# Patient Record
Sex: Female | Born: 1935 | Race: White | Hispanic: No | Smoking: Former smoker
Health system: Southern US, Community
[De-identification: ages and names within clinical notes are randomized; demographics above are authoritative.]

## PROBLEM LIST (undated history)

## (undated) DIAGNOSIS — F32A Depression, unspecified: Secondary | ICD-10-CM

## (undated) DIAGNOSIS — M1990 Unspecified osteoarthritis, unspecified site: Secondary | ICD-10-CM

## (undated) DIAGNOSIS — I10 Essential (primary) hypertension: Secondary | ICD-10-CM

## (undated) DIAGNOSIS — B029 Zoster without complications: Secondary | ICD-10-CM

## (undated) DIAGNOSIS — K56600 Partial intestinal obstruction, unspecified as to cause: Secondary | ICD-10-CM

## (undated) DIAGNOSIS — I639 Cerebral infarction, unspecified: Secondary | ICD-10-CM

## (undated) DIAGNOSIS — E785 Hyperlipidemia, unspecified: Secondary | ICD-10-CM

## (undated) DIAGNOSIS — F329 Major depressive disorder, single episode, unspecified: Secondary | ICD-10-CM

## (undated) DIAGNOSIS — E890 Postprocedural hypothyroidism: Secondary | ICD-10-CM

## (undated) DIAGNOSIS — R0789 Other chest pain: Secondary | ICD-10-CM

## (undated) DIAGNOSIS — R519 Headache, unspecified: Secondary | ICD-10-CM

## (undated) DIAGNOSIS — I499 Cardiac arrhythmia, unspecified: Secondary | ICD-10-CM

## (undated) DIAGNOSIS — Z9009 Acquired absence of other part of head and neck: Secondary | ICD-10-CM

## (undated) DIAGNOSIS — R0600 Dyspnea, unspecified: Secondary | ICD-10-CM

## (undated) DIAGNOSIS — K219 Gastro-esophageal reflux disease without esophagitis: Secondary | ICD-10-CM

## (undated) DIAGNOSIS — K56609 Unspecified intestinal obstruction, unspecified as to partial versus complete obstruction: Secondary | ICD-10-CM

## (undated) DIAGNOSIS — C50919 Malignant neoplasm of unspecified site of unspecified female breast: Secondary | ICD-10-CM

## (undated) DIAGNOSIS — Z972 Presence of dental prosthetic device (complete) (partial): Secondary | ICD-10-CM

## (undated) DIAGNOSIS — R51 Headache: Secondary | ICD-10-CM

## (undated) DIAGNOSIS — C4491 Basal cell carcinoma of skin, unspecified: Secondary | ICD-10-CM

## (undated) HISTORY — DX: Cerebral infarction, unspecified: I63.9

## (undated) HISTORY — DX: Dyspnea, unspecified: R06.00

## (undated) HISTORY — DX: Partial intestinal obstruction, unspecified as to cause: K56.600

## (undated) HISTORY — DX: Malignant neoplasm of unspecified site of unspecified female breast: C50.919

## (undated) HISTORY — DX: Acquired absence of other part of head and neck: Z90.09

## (undated) HISTORY — DX: Depression, unspecified: F32.A

## (undated) HISTORY — DX: Major depressive disorder, single episode, unspecified: F32.9

## (undated) HISTORY — DX: Other chest pain: R07.89

## (undated) HISTORY — DX: Gastro-esophageal reflux disease without esophagitis: K21.9

## (undated) HISTORY — DX: Unspecified intestinal obstruction, unspecified as to partial versus complete obstruction: K56.609

## (undated) HISTORY — PX: NECK SURGERY: SHX720

## (undated) HISTORY — DX: Unspecified osteoarthritis, unspecified site: M19.90

## (undated) HISTORY — PX: ABDOMINAL HYSTERECTOMY: SHX81

## (undated) HISTORY — DX: Essential (primary) hypertension: I10

## (undated) HISTORY — DX: Basal cell carcinoma of skin, unspecified: C44.91

## (undated) HISTORY — DX: Cardiac arrhythmia, unspecified: I49.9

## (undated) HISTORY — DX: Postprocedural hypothyroidism: E89.0

## (undated) HISTORY — DX: Zoster without complications: B02.9

## (undated) HISTORY — PX: BREAST SURGERY: SHX581

## (undated) HISTORY — DX: Hyperlipidemia, unspecified: E78.5

---

## 1978-07-09 HISTORY — PX: MASTECTOMY: SHX3

## 2002-07-09 LAB — HM COLONOSCOPY

## 2004-04-11 ENCOUNTER — Ambulatory Visit: Payer: Self-pay | Admitting: Pain Medicine

## 2004-04-19 ENCOUNTER — Ambulatory Visit: Payer: Self-pay | Admitting: Pain Medicine

## 2004-04-24 ENCOUNTER — Ambulatory Visit: Payer: Self-pay | Admitting: General Surgery

## 2004-06-08 ENCOUNTER — Ambulatory Visit: Payer: Self-pay | Admitting: Pain Medicine

## 2004-06-19 ENCOUNTER — Ambulatory Visit: Payer: Self-pay | Admitting: Pain Medicine

## 2004-07-21 ENCOUNTER — Inpatient Hospital Stay: Payer: Self-pay | Admitting: Internal Medicine

## 2005-04-03 ENCOUNTER — Ambulatory Visit: Payer: Self-pay | Admitting: Internal Medicine

## 2005-04-06 ENCOUNTER — Ambulatory Visit: Payer: Self-pay | Admitting: Internal Medicine

## 2005-05-09 ENCOUNTER — Ambulatory Visit: Payer: Self-pay | Admitting: Internal Medicine

## 2005-06-11 ENCOUNTER — Ambulatory Visit: Payer: Self-pay | Admitting: Internal Medicine

## 2005-07-11 ENCOUNTER — Ambulatory Visit: Payer: Self-pay | Admitting: Internal Medicine

## 2005-08-02 ENCOUNTER — Ambulatory Visit: Payer: Self-pay | Admitting: Pain Medicine

## 2005-08-06 ENCOUNTER — Ambulatory Visit: Payer: Self-pay | Admitting: Pain Medicine

## 2005-09-10 ENCOUNTER — Ambulatory Visit: Payer: Self-pay | Admitting: Internal Medicine

## 2005-09-13 ENCOUNTER — Ambulatory Visit: Payer: Self-pay | Admitting: Pain Medicine

## 2005-09-19 ENCOUNTER — Ambulatory Visit: Payer: Self-pay | Admitting: Pain Medicine

## 2005-10-03 ENCOUNTER — Ambulatory Visit: Payer: Self-pay | Admitting: Internal Medicine

## 2005-11-26 ENCOUNTER — Other Ambulatory Visit: Admission: RE | Admit: 2005-11-26 | Discharge: 2005-11-26 | Payer: Self-pay | Admitting: Internal Medicine

## 2005-11-26 ENCOUNTER — Ambulatory Visit: Payer: Self-pay | Admitting: Internal Medicine

## 2006-01-24 ENCOUNTER — Ambulatory Visit: Payer: Self-pay | Admitting: Internal Medicine

## 2006-02-12 ENCOUNTER — Ambulatory Visit: Payer: Self-pay | Admitting: Internal Medicine

## 2006-03-14 ENCOUNTER — Ambulatory Visit: Payer: Self-pay | Admitting: Internal Medicine

## 2006-04-15 ENCOUNTER — Ambulatory Visit: Payer: Self-pay | Admitting: Internal Medicine

## 2006-04-23 ENCOUNTER — Ambulatory Visit: Payer: Self-pay | Admitting: Internal Medicine

## 2006-05-13 ENCOUNTER — Ambulatory Visit: Payer: Self-pay | Admitting: Internal Medicine

## 2006-07-15 ENCOUNTER — Ambulatory Visit: Payer: Self-pay | Admitting: Internal Medicine

## 2006-08-15 ENCOUNTER — Ambulatory Visit: Payer: Self-pay | Admitting: Internal Medicine

## 2006-08-28 DIAGNOSIS — E785 Hyperlipidemia, unspecified: Secondary | ICD-10-CM | POA: Insufficient documentation

## 2006-08-28 DIAGNOSIS — M1990 Unspecified osteoarthritis, unspecified site: Secondary | ICD-10-CM | POA: Insufficient documentation

## 2006-08-28 DIAGNOSIS — Z853 Personal history of malignant neoplasm of breast: Secondary | ICD-10-CM

## 2006-08-28 DIAGNOSIS — K219 Gastro-esophageal reflux disease without esophagitis: Secondary | ICD-10-CM

## 2006-09-06 ENCOUNTER — Encounter: Payer: Self-pay | Admitting: Internal Medicine

## 2006-10-01 ENCOUNTER — Ambulatory Visit: Payer: Self-pay | Admitting: General Surgery

## 2006-10-15 ENCOUNTER — Encounter: Payer: Self-pay | Admitting: Internal Medicine

## 2006-10-31 ENCOUNTER — Ambulatory Visit: Payer: Self-pay | Admitting: Pain Medicine

## 2006-11-04 ENCOUNTER — Ambulatory Visit: Payer: Self-pay | Admitting: Pain Medicine

## 2006-11-04 ENCOUNTER — Encounter: Payer: Self-pay | Admitting: Internal Medicine

## 2006-11-14 ENCOUNTER — Ambulatory Visit: Payer: Self-pay | Admitting: Internal Medicine

## 2006-11-14 DIAGNOSIS — G479 Sleep disorder, unspecified: Secondary | ICD-10-CM | POA: Insufficient documentation

## 2006-11-14 DIAGNOSIS — H409 Unspecified glaucoma: Secondary | ICD-10-CM | POA: Insufficient documentation

## 2006-12-01 ENCOUNTER — Emergency Department (HOSPITAL_COMMUNITY): Admission: EM | Admit: 2006-12-01 | Discharge: 2006-12-01 | Payer: Self-pay | Admitting: Emergency Medicine

## 2006-12-06 ENCOUNTER — Ambulatory Visit: Payer: Self-pay | Admitting: Internal Medicine

## 2006-12-09 ENCOUNTER — Encounter: Payer: Self-pay | Admitting: Internal Medicine

## 2006-12-09 ENCOUNTER — Emergency Department (HOSPITAL_COMMUNITY): Admission: EM | Admit: 2006-12-09 | Discharge: 2006-12-09 | Payer: Self-pay | Admitting: Emergency Medicine

## 2006-12-10 ENCOUNTER — Ambulatory Visit: Payer: Self-pay | Admitting: Internal Medicine

## 2006-12-25 ENCOUNTER — Telehealth: Payer: Self-pay | Admitting: Family Medicine

## 2007-01-09 ENCOUNTER — Encounter (INDEPENDENT_AMBULATORY_CARE_PROVIDER_SITE_OTHER): Payer: Self-pay | Admitting: *Deleted

## 2007-01-13 ENCOUNTER — Encounter (INDEPENDENT_AMBULATORY_CARE_PROVIDER_SITE_OTHER): Payer: Self-pay | Admitting: *Deleted

## 2007-01-13 ENCOUNTER — Ambulatory Visit: Payer: Self-pay | Admitting: Pain Medicine

## 2007-01-20 ENCOUNTER — Ambulatory Visit: Payer: Self-pay | Admitting: Internal Medicine

## 2007-01-28 ENCOUNTER — Encounter: Payer: Self-pay | Admitting: Internal Medicine

## 2007-01-28 ENCOUNTER — Ambulatory Visit: Payer: Self-pay | Admitting: Pain Medicine

## 2007-02-03 ENCOUNTER — Encounter: Payer: Self-pay | Admitting: Internal Medicine

## 2007-02-03 ENCOUNTER — Ambulatory Visit: Payer: Self-pay | Admitting: Pain Medicine

## 2007-02-13 ENCOUNTER — Ambulatory Visit: Payer: Self-pay | Admitting: Pain Medicine

## 2007-02-13 ENCOUNTER — Encounter: Payer: Self-pay | Admitting: Internal Medicine

## 2007-02-14 ENCOUNTER — Telehealth: Payer: Self-pay | Admitting: Family Medicine

## 2007-02-14 ENCOUNTER — Inpatient Hospital Stay (HOSPITAL_COMMUNITY): Admission: EM | Admit: 2007-02-14 | Discharge: 2007-02-19 | Payer: Self-pay | Admitting: Emergency Medicine

## 2007-02-14 ENCOUNTER — Ambulatory Visit: Payer: Self-pay | Admitting: Internal Medicine

## 2007-02-16 ENCOUNTER — Encounter: Payer: Self-pay | Admitting: Internal Medicine

## 2007-02-18 ENCOUNTER — Encounter: Payer: Self-pay | Admitting: Internal Medicine

## 2007-02-19 ENCOUNTER — Encounter: Payer: Self-pay | Admitting: Internal Medicine

## 2007-02-28 ENCOUNTER — Ambulatory Visit: Payer: Self-pay | Admitting: Internal Medicine

## 2007-03-21 ENCOUNTER — Ambulatory Visit: Payer: Self-pay | Admitting: Internal Medicine

## 2007-03-31 ENCOUNTER — Ambulatory Visit: Payer: Self-pay | Admitting: Internal Medicine

## 2007-04-08 ENCOUNTER — Encounter: Payer: Self-pay | Admitting: Internal Medicine

## 2007-04-11 ENCOUNTER — Encounter: Payer: Self-pay | Admitting: Internal Medicine

## 2007-04-22 ENCOUNTER — Ambulatory Visit: Payer: Self-pay | Admitting: Internal Medicine

## 2007-04-23 ENCOUNTER — Encounter: Payer: Self-pay | Admitting: Internal Medicine

## 2007-05-20 ENCOUNTER — Telehealth (INDEPENDENT_AMBULATORY_CARE_PROVIDER_SITE_OTHER): Payer: Self-pay | Admitting: *Deleted

## 2007-06-03 ENCOUNTER — Encounter (INDEPENDENT_AMBULATORY_CARE_PROVIDER_SITE_OTHER): Payer: Self-pay | Admitting: *Deleted

## 2007-06-03 ENCOUNTER — Ambulatory Visit: Payer: Self-pay | Admitting: Internal Medicine

## 2007-06-11 ENCOUNTER — Telehealth (INDEPENDENT_AMBULATORY_CARE_PROVIDER_SITE_OTHER): Payer: Self-pay | Admitting: *Deleted

## 2007-06-26 ENCOUNTER — Ambulatory Visit: Payer: Self-pay | Admitting: Internal Medicine

## 2007-06-26 LAB — CONVERTED CEMR LAB
Blood in Urine, dipstick: NEGATIVE
Glucose, Urine, Semiquant: NEGATIVE
Ketones, urine, test strip: NEGATIVE
Nitrite: NEGATIVE
Protein, U semiquant: NEGATIVE

## 2007-07-14 ENCOUNTER — Telehealth (INDEPENDENT_AMBULATORY_CARE_PROVIDER_SITE_OTHER): Payer: Self-pay | Admitting: *Deleted

## 2007-07-17 ENCOUNTER — Ambulatory Visit: Payer: Self-pay | Admitting: Internal Medicine

## 2007-07-30 ENCOUNTER — Telehealth (INDEPENDENT_AMBULATORY_CARE_PROVIDER_SITE_OTHER): Payer: Self-pay | Admitting: *Deleted

## 2007-08-07 ENCOUNTER — Telehealth: Payer: Self-pay | Admitting: Internal Medicine

## 2007-08-11 ENCOUNTER — Telehealth (INDEPENDENT_AMBULATORY_CARE_PROVIDER_SITE_OTHER): Payer: Self-pay | Admitting: *Deleted

## 2007-08-14 ENCOUNTER — Encounter: Payer: Self-pay | Admitting: Internal Medicine

## 2007-08-18 ENCOUNTER — Telehealth (INDEPENDENT_AMBULATORY_CARE_PROVIDER_SITE_OTHER): Payer: Self-pay | Admitting: *Deleted

## 2007-09-05 ENCOUNTER — Telehealth (INDEPENDENT_AMBULATORY_CARE_PROVIDER_SITE_OTHER): Payer: Self-pay | Admitting: *Deleted

## 2007-09-15 ENCOUNTER — Encounter: Payer: Self-pay | Admitting: Internal Medicine

## 2007-09-16 ENCOUNTER — Telehealth: Payer: Self-pay | Admitting: Internal Medicine

## 2007-09-22 ENCOUNTER — Telehealth (INDEPENDENT_AMBULATORY_CARE_PROVIDER_SITE_OTHER): Payer: Self-pay | Admitting: *Deleted

## 2007-09-26 ENCOUNTER — Ambulatory Visit: Payer: Self-pay | Admitting: Internal Medicine

## 2007-11-19 ENCOUNTER — Telehealth: Payer: Self-pay | Admitting: Internal Medicine

## 2007-11-21 ENCOUNTER — Telehealth (INDEPENDENT_AMBULATORY_CARE_PROVIDER_SITE_OTHER): Payer: Self-pay | Admitting: *Deleted

## 2007-11-25 ENCOUNTER — Ambulatory Visit: Payer: Self-pay | Admitting: Internal Medicine

## 2007-11-25 DIAGNOSIS — B019 Varicella without complication: Secondary | ICD-10-CM | POA: Insufficient documentation

## 2007-11-25 DIAGNOSIS — B029 Zoster without complications: Secondary | ICD-10-CM

## 2007-12-09 ENCOUNTER — Telehealth (INDEPENDENT_AMBULATORY_CARE_PROVIDER_SITE_OTHER): Payer: Self-pay | Admitting: *Deleted

## 2007-12-24 ENCOUNTER — Telehealth: Payer: Self-pay | Admitting: Internal Medicine

## 2008-01-12 ENCOUNTER — Ambulatory Visit: Payer: Self-pay | Admitting: Internal Medicine

## 2008-01-13 ENCOUNTER — Telehealth (INDEPENDENT_AMBULATORY_CARE_PROVIDER_SITE_OTHER): Payer: Self-pay | Admitting: *Deleted

## 2008-01-14 LAB — CONVERTED CEMR LAB
ALT: 12 units/L (ref 0–35)
Albumin: 4 g/dL (ref 3.5–5.2)
Alkaline Phosphatase: 65 units/L (ref 39–117)
Basophils Relative: 3.1 % — ABNORMAL HIGH (ref 0.0–1.0)
Chloride: 101 meq/L (ref 96–112)
Creatinine, Ser: 1 mg/dL (ref 0.4–1.2)
Eosinophils Absolute: 0.1 10*3/uL (ref 0.0–0.7)
Eosinophils Relative: 1.6 % (ref 0.0–5.0)
GFR calc Af Amer: 69 mL/min
GFR calc non Af Amer: 57 mL/min
HCT: 37.5 % (ref 36.0–46.0)
MCV: 90.1 fL (ref 78.0–100.0)
Monocytes Absolute: 1 10*3/uL (ref 0.1–1.0)
Phosphorus: 5.3 mg/dL — ABNORMAL HIGH (ref 2.3–4.6)
Potassium: 4.4 meq/L (ref 3.5–5.1)
RBC: 4.17 M/uL (ref 3.87–5.11)
Total Protein: 7 g/dL (ref 6.0–8.3)
WBC: 8.1 10*3/uL (ref 4.5–10.5)

## 2008-01-21 ENCOUNTER — Encounter: Payer: Self-pay | Admitting: Internal Medicine

## 2008-03-08 ENCOUNTER — Encounter: Payer: Self-pay | Admitting: Internal Medicine

## 2008-03-12 ENCOUNTER — Ambulatory Visit: Payer: Self-pay | Admitting: Internal Medicine

## 2008-04-07 ENCOUNTER — Ambulatory Visit: Payer: Self-pay | Admitting: Internal Medicine

## 2008-04-20 ENCOUNTER — Telehealth: Payer: Self-pay | Admitting: Internal Medicine

## 2008-05-04 ENCOUNTER — Ambulatory Visit: Payer: Self-pay | Admitting: Family Medicine

## 2008-05-04 LAB — CONVERTED CEMR LAB
Bilirubin Urine: NEGATIVE
Urobilinogen, UA: 0.2
WBC Urine, dipstick: NEGATIVE
pH: 5.5

## 2008-05-06 ENCOUNTER — Encounter: Payer: Self-pay | Admitting: Family Medicine

## 2008-05-14 ENCOUNTER — Ambulatory Visit: Payer: Self-pay | Admitting: Internal Medicine

## 2008-05-14 ENCOUNTER — Telehealth: Payer: Self-pay | Admitting: Internal Medicine

## 2008-06-16 ENCOUNTER — Ambulatory Visit: Payer: Self-pay | Admitting: Internal Medicine

## 2008-06-16 DIAGNOSIS — F39 Unspecified mood [affective] disorder: Secondary | ICD-10-CM

## 2008-07-12 ENCOUNTER — Telehealth: Payer: Self-pay | Admitting: Internal Medicine

## 2008-07-26 ENCOUNTER — Telehealth: Payer: Self-pay | Admitting: Internal Medicine

## 2008-08-25 ENCOUNTER — Telehealth: Payer: Self-pay | Admitting: Internal Medicine

## 2008-09-24 ENCOUNTER — Ambulatory Visit: Payer: Self-pay | Admitting: Internal Medicine

## 2008-10-01 ENCOUNTER — Encounter: Payer: Self-pay | Admitting: Internal Medicine

## 2008-10-25 ENCOUNTER — Telehealth: Payer: Self-pay | Admitting: Internal Medicine

## 2008-11-09 ENCOUNTER — Telehealth: Payer: Self-pay | Admitting: Internal Medicine

## 2008-11-09 ENCOUNTER — Ambulatory Visit: Payer: Self-pay | Admitting: Internal Medicine

## 2008-11-09 LAB — CONVERTED CEMR LAB
Blood in Urine, dipstick: NEGATIVE
Glucose, Urine, Semiquant: NEGATIVE
Nitrite: NEGATIVE
Urobilinogen, UA: 0.2
WBC Urine, dipstick: NEGATIVE
pH: 6

## 2008-12-27 ENCOUNTER — Ambulatory Visit: Payer: Self-pay | Admitting: Internal Medicine

## 2008-12-30 ENCOUNTER — Telehealth (INDEPENDENT_AMBULATORY_CARE_PROVIDER_SITE_OTHER): Payer: Self-pay | Admitting: *Deleted

## 2008-12-31 ENCOUNTER — Telehealth (INDEPENDENT_AMBULATORY_CARE_PROVIDER_SITE_OTHER): Payer: Self-pay | Admitting: *Deleted

## 2009-02-17 ENCOUNTER — Ambulatory Visit: Payer: Self-pay | Admitting: Internal Medicine

## 2009-02-28 ENCOUNTER — Telehealth: Payer: Self-pay | Admitting: Internal Medicine

## 2009-04-04 ENCOUNTER — Ambulatory Visit: Payer: Self-pay | Admitting: Internal Medicine

## 2009-04-04 DIAGNOSIS — G629 Polyneuropathy, unspecified: Secondary | ICD-10-CM | POA: Insufficient documentation

## 2009-04-06 LAB — CONVERTED CEMR LAB
ALT: 16 units/L (ref 0–35)
AST: 20 units/L (ref 0–37)
Albumin: 3.8 g/dL (ref 3.5–5.2)
BUN: 17 mg/dL (ref 6–23)
Basophils Absolute: 0 10*3/uL (ref 0.0–0.1)
CO2: 31 meq/L (ref 19–32)
Calcium: 8.9 mg/dL (ref 8.4–10.5)
Chloride: 108 meq/L (ref 96–112)
Creatinine, Ser: 0.9 mg/dL (ref 0.4–1.2)
HCT: 37.8 % (ref 36.0–46.0)
Hemoglobin: 12.8 g/dL (ref 12.0–15.0)
Lymphs Abs: 2 10*3/uL (ref 0.7–4.0)
MCHC: 33.7 g/dL (ref 30.0–36.0)
MCV: 89.2 fL (ref 78.0–100.0)
Monocytes Absolute: 0.6 10*3/uL (ref 0.1–1.0)
Monocytes Relative: 9.7 % (ref 3.0–12.0)
Neutro Abs: 3.4 10*3/uL (ref 1.4–7.7)
Platelets: 175 10*3/uL (ref 150.0–400.0)
RDW: 13.3 % (ref 11.5–14.6)
TSH: 1.24 microintl units/mL (ref 0.35–5.50)
Total Bilirubin: 0.6 mg/dL (ref 0.3–1.2)
Vitamin B-12: 185 pg/mL — ABNORMAL LOW (ref 211–911)

## 2009-05-03 ENCOUNTER — Ambulatory Visit: Payer: Self-pay | Admitting: Internal Medicine

## 2009-05-03 DIAGNOSIS — E538 Deficiency of other specified B group vitamins: Secondary | ICD-10-CM

## 2009-05-04 LAB — CONVERTED CEMR LAB: Vitamin B-12: 568 pg/mL (ref 211–911)

## 2009-05-19 ENCOUNTER — Encounter: Payer: Self-pay | Admitting: Internal Medicine

## 2009-05-31 ENCOUNTER — Ambulatory Visit: Payer: Self-pay | Admitting: Internal Medicine

## 2009-06-03 ENCOUNTER — Ambulatory Visit: Payer: Self-pay | Admitting: Internal Medicine

## 2009-07-06 ENCOUNTER — Ambulatory Visit: Payer: Self-pay | Admitting: Internal Medicine

## 2009-07-07 ENCOUNTER — Telehealth: Payer: Self-pay | Admitting: Internal Medicine

## 2009-07-07 LAB — CONVERTED CEMR LAB
AST: 18 units/L (ref 0–37)
Albumin: 3.9 g/dL (ref 3.5–5.2)
Alkaline Phosphatase: 56 units/L (ref 39–117)
BUN: 13 mg/dL (ref 6–23)
Basophils Absolute: 0.1 10*3/uL (ref 0.0–0.1)
Bilirubin, Direct: 0 mg/dL (ref 0.0–0.3)
Chloride: 102 meq/L (ref 96–112)
Eosinophils Absolute: 0.1 10*3/uL (ref 0.0–0.7)
GFR calc non Af Amer: 85.59 mL/min (ref >60)
Glucose, Bld: 89 mg/dL (ref 70–99)
Hemoglobin: 12.1 g/dL (ref 12.0–15.0)
Lymphocytes Relative: 35.2 % (ref 12.0–46.0)
MCHC: 33.3 g/dL (ref 30.0–36.0)
Monocytes Relative: 6.9 % (ref 3.0–12.0)
Neutrophils Relative %: 56 % (ref 43.0–77.0)
Phosphorus: 4.2 mg/dL (ref 2.3–4.6)
Potassium: 4.1 meq/L (ref 3.5–5.1)
RDW: 13.4 % (ref 11.5–14.6)
Total Bilirubin: 0.7 mg/dL (ref 0.3–1.2)

## 2009-09-19 ENCOUNTER — Ambulatory Visit: Payer: Self-pay | Admitting: Internal Medicine

## 2009-09-19 ENCOUNTER — Telehealth: Payer: Self-pay | Admitting: Internal Medicine

## 2009-10-05 ENCOUNTER — Ambulatory Visit: Payer: Self-pay | Admitting: Internal Medicine

## 2009-10-14 ENCOUNTER — Telehealth: Payer: Self-pay | Admitting: Internal Medicine

## 2009-10-27 ENCOUNTER — Telehealth: Payer: Self-pay | Admitting: Internal Medicine

## 2009-11-03 ENCOUNTER — Ambulatory Visit: Payer: Self-pay | Admitting: Internal Medicine

## 2009-11-04 ENCOUNTER — Ambulatory Visit: Payer: Self-pay | Admitting: Cardiology

## 2009-11-09 ENCOUNTER — Encounter: Payer: Self-pay | Admitting: Cardiovascular Disease

## 2009-11-11 ENCOUNTER — Encounter: Payer: Self-pay | Admitting: Cardiology

## 2009-11-14 ENCOUNTER — Telehealth: Payer: Self-pay | Admitting: Internal Medicine

## 2009-11-21 ENCOUNTER — Telehealth (INDEPENDENT_AMBULATORY_CARE_PROVIDER_SITE_OTHER): Payer: Self-pay | Admitting: *Deleted

## 2009-11-22 ENCOUNTER — Encounter: Payer: Self-pay | Admitting: Cardiology

## 2009-11-22 ENCOUNTER — Encounter (HOSPITAL_COMMUNITY): Admission: RE | Admit: 2009-11-22 | Discharge: 2009-11-22 | Payer: Self-pay | Admitting: Cardiology

## 2009-11-22 ENCOUNTER — Telehealth (INDEPENDENT_AMBULATORY_CARE_PROVIDER_SITE_OTHER): Payer: Self-pay | Admitting: *Deleted

## 2009-11-22 ENCOUNTER — Encounter: Payer: Self-pay | Admitting: Internal Medicine

## 2009-11-22 ENCOUNTER — Telehealth: Payer: Self-pay | Admitting: Internal Medicine

## 2009-11-22 ENCOUNTER — Ambulatory Visit: Payer: Self-pay

## 2009-11-22 ENCOUNTER — Ambulatory Visit (HOSPITAL_COMMUNITY): Admission: RE | Admit: 2009-11-22 | Discharge: 2009-11-22 | Payer: Self-pay | Admitting: Cardiology

## 2009-11-22 ENCOUNTER — Emergency Department (HOSPITAL_COMMUNITY): Admission: EM | Admit: 2009-11-22 | Discharge: 2009-11-22 | Payer: Self-pay | Admitting: Family Medicine

## 2009-11-22 ENCOUNTER — Ambulatory Visit: Payer: Self-pay | Admitting: Cardiology

## 2009-11-24 ENCOUNTER — Ambulatory Visit: Payer: Self-pay | Admitting: Cardiology

## 2009-12-07 ENCOUNTER — Telehealth: Payer: Self-pay | Admitting: Internal Medicine

## 2009-12-20 ENCOUNTER — Telehealth: Payer: Self-pay | Admitting: Internal Medicine

## 2009-12-28 ENCOUNTER — Ambulatory Visit: Payer: Self-pay | Admitting: Internal Medicine

## 2010-01-02 ENCOUNTER — Telehealth: Payer: Self-pay | Admitting: Internal Medicine

## 2010-01-25 ENCOUNTER — Telehealth: Payer: Self-pay | Admitting: Internal Medicine

## 2010-02-07 ENCOUNTER — Telehealth: Payer: Self-pay | Admitting: Family Medicine

## 2010-03-11 ENCOUNTER — Telehealth: Payer: Self-pay | Admitting: Internal Medicine

## 2010-03-14 ENCOUNTER — Encounter: Payer: Self-pay | Admitting: Internal Medicine

## 2010-03-16 ENCOUNTER — Telehealth: Payer: Self-pay | Admitting: Internal Medicine

## 2010-04-06 ENCOUNTER — Ambulatory Visit: Payer: Self-pay | Admitting: Internal Medicine

## 2010-04-10 ENCOUNTER — Ambulatory Visit: Payer: Self-pay | Admitting: Internal Medicine

## 2010-04-10 DIAGNOSIS — R609 Edema, unspecified: Secondary | ICD-10-CM

## 2010-05-22 ENCOUNTER — Telehealth: Payer: Self-pay | Admitting: Internal Medicine

## 2010-05-31 ENCOUNTER — Ambulatory Visit: Payer: Self-pay | Admitting: Family Medicine

## 2010-06-06 ENCOUNTER — Telehealth: Payer: Self-pay | Admitting: Internal Medicine

## 2010-07-04 ENCOUNTER — Telehealth (INDEPENDENT_AMBULATORY_CARE_PROVIDER_SITE_OTHER): Payer: Self-pay | Admitting: *Deleted

## 2010-07-17 ENCOUNTER — Telehealth: Payer: Self-pay | Admitting: Internal Medicine

## 2010-07-17 ENCOUNTER — Other Ambulatory Visit: Payer: Self-pay | Admitting: Internal Medicine

## 2010-07-17 ENCOUNTER — Ambulatory Visit
Admission: RE | Admit: 2010-07-17 | Discharge: 2010-07-17 | Payer: Self-pay | Source: Home / Self Care | Attending: Internal Medicine | Admitting: Internal Medicine

## 2010-07-17 LAB — CBC WITH DIFFERENTIAL/PLATELET
Basophils Absolute: 0 10*3/uL (ref 0.0–0.1)
Basophils Relative: 0.4 % (ref 0.0–3.0)
Eosinophils Absolute: 0.1 10*3/uL (ref 0.0–0.7)
Eosinophils Relative: 1.4 % (ref 0.0–5.0)
HCT: 36.4 % (ref 36.0–46.0)
Hemoglobin: 12 g/dL (ref 12.0–15.0)
Lymphocytes Relative: 29.4 % (ref 12.0–46.0)
Lymphs Abs: 1.5 10*3/uL (ref 0.7–4.0)
MCHC: 33.1 g/dL (ref 30.0–36.0)
MCV: 88 fl (ref 78.0–100.0)
Monocytes Absolute: 0.4 10*3/uL (ref 0.1–1.0)
Monocytes Relative: 8.1 % (ref 3.0–12.0)
Neutro Abs: 3.1 10*3/uL (ref 1.4–7.7)
Neutrophils Relative %: 60.7 % (ref 43.0–77.0)
Platelets: 202 10*3/uL (ref 150.0–400.0)
RBC: 4.14 Mil/uL (ref 3.87–5.11)
RDW: 14.2 % (ref 11.5–14.6)
WBC: 5.1 10*3/uL (ref 4.5–10.5)

## 2010-07-17 LAB — RENAL FUNCTION PANEL
Albumin: 3.4 g/dL — ABNORMAL LOW (ref 3.5–5.2)
BUN: 19 mg/dL (ref 6–23)
CO2: 27 mEq/L (ref 19–32)
Calcium: 8.9 mg/dL (ref 8.4–10.5)
Chloride: 106 mEq/L (ref 96–112)
Creatinine, Ser: 0.8 mg/dL (ref 0.4–1.2)
GFR: 70.13 mL/min (ref >60.00)
Glucose, Bld: 91 mg/dL (ref 70–99)
Phosphorus: 4 mg/dL (ref 2.3–4.6)
Potassium: 4.9 mEq/L (ref 3.5–5.1)
Sodium: 142 mEq/L (ref 135–145)

## 2010-07-17 LAB — HEPATIC FUNCTION PANEL
ALT: 8 U/L (ref 0–35)
AST: 18 U/L (ref 0–37)
Albumin: 3.4 g/dL — ABNORMAL LOW (ref 3.5–5.2)
Alkaline Phosphatase: 71 U/L (ref 39–117)
Bilirubin, Direct: 0.1 mg/dL (ref 0.0–0.3)
Total Bilirubin: 0.5 mg/dL (ref 0.3–1.2)
Total Protein: 6.8 g/dL (ref 6.0–8.3)

## 2010-07-17 LAB — TSH: TSH: 1.46 u[IU]/mL (ref 0.35–5.50)

## 2010-08-08 NOTE — Progress Notes (Signed)
Summary: fentanyl patch   Phone Note Call from Patient Call back at Home Phone [phone]   Caller: Patient Call For: Cindee Salt MD Summary of Call: Patient calling to let you know that she is not using the fentanyl patches. She says that they irritate her skin and make her itch really bad. She wants to know if she could get somehting else called in to [employer] [facility].  Initial call taken by: Melody Comas,  January 25, 2010 10:50 AM  Follow-up for Phone Call        will need to go back on long acting narcotic pills I would recommend morphine now instead of the oxycontin since it is a generic and probably safer  Rx written for her--she will have to pick it up Follow-up by: Cindee Salt MD,  January 25, 2010 1:46 PM  Additional Follow-up for Phone Call Additional follow up Details #1::        Patient notified as instructed by telephone. Rx up front and ready for pickup. Additional Follow-up by: Sydell Axon LPN,  January 25, 2010 3:22 PM    New/Updated Medications: MORPHINE SULFATE CR 60 MG XR12H-TAB (MORPHINE SULFATE) 1 tab by mouth two times a day for chronic pain Prescriptions: MORPHINE SULFATE CR 60 MG XR12H-TAB (MORPHINE SULFATE) 1 tab by mouth two times a day for chronic pain  #60 x 0   Entered and Authorized by:   Cindee Salt MD   Signed by:   Cindee Salt MD on 01/25/2010   Method used:   Print then Give to Patient   RxID:   [id number]

## 2010-08-08 NOTE — Progress Notes (Signed)
Summary: refill request for vistaril  Phone Note Refill Request   Refills Requested: Medication #1:  HYDROXYZINE HCL 25 MG TABS 1-2 tablet by mouth three times a day   Last Refilled: 02/07/2010 [facility]  Initial call taken by: Lowella Petties CMA,  March 11, 2010 12:30 PM  Follow-up for Phone Call        refill done but okay to make it #11 refills Follow-up by: Cindee Salt MD,  March 14, 2010 8:09 AM  Additional Follow-up for Phone Call Additional follow up Details #1::        Rx faxed to pharmacy Additional Follow-up by: DeShannon Smith CMA Duncan Dull),  March 14, 2010 8:55 AM    Prescriptions: HYDROXYZINE HCL 25 MG TABS (HYDROXYZINE HCL) 1-2 tablet by mouth three times a day  #90 Tablet x 3   Entered by:   Mervin Hack CMA (AAMA)   Authorized by:   Cindee Salt MD   Signed by:   Mervin Hack CMA (AAMA) on 03/14/2010   Method used:   Electronically to        [facility]. [address] [phone]* (retail)       [address] [place], [place]  [address]       Ph: [phone]       Fax: [phone]   RxID:   [id number]

## 2010-08-08 NOTE — Progress Notes (Signed)
Summary: Reaction to Pain Patch  Phone Note Call from Patient Call back at Home Phone [phone]   Caller: Patient Call For: Cindee Salt MD Summary of Call: Patient says she had a reaction to the pain patch that was given.  (Itching, rash, etc.)  It had to be ordered at the pharmacy and she filled on Saturday and used it Saturday night and then had this rash, itching reaction.  Is there anything else she can use?  [employer]. Initial call taken by: Delilah Shan CMA Duncan Dull),  January 02, 2010 3:26 PM  Follow-up for Phone Call        nothing comparable would have to change back to pills have her try on a different spot. Make sure there is no soap or lotions underneath call if problem again (make sure she got the prior manufacturer's patch again---she had had a reaciton with a different fentanyl pathc) Cindee Salt MD  January 02, 2010 4:09 PM   spoke with [facility] pharmacist to get manf and  in march pt got SANDOZ, april was [facility], and June APOTEX. Pt got last refill from [facility] pharmacy, pharmacist there stated that pt is allergic to APOTEX and the one they filled was MYLIN. Pharmacist states that the Sandoz is the one she can use but it's no longer made.  DeShannon Katrinka Blazing CMA Duncan Dull)  January 02, 2010 5:39 PM   have her try the different spot and if not better, will have to change back to a long acting narcotic tablet---I would use morphine since it is generic (the oxycodone isn't generic now) Follow-up by: Cindee Salt MD,  January 02, 2010 5:46 PM  Additional Follow-up for Phone Call Additional follow up Details #1::        Patient notified, will call back if still causing rash and itching.  Additional Follow-up by: Melody Comas,  January 03, 2010 10:08 AM

## 2010-08-08 NOTE — Progress Notes (Signed)
Summary: regarding fentanyl patches  Phone Note Call from Patient Call back at Home Phone [phone]   Caller: Patient Call For: Elizabeth Salt MD Summary of Call: Pt says she cant use both the 75 and 25 mg fentanyl patches.  They caused her to feel nauseated.  She said that taking the oxycodone and using the 75 mg patch made her itch.  She is taking the oxycodone but doesnt want to use the patches.  Should she try just using the 25 mg patch?  Please advise. Initial call taken by: Lowella Petties CMA,  October 27, 2009 9:59 AM  Follow-up for Phone Call        okay to try just the 25 micrograms patch Can try to use 2 patches at a time and then we can increase the strength to the 50 micrograms  May give her some additional help in addition to the oxycodone  We may need to go back to the long acting oxycodone also unless she can tolerate at least 50 micrograms   Follow-up by: Elizabeth Salt MD,  October 27, 2009 1:09 PM  Additional Follow-up for Phone Call Additional follow up Details #1::        Spoke with patient and advised results. She will use 1 then try 2 to see how it works. Additional Follow-up by: Mervin Hack CMA Duncan Dull),  October 27, 2009 2:18 PM

## 2010-08-08 NOTE — Progress Notes (Signed)
Summary: Nuclear Pre-Procedure  Phone Note Outgoing Call   Call placed by: Milana Na, EMT-P,  Nov 21, 2009 3:22 PM Summary of Call: Reviewed information on Myoview Information Sheet (see scanned document for further details).  Spoke with patient.     Nuclear Med Background Indications for Stress Test: Evaluation for Ischemia     Symptoms: Chest Pain, DOE, Palpitations, Rapid HR, SOB    Nuclear Pre-Procedure Cardiac Risk Factors: Family History - CAD, History of Smoking, Lipids Height (in): 65.25  Nuclear Med Study Referring MD:  D.Mclean

## 2010-08-08 NOTE — Progress Notes (Signed)
Summary: refill requests for lorazepam, morphine  Phone Note Refill Request Call back at Home Phone [phone] Message from:  Patient  Refills Requested: Medication #1:  MORPHINE SULFATE CR 60 MG XR12H-TAB 1 tab by mouth two times a day for chronic pain.  Medication #2:  LORAZEPAM 1 MG TABS 1-2 at bedtime to help sleep Please call pt when ready.  Initial call taken by: Lowella Petties CMA,  March 16, 2010 11:16 AM  Follow-up for Phone Call        Rx written Follow-up by: Cindee Salt MD,  March 16, 2010 1:20 PM  Additional Follow-up for Phone Call Additional follow up Details #1::        Spoke with patient and advised rx ready for pick-up  Additional Follow-up by: Mervin Hack CMA Duncan Dull),  March 16, 2010 2:01 PM    New/Updated Medications: LORAZEPAM 1 MG TABS (LORAZEPAM) 1-2 at bedtime to help sleep Prescriptions: LORAZEPAM 1 MG TABS (LORAZEPAM) 1-2 at bedtime to help sleep  #60 x 3   Entered and Authorized by:   Cindee Salt MD   Signed by:   Cindee Salt MD on 03/16/2010   Method used:   Print then Give to Patient   RxID:   [id number] MORPHINE SULFATE CR 60 MG XR12H-TAB (MORPHINE SULFATE) 1 tab by mouth two times a day for chronic pain  #60 x 0   Entered and Authorized by:   Cindee Salt MD   Signed by:   Cindee Salt MD on 03/16/2010   Method used:   Print then Give to Patient   RxID:   [id number]

## 2010-08-08 NOTE — Progress Notes (Signed)
Summary: refill request for oxycodone  Phone Note Refill Request Call back at Home Phone [phone] Message from:  Patient  Refills Requested: Medication #1:  OXYCODONE-ACETAMINOPHEN 5-325 MG TABS 1-2 tabs by mouth three times a day as needed for severe pain Please call when ready.  Initial call taken by: Lowella Petties CMA,  December 20, 2009 12:07 PM  Follow-up for Phone Call        Rx written Follow-up by: Cindee Salt MD,  December 20, 2009 1:52 PM  Additional Follow-up for Phone Call Additional follow up Details #1::        Spoke with patient and advised rx ready for pick-up  Additional Follow-up by: Mervin Hack CMA Duncan Dull),  December 20, 2009 4:04 PM    Prescriptions: OXYCODONE-ACETAMINOPHEN 5-325 MG TABS (OXYCODONE-ACETAMINOPHEN) 1-2 tabs by mouth three times a day as needed for severe pain  #120 x 0   Entered and Authorized by:   Cindee Salt MD   Signed by:   Cindee Salt MD on 12/20/2009   Method used:   Print then Give to Patient   RxID:   [id number]

## 2010-08-08 NOTE — Assessment & Plan Note (Signed)
Summary: FLU SHOT/CLE  Nurse Visit   Allergies: 1)  ! Prilosec 2)  ! Topamax 3)  ! Lyrica 4)  ! Cymbalta  Immunizations Administered:  Influenza Vaccine # 1:    Vaccine Type: Fluvax MCR    Site: left deltoid    Mfr: GlaxoSmithKline    Dose: 0.5 ml    Route: IM    Given by: Mervin Hack CMA (AAMA)    Exp. Date: 01/06/2011    Lot #: [id number]    VIS given: 01/31/10 version given April 07, 2010.  Flu Vaccine Consent Questions:    Do you have a history of severe allergic reactions to this vaccine? no    Any prior history of allergic reactions to egg and/or gelatin? no    Do you have a sensitivity to the preservative Thimersol? no    Do you have a past history of Guillan-Barre Syndrome? no    Do you currently have an acute febrile illness? no    Have you ever had a severe reaction to latex? no    Vaccine information given and explained to patient? yes    Are you currently pregnant? no  Orders Added: 1)  Influenza Vaccine MCR [00025]

## 2010-08-08 NOTE — Progress Notes (Signed)
Summary: Rx Lorazepam  Phone Note Refill Request Call back at [phone] Message from:  [facility] on February 07, 2010 1:40 PM  Refills Requested: Medication #1:  LORAZEPAM 1 MG TABS 1-2 at bedtime to help sleep   Last Refilled: 01/03/2010 Received faxed refill request please advise.  Dr. Karle Starch patient.   Method Requested: Telephone to Pharmacy Initial call taken by: Linde Gillis CMA Duncan Dull),  February 07, 2010 1:41 PM  Follow-up for Phone Call        Rx called to pharmacy Follow-up by: Benny Lennert CMA Duncan Dull),  February 07, 2010 2:23 PM    Prescriptions: LORAZEPAM 1 MG TABS (LORAZEPAM) 1-2 at bedtime to help sleep  #60 x 0   Entered and Authorized by:   Kerby Nora MD   Signed by:   Kerby Nora MD on 02/07/2010   Method used:   Telephoned to ...       [facility] [address] [phone]* (retail)       [address] [place], [place]  [address]       Ph: [phone]       Fax: [phone]   RxID:   [id number]

## 2010-08-08 NOTE — Progress Notes (Signed)
Summary: refill request for fentanyl  Phone Note Refill Request Call back at Home Phone [phone] Message from:  Patient  Refills Requested: Medication #1:  FENTANYL 75 MCG/HR PT72 apply 1 patch every 3 days Please call pt when ready.  Initial call taken by: Lowella Petties CMA,  December 07, 2009 1:08 PM  Follow-up for Phone Call        Rx written Follow-up by: Cindee Salt MD,  December 07, 2009 1:35 PM  Additional Follow-up for Phone Call Additional follow up Details #1::        Spoke with patient and advised rx ready for pick-up  Additional Follow-up by: Mervin Hack CMA Duncan Dull),  December 07, 2009 2:49 PM    Prescriptions: FENTANYL 75 MCG/HR PT72 (FENTANYL) apply 1 patch every 3 days  #10 x 0   Entered and Authorized by:   Cindee Salt MD   Signed by:   Cindee Salt MD on 12/07/2009   Method used:   Print then Give to Patient   RxID:   [id number]

## 2010-08-08 NOTE — Progress Notes (Signed)
Summary: LORAZEPAM  Phone Note Refill Request Message from:  [phone] on September 19, 2009 11:29 AM  Refills Requested: Medication #1:  LORAZEPAM 1 MG TABS 1-2 at bedtime to help sleep   Last Refilled: 08/05/2009 [facility] Request    Method Requested: Telephone to Pharmacy Initial call taken by: Mervin Hack CMA Duncan Dull),  September 19, 2009 11:29 AM  Follow-up for Phone Call        Rx given at appt today Follow-up by: Cindee Salt MD,  September 19, 2009 1:35 PM

## 2010-08-08 NOTE — Progress Notes (Signed)
Summary: Allergies  Phone Note From Other Clinic   Caller: Dr.Portfillo @ [place] [phone] Call For: Dr.Letvak Summary of Call: MESSAGE LEFT ON MY VOICEMAIL:  [facility] calling to ask if pt's allergies to Lyrica and Cymbalta are true allergies, he would like to use one of these to treat pt for the shingles in her eye. I looked at both and Lyrica gives her nausea & vomiting and Cymbalta gives her SOB and made her lips swell. Per Dr. Earle Gell pt can't remember what the meds caused. Please advise. Initial call taken by: Mervin Hack CMA Duncan Dull),  May 22, 2010 9:23 AM  Follow-up for Phone Call        Neither of these meds should be used---she has true intolerance, if not allergy, to both of them Cindee Salt MD  May 22, 2010 10:14 AM   left message on voicemail with results, advised to call if any questions. Follow-up by: Mervin Hack CMA Duncan Dull),  May 22, 2010 3:33 PM

## 2010-08-08 NOTE — Progress Notes (Signed)
Summary: gabapentin on back order  Phone Note From Pharmacy   Caller: [facility] [address] [place] #[phone] Summary of Call: Gabapentin 600 mg is on back order and the pharmacy is asking if they can give two 300 mg twice a day and four at bedtime. Initial call taken by: Lowella Petties CMA,  Nov 14, 2009 8:26 AM  Follow-up for Phone Call        yes, that is fine Follow-up by: Cindee Salt MD,  Nov 14, 2009 1:52 PM  Additional Follow-up for Phone Call Additional follow up Details #1::        Pharmacist advised as instructed. Additional Follow-up by: Linde Gillis CMA Duncan Dull),  Nov 14, 2009 4:37 PM

## 2010-08-08 NOTE — Assessment & Plan Note (Signed)
Summary: ? SHINGLES   Vital Signs:  Patient profile:   77 year old female Height:      66 inches Weight:      123.25 pounds BMI:     19.96 Temp:     98.2 degrees F oral Pulse rate:   76 / minute Pulse rhythm:   regular BP sitting:   120 / 82 Cuff size:   regular  Vitals Entered By: Selena Batten Dance CMA Duncan Dull) (May 31, 2010 12:03 PM) CC: shingles, Back Pain   History of Present Illness: CC: ? shingles  6 years ago shingles in R eye and scalp (same as now).  1wk h/o break out in head, R scalp.  blisters, itchy.  taking oxycodone which helps a while.  No fevers/chills.  + blurry vision R eye (chronic since last shingles outbreak).  + HA which seem to be worsening over last several months.  + R ear discomfort as well but no hearing changes.  Seen ophthalmologist yesterday, started on lotemax.  they recommended she come here today.    taking acyclovir 800mg  qid for last 6 years (since last shingles outbreak).    Current Medications (verified): 1)  Nexium 40 Mg Cpdr (Esomeprazole Magnesium) .[name].. 1 Tab By Mouth Before Breakfast and At Bedtime 2)  Hydroxyzine Hcl 25 Mg Tabs (Hydroxyzine Hcl) .[name].. 1-2 Tablet By Mouth Three Times A Day 3)  Gabapentin 600 Mg Tabs (Gabapentin) .[name].. 1 Two Times A Day and 2 At Bedtime 4)  Lorazepam 1 Mg Tabs (Lorazepam) .[name].. 1-2 At Bedtime To Help Sleep 5)  Acyclovir 800 Mg  Tabs (Acyclovir) .[name].. 1 Tab Four Times Daily For Persistent Shingles 6)  Morphine Sulfate Cr 60 Mg Xr12h-Tab (Morphine Sulfate) .[name].. 1 Tab By Mouth Two Times A Day For Chronic Pain 7)  Oxycodone-Acetaminophen 5-325 Mg Tabs (Oxycodone-Acetaminophen) .[name].. 1-2 Tabs By Mouth Three Times A Day As Needed For Severe Pain 8)  Xibrom 0.09 % Soln (Bromfenac Sodium) .... Instill 1 Drop in Right Eye Two Times A Day 9)  Aspir-Low 81 Mg Tbec (Aspirin) .... One Tablet Daily  Allergies: 1)  ! Prilosec 2)  ! Topamax 3)  ! Lyrica 4)  ! Cymbalta  Past History:  Past Medical History: Last  updated: 11/24/2009 1. Breast cancer: Bilateral mastectomies 1986. 2. GERD 3. Hyperlipidemia 4. Osteoarthritis 5. Zoster with Post-herpetic neuralgia 6. Depression 7. History of partial thyroidectomy 8. Carotid dopplers (3/10) without significant disease.  9. Atypical chest pain: Lexiscan myoview (5/11) with EF 84%, normal wall motion, small fixed apical perfusion defect likely due to breast attenuation, no evidence for ischemia or infarction.  **Patient had an adverse reaction to Lexiscan and should not receive this or adenosine in the future** 10. Dyspnea: Echo (5/11) was a difficult study due to breast implants but showed normal LV and RV size and systolic function.    [facility] Dr Fransico Michael  [phone]  Social History: Last updated: 11/04/2009 Widowed-1996, lives in [place].  2 sons Retired-hosiery mill Never Smoked Alcohol use-no  Review of Systems       per HPI  Physical Exam  General:  alert and normal appearance.   Head:  right V1 area lesions Eyes:  cloudy cornea R eye, enlarged pupil.  L eye PERRLA, EOMI. Ears:  R ear normal and L ear normal.  no blisters in canals, no blisters on ear drum. No post auricular findings Nose:  External nasal examination shows no deformity or inflammation. Nasal mucosa are pink and moist without lesions or  exudates. Mouth:  MMM, no pharyngeal erythema Neck:  supple, no masses, and no cervical lymphadenopathy.   Lungs:  normal respiratory effort, no intercostal retractions, no accessory muscle use, and normal breath sounds.   Heart:  normal rate, regular rhythm, no murmur, and no gallop.   Skin:  3 denuded blisters R forehead, 1 on frontal scalp.  no erythema surrounding, no discharge or induration.   Impression & Recommendations:  Problem # 1:  SHINGLES, RECURRENT (ICD-053.9) reviewing chart, has had chronic recurrent shingles outbreaks despite daily use of acyclovir, also with post herpetic neuralgia.  Currently on high dose  gabapentin, intolerant/allergic to lyrica and cymbalta in past.  on longacting and breaktrhough oral narcotics.  has been on fentanyl patch in past.  difficult situation, wouldn't want to use capsacin on face.  unsure where else to go from here.  ? neuro c/s for any other treatments.  refilled percocet scripts per patient request.  Last filled early Oct.  saw ophtho yesterday who recommended pt be seen here.   ongoing problem, defer to PCP.  Problem # 2:  POSTHERPETIC NEURALGIA (ICD-053.19) see above.  Complete Medication List: 1)  Nexium 40 Mg Cpdr (Esomeprazole magnesium) .[name].. 1 tab by mouth before breakfast and at bedtime 2)  Hydroxyzine Hcl 25 Mg Tabs (Hydroxyzine hcl) .[name].. 1-2 tablet by mouth three times a day 3)  Gabapentin 600 Mg Tabs (Gabapentin) .[name].. 1 two times a day and 2 at bedtime 4)  Lorazepam 1 Mg Tabs (Lorazepam) .[name].. 1-2 at bedtime to help sleep 5)  Acyclovir 800 Mg Tabs (Acyclovir) .[name].. 1 tab four times daily for persistent shingles 6)  Morphine Sulfate Cr 60 Mg Xr12h-tab (Morphine sulfate) .[name].. 1 tab by mouth two times a day for chronic pain 7)  Oxycodone-acetaminophen 5-325 Mg Tabs (Oxycodone-acetaminophen) .[name].. 1-2 tabs by mouth three times a day as needed for severe pain 8)  Xibrom 0.09 % Soln (Bromfenac sodium) .... Instill 1 drop in right eye two times a day 9)  Aspir-low 81 Mg Tbec (Aspirin) .... One tablet daily  Patient Instructions: 1)  Start the lotemax prescribed by ophthalmology. 2)  refilled oxycodone/acetaminophen. 3)  If you start having hearing changes or worsening pain, please let us know, you may need to return to be seen.  Otherwise, continue current meds (percocets, gabapentin, and morphine XR twice daily.) 4)  Call clinic with questions.  Good to see you today. Prescriptions: OXYCODONE-ACETAMINOPHEN 5-325 MG TABS (OXYCODONE-ACETAMINOPHEN) 1-2 tabs by mouth three times a day as needed for severe pain  #120 x 0   Entered and Authorized by:   Eustaquio Boyden  MD   Signed by:   Eustaquio Boyden  MD on 05/31/2010   Method used:   Print then Give to Patient   RxID:   [id number]    Orders Added: 1)  Est. Patient Level III [14782]    Current Allergies (reviewed today): ! PRILOSEC ! TOPAMAX ! LYRICA ! CYMBALTA

## 2010-08-08 NOTE — Letter (Signed)
Summary: [facility]  [facility]   Imported By: Sherian Rein 03/28/2010 14:57:04  _____________________________________________________________________  External Attachment:    Type:   Image     Comment:   External Document  Appended Document: [facility] stable carotid ultrasound

## 2010-08-08 NOTE — Assessment & Plan Note (Signed)
Summary: FEET ARE SWELLING/JRR   Vital Signs:  Patient profile:   77 year old female Weight:      117 pounds Temp:     98.5 degrees F oral Pulse rate:   76 / minute Pulse rhythm:   regular BP sitting:   120 / 80  (left arm) Cuff size:   regular  Vitals Entered By: Mervin Hack CMA [facility]) (April 10, 2010 10:19 AM) CC: feet swelling/ rash on face   History of Present Illness: "Not doing too well"  Has been having some feet and leg swelling since lexiscan test This was in May Had fallen the day before the test and hurt her ankle--right Generally swell as the day goes on Doesn't add salt to her food No sig SOB except with activity---no recent change Uses 2 pillows-- no change No PND No chest pain--just notes "tightness" (which prompted heart testing)  still with shingles breaking out can't even comb her hair Ran out of oxycodone---got confused about how to use them  Mood is okay still with anxiety  Allergies: 1)  ! Prilosec 2)  ! Topamax 3)  ! Lyrica 4)  ! Cymbalta  Past History:  Past medical, surgical, family and social histories (including risk factors) reviewed for relevance to current acute and chronic problems.  Past Medical History: Reviewed history from 11/24/2009 and no changes required. 1. Breast cancer: Bilateral mastectomies 1986. 2. GERD 3. Hyperlipidemia 4. Osteoarthritis 5. Zoster with Post-herpetic neuralgia 6. Depression 7. History of partial thyroidectomy 8. Carotid dopplers (3/10) without significant disease.  9. Atypical chest pain: Lexiscan myoview (5/11) with EF 84%, normal wall motion, small fixed apical perfusion defect likely due to breast attenuation, no evidence for ischemia or infarction.  **Patient had an adverse reaction to Lexiscan and should not receive this or adenosine in the future** 10. Dyspnea: Echo (5/11) was a difficult study due to breast implants but showed normal LV and RV size and systolic function.     [facility] Dr Fransico Michael  [phone]  Past Surgical History: Reviewed history from 09/06/2006 and no changes required. Hysterectomy 1972 Mastectomy bilateral--1980's Thyroidectomy-partial 6/03 Evette Cristal) Vaginal deliveries 1952/1954  Family History: Reviewed history from 08/28/2006 and no changes required. Family History of Aneurysm Aortic-Dad Family History of CAD Female 1st degree relative <60--Mom,sons Family History Diabetes 1st degree relative--son Family History Hypertension--son  Social History: Reviewed history from 11/04/2009 and no changes required. Widowed-1996, lives in [place].  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   Head:  still with right V1 area lesions Neck:  supple, no masses, and no cervical lymphadenopathy.   Lungs:  normal respiratory effort, no intercostal retractions, no accessory muscle use, and normal breath sounds.   Heart:  normal rate, regular rhythm, no murmur, and no gallop.   Extremities:  no sig edema---perhaps just a trace at heels Psych:  normally interactive, good eye contact, not anxious appearing, and not depressed appearing.     Impression & Recommendations:  Problem # 1:  EDEMA- LOCALIZED (ICD-782.3) Assessment New probably just mild venous insuff discussed elevation and support hose as needed   Problem # 2:  SHINGLES, RECURRENT (ICD-053.9) Assessment: Unchanged ongoing issues will continue the narcotic meds and gabapentin, etc for this  Problem # 3:  SLEEP DISORDER (ICD-780.50) Assessment:  Unchanged due to ongoing pain okay with the lorazepam  Complete Medication List: 1)  Nexium 40 Mg Cpdr (Esomeprazole magnesium) .[name].. 1 tab by mouth before breakfast and at bedtime 2)  Hydroxyzine Hcl 25 Mg Tabs (Hydroxyzine hcl)  .[name].. 1-2 tablet by mouth three times a day 3)  Gabapentin 600 Mg Tabs (Gabapentin) .[name].. 1 two times a day and 2 at bedtime 4)  Lorazepam 1 Mg Tabs (Lorazepam) .[name].. 1-2 at bedtime to help sleep 5)  Acyclovir 800 Mg Tabs (Acyclovir) .[name].. 1 tab four times daily for persistent shingles 6)  Morphine Sulfate Cr 60 Mg Xr12h-tab (Morphine sulfate) .[name].. 1 tab by mouth two times a day for chronic pain 7)  Oxycodone-acetaminophen 5-325 Mg Tabs (Oxycodone-acetaminophen) .[name].. 1-2 tabs by mouth three times a day as needed for severe pain 8)  Xibrom 0.09 % Soln (Bromfenac sodium) .... Instill 1 drop in right eye two times a day 9)  Aspir-low 81 Mg Tbec (Aspirin) .... One tablet daily  Patient Instructions: 1)  Please schedule a follow-up appointment in 3 months ---please cancel Oct 17th appt Prescriptions: MORPHINE SULFATE CR 60 MG XR12H-TAB (MORPHINE SULFATE) 1 tab by mouth two times a day for chronic pain  #60 x 0   Entered and Authorized by:   Cindee Salt MD   Signed by:   Cindee Salt MD on 04/10/2010   Method used:   Print then Give to Patient   RxID:   [id number] OXYCODONE-ACETAMINOPHEN 5-325 MG TABS (OXYCODONE-ACETAMINOPHEN) 1-2 tabs by mouth three times a day as needed for severe pain  #120 x 0   Entered and Authorized by:   Cindee Salt MD   Signed by:   Cindee Salt MD on 04/10/2010   Method used:   Print then Give to Patient   RxID:   [id number]   Current Allergies (reviewed today): ! PRILOSEC ! TOPAMAX ! LYRICA ! CYMBALTA

## 2010-08-08 NOTE — Progress Notes (Signed)
Summary: needs refill on oxycodone  Phone Note Refill Request Call back at Home Phone [phone] Message from:  Patient  Refills Requested: Medication #1:  OXYCODONE-ACETAMINOPHEN 5-325 MG TABS 1-2 tabs by mouth three times a day as needed for severe pain Please call pt when ready.  She says the patches arent working as well for her as she thought they would, so she needs her pills.  Initial call taken by: Lowella Petties CMA,  October 14, 2009 2:06 PM  Follow-up for Phone Call        okay to fill the pills Let her know if she needs more than 2 of these a day regularly, I wil increase the strength of the patch Follow-up by: Cindee Salt MD,  October 14, 2009 2:15 PM  Additional Follow-up for Phone Call Additional follow up Details #1::        spoke with patient and she states that sometimes she takes 3 tablets a day, she also states the patches are not working as good as they did when she first started, can she put 2 on at a time? pt states she has 2 boxes of the patches. DeShannon Smith CMA [facility])  October 14, 2009 2:20 PM   We will add 25 micrograms patch to the current ones. If that controls the pain okay--will just change to 100 micrograms patches. If no better after 1-2 weeks using both the 75 and 25, I will increase further Additional Follow-up by: Cindee Salt MD,  October 14, 2009 3:40 PM    Additional Follow-up for Phone Call Additional follow up Details #2::    spoke with patient and advised results, pt will come by on Monday and pick both rx's up. Pt would like to talk to [facility] to get further instructions. Follow-up by: Mervin Hack CMA [facility]ull),  October 14, 2009 4:14 PM  New/Updated Medications: OXYCODONE-ACETAMINOPHEN 5-325 MG TABS (OXYCODONE-ACETAMINOPHEN) 1-2 tabs by mouth three times a day as needed for severe pain FENTANYL 25 MCG/HR PT72 (FENTANYL) apply 1 patch along with 75 micrograms patch every 3 days Prescriptions: FENTANYL 25 MCG/HR PT72 (FENTANYL) apply  1 patch along with 75 micrograms patch every 3 days  #10 x 0   Entered and Authorized by:   Cindee Salt MD   Signed by:   Cindee Salt MD on 10/14/2009   Method used:   Print then Give to Patient   RxID:   [id number] OXYCODONE-ACETAMINOPHEN 5-325 MG TABS (OXYCODONE-ACETAMINOPHEN) 1-2 tabs by mouth three times a day as needed for severe pain  #120 x 0   Entered and Authorized by:   Cindee Salt MD   Signed by:   Cindee Salt MD on 10/14/2009   Method used:   Print then Give to Patient   RxID:   [id number]

## 2010-08-08 NOTE — Assessment & Plan Note (Signed)
Summary: 3 M F/U DLO   Vital Signs:  Patient profile:   77 year old female Weight:      118 pounds Temp:     98.1 degrees F oral Pulse rate:   72 / minute Pulse rhythm:   regular BP sitting:   110 / 76  (left arm) Cuff size:   regular  Vitals Entered By: Mervin Hack CMA [facility]) (October 05, 2009 10:53 AM) CC: 3 month follow-up   History of Present Illness: Generally doing better Likes the patch for the 1st 2 days Pain seems worse on the 3rd day and she needs oxycodone frequently  did have a breakout by her eye this AM  Burning in mouth is better avoiding eating in evening etc   Allergies: 1)  ! Prilosec 2)  ! Topamax 3)  ! Lyrica 4)  ! Cymbalta  Past History:  Past medical, surgical, family and social histories (including risk factors) reviewed for relevance to current acute and chronic problems.  Past Medical History: Reviewed history from 06/16/2008 and no changes required. Breast cancer, hx of GERD Hyperlipidemia Osteoarthritis Post-herpetic neuralgia Depression  [facility] Dr Fransico Michael  [phone]  Past Surgical History: Reviewed history from 09/06/2006 and no changes required. Hysterectomy 1972 Mastectomy bilateral--1980's Thyroidectomy-partial 6/03 Evette Cristal) Vaginal deliveries 1952/1954  Family History: Reviewed history from 08/28/2006 and no changes required. Family History of Aneurysm Aortic-Dad Family History of CAD Female 1st degree relative <60--Mom,sons Family History Diabetes 1st degree relative--son Family History Hypertension--son  Social History: Reviewed history from 08/28/2006 and no changes required. 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  NAD Skin:  only 1 lesion on head forehead clear   Impression & Recommendations:  Problem # 1:  POSTHERPETIC NEURALGIA (ICD-053.19) Assessment  Improved better on patch but it wears out will change to every 2 days oxycodone as needed still same other meds  Complete Medication List: 1)  Nexium 40 Mg Cpdr (Esomeprazole magnesium) .[name].. 1 tab by mouth before breakfast and at bedtime 2)  Hydroxyzine Hcl 25 Mg Tabs (Hydroxyzine hcl) .[name].. 1-2 tablet by mouth three times a day 3)  Gabapentin 600 Mg Tabs (Gabapentin) .[name].. 1 two times a day and 2 at bedtime 4)  Lorazepam 1 Mg Tabs (Lorazepam) .[name].. 1-2 at bedtime to help sleep 5)  Acyclovir 800 Mg Tabs (Acyclovir) .[name].. 1 tab four times daily for persistent shingles 6)  Zovirax 5 % Oint (Acyclovir) .... Apply to shingles sores every 2 hours as needed 7)  Xibrom 0.09 % Soln (Bromfenac sodium) .... Instill 1 drop in right eye two times a day 8)  Oxycodone-acetaminophen 5-325 Mg Tabs (Oxycodone-acetaminophen) .[name].. 1-2 tabs by mouth three times a day as needed for severe pain 9)  Fentanyl 75 Mcg/hr Pt72 (Fentanyl) .... Apply 1 patch every 2 days  Patient Instructions: 1)  Please schedule a follow-up appointment in 4 months .  Prescriptions: FENTANYL 75 MCG/HR PT72 (FENTANYL) apply 1 patch every 2 days  #15 x 0   Entered and Authorized by:   Cindee Salt MD   Signed by:   Cindee Salt MD on 10/05/2009   Method used:   Print then Give to Patient   RxID:   [id number]   Current Allergies (reviewed today): ! PRILOSEC ! TOPAMAX ! LYRICA ! CYMBALTA

## 2010-08-08 NOTE — Assessment & Plan Note (Signed)
Summary: follow up/alc   Vital Signs:  Patient profile:   77 year old female Weight:      112 pounds Temp:     98.3 degrees F oral BP sitting:   110 / 60  (left arm) Cuff size:   regular  Vitals Entered By: Mervin Hack CMA [facility]ull) (December 28, 2009 12:13 PM) CC: follow-up visit   History of Present Illness: Had cardiology evaluation bad reaction to lexiscan but stress test and echo were reassuring no more chest pain but occ gets some upper chest tightness tongue coated in AM Feels like she has a knot in throat---despite the nexium two times a day  gets hoarse at times some dysphagia---can't eat hamburger for example  using the patches still has ongoing pain Noted  itching with a different patch manufacturer--couldn't use these  breaking out again in V1 on the right ongoing throbbing  Allergies: 1)  ! Prilosec 2)  ! Topamax 3)  ! Lyrica 4)  ! Cymbalta  Past History:  Past medical, surgical, family and social histories (including risk factors) reviewed for relevance to current acute and chronic problems.  Past Medical History: Reviewed history from 11/24/2009 and no changes required. 1. Breast cancer: Bilateral mastectomies 1986. 2. GERD 3. Hyperlipidemia 4. Osteoarthritis 5. Zoster with Post-herpetic neuralgia 6. Depression 7. History of partial thyroidectomy 8. Carotid dopplers (3/10) without significant disease.  9. Atypical chest pain: Lexiscan myoview (5/11) with EF 84%, normal wall motion, small fixed apical perfusion defect likely due to breast attenuation, no evidence for ischemia or infarction.  **Patient had an adverse reaction to Lexiscan and should not receive this or adenosine in the future** 10. Dyspnea: Echo (5/11) was a difficult study due to breast implants but showed normal LV and RV size and systolic function.    [facility] Dr Fransico Michael  [phone]  Past Surgical History: Reviewed history from 09/06/2006 and no changes required. Hysterectomy  1972 Mastectomy bilateral--1980's Thyroidectomy-partial 6/03 Evette Cristal) Vaginal deliveries 1952/1954  Family History: Reviewed history from 08/28/2006 and no changes required. Family History of Aneurysm Aortic-Dad Family History of CAD Female 1st degree relative <60--Mom,sons Family History Diabetes 1st degree relative--son Family History Hypertension--son  Social History: Reviewed history from 11/04/2009 and no changes required. Widowed-1996, lives in [place].  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  NAD Head:  active lesions on right forehead and scalp Neck:  supple, no masses, no thyromegaly, no carotid bruits, and no cervical lymphadenopathy.   Lungs:  normal respiratory effort and normal breath sounds.   Heart:  normal rate, regular rhythm, no murmur, and no gallop.   Abdomen:  soft, non-tender, and no masses.   Psych:  normally interactive and good eye contact.     Impression & Recommendations:  Problem # 1:  SHINGLES, RECURRENT (ICD-053.9) Assessment Unchanged ongoing pain issues on antiviral needs to coordinate proper manufacturer for fentanyl --new Rx given  Problem # 2:  GERD (ICD-530.81) Assessment: Deteriorated having some dysphagia discussed referral for EGD and dilation--she wants to wail  Her updated medication list for this problem includes:    Nexium 40 Mg Cpdr (Esomeprazole magnesium) .[name]... 1 tab by mouth before breakfast and at bedtime  Problem # 3:  DEPRESSION (ICD-311) Assessment: Unchanged still just related to pain no other changes  Her updated medication list for this problem includes:  Hydroxyzine Hcl 25 Mg Tabs (Hydroxyzine hcl) .[name]... 1-2 tablet by mouth three times a day    Lorazepam 1 Mg Tabs (Lorazepam) .[name]... 1-2 at bedtime to help sleep  Complete Medication List: 1)  Nexium 40 Mg  Cpdr (Esomeprazole magnesium) .[name].. 1 tab by mouth before breakfast and at bedtime 2)  Hydroxyzine Hcl 25 Mg Tabs (Hydroxyzine hcl) .[name].. 1-2 tablet by mouth three times a day 3)  Gabapentin 600 Mg Tabs (Gabapentin) .[name].. 1 two times a day and 2 at bedtime 4)  Lorazepam 1 Mg Tabs (Lorazepam) .[name].. 1-2 at bedtime to help sleep 5)  Acyclovir 800 Mg Tabs (Acyclovir) .[name].. 1 tab four times daily for persistent shingles 6)  Xibrom 0.09 % Soln (Bromfenac sodium) .... Instill 1 drop in right eye two times a day 7)  Oxycodone-acetaminophen 5-325 Mg Tabs (Oxycodone-acetaminophen) .[name].. 1-2 tabs by mouth three times a day as needed for severe pain 8)  Fentanyl 75 Mcg/hr Pt72 (Fentanyl) .... Apply 1 patch every 3 days 9)  Aspir-low 81 Mg Tbec (Aspirin) .... One tablet daily  Patient Instructions: 1)  Please schedule a follow-up appointment in 4 months---cancel other appts Prescriptions: FENTANYL 75 MCG/HR PT72 (FENTANYL) apply 1 patch every 3 days  #10 x 0   Entered and Authorized by:   Cindee Salt MD   Signed by:   Cindee Salt MD on 12/28/2009   Method used:   Print then Give to Patient   RxID:   [id number]   Current Allergies (reviewed today): ! PRILOSEC ! TOPAMAX ! LYRICA ! CYMBALTA

## 2010-08-08 NOTE — Assessment & Plan Note (Signed)
Summary: REFLUX, SHINGLES PAIN   Vital Signs:  Patient profile:   77 year old female Weight:      117 pounds O2 Sat:      100 % on Room air Temp:     98.8 degrees F oral Pulse rate:   76 / minute Pulse rhythm:   regular Resp:     20 per minute BP sitting:   140 / 80  (left arm) Cuff size:   regular  Vitals Entered By: Mervin Hack CMA [facility]) (September 19, 2009 12:03 PM)  O2 Flow:  Room air CC: reflux/ pain from shingles   History of Present Illness: Got sick last night Mouth full "of hot water" Actually burned her lips has had this intermittently for some time---mostly at night Tried milk with some success last night seems to make her heart race and gets her short winded   went to see Dr Dwaine Deter He diagnosed rash as psoriasis gave rash to dry it up but pain persists Gets itching with the oxycodone  Has been busy just moved to [facility] apt[facility] ([place])  Mood still affected  Anxious about ongoing shingles and the pain  Allergies: 1)  ! Prilosec 2)  ! Topamax 3)  ! Lyrica 4)  ! Cymbalta  Past History:  Past medical, surgical, family and social histories (including risk factors) reviewed for relevance to current acute and chronic problems.  Past Medical History: Reviewed history from 06/16/2008 and no changes required. Breast cancer, hx of GERD Hyperlipidemia Osteoarthritis Post-herpetic neuralgia Depression  [facility] Dr Fransico Michael  [phone]  Past Surgical History: Reviewed history from 09/06/2006 and no changes required. Hysterectomy 1972 Mastectomy bilateral--1980's Thyroidectomy-partial 6/03 Evette Cristal) Vaginal deliveries 1952/1954  Family History: Reviewed history from 08/28/2006 and no changes required. Family History of Aneurysm Aortic-Dad Family History of CAD Female 1st degree relative <60--Mom,sons Family History Diabetes 1st degree relative--son Family History Hypertension--son  Social History: Reviewed history from 08/28/2006 and  no changes required. 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  NAD mildly uncomfortable appearing Head:  normocephalic and atraumatic.   Lungs:  normal respiratory effort and normal breath sounds.   Heart:  normal rate, regular rhythm, no murmur, and no gallop.   Abdomen:  soft, non-tender, and no masses.     Impression & Recommendations:  Problem # 1:  GERD (ICD-530.81) Assessment Deteriorated will increase nexium to two times a day no eating before bedtime elevate HOB GI eval if persists  Her updated medication list for this problem includes:    Nexium 40 Mg Cpdr (Esomeprazole magnesium) .[name]... 1 tab by mouth before breakfast and at bedtime  Problem # 2:  DEPRESSION (ICD-311) Assessment: Comment Only ongoing due to pain problems will renew lorazepam  Her updated medication list for this problem includes:    Hydroxyzine Hcl 25 Mg Tabs (Hydroxyzine hcl) .[name]... 1-2 tablet by mouth three times a day    Lorazepam 1 Mg Tabs (Lorazepam) .[name]... 1-2 at bedtime to help sleep  Problem # 3:  POSTHERPETIC NEURALGIA (ICD-053.19) Assessment: Deteriorated having itching now with high doses of oxycodone will change to fentanyl patch but continue oxycodone for short acting  Complete Medication List: 1)  Nexium 40 Mg Cpdr (Esomeprazole magnesium) .[name].. 1 tab by mouth before breakfast and at bedtime 2)  Hydroxyzine Hcl 25 Mg Tabs (Hydroxyzine hcl) .[name].. 1-2 tablet by mouth three times a day 3)  Gabapentin 600 Mg Tabs (Gabapentin) .[name].. 1 two times a  day and 2 at bedtime 4)  Lorazepam 1 Mg Tabs (Lorazepam) .[name].. 1-2 at bedtime to help sleep 5)  Acyclovir 800 Mg Tabs (Acyclovir) .[name].. 1 tab four times daily for persistent shingles 6)  Zovirax 5 % Oint (Acyclovir) .... Apply to shingles sores every 2 hours as needed 7)  Xibrom 0.09 % Soln (Bromfenac sodium) .... Instill 1 drop in right eye two times a day 8)  Oxycodone-acetaminophen  5-325 Mg Tabs (Oxycodone-acetaminophen) .[name].. 1-2 tabs by mouth three times a day as needed for severe pain 9)  Fentanyl 75 Mcg/hr Pt72 (Fentanyl) .... Apply 1 patch every 3 days  Patient Instructions: 1)  Please do not eat within 2 hours of bedtime 2)  Please elevate the head of your bed with bricks or blocks under the bedposts 3)  Please start the pain patch fentanyl. Take 2 more oxycodone long acting after starting the patch and then stop 4)  Continue to use the short acting oxycodone as needed 5)  Call if the pain is not reasonably controlled on the patch or if you feel sedated 6)  Please keep your follow up appt Prescriptions: LORAZEPAM 1 MG TABS (LORAZEPAM) 1-2 at bedtime to help sleep  #60 x 3   Entered and Authorized by:   Cindee Salt MD   Signed by:   Cindee Salt MD on 09/19/2009   Method used:   Print then Give to Patient   RxID:   [id number] FENTANYL 75 MCG/HR PT72 (FENTANYL) apply 1 patch every 3 days  #10 x 0   Entered and Authorized by:   Cindee Salt MD   Signed by:   Cindee Salt MD on 09/19/2009   Method used:   Print then Give to Patient   RxID:   [id number] NEXIUM 40 MG CPDR (ESOMEPRAZOLE MAGNESIUM) 1 tab by mouth before breakfast and at bedtime  #60 x 12   Entered and Authorized by:   Cindee Salt MD   Signed by:   Cindee Salt MD on 09/19/2009   Method used:   Print then Give to Patient   RxID:   [id number]   Current Allergies (reviewed today): ! PRILOSEC ! TOPAMAX ! LYRICA ! CYMBALTA

## 2010-08-08 NOTE — Assessment & Plan Note (Signed)
Summary: RASH ON FACE, DISCUSS PAIN MED   Vital Signs:  Patient profile:   77 year old female Weight:      111 pounds Temp:     98.5 degrees F oral Pulse rate:   72 / minute Pulse rhythm:   regular BP sitting:   140 / 80  (left arm) Cuff size:   regular  Vitals Entered By: Mervin Hack CMA Duncan Dull) (November 03, 2009 3:24 PM) CC: rash on face/ pain meds   History of Present Illness: Seemed to do better on the fentanyl--at the 75 micrograms dose she got some nausea with the additional 25 patch Got full feeling in chest  Has been taking 3 oxycodone daily at this point  Pain in face is back again had tooth filled this AM---amoxil for abcess  having some tightness and "electric pains" in chest some SOB gets a chill also but no real nausea No sig activity--needs help even with driving No edema  Allergies: 1)  ! Prilosec 2)  ! Topamax 3)  ! Lyrica 4)  ! Cymbalta  Past History:  Past medical, surgical, family and social histories (including risk factors) reviewed for relevance to current acute and chronic problems.  Past Medical History: Reviewed history from 06/16/2008 and no changes required. Breast cancer, hx of GERD Hyperlipidemia Osteoarthritis Post-herpetic neuralgia Depression  CONSULTANTS Dr Fransico Michael  [phone]  Past Surgical History: Reviewed history from 09/06/2006 and no changes required. Hysterectomy 1972 Mastectomy bilateral--1980's Thyroidectomy-partial 6/03 Evette Cristal) Vaginal deliveries 1952/1954  Family History: Reviewed history from 08/28/2006 and no changes required. Family History of Aneurysm Aortic-Dad Family History of CAD Female 1st degree relative <60--Mom,sons Family History Diabetes 1st degree relative--son Family History Hypertension--son  Social History: Reviewed history from 08/28/2006 and no changes required. Widowed-1996 2 sons Retired-hosiery mill Never Smoked Alcohol use-no  Review of Systems       not eating  well weight down 7# Mouth has been dry at times    Impression & Recommendations:  Problem # 1:  CHEST PAIN (ICD-786.50) Assessment New  worrisome for coronary ischemia has had recent sig change in exercise tolerance--stopped driving, carrying groceries, etc can't walk on treadmill  will set up cardiology eval to determine appropriate work up call 911 for persistent pain  Orders: EKG w/ Interpretation (93000) Cardiology Referral (Cardiology)  Problem # 2:  POSTHERPETIC NEURALGIA (ICD-053.19) Assessment: Unchanged did well wth fentanyl but not above 75 micrograms  will have her go up to 50 for 3 days then back to her 75 micrograms   Complete Medication List: 1)  Nexium 40 Mg Cpdr (Esomeprazole magnesium) .[name].. 1 tab by mouth before breakfast and at bedtime 2)  Hydroxyzine Hcl 25 Mg Tabs (Hydroxyzine hcl) .[name].. 1-2 tablet by mouth three times a day 3)  Gabapentin 600 Mg Tabs (Gabapentin) .[name].. 1 two times a day and 2 at bedtime 4)  Lorazepam 1 Mg Tabs (Lorazepam) .[name].. 1-2 at bedtime to help sleep 5)  Acyclovir 800 Mg Tabs (Acyclovir) .[name].. 1 tab four times daily for persistent shingles 6)  Xibrom 0.09 % Soln (Bromfenac sodium) .... Instill 1 drop in right eye two times a day 7)  Oxycodone-acetaminophen 5-325 Mg Tabs (Oxycodone-acetaminophen) .[name].. 1-2 tabs by mouth three times a day as needed for severe pain 8)  Fentanyl 75 Mcg/hr Pt72 (Fentanyl) .... Apply 1 patch every 2 days 9)  Fentanyl 25 Mcg/hr Pt72 (Fentanyl) .... Apply 1 patch along with 75 micrograms patch every 3 days  Patient Instructions: 1)  Please use  2 of the 25 micrograms fentanyl patches tomorrow. 2)  Then after 3 days, restart the 75 micrograms patches every 3 days 3)  Please schedule a follow-up appointment in 2 months.  4)  Referral Appointment Information 5)  Day/Date: 6)  Time: 7)  Place/MD: 8)  Address: 9)  Phone/Fax: 10)  Patient given appointment information. Information/Orders faxed/mailed.  Current  Allergies (reviewed today): ! PRILOSEC ! TOPAMAX ! LYRICA ! CYMBALTA   EKG  Procedure date:  11/03/2009  Findings:      sinus @72  Possible lateral ischemia

## 2010-08-08 NOTE — Assessment & Plan Note (Signed)
Summary: NP6/AMD   Visit Type:  Initial Consult Referring Provider:  Dr Tillman Abide Primary Provider:  Dr Tillman Abide  CC:  Patient has SOB, chest describes her chest area as having a electricifying sensation, and and no edema in her extremities. Patient having weakness in both her legs and have to sit down. Patient is have no sense of taste and then it is hard for her swallow. .  History of Present Illness: 77 yo with history of hyperlipidemia and family history of CAD presents for evaluation of chest pain and shortness of breath.  Chest pain occurs mostly at night while in bed.  It consists of "electric shock-like" sensations that last only for seconds.  This has been going on for several weeks.  No exertional chest pain.  She is not sure if this might be related to her post-herpetic neuralgia which causes similar shock-like pains in her right-sided face and neck.  She also has developed exertional dyspnea over the last few months.  She is short of breath after walking up a flight of steps or walking about 50 feet.  No orthopnea or PND.  She is less active now because of shortness of breath.  She is not doing any yardwork.    ECG: NSR, normal  Current Medications (verified): 1)  Nexium 40 Mg Cpdr (Esomeprazole Magnesium) .[name].. 1 Tab By Mouth Before Breakfast and At Bedtime 2)  Hydroxyzine Hcl 25 Mg Tabs (Hydroxyzine Hcl) .[name].. 1-2 Tablet By Mouth Three Times A Day 3)  Gabapentin 600 Mg Tabs (Gabapentin) .[name].. 1 Two Times A Day and 2 At Bedtime 4)  Lorazepam 1 Mg Tabs (Lorazepam) .[name].. 1-2 At Bedtime To Help Sleep 5)  Acyclovir 800 Mg  Tabs (Acyclovir) .[name].. 1 Tab Four Times Daily For Persistent Shingles 6)  Xibrom 0.09 % Soln (Bromfenac Sodium) .... Instill 1 Drop in Right Eye Two Times A Day 7)  Oxycodone-Acetaminophen 5-325 Mg Tabs (Oxycodone-Acetaminophen) .[name].. 1-2 Tabs By Mouth Three Times A Day As Needed For Severe Pain 8)  Fentanyl 75 Mcg/hr Pt72 (Fentanyl) .... Apply 1 Patch Every 2  Days 9)  Fentanyl 25 Mcg/hr Pt72 (Fentanyl) .... Apply 1 Patch Along With 75 Micrograms Patch Every 3 Days  Allergies (verified): 1)  ! Prilosec 2)  ! Topamax 3)  ! Lyrica 4)  ! Cymbalta  Past History:  Past Medical History: 1. Breast cancer: Bilateral mastectomies 1986. 2. GERD 3. Hyperlipidemia 4. Osteoarthritis 5. Zoster with Post-herpetic neuralgia 6. Depression 7. History of partial thyroidectomy 8. Carotid dopplers (3/10) without significant disease.   CON[facility]ULTANT[facility] Dr Fransico Michael  [phone]  Family History: Reviewed history from 08/28/2006 and no changes required. Family History of Aneurysm Aortic-Dad Family History of CAD Female 1st degree relative <60--Mom,sons Family History Diabetes 1st degree relative--son Family History Hypertension--son  Social History: Widowed-1996, lives in [place].  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   Vital Signs:  Patient profile:   77 year old female Height:      65.25 inches Weight:      111.75 pounds BMI:     18.52 Pulse rate:   77 / minute BP sitting:   124 / 74  (left arm) Cuff size:   large  Physical Exam  General:  Well developed, well nourished, in no acute distress. Head:  normocephalic and atraumatic Nose:  no deformity, discharge, inflammation, or lesions Mouth:  Teeth,  gums and palate normal. Oral mucosa normal. Neck:  Neck supple, no JVD. No masses, thyromegaly or abnormal cervical nodes. Lungs:  Clear bilaterally to auscultation and percussion. Heart:  Non-displaced PMI, chest non-tender; regular rate and rhythm, S1, S2 without murmurs, rubs. +S4. Carotid upstroke normal, no bruit.  Pedals normal pulses. No edema, no varicosities. Abdomen:  Bowel sounds positive; abdomen soft and non-tender without masses, organomegaly, or hernias noted. No hepatosplenomegaly. Msk:  Back normal, normal gait. Muscle strength and  tone normal. Extremities:  No clubbing or cyanosis. Neurologic:  Alert and oriented x 3. Skin:  Intact without lesions or rashes. Psych:  Normal affect.   Impression & Recommendations:  Problem # 1:  CHEST PAIN (ICD-786.50) Very atypical but has exertional shortness of breath and many people in her family have had CAD.  Will have her start ASA 81 mg daily and get a [employer]   Problem # 2:  SHORTNESS OF BREATH (ICD-786.05) Exertional dyspnea that has been gradual in onset over the last few months.  ? deconditioning over the winter since she was less active.  However, will get an echocardiogram to assess LV function.   Other Orders: Echocardiogram (Echo) Nuclear Stress Test (Nuc Stress Test)  Patient Instructions: 1)  Your physician recommends that you schedule a follow-up appointment in: 2-3 Weeks following echo/ stress test 2)  Your physician has requested that you have an echocardiogram.  Echocardiography is a painless test that uses sound waves to create images of your heart. It provides your doctor with information about the size and shape of your heart and how well your heart's chambers and valves are working.  This procedure takes approximately one hour. There are no restrictions for this procedure. 3)  Your physician has requested that you have an exercise stress myoview.  For further information please visit [email or web address]  Please follow instruction sheet, as given.

## 2010-08-08 NOTE — Progress Notes (Signed)
Summary: PHI  PHI   Imported By: Harlon Flor 11/10/2009 10:35:44  _____________________________________________________________________  External Attachment:    Type:   Image     Comment:   External Document

## 2010-08-08 NOTE — Progress Notes (Signed)
Summary: refill request for hydroxyzine  Phone Note Refill Request Message from:  Fax from Pharmacy  Refills Requested: Medication #1:  HYDROXYZINE HCL 25 MG TABS 1-2 tablet by mouth three times a day   Last Refilled: 04/20/2010 Faxed request from [facility] pharmacy is on your desk.  Initial call taken by: Lowella Petties CMA, AAMA,  June 06, 2010 12:23 PM    Prescriptions: HYDROXYZINE HCL 25 MG TABS (HYDROXYZINE HCL) 1-2 tablet by mouth three times a day  #90 Tablet x 3   Entered by:   Mervin Hack CMA (AAMA)   Authorized by:   Cindee Salt MD   Signed by:   Mervin Hack CMA (AAMA) on 06/06/2010   Method used:   Electronically to        [facility]* (retail)       [address]       [place], [place]  [address]       Ph: [phone]       Fax: [phone]   RxID:   [id number]

## 2010-08-08 NOTE — Progress Notes (Signed)
Summary: Xray for (R) ankle injury per [place]'s office  Phone Note Outgoing Call   Call placed by: Milana Na, EMT-P,  Nov 22, 2009 4:19 PM Summary of Call: [name] fell during the night, twisting her (R) ankle and hitting her head. Today, she comes to the nuclear department for a [facility] Study. Upon examination to the patient's right ankle, it has some swelling and discoloration. Nero, motor, and sensory present and intact with palpation. A call to the patient's doctor, Dr. Karle Starch nurse at [facility] was contacted for further instruction. They advised that she go across the street to Urgent Care and get an X-ray of the (R) ankle. They will follow up with the patient on Wednesday May 18th. When speaking to the patient's family, they advised that she falls quite frequently. [name] also lives by herself.

## 2010-08-08 NOTE — Assessment & Plan Note (Signed)
Summary: EPH/JML   Referring Provider:  Dr Tillman Abide Primary Provider:  Dr Tillman Abide  CC:  [name] reports HA for last two days.  Pt is also dealing with shingles.  Pt had allergice reaction to the dye used for her nuclear study.  History of Present Illness: 77 yo with history of hyperlipidemia and family history of CAD returns for evaluation of chest pain and shortness of breath.  Chest pain occurs mostly at night while in bed.  It consists of "electric shock-like" sensations that last only for seconds.  This has been going on for several weeks.  No exertional chest pain.  She is not sure if this might be related to her post-herpetic neuralgia which causes similar shock-like pains in her right-sided face and neck.  She also has developed exertional dyspnea over the last few months.  She is short of breath after walking up a flight of steps or walking about 50 feet.  No orthopnea or PND.  She is less active now because of shortness of breath.  She is not doing any yardwork.    I had her do a [employer] which showed no evidence for ischemia or infarction.  Echo showed preserved LV systolic function (difficult study due to breast implants).  Patient had prolonged severe symptoms with Lexiscan and should not receive this or adenosine in the future. She still has exertional shortness of breath but has started to walk a bit for exercise and is feeling better.  She continues to get tingling in her chest.   Current Medications (verified): 1)  Nexium 40 Mg Cpdr (Esomeprazole Magnesium) .[name].. 1 Tab By Mouth Before Breakfast and At Bedtime 2)  Hydroxyzine Hcl 25 Mg Tabs (Hydroxyzine Hcl) .[name].. 1-2 Tablet By Mouth Three Times A Day 3)  Gabapentin 600 Mg Tabs (Gabapentin) .[name].. 1 Two Times A Day and 2 At Bedtime 4)  Lorazepam 1 Mg Tabs (Lorazepam) .[name].. 1-2 At Bedtime To Help Sleep 5)  Acyclovir 800 Mg  Tabs (Acyclovir) .[name].. 1 Tab Four Times Daily For Persistent Shingles 6)  Xibrom 0.09 % Soln  (Bromfenac Sodium) .... Instill 1 Drop in Right Eye Two Times A Day 7)  Oxycodone-Acetaminophen 5-325 Mg Tabs (Oxycodone-Acetaminophen) .[name].. 1-2 Tabs By Mouth Three Times A Day As Needed For Severe Pain 8)  Fentanyl 75 Mcg/hr Pt72 (Fentanyl) .... Apply 1 Patch Every 3 Days 9)  Aspir-Low 81 Mg Tbec (Aspirin) .... One Tablet Daily  Allergies (verified): 1)  ! Prilosec 2)  ! Topamax 3)  ! Lyrica 4)  ! Cymbalta  Past History:  Past Medical History: 1. Breast cancer: Bilateral mastectomies 1986. 2. GERD 3. Hyperlipidemia 4. Osteoarthritis 5. Zoster with Post-herpetic neuralgia 6. Depression 7. History of partial thyroidectomy 8. Carotid dopplers (3/10) without significant disease.  9. Atypical chest pain: Lexiscan myoview (5/11) with EF 84%, normal wall motion, small fixed apical perfusion defect likely due to breast attenuation, no evidence for ischemia or infarction.  **Patient had an adverse reaction to Lexiscan and should not receive this or adenosine in the future** 10. Dyspnea: Echo (5/11) was a difficult study due to breast implants but showed normal LV and RV size and systolic function.    CON[facility]ULTANT[facility] Dr Fransico Michael  [phone]  Family History: Reviewed history from 08/28/2006 and no changes required. Family History of Aneurysm Aortic-Dad Family History of CAD Female 1st degree relative <60--Mom,sons Family History Diabetes 1st degree relative--son Family History Hypertension--son  Social History: Reviewed history from 11/04/2009 and no changes required. Widowed-1996,  lives in [place].  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 Neck:  Neck supple, no JVD. No masses, thyromegaly or abnormal cervical nodes. Lungs:  Clear bilaterally to auscultation and percussion. Heart:  Non-displaced PMI, chest non-tender; regular rate and rhythm, S1, S2 without murmurs, rubs. +S4. Carotid upstroke normal, no bruit.  Pedals normal pulses. No edema, no varicosities. Abdomen:  Bowel sounds positive; abdomen soft and non-tender without masses, organomegaly, or hernias noted. No hepatosplenomegaly. Extremities:  No clubbing or cyanosis. Neurologic:  Alert and oriented x 3. Psych:  Normal affect.   Impression & Recommendations:  Problem # 1:  CHEST PAIN (ICD-786.50) Atypical chest pain, likely noncardiac.  Low-risk myoview (adverse response to [employer]).  Would be reasonable to continue low-dose aspirin.    Problem # 2:  SHORTNESS OF BREATH (ICD-786.05) Exertional dyspnea that has been gradual in onset over the last few months.  ? deconditioning over the winter since she was less active.  Echocardiogram showed preserved LV systolic function (though difficult study due to breast implants).   Patient Instructions: 1)  Your physician recommends that you schedule a follow-up appointment as needed with Dr Shirlee Latch.

## 2010-08-08 NOTE — Assessment & Plan Note (Signed)
Summary: Cardiology Nuclear Study  Nuclear Med Background Indications for Stress Test: Evaluation for Ischemia    History Comments: NO DOCUMENTED CAD  Symptoms: Chest Tightness, DOE, Fatigue, Palpitations, Rapid HR, SOB  Symptoms Comments: Last episode of WJ:XBJY weekend. Patient c/o getting up last night and fell, twisted her (R) ankle and hit her head on the TV.  The ankle is swollen and bruised.   Nuclear Pre-Procedure Cardiac Risk Factors: Carotid Disease, Family History - CAD, History of Smoking, Lipids Caffeine/Decaff Intake: None NPO After: 8:00 AM Lungs: Clear.  O2 Sat 98% on RA. IV 0.9% NS with Angio Cath: 20g     IV Site: (R) AC IV Started by: Stanton Kidney EMT-P Chest Size (in) 34     Cup Size B     Height (in): 66 Weight (lb): 112 BMI: 18.14 Tech Comments: Sabrina called her primary care doctor regarding her fall last night and they want her to go to Urgent Care after leaving here for an x-ray and they will follow up with her tomorrow.  [employer], CMA-n  Nuclear Med Study 1 or 2 day study:  1 day     Stress Test Type:  Eugenie Birks Reading MD:  Marca Ancona, MD     Referring MD:  Marca Ancona, MD Resting Radionuclide:  Technetium 36m Tetrofosmin     Resting Radionuclide Dose:  11 mCi  Stress Radionuclide:  Technetium 37m Tetrofosmin     Stress Radionuclide Dose:  33 mCi   Stress Protocol   Lexiscan: 0.4 mg   Stress Test Technologist:  [employer] CMA-N     Nuclear Technologist:  Domenic Polite CNMT  Rest Procedure  Myocardial perfusion imaging was performed at rest 45 minutes following the intravenous administration of Myoview Technetium 1m Tetrofosmin.  Stress Procedure  The patient received IV Lexiscan 0.4 mg over 15-seconds.  Myoview injected at 30-seconds.  There were no significant EKG changes with lexiscan, but the patient did have a significant reaction to the lexiscan with moderate to severe abdominal and bilateral thigh cramping.  Dr. Graciela Husbands  was  present and ordered Aminophylline 100 mg IV.  The patient's symptoms were relieved within 15-minutes.  She had a hypertensive response with her leg cramps, 205/93.  Quantitative spect images were obtained after a 45 minute delay.  QPS Raw Data Images:  Prominent breast shadow.  Stress Images:  Small apical perfusion defect.  Rest Images:  Small apical perfusion defect.  Subtraction (SDS):  Small fixed apical perfusion defect.  Transient Ischemic Dilatation:  107  (Normal <1.22)  Lung/Heart Ratio:  .11  (Normal <0.45)  Quantitative Gated Spect Images QGS EDV:  49 ml QGS ESV:  8 ml QGS EF:  84 % QGS cine images:  Normal wall motion.    Overall Impression  Exercise Capacity: Lexiscan study.  BP Response: BP increased to 205/93 with adverse reaction to Lexiscan.  Clinical Symptoms: Severe reaction to Lexiscan with severe abdominal and thigh pain, required aminophylline.  ECG Impression: No significant ST segment change suggestive of ischemia. Overall Impression: Small fixed apical perfusion defect is likely attenuation from prominent breast shadow.  No evidence for ischemia or infarction.  Overall Impression Comments: Would avoid Lexiscan and adenosine in the future due to significant adverse Lexiscan reaction.   Appended Document: Cardiology Nuclear Study No significant defect, normal study.   Appended Document: Cardiology Nuclear Study Attempted to call with results.  LMOM TCB.  EWJ  Appended Document: Cardiology Nuclear Study Pt was seen in office on 11/24/09  by Dr Shirlee Latch, pt aware of results.  EWJ

## 2010-08-08 NOTE — Miscellaneous (Signed)
Summary: Controlled Substance Agreement  Controlled Substance Agreement   Imported By: Lanelle Bal 04/17/2010 09:05:52  _____________________________________________________________________  External Attachment:    Type:   Image     Comment:   External Document

## 2010-08-08 NOTE — Progress Notes (Signed)
Summary: Patient fell last night  Phone Note From Other Clinic Call back at [phone]   Caller: [facility]rina Call For: Dr. Alphonsus Sias Summary of Call: Martie Lee called because patient was in there office now and she fell last night injuring her right ankle.  Says it is very swollen and bruised, thinks it needs to be xrayed.  Dr. Alphonsus Sias has already left for the day, I asked Dr. Ermalene Searing what to do and she advised that patient needs to go to Urgent Care to have it xrayed.  Martie Lee said that she would have the patient go to the Urgent Care which is right across the street from there office.  Advised that we would check on patient tomorrow to see how she is doing.  Initial call taken by: Linde Gillis CMA Duncan Dull),  Nov 22, 2009 2:42 PM  Follow-up for Phone Call        Please check on her this AM Cindee Salt MD  Nov 23, 2009 7:51 AM   pt states that her foot is better, no swelling today, her head has been hurting all day but she feels better. DeShannon Smith CMA Duncan Dull)  Nov 23, 2009 5:20 PM   Please track down the x-ray report if she had it done Follow-up by: Cindee Salt MD,  Nov 23, 2009 8:27 PM  Additional Follow-up for Phone Call Additional follow up Details #1::        called Sabrina at nuclear med, left message to have her return my call DeShannon Katrinka Blazing CMA Duncan Dull)  Nov 24, 2009 10:12 AM   x-ray report on your desk from [facility]. DeShannon Smith CMA Duncan Dull)  Nov 24, 2009 3:22 PM   No fracture  x-ray reviewed Additional Follow-up by: Cindee Salt MD,  Nov 24, 2009 3:50 PM

## 2010-08-10 NOTE — Progress Notes (Signed)
Summary: Lorazepam  Phone Note Refill Request Message from:  Fax from Pharmacy on July 17, 2010 10:29 AM  Refills Requested: Medication #1:  LORAZEPAM 1 MG TABS 1-2 at bedtime to help sleep   Supply Requested: 1 month   Last Refilled: 06/19/2010 [facility] [phone]   Method Requested: Telephone to Pharmacy Initial call taken by: Benny Lennert CMA Duncan Dull),  July 17, 2010 10:30 AM  Follow-up for Phone Call        okay #60 x 3 Follow-up by: Cindee Salt MD,  July 17, 2010 1:55 PM  Additional Follow-up for Phone Call Additional follow up Details #1::        Rx faxed to pharmacy Additional Follow-up by: DeShannon Katrinka Blazing CMA Duncan Dull),  July 17, 2010 2:19 PM    Prescriptions: LORAZEPAM 1 MG TABS (LORAZEPAM) 1-2 at bedtime to help sleep  #60 x 3   Entered by:   Mervin Hack CMA (AAMA)   Authorized by:   Cindee Salt MD   Signed by:   Mervin Hack CMA (AAMA) on 07/17/2010   Method used:   Handwritten   RxID:   [id number]

## 2010-08-10 NOTE — Assessment & Plan Note (Signed)
Summary: ROA FOR 3 MONTH FOLLOW-UP/JRR   Vital Signs:  Patient profile:   77 year old female Weight:      118 pounds Temp:     98.4 degrees F oral Pulse rate:   64 / minute Pulse rhythm:   regular BP sitting:   120 / 80  (left arm) Cuff size:   regular  Vitals Entered By: Mervin Hack CMA Duncan Dull) (July 17, 2010 11:28 AM) CC: 3 month follow-up, dizzy and sore scalp   History of Present Illness: Ongoing pain and senstivity over right eye Morphine helps but sedates her Also using the hydroxyzine but it doesn't seem to be helping Discussed that this might add to the sedation uses the oxycodone short acting at times also, but not as regularly  Allergies: 1)  ! Prilosec 2)  ! Topamax 3)  ! Lyrica 4)  ! Cymbalta  Past History:  Past medical, surgical, family and social histories (including risk factors) reviewed for relevance to current acute and chronic problems.  Past Medical History: Reviewed history from 11/24/2009 and no changes required. 1. Breast cancer: Bilateral mastectomies 1986. 2. GERD 3. Hyperlipidemia 4. Osteoarthritis 5. Zoster with Post-herpetic neuralgia 6. Depression 7. History of partial thyroidectomy 8. Carotid dopplers (3/10) without significant disease.  9. Atypical chest pain: Lexiscan myoview (5/11) with EF 84%, normal wall motion, small fixed apical perfusion defect likely due to breast attenuation, no evidence for ischemia or infarction.  **Patient had an adverse reaction to Lexiscan and should not receive this or adenosine in the future** 10. Dyspnea: Echo (5/11) was a difficult study due to breast implants but showed normal LV and RV size and systolic function.    [facility] Dr Fransico Michael  [phone]  Past Surgical History: Reviewed history from 09/06/2006 and no changes required. Hysterectomy 1972 Mastectomy bilateral--1980's Thyroidectomy-partial 6/03 Evette Cristal) Vaginal deliveries 1952/1954  Family History: Reviewed history from  08/28/2006 and no changes required. Family History of Aneurysm Aortic-Dad Family History of CAD Female 1st degree relative <60--Mom,sons Family History Diabetes 1st degree relative--son Family History Hypertension--son  Social History: Reviewed history from 11/04/2009 and no changes required. Widowed-1996, lives in [place].  2 sons Retired-hosiery mill Never Smoked Alcohol use-no  Physical Exam  Psych:  normally interactive and good eye contact.     Impression & Recommendations:  Problem # 1:  POSTHERPETIC NEURALGIA (ICD-053.19) Assessment Unchanged no better may be limited by sedation will have her try to cut back on the hydroxyzine--doesn't seem to do much should be more regular with the morphine and gabapentin  counselled all of 15 minute visit  Problem # 2:  NEUROPATHY (ICD-355.9) Assessment: Comment Only will recheck labs it has been a year  Orders: Venipuncture (11914) TLB-Renal Function Panel (80069-RENAL) TLB-CBC Platelet - w/Differential (85025-CBCD) TLB-Hepatic/Liver Function Pnl (80076-HEPATIC) TLB-TSH (Thyroid Stimulating Hormone) (84443-TSH)  Complete Medication List: 1)  Nexium 40 Mg Cpdr (Esomeprazole magnesium) .[name].. 1 tab by mouth before breakfast and at bedtime 2)  Hydroxyzine Hcl 25 Mg Tabs (Hydroxyzine hcl) .[name].. 1-2 tablet by mouth three times a day as needed for itching 3)  Gabapentin 600 Mg Tabs (Gabapentin) .[name].. 1 two times a day and 2 at bedtime 4)  Lorazepam 1 Mg Tabs (Lorazepam) .[name].. 1-2 at bedtime to help sleep 5)  Acyclovir 800 Mg Tabs (Acyclovir) .[name].. 1 tab four times daily for persistent shingles 6)  Morphine Sulfate Cr 60 Mg Xr12h-tab (Morphine sulfate) .[name].. 1 tab by mouth two times a day for chronic pain 7)  Oxycodone-acetaminophen 5-325  Mg Tabs (Oxycodone-acetaminophen) .[name].. 1-2 tabs by mouth three times a day as needed for severe pain 8)  Xibrom 0.09 % Soln (Bromfenac sodium) .... Instill 1 drop in right eye two times a day 9)   Aspir-low 81 Mg Tbec (Aspirin) .... One tablet daily  Patient Instructions: 1)  Please take the gabapentin and morphine regularly every day 2)  Please don't take the hydroxyzine unless you are just having itching but not much pain 3)  Bring in your pill bottles next time so we can review them all 4)  Please schedule a follow-up appointment in 1 month.    Orders Added: 1)  Est. Patient Level III [16109] 2)  Venipuncture [36415] 3)  TLB-Renal Function Panel [80069-RENAL] 4)  TLB-CBC Platelet - w/Differential [85025-CBCD] 5)  TLB-Hepatic/Liver Function Pnl [80076-HEPATIC] 6)  TLB-TSH (Thyroid Stimulating Hormone) [60454-UJW]    Current Allergies (reviewed today): ! PRILOSEC ! TOPAMAX ! LYRICA ! CYMBALTA

## 2010-08-10 NOTE — Progress Notes (Signed)
Summary: refill request for morphine  Phone Note Refill Request Call back at Home Phone [phone] Message from:  Patient  Refills Requested: Medication #1:  MORPHINE SULFATE CR 60 MG XR12H-TAB 1 tab by mouth two times a day for chronic pain Please call pt when ready.  Initial call taken by: Lowella Petties CMA, AAMA,  July 04, 2010 12:04 PM  Follow-up for Phone Call        Rx written Follow-up by: Cindee Salt MD,  July 04, 2010 2:04 PM  Additional Follow-up for Phone Call Additional follow up Details #1::        Patient Advised.   Prescription left at front desk.  Additional Follow-up by: Delilah Shan CMA (AAMA),  July 04, 2010 4:06 PM    New/Updated Medications: MORPHINE SULFATE CR 60 MG XR12H-TAB (MORPHINE SULFATE) 1 tab by mouth two times a day for chronic pain Prescriptions: MORPHINE SULFATE CR 60 MG XR12H-TAB (MORPHINE SULFATE) 1 tab by mouth two times a day for chronic pain  #60 x 0   Entered and Authorized by:   Cindee Salt MD   Signed by:   Cindee Salt MD on 07/04/2010   Method used:   Print then Give to Patient   RxID:   [id number]

## 2010-08-11 ENCOUNTER — Telehealth: Payer: Self-pay | Admitting: Internal Medicine

## 2010-08-21 ENCOUNTER — Encounter: Payer: Self-pay | Admitting: Internal Medicine

## 2010-08-21 ENCOUNTER — Ambulatory Visit (INDEPENDENT_AMBULATORY_CARE_PROVIDER_SITE_OTHER): Payer: MEDICARE | Admitting: Internal Medicine

## 2010-08-21 DIAGNOSIS — B0229 Other postherpetic nervous system involvement: Secondary | ICD-10-CM

## 2010-08-24 NOTE — Progress Notes (Signed)
Summary: refill request for morphine  Phone Note Refill Request Call back at Home Phone [phone] Message from:  Patient  Refills Requested: Medication #1:  MORPHINE SULFATE CR 60 MG XR12H-TAB 1 tab by mouth two times a day for chronic pain Phoned request from pt, she will be ok to wait till monday.  Initial call taken by: Lowella Petties CMA, AAMA,  August 11, 2010 2:53 PM  Follow-up for Phone Call        Rx written Follow-up by: Cindee Salt MD,  August 14, 2010 1:44 PM  Additional Follow-up for Phone Call Additional follow up Details #1::        left message on machine that rx ready for pick-up  Additional Follow-up by: DeShannon Smith CMA Duncan Dull),  August 14, 2010 3:32 PM    New/Updated Medications: MORPHINE SULFATE CR 60 MG XR12H-TAB (MORPHINE SULFATE) 1 tab by mouth two times a day for chronic pain Prescriptions: MORPHINE SULFATE CR 60 MG XR12H-TAB (MORPHINE SULFATE) 1 tab by mouth two times a day for chronic pain  #60 x 0   Entered and Authorized by:   Cindee Salt MD   Signed by:   Cindee Salt MD on 08/14/2010   Method used:   Print then Give to Patient   RxID:   [id number]

## 2010-08-30 NOTE — Assessment & Plan Note (Signed)
Summary: 1 mo f/u alc   Vital Signs:  Patient profile:   77 year old female Weight:      119 pounds Temp:     98.2 degrees F oral Pulse rate:   77 / minute Pulse rhythm:   regular BP sitting:   156 / 69  (left arm) Cuff size:   regular  Vitals Entered By: Mervin Hack CMA Duncan Dull) (August 21, 2010 11:13 AM) CC: follow-up   History of Present Illness: Did try off the hydroxyzine When the pain gets bad, she has been taking the morphine hasn't been clear about the need to take two times a day every day She thinks she is taking it two times a day most days though Does use as needed oxycodone  ~2 per day---does use this as needed   Not as sleepy off the hydroxyzine does notice more itching  Has ongoing pain but feels "like I can handle it like this"  still with active sores on head Pain in right ear now also  Allergies: 1)  ! Prilosec 2)  ! Topamax 3)  ! Lyrica 4)  ! Cymbalta  Past History:  Past medical, surgical, family and social histories (including risk factors) reviewed for relevance to current acute and chronic problems.  Past Medical History: Reviewed history from 11/24/2009 and no changes required. 1. Breast cancer: Bilateral mastectomies 1986. 2. GERD 3. Hyperlipidemia 4. Osteoarthritis 5. Zoster with Post-herpetic neuralgia 6. Depression 7. History of partial thyroidectomy 8. Carotid dopplers (3/10) without significant disease.  9. Atypical chest pain: Lexiscan myoview (5/11) with EF 84%, normal wall motion, small fixed apical perfusion defect likely due to breast attenuation, no evidence for ischemia or infarction.  **Patient had an adverse reaction to Lexiscan and should not receive this or adenosine in the future** 10. Dyspnea: Echo (5/11) was a difficult study due to breast implants but showed normal LV and RV size and systolic function.    [facility] Dr Fransico Michael  [phone]  Past Surgical History: Reviewed history from 09/06/2006 and no  changes required. Hysterectomy 1972 Mastectomy bilateral--1980's Thyroidectomy-partial 6/03 Evette Cristal) Vaginal deliveries 1952/1954  Family History: Reviewed history from 08/28/2006 and no changes required. Family History of Aneurysm Aortic-Dad Family History of CAD Female 1st degree relative <60--Mom,sons Family History Diabetes 1st degree relative--son Family History Hypertension--son  Social History: Reviewed history from 11/04/2009 and no changes required. Widowed-1996, lives in [place].  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     Impression & Recommendations:  Problem # 1:  POSTHERPETIC NEURALGIA (ICD-053.19) Assessment Unchanged ongoing pain Less sedation Still doesn't seem to have the morphine dosing right---explained it is two times a day every day. Told her to plan 9/9  COunselled 10 of the 15 minute visit  Complete Medication List: 1)  Nexium 40 Mg Cpdr (Esomeprazole magnesium) .[name].. 1 tab by mouth before breakfast and at bedtime 2)  Hydroxyzine Hcl 25 Mg Tabs (Hydroxyzine hcl) .[name].. 1-2 tablet by mouth at bedtime to help itching 3)  Gabapentin 600 Mg Tabs (Gabapentin) .[name].. 1 two times a day and 2 at bedtime 4)  Lorazepam 1 Mg Tabs (Lorazepam) .[name].. 1-2  tabsat bedtime as needed to help sleep 5)  Acyclovir 800 Mg Tabs (Acyclovir) .[name].. 1 tab four times daily for persistent shingles 6)  Morphine Sulfate Cr 60 Mg Xr12h-tab (Morphine sulfate) .[name].. 1 tab by mouth two times a day about 9am and 9pm for chronic pain 7)  Oxycodone-acetaminophen 5-325 Mg Tabs (Oxycodone-acetaminophen) .[name].. 1-2 tabs by mouth three times a day as needed for severe pain 8)  Xibrom 0.09 % Soln (Bromfenac sodium) .... Instill 1 drop in right eye two times a day 9)  Aspir-low 81 Mg Tbec (Aspirin) .... One tablet daily  Patient  Instructions: 1)  Please schedule a follow-up appointment in 3 months .    Orders Added: 1)  Est. Patient Level III [11914]    Current Allergies (reviewed today): ! PRILOSEC ! TOPAMAX ! LYRICA ! CYMBALTA

## 2010-09-13 ENCOUNTER — Telehealth: Payer: Self-pay | Admitting: Family Medicine

## 2010-09-19 NOTE — Progress Notes (Signed)
Summary: oxycodone / morphine   Phone Note Refill Request Call back at Home Phone [phone] Message from:  Patient on September 13, 2010 3:38 PM  Refills Requested: Medication #1:  MORPHINE SULFATE CR 60 MG XR12H-TAB 1 tab by mouth two times a day about 9AM and 9PM for chronic pain  Medication #2:  OXYCODONE-ACETAMINOPHEN 5-325 MG TABS 1-2 tabs by mouth three times a day as needed for severe pain Please call patient when rx is ready  Initial call taken by: Melody Comas,  September 13, 2010 3:39 PM Caller: Patient Call For: Cindee Salt MD  Follow-up for Phone Call        I do not see a prev refil date on the morphine -- am I missing it ?  Follow-up by: Judith Part MD,  September 13, 2010 4:44 PM  Additional Follow-up for Phone Call Additional follow up Details #1::        On 08/11/10 phone note Rx written for Morphine Sulfate CR 60mg  XR 12H tab with instructions 1 tab by mouth two times a day for chronic pain #60 x0 refills by Dr Alphonsus Sias. I am not sure why does not show up on medicine list refills?Lewanda Rife LPN  September 12, 1476 5:27 PM     Additional Follow-up for Phone Call Additional follow up Details #2::    thanks  printed in put in nurse in box for pickup  Follow-up by: Judith Part MD,  September 13, 2010 5:35 PM  Additional Follow-up for Phone Call Additional follow up Details #3:: Details for Additional Follow-up Action Taken: Patient notified as instructed by telephone. Prescription left at front desk. Lewanda Rife LPN  September 14, 2954 8:11 AM   Prescriptions: MORPHINE SULFATE CR 60 MG XR12H-TAB (MORPHINE SULFATE) 1 tab by mouth two times a day about 9AM and 9PM for chronic pain  #60 x 0   Entered and Authorized by:   Judith Part MD   Signed by:   Judith Part MD on 09/13/2010   Method used:   Print then Give to Patient   RxID:   [id number] OXYCODONE-ACETAMINOPHEN 5-325 MG TABS (OXYCODONE-ACETAMINOPHEN) 1-2 tabs by mouth three times a day as needed for  severe pain  #120 x 0   Entered and Authorized by:   Judith Part MD   Signed by:   Judith Part MD on 09/13/2010   Method used:   Print then Give to Patient   RxID:   [id number]

## 2010-10-19 ENCOUNTER — Other Ambulatory Visit: Payer: Self-pay | Admitting: *Deleted

## 2010-10-19 MED ORDER — MORPHINE SULFATE ER 60 MG PO CP24: ORAL_CAPSULE | ORAL | Status: DC

## 2010-10-19 NOTE — Telephone Encounter (Signed)
Spoke with patient and advised rx ready for pick-up 

## 2010-10-20 ENCOUNTER — Telehealth: Payer: Self-pay | Admitting: *Deleted

## 2010-10-20 MED ORDER — MORPHINE SULFATE 60 MG PO TB12: ORAL_TABLET | ORAL | Status: DC

## 2010-10-20 NOTE — Telephone Encounter (Signed)
signed

## 2010-10-20 NOTE — Telephone Encounter (Signed)
Medication was entered wrong. It was supposed to be for 12 hr instead of 24 hr. I'm not sure how to change medication in chart.

## 2010-10-20 NOTE — Telephone Encounter (Signed)
Received call from pharmacy, stating that patient is confused and doesn't remember stating that she would pick up on Monday. Pharmacist says that patient will be out of meds tomorrow and can't go the weekend with out. Pharmacist asked for verbal order and to just destroy rx. Corrected rx destroyed verbal order given.

## 2010-10-20 NOTE — Telephone Encounter (Signed)
Patient will come by on Monday to pick up, rx on your desk to sign,

## 2010-10-21 NOTE — Telephone Encounter (Signed)
okay

## 2010-11-09 ENCOUNTER — Other Ambulatory Visit: Payer: Self-pay | Admitting: *Deleted

## 2010-11-09 MED ORDER — LORAZEPAM 1 MG PO TABS: ORAL_TABLET | ORAL | Status: DC

## 2010-11-09 NOTE — Telephone Encounter (Signed)
rx faxed to pharmacy manually  

## 2010-11-09 NOTE — Telephone Encounter (Signed)
Fax is on your desk . 

## 2010-11-09 NOTE — Telephone Encounter (Signed)
Okay #60 x 3 

## 2010-11-10 ENCOUNTER — Other Ambulatory Visit: Payer: Self-pay | Admitting: *Deleted

## 2010-11-10 MED ORDER — ESOMEPRAZOLE MAGNESIUM 40 MG PO PACK: PACK | ORAL | Status: DC

## 2010-11-20 ENCOUNTER — Encounter: Payer: Self-pay | Admitting: Internal Medicine

## 2010-11-21 ENCOUNTER — Ambulatory Visit (INDEPENDENT_AMBULATORY_CARE_PROVIDER_SITE_OTHER): Payer: Medicare Other | Admitting: Internal Medicine

## 2010-11-21 ENCOUNTER — Encounter: Payer: Self-pay | Admitting: Internal Medicine

## 2010-11-21 VITALS — BP 134/65 | HR 60 | Temp 98.6°F | Ht 66.0 in | Wt 117.0 lb

## 2010-11-21 DIAGNOSIS — R350 Frequency of micturition: Secondary | ICD-10-CM

## 2010-11-21 DIAGNOSIS — G479 Sleep disorder, unspecified: Secondary | ICD-10-CM

## 2010-11-21 DIAGNOSIS — F329 Major depressive disorder, single episode, unspecified: Secondary | ICD-10-CM

## 2010-11-21 DIAGNOSIS — G589 Mononeuropathy, unspecified: Secondary | ICD-10-CM

## 2010-11-21 DIAGNOSIS — F3289 Other specified depressive episodes: Secondary | ICD-10-CM

## 2010-11-21 DIAGNOSIS — K219 Gastro-esophageal reflux disease without esophagitis: Secondary | ICD-10-CM

## 2010-11-21 DIAGNOSIS — N390 Urinary tract infection, site not specified: Secondary | ICD-10-CM

## 2010-11-21 DIAGNOSIS — B0229 Other postherpetic nervous system involvement: Secondary | ICD-10-CM

## 2010-11-21 LAB — POCT URINALYSIS DIPSTICK
Bilirubin, UA: NEGATIVE
Glucose, UA: NEGATIVE
Ketones, UA: POSITIVE
Nitrite, UA: POSITIVE
pH, UA: 8

## 2010-11-21 MED ORDER — CIPROFLOXACIN HCL 250 MG PO TABS: 250.0000 mg | ORAL_TABLET | Freq: Two times a day (BID) | ORAL | Status: AC

## 2010-11-21 MED ORDER — MORPHINE SULFATE 60 MG PO TB12: ORAL_TABLET | ORAL | Status: DC

## 2010-11-21 MED ORDER — OXYCODONE-ACETAMINOPHEN 5-325 MG PO TABS: 1.0000 | ORAL_TABLET | Freq: Three times a day (TID) | ORAL | Status: DC | PRN

## 2010-11-21 NOTE — Progress Notes (Signed)
Subjective:    Patient ID: [name], female    DOB: [birth year], 77 y.o.   MRN: [id number]  HPI Having urinary symptoms Goes back a couple of weeks Urgency and dysuria Pain in suprapubic area as well Bad odor No visible hematuria No fever  Chronic pain and itching persists Broke out on nose and some nodules on head again Taking the morphine bid and oxycodone about tid Some relief but persists  Depression has been quiet [facility]---e[facility] there daily Sleeps okay with all the meds  Current outpatient prescriptions:acyclovir (ZOVIRAX) 800 MG tablet, Take 800 mg by mouth 4 (four) times daily.  , Disp: , Rfl: ;  aspirin 81 MG tablet, Take 81 mg by mouth daily.  , Disp: , Rfl: ;  bromfenac (XIBROM) 0.09 % ophthalmic solution, Place 1 drop into the right eye 2 (two) times daily.  , Disp: , Rfl:  esomeprazole (NEXIUM) 40 MG packet, Take 1 capsule every morning before breakfast and 1 capsule every night at bedtime., Disp: 60 each, Rfl: 11;  gabapentin (NEURONTIN) 600 MG tablet, Take 600 mg by mouth 2 (two) times daily. And 2 tablets by mouth at bedtime , Disp: , Rfl: ;  hydrOXYzine (ATARAX) 25 MG tablet, Take 25-50 mg by mouth at bedtime as needed.  , Disp: , Rfl:  LORazepam (ATIVAN) 1 MG tablet, Take 1-2 tablets by mouth at bedtime to help sleep., Disp: 60 tablet, Rfl: 3;  morphine (MS CONTIN) 60 MG 12 hr tablet, 1 tab by mouth two times a day about 9AM and 9PM for chronic pain, Disp: 60 tablet, Rfl: 0;  oxyCODONE-acetaminophen (PERCOCET) 5-325 MG per tablet, Take 1-2 tablets by mouth 3 (three) times daily as needed.  , Disp: , Rfl:   Past Medical History  Diagnosis Date  . Breast cancer     Bilateral mastectomies 1986.  [name] GERD (gastroesophageal reflux disease)   . Hyperlipidemia   . Arthritis   . Depression   . Zoster      with Post-herpetic neuralgia  . History of partial thyroidectomy   . Dyspnea     Echo (5/11) was a difficult study due to breast  implants but showed normal LV and RV size and systolic function.      [name] Atypical chest pain     Lexiscan myoview (5/11) with EF 84%, normal wall motion, small fixed apical  perfusion defect likely due to breast attenuation, no evidence for ischemia or infarction.  **Patient had an    Past Surgical History  Procedure Date  . Abdominal hysterectomy   . Mastectomy 1980    bilateral  . Vaginal delivery     No family history on file.  History   Social History  . Marital Status: Widowed    Spouse Name: N/A    Number of Children: 2  . Years of Education: N/A   Occupational History  . retired Public librarian    Social History Main Topics  . Smoking status: Never Smoker   . Smokeless tobacco: Not on file  . Alcohol Use: No  . Drug Use: No  . Sexually Active: Not on file   Other Topics Concern  . Not on file   Social History Narrative  . No narrative on file   Review of Systems Some low back pain still Appetite is never that good---does okay Weight is stable    Objective:   Physical Exam  Constitutional: She appears well-developed and well-nourished. No distress.  Neck: Normal range of motion. Neck supple.  Cardiovascular: Normal rate, regular rhythm, normal heart sounds and intact distal pulses.  Exam reveals no gallop.   No murmur heard. Pulmonary/Chest: Effort normal and breath sounds normal. No respiratory distress. She has no wheezes. She has no rales.  Abdominal: Soft. There is tenderness.       Mild suprapubic tenderness  Musculoskeletal: Normal range of motion. She exhibits no edema and no tenderness.  Lymphadenopathy:    She has no cervical adenopathy.  Skin:       Scattered lesions in right scalp as usual  Psychiatric: Her behavior is normal. Judgment and thought content normal.       Dysthymic (due to chronic pain)          Assessment & Plan:

## 2010-11-21 NOTE — H&P (Signed)
NAMEHIROMI, [name]                ACCOUNT NO.:  [id number]   MEDICAL RECORD NO.:  [id number]          PATIENT TYPE:  EMS   LOCATION:  [facility]                         FACILITY:  [facility]   PHYSICIAN:  Thora Lance, M.D.  DATE OF BIRTH:  1927/09/24   DATE OF ADMISSION:  02/14/2007  DATE OF DISCHARGE:                              HISTORY & PHYSICAL   CHIEF COMPLAINT:  Found on the floor.   HISTORY OF PRESENT ILLNESS:  This is a 77 year old white female with a  history of chronic pain related to post herpetic neuralgia and  gastroesophageal reflux.  Today, her son called the house and there was  no answer.  Her son went out to the house at 3 p.m. and found that his  mother was on the floor.  She was able to crawl to the door.  The  patient reported that she has had a cough for several days.  She has had  some chest discomfort and shortness of breath.  She was taken to the ER,  where she was found to have a fever, elevated white count, and some  altered mental status.  A chest x-ray shows a right middle lobe  pneumonia.   PAST MEDICAL HISTORY:  1. Herpes zoster about 2-3 years ago, post herpetic neuralgia, [facility]      [facility].  2. GERD.  3. Breast cancer in the 1980s.   SURGICAL:  1. Fracture, left arm.  2. Right breast mastectomy and left breast lumpectomy in the 1980s.   ALLERGIES:  No known drug allergies.   CURRENT MEDICATIONS:  1. OxyContin 20 mg p.o. t.i.d.  2. Lyrica 25 mg one or two a day.  3. Nexium 40 mg daily.  4. Lorazepam 1 mg one or two q.h.s.  5. Gabapentin 600 mg b.i.d. and 1200 mg q.h.s.  6. Acyclovir 800 mg, one or two a day.  7. Lidoderm 5% patch to the head 12 hours a day p.r.n. just prescribed      by Dr. Metta Clines.   FAMILY HISTORY:  Noncontributory.   SOCIAL HISTORY:  She lives alone, widowed as of 1996, two sons - one  accompanies her tonight.  Tobacco:  No.  Alcohol:  No.   REVIEW OF SYSTEMS:  She has had a  sore throat of late.  She reports that  she had a nerve block injection in her right neck about 2 weeks ago.   PHYSICAL EXAMINATION:  GENERAL:  Easily arousable, mildly drowsy, white  female.  VITAL SIGNS:  Initially in the emergency room, blood pressure 116/54,  heart rate 86, respirations 18, temperature 101.1, oxygen saturation  reported at 100%, not clear if this was on oxygen or not.  HEENT:  Pupils equally responding to light.  Anicteric.  Ears:  TMs  clear.  Oropharynx shows a white exudate in her posterior oral cavity on  the palate.  Mild erythema.  NECK:  No adenopathy.  No thyromegaly or bruits.  LUNGS:  Show a few crackles in the right mid lung field.  HEART:  Regular rate and rhythm without murmur, gallop or rub.  ABDOMEN:  Soft, nontender, no mass or hepatosplenomegaly.  EXTREMITIES:  Show no edema.  NEUROLOGIC:  Nonfocal.   LABORATORY:  CBC:  WBC 19.2, hemoglobin 13.5, platelet count 192, with a  left shift.  Chemistry:  Sodium 139, potassium 4.1, chloride 104,  bicarbonate 25, glucose 113, BUN 16, creatinine 1.1, calcium 8.6.  Total  protein 6.5, albumin 3.2, SGOT 50, SGPT 36, calcium 8.6.  Urinalysis  with 3-6 RBCs, 0-2 WBCs.  Chest x-ray shows a patchy right middle lobe  infiltrate.  CT scan of the brain shows no acute disease.  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   ASSESSMENT:  1. Right middle lobe pneumonia.  2. Oral Candidiasis.  3. Chronic pain/post herpetic neuralgia.  4. Gastroesophageal reflux disease.   PLAN:  1. Admit to medical bed.  2. IV Rocephin and azithromycin.  3. Oxygen.  4. IV fluids.  5. Continue outpatient medications.  6. Nystatin swish and swallow for oral Candidiasis.           ______________________________  Thora Lance, M.D.     Delorse Limber  D:  02/14/2007  T:  02/14/2007  Job:  [id number]   cc:   Karie Schwalbe, MD

## 2010-11-21 NOTE — Discharge Summary (Signed)
NAMESHEZA, [name]                ACCOUNT NO.:  [id number]   MEDICAL RECORD NO.:  [id number]          PATIENT TYPE:  INP   LOCATION:  6715                         FACILITY:  [facility]   PHYSICIAN:  Corwin Levins, MD      DATE OF BIRTH:  09-21-1927   DATE OF ADMISSION:  02/14/2007  DATE OF DISCHARGE:  02/19/2007                               DISCHARGE SUMMARY   DISCHARGE DIAGNOSES:  1. Right-sided pneumonia.  2. History of herpes zoster with post-herpetic neuralgia.  3. Gastroesophageal reflux disease.  4. History of breast cancer.  5. Oral candidiasis.   HISTORY OF PRESENT ILLNESS:  [name] is a 77 year old white female  who was admitted on February 14, 2007, after being found on the floor.  She  has a history of chronic pain related to her post-herpetic neuralgia and  also a history of reflux.  On the day of admission, her son called the  home and there was no answer.  He went to the house at 3:00 p.m. and  found his mother on the floor.  She was able to crawl to the door.  The  patient reported that she had had a cough for several days as well as  some chest discomfort and shortness of breath.  She was brought to the  emergency room where she was noted to have a leukocytosis and fever as  well as some altered mental status.  Chest x-ray performed at that time  revealed a right middle lobe pneumonia.  She was admitted for further  evaluation and treatment.   PAST MEDICAL HISTORY:  1. Herpes zoster 2-3 years ago with post-herpetic neuralgia managed [facility]      [facility] with multiple agents.  2. Gastroesophageal reflux disease.  3. History of breast cancer in the 1980's.   COURSE OF HOSPITALIZATION:  1. Right-sided pneumonia.  The patient was admitted and was placed on      IV Rocephin and IV Zithromax.  She continued to improve clinically      and was transitioned over to oral antibiotics.  She is currently      afebrile.  We will check an O2 sat with ambulation prior  to      discharge.  If this is stable,  plan to discharge the patient to      home this afternoon.  We will also ask Home Health Physical Therapy      to perform a home health safety eval.  2. History of herpes zoster with post-herpetic neuralgia.  The patient      was placed on an increased dose of Lyrica during this admission;      however, complained that this made her feel loopy.  She requests      to be discharged to home on her medications as prior to admission.      She will need followup with [facility].  3. Oral candidiasis.  The patient was treated with Mycelex Troches as      well as nystatin during this admission.  She will  be discharged to      home with additional nystatin swish and swallow.   DISCHARGE MEDICATIONS:  1. Nystatin 5 mL p.o. swish and swallow four times daily x10 days.  2. OxyContin 20 mg p.o. t.i.d.  3. Nexium 40 mg p.o. daily.  4. Lyrica 25 mg one to two times daily.  5. K-Dur 20 mEq p.o. daily.  6. Tessalon 100 mg p.o. t.i.d. as needed for cough.  7. Ceftin 250 mg p.o. b.i.d. through February 24, 2007 and then stop.  8. Gabapentin 600 mg in the morning and mid-day and 1200 mg at bed      time.  9. Acyclovir 800 mg tabs one tablet p.o. five times daily.  10.Lorazepam 1 mg one to two tablets p.o. at bedtime if needed.  11.Hydroxyzine 25 mg one to two tablets p.o. t.i.d.  12.Lidoderm patch 5% to be applied as prior to admission per Dr.      Letta Moynahan instructions.   PERTINENT DISCHARGE LABORATORY DATA:  Hemoglobin 11, hematocrit 32.3,  BUN 4, creatinine 0.61.   DISPOSITION:  Plan to discharge the patient to home.   DISCHARGE FOLLOWUP:  The patient is instructed to follow up with Dr.  Tillman Abide on Friday August 22 at 1:15 a.m.  She is instructed to  call Dr. Alphonsus Sias should she develop fever over 101 or increased weakness.      Sandford Craze, NP      Corwin Levins, MD  Electronically Signed    MO/MEDQ  D:  02/19/2007  T:   02/20/2007  Job:  [id number]   cc:   Karie Schwalbe, MD

## 2010-11-21 NOTE — Patient Instructions (Signed)
Please take the cipro antibiotic for the 10 days. Please call if your urine symptoms don't go away

## 2010-11-24 ENCOUNTER — Ambulatory Visit: Payer: MEDICARE | Admitting: Internal Medicine

## 2010-12-08 ENCOUNTER — Ambulatory Visit: Payer: MEDICARE | Admitting: Internal Medicine

## 2010-12-20 ENCOUNTER — Other Ambulatory Visit: Payer: Self-pay | Admitting: *Deleted

## 2010-12-20 MED ORDER — ACYCLOVIR 800 MG PO TABS: 800.0000 mg | ORAL_TABLET | Freq: Four times a day (QID) | ORAL | Status: DC

## 2010-12-20 NOTE — Telephone Encounter (Signed)
Faxed request from [facility] is on your desk. 

## 2010-12-20 NOTE — Telephone Encounter (Signed)
rx sent to pharmacy by e-script  

## 2010-12-21 ENCOUNTER — Other Ambulatory Visit: Payer: Self-pay | Admitting: *Deleted

## 2010-12-21 MED ORDER — MORPHINE SULFATE 60 MG PO TB12: ORAL_TABLET | ORAL | Status: DC

## 2010-12-21 NOTE — Telephone Encounter (Signed)
plz notify pt

## 2010-12-21 NOTE — Telephone Encounter (Signed)
Patient notified and Rx placed up front for pick up. 

## 2011-01-05 ENCOUNTER — Other Ambulatory Visit: Payer: Self-pay | Admitting: *Deleted

## 2011-01-05 MED ORDER — OXYCODONE-ACETAMINOPHEN 5-325 MG PO TABS: 1.0000 | ORAL_TABLET | Freq: Three times a day (TID) | ORAL | Status: DC | PRN

## 2011-01-05 NOTE — Telephone Encounter (Signed)
Spoke with patient and advised results   

## 2011-01-15 ENCOUNTER — Encounter: Payer: Self-pay | Admitting: Internal Medicine

## 2011-01-15 ENCOUNTER — Ambulatory Visit (INDEPENDENT_AMBULATORY_CARE_PROVIDER_SITE_OTHER): Payer: Medicare Other | Admitting: Internal Medicine

## 2011-01-15 VITALS — BP 126/80 | HR 74 | Temp 98.4°F | Ht 66.0 in | Wt 118.0 lb

## 2011-01-15 DIAGNOSIS — S20219A Contusion of unspecified front wall of thorax, initial encounter: Secondary | ICD-10-CM | POA: Insufficient documentation

## 2011-01-15 DIAGNOSIS — R300 Dysuria: Secondary | ICD-10-CM

## 2011-01-15 DIAGNOSIS — K649 Unspecified hemorrhoids: Secondary | ICD-10-CM | POA: Insufficient documentation

## 2011-01-15 DIAGNOSIS — N390 Urinary tract infection, site not specified: Secondary | ICD-10-CM | POA: Insufficient documentation

## 2011-01-15 LAB — POCT URINALYSIS DIPSTICK
Bilirubin, UA: NEGATIVE
Blood, UA: NEGATIVE
Ketones, UA: NEGATIVE
Protein, UA: NEGATIVE
pH, UA: 6

## 2011-01-15 MED ORDER — CIPROFLOXACIN HCL 250 MG PO TABS: 250.0000 mg | ORAL_TABLET | Freq: Two times a day (BID) | ORAL | Status: AC

## 2011-01-15 MED ORDER — HYDROCORTISONE 2.5 % RE CREA: TOPICAL_CREAM | Freq: Three times a day (TID) | RECTAL | Status: AC | PRN

## 2011-01-15 NOTE — Assessment & Plan Note (Signed)
Sounds like she had vagal spell with syncope No apparent rib fracture No pulmonary complications

## 2011-01-15 NOTE — Patient Instructions (Addendum)
Please use the cipro for 3 days at a time when you get recurrences of the urinary burning Please use the prescription cream for your hemorrhoids as needed

## 2011-01-15 NOTE — Assessment & Plan Note (Signed)
Repeated spells of apparent cystitis Will give Rx for intermittent use

## 2011-01-15 NOTE — Progress Notes (Signed)
Subjective:    Patient ID: [name], female    DOB: Mar 03, 1928, 77 y.o.   MRN: [id number]  HPI Continues to have right V1 breakouts Feels "hot" then new lesions  Larey Seat last week---doesn't remember what happened May have passed out while on the cammode and hit head in bathtub Just got up and went back to bed (was in middle of night) Has pain along right flank Thinks it was 6/28  No cough Does have some pain with big breath in No fever  Having urinary burning still The antibiotic did help the last time but recurred  Having trouble with hemorrhoids Come out when she moves bowels  Occ mild blood but they do hurt Vaseline/preparation H do help some  Current Outpatient Prescriptions on File Prior to Visit  Medication Sig Dispense Refill  . acyclovir (ZOVIRAX) 800 MG tablet Take 1 tablet (800 mg total) by mouth 4 (four) times daily.  120 tablet  0  . aspirin 81 MG tablet Take 81 mg by mouth daily.        [name] esomeprazole (NEXIUM) 40 MG packet Take 1 capsule every morning before breakfast and 1 capsule every night at bedtime.  60 each  11  . gabapentin (NEURONTIN) 600 MG tablet Take 600 mg by mouth 2 (two) times daily. And 2 tablets by mouth at bedtime       . hydrOXYzine (ATARAX) 25 MG tablet Take 25-50 mg by mouth at bedtime as needed.        [name] LORazepam (ATIVAN) 1 MG tablet Take 1-2 tablets by mouth at bedtime to help sleep.  60 tablet  3  . morphine (MS CONTIN) 60 MG 12 hr tablet 1 tab by mouth two times a day about 9AM and 9PM for chronic pain  60 tablet  0  . oxyCODONE-acetaminophen (PERCOCET) 5-325 MG per tablet Take 1-2 tablets by mouth 3 (three) times daily as needed.  120 tablet  0  . DISCONTD: bromfenac (XIBROM) 0.09 % ophthalmic solution Place 1 drop into the right eye 2 (two) times daily.          Allergies  Allergen Reactions  . Duloxetine     REACTION: N/V  . Omeprazole     REACTION: Didn't work  . Pregabalin     REACTION: SOB, swollen lips  . Topiramate    REACTION: No help    Past Medical History  Diagnosis Date  . Breast cancer     Bilateral mastectomies 1986.  [name] GERD (gastroesophageal reflux disease)   . Hyperlipidemia   . Arthritis   . Depression   . Zoster      with Post-herpetic neuralgia  . History of partial thyroidectomy   . Dyspnea     Echo (5/11) was a difficult study due to breast implants but showed normal LV and RV size and systolic function.      [name] Atypical chest pain     Lexiscan myoview (5/11) with EF 84%, normal wall motion, small fixed apical  perfusion defect likely due to breast attenuation, no evidence for ischemia or infarction.  **Patient had an    Past Surgical History  Procedure Date  . Abdominal hysterectomy   . Mastectomy 1980    bilateral  . Vaginal delivery     No family history on file.  History   Social History  . Marital Status: Widowed    Spouse Name: N/A    Number of Children: 2  . Years of Education: N/A  Occupational History  . retired Public librarian    Social History Main Topics  . Smoking status: Never Smoker   . Smokeless tobacco: Not on file  . Alcohol Use: No  . Drug Use: No  . Sexually Active: Not on file   Other Topics Concern  . Not on file   Social History Narrative  . No narrative on file   Review of Systems Appetite is not good Weight seems stable    Objective:   Physical Exam  Constitutional: She appears well-developed and well-nourished. No distress.  Neck: Normal range of motion. Neck supple. No thyromegaly present.  Pulmonary/Chest: Effort normal and breath sounds normal. No respiratory distress. She has no wheezes. She has no rales.       Tenderness along right lower ribs No obvious fracture  Abdominal: Soft. There is no tenderness.  Genitourinary:       Small external hemorrhoids with extension internally No thrombosis          Assessment & Plan:

## 2011-01-15 NOTE — Assessment & Plan Note (Signed)
Some pain and bleeding Will try anusol HC cream prn

## 2011-01-24 ENCOUNTER — Other Ambulatory Visit: Payer: Self-pay | Admitting: *Deleted

## 2011-01-24 MED ORDER — GABAPENTIN 600 MG PO TABS: 600.0000 mg | ORAL_TABLET | Freq: Two times a day (BID) | ORAL | Status: DC

## 2011-01-24 MED ORDER — MORPHINE SULFATE 60 MG PO TB12: ORAL_TABLET | ORAL | Status: DC

## 2011-01-24 NOTE — Telephone Encounter (Signed)
Noted. This is fine.

## 2011-01-24 NOTE — Telephone Encounter (Signed)
rx sent to pharmacy by e-script for gabapentin

## 2011-02-08 ENCOUNTER — Other Ambulatory Visit: Payer: Self-pay | Admitting: *Deleted

## 2011-02-08 MED ORDER — ACYCLOVIR 800 MG PO TABS: 800.0000 mg | ORAL_TABLET | Freq: Four times a day (QID) | ORAL | Status: DC

## 2011-02-19 ENCOUNTER — Other Ambulatory Visit: Payer: Self-pay | Admitting: *Deleted

## 2011-02-19 MED ORDER — OXYCODONE-ACETAMINOPHEN 5-325 MG PO TABS: 1.0000 | ORAL_TABLET | Freq: Three times a day (TID) | ORAL | Status: DC | PRN

## 2011-02-19 NOTE — Telephone Encounter (Signed)
Left message on machine that rx is ready for pick-up, and it will be at our front desk.  

## 2011-02-19 NOTE — Telephone Encounter (Signed)
Form on your desk  

## 2011-02-19 NOTE — Telephone Encounter (Signed)
Please call patient for pick up when ready.

## 2011-02-20 MED ORDER — LORAZEPAM 1 MG PO TABS: ORAL_TABLET | ORAL | Status: DC

## 2011-02-20 NOTE — Telephone Encounter (Signed)
rx faxed to pharmacy manually  

## 2011-02-20 NOTE — Telephone Encounter (Signed)
Okay #60 x 3 

## 2011-02-21 ENCOUNTER — Encounter: Payer: Self-pay | Admitting: Internal Medicine

## 2011-02-21 ENCOUNTER — Ambulatory Visit (INDEPENDENT_AMBULATORY_CARE_PROVIDER_SITE_OTHER): Payer: Medicare Other | Admitting: Internal Medicine

## 2011-02-21 DIAGNOSIS — R002 Palpitations: Secondary | ICD-10-CM | POA: Insufficient documentation

## 2011-02-21 DIAGNOSIS — R42 Dizziness and giddiness: Secondary | ICD-10-CM | POA: Insufficient documentation

## 2011-02-21 DIAGNOSIS — B0229 Other postherpetic nervous system involvement: Secondary | ICD-10-CM

## 2011-02-21 MED ORDER — MORPHINE SULFATE 60 MG PO TB12: ORAL_TABLET | ORAL | Status: DC

## 2011-02-21 MED ORDER — MECLIZINE HCL 25 MG PO TABS: 25.0000 mg | ORAL_TABLET | Freq: Three times a day (TID) | ORAL | Status: AC

## 2011-02-21 NOTE — Assessment & Plan Note (Signed)
Ongoing pain Will continue current regimen

## 2011-02-21 NOTE — Assessment & Plan Note (Signed)
Seems to be related to the stress with the vertigo EKG is normal---very reassuring Will check labs

## 2011-02-21 NOTE — Patient Instructions (Signed)
Please use the new medication, meclizine, for the dizziness, 3 times a day When the dizziness is better, you can slowly decrease (like take 2 a day for 3-4 days, then 1 a day for several days then stop)

## 2011-02-21 NOTE — Progress Notes (Signed)
Subjective:    Patient ID: [name], female    DOB: 09/09/1927, 77 y.o.   MRN: [id number]  HPI Having bad dizziness Notes spinning when she stands up or moves head Started 3 days ago Feels sense of shortness of breath Gets sense of heart fluttering which started 3 days ago at church Eyes got blurry All this started about the same time  Still with bad shingles breakouts Severe tenderness if she touches her head Trouble combing hair even  No double vision No clear cut headache (other than the same shingles pain) Has noted some balance problems No falls since last visit--but had to hold on yesterday and sit on couch to avoid a fall  No ringing in ears but they feel stopped up Her hearing is not as good  Current Outpatient Prescriptions on File Prior to Visit  Medication Sig Dispense Refill  . acyclovir (ZOVIRAX) 800 MG tablet Take 1 tablet (800 mg total) by mouth 4 (four) times daily.  120 tablet  0  . aspirin 81 MG tablet Take 81 mg by mouth daily.        [name] esomeprazole (NEXIUM) 40 MG packet Take 1 capsule every morning before breakfast and 1 capsule every night at bedtime.  60 each  11  . gabapentin (NEURONTIN) 600 MG tablet Take 1 tablet (600 mg total) by mouth 2 (two) times daily. And 2 tablets by mouth at bedtime  120 tablet  11  . LORazepam (ATIVAN) 1 MG tablet Take 1-2 tablets by mouth at bedtime to help sleep.  60 tablet  3  . morphine (MS CONTIN) 60 MG 12 hr tablet 1 tab by mouth two times a day about 9AM and 9PM for chronic pain  60 tablet  0  . oxyCODONE-acetaminophen (PERCOCET) 5-325 MG per tablet Take 1-2 tablets by mouth 3 (three) times daily as needed.  120 tablet  0    Allergies  Allergen Reactions  . Duloxetine     REACTION: N/V  . Omeprazole     REACTION: Didn't work  . Pregabalin     REACTION: SOB, swollen lips  . Topiramate     REACTION: No help    Past Medical History  Diagnosis Date  . Breast cancer     Bilateral mastectomies 1986.  [name] GERD  (gastroesophageal reflux disease)   . Hyperlipidemia   . Arthritis   . Depression   . Zoster      with Post-herpetic neuralgia  . History of partial thyroidectomy   . Dyspnea     Echo (5/11) was a difficult study due to breast implants but showed normal LV and RV size and systolic function.      [name] Atypical chest pain     Lexiscan myoview (5/11) with EF 84%, normal wall motion, small fixed apical  perfusion defect likely due to breast attenuation, no evidence for ischemia or infarction.  **Patient had an    Past Surgical History  Procedure Date  . Abdominal hysterectomy   . Mastectomy 1980    bilateral  . Vaginal delivery     No family history on file.  History   Social History  . Marital Status: Widowed    Spouse Name: N/A    Number of Children: 2  . Years of Education: N/A   Occupational History  . retired Public librarian    Social History Main Topics  . Smoking status: Never Smoker   . Smokeless tobacco: Not on file  . Alcohol Use:  No  . Drug Use: No  . Sexually Active: Not on file   Other Topics Concern  . Not on file   Social History Narrative  . No narrative on file   Review of Systems Oxycodone gives some relief of pain but not that great Ongoing sleep problems due to the pain Up several times to void    Objective:   Physical Exam  Constitutional: She appears well-developed and well-nourished.  HENT:  Mouth/Throat: Oropharynx is clear and moist. No oropharyngeal exudate.  Eyes: EOM are normal.       No nystagmus  Neck: No thyromegaly present.  Cardiovascular: Normal rate, regular rhythm and normal heart sounds.  Exam reveals no gallop.   No murmur heard. Pulmonary/Chest: Effort normal and breath sounds normal. No respiratory distress. She has no wheezes. She has no rales.  Musculoskeletal: She exhibits no edema and no tenderness.  Lymphadenopathy:    She has no cervical adenopathy.  Neurological: She displays no tremor. No cranial nerve deficit.  She exhibits normal muscle tone. She displays a negative Romberg sign. Coordination normal.       Not ataxic but gait is tentative Normal tone  Psychiatric:       Anxious with symptoms and ongoing shingles pain          Assessment & Plan:

## 2011-02-21 NOTE — Assessment & Plan Note (Signed)
Clearly seems to be vestibular No signs of stroke Will start meclizine 25 tid and wean as symptoms go away

## 2011-02-28 ENCOUNTER — Telehealth: Payer: Self-pay | Admitting: *Deleted

## 2011-02-28 NOTE — Telephone Encounter (Signed)
Spoke with patient and advised results, she didn't have any questions. 

## 2011-02-28 NOTE — Telephone Encounter (Signed)
That is correct She needs to drink plenty of fluids to keep up with fluid loss Should be seen if sig fever or abdominal pain If she really wants to try something, pepto bismol is safe in this setting

## 2011-02-28 NOTE — Telephone Encounter (Signed)
Pt states she has had diarrhea since yesterday morning.  No fever or nausea, some abd pain but not severe.  She is asking that something be called to [employer].  Advised her that it's best not to stop the diarrhea, best to let it run it's course.  Please advise.

## 2011-03-20 ENCOUNTER — Other Ambulatory Visit: Payer: Self-pay | Admitting: *Deleted

## 2011-03-20 MED ORDER — ACYCLOVIR 800 MG PO TABS: 800.0000 mg | ORAL_TABLET | Freq: Four times a day (QID) | ORAL | Status: DC

## 2011-03-28 ENCOUNTER — Ambulatory Visit (INDEPENDENT_AMBULATORY_CARE_PROVIDER_SITE_OTHER): Payer: Medicare Other | Admitting: Internal Medicine

## 2011-03-28 ENCOUNTER — Encounter: Payer: Self-pay | Admitting: Internal Medicine

## 2011-03-28 VITALS — BP 153/60 | HR 65 | Temp 98.1°F | Ht 66.0 in | Wt 116.0 lb

## 2011-03-28 DIAGNOSIS — R42 Dizziness and giddiness: Secondary | ICD-10-CM

## 2011-03-28 DIAGNOSIS — B0229 Other postherpetic nervous system involvement: Secondary | ICD-10-CM

## 2011-03-28 DIAGNOSIS — Z23 Encounter for immunization: Secondary | ICD-10-CM

## 2011-03-28 DIAGNOSIS — F329 Major depressive disorder, single episode, unspecified: Secondary | ICD-10-CM

## 2011-03-28 DIAGNOSIS — G479 Sleep disorder, unspecified: Secondary | ICD-10-CM

## 2011-03-28 MED ORDER — OXYCODONE-ACETAMINOPHEN 5-325 MG PO TABS: 1.0000 | ORAL_TABLET | Freq: Three times a day (TID) | ORAL | Status: DC | PRN

## 2011-03-28 MED ORDER — NORTRIPTYLINE HCL 25 MG PO CAPS: 25.0000 mg | ORAL_CAPSULE | Freq: Every day | ORAL | Status: DC

## 2011-03-28 MED ORDER — MORPHINE SULFATE 60 MG PO TB12: ORAL_TABLET | ORAL | Status: DC

## 2011-03-28 NOTE — Assessment & Plan Note (Signed)
Better but still has trouble with balance Discussed cane

## 2011-03-28 NOTE — Assessment & Plan Note (Signed)
Continues due to pain Will see if the nortriptylline will help some

## 2011-03-28 NOTE — Assessment & Plan Note (Signed)
Hopefully the nortriptylline can help this some also

## 2011-03-28 NOTE — Patient Instructions (Addendum)
Please start the nortriptylline at bedtime with one capsule. If no side effects after 1 week, increase to 2 capsules at bedtime Call if you have any significant problems with this If you are sleeping well, you can try to do without the lorazepam

## 2011-03-28 NOTE — Assessment & Plan Note (Signed)
Ongoing pain Will continue current regimen Try nortriptylline at bedtime (had amitriptylline in 2008 but I am not sure if she tolerated it or whether it helped)

## 2011-03-28 NOTE — Progress Notes (Signed)
Subjective:    Patient ID: [name], female    DOB: 10-21-1927, 77 y.o.   MRN: [id number]  HPI Still has some vertigo and dizziness but is some better Some trouble with balance and walking No falls Discussed using a cane for balance  Still with ongoing shingles outbreaks Still on the morphine but not all that helpful Uses the percocet regularly and gets some relief  Ongoing sleep problems Some help with the lorazepam  Still depressed due to constant pain  Current Outpatient Prescriptions on File Prior to Visit  Medication Sig Dispense Refill  . acyclovir (ZOVIRAX) 800 MG tablet Take 1 tablet (800 mg total) by mouth 4 (four) times daily.  120 tablet  11  . aspirin 81 MG tablet Take 81 mg by mouth daily.        [name] esomeprazole (NEXIUM) 40 MG packet Take 1 capsule every morning before breakfast and 1 capsule every night at bedtime.  60 each  11  . gabapentin (NEURONTIN) 600 MG tablet Take 1 tablet (600 mg total) by mouth 2 (two) times daily. And 2 tablets by mouth at bedtime  120 tablet  11  . LORazepam (ATIVAN) 1 MG tablet Take 1-2 tablets by mouth at bedtime to help sleep.  60 tablet  3  . morphine (MS CONTIN) 60 MG 12 hr tablet 1 tab by mouth two times a day about 9AM and 9PM for chronic pain  60 tablet  0  . oxyCODONE-acetaminophen (PERCOCET) 5-325 MG per tablet Take 1-2 tablets by mouth 3 (three) times daily as needed.  120 tablet  0    Allergies  Allergen Reactions  . Duloxetine     REACTION: N/V  . Omeprazole     REACTION: Didn't work  . Pregabalin     REACTION: SOB, swollen lips  . Topiramate     REACTION: No help    Past Medical History  Diagnosis Date  . Breast cancer     Bilateral mastectomies 1986.  [name] GERD (gastroesophageal reflux disease)   . Hyperlipidemia   . Arthritis   . Depression   . Zoster      with Post-herpetic neuralgia  . History of partial thyroidectomy   . Dyspnea     Echo (5/11) was a difficult study due to breast implants but showed  normal LV and RV size and systolic function.      [name] Atypical chest pain     Lexiscan myoview (5/11) with EF 84%, normal wall motion, small fixed apical  perfusion defect likely due to breast attenuation, no evidence for ischemia or infarction.  **Patient had an    Past Surgical History  Procedure Date  . Abdominal hysterectomy   . Mastectomy 1980    bilateral  . Vaginal delivery     No family history on file.  History   Social History  . Marital Status: Widowed    Spouse Name: N/A    Number of Children: 2  . Years of Education: N/A   Occupational History  . retired Public librarian    Social History Main Topics  . Smoking status: Never Smoker   . Smokeless tobacco: Never Used  . Alcohol Use: No  . Drug Use: No  . Sexually Active: Not on file   Other Topics Concern  . Not on file   Social History Narrative  . No narrative on file   Review of Systems Had sugar checked at fire department ("a little low") BP was okay there  Feels "hunk of phlegm" at night No hay fever    Objective:   Physical Exam  Constitutional: She appears well-developed and well-nourished. No distress.  Neck: Normal range of motion. Neck supple. No thyromegaly present.  Cardiovascular: Normal rate, regular rhythm and normal heart sounds.  Exam reveals no gallop.   No murmur heard. Pulmonary/Chest: Effort normal and breath sounds normal. No respiratory distress. She has no wheezes. She has no rales.  Musculoskeletal: Normal range of motion. She exhibits no edema and no tenderness.  Lymphadenopathy:    She has no cervical adenopathy.  Skin:       Still with some active zoster lesions in right V1 distribution  Psychiatric: Her behavior is normal. Judgment and thought content normal.       Dysthymic mood but appropriate affect          Assessment & Plan:

## 2011-04-09 DIAGNOSIS — I639 Cerebral infarction, unspecified: Secondary | ICD-10-CM

## 2011-04-09 HISTORY — DX: Cerebral infarction, unspecified: I63.9

## 2011-04-23 LAB — CBC
HCT: 32.3 — ABNORMAL LOW
Hemoglobin: 11.9 — ABNORMAL LOW
Hemoglobin: 12.4
MCHC: 33.5
MCHC: 33.9
MCV: 87.6
MCV: 88.2
Platelets: 153
RBC: 3.69 — ABNORMAL LOW
RBC: 4.55
RDW: 13.4
RDW: 13.8
RDW: 14
WBC: 6.4

## 2011-04-23 LAB — BASIC METABOLIC PANEL
BUN: 5 — ABNORMAL LOW
CO2: 24
Calcium: 8.4
Calcium: 8.5
Chloride: 105
Creatinine, Ser: 0.7
Creatinine, Ser: 0.96
GFR calc Af Amer: >60
GFR calc non Af Amer: >60
GFR calc non Af Amer: >60
Glucose, Bld: 123 — ABNORMAL HIGH
Glucose, Bld: 125 — ABNORMAL HIGH
Potassium: 3.5
Sodium: 136
Sodium: 138
Sodium: 142

## 2011-04-23 LAB — DIFFERENTIAL
Basophils Absolute: 0
Lymphocytes Relative: 13
Lymphocytes Relative: 7 — ABNORMAL LOW
Lymphs Abs: 1.3
Neutro Abs: 9.1 — ABNORMAL HIGH
Neutrophils Relative %: 77
Neutrophils Relative %: 88 — ABNORMAL HIGH

## 2011-04-23 LAB — COMPREHENSIVE METABOLIC PANEL
AST: 50 — ABNORMAL HIGH
CO2: 25
Calcium: 8.6
Creatinine, Ser: 1.17
GFR calc Af Amer: 54 — ABNORMAL LOW
GFR calc non Af Amer: 45 — ABNORMAL LOW
Glucose, Bld: 113 — ABNORMAL HIGH
Total Protein: 6.5

## 2011-04-23 LAB — URINE MICROSCOPIC-ADD ON

## 2011-04-23 LAB — URINALYSIS, ROUTINE W REFLEX MICROSCOPIC
Nitrite: NEGATIVE
Specific Gravity, Urine: 1.013
pH: 6

## 2011-04-23 LAB — CULTURE, BLOOD (ROUTINE X 2)

## 2011-04-23 LAB — POCT CARDIAC MARKERS: Troponin i, poc: <0.05

## 2011-04-26 LAB — DIFFERENTIAL
Basophils Absolute: 0
Basophils Relative: 1
Lymphocytes Relative: 28
Monocytes Absolute: 0.6
Neutro Abs: 4.9
Neutrophils Relative %: 63

## 2011-04-26 LAB — COMPREHENSIVE METABOLIC PANEL
Albumin: 3.5
BUN: 11
Chloride: 103
Creatinine, Ser: 0.65
Glucose, Bld: 107 — ABNORMAL HIGH
Total Bilirubin: 0.8
Total Protein: 6.4

## 2011-04-26 LAB — B-NATRIURETIC PEPTIDE (CONVERTED LAB): Pro B Natriuretic peptide (BNP): 55

## 2011-04-26 LAB — POCT CARDIAC MARKERS: CKMB, poc: <1 — ABNORMAL LOW

## 2011-04-26 LAB — CBC
HCT: 39.4
Hemoglobin: 13.2
MCV: 87.8
Platelets: 200
RDW: 14.2 — ABNORMAL HIGH

## 2011-04-26 LAB — D-DIMER, QUANTITATIVE: D-Dimer, Quant: 0.88 — ABNORMAL HIGH

## 2011-04-26 LAB — LIPASE, BLOOD: Lipase: 22

## 2011-05-02 ENCOUNTER — Emergency Department (HOSPITAL_COMMUNITY): Payer: Medicare Other

## 2011-05-02 ENCOUNTER — Inpatient Hospital Stay (HOSPITAL_COMMUNITY)
Admission: EM | Admit: 2011-05-02 | Discharge: 2011-05-10 | DRG: 065 | Disposition: A | Discharge status: Skilled Nursing Facility | Payer: Medicare Other | Attending: Internal Medicine | Admitting: Internal Medicine

## 2011-05-02 DIAGNOSIS — R55 Syncope and collapse: Secondary | ICD-10-CM | POA: Diagnosis present

## 2011-05-02 DIAGNOSIS — G894 Chronic pain syndrome: Secondary | ICD-10-CM | POA: Diagnosis present

## 2011-05-02 DIAGNOSIS — R296 Repeated falls: Secondary | ICD-10-CM | POA: Diagnosis present

## 2011-05-02 DIAGNOSIS — I472 Ventricular tachycardia, unspecified: Secondary | ICD-10-CM | POA: Diagnosis present

## 2011-05-02 DIAGNOSIS — K219 Gastro-esophageal reflux disease without esophagitis: Secondary | ICD-10-CM | POA: Diagnosis present

## 2011-05-02 DIAGNOSIS — E876 Hypokalemia: Secondary | ICD-10-CM | POA: Diagnosis present

## 2011-05-02 DIAGNOSIS — Z7982 Long term (current) use of aspirin: Secondary | ICD-10-CM

## 2011-05-02 DIAGNOSIS — I4729 Other ventricular tachycardia: Secondary | ICD-10-CM | POA: Diagnosis present

## 2011-05-02 DIAGNOSIS — K5900 Constipation, unspecified: Secondary | ICD-10-CM | POA: Diagnosis present

## 2011-05-02 DIAGNOSIS — I6350 Cerebral infarction due to unspecified occlusion or stenosis of unspecified cerebral artery: Principal | ICD-10-CM | POA: Diagnosis present

## 2011-05-02 DIAGNOSIS — R51 Headache: Secondary | ICD-10-CM | POA: Diagnosis present

## 2011-05-02 DIAGNOSIS — E440 Moderate protein-calorie malnutrition: Secondary | ICD-10-CM | POA: Diagnosis present

## 2011-05-02 DIAGNOSIS — R059 Cough, unspecified: Secondary | ICD-10-CM | POA: Diagnosis present

## 2011-05-02 DIAGNOSIS — R269 Unspecified abnormalities of gait and mobility: Secondary | ICD-10-CM | POA: Diagnosis present

## 2011-05-02 DIAGNOSIS — R05 Cough: Secondary | ICD-10-CM | POA: Diagnosis present

## 2011-05-02 DIAGNOSIS — E860 Dehydration: Secondary | ICD-10-CM | POA: Diagnosis present

## 2011-05-02 DIAGNOSIS — D649 Anemia, unspecified: Secondary | ICD-10-CM | POA: Diagnosis present

## 2011-05-02 DIAGNOSIS — E785 Hyperlipidemia, unspecified: Secondary | ICD-10-CM | POA: Diagnosis present

## 2011-05-02 DIAGNOSIS — R42 Dizziness and giddiness: Secondary | ICD-10-CM | POA: Diagnosis present

## 2011-05-02 LAB — CBC
HCT: 35.9 % — ABNORMAL LOW (ref 36.0–46.0)
Hemoglobin: 12 g/dL (ref 12.0–15.0)
MCH: 28.8 pg (ref 26.0–34.0)
MCV: 86.3 fL (ref 78.0–100.0)
Platelets: 171 10*3/uL (ref 150–400)
RBC: 4.16 MIL/uL (ref 3.87–5.11)
WBC: 8.7 10*3/uL (ref 4.0–10.5)

## 2011-05-02 LAB — COMPREHENSIVE METABOLIC PANEL
AST: 18 U/L (ref 0–37)
BUN: 12 mg/dL (ref 6–23)
CO2: 26 mEq/L (ref 19–32)
Calcium: 9.6 mg/dL (ref 8.4–10.5)
Chloride: 101 mEq/L (ref 96–112)
Creatinine, Ser: 0.67 mg/dL (ref 0.50–1.10)
GFR calc Af Amer: >90 mL/min (ref >90)
GFR calc non Af Amer: 80 mL/min — ABNORMAL LOW (ref >90)
Glucose, Bld: 122 mg/dL — ABNORMAL HIGH (ref 70–99)
Total Bilirubin: 0.4 mg/dL (ref 0.3–1.2)

## 2011-05-02 LAB — POCT I-STAT TROPONIN I: Troponin i, poc: 0 ng/mL (ref 0.00–0.08)

## 2011-05-02 LAB — DIFFERENTIAL
Eosinophils Relative: 1 % (ref 0–5)
Lymphocytes Relative: 13 % (ref 12–46)
Lymphs Abs: 1.1 10*3/uL (ref 0.7–4.0)
Monocytes Absolute: 0.8 10*3/uL (ref 0.1–1.0)
Monocytes Relative: 9 % (ref 3–12)
Neutro Abs: 6.7 10*3/uL (ref 1.7–7.7)

## 2011-05-02 LAB — URINALYSIS, ROUTINE W REFLEX MICROSCOPIC
Nitrite: NEGATIVE
Protein, ur: NEGATIVE mg/dL
Urobilinogen, UA: 0.2 mg/dL (ref 0.0–1.0)

## 2011-05-02 LAB — URINE MICROSCOPIC-ADD ON

## 2011-05-03 ENCOUNTER — Inpatient Hospital Stay (HOSPITAL_COMMUNITY): Payer: Medicare Other

## 2011-05-03 DIAGNOSIS — R55 Syncope and collapse: Secondary | ICD-10-CM

## 2011-05-03 DIAGNOSIS — I319 Disease of pericardium, unspecified: Secondary | ICD-10-CM

## 2011-05-03 LAB — DIFFERENTIAL
Basophils Absolute: 0 10*3/uL (ref 0.0–0.1)
Basophils Relative: 0 % (ref 0–1)
Eosinophils Relative: 2 % (ref 0–5)
Monocytes Absolute: 0.8 10*3/uL (ref 0.1–1.0)
Neutro Abs: 4.5 10*3/uL (ref 1.7–7.7)

## 2011-05-03 LAB — COMPREHENSIVE METABOLIC PANEL
Alkaline Phosphatase: 71 U/L (ref 39–117)
BUN: 11 mg/dL (ref 6–23)
Calcium: 8.6 mg/dL (ref 8.4–10.5)
Creatinine, Ser: 0.6 mg/dL (ref 0.50–1.10)
GFR calc Af Amer: >90 mL/min (ref >90)
Glucose, Bld: 103 mg/dL — ABNORMAL HIGH (ref 70–99)
Total Protein: 6.4 g/dL (ref 6.0–8.3)

## 2011-05-03 LAB — CARDIAC PANEL(CRET KIN+CKTOT+MB+TROPI)
CK, MB: 3.2 ng/mL (ref 0.3–4.0)
CK, MB: 3 ng/mL (ref 0.3–4.0)
CK, MB: 2.8 ng/mL (ref 0.3–4.0)
Relative Index: INVALID (ref 0.0–2.5)
Total CK: 85 U/L (ref 7–177)
Troponin I: <0.3 ng/mL (ref <0.30)
Troponin I: <0.3 ng/mL (ref <0.30)

## 2011-05-03 LAB — URINE CULTURE
Colony Count: 25000
Culture  Setup Time: 201210241746

## 2011-05-03 LAB — CBC
MCHC: 33 g/dL (ref 30.0–36.0)
RDW: 15.1 % (ref 11.5–15.5)

## 2011-05-03 LAB — T3, FREE: T3, Free: 2 pg/mL — ABNORMAL LOW (ref 2.3–4.2)

## 2011-05-03 LAB — MAGNESIUM: Magnesium: 1.8 mg/dL (ref 1.5–2.5)

## 2011-05-03 NOTE — H&P (Signed)
NAMESONNI, BARSE NO.:  [id number]  MEDICAL RECORD NO.:  [id number]  LOCATION:  [facility]                         FACILITY:  [facility]  PHYSICIAN:  Talmage Nap, MD  DATE OF BIRTH:  Sep 24, 1927  DATE OF ADMISSION:  05/02/2011 DATE OF DISCHARGE:                             HISTORY & PHYSICAL   PRIMARY CARE PHYSICIAN:  Karie Schwalbe, MD  History was obtainable from the patient.  CHIEF COMPLAINT:  "I do not know what happened, I fell and saw blood on my pillow," this happened this afternoon.  The patient is an 77 year old Caucasian female with history of herpes zoster and postherpetic neuralgia on the right side of the face, a resident of the [facility] which is an assisted living facility, was seen by me in the emergency room because of patient sustaining laceration at the back of her head and also questionable passing out.  According to the patient, she said for the past 2 days she has been having aches and pain and cough that was nonproductive of sputum.  However, she denied any chest pain.  She denied any shortness of breath.  This afternoon, she says she was lying on the bedtime and saw pillow on her floor.  She claims she could have fallen over, probably while using the bathroom but could not recall what happened, all she recall is that she saw blood in her pillow and at the same time, she was complaining about headaches and blurry vision and nausea, but she denied any vomiting.  She denied any chest pain.  She denied any shortness of breath.  She denied any cough, she denied any fever, chills, or rigors and subsequently was brought by family to the hospital for evaluation.  PAST MEDICAL HISTORY:  Positive for: 1. Herpes zoster. 2. Postherpetic neuralgia. 3. GERD. 4. Goiter.  PAST SURGICAL HISTORY: 1. Goiter status post thyroidectomy. 2. Hysterectomy.  PREADMISSION MEDICATIONS WITHOUT DOSAGES:  Acyclovir, aspirin, __________,  esomeprazole, magnesium, lorazepam, morphine, nortriptyline, omeprazole, oxycodone/acetaminophen, pregabalin, topiramate.  She has no known allergies.  SOCIAL HISTORY:  Negative for alcohol, tobacco use.  She is a resident of the [facility], which is an assisted living facility by [employer].  FAMILY HISTORY:  Mother died of acute myocardial infarction.  REVIEW OF SYSTEMS:  The patient complained of headaches, nausea.  She denies any fever.  She denied any chills.  She denied any rigor.  She complained of mild precordial discomfort, but no associated shortness of breath, or PND or orthopnea.  She denies any cough.  No abdominal discomfort.  No diarrhea or hematochezia.  No dysuria, hematuria. Complained about pain in the lower extremities.  No intolerance to heat or cold and no neuropsychiatric disorder.  PHYSICAL EXAMINATION:  GENERAL:  An elderly lady, looking very miserable, dehydrated, not in any respiratory distress. PRESENT VITAL SIGNS:  Blood pressure is 152/70, pulse is 77, respiratory rate is 17, temperature 97.8. HEENT:  Pupils are reactive to light and extraocular muscles are intact. NECK:  No jugular venous distention.  No carotid bruit.  No lymphadenopathy. CHEST:  Clear to auscultation. HEART:  Sounds are 1 and 2. ABDOMEN:  Soft, nontender.  Liver, spleen, and kidney are not palpable. Bowel sounds are positive. EXTREMITIES:  No pedal edema. NEUROLOGIC:  Nonfocal. MUSCULOSKELETAL:  Arthritic changes in the knees and in the feet. NEUROPSYCHIATRIC:  Unremarkable. SKIN:   Sutured laceration in the occipital region.  LABORATORY DATA:  Baseline hematologic indices showed WBC of 8.7, hemoglobin of 12.0, hematocrit of 35.9, MCV of 86.3 with a platelet count of 171 with normal differential.  Baseline chemistry showed sodium of 138, potassium of 3.7, chloride of 101 with a bicarb of 26, glucose is 122, BUN is 12, creatinine is 0.67.  LFTs are normal.   Urinalysis unremarkable.  Urine microscopy unremarkable.  First set of cardiac marker troponin I less than 0.00.  IMAGING STUDIES:  Done on the patient include:  Chest x-ray which showed no acute cardiopulmonary process.  CT of the head without contrast showed chronic right temporal lobe infarct, no acute abnormalities seen, and EKG showed normal sinus rhythm with no acute ST-wave change noted.  IMPRESSION: 1. Syncope. 2. Fall. 3. Laceration, occipital region. 4. Dehydration. 5. Headaches. 6. History of herpes zoster/postherpetic neuralgia. 7. Gastroesophageal reflux disease. 8. Polypharmacy.  Plan is to admit the patient to telemetry.  The patient will be slowly rehydrated with normal saline IV to go at a rate of 65 mL an hour.  Her headache will be controlled with Percocet 5/325 one to two tablets p.o. q.4 h. p.r.n. for pain and headache.  She will be on aspirin 81 mg p.o. daily, Lyrica 75 mg p.o. b.i.d. for her postherpetic neuralgia.  GI prophylaxis will be done with Protonix 40 mg IV q.24 h. and TED stockings for DVT prophylaxis.  Further workup to be ordered on this patient will include cardiac enzymes q.6 h. x3, a 2-D echo, carotid duplex, TSH, T3, and T4.  CBC, CMP, and magnesium will be repeated in a.m., and finally, physical therapy will be consulted for gradual ambulation of this patient.  The patient will be followed and evaluated on day-to-day basis.     Talmage Nap, MD     CN/MEDQ  D:  05/02/2011  T:  05/02/2011  Job:  [id number]  Electronically Signed by Talmage Nap  on 05/03/2011 06:44:50 PM

## 2011-05-04 ENCOUNTER — Inpatient Hospital Stay (HOSPITAL_COMMUNITY): Payer: Medicare Other

## 2011-05-04 LAB — MAGNESIUM: Magnesium: 1.9 mg/dL (ref 1.5–2.5)

## 2011-05-04 LAB — BASIC METABOLIC PANEL
Calcium: 8.3 mg/dL — ABNORMAL LOW (ref 8.4–10.5)
Chloride: 109 mEq/L (ref 96–112)
Creatinine, Ser: 0.61 mg/dL (ref 0.50–1.10)
GFR calc Af Amer: >90 mL/min (ref >90)
GFR calc non Af Amer: 83 mL/min — ABNORMAL LOW (ref >90)

## 2011-05-04 LAB — CBC
MCV: 87.3 fL (ref 78.0–100.0)
Platelets: 148 10*3/uL — ABNORMAL LOW (ref 150–400)
RDW: 15.1 % (ref 11.5–15.5)
WBC: 6.6 10*3/uL (ref 4.0–10.5)

## 2011-05-04 MED ORDER — IOHEXOL 300 MG/ML  SOLN
100.0000 mL | Freq: Once | INTRAMUSCULAR | Status: AC | PRN
  Administered 2011-05-04: 100 mL via INTRAVENOUS

## 2011-05-05 ENCOUNTER — Inpatient Hospital Stay (HOSPITAL_COMMUNITY): Payer: Medicare Other

## 2011-05-05 LAB — URINALYSIS, ROUTINE W REFLEX MICROSCOPIC
Glucose, UA: NEGATIVE mg/dL
Leukocytes, UA: NEGATIVE
Specific Gravity, Urine: 1.007 (ref 1.005–1.030)
pH: 6 (ref 5.0–8.0)

## 2011-05-05 LAB — BASIC METABOLIC PANEL
CO2: 24 mEq/L (ref 19–32)
Calcium: 8.4 mg/dL (ref 8.4–10.5)
Glucose, Bld: 87 mg/dL (ref 70–99)
Potassium: 3.9 mEq/L (ref 3.5–5.1)
Sodium: 141 mEq/L (ref 135–145)

## 2011-05-05 LAB — CBC
MCH: 27.8 pg (ref 26.0–34.0)
MCHC: 31.8 g/dL (ref 30.0–36.0)
Platelets: 157 10*3/uL (ref 150–400)
RBC: 3.42 MIL/uL — ABNORMAL LOW (ref 3.87–5.11)
RDW: 15.2 % (ref 11.5–15.5)

## 2011-05-05 LAB — URINE MICROSCOPIC-ADD ON

## 2011-05-05 LAB — IRON AND TIBC
Iron: 13 ug/dL — ABNORMAL LOW (ref 42–135)
TIBC: 247 ug/dL — ABNORMAL LOW (ref 250–470)

## 2011-05-05 MED ORDER — GADOBENATE DIMEGLUMINE 529 MG/ML IV SOLN: 11.0000 mL | Freq: Once | INTRAVENOUS | Status: AC | PRN

## 2011-05-05 NOTE — Consult Note (Signed)
[name], [name]                ACCOUNT NO.:  [id number]  MEDICAL RECORD NO.:  [id number]  LOCATION:  3733                         FACILITY:  [facility]  PHYSICIAN:  Carmell Austria, MD        DATE OF BIRTH:  04/14/1928  DATE OF CONSULTATION: DATE OF DISCHARGE:                                CONSULTATION   CC: "syncope" and "CVA"  HPI: An 77 years old woman with a history of herpes zoster, postherpetic neuralgia, GERD, thyroid goiter who presented due to feeling lightheaded while brushing her harin in the bathroom this past Tuesday. She subsequently found herself in her bed with blood in the back of her head and a bruise.  She does not remember how she got to the bed. She suspected that she may have fallen somehow and then gotten into the bed, but cannot recall it.  Patient reports multiple episodes of feeling like she was going to  pass out previously. She had an MRI which showed right inferior frontal gyrus lacunar  infarct and Neurology consult was called to evaluate both of these problems.  SURGICAL HISTORY:  Thyroidectomy for her goiter and hysterectomy.  MEDICATIONS:  Acyclovir, aspirin, esomeprazole, magnesium, Ativan, morphine, nortriptyline, omeprazole, Percocet.  ALLERGIES:  She has no drug allergies.  SOCIAL HISTORY:  Negative for alcohol, tobacco use.  She resides in [facility], which is an assisted living facility.  No drug use.  FAMILY HISTORY:  Non-contributory.  REVIEW OF SYSTEMS:  The patient is complaining of headaches and nausea. Otherwise, review of systems is unremarkable.  PHYSICAL EXAMINATION:  VITAL SIGNS:  Temperature 98.6, pulse 76, respirations 19, blood pressure 136/80. NEUROLOGIC:  She is awake, alert, and oriented x3.  No aphasia.  Follows complex commands, good current fund of knowledge, able to tell me months of year forwards and backwards. CRANIAL NERVE:  Extraocular movements intact.  Pupils equal, round, reactive to light and  accommodation in the left eye.  Her right eye is opacified.  There was no facial asymmetry.  Tongue was midline. Sensation was intact to V1-V3 bilaterally.  On motor exam, there was no drift of her arms.  Her strength was 5/5 in all extremities.  There was no deficit to light touch, pinprick throughout and her sensory exam, coordination, and her finger-to-nose was intact bilaterally.  Her reflexes were 2+ in upper extremities, 1+ in lower extremities, plantars were mute.  Her gait was deferred due to lightheadedness.  The patient complains of spinning sensation while sitting up as well as when turning her head.  LABS:  CBC 6.6, H and H 9.7, 29.6, platelets 148.  Sodium 142, potassium 2.8, chloride 109, bicarb of 25, BUN 7, creatinine 0.61, glucose 89, calcium 8.3.  MRI showed right acute inferior frontal gyrus stroke as well as chronic right MCA stroke.  IMPRESSION:  An 77 year old woman with an episode which is suspicious for syncope as a reason for  why she came to the hospital. She fell and hit her head  and since that time, she is complaining of vertiginous symptoms, which maybe due to vertebrobasilar insufficiency or maybe due to postconcussive symptoms as she hit and she fell on back  of her head.  She should get an MRA of head and neck to make sure she does not have vertebrobasilar insufficiency.   She has been on telemetry and I agree with keeping her here for now.   Meclizine 25 mg by mouth q.6 h. p.r.n., can be tried for her vertiginous symptoms.   If she is lightheaded and orthostatic on standing up, she may require treatment such as compression stockings or midodrine or fludrocortisone, but given her presentation, the likely reason, she is in hospital due to syncope and subsequent symptoms are secondary to concussion as a result of fall and trauma to the head.  I also recommend Ophthalmology consult for as she has a chronic dry right eye with herpes zoster and she needs an  eye patch as of now until Ophthalmology recommends further treatment.          ______________________________ Carmell Austria, MD     DB/MEDQ  D:  05/04/2011  T:  05/05/2011  Job:  [id number]  Electronically Signed by Carmell Austria MD on 05/05/2011 04:09:23 PM

## 2011-05-06 ENCOUNTER — Inpatient Hospital Stay (HOSPITAL_COMMUNITY): Payer: Medicare Other

## 2011-05-06 LAB — LIPID PANEL
HDL: 46 mg/dL (ref >39)
LDL Cholesterol: 77 mg/dL (ref 0–99)
Total CHOL/HDL Ratio: 3.1 RATIO

## 2011-05-06 LAB — HEMOGLOBIN A1C: Hgb A1c MFr Bld: 6.1 % — ABNORMAL HIGH (ref <5.7)

## 2011-05-09 NOTE — Discharge Summary (Signed)
[name], [name]                ACCOUNT NO.:  [id number]  MEDICAL RECORD NO.:  [id number]  LOCATION:  3733                         FACILITY:  [facility]  PHYSICIAN:  Elizabeth Massed, MD    DATE OF BIRTH:  1928/06/13  DATE OF ADMISSION:  05/02/2011 DATE OF DISCHARGE:                        DISCHARGE SUMMARY - REFERRING   PRIMARY CARE PRACTITIONER:  Karie Schwalbe, MD  PRIMARY DISCHARGE DIAGNOSES: 1. Acute cerebrovascular accident. 2. Acute-on-chronic vertigo. 3. Anemia, multifactorial. 4. Runs of supraventricular tachycardia. 5. Protein-calorie malnutrition -- moderate.  PAST MEDICAL HISTORY/SECONDARY DISCHARGE DIAGNOSES: 1. History of chronic headaches. 2. History of chronic pain syndrome, on chronic narcotic therapy as an     outpatient. 3. History of postherpetic neuralgia involving the right orbital area. 4. History of chronic vertigo. 5. History of gastroesophageal reflux disease. 6. History of dyslipidemia.  DISCHARGE MEDICATIONS:  Will be dictated by the discharging physician.  CONSULTANTS ON THE CASE: 1. Neurology. 2. Ophthalmology.  BRIEF HISTORY OF PRESENT ILLNESS:  The patient is an 77 year old female with the above-noted medical issues who presented to the ED on October 24 with a questionable syncopal episode.  She was then admitted to the hospitalist service for further evaluation and treatment.  Please note that she does have a history of chronic headaches and chronic vertigo as well.  For further details regarding the history and physical, please see the one that was dictated by Dr. Beverly Gust on admission.  PERTINENT RADIOLOGICAL STUDIES: 1. MRI of the brain without contrast showed a subcentimeter right     inferior frontal gyrus lacunar-type infarct.  Underlying chronic     right MCA infarct. 2. CT of the abdomen and pelvis with contrast showed trace amount of     fluid within the pelvis.  The source of this is indeterminate as a     discrete bowel  or visceral injury is not identified or noted above.     Nonobstructing right renal calculi and 2 cm left renal pelvis     nonobstructing stone. 3. MRA of the neck showed no carotid or vertebral artery     hemodynamically significant stenosis.  Suggestion of right ICA FMD.     Moderate-to-severe mid left subclavian artery stenosis. 4. MRA of the brain without contrast shows severe intracranial     atherosclerosis.  Chronic right MCA occlusion.  Moderate and     occasionally severe stenosis in the medium-size circle of Willis     branches diffusely.  No other major branch occlusion.  2D echocardiogram without contrast shows a systolic function of around 55-60%.  PERTINENT LABORATORY DATA: 1. HB A1c 6.1. 2. LDL cholesterol 77. 3. Vitamin B12 of 274. 4. Ferritin 12. 5. Iron panel shows an iron of 13, total iron binding capacity of 247,     percent saturation of 5. 6. TSH of 0.365.  BRIEF HOSPITAL COURSE: 1. Acute CVA.  The patient was admitted with questionable syncopal     episode.  She continued to have dizziness during the hospital stay     intermittently.  Subsequent studies which included a MRI of the     brain showed a subcentimeter right inferior frontal gyrus.  Neurology was consulted.  Neurology contained to recommend     antiplatelet agents.  She will continue with aspirin and     simvastatin. 2. Syncope.  It is highly unlikely that her cause of the syncope is     from a CVA, again the syncope is even questionable.  The patient     does have history of having chronic vertigos in the past.  In the     hospital, she was noted to have runs of SVT.  Her syncope workup so     far is unrevealing except for occasional SVTs in the hospital.  She     has been placed on Lopressor and for the past 48 hours, she has     been SVT free on her telemetry strips. 3. Vertigo.  This seems to be a chronic issue for the patient.  This     has persistent intermittently in the hospital,  however again today     this seems to have reoccurred.  From the history obtained, this     mostly appears to be positional and as long as the patient is lying     still, it is not an issue but the moment she tries to get up then     it becomes an issue.  I will continue to have physical therapy do     vestibular maneuvers on her and will also place her on scheduled     meclizine.  The patient lives alone and at this time, it is our     recommendation that she be transferred to a skilled nursing     facility on discharge. 4. Cough.  The patient did complain of some cough during the hospital     stay.  A repeat chest x-ray done on May 06, 2011 did not show     anything acute.  She is already maintained on Protonix.  Because of     questionable reactive airway disease, she has been placed on p.r.n.     albuterol inhaler.  She has also been given antitussives as needed.     Her cough is much better today.  Please note that the cough is     mostly dry, and the patient has been afebrile. 5. Anemia.  Her last hemoglobin done on May 05, 2011 is 9.5.     Again this anemia is likely multifactorial.  She has been started     on iron and vitamin B12 supplementation.  Further workup for this     anemia can be done as an outpatient by her primary care     practitioner. 6. Postherpetic neuralgia.  This is a chronic issue.  She has been     seen by Ophthalmology during this admission as well.  She is to     continue Lyrica, nortriptyline, and acyclovir. 7. Chronic headaches and chronic pain syndrome.  The patient is to     continue her usual chronic narcotics.  We will go ahead and place     her on MiraLax and Senokot as scheduled for constipation.  DISPOSITION:  It is felt that this patient will benefit from skilled nursing facility placement at discharge.  We are currently awaiting social worker evaluation for this.  FOLLOW-UP INSTRUCTIONS: 1. The patient will need to follow up with her  primary care     practitioner, Dr. Alphonsus Sias, upon discharge from the skilled nursing     facility. 2. The patient will need to follow  up with [facility] upon     discharge from the skilled nursing facility. 3. The patient will need to follow up with her primary ophthalmologist     upon discharge from the skilled nursing facility.  Please note that if there are any changes to the patient's hospital course, her discharge medications that will be dictated as an addendum by the discharging physician.  TOTAL TIME SPENT:  45 minutes.     Elizabeth Massed, MD     SG/MEDQ  D:  05/08/2011  T:  05/08/2011  Job:  [id number]  cc:   Karie Schwalbe, MD  Electronically Signed by [name]  on 05/09/2011 12:54:06 PM

## 2011-05-10 ENCOUNTER — Encounter: Payer: Self-pay | Admitting: Internal Medicine

## 2011-05-11 NOTE — Discharge Summary (Signed)
  NAMEKORINNE, Downs                ACCOUNT NO.:  [id number]  MEDICAL RECORD NO.:  [id number]  LOCATION:  3733                         FACILITY:  [facility]  PHYSICIAN:  Lonia Blood, M.D.       DATE OF BIRTH:  1927-07-23  DATE OF ADMISSION:  05/02/2011 DATE OF DISCHARGE:  05/10/2011                              DISCHARGE SUMMARY   PRIMARY CARE PHYSICIAN:  Karie Schwalbe, MD  DISCHARGE DIAGNOSES: 1. Right inferior frontal gyrus lacunar-type infarct. 2. Chronic right middle cerebral artery infarct. 3. Postherpetic neuralgia and corneal opacification of the right eye. 4. Anemia-of-chronic disease. 5. Chronic headaches. 6. Chronic pain syndrome. 7. Gastroesophageal reflux disease. 8. Vertigo multifactorial. 9. Gait abnormality multifactorial. 10.Cough.  DISCHARGE MEDICATIONS: 1. Albuterol 2 puffs inhaled 4 times a day as needed. 2. Artificial Tears 1 drop in both eyes 4 times a day. 3. Vitamin B12 1000 mcg intramuscularly daily. 4. Ensure 1 can by mouth 3 times a day. 5. Iron 325 mg by mouth 3 times a day with meals. 6. Meclizine 25 mg by mouth 3 times a day as needed for dizziness. 7. Metoprolol 25 mg twice a day. 8. Zofran 4 mg every 6 hours as needed for nausea. 9. MiraLax 17 g daily. 10.Zocor 20 mg daily. 11.Acyclovir 800 mg 3 times a day. 12.Lorazepam 1 mg daily at bedtime. 13.Aspirin 81 mg daily. 14.Nexium 40 mg twice a day. 15.Gabapentin 600 mg to take 1200 mg every morning and at bedtime. 16.MS Contin 60 mg 1 tablet at 9 a.m. and 1 tablet at 9 p.m. 17.Nortriptyline 25 mg 1-2 capsules by mouth daily at bedtime. 18.Percocet 5/325 one-two tablets by mouth 3 times a day as needed.  CONDITION ON DISCHARGE:  [name] will be transferred to a skilled nursing home where she will do physical therapy and occupation therapy rehabilitation.  The patient will then follow up with Dr. Tillman Abide at the time of the discharge.  For complete hospital course including  procedures, consultations, recommendations and followup, refer to the previously dictated discharge summary done by Dr. Jerral Ralph.  This addendum addresses the fact that [name] had to wait 1 more day for skilled nursing home placement, after that, she remained stable. She was evaluated 1 more time by Physical Therapy and felt that she still has ongoing needs for skilled nursing home placement.  Today, May 10, 2011, the patient has been found to be stable for discharge.     Lonia Blood, M.D.     SL/MEDQ  D:  05/10/2011  T:  05/10/2011  Job:  [id number]  cc:   Karie Schwalbe, MD  Electronically Signed by Lonia Blood M.D. on 05/11/2011 04:53:02 PM

## 2011-06-09 ENCOUNTER — Encounter: Payer: Self-pay | Admitting: Internal Medicine

## 2011-06-28 DIAGNOSIS — M6281 Muscle weakness (generalized): Secondary | ICD-10-CM

## 2011-06-28 DIAGNOSIS — G8929 Other chronic pain: Secondary | ICD-10-CM

## 2011-06-28 DIAGNOSIS — I10 Essential (primary) hypertension: Secondary | ICD-10-CM

## 2011-06-28 DIAGNOSIS — I69998 Other sequelae following unspecified cerebrovascular disease: Secondary | ICD-10-CM

## 2011-06-28 DIAGNOSIS — H8110 Benign paroxysmal vertigo, unspecified ear: Secondary | ICD-10-CM

## 2011-07-16 DIAGNOSIS — H8110 Benign paroxysmal vertigo, unspecified ear: Secondary | ICD-10-CM

## 2011-07-16 DIAGNOSIS — I10 Essential (primary) hypertension: Secondary | ICD-10-CM

## 2011-07-16 DIAGNOSIS — M6281 Muscle weakness (generalized): Secondary | ICD-10-CM

## 2011-07-16 DIAGNOSIS — G8929 Other chronic pain: Secondary | ICD-10-CM

## 2011-07-16 DIAGNOSIS — I69998 Other sequelae following unspecified cerebrovascular disease: Secondary | ICD-10-CM

## 2011-07-30 ENCOUNTER — Ambulatory Visit: Payer: Medicare Other | Admitting: Internal Medicine

## 2011-08-13 ENCOUNTER — Encounter: Payer: Self-pay | Admitting: Internal Medicine

## 2011-08-13 ENCOUNTER — Ambulatory Visit (INDEPENDENT_AMBULATORY_CARE_PROVIDER_SITE_OTHER): Payer: Medicare Other | Admitting: Internal Medicine

## 2011-08-13 VITALS — BP 110/70 | HR 56 | Temp 98.1°F | Ht 66.0 in | Wt 101.0 lb

## 2011-08-13 DIAGNOSIS — I10 Essential (primary) hypertension: Secondary | ICD-10-CM | POA: Insufficient documentation

## 2011-08-13 DIAGNOSIS — I6350 Cerebral infarction due to unspecified occlusion or stenosis of unspecified cerebral artery: Secondary | ICD-10-CM

## 2011-08-13 DIAGNOSIS — F329 Major depressive disorder, single episode, unspecified: Secondary | ICD-10-CM

## 2011-08-13 DIAGNOSIS — B0229 Other postherpetic nervous system involvement: Secondary | ICD-10-CM

## 2011-08-13 DIAGNOSIS — K219 Gastro-esophageal reflux disease without esophagitis: Secondary | ICD-10-CM

## 2011-08-13 DIAGNOSIS — I639 Cerebral infarction, unspecified: Secondary | ICD-10-CM | POA: Insufficient documentation

## 2011-08-13 NOTE — Assessment & Plan Note (Signed)
BP Readings from Last 3 Encounters:  08/13/11 110/70  03/28/11 153/60  02/21/11 134/70   On the metoprolol Without the pain, may be able to consider going off the med

## 2011-08-13 NOTE — Assessment & Plan Note (Signed)
Seems to be close to her previous baseline On asa daily

## 2011-08-13 NOTE — Assessment & Plan Note (Signed)
Better now on topimax and cymbalta---may be causing her dry mouth On much less narcotic now also

## 2011-08-13 NOTE — Assessment & Plan Note (Signed)
Doing okay on protonix now

## 2011-08-13 NOTE — Assessment & Plan Note (Signed)
Mood is better with the pain control improved

## 2011-08-13 NOTE — Progress Notes (Signed)
Subjective:    Patient ID: [name], female    DOB: February 25, 1928, 78 y.o.   MRN: [id number]  HPI Here with daughter  Larey Seat and diagnosed with CVA at end of October Hospitalized at [facility], then rehab at [facility] since December 2nd Had home care nurses and PT for a few weeks also  Lives alone Able to shower, dress, take meds Daughter brings prepared foods  Speech i sbetter Occ problems swallowing--mostly due to dry mouth. Some trouble with her teeth also--had 1 pulled. Is going back soon  Is on much less narcotic May have been related to fall---though did have stroke Post herpetic neuralgia is much better on cymbalta and topimax (I took both these off her allergy list since she is now on both of them) Using MS contin 15mg  tid  now  PPI changed to protonix This seems to be controlling her heartburn  Still on iron tid May not need this   Current Outpatient Prescriptions on File Prior to Visit  Medication Sig Dispense Refill  . acyclovir (ZOVIRAX) 800 MG tablet Take 1 tablet (800 mg total) by mouth 4 (four) times daily.  120 tablet  11  . aspirin 81 MG tablet Take 81 mg by mouth daily.        [name] LORazepam (ATIVAN) 1 MG tablet Take 1-2 tablets by mouth at bedtime to help sleep.  60 tablet  3  . morphine (MS CONTIN) 60 MG 12 hr tablet 1 tab by mouth two times a day about 9AM and 9PM for chronic pain  60 tablet  0    Allergies  Allergen Reactions  . Omeprazole     REACTION: Didn't work  . Pregabalin     REACTION: SOB, swollen lips    Past Medical History  Diagnosis Date  . Breast cancer     Bilateral mastectomies 1986.  [name] GERD (gastroesophageal reflux disease)   . Hyperlipidemia   . Arthritis   . Depression   . Zoster      with Post-herpetic neuralgia  . History of partial thyroidectomy   . Dyspnea     Echo (5/11) was a difficult study due to breast implants but showed normal LV and RV size and systolic function.      [name] Atypical chest pain     Lexiscan myoview  (5/11) with EF 84%, normal wall motion, small fixed apical  perfusion defect likely due to breast attenuation, no evidence for ischemia or infarction.  **Patient had an  . CVA (cerebral infarction) 10/12    right lacunar    Past Surgical History  Procedure Date  . Abdominal hysterectomy   . Mastectomy 1980    bilateral  . Vaginal delivery     No family history on file.  History   Social History  . Marital Status: Widowed    Spouse Name: N/A    Number of Children: 2  . Years of Education: N/A   Occupational History  . retired Public librarian    Social History Main Topics  . Smoking status: Never Smoker   . Smokeless tobacco: Never Used  . Alcohol Use: No  . Drug Use: No  . Sexually Active: Not on file   Other Topics Concern  . Not on file   Social History Narrative  . No narrative on file   Review of Systems Appetite still not that good--but has improved Has lost some weight through the hospital and rehab process    Objective:   Physical  Exam  Constitutional: She is oriented to person, place, and time. She appears well-developed. No distress.  HENT:       No oral lesions  Neck: Normal range of motion. Neck supple.  Cardiovascular: Normal rate, regular rhythm and normal heart sounds.  Exam reveals no gallop.   No murmur heard. Pulmonary/Chest: Effort normal and breath sounds normal. No respiratory distress. She has no wheezes. She has no rales.  Abdominal: Soft. There is no tenderness.  Lymphadenopathy:    She has no cervical adenopathy.  Neurological: She is alert and oriented to person, place, and time.       Speech is fine  Psychiatric: She has a normal mood and affect. Her behavior is normal.          Assessment & Plan:

## 2011-08-14 LAB — CBC WITH DIFFERENTIAL/PLATELET
Basophils Absolute: 0 10*3/uL (ref 0.0–0.1)
Eosinophils Absolute: 0.2 10*3/uL (ref 0.0–0.7)
HCT: 39 % (ref 36.0–46.0)
Lymphs Abs: 2.3 10*3/uL (ref 0.7–4.0)
MCHC: 33.7 g/dL (ref 30.0–36.0)
Monocytes Relative: 9.2 % (ref 3.0–12.0)
Platelets: 122 10*3/uL — ABNORMAL LOW (ref 150.0–400.0)
RDW: 17.8 % — ABNORMAL HIGH (ref 11.5–14.6)

## 2011-08-14 LAB — BASIC METABOLIC PANEL
BUN: 29 mg/dL — ABNORMAL HIGH (ref 6–23)
CO2: 25 mEq/L (ref 19–32)
Calcium: 8.9 mg/dL (ref 8.4–10.5)
GFR: 61.34 mL/min (ref >60.00)
Glucose, Bld: 106 mg/dL — ABNORMAL HIGH (ref 70–99)

## 2011-09-10 ENCOUNTER — Ambulatory Visit (INDEPENDENT_AMBULATORY_CARE_PROVIDER_SITE_OTHER): Payer: Medicare Other | Admitting: Internal Medicine

## 2011-09-10 ENCOUNTER — Encounter: Payer: Self-pay | Admitting: Internal Medicine

## 2011-09-10 DIAGNOSIS — B0229 Other postherpetic nervous system involvement: Secondary | ICD-10-CM

## 2011-09-10 DIAGNOSIS — F329 Major depressive disorder, single episode, unspecified: Secondary | ICD-10-CM

## 2011-09-10 DIAGNOSIS — I639 Cerebral infarction, unspecified: Secondary | ICD-10-CM

## 2011-09-10 DIAGNOSIS — I6350 Cerebral infarction due to unspecified occlusion or stenosis of unspecified cerebral artery: Secondary | ICD-10-CM

## 2011-09-10 DIAGNOSIS — F3289 Other specified depressive episodes: Secondary | ICD-10-CM

## 2011-09-10 DIAGNOSIS — B029 Zoster without complications: Secondary | ICD-10-CM

## 2011-09-10 DIAGNOSIS — I10 Essential (primary) hypertension: Secondary | ICD-10-CM

## 2011-09-10 NOTE — Assessment & Plan Note (Signed)
Mood is better back at home cymbalta may be helping this as well as PHN pain

## 2011-09-10 NOTE — Assessment & Plan Note (Signed)
Better control on cymbalta and topirimate Some dizziness---?from meds Some headache---tylenol does help

## 2011-09-10 NOTE — Progress Notes (Signed)
Subjective:    Patient ID: [name], female    DOB: [birth year], 78 y.o.   MRN: [id number]  HPI Doing better   Still living alone Daughter brings her food Independent with ADLs   Has pain in right ear  Notes running of eye medicine Mouth is dry---drinks water constantly   Still having right frontal pain No active sores now  No chest pain No SOB Speech is much better  Current Outpatient Prescriptions on File Prior to Visit  Medication Sig Dispense Refill  . acyclovir (ZOVIRAX) 800 MG tablet Take 1 tablet (800 mg total) by mouth 4 (four) times daily.  120 tablet  11  . aspirin 81 MG tablet Take 81 mg by mouth daily.        . DULoxetine (CYMBALTA) 30 MG capsule Take 30 mg by mouth daily.      [name] LORazepam (ATIVAN) 1 MG tablet Take 1-2 tablets by mouth at bedtime to help sleep.  60 tablet  3  . metoprolol succinate (TOPROL-XL) 25 MG 24 hr tablet Take 25 mg by mouth daily.      [name] morphine (MS CONTIN) 15 MG 12 hr tablet Take 15 mg by mouth 3 (three) times daily.      . pantoprazole (PROTONIX) 40 MG tablet Take 40 mg by mouth daily.      . simvastatin (ZOCOR) 20 MG tablet Take 20 mg by mouth at bedtime.      . topiramate (TOPAMAX) 100 MG tablet Take 100 mg by mouth daily.        Allergies  Allergen Reactions  . Omeprazole     REACTION: Didn't work  . Pregabalin     REACTION: SOB, swollen lips    Past Medical History  Diagnosis Date  . Breast cancer     Bilateral mastectomies 1986.  [name] GERD (gastroesophageal reflux disease)   . Hyperlipidemia   . Arthritis   . Depression   . Zoster      with Post-herpetic neuralgia  . History of partial thyroidectomy   . Dyspnea     Echo (5/11) was a difficult study due to breast implants but showed normal LV and RV size and systolic function.      [name] Atypical chest pain     Lexiscan myoview (5/11) with EF 84%, normal wall motion, small fixed apical  perfusion defect likely due to breast attenuation, no evidence for ischemia or  infarction.  **Patient had an  . CVA (cerebral infarction) 10/12    right lacunar  . Hypertension     Past Surgical History  Procedure Date  . Abdominal hysterectomy   . Mastectomy 1980    bilateral  . Vaginal delivery     No family history on file.  History   Social History  . Marital Status: Widowed    Spouse Name: N/A    Number of Children: 2  . Years of Education: N/A   Occupational History  . retired Public librarian    Social History Main Topics  . Smoking status: Never Smoker   . Smokeless tobacco: Never Used  . Alcohol Use: No  . Drug Use: No  . Sexually Active: Not on file   Other Topics Concern  . Not on file   Social History Narrative  . No narrative on file   Review of Systems Restless at night---up once to void Does feel reasonable rested but dizzy and with neuralgia pain    Objective:   Physical Exam  Constitutional:  She appears well-developed and well-nourished. No distress.  Neck: Normal range of motion. Neck supple.  Cardiovascular: Normal rate, regular rhythm and normal heart sounds.  Exam reveals no gallop.   No murmur heard. Pulmonary/Chest: Effort normal and breath sounds normal. No respiratory distress. She has no wheezes. She has no rales.  Musculoskeletal: She exhibits no edema and no tenderness.  Lymphadenopathy:    She has no cervical adenopathy.  Neurological:       Sensitive in right V1 distribution  Skin:       No active lesions  Psychiatric: She has a normal mood and affect. Her behavior is normal.          Assessment & Plan:

## 2011-09-10 NOTE — Assessment & Plan Note (Addendum)
Mild deficits mostly language ASA and statin

## 2011-09-10 NOTE — Assessment & Plan Note (Signed)
Finally seems quiet despite the continuation of PHN Could consider zostavax if she stays quiet

## 2011-09-10 NOTE — Assessment & Plan Note (Signed)
BP Readings from Last 3 Encounters:  09/10/11 116/68  08/13/11 110/70  03/28/11 153/60   Good control No changes

## 2011-09-19 ENCOUNTER — Other Ambulatory Visit: Payer: Self-pay | Admitting: *Deleted

## 2011-09-19 MED ORDER — METOPROLOL SUCCINATE ER 25 MG PO TB24: 25.0000 mg | ORAL_TABLET | Freq: Every day | ORAL | Status: DC

## 2011-09-19 MED ORDER — PANTOPRAZOLE SODIUM 40 MG PO TBEC: 40.0000 mg | DELAYED_RELEASE_TABLET | Freq: Every day | ORAL | Status: DC

## 2011-09-24 ENCOUNTER — Other Ambulatory Visit: Payer: Self-pay | Admitting: *Deleted

## 2011-09-24 MED ORDER — SIMVASTATIN 20 MG PO TABS: 20.0000 mg | ORAL_TABLET | Freq: Every day | ORAL | Status: DC

## 2011-09-24 MED ORDER — LORAZEPAM 1 MG PO TABS: ORAL_TABLET | ORAL | Status: DC

## 2011-09-24 NOTE — Telephone Encounter (Signed)
Okay lorazepam #60 x 1 Okay to refill simvastatin for 1 year---started in hospital

## 2011-09-24 NOTE — Telephone Encounter (Signed)
Ok to fill simvastatin? Pt's last lipid panel was good, and we've never refilled med before.

## 2011-09-24 NOTE — Telephone Encounter (Signed)
rx called into pharmacy rx sent to pharmacy by e-script  

## 2011-10-01 ENCOUNTER — Other Ambulatory Visit: Payer: Self-pay

## 2011-10-01 MED ORDER — DULOXETINE HCL 30 MG PO CPEP: 30.0000 mg | ORAL_CAPSULE | Freq: Every day | ORAL | Status: DC

## 2011-10-01 NOTE — Telephone Encounter (Signed)
[place] pharmacy request refill Cymbalta 30 mg #30 x 11. Pt last seen 09/10/11.

## 2011-10-04 ENCOUNTER — Telehealth: Payer: Self-pay | Admitting: *Deleted

## 2011-10-04 NOTE — Telephone Encounter (Signed)
Might want to ask the eye doctor---maybe she can take it while still up  They can try some half strength peroxide in the ear to see if that will loosen the sticky stuff

## 2011-10-04 NOTE — Telephone Encounter (Signed)
Daughter calling asking for advice on what to do with her mother's ear/ eye problem, pt states she uses a eye drop and eye ointment for shingles in her eye and when she wakes in the morning the ointment and eye drop has drained into her ear and it's very sticky and hard to clean off, not pt is complaining of ear pain on the opposite side? Daughter wants to know how to stop it from draining? Please advise

## 2011-10-04 NOTE — Telephone Encounter (Signed)
Spoke with daughter and advised results. She will call the optometrist

## 2011-10-08 ENCOUNTER — Other Ambulatory Visit: Payer: Self-pay

## 2011-10-08 MED ORDER — MORPHINE SULFATE CR 15 MG PO TB12: 15.0000 mg | ORAL_TABLET | Freq: Three times a day (TID) | ORAL | Status: DC

## 2011-10-08 NOTE — Telephone Encounter (Signed)
Spoke with patient and advised rx ready for pick-up and it will be at the front desk.  

## 2011-10-08 NOTE — Telephone Encounter (Signed)
Written rx for Morphine 15 mg requested. Pt is presently out of medication and would like to pick up rx today. When rx is ready for pick up please call [phone] or [phone].

## 2011-11-05 ENCOUNTER — Other Ambulatory Visit: Payer: Self-pay

## 2011-11-05 MED ORDER — MORPHINE SULFATE CR 15 MG PO TB12: 15.0000 mg | ORAL_TABLET | Freq: Three times a day (TID) | ORAL | Status: DC

## 2011-11-05 NOTE — Telephone Encounter (Signed)
Left message on machine for daughter that rx is ready for pick-up, and it will be at our front desk.

## 2011-11-05 NOTE — Telephone Encounter (Signed)
[name],pts daughter request written rx Morphine sulfate ER 15 mg. Pt last seen 09/10/11. [name] can be reached at [phone] when rx ready for pick up. Pt is out of med.

## 2011-12-04 ENCOUNTER — Other Ambulatory Visit: Payer: Self-pay

## 2011-12-04 MED ORDER — MORPHINE SULFATE ER 15 MG PO TBCR: 15.0000 mg | EXTENDED_RELEASE_TABLET | Freq: Two times a day (BID) | ORAL | Status: DC

## 2011-12-04 NOTE — Telephone Encounter (Signed)
Spoke with patient and advised rx ready for pick-up and it will be at the front desk.  

## 2011-12-04 NOTE — Telephone Encounter (Signed)
[name] request rx Morphine. Call for pick up.

## 2011-12-10 ENCOUNTER — Ambulatory Visit (INDEPENDENT_AMBULATORY_CARE_PROVIDER_SITE_OTHER): Payer: Medicare Other | Admitting: Internal Medicine

## 2011-12-10 ENCOUNTER — Encounter: Payer: Self-pay | Admitting: Internal Medicine

## 2011-12-10 VITALS — BP 128/80 | HR 60 | Temp 98.5°F | Ht 66.0 in | Wt 100.0 lb

## 2011-12-10 DIAGNOSIS — B0229 Other postherpetic nervous system involvement: Secondary | ICD-10-CM

## 2011-12-10 DIAGNOSIS — I6990 Unspecified sequelae of unspecified cerebrovascular disease: Secondary | ICD-10-CM

## 2011-12-10 DIAGNOSIS — F329 Major depressive disorder, single episode, unspecified: Secondary | ICD-10-CM

## 2011-12-10 DIAGNOSIS — I10 Essential (primary) hypertension: Secondary | ICD-10-CM

## 2011-12-10 NOTE — Assessment & Plan Note (Signed)
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

## 2011-12-10 NOTE — Patient Instructions (Signed)
Please take the cymbalta every other day for the next 2 weeks. If your dry mouth is better, and you don't get a lot more depressed, then stop it. If your head pain from the shingles then is worse, we will double the topiramate to 100mg  twice a day

## 2011-12-10 NOTE — Assessment & Plan Note (Signed)
Seems to mostly be secondary to the pain cymbalta probably causing bad dry mouth that is biggest complaint  Will try weaning off

## 2011-12-10 NOTE — Assessment & Plan Note (Signed)
Mostly right hand problems which are mild Some speech issues that are also mild On asa and statin

## 2011-12-10 NOTE — Progress Notes (Signed)
Subjective:    Patient ID: [name], female    DOB: 1927-08-10, 78 y.o.   MRN: [id number]  HPI Here with daughter Doing okay  Still having the right head pain Shingles are active again---gets the itching and break out of shingles again  Ongoing pain Stable status on morphine and the cymbalta and topiramate Tylenol gives some additional relief  Stroke symptoms mostly better Still gets some dizziness Talks low that family has trouble understanding her but not clearly aphasic Some trouble still with right hand---can't tie shoes properly Wobbles a little when walking  No overt depression Feels helpless in that she wants to do stuff and can't  Walks with friend daily Daughter gets her food, sets up meds and transports her to appts Independent at home Walks to senior center and is involved in activities Not anhedonic  Current Outpatient Prescriptions on File Prior to Visit  Medication Sig Dispense Refill  . acyclovir (ZOVIRAX) 800 MG tablet Take 1 tablet (800 mg total) by mouth 4 (four) times daily.  120 tablet  11  . aspirin 81 MG tablet Take 81 mg by mouth daily.        . DULoxetine (CYMBALTA) 30 MG capsule Take 1 capsule (30 mg total) by mouth daily.  30 capsule  11  . LORazepam (ATIVAN) 1 MG tablet Take 1-2 tablets by mouth at bedtime to help sleep.  60 tablet  1  . metoprolol succinate (TOPROL-XL) 25 MG 24 hr tablet Take 1 tablet (25 mg total) by mouth daily.  30 tablet  11  . morphine (MS CONTIN) 15 MG 12 hr tablet Take 1 tablet (15 mg total) by mouth 2 (two) times daily.  60 tablet  0  . pantoprazole (PROTONIX) 40 MG tablet Take 1 tablet (40 mg total) by mouth daily.  30 tablet  11  . simvastatin (ZOCOR) 20 MG tablet Take 1 tablet (20 mg total) by mouth at bedtime.  30 tablet  11  . topiramate (TOPAMAX) 100 MG tablet Take 100 mg by mouth daily.        Allergies  Allergen Reactions  . Omeprazole     REACTION: Didn't work  . Pregabalin     REACTION: SOB, swollen  lips    Past Medical History  Diagnosis Date  . Breast cancer     Bilateral mastectomies 1986.  [name] GERD (gastroesophageal reflux disease)   . Hyperlipidemia   . Arthritis   . Depression   . Zoster      with Post-herpetic neuralgia  . History of partial thyroidectomy   . Dyspnea     Echo (5/11) was a difficult study due to breast implants but showed normal LV and RV size and systolic function.      [name] Atypical chest pain     Lexiscan myoview (5/11) with EF 84%, normal wall motion, small fixed apical  perfusion defect likely due to breast attenuation, no evidence for ischemia or infarction.  **Patient had an  . CVA (cerebral infarction) 10/12    right lacunar  . Hypertension     Past Surgical History  Procedure Date  . Abdominal hysterectomy   . Mastectomy 1980    bilateral  . Vaginal delivery     No family history on file.  History   Social History  . Marital Status: Widowed    Spouse Name: N/A    Number of Children: 2  . Years of Education: N/A   Occupational History  . retired Public librarian  Social History Main Topics  . Smoking status: Never Smoker   . Smokeless tobacco: Never Used  . Alcohol Use: No  . Drug Use: No  . Sexually Active: Not on file   Other Topics Concern  . Not on file   Social History Narrative  . No narrative on file   Review of Systems Mouth stays dry --has to drink a lot then up to void at night Food doesn't taste right    Objective:   Physical Exam  Constitutional: She appears well-developed and well-nourished. No distress.  Neck: Normal range of motion.  Cardiovascular: Normal rate, regular rhythm and normal heart sounds.  Exam reveals no gallop.   No murmur heard. Pulmonary/Chest: Effort normal and breath sounds normal. No respiratory distress. She has no wheezes. She has no rales.  Musculoskeletal: She exhibits no edema and no tenderness.  Lymphadenopathy:    She has no cervical adenopathy.  Psychiatric: Her behavior is  normal. Thought content normal.       Frustrated by pain and itching but no overt depression          Assessment & Plan:

## 2011-12-10 NOTE — Assessment & Plan Note (Signed)
BP Readings from Last 3 Encounters:  12/10/11 128/80  09/10/11 116/68  08/13/11 110/70   Adequate control No changes needed

## 2011-12-24 ENCOUNTER — Telehealth: Payer: Self-pay | Admitting: *Deleted

## 2011-12-24 ENCOUNTER — Other Ambulatory Visit: Payer: Self-pay | Admitting: *Deleted

## 2011-12-24 MED ORDER — TOPIRAMATE 100 MG PO TABS: 100.0000 mg | ORAL_TABLET | Freq: Every day | ORAL | Status: DC

## 2011-12-24 MED ORDER — TOPIRAMATE 100 MG PO TABS: 200.0000 mg | ORAL_TABLET | Freq: Every day | ORAL | Status: DC

## 2011-12-24 NOTE — Telephone Encounter (Signed)
rx sent to pharmacy by e-script  

## 2011-12-24 NOTE — Telephone Encounter (Signed)
OK to refill? Last OV 12/10/11.

## 2011-12-24 NOTE — Telephone Encounter (Signed)
Okay to refill for 1 year. 

## 2011-12-24 NOTE — Telephone Encounter (Signed)
If she is taking 200mg  daily---okay to continue Update the prescription and med list though

## 2011-12-24 NOTE — Telephone Encounter (Signed)
Confirming Topamax instructions. Sent in 1 QD. Patient says she takes 2 QD. Please advise.

## 2012-01-04 ENCOUNTER — Other Ambulatory Visit: Payer: Self-pay

## 2012-01-04 MED ORDER — MORPHINE SULFATE ER 15 MG PO TBCR
15.0000 mg | EXTENDED_RELEASE_TABLET | Freq: Two times a day (BID) | ORAL | Status: DC
Start: 2012-01-04 — End: 2012-02-01

## 2012-01-04 NOTE — Telephone Encounter (Signed)
Spoke with daughter and advised results.  

## 2012-01-04 NOTE — Telephone Encounter (Signed)
[facility] Bent request rx Morphine. Call when ready for pick up.

## 2012-01-22 ENCOUNTER — Other Ambulatory Visit: Payer: Self-pay | Admitting: *Deleted

## 2012-01-22 MED ORDER — LORAZEPAM 1 MG PO TABS: ORAL_TABLET | ORAL | Status: DC

## 2012-01-22 NOTE — Telephone Encounter (Signed)
rx called into pharmacy

## 2012-01-22 NOTE — Telephone Encounter (Signed)
Okay #60 x 1 

## 2012-02-01 ENCOUNTER — Other Ambulatory Visit: Payer: Self-pay | Admitting: Internal Medicine

## 2012-02-01 NOTE — Telephone Encounter (Signed)
Spoke with patient and she can wait until Monday for refill she will have a ride then, or have her daughter pick it up.

## 2012-02-01 NOTE — Telephone Encounter (Signed)
Please call patient's daughter, [name], back for refill request.

## 2012-02-04 MED ORDER — MORPHINE SULFATE ER 15 MG PO TBCR: 15.0000 mg | EXTENDED_RELEASE_TABLET | Freq: Two times a day (BID) | ORAL | Status: DC

## 2012-02-04 NOTE — Telephone Encounter (Signed)
Spoke with patient and advised results, pt came in and I gave it to her in person.

## 2012-02-11 ENCOUNTER — Telehealth: Payer: Self-pay

## 2012-02-11 ENCOUNTER — Telehealth: Payer: Self-pay | Admitting: Internal Medicine

## 2012-02-11 ENCOUNTER — Encounter: Payer: Self-pay | Admitting: Family Medicine

## 2012-02-11 ENCOUNTER — Ambulatory Visit (INDEPENDENT_AMBULATORY_CARE_PROVIDER_SITE_OTHER): Payer: Medicare Other | Admitting: Family Medicine

## 2012-02-11 VITALS — BP 118/72 | HR 56 | Temp 98.0°F | Wt 99.0 lb

## 2012-02-11 DIAGNOSIS — R42 Dizziness and giddiness: Secondary | ICD-10-CM

## 2012-02-11 MED ORDER — METOPROLOL SUCCINATE ER 25 MG PO TB24: 12.5000 mg | ORAL_TABLET | Freq: Every day | ORAL | Status: DC

## 2012-02-11 MED ORDER — TOPIRAMATE 100 MG PO TABS: 100.0000 mg | ORAL_TABLET | Freq: Every day | ORAL | Status: DC

## 2012-02-11 NOTE — Telephone Encounter (Signed)
pts daughter Elita Quick had left v/m earlier that pt complained with dizziness and h/a and also not eating well. Left v/m at 9:29 am for pam to call back. Just now tried again to reach pt or pam and saw where CAN made appt with Dr Reece Agar today at 4:15pm.

## 2012-02-11 NOTE — Telephone Encounter (Signed)
Tried to contact [name] or pt and no answer and I saw on separate note where CAN already scheduled appt with Dr g today at 4:15.

## 2012-02-11 NOTE — Progress Notes (Signed)
  Subjective:    Patient ID: [name], female    DOB: May 13, 1928, 78 y.o.   MRN: [id number]  HPI CC: appetite loss, check ears  Presents with daughter in law.  "I stay dizzy all the time".  Fatigued, tired.  Every morning when takes medicines, feels this happens more.  Dizziness described as sense of off balance and orthostatic lightheadedness.  Ears stay sore.  This has been going on for 1-2 months.  Having some frontal headaches as well.  Some word finding difficulty.  On metoprolol XL 25mg  - for blood pressure. On topamax bid and cymbalta as well as morphine for post herpetic neuralgia.  H/o shingles in eye/optic nerve.  Takes acyclovir for this.  Taking cymbalta every 2 days.  More trouble if tries to space out more than this. On lorazepam to help her rest at night.  Appetite down.  Mild noted weight loss.  Actually 2 lb weight gain in last 2 weeks.  Last year had fall, told had some BPPV issues.  Had fall with head injury 04/2011, s/p hospitalization at [facility] found to have R lacunar CVA and has since completed rehab at [facility].  BP Readings from Last 3 Encounters:  02/11/12 118/72  12/10/11 128/80  09/10/11 116/68    Wt Readings from Last 3 Encounters:  02/11/12 99 lb (44.906 kg)  12/10/11 100 lb (45.36 kg)  09/10/11 102 lb (46.267 kg)   Lab Results  Component Value Date   TSH 0.365 05/03/2011   Review of Systems No fevers recently, new rashes, chest pain or tightness, SOB, coughing, dysuria, urgency, frequency    Objective:   Physical Exam  Nursing note and vitals reviewed. Constitutional: She is oriented to person, place, and time. She appears well-developed and well-nourished. No distress.  HENT:  Head: Normocephalic and atraumatic.  Mouth/Throat: Oropharynx is clear and moist. No oropharyngeal exudate.  Eyes: Conjunctivae and EOM are normal. Pupils are equal, round, and reactive to light. No scleral icterus.  Neck: Normal range of motion. Neck supple.    Cardiovascular: Normal rate, regular rhythm, normal heart sounds and intact distal pulses.   No murmur heard. Pulmonary/Chest: Effort normal and breath sounds normal. No respiratory distress. She has no wheezes. She has no rales.  Musculoskeletal: She exhibits no edema.  Lymphadenopathy:    She has no cervical adenopathy.  Neurological: She is alert and oriented to person, place, and time. No cranial nerve deficit.       CN 2-12 intact  Skin: Skin is warm and dry. No rash noted.  Psychiatric: She has a normal mood and affect.       Calm, full affect       Assessment & Plan:

## 2012-02-11 NOTE — Telephone Encounter (Signed)
Caller: [name]/Child; PCP: Tillman Abide; CB#: [phone]; ; ; Call regarding Decrease in Appetite With Weight Loss; Caller reports that patient says she does not have an appetite. Onset approximately 3-4 months ago. Caller reports patient has been drinking Ensure once a day and taking Senior Multivitamin. Daughter reports that atleast half of all the patient's meals are left uneaten. Emergent symptom of "Observed to be leaving at least one fourth of their food uneaten at 2 of their 3 daily meals for 7 days or more" positive per Weight Loss, Unintentional guideline. Appointment scheduled 02/11/12 at 4:15pm with Dr. Sharen Hones.

## 2012-02-11 NOTE — Telephone Encounter (Signed)
[name] pts daughter left v/m;h/a,dizziness, not eating and issues with meds. Left v/m for [name] to call back.

## 2012-02-11 NOTE — Assessment & Plan Note (Signed)
Sounds multifactorial lightheadedness/imbalance but will try and minimize iatrogenic causes. As endorsing word finding difficulty, fatigue and appetite change, decrease topamax to 100mg  qhs. As endorsing orthostatic dizziness, cut toprol xl to 12.5mg  daily as bradycardic today and doubt BP needs this. Continue cymbalta for now at QOD dosing. Discussed minimizing lorazepam qhs for sleep as already taking topamax at bedtime which should cause some sedation. F/u with PCP.  Pt/daughter in law agree with plan.

## 2012-02-11 NOTE — Patient Instructions (Addendum)
Decrease topamax to one 100mg  pill at bedtime (instead of twice daily) Decrease metoprolol xl to 12.5mg  in the morning (1/2 table tdaily). Continue cymbalta at 30mg  every other day. Try to back off lorazepam as you may not need this to sleep as you're already taking topamax at night. Return to see Dr. Alphonsus Sias in 6 weeks

## 2012-02-11 NOTE — Telephone Encounter (Signed)
Will see this afternoon 

## 2012-02-29 ENCOUNTER — Other Ambulatory Visit: Payer: Self-pay

## 2012-02-29 MED ORDER — MORPHINE SULFATE ER 15 MG PO TBCR: 15.0000 mg | EXTENDED_RELEASE_TABLET | Freq: Two times a day (BID) | ORAL | Status: DC

## 2012-02-29 NOTE — Telephone Encounter (Signed)
Spoke with patient and advised rx ready for pick-up and it will be at the front desk.  

## 2012-02-29 NOTE — Telephone Encounter (Signed)
[name] left v/m requesting rx Morphine. Wants to pick up rx today..Please advise.

## 2012-03-24 ENCOUNTER — Ambulatory Visit (INDEPENDENT_AMBULATORY_CARE_PROVIDER_SITE_OTHER): Payer: Medicare Other | Admitting: Internal Medicine

## 2012-03-24 ENCOUNTER — Encounter: Payer: Self-pay | Admitting: Internal Medicine

## 2012-03-24 VITALS — BP 128/70 | HR 62 | Temp 98.4°F | Ht 66.0 in | Wt 97.0 lb

## 2012-03-24 DIAGNOSIS — R42 Dizziness and giddiness: Secondary | ICD-10-CM

## 2012-03-24 DIAGNOSIS — F3289 Other specified depressive episodes: Secondary | ICD-10-CM

## 2012-03-24 DIAGNOSIS — E785 Hyperlipidemia, unspecified: Secondary | ICD-10-CM

## 2012-03-24 DIAGNOSIS — F329 Major depressive disorder, single episode, unspecified: Secondary | ICD-10-CM

## 2012-03-24 DIAGNOSIS — B0229 Other postherpetic nervous system involvement: Secondary | ICD-10-CM

## 2012-03-24 DIAGNOSIS — Z23 Encounter for immunization: Secondary | ICD-10-CM

## 2012-03-24 DIAGNOSIS — E46 Unspecified protein-calorie malnutrition: Secondary | ICD-10-CM | POA: Insufficient documentation

## 2012-03-24 DIAGNOSIS — R636 Underweight: Secondary | ICD-10-CM

## 2012-03-24 DIAGNOSIS — I10 Essential (primary) hypertension: Secondary | ICD-10-CM

## 2012-03-24 LAB — HEPATIC FUNCTION PANEL
ALT: 15 U/L (ref 0–35)
AST: 19 U/L (ref 0–37)
Albumin: 4.1 g/dL (ref 3.5–5.2)
Alkaline Phosphatase: 48 U/L (ref 39–117)
Bilirubin, Direct: 0 mg/dL (ref 0.0–0.3)
Total Protein: 7.1 g/dL (ref 6.0–8.3)

## 2012-03-24 LAB — LIPID PANEL
LDL Cholesterol: 55 mg/dL (ref 0–99)
VLDL: 23.8 mg/dL (ref 0.0–40.0)

## 2012-03-24 LAB — BASIC METABOLIC PANEL
CO2: 27 mEq/L (ref 19–32)
Calcium: 9.2 mg/dL (ref 8.4–10.5)
GFR: 56.32 mL/min — ABNORMAL LOW (ref >60.00)
Glucose, Bld: 110 mg/dL — ABNORMAL HIGH (ref 70–99)
Potassium: 4.5 mEq/L (ref 3.5–5.1)
Sodium: 141 mEq/L (ref 135–145)

## 2012-03-24 LAB — CBC WITH DIFFERENTIAL/PLATELET
Basophils Relative: 0.3 % (ref 0.0–3.0)
HCT: 39.3 % (ref 36.0–46.0)
Hemoglobin: 12.7 g/dL (ref 12.0–15.0)
Lymphocytes Relative: 24.1 % (ref 12.0–46.0)
Lymphs Abs: 1.6 10*3/uL (ref 0.7–4.0)
Monocytes Relative: 7 % (ref 3.0–12.0)
Neutro Abs: 4.5 10*3/uL (ref 1.4–7.7)
RBC: 3.8 Mil/uL — ABNORMAL LOW (ref 3.87–5.11)
RDW: 12.5 % (ref 11.5–14.6)

## 2012-03-24 NOTE — Assessment & Plan Note (Signed)
BP Readings from Last 3 Encounters:  03/24/12 128/70  02/11/12 118/72  12/10/11 128/80   Good control despite lowered med No changes  Due for labs

## 2012-03-24 NOTE — Progress Notes (Signed)
Subjective:    Patient ID: [name], female    DOB: Sep 15, 1927, 78 y.o.   MRN: [id number]  HPI Dizziness had been worse last month Did cut the topamax to just once at bedtime (half the former dose) Cut back on the metoprolol also She feels her dizziness is some better  Decreased lorazepam to 1/2 tab at bedtime Still able to sleep okay  Recent visit with Dr Fransico Michael He feels the acyclovir won't help the eye any further and suggested decreasing to once a day Still has active lesions in scalp though  Has sense of drainage down the back of her throat Affects her swallowing Does have dry mouth though  Wants to go back to daily on the cymbalta Started it daily a while ago and it works better on headache at daily dose Seems to help her post herpetic neuralgia  Current Outpatient Prescriptions on File Prior to Visit  Medication Sig Dispense Refill  . acyclovir (ZOVIRAX) 800 MG tablet Take 1 tablet (800 mg total) by mouth 4 (four) times daily.  120 tablet  11  . aspirin 81 MG tablet Take 81 mg by mouth daily.        . DULoxetine (CYMBALTA) 30 MG capsule Take 30 mg by mouth daily.       . metoprolol succinate (TOPROL-XL) 25 MG 24 hr tablet Take 0.5 tablets (12.5 mg total) by mouth daily.      [name] morphine (MS CONTIN) 15 MG 12 hr tablet Take 1 tablet (15 mg total) by mouth 2 (two) times daily.  60 tablet  0  . Multiple Vitamin (MULTIVITAMIN) tablet Take 1 tablet by mouth daily.      . pantoprazole (PROTONIX) 40 MG tablet Take 1 tablet (40 mg total) by mouth daily.  30 tablet  11  . simvastatin (ZOCOR) 20 MG tablet Take 1 tablet (20 mg total) by mouth at bedtime.  30 tablet  11  . topiramate (TOPAMAX) 100 MG tablet Take 1 tablet (100 mg total) by mouth at bedtime.        Allergies  Allergen Reactions  . Omeprazole     REACTION: Didn't work  . Pregabalin     REACTION: SOB, swollen lips    Past Medical History  Diagnosis Date  . Breast cancer     Bilateral mastectomies 1986.  [name]  GERD (gastroesophageal reflux disease)   . Hyperlipidemia   . Arthritis   . Depression   . Zoster      with Post-herpetic neuralgia  . History of partial thyroidectomy   . Dyspnea     Echo (5/11) was a difficult study due to breast implants but showed normal LV and RV size and systolic function.      [name] Atypical chest pain     Lexiscan myoview (5/11) with EF 84%, normal wall motion, small fixed apical  perfusion defect likely due to breast attenuation, no evidence for ischemia or infarction.  **Patient had an  . CVA (cerebral infarction) 10/12    right lacunar  . Hypertension     Past Surgical History  Procedure Date  . Abdominal hysterectomy   . Mastectomy 1980    bilateral  . Vaginal delivery     No family history on file.  History   Social History  . Marital Status: Widowed    Spouse Name: N/A    Number of Children: 2  . Years of Education: N/A   Occupational History  . retired Public librarian  Social History Main Topics  . Smoking status: Never Smoker   . Smokeless tobacco: Never Used  . Alcohol Use: No  . Drug Use: No  . Sexually Active: Not on file   Other Topics Concern  . Not on file   Social History Narrative  . No narrative on file   Review of Systems Has lost 2# since last visit Sleeps okay despite decrease meds    Objective:   Physical Exam  Constitutional: She appears well-developed. No distress.  HENT:       No scalp lesions now  Neck: Normal range of motion. Neck supple. No thyromegaly present.  Cardiovascular: Normal rate, regular rhythm and normal heart sounds.  Exam reveals no gallop.   No murmur heard. Pulmonary/Chest: Effort normal and breath sounds normal. No respiratory distress. She has no wheezes. She has no rales.  Abdominal: Soft. There is no tenderness.  Musculoskeletal: She exhibits no edema and no tenderness.  Lymphadenopathy:    She has no cervical adenopathy.  Psychiatric: She has a normal mood and affect. Her behavior is  normal.          Assessment & Plan:

## 2012-03-24 NOTE — Assessment & Plan Note (Signed)
She will increase her ensure frequency

## 2012-03-24 NOTE — Assessment & Plan Note (Signed)
Ongoing  Better with daily cymbalta Can try decreasing acyclovir to tid---would increase back if new lesions in scalp

## 2012-03-24 NOTE — Patient Instructions (Signed)
Please decrease the acyclovir to three times daily. If you have more sores again, you can go back up to 4 times a day.

## 2012-03-24 NOTE — Assessment & Plan Note (Signed)
Due for labs Will continue due to recent stroke

## 2012-03-24 NOTE — Assessment & Plan Note (Signed)
Improved with lower doses of topamax and metoprolol No further changes

## 2012-03-24 NOTE — Assessment & Plan Note (Signed)
Ongoing mood issues with pain, etc On the cymbalta now

## 2012-03-25 ENCOUNTER — Encounter: Payer: Self-pay | Admitting: *Deleted

## 2012-04-01 ENCOUNTER — Other Ambulatory Visit: Payer: Self-pay | Admitting: *Deleted

## 2012-04-01 MED ORDER — ACYCLOVIR 800 MG PO TABS: 800.0000 mg | ORAL_TABLET | Freq: Four times a day (QID) | ORAL | Status: DC

## 2012-04-07 ENCOUNTER — Other Ambulatory Visit: Payer: Self-pay | Admitting: Internal Medicine

## 2012-04-07 NOTE — Telephone Encounter (Signed)
The patient called the triage line hoping to get a refill of Morphine 15mg 

## 2012-04-08 MED ORDER — MORPHINE SULFATE ER 15 MG PO TBCR: 15.0000 mg | EXTENDED_RELEASE_TABLET | Freq: Two times a day (BID) | ORAL | Status: DC

## 2012-04-08 NOTE — Telephone Encounter (Signed)
Spoke with patient and advised rx ready for pick-up and it will be at the front desk.  

## 2012-05-05 ENCOUNTER — Other Ambulatory Visit: Payer: Self-pay

## 2012-05-05 MED ORDER — MORPHINE SULFATE ER 15 MG PO TBCR: 15.0000 mg | EXTENDED_RELEASE_TABLET | Freq: Two times a day (BID) | ORAL | Status: DC

## 2012-05-05 NOTE — Telephone Encounter (Signed)
Spoke with patient and advised rx ready for pick-up and it will be at the front desk.  

## 2012-05-05 NOTE — Telephone Encounter (Signed)
Pt request rx morphine; call when ready for pickup.

## 2012-06-02 ENCOUNTER — Other Ambulatory Visit: Payer: Self-pay

## 2012-06-02 MED ORDER — MORPHINE SULFATE ER 15 MG PO TBCR: 15.0000 mg | EXTENDED_RELEASE_TABLET | Freq: Two times a day (BID) | ORAL | Status: DC

## 2012-06-02 NOTE — Telephone Encounter (Signed)
pts daughter left v/m requesting rx Morphine. Call when ready for pick up. pts daughter calling early due to Thanksgiving holiday.

## 2012-06-03 NOTE — Telephone Encounter (Signed)
Spoke with patient and advised rx ready for pick-up and it will be at the front desk.  

## 2012-06-23 ENCOUNTER — Ambulatory Visit: Payer: Medicare Other | Admitting: Internal Medicine

## 2012-06-23 ENCOUNTER — Other Ambulatory Visit: Payer: Self-pay | Admitting: *Deleted

## 2012-06-23 MED ORDER — LORAZEPAM 1 MG PO TABS: 0.5000 mg | ORAL_TABLET | Freq: Every day | ORAL | Status: DC

## 2012-06-23 NOTE — Telephone Encounter (Signed)
Okay #30 x 1 

## 2012-06-23 NOTE — Telephone Encounter (Signed)
rx called into pharmacy

## 2012-07-07 ENCOUNTER — Encounter: Payer: Self-pay | Admitting: Internal Medicine

## 2012-07-07 ENCOUNTER — Ambulatory Visit (INDEPENDENT_AMBULATORY_CARE_PROVIDER_SITE_OTHER): Payer: Medicare Other | Admitting: Internal Medicine

## 2012-07-07 VITALS — BP 140/80 | HR 73 | Temp 98.0°F | Wt 95.0 lb

## 2012-07-07 DIAGNOSIS — I10 Essential (primary) hypertension: Secondary | ICD-10-CM

## 2012-07-07 DIAGNOSIS — K219 Gastro-esophageal reflux disease without esophagitis: Secondary | ICD-10-CM

## 2012-07-07 DIAGNOSIS — I6930 Unspecified sequelae of cerebral infarction: Secondary | ICD-10-CM

## 2012-07-07 DIAGNOSIS — I6990 Unspecified sequelae of unspecified cerebrovascular disease: Secondary | ICD-10-CM

## 2012-07-07 DIAGNOSIS — B0229 Other postherpetic nervous system involvement: Secondary | ICD-10-CM

## 2012-07-07 MED ORDER — MORPHINE SULFATE ER 15 MG PO TBCR: 15.0000 mg | EXTENDED_RELEASE_TABLET | Freq: Two times a day (BID) | ORAL | Status: DC

## 2012-07-07 MED ORDER — PANTOPRAZOLE SODIUM 40 MG PO TBEC: 40.0000 mg | DELAYED_RELEASE_TABLET | Freq: Two times a day (BID) | ORAL | Status: DC

## 2012-07-07 NOTE — Progress Notes (Signed)
Subjective:    Patient ID: [name], female    DOB: [birth year], 78 y.o.   MRN: [id number]  HPI Here with daughter Jeneen Montgomery back to every other day with the cymbalta Daughter notes change in responsiveness, trouble walking, etc Does provide some help with pain but not clear it was worth it Discussed stopping this again  Some trouble with throat Seems to be closed up Starts choking Lots of phlegm in the morning Has to drink water at night to help swallowing Is on the pantoprazole  Occ gets sense of short windedness Mouth stays dry  Current Outpatient Prescriptions on File Prior to Visit  Medication Sig Dispense Refill  . acyclovir (ZOVIRAX) 800 MG tablet Take 1 tablet (800 mg total) by mouth 4 (four) times daily.  120 tablet  11  . aspirin 81 MG tablet Take 81 mg by mouth daily.        [name] LORazepam (ATIVAN) 1 MG tablet Take 0.5-1 tablets (0.5-1 mg total) by mouth at bedtime.  30 tablet  1  . metoprolol succinate (TOPROL-XL) 25 MG 24 hr tablet Take 0.5 tablets (12.5 mg total) by mouth daily.      [name] morphine (MS CONTIN) 15 MG 12 hr tablet Take 1 tablet (15 mg total) by mouth 2 (two) times daily.  60 tablet  0  . Multiple Vitamin (MULTIVITAMIN) tablet Take 1 tablet by mouth daily.      . pantoprazole (PROTONIX) 40 MG tablet Take 1 tablet (40 mg total) by mouth 2 (two) times daily.  60 tablet  11  . simvastatin (ZOCOR) 20 MG tablet Take 1 tablet (20 mg total) by mouth at bedtime.  30 tablet  11  . topiramate (TOPAMAX) 100 MG tablet Take 1 tablet (100 mg total) by mouth at bedtime.        Allergies  Allergen Reactions  . Duloxetine     REACTION: N/V Mental status change and trouble with balance  . Omeprazole     REACTION: Didn't work  . Pregabalin     REACTION: SOB, swollen lips    Past Medical History  Diagnosis Date  . Breast cancer     Bilateral mastectomies 1986.  [name] GERD (gastroesophageal reflux disease)   . Hyperlipidemia   . Arthritis   . Depression   .  Zoster      with Post-herpetic neuralgia  . History of partial thyroidectomy   . Dyspnea     Echo (5/11) was a difficult study due to breast implants but showed normal LV and RV size and systolic function.      [name] Atypical chest pain     Lexiscan myoview (5/11) with EF 84%, normal wall motion, small fixed apical  perfusion defect likely due to breast attenuation, no evidence for ischemia or infarction.  **Patient had an  . CVA (cerebral infarction) 10/12    right lacunar  . Hypertension     Past Surgical History  Procedure Date  . Abdominal hysterectomy   . Mastectomy 1980    bilateral  . Vaginal delivery     No family history on file.  History   Social History  . Marital Status: Widowed    Spouse Name: N/A    Number of Children: 2  . Years of Education: N/A   Occupational History  . retired Public librarian    Social History Main Topics  . Smoking status: Never Smoker   . Smokeless tobacco: Never Used  . Alcohol Use: No  .  Drug Use: No  . Sexually Active: Not on file   Other Topics Concern  . Not on file   Social History Narrative   No living willNo health care POA but requests daughter [name] to do thisWould like attempts at resuscitationNo feeding tube if cognitively unaware   Review of Systems Sleeps okay Appetite is not too good Weight is down a couple of pounds    Objective:   Physical Exam  Constitutional: She appears well-developed. No distress.  Neck: Normal range of motion. Neck supple.  Cardiovascular: Normal rate, regular rhythm and normal heart sounds.  Exam reveals no gallop.   No murmur heard. Pulmonary/Chest: Effort normal and breath sounds normal. No respiratory distress. She has no wheezes. She has no rales.  Abdominal: Soft. There is no tenderness.  Musculoskeletal: She exhibits no edema and no tenderness.  Lymphadenopathy:    She has no cervical adenopathy.  Psychiatric: She has a normal mood and affect. Her behavior is normal.           Assessment & Plan:

## 2012-07-07 NOTE — Assessment & Plan Note (Signed)
Still with severe ongoing pain On acyclovir, narcotic, topiramate Sig side effects again on the cymbalta---will stop this

## 2012-07-07 NOTE — Patient Instructions (Signed)
Please increase the pantoprazole to twice a day. If your throat and swallowing problems are not better in 2-3 weeks, please call for referral to GI specialist

## 2012-07-07 NOTE — Assessment & Plan Note (Signed)
Stable status On ASA, statin and BP control

## 2012-07-07 NOTE — Assessment & Plan Note (Signed)
Now with some dysphagia Will increase the pantoprazole to bid If symptoms no better in 2 weeks or so, will go ahead with referral to [facility]

## 2012-07-07 NOTE — Assessment & Plan Note (Signed)
BP Readings from Last 3 Encounters:  07/07/12 140/80  03/24/12 128/70  02/11/12 118/72   Okay on low dose metoprolol No change now

## 2012-07-14 ENCOUNTER — Telehealth: Payer: Self-pay | Admitting: Internal Medicine

## 2012-07-14 NOTE — Telephone Encounter (Signed)
PC from Dr Evette Cristal Was seeing her  Noted the ongoing esophageal symptoms He will plan to go ahead and do EGD and will notify me of the results

## 2012-07-29 ENCOUNTER — Ambulatory Visit: Payer: Self-pay | Admitting: General Surgery

## 2012-08-01 ENCOUNTER — Telehealth: Payer: Self-pay | Admitting: Internal Medicine

## 2012-08-01 NOTE — Telephone Encounter (Signed)
Phone call from Dr Evette Cristal EGD looked completely normal He thinks there may be a pharyngeal constriction issue Recommended ENT---we discussed it and he is going to set this up with [facility]

## 2012-08-04 ENCOUNTER — Other Ambulatory Visit: Payer: Self-pay

## 2012-08-04 MED ORDER — MORPHINE SULFATE ER 15 MG PO TBCR: 15.0000 mg | EXTENDED_RELEASE_TABLET | Freq: Two times a day (BID) | ORAL | Status: DC

## 2012-08-04 NOTE — Telephone Encounter (Signed)
Spoke with patient and advised rx ready for pick-up and it will be at the front desk.  

## 2012-08-04 NOTE — Telephone Encounter (Signed)
Pt left v/m requesting rx morphine to be picked up today by daughter in law. Pt is out of med. Call when rx ready for pick up.

## 2012-08-12 ENCOUNTER — Encounter: Payer: Self-pay | Admitting: Internal Medicine

## 2012-08-20 ENCOUNTER — Other Ambulatory Visit: Payer: Self-pay | Admitting: *Deleted

## 2012-08-20 MED ORDER — LORAZEPAM 1 MG PO TABS: 0.5000 mg | ORAL_TABLET | Freq: Every day | ORAL | Status: DC

## 2012-08-20 NOTE — Telephone Encounter (Signed)
rx called into pharmacy

## 2012-08-20 NOTE — Telephone Encounter (Signed)
Okay #30 x 1 

## 2012-09-01 ENCOUNTER — Other Ambulatory Visit: Payer: Self-pay

## 2012-09-01 MED ORDER — MORPHINE SULFATE ER 15 MG PO TBCR: 15.0000 mg | EXTENDED_RELEASE_TABLET | Freq: Two times a day (BID) | ORAL | Status: DC

## 2012-09-01 NOTE — Telephone Encounter (Signed)
Spoke with patient and advised rx ready for pick-up and it will be at the front desk.  

## 2012-09-01 NOTE — Telephone Encounter (Signed)
pt left v/m requesting rx morphine. Call when ready for pick up.

## 2012-09-22 ENCOUNTER — Other Ambulatory Visit: Payer: Self-pay | Admitting: *Deleted

## 2012-09-22 MED ORDER — SIMVASTATIN 20 MG PO TABS: 20.0000 mg | ORAL_TABLET | Freq: Every day | ORAL | Status: DC

## 2012-10-01 ENCOUNTER — Other Ambulatory Visit: Payer: Self-pay | Admitting: Internal Medicine

## 2012-10-01 MED ORDER — MORPHINE SULFATE ER 15 MG PO TBCR: 15.0000 mg | EXTENDED_RELEASE_TABLET | Freq: Two times a day (BID) | ORAL | Status: DC

## 2012-10-01 NOTE — Telephone Encounter (Signed)
Spoke with patient and advised rx ready for pick-up and it will be at the front desk.  

## 2012-10-01 NOTE — Telephone Encounter (Signed)
Pt is calling to request a refill of her Morphine (MS Contin 15mg ).  Office please follow up with patient when RX is available for pick up.

## 2012-10-06 ENCOUNTER — Encounter: Payer: Self-pay | Admitting: Internal Medicine

## 2012-10-06 ENCOUNTER — Ambulatory Visit (INDEPENDENT_AMBULATORY_CARE_PROVIDER_SITE_OTHER): Payer: Medicare Other | Admitting: Internal Medicine

## 2012-10-06 VITALS — BP 116/58 | HR 79 | Temp 97.9°F | Wt 94.0 lb

## 2012-10-06 DIAGNOSIS — B0229 Other postherpetic nervous system involvement: Secondary | ICD-10-CM

## 2012-10-06 MED ORDER — MORPHINE SULFATE 15 MG PO TABS: 15.0000 mg | ORAL_TABLET | Freq: Two times a day (BID) | ORAL | Status: DC | PRN

## 2012-10-06 MED ORDER — MORPHINE SULFATE ER 30 MG PO TBCR: 30.0000 mg | EXTENDED_RELEASE_TABLET | Freq: Two times a day (BID) | ORAL | Status: DC

## 2012-10-06 NOTE — Assessment & Plan Note (Signed)
Pain has gotten very bad Discussed the options---she was on fentanyl in past and had problems Will increase the MS contin and give prn  Discussed stopping topiramate---- not sure if it is affecting her appetite (but I am not going to stop it in case it is helping)

## 2012-10-06 NOTE — Progress Notes (Signed)
Subjective:    Patient ID: [name], female    DOB: January 20, 1928, 79 y.o.   MRN: [id number]  HPI Here with daughter Still hurting bad in left V1 Has had a knot breakout just above left medial eyebrow Still on the morphine but only tylenol prn Got into trouble ("loopy") with percocet On the topiramate but doesn't seem to help Continues to lose weight---not much appetite  She thinks she eats fairly well Does okay when eats with daughters They will bring her food also  Trouble with swallowing still EGD was okay  Needs to get dentures readjusted  Current Outpatient Prescriptions on File Prior to Visit  Medication Sig Dispense Refill  . acyclovir (ZOVIRAX) 800 MG tablet Take 1 tablet (800 mg total) by mouth 4 (four) times daily.  120 tablet  11  . aspirin 81 MG tablet Take 81 mg by mouth daily.        [name] LORazepam (ATIVAN) 1 MG tablet Take 0.5-1 tablets (0.5-1 mg total) by mouth at bedtime.  30 tablet  1  . metoprolol succinate (TOPROL-XL) 25 MG 24 hr tablet Take 0.5 tablets (12.5 mg total) by mouth daily.      [name] morphine (MS CONTIN) 15 MG 12 hr tablet Take 1 tablet (15 mg total) by mouth 2 (two) times daily.  60 tablet  0  . Multiple Vitamin (MULTIVITAMIN) tablet Take 1 tablet by mouth daily.      . pantoprazole (PROTONIX) 40 MG tablet Take 1 tablet (40 mg total) by mouth 2 (two) times daily.  60 tablet  11  . simvastatin (ZOCOR) 20 MG tablet Take 1 tablet (20 mg total) by mouth at bedtime.  30 tablet  11  . topiramate (TOPAMAX) 100 MG tablet Take 1 tablet (100 mg total) by mouth at bedtime.       No current facility-administered medications on file prior to visit.    Allergies  Allergen Reactions  . Duloxetine     REACTION: N/V Mental status change and trouble with balance  . Omeprazole     REACTION: Didn't work  . Pregabalin     REACTION: SOB, swollen lips    Past Medical History  Diagnosis Date  . Breast cancer     Bilateral mastectomies 1986.  [name] GERD  (gastroesophageal reflux disease)   . Hyperlipidemia   . Arthritis   . Depression   . Zoster      with Post-herpetic neuralgia  . History of partial thyroidectomy   . Dyspnea     Echo (5/11) was a difficult study due to breast implants but showed normal LV and RV size and systolic function.      [name] Atypical chest pain     Lexiscan myoview (5/11) with EF 84%, normal wall motion, small fixed apical  perfusion defect likely due to breast attenuation, no evidence for ischemia or infarction.  **Patient had an  . CVA (cerebral infarction) 10/12    right lacunar  . Hypertension     Past Surgical History  Procedure Laterality Date  . Abdominal hysterectomy    . Mastectomy  1980    bilateral  . Vaginal delivery      No family history on file.  History   Social History  . Marital Status: Widowed    Spouse Name: N/A    Number of Children: 2  . Years of Education: N/A   Occupational History  . retired Public librarian    Social History Main Topics  . Smoking  status: Never Smoker   . Smokeless tobacco: Never Used  . Alcohol Use: No  . Drug Use: No  . Sexually Active: Not on file   Other Topics Concern  . Not on file   Social History Narrative   No living will   No health care POA but requests daughter [name] to do this   Would like attempts at resuscitation   No feeding tube if cognitively unaware   Review of Systems Weight down another 1# Sleep is interupted by the pain also     Objective:   Physical Exam  HENT:  Left canal and TM normal  Skin:  Has tender nodule above medial right eyebrow  Psychiatric:  Depressed about the painl          Assessment & Plan:

## 2012-10-06 NOTE — Patient Instructions (Signed)
Please increase the long acting morphine (contin) to 30mg  twice a day. You can use 2 of the 15mg  size until they run out.  I am giving you a rapid acting morphine also to take if your pain is really severe even after taking the other med. Try to limit this to no more than 1 dose a day for now

## 2012-10-14 ENCOUNTER — Telehealth: Payer: Self-pay

## 2012-10-14 NOTE — Telephone Encounter (Signed)
Pt has been taking Morphine 15 mg taking one tab twice a day. Pt and [name] are concerned about starting the Morphine 30 mg; fearful too strong for pt.Please advise.

## 2012-10-15 NOTE — Telephone Encounter (Signed)
.  left message to have patient's daughter return my call.  

## 2012-10-15 NOTE — Telephone Encounter (Signed)
She had been on a much higher dose in the past. I think she should increase. It may be reasonable to try 30 in the morning and 15 at night (or vice versa) for 2-3 days before going up to 30mg  twice a day----just to help her acclimate to the dose She really is suffering with the pain and needs a higher dose

## 2012-10-15 NOTE — Telephone Encounter (Signed)
[name] request call back at [phone].

## 2012-10-16 NOTE — Telephone Encounter (Signed)
Spoke with patient and she states she's taking the " blue pill" three times a day and it's working, I still asked her to have her daughter return my call.

## 2012-10-20 ENCOUNTER — Other Ambulatory Visit: Payer: Self-pay | Admitting: Family Medicine

## 2012-10-20 MED ORDER — LORAZEPAM 1 MG PO TABS: 0.5000 mg | ORAL_TABLET | Freq: Every day | ORAL | Status: DC

## 2012-10-20 NOTE — Telephone Encounter (Signed)
rx called into pharmacy

## 2012-10-20 NOTE — Telephone Encounter (Signed)
Okay #30 x 1 

## 2012-10-20 NOTE — Telephone Encounter (Signed)
Last filled 09/22/12 

## 2012-10-28 ENCOUNTER — Other Ambulatory Visit: Payer: Self-pay

## 2012-10-28 NOTE — Telephone Encounter (Signed)
Pt left v/m requesting rx morphine 15 mg. Call when ready for pick up.pt will be out of med in next couple of days.

## 2012-10-29 MED ORDER — MORPHINE SULFATE 15 MG PO TABS: 15.0000 mg | ORAL_TABLET | Freq: Two times a day (BID) | ORAL | Status: DC | PRN

## 2012-10-29 NOTE — Telephone Encounter (Signed)
Spoke with patient and advised rx ready for pick-up and it will be at the front desk.  

## 2012-10-31 ENCOUNTER — Other Ambulatory Visit: Payer: Self-pay | Admitting: *Deleted

## 2012-10-31 MED ORDER — METOPROLOL SUCCINATE ER 25 MG PO TB24: 12.5000 mg | ORAL_TABLET | Freq: Every day | ORAL | Status: DC

## 2012-11-03 ENCOUNTER — Ambulatory Visit: Payer: Medicare Other | Admitting: Internal Medicine

## 2012-11-17 ENCOUNTER — Ambulatory Visit: Payer: Medicare Other | Admitting: Internal Medicine

## 2012-12-12 ENCOUNTER — Encounter: Payer: Self-pay | Admitting: Radiology

## 2012-12-15 ENCOUNTER — Encounter: Payer: Self-pay | Admitting: Internal Medicine

## 2012-12-15 ENCOUNTER — Ambulatory Visit (INDEPENDENT_AMBULATORY_CARE_PROVIDER_SITE_OTHER): Payer: Medicare Other | Admitting: Internal Medicine

## 2012-12-15 VITALS — BP 120/80 | HR 67 | Temp 97.8°F | Wt 93.0 lb

## 2012-12-15 DIAGNOSIS — R636 Underweight: Secondary | ICD-10-CM

## 2012-12-15 DIAGNOSIS — L309 Dermatitis, unspecified: Secondary | ICD-10-CM | POA: Insufficient documentation

## 2012-12-15 DIAGNOSIS — B0229 Other postherpetic nervous system involvement: Secondary | ICD-10-CM

## 2012-12-15 DIAGNOSIS — L259 Unspecified contact dermatitis, unspecified cause: Secondary | ICD-10-CM

## 2012-12-15 MED ORDER — TRIAMCINOLONE ACETONIDE 0.1 % EX CREA: TOPICAL_CREAM | Freq: Two times a day (BID) | CUTANEOUS | Status: DC | PRN

## 2012-12-15 MED ORDER — MORPHINE SULFATE 15 MG PO TABS
15.0000 mg | ORAL_TABLET | Freq: Three times a day (TID) | ORAL | Status: DC
Start: 2012-12-15 — End: 2013-01-26

## 2012-12-15 NOTE — Assessment & Plan Note (Addendum)
Partially from mistakenly using [employer] thinking it was moisturizer and from scratching  Will try TAC

## 2012-12-15 NOTE — Assessment & Plan Note (Signed)
Finally seems better Uses the 15mg  morphine tid some days

## 2012-12-15 NOTE — Progress Notes (Signed)
Subjective:    Patient ID: [name], female    DOB: 1927/08/11, 79 y.o.   MRN: [id number]  HPI Shingles pain is better Didn't go up to 30mg  --was afraid of the higher dose Now is on the 15mg  bid and takes a third dose when she needs it Uses tylenol at times also  Still not eating that much Daughter does make sure she eats Drinks ensure 3 per day Weight still down a pounds  Has rash on her arms Itches a lot Was using palmolive dishwashing liquid thinking it was lotion Left posterior shoulder/neck antecubital fossa  Current Outpatient Prescriptions on File Prior to Visit  Medication Sig Dispense Refill  . acyclovir (ZOVIRAX) 800 MG tablet Take 1 tablet (800 mg total) by mouth 4 (four) times daily.  120 tablet  11  . aspirin 81 MG tablet Take 81 mg by mouth daily.        [name] LORazepam (ATIVAN) 1 MG tablet Take 0.5-1 tablets (0.5-1 mg total) by mouth at bedtime.  30 tablet  1  . metoprolol succinate (TOPROL-XL) 25 MG 24 hr tablet Take 0.5 tablets (12.5 mg total) by mouth daily.  15 tablet  5  . morphine (MSIR) 15 MG tablet Take 1 tablet (15 mg total) by mouth 2 (two) times daily as needed for pain.  60 tablet  0  . Multiple Vitamin (MULTIVITAMIN) tablet Take 1 tablet by mouth daily.      . pantoprazole (PROTONIX) 40 MG tablet Take 1 tablet (40 mg total) by mouth 2 (two) times daily.  60 tablet  11  . simvastatin (ZOCOR) 20 MG tablet Take 1 tablet (20 mg total) by mouth at bedtime.  30 tablet  11  . topiramate (TOPAMAX) 100 MG tablet Take 1 tablet (100 mg total) by mouth at bedtime.       No current facility-administered medications on file prior to visit.    Allergies  Allergen Reactions  . Duloxetine     REACTION: N/V Mental status change and trouble with balance  . Omeprazole     REACTION: Didn't work  . Pregabalin     REACTION: SOB, swollen lips    Past Medical History  Diagnosis Date  . Breast cancer     Bilateral mastectomies 1986.  [name] GERD (gastroesophageal  reflux disease)   . Hyperlipidemia   . Arthritis   . Depression   . Zoster      with Post-herpetic neuralgia  . History of partial thyroidectomy   . Dyspnea     Echo (5/11) was a difficult study due to breast implants but showed normal LV and RV size and systolic function.      [name] Atypical chest pain     Lexiscan myoview (5/11) with EF 84%, normal wall motion, small fixed apical  perfusion defect likely due to breast attenuation, no evidence for ischemia or infarction.  **Patient had an  . CVA (cerebral infarction) 10/12    right lacunar  . Hypertension     Past Surgical History  Procedure Laterality Date  . Abdominal hysterectomy    . Mastectomy  1980    bilateral  . Vaginal delivery      No family history on file.  History   Social History  . Marital Status: Widowed    Spouse Name: N/A    Number of Children: 2  . Years of Education: N/A   Occupational History  . retired Public librarian    Social History Main Topics  .  Smoking status: Never Smoker   . Smokeless tobacco: Never Used  . Alcohol Use: No  . Drug Use: No  . Sexually Active: Not on file   Other Topics Concern  . Not on file   Social History Narrative   No living will   No health care POA but requests daughter [name] to do this   Would like attempts at resuscitation   No feeding tube if cognitively unaware   Review of Systems Still having trouble with her eyes---can't see out of right eye Sleeps fairly well with meds at night    Objective:   Physical Exam  Constitutional: No distress.  Thin but looks the same  Skin:  Dry areas with excoriations on left posterior shoulder Striae noticeable on antecubital fossae and neck          Assessment & Plan:

## 2012-12-15 NOTE — Assessment & Plan Note (Signed)
Eating okay but still can't gain weight Not sure if the topiramate Discussed stopping this but she is finally doing better with the pain If her weight goes down more, will try to wean

## 2012-12-15 NOTE — Patient Instructions (Signed)
Please check your weight about twice a week. If it drops more, we will try decreasing and stopping the topiramate

## 2012-12-24 ENCOUNTER — Encounter: Payer: Self-pay | Admitting: Internal Medicine

## 2012-12-30 ENCOUNTER — Other Ambulatory Visit: Payer: Self-pay

## 2012-12-30 NOTE — Telephone Encounter (Signed)
[facility] left v/m requesting refill lorazepam.Please advise.

## 2012-12-31 ENCOUNTER — Telehealth: Payer: Self-pay

## 2012-12-31 ENCOUNTER — Encounter: Payer: Self-pay | Admitting: *Deleted

## 2012-12-31 MED ORDER — LORAZEPAM 1 MG PO TABS: 0.5000 mg | ORAL_TABLET | Freq: Every day | ORAL | Status: DC

## 2012-12-31 NOTE — Telephone Encounter (Signed)
rx called into pharmacy

## 2012-12-31 NOTE — Telephone Encounter (Signed)
Okay #30 x 0 

## 2012-12-31 NOTE — Telephone Encounter (Signed)
[name] with [facility] pharmacy left v/m to clarify Metoprolol instructions. When I called Brianna back she said already spoken with someone and does not need further clarification.

## 2013-01-06 DIAGNOSIS — K56600 Partial intestinal obstruction, unspecified as to cause: Secondary | ICD-10-CM

## 2013-01-06 HISTORY — DX: Partial intestinal obstruction, unspecified as to cause: K56.600

## 2013-01-16 ENCOUNTER — Telehealth: Payer: Self-pay

## 2013-01-16 NOTE — Telephone Encounter (Signed)
Message rec at 5:30- due to level of pain I recommended urgent eval at [facility]

## 2013-01-16 NOTE — Telephone Encounter (Signed)
[name] pts daughter said has left message with [facility] and waiting for cb but pt having sharp pain in upper stomach after eating tomato sandwich at lunch today; thinks caused by gas. Pt has taken 2 Tums, and used a suppository to try to have BM. Pt had small constipation BM. Pt has hx of constipation. Pt pain level now is 8. No fever, Nausea or vomiting. [employer].

## 2013-01-16 NOTE — Telephone Encounter (Signed)
Dr Milinda Antis advised with pain level of 8 to go to [facility] for eval. [name] voiced understanding and will take pt to [facility].

## 2013-01-17 ENCOUNTER — Inpatient Hospital Stay: Payer: Self-pay | Admitting: General Surgery

## 2013-01-17 LAB — URINALYSIS, COMPLETE
Blood: NEGATIVE
Glucose,UR: NEGATIVE mg/dL (ref 0–75)
Nitrite: NEGATIVE
Protein: NEGATIVE
RBC,UR: 7 /HPF (ref 0–5)
WBC UR: 3 /HPF (ref 0–5)

## 2013-01-17 LAB — CBC WITH DIFFERENTIAL/PLATELET
Basophil #: 0.2 10*3/uL — ABNORMAL HIGH (ref 0.0–0.1)
Basophil %: 1.9 %
Eosinophil %: 0 %
HCT: 43.8 % (ref 35.0–47.0)
HGB: 14.5 g/dL (ref 12.0–16.0)
Lymphocyte #: 0.8 10*3/uL — ABNORMAL LOW (ref 1.0–3.6)
Lymphocyte %: 7.9 %
MCH: 33.7 pg (ref 26.0–34.0)
MCHC: 33.1 g/dL (ref 32.0–36.0)
MCV: 102 fL — ABNORMAL HIGH (ref 80–100)
Monocyte #: 0.4 x10 3/mm (ref 0.2–0.9)
RBC: 4.3 10*6/uL (ref 3.80–5.20)
RDW: 12.8 % (ref 11.5–14.5)
WBC: 10.3 10*3/uL (ref 3.6–11.0)

## 2013-01-17 LAB — COMPREHENSIVE METABOLIC PANEL
Alkaline Phosphatase: 76 U/L (ref 50–136)
BUN: 16 mg/dL (ref 7–18)
Chloride: 108 mmol/L — ABNORMAL HIGH (ref 98–107)
Creatinine: 1.01 mg/dL (ref 0.60–1.30)
EGFR (African American): 60 — ABNORMAL LOW
Osmolality: 283 (ref 275–301)
Potassium: 3.8 mmol/L (ref 3.5–5.1)
SGOT(AST): 19 U/L (ref 15–37)
Sodium: 140 mmol/L (ref 136–145)
Total Protein: 7.4 g/dL (ref 6.4–8.2)

## 2013-01-17 LAB — LIPASE, BLOOD: Lipase: 111 U/L (ref 73–393)

## 2013-01-17 NOTE — Telephone Encounter (Signed)
Please check on her on Monday 

## 2013-01-19 ENCOUNTER — Telehealth: Payer: Self-pay | Admitting: Family Medicine

## 2013-01-19 ENCOUNTER — Encounter: Payer: Self-pay | Admitting: General Surgery

## 2013-01-19 LAB — CBC WITH DIFFERENTIAL/PLATELET
Basophil #: 0 10*3/uL (ref 0.0–0.1)
Basophil %: 0.2 %
Eosinophil #: 0.1 10*3/uL (ref 0.0–0.7)
HCT: 39 % (ref 35.0–47.0)
Lymphocyte #: 2.3 10*3/uL (ref 1.0–3.6)
Lymphocyte %: 18.3 %
MCH: 34.1 pg — ABNORMAL HIGH (ref 26.0–34.0)
MCHC: 33.9 g/dL (ref 32.0–36.0)
MCV: 101 fL — ABNORMAL HIGH (ref 80–100)
Monocyte #: 1.4 x10 3/mm — ABNORMAL HIGH (ref 0.2–0.9)
Monocyte %: 10.9 %
Neutrophil #: 8.8 10*3/uL — ABNORMAL HIGH (ref 1.4–6.5)
Neutrophil %: 70.1 %
RBC: 3.87 10*6/uL (ref 3.80–5.20)
RDW: 12.9 % (ref 11.5–14.5)
WBC: 12.6 10*3/uL — ABNORMAL HIGH (ref 3.6–11.0)

## 2013-01-19 LAB — BASIC METABOLIC PANEL
BUN: 12 mg/dL (ref 7–18)
Calcium, Total: 8.3 mg/dL — ABNORMAL LOW (ref 8.5–10.1)
Co2: 27 mmol/L (ref 21–32)
Creatinine: 0.88 mg/dL (ref 0.60–1.30)
Glucose: 124 mg/dL — ABNORMAL HIGH (ref 65–99)
Sodium: 142 mmol/L (ref 136–145)

## 2013-01-19 NOTE — Telephone Encounter (Signed)
.  left message to have patient return my call.  

## 2013-01-19 NOTE — Telephone Encounter (Signed)
Pt is in the hospital, Dr. Alphonsus Sias has tried to call

## 2013-01-19 NOTE — Telephone Encounter (Signed)
Please check on her to see what was going on

## 2013-01-19 NOTE — Telephone Encounter (Signed)
Confidential Office Message [address] [place], [place] [address] p. [phone] f. [phone] To: Gar Gibbon (After Hours Triage) Fax: [phone] From: [facility] Date/ Time: 01/17/2013 11:41 AM Taken By: April Loura Back, RN Caller: [name] Facility: Not Collected Patient: [name], [name] DOB: 03-Jul-1928 Phone: [phone] Reason for Call: Caller was unable to be reached on callback - Left Message Regarding Appointment: No Appt Date: Appt Time: Unknown Provider: Reason: Details: Outcome: Confidential

## 2013-01-20 LAB — CBC WITH DIFFERENTIAL/PLATELET
Basophil #: 0 10*3/uL (ref 0.0–0.1)
Basophil %: 0.4 %
Eosinophil %: 1.1 %
HCT: 36 % (ref 35.0–47.0)
Lymphocyte %: 21.7 %
MCH: 34.4 pg — ABNORMAL HIGH (ref 26.0–34.0)
Monocyte #: 1 x10 3/mm — ABNORMAL HIGH (ref 0.2–0.9)
Monocyte %: 10.5 %
Neutrophil #: 6.3 10*3/uL (ref 1.4–6.5)
Neutrophil %: 66.3 %
RBC: 3.61 10*6/uL — ABNORMAL LOW (ref 3.80–5.20)
RDW: 12.4 % (ref 11.5–14.5)
WBC: 9.5 10*3/uL (ref 3.6–11.0)

## 2013-01-21 ENCOUNTER — Encounter: Payer: Self-pay | Admitting: General Surgery

## 2013-01-22 LAB — CBC WITH DIFFERENTIAL/PLATELET
Basophil #: 0 10*3/uL (ref 0.0–0.1)
Basophil %: 0.5 %
Eosinophil #: 0.1 10*3/uL (ref 0.0–0.7)
Eosinophil %: 1.8 %
HCT: 39.4 % (ref 35.0–47.0)
HGB: 13.7 g/dL (ref 12.0–16.0)
Lymphocyte %: 26 %
MCH: 34.5 pg — ABNORMAL HIGH (ref 26.0–34.0)
Monocyte #: 0.8 x10 3/mm (ref 0.2–0.9)
Neutrophil %: 61.3 %
RBC: 3.96 10*6/uL (ref 3.80–5.20)
RDW: 12.5 % (ref 11.5–14.5)
WBC: 8.1 10*3/uL (ref 3.6–11.0)

## 2013-01-22 LAB — URINALYSIS, COMPLETE
Bacteria: NONE SEEN
Bilirubin,UR: NEGATIVE
Glucose,UR: NEGATIVE mg/dL (ref 0–75)
Ketone: NEGATIVE
Leukocyte Esterase: NEGATIVE
Nitrite: NEGATIVE
RBC,UR: 1 /HPF (ref 0–5)
Specific Gravity: 1.002 (ref 1.003–1.030)
Squamous Epithelial: <1

## 2013-01-23 ENCOUNTER — Encounter: Payer: Self-pay | Admitting: General Surgery

## 2013-01-23 DIAGNOSIS — K565 Intestinal adhesions [bands], unspecified as to partial versus complete obstruction: Secondary | ICD-10-CM

## 2013-01-23 LAB — URINE CULTURE

## 2013-01-26 ENCOUNTER — Other Ambulatory Visit: Payer: Self-pay

## 2013-01-26 NOTE — Telephone Encounter (Signed)
Lady left v/m requesting rx morphine. Call when ready for pick up.

## 2013-01-27 MED ORDER — MORPHINE SULFATE 15 MG PO TABS: 15.0000 mg | ORAL_TABLET | Freq: Three times a day (TID) | ORAL | Status: DC

## 2013-01-27 NOTE — Telephone Encounter (Signed)
Spoke with daughter and advised results. Pt is going to see Dr. Rosezetta Schlatter Thursday @ 3

## 2013-01-27 NOTE — Telephone Encounter (Signed)
Please call and see how she is doing. Just in hospital for partial intestinal blockage If she doesn't have a follow up with surgeon, set her up to come in to see me later this week to make sure she is doing okay

## 2013-01-29 ENCOUNTER — Encounter: Payer: Self-pay | Admitting: General Surgery

## 2013-01-29 ENCOUNTER — Ambulatory Visit (INDEPENDENT_AMBULATORY_CARE_PROVIDER_SITE_OTHER): Payer: Medicare Other | Admitting: General Surgery

## 2013-01-29 ENCOUNTER — Other Ambulatory Visit: Payer: Self-pay | Admitting: *Deleted

## 2013-01-29 VITALS — BP 112/58 | Ht 66.0 in | Wt 93.0 lb

## 2013-01-29 DIAGNOSIS — K56609 Unspecified intestinal obstruction, unspecified as to partial versus complete obstruction: Secondary | ICD-10-CM

## 2013-01-29 DIAGNOSIS — K56600 Partial intestinal obstruction, unspecified as to cause: Secondary | ICD-10-CM

## 2013-01-29 NOTE — Patient Instructions (Addendum)
The patient is aware to call back for any questions or concerns.  X ray of abdomen, will call with results  May use over the counter suppositories for a bowel movement

## 2013-01-29 NOTE — Progress Notes (Signed)
Patient to have an abdomen x-ray flat and upright tomorrow, 01-30-13. This patient and her daughter are aware this is a walk-in x-ray and they can have completed at [facility] or [facility].

## 2013-01-29 NOTE — Progress Notes (Signed)
Patient ID: [name], female   DOB: 1927/11/20, 79 y.o.   MRN: [id number]  Chief Complaint  Patient presents with  . Other    bowel obstruction     HPI [name] is a 79 y.o. female here today following up from the hospital -partial small bowel bowel obstruction (01/17/13-01/23/13). She was treated conservatively and recovered well. Patient states she is doing well since her hospital visit. Has not had a bowel movement since 01/24/13. Good appetite. HPI  Past Medical History  Diagnosis Date  . Breast cancer     Bilateral mastectomies 1986.  [name] GERD (gastroesophageal reflux disease)   . Hyperlipidemia   . Arthritis   . Depression   . Zoster      with Post-herpetic neuralgia  . History of partial thyroidectomy   . Dyspnea     Echo (5/11) was a difficult study due to breast implants but showed normal LV and RV size and systolic function.      [name] Atypical chest pain     Lexiscan myoview (5/11) with EF 84%, normal wall motion, small fixed apical  perfusion defect likely due to breast attenuation, no evidence for ischemia or infarction.  **Patient had an  . CVA (cerebral infarction) 10/12    right lacunar  . Hypertension   . Obstruction of intestine     Past Surgical History  Procedure Laterality Date  . Abdominal hysterectomy    . Mastectomy  1980    bilateral  . Vaginal delivery      History reviewed. No pertinent family history.  Social History History  Substance Use Topics  . Smoking status: Never Smoker   . Smokeless tobacco: Never Used  . Alcohol Use: No    Allergies  Allergen Reactions  . Duloxetine     REACTION: N/V Mental status change and trouble with balance  . Omeprazole     REACTION: Didn't work  . Pregabalin     REACTION: SOB, swollen lips    Current Outpatient Prescriptions  Medication Sig Dispense Refill  . acyclovir (ZOVIRAX) 800 MG tablet Take 1 tablet (800 mg total) by mouth 4 (four) times daily.  120 tablet  11  . aspirin 81 MG tablet  Take 81 mg by mouth daily.        [name] LORazepam (ATIVAN) 1 MG tablet Take 0.5-1 tablets (0.5-1 mg total) by mouth at bedtime.  30 tablet  0  . metoprolol succinate (TOPROL-XL) 25 MG 24 hr tablet Take 0.5 tablets (12.5 mg total) by mouth daily.  15 tablet  5  . morphine (MSIR) 15 MG tablet Take 1 tablet (15 mg total) by mouth 3 (three) times daily.  90 tablet  0  . Multiple Vitamin (MULTIVITAMIN) tablet Take 1 tablet by mouth daily.      . pantoprazole (PROTONIX) 40 MG tablet Take 1 tablet (40 mg total) by mouth 2 (two) times daily.  60 tablet  11  . prednisoLONE acetate (PRED FORTE) 1 % ophthalmic suspension       . simvastatin (ZOCOR) 20 MG tablet Take 1 tablet (20 mg total) by mouth at bedtime.  30 tablet  11  . topiramate (TOPAMAX) 100 MG tablet Take 1 tablet (100 mg total) by mouth at bedtime.       No current facility-administered medications for this visit.    Review of Systems Review of Systems  Constitutional: Negative.   Respiratory: Negative.   Cardiovascular: Negative.     Blood pressure 112/58, height  5\' 6"  (1.676 m), weight 93 lb (42.185 kg).  Physical Exam Physical Exam  Constitutional: She is oriented to person, place, and time. She appears well-developed.  Eyes: Conjunctivae are normal.  Cardiovascular: Normal rate and regular rhythm.   Pulmonary/Chest: Effort normal and breath sounds normal.  Abdominal: Soft. Bowel sounds are normal. There is no tenderness.  Lymphadenopathy:    She has no cervical adenopathy.  Neurological: She is alert and oriented to person, place, and time.  Skin: Skin is warm and dry.    Data Reviewed    Assessment    Clinicall resolved PSBO     Plan    F/U flat and upright abdomen.  If normal, no further plans. If she still has some dilated proximal small bowel will discuss laparoscopic evaluation.       Manvir Prabhu G 01/29/2013, 4:50 PM

## 2013-01-30 ENCOUNTER — Ambulatory Visit: Payer: Self-pay | Admitting: General Surgery

## 2013-02-02 ENCOUNTER — Encounter: Payer: Self-pay | Admitting: General Surgery

## 2013-02-03 ENCOUNTER — Other Ambulatory Visit: Payer: Self-pay | Admitting: Internal Medicine

## 2013-02-03 NOTE — Telephone Encounter (Signed)
Okay #30 x 0 

## 2013-02-03 NOTE — Telephone Encounter (Signed)
rx called into pharmacy

## 2013-02-05 ENCOUNTER — Telehealth: Payer: Self-pay | Admitting: *Deleted

## 2013-02-05 NOTE — Telephone Encounter (Signed)
Message copied by Currie Paris on Thu Feb 05, 2013  8:42 AM ------      Message from: Kieth Brightly      Created: Wed Feb 04, 2013  6:35 PM       Abd xray is norma. Please inform pt and f/u in 2 mos or prn for any recurrence of abd symptoms. ------

## 2013-02-05 NOTE — Telephone Encounter (Signed)
Notified patient as instructed, patient pleased. Discussed follow-up appointments, patient agrees  

## 2013-02-23 ENCOUNTER — Other Ambulatory Visit: Payer: Self-pay | Admitting: Internal Medicine

## 2013-03-06 ENCOUNTER — Other Ambulatory Visit: Payer: Self-pay | Admitting: Internal Medicine

## 2013-03-10 ENCOUNTER — Other Ambulatory Visit: Payer: Self-pay | Admitting: *Deleted

## 2013-03-10 MED ORDER — MORPHINE SULFATE 15 MG PO TABS: 15.0000 mg | ORAL_TABLET | Freq: Three times a day (TID) | ORAL | Status: DC

## 2013-03-10 NOTE — Telephone Encounter (Signed)
rx called into pharmacy

## 2013-03-10 NOTE — Telephone Encounter (Signed)
Pt calls requesting her morphine rx.

## 2013-03-10 NOTE — Telephone Encounter (Signed)
Spoke with patient and advised rx ready for pick-up and it will be at the front desk.  

## 2013-03-10 NOTE — Telephone Encounter (Signed)
Okay #30 x 0 

## 2013-04-02 ENCOUNTER — Other Ambulatory Visit: Payer: Self-pay | Admitting: Internal Medicine

## 2013-04-03 NOTE — Telephone Encounter (Signed)
rx called into pharmacy

## 2013-04-03 NOTE — Telephone Encounter (Signed)
Okay #30 x 0 

## 2013-04-06 ENCOUNTER — Ambulatory Visit: Payer: Medicare Other | Admitting: General Surgery

## 2013-04-10 ENCOUNTER — Other Ambulatory Visit: Payer: Self-pay

## 2013-04-10 MED ORDER — MORPHINE SULFATE 15 MG PO TABS: 15.0000 mg | ORAL_TABLET | Freq: Three times a day (TID) | ORAL | Status: DC

## 2013-04-10 NOTE — Telephone Encounter (Signed)
Spoke with patient and advised rx ready for pick-up and it will be at the front desk.  

## 2013-04-10 NOTE — Telephone Encounter (Signed)
Pt left v/m requesting rx morphine. Pt is almost out of med. Pt request call back when rx ready for pick up.

## 2013-04-16 ENCOUNTER — Ambulatory Visit (INDEPENDENT_AMBULATORY_CARE_PROVIDER_SITE_OTHER): Payer: Medicare Other | Admitting: General Surgery

## 2013-04-16 ENCOUNTER — Encounter: Payer: Self-pay | Admitting: General Surgery

## 2013-04-16 ENCOUNTER — Ambulatory Visit: Payer: Medicare Other | Admitting: Internal Medicine

## 2013-04-16 VITALS — BP 120/70 | HR 74 | Resp 16 | Ht 66.0 in | Wt 93.0 lb

## 2013-04-16 DIAGNOSIS — K56609 Unspecified intestinal obstruction, unspecified as to partial versus complete obstruction: Secondary | ICD-10-CM

## 2013-04-16 DIAGNOSIS — K56600 Partial intestinal obstruction, unspecified as to cause: Secondary | ICD-10-CM

## 2013-04-16 NOTE — Progress Notes (Signed)
Patient ID: [name], female   DOB: Oct 13, 1927, 79 y.o.   MRN: [id number]  Chief Complaint  Patient presents with  . Abdominal Pain    HPI [name] is a 79 y.o. female here today following up from the hospital -partial small bowel bowel obstruction (01/17/13-01/23/13). She was treated conservatively and obstruction resolved. Patient states she is doing well   HPI  Past Medical History  Diagnosis Date  . Breast cancer     Bilateral mastectomies 1986.  [name] GERD (gastroesophageal reflux disease)   . Hyperlipidemia   . Arthritis   . Depression   . Zoster      with Post-herpetic neuralgia  . History of partial thyroidectomy   . Dyspnea     Echo (5/11) was a difficult study due to breast implants but showed normal LV and RV size and systolic function.      [name] Atypical chest pain     Lexiscan myoview (5/11) with EF 84%, normal wall motion, small fixed apical  perfusion defect likely due to breast attenuation, no evidence for ischemia or infarction.  **Patient had an  . CVA (cerebral infarction) 10/12    right lacunar  . Hypertension   . Obstruction of intestine     Past Surgical History  Procedure Laterality Date  . Abdominal hysterectomy    . Mastectomy  1980    bilateral  . Vaginal delivery      History reviewed. No pertinent family history.  Social History History  Substance Use Topics  . Smoking status: Never Smoker   . Smokeless tobacco: Never Used  . Alcohol Use: No    Allergies  Allergen Reactions  . Duloxetine     REACTION: N/V Mental status change and trouble with balance  . Omeprazole     REACTION: Didn't work  . Pregabalin     REACTION: SOB, swollen lips    Current Outpatient Prescriptions  Medication Sig Dispense Refill  . acyclovir (ZOVIRAX) 800 MG tablet Take 1 tablet (800 mg total) by mouth 4 (four) times daily.  120 tablet  11  . aspirin 81 MG tablet Take 81 mg by mouth daily.        [name] LORazepam (ATIVAN) 1 MG tablet TAKE 1/2 TO 1 TABLET  AT BEDTIME  30 tablet  0  . metoprolol succinate (TOPROL-XL) 25 MG 24 hr tablet Take 0.5 tablets (12.5 mg total) by mouth daily.  15 tablet  5  . morphine (MSIR) 15 MG tablet Take 1 tablet (15 mg total) by mouth 3 (three) times daily.  90 tablet  0  . Multiple Vitamin (MULTIVITAMIN) tablet Take 1 tablet by mouth daily.      . pantoprazole (PROTONIX) 40 MG tablet Take 1 tablet (40 mg total) by mouth 2 (two) times daily.  60 tablet  11  . prednisoLONE acetate (PRED FORTE) 1 % ophthalmic suspension       . simvastatin (ZOCOR) 20 MG tablet Take 1 tablet (20 mg total) by mouth at bedtime.  30 tablet  11  . topiramate (TOPAMAX) 100 MG tablet TAKE 1 TABLET BY MOUTH TWICE A DAY  60 tablet  11   No current facility-administered medications for this visit.    Review of Systems Review of Systems  Constitutional: Negative.   Respiratory: Negative.   Cardiovascular: Negative.     Blood pressure 120/70, pulse 74, resp. rate 16, height 5\' 6"  (1.676 m), weight 93 lb (42.185 kg).  Physical Exam Physical Exam  Constitutional:  She is oriented to person, place, and time. She appears well-developed and well-nourished.  Eyes: No scleral icterus.  Neck: No mass and no thyromegaly present.  Cardiovascular: Normal rate, regular rhythm, normal heart sounds, intact distal pulses and normal pulses.   Pulses:      Dorsalis pedis pulses are 2+ on the right side, and 2+ on the left side.       Posterior tibial pulses are 2+ on the right side, and 2+ on the left side.  Pulmonary/Chest: Breath sounds normal.  Abdominal: Soft. Normal appearance and bowel sounds are normal. There is no hepatomegaly. There is no tenderness. No hernia.  Lymphadenopathy:    She has no cervical adenopathy.  Neurological: She is alert and oriented to person, place, and time.  Skin: Skin is warm and dry.    Data Reviewed    Assessment    Stable exam. No recurrence of abd symptoms    Plan    Patient to return as needed, call  us with any problems.        [place] Throckmorton G 04/16/2013, 7:20 PM

## 2013-04-16 NOTE — Patient Instructions (Addendum)
Patient to return in Jan  2015 carotid doppler . Advised increasing dietary intake. Patient to call us with any problems.

## 2013-04-23 ENCOUNTER — Encounter: Payer: Self-pay | Admitting: Internal Medicine

## 2013-04-23 ENCOUNTER — Ambulatory Visit (INDEPENDENT_AMBULATORY_CARE_PROVIDER_SITE_OTHER): Payer: Medicare Other | Admitting: Internal Medicine

## 2013-04-23 VITALS — BP 128/80 | HR 63 | Temp 98.5°F | Wt 94.0 lb

## 2013-04-23 DIAGNOSIS — I10 Essential (primary) hypertension: Secondary | ICD-10-CM

## 2013-04-23 DIAGNOSIS — B0229 Other postherpetic nervous system involvement: Secondary | ICD-10-CM

## 2013-04-23 DIAGNOSIS — E785 Hyperlipidemia, unspecified: Secondary | ICD-10-CM

## 2013-04-23 DIAGNOSIS — F3289 Other specified depressive episodes: Secondary | ICD-10-CM

## 2013-04-23 DIAGNOSIS — F329 Major depressive disorder, single episode, unspecified: Secondary | ICD-10-CM

## 2013-04-23 NOTE — Assessment & Plan Note (Signed)
BP Readings from Last 3 Encounters:  04/23/13 128/80  04/16/13 120/70  01/29/13 112/58   This is fine

## 2013-04-23 NOTE — Progress Notes (Signed)
Subjective:    Patient ID: [name], female    DOB: [birth year], 79 y.o.   MRN: [id number]  HPI Here with daughter Reviewed partial SBO admission Bowels are some better Eats prunes every night Goes every 2-3 days and feels like she empties  Still not eating great Food doesn't taste right to her (since nasal procedure)  Still has the right temporal head pain Especially bad in the evening Taking the morphine 2-3 times a day  No chest pain No SOB No edema  Still has depressed mood---every day Tries to get out and walk--this helps Doesn't want to try any meds again (has failed many) Lorazepam helps sleep-hasn't used it in the day  Current Outpatient Prescriptions on File Prior to Visit  Medication Sig Dispense Refill  . acyclovir (ZOVIRAX) 800 MG tablet Take 1 tablet (800 mg total) by mouth 4 (four) times daily.  120 tablet  11  . aspirin 81 MG tablet Take 81 mg by mouth daily.        [name] LORazepam (ATIVAN) 1 MG tablet TAKE 1/2 TO 1 TABLET AT BEDTIME  30 tablet  0  . metoprolol succinate (TOPROL-XL) 25 MG 24 hr tablet Take 0.5 tablets (12.5 mg total) by mouth daily.  15 tablet  5  . morphine (MSIR) 15 MG tablet Take 1 tablet (15 mg total) by mouth 3 (three) times daily.  90 tablet  0  . Multiple Vitamin (MULTIVITAMIN) tablet Take 1 tablet by mouth daily.      . pantoprazole (PROTONIX) 40 MG tablet Take 1 tablet (40 mg total) by mouth 2 (two) times daily.  60 tablet  11  . prednisoLONE acetate (PRED FORTE) 1 % ophthalmic suspension       . simvastatin (ZOCOR) 20 MG tablet Take 1 tablet (20 mg total) by mouth at bedtime.  30 tablet  11  . topiramate (TOPAMAX) 100 MG tablet TAKE 1 TABLET BY MOUTH TWICE A DAY  60 tablet  11   No current facility-administered medications on file prior to visit.    Allergies  Allergen Reactions  . Duloxetine     REACTION: N/V Mental status change and trouble with balance  . Omeprazole     REACTION: Didn't work  . Pregabalin     REACTION:  SOB, swollen lips    Past Medical History  Diagnosis Date  . Breast cancer     Bilateral mastectomies 1986.  [name] GERD (gastroesophageal reflux disease)   . Hyperlipidemia   . Arthritis   . Depression   . Zoster      with Post-herpetic neuralgia  . History of partial thyroidectomy   . Dyspnea     Echo (5/11) was a difficult study due to breast implants but showed normal LV and RV size and systolic function.      [name] Atypical chest pain     Lexiscan myoview (5/11) with EF 84%, normal wall motion, small fixed apical  perfusion defect likely due to breast attenuation, no evidence for ischemia or infarction.  **Patient had an  . CVA (cerebral infarction) 10/12    right lacunar  . Hypertension   . Obstruction of intestine   . Partial small bowel obstruction 7/14    no surgery    Past Surgical History  Procedure Laterality Date  . Abdominal hysterectomy    . Mastectomy  1980    bilateral  . Vaginal delivery      No family history on file.  History   Social  History  . Marital Status: Widowed    Spouse Name: N/A    Number of Children: 2  . Years of Education: N/A   Occupational History  . retired Public librarian    Social History Main Topics  . Smoking status: Never Smoker   . Smokeless tobacco: Never Used  . Alcohol Use: No  . Drug Use: No  . Sexual Activity: Not on file   Other Topics Concern  . Not on file   Social History Narrative   No living will   No health care POA but requests daughter [name] to do this   Would like attempts at resuscitation   No feeding tube if cognitively unaware   Review of Systems Sleeps okay Weight is stable Can't see out of right eye still ---"works on your nerves" Has had some afternoon ankle swelling    Objective:   Physical Exam  Constitutional: She appears well-developed and well-nourished. No distress.  Neck: Normal range of motion. Neck supple. No thyromegaly present.  Cardiovascular: Normal rate, regular rhythm and normal  heart sounds.  Exam reveals no gallop.   No murmur heard. Pulmonary/Chest: Effort normal and breath sounds normal. No respiratory distress. She has no wheezes. She has no rales.  Abdominal: Soft. There is no tenderness.  Musculoskeletal: She exhibits no edema.  Lymphadenopathy:    She has no cervical adenopathy.  Psychiatric: She has a normal mood and affect. Her behavior is normal.  Frustrated but no overt depression          Assessment & Plan:

## 2013-04-23 NOTE — Assessment & Plan Note (Signed)
Chronic pain Hasn't done better with higher narcotic doses

## 2013-04-23 NOTE — Assessment & Plan Note (Signed)
On statin due to past CVA Will check labs

## 2013-04-23 NOTE — Assessment & Plan Note (Signed)
Related to chronic pain, limited eyesight, etc Has failed multiple meds Discussed faith, etc Consider meditation

## 2013-04-24 LAB — CBC WITH DIFFERENTIAL/PLATELET
Basophils Absolute: 0.1 10*3/uL (ref 0.0–0.1)
Basophils Relative: 0.7 % (ref 0.0–3.0)
Eosinophils Absolute: 0.1 10*3/uL (ref 0.0–0.7)
Hemoglobin: 11.9 g/dL — ABNORMAL LOW (ref 12.0–15.0)
Lymphocytes Relative: 36.9 % (ref 12.0–46.0)
MCHC: 33.6 g/dL (ref 30.0–36.0)
MCV: 101.8 fl — ABNORMAL HIGH (ref 78.0–100.0)
Monocytes Absolute: 0.7 10*3/uL (ref 0.1–1.0)
Neutro Abs: 3.7 10*3/uL (ref 1.4–7.7)
Neutrophils Relative %: 51.4 % (ref 43.0–77.0)
RBC: 3.49 Mil/uL — ABNORMAL LOW (ref 3.87–5.11)
RDW: 13 % (ref 11.5–14.6)

## 2013-04-24 LAB — BASIC METABOLIC PANEL
CO2: 26 mEq/L (ref 19–32)
Calcium: 8.9 mg/dL (ref 8.4–10.5)
Chloride: 107 mEq/L (ref 96–112)
Creatinine, Ser: 1 mg/dL (ref 0.4–1.2)
Glucose, Bld: 95 mg/dL (ref 70–99)

## 2013-04-24 LAB — HEPATIC FUNCTION PANEL
ALT: 16 U/L (ref 0–35)
Total Protein: 7.2 g/dL (ref 6.0–8.3)

## 2013-04-24 LAB — LIPID PANEL
Cholesterol: 160 mg/dL (ref 0–200)
HDL: 75.8 mg/dL (ref >39.00)
Total CHOL/HDL Ratio: 2
Triglycerides: 70 mg/dL (ref 0.0–149.0)

## 2013-04-27 ENCOUNTER — Encounter: Payer: Self-pay | Admitting: *Deleted

## 2013-05-04 ENCOUNTER — Other Ambulatory Visit: Payer: Self-pay | Admitting: Internal Medicine

## 2013-05-04 NOTE — Telephone Encounter (Signed)
Lorazepam last filled 04/02/2013

## 2013-05-04 NOTE — Telephone Encounter (Signed)
rx called into pharmacy

## 2013-05-04 NOTE — Telephone Encounter (Signed)
Okay zovirax for a year  Lorazepam okay #30 x 0

## 2013-05-11 ENCOUNTER — Other Ambulatory Visit: Payer: Self-pay

## 2013-05-11 MED ORDER — MORPHINE SULFATE 15 MG PO TABS: 15.0000 mg | ORAL_TABLET | Freq: Three times a day (TID) | ORAL | Status: DC

## 2013-05-11 NOTE — Telephone Encounter (Signed)
Spoke with patient and advised rx ready for pick-up and it will be at the front desk.  

## 2013-05-11 NOTE — Telephone Encounter (Signed)
Pt left v/m requesting rx morphine; pt almost out of med. Call when ready for pick up.

## 2013-06-02 ENCOUNTER — Other Ambulatory Visit: Payer: Self-pay | Admitting: Internal Medicine

## 2013-06-02 NOTE — Telephone Encounter (Signed)
Okay #30 x 0 

## 2013-06-02 NOTE — Telephone Encounter (Signed)
rx called into pharmacy

## 2013-06-02 NOTE — Telephone Encounter (Signed)
Last filled 05/04/13

## 2013-06-15 ENCOUNTER — Other Ambulatory Visit: Payer: Self-pay | Admitting: *Deleted

## 2013-06-15 MED ORDER — MORPHINE SULFATE 15 MG PO TABS: 15.0000 mg | ORAL_TABLET | Freq: Three times a day (TID) | ORAL | Status: DC

## 2013-06-15 NOTE — Telephone Encounter (Signed)
Pt request a phone call when rx is ready.

## 2013-06-15 NOTE — Telephone Encounter (Signed)
Patient notified that script is up front and ready for pickup. 

## 2013-06-25 ENCOUNTER — Ambulatory Visit: Payer: Medicare Other | Admitting: General Surgery

## 2013-06-29 ENCOUNTER — Other Ambulatory Visit: Payer: Self-pay | Admitting: Internal Medicine

## 2013-06-29 NOTE — Telephone Encounter (Signed)
Okay #30 x 0 

## 2013-06-29 NOTE — Telephone Encounter (Signed)
Last filled 06/02/13 

## 2013-06-30 NOTE — Telephone Encounter (Signed)
rx called into pharmacy

## 2013-07-07 ENCOUNTER — Ambulatory Visit (INDEPENDENT_AMBULATORY_CARE_PROVIDER_SITE_OTHER): Payer: Medicare Other | Admitting: General Surgery

## 2013-07-07 ENCOUNTER — Other Ambulatory Visit: Payer: Medicare Other

## 2013-07-07 DIAGNOSIS — I6529 Occlusion and stenosis of unspecified carotid artery: Secondary | ICD-10-CM

## 2013-07-07 NOTE — Progress Notes (Signed)
This is a 79 year old female here today for a carotid ultrasound. Ultrasound shows no changes from a year ago   Bilateral carotid Doppler duplex evaluation was performed. Mild intimal thickening is noted along the common carotid arteries bilaterally. There is minimal plaquing identified and the right carotid bifurcation without any apparent stenosis. ICA/CCA ratio is 1.29 on the right and 0.83 on the left. Both vertebral arteries have antegrade flow. Impression: Stable finding from a year ago. No hemodynamically significant stenosis identified.

## 2013-07-14 ENCOUNTER — Encounter: Payer: Self-pay | Admitting: General Surgery

## 2013-07-16 ENCOUNTER — Other Ambulatory Visit: Payer: Self-pay

## 2013-07-16 ENCOUNTER — Other Ambulatory Visit: Payer: Self-pay | Admitting: Internal Medicine

## 2013-07-16 MED ORDER — MORPHINE SULFATE 15 MG PO TABS: 15.0000 mg | ORAL_TABLET | Freq: Three times a day (TID) | ORAL | Status: DC

## 2013-07-16 NOTE — Telephone Encounter (Signed)
Pt left v/m requesting rx morphine; pt is almost out of med and request cb when rx ready for pick up.

## 2013-07-16 NOTE — Telephone Encounter (Signed)
Spoke with patient and advised rx ready for pick-up and it will be at the front desk.  

## 2013-07-24 ENCOUNTER — Other Ambulatory Visit: Payer: Self-pay | Admitting: Internal Medicine

## 2013-07-30 ENCOUNTER — Other Ambulatory Visit: Payer: Self-pay | Admitting: Internal Medicine

## 2013-07-30 NOTE — Telephone Encounter (Signed)
Last filled 06/29/13

## 2013-07-31 NOTE — Telephone Encounter (Signed)
rx called into pharmacy

## 2013-07-31 NOTE — Telephone Encounter (Signed)
Okay #30 x 0 

## 2013-08-18 ENCOUNTER — Other Ambulatory Visit: Payer: Self-pay | Admitting: Internal Medicine

## 2013-08-18 NOTE — Telephone Encounter (Signed)
Refilled

## 2013-08-18 NOTE — Telephone Encounter (Signed)
Pt daughter walked in asking for refill, per daughter she called the pharmacy yesterday and they said it was sent, I showed the daughter it came over today at 1:39pm after Dr. Silvio Pate was gone. I spoke with Webb Silversmith and she will sign the rx for Dr. Silvio Pate.

## 2013-08-18 NOTE — Telephone Encounter (Signed)
Pt left v/m requesting rx morphine. Pt is out of med. Call when ready for pick up.

## 2013-08-24 ENCOUNTER — Ambulatory Visit: Payer: Medicare Other | Admitting: Internal Medicine

## 2013-08-31 ENCOUNTER — Other Ambulatory Visit: Payer: Self-pay | Admitting: Internal Medicine

## 2013-08-31 NOTE — Telephone Encounter (Signed)
07/30/13 

## 2013-08-31 NOTE — Telephone Encounter (Signed)
Okay #30 x 0 

## 2013-08-31 NOTE — Telephone Encounter (Signed)
rx called into pharmacy

## 2013-09-10 ENCOUNTER — Ambulatory Visit: Payer: Medicare Other | Admitting: Internal Medicine

## 2013-09-14 ENCOUNTER — Encounter: Payer: Self-pay | Admitting: Internal Medicine

## 2013-09-14 ENCOUNTER — Ambulatory Visit (INDEPENDENT_AMBULATORY_CARE_PROVIDER_SITE_OTHER): Payer: Commercial Managed Care - HMO | Admitting: Internal Medicine

## 2013-09-14 VITALS — BP 110/80 | HR 80 | Temp 98.1°F | Wt 87.0 lb

## 2013-09-14 DIAGNOSIS — F3289 Other specified depressive episodes: Secondary | ICD-10-CM

## 2013-09-14 DIAGNOSIS — F329 Major depressive disorder, single episode, unspecified: Secondary | ICD-10-CM

## 2013-09-14 DIAGNOSIS — I6990 Unspecified sequelae of unspecified cerebrovascular disease: Secondary | ICD-10-CM

## 2013-09-14 DIAGNOSIS — I10 Essential (primary) hypertension: Secondary | ICD-10-CM

## 2013-09-14 DIAGNOSIS — B029 Zoster without complications: Secondary | ICD-10-CM

## 2013-09-14 MED ORDER — SIMVASTATIN 20 MG PO TABS: 20.0000 mg | ORAL_TABLET | Freq: Every day | ORAL | Status: DC

## 2013-09-14 MED ORDER — PANTOPRAZOLE SODIUM 40 MG PO TBEC: 40.0000 mg | DELAYED_RELEASE_TABLET | Freq: Two times a day (BID) | ORAL | Status: DC

## 2013-09-14 MED ORDER — MORPHINE SULFATE 15 MG PO TABS: 15.0000 mg | ORAL_TABLET | Freq: Three times a day (TID) | ORAL | Status: DC

## 2013-09-14 NOTE — Assessment & Plan Note (Signed)
Stable status.

## 2013-09-14 NOTE — Progress Notes (Signed)
Pre visit review using our clinic review tool, if applicable. No additional management support is needed unless otherwise documented below in the visit note. 

## 2013-09-14 NOTE — Assessment & Plan Note (Signed)
BP Readings from Last 3 Encounters:  09/14/13 110/80  07/07/13 114/62  04/23/13 128/80   Good control

## 2013-09-14 NOTE — Assessment & Plan Note (Addendum)
Still with outbreaks and pain Will continue the topimax but try 1/2 tab instead and see if it helps appetite without increase in pain

## 2013-09-14 NOTE — Patient Instructions (Signed)
Please cut the topiramate in half and give just 50mg  at bedtime. If the pain gets worse, increase back to a full tablet. If there is no change in the pain, try off it completely (to see if this helps your appetite).

## 2013-09-14 NOTE — Assessment & Plan Note (Signed)
Related to the chronic pain

## 2013-09-14 NOTE — Progress Notes (Signed)
Subjective:    Patient ID: [name], female    DOB: 06-28-1928, 80 y.o.   MRN: [id number]  HPI Here with daughter Daughter has been "pumping food in her left and right" Getting dental work---waiting for new top dentures Still has ensure  Right head pain is still there Morphine helps a little--but not that much Had more lesions break out---hot, itchy then the rash comes  Still depressed Still wonders about "why am I living with this mess" Some degree of anhedonia Has failed multiple meds  Some hard bowels but no obstruction Gets some pain by bottom  Current Outpatient Prescriptions on File Prior to Visit  Medication Sig Dispense Refill  . acyclovir (ZOVIRAX) 800 MG tablet TAKE 1 TABLET BY MOUTH 4 TIMES A DAY  120 tablet  11  . aspirin 81 MG tablet Take 81 mg by mouth daily.        [name] LORazepam (ATIVAN) 1 MG tablet TAKE 1/2 TO 1 TABLET BY MOUTH EVERY NIGHT AT BEDTIME  30 tablet  0  . metoprolol succinate (TOPROL-XL) 25 MG 24 hr tablet TAKE 1/2 TABLET BY MOUTH ONCE A DAY  45 tablet  3  . morphine (MSIR) 15 MG tablet TAKE 1 TABLET BY MOUTH 3 TIMES DAILY  90 tablet  0  . Multiple Vitamin (MULTIVITAMIN) tablet Take 1 tablet by mouth daily.      . prednisoLONE acetate (PRED FORTE) 1 % ophthalmic suspension       . topiramate (TOPAMAX) 100 MG tablet TAKE 1 TABLET BY MOUTH TWICE A DAY  60 tablet  11   No current facility-administered medications on file prior to visit.    Allergies  Allergen Reactions  . Duloxetine     REACTION: N/V Mental status change and trouble with balance  . Omeprazole     REACTION: Didn't work  . Pregabalin     REACTION: SOB, swollen lips    Past Medical History  Diagnosis Date  . Breast cancer     Bilateral mastectomies 1986.  [name] GERD (gastroesophageal reflux disease)   . Hyperlipidemia   . Arthritis   . Depression   . Zoster      with Post-herpetic neuralgia  . History of partial thyroidectomy   . Dyspnea     Echo (5/11) was a  difficult study due to breast implants but showed normal LV and RV size and systolic function.      [name] Atypical chest pain     Lexiscan myoview (5/11) with EF 84%, normal wall motion, small fixed apical  perfusion defect likely due to breast attenuation, no evidence for ischemia or infarction.  **Patient had an  . CVA (cerebral infarction) 10/12    right lacunar  . Hypertension   . Obstruction of intestine   . Partial small bowel obstruction 7/14    no surgery    Past Surgical History  Procedure Laterality Date  . Abdominal hysterectomy    . Mastectomy  1980    bilateral  . Vaginal delivery      No family history on file.  History   Social History  . Marital Status: Widowed    Spouse Name: N/A    Number of Children: 2  . 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 Smoking status: Never Smoker   . Smokeless tobacco: Never Used  . Alcohol Use: No  . Drug Use: No  .  Sexual Activity: Not on file   Other Topics Concern  . Not on file   Social History Narrative   No living will   No health care POA but requests daughter [name] to do this   Would like attempts at resuscitation   No feeding tube if cognitively unaware   Review of Systems Weight is down 5#---she didn't realize Sleeps okay Still can't see out of the right eye--some burning and watering    Objective:   Physical Exam  Constitutional: She appears well-developed. No distress.  HENT:  No active zoster lesions seen now  Neck: Normal range of motion. Neck supple.  Cardiovascular: Normal rate, regular rhythm and normal heart sounds.  Exam reveals no gallop.   No murmur heard. Pulmonary/Chest: Effort normal and breath sounds normal. No respiratory distress. She has no wheezes. She has no rales.  Abdominal: Soft. There is no tenderness.  Musculoskeletal: She exhibits no edema.  Lymphadenopathy:    She has no cervical adenopathy.  Skin:  Slight  scaly area on lateral right ankle--good circulation there though  Psychiatric: Her behavior is normal.          Assessment & Plan:

## 2013-09-14 NOTE — Addendum Note (Signed)
Addended by: Despina Hidden on: 09/14/2013 05:00 PM   Modules accepted: Orders

## 2013-09-15 ENCOUNTER — Telehealth: Payer: Self-pay | Admitting: Internal Medicine

## 2013-09-15 NOTE — Telephone Encounter (Signed)
Relevant patient education assigned to patient using Emmi. ° °

## 2013-09-27 ENCOUNTER — Encounter: Payer: Self-pay | Admitting: Internal Medicine

## 2013-09-28 ENCOUNTER — Other Ambulatory Visit: Payer: Self-pay | Admitting: Internal Medicine

## 2013-09-29 NOTE — Telephone Encounter (Signed)
rx called into pharmacy

## 2013-09-29 NOTE — Telephone Encounter (Signed)
08/31/2013 

## 2013-09-29 NOTE — Telephone Encounter (Signed)
Okay #30 x 0 

## 2013-10-19 ENCOUNTER — Other Ambulatory Visit: Payer: Self-pay | Admitting: Internal Medicine

## 2013-10-20 NOTE — Telephone Encounter (Signed)
Spoke with patient and advised rx ready for pick-up and it will be at the front desk.  

## 2013-10-28 ENCOUNTER — Other Ambulatory Visit: Payer: Self-pay | Admitting: Internal Medicine

## 2013-10-28 NOTE — Telephone Encounter (Signed)
09/29/2013 

## 2013-10-29 NOTE — Telephone Encounter (Signed)
Okay #30 x 0 

## 2013-10-29 NOTE — Telephone Encounter (Signed)
rx called into pharmacy

## 2013-11-21 ENCOUNTER — Other Ambulatory Visit: Payer: Self-pay | Admitting: Internal Medicine

## 2013-11-23 NOTE — Telephone Encounter (Signed)
Left message on daughters machine that rx is ready for pick-up, and it will be at our front desk.

## 2013-11-23 NOTE — Telephone Encounter (Signed)
10/20/13 

## 2013-12-01 ENCOUNTER — Other Ambulatory Visit: Payer: Self-pay | Admitting: Internal Medicine

## 2013-12-01 NOTE — Telephone Encounter (Signed)
Okay #30 x 0 

## 2013-12-01 NOTE — Telephone Encounter (Signed)
rx called into pharmacy

## 2013-12-01 NOTE — Telephone Encounter (Signed)
10/29/13 

## 2013-12-28 ENCOUNTER — Other Ambulatory Visit: Payer: Self-pay | Admitting: Internal Medicine

## 2013-12-28 NOTE — Telephone Encounter (Signed)
Spoke with patient and advised rx ready for pick-up and it will be at the front desk.  

## 2013-12-29 ENCOUNTER — Other Ambulatory Visit: Payer: Self-pay | Admitting: Internal Medicine

## 2013-12-29 NOTE — Telephone Encounter (Signed)
12/01/2012 

## 2013-12-30 NOTE — Telephone Encounter (Signed)
rx called into pharmacy

## 2013-12-30 NOTE — Telephone Encounter (Signed)
Okay #30 x 0 

## 2014-01-25 ENCOUNTER — Other Ambulatory Visit: Payer: Self-pay | Admitting: Internal Medicine

## 2014-01-25 NOTE — Telephone Encounter (Signed)
12/30/13 

## 2014-01-26 NOTE — Telephone Encounter (Signed)
Okay #30 x 0 

## 2014-01-26 NOTE — Telephone Encounter (Signed)
rx called into pharmacy

## 2014-02-04 ENCOUNTER — Other Ambulatory Visit: Payer: Self-pay | Admitting: Internal Medicine

## 2014-02-05 NOTE — Telephone Encounter (Signed)
Patient notified that Rx was ready and placed at front desk for pick up.

## 2014-02-05 NOTE — Telephone Encounter (Signed)
Ok to refill 

## 2014-02-22 ENCOUNTER — Encounter: Payer: Self-pay | Admitting: Internal Medicine

## 2014-02-22 ENCOUNTER — Ambulatory Visit (INDEPENDENT_AMBULATORY_CARE_PROVIDER_SITE_OTHER): Payer: Commercial Managed Care - HMO | Admitting: Internal Medicine

## 2014-02-22 VITALS — BP 130/80 | HR 60 | Temp 98.0°F | Ht 66.0 in | Wt 88.0 lb

## 2014-02-22 DIAGNOSIS — I6990 Unspecified sequelae of unspecified cerebrovascular disease: Secondary | ICD-10-CM

## 2014-02-22 DIAGNOSIS — B0229 Other postherpetic nervous system involvement: Secondary | ICD-10-CM

## 2014-02-22 DIAGNOSIS — I10 Essential (primary) hypertension: Secondary | ICD-10-CM

## 2014-02-22 DIAGNOSIS — E785 Hyperlipidemia, unspecified: Secondary | ICD-10-CM

## 2014-02-22 DIAGNOSIS — F39 Unspecified mood [affective] disorder: Secondary | ICD-10-CM

## 2014-02-22 DIAGNOSIS — D649 Anemia, unspecified: Secondary | ICD-10-CM | POA: Insufficient documentation

## 2014-02-22 DIAGNOSIS — D509 Iron deficiency anemia, unspecified: Secondary | ICD-10-CM | POA: Insufficient documentation

## 2014-02-22 DIAGNOSIS — D538 Other specified nutritional anemias: Secondary | ICD-10-CM

## 2014-02-22 DIAGNOSIS — E440 Moderate protein-calorie malnutrition: Secondary | ICD-10-CM

## 2014-02-22 DIAGNOSIS — Z23 Encounter for immunization: Secondary | ICD-10-CM

## 2014-02-22 DIAGNOSIS — Z7189 Other specified counseling: Secondary | ICD-10-CM | POA: Insufficient documentation

## 2014-02-22 DIAGNOSIS — Z0000 Encounter for general adult medical examination without abnormal findings: Secondary | ICD-10-CM

## 2014-02-22 NOTE — Assessment & Plan Note (Signed)
?  cognitive issues Mild weakness On asa--- will try off the statin due to overall condition

## 2014-02-22 NOTE — Assessment & Plan Note (Signed)
I have personally reviewed the Medicare Annual Wellness questionnaire and have noted 1. The patient's medical and social history 2. Their use of alcohol, tobacco or illicit drugs 3. Their current medications and supplements 4. The patient's functional ability including ADL's, fall risks, home safety risks and hearing or visual             impairment. 5. Diet and physical activities 6. Evidence for depression or mood disorders  The patients weight, height, BMI and visual acuity have been recorded in the chart I have made referrals, counseling and provided education to the patient based review of the above and I have provided the pt with a written personalized care plan for preventive services.  I have provided you with a copy of your personalized plan for preventive services. Please take the time to review along with your updated medication list.  Will give prevnar  No cancer screening due to age Flu shot yearly Has some clear cognitive issues---may be related to overall condition and narcotic need

## 2014-02-22 NOTE — Assessment & Plan Note (Signed)
Severe ongoing pain with some intermittent lesions On the acyclovir Will try to wean the topiramate in case it is affecting her appetite On morphine still also

## 2014-02-22 NOTE — Assessment & Plan Note (Signed)
Mild May be nutritional Will recheck labs

## 2014-02-22 NOTE — Addendum Note (Signed)
Addended by: Despina Hidden on: 02/22/2014 04:10 PM   Modules accepted: Orders

## 2014-02-22 NOTE — Assessment & Plan Note (Addendum)
BP Readings from Last 3 Encounters:  02/22/14 130/80  09/14/13 110/80  07/07/13 114/62   Good control Will try off the metoprolol

## 2014-02-22 NOTE — Assessment & Plan Note (Signed)
Despite boost Drinks whole milk also---add Carnation instant breakfast Discussed stopping some meds just in case

## 2014-02-22 NOTE — Patient Instructions (Addendum)
Please stop the simvastatin and the metoprolol. Please cut the topiramate in half (50mg ) for 1 week--then stop it. If the nerve pain from the shingles is much worse, you can go back to the prior dose.

## 2014-02-22 NOTE — Assessment & Plan Note (Signed)
Depression related to pain, sleep disturbance, anxiety at times Just the lorazepam now Consider mirtazapine if not better in a month

## 2014-02-22 NOTE — Assessment & Plan Note (Signed)
Lab Results  Component Value Date   LDLCALC 70 04/23/2013   Will stop the simvastatin given weight loss and overall condition

## 2014-02-22 NOTE — Progress Notes (Signed)
Subjective:    Patient ID: [name], female    DOB: Jun 18, 1928, 80 y.o.   MRN: [id number]  HPI Here with daughter in law For Medicare wellness and follow up Reviewed form and advanced directives Only other doctor is Dr [name] just retired. Getting new doctor for October visit Some hearing problems---ears feel full No tobacco or alcohol Mild memory problems No exercise Doesn't drive, needs help now managing money. Doesn't cook. DIL brings food Needs help with other instrumental ADLs Mild memory issues---forgets and repeats things Did have 1 fall with scraping her right arm  Ongoing shingles pain Still has some breaking out in hairline on right and still has extremely sensitive head Used OTC antibiotic cream with anaesthetic--some help No vision in right eye Takes the morphine bid usually-- hasn't been taking the oxycodone for a while Uses the topiramate at bedtime--hard to tell if this is helping  No chest pain Some dizziness when she bends over and gets back up--some spinning Does have some DOE---seems stable  Reflux is controlled with PPI --rare heartburn Some trouble swallowing though--gets choked easily (meat). Tries to cut things small and stick with soft foods Some ongoing constipation-- has just started trying [place] (didn't really help) miralax helped though-- did discuss using this regularly  Depressed at times-- limited due to not driving Uses the lorazepam mostly at bedtime for sleep  Current Outpatient Prescriptions on File Prior to Visit  Medication Sig Dispense Refill  . acyclovir (ZOVIRAX) 800 MG tablet TAKE 1 TABLET BY MOUTH 4 TIMES A DAY  120 tablet  11  . aspirin 81 MG tablet Take 81 mg by mouth daily.        [name] LORazepam (ATIVAN) 1 MG tablet TAKE 1/2 TO 1 TABLET BY MOUTH AT BEDTIME  30 tablet  0  . metoprolol succinate (TOPROL-XL) 25 MG 24 hr tablet TAKE 1/2 TABLET BY MOUTH ONCE A DAY  45 tablet  3  . morphine (MSIR) 15 MG tablet TAKE 1  TABLET 3 TIMES A DAY  90 tablet  0  . Multiple Vitamin (MULTIVITAMIN) tablet Take 1 tablet by mouth daily.      . pantoprazole (PROTONIX) 40 MG tablet Take 1 tablet (40 mg total) by mouth 2 (two) times daily.  60 tablet  11  . prednisoLONE acetate (PRED FORTE) 1 % ophthalmic suspension       . simvastatin (ZOCOR) 20 MG tablet Take 1 tablet (20 mg total) by mouth at bedtime.  30 tablet  11  . topiramate (TOPAMAX) 100 MG tablet Take 1/2 tab at bedtime       No current facility-administered medications on file prior to visit.    Allergies  Allergen Reactions  . Duloxetine     REACTION: N/V Mental status change and trouble with balance  . Omeprazole     REACTION: Didn't work  . Pregabalin     REACTION: SOB, swollen lips    Past Medical History  Diagnosis Date  . Breast cancer     Bilateral mastectomies 1986.  [name] GERD (gastroesophageal reflux disease)   . Hyperlipidemia   . Arthritis   . Depression   . Zoster      with Post-herpetic neuralgia  . History of partial thyroidectomy   . Dyspnea     Echo (5/11) was a difficult study due to breast implants but showed normal LV and RV size and systolic function.      [name] Atypical chest pain     Lexiscan  myoview (5/11) with EF 84%, normal wall motion, small fixed apical  perfusion defect likely due to breast attenuation, no evidence for ischemia or infarction.  **Patient had an  . CVA (cerebral infarction) 10/12    right lacunar  . Hypertension   . Obstruction of intestine   . Partial small bowel obstruction 7/14    no surgery    Past Surgical History  Procedure Laterality Date  . Abdominal hysterectomy    . Mastectomy  1980    bilateral  . Vaginal delivery      No family history on file.  History   Social History  . Marital Status: Widowed    Spouse Name: N/A    Number of Children: 2  . 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 Smoking status: Never Smoker    . Smokeless tobacco: Never Used  . Alcohol Use: No  . Drug Use: No  . Sexual Activity: Not on file   Other Topics Concern  . Not on file   Social History Narrative   No living will   No health care POA but requests daughter-in-law [name] to do this   Would like attempts at resuscitation   No feeding tube if cognitively unaware   Review of Systems Gets running in left eye Throat is dry in the morning Not clear how well she is eating---weight has been decreasing over time still Sleeps okay-- voids 2-3 times New dentures --- not really happy with them at this point Having some itching--especially at night    Objective:   Physical Exam  Constitutional: She is oriented to person, place, and time. She appears well-developed and well-nourished. No distress.  HENT:  Mouth/Throat: Oropharynx is clear and moist. No oropharyngeal exudate.  No zoster lesions now but hypersensitive on right head  Neck: Normal range of motion. Neck supple. No thyromegaly present.  Cardiovascular: Normal rate, regular rhythm and normal heart sounds.  Exam reveals no gallop.   No murmur heard. Faint pedal pulses  Pulmonary/Chest: Effort normal and breath sounds normal. No respiratory distress. She has no wheezes. She has no rales.  Abdominal: Soft. There is no tenderness.  Musculoskeletal: She exhibits no edema and no tenderness.  Lymphadenopathy:    She has no cervical adenopathy.  Neurological: She is alert and oriented to person, place, and time.  President-- "[name], [name]" 100-? [id number] (some trouble) Recall 1/3  Skin: No rash noted. No erythema.  Slight sore area on right lateral malleolus  Psychiatric:  Some depressed mood and psychomotor retardation Normal speech and appearance          Assessment & Plan:

## 2014-02-23 LAB — CBC WITH DIFFERENTIAL/PLATELET
BASOS ABS: 0 10*3/uL (ref 0.0–0.1)
Basophils Relative: 0.4 % (ref 0.0–3.0)
EOS ABS: 0.1 10*3/uL (ref 0.0–0.7)
Eosinophils Relative: 0.8 % (ref 0.0–5.0)
HEMATOCRIT: 35.3 % — AB (ref 36.0–46.0)
HEMOGLOBIN: 11.5 g/dL — AB (ref 12.0–15.0)
Lymphocytes Relative: 26 % (ref 12.0–46.0)
Lymphs Abs: 1.9 10*3/uL (ref 0.7–4.0)
MCHC: 32.5 g/dL (ref 30.0–36.0)
MCV: 102.5 fl — ABNORMAL HIGH (ref 78.0–100.0)
Monocytes Absolute: 0.6 10*3/uL (ref 0.1–1.0)
Monocytes Relative: 8.5 % (ref 3.0–12.0)
Neutro Abs: 4.6 10*3/uL (ref 1.4–7.7)
Neutrophils Relative %: 64.3 % (ref 43.0–77.0)
Platelets: 118 10*3/uL — ABNORMAL LOW (ref 150.0–400.0)
RBC: 3.45 Mil/uL — ABNORMAL LOW (ref 3.87–5.11)
RDW: 13.6 % (ref 11.5–15.5)
WBC: 7.2 10*3/uL (ref 4.0–10.5)

## 2014-02-23 LAB — COMPREHENSIVE METABOLIC PANEL
ALT: 15 U/L (ref 0–35)
AST: 20 U/L (ref 0–37)
Albumin: 3.9 g/dL (ref 3.5–5.2)
Alkaline Phosphatase: 46 U/L (ref 39–117)
BILIRUBIN TOTAL: 0.3 mg/dL (ref 0.2–1.2)
BUN: 26 mg/dL — ABNORMAL HIGH (ref 6–23)
CHLORIDE: 106 meq/L (ref 96–112)
CO2: 29 mEq/L (ref 19–32)
CREATININE: 1 mg/dL (ref 0.4–1.2)
Calcium: 9 mg/dL (ref 8.4–10.5)
GFR: 59.48 mL/min — ABNORMAL LOW (ref >60.00)
Glucose, Bld: 88 mg/dL (ref 70–99)
Potassium: 4.7 mEq/L (ref 3.5–5.1)
Sodium: 140 mEq/L (ref 135–145)
Total Protein: 6.8 g/dL (ref 6.0–8.3)

## 2014-02-23 LAB — LIPID PANEL
Cholesterol: 142 mg/dL (ref 0–200)
HDL: 64.3 mg/dL (ref >39.00)
LDL Cholesterol: 50 mg/dL (ref 0–99)
NONHDL: 77.7
Total CHOL/HDL Ratio: 2
Triglycerides: 140 mg/dL (ref 0.0–149.0)
VLDL: 28 mg/dL (ref 0.0–40.0)

## 2014-02-23 LAB — T4, FREE: Free T4: 0.85 ng/dL (ref 0.60–1.60)

## 2014-02-24 ENCOUNTER — Other Ambulatory Visit: Payer: Self-pay | Admitting: Internal Medicine

## 2014-02-25 NOTE — Telephone Encounter (Signed)
Rx called in as prescribed 

## 2014-02-25 NOTE — Telephone Encounter (Signed)
01/26/14 

## 2014-02-25 NOTE — Telephone Encounter (Signed)
Okay #30 x 0 

## 2014-03-09 ENCOUNTER — Other Ambulatory Visit: Payer: Self-pay | Admitting: *Deleted

## 2014-03-09 MED ORDER — MORPHINE SULFATE 15 MG PO TABS: ORAL_TABLET | ORAL | Status: DC

## 2014-03-09 NOTE — Telephone Encounter (Signed)
Pharmacy sent request for pt. Pt request a call when hard copy is ready to be picked up.

## 2014-03-09 NOTE — Telephone Encounter (Signed)
Spoke with patient and advised rx ready for pick-up and it will be at the front desk.  

## 2014-03-22 ENCOUNTER — Ambulatory Visit (INDEPENDENT_AMBULATORY_CARE_PROVIDER_SITE_OTHER): Payer: Commercial Managed Care - HMO | Admitting: Internal Medicine

## 2014-03-22 ENCOUNTER — Encounter: Payer: Self-pay | Admitting: Internal Medicine

## 2014-03-22 VITALS — BP 128/70 | HR 74 | Temp 97.8°F | Wt 88.0 lb

## 2014-03-22 DIAGNOSIS — H01006 Unspecified blepharitis left eye, unspecified eyelid: Secondary | ICD-10-CM

## 2014-03-22 DIAGNOSIS — I10 Essential (primary) hypertension: Secondary | ICD-10-CM

## 2014-03-22 DIAGNOSIS — E440 Moderate protein-calorie malnutrition: Secondary | ICD-10-CM

## 2014-03-22 DIAGNOSIS — H01003 Unspecified blepharitis right eye, unspecified eyelid: Secondary | ICD-10-CM | POA: Insufficient documentation

## 2014-03-22 DIAGNOSIS — H01009 Unspecified blepharitis unspecified eye, unspecified eyelid: Secondary | ICD-10-CM

## 2014-03-22 NOTE — Progress Notes (Signed)
Subjective:    Patient ID: [name], female    DOB: 06-11-1928, 80 y.o.   MRN: [id number]  HPI Here with daughter  She feels that a lot of her eating problems are related to her teeth Is eating slightly more--daughter notices the difference  Continues to have the pain at right hair line still May be some worse with the decreased topiramate Still on the morphine--doesn't help as much  Current Outpatient Prescriptions on File Prior to Visit  Medication Sig Dispense Refill  . acyclovir (ZOVIRAX) 800 MG tablet TAKE 1 TABLET BY MOUTH 4 TIMES A DAY  120 tablet  11  . aspirin 81 MG tablet Take 81 mg by mouth daily.        [name] LORazepam (ATIVAN) 1 MG tablet TAKE 1/2 TO 1 TABLET BY MOUTH AT BEDTIME  30 tablet  0  . morphine (MSIR) 15 MG tablet TAKE 1 TABLET 3 TIMES A DAY  90 tablet  0  . Multiple Vitamin (MULTIVITAMIN) tablet Take 1 tablet by mouth daily.      . pantoprazole (PROTONIX) 40 MG tablet Take 1 tablet (40 mg total) by mouth 2 (two) times daily.  60 tablet  11  . prednisoLONE acetate (PRED FORTE) 1 % ophthalmic suspension       . topiramate (TOPAMAX) 100 MG tablet Take 1/2 tab at bedtime       No current facility-administered medications on file prior to visit.    Allergies  Allergen Reactions  . Duloxetine     REACTION: N/V Mental status change and trouble with balance  . Omeprazole     REACTION: Didn't work  . Pregabalin     REACTION: SOB, swollen lips    Past Medical History  Diagnosis Date  . Breast cancer     Bilateral mastectomies 1986.  [name] GERD (gastroesophageal reflux disease)   . Hyperlipidemia   . Arthritis   . Depression   . Zoster      with Post-herpetic neuralgia  . History of partial thyroidectomy   . Dyspnea     Echo (5/11) was a difficult study due to breast implants but showed normal LV and RV size and systolic function.      [name] Atypical chest pain     Lexiscan myoview (5/11) with EF 84%, normal wall motion, small fixed apical  perfusion  defect likely due to breast attenuation, no evidence for ischemia or infarction.  **Patient had an  . CVA (cerebral infarction) 10/12    right lacunar  . Hypertension   . Obstruction of intestine   . Partial small bowel obstruction 7/14    no surgery    Past Surgical History  Procedure Laterality Date  . Abdominal hysterectomy    . Mastectomy  1980    bilateral  . Vaginal delivery      No family history on file.  History   Social History  . Marital Status: Widowed    Spouse Name: N/A    Number of Children: 2  . 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 Smoking status: Never Smoker   . Smokeless tobacco: Never Used  . Alcohol Use: No  . Drug Use: No  . Sexual Activity: Not on file   Other Topics Concern  . Not on file   Social History Narrative   No living will   No health care POA but requests daughter-in-law [name]  to do this   Would like attempts at resuscitation   No feeding tube if cognitively unaware   Review of Systems Weight is stable Sleeping well No palpitations Notes DOE--no real recent change Eyes are running bad--- the eye drops haven't helped    Objective:   Physical Exam  Constitutional: No distress.  Eyes:  Conjunctiva not inflamed but lids are---going back to eye doctor  Cardiovascular: Normal rate, regular rhythm and normal heart sounds.  Exam reveals no gallop.   No murmur heard. Pulmonary/Chest: Effort normal and breath sounds normal. No respiratory distress. She has no wheezes. She has no rales.          Assessment & Plan:

## 2014-03-22 NOTE — Assessment & Plan Note (Signed)
Needs follow up with new eye doctor Asked her to try off the predforte for now

## 2014-03-22 NOTE — Assessment & Plan Note (Signed)
BP Readings from Last 3 Encounters:  03/22/14 128/70  02/22/14 130/80  09/14/13 110/80   Fine off the metoprolol

## 2014-03-22 NOTE — Assessment & Plan Note (Signed)
Appetite is better but weight unchanged Most likely from the decreased topiramate Will keep the 50mg  dose since she notices the difference with the PHN pain

## 2014-03-22 NOTE — Progress Notes (Signed)
Pre visit review using our clinic review tool, if applicable. No additional management support is needed unless otherwise documented below in the visit note. 

## 2014-03-22 NOTE — Patient Instructions (Signed)
Try off the pred forte drops. Keep your appointment with the new eye doctor.

## 2014-03-23 ENCOUNTER — Other Ambulatory Visit: Payer: Self-pay | Admitting: Internal Medicine

## 2014-03-23 NOTE — Telephone Encounter (Signed)
02/25/14 

## 2014-03-23 NOTE — Telephone Encounter (Signed)
rx called into pharmacy

## 2014-03-23 NOTE — Telephone Encounter (Signed)
Okay #30 x 0 

## 2014-04-13 ENCOUNTER — Other Ambulatory Visit: Payer: Self-pay | Admitting: Internal Medicine

## 2014-04-13 NOTE — Telephone Encounter (Signed)
Electronic Rx request for Morphine received. Last refill 03/09/14 and last office visit 03/22/14. Please advise

## 2014-04-14 NOTE — Telephone Encounter (Signed)
Patient notified Rx ready for pick-up. Patient verbalized understanding. Rx placed up front.

## 2014-04-23 ENCOUNTER — Other Ambulatory Visit: Payer: Self-pay | Admitting: Internal Medicine

## 2014-04-23 NOTE — Telephone Encounter (Signed)
Electronic Rx request for Ativan received. Patient's last office visit was 03/22/14 and medication last filled 03/23/14. Please advise.

## 2014-04-25 NOTE — Telephone Encounter (Signed)
Okay #30 x 0 

## 2014-04-26 NOTE — Telephone Encounter (Signed)
rx called into pharmacy

## 2014-04-29 ENCOUNTER — Telehealth: Payer: Self-pay

## 2014-04-29 NOTE — Telephone Encounter (Signed)
Pt got call about a flu shot and pt said she has already gotten flu shot and pneumonia shot this year.

## 2014-05-17 ENCOUNTER — Encounter: Payer: Self-pay | Admitting: Internal Medicine

## 2014-05-17 ENCOUNTER — Ambulatory Visit (INDEPENDENT_AMBULATORY_CARE_PROVIDER_SITE_OTHER): Payer: Commercial Managed Care - HMO | Admitting: Internal Medicine

## 2014-05-17 VITALS — BP 122/70 | HR 79 | Temp 97.7°F | Wt 89.0 lb

## 2014-05-17 DIAGNOSIS — H9203 Otalgia, bilateral: Secondary | ICD-10-CM

## 2014-05-17 DIAGNOSIS — H01003 Unspecified blepharitis right eye, unspecified eyelid: Secondary | ICD-10-CM

## 2014-05-17 DIAGNOSIS — H01006 Unspecified blepharitis left eye, unspecified eyelid: Secondary | ICD-10-CM

## 2014-05-17 DIAGNOSIS — R42 Dizziness and giddiness: Secondary | ICD-10-CM | POA: Insufficient documentation

## 2014-05-17 MED ORDER — MORPHINE SULFATE 15 MG PO TABS: 15.0000 mg | ORAL_TABLET | Freq: Three times a day (TID) | ORAL | Status: DC | PRN

## 2014-05-17 NOTE — Progress Notes (Signed)
Subjective:    Patient ID: [name], female    DOB: 06-11-1928, 80 y.o.   MRN: [id number]  HPI Here with daughter in law  Feels dizzy and having ear pain Feels ready to pass out---just sitting around Mostly upon standing up---things "turn black" and she has to hold on or sit down. Lasts a few seconds  Pain in throat Started last week Did find out she was allergic to her former eye drops Saw Dr Loren Racer replacement and she set her up with a specialist  No fever No cough Stays cold Gets skipped beat but not true vertigo  Current Outpatient Prescriptions on File Prior to Visit  Medication Sig Dispense Refill  . acyclovir (ZOVIRAX) 800 MG tablet TAKE 1 TABLET BY MOUTH 4 TIMES A DAY 120 tablet 11  . aspirin 81 MG tablet Take 81 mg by mouth daily.      [name] LORazepam (ATIVAN) 1 MG tablet TAKE 1/2 TO 1 TABLET BY MOUTH EVERY NIGHT AT BEDTIME 30 tablet 0  . morphine (MSIR) 15 MG tablet TAKE ONE TABLET BY MOUTH 3 TIMES DAILY 90 tablet 0  . Multiple Vitamin (MULTIVITAMIN) tablet Take 1 tablet by mouth daily.    . pantoprazole (PROTONIX) 40 MG tablet Take 1 tablet (40 mg total) by mouth 2 (two) times daily. 60 tablet 11  . topiramate (TOPAMAX) 100 MG tablet Take 1/2 tab at bedtime     No current facility-administered medications on file prior to visit.    Allergies  Allergen Reactions  . Duloxetine     REACTION: N/V Mental status change and trouble with balance  . Omeprazole     REACTION: Didn't work  . Pregabalin     REACTION: SOB, swollen lips    Past Medical History  Diagnosis Date  . Breast cancer     Bilateral mastectomies 1986.  [name] GERD (gastroesophageal reflux disease)   . Hyperlipidemia   . Arthritis   . Depression   . Zoster      with Post-herpetic neuralgia  . History of partial thyroidectomy   . Dyspnea     Echo (5/11) was a difficult study due to breast implants but showed normal LV and RV size and systolic function.      [name] Atypical chest pain    Lexiscan myoview (5/11) with EF 84%, normal wall motion, small fixed apical  perfusion defect likely due to breast attenuation, no evidence for ischemia or infarction.  **Patient had an  . CVA (cerebral infarction) 10/12    right lacunar  . Hypertension   . Obstruction of intestine   . Partial small bowel obstruction 7/14    no surgery    Past Surgical History  Procedure Laterality Date  . Abdominal hysterectomy    . Mastectomy  1980    bilateral  . Vaginal delivery      No family history on file.  History   Social History  . Marital Status: Widowed    Spouse Name: N/A    Number of Children: 2  . 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 Smoking status: Never Smoker   . Smokeless tobacco: Never Used  . Alcohol Use: No  . Drug Use: No  . Sexual Activity: Not on file   Other Topics Concern  . Not on file   Social History Narrative   No living will   No health care POA but  requests daughter-in-law [name] to do this   Would like attempts at resuscitation   No feeding tube if cognitively unaware    Review of Systems Eyes are running sticky water Having head pain still---but bilaterally now. Points to maxillary area    Objective:   Physical Exam  Constitutional: No distress.  HENT:  Mouth/Throat: Oropharynx is clear and moist. No oropharyngeal exudate.  No sinus tenderness TMs and canals are normal--but sensitive Mild pale nasal congestion  Eyes:  Periorbital injection and mild in conjunctiva Right exotropia  Neck: Normal range of motion. Neck supple. No thyromegaly present.  Cardiovascular: Normal rate, regular rhythm and normal heart sounds.  Exam reveals no gallop.   No murmur heard. Pulmonary/Chest: Effort normal and breath sounds normal. No respiratory distress. She has no wheezes. She has no rales.  Musculoskeletal: She exhibits no edema.  Lymphadenopathy:    She has no cervical  adenopathy.          Assessment & Plan:

## 2014-05-17 NOTE — Assessment & Plan Note (Signed)
Right side could be from shingles but not the left No problem with allergies Will just have to observe and see if improves or something changes

## 2014-05-17 NOTE — Assessment & Plan Note (Signed)
Not orthostatic 134/54 and pulse of 66 lying down,  128/60 and 72 standing No evidence of sinus infection and not allergies Doesn't seem cardiac Will just observe Discussed keeping up with fluids Eating about the same and taking boost 2-3 per day

## 2014-05-17 NOTE — Assessment & Plan Note (Signed)
Ongoing running and irritation Being seen by a specialist but not till December

## 2014-05-17 NOTE — Progress Notes (Signed)
Pre visit review using our clinic review tool, if applicable. No additional management support is needed unless otherwise documented below in the visit note. 

## 2014-05-20 ENCOUNTER — Other Ambulatory Visit: Payer: Self-pay | Admitting: Internal Medicine

## 2014-05-20 NOTE — Telephone Encounter (Signed)
04/26/14 

## 2014-05-21 NOTE — Telephone Encounter (Signed)
rx called into pharmacy

## 2014-05-21 NOTE — Telephone Encounter (Signed)
Okay #30 x 0 

## 2014-06-01 ENCOUNTER — Other Ambulatory Visit: Payer: Self-pay | Admitting: Internal Medicine

## 2014-06-14 ENCOUNTER — Other Ambulatory Visit: Payer: Self-pay | Admitting: Internal Medicine

## 2014-06-17 ENCOUNTER — Ambulatory Visit: Payer: Medicare Other

## 2014-06-17 ENCOUNTER — Ambulatory Visit (INDEPENDENT_AMBULATORY_CARE_PROVIDER_SITE_OTHER): Payer: Commercial Managed Care - HMO | Admitting: General Surgery

## 2014-06-17 ENCOUNTER — Encounter: Payer: Self-pay | Admitting: General Surgery

## 2014-06-17 VITALS — BP 124/70 | HR 80 | Resp 14 | Ht 67.0 in | Wt 90.0 lb

## 2014-06-17 DIAGNOSIS — I6529 Occlusion and stenosis of unspecified carotid artery: Secondary | ICD-10-CM

## 2014-06-17 NOTE — Patient Instructions (Addendum)
Patient to return in 1 year for follow up. The patient is aware to call back for any questions or concerns.  Patient advised to check with an ENT about her throat.

## 2014-06-17 NOTE — Progress Notes (Signed)
Patient ID: YALITZA TEED, female   DOB: Nov 11, 1927, 80 y.o.   MRN: [id number]  Chief Complaint  Patient presents with  . Follow-up    1 year followup carotid ultrasound    HPI [name] is a 80 y.o. female who present for a 1 year follow up carotid ultrasound.  She is still having pain from her episode of shingles. No symptoms suggestive of TIAs. She has recently noted some difficulty swallowing and feels there is some feeling of a lump in her upper neck area.  HPI  Past Medical History  Diagnosis Date  . Breast cancer     Bilateral mastectomies 1986.  [name] GERD (gastroesophageal reflux disease)   . Hyperlipidemia   . Arthritis   . Depression   . Zoster      with Post-herpetic neuralgia  . History of partial thyroidectomy   . Dyspnea     Echo (5/11) was a difficult study due to breast implants but showed normal LV and RV size and systolic function.      [name] Atypical chest pain     Lexiscan myoview (5/11) with EF 84%, normal wall motion, small fixed apical  perfusion defect likely due to breast attenuation, no evidence for ischemia or infarction.  **Patient had an  . CVA (cerebral infarction) 10/12    right lacunar  . Hypertension   . Obstruction of intestine   . Partial small bowel obstruction 7/14    no surgery    Past Surgical History  Procedure Laterality Date  . Abdominal hysterectomy    . Mastectomy  1980    bilateral  . Vaginal delivery      History reviewed. No pertinent family history.  Social History History  Substance Use Topics  . Smoking status: Never Smoker   . Smokeless tobacco: Never Used  . Alcohol Use: No    Allergies  Allergen Reactions  . Duloxetine     REACTION: N/V Mental status change and trouble with balance  . Omeprazole     REACTION: Didn't work  . Pregabalin     REACTION: SOB, swollen lips    Current Outpatient Prescriptions  Medication Sig Dispense Refill  . acyclovir (ZOVIRAX) 800 MG tablet TAKE 1 TABLET BY MOUTH 4 TIMES  A DAY 120 tablet 0  . aspirin 81 MG tablet Take 81 mg by mouth daily.      [name] LORazepam (ATIVAN) 1 MG tablet TAKE 1/2 TO 1 TABLET BY MOUTH EVERY NIGHT AT BEDTIME 30 tablet 0  . morphine (MSIR) 15 MG tablet Take 1 tablet (15 mg total) by mouth 3 (three) times daily as needed for severe pain. 90 tablet 0  . Multiple Vitamin (MULTIVITAMIN) tablet Take 1 tablet by mouth daily.    . pantoprazole (PROTONIX) 40 MG tablet Take 1 tablet (40 mg total) by mouth 2 (two) times daily. 60 tablet 11  . topiramate (TOPAMAX) 100 MG tablet TAKE 1 TABLET BY MOUTH TWICE A DAY 60 tablet 11   No current facility-administered medications for this visit.    Review of Systems Review of Systems  Constitutional: Negative.   Respiratory: Negative.   Cardiovascular: Negative.     Blood pressure 124/70, pulse 80, resp. rate 14, height 5\' 7"  (1.702 m), weight 90 lb (40.824 kg).  Physical Exam Physical Exam Neck exam is unremarkable. Healed incision right side of neck from prior CEA. No lymphadenopathy Data Reviewed Prior carotid doppler  Carotid doppler today-stable.   Assessment  Stable mild carotid artery disease. Swallowing difficulty- suggested she discuss this with her PCP. May need ENT eval.    Plan    1 yr f/u carotid doppler     Carotid Duplex study. Both carotids show scattered mild plaquing and intimal prominence, There is focal mild stenosis of right ICA origin but appears stable from prior studies. ICA/CCA ratio is 0.69 on right and 0.47 on left. Both vertebrals have antegrade flow. Stable mild carotid artery plaquing    SANKAR,SEEPLAPUTHUR G 06/18/2014, 8:52 AM

## 2014-06-18 ENCOUNTER — Encounter: Payer: Self-pay | Admitting: General Surgery

## 2014-06-18 ENCOUNTER — Telehealth: Payer: Self-pay | Admitting: *Deleted

## 2014-06-18 NOTE — Telephone Encounter (Signed)
That sounds fine

## 2014-06-18 NOTE — Telephone Encounter (Signed)
Spoke with patient and she would like to wait until the beginning of the year to discuss with you at her appt in January. She did say she would call back if things got worse.

## 2014-06-18 NOTE — Telephone Encounter (Signed)
-----   Message from Venia Carbon, MD sent at 06/18/2014  1:27 PM EST ----- Please see if she would like a referral to an ENT  ----- Message -----    From: Christene Lye, MD    Sent: 06/18/2014   9:04 AM      To: Venia Carbon, MD  Hi Richard, please see my note form 06/17/14 on Ms. [name]. She is having difficulty swallowing with sensation of something in her neck. I do not feel anything in her neck. May benefit with ENT eval. Thanks., Sank.

## 2014-06-21 ENCOUNTER — Other Ambulatory Visit: Payer: Self-pay | Admitting: Internal Medicine

## 2014-06-21 NOTE — Telephone Encounter (Signed)
Lorazepam 11/13 Morphine 11/9

## 2014-06-21 NOTE — Telephone Encounter (Signed)
Spoke with patient and advised rx ready for pick-up and it will be at the front desk.  

## 2014-07-07 ENCOUNTER — Ambulatory Visit: Payer: Self-pay | Admitting: General Surgery

## 2014-07-14 ENCOUNTER — Other Ambulatory Visit: Payer: Self-pay | Admitting: Internal Medicine

## 2014-07-20 ENCOUNTER — Other Ambulatory Visit: Payer: Self-pay | Admitting: Internal Medicine

## 2014-07-20 NOTE — Telephone Encounter (Signed)
06/21/14 

## 2014-07-21 NOTE — Telephone Encounter (Signed)
Please phone in the lorazepam

## 2014-07-21 NOTE — Telephone Encounter (Signed)
rx called into pharmacy Spoke with patient and advised rx ready for pick-up and it will be at the front desk.

## 2014-07-29 ENCOUNTER — Encounter: Payer: Self-pay | Admitting: Internal Medicine

## 2014-07-29 ENCOUNTER — Ambulatory Visit (INDEPENDENT_AMBULATORY_CARE_PROVIDER_SITE_OTHER): Payer: Commercial Managed Care - HMO | Admitting: Internal Medicine

## 2014-07-29 VITALS — BP 124/78 | HR 80 | Temp 98.1°F | Wt 89.4 lb

## 2014-07-29 DIAGNOSIS — E440 Moderate protein-calorie malnutrition: Secondary | ICD-10-CM

## 2014-07-29 DIAGNOSIS — Z23 Encounter for immunization: Secondary | ICD-10-CM

## 2014-07-29 DIAGNOSIS — F39 Unspecified mood [affective] disorder: Secondary | ICD-10-CM

## 2014-07-29 DIAGNOSIS — R42 Dizziness and giddiness: Secondary | ICD-10-CM

## 2014-07-29 DIAGNOSIS — B0229 Other postherpetic nervous system involvement: Secondary | ICD-10-CM

## 2014-07-29 NOTE — Assessment & Plan Note (Signed)
Not really depressed Dysthymia with pain Anxiety at times Has the lorazepam

## 2014-07-29 NOTE — Progress Notes (Signed)
Pre visit review using our clinic review tool, if applicable. No additional management support is needed unless otherwise documented below in the visit note. 

## 2014-07-29 NOTE — Assessment & Plan Note (Signed)
Chronic problems On the morphine Not sure the topiramate is helping---will try off it

## 2014-07-29 NOTE — Addendum Note (Signed)
Addended by: Jacqualin Combes on: 07/29/2014 04:59 PM   Modules accepted: Orders

## 2014-07-29 NOTE — Progress Notes (Signed)
Subjective:    Patient ID: [name], female    DOB: [birth year], 81 y.o.   MRN: [id number]  HPI Here with daughter  Concerned she may be "taking a cold" Having eye surgery 2/25--to open tear duct (hopefully will improve all the tearing)  Still has rash across right V1 dermatome Still having terrible pain Still on the tid morphine  Did cut the topiramate at the last visit Pain actually seems to be less on the lower dose  Still relates her eating problems to her teeth issues "Not right" and has trouble chewing Seeing dentist soon Upper plate just doesn't stay in well No sense of taste  Current Outpatient Prescriptions on File Prior to Visit  Medication Sig Dispense Refill  . acyclovir (ZOVIRAX) 800 MG tablet TAKE ONE TABLET BY MOUTH FOUR TIMES DAILY 120 tablet 11  . aspirin 81 MG tablet Take 81 mg by mouth daily.      [name] LORazepam (ATIVAN) 1 MG tablet TAKE 1/2 TO 1 TABLET BY MOUTH AT BEDTIME 30 tablet 0  . morphine (MSIR) 15 MG tablet TAKE 1 TABLET BY MOUTH 3 TIMES A DAY AS NEEDED FOR SEVERE PAIN 90 tablet 0  . pantoprazole (PROTONIX) 40 MG tablet Take 1 tablet (40 mg total) by mouth 2 (two) times daily. 60 tablet 11  . topiramate (TOPAMAX) 100 MG tablet TAKE 1 TABLET BY MOUTH TWICE A DAY 60 tablet 11  . Multiple Vitamin (MULTIVITAMIN) tablet Take 1 tablet by mouth daily.     No current facility-administered medications on file prior to visit.    Allergies  Allergen Reactions  . Duloxetine     REACTION: N/V Mental status change and trouble with balance  . Omeprazole     REACTION: Didn't work  . Pregabalin     REACTION: SOB, swollen lips    Past Medical History  Diagnosis Date  . Breast cancer     Bilateral mastectomies 1986.  [name] GERD (gastroesophageal reflux disease)   . Hyperlipidemia   . Arthritis   . Depression   . Zoster      with Post-herpetic neuralgia  . History of partial thyroidectomy   . Dyspnea     Echo (5/11) was a difficult study due to  breast implants but showed normal LV and RV size and systolic function.      [name] Atypical chest pain     Lexiscan myoview (5/11) with EF 84%, normal wall motion, small fixed apical  perfusion defect likely due to breast attenuation, no evidence for ischemia or infarction.  **Patient had an  . CVA (cerebral infarction) 10/12    right lacunar  . Hypertension   . Obstruction of intestine   . Partial small bowel obstruction 7/14    no surgery    Past Surgical History  Procedure Laterality Date  . Abdominal hysterectomy    . Mastectomy  1980    bilateral  . Vaginal delivery      No family history on file.  History   Social History  . Marital Status: Widowed    Spouse Name: N/A    Number of Children: 2  . 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 Smoking status: Never Smoker   . Smokeless tobacco: Never Used  . Alcohol Use: No  . Drug Use: No  . Sexual Activity: Not on file   Other Topics Concern  . Not on file  Social History Narrative   No living will   No health care POA but requests daughter-in-law [name] to do this   Would like attempts at resuscitation   No feeding tube if cognitively unaware   Review of Systems Weight is stable Sleeps well    Objective:   Physical Exam  Constitutional: No distress.  Mild wasting Prominence of left forehead veins due to this  HENT:  Very slight redness in pharynx but doesn't look inflamed Mild pale nasal congestion  Neck: Normal range of motion. Neck supple.  Cardiovascular: Normal rate, regular rhythm and normal heart sounds.  Exam reveals no gallop.   No murmur heard. Pulmonary/Chest: Effort normal and breath sounds normal. No respiratory distress. She has no wheezes. She has no rales.  Abdominal: Soft. There is no tenderness.  Musculoskeletal: She exhibits no edema or tenderness.  Lymphadenopathy:    She has no cervical adenopathy.  Psychiatric: She has  a normal mood and affect. Her behavior is normal.          Assessment & Plan:

## 2014-07-29 NOTE — Assessment & Plan Note (Signed)
Working on improving her dentures Will try off the topiramate in case that is part of her poor appetite --she thinks it is mostly her teeth

## 2014-07-29 NOTE — Assessment & Plan Note (Signed)
Better but still mild symptoms if she moves quickly No syncope

## 2014-07-29 NOTE — Patient Instructions (Signed)
If you are having trouble with the MyChart---call [id number] for a new password. Please stop the topiramate to see how you do. If the pain gets worse, you you can restart it.

## 2014-08-19 ENCOUNTER — Other Ambulatory Visit: Payer: Self-pay | Admitting: Internal Medicine

## 2014-08-19 ENCOUNTER — Other Ambulatory Visit: Payer: Self-pay

## 2014-08-19 NOTE — Telephone Encounter (Signed)
[name] request rx for lorazepam and morphine to be picked up today ASAP. 08/19/14 at [address] will be closing for 2 days while moving the pharmacy and [name] wants to pick up rx to get filled today.Please advise. Pt last seen 07/29/14 and rxs last printed 07/21/14.

## 2014-08-20 MED ORDER — MORPHINE SULFATE 15 MG PO TABS: ORAL_TABLET | ORAL | Status: DC

## 2014-08-20 MED ORDER — LORAZEPAM 1 MG PO TABS: 0.5000 mg | ORAL_TABLET | Freq: Every day | ORAL | Status: DC

## 2014-08-20 NOTE — Telephone Encounter (Signed)
Medication has already been filled

## 2014-08-20 NOTE — Telephone Encounter (Signed)
Left message on voicemail Rx left in front office for pick up  

## 2014-09-16 ENCOUNTER — Other Ambulatory Visit: Payer: Self-pay | Admitting: Internal Medicine

## 2014-09-17 NOTE — Telephone Encounter (Signed)
Px written for call in   

## 2014-09-17 NOTE — Telephone Encounter (Signed)
Ativan refill request.  Patient last seen 07/29/2014.  Last filled 08/20/2014.  Please advise.

## 2014-09-17 NOTE — Telephone Encounter (Signed)
Ativan called into [facility]  Per Dr Glori Bickers ok.

## 2014-09-17 NOTE — Telephone Encounter (Signed)
Lorazepam last filled 08/20/14, last ov was a f/u on 07/29/14. She has a f/u scheduled on 11/29/14.

## 2014-09-22 ENCOUNTER — Other Ambulatory Visit: Payer: Self-pay | Admitting: Internal Medicine

## 2014-09-22 NOTE — Telephone Encounter (Signed)
Ok to refill? Last filled 08/20/14.

## 2014-09-23 NOTE — Telephone Encounter (Signed)
Spoke with patient and advised rx ready for pick-up and it will be at the front desk.  

## 2014-10-05 ENCOUNTER — Telehealth: Payer: Self-pay | Admitting: Internal Medicine

## 2014-10-05 NOTE — Telephone Encounter (Signed)
Spoke with pt and SOB is upon exertion; pt said is not bad enough to go to ED; pt scheduled appt on 10/06/14 at 1215 pm with Dr Silvio Pate; pt understands if condition changes or worsens with SOB she will go to [facility] or ED.

## 2014-10-05 NOTE — Telephone Encounter (Signed)
Okay. Sounds good.

## 2014-10-05 NOTE — Telephone Encounter (Signed)
Wayne from on team helath called and let us know that pt called in with chest congestion, ear congestion and worsening sob.  Ask a nurse advised pt to go to ED, and pt declined.  Pt said that she is able to come in for an office visit this afternoon because she has someone to bring her.  She does not have someone to take her to urgent care or to the ED.  Please advise.  Pts number is [phone]. Thanks.

## 2014-10-05 NOTE — Telephone Encounter (Signed)
PLEASE NOTE: All timestamps contained within this report are represented as [place] Standard Time. CONFIDENTIALTY NOTICE: This fax transmission is intended only for the addressee. It contains information that is legally privileged, confidential or otherwise protected from use or disclosure. If you are not the intended recipient, you are strictly prohibited from reviewing, disclosing, copying using or disseminating any of this information or taking any action in reliance on or regarding this information. If you have received this fax in error, please notify us immediately by telephone so that we can arrange for its return to [place]. Phone: [phone], Toll-Free: [phone], Fax: [phone] Page: 1 of 2 Call Id: [id number] [place] Patient Name: [name] Gender: Female DOB: Aug 18, 1927 Age: 81 Y 2 M 13 D Return Phone Number: [phone] (Primary) Address: City/State/Zip: [place] Client [place] Day - Client Client Site [place] - Day Physician Viviana Simpler Contact Type Call Call Type Triage / Clinical Relationship To Patient Self Appointment Disposition EMR Appointment Not Necessary Info pasted into Epic Yes Return Phone Number [phone] (Primary) Chief Complaint Ear Fullness or Congestion Initial Comment Caller states head stopped up, can't hear out of one ear PreDisposition Call Doctor Nurse Assessment Nurse: Mallie Mussel, RN, Alveta Heimlich Date/Time [name] Time): 10/05/2014 11:38:13 AM Confirm and document reason for call. If symptomatic, describe symptoms. ---Caller states that she has head congestion and ear congestion. She has trouble hearing out of her right ear. She has post nasal drip at the back of her throat. She states that she had shingles about 8-9 years ago and is still taking medication for it. She rates that pain in the right side of  her head as 8-9 on 0-10 scale. This is where she has had problems with the shingles. She has morphine to take for it. Denies fever. She has SOB. She has SOB all of the time. This is different for her. She has had periods of her right arm and right leg tingling. She does not have it at this time, but usually at night, and sometimes during the day also. Her heart rate is faster when she is having SOB. Has the patient traveled out of the country within the last 30 days? ---No Does the patient require triage? ---Yes Related visit to physician within the last 2 weeks? ---No Does the PT have any chronic conditions? (i.e. diabetes, asthma, etc.) ---Yes List chronic conditions. ---Reflux, Chronic Shingles Pain Guidelines Guideline Title Affirmed Question Affirmed Notes Nurse Date/Time (Eastern Time) Breathing Difficulty [1] MODERATE difficulty breathing (e.g., speaks in phrases, SOB even at rest, pulse 100-120) AND [2] NEWHenry, RN, Alveta Heimlich 10/05/2014 11:44:43 AM PLEASE NOTE: All timestamps contained within this report are represented as [place] Standard Time. CONFIDENTIALTY NOTICE: This fax transmission is intended only for the addressee. It contains information that is legally privileged, confidential or otherwise protected from use or disclosure. If you are not the intended recipient, you are strictly prohibited from reviewing, disclosing, copying using or disseminating any of this information or taking any action in reliance on or regarding this information. If you have received this fax in error, please notify us immediately by telephone so that we can arrange for its return to [place]. Phone: [phone], Toll-Free: [phone], Fax: [phone] Page: 2 of 2 Call Id: [id number] Guidelines Guideline Title Affirmed Question Affirmed Notes Nurse Date/Time [name] Time) onset or WORSE than normal Disp. Time [name] Time) Disposition Final User 10/05/2014 11:47:31 AM  Go to ED Now Yes Mallie Mussel,  RN, Ola [name] Understands: Yes Disagree/Comply: Disagree Disagree/Comply Reason: Disagree with instructions Care Advice Given Per Guideline GO TO ED NOW: You need to be seen in the Emergency Department. Go to the ER at ___________ [place] now. Drive carefully. * Another adult should drive. * Please bring a list of your current medicines when you go to the Emergency Department (ER). After Care Instructions Given Call Event Type User Date / Time Description Comments User: Reeves Forth, RN Date/Time [name] Time): 10/05/2014 11:51:09 AM I called the backline and provided information to [facility]. Verbalized understanding. User: Reeves Forth, RN Date/Time [name] Time): 10/05/2014 11:57:31 AM Having trouble getting into [place] system. Nurse Tye Maryland states that we received a message that they would be down for about 5 minutes. She received this 2-3 minutes ago while I was at lunch. Referrals GO TO FACILITY REFUSED

## 2014-10-05 NOTE — Telephone Encounter (Signed)
Patient Name: [name]  DOB: [birth year]    Initial Comment Caller states head stopped up, can't hear out of one ear   Nurse Assessment  Nurse: Mallie Mussel, RN, Alveta Heimlich Date/Time [name] Time): 10/05/2014 11:38:13 AM  Confirm and document reason for call. If symptomatic, describe symptoms. ---Caller states that she has head congestion and ear congestion. She has trouble hearing out of her right ear. She has post nasal drip at the back of her throat. She states that she had shingles about 8-9 years ago and is still taking medication for it. She rates that pain in the right side of her head as 8-9 on 0-10 scale. This is where she has had problems with the shingles. She has morphine to take for it. Denies fever. She has SOB. She has SOB all of the time. This is different for her. She has had periods of her right arm and right leg tingling. She does not have it at this time, but usually at night, and sometimes during the day also. Her heart rate is faster when she is having SOB.  Has the patient traveled out of the country within the last 30 days? ---No  Does the patient require triage? ---Yes  Related visit to physician within the last 2 weeks? ---No  Does the PT have any chronic conditions? (i.e. diabetes, asthma, etc.) ---Yes  List chronic conditions. ---Reflux, Chronic Shingles Pain     Guidelines    Guideline Title Affirmed Question Affirmed Notes  Breathing Difficulty [1] MODERATE difficulty breathing (e.g., speaks in phrases, SOB even at rest, pulse 100-120) AND [2] NEW-onset or WORSE than normal    Final Disposition User   Go to ED Now Mallie Mussel, RN, Alveta Heimlich    Comments  I called the backline and provided information to [facility]. Verbalized understanding.  Having trouble getting into [facility] system. Nurse Tye Maryland states that we received a message that they would be down for about 5 minutes. She received this 2-3 minutes ago while I was at lunch.

## 2014-10-05 NOTE — Telephone Encounter (Signed)
The pain is really a chronic thing If the breathing is not really that bad, can probably see in office (though I probably can't fit her in till tomorrow since I am just about done here for today

## 2014-10-05 NOTE — Telephone Encounter (Signed)
See team health note 10/05/14; pt scheduled appt with Dr Silvio Pate 09/09/14 and if condition changes or worsens prior to appt pt will go to [facility] or ED.

## 2014-10-05 NOTE — Telephone Encounter (Signed)
See if someone else can see her as I am at [facility] in afternoon today

## 2014-10-06 ENCOUNTER — Ambulatory Visit: Payer: Commercial Managed Care - HMO | Admitting: Internal Medicine

## 2014-10-07 ENCOUNTER — Encounter: Payer: Self-pay | Admitting: Family Medicine

## 2014-10-07 ENCOUNTER — Ambulatory Visit (INDEPENDENT_AMBULATORY_CARE_PROVIDER_SITE_OTHER): Payer: Commercial Managed Care - HMO | Admitting: Family Medicine

## 2014-10-07 ENCOUNTER — Telehealth: Payer: Self-pay

## 2014-10-07 VITALS — BP 120/60 | HR 79 | Temp 97.4°F | Ht 67.0 in | Wt 89.2 lb

## 2014-10-07 DIAGNOSIS — H6091 Unspecified otitis externa, right ear: Secondary | ICD-10-CM | POA: Diagnosis not present

## 2014-10-07 DIAGNOSIS — H6591 Unspecified nonsuppurative otitis media, right ear: Secondary | ICD-10-CM | POA: Diagnosis not present

## 2014-10-07 MED ORDER — NEOMYCIN-POLYMYXIN-HC 3.5-10000-1 OT SOLN: 3.0000 [drp] | Freq: Four times a day (QID) | OTIC | Status: DC

## 2014-10-07 MED ORDER — AMOXICILLIN 875 MG PO TABS: 875.0000 mg | ORAL_TABLET | Freq: Two times a day (BID) | ORAL | Status: DC

## 2014-10-07 NOTE — Progress Notes (Signed)
Pre visit review using our clinic review tool, if applicable. No additional management support is needed unless otherwise documented below in the visit note. 

## 2014-10-07 NOTE — Telephone Encounter (Signed)
PLEASE NOTE: All timestamps contained within this report are represented as [place] Standard Time. CONFIDENTIALTY NOTICE: This fax transmission is intended only for the addressee. It contains information that is legally privileged, confidential or otherwise protected from use or disclosure. If you are not the intended recipient, you are strictly prohibited from reviewing, disclosing, copying using or disseminating any of this information or taking any action in reliance on or regarding this information. If you have received this fax in error, please notify us immediately by telephone so that we can arrange for its return to [place]. Phone: [phone], Toll-Free: [phone], Fax: [phone] Page: 1 of 2 Call Id: [id number] [facility] Patient Name: [name] Gender: Female DOB: January 28, 1928 Age: 81 Y 2 M 14 D Return Phone Number: [phone] (Primary) Address: [address] City/State/Zip: [place] [address] Client [facility] Night - Client Client Site [place] Physician Viviana Simpler Contact Type Call Call Type Triage / [place] Name [name] Relationship To Patient Daughter Return Phone Number [phone] (Primary) Chief Complaint Earache Initial Comment Caller States her mother is complaining about right ear pain GOTO Facility Not Listed See MD within 24hrs PreDisposition Did not know what to do Nurse Assessment Nurse: Venetia Maxon, RN, Manuela Schwartz Date/Time (Eastern Time): 10/06/2014 6:53:54 PM Confirm and document reason for call. If symptomatic, describe symptoms. ---Caller States her mother is complaining about right ear pain no recent cold symptoms no thermometer. It feels like a roar in her ear and congestion. which began a few weeks. Has the patient traveled out of the country within the last 30 days? ---No Does the  patient require triage? ---Yes Related visit to physician within the last 2 weeks? ---No Does the PT have any chronic conditions? (i.e. diabetes, asthma, etc.) ---Yes List chronic conditions. ---eye Dr recently she has shingles in her right eye and on her optic nerve takes acyclovir Guidelines Guideline Title Affirmed Question Affirmed Notes Nurse Date/Time [name] Time) Ear - Congestion Earache persists > 1 hour Foster Simpson 10/06/2014 6:56:23 PM Disp. Time [name] Time) Disposition Final User 10/06/2014 6:58:25 PM See Physician within 24 Hours Yes Venetia Maxon, RN, Edwena Bunde Understands: Yes Disagree/Comply: Comply PLEASE NOTE: All timestamps contained within this report are represented as [place] Standard Time. CONFIDENTIALTY NOTICE: This fax transmission is intended only for the addressee. It contains information that is legally privileged, confidential or otherwise protected from use or disclosure. If you are not the intended recipient, you are strictly prohibited from reviewing, disclosing, copying using or disseminating any of this information or taking any action in reliance on or regarding this information. If you have received this fax in error, please notify us immediately by telephone so that we can arrange for its return to [place]. Phone: [phone], Toll-Free: [phone], Fax: [phone] Page: 2 of 2 Call Id: [id number] Care Advice Given Per Guideline SEE PHYSICIAN WITHIN 24 HOURS: CALL BACK IF: * Severe ear pain occurs * You become worse. CARE ADVICE given per Ear - Congestion (Adult) guideline. After Care Instructions Given Call Event Type User Date / Time Description Referrals REFERRED TO PCP OFFICE

## 2014-10-07 NOTE — Telephone Encounter (Signed)
Pt has appt with Dr Lorelei Pont 10/07/14 at 3:15 PM.

## 2014-10-07 NOTE — Telephone Encounter (Signed)
PLEASE NOTE: All timestamps contained within this report are represented as [place] Standard Time. CONFIDENTIALTY NOTICE: This fax transmission is intended only for the addressee. It contains information that is legally privileged, confidential or otherwise protected from use or disclosure. If you are not the intended recipient, you are strictly prohibited from reviewing, disclosing, copying using or disseminating any of this information or taking any action in reliance on or regarding this information. If you have received this fax in error, please notify us immediately by telephone so that we can arrange for its return to [place]. Phone: [phone], Toll-Free: [phone], Fax: [phone] Page: 1 of 1 Call Id: [id number] [place] Patient Name: [name] Gender: Female DOB: 1928/04/29 Age: 81 Y 2 M 15 D Return Phone Number: [phone] (Primary) Address: City/State/Zip: [place] Client [place] Day - Client Client Site [place] - Day Physician Viviana Simpler Contact Type Call Call Type Triage / Clinical Relationship To Patient Self Appointment Disposition EMR Caller Not Reached Info pasted into Epic No Return Phone Number [phone] (Primary) Chief Complaint Dizziness Initial Comment caller states her head and ears are very congested - states she cannot hear out of one of her ears - has been dizzy and is c/o sharp pain in her head Nurse Assessment Guidelines Guideline Title Affirmed Question Affirmed Notes Nurse Date/Time ([place] Time) Disp. Time [name] Time) Disposition Final User 10/05/2014 9:41:39 AM Send To RN Personal Mallie Mussel, RN, Wade 10/05/2014 9:48:06 AM Call Completed Donalynn Furlong, RN, Myna Hidalgo 10/06/2014 8:38:19 PM FINAL ATTEMPT MADE - no message left Yes Donne Anon, RN, Hilda Blades After Care Instructions Given Call Event Type User Date  / Time Description Comments User: Gennie [place], RN Date/Time [name] Time): 10/05/2014 9:37:40 AM Unable to reach pt at this "non-working" number. No alternate number given. [place] aware User: Reeves Forth, RN Date/Time [name] Time): 10/05/2014 9:41:19 AM 7579- Attempt made. Message that this number is not in service. Lead PC Valseca asked to double check the number. User: Davis Gourd Date/Time (Eastern Time): 10/05/2014 9:45:51 AM Call reviewed number is correct per caller. Nurse notified

## 2014-10-07 NOTE — Progress Notes (Signed)
Dr. Frederico Hamman T. Aras Albarran, MD, [facility] [place], [address] Phone: [phone] Fax: [phone]  10/07/2014  Patient: [name], MRN: [id number], DOB: Dec 08, 1927, 81 y.o.  Primary Physician:  Viviana Simpler, MD  Chief Complaint: Ear Pain; Facial Pain; Dizziness; and Shortness of Breath  Subjective:   [name] is a 81 y.o. very pleasant female patient who presents with the following:  Right side of her head is bothering her. Feels like has something in her ear and stopped up. She has a number of chronic pain complaints including herpetic neuralgia, and she takes morphine 15 mg. She continues to have an ongoing case of significant postherpetic neuralgia on her scalp on the right side.  She complains of pain in her ear that is new and different and feeling stuffed up and she cannot hear. This is all on the right ear.  Ears have draining some. Cold some.  Sweaty.   Wt Readings from Last 3 Encounters:  10/07/14 89 lb 4 oz (40.484 kg)  07/29/14 89 lb 6.4 oz (40.552 kg)  06/17/14 90 lb (40.824 kg)      Past Medical History, Surgical History, Social History, Family History, Problem List, Medications, and Allergies have been reviewed and updated if relevant.  ROS: GEN: Acute illness details above GI: Tolerating PO intake GU: maintaining adequate hydration and urination Pulm: No SOB Interactive and getting along well at home.  Otherwise, ROS is as per the HPI.   Objective:   BP 120/60 mmHg  Pulse 79  Temp(Src) 97.4 F (36.3 C) (Oral)  Ht 5\' 7"  (1.702 m)  Wt 89 lb 4 oz (40.484 kg)  BMI 13.98 kg/m2  SpO2 99%   GEN: WDWN, NAD, Non-toxic, A & O x 3 HEENT: Atraumatic, Normocephalic. Neck supple. No masses, No LAD. Ears and Nose: No external deformity. Right ear is tender to palpation. Tragus is tender. Tympanic membrane is bulging and somewhat reddish in appearance with indistinct landmarks. Left TM is  normal. CV: RRR, No M/G/R. No JVD. No thrill. No extra heart sounds. PULM: CTA B, no wheezes, crackles, rhonchi. No retractions. No resp. distress. No accessory muscle use. EXTR: No c/c/e NEURO Normal gait.  PSYCH: Normally interactive. Conversant. Not depressed or anxious appearing.  Calm demeanor.     Laboratory and Imaging Data:  Assessment and Plan:   Otitis media with effusion, right  Otitis externa, right  OM, a confounder is her herpetic neuralgia, but her external ear is certainly tender. I am going to treat this also for OE.  Follow-up: No Follow-up on file.  New Prescriptions   AMOXICILLIN (AMOXIL) 875 MG TABLET    Take 1 tablet (875 mg total) by mouth 2 (two) times daily.   NEOMYCIN-POLYMYXIN-HYDROCORTISONE (CORTISPORIN) OTIC SOLUTION    Place 3 drops into the right ear 4 (four) times daily.   No orders of the defined types were placed in this encounter.    Signed,  Maud Deed. Afomia Blackley, MD   Patient's Medications  New Prescriptions   AMOXICILLIN (AMOXIL) 875 MG TABLET    Take 1 tablet (875 mg total) by mouth 2 (two) times daily.   NEOMYCIN-POLYMYXIN-HYDROCORTISONE (CORTISPORIN) OTIC SOLUTION    Place 3 drops into the right ear 4 (four) times daily.  Previous Medications   ACYCLOVIR (ZOVIRAX) 800 MG TABLET    TAKE ONE TABLET BY MOUTH FOUR TIMES DAILY   ASPIRIN 81 MG TABLET    Take 81 mg  by mouth daily.     LORAZEPAM (ATIVAN) 1 MG TABLET    TAKE 1/2 TO 1 TABLET BY MOUTH AT BEDTIME   MORPHINE (MSIR) 15 MG TABLET    TAKE 1 TABLET BY MOUTH 3 TIMES A DAY AS NEEDED FOR SEVERE PAIN   MULTIPLE VITAMIN (MULTIVITAMIN) TABLET    Take 1 tablet by mouth daily.   PANTOPRAZOLE (PROTONIX) 40 MG TABLET    TAKE 1 TABLET BY MOUTH TWICE A DAY   TOPIRAMATE (TOPAMAX) 100 MG TABLET    TAKE 1 TABLET BY MOUTH TWICE A DAY  Modified Medications   No medications on file  Discontinued Medications   No medications on file

## 2014-10-20 ENCOUNTER — Other Ambulatory Visit: Payer: Self-pay | Admitting: Family Medicine

## 2014-10-20 NOTE — Telephone Encounter (Signed)
Approved: 30 x 0 

## 2014-10-20 NOTE — Telephone Encounter (Signed)
Lorazepam refill request.  Last seen 10/07/2014.  Last filled 09/17/2014.  Please advise.

## 2014-10-20 NOTE — Telephone Encounter (Signed)
Called to [employer] [facility].

## 2014-10-22 ENCOUNTER — Other Ambulatory Visit: Payer: Self-pay | Admitting: Internal Medicine

## 2014-10-22 NOTE — Telephone Encounter (Signed)
Pt left v/m requesting refill morphine.Please advise.

## 2014-10-22 NOTE — Telephone Encounter (Signed)
09/23/14 LETVAK PATIENT, Please send back to me for call in

## 2014-10-25 NOTE — Telephone Encounter (Signed)
Spoke with patient and advised rx ready for pick-up and it will be at the front desk.  

## 2014-10-29 NOTE — Discharge Summary (Signed)
PATIENT NAME:  [name], [name] MR#:  [id number] DATE OF BIRTH:  [birth year]  DATE OF ADMISSION:  01/17/2013 DATE OF DISCHARGE:  01/23/2013  HISTORY OF PRESENT ILLNESS: This 81 year old female was admitted via the Emergency Room with a 36-hour history of feeling poorly. She developed a sudden onset of hypogastric pain after eating a tomato sandwich and initially was evaluated at an urgent care and thought to have diverticulitis, but her pain persisted overnight and had not improved and she presented to the Emergency Room. She also started to have some vomiting. A CT scan of the abdomen was suggestive of small bowel obstruction in the distal small bowel area. Her past history included a transabdominal hysterectomy in the 1970s, history of breast cancer in the 1980s and subsequent reconstruction. 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 At the time of initial evaluation, it was noted her abdomen was not distended. Her bowel sounds were relatively quiet. There was some nonfocal tenderness in the left lower quadrant and suprapubic area. No evidence of hernias.   HOSPITAL COURSE: The patient was admitted at that time with a diagnosis of a partial small bowel obstruction. The patient was treated conservatively with n.p.o. and IV fluids and an NG tube. Over the next several days, the patient showed gradual improvement and her pain subsided. A followup KUB on 07/16 showed nearly all the barium from the small bowel follow-through was in the colon. The small bowel follow-through did suggest a possible obstruction in the distal portion of the small intestine. There were still some mildly dilated loops of small bowel at that time, but clinically the patient was markedly improved. Her NG tube was then discontinued and over the next couple of days the patient was able to tolerate oral intake.  She was advanced from clear liquids to a soft diet without any problems. At the time of discharge, the patient was having no pain. She was tolerating a soft diet. She has had some loose stools. Abdominal exam was essentially normal. The patient was discharged with instructions to call if she has any recurrence of symptoms and she had a followup appointment scheduled in a weeks' time, at which point a followup abdominal x-ray will be obtained to see if she still had distended bowel, in which case a laparoscopy would be recommended. This was discussed fully with the patient at the time of discharge.   FINAL DIAGNOSES:  1.  Partial small bowel obstruction.  2.  History of breast cancer.  3.  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  ____________________________ S.Robinette Haines, MD sgs:aw D: 02/19/2013 08:38:39 ET T: 02/19/2013 08:57:14 ET JOB#: [id number]  cc: Synthia Innocent. Jamal Collin, MD, <Dictator> [facility] Robinette Haines MD ELECTRONICALLY SIGNED 02/19/2013 18:19

## 2014-10-29 NOTE — H&P (Signed)
PATIENT NAME:  [name], [name] MR#:  [id number] DATE OF BIRTH:  [birth year]  DATE OF ADMISSION:  01/17/2013  ADMISSION DIAGNOSIS:  Partial small bowel obstruction.   CLINICAL NOTE:  This 81 year old woman was well until approximately 36 hours prior to admission when she began to feel less than her normal self.  Approximately at 2:00 p.m. on the afternoon of July 11 she had developed a sudden onset of hypogastric pain shortly after eating a tomato sandwich.  The pain waxed and waned throughout the course of the afternoon.  She went to the local urgent care center and was diagnosed with diverticulitis, discharged home on Cipro and Flagyl.  The patient's discomfort progressed over the course of the night and had not improved this morning.  She vomited after her morning dose of antibiotics prompting her to present to the Emergency Room for assessment.  During her evaluation in the ED a CT scan of the abdomen and pelvis was completed suggesting a small bowel obstruction of the distal small bowel.  She is admitted now for further management.   The patient was attended today by her son Jori Moll and daughter-in-law [name] who were quite helpful in filling in some of the gaps of her medical history.   PAST SURGICAL HISTORY:  Notable for a transabdominal hysterectomy in the 1970s.  The patient had breast cancer in the 1980s and subsequent reconstruction.  The most significant history was an episode of severe cranial zoster infection approximately six years ago resulting in blindness in her right eye and the need for chronic narcotics since that time.  In 2012, she was thought to have suffered a CVA, hospitalized in [place] and required approximately six weeks of rehabilitation after a weeklong hospitalization.  A significant effort at that time was made to deescalate her medications to her present status with general improvement in her alertness and function without a significant impairment in her pain control.  She  continues to live independently although her daughter-in-law checks in on her daily.  The patient's weight prior to her illness two years ago was approximately 115 pounds and she lost down to 94 in the months after her CVA, but by her daughter-in-law's report has been stable since that time.   The patient reports that except for fairly constant burning on urination, which has not changed over the last several years, she has not had any abdominal or pelvic complaints until today.   The patient was observed to have episodic waves of pain during the interview and on exam these appeared to be associated with borborygmi.   PHYSICAL EXAMINATION:  GENERAL:  The patient was awake, alert and orientated and very cooperative.  NECK:  Was notable for a well-healed incision from a previous thyroidectomy.  LUNGS:  Were clear to auscultation.  CARDIAC:  Showed a regular rhythm.  There was a question of an S3 gallop.  EXTREMITIES:  Femoral DP and PT pulses were 2+ and palpable bilaterally.  There was no peripheral edema or murmur appreciated.   ABDOMEN:  Was nondistended.  Bowel sounds were generally quiet, but occasionally did show some rushes.  There appeared to be some tenderness with palpation of the left lower quadrant more than any other area.  No evidence of inguinal, incisional or femoral hernias were noted.   LABORATORY STUDIES:  Obtained through the Emergency Department were reviewed.  These were notable for a white blood cell count of 10,300 with 86% polys and 8% lymphocytes, a hemoglobin of 14.5 and a  platelet count of 140,000.  MCV was noted to be elevated at 102.  Liver functions were normal.  Electrolytes were notable for a scant elevation of the serum chloride at 108.  Estimated GFR 51.  Creatinine 1.0.  Random blood sugar 134.  Urinalysis showed a specific gravity of 1.014 and 7 RBC; 3 WBC per high power field.  No bacteria were identified.   The CT scan was reviewed with the radiologist.  There  appeared to be modest dilatation of the small bowel with incomplete transit of orally administered contrast.  No colonic dilatation noted.  No free air.   Plain films of the abdomen show some modest air fluid levels.  The NG tube at the tip of the GE junction will be advanced by the nursing staff on the floor.  Again, no free air.  Chest x-ray showed emphysematous changes.  Contrast was noted in the ureters on the left side, but had passed the right side.  The bladder shadow was unremarkable.   IMPRESSION:  Small bowel obstruction.  This is likely secondary to adhesions.  Unlikely breast metastatic disease 30 years post treatment.   The patient will be treated with nasogastric decompression and IV fluids.  She had been making use of 15 mg of morphine sulfate tablets twice daily.  In consultation with pharmacy the equivalent dose will be a 12 mg fentanyl patch q. 3 days.  She will be given her metoprolol and lorazepam by mouth and NG will be clamped during that installation time.   Based on her clinical progress, she may require exploratory laparotomy.   Plans for clinical course were reviewed with the patient and her family.     ____________________________ Robert Bellow, MD jwb:ea D: 01/17/2013 20:39:19 ET T: 01/17/2013 23:02:10 ET JOB#: [id number]  cc: Robert Bellow, MD, <Dictator> Venia Carbon, MD Robert Bellow, MD  Elwyn Lowden Amedeo Kinsman MD ELECTRONICALLY SIGNED 01/18/2013 10:10

## 2014-11-02 ENCOUNTER — Encounter: Payer: Self-pay | Admitting: Internal Medicine

## 2014-11-02 ENCOUNTER — Ambulatory Visit (INDEPENDENT_AMBULATORY_CARE_PROVIDER_SITE_OTHER): Payer: Commercial Managed Care - HMO | Admitting: Internal Medicine

## 2014-11-02 VITALS — BP 120/70 | HR 80 | Temp 98.1°F | Wt 91.0 lb

## 2014-11-02 DIAGNOSIS — H7391 Unspecified disorder of tympanic membrane, right ear: Secondary | ICD-10-CM

## 2014-11-02 NOTE — Progress Notes (Signed)
Pre visit review using our clinic review tool, if applicable. No additional management support is needed unless otherwise documented below in the visit note. 

## 2014-11-02 NOTE — Progress Notes (Signed)
Subjective:    Patient ID: [name], female    DOB: 09/13/1927, 81 y.o.   MRN: [id number]  HPI Here due to ongoing right ear pain Entire side of head pain is worse then usual The ear still doesn't feel better Hears rattling and crackling Pain along side of neck and throat is sore  No help with drops or the antibiotic Drops just seemed to stop it up more  Current Outpatient Prescriptions on File Prior to Visit  Medication Sig Dispense Refill  . acyclovir (ZOVIRAX) 800 MG tablet TAKE ONE TABLET BY MOUTH FOUR TIMES DAILY 120 tablet 11  . aspirin 81 MG tablet Take 81 mg by mouth daily.      [name] LORazepam (ATIVAN) 1 MG tablet TAKE 1/2 TO 1 TABLET BY MOUTH AT BEDTIME 30 tablet 0  . morphine (MSIR) 15 MG tablet TAKE 1 TABLET BY MOUTH 3 TIMES A DAY AS NEEDED FOR SEVERE PAIN 90 tablet 0  . Multiple Vitamin (MULTIVITAMIN) tablet Take 1 tablet by mouth daily.    . pantoprazole (PROTONIX) 40 MG tablet TAKE 1 TABLET BY MOUTH TWICE A DAY 60 tablet 5  . topiramate (TOPAMAX) 100 MG tablet TAKE 1 TABLET BY MOUTH TWICE A DAY 60 tablet 11   No current facility-administered medications on file prior to visit.    Allergies  Allergen Reactions  . Duloxetine     REACTION: N/V Mental status change and trouble with balance  . Omeprazole     REACTION: Didn't work  . Pregabalin     REACTION: SOB, swollen lips    Past Medical History  Diagnosis Date  . Breast cancer     Bilateral mastectomies 1986.  [name] GERD (gastroesophageal reflux disease)   . Hyperlipidemia   . Arthritis   . Depression   . Zoster      with Post-herpetic neuralgia  . History of partial thyroidectomy   . Dyspnea     Echo (5/11) was a difficult study due to breast implants but showed normal LV and RV size and systolic function.      [name] Atypical chest pain     Lexiscan myoview (5/11) with EF 84%, normal wall motion, small fixed apical  perfusion defect likely due to breast attenuation, no evidence for ischemia or  infarction.  **Patient had an  . CVA (cerebral infarction) 10/12    right lacunar  . Hypertension   . Obstruction of intestine   . Partial small bowel obstruction 7/14    no surgery    Past Surgical History  Procedure Laterality Date  . Abdominal hysterectomy    . Mastectomy  1980    bilateral  . Vaginal delivery      No family history on file.  History   Social History  . Marital Status: Widowed    Spouse Name: N/A  . Number of Children: 2  . 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 Smoking status: Never Smoker   . Smokeless tobacco: Never Used  . Alcohol Use: No  . Drug Use: No  . Sexual Activity: Not on file   Other Topics Concern  . Not on file   Social History Narrative   No living will   No health care POA but requests daughter-in-law [name] to do this   Would like attempts at resuscitation   No feeding tube if cognitively unaware   Review of Systems Hearing  is off  No fever No rhinorrhea or cough--does get some drainage sensation in throat Itching in throat and ear--no prior allergy symptoms    Objective:   Physical Exam  Constitutional: No distress.  HENT:  Mild pale nasal congestion Left TM and canal normal Right canal not inflamed Right TM intact with tan discoloration behind TM          Assessment & Plan:

## 2014-11-02 NOTE — Patient Instructions (Signed)
Please try loratadine 10mg  1-2 daily or cetirizine 10mg  daily to see if that helps the itching and ear sensation.

## 2014-11-02 NOTE — Assessment & Plan Note (Signed)
Itchy symptoms could be allergic component Will have her try antihistamine Needs ENT appt to exclude cholesteatoma or other middle ear pathology

## 2014-11-15 ENCOUNTER — Other Ambulatory Visit: Payer: Self-pay | Admitting: Internal Medicine

## 2014-11-15 NOTE — Telephone Encounter (Signed)
Approved: 30 x 0 

## 2014-11-15 NOTE — Telephone Encounter (Signed)
10/20/14 

## 2014-11-15 NOTE — Telephone Encounter (Signed)
rx called into pharmacy

## 2014-11-26 ENCOUNTER — Other Ambulatory Visit: Payer: Self-pay | Admitting: Family Medicine

## 2014-11-26 NOTE — Telephone Encounter (Signed)
Left message on machine that rx is ready for pick-up, and it will be at our front desk.  

## 2014-11-26 NOTE — Telephone Encounter (Signed)
Last office visit 11/02/2014.  Last refilled 10/22/2014 for #90 with no refills.  Ok to refill?

## 2014-11-29 ENCOUNTER — Ambulatory Visit: Payer: Commercial Managed Care - HMO | Admitting: Internal Medicine

## 2014-12-15 ENCOUNTER — Other Ambulatory Visit: Payer: Self-pay | Admitting: Internal Medicine

## 2014-12-15 NOTE — Telephone Encounter (Signed)
Ok to phone in ativan 

## 2014-12-15 NOTE — Telephone Encounter (Signed)
11/15/14  LETVAK PATIENT, Please send back to me for call in

## 2014-12-15 NOTE — Telephone Encounter (Signed)
rx called into pharmacy

## 2014-12-24 ENCOUNTER — Other Ambulatory Visit: Payer: Self-pay | Admitting: Internal Medicine

## 2014-12-27 ENCOUNTER — Other Ambulatory Visit: Payer: Self-pay | Admitting: Internal Medicine

## 2014-12-27 NOTE — Telephone Encounter (Signed)
11/26/2014 

## 2014-12-27 NOTE — Telephone Encounter (Signed)
Spoke with patient and advised rx ready for pick-up and it will be at the front desk.  

## 2015-01-04 ENCOUNTER — Telehealth: Payer: Self-pay | Admitting: Internal Medicine

## 2015-01-04 NOTE — Telephone Encounter (Signed)
Gary Day - Client [facility] Patient Name: [name] DOB: 08-12-1927 Initial Comment Caller states thinks she has infection in ear and eyes, throat is dry, feels like it is sore, makes her dizzy, feels short winded Nurse Assessment Nurse: Donalynn Furlong, RN, Myna Hidalgo Date/Time [name] Time): 01/04/2015 12:42:12 PM Confirm and document reason for call. If symptomatic, describe symptoms. ---Caller states thinks she has infection in ear and eyes, throat is dry, feels like it is sore, makes her dizzy, feels short winded . Pt states she has an infected tooth on her right side, was placed on antibiotics for 10 days,is finished RX and pt feels like her condition "is no better". Pt has NO chest pain or SOB. Pt c/ o headache. Pt took MSO4 for her HA. Pt is scheduled for extraction in August of this summer. Pt also has scheduled FU appt with Dr Damita Dunnings tomorrow at 5 pm that she is aware of and intends on keeping. Has the patient traveled out of the country within the last 30 days? ---No Does the patient require triage? ---Yes Related visit to physician within the last 2 weeks? ---Yes Does the PT have any chronic conditions? (i.e. diabetes, asthma, etc.) ---Yes List chronic conditions. ---CPS takes po MSO4 Guidelines Guideline Title Affirmed Question Affirmed Notes Toothache Toothache present > 24 hours Final Disposition User Call Dentist when Office is Open Donalynn Furlong, Therapist, sports, Myna Hidalgo

## 2015-01-04 NOTE — Telephone Encounter (Signed)
Pt has appt on 01/05/15 at 5 pm with Dr Damita Dunnings.

## 2015-01-05 ENCOUNTER — Encounter: Payer: Self-pay | Admitting: Family Medicine

## 2015-01-05 ENCOUNTER — Ambulatory Visit (INDEPENDENT_AMBULATORY_CARE_PROVIDER_SITE_OTHER): Payer: Commercial Managed Care - HMO | Admitting: Family Medicine

## 2015-01-05 VITALS — BP 122/74 | HR 72 | Temp 98.7°F | Wt 91.5 lb

## 2015-01-05 DIAGNOSIS — B0229 Other postherpetic nervous system involvement: Secondary | ICD-10-CM | POA: Diagnosis not present

## 2015-01-05 MED ORDER — TOPIRAMATE 100 MG PO TABS: 100.0000 mg | ORAL_TABLET | Freq: Every day | ORAL | Status: DC

## 2015-01-05 NOTE — Progress Notes (Signed)
Pre visit review using our clinic review tool, if applicable. No additional management support is needed unless otherwise documented below in the visit note.  R facial burning pain.  Longstanding, present for years.  Still on morphine and started back on topamax prev, 50mg  qhs. Blind in R eye from prev shingles.  Frequent tearing.   Low dose of gabapentin didn't help and couldn't tolerate high dose of gabapentin.   Can't tolerate lyrica.    Had abnormal appearing R TM treated with abx per ENT.  Note from Dr. Richardson Landry reviewed.   Also with recent gum abrasion treated with abx per dental clinic.   Off abx now.    She prev had a NG tube placed years ago, and since then her nose "hasn't felt right."  She doesn't recall flex fiberoptic nasal exam per ENT.    PMH and SH reviewed  ROS: See HPI, otherwise noncontributory.  Meds, vitals, and allergies reviewed.   nad Elderly female Tm wnl B, no erythema Nasal exam wnl on inspection.   OP with poor dentition but no acute changes.  Minimal irritation on the lower gum line.  Abnormal sensation R V1-3 w/o acute rash.  Chronic R eye changes noted.   Neck supple, no LA rrr ctab

## 2015-01-05 NOTE — Patient Instructions (Signed)
I would try taking 100mg  of topamax at night for about 1 week if tolerated.  See if that helps the pain.   Update [place] either way.   Don't change your other meds.   Take care.  Glad to see you.

## 2015-01-06 ENCOUNTER — Encounter: Payer: Self-pay | Admitting: Family Medicine

## 2015-01-06 NOTE — Assessment & Plan Note (Addendum)
Worsened.  Would inc topamax to 100mg  qhs and then update [place].  She doesn't appear to have an active infection (VZV on the face, TMs, gumline).  I don't know of other good options.  I don't see anything of note on nasal exam, but she could have internal changes from NG tube placement though the likelihood would be low.  That would likely be incidental.   Will route to PCP as FYI.

## 2015-01-17 ENCOUNTER — Other Ambulatory Visit: Payer: Self-pay | Admitting: Internal Medicine

## 2015-01-18 NOTE — Telephone Encounter (Signed)
rx called into pharmacy

## 2015-01-18 NOTE — Telephone Encounter (Signed)
Last filled 12/15/14 #30--please advise if okay to refill

## 2015-01-18 NOTE — Telephone Encounter (Signed)
Approved: okay #[phone] x 0 

## 2015-01-24 ENCOUNTER — Other Ambulatory Visit: Payer: Self-pay | Admitting: Internal Medicine

## 2015-01-25 NOTE — Telephone Encounter (Signed)
12/2014

## 2015-01-25 NOTE — Telephone Encounter (Signed)
Spoke with patient and advised rx ready for pick-up and it will be at the front desk.  

## 2015-01-27 ENCOUNTER — Encounter: Payer: Self-pay | Admitting: Family Medicine

## 2015-01-27 ENCOUNTER — Ambulatory Visit (INDEPENDENT_AMBULATORY_CARE_PROVIDER_SITE_OTHER): Payer: Commercial Managed Care - HMO | Admitting: Family Medicine

## 2015-01-27 ENCOUNTER — Ambulatory Visit (INDEPENDENT_AMBULATORY_CARE_PROVIDER_SITE_OTHER)
Admission: RE | Admit: 2015-01-27 | Discharge: 2015-01-27 | Disposition: A | Discharge status: Home / Self Care | Payer: Commercial Managed Care - HMO | Source: Ambulatory Visit | Attending: Family Medicine | Admitting: Family Medicine

## 2015-01-27 VITALS — BP 140/52 | HR 89 | Temp 98.3°F | Ht 66.0 in | Wt 92.5 lb

## 2015-01-27 DIAGNOSIS — M25571 Pain in right ankle and joints of right foot: Secondary | ICD-10-CM | POA: Insufficient documentation

## 2015-01-27 DIAGNOSIS — M25572 Pain in left ankle and joints of left foot: Secondary | ICD-10-CM | POA: Insufficient documentation

## 2015-01-27 MED ORDER — CEPHALEXIN 500 MG PO CAPS: 500.0000 mg | ORAL_CAPSULE | Freq: Three times a day (TID) | ORAL | Status: DC

## 2015-01-27 NOTE — Patient Instructions (Addendum)
We will call you with X-ray result.  Elevate ankle, ice as able.  If no fracture start antibiotics for possible cellulitis.  Call if fever or redness spreading.  Follow up if not improved by time antibiotics are completed,

## 2015-01-27 NOTE — Progress Notes (Signed)
   Subjective:    Patient ID: [name], female    DOB: 04-18-1928, 81 y.o.   MRN: [id number]  HPI 81 year old female patient of Dr. Silvio Pate with history of likely osteoporosis, osteoarthritis, CVA and PHN on morphine presents with new onset pain, swelling and redness in last 3-4 days.  She feels she hit foot on corner of bed  in last week, not sure how. Noted scab on right lateral ankle ( at lateral malleolus).  Pain extends from lateral toes and up to mid calf.   She has iced it, soaked in epsom salt.    Pt is poor historian. Social History /Family History/Past Medical History reviewed and updated if needed.    Review of Systems  Constitutional: Negative for fatigue.  HENT: Negative for ear pain.   Eyes: Negative for pain.  Respiratory: Negative for shortness of breath.   Cardiovascular: Negative for chest pain.       Objective:   Physical Exam  Constitutional: Vital signs are normal. She appears well-developed and well-nourished. She is cooperative.  Non-toxic appearance. She does not appear ill. No distress.  HENT:  Head: Normocephalic.  Right Ear: Hearing, tympanic membrane, external ear and ear canal normal. Tympanic membrane is not erythematous, not retracted and not bulging.  Left Ear: Hearing, tympanic membrane, external ear and ear canal normal. Tympanic membrane is not erythematous, not retracted and not bulging.  Nose: No mucosal edema or rhinorrhea. Right sinus exhibits no maxillary sinus tenderness and no frontal sinus tenderness. Left sinus exhibits no maxillary sinus tenderness and no frontal sinus tenderness.  Mouth/Throat: Uvula is midline, oropharynx is clear and moist and mucous membranes are normal.  Eyes: Conjunctivae, EOM and lids are normal. Pupils are equal, round, and reactive to light. Lids are everted and swept, no foreign bodies found.  Neck: Trachea normal and normal range of motion. Neck supple. Carotid bruit is not present. No thyroid mass and no  thyromegaly present.  Cardiovascular: Normal rate, regular rhythm, S1 normal, S2 normal, normal heart sounds, intact distal pulses and normal pulses.  Exam reveals no gallop and no friction rub.   No murmur heard. Pulmonary/Chest: Effort normal and breath sounds normal. No tachypnea. No respiratory distress. She has no decreased breath sounds. She has no wheezes. She has no rhonchi. She has no rales.  Abdominal: Soft. Normal appearance and bowel sounds are normal. There is no tenderness.  Musculoskeletal:  ttp over right lateral malleolus and posterior achilles. Swelling in lateral ankle, no tender ant and inf to malleolus.  Decreased ROM due to pain.   Neurological: She is alert.  Skin: Skin is warm, dry and intact. No rash noted.     Erythema and slight increase warmth surrounding scab, see picture.  Psychiatric: Her speech is normal and behavior is normal. Judgment and thought content normal. Her mood appears not anxious. Cognition and memory are normal. She does not exhibit a depressed mood.          Assessment & Plan:

## 2015-01-27 NOTE — Assessment & Plan Note (Signed)
Focal ttp over malleolus area or scab.  Will eval with X-ray given age and likely osteoporosis. Given redness and heat near scab.[name] Possible cellulitis. If no fracture. Treat with keflex x 7 days.

## 2015-01-27 NOTE — Addendum Note (Signed)
Addended byEliezer Lofts E on: 01/27/2015 04:10 PM   Modules accepted: Orders

## 2015-01-27 NOTE — Progress Notes (Signed)
Pre visit review using our clinic review tool, if applicable. No additional management support is needed unless otherwise documented below in the visit note. 

## 2015-02-09 ENCOUNTER — Other Ambulatory Visit: Payer: Self-pay | Admitting: Internal Medicine

## 2015-02-09 NOTE — Telephone Encounter (Signed)
Last filled 01/18/2015 #30--please advise if okay to refill

## 2015-02-10 NOTE — Telephone Encounter (Signed)
Approved: okay #[phone] x 0 

## 2015-02-10 NOTE — Telephone Encounter (Signed)
rx called into pharmacy

## 2015-02-24 ENCOUNTER — Other Ambulatory Visit: Payer: Self-pay | Admitting: Internal Medicine

## 2015-02-25 NOTE — Telephone Encounter (Signed)
Spoke with patient and advised rx ready for pick-up and it will be at the front desk.  

## 2015-02-25 NOTE — Telephone Encounter (Signed)
01/25/2015 

## 2015-03-10 ENCOUNTER — Ambulatory Visit (INDEPENDENT_AMBULATORY_CARE_PROVIDER_SITE_OTHER): Payer: Commercial Managed Care - HMO | Admitting: Internal Medicine

## 2015-03-10 ENCOUNTER — Encounter: Payer: Self-pay | Admitting: Internal Medicine

## 2015-03-10 VITALS — BP 120/60 | HR 60 | Temp 98.3°F | Wt 91.0 lb

## 2015-03-10 DIAGNOSIS — B0229 Other postherpetic nervous system involvement: Secondary | ICD-10-CM | POA: Diagnosis not present

## 2015-03-10 DIAGNOSIS — M25571 Pain in right ankle and joints of right foot: Secondary | ICD-10-CM | POA: Diagnosis not present

## 2015-03-10 DIAGNOSIS — Z23 Encounter for immunization: Secondary | ICD-10-CM | POA: Diagnosis not present

## 2015-03-10 MED ORDER — MORPHINE SULFATE 15 MG PO TABS: 15.0000 mg | ORAL_TABLET | ORAL | Status: DC | PRN

## 2015-03-10 NOTE — Assessment & Plan Note (Signed)
Persists Seems to just be sprain Discussed better support X-ray was negative (though incomplete for ankle---but benign exam)

## 2015-03-10 NOTE — Assessment & Plan Note (Signed)
Worsened pain Morphine helps but doesn't hold her Will increase to 4 times a day

## 2015-03-10 NOTE — Addendum Note (Signed)
Addended by: Despina Hidden on: 03/10/2015 05:39 PM   Modules accepted: Orders

## 2015-03-10 NOTE — Progress Notes (Signed)
Pre visit review using our clinic review tool, if applicable. No additional management support is needed unless otherwise documented below in the visit note. 

## 2015-03-10 NOTE — Progress Notes (Signed)
Subjective:    Patient ID: [name], female    DOB: 07-Aug-1927, 81 y.o.   MRN: [id number]  HPI Here with daughter  Still having bad pain on right temple Gets sense of big knot up there Does get some relief from the morphine--but it runs out  Also, still in pain in right ankle If she moves it the wrong way, etc Able to have full weight bearing though In flip flops now---states she wears better shoes  Current Outpatient Prescriptions on File Prior to Visit  Medication Sig Dispense Refill  . acyclovir (ZOVIRAX) 800 MG tablet TAKE ONE TABLET BY MOUTH FOUR TIMES DAILY 120 tablet 11  . aspirin 81 MG tablet Take 81 mg by mouth daily.      . fluticasone (FLONASE) 50 MCG/ACT nasal spray Place 2 sprays into both nostrils daily.    [name] LORazepam (ATIVAN) 1 MG tablet TAKE 1/2 TO 1 TABLET BY MOUTH EVERY NIGHT AT BEDTIME 30 tablet 0  . morphine (MSIR) 15 MG tablet TAKE 1 TABLET BY MOUTH 3 TIMES A DAY AS NEEDED FOR SEVERE PAIN 90 tablet 0  . Multiple Vitamin (MULTIVITAMIN) tablet Take 1 tablet by mouth daily.    . pantoprazole (PROTONIX) 40 MG tablet TAKE 1 TABLET BY MOUTH TWICE A DAY 60 tablet 5  . topiramate (TOPAMAX) 100 MG tablet Take 1 tablet (100 mg total) by mouth at bedtime.     No current facility-administered medications on file prior to visit.    Allergies  Allergen Reactions  . Duloxetine     REACTION: N/V Mental status change and trouble with balance  . Omeprazole     REACTION: Didn't work  . Pregabalin     REACTION: SOB, swollen lips    Past Medical History  Diagnosis Date  . Breast cancer     Bilateral mastectomies 1986.  [name] GERD (gastroesophageal reflux disease)   . Hyperlipidemia   . Arthritis   . Depression   . Zoster      with Post-herpetic neuralgia  . History of partial thyroidectomy   . Dyspnea     Echo (5/11) was a difficult study due to breast implants but showed normal LV and RV size and systolic function.      [name] Atypical chest pain     Lexiscan  myoview (5/11) with EF 84%, normal wall motion, small fixed apical  perfusion defect likely due to breast attenuation, no evidence for ischemia or infarction.  **Patient had an  . CVA (cerebral infarction) 10/12    right lacunar  . Hypertension   . Obstruction of intestine   . Partial small bowel obstruction 7/14    no surgery    Past Surgical History  Procedure Laterality Date  . Abdominal hysterectomy    . Mastectomy  1980    bilateral  . Vaginal delivery      No family history on file.  Social History   Social History  . Marital Status: Widowed    Spouse Name: N/A  . Number of Children: 2  . 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 Smoking status: Never Smoker   . Smokeless tobacco: Never Used  . Alcohol Use: No  . Drug Use: No  . Sexual Activity: Not on file   Other Topics Concern  . Not on file   Social History Narrative   No living will   No health care  POA but requests daughter-in-law [name] to do this   Would like attempts at resuscitation   No feeding tube if cognitively unaware   Review of Systems  Sleeps okay Eating okay--weight up slightly     Objective:   Physical Exam  Constitutional: No distress.  HENT:  No right V1 lesions now but very sensitive  Musculoskeletal:  Right ankle is somewhat stiff Full weight bearing without problems and normal gait Very mild tenderness along lateral malleolus on right          Assessment & Plan:

## 2015-03-15 ENCOUNTER — Other Ambulatory Visit: Payer: Self-pay | Admitting: Internal Medicine

## 2015-03-15 NOTE — Telephone Encounter (Signed)
02/10/2015 

## 2015-03-16 NOTE — Telephone Encounter (Signed)
Approved: okay #[phone] x 0 

## 2015-03-16 NOTE — Telephone Encounter (Signed)
rx called into pharmacy

## 2015-04-13 ENCOUNTER — Other Ambulatory Visit: Payer: Self-pay | Admitting: Internal Medicine

## 2015-04-13 NOTE — Telephone Encounter (Signed)
03/16/2015 

## 2015-04-13 NOTE — Telephone Encounter (Signed)
rx called into pharmacy

## 2015-04-13 NOTE — Telephone Encounter (Signed)
Approved: 30 x 0 

## 2015-05-09 ENCOUNTER — Other Ambulatory Visit: Payer: Self-pay | Admitting: Internal Medicine

## 2015-05-09 NOTE — Telephone Encounter (Signed)
03/10/2015 

## 2015-05-09 NOTE — Telephone Encounter (Signed)
Spoke with patient and advised rx ready for pick-up and it will be at the front desk. Her son will pick-up

## 2015-05-11 ENCOUNTER — Other Ambulatory Visit: Payer: Self-pay | Admitting: Internal Medicine

## 2015-05-11 NOTE — Telephone Encounter (Signed)
Approved: 30 x 0 

## 2015-05-11 NOTE — Telephone Encounter (Signed)
rx called into pharmacy

## 2015-05-11 NOTE — Telephone Encounter (Signed)
04/13/2015 

## 2015-05-19 ENCOUNTER — Encounter: Payer: Self-pay | Admitting: *Deleted

## 2015-05-20 ENCOUNTER — Ambulatory Visit (INDEPENDENT_AMBULATORY_CARE_PROVIDER_SITE_OTHER): Payer: Commercial Managed Care - HMO | Admitting: Internal Medicine

## 2015-05-20 ENCOUNTER — Encounter: Payer: Self-pay | Admitting: Internal Medicine

## 2015-05-20 VITALS — BP 110/70 | HR 90 | Temp 97.4°F | Wt 93.0 lb

## 2015-05-20 DIAGNOSIS — R300 Dysuria: Secondary | ICD-10-CM | POA: Diagnosis not present

## 2015-05-20 DIAGNOSIS — J301 Allergic rhinitis due to pollen: Secondary | ICD-10-CM

## 2015-05-20 NOTE — Progress Notes (Signed)
Subjective:    Patient ID: [name], female    DOB: 02-Nov-1927, 81 y.o.   MRN: [id number]  HPI Here due to several health concerns  Has noticed her temporal veins being more prominent Feels it is "dripping" down into her ears and throat Has post nasal drip No problems with allergies in past Sore under her chin also---relates to the drainage Eyes closed with gunk every morning Ears are sore--post auricular and down neck Uses the fluticasone daily  Still having the sensitivity in right temple  Slight urinary burning for 2 weeks Orange, dark color Knows she doesn't drink enough water  Current Outpatient Prescriptions on File Prior to Visit  Medication Sig Dispense Refill  . acyclovir (ZOVIRAX) 800 MG tablet TAKE ONE TABLET BY MOUTH FOUR TIMES DAILY 120 tablet 11  . aspirin 81 MG tablet Take 81 mg by mouth daily.      . fluticasone (FLONASE) 50 MCG/ACT nasal spray Place 2 sprays into both nostrils daily.    [name] LORazepam (ATIVAN) 1 MG tablet TAKE 1/2 TO 1 TABLET BY MOUTH EVERY NIGHT AT BEDTIME 30 tablet 0  . morphine (MSIR) 15 MG tablet TAKE 1 TABLET BY MOUTH EVERY 4 AS NEEDEDFOR SEVERE PAIN 120 tablet 0  . Multiple Vitamin (MULTIVITAMIN) tablet Take 1 tablet by mouth daily.    . pantoprazole (PROTONIX) 40 MG tablet TAKE 1 TABLET BY MOUTH TWICE A DAY 60 tablet 11  . topiramate (TOPAMAX) 100 MG tablet Take 1 tablet (100 mg total) by mouth at bedtime. (Patient taking differently: Take 50 mg by mouth at bedtime. )     No current facility-administered medications on file prior to visit.    Allergies  Allergen Reactions  . Duloxetine     REACTION: N/V Mental status change and trouble with balance  . Omeprazole     REACTION: Didn't work  . Pregabalin     REACTION: SOB, swollen lips    Past Medical History  Diagnosis Date  . Breast cancer ([place])     Bilateral mastectomies 1986.  [name] GERD (gastroesophageal reflux disease)   . Hyperlipidemia   . Arthritis   . Depression     . Zoster      with Post-herpetic neuralgia  . History of partial thyroidectomy   . Dyspnea     Echo (5/11) was a difficult study due to breast implants but showed normal LV and RV size and systolic function.      [name] Atypical chest pain     Lexiscan myoview (5/11) with EF 84%, normal wall motion, small fixed apical  perfusion defect likely due to breast attenuation, no evidence for ischemia or infarction.  **Patient had an  . CVA (cerebral infarction) 10/12    right lacunar  . Hypertension   . Obstruction of intestine ([place])   . Partial small bowel obstruction ([place]) 7/14    no surgery    Past Surgical History  Procedure Laterality Date  . Abdominal hysterectomy    . Mastectomy  1980    bilateral  . Vaginal delivery      No family history on file.  Social History   Social History  . Marital Status: Widowed    Spouse Name: N/A  . Number of Children: 2  . 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 Smoking status: Never Smoker   . Smokeless tobacco: Never Used  . Alcohol Use: No  .  Drug Use: No  . Sexual Activity: Not on file   Other Topics Concern  . Not on file   Social History Narrative   No living will   No health care POA but requests daughter-in-law [name] to do this   Would like attempts at resuscitation   No feeding tube if cognitively unaware   Review of Systems Notes mild calf swelling--just indentations from socks Ankle is better with improved shoes    Objective:   Physical Exam  HENT:  Mouth/Throat: Oropharynx is clear and moist. No oropharyngeal exudate.  Right maxilla and temple still exquisitely sensitive Moderate pale nasal congestion TMs normal  Neck: Normal range of motion. Neck supple.  Pulmonary/Chest: Effort normal and breath sounds normal. No respiratory distress. She has no wheezes. She has no rales.  Musculoskeletal:  No sig edema--just indentations from socks   Lymphadenopathy:    She has no cervical adenopathy.          Assessment & Plan:

## 2015-05-20 NOTE — Progress Notes (Signed)
Pre visit review using our clinic review tool, if applicable. No additional management support is needed unless otherwise documented below in the visit note. 

## 2015-05-20 NOTE — Assessment & Plan Note (Signed)
May just have concentrated urine Will have her increase fluids (couldn't give specimen here) 3 days antibiotics if recurs

## 2015-05-20 NOTE — Patient Instructions (Signed)
Please increase the fluticasone spray to 2 sprays in each nostril twice a day --till the drainage is better. Increase your fluid intake as much as possible---and if the burning comes back, I will send an antibiotic prescription next week.

## 2015-05-20 NOTE — Assessment & Plan Note (Signed)
Probably mold sensitive and seems to be the reason for the ear and drainage symptoms Will have her increase fluticasone for now Add evening cetirizine

## 2015-05-31 ENCOUNTER — Ambulatory Visit: Payer: Commercial Managed Care - HMO

## 2015-05-31 ENCOUNTER — Ambulatory Visit (INDEPENDENT_AMBULATORY_CARE_PROVIDER_SITE_OTHER): Payer: Commercial Managed Care - HMO | Admitting: General Surgery

## 2015-05-31 ENCOUNTER — Encounter: Payer: Self-pay | Admitting: General Surgery

## 2015-05-31 VITALS — BP 144/70 | HR 80 | Resp 14 | Ht 66.0 in | Wt 94.0 lb

## 2015-05-31 DIAGNOSIS — I6529 Occlusion and stenosis of unspecified carotid artery: Secondary | ICD-10-CM | POA: Diagnosis not present

## 2015-05-31 NOTE — Patient Instructions (Signed)
Follow up in one year with carotid ultrasound if she desires monitor for stroke symptoms

## 2015-05-31 NOTE — Progress Notes (Signed)
Patient ID: [name], female   DOB: 1928/01/14, 81 y.o.   MRN: [id number]  Chief Complaint  Patient presents with  . Follow-up    carotid ultrasound    HPI [name] is a 81 y.o. female here today for a carotid ultrasound. Patient states she is doing well. Recovering from a sinus infection but otherwise doing well. Her headaches are related to her history of shingles.Denies any sudden,transient loss of function in arms,legs, face or speech. I have reviewed the history of present illness with the patient. HPI  Past Medical History  Diagnosis Date  . Breast cancer ([place])     Bilateral mastectomies 1986.  [name] GERD (gastroesophageal reflux disease)   . Hyperlipidemia   . Arthritis   . Depression   . Zoster      with Post-herpetic neuralgia  . History of partial thyroidectomy   . Dyspnea     Echo (5/11) was a difficult study due to breast implants but showed normal LV and RV size and systolic function.      [name] Atypical chest pain     Lexiscan myoview (5/11) with EF 84%, normal wall motion, small fixed apical  perfusion defect likely due to breast attenuation, no evidence for ischemia or infarction.  **Patient had an  . CVA (cerebral infarction) 10/12    right lacunar  . Hypertension   . Obstruction of intestine ([place])   . Partial small bowel obstruction ([place]) 7/14    no surgery    Past Surgical History  Procedure Laterality Date  . Abdominal hysterectomy    . Mastectomy  1980    bilateral  . Vaginal delivery      History reviewed. No pertinent family history.  Social History Social History  Substance Use Topics  . Smoking status: Never Smoker   . Smokeless tobacco: Never Used  . Alcohol Use: No    Allergies  Allergen Reactions  . Duloxetine     REACTION: N/V Mental status change and trouble with balance  . Omeprazole     REACTION: Didn't work  . Pregabalin     REACTION: SOB, swollen lips    Current Outpatient Prescriptions  Medication Sig Dispense  Refill  . acyclovir (ZOVIRAX) 800 MG tablet TAKE ONE TABLET BY MOUTH FOUR TIMES DAILY 120 tablet 11  . aspirin 81 MG tablet Take 81 mg by mouth daily.      . fluticasone (FLONASE) 50 MCG/ACT nasal spray Place 2 sprays into both nostrils daily.    [name] LORazepam (ATIVAN) 1 MG tablet TAKE 1/2 TO 1 TABLET BY MOUTH EVERY NIGHT AT BEDTIME 30 tablet 0  . morphine (MSIR) 15 MG tablet TAKE 1 TABLET BY MOUTH EVERY 4 AS NEEDEDFOR SEVERE PAIN 120 tablet 0  . Multiple Vitamin (MULTIVITAMIN) tablet Take 1 tablet by mouth daily.    . pantoprazole (PROTONIX) 40 MG tablet TAKE 1 TABLET BY MOUTH TWICE A DAY 60 tablet 11  . topiramate (TOPAMAX) 100 MG tablet Take 1 tablet (100 mg total) by mouth at bedtime. (Patient taking differently: Take 50 mg by mouth at bedtime. )     No current facility-administered medications for this visit.    Review of Systems Review of Systems  Respiratory: Negative.   Cardiovascular: Negative.   Neurological: Positive for headaches. Negative for dizziness.    Blood pressure 144/70, pulse 80, resp. rate 14, height 5\' 6"  (1.676 m), weight 94 lb (42.638 kg).  Physical Exam Physical Exam  Constitutional: She is oriented to  person, place, and time. She appears well-developed and well-nourished.  Eyes: Conjunctivae are normal. No scleral icterus.  Neck: Neck supple.  Cardiovascular: Normal rate, regular rhythm and normal heart sounds.   Lymphadenopathy:    She has no cervical adenopathy.  Neurological: She is alert and oriented to person, place, and time.  Skin: Skin is warm and dry.  Psychiatric: Her behavior is normal.    Data Reviewed Notes reviewed Duplex scan today showed stabble plaquing in right carotid with about a 50% stenosis, intimal thickening on both sides. Assessment    Stable moderate right carotid plaque asymptomatic    Plan    Follow up in one year with carotid ultrasound if she desires.      PCP:  Reche Dixon  G 06/08/2015, 5:28 PM

## 2015-06-08 ENCOUNTER — Encounter: Payer: Self-pay | Admitting: General Surgery

## 2015-06-13 ENCOUNTER — Ambulatory Visit: Payer: Commercial Managed Care - HMO | Admitting: Internal Medicine

## 2015-06-13 ENCOUNTER — Other Ambulatory Visit: Payer: Self-pay | Admitting: Internal Medicine

## 2015-06-13 NOTE — Telephone Encounter (Signed)
rx called into pharmacy

## 2015-06-13 NOTE — Telephone Encounter (Signed)
Approved: 30 x 0 

## 2015-06-13 NOTE — Telephone Encounter (Signed)
05/11/2015 

## 2015-06-14 ENCOUNTER — Other Ambulatory Visit: Payer: Self-pay | Admitting: Internal Medicine

## 2015-06-14 NOTE — Telephone Encounter (Signed)
Spoke with patient and advised rx ready for pick-up and it will be at the front desk.  

## 2015-06-14 NOTE — Telephone Encounter (Signed)
05/09/2015 

## 2015-06-23 ENCOUNTER — Ambulatory Visit: Payer: Self-pay | Admitting: General Surgery

## 2015-07-12 ENCOUNTER — Other Ambulatory Visit: Payer: Self-pay | Admitting: Internal Medicine

## 2015-07-12 NOTE — Telephone Encounter (Signed)
Received refill request electronically Last refill 06/13/15 #30 Last office visit 05/20/15 Is it okay to refill?

## 2015-07-13 NOTE — Telephone Encounter (Signed)
Rx called in as directed.   

## 2015-07-13 NOTE — Telephone Encounter (Signed)
plz phone in. 

## 2015-07-25 ENCOUNTER — Other Ambulatory Visit: Payer: Self-pay | Admitting: Internal Medicine

## 2015-07-25 NOTE — Telephone Encounter (Signed)
Spoke with patient and advised rx ready for pick-up and it will be at the front desk.  

## 2015-07-25 NOTE — Telephone Encounter (Signed)
06/14/15 

## 2015-07-28 DIAGNOSIS — H16231 Neurotrophic keratoconjunctivitis, right eye: Secondary | ICD-10-CM | POA: Diagnosis not present

## 2015-08-10 ENCOUNTER — Encounter: Payer: Self-pay | Admitting: *Deleted

## 2015-08-12 ENCOUNTER — Encounter: Payer: Self-pay | Admitting: Internal Medicine

## 2015-08-12 ENCOUNTER — Ambulatory Visit (INDEPENDENT_AMBULATORY_CARE_PROVIDER_SITE_OTHER): Payer: PPO | Admitting: Internal Medicine

## 2015-08-12 VITALS — BP 148/70 | HR 82 | Temp 98.4°F | Wt 94.0 lb

## 2015-08-12 DIAGNOSIS — R42 Dizziness and giddiness: Secondary | ICD-10-CM | POA: Insufficient documentation

## 2015-08-12 DIAGNOSIS — E538 Deficiency of other specified B group vitamins: Secondary | ICD-10-CM | POA: Diagnosis not present

## 2015-08-12 LAB — CBC WITH DIFFERENTIAL/PLATELET
BASOS ABS: 0 10*3/uL (ref 0.0–0.1)
Basophils Relative: 0.4 % (ref 0.0–3.0)
EOS ABS: 0 10*3/uL (ref 0.0–0.7)
EOS PCT: 0.4 % (ref 0.0–5.0)
HCT: 38.5 % (ref 36.0–46.0)
HEMOGLOBIN: 12.2 g/dL (ref 12.0–15.0)
Lymphocytes Relative: 19.1 % (ref 12.0–46.0)
Lymphs Abs: 1.3 10*3/uL (ref 0.7–4.0)
MCHC: 31.8 g/dL (ref 30.0–36.0)
MCV: 93.8 fl (ref 78.0–100.0)
MONO ABS: 0.5 10*3/uL (ref 0.1–1.0)
Monocytes Relative: 7.8 % (ref 3.0–12.0)
Neutro Abs: 5.1 10*3/uL (ref 1.4–7.7)
Neutrophils Relative %: 72.3 % (ref 43.0–77.0)
Platelets: 147 10*3/uL — ABNORMAL LOW (ref 150.0–400.0)
RBC: 4.11 Mil/uL (ref 3.87–5.11)
RDW: 14.8 % (ref 11.5–15.5)
WBC: 7 10*3/uL (ref 4.0–10.5)

## 2015-08-12 LAB — SEDIMENTATION RATE: SED RATE: 13 mm/h (ref 0–22)

## 2015-08-12 LAB — COMPREHENSIVE METABOLIC PANEL
ALK PHOS: 66 U/L (ref 39–117)
ALT: 11 U/L (ref 0–35)
AST: 16 U/L (ref 0–37)
Albumin: 4.2 g/dL (ref 3.5–5.2)
BILIRUBIN TOTAL: 0.5 mg/dL (ref 0.2–1.2)
BUN: 16 mg/dL (ref 6–23)
CO2: 29 meq/L (ref 19–32)
Calcium: 9.4 mg/dL (ref 8.4–10.5)
Chloride: 105 mEq/L (ref 96–112)
Creatinine, Ser: 0.83 mg/dL (ref 0.40–1.20)
GFR: 69.27 mL/min (ref >60.00)
GLUCOSE: 105 mg/dL — AB (ref 70–99)
Potassium: 3.8 mEq/L (ref 3.5–5.1)
SODIUM: 141 meq/L (ref 135–145)
TOTAL PROTEIN: 7.4 g/dL (ref 6.0–8.3)

## 2015-08-12 LAB — T4, FREE: FREE T4: 1 ng/dL (ref 0.60–1.60)

## 2015-08-12 LAB — VITAMIN B12: VITAMIN B 12: 353 pg/mL (ref 211–911)

## 2015-08-12 MED ORDER — LORAZEPAM 1 MG PO TABS: 0.5000 mg | ORAL_TABLET | Freq: Every day | ORAL | Status: DC

## 2015-08-12 NOTE — Progress Notes (Signed)
Pre visit review using our clinic review tool, if applicable. No additional management support is needed unless otherwise documented below in the visit note. 

## 2015-08-12 NOTE — Assessment & Plan Note (Signed)
Not clearly vestibular and no signs of stroke Could be multifactorial Has pain with chewing but in neck---doesn't go along with temporal arteritis Chronic right head and ear symptoms make it hard to tell just what is going on Will check labs Reassured no stroke Urged her to increase fluids

## 2015-08-12 NOTE — Progress Notes (Signed)
Subjective:    Patient ID: [name], female    DOB: 05/28/1928, 82 y.o.   MRN: [id number]  HPI Here due to left arm pain  Slid down due to dizziness Both ears bothering her and chest feels bad. Chewing hurts on both sides. Neck bothering her Started 2-3 days ago Knots and pain in PHN area also  Hit left arm/elbow on coffee table 3 days ago Did try ice and topical cream on it (antibiotic). Washed with peroxide  Mostly worried about the dizziness Bad headache Eye stay blurry  Current Outpatient Prescriptions on File Prior to Visit  Medication Sig Dispense Refill  . acyclovir (ZOVIRAX) 800 MG tablet TAKE ONE TABLET BY MOUTH FOUR TIMES DAILY 120 tablet 11  . aspirin 81 MG tablet Take 81 mg by mouth daily.      . Bepotastine Besilate (BEPREVE OP) Apply to eye 4 (four) times daily.    . fluticasone (FLONASE) 50 MCG/ACT nasal spray Place 2 sprays into both nostrils daily.    [name] LORazepam (ATIVAN) 1 MG tablet TAKE 1/2 TO 1 TABLET BY MOUTH EVERY NIGHT AT BEDTIME 30 tablet 0  . morphine (MSIR) 15 MG tablet TAKE 1 TABLET BY MOUTH EVERY 4 HOURS AS NEEDED FOR SEVERE PAIN 120 tablet 0  . pantoprazole (PROTONIX) 40 MG tablet TAKE 1 TABLET BY MOUTH TWICE A DAY 60 tablet 11  . topiramate (TOPAMAX) 100 MG tablet Take 1 tablet (100 mg total) by mouth at bedtime. (Patient taking differently: Take 50 mg by mouth at bedtime. )     No current facility-administered medications on file prior to visit.    Allergies  Allergen Reactions  . Duloxetine     REACTION: N/V Mental status change and trouble with balance  . Cymbalta [Duloxetine Hcl]     Unsure of reaction  . Maxitrol [Neomycin-Polymyxin-Dexameth]     Unsure of reaction  . Omeprazole     REACTION: Didn't work  . Pregabalin     REACTION: SOB, swollen lips  . Tobradex [Tobramycin-Dexamethasone]     Unsure of reaction    Past Medical History  Diagnosis Date  . Breast cancer ([place])     Bilateral mastectomies 1986.  [name] GERD  (gastroesophageal reflux disease)   . Hyperlipidemia   . Arthritis   . Depression   . Zoster      with Post-herpetic neuralgia  . History of partial thyroidectomy   . Dyspnea     Echo (5/11) was a difficult study due to breast implants but showed normal LV and RV size and systolic function.      [name] Atypical chest pain     Lexiscan myoview (5/11) with EF 84%, normal wall motion, small fixed apical  perfusion defect likely due to breast attenuation, no evidence for ischemia or infarction.  **Patient had an  . CVA (cerebral infarction) 10/12    right lacunar  . Obstruction of intestine ([place])   . Partial small bowel obstruction ([place]) 7/14    no surgery  . Hypertension     in past - no current meds/issues  . Wears dentures     full upper  . Headache     s/p shingles - right side of head    Past Surgical History  Procedure Laterality Date  . Abdominal hysterectomy    . Mastectomy  1980    bilateral  . Vaginal delivery    . Neck surgery      No family history on file.  Social History   Social History  . Marital Status: Widowed    Spouse Name: N/A  . Number of Children: 2  . 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 Smoking status: Never Smoker   . Smokeless tobacco: Never Used  . Alcohol Use: No  . Drug Use: No  . Sexual Activity: Not on file   Other Topics Concern  . Not on file   Social History Narrative   No living will   No health care POA but requests daughter-in-law [name] to do this   Would like attempts at resuscitation   No feeding tube if cognitively unaware   Review of Systems Due to have right eye sewed shut again Appetite is fair Steady with the morphine-- 3-4 daily    Objective:   Physical Exam  HENT:  Mouth/Throat: Oropharynx is clear and moist. No oropharyngeal exudate.  TMs fine but right ear very sensitive No temporal bruits Chewing pain is actually by hyoid  Eyes: EOM are  normal. Pupils are equal, round, and reactive to light.  No nystagmus  Cardiovascular: Normal rate, regular rhythm and normal heart sounds.  Exam reveals no gallop.   No murmur heard. Pulmonary/Chest: Effort normal and breath sounds normal. No respiratory distress. She has no wheezes. She has no rales.  Neurological: No cranial nerve deficit. She displays a negative Romberg sign. Coordination and gait normal.          Assessment & Plan:

## 2015-08-12 NOTE — Assessment & Plan Note (Signed)
She is unaware of this Could cause the dizziness Will recheck this level also

## 2015-08-15 ENCOUNTER — Other Ambulatory Visit: Payer: Self-pay | Admitting: Internal Medicine

## 2015-08-15 NOTE — Discharge Instructions (Signed)
INSTRUCTIONS FOLLOWING OCULOPLASTIC SURGERY °AMY M. FOWLER, MD ° °AFTER YOUR EYE SURGERY, THER ARE MANY THINGS THWIHC YOU, THE PATIENT, CAN DO TO ASSURE THE BEST POSSIBLE RESULT FROM YOUR OPERATION.  THIS SHEET SHOULD BE REFERRED TO WHENEVER QUESTIONS ARISE.  IF THERE ARE ANY QUESTIONS NOT ANSWERED HERE, DO NOT HESITATE TO CALL OUR OFFICE AT [phone] OR [phone].  THERE IS ALWAYS [facility] AVAILABLE TO CALL IF QUESTIONS OR PROBLEMS ARISE. ° °VISION: Your vision may be blurred and out of focus after surgery until you are able to stop using your ointment, swelling resolves and your eye(s) heal. This may take 1 to 2 weeks at the least.  If your vision becomes gradually more dim or dark, this is not normal and you need to call our office immediately. ° °EYE CARE: For the first 48 hours after surgery, use ice packs frequently - “20 minutes on, 20 minutes off” - to help reduce swelling and bruising.  Small bags of frozen peas or corn make good ice packs along with cloths soaked in ice water.  If you are wearing a patch or other type of dressing following surgery, keep this on for the amount of time specified by your doctor.  For the first week following surgery, you will need to treat your stitches with great care.  If is OK to shower, but take care to not allow soapy water to run into your eye(s) to help reduce changes of infection.  You may gently clean the eyelashes and around the eye(s) with cotton balls and sterile water, BUT DO NOT RUB THE STITCHES VIGOROUSLY.  Keeping your stitches moist with ointment will help promote healing with minimal scar formation. ° °ACTIVITY: When you leave the surgery center, you should go home, rest and be inactive.  The eye(s) may feel scratchy and keeping the eyes closed will allow for faster healing.  The first week following surgery, avoid straining (anything making the face turn red) or lifting over 20 pounds.  Additionally, avoid bending which causes your head to go below  your waist.  Using your eyes will NOT harm them, so feel free to read, watch television, use the computer, etc as desired.  Driving depends on each individual, so check with your doctor if you have questions about driving. ° °MEDICATIONS:  You will be given a prescription for an ointment to use 4 times a day on your stitches.  You can use the ointment in your eyes if they feel scratchy or irritated.  If you eyelid(s) don’t close completely when you sleep, put some ointment in your eyes before bedtime. ° °EMERGENCY: If you experience SEVERE EYE PAIN OR HEADACHE UNRELIEVED BY TYLENOL OR PERCOCET, NAUSEA OR VOMITING, WORSENING REDNESS, OR WORSENING VISION (ESPECIALLY VISION THAT WA INITIALLY BETTER) CALL [phone] OR [phone] DURING BUSINESS HOURS OR AFTER HOURS. ° °General Anesthesia, Adult, Care After °Refer to this sheet in the next few weeks. These instructions provide you with information on caring for yourself after your procedure. Your health care provider may also give you more specific instructions. Your treatment has been planned according to current medical practices, but problems sometimes occur. Call your health care provider if you have any problems or questions after your procedure. °WHAT TO EXPECT AFTER THE PROCEDURE °After the procedure, it is typical to experience: °· Sleepiness. °· Nausea and vomiting. °HOME CARE INSTRUCTIONS °· For the first 24 hours after general anesthesia: °¨ Have a responsible person with you. °¨ Do not drive a car. If you   are alone, do not take public transportation. °¨ Do not drink alcohol. °¨ Do not take medicine that has not been prescribed by your health care provider. °¨ Do not sign important papers or make important decisions. °¨ You may resume a normal diet and activities as directed by your health care provider. °· Change bandages (dressings) as directed. °· If you have questions or problems that seem related to general anesthesia, call the hospital and ask for  the anesthetist or anesthesiologist on call. °SEEK MEDICAL CARE IF: °· You have nausea and vomiting that continue the day after anesthesia. °· You develop a rash. °SEEK IMMEDIATE MEDICAL CARE IF:  °· You have difficulty breathing. °· You have chest pain. °· You have any allergic problems. °  °This information is not intended to replace advice given to you by your health care provider. Make sure you discuss any questions you have with your health care provider. °  °Document Released: 10/01/2000 Document Revised: 07/16/2014 Document Reviewed: 10/24/2011 °Elsevier Interactive Patient Education ©2016 [employer] ° °

## 2015-08-16 ENCOUNTER — Ambulatory Visit
Admission: RE | Admit: 2015-08-16 | Discharge: 2015-08-16 | Disposition: A | Discharge status: Home / Self Care | Payer: PPO | Source: Ambulatory Visit | Attending: Ophthalmology | Admitting: Ophthalmology

## 2015-08-16 ENCOUNTER — Ambulatory Visit: Payer: PPO | Admitting: Anesthesiology

## 2015-08-16 ENCOUNTER — Encounter
Admission: RE | Disposition: A | Discharge status: Home / Self Care | Payer: Self-pay | Source: Ambulatory Visit | Attending: Ophthalmology

## 2015-08-16 DIAGNOSIS — H16231 Neurotrophic keratoconjunctivitis, right eye: Secondary | ICD-10-CM | POA: Insufficient documentation

## 2015-08-16 DIAGNOSIS — H16232 Neurotrophic keratoconjunctivitis, left eye: Secondary | ICD-10-CM | POA: Diagnosis not present

## 2015-08-16 HISTORY — PX: TARSORRHAPHY: SHX2484

## 2015-08-16 HISTORY — DX: Headache: R51

## 2015-08-16 HISTORY — DX: Headache, unspecified: R51.9

## 2015-08-16 HISTORY — DX: Presence of dental prosthetic device (complete) (partial): Z97.2

## 2015-08-16 SURGERY — MINOR TARSORRAPHY
Anesthesia: Monitor Anesthesia Care | Laterality: Bilateral | Wound class: Clean

## 2015-08-16 MED ORDER — LACTATED RINGERS IV SOLN
INTRAVENOUS | Status: DC
  Administered 2015-08-16: 11:00:00 via INTRAVENOUS

## 2015-08-16 MED ORDER — MIDAZOLAM HCL 2 MG/2ML IJ SOLN
INTRAMUSCULAR | Status: DC | PRN
  Administered 2015-08-16: 1 mg via INTRAVENOUS

## 2015-08-16 MED ORDER — ERYTHROMYCIN 5 MG/GM OP OINT
TOPICAL_OINTMENT | OPHTHALMIC | Status: DC | PRN
  Administered 2015-08-16: 1 via OPHTHALMIC

## 2015-08-16 MED ORDER — BUPIVACAINE HCL (PF) 0.5 % IJ SOLN
INTRAMUSCULAR | Status: DC | PRN
  Administered 2015-08-16: 4.5 mL via OPHTHALMIC

## 2015-08-16 MED ORDER — ERYTHROMYCIN 5 MG/GM OP OINT: TOPICAL_OINTMENT | OPHTHALMIC | Status: DC

## 2015-08-16 MED ORDER — LIDOCAINE HCL (CARDIAC) 20 MG/ML IV SOLN
INTRAVENOUS | Status: DC | PRN
  Administered 2015-08-16: 40 mg via INTRAVENOUS

## 2015-08-16 MED ORDER — LACTATED RINGERS IV SOLN: 500.0000 mL | INTRAVENOUS | Status: DC

## 2015-08-16 MED ORDER — ALFENTANIL 500 MCG/ML IJ INJ
INJECTION | INTRAMUSCULAR | Status: DC | PRN
  Administered 2015-08-16: 400 ug via INTRAVENOUS
  Administered 2015-08-16: 100 ug via INTRAVENOUS
  Administered 2015-08-16: 200 ug via INTRAVENOUS

## 2015-08-16 MED ORDER — OXYCODONE-ACETAMINOPHEN 5-325 MG PO TABS: 1.0000 | ORAL_TABLET | ORAL | Status: DC | PRN

## 2015-08-16 MED ORDER — TETRACAINE HCL 0.5 % OP SOLN
OPHTHALMIC | Status: DC | PRN
  Administered 2015-08-16: 2 [drp] via OPHTHALMIC

## 2015-08-16 MED ORDER — PROPOFOL 500 MG/50ML IV EMUL
INTRAVENOUS | Status: DC | PRN
  Administered 2015-08-16: 25 ug/kg/min via INTRAVENOUS

## 2015-08-16 SURGICAL SUPPLY — 23 items
APPLICATOR COTTON TIP WD 3 STR (MISCELLANEOUS) ×3 IMPLANT
BLADE SURG 15 STRL LF DISP TIS (BLADE) IMPLANT
BLADE SURG 15 STRL SS (BLADE)
DRAPE HEAD BAR (DRAPES) ×3 IMPLANT
GAUZE SPONGE 4X4 12PLY STRL (GAUZE/BANDAGES/DRESSINGS) ×3 IMPLANT
GAUZE SPONGE NON-WVN 2X2 STRL (MISCELLANEOUS) ×5 IMPLANT
GLOVE SURG LX 7.0 MICRO (GLOVE) ×4
GLOVE SURG LX STRL 7.0 MICRO (GLOVE) ×2 IMPLANT
MARKER SKIN XFINE TIP W/RULER (MISCELLANEOUS) ×3 IMPLANT
NEEDLE HYPO 30X.5 LL (NEEDLE) ×6 IMPLANT
PACK DRAPE NASAL/ENT (PACKS) ×3 IMPLANT
PACK EYE AFTER SURG (MISCELLANEOUS) ×3 IMPLANT
SOL BAL SALT 15ML (MISCELLANEOUS) ×3
SOL PREP PVP 2OZ (MISCELLANEOUS) ×3
SOLUTION BAL SALT 15ML (MISCELLANEOUS) ×1 IMPLANT
SOLUTION PREP PVP 2OZ (MISCELLANEOUS) ×1 IMPLANT
SPONGE VERSALON 2X2 STRL (MISCELLANEOUS) ×10
SUT PLAIN GUT (SUTURE) ×3 IMPLANT
SUT PROLENE 5 0 P 3 (SUTURE) IMPLANT
SUT PROLENE 6 0 P 1 18 (SUTURE) IMPLANT
SUT VICRYL 7 0 TG140 8 (SUTURE) ×3 IMPLANT
SYR 3ML LL SCALE MARK (SYRINGE) ×3 IMPLANT
WATER STERILE IRR 250ML POUR (IV SOLUTION) ×3 IMPLANT

## 2015-08-16 NOTE — H&P (Signed)
  See the history and physical performed at [facility] on 07/29/2015 that is scanned into the chart

## 2015-08-16 NOTE — Anesthesia Procedure Notes (Signed)
Procedure Name: MAC Performed by: Brecken Walth Pre-anesthesia Checklist: Patient identified, Emergency Drugs available, Suction available, Timeout performed and Patient being monitored Patient Re-evaluated:Patient Re-evaluated prior to inductionOxygen Delivery Method: Nasal cannula Placement Confirmation: positive ETCO2       

## 2015-08-16 NOTE — Interval H&P Note (Signed)
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 After consideration of risks, benefits and other options for treatment, the patient has consented to  Procedure(s): MINOR TARSORRAPHY LATERAL PLACEMENT (Bilateral) as a surgical intervention .  The patient's history has been reviewed, patient examined, no change in status, stable for surgery.  I have reviewed the patient's chart and labs.  Questions were answered to the patient's satisfaction.     Vickki Muff, Amy M

## 2015-08-16 NOTE — Op Note (Signed)
Preoperative Diagnosis:   1.  Exposure keratitis both eyes: Right greater than left   Postoperative Diagnosis:   Same.  Procedure(s) Performed:  1. 90% Lateral tarsorrhaphy with intramarginal adhesions, right eye 2.  Medial tarsorrhaphy with intramarginal adhesions left eye   Surgeon: Philis Pique. Vickki Muff, M.D.  Assistants: none  Anesthesia: MAC  Specimens: None.  Estimated Blood Loss: Minimal.  Complications: None.  Operative Findings: None   Procedure:   Allergies were reviewed and the patient Duloxetine; Cymbalta; Maxitrol; Omeprazole; Pregabalin; and Tobradex.    After discussing the risks, benefits, complications, and alternatives with the patient, appropriate informed consent was obtained. The patient was brought to the operating suite and reclined supine. Time out was conducted and the patient was sedated.  Local anesthetic consisting of a 50-50 mixture of 2% lidocaine with epinephrine and 0.75% bupivacaine with added Hylenex was injected subcutaneously to the right upper and lower eyelid(s). Additional anesthetic was injected subconjunctivally to the right upper and lower eyelid(s). Finally, anesthetic  was injected sub-conjunctivaly to the left medial canthal region  After adequate local was instilled, the patient was prepped and draped in the usual sterile fashion for eyelid surgery. Attention was turned to tright eye. A #11 blade was used to incise along the gray line of the upper and lower eyelid. The incision extended from the punctum of each eyelid to the lateral canthus. A strip of the epithelium was excised off the  margins of the tarsus.  Interrupted 6-0 Vicryl sutures were then passed partial thickness through the tarsus to approximate the margins of the tarsus. All suture ends were checked to make sure there were not abrading the cornea. These were then trimmed to the length of the lashes.   Attention was then turned to the medial portion of the left lid.  a #11 blade was  used to incise along the nuchal cutaneous border of the medial canthus distal to the punctum type. Skin was elevated anteriorly with Wescott scissors. A strip of conjunctiva was excised off the posterior incision margin to reveal the medial canthal tendons.  the canthal tendons were ligated with interrupted 7-0 Vicryl sutures providing medial closure with intramarginal adhesions. The skin was closed with interrupted 6-0 fast absorbing plain gut suture   The patient tolerated the procedure weerythromycin ophthalmicintment was applied to the incision site(s) followed by ice packs. The patient was taken to the recovery area where she recovered without difficulty.  Post-Op Plan/Instructions:  The patient was instructed to use ice packs frequently for the next 48 hours. She was instructed to uerythromycin ophthalmic ointment on her incisions 4 times a day for the next 12 to 14 days. She was given a prescription for Percocet for pain control should Tylenol not be effective. She was asked to to follow up at the [facility] in [place], [place] in 3-4 weeks' time or sooner as needed for problems.  Amy M. Vickki Muff, M.D. Attending,Ophthalmology

## 2015-08-16 NOTE — Transfer of Care (Signed)
Immediate Anesthesia Transfer of Care Note  Patient: [name]  Procedure(s) Performed: Procedure(s): MINOR TARSORRAPHY LATERAL PLACEMENT (Bilateral)  Patient Location: PACU  Anesthesia Type: MAC  Level of Consciousness: awake, alert  and patient cooperative  Airway and Oxygen Therapy: Patient Spontanous Breathing and Patient connected to supplemental oxygen  Post-op Assessment: Post-op Vital signs reviewed, Patient's Cardiovascular Status Stable, Respiratory Function Stable, Patent Airway and No signs of Nausea or vomiting  Post-op Vital Signs: Reviewed and stable  Complications: No apparent anesthesia complications

## 2015-08-16 NOTE — Anesthesia Preprocedure Evaluation (Addendum)
Anesthesia Evaluation  Patient identified by MRN, date of birth, ID band Patient awake    Reviewed: Allergy & Precautions, H&P , NPO status , Patient's Chart, lab work & pertinent test results, reviewed documented beta blocker date and time   Airway Mallampati: I  TM Distance: >3 FB Neck ROM: full    Dental no notable dental hx.    Pulmonary shortness of breath,    Pulmonary exam normal breath sounds clear to auscultation       Cardiovascular Exercise Tolerance: Good hypertension, negative cardio ROS   Rhythm:regular Rate:Normal     Neuro/Psych  Headaches, PSYCHIATRIC DISORDERS  Neuromuscular disease CVA, No Residual Symptoms    GI/Hepatic Neg liver ROS, Medicated,  Endo/Other  negative endocrine ROS  Renal/GU negative Renal ROS  negative genitourinary   Musculoskeletal   Abdominal   Peds  Hematology  (+) anemia ,   Anesthesia Other Findings   Reproductive/Obstetrics negative OB ROS                            Anesthesia Physical Anesthesia Plan  ASA: III  Anesthesia Plan: MAC   Post-op Pain Management:    Induction:   Airway Management Planned:   Additional Equipment:   Intra-op Plan:   Post-operative Plan:   Informed Consent: I have reviewed the patients History and Physical, chart, labs and discussed the procedure including the risks, benefits and alternatives for the proposed anesthesia with the patient or authorized representative who has indicated his/her understanding and acceptance.     Plan Discussed with: CRNA  Anesthesia Plan Comments:        Anesthesia Quick Evaluation

## 2015-08-16 NOTE — Anesthesia Postprocedure Evaluation (Signed)
Anesthesia Post Note  Patient: [name]  Procedure(s) Performed: Procedure(s) (LRB): MINOR TARSORRAPHY LATERAL PLACEMENT (Bilateral)  Patient location during evaluation: PACU Anesthesia Type: General Level of consciousness: awake and alert Pain management: pain level controlled Vital Signs Assessment: post-procedure vital signs reviewed and stable Respiratory status: spontaneous breathing, nonlabored ventilation and respiratory function stable Cardiovascular status: blood pressure returned to baseline and stable Postop Assessment: no signs of nausea or vomiting Anesthetic complications: no    Kareemah Grounds D Mishael Krysiak

## 2015-08-17 ENCOUNTER — Encounter: Payer: Self-pay | Admitting: Ophthalmology

## 2015-09-01 ENCOUNTER — Other Ambulatory Visit: Payer: Self-pay | Admitting: Internal Medicine

## 2015-09-02 NOTE — Telephone Encounter (Signed)
Spoke with patient and advised rx ready for pick-up and it will be at the front desk.  

## 2015-09-02 NOTE — Telephone Encounter (Signed)
07/25/15 

## 2015-09-12 ENCOUNTER — Other Ambulatory Visit: Payer: Self-pay | Admitting: Internal Medicine

## 2015-09-12 NOTE — Telephone Encounter (Signed)
Called in rx on voice mail at pharmacy 

## 2015-09-12 NOTE — Telephone Encounter (Signed)
Approved: 30 x 0 

## 2015-09-12 NOTE — Telephone Encounter (Signed)
Last refill 08-12-15 #30/0 Last OV 08-12-15 Next OV 10-27-15

## 2015-10-10 ENCOUNTER — Other Ambulatory Visit: Payer: Self-pay | Admitting: Internal Medicine

## 2015-10-10 NOTE — Telephone Encounter (Signed)
Spoke to patient. Morphine RX up front for pick up. Called in Lorazepam on voice mail at pharmacy

## 2015-10-10 NOTE — Telephone Encounter (Signed)
Lorazepam last refill 09-12-15 #30 Morphine last refill 09-02-15 #120 Last OV 08-12-15 Next OV 10-27-15

## 2015-10-10 NOTE — Telephone Encounter (Signed)
Please phone in the lorazepam 

## 2015-10-27 ENCOUNTER — Encounter: Payer: Self-pay | Admitting: Internal Medicine

## 2015-10-27 ENCOUNTER — Ambulatory Visit (INDEPENDENT_AMBULATORY_CARE_PROVIDER_SITE_OTHER): Payer: PPO | Admitting: Internal Medicine

## 2015-10-27 VITALS — BP 120/60 | HR 64 | Temp 97.6°F | Ht 64.5 in | Wt 92.0 lb

## 2015-10-27 DIAGNOSIS — Z7189 Other specified counseling: Secondary | ICD-10-CM

## 2015-10-27 DIAGNOSIS — G629 Polyneuropathy, unspecified: Secondary | ICD-10-CM

## 2015-10-27 DIAGNOSIS — B0229 Other postherpetic nervous system involvement: Secondary | ICD-10-CM | POA: Diagnosis not present

## 2015-10-27 DIAGNOSIS — Z0000 Encounter for general adult medical examination without abnormal findings: Secondary | ICD-10-CM | POA: Diagnosis not present

## 2015-10-27 DIAGNOSIS — E440 Moderate protein-calorie malnutrition: Secondary | ICD-10-CM

## 2015-10-27 DIAGNOSIS — K2100 Gastro-esophageal reflux disease with esophagitis, without bleeding: Secondary | ICD-10-CM

## 2015-10-27 DIAGNOSIS — F39 Unspecified mood [affective] disorder: Secondary | ICD-10-CM

## 2015-10-27 NOTE — Assessment & Plan Note (Signed)
Ongoing mood issues related to pain

## 2015-10-27 NOTE — Assessment & Plan Note (Signed)
I have personally reviewed the Medicare Annual Wellness questionnaire and have noted 1. The patient's medical and social history 2. Their use of alcohol, tobacco or illicit drugs 3. Their current medications and supplements 4. The patient's functional ability including ADL's, fall risks, home safety risks and hearing or visual             impairment. 5. Diet and physical activities 6. Evidence for depression or mood disorders  The patients weight, height, BMI and visual acuity have been recorded in the chart I have made referrals, counseling and provided education to the patient based review of the above and I have provided the pt with a written personalized care plan for preventive services.  I have provided you with a copy of your personalized plan for preventive services. Please take the time to review along with your updated medication list.  No cancer screening UTD on imms---no zostavax given ongoing shingles

## 2015-10-27 NOTE — Assessment & Plan Note (Signed)
See social history Blank forms given 

## 2015-10-27 NOTE — Assessment & Plan Note (Signed)
Worst case ever. Intolerant of multiple meds---can't take slow release narcotics Dr Vickki Muff looking into pain referral--I am okay with this

## 2015-10-27 NOTE — Assessment & Plan Note (Signed)
Taking boost Weight has stabilized Will try to get dysphagia controlled--may help

## 2015-10-27 NOTE — Progress Notes (Signed)
Pre visit review using our clinic review tool, if applicable. No additional management support is needed unless otherwise documented below in the visit note. 

## 2015-10-27 NOTE — Progress Notes (Signed)
Subjective:    Patient ID: [name], female    DOB: August 21, 1927, 82 y.o.   MRN: [id number]  HPI Here for Medicare wellness and follow up of chronic health conditions Reviewed form and advanced directives Reviewed other doctors No alcohol or tobacco Tries to walk regularly. Vision is fine in left eye. Hearing okay --?mildly decreased No falls Episodic sadness with her eye, etc. Not anhedonic Feels her cognition is okay---has DIL does bills though Lives alone and remains independent  Recent procedure on right eye Lid now kept shut again Feels it makes her "dizzy and drunk" Still has active shingles pain and rash at times Pain in forehead---all the way back to posterior neck Dr Vickki Muff is working on referral to pain clinic Morphine does help--but not for long  Having some trouble with throat Pain in neck Trouble swallowing Taking pantoprazole bid and still has problems  Drinks 2 boost a day Then eats as much as she can--but can't eat meat, etc Weight is stable Goes to senior building for meal most days  No chest pain Gets sense of SOB at times just sitting---better with a big breath No syncope  Current Outpatient Prescriptions on File Prior to Visit  Medication Sig Dispense Refill  . acyclovir (ZOVIRAX) 800 MG tablet TAKE 1 TABLET BY MOUTH 4 TIMES A DAY 120 tablet 11  . aspirin 81 MG tablet Take 81 mg by mouth daily.      . Bepotastine Besilate (BEPREVE OP) Apply to eye 4 (four) times daily.    [name] erythromycin [facility]) ophthalmic ointment Use a small amount on your sutures 4 times a day for the next 2 weeks. 3.5 g 3  . fluticasone (FLONASE) 50 MCG/ACT nasal spray Place 2 sprays into both nostrils daily.    [name] LORazepam (ATIVAN) 1 MG tablet TAKE 1/2 TO 1 TABLET BY MOUTH AT BEDTIMEAS NEEDED 30 tablet 0  . morphine (MSIR) 15 MG tablet TAKE 1 TABLET BY MOUTH EVERY 4 HOURS AS NEEDED FOR SEVERE PAIN 120 tablet 0  . oxyCODONE-acetaminophen (PERCOCET) 5-325 MG tablet Take 1  tablet by mouth every 4 (four) hours as needed for severe pain. 6 tablet 0  . pantoprazole (PROTONIX) 40 MG tablet TAKE 1 TABLET BY MOUTH TWICE A DAY 60 tablet 11  . topiramate (TOPAMAX) 100 MG tablet Take 1 tablet (100 mg total) by mouth at bedtime. (Patient taking differently: Take 50 mg by mouth at bedtime. )     No current facility-administered medications on file prior to visit.    Allergies  Allergen Reactions  . Duloxetine     REACTION: N/V Mental status change and trouble with balance  . Cymbalta [Duloxetine Hcl]     Unsure of reaction  . Maxitrol [Neomycin-Polymyxin-Dexameth]     Unsure of reaction  . Omeprazole     REACTION: Didn't work  . Pregabalin     REACTION: SOB, swollen lips  . Tobradex [Tobramycin-Dexamethasone]     Unsure of reaction    Past Medical History  Diagnosis Date  . Breast cancer ([place])     Bilateral mastectomies 1986.  [name] GERD (gastroesophageal reflux disease)   . Hyperlipidemia   . Arthritis   . Depression   . Zoster      with Post-herpetic neuralgia  . History of partial thyroidectomy   . Dyspnea     Echo (5/11) was a difficult study due to breast implants but showed normal LV and RV size and systolic function.      [name]  Atypical chest pain     Lexiscan myoview (5/11) with EF 84%, normal wall motion, small fixed apical  perfusion defect likely due to breast attenuation, no evidence for ischemia or infarction.  **Patient had an  . CVA (cerebral infarction) 10/12    right lacunar  . Obstruction of intestine ([place])   . Partial small bowel obstruction ([place]) 7/14    no surgery  . Hypertension     in past - no current meds/issues  . Wears dentures     full upper  . Headache     s/p shingles - right side of head    Past Surgical History  Procedure Laterality Date  . Abdominal hysterectomy    . Mastectomy  1980    bilateral  . Vaginal delivery    . Neck surgery    . Tarsorrhaphy Bilateral 08/16/2015    Procedure: MINOR TARSORRAPHY LATERAL  PLACEMENT;  Surgeon: Karle Starch, MD;  Location: [facility];  Service: Ophthalmology;  Laterality: Bilateral;    No family history on file.  Social History   Social History  . Marital Status: Widowed    Spouse Name: N/A  . Number of Children: 2  . 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 Smoking status: Never Smoker   . Smokeless tobacco: Never Used  . Alcohol Use: No  . Drug Use: No  . Sexual Activity: Not on file   Other Topics Concern  . Not on file   Social History Narrative   No living will   No health care POA but requests daughter-in-law [name] to do this   Would like attempts at resuscitation   No feeding tube if cognitively unaware   Review of Systems Sleeps okay Bowels are still slow---does use miralax Voids okay but dark yellow--discussed increasing fluids Some joint pains No skin rash or suspicious spots Bruises very easy Ongoing trouble with teeth--tops pulled and upper plate    Objective:   Physical Exam  Constitutional: She is oriented to person, place, and time.  HENT:  Mouth/Throat: Oropharynx is clear and moist. No oropharyngeal exudate.  Neck: Normal range of motion. Neck supple.  Cardiovascular: Normal rate, regular rhythm, normal heart sounds and intact distal pulses.  Exam reveals no gallop.   No murmur heard. Pulmonary/Chest: Effort normal and breath sounds normal. No respiratory distress. She has no wheezes. She has no rales.  Abdominal: Soft. She exhibits no distension. There is no tenderness. There is no rebound and no guarding.  Musculoskeletal: She exhibits no edema.  Lymphadenopathy:    She has no cervical adenopathy.  Neurological: She is alert and oriented to person, place, and time.  2016 instead of 2017. President-- "Dwaine Deter, Bush" [phone] D-[id number] Recall 0/3  Skin: No rash noted. No erythema.  No active zoster lesions  Psychiatric:    Uncomfortable but no distress          Assessment & Plan:

## 2015-10-27 NOTE — Assessment & Plan Note (Signed)
Much worse and now trouble swallowing Will refer to GI

## 2015-10-27 NOTE — Assessment & Plan Note (Signed)
Combination  Takes her B12 and topiramate

## 2015-11-07 ENCOUNTER — Encounter: Payer: Self-pay | Admitting: General Surgery

## 2015-11-07 ENCOUNTER — Ambulatory Visit (INDEPENDENT_AMBULATORY_CARE_PROVIDER_SITE_OTHER): Payer: PPO | Admitting: General Surgery

## 2015-11-07 VITALS — BP 92/65 | Resp 12 | Ht 66.0 in | Wt 92.0 lb

## 2015-11-07 DIAGNOSIS — R1310 Dysphagia, unspecified: Secondary | ICD-10-CM

## 2015-11-07 NOTE — Progress Notes (Signed)
Patient ID: [name], female   DOB: 05/09/1928, 82 y.o.   MRN: [id number]  Chief Complaint  Patient presents with  . Other    Esophagitis    HPI [name] is a 82 y.o. female here today for an evaluation for a knot in her throat. She has a hard time swallowing solid foods. She first noticed this about 2-3 weeks ago.  This is not a new problem-she had similar complaint in 2014. Endoscopy showed no abnormalities at that time and she was to see ENT-she did not keep that appointment. I have reviewed the history of present illness with the patient. HPI  Past Medical History  Diagnosis Date  . Breast cancer ([place])     Bilateral mastectomies 1986.  [name] GERD (gastroesophageal reflux disease)   . Hyperlipidemia   . Arthritis   . Depression   . Zoster      with Post-herpetic neuralgia  . History of partial thyroidectomy   . Dyspnea     Echo (5/11) was a difficult study due to breast implants but showed normal LV and RV size and systolic function.      [name] Atypical chest pain     Lexiscan myoview (5/11) with EF 84%, normal wall motion, small fixed apical  perfusion defect likely due to breast attenuation, no evidence for ischemia or infarction.  **Patient had an  . CVA (cerebral infarction) 10/12    right lacunar  . Obstruction of intestine ([place])   . Partial small bowel obstruction ([place]) 7/14    no surgery  . Hypertension     in past - no current meds/issues  . Wears dentures     full upper  . Headache     s/p shingles - right side of head    Past Surgical History  Procedure Laterality Date  . Abdominal hysterectomy    . Mastectomy  1980    bilateral  . Vaginal delivery    . Neck surgery    . Tarsorrhaphy Bilateral 08/16/2015    Procedure: MINOR TARSORRAPHY LATERAL PLACEMENT;  Surgeon: Karle Starch, MD;  Location: [facility];  Service: Ophthalmology;  Laterality: Bilateral;    History reviewed. No pertinent family history.  Social History Social History   Substance Use Topics  . Smoking status: Never Smoker   . Smokeless tobacco: Never Used  . Alcohol Use: No    Allergies  Allergen Reactions  . Duloxetine     REACTION: N/V Mental status change and trouble with balance  . Cymbalta [Duloxetine Hcl]     Unsure of reaction  . Maxitrol [Neomycin-Polymyxin-Dexameth]     Unsure of reaction  . Pregabalin     REACTION: SOB, swollen lips  . Tobradex [Tobramycin-Dexamethasone]     Unsure of reaction    Current Outpatient Prescriptions  Medication Sig Dispense Refill  . acyclovir (ZOVIRAX) 800 MG tablet TAKE 1 TABLET BY MOUTH 4 TIMES A DAY 120 tablet 11  . aspirin 81 MG tablet Take 81 mg by mouth daily.      . Bepotastine Besilate (BEPREVE OP) Apply to eye 4 (four) times daily.    . fluticasone (FLONASE) 50 MCG/ACT nasal spray Place 2 sprays into both nostrils daily.    [name] LORazepam (ATIVAN) 1 MG tablet TAKE 1/2 TO 1 TABLET BY MOUTH AT BEDTIMEAS NEEDED 30 tablet 0  . morphine (MSIR) 15 MG tablet TAKE 1 TABLET BY MOUTH EVERY 4 HOURS AS NEEDED FOR SEVERE PAIN 120 tablet 0  .  pantoprazole (PROTONIX) 40 MG tablet TAKE 1 TABLET BY MOUTH TWICE A DAY 60 tablet 11  . topiramate (TOPAMAX) 100 MG tablet Take 1 tablet (100 mg total) by mouth at bedtime. (Patient taking differently: Take 50 mg by mouth at bedtime. )     No current facility-administered medications for this visit.    Review of Systems Review of Systems  Constitutional: Negative.   Respiratory: Positive for choking and shortness of breath.   Cardiovascular: Negative.     Blood pressure 92/65, resp. rate 12, height 5\' 6"  (1.676 m), weight 92 lb (41.731 kg).  Physical Exam Physical Exam  Constitutional: She is oriented to person, place, and time.  Eyes: No scleral icterus.  Neck: Neck supple. Thyromegaly (very mild smooth prominance of left thyroid lobe.) present. No thyroid mass present.  Lymphadenopathy:    She has no cervical adenopathy.    She has no axillary adenopathy.   Neurological: She is alert and oriented to person, place, and time.  Skin: Skin is warm and dry.    Data Reviewed Prior notes, endoscopy report  Assessment   Dysphagia, not a new problem. This unlikely from reflux disease as had a totally normal endoscopy in 2014. Still feel she need ENT eval which she has not had.    Plan    Reccomend assessment by ENT. Discussed with pt and she is agreeable.     This information has been scribed by Verlene Mayer, CMA    PCP: Dr. Nonie Hoyer 11/07/2015, 11:34 AM

## 2015-11-07 NOTE — Patient Instructions (Addendum)
Recommend assessment by ENT

## 2015-11-09 ENCOUNTER — Telehealth: Payer: Self-pay | Admitting: *Deleted

## 2015-11-09 NOTE — Telephone Encounter (Signed)
Patient has been scheduled for an appointment with Dr. Richardson Landry at [place], and [facility] location for 11-17-15 at 10:45 am (arrive 10:15 am; [place] MR# [id number]). This patient is aware to take a list of medications, photo ID, insurance card, and co-pay. She verbalizes understanding.

## 2015-11-10 ENCOUNTER — Other Ambulatory Visit: Payer: Self-pay | Admitting: Internal Medicine

## 2015-11-10 NOTE — Telephone Encounter (Signed)
Approved: #30 x 0 for lorazepam topiramate for a year

## 2015-11-10 NOTE — Telephone Encounter (Signed)
Topiramate last filled 05-16-15 #60 Lorazepam last filled 10-10-15 #30 Last OV 10-27-15 No Future OV

## 2015-11-11 DIAGNOSIS — H01119 Allergic dermatitis of unspecified eye, unspecified eyelid: Secondary | ICD-10-CM | POA: Diagnosis not present

## 2015-11-11 NOTE — Telephone Encounter (Signed)
Called in Lorazepam on vm. Sent topiramate electronically.

## 2015-11-17 DIAGNOSIS — R1314 Dysphagia, pharyngoesophageal phase: Secondary | ICD-10-CM | POA: Diagnosis not present

## 2015-11-17 DIAGNOSIS — I69291 Dysphagia following other nontraumatic intracranial hemorrhage: Secondary | ICD-10-CM | POA: Diagnosis not present

## 2015-11-17 DIAGNOSIS — K219 Gastro-esophageal reflux disease without esophagitis: Secondary | ICD-10-CM | POA: Diagnosis not present

## 2015-11-18 ENCOUNTER — Other Ambulatory Visit: Payer: Self-pay | Admitting: Otolaryngology

## 2015-11-18 ENCOUNTER — Other Ambulatory Visit: Payer: Self-pay | Admitting: *Deleted

## 2015-11-18 DIAGNOSIS — R1310 Dysphagia, unspecified: Secondary | ICD-10-CM

## 2015-11-21 ENCOUNTER — Other Ambulatory Visit: Payer: Self-pay | Admitting: Internal Medicine

## 2015-11-21 NOTE — Telephone Encounter (Signed)
Last filled 10-10-15 #120. Last OV 10-27-15 No Future OV

## 2015-11-21 NOTE — Telephone Encounter (Signed)
Printed rx waiting for signature

## 2015-11-21 NOTE — Telephone Encounter (Signed)
Left message that rx was ready per [facility]

## 2015-12-12 ENCOUNTER — Ambulatory Visit
Admission: RE | Admit: 2015-12-12 | Discharge: 2015-12-12 | Disposition: A | Discharge status: Home / Self Care | Payer: PPO | Source: Ambulatory Visit | Attending: Otolaryngology | Admitting: Otolaryngology

## 2015-12-12 DIAGNOSIS — R1310 Dysphagia, unspecified: Secondary | ICD-10-CM | POA: Diagnosis not present

## 2015-12-12 DIAGNOSIS — R1313 Dysphagia, pharyngeal phase: Secondary | ICD-10-CM

## 2015-12-12 NOTE — Therapy (Signed)
[place], [place], [address] Phone: [phone]   Fax:     Modified Barium Swallow  Patient Details  Name: [name] MRN: [id number] Date of Birth: 29-Oct-1927 No Data Recorded  Encounter Date: 12/12/2015      End of Session - 12/12/15 1345    Visit Number 1   Number of Visits 1   Date for SLP Re-Evaluation 12/12/15   SLP Start Time 43   SLP Stop Time  1330   SLP Time Calculation (min) 60 min   Activity Tolerance Patient tolerated treatment well      Past Medical History  Diagnosis Date  . Breast cancer ([place])     Bilateral mastectomies 1986.  [name] GERD (gastroesophageal reflux disease)   . Hyperlipidemia   . Arthritis   . Depression   . Zoster      with Post-herpetic neuralgia  . History of partial thyroidectomy   . Dyspnea     Echo (5/11) was a difficult study due to breast implants but showed normal LV and RV size and systolic function.      [name] Atypical chest pain     Lexiscan myoview (5/11) with EF 84%, normal wall motion, small fixed apical  perfusion defect likely due to breast attenuation, no evidence for ischemia or infarction.  **Patient had an  . CVA (cerebral infarction) 10/12    right lacunar  . Obstruction of intestine ([place])   . Partial small bowel obstruction ([place]) 7/14    no surgery  . Hypertension     in past - no current meds/issues  . Wears dentures     full upper  . Headache     s/p shingles - right side of head    Past Surgical History  Procedure Laterality Date  . Abdominal hysterectomy    . Mastectomy  1980    bilateral  . Vaginal delivery    . Neck surgery    . Tarsorrhaphy Bilateral 08/16/2015    Procedure: MINOR TARSORRAPHY LATERAL PLACEMENT;  Surgeon: Karle Starch, MD;  Location: [place];  Service: Ophthalmology;  Laterality: Bilateral;    There were no vitals filed for this visit.    Subjective: Patient behavior: (alertness,  ability to follow instructions, etc.): The patient is able to follow directions.  She was stimulable for swallow maneuver.  Chief complaint: foods get stuck or she cannot swallow; pain in her neck associated with swallowing.  Patient has history of stroke and has cervical plate.   Objective:  Radiological Procedure: A videoflouroscopic evaluation of oral-preparatory, reflex initiation, and pharyngeal phases of the swallow was performed; as well as a screening of the upper esophageal phase.  I. POSTURE: Upright in MBS chair  II. VIEW: lateral  III. COMPENSATORY STRATEGIES: liquid wash- clears most of pharyngeal residue  IV. BOLUSES ADMINISTERED:   Thin Liquid: 3 cup rim sips   Nectar-thick Liquid: 1 cup rim sip    Puree: 2 teaspoon presentations   Mechanical Soft: 1/4 graham cracker in applesauce  V. RESULTS OF EVALUATION: A. ORAL PREPARATORY PHASE: (The lips, tongue, and velum are observed for strength and coordination)       **Overall Severity Rating: Within normal limits  B. SWALLOW INITIATION/REFLEX: (The reflex is normal if "triggered" by the time the bolus reached the base of the tongue)  **Overall Severity Rating: Within functional limits- triggers between the base of the tongue and the valleculae  C. PHARYNGEAL PHASE: (  Pharyngeal function is normal if the bolus shows rapid, smooth, and continuous transit through the pharynx and there is no pharyngeal residue after the swallow)  **Overall Severity Rating: Mild- decreased tongue base retraction, decreased pharyngeal constriction, decreased anterior hyoid movement, decreased amplitude/duration UES opening  D. LARYNGEAL PENETRATION: (Material entering into the laryngeal inlet/vestibule but not aspirated): transient with thin liquid    E. ASPIRATION: None  F. ESOPHAGEAL PHASE: (Screening of the upper esophagus) cervical esophagus appears "tight"  ASSESSMENT: 82 year old woman, with difficulty swallowing (foods get stuck ["I  can't swallow the meat"] and painful swallowing [patient indicates her neck muscles are sore]), is presenting with mild pharyngeal dysphagia characterized by reduced pharyngeal pressure generation, decreased anterior hyoid movement, and decreased amplitude/duration of UES opening.  There is moderate pharyngeal residue with solids, decreasing to min with thin liquid.  Oral control of the bolus including oral hold, rotary mastication, and anterior to posterior transfer are within normal limits. Timing of the pharyngeal swallow is within functional limits.  There was transient laryngeal penetration of thin liquid and no observed aspiration.  The patient is at not at significant risk for prandial aspiration.  The patient may benefit from exercises to improve pharyngeal pressure generation and hyolaryngeal movement and neck/tongue/throat stretches to reduce extrinsic and intrinsic laryngeal muscle tension.  PLAN/RECOMMENDATIONS:   A. Diet: Regular as tolerated- avoid foods that are problematic   B. Swallowing Precautions: Alternate liquid and solid, small bites/sips, reflux precautions   C. Recommended consultation to: follow up with ENT as recommended   D. Therapy recommendations: SLP for laryngeal / pharyngeal strengthening exercises and extrinsic/intrisic laryngeal muscle stretches   E. Results and recommendations were discussed with the patient and her daughter immediately following the study and final report will be routed to referring MD.     Patient will benefit from skilled therapeutic intervention in order to improve the following deficits and impairments:   Dysphagia, pharyngeal phase  Dysphagia - Plan: DG OP Swallowing Func-Medicare/Speech Path, DG OP Swallowing Func-Medicare/Speech Path      G-Codes - 13-Dec-2015 1346    Functional Assessment Tool Used MBS, clinical judgment   Functional Limitations Swallowing   Swallow Current Status [id number]) At least 20 percent but less than 40  percent impaired, limited or restricted   Swallow Goal Status [id number]) At least 20 percent but less than 40 percent impaired, limited or restricted   Swallow Discharge Status [phone]) At least 20 percent but less than 40 percent impaired, limited or restricted          Problem List Patient Active Problem List   Diagnosis Date Noted  . Advance directive discussed with patient 10/27/2015  . Dizziness 08/12/2015  . Allergic rhinitis due to pollen 05/20/2015  . Routine general medical examination at a health care facility 02/22/2014  . Moderate malnutrition ([place]) 03/24/2012  . Hypertension   . B12 deficiency 05/03/2009  . Neuropathy ([place]) 04/04/2009  . Episodic mood disorder ([place]) 06/16/2008  . SHINGLES, RECURRENT 11/25/2007  . GLAUCOMA NOS 11/14/2006  . SLEEP DISORDER 11/14/2006  . HYPERLIPIDEMIA 08/28/2006  . GERD 08/28/2006  . OSTEOARTHRITIS 08/28/2006  . BREAST CANCER, HX OF 08/28/2006  . Post herpetic neuralgia 04/03/2005    Lou Miner 12-13-2015, 1:47 PM  [facility] [place], [place], [address] Phone: [phone]   Fax:     Name: [name] MRN: [id number] Date of Birth: September 14, 1927

## 2015-12-15 ENCOUNTER — Encounter: Payer: Self-pay | Admitting: Pain Medicine

## 2015-12-15 ENCOUNTER — Ambulatory Visit: Payer: PPO | Attending: Pain Medicine | Admitting: Pain Medicine

## 2015-12-15 VITALS — BP 157/60 | HR 69 | Temp 98.5°F | Resp 16 | Ht 66.0 in | Wt 94.0 lb

## 2015-12-15 DIAGNOSIS — H18893 Other specified disorders of cornea, bilateral: Secondary | ICD-10-CM | POA: Diagnosis not present

## 2015-12-15 DIAGNOSIS — H5441 Blindness, right eye, normal vision left eye: Secondary | ICD-10-CM | POA: Insufficient documentation

## 2015-12-15 DIAGNOSIS — K219 Gastro-esophageal reflux disease without esophagitis: Secondary | ICD-10-CM | POA: Diagnosis not present

## 2015-12-15 DIAGNOSIS — H16233 Neurotrophic keratoconjunctivitis, bilateral: Secondary | ICD-10-CM | POA: Insufficient documentation

## 2015-12-15 DIAGNOSIS — H189 Unspecified disorder of cornea: Secondary | ICD-10-CM | POA: Diagnosis not present

## 2015-12-15 DIAGNOSIS — M5481 Occipital neuralgia: Secondary | ICD-10-CM | POA: Insufficient documentation

## 2015-12-15 DIAGNOSIS — Z853 Personal history of malignant neoplasm of breast: Secondary | ICD-10-CM | POA: Insufficient documentation

## 2015-12-15 DIAGNOSIS — B0229 Other postherpetic nervous system involvement: Secondary | ICD-10-CM | POA: Insufficient documentation

## 2015-12-15 DIAGNOSIS — M1990 Unspecified osteoarthritis, unspecified site: Secondary | ICD-10-CM | POA: Insufficient documentation

## 2015-12-15 DIAGNOSIS — B0223 Postherpetic polyneuropathy: Secondary | ICD-10-CM | POA: Diagnosis not present

## 2015-12-15 DIAGNOSIS — M542 Cervicalgia: Secondary | ICD-10-CM | POA: Diagnosis not present

## 2015-12-15 NOTE — Progress Notes (Signed)
Subjective:    Patient ID: [name], female    DOB: 1927-10-15, 82 y.o.   MRN: [id number]  HPI The patient is an 82 year old female who comes to pain management Center at the request of PCP Kia Eftekheri for further evaluation and treatment of pain involving the face heated and neck on the right side. The patient is with history of shingles several years ago and there has been concern regarding patient's pain being due to postherpetic neuralgia.. The patient is undergone prior treatment of the pain including interventional treatment and pain has persisted. The patient stated that the pain is agonizing itching tingling toothache-like sharp tingling sensation that occurs with any motion. The patient states that the pain decreases at times with being still. We discussed patient's condition and treatment options and after considering various treatments for the patient's condition and decision was made to refer patient to [place] for evaluation and treatment at this time. We'll maintain available to consider patient for treatment pending evaluation and recommendations at [facility]. The patient was with understanding and agreed with suggested treatment plan   Review of Systems    Cardiovascular: Daily aspirin intake  Pulmonary: Unremarkable  Neurological: Unremarkable  Psychological: Unremarkable  Gastrointestinal Gastroesophageal reflux disease  Genitourinary: Unremarkable  Hematologic: Unremarkable  Endocrine: Unremarkable  Rheumatological: Osteoarthritis  Musculoskeletal: Unremarkable  Other significant: Breast cancer 1986     Objective:   Physical Exam  There was tenderness to palpation of the right side. With tenderness of the vertex and occipital regions predominantly there was minimal tenderness to palpation of the superior orbital region on the right. No new lesions of the head and neck were noted. Palpation of the radicular area  was with mild tenderness to palpation without significant tenderness to palpation of the temporomandibular joint region. There were no bounding pulsations of the temporal region noted. There was tenderness to palpation of the paraspinal musculature region of the cervical region cervical facet regions with palpation of the splenius capitis and occipitalis musculature regions reproducing moderately severe discomfort. The left eyelid has been sutured shut. 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 There was tends to palpation of the trapezius levator scapula rhomboid musculature region a moderate degree. The patient appeared to be with slightly decreased grip strength and Tinel and Phalen's maneuver were without increased pain of significant degree. There was tenderness of the thoracic region thoracic facet region as well with no crepitus of the thoracic region noted. There was moderate tenderness to palpation of the paraspinal musculature region of the lumbar region and lumbar facet region with palpation over the PSIS and PII S region reproducing moderate discomfort. There was mild tenderness of the greater trochanteric region iliotibial band region. Straight leg raising was tolerated to 30 without increased pain with dorsiflexion noted. There was tends to palpation of the knees noted. No sensory deficit or dermatomal distribution of the lower extremities noted. EHL strength appeared to be decreased. Negative clonus negative Homans. No abdominal tends to palpation and no costovertebral tenderness noted    Assessment & Plan:    Postherpetic neuralgia of right head and face  Bilateral occipital neuralgia  Hypertrophic keratopathy    PLAN  Continue present medication  F/U PCP  [place]  for evaliation of  BP and general medical  condition  F/U surgical evaluation. May consider pending follow-up evaluations  Ask the nurse and secretary the date of your appointment at [facility] for evaluation of  pain of the head and face  F/U neurological evaluation. May consider pending follow-up evaluations  May consider radiofrequency rhizolysis or intraspinal procedures pending response to present treatment and F/U evaluation   Patient to call Pain Management Center should patient have concerns prior to scheduled return appointment.

## 2015-12-15 NOTE — Patient Instructions (Addendum)
PLAN  Continue present medication  F/U PCP  [facility]  for evaliation of  BP and general medical  condition  F/U surgical evaluation. May consider pending follow-up evaluations  Ask the nurse and secretary the date of your appointment at [facility] for evaluation of pain of the head and face  F/U neurological evaluation. May consider pending follow-up evaluations  May consider radiofrequency rhizolysis or intraspinal procedures pending response to present treatment and F/U evaluation   Patient to call Pain Management Center should patient have concerns prior to scheduled return appointment.

## 2015-12-19 ENCOUNTER — Other Ambulatory Visit: Payer: Self-pay | Admitting: Internal Medicine

## 2015-12-19 ENCOUNTER — Encounter: Payer: Self-pay | Admitting: Emergency Medicine

## 2015-12-19 ENCOUNTER — Emergency Department
Admission: EM | Admit: 2015-12-19 | Discharge: 2015-12-20 | Disposition: A | Discharge status: Home / Self Care | Payer: PPO | Attending: Emergency Medicine | Admitting: Emergency Medicine

## 2015-12-19 ENCOUNTER — Emergency Department: Payer: PPO

## 2015-12-19 DIAGNOSIS — Z7982 Long term (current) use of aspirin: Secondary | ICD-10-CM | POA: Diagnosis not present

## 2015-12-19 DIAGNOSIS — S42292A Other displaced fracture of upper end of left humerus, initial encounter for closed fracture: Secondary | ICD-10-CM | POA: Diagnosis not present

## 2015-12-19 DIAGNOSIS — Z853 Personal history of malignant neoplasm of breast: Secondary | ICD-10-CM | POA: Insufficient documentation

## 2015-12-19 DIAGNOSIS — T148 Other injury of unspecified body region: Secondary | ICD-10-CM | POA: Diagnosis not present

## 2015-12-19 DIAGNOSIS — W010XXA Fall on same level from slipping, tripping and stumbling without subsequent striking against object, initial encounter: Secondary | ICD-10-CM | POA: Diagnosis not present

## 2015-12-19 DIAGNOSIS — F329 Major depressive disorder, single episode, unspecified: Secondary | ICD-10-CM | POA: Diagnosis not present

## 2015-12-19 DIAGNOSIS — Z79899 Other long term (current) drug therapy: Secondary | ICD-10-CM | POA: Insufficient documentation

## 2015-12-19 DIAGNOSIS — S42212A Unspecified displaced fracture of surgical neck of left humerus, initial encounter for closed fracture: Secondary | ICD-10-CM | POA: Diagnosis not present

## 2015-12-19 DIAGNOSIS — E785 Hyperlipidemia, unspecified: Secondary | ICD-10-CM | POA: Insufficient documentation

## 2015-12-19 DIAGNOSIS — S8991XA Unspecified injury of right lower leg, initial encounter: Secondary | ICD-10-CM | POA: Diagnosis not present

## 2015-12-19 DIAGNOSIS — S42252A Displaced fracture of greater tuberosity of left humerus, initial encounter for closed fracture: Secondary | ICD-10-CM | POA: Diagnosis not present

## 2015-12-19 DIAGNOSIS — I10 Essential (primary) hypertension: Secondary | ICD-10-CM | POA: Insufficient documentation

## 2015-12-19 DIAGNOSIS — R0781 Pleurodynia: Secondary | ICD-10-CM | POA: Diagnosis not present

## 2015-12-19 DIAGNOSIS — Y929 Unspecified place or not applicable: Secondary | ICD-10-CM | POA: Diagnosis not present

## 2015-12-19 DIAGNOSIS — Y939 Activity, unspecified: Secondary | ICD-10-CM | POA: Insufficient documentation

## 2015-12-19 DIAGNOSIS — S42352A Displaced comminuted fracture of shaft of humerus, left arm, initial encounter for closed fracture: Secondary | ICD-10-CM | POA: Insufficient documentation

## 2015-12-19 DIAGNOSIS — Y999 Unspecified external cause status: Secondary | ICD-10-CM | POA: Insufficient documentation

## 2015-12-19 DIAGNOSIS — W19XXXA Unspecified fall, initial encounter: Secondary | ICD-10-CM

## 2015-12-19 DIAGNOSIS — M1990 Unspecified osteoarthritis, unspecified site: Secondary | ICD-10-CM | POA: Insufficient documentation

## 2015-12-19 DIAGNOSIS — M25561 Pain in right knee: Secondary | ICD-10-CM | POA: Diagnosis not present

## 2015-12-19 DIAGNOSIS — M79671 Pain in right foot: Secondary | ICD-10-CM | POA: Diagnosis not present

## 2015-12-19 DIAGNOSIS — S90811A Abrasion, right foot, initial encounter: Secondary | ICD-10-CM | POA: Diagnosis not present

## 2015-12-19 DIAGNOSIS — S42302A Unspecified fracture of shaft of humerus, left arm, initial encounter for closed fracture: Secondary | ICD-10-CM

## 2015-12-19 DIAGNOSIS — S299XXA Unspecified injury of thorax, initial encounter: Secondary | ICD-10-CM | POA: Diagnosis not present

## 2015-12-19 DIAGNOSIS — Z7951 Long term (current) use of inhaled steroids: Secondary | ICD-10-CM | POA: Diagnosis not present

## 2015-12-19 DIAGNOSIS — M25512 Pain in left shoulder: Secondary | ICD-10-CM | POA: Diagnosis not present

## 2015-12-19 MED ORDER — MORPHINE SULFATE (PF) 4 MG/ML IV SOLN
INTRAVENOUS | Status: AC
  Filled 2015-12-19: qty 1

## 2015-12-19 MED ORDER — MORPHINE SULFATE (PF) 4 MG/ML IV SOLN
4.0000 mg | Freq: Once | INTRAVENOUS | Status: AC
  Administered 2015-12-19: 4 mg via INTRAVENOUS

## 2015-12-19 NOTE — ED Provider Notes (Signed)
[facility] Emergency Department Provider Note  ____________________________________________  Time seen: Approximately 11:51 PM  I have reviewed the triage vital signs and the nursing notes.   HISTORY  Chief Complaint Fall    HPI [name] is a 82 y.o. female presents after tripping over a piece of concrete landing on her left side. She's had immediate and fairly severe pain in the left shoulder. She denies any other injury other than possibly scraping her right ankle slightly, but she is able to stand up and walk without pain or discomfort.  No nausea vomiting. Did not strike her head or injure her neck. There was no loss of consciousness. No preceding symptoms and she reports that she simply tripped. This was witnessed by her family, who reports she was caring a pie when she tripped over a curb.   Past Medical History  Diagnosis Date  . Breast cancer ([place])     Bilateral mastectomies 1986.  [name] GERD (gastroesophageal reflux disease)   . Hyperlipidemia   . Arthritis   . Depression   . Zoster      with Post-herpetic neuralgia  . History of partial thyroidectomy   . Dyspnea     Echo (5/11) was a difficult study due to breast implants but showed normal LV and RV size and systolic function.      [name] Atypical chest pain     Lexiscan myoview (5/11) with EF 84%, normal wall motion, small fixed apical  perfusion defect likely due to breast attenuation, no evidence for ischemia or infarction.  **Patient had an  . CVA (cerebral infarction) 10/12    right lacunar  . Obstruction of intestine ([place])   . Partial small bowel obstruction ([place]) 7/14    no surgery  . Hypertension     in past - no current meds/issues  . Wears dentures     full upper  . Headache     s/p shingles - right side of head    Patient Active Problem List   Diagnosis Date Noted  . 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 Postherpetic neuralgia 12/15/2015  . Advance directive  discussed with patient 10/27/2015  . Dizziness 08/12/2015  . Allergic rhinitis due to pollen 05/20/2015  . Routine general medical examination at a health care facility 02/22/2014  . Moderate malnutrition ([place]) 03/24/2012  . Hypertension   . B12 deficiency 05/03/2009  . Neuropathy ([place]) 04/04/2009  . Episodic mood disorder ([place]) 06/16/2008  . SHINGLES, RECURRENT 11/25/2007  . GLAUCOMA NOS 11/14/2006  . SLEEP DISORDER 11/14/2006  . HYPERLIPIDEMIA 08/28/2006  . GERD 08/28/2006  . OSTEOARTHRITIS 08/28/2006  . BREAST CANCER, HX OF 08/28/2006  . Post herpetic neuralgia 04/03/2005    Past Surgical History  Procedure Laterality Date  . Abdominal hysterectomy    . Mastectomy  1980    bilateral  . Vaginal delivery    . Neck surgery    . Tarsorrhaphy Bilateral 08/16/2015    Procedure: MINOR TARSORRAPHY LATERAL PLACEMENT;  Surgeon: Karle Starch, MD;  Location: [place];  Service: Ophthalmology;  Laterality: Bilateral;  . Breast surgery Bilateral     cancer - mastectomy and implant insertion    Current Outpatient Rx  Name  Route  Sig  Dispense  Refill  . acyclovir (ZOVIRAX) 800 MG tablet      TAKE 1 TABLET BY MOUTH 4 TIMES A DAY   120 tablet   11   . aspirin 81 MG tablet  Oral   Take 81 mg by mouth daily.           . fluticasone (FLONASE) 50 MCG/ACT nasal spray   Each Nare   Place 2 sprays into both nostrils daily.         [name] LORazepam (ATIVAN) 1 MG tablet      TAKE 1/2 TO 1 TABLET BY MOUTH AT BEDTIMEAS NEEDED   30 tablet   0   . morphine (MSIR) 15 MG tablet      TAKE 1 TABLET BY MOUTH EVERY 4 HOURS AS NEEDED FOR PAIN   120 tablet   0   . neomycin-bacitracin-polymyxin (NEOSPORIN) ointment   Topical   Apply 1 application topically every 12 (twelve) hours. apply to eye         . pantoprazole (PROTONIX) 40 MG tablet      TAKE 1 TABLET BY MOUTH TWICE A DAY   60 tablet   11   . Polyethyl Glycol-Propyl Glycol (SYSTANE) 0.4-0.3 % SOLN   Ophthalmic    Apply 1 application to eye 2 (two) times daily.         [name] topiramate (TOPAMAX) 100 MG tablet      TAKE 1 TABLET BY MOUTH TWICE A DAY   60 tablet   11     Allergies Duloxetine; Cymbalta; Maxitrol; Pregabalin; and Tobradex  No family history on file.  Social History Social History  Substance Use Topics  . Smoking status: Never Smoker   . Smokeless tobacco: Never Used  . Alcohol Use: No    Review of Systems Constitutional: No fever/chills Eyes: No visual changes.Chronic loss of the right eye ENT: No sore throat. Cardiovascular: Denies chest pain. Respiratory: Denies shortness of breath. Gastrointestinal: No abdominal pain.  No nausea, no vomiting.  No diarrhea.  No constipation. Genitourinary: Negative for dysuria. Musculoskeletal: Negative for back pain. Skin: Negative for rash. Neurological: Negative for headaches, focal weakness or numbness.  10-point ROS otherwise negative.  ____________________________________________   PHYSICAL EXAM:  VITAL SIGNS: ED Triage Vitals  Enc Vitals Group     BP 12/19/15 2119 175/62 mmHg     Pulse Rate 12/19/15 2119 72     Resp 12/19/15 2119 18     Temp 12/19/15 2119 97.6 F (36.4 C)     Temp Source 12/19/15 2119 Oral     SpO2 12/19/15 2119 100 %     Weight 12/19/15 2119 91 lb (41.277 kg)     Height 12/19/15 2119 5\' 6"  (1.676 m)     Head Cir --      Peak Flow --      Pain Score 12/19/15 2120 3     Pain Loc --      Pain Edu? --      Excl. in [place]? --    Constitutional: Alert and oriented. Well appearing and in no acute distress. Eyes: Conjunctivae are normal. Normal left eye. Head: Atraumatic. Right eye surgically removed. Nose: No congestion/rhinnorhea. Mouth/Throat: Mucous membranes are moist.  Oropharynx non-erythematous. Neck: No stridor.  No cervical spine tenderness Cardiovascular: Normal rate, regular rhythm. Grossly normal heart sounds.  Good peripheral circulation. Respiratory: Normal respiratory effort.  No  retractions. Lungs CTAB. Gastrointestinal: Soft and nontender. No distention. No abdominal bruits. No CVA tenderness. Musculoskeletal: No lower extremity tenderness nor edema.  No joint effusions.   RIGHT Right upper extremity demonstrates normal strength, good use of all muscles. No edema bruising or contusions of the right shoulder/upper arm, right elbow, right forearm /  hand. Full range of motion of the right right upper extremity without pain. No evidence of trauma. Strong radial pulse. Intact median/ulnar/radial neuro-muscular exam.  LEFT Left upper extremity demonstrates limitation strength due to pain at the left shoulder, good use of all muscles which can be tested without inducing significant pain, there is no evidence of motor, vascular, or neurologic compromise. No edema bruising or contusions of the left  left elbow, left forearm / hand. Full range of motion of the elbow, wrist, hand without pain.  Strong radial pulse. Intact median/ulnar/radial neuro-muscular exam.  The patient demonstrates mild edema over the left anterior shoulder, with evidence of mild deformity as well as moderate to severe tenderness over the anterior shoulder/proximal humerus.   Lower Extremities  No edema. Normal DP/PT pulses bilateral with good cap refill.  Normal neuro-motor function lower extremities bilateral.  RIGHT Right lower extremity demonstrates normal strength, good use of all muscles. No edema bruising or contusions of the right hip, right knee, right ankle. Full range of motion of the right lower extremity without pain. No pain on axial loading. No evidence of trauma aside from a very minimal abrasion to the first great toe and also medial ankle without evidence of effusion or deformity.  LEFT Left lower extremity demonstrates normal strength, good use of all muscles. No edema bruising or contusions of the hip,  knee, ankle. Full range of motion of the left lower extremity without pain. No  pain on axial loading. No evidence of trauma.   Neurologic:  Normal speech and language. No gross focal neurologic deficits are appreciated. No gait instability. Skin:  Skin is warm, dry and intact. No rash noted. Psychiatric: Mood and affect are normal. Speech and behavior are normal.  ____________________________________________   LABS (all labs ordered are listed, but only abnormal results are displayed)  Labs Reviewed - No data to display ____________________________________________  EKG   ____________________________________________  RADIOLOGY  CT Shoulder Left Wo Contrast (Final result) Result time: 12/19/15 23:36:17   Final result by Rad Results In Interface (12/19/15 23:36:17)   Narrative:   CLINICAL DATA: Status post fall onto left shoulder, with inability to move left shoulder. Clear Initial encounter.  EXAM: CT OF THE LEFT SHOULDER WITHOUT CONTRAST  TECHNIQUE: Multidetector CT imaging was performed according to the standard protocol. Multiplanar CT image reconstructions were also generated.  COMPARISON: Left shoulder radiographs performed earlier today at 10:30 p.m.  FINDINGS: There is a mildly comminuted fracture involving the left humeral head and neck, with approximately 1/2 shaft width anterior displacement of the distal humerus. There is diffuse trabecular bone injury within the left humeral head. The fracture appears to extend to the edge of the insertion of the rotator cuff.  No additional fractures are seen. Mild degenerative change is noted at the left acromioclavicular joint. The left humeral head remains seated at the glenoid fossa. Mild patchy opacities within the left lung may reflect scarring or mild infection.  IMPRESSION: 1. Mildly comminuted fracture involving the left humeral head and neck, with approximately 1/2 shaft width anterior displacement of the distal humerus. Diffuse trabecular bone injury within the left humeral head.  Fracture appears to extend to the edge of the insertion of the rotator cuff. 2. Mild patchy airspace opacities within the left lung may reflect scarring or mild infection.   Electronically Signed By: Garald Balding M.D. On: 12/19/2015 23:36          DG Foot Complete Right (Final result) Result time: 12/19/15 22:59:24  Final result by Rad Results In Interface (12/19/15 22:59:24)   Narrative:   CLINICAL DATA: Right great toe pain and abrasion after a fall.  EXAM: RIGHT FOOT COMPLETE - 3+ VIEW  COMPARISON: None.  FINDINGS: Degenerative changes in the first metatarsal-phalangeal joint, interphalangeal joints, and tarsometatarsal joints. Diffuse bone demineralization. No evidence of acute fracture or dislocation. Soft tissues are unremarkable. Vascular calcifications.  IMPRESSION: No acute bony abnormalities.   Electronically Signed By: Lucienne Capers M.D. On: 12/19/2015 22:59          DG Ribs Unilateral W/Chest Left (Final result) Result time: 12/19/15 23:01:42   Final result by Rad Results In Interface (12/19/15 23:01:42)   Narrative:   CLINICAL DATA: Anterior and posterior rib pain after a fall.  EXAM: LEFT RIBS AND CHEST - 3+ VIEW  COMPARISON: Chest 05/06/2011 and 05/02/2011.  FINDINGS: Emphysematous changes in the lungs. Scattered fibrosis. Normal heart size and pulmonary vascularity. No focal airspace disease or consolidation in the lungs. No blunting of costophrenic angles. No pneumothorax. Mediastinal contours appear intact. Postoperative changes in the cervical spine. Degenerative changes in the spine and shoulders. Acute fracture of the left humeral neck extending into the greater trochanter.  Left ribs appear intact. No acute fractures or focal bone lesions are identified.  IMPRESSION: Emphysematous changes and fibrosis in the lungs. Negative left ribs. Acute fractures of the left humeral head and neck.   Electronically  Signed By: Lucienne Capers M.D. On: 12/19/2015 23:01          DG Shoulder Left (Final result) Result time: 12/19/15 [id number]   Final result by Rad Results In Interface (12/19/15 [id number])   Narrative:   CLINICAL DATA: Fell and lateral shoulder pain.  EXAM: LEFT SHOULDER - 2+ VIEW  COMPARISON: None.  FINDINGS: Comminuted and displaced fracture involving the proximal left humerus. Fracture is not well characterized on these two views. However, the fracture appears to involve the greater tuberosity and the surgical neck. Difficult to exclude a dislocation. The left AC joint appears to be intact.  IMPRESSION: Fracture of the proximal left humerus.   Electronically Signed By: Markus Daft M.D. On: 12/19/2015 22:06          DG Knee Complete 4 Views Right (Final result) Result time: 12/19/15 22:03:52   Final result by Rad Results In Interface (12/19/15 22:03:52)   Narrative:   CLINICAL DATA: Posterior knee pain after fall.  EXAM: RIGHT KNEE - COMPLETE 4+ VIEW  COMPARISON: 01/27/2015  FINDINGS: Evidence for osteopenia in the bones. No significant joint space narrowing. Negative for fracture, dislocation or joint effusion. Alignment is within normal limits.  IMPRESSION: No acute abnormality.   Electronically Signed By: Markus Daft M.D. On: 12/19/2015 22:03    ____________________________________________   PROCEDURES  Procedure(s) performed: None  Critical Care performed: No  ____________________________________________   INITIAL IMPRESSION / ASSESSMENT AND PLAN / ED COURSE  Pertinent labs & imaging results that were available during my care of the patient were reviewed by me and considered in my medical decision making (see chart for details).  Patient presents after tripping and falling. Obvious injuries the left shoulder, no other evidence of trauma except for a small abrasion around the right ankle and toe. She is able to  walk ambulate without discomfort in the hips or lower extremities. She does however have evidence of obvious injury of the left shoulder. CT demonstrates no dislocation, though there is obvious fracture to the proximal humerus. She is motor, neuro intact without any skin tenting.  Pain well controlled after morphine. No evidence of injury to the torso head or neck. The patient will be discharged home, family taking her with left shoulder in sling, and a plan to follow up closely with orthopedics and her primary care doctor.  Return precautions and treatment recommendations and follow-up discussed with the patient who is agreeable with the plan.  ____________________________________________   FINAL CLINICAL IMPRESSION(S) / ED DIAGNOSES  Final diagnoses:  Closed left humeral fracture, initial encounter  Fall, initial encounter      Delman Kitten, MD 12/19/15 2356

## 2015-12-19 NOTE — ED Notes (Signed)
Patient transported to X-ray 

## 2015-12-19 NOTE — Discharge Instructions (Signed)
Please call orthopedics to set up an appointment this week for follow-up.  No driving tonight. Return to the emergency room if you experience severe pain, a cold or numb left arm or hand, headache, nausea and vomiting, weakness in the hand or arms, or other new concerns arise.  Humerus Fracture Treated With Immobilization The humerus is the large bone in your upper arm. You have a broken (fractured) humerus. These fractures are easily diagnosed with X-rays. TREATMENT  Simple fractures which will heal without disability are treated with simple immobilization. Immobilization means you will wear a cast, splint, or sling. You have a fracture which will do well with immobilization. The fracture will heal well simply by being held in a good position until it is stable enough to begin range of motion exercises. Do not take part in activities which would further injure your arm.  HOME CARE INSTRUCTIONS   Put ice on the injured area.  Put ice in a plastic bag.  Place a towel between your skin and the bag.  Leave the ice on for 15-20 minutes, 03-04 times a day.  If you have a cast:  Do not scratch the skin under the cast using sharp or pointed objects.  Check the skin around the cast every day. You may put lotion on any red or sore areas.  Keep your cast dry and clean.  If you have a splint:  Wear the splint as directed.  Keep your splint dry and clean.  You may loosen the elastic around the splint if your fingers become numb, tingle, or turn cold or blue.  If you have a sling:  Wear the sling as directed.  Do not put pressure on any part of your cast or splint until it is fully hardened.  Your cast or splint can be protected during bathing with a plastic bag. Do not lower the cast or splint into water.  Only take over-the-counter or prescription medicines for pain, discomfort, or fever as directed by your caregiver.  Do range of motion exercises as instructed by your  caregiver.  Follow up as directed by your caregiver. This is very important in order to avoid permanent injury or disability and chronic pain. SEEK IMMEDIATE MEDICAL CARE IF:   Your skin or nails in the injured arm turn blue or gray.  Your arm feels cold or numb.  You develop severe pain in the injured arm.  You are having problems with the medicines you were given. MAKE SURE YOU:   Understand these instructions.  Will watch your condition.  Will get help right away if you are not doing well or get worse.   This information is not intended to replace advice given to you by your health care provider. Make sure you discuss any questions you have with your health care provider.   Document Released: 10/01/2000 Document Revised: 07/16/2014 Document Reviewed: 11/17/2014 Elsevier Interactive Patient Education [employer].

## 2015-12-19 NOTE — ED Notes (Signed)
Pt's daughter would like to be called for any changes in pt status. [name] [phone]

## 2015-12-19 NOTE — Telephone Encounter (Signed)
Last filled 11-21-15 #120 Last OV 10-27-15 No Future OV set

## 2015-12-19 NOTE — Telephone Encounter (Signed)
Spoke to pt. RX is up front for pickup.  She said she has a rash on her forehead that is itching. I advised her she needed an OV. She will talk to her driver and see when she can make an OV

## 2015-12-19 NOTE — ED Notes (Signed)
Per EMS: Pt coming from home. Pt caught right foot on curb. Pt c/o of pain to left shoulder, right foot, right knee, and abrasion to right elbow. Pt denies hitting head/LOC. Pt's vitals BP 142/62, HR 80. Pt has hx of irregular heart rate

## 2015-12-20 ENCOUNTER — Telehealth: Payer: Self-pay

## 2015-12-20 DIAGNOSIS — S42309A Unspecified fracture of shaft of humerus, unspecified arm, initial encounter for closed fracture: Secondary | ICD-10-CM | POA: Insufficient documentation

## 2015-12-20 DIAGNOSIS — S42209A Unspecified fracture of upper end of unspecified humerus, initial encounter for closed fracture: Secondary | ICD-10-CM | POA: Insufficient documentation

## 2015-12-20 DIAGNOSIS — S42215A Unspecified nondisplaced fracture of surgical neck of left humerus, initial encounter for closed fracture: Secondary | ICD-10-CM | POA: Diagnosis not present

## 2015-12-20 NOTE — Telephone Encounter (Signed)
Called patient to see how she is doing and to see if she has been set up with an orthopedic specialist. She is scheduled to be here 12-23-15

## 2015-12-20 NOTE — ED Notes (Signed)

## 2015-12-22 NOTE — Telephone Encounter (Signed)
Pt is scheduled for ER FU tomorrow

## 2015-12-23 ENCOUNTER — Ambulatory Visit (INDEPENDENT_AMBULATORY_CARE_PROVIDER_SITE_OTHER): Payer: PPO | Admitting: Internal Medicine

## 2015-12-23 ENCOUNTER — Encounter: Payer: Self-pay | Admitting: Internal Medicine

## 2015-12-23 VITALS — BP 110/80 | HR 78 | Temp 98.0°F | Wt 91.0 lb

## 2015-12-23 DIAGNOSIS — B0229 Other postherpetic nervous system involvement: Secondary | ICD-10-CM

## 2015-12-23 DIAGNOSIS — J301 Allergic rhinitis due to pollen: Secondary | ICD-10-CM | POA: Diagnosis not present

## 2015-12-23 DIAGNOSIS — S42302A Unspecified fracture of shaft of humerus, left arm, initial encounter for closed fracture: Secondary | ICD-10-CM | POA: Diagnosis not present

## 2015-12-23 LAB — TOXASSURE SELECT 13 (MW), URINE: PDF: 0

## 2015-12-23 NOTE — Progress Notes (Signed)
Pre visit review using our clinic review tool, if applicable. No additional management support is needed unless otherwise documented below in the visit note. 

## 2015-12-23 NOTE — Assessment & Plan Note (Signed)
In sling Extensive bruising in arm and hand Has ortho follow up

## 2015-12-23 NOTE — Assessment & Plan Note (Addendum)
Working with Dr Primus Bravo one time--but he referred to [facility] but she doesn't want to go Doing some better with increased regimen

## 2015-12-23 NOTE — Progress Notes (Signed)
Subjective:    Patient ID: [name], female    DOB: [birth year], 82 y.o.   MRN: [id number]  HPI Here with daughter for ER visit [facility]--doesn't see well (right eye sewed shut) Tripped on crack in sidewalk Fracture of left humeral head Had ortho follow up 3 days ago In sling --- or just elevated Pain not bad while in sling---has the morphine which helps if needed  Right facial pain and eye symptoms not much better since the procedure Eye not as irritated though (since shut)  Has head cold Running eyes and nose Goes back 2 weeks of so Some irritation in throat No fever Does have feeling of SOB and dizziness (not new) Still on flonase (per ENT) Some ear pain and itching  Current Outpatient Prescriptions on File Prior to Visit  Medication Sig Dispense Refill  . acyclovir (ZOVIRAX) 800 MG tablet TAKE 1 TABLET BY MOUTH 4 TIMES A DAY 120 tablet 11  . aspirin 81 MG tablet Take 81 mg by mouth daily.      . fluticasone (FLONASE) 50 MCG/ACT nasal spray Place 2 sprays into both nostrils daily.    [name] LORazepam (ATIVAN) 1 MG tablet TAKE 1/2 TO 1 TABLET BY MOUTH AT BEDTIMEAS NEEDED 30 tablet 0  . morphine (MSIR) 15 MG tablet TAKE 1 TABLET BY MOUTH EVERY 4 HOURS AS NEEDED FOR PAIN 120 tablet 0  . neomycin-bacitracin-polymyxin (NEOSPORIN) ointment Apply 1 application topically every 12 (twelve) hours. apply to eye    . pantoprazole (PROTONIX) 40 MG tablet TAKE 1 TABLET BY MOUTH TWICE A DAY 60 tablet 11  . Polyethyl Glycol-Propyl Glycol (SYSTANE) 0.4-0.3 % SOLN Apply 1 application to eye 2 (two) times daily.    [name] topiramate (TOPAMAX) 100 MG tablet TAKE 1 TABLET BY MOUTH TWICE A DAY 60 tablet 11   No current facility-administered medications on file prior to visit.    Allergies  Allergen Reactions  . Duloxetine     REACTION: N/V Mental status change and trouble with balance  . Cymbalta [Duloxetine Hcl]     Unsure of reaction  . Maxitrol [Neomycin-Polymyxin-Dexameth]      Unsure of reaction  . Pregabalin     REACTION: SOB, swollen lips  . Tobradex [Tobramycin-Dexamethasone]     Unsure of reaction    Past Medical History  Diagnosis Date  . Breast cancer ([place])     Bilateral mastectomies 1986.  [name] GERD (gastroesophageal reflux disease)   . Hyperlipidemia   . Arthritis   . Depression   . Zoster      with Post-herpetic neuralgia  . History of partial thyroidectomy   . Dyspnea     Echo (5/11) was a difficult study due to breast implants but showed normal LV and RV size and systolic function.      [name] Atypical chest pain     Lexiscan myoview (5/11) with EF 84%, normal wall motion, small fixed apical  perfusion defect likely due to breast attenuation, no evidence for ischemia or infarction.  **Patient had an  . CVA (cerebral infarction) 10/12    right lacunar  . Obstruction of intestine ([place])   . Partial small bowel obstruction ([place]) 7/14    no surgery  . Hypertension     in past - no current meds/issues  . Wears dentures     full upper  . Headache     s/p shingles - right side of head    Past Surgical History  Procedure  Laterality Date  . Abdominal hysterectomy    . Mastectomy  1980    bilateral  . Vaginal delivery    . Neck surgery    . Tarsorrhaphy Bilateral 08/16/2015    Procedure: MINOR TARSORRAPHY LATERAL PLACEMENT;  Surgeon: Karle Starch, MD;  Location: [place];  Service: Ophthalmology;  Laterality: Bilateral;  . Breast surgery Bilateral     cancer - mastectomy and implant insertion    No family history on file.  Social History   Social History  . Marital Status: Widowed    Spouse Name: N/A  . Number of Children: 2  . 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 Smoking status: Never Smoker   . Smokeless tobacco: Never Used  . Alcohol Use: No  . Drug Use: No  . Sexual Activity: Not on file   Other Topics Concern  . Not on file   Social History  Narrative   No living will   No health care POA but requests daughter-in-law [name] to do this   Would like attempts at resuscitation   No feeding tube if cognitively unaware   Review of Systems Had swallowing test at [facility] per Dr Richardson Landry recently. No evidence of obstruction Appetite still not great--does eat though Saw Dr Primus Bravo at [facility] pain clinic    Objective:   Physical Exam  HENT:  Mouth/Throat: Oropharynx is clear and moist. No oropharyngeal exudate.  Sinus sensitivity   Neck: No thyromegaly present.  Pulmonary/Chest: Effort normal and breath sounds normal. No respiratory distress. She has no wheezes. She has no rales.  Lymphadenopathy:    She has no cervical adenopathy.          Assessment & Plan:

## 2015-12-23 NOTE — Assessment & Plan Note (Signed)
Upper respiratory symptoms seem more allergic  Will try adding loratadine

## 2015-12-25 NOTE — Progress Notes (Signed)
Quick Note:  Reviewed. ______ 

## 2015-12-26 ENCOUNTER — Telehealth: Payer: Self-pay | Admitting: Internal Medicine

## 2015-12-26 DIAGNOSIS — S42215A Unspecified nondisplaced fracture of surgical neck of left humerus, initial encounter for closed fracture: Secondary | ICD-10-CM | POA: Diagnosis not present

## 2016-01-09 DIAGNOSIS — H01119 Allergic dermatitis of unspecified eye, unspecified eyelid: Secondary | ICD-10-CM | POA: Diagnosis not present

## 2016-01-19 DIAGNOSIS — S42215D Unspecified nondisplaced fracture of surgical neck of left humerus, subsequent encounter for fracture with routine healing: Secondary | ICD-10-CM | POA: Diagnosis not present

## 2016-01-23 ENCOUNTER — Other Ambulatory Visit: Payer: Self-pay

## 2016-01-23 MED ORDER — MORPHINE SULFATE 15 MG PO TABS: 15.0000 mg | ORAL_TABLET | ORAL | Status: DC | PRN

## 2016-01-23 NOTE — Telephone Encounter (Signed)
Last filled 12-19-15 #120 Last OV 12-23-15 Next OV 02-24-16

## 2016-01-23 NOTE — Telephone Encounter (Signed)
Spoke to pt. RX up front for pickup 

## 2016-02-06 DIAGNOSIS — L300 Nummular dermatitis: Secondary | ICD-10-CM | POA: Diagnosis not present

## 2016-02-06 DIAGNOSIS — L981 Factitial dermatitis: Secondary | ICD-10-CM | POA: Diagnosis not present

## 2016-02-06 DIAGNOSIS — B0229 Other postherpetic nervous system involvement: Secondary | ICD-10-CM | POA: Diagnosis not present

## 2016-02-16 ENCOUNTER — Other Ambulatory Visit: Payer: Self-pay

## 2016-02-16 DIAGNOSIS — S42215D Unspecified nondisplaced fracture of surgical neck of left humerus, subsequent encounter for fracture with routine healing: Secondary | ICD-10-CM | POA: Diagnosis not present

## 2016-02-16 MED ORDER — MORPHINE SULFATE 15 MG PO TABS: 15.0000 mg | ORAL_TABLET | ORAL | 0 refills | Status: DC | PRN

## 2016-02-16 NOTE — Telephone Encounter (Signed)
Left message on vm that rx was up front ready for pickup

## 2016-02-16 NOTE — Telephone Encounter (Signed)
Last filled 01-23-16 Last OV 12-23-15 Next OV 02-24-16

## 2016-02-17 DIAGNOSIS — H01119 Allergic dermatitis of unspecified eye, unspecified eyelid: Secondary | ICD-10-CM | POA: Diagnosis not present

## 2016-02-20 DIAGNOSIS — B0229 Other postherpetic nervous system involvement: Secondary | ICD-10-CM | POA: Diagnosis not present

## 2016-02-20 DIAGNOSIS — L821 Other seborrheic keratosis: Secondary | ICD-10-CM | POA: Diagnosis not present

## 2016-02-24 ENCOUNTER — Ambulatory Visit: Payer: PPO | Admitting: Internal Medicine

## 2016-03-05 ENCOUNTER — Ambulatory Visit: Payer: PPO | Admitting: Internal Medicine

## 2016-03-06 ENCOUNTER — Ambulatory Visit (INDEPENDENT_AMBULATORY_CARE_PROVIDER_SITE_OTHER): Payer: PPO | Admitting: Internal Medicine

## 2016-03-06 ENCOUNTER — Encounter: Payer: Self-pay | Admitting: Internal Medicine

## 2016-03-06 VITALS — BP 154/62 | HR 64 | Temp 98.1°F | Wt 89.2 lb

## 2016-03-06 DIAGNOSIS — B0229 Other postherpetic nervous system involvement: Secondary | ICD-10-CM | POA: Diagnosis not present

## 2016-03-06 DIAGNOSIS — Z23 Encounter for immunization: Secondary | ICD-10-CM | POA: Diagnosis not present

## 2016-03-06 DIAGNOSIS — F39 Unspecified mood [affective] disorder: Secondary | ICD-10-CM

## 2016-03-06 DIAGNOSIS — E440 Moderate protein-calorie malnutrition: Secondary | ICD-10-CM | POA: Diagnosis not present

## 2016-03-06 DIAGNOSIS — G629 Polyneuropathy, unspecified: Secondary | ICD-10-CM

## 2016-03-06 MED ORDER — MORPHINE SULFATE 15 MG PO TABS: 15.0000 mg | ORAL_TABLET | ORAL | 0 refills | Status: DC | PRN

## 2016-03-06 NOTE — Assessment & Plan Note (Signed)
Will adjust the timing on the topamax--1st dose at 1PM, instead of 5

## 2016-03-06 NOTE — Progress Notes (Signed)
Subjective:    Patient ID: [name], female    DOB: 12-13-1927, 82 y.o.   MRN: [id number]  HPI Here for follow up of chronic medical condtions With daughter as usual  Humeral fracture has healed well Using arm again No therapy needed Being very careful when walking Right eye still sewed shut--has to leave it shut (or enucleated)  Remains on morphine and acyclovir 4 times per day Morphine helps pain for 1 hour or so only Does make her drowsy when she takes it  Ongoing painful right temple rash Saw dermatologist Tried topical doxepin--affected her balance, etc. She had to stop it  Current Outpatient Prescriptions on File Prior to Visit  Medication Sig Dispense Refill  . acyclovir (ZOVIRAX) 800 MG tablet TAKE 1 TABLET BY MOUTH 4 TIMES A DAY 120 tablet 11  . aspirin 81 MG tablet Take 81 mg by mouth daily.      . fluticasone (FLONASE) 50 MCG/ACT nasal spray Place 2 sprays into both nostrils daily.    [name] LORazepam (ATIVAN) 1 MG tablet TAKE 1/2 TO 1 TABLET BY MOUTH AT BEDTIMEAS NEEDED 30 tablet 0  . morphine (MSIR) 15 MG tablet Take 1 tablet (15 mg total) by mouth every 4 (four) hours as needed. for pain 120 tablet 0  . pantoprazole (PROTONIX) 40 MG tablet TAKE 1 TABLET BY MOUTH TWICE A DAY 60 tablet 11  . Polyethyl Glycol-Propyl Glycol (SYSTANE) 0.4-0.3 % SOLN Apply 1 application to eye 2 (two) times daily.    [name] topiramate (TOPAMAX) 100 MG tablet TAKE 1 TABLET BY MOUTH TWICE A DAY 60 tablet 11  . neomycin-bacitracin-polymyxin (NEOSPORIN) ointment Apply 1 application topically every 12 (twelve) hours. apply to eye     No current facility-administered medications on file prior to visit.     Allergies  Allergen Reactions  . Duloxetine     REACTION: N/V Mental status change and trouble with balance  . Cymbalta [Duloxetine Hcl]     Unsure of reaction  . Maxitrol [Neomycin-Polymyxin-Dexameth]     Unsure of reaction  . Pregabalin     REACTION: SOB, swollen lips  . Tobradex  [Tobramycin-Dexamethasone]     Unsure of reaction    Past Medical History:  Diagnosis Date  . Arthritis   . Atypical chest pain    Lexiscan myoview (5/11) with EF 84%, normal wall motion, small fixed apical  perfusion defect likely due to breast attenuation, no evidence for ischemia or infarction.  **Patient had an  . Breast cancer ([place])    Bilateral mastectomies 1986.  [name] CVA (cerebral infarction) 10/12   right lacunar  . Depression   . Dyspnea    Echo (5/11) was a difficult study due to breast implants but showed normal LV and RV size and systolic function.      [name] GERD (gastroesophageal reflux disease)   . Headache    s/p shingles - right side of head  . History of partial thyroidectomy   . Hyperlipidemia   . Hypertension    in past - no current meds/issues  . Obstruction of intestine ([place])   . Partial small bowel obstruction ([place]) 7/14   no surgery  . Wears dentures    full upper  . Zoster     with Post-herpetic neuralgia    Past Surgical History:  Procedure Laterality Date  . ABDOMINAL HYSTERECTOMY    . BREAST SURGERY Bilateral    cancer - mastectomy and implant insertion  . MASTECTOMY  1980  bilateral  . NECK SURGERY    . TARSORRHAPHY Bilateral 08/16/2015   Procedure: MINOR TARSORRAPHY LATERAL PLACEMENT;  Surgeon: Karle Starch, MD;  Location: [facility];  Service: Ophthalmology;  Laterality: Bilateral;  . VAGINAL DELIVERY      No family history on file.  Social History   Social History  . Marital status: Widowed    Spouse name: N/A  . Number of children: 2  . Years of education: N/A   Occupational History  . retired Special educational needs teacher Retired   Social History Main Topics  . Smoking status: Never Smoker  . Smokeless tobacco: Never Used  . Alcohol use No  . Drug use: No  . Sexual activity: Not on file   Other Topics Concern  . Not on file   Social History Narrative   No living will   No health care POA but requests daughter-in-law [name] to do  this   Would like attempts at resuscitation   No feeding tube if cognitively unaware   Review of Systems Sleeping okay Appetite not too bad--doesn't eat much at a time. Slow eater Drinks 2 boost a day    Objective:   Physical Exam  HENT:  Right canal and TM look fine 1 lesion on right temple---exquisite tenderness over scalp  Cardiovascular: Normal rate, regular rhythm and normal heart sounds.  Exam reveals no gallop.   No murmur heard. Pulmonary/Chest: Effort normal and breath sounds normal. No respiratory distress. She has no wheezes. She has no rales.  Musculoskeletal: She exhibits no edema.  Psychiatric: She has a normal mood and affect. Her behavior is normal.          Assessment & Plan:

## 2016-03-06 NOTE — Assessment & Plan Note (Signed)
Continues on snacks and supplements

## 2016-03-06 NOTE — Assessment & Plan Note (Signed)
Severe Will increase the morphine to every 3 hours as needed Doesn't tolerate any extended release products Checked CSRS--no other doctors

## 2016-03-06 NOTE — Progress Notes (Signed)
Pre visit review using our clinic review tool, if applicable. No additional management support is needed unless otherwise documented below in the visit note. 

## 2016-03-06 NOTE — Assessment & Plan Note (Signed)
Pain related Stable at this point Daughter is very supportive

## 2016-03-06 NOTE — Addendum Note (Signed)
Addended by: Pilar Grammes on: 03/06/2016 12:41 PM   Modules accepted: Orders

## 2016-03-14 ENCOUNTER — Other Ambulatory Visit: Payer: Self-pay | Admitting: Internal Medicine

## 2016-03-14 MED ORDER — PANTOPRAZOLE SODIUM 40 MG PO TBEC: 40.0000 mg | DELAYED_RELEASE_TABLET | Freq: Two times a day (BID) | ORAL | 11 refills | Status: DC

## 2016-03-14 NOTE — Addendum Note (Signed)
Addended by: Pilar Grammes on: 03/14/2016 10:00 AM   Modules accepted: Orders

## 2016-03-14 NOTE — Telephone Encounter (Signed)
For some reason, it keeps printing the pantoprazole rx instead of sending it electronically.

## 2016-03-14 NOTE — Addendum Note (Signed)
Addended by: Pilar Grammes on: 03/14/2016 10:05 AM   Modules accepted: Orders

## 2016-03-19 DIAGNOSIS — H1013 Acute atopic conjunctivitis, bilateral: Secondary | ICD-10-CM | POA: Diagnosis not present

## 2016-03-21 ENCOUNTER — Other Ambulatory Visit: Payer: Self-pay | Admitting: Internal Medicine

## 2016-03-21 MED ORDER — PANTOPRAZOLE SODIUM 40 MG PO TBEC: 40.0000 mg | DELAYED_RELEASE_TABLET | Freq: Two times a day (BID) | ORAL | 11 refills | Status: DC

## 2016-03-21 NOTE — Telephone Encounter (Signed)
I have tried to send this medication multple times. I called it on vm at the pharmacy

## 2016-04-05 ENCOUNTER — Encounter: Payer: Self-pay | Admitting: *Deleted

## 2016-04-16 DIAGNOSIS — H16231 Neurotrophic keratoconjunctivitis, right eye: Secondary | ICD-10-CM | POA: Diagnosis not present

## 2016-04-23 ENCOUNTER — Other Ambulatory Visit: Payer: Self-pay

## 2016-04-23 MED ORDER — MORPHINE SULFATE 15 MG PO TABS: 15.0000 mg | ORAL_TABLET | ORAL | 0 refills | Status: DC | PRN

## 2016-04-23 NOTE — Telephone Encounter (Signed)
Left message on vm per dpr that rx is up front ready for pickup 

## 2016-04-23 NOTE — Telephone Encounter (Signed)
Last filled 03-19-16 #150 Last OV 03-06-16 Next OV 06-06-16

## 2016-05-14 ENCOUNTER — Other Ambulatory Visit: Payer: Self-pay

## 2016-05-14 MED ORDER — LORAZEPAM 1 MG PO TABS: ORAL_TABLET | ORAL | 0 refills | Status: DC

## 2016-05-14 NOTE — Telephone Encounter (Signed)
Approved: 30 x 0 

## 2016-05-14 NOTE — Telephone Encounter (Signed)
Left refill on voice mail at pharmacy  

## 2016-05-14 NOTE — Telephone Encounter (Signed)
Last filled 04-10-16 #30 Last OV 03-06-16 Next OV 06-06-16

## 2016-05-24 ENCOUNTER — Other Ambulatory Visit: Payer: Self-pay | Admitting: Internal Medicine

## 2016-05-24 NOTE — Telephone Encounter (Signed)
Pt DIL [name] LVM at triage requesting status on morphine (MSIR) 15 MG tablet. Elizabeth states pharmacy has requested medication.   Please advise.

## 2016-05-24 NOTE — Telephone Encounter (Signed)
Last OV 03-06-16 Next OV 06-06-16

## 2016-05-24 NOTE — Telephone Encounter (Signed)
Opened in error

## 2016-05-25 MED ORDER — MORPHINE SULFATE 15 MG PO TABS: 15.0000 mg | ORAL_TABLET | ORAL | 0 refills | Status: DC | PRN

## 2016-05-25 NOTE — Telephone Encounter (Signed)
Spoke to [employer]. Rx up front ready for pickup

## 2016-05-28 ENCOUNTER — Telehealth: Payer: Self-pay

## 2016-05-28 NOTE — Telephone Encounter (Signed)
Received a fax from [facility] stating the medication is covered on the pt's 2017 plan at a quantity of #180 in 30 days. Gave a number for the pharmacy to call. I have faxed that to the pharmacy

## 2016-05-28 NOTE — Telephone Encounter (Signed)
Placed urgent PA on Cover My Meds. It can take up to 72 hours for a response

## 2016-05-29 ENCOUNTER — Encounter: Payer: Self-pay | Admitting: *Deleted

## 2016-05-30 ENCOUNTER — Encounter: Payer: Self-pay | Admitting: General Surgery

## 2016-05-30 ENCOUNTER — Ambulatory Visit: Payer: Self-pay

## 2016-05-30 ENCOUNTER — Ambulatory Visit: Payer: Self-pay | Admitting: General Surgery

## 2016-05-30 ENCOUNTER — Ambulatory Visit (INDEPENDENT_AMBULATORY_CARE_PROVIDER_SITE_OTHER): Payer: PPO | Admitting: General Surgery

## 2016-05-30 VITALS — BP 110/72 | HR 66 | Resp 12 | Ht 66.0 in | Wt 92.0 lb

## 2016-05-30 DIAGNOSIS — R1310 Dysphagia, unspecified: Secondary | ICD-10-CM

## 2016-05-30 NOTE — Progress Notes (Signed)
Patient ID: [name], female   DOB: June 06, 1928, 82 y.o.   MRN: [id number]  Chief Complaint  Patient presents with  . Follow-up    carotid ultrasound    HPI [name] is a 82 y.o. female.  Here today for follow up carotid ultrasound. No new complaints.  Denies any stroke like events.  She is here today with her daughter in law, [name]. I have reviewed the history of present illness with the patient.  HPI  Past Medical History:  Diagnosis Date  . Arthritis   . Atypical chest pain    Lexiscan myoview (5/11) with EF 84%, normal wall motion, small fixed apical  perfusion defect likely due to breast attenuation, no evidence for ischemia or infarction.  **Patient had an  . Breast cancer ([place])    Bilateral mastectomies 1986.  [name] CVA (cerebral infarction) 10/12   right lacunar  . Depression   . Dyspnea    Echo (5/11) was a difficult study due to breast implants but showed normal LV and RV size and systolic function.      [name] GERD (gastroesophageal reflux disease)   . Headache    s/p shingles - right side of head  . History of partial thyroidectomy   . Hyperlipidemia   . Hypertension    in past - no current meds/issues  . Obstruction of intestine   . Partial small bowel obstruction 7/14   no surgery  . Wears dentures    full upper  . Zoster     with Post-herpetic neuralgia    Past Surgical History:  Procedure Laterality Date  . ABDOMINAL HYSTERECTOMY    . BREAST SURGERY Bilateral    cancer - mastectomy and implant insertion  . MASTECTOMY  1980   bilateral  . NECK SURGERY    . TARSORRHAPHY Bilateral 08/16/2015   Procedure: MINOR TARSORRAPHY LATERAL PLACEMENT;  Surgeon: Karle Starch, MD;  Location: [facility];  Service: Ophthalmology;  Laterality: Bilateral;  . VAGINAL DELIVERY      No family history on file.  Social History Social History  Substance Use Topics  . Smoking status: Never Smoker  . Smokeless tobacco: Never Used  . Alcohol use No     Allergies  Allergen Reactions  . Duloxetine     REACTION: N/V Mental status change and trouble with balance  . Cymbalta [Duloxetine Hcl]     Unsure of reaction  . Maxitrol [Neomycin-Polymyxin-Dexameth]     Unsure of reaction  . Pregabalin     REACTION: SOB, swollen lips  . Tobradex [Tobramycin-Dexamethasone]     Unsure of reaction    Current Outpatient Prescriptions  Medication Sig Dispense Refill  . acyclovir (ZOVIRAX) 800 MG tablet TAKE 1 TABLET BY MOUTH 4 TIMES A DAY 120 tablet 11  . aspirin 81 MG tablet Take 81 mg by mouth daily.      . flurandrenolide (CORDRAN) 0.05 % lotion Apply topically daily.    . fluticasone (FLONASE) 50 MCG/ACT nasal spray Place 2 sprays into both nostrils daily.    [name] loratadine (CLARITIN) 10 MG tablet Take 10 mg by mouth 2 (two) times daily.    [name] LORazepam (ATIVAN) 1 MG tablet TAKE 1/2 TO 1 TABLET BY MOUTH AT BEDTIMEAS NEEDED 30 tablet 0  . morphine (MSIR) 15 MG tablet Take 1 tablet (15 mg total) by mouth every 3 (three) hours as needed. for pain 150 tablet 0  . neomycin-bacitracin-polymyxin (NEOSPORIN) ointment Apply 1 application topically every 12 (  twelve) hours. apply to eye    . pantoprazole (PROTONIX) 40 MG tablet TAKE 1 TABLET BY MOUTH TWICE A DAY 60 tablet 11  . pantoprazole (PROTONIX) 40 MG tablet Take 1 tablet (40 mg total) by mouth 2 (two) times daily. 60 tablet 11  . Polyethyl Glycol-Propyl Glycol (SYSTANE) 0.4-0.3 % SOLN Apply 1 application to eye 2 (two) times daily.    . polyethylene glycol (MIRALAX / GLYCOLAX) packet Take 17 g by mouth at bedtime.    . topiramate (TOPAMAX) 100 MG tablet TAKE 1 TABLET BY MOUTH TWICE A DAY 60 tablet 11   No current facility-administered medications for this visit.     Review of Systems Review of Systems  Constitutional: Negative.   Respiratory: Negative.   Cardiovascular: Negative.     Blood pressure 110/72, pulse 66, resp. rate 12, height 5\' 6"  (1.676 m), weight 92 lb (41.7 kg).  Physical  Exam Physical Exam  Constitutional: She is oriented to person, place, and time. She appears well-developed and well-nourished.  Eyes: Conjunctivae are normal. No scleral icterus.  Neck: Neck supple. No thyromegaly present.  Lymphadenopathy:    She has no cervical adenopathy.  Neurological: She is alert and oriented to person, place, and time.  Skin: Skin is warm and dry.  Psychiatric: Her behavior is normal.    Data Reviewed Notes reviewed Duplex carotid artery study again shows diffuse plaquing of both CC. Focal increased plaque noted in both bifurcations, right more than left. Estimated stenosis of ICA 50%, less so in ECA. 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 Compared to last study there is mild increase noted.    Plan    Follow up in six months with carotid ultrasound.     This information has been scribed by Gaspar Cola CMA.   Virgilio Broadhead G 06/05/2016, 5:48 AM

## 2016-05-30 NOTE — Patient Instructions (Addendum)
Follow up in six monthswith carotid ultrasound.

## 2016-06-04 ENCOUNTER — Other Ambulatory Visit: Payer: Self-pay

## 2016-06-04 NOTE — Telephone Encounter (Signed)
Too Early. Last filled 05-14-16 #30

## 2016-06-05 ENCOUNTER — Encounter: Payer: Self-pay | Admitting: General Surgery

## 2016-06-06 ENCOUNTER — Ambulatory Visit: Payer: PPO | Admitting: Internal Medicine

## 2016-06-07 ENCOUNTER — Ambulatory Visit (INDEPENDENT_AMBULATORY_CARE_PROVIDER_SITE_OTHER): Payer: PPO | Admitting: Internal Medicine

## 2016-06-07 ENCOUNTER — Encounter: Payer: Self-pay | Admitting: Internal Medicine

## 2016-06-07 VITALS — BP 140/84 | HR 58 | Temp 98.1°F | Wt 91.0 lb

## 2016-06-07 DIAGNOSIS — B0229 Other postherpetic nervous system involvement: Secondary | ICD-10-CM | POA: Diagnosis not present

## 2016-06-07 MED ORDER — LORAZEPAM 1 MG PO TABS: ORAL_TABLET | ORAL | 0 refills | Status: DC

## 2016-06-07 NOTE — Progress Notes (Signed)
Subjective:    Patient ID: [name], female    DOB: 1927/10/09, 82 y.o.   MRN: [id number]  HPI Here with daughter for follow up of chronic health conditons  Having right forehead and ear pain Knots on forehead--so tender Trouble hearing in the right ear Sticky stuff in ear and earlobe in the morning  Using the morphine only 4 times a day Will keep the Rx the same It does ease it off  Current Outpatient Prescriptions on File Prior to Visit  Medication Sig Dispense Refill  . acyclovir (ZOVIRAX) 800 MG tablet TAKE 1 TABLET BY MOUTH 4 TIMES A DAY 120 tablet 11  . aspirin 81 MG tablet Take 81 mg by mouth daily.      . fluticasone (FLONASE) 50 MCG/ACT nasal spray Place 2 sprays into both nostrils daily.    [name] loratadine (CLARITIN) 10 MG tablet Take 10 mg by mouth 2 (two) times daily.    [name] LORazepam (ATIVAN) 1 MG tablet TAKE 1/2 TO 1 TABLET BY MOUTH AT BEDTIMEAS NEEDED 30 tablet 0  . morphine (MSIR) 15 MG tablet Take 1 tablet (15 mg total) by mouth every 3 (three) hours as needed. for pain (Patient taking differently: Take 15 mg by mouth every 6 (six) hours as needed. for pain) 150 tablet 0  . pantoprazole (PROTONIX) 40 MG tablet Take 1 tablet (40 mg total) by mouth 2 (two) times daily. 60 tablet 11  . Polyethyl Glycol-Propyl Glycol (SYSTANE) 0.4-0.3 % SOLN Apply 1 application to eye 2 (two) times daily.    . polyethylene glycol (MIRALAX / GLYCOLAX) packet Take 17 g by mouth at bedtime.    . topiramate (TOPAMAX) 100 MG tablet TAKE 1 TABLET BY MOUTH TWICE A DAY 60 tablet 11   No current facility-administered medications on file prior to visit.     Allergies  Allergen Reactions  . Duloxetine     REACTION: N/V Mental status change and trouble with balance  . Cymbalta [Duloxetine Hcl]     Unsure of reaction  . Maxitrol [Neomycin-Polymyxin-Dexameth]     Unsure of reaction  . Pregabalin     REACTION: SOB, swollen lips  . Tobradex [Tobramycin-Dexamethasone]     Unsure of reaction     Past Medical History:  Diagnosis Date  . Arthritis   . Atypical chest pain    Lexiscan myoview (5/11) with EF 84%, normal wall motion, small fixed apical  perfusion defect likely due to breast attenuation, no evidence for ischemia or infarction.  **Patient had an  . Breast cancer ([place])    Bilateral mastectomies 1986.  [name] CVA (cerebral infarction) 10/12   right lacunar  . Depression   . Dyspnea    Echo (5/11) was a difficult study due to breast implants but showed normal LV and RV size and systolic function.      [name] GERD (gastroesophageal reflux disease)   . Headache    s/p shingles - right side of head  . History of partial thyroidectomy   . Hyperlipidemia   . Hypertension    in past - no current meds/issues  . Obstruction of intestine   . Partial small bowel obstruction 7/14   no surgery  . Wears dentures    full upper  . Zoster     with Post-herpetic neuralgia    Past Surgical History:  Procedure Laterality Date  . ABDOMINAL HYSTERECTOMY    . BREAST SURGERY Bilateral    cancer - mastectomy and implant insertion  .  MASTECTOMY  1980   bilateral  . NECK SURGERY    . TARSORRHAPHY Bilateral 08/16/2015   Procedure: MINOR TARSORRAPHY LATERAL PLACEMENT;  Surgeon: Karle Starch, MD;  Location: [facility];  Service: Ophthalmology;  Laterality: Bilateral;  . VAGINAL DELIVERY      No family history on file.  Social History   Social History  . Marital status: Widowed    Spouse name: N/A  . Number of children: 2  . Years of education: N/A   Occupational History  . retired Special educational needs teacher Retired   Social History Main Topics  . Smoking status: Never Smoker  . Smokeless tobacco: Never Used  . Alcohol use No  . Drug use: No  . Sexual activity: Not on file   Other Topics Concern  . Not on file   Social History Narrative   No living will   No health care POA but requests daughter-in-law [name] to do this   Would like attempts at resuscitation   No feeding  tube if cognitively unaware   Review of Systems Appetite fair Weight about the same Sleeps well    Objective:   Physical Exam  Constitutional: No distress.  HENT:  No scalp lesions  Neck: No thyromegaly present.  Cardiovascular: Normal rate, regular rhythm and normal heart sounds.  Exam reveals no gallop.   No murmur heard. Pulmonary/Chest: Effort normal and breath sounds normal. No respiratory distress. She has no wheezes. She has no rales.  Musculoskeletal: She exhibits no edema.  Lymphadenopathy:    She has no cervical adenopathy.          Assessment & Plan:

## 2016-06-07 NOTE — Progress Notes (Signed)
Pre visit review using our clinic review tool, if applicable. No additional management support is needed unless otherwise documented below in the visit note. 

## 2016-06-07 NOTE — Assessment & Plan Note (Signed)
Severe Gets some brief relief from the morphine Intolerant of all extended release narcotics Checked CSRS--no other Rx's

## 2016-06-29 DIAGNOSIS — M3501 Sicca syndrome with keratoconjunctivitis: Secondary | ICD-10-CM | POA: Diagnosis not present

## 2016-07-04 ENCOUNTER — Other Ambulatory Visit: Payer: Self-pay | Admitting: Internal Medicine

## 2016-07-04 NOTE — Telephone Encounter (Signed)
Last filled 05-25-16 #150 Last OV 06-07-16 Next OV 09-17-16

## 2016-07-04 NOTE — Telephone Encounter (Signed)
Spoke to pt. Rx up front ready for pickup 

## 2016-07-10 ENCOUNTER — Other Ambulatory Visit: Payer: Self-pay | Admitting: Internal Medicine

## 2016-07-10 NOTE — Telephone Encounter (Signed)
Last filled 06-11-16 #30 Last OV 06-07-16 Next OV 09-17-16

## 2016-07-10 NOTE — Telephone Encounter (Signed)
Left refill on voice mail at pharmacy  

## 2016-07-10 NOTE — Telephone Encounter (Signed)
Pharmacy line busy.

## 2016-07-10 NOTE — Telephone Encounter (Signed)
Approved: 30 x 0 

## 2016-08-08 ENCOUNTER — Other Ambulatory Visit: Payer: Self-pay | Admitting: Internal Medicine

## 2016-08-08 NOTE — Telephone Encounter (Signed)
Spoke to pt. Rxs up front ready for pickup. 

## 2016-08-08 NOTE — Telephone Encounter (Signed)
Lorazepam last filled 07-10-16 #30 Morphine last filled 07-04-16  Last OV  06-07-16 Next OV 09-17-16

## 2016-08-13 DIAGNOSIS — H16231 Neurotrophic keratoconjunctivitis, right eye: Secondary | ICD-10-CM | POA: Diagnosis not present

## 2016-08-25 ENCOUNTER — Other Ambulatory Visit: Payer: Self-pay | Admitting: Internal Medicine

## 2016-08-27 ENCOUNTER — Other Ambulatory Visit: Payer: Self-pay | Admitting: Internal Medicine

## 2016-09-07 ENCOUNTER — Other Ambulatory Visit: Payer: Self-pay | Admitting: Internal Medicine

## 2016-09-08 ENCOUNTER — Other Ambulatory Visit: Payer: Self-pay | Admitting: Internal Medicine

## 2016-09-10 ENCOUNTER — Other Ambulatory Visit: Payer: Self-pay | Admitting: Internal Medicine

## 2016-09-10 MED ORDER — ACYCLOVIR 800 MG PO TABS: 800.0000 mg | ORAL_TABLET | Freq: Four times a day (QID) | ORAL | 10 refills | Status: DC

## 2016-09-10 NOTE — Addendum Note (Signed)
Addended by: Modena Nunnery on: 09/10/2016 01:17 PM   Modules accepted: Orders

## 2016-09-10 NOTE — Telephone Encounter (Signed)
Rx called in as prescribed 

## 2016-09-10 NOTE — Telephone Encounter (Signed)
Px written for call in   Will refill 1 mo on pcp absence

## 2016-09-10 NOTE — Telephone Encounter (Signed)
Attempted to send Rx electronically, but Rx printed repeatedly. Rx called in to pharmacy

## 2016-09-10 NOTE — Telephone Encounter (Signed)
Last filled 07/2016. Last f/u OV 02/2016

## 2016-09-10 NOTE — Telephone Encounter (Signed)
Spoke to pharmacy who states pt never dropped off Rx. Sent electronically

## 2016-09-15 ENCOUNTER — Other Ambulatory Visit: Payer: Self-pay | Admitting: Internal Medicine

## 2016-09-17 ENCOUNTER — Ambulatory Visit: Payer: PPO | Admitting: Internal Medicine

## 2016-09-17 NOTE — Telephone Encounter (Signed)
Patient notified by telephone that script is up front ready for pickup. Advised patient that the office is closing today at 1:00.

## 2016-09-17 NOTE — Telephone Encounter (Signed)
Last Rx 08/08/2016. Last f/u 05/2016

## 2016-09-19 ENCOUNTER — Encounter: Payer: Self-pay | Admitting: Internal Medicine

## 2016-09-19 ENCOUNTER — Encounter (INDEPENDENT_AMBULATORY_CARE_PROVIDER_SITE_OTHER): Payer: Self-pay

## 2016-09-19 ENCOUNTER — Ambulatory Visit (INDEPENDENT_AMBULATORY_CARE_PROVIDER_SITE_OTHER): Payer: PPO | Admitting: Internal Medicine

## 2016-09-19 VITALS — BP 142/68 | HR 77 | Temp 97.7°F | Wt 89.5 lb

## 2016-09-19 DIAGNOSIS — F39 Unspecified mood [affective] disorder: Secondary | ICD-10-CM

## 2016-09-19 DIAGNOSIS — D696 Thrombocytopenia, unspecified: Secondary | ICD-10-CM

## 2016-09-19 DIAGNOSIS — G629 Polyneuropathy, unspecified: Secondary | ICD-10-CM

## 2016-09-19 DIAGNOSIS — I10 Essential (primary) hypertension: Secondary | ICD-10-CM

## 2016-09-19 DIAGNOSIS — E440 Moderate protein-calorie malnutrition: Secondary | ICD-10-CM

## 2016-09-19 LAB — COMPREHENSIVE METABOLIC PANEL
ALK PHOS: 56 U/L (ref 39–117)
ALT: 9 U/L (ref 0–35)
AST: 14 U/L (ref 0–37)
Albumin: 4.3 g/dL (ref 3.5–5.2)
BUN: 23 mg/dL (ref 6–23)
CALCIUM: 9.6 mg/dL (ref 8.4–10.5)
CO2: 26 meq/L (ref 19–32)
Chloride: 110 mEq/L (ref 96–112)
Creatinine, Ser: 0.89 mg/dL (ref 0.40–1.20)
GFR: 63.74 mL/min (ref >60.00)
GLUCOSE: 112 mg/dL — AB (ref 70–99)
POTASSIUM: 4.5 meq/L (ref 3.5–5.1)
Sodium: 144 mEq/L (ref 135–145)
Total Bilirubin: 0.4 mg/dL (ref 0.2–1.2)
Total Protein: 7.3 g/dL (ref 6.0–8.3)

## 2016-09-19 LAB — CBC WITH DIFFERENTIAL/PLATELET
Basophils Absolute: 0 10*3/uL (ref 0.0–0.1)
Basophils Relative: 0.7 % (ref 0.0–3.0)
EOS PCT: 0.3 % (ref 0.0–5.0)
Eosinophils Absolute: 0 10*3/uL (ref 0.0–0.7)
HEMATOCRIT: 38.7 % (ref 36.0–46.0)
Hemoglobin: 12.5 g/dL (ref 12.0–15.0)
LYMPHS ABS: 1.2 10*3/uL (ref 0.7–4.0)
LYMPHS PCT: 19.8 % (ref 12.0–46.0)
MCHC: 32.2 g/dL (ref 30.0–36.0)
MCV: 99.5 fl (ref 78.0–100.0)
MONOS PCT: 8.4 % (ref 3.0–12.0)
Monocytes Absolute: 0.5 10*3/uL (ref 0.1–1.0)
NEUTROS ABS: 4.4 10*3/uL (ref 1.4–7.7)
NEUTROS PCT: 70.8 % (ref 43.0–77.0)
PLATELETS: 137 10*3/uL — AB (ref 150.0–400.0)
RBC: 3.89 Mil/uL (ref 3.87–5.11)
RDW: 14.8 % (ref 11.5–15.5)
WBC: 6.3 10*3/uL (ref 4.0–10.5)

## 2016-09-19 LAB — T4, FREE: Free T4: 0.87 ng/dL (ref 0.60–1.60)

## 2016-09-19 LAB — VITAMIN B12: VITAMIN B 12: 500 pg/mL (ref 211–911)

## 2016-09-19 NOTE — Progress Notes (Signed)
Pre visit review using our clinic review tool, if applicable. No additional management support is needed unless otherwise documented below in the visit note. 

## 2016-09-19 NOTE — Assessment & Plan Note (Signed)
From ongoing shingles infection and PHN Continue the same regimen

## 2016-09-19 NOTE — Assessment & Plan Note (Signed)
Does take the ensure Daughter concerned she doesn't eat enough discussed

## 2016-09-19 NOTE — Progress Notes (Signed)
Subjective:    Patient ID: [name], female    DOB: 01/14/1928, 83 y.o.   MRN: [id number]  HPI Here for follow up of severe neuralgia and other chronic health conditions Daughter here as usual  Still has pain in forehead Ongoing dizziness--feels off balance Discussed using cane--she usually hold onto things at home Uses walker for stability in house also Having pain in both ears-- for 2-3 weeks Has some nasal congestion No fever  Ongoing pain issues Managing okay with pain meds Still itches Awakens with matting in right eye Has appt with eye doctor next week  Eating is still not great Weight down a bit Daughter notes she isn't eating enough Using 2-3 boost per day  No excessive bruising Does note some red blood on toilet paper--- strains some No hematuria  Current Outpatient Prescriptions on File Prior to Visit  Medication Sig Dispense Refill  . acyclovir (ZOVIRAX) 800 MG tablet TAKE 1 TABLET BY MOUTH 4 TIMES A DAY 120 tablet 0  . aspirin 81 MG tablet Take 81 mg by mouth daily.      . fluticasone (FLONASE) 50 MCG/ACT nasal spray Place 2 sprays into both nostrils daily.    [name] loratadine (CLARITIN) 10 MG tablet Take 10 mg by mouth 2 (two) times daily.    [name] LORazepam (ATIVAN) 1 MG tablet TAKE 1/2 TO 1 TABLET BY MOUTH EVERY NIGHT AT BEDTIME AS NEEDED 30 tablet 0  . morphine (MSIR) 15 MG tablet TAKE 1 TABLET BY MOUTH EVERY 3 HOURS AS NEEDED FOR PAIN 150 tablet 0  . pantoprazole (PROTONIX) 40 MG tablet Take 1 tablet (40 mg total) by mouth 2 (two) times daily. 60 tablet 11  . Polyethyl Glycol-Propyl Glycol (SYSTANE) 0.4-0.3 % SOLN Apply 1 application to eye 2 (two) times daily.    . polyethylene glycol (MIRALAX / GLYCOLAX) packet Take 17 g by mouth at bedtime.    . topiramate (TOPAMAX) 100 MG tablet TAKE 1 TABLET BY MOUTH TWICE A DAY 60 tablet 11   No current facility-administered medications on file prior to visit.     Allergies  Allergen Reactions  . Duloxetine    REACTION: N/V Mental status change and trouble with balance  . Cymbalta [Duloxetine Hcl]     Unsure of reaction  . Maxitrol [Neomycin-Polymyxin-Dexameth]     Unsure of reaction  . Pregabalin     REACTION: SOB, swollen lips  . Tobradex [Tobramycin-Dexamethasone]     Unsure of reaction    Past Medical History:  Diagnosis Date  . Arthritis   . Atypical chest pain    Lexiscan myoview (5/11) with EF 84%, normal wall motion, small fixed apical  perfusion defect likely due to breast attenuation, no evidence for ischemia or infarction.  **Patient had an  . Breast cancer ([place])    Bilateral mastectomies 1986.  [name] CVA (cerebral infarction) 10/12   right lacunar  . Depression   . Dyspnea    Echo (5/11) was a difficult study due to breast implants but showed normal LV and RV size and systolic function.      [name] GERD (gastroesophageal reflux disease)   . Headache    s/p shingles - right side of head  . History of partial thyroidectomy   . Hyperlipidemia   . Hypertension    in past - no current meds/issues  . Obstruction of intestine   . Partial small bowel obstruction 7/14   no surgery  . Wears dentures    full  upper  . Zoster     with Post-herpetic neuralgia    Past Surgical History:  Procedure Laterality Date  . ABDOMINAL HYSTERECTOMY    . BREAST SURGERY Bilateral    cancer - mastectomy and implant insertion  . MASTECTOMY  1980   bilateral  . NECK SURGERY    . TARSORRHAPHY Bilateral 08/16/2015   Procedure: MINOR TARSORRAPHY LATERAL PLACEMENT;  Surgeon: Karle Starch, MD;  Location: [facility];  Service: Ophthalmology;  Laterality: Bilateral;  . VAGINAL DELIVERY      No family history on file.  Social History   Social History  . Marital status: Widowed    Spouse name: N/A  . Number of children: 2  . Years of education: N/A   Occupational History  . retired Special educational needs teacher Retired   Social History Main Topics  . Smoking status: Never Smoker  . Smokeless tobacco:  Never Used  . Alcohol use No  . Drug use: No  . Sexual activity: Not on file   Other Topics Concern  . Not on file   Social History Narrative   No living will   No health care POA but requests daughter-in-law [name] to do this   Would like attempts at resuscitation   No feeding tube if cognitively unaware   Review of Systems Sleeps okay No chest pain Some DOE at times--feels it is from her head though    Objective:   Physical Exam  HENT:  TMs normal Very sensitive in forehead as usual No active lesions  Neck: Neck supple. No thyromegaly present.  Cardiovascular: Normal rate, regular rhythm and normal heart sounds.  Exam reveals no gallop.   No murmur heard. Pulmonary/Chest: Effort normal and breath sounds normal. No respiratory distress. She has no wheezes. She has no rales.  Abdominal: Soft. There is no tenderness.  Musculoskeletal: She exhibits no edema.  Lymphadenopathy:    She has no cervical adenopathy.  Psychiatric: She has a normal mood and affect. Her behavior is normal.          Assessment & Plan:

## 2016-09-19 NOTE — Assessment & Plan Note (Signed)
Mostly related to chronic pain Continue meds Reviewed CSRS--only Rx here

## 2016-09-19 NOTE — Assessment & Plan Note (Signed)
BP Readings from Last 3 Encounters:  09/19/16 (!) 142/68  06/07/16 140/84  05/30/16 110/72   Up a little No change appropriate given overall status and age

## 2016-09-20 ENCOUNTER — Encounter: Payer: Self-pay | Admitting: *Deleted

## 2016-09-20 DIAGNOSIS — D696 Thrombocytopenia, unspecified: Secondary | ICD-10-CM | POA: Insufficient documentation

## 2016-09-20 NOTE — Assessment & Plan Note (Signed)
Mild and chronic No pathologic bleeding No action for now

## 2016-09-24 ENCOUNTER — Other Ambulatory Visit: Payer: Self-pay | Admitting: Internal Medicine

## 2016-09-24 DIAGNOSIS — M3501 Sicca syndrome with keratoconjunctivitis: Secondary | ICD-10-CM | POA: Diagnosis not present

## 2016-10-08 ENCOUNTER — Other Ambulatory Visit: Payer: Self-pay | Admitting: Family Medicine

## 2016-10-08 NOTE — Telephone Encounter (Signed)
Left refill on voice mail at pharmacy  

## 2016-10-08 NOTE — Telephone Encounter (Signed)
Last filled 09-10-16 #30 Last OV 09-19-16 Next OV 01-03-17

## 2016-10-08 NOTE — Telephone Encounter (Signed)
Approved: 30 x 0 

## 2016-10-22 ENCOUNTER — Other Ambulatory Visit: Payer: Self-pay | Admitting: Internal Medicine

## 2016-10-23 ENCOUNTER — Encounter: Payer: Self-pay | Admitting: Internal Medicine

## 2016-10-23 ENCOUNTER — Other Ambulatory Visit: Payer: PPO

## 2016-10-23 DIAGNOSIS — Z0283 Encounter for blood-alcohol and blood-drug test: Secondary | ICD-10-CM

## 2016-10-23 NOTE — Telephone Encounter (Signed)
Spoke to pt and informed her Rx is available for pickup from the front desk. Pt advised third party unable to pickup 

## 2016-10-23 NOTE — Telephone Encounter (Signed)
Last Rx 09/17/2016. Last OV 09/19/2016

## 2016-10-28 LAB — TOXASSURE SELECT 13 (MW), URINE

## 2016-10-29 ENCOUNTER — Encounter: Payer: Self-pay | Admitting: *Deleted

## 2016-10-31 DIAGNOSIS — H01119 Allergic dermatitis of unspecified eye, unspecified eyelid: Secondary | ICD-10-CM | POA: Diagnosis not present

## 2016-11-06 ENCOUNTER — Other Ambulatory Visit: Payer: Self-pay | Admitting: Family Medicine

## 2016-11-07 NOTE — Telephone Encounter (Signed)
Left refill on voice mail at pharmacy  

## 2016-11-07 NOTE — Telephone Encounter (Signed)
Approved: 30 x 0 

## 2016-11-07 NOTE — Telephone Encounter (Signed)
Last refill 10/08/16 #20 0 refills. Last OV 09/19/16, ok to refill?

## 2016-11-08 NOTE — Telephone Encounter (Signed)
Pt calls to ck on lorazepam refill; spoke with Lovena Le at [facility] and she said med was delivered to pt on 11/07/16. Pt said she did not get delivery for lorazepam; pt will call [facility] and talk with Lovena Le.

## 2016-11-21 ENCOUNTER — Ambulatory Visit: Payer: Self-pay | Admitting: General Surgery

## 2016-11-26 ENCOUNTER — Other Ambulatory Visit: Payer: Self-pay | Admitting: Internal Medicine

## 2016-11-26 DIAGNOSIS — G8929 Other chronic pain: Secondary | ICD-10-CM

## 2016-11-26 NOTE — Telephone Encounter (Signed)
Last filled 10-24-16 #150 Last OV 09-19-16 Next OV 01-03-17. UDS up-to-date  Forward to Dr Darnell Level in Dr Alla German absence

## 2016-11-27 DIAGNOSIS — G8929 Other chronic pain: Secondary | ICD-10-CM | POA: Insufficient documentation

## 2016-11-27 NOTE — Telephone Encounter (Signed)
Spoke to pt. Rx up front ready for pickup 

## 2016-11-27 NOTE — Telephone Encounter (Signed)
Pt called checking on rx best number [phone]

## 2016-11-27 NOTE — Telephone Encounter (Signed)
I don't see where [name] CSRS recently checked - reviewed. Appropriate. Refilled med and in CMA box.

## 2016-12-04 ENCOUNTER — Other Ambulatory Visit: Payer: Self-pay | Admitting: Family Medicine

## 2016-12-04 NOTE — Telephone Encounter (Signed)
Too early- please have pharmacy call closer to day needed Thanks

## 2016-12-04 NOTE — Telephone Encounter (Signed)
Pharmacy informed to call back as request too early. They agreed to do so.

## 2016-12-04 NOTE — Telephone Encounter (Signed)
Last refill on 11/07/2016  #30 (request too early)? Last office visit on 09/19/2016

## 2016-12-06 ENCOUNTER — Other Ambulatory Visit: Payer: Self-pay | Admitting: Family Medicine

## 2016-12-07 NOTE — Telephone Encounter (Signed)
Approved: 30 x 0 

## 2016-12-07 NOTE — Telephone Encounter (Signed)
Left refill on voice mail at pharmacy  

## 2016-12-07 NOTE — Telephone Encounter (Signed)
Last filled 11-07-16 #30 Last OV 09-19-16 Next OV 01-03-17

## 2016-12-11 ENCOUNTER — Other Ambulatory Visit: Payer: Self-pay | Admitting: Internal Medicine

## 2016-12-11 MED ORDER — TOPIRAMATE 100 MG PO TABS: 100.0000 mg | ORAL_TABLET | Freq: Two times a day (BID) | ORAL | 11 refills | Status: DC

## 2016-12-11 NOTE — Addendum Note (Signed)
Addended by: Pilar Grammes on: 12/11/2016 10:02 AM   Modules accepted: Orders

## 2016-12-11 NOTE — Addendum Note (Signed)
Addended by: Pilar Grammes on: 12/11/2016 10:05 AM   Modules accepted: Orders

## 2016-12-12 ENCOUNTER — Ambulatory Visit (INDEPENDENT_AMBULATORY_CARE_PROVIDER_SITE_OTHER): Payer: PPO | Admitting: General Surgery

## 2016-12-12 ENCOUNTER — Other Ambulatory Visit: Payer: Self-pay

## 2016-12-12 ENCOUNTER — Encounter: Payer: Self-pay | Admitting: General Surgery

## 2016-12-12 VITALS — BP 126/54 | HR 74 | Resp 12 | Ht 66.0 in | Wt 89.0 lb

## 2016-12-12 DIAGNOSIS — I6529 Occlusion and stenosis of unspecified carotid artery: Secondary | ICD-10-CM

## 2016-12-12 NOTE — Progress Notes (Signed)
Patient ID: [name], female   DOB: 06-24-1928, 83 y.o.   MRN: [id number]  Chief Complaint  Patient presents with  . Follow-up    carotid u/s    HPI [name] is a 83 y.o. female.  Here today for follow up carotid ultrasound. She does admit to being dizzy when she bends over and the back of her neck hurts. She also states her swallowing is stable. She hurts below her ears as well. She did experience a fall last year.  HPI  Past Medical History:  Diagnosis Date  . Arthritis   . Atypical chest pain    Lexiscan myoview (5/11) with EF 84%, normal wall motion, small fixed apical  perfusion defect likely due to breast attenuation, no evidence for ischemia or infarction.  **Patient had an  . Breast cancer ([place])    Bilateral mastectomies 1986.  [name] CVA (cerebral infarction) 10/12   right lacunar  . Depression   . Dyspnea    Echo (5/11) was a difficult study due to breast implants but showed normal LV and RV size and systolic function.      [name] GERD (gastroesophageal reflux disease)   . Headache    s/p shingles - right side of head  . History of partial thyroidectomy   . Hyperlipidemia   . Hypertension    in past - no current meds/issues  . Obstruction of intestine ([place])   . Partial small bowel obstruction ([place]) 7/14   no surgery  . Wears dentures    full upper  . Zoster     with Post-herpetic neuralgia    Past Surgical History:  Procedure Laterality Date  . ABDOMINAL HYSTERECTOMY    . BREAST SURGERY Bilateral    cancer - mastectomy and implant insertion  . MASTECTOMY  1980   bilateral  . NECK SURGERY    . TARSORRHAPHY Bilateral 08/16/2015   Procedure: MINOR TARSORRAPHY LATERAL PLACEMENT;  Surgeon: Karle Starch, MD;  Location: [facility];  Service: Ophthalmology;  Laterality: Bilateral;  . VAGINAL DELIVERY      No family history on file.  Social History Social History  Substance Use Topics  . Smoking status: Never Smoker  . Smokeless tobacco: Never Used   . Alcohol use No    Allergies  Allergen Reactions  . Duloxetine     REACTION: N/V Mental status change and trouble with balance  . Cymbalta [Duloxetine Hcl]     Unsure of reaction  . Maxitrol [Neomycin-Polymyxin-Dexameth]     Unsure of reaction  . Pregabalin     REACTION: SOB, swollen lips  . Tobradex [Tobramycin-Dexamethasone]     Unsure of reaction    Current Outpatient Prescriptions  Medication Sig Dispense Refill  . acyclovir (ZOVIRAX) 800 MG tablet TAKE 1 TABLET BY MOUTH 4 TIMES A DAY 120 tablet 11  . aspirin 81 MG tablet Take 81 mg by mouth daily.      . fluticasone (FLONASE) 50 MCG/ACT nasal spray Place 2 sprays into both nostrils daily.    [name] loratadine (CLARITIN) 10 MG tablet Take 10 mg by mouth 2 (two) times daily.    [name] LORazepam (ATIVAN) 1 MG tablet TAKE 1/2 TO 1 TABLET BY MOUTH EVERY NIGHT AT BEDTIME AS NEEDED 30 tablet 0  . morphine (MSIR) 15 MG tablet TAKE 1 TABLET BY MOUTH EVERY 3 HOURS AS NEEDED FOR PAIN 150 tablet 0  . pantoprazole (PROTONIX) 40 MG tablet Take 1 tablet (40 mg total) by mouth 2 (  two) times daily. 60 tablet 11  . Polyethyl Glycol-Propyl Glycol (SYSTANE) 0.4-0.3 % SOLN Apply 1 application to eye 2 (two) times daily.    . polyethylene glycol (MIRALAX / GLYCOLAX) packet Take 17 g by mouth at bedtime.    . topiramate (TOPAMAX) 100 MG tablet Take 1 tablet (100 mg total) by mouth 2 (two) times daily. 60 tablet 11   No current facility-administered medications for this visit.     Review of Systems Review of Systems  Constitutional: Negative.   Respiratory: Negative.   Cardiovascular: Negative.   Neurological: Positive for dizziness.    Blood pressure (!) 126/54, pulse 74, resp. rate 12, height 5\' 6"  (1.676 m), weight 89 lb (40.4 kg).  Physical Exam Physical Exam  Constitutional: She is oriented to person, place, and time. She appears well-developed and well-nourished.  Neck: Neck supple.  Pulmonary/Chest: Effort normal.  Neurological: She is  alert and oriented to person, place, and time.  Skin: Skin is warm and dry.  Psychiatric: Her behavior is normal.    Data Reviewed Prior notes and [place] reviewed. Duplex study done today shows again moderate plaquing on both carotids, right more than left, Plaqueing appears stable compared to study of 6 mos ago. Stenosis 50% or less Assessment    Diffuse plaquing of bilateral carotid arteries- moderate stenosis noted on both carotid bifurcations, no significant increase noted since last visit 6 mos ago. Left carotid plaque appears slightly less   compared to right carotid. Stable exam otherwise    Plan    Follow up with Dr. Silvio Pate as needed. Does not need another carotid US for now as she has been asymptomatic and  Duplex study shows stable plaques. Counseled on being aware of stroke-like/TIA symptoms. Advised to call with any questions or concerns.     HPI, Physical Exam, Assessment and Plan have been scribed under the direction and in the presence of Mckinley Jewel, MD  Karie Fetch, RN  I have completed the exam and reviewed the above documentation for accuracy and completeness.  I agree with the above.  Haematologist has been used and any errors in dictation or transcription are unintentional.  Seeplaputhur G. Jamal Collin, M.D., F.A.C.S.  Junie Panning G 12/13/2016, 9:26 AM

## 2016-12-12 NOTE — Patient Instructions (Addendum)
  Follow up with Dr. Silvio Pate as needed. Does not need another carotid US for now. Counseled on being aware of stroke-like/TIA symptoms. Advised to call with any questions or concerns.

## 2017-01-02 DIAGNOSIS — M3501 Sicca syndrome with keratoconjunctivitis: Secondary | ICD-10-CM | POA: Diagnosis not present

## 2017-01-03 ENCOUNTER — Ambulatory Visit (INDEPENDENT_AMBULATORY_CARE_PROVIDER_SITE_OTHER): Payer: PPO | Admitting: Internal Medicine

## 2017-01-03 ENCOUNTER — Encounter: Payer: Self-pay | Admitting: Internal Medicine

## 2017-01-03 VITALS — BP 118/60 | HR 71 | Temp 97.9°F | Ht 64.5 in | Wt 88.0 lb

## 2017-01-03 DIAGNOSIS — B0229 Other postherpetic nervous system involvement: Secondary | ICD-10-CM

## 2017-01-03 DIAGNOSIS — D696 Thrombocytopenia, unspecified: Secondary | ICD-10-CM | POA: Diagnosis not present

## 2017-01-03 DIAGNOSIS — K2100 Gastro-esophageal reflux disease with esophagitis, without bleeding: Secondary | ICD-10-CM

## 2017-01-03 DIAGNOSIS — F1120 Opioid dependence, uncomplicated: Secondary | ICD-10-CM

## 2017-01-03 DIAGNOSIS — E440 Moderate protein-calorie malnutrition: Secondary | ICD-10-CM

## 2017-01-03 DIAGNOSIS — I10 Essential (primary) hypertension: Secondary | ICD-10-CM

## 2017-01-03 DIAGNOSIS — F39 Unspecified mood [affective] disorder: Secondary | ICD-10-CM | POA: Diagnosis not present

## 2017-01-03 DIAGNOSIS — Z0000 Encounter for general adult medical examination without abnormal findings: Secondary | ICD-10-CM

## 2017-01-03 MED ORDER — MORPHINE SULFATE 15 MG PO TABS: ORAL_TABLET | ORAL | 0 refills | Status: DC

## 2017-01-03 MED ORDER — LORAZEPAM 1 MG PO TABS: ORAL_TABLET | ORAL | 0 refills | Status: DC

## 2017-01-03 NOTE — Assessment & Plan Note (Signed)
Discussed her dependence but not addicted CSRS reviewed for a year---only from our office

## 2017-01-03 NOTE — Progress Notes (Signed)
Subjective:    Patient ID: [name], female    DOB: Jan 15, 1928, 83 y.o.   MRN: [id number]  HPI Here for Medicare wellness and follow up of chronic health conditions With daughter in law Reviewed form and advanced directives  Reviewed other doctors ---see list No tobacco or alcohol Vision decreased--only left eye----using cool compresses on this Hearing is poor Trying to exercise at senior center Doesn't drive but does her own instrumental ADLs (but not vacuuming or dusting) Some memory issues --daughter does finances, etc. No clear major progression Chronic mood issues No falls No ER visits in past year  Concerned about her feet and legs Has pain nightly especially Red areas and small sore on lateral right ankle Itchy also  Right ear still itchy and painful Feels like she wants to dig in there Still has reasonable control with the pain --from the morphine  Also some sore throat Trouble swallowing at times--but trouble with meat Is on the protonix bid already  Mood is okay No really depressed---daughter tries to engage her Goes to senior center regularly--- exercises and throwing balls, etc Nerves about the same--- lorazepam used regularly at night  Appetite is still spotty Weight down over this past year Does drink the boost  Current Outpatient Prescriptions on File Prior to Visit  Medication Sig Dispense Refill  . acyclovir (ZOVIRAX) 800 MG tablet TAKE 1 TABLET BY MOUTH 4 TIMES A DAY 120 tablet 11  . aspirin 81 MG tablet Take 81 mg by mouth daily.      . fluticasone (FLONASE) 50 MCG/ACT nasal spray Place 2 sprays into both nostrils daily.    [name] LORazepam (ATIVAN) 1 MG tablet TAKE 1/2 TO 1 TABLET BY MOUTH EVERY NIGHT AT BEDTIME AS NEEDED 30 tablet 0  . morphine (MSIR) 15 MG tablet TAKE 1 TABLET BY MOUTH EVERY 3 HOURS AS NEEDED FOR PAIN 150 tablet 0  . pantoprazole (PROTONIX) 40 MG tablet Take 1 tablet (40 mg total) by mouth 2 (two) times daily. 60 tablet 11  .  polyethylene glycol (MIRALAX / GLYCOLAX) packet Take 17 g by mouth at bedtime.    . topiramate (TOPAMAX) 100 MG tablet Take 1 tablet (100 mg total) by mouth 2 (two) times daily. 60 tablet 11   No current facility-administered medications on file prior to visit.     Allergies  Allergen Reactions  . Duloxetine     REACTION: N/V Mental status change and trouble with balance  . Cymbalta [Duloxetine Hcl]     Unsure of reaction  . Maxitrol [Neomycin-Polymyxin-Dexameth]     Unsure of reaction  . Pregabalin     REACTION: SOB, swollen lips  . Tobradex [Tobramycin-Dexamethasone]     Unsure of reaction    Past Medical History:  Diagnosis Date  . Arthritis   . Atypical chest pain    Lexiscan myoview (5/11) with EF 84%, normal wall motion, small fixed apical  perfusion defect likely due to breast attenuation, no evidence for ischemia or infarction.  **Patient had an  . Breast cancer ([place])    Bilateral mastectomies 1986.  [name] CVA (cerebral infarction) 10/12   right lacunar  . Depression   . Dyspnea    Echo (5/11) was a difficult study due to breast implants but showed normal LV and RV size and systolic function.      [name] GERD (gastroesophageal reflux disease)   . Headache    s/p shingles - right side of head  . History of partial  thyroidectomy   . Hyperlipidemia   . Hypertension    in past - no current meds/issues  . Obstruction of intestine ([place])   . Partial small bowel obstruction ([place]) 7/14   no surgery  . Wears dentures    full upper  . Zoster     with Post-herpetic neuralgia    Past Surgical History:  Procedure Laterality Date  . ABDOMINAL HYSTERECTOMY    . BREAST SURGERY Bilateral    cancer - mastectomy and implant insertion  . MASTECTOMY  1980   bilateral  . NECK SURGERY    . TARSORRHAPHY Bilateral 08/16/2015   Procedure: MINOR TARSORRAPHY LATERAL PLACEMENT;  Surgeon: Karle Starch, MD;  Location: [facility];  Service: Ophthalmology;  Laterality: Bilateral;  .  VAGINAL DELIVERY      No family history on file.  Social History   Social History  . Marital status: Widowed    Spouse name: N/A  . Number of children: 2  . Years of education: N/A   Occupational History  . retired Special educational needs teacher Retired   Social History Main Topics  . Smoking status: Never Smoker  . Smokeless tobacco: Never Used  . Alcohol use No  . Drug use: No  . Sexual activity: Not on file   Other Topics Concern  . Not on file   Social History Narrative   No living will   No health care POA but requests daughter-in-law [name] to do this   Would like attempts at resuscitation   No feeding tube if cognitively unaware   Review of Systems Still has orthostatic dizziness Has a national alert necklace now--for if she falls, etc Sleeps well Wears seat belt Bowels are slow--does use the miralax Gets some chest pain--from indigestion. Also clears throat No SOB Voids okay---still continent No other skin rash other than the discoloration on calves Easy peeling skin--bleeds easy     Objective:   Physical Exam  Constitutional: She is oriented to person, place, and time. No distress.  HENT:  Mouth/Throat: Oropharynx is clear and moist. No oropharyngeal exudate.  Eyes:  Right eyelid surgically closed  Neck: No thyromegaly present.  Cardiovascular: Normal rate, regular rhythm, normal heart sounds and intact distal pulses.  Exam reveals no gallop.   No murmur heard. Pulmonary/Chest: Effort normal and breath sounds normal. No respiratory distress. She has no wheezes. She has no rales.  Abdominal: Soft. There is no tenderness.  Musculoskeletal: She exhibits no edema.  Lymphadenopathy:    She has no cervical adenopathy.  Neurological: She is alert and oriented to person, place, and time.  Stated 2017 President-- "Zena Amos, (first stated Melania)" Then "Trump, Clinton" 100-? D-r-o-w Recall 3/3  Skin:  Slight lacy redness in calves. No true ulcer            Assessment & Plan:

## 2017-01-03 NOTE — Assessment & Plan Note (Signed)
Chronic dysthymia due to pain and anxiety Just uses lorazepam at night

## 2017-01-03 NOTE — Assessment & Plan Note (Signed)
BP Readings from Last 3 Encounters:  01/03/17 118/60  12/12/16 (!) 126/54  09/19/16 (!) 142/68   Okay without Rx

## 2017-01-03 NOTE — Assessment & Plan Note (Signed)
Ongoing symptoms despite bid PPI May need EGD/dilation if worsens

## 2017-01-03 NOTE — Assessment & Plan Note (Signed)
Severe chronic pain and recurrent shingles On acyclovir and pain management

## 2017-01-03 NOTE — Assessment & Plan Note (Signed)
Weight stabilized now Using supplements

## 2017-01-03 NOTE — Assessment & Plan Note (Signed)
Bleeds easy No further action about this

## 2017-01-03 NOTE — Assessment & Plan Note (Signed)
I have personally reviewed the Medicare Annual Wellness questionnaire and have noted  1. The patient's medical and social history  2. Their use of alcohol, tobacco or illicit drugs  3. Their current medications and supplements  4. The patient's functional ability including ADL's, fall risks, home safety risks and hearing or visual              impairment.  5. Diet and physical activities  6. Evidence for depression or mood disorders  The patients weight, height, BMI and visual acuity have been recorded in the chart  I have made referrals, counseling and provided education to the patient based review of the above and I have provided the pt with a written personalized care plan for preventive services.   I have provided you with a copy of your personalized plan for preventive services. Please take the time to review along with your updated medication list.  Yearly flu vaccine No cancer screening due to age Exercise/social exposure at senior center Some cognitive decline--DIL gives her support. Discussed hiring help also

## 2017-01-29 ENCOUNTER — Other Ambulatory Visit: Payer: Self-pay

## 2017-01-29 MED ORDER — FLUTICASONE PROPIONATE 50 MCG/ACT NA SUSP: 2.0000 | Freq: Every day | NASAL | 11 refills | Status: DC

## 2017-01-29 NOTE — Telephone Encounter (Signed)
Rx sent electronically.  

## 2017-02-05 ENCOUNTER — Other Ambulatory Visit: Payer: Self-pay | Admitting: Internal Medicine

## 2017-02-05 NOTE — Telephone Encounter (Signed)
Last filled 01-07-17 #30 Last OV 01-03-17 Next OV 04-08-17

## 2017-02-06 NOTE — Telephone Encounter (Signed)
Approved: 30 x 0 

## 2017-02-06 NOTE — Telephone Encounter (Signed)
Left refill on voice mail at pharmacy  

## 2017-02-11 ENCOUNTER — Other Ambulatory Visit: Payer: Self-pay | Admitting: Internal Medicine

## 2017-02-11 NOTE — Telephone Encounter (Signed)
Left message on vm up front per DPR. Rx up front ready for pickup

## 2017-02-11 NOTE — Telephone Encounter (Signed)
Last filled 01-07-17 #150 Last OV 01-03-17 Next OV 04-08-17 Last UDS 10-23-16

## 2017-02-22 DIAGNOSIS — J301 Allergic rhinitis due to pollen: Secondary | ICD-10-CM | POA: Diagnosis not present

## 2017-02-22 DIAGNOSIS — L299 Pruritus, unspecified: Secondary | ICD-10-CM | POA: Diagnosis not present

## 2017-03-06 DIAGNOSIS — H16231 Neurotrophic keratoconjunctivitis, right eye: Secondary | ICD-10-CM | POA: Diagnosis not present

## 2017-03-07 ENCOUNTER — Other Ambulatory Visit: Payer: Self-pay | Admitting: Internal Medicine

## 2017-03-07 NOTE — Telephone Encounter (Signed)
Approved: 30 x 0 

## 2017-03-07 NOTE — Telephone Encounter (Signed)
Last filled 02-06-17 #30 Last OV 01-03-17 Next OV 04-08-17

## 2017-03-07 NOTE — Telephone Encounter (Signed)
Left refill on voice mail at pharmacy  

## 2017-03-19 ENCOUNTER — Other Ambulatory Visit: Payer: Self-pay | Admitting: Internal Medicine

## 2017-03-19 NOTE — Telephone Encounter (Signed)
Spoke to pt and informed her Rx is available for pickup from the front desk 

## 2017-03-19 NOTE — Telephone Encounter (Signed)
Last Rx 02/11/2017. Last OV 12/2016

## 2017-04-08 ENCOUNTER — Other Ambulatory Visit: Payer: Self-pay | Admitting: Internal Medicine

## 2017-04-08 ENCOUNTER — Ambulatory Visit (INDEPENDENT_AMBULATORY_CARE_PROVIDER_SITE_OTHER): Payer: PPO | Admitting: Internal Medicine

## 2017-04-08 ENCOUNTER — Encounter: Payer: Self-pay | Admitting: Internal Medicine

## 2017-04-08 VITALS — BP 126/64 | HR 64 | Temp 97.3°F | Ht 65.0 in | Wt 88.0 lb

## 2017-04-08 DIAGNOSIS — B019 Varicella without complication: Secondary | ICD-10-CM

## 2017-04-08 DIAGNOSIS — B029 Zoster without complications: Secondary | ICD-10-CM

## 2017-04-08 DIAGNOSIS — N3000 Acute cystitis without hematuria: Secondary | ICD-10-CM

## 2017-04-08 DIAGNOSIS — Z23 Encounter for immunization: Secondary | ICD-10-CM

## 2017-04-08 DIAGNOSIS — N3090 Cystitis, unspecified without hematuria: Secondary | ICD-10-CM | POA: Insufficient documentation

## 2017-04-08 DIAGNOSIS — F1120 Opioid dependence, uncomplicated: Secondary | ICD-10-CM

## 2017-04-08 LAB — POC URINALSYSI DIPSTICK (AUTOMATED)
BILIRUBIN UA: NEGATIVE
GLUCOSE UA: NEGATIVE
KETONES UA: NEGATIVE
Nitrite, UA: NEGATIVE
PROTEIN UA: NEGATIVE
Spec Grav, UA: 1.01 (ref 1.010–1.025)
Urobilinogen, UA: 0.2 E.U./dL
pH, UA: 6 (ref 5.0–8.0)

## 2017-04-08 MED ORDER — PANTOPRAZOLE SODIUM 40 MG PO TBEC: 40.0000 mg | DELAYED_RELEASE_TABLET | Freq: Two times a day (BID) | ORAL | 11 refills | Status: DC

## 2017-04-08 MED ORDER — ACYCLOVIR 400 MG PO TABS: 400.0000 mg | ORAL_TABLET | Freq: Four times a day (QID) | ORAL | 11 refills | Status: DC

## 2017-04-08 MED ORDER — LORAZEPAM 1 MG PO TABS: ORAL_TABLET | ORAL | 0 refills | Status: DC

## 2017-04-08 MED ORDER — SULFAMETHOXAZOLE-TRIMETHOPRIM 400-80 MG PO TABS: 1.0000 | ORAL_TABLET | Freq: Two times a day (BID) | ORAL | 1 refills | Status: DC

## 2017-04-08 NOTE — Assessment & Plan Note (Signed)
Recurrent Having trouble swallowing the acyclovir---will try a lower dose

## 2017-04-08 NOTE — Assessment & Plan Note (Signed)
Not using all the morphine she is allowed Hasn't tolerated long acting formulations Ongoing pain Checked CSRS--nothing of concern

## 2017-04-08 NOTE — Assessment & Plan Note (Signed)
Mild symptoms for a few days 1+ leuk on urinalysis and mild tenderness Will try antibiotic for 3 days

## 2017-04-08 NOTE — Progress Notes (Signed)
Subjective:    Patient ID: [name], female    DOB: [birth year], 83 y.o.   MRN: [id number]  HPI Here with daughter for follow up of narcotic dependence Ongoing severe right sided facial itching and pain  Has been taking the MSIR 4 times most days Has not tolerated any long acting narcotic On the topiramate and acyclovir as well  Current Outpatient Prescriptions on File Prior to Visit  Medication Sig Dispense Refill  . acyclovir (ZOVIRAX) 800 MG tablet TAKE 1 TABLET BY MOUTH 4 TIMES A DAY 120 tablet 11  . aspirin 81 MG tablet Take 81 mg by mouth daily.      . fluticasone (FLONASE) 50 MCG/ACT nasal spray Place 2 sprays into both nostrils daily. 16 g 11  . LORazepam (ATIVAN) 1 MG tablet TAKE 1/2 TO 1 TABLET BY MOUTH EVERY NIGHT AT BEDTIME 30 tablet 0  . morphine (MSIR) 15 MG tablet TAKE 1 TABLET BY MOUTH EVERY 3 HOURS AS NEEDED FOR PAIN 150 tablet 0  . pantoprazole (PROTONIX) 40 MG tablet Take 1 tablet (40 mg total) by mouth 2 (two) times daily. 60 tablet 11  . polyethylene glycol (MIRALAX / GLYCOLAX) packet Take 17 g by mouth at bedtime.    . topiramate (TOPAMAX) 100 MG tablet Take 1 tablet (100 mg total) by mouth 2 (two) times daily. 60 tablet 11   No current facility-administered medications on file prior to visit.     Allergies  Allergen Reactions  . Duloxetine     REACTION: N/V Mental status change and trouble with balance  . Cymbalta [Duloxetine Hcl]     Unsure of reaction  . Maxitrol [Neomycin-Polymyxin-Dexameth]     Unsure of reaction  . Pregabalin     REACTION: SOB, swollen lips  . Tobradex [Tobramycin-Dexamethasone]     Unsure of reaction    Past Medical History:  Diagnosis Date  . Arthritis   . Atypical chest pain    Lexiscan myoview (5/11) with EF 84%, normal wall motion, small fixed apical  perfusion defect likely due to breast attenuation, no evidence for ischemia or infarction.  **Patient had an  . Breast cancer ([place])    Bilateral mastectomies 1986.    [name] CVA (cerebral infarction) 10/12   right lacunar  . Depression   . Dyspnea    Echo (5/11) was a difficult study due to breast implants but showed normal LV and RV size and systolic function.      [name] GERD (gastroesophageal reflux disease)   . Headache    s/p shingles - right side of head  . History of partial thyroidectomy   . Hyperlipidemia   . Hypertension    in past - no current meds/issues  . Obstruction of intestine ([place])   . Partial small bowel obstruction ([place]) 7/14   no surgery  . Wears dentures    full upper  . Zoster     with Post-herpetic neuralgia    Past Surgical History:  Procedure Laterality Date  . ABDOMINAL HYSTERECTOMY    . BREAST SURGERY Bilateral    cancer - mastectomy and implant insertion  . MASTECTOMY  1980   bilateral  . NECK SURGERY    . TARSORRHAPHY Bilateral 08/16/2015   Procedure: MINOR TARSORRAPHY LATERAL PLACEMENT;  Surgeon: Karle Starch, MD;  Location: [facility];  Service: Ophthalmology;  Laterality: Bilateral;  . VAGINAL DELIVERY      No family history on file.  Social History   Social History  .  Marital status: Widowed    Spouse name: N/A  . Number of children: 2  . Years of education: N/A   Occupational History  . retired Special educational needs teacher Retired   Social History Main Topics  . Smoking status: Never Smoker  . Smokeless tobacco: Never Used  . Alcohol use No  . Drug use: No  . Sexual activity: Not on file   Other Topics Concern  . Not on file   Social History Narrative   No living will   No health care POA but requests daughter-in-law [name] to do this   Would like attempts at resuscitation   No feeding tube if cognitively unaware   Review of Systems Appetite still not good--"not a whole lot of nothing" Does try to drink boost Hasn't lost any more weight Getting upper dentures realigned--hopefully will make it easier for her to chew Having some urinary pain    Objective:   Physical Exam  Constitutional:   Still uncomfortable with the facial pain  Abdominal:  Mild suprapubic tenderness          Assessment & Plan:

## 2017-04-08 NOTE — Addendum Note (Signed)
Addended by: Pilar Grammes on: 04/08/2017 05:54 PM   Modules accepted: Orders

## 2017-04-08 NOTE — Addendum Note (Signed)
Addended by: Verlene Mayer A on: 04/08/2017 05:26 PM   Modules accepted: Orders

## 2017-04-15 DIAGNOSIS — M3501 Sicca syndrome with keratoconjunctivitis: Secondary | ICD-10-CM | POA: Diagnosis not present

## 2017-04-26 ENCOUNTER — Other Ambulatory Visit: Payer: Self-pay | Admitting: Internal Medicine

## 2017-04-26 NOTE — Telephone Encounter (Signed)
RX printed and signed and given to SB 

## 2017-04-26 NOTE — Telephone Encounter (Signed)
Left message on vm that rx is up front ready for pickup

## 2017-04-26 NOTE — Telephone Encounter (Signed)
Last printed 03-19-17 #150 LAst OV 04-08-17 Next OV 07-15-17  Last UDS/CSA 10-23-16  Forward to [facility] in Dr Alla German absence

## 2017-05-02 ENCOUNTER — Other Ambulatory Visit: Payer: Self-pay | Admitting: Internal Medicine

## 2017-05-02 NOTE — Telephone Encounter (Signed)
Last filled 04-08-17 #30 Last OV 04-08-17 Next OV 07-15-17

## 2017-05-03 NOTE — Telephone Encounter (Signed)
Left refill on voice mail at pharmacy  

## 2017-05-03 NOTE — Telephone Encounter (Signed)
Approved: 30 x 0 

## 2017-05-13 DIAGNOSIS — M3501 Sicca syndrome with keratoconjunctivitis: Secondary | ICD-10-CM | POA: Diagnosis not present

## 2017-06-03 ENCOUNTER — Other Ambulatory Visit: Payer: Self-pay | Admitting: Internal Medicine

## 2017-06-03 NOTE — Telephone Encounter (Signed)
Last filled 05-07-17 #30 Last OV 04-08-17 Next OV 07-15-17

## 2017-06-03 NOTE — Telephone Encounter (Signed)
Left refill on voice mail at pharmacy  

## 2017-06-03 NOTE — Telephone Encounter (Signed)
Approved: 30 x 0 

## 2017-06-03 NOTE — Telephone Encounter (Signed)
Last filled 04-30-17 #150 Last OV 04-08-17 Next OV 07-15-17

## 2017-06-03 NOTE — Telephone Encounter (Signed)
Spoke to pt. Rx up front ready for pickup 

## 2017-07-06 ENCOUNTER — Other Ambulatory Visit: Payer: Self-pay | Admitting: Internal Medicine

## 2017-07-08 NOTE — Telephone Encounter (Signed)
Left refill on voice mail at pharmacy  

## 2017-07-08 NOTE — Telephone Encounter (Signed)
Ok to phone in Ativan 

## 2017-07-08 NOTE — Telephone Encounter (Signed)
Last filled 06-04-17 #30 Last OV 04-08-17 Next OV 07-22-17  Forwarding to [facility] in Dr Alla German absence. Please send back to [facility]. Thanks

## 2017-07-10 ENCOUNTER — Other Ambulatory Visit: Payer: Self-pay | Admitting: Internal Medicine

## 2017-07-11 ENCOUNTER — Other Ambulatory Visit: Payer: Self-pay | Admitting: Internal Medicine

## 2017-07-11 MED ORDER — MORPHINE SULFATE 15 MG PO TABS: ORAL_TABLET | ORAL | 0 refills | Status: DC

## 2017-07-11 NOTE — Telephone Encounter (Signed)
Copied from [place] [phone]. Topic: General - Other >> Jul 11, 2017  2:13 PM Darl Householder, RMA wrote: Reason for CRM: Medication refill request for Morphine 15 mg, please call pt when prescription is ready for pick up

## 2017-07-11 NOTE — Telephone Encounter (Signed)
Patient advised.  Rx left at front desk for pick up. 

## 2017-07-11 NOTE — Telephone Encounter (Addendum)
[name] pts daughter(DPR signed) came on phone and pt is completely out of morphine 15 mg tabs. [name] lives 5 miles from office and request to pick up today. I advised of the policy for requesting medications and refills and she said before they call in for med and always pick up rx same day. I explained we strive to fill meds asap but Dr Silvio Pate is also out of office this week and [name] said she was not aware of that but would appreciate getting rx this afternoon. Please advise. Morphine last printed # 150 on 06/03/17; last seen 04/08/17 and UDS 10/23/16.

## 2017-07-11 NOTE — Telephone Encounter (Signed)
Printed.  Thanks.  

## 2017-07-12 ENCOUNTER — Other Ambulatory Visit: Payer: Self-pay | Admitting: Internal Medicine

## 2017-07-15 ENCOUNTER — Other Ambulatory Visit: Payer: Self-pay | Admitting: Internal Medicine

## 2017-07-15 ENCOUNTER — Ambulatory Visit: Payer: PPO | Admitting: Internal Medicine

## 2017-07-15 NOTE — Telephone Encounter (Signed)
Left refill on voice mail at pharmacy  

## 2017-07-15 NOTE — Telephone Encounter (Signed)
Last filled 06-04-17 #30 Last OV 04-08-17 Next OV 07-22-17

## 2017-07-15 NOTE — Telephone Encounter (Signed)
Approved: 30 x 0 

## 2017-07-22 ENCOUNTER — Encounter: Payer: Self-pay | Admitting: Internal Medicine

## 2017-07-22 ENCOUNTER — Ambulatory Visit (INDEPENDENT_AMBULATORY_CARE_PROVIDER_SITE_OTHER): Payer: PPO | Admitting: Internal Medicine

## 2017-07-22 VITALS — BP 118/70 | HR 84 | Temp 97.8°F | Wt 87.5 lb

## 2017-07-22 DIAGNOSIS — F1120 Opioid dependence, uncomplicated: Secondary | ICD-10-CM | POA: Diagnosis not present

## 2017-07-22 DIAGNOSIS — K2100 Gastro-esophageal reflux disease with esophagitis, without bleeding: Secondary | ICD-10-CM

## 2017-07-22 DIAGNOSIS — B3731 Acute candidiasis of vulva and vagina: Secondary | ICD-10-CM

## 2017-07-22 DIAGNOSIS — B373 Candidiasis of vulva and vagina: Secondary | ICD-10-CM | POA: Diagnosis not present

## 2017-07-22 DIAGNOSIS — I739 Peripheral vascular disease, unspecified: Secondary | ICD-10-CM | POA: Diagnosis not present

## 2017-07-22 MED ORDER — FLUCONAZOLE 150 MG PO TABS: 150.0000 mg | ORAL_TABLET | Freq: Once | ORAL | 1 refills | Status: AC

## 2017-07-22 NOTE — Progress Notes (Signed)
Subjective:    Patient ID: [name], female    DOB: 1927-07-15, 84 y.o.   MRN: [id number]  HPI Here with daughter for follow up of chronic narcotic dependence She has some other concerns as well  Ongoing post herpetic pain This is some better lately but still hurts and itches daily  Now with some severe right foot pain--- goes back a while now Has places opening up on the skin and not healing  Current Outpatient Medications on File Prior to Visit  Medication Sig Dispense Refill  . acyclovir (ZOVIRAX) 400 MG tablet Take 1 tablet (400 mg total) by mouth 4 (four) times daily. 120 tablet 11  . aspirin 81 MG tablet Take 81 mg by mouth daily.      [name] azelastine (ASTELIN) 0.1 % nasal spray Place into both nostrils 2 (two) times daily. Use in each nostril as directed    . fluticasone (FLONASE) 50 MCG/ACT nasal spray Place 2 sprays into both nostrils daily. 16 g 11  . LORazepam (ATIVAN) 1 MG tablet TAKE 1/2 TO 1 TABLET BY MOUTH EVERY NIGHT AT BEDTIME 30 tablet 0  . morphine (MSIR) 15 MG tablet TAKE 1 TABLET EVERY 3 HOURS AS NEEDED FOR PAIN 150 tablet 0  . pantoprazole (PROTONIX) 40 MG tablet Take 1 tablet (40 mg total) by mouth 2 (two) times daily. 60 tablet 11  . polyethylene glycol (MIRALAX / GLYCOLAX) packet Take 17 g by mouth at bedtime.    . topiramate (TOPAMAX) 100 MG tablet Take 1 tablet (100 mg total) by mouth 2 (two) times daily. 60 tablet 11   No current facility-administered medications on file prior to visit.     Allergies  Allergen Reactions  . Duloxetine     REACTION: N/V Mental status change and trouble with balance  . Cymbalta [Duloxetine Hcl]     Unsure of reaction  . Maxitrol [Neomycin-Polymyxin-Dexameth]     Unsure of reaction  . Pregabalin     REACTION: SOB, swollen lips  . Tobradex [Tobramycin-Dexamethasone]     Unsure of reaction    Past Medical History:  Diagnosis Date  . Arthritis   . Atypical chest pain    Lexiscan myoview (5/11) with EF 84%,  normal wall motion, small fixed apical  perfusion defect likely due to breast attenuation, no evidence for ischemia or infarction.  **Patient had an  . Breast cancer ([place])    Bilateral mastectomies 1986.  [name] CVA (cerebral infarction) 10/12   right lacunar  . Depression   . Dyspnea    Echo (5/11) was a difficult study due to breast implants but showed normal LV and RV size and systolic function.      [name] GERD (gastroesophageal reflux disease)   . Headache    s/p shingles - right side of head  . History of partial thyroidectomy   . Hyperlipidemia   . Hypertension    in past - no current meds/issues  . Obstruction of intestine ([place])   . Partial small bowel obstruction ([place]) 7/14   no surgery  . Wears dentures    full upper  . Zoster     with Post-herpetic neuralgia    Past Surgical History:  Procedure Laterality Date  . ABDOMINAL HYSTERECTOMY    . BREAST SURGERY Bilateral    cancer - mastectomy and implant insertion  . MASTECTOMY  1980   bilateral  . NECK SURGERY    . TARSORRHAPHY Bilateral 08/16/2015   Procedure: MINOR TARSORRAPHY LATERAL PLACEMENT;  Surgeon: Karle Starch, MD;  Location: [facility];  Service: Ophthalmology;  Laterality: Bilateral;  . VAGINAL DELIVERY      History reviewed. No pertinent family history.  Social History   Socioeconomic History  . Marital status: Widowed    Spouse name: Not on file  . Number of children: 2  . Years of education: Not on file  . Highest education level: Not on file  Social Needs  . Financial resource strain: Not on file  . Food insecurity - worry: Not on file  . Food insecurity - inability: Not on file  . Transportation needs - medical: Not on file  . Transportation needs - non-medical: Not on file  Occupational History  . Occupation: retired Museum/gallery exhibitions officer: RETIRED  Tobacco Use  . Smoking status: Never Smoker  . Smokeless tobacco: Never Used  Substance and Sexual Activity  . Alcohol use: No     Alcohol/week: 0.0 oz  . Drug use: No  . Sexual activity: Not on file  Other Topics Concern  . Not on file  Social History Narrative   No living will   No health care POA but requests daughter-in-law [name] to do this   Would like attempts at resuscitation   No feeding tube if cognitively unaware   Review of Systems Some vaginal itching Some discharge as well---downplays it now No dysuria in past few days Some dysphagia for meats--despite the bid pantoprazole    Objective:   Physical Exam  Constitutional: No distress.  Cardiovascular:  Very faint DP pulse on right but has lateral malleolus ulcer  Musculoskeletal: She exhibits no edema.          Assessment & Plan:

## 2017-07-22 NOTE — Assessment & Plan Note (Signed)
Now with dysphagia Discussed evaluation by GI-- had seen Sankar in the past She isn't excited---but will make referral if worsens (or if she decides she is willing to proceed with this)

## 2017-07-22 NOTE — Assessment & Plan Note (Signed)
Urinary symptoms are better Try fluconazole and consider antibiotic if recurrent urinary symptoms

## 2017-07-22 NOTE — Assessment & Plan Note (Signed)
Exam features fairly classic for vascular etiology of her pain and ulcer--but she is reluctant for interventions Will first check ABI to confirm vascular eval is appropriate

## 2017-07-22 NOTE — Assessment & Plan Note (Signed)
Doing okay with the morphine Intolerant of multiple extended release narcotics CSRS reviewed--no concerns

## 2017-07-23 ENCOUNTER — Telehealth: Payer: Self-pay

## 2017-07-23 NOTE — Telephone Encounter (Signed)
Appt made for the patient and daughter in law notified.

## 2017-07-23 NOTE — Telephone Encounter (Signed)
Copied from [place] [id number]. Topic: Quick Communication - Office Called Patient >> Jul 23, 2017  9:42 AM Synthia Innocent wrote: Reason for CRM: called patient, requesting to call daughter in law [name] back at [phone] regarding scheduling for leg [place]. She gave nurse wrong number before.

## 2017-08-02 DIAGNOSIS — H16231 Neurotrophic keratoconjunctivitis, right eye: Secondary | ICD-10-CM | POA: Diagnosis not present

## 2017-08-03 ENCOUNTER — Other Ambulatory Visit: Payer: Self-pay | Admitting: Internal Medicine

## 2017-08-09 ENCOUNTER — Other Ambulatory Visit: Payer: Self-pay | Admitting: Internal Medicine

## 2017-08-09 NOTE — Telephone Encounter (Signed)
Okay to fill a little early If she needed more, I would probably increase allowance

## 2017-08-09 NOTE — Telephone Encounter (Signed)
Chris at [facility] left v/m that pt is requesting refill early on lorazepam; Gerald Stabs has spoken with care giver for pt and caregiver is going to put Lorazepam in a pill box and pt will not have access to the lorazepam bottle. FYI to Dr Silvio Pate.

## 2017-08-09 NOTE — Telephone Encounter (Signed)
Last filled 07-15-17 #30 Last OV 07-22-17 Next OV 10-21-17

## 2017-08-12 ENCOUNTER — Encounter: Payer: Self-pay | Admitting: Podiatry

## 2017-08-12 ENCOUNTER — Ambulatory Visit: Payer: PPO | Admitting: Podiatry

## 2017-08-12 ENCOUNTER — Ambulatory Visit (INDEPENDENT_AMBULATORY_CARE_PROVIDER_SITE_OTHER): Payer: PPO

## 2017-08-12 VITALS — BP 126/62 | HR 75 | Resp 16

## 2017-08-12 DIAGNOSIS — M7750 Other enthesopathy of unspecified foot: Secondary | ICD-10-CM

## 2017-08-12 DIAGNOSIS — L98491 Non-pressure chronic ulcer of skin of other sites limited to breakdown of skin: Secondary | ICD-10-CM | POA: Diagnosis not present

## 2017-08-12 NOTE — Progress Notes (Signed)
Subjective:  Patient ID: [name], female    DOB: December 07, 1927,  MRN: [id number] HPI Chief Complaint  Patient presents with  . Ankle Pain    Lateral ankle right - tender area x several years, worsened recently-gets open sore occasionally, shooting pains into leg, no treatment    84 y.o. female presents with the above complaint.     Past Medical History:  Diagnosis Date  . Arthritis   . Atypical chest pain    Lexiscan myoview (5/11) with EF 84%, normal wall motion, small fixed apical  perfusion defect likely due to breast attenuation, no evidence for ischemia or infarction.  **Patient had an  . Breast cancer ([place])    Bilateral mastectomies 1986.  [name] CVA (cerebral infarction) 10/12   right lacunar  . Depression   . Dyspnea    Echo (5/11) was a difficult study due to breast implants but showed normal LV and RV size and systolic function.      [name] GERD (gastroesophageal reflux disease)   . Headache    s/p shingles - right side of head  . History of partial thyroidectomy   . Hyperlipidemia   . Hypertension    in past - no current meds/issues  . Obstruction of intestine ([place])   . Partial small bowel obstruction ([place]) 7/14   no surgery  . Wears dentures    full upper  . Zoster     with Post-herpetic neuralgia   Past Surgical History:  Procedure Laterality Date  . ABDOMINAL HYSTERECTOMY    . BREAST SURGERY Bilateral    cancer - mastectomy and implant insertion  . MASTECTOMY  1980   bilateral  . NECK SURGERY    . TARSORRHAPHY Bilateral 08/16/2015   Procedure: MINOR TARSORRAPHY LATERAL PLACEMENT;  Surgeon: Karle Starch, MD;  Location: [facility];  Service: Ophthalmology;  Laterality: Bilateral;  . VAGINAL DELIVERY      Current Outpatient Medications:  .  acyclovir (ZOVIRAX) 400 MG tablet, Take 1 tablet (400 mg total) by mouth 4 (four) times daily., Disp: 120 tablet, Rfl: 11 .  aspirin 81 MG tablet, Take 81 mg by mouth daily.  , Disp: , Rfl:  .  azelastine  (ASTELIN) 0.1 % nasal spray, Place into both nostrils 2 (two) times daily. Use in each nostril as directed, Disp: , Rfl:  .  desonide (DESOWEN) 0.05 % ointment, , Disp: , Rfl:  .  fluticasone (FLONASE) 50 MCG/ACT nasal spray, Place 2 sprays into both nostrils daily., Disp: 16 g, Rfl: 11 .  LORazepam (ATIVAN) 1 MG tablet, TAKE 1/2 TO 1 TABLET BY MOUTH EVERY NIGHT AT BEDTIME, Disp: 30 tablet, Rfl: 0 .  morphine (MSIR) 15 MG tablet, TAKE 1 TABLET EVERY 3 HOURS AS NEEDED FOR PAIN, Disp: 150 tablet, Rfl: 0 .  pantoprazole (PROTONIX) 40 MG tablet, Take 1 tablet (40 mg total) by mouth 2 (two) times daily., Disp: 60 tablet, Rfl: 11 .  polyethylene glycol (MIRALAX / GLYCOLAX) packet, Take 17 g by mouth at bedtime., Disp: , Rfl:  .  topiramate (TOPAMAX) 100 MG tablet, Take 1 tablet (100 mg total) by mouth 2 (two) times daily., Disp: 60 tablet, Rfl: 11  Allergies  Allergen Reactions  . Duloxetine     REACTION: N/V Mental status change and trouble with balance  . Cymbalta [Duloxetine Hcl]     Unsure of reaction  . Maxitrol [Neomycin-Polymyxin-Dexameth]     Unsure of reaction  . Pregabalin     REACTION: SOB,  swollen lips  . Tobradex [Tobramycin-Dexamethasone]     Unsure of reaction   Review of Systems  Eyes: Positive for pain, redness, itching and visual disturbance.  Neurological: Positive for dizziness, light-headedness and headaches.  Hematological: Bruises/bleeds easily.  All other systems reviewed and are negative.  Objective:   Vitals:   08/12/17 0954  BP: 126/62  Pulse: 75  Resp: 16    General: Well developed, under nourished in no acute distress, alert and oriented x3   Dermatological: Skin is warm, dry and supple bilateral. Nails x 10 are well maintained; remaining integument appears unremarkable at this time. There are no open sores, no preulcerative lesions, no rash or signs of infection present.  Currently the lesion on the lateral aspect of the ankle is not open.  It appears  to be healed over but currently it is not painful.   Vascular: Dorsalis Pedis artery and Posterior Tibial artery pedal pulses are 1 out of 4 bilateral with delayed capillary fill time. Pedal hair growth absent. No varicosities and no lower extremity edema present bilateral.   Neruologic: Grossly intact via light touch bilateral. Vibratory intact via tuning fork bilateral. Protective threshold with Semmes Wienstein monofilament intact to all pedal sites bilateral. Patellar and Achilles deep tendon reflexes 2+ bilateral. No Babinski or clonus noted bilateral.   Musculoskeletal: No gross boney pedal deformities bilateral. No pain, crepitus, or limitation noted with foot and ankle range of motion bilateral. Muscular strength 5/5 in all groups tested bilateral.  Gait: Unassisted, Nonantalgic.    Radiographs:  No acute findings  Assessment & Plan:   Assessment: Arterial ulcer lateral aspect right lateral malleolus.  Plan: Request arterial studies of the right lower extremity.  Also discussed nutritional status with her.     Jeffrie Lofstrom T. [place], [place]

## 2017-08-19 ENCOUNTER — Other Ambulatory Visit: Payer: Self-pay | Admitting: Family Medicine

## 2017-08-19 NOTE — Telephone Encounter (Signed)
Sent. Thanks.  Routed to PCP as FYI.

## 2017-08-19 NOTE — Telephone Encounter (Signed)
Electronic refill request.   Morphine Last office visit:   07/22/17 Last Filled:    150 tablet 0 07/11/2017  Please advise.    Dr. Silvio Pate will be returning to the office tomorrow.

## 2017-08-26 ENCOUNTER — Other Ambulatory Visit: Payer: Self-pay | Admitting: Internal Medicine

## 2017-08-26 ENCOUNTER — Other Ambulatory Visit: Payer: Self-pay | Admitting: Podiatry

## 2017-08-26 DIAGNOSIS — I739 Peripheral vascular disease, unspecified: Secondary | ICD-10-CM

## 2017-08-30 ENCOUNTER — Ambulatory Visit: Payer: Self-pay | Admitting: *Deleted

## 2017-08-30 ENCOUNTER — Other Ambulatory Visit: Payer: Self-pay | Admitting: Internal Medicine

## 2017-08-30 ENCOUNTER — Other Ambulatory Visit: Payer: Self-pay

## 2017-08-30 ENCOUNTER — Encounter: Payer: Self-pay | Admitting: Family Medicine

## 2017-08-30 ENCOUNTER — Ambulatory Visit (INDEPENDENT_AMBULATORY_CARE_PROVIDER_SITE_OTHER): Payer: PPO | Admitting: Family Medicine

## 2017-08-30 DIAGNOSIS — H5789 Other specified disorders of eye and adnexa: Secondary | ICD-10-CM | POA: Diagnosis not present

## 2017-08-30 DIAGNOSIS — B370 Candidal stomatitis: Secondary | ICD-10-CM | POA: Insufficient documentation

## 2017-08-30 MED ORDER — NYSTATIN 100000 UNIT/ML MT SUSP: 5.0000 mL | Freq: Four times a day (QID) | OROMUCOSAL | 1 refills | Status: DC

## 2017-08-30 NOTE — Patient Instructions (Addendum)
Use oral swish for yeast in mouth in mouth. Use for 48 hours after symptoms improve.  Can use cetaphil cream on skin.  Keep appt with eye MD Monday.

## 2017-08-30 NOTE — Assessment & Plan Note (Signed)
Nystatin swish and spit. 

## 2017-08-30 NOTE — Progress Notes (Addendum)
   Subjective:    Patient ID: [name], female    DOB: 10/20/1927, 84 y.o.   MRN: [id number]  Sore Throat   This is a new problem. The current episode started in the past 7 days. The problem has been gradually worsening. There has been no fever. Associated symptoms include ear pain, a hoarse voice, neck pain and trouble swallowing. Pertinent negatives include no coughing. Associated symptoms comments: Mouth and thrat are sore, teeth and gums are sore  discharge from eyes  lips, dry  tounge with discharge  sore in neck. Treatments tried: mucinex DM,allergy pill. The treatment provided mild relief.  Dental Pain      no recent antibiotics.  Social History /Family History/Past Medical History reviewed in detail and updated in EMR if needed. Blood pressure 100/60, pulse 72, temperature 98.6 F (37 C), temperature source Oral, height 5\' 5"  (1.651 m), weight 86 lb 4 oz (39.1 kg).   Review of Systems  HENT: Positive for ear pain, hoarse voice and trouble swallowing.   Respiratory: Negative for cough.   Musculoskeletal: Positive for neck pain.       Objective:   Physical Exam  Constitutional: Vital signs are normal. She appears well-developed and well-nourished. She is cooperative.  Non-toxic appearance. She does not appear ill. No distress.  Elderly female in NAD  HENT:  Head: Normocephalic.  Right Ear: Hearing, tympanic membrane, external ear and ear canal normal. Tympanic membrane is not erythematous, not retracted and not bulging.  Left Ear: Hearing, tympanic membrane, external ear and ear canal normal. Tympanic membrane is not erythematous, not retracted and not bulging.  Nose: No mucosal edema or rhinorrhea. Right sinus exhibits no maxillary sinus tenderness and no frontal sinus tenderness. Left sinus exhibits no maxillary sinus tenderness and no frontal sinus tenderness.  Mouth/Throat: Uvula is midline, oropharynx is clear and moist and mucous membranes are normal. No posterior  oropharyngeal edema, posterior oropharyngeal erythema or tonsillar abscesses.  Whit coating on tounge dry lips.   Eyes: Conjunctivae, EOM and lids are normal. Pupils are equal, round, and reactive to light. Lids are everted and swept, no foreign bodies found.   Right eye closed permanently, erythema around eyes and slight discharge from left eye.  Neck: Trachea normal and normal range of motion. Neck supple. Carotid bruit is not present. No thyroid mass and no thyromegaly present.  Cardiovascular: Normal rate, regular rhythm, S1 normal, S2 normal, normal heart sounds, intact distal pulses and normal pulses. Exam reveals no gallop and no friction rub.  No murmur heard. Pulmonary/Chest: Effort normal and breath sounds normal. No tachypnea. No respiratory distress. She has no decreased breath sounds. She has no wheezes. She has no rhonchi. She has no rales.  Abdominal: Soft. Normal appearance and bowel sounds are normal. There is no tenderness.  Neurological: She is alert.  Skin: Skin is warm, dry and intact. No rash noted.  Diffuse dry skin  Psychiatric: Her speech is normal and behavior is normal. Judgment and thought content normal. Her mood appears not anxious. Cognition and memory are normal. She does not exhibit a depressed mood.          Assessment & Plan:

## 2017-08-30 NOTE — Assessment & Plan Note (Signed)
May be due to viral  Vs sounds like more chronic issue per daughter.[name] Has follow up with eye MD MOnday.

## 2017-08-30 NOTE — Telephone Encounter (Signed)
  Called in c/o "having a mouth full of stuff".  "When I wake up every morning for the past week I feel like I have stuff in my throat".   "My tongue is white until I brush my teeth then it's gone".   After triaging her she denies having a cough.   Her eyes are matted closed in the mornings for the past week.     The agent made her an appt for today with Dr. Diona Browner at 4:00.   Reason for Disposition . Cough  Answer Assessment - Initial Assessment Questions 1. ONSET: "When did the cough begin?"      It's not a cough per pt.   My throat just feels full.   I'm having difficulty swallowing this week.   I can drink fluids ok but I'm having difficulty swallowing food.    I've been hoarse this week.    My eyes matted closed in the morning.    Every morning my tongue is coated with a white stuff.  I brush my teeth and it goes away.   I have a top plate.   Top of my mouth is sore. 2. SEVERITY: "How bad is the cough today?"      Not been on antibiotics recently. 3. RESPIRATORY DISTRESS: "Describe your breathing."      This week every morning when I get up I have trouble with getting this stuff off of my tongue.   Once it gets broke loose I'm ok.   I've not had a cold this whole winter. 4. FEVER: "Do you have a fever?" If so, ask: "What is your temperature, how was it measured, and when did it start?"     No fever 5. SPUTUM: "Describe the color of your sputum" (clear, white, yellow, green)     White stuff on my tongue. 6. HEMOPTYSIS: "Are you coughing up any blood?" If so ask: "How much?" (flecks, streaks, tablespoons, etc.)     No blood 7. CARDIAC HISTORY: "Do you have any history of heart disease?" (e.g., heart attack, congestive heart failure)      No heart history 8. LUNG HISTORY: "Do you have any history of lung disease?"  (e.g., pulmonary embolus, asthma, emphysema)     No lung problems. 9. PE RISK FACTORS: "Do you have a history of blood clots?" (or: recent major surgery, recent prolonged  travel, bedridden )     No recent surgery 10. OTHER SYMPTOMS: "Do you have any other symptoms?" (e.g., runny nose, wheezing, chest pain)       No URI symptoms 11. PREGNANCY: "Is there any chance you are pregnant?" "When was your last menstrual period?"       Not asked due to age 84. TRAVEL: "Have you traveled out of the country in the last month?" (e.g., travel history, exposures)       No exposure.  Protocols used: COUGH - ACUTE NON-PRODUCTIVE-A-AH, COUGH - ACUTE PRODUCTIVE-A-AH

## 2017-09-02 DIAGNOSIS — H16231 Neurotrophic keratoconjunctivitis, right eye: Secondary | ICD-10-CM | POA: Diagnosis not present

## 2017-09-04 ENCOUNTER — Ambulatory Visit (INDEPENDENT_AMBULATORY_CARE_PROVIDER_SITE_OTHER): Payer: PPO

## 2017-09-04 DIAGNOSIS — I739 Peripheral vascular disease, unspecified: Secondary | ICD-10-CM

## 2017-09-06 ENCOUNTER — Telehealth: Payer: Self-pay

## 2017-09-06 NOTE — Telephone Encounter (Signed)
[name] with [facility] left v/m that pt was taking nystatin suspension for thrush; pt requesting refill but nystatin suspension on back order and [name] wants to know if could substitute Mycelex troches for thrush.Please advise.

## 2017-09-06 NOTE — Telephone Encounter (Signed)
Yes.. Okay to make the change. 

## 2017-09-06 NOTE — Telephone Encounter (Addendum)
Chris at [facility] notified by telephone that it is okay to change to Mycelex Troches per Dr. Diona Browner.

## 2017-09-09 ENCOUNTER — Other Ambulatory Visit: Payer: Self-pay | Admitting: Internal Medicine

## 2017-09-09 NOTE — Telephone Encounter (Signed)
Last filled 08-09-17 #30 Last OV 08-30-17 Next OV 10-21-17  Forward to Dr Lorelei Pont in Dr Alla German absence

## 2017-09-17 ENCOUNTER — Telehealth: Payer: Self-pay | Admitting: *Deleted

## 2017-09-17 DIAGNOSIS — R0989 Other specified symptoms and signs involving the circulatory and respiratory systems: Secondary | ICD-10-CM

## 2017-09-17 NOTE — Telephone Encounter (Signed)
Left message for pt to call for circulation results.

## 2017-09-17 NOTE — Telephone Encounter (Signed)
-----   Message from Garrel Ridgel, [place] sent at 09/17/2017  7:44 AM EDT ----- Make sure she goes baack to vascular for consult.

## 2017-09-18 ENCOUNTER — Ambulatory Visit: Payer: PPO

## 2017-09-18 ENCOUNTER — Ambulatory Visit: Payer: PPO | Admitting: Podiatry

## 2017-09-18 DIAGNOSIS — M722 Plantar fascial fibromatosis: Secondary | ICD-10-CM

## 2017-09-18 MED ORDER — LIDOCAINE 5 % EX PTCH: 1.0000 | MEDICATED_PATCH | CUTANEOUS | 0 refills | Status: DC

## 2017-09-18 NOTE — Progress Notes (Signed)
She presents today for follow-up of her ulceration plantar aspect or lateral aspect of her right foot.  At this point she states that she has some pain in the right ankle.  Objective: Pulses remain palpable her ABIs demonstrate only microvascular disease there is no open wound or lesion noted there is a small area of erythema to the lateral aspect of the fibular malleolus.  Assessment: Malnourished very skinny arterial ulcer pain right.  Plan: Recommended a night boot as well as a prescription for lidocaine patch.

## 2017-09-19 ENCOUNTER — Encounter: Payer: Self-pay | Admitting: *Deleted

## 2017-09-19 NOTE — Telephone Encounter (Signed)
Left message requesting pt call for results of the circulation testing. Mailed note with Dr. Stephenie Acres review of results and recommendation to schedule consultation. I put referral in to [facility].

## 2017-09-27 ENCOUNTER — Other Ambulatory Visit: Payer: Self-pay | Admitting: Family Medicine

## 2017-09-27 ENCOUNTER — Other Ambulatory Visit: Payer: Self-pay

## 2017-09-27 NOTE — Telephone Encounter (Signed)
Landon with [facility] left v/m; daughter that takes care of filling pill boxes is going out of town next weekend and [facility] wants to know if can fill ativan early; daughter needs to fill pill box on 10/04/17 and not due to fill ativan until 10/07/17. Landon request cb.

## 2017-09-28 NOTE — Telephone Encounter (Signed)
Okay to fill it early----send 1 month on Monday and we will just monitor when next refill is requested (they are straight shooters)

## 2017-09-30 MED ORDER — LORAZEPAM 1 MG PO TABS: ORAL_TABLET | ORAL | 0 refills | Status: DC

## 2017-09-30 NOTE — Telephone Encounter (Signed)
I built the rx to be sent with note ok to fill early.

## 2017-10-14 DIAGNOSIS — H16231 Neurotrophic keratoconjunctivitis, right eye: Secondary | ICD-10-CM | POA: Diagnosis not present

## 2017-10-21 ENCOUNTER — Ambulatory Visit: Payer: PPO | Admitting: Internal Medicine

## 2017-10-28 ENCOUNTER — Ambulatory Visit (INDEPENDENT_AMBULATORY_CARE_PROVIDER_SITE_OTHER): Payer: PPO | Admitting: Internal Medicine

## 2017-10-28 ENCOUNTER — Encounter: Payer: Self-pay | Admitting: Internal Medicine

## 2017-10-28 VITALS — BP 132/76 | HR 74 | Temp 97.2°F | Ht 65.0 in | Wt 86.0 lb

## 2017-10-28 DIAGNOSIS — B0229 Other postherpetic nervous system involvement: Secondary | ICD-10-CM | POA: Diagnosis not present

## 2017-10-28 DIAGNOSIS — G629 Polyneuropathy, unspecified: Secondary | ICD-10-CM | POA: Diagnosis not present

## 2017-10-28 DIAGNOSIS — D696 Thrombocytopenia, unspecified: Secondary | ICD-10-CM | POA: Diagnosis not present

## 2017-10-28 DIAGNOSIS — F1120 Opioid dependence, uncomplicated: Secondary | ICD-10-CM | POA: Diagnosis not present

## 2017-10-28 DIAGNOSIS — F39 Unspecified mood [affective] disorder: Secondary | ICD-10-CM | POA: Diagnosis not present

## 2017-10-28 DIAGNOSIS — E440 Moderate protein-calorie malnutrition: Secondary | ICD-10-CM | POA: Diagnosis not present

## 2017-10-28 NOTE — Assessment & Plan Note (Signed)
Some anxiety and dysthymia--mostly from severe PHN Lorazepam at bedtime

## 2017-10-28 NOTE — Assessment & Plan Note (Signed)
Mild in the past Easy bruising Will recheck labs

## 2017-10-28 NOTE — Progress Notes (Signed)
Subjective:    Patient ID: [name], female    DOB: [birth year], 84 y.o.   MRN: [id number]  HPI Here with daughter for follow up of chronic pain and narcotic dependence  Still concerned with general pruritis Especially bad in head but all over  No rash other than right forehead Not clearly related to the morphine  Takes the lorazepam at night Seems to help (and may help the itching) Uses dove liquid soap--no change Itching into ears as well  Feels dizzy as well Feels unstable when walking Doesn't use cane Feet burn No orthostatic symptoms (though she "jerks out of chair" per daughter)  Still not eating that well Some boost and ice cream Needed 2 teeth pulled last week--- "that put me back" Weight is stable  Had ABI---this shows PAD Dr Milinda Pointer is continuing to address this Getting another test next week--no appt with vascular though  Ongoing mood issues Ongoing anxiety with the pain Some depression when lonely---nothing persistent  Using the morphine 4 times a day for the most part Never tolerated long acting medications  Bruises easy No abnormal bleeding  Current Outpatient Medications on File Prior to Visit  Medication Sig Dispense Refill  . acyclovir (ZOVIRAX) 400 MG tablet Take 1 tablet (400 mg total) by mouth 4 (four) times daily. 120 tablet 11  . aspirin 81 MG tablet Take 81 mg by mouth daily.      [name] azelastine (ASTELIN) 0.1 % nasal spray Place into both nostrils 2 (two) times daily. Use in each nostril as directed    . desonide (DESOWEN) 0.05 % ointment     . fluticasone (FLONASE) 50 MCG/ACT nasal spray Place 2 sprays into both nostrils daily. 16 g 11  . LORazepam (ATIVAN) 1 MG tablet TAKE 1/2 TO 1 TABLET BY MOUTH EVERY NIGHT AT BEDTIME 30 tablet 0  . morphine (MSIR) 15 MG tablet TAKE 1 TABLET BY MOUTH EVERY 3 HOURS AS NEEDED FOR PAIN 150 tablet 0  . nystatin (MYCOSTATIN) 100000 UNIT/ML suspension Take 5 mLs (500,000 Units total) by mouth 4 (four) times  daily. 100 mL 1  . pantoprazole (PROTONIX) 40 MG tablet Take 1 tablet (40 mg total) by mouth 2 (two) times daily. 60 tablet 11  . polyethylene glycol (MIRALAX / GLYCOLAX) packet Take 17 g by mouth at bedtime.    . topiramate (TOPAMAX) 100 MG tablet Take 1 tablet (100 mg total) by mouth 2 (two) times daily. 60 tablet 11   No current facility-administered medications on file prior to visit.     Allergies  Allergen Reactions  . Duloxetine     REACTION: N/V Mental status change and trouble with balance  . Cymbalta [Duloxetine Hcl]     Unsure of reaction  . Maxitrol [Neomycin-Polymyxin-Dexameth]     Unsure of reaction  . Pregabalin     REACTION: SOB, swollen lips  . Tobradex [Tobramycin-Dexamethasone]     Unsure of reaction    Past Medical History:  Diagnosis Date  . Arthritis   . Atypical chest pain    Lexiscan myoview (5/11) with EF 84%, normal wall motion, small fixed apical  perfusion defect likely due to breast attenuation, no evidence for ischemia or infarction.  **Patient had an  . Breast cancer ([place])    Bilateral mastectomies 1986.  [name] CVA (cerebral infarction) 10/12   right lacunar  . Depression   . Dyspnea    Echo (5/11) was a difficult study due to breast implants but showed normal LV  and RV size and systolic function.      [name] GERD (gastroesophageal reflux disease)   . Headache    s/p shingles - right side of head  . History of partial thyroidectomy   . Hyperlipidemia   . Hypertension    in past - no current meds/issues  . Obstruction of intestine ([place])   . Partial small bowel obstruction ([place]) 7/14   no surgery  . Wears dentures    full upper  . Zoster     with Post-herpetic neuralgia    Past Surgical History:  Procedure Laterality Date  . ABDOMINAL HYSTERECTOMY    . BREAST SURGERY Bilateral    cancer - mastectomy and implant insertion  . MASTECTOMY  1980   bilateral  . NECK SURGERY    . TARSORRHAPHY Bilateral 08/16/2015   Procedure: MINOR TARSORRAPHY  LATERAL PLACEMENT;  Surgeon: Karle Starch, MD;  Location: [facility];  Service: Ophthalmology;  Laterality: Bilateral;  . VAGINAL DELIVERY      No family history on file.  Social History   Socioeconomic History  . Marital status: Widowed    Spouse name: Not on file  . Number of children: 2  . Years of education: Not on file  . Highest education level: Not on file  Occupational History  . Occupation: retired Museum/gallery exhibitions officer: RETIRED  Social Needs  . Financial resource strain: Not on file  . Food insecurity:    Worry: Not on file    Inability: Not on file  . Transportation needs:    Medical: Not on file    Non-medical: Not on file  Tobacco Use  . Smoking status: Never Smoker  . Smokeless tobacco: Never Used  Substance and Sexual Activity  . Alcohol use: No    Alcohol/week: 0.0 oz  . Drug use: No  . Sexual activity: Not on file  Lifestyle  . Physical activity:    Days per week: Not on file    Minutes per session: Not on file  . Stress: Not on file  Relationships  . Social connections:    Talks on phone: Not on file    Gets together: Not on file    Attends religious service: Not on file    Active member of club or organization: Not on file    Attends meetings of clubs or organizations: Not on file    Relationship status: Not on file  . Intimate partner violence:    Fear of current or ex partner: Not on file    Emotionally abused: Not on file    Physically abused: Not on file    Forced sexual activity: Not on file  Other Topics Concern  . Not on file  Social History Narrative   No living will   No health care POA but requests daughter-in-law [name] to do this   Would like attempts at resuscitation   No feeding tube if cognitively unaware   Review of Systems  Sleeps okay Gets occasional feeling of SOB     Objective:   Physical Exam  Constitutional: No distress.  Clear muscle wasting  Neck: No thyromegaly present.  Cardiovascular: Normal  rate, regular rhythm and normal heart sounds. Exam reveals no gallop.  No murmur heard. Pulmonary/Chest: Effort normal and breath sounds normal. No respiratory distress. She has no wheezes. She has no rales.  Lymphadenopathy:    She has no cervical adenopathy.  Neurological:  Unstable walking--discussed cane (decreased sensation in feet)  Skin:  No rash--even on right forehead Right lateral malleolus is just scaly now--no ulcer          Assessment & Plan:

## 2017-10-28 NOTE — Assessment & Plan Note (Signed)
Her "dizziness" is actually related to balance from neuropathy Discussed using a cane regularly

## 2017-10-28 NOTE — Assessment & Plan Note (Signed)
Chronic severe pain requiring narcotics Has failed long acting preparations Ongoing itching, etc that seems to come from this

## 2017-10-28 NOTE — Assessment & Plan Note (Signed)
Reviewed CSRS---nothing of concern 

## 2017-10-28 NOTE — Assessment & Plan Note (Signed)
Weight is not up--but at least not down any more

## 2017-10-29 LAB — COMPREHENSIVE METABOLIC PANEL
ALBUMIN: 4.1 g/dL (ref 3.5–5.2)
ALK PHOS: 55 U/L (ref 39–117)
ALT: 18 U/L (ref 0–35)
AST: 23 U/L (ref 0–37)
BUN: 22 mg/dL (ref 6–23)
CALCIUM: 9.1 mg/dL (ref 8.4–10.5)
CO2: 29 mEq/L (ref 19–32)
CREATININE: 0.85 mg/dL (ref 0.40–1.20)
Chloride: 105 mEq/L (ref 96–112)
GFR: 67.05 mL/min (ref >60.00)
Glucose, Bld: 177 mg/dL — ABNORMAL HIGH (ref 70–99)
POTASSIUM: 4.7 meq/L (ref 3.5–5.1)
Sodium: 138 mEq/L (ref 135–145)
TOTAL PROTEIN: 7.1 g/dL (ref 6.0–8.3)
Total Bilirubin: 0.3 mg/dL (ref 0.2–1.2)

## 2017-10-29 LAB — CBC
HEMATOCRIT: 39.4 % (ref 36.0–46.0)
Hemoglobin: 13.1 g/dL (ref 12.0–15.0)
MCHC: 33.2 g/dL (ref 30.0–36.0)
MCV: 99.1 fl (ref 78.0–100.0)
PLATELETS: 139 10*3/uL — AB (ref 150.0–400.0)
RBC: 3.98 Mil/uL (ref 3.87–5.11)
RDW: 15.9 % — ABNORMAL HIGH (ref 11.5–15.5)
WBC: 6.3 10*3/uL (ref 4.0–10.5)

## 2017-11-01 LAB — PAIN MGMT, PROFILE 8 W/CONF, U
6 Acetylmorphine: NEGATIVE ng/mL (ref <10)
ALCOHOL METABOLITES: NEGATIVE ng/mL (ref <500)
ALPHAHYDROXYALPRAZOLAM: NEGATIVE ng/mL (ref <25)
AMINOCLONAZEPAM: NEGATIVE ng/mL (ref <25)
Alphahydroxymidazolam: NEGATIVE ng/mL (ref <50)
Alphahydroxytriazolam: NEGATIVE ng/mL (ref <50)
Amphetamines: NEGATIVE ng/mL (ref <500)
BENZODIAZEPINES: POSITIVE ng/mL — AB (ref <100)
BUPRENORPHINE, URINE: NEGATIVE ng/mL (ref <5)
COCAINE METABOLITE: NEGATIVE ng/mL (ref <150)
CODEINE: NEGATIVE ng/mL (ref <50)
Creatinine: 33.2 mg/dL
HYDROCODONE: NEGATIVE ng/mL (ref <50)
HYDROMORPHONE: 238 ng/mL — AB (ref <50)
Hydroxyethylflurazepam: NEGATIVE ng/mL (ref <50)
Lorazepam: 520 ng/mL — ABNORMAL HIGH (ref <50)
MDMA: NEGATIVE ng/mL (ref <500)
Marijuana Metabolite: NEGATIVE ng/mL (ref <20)
NORDIAZEPAM: NEGATIVE ng/mL (ref <50)
Norhydrocodone: NEGATIVE ng/mL (ref <50)
OPIATES: POSITIVE ng/mL — AB (ref <100)
Oxazepam: NEGATIVE ng/mL (ref <50)
Oxidant: NEGATIVE ug/mL (ref <200)
Oxycodone: NEGATIVE ng/mL (ref <100)
TEMAZEPAM: NEGATIVE ng/mL (ref <50)
pH: 6.37 (ref 4.5–9.0)

## 2017-11-04 ENCOUNTER — Other Ambulatory Visit: Payer: Self-pay | Admitting: Internal Medicine

## 2017-11-04 NOTE — Telephone Encounter (Signed)
Lorazepam 10-08-17 #30 Morphine last filled 09-28-17 #150  Last OV 10-28-17 No Future OV Last UDS/CSA 10-28-17

## 2017-11-06 ENCOUNTER — Encounter

## 2017-11-06 ENCOUNTER — Ambulatory Visit: Payer: PPO | Admitting: Internal Medicine

## 2017-11-06 ENCOUNTER — Encounter: Payer: Self-pay | Admitting: Internal Medicine

## 2017-11-06 VITALS — BP 156/78 | HR 67 | Ht 66.0 in | Wt 88.0 lb

## 2017-11-06 DIAGNOSIS — E46 Unspecified protein-calorie malnutrition: Secondary | ICD-10-CM | POA: Diagnosis not present

## 2017-11-06 DIAGNOSIS — I739 Peripheral vascular disease, unspecified: Secondary | ICD-10-CM

## 2017-11-06 DIAGNOSIS — S81801A Unspecified open wound, right lower leg, initial encounter: Secondary | ICD-10-CM | POA: Diagnosis not present

## 2017-11-06 DIAGNOSIS — Z131 Encounter for screening for diabetes mellitus: Secondary | ICD-10-CM | POA: Diagnosis not present

## 2017-11-06 DIAGNOSIS — I10 Essential (primary) hypertension: Secondary | ICD-10-CM | POA: Diagnosis not present

## 2017-11-06 NOTE — Progress Notes (Signed)
New Outpatient Visit Date: 11/06/2017  Referring Provider: Max T. Milinda Pointer, [facility] [address]. [place], [place] [address]  Chief Complaint: Right ankle wound  HPI:  Ms. Halberg is a 84 y.o. female who is being seen today for the evaluation of right ankle wound and suspected peripheral vascular disease at the request of [place]. She has a history of hypertension, hyperlipidemia, shingles, stroke, and GERD.  Ms. Yager reports having a sore overlying the right lateral ankle for more than a year.  There has been intermittent healing during that time, though it never completely resolved.  Today, it is the best that it has looked since the wound first began.  She has been following closely with Dr. Milinda Pointer, who referred Ms. [name] for ABI's that returned abnormal (see details below).  Ms. Storck notes frequent itching and burning in her feet and legs.  In fact, she reports chronic itching throughout her body and has seen a dermatologist in the past.  She does not report pain in either leg with walking or other activities.  She has never undergone vascular imaging, other than the aforementioned ABI's.  She also complains of numbness in her toes, as well as intermittent bluish discoloration of her feet.  Ms. Schimpf notes intermittent shortness of breath, most often at night or when sitting still.  Shortness of breath also occurs when she bends over.  She does not have exertional dyspnea.  She also denies chest pain.  She notes rare skipped beats or pauses without accompanying symptoms.  She reports occasional swelling of her feet but otherwise no lower extremity edema.  --------------------------------------------------------------------------------------------------  Cardiovascular History & Procedures: Cardiovascular Problems:  Right ankle wound with abnormal ABI's  Risk Factors:  Hypertension, hyperlipidemia, and age > 96  Cath/PCI:  None  CV Surgery:  None  EP  Procedures and Devices:  None  Non-Invasive Evaluation(s):  ABI's (09/04/17): Ankle vessels non-compressible.  TBI's: 0.5 on the right and 0.69 on the left.  Recent CV Pertinent Labs: Lab Results  Component Value Date   CHOL 142 02/22/2014   HDL 64.30 02/22/2014   LDLCALC 50 02/22/2014   TRIG 140.0 02/22/2014   CHOLHDL 2 02/22/2014   K 4.7 10/28/2017   K 3.8 01/22/2013   MG 1.8 05/05/2011   BUN 22 10/28/2017   BUN 12 01/19/2013   CREATININE 0.85 10/28/2017   CREATININE 0.88 01/19/2013    --------------------------------------------------------------------------------------------------  Past Medical History:  Diagnosis Date  . Arrhythmia   . Arthritis   . Atypical chest pain    Lexiscan myoview (5/11) with EF 84%, normal wall motion, small fixed apical  perfusion defect likely due to breast attenuation, no evidence for ischemia or infarction.  **Patient had an  . Breast cancer ([place])    Bilateral mastectomies 1986.  [name] CVA (cerebral infarction) 10/12   right lacunar  . Depression   . Dyspnea    Echo (5/11) was a difficult study due to breast implants but showed normal LV and RV size and systolic function.      [name] GERD (gastroesophageal reflux disease)   . Headache    s/p shingles - right side of head  . History of partial thyroidectomy   . Hyperlipidemia   . Hypertension    in past - no current meds/issues  . Obstruction of intestine ([place])   . Partial small bowel obstruction ([place]) 7/14   no surgery  . Wears dentures    full upper  . Zoster  with Post-herpetic neuralgia    Past Surgical History:  Procedure Laterality Date  . ABDOMINAL HYSTERECTOMY    . BREAST SURGERY Bilateral    cancer - mastectomy and implant insertion  . MASTECTOMY  1980   bilateral  . NECK SURGERY    . TARSORRHAPHY Bilateral 08/16/2015   Procedure: MINOR TARSORRAPHY LATERAL PLACEMENT;  Surgeon: Karle Starch, MD;  Location: [facility];  Service: Ophthalmology;  Laterality:  Bilateral;  . VAGINAL DELIVERY      Current Meds  Medication Sig  . acyclovir (ZOVIRAX) 400 MG tablet Take 1 tablet (400 mg total) by mouth 4 (four) times daily.  [name] aspirin 81 MG tablet Take 81 mg by mouth daily.    [name] azelastine (ASTELIN) 0.1 % nasal spray Place into both nostrils 2 (two) times daily. Use in each nostril as directed  . desonide (DESOWEN) 0.05 % ointment   . fluticasone (FLONASE) 50 MCG/ACT nasal spray Place 2 sprays into both nostrils daily.  [name] LORazepam (ATIVAN) 1 MG tablet TAKE 1/2 TO 1 TABLET BY MOUTH EVERY NIGHT AT BEDTIME  . morphine (MSIR) 15 MG tablet TAKE 1 TABLET BY MOUTH EVERY 3 HOURS AS NEEDED FOR PAIN  . nystatin (MYCOSTATIN) 100000 UNIT/ML suspension Take 5 mLs (500,000 Units total) by mouth 4 (four) times daily.  . pantoprazole (PROTONIX) 40 MG tablet Take 1 tablet (40 mg total) by mouth 2 (two) times daily.  . polyethylene glycol (MIRALAX / GLYCOLAX) packet Take 17 g by mouth at bedtime.  . topiramate (TOPAMAX) 100 MG tablet Take 1 tablet (100 mg total) by mouth 2 (two) times daily.    Allergies: Duloxetine; Cymbalta [duloxetine hcl]; Maxitrol [neomycin-polymyxin-dexameth]; Pregabalin; and Tobradex [tobramycin-dexamethasone]  Social History   Tobacco Use  . Smoking status: Never Smoker  . Smokeless tobacco: Never Used  Substance Use Topics  . Alcohol use: No    Alcohol/week: 0.0 oz  . Drug use: No    Family History  Problem Relation Age of Onset  . Heart disease Son   . Heart Problems Son     Review of Systems: Chronic weight loss with dysphagia.  Patient reports being told that she may not survive surgery aimed at treating her dysphagia.  Otherwise, a 12-system review of systems was performed and was negative except as noted in the HPI.  --------------------------------------------------------------------------------------------------  Physical Exam: BP (!) 156/78 (BP Location: Right Arm, Patient Position: Sitting, Cuff Size: Normal)    Pulse 67   Ht 5\' 6"  (1.676 m)   Wt 88 lb (39.9 kg)   BMI 14.20 kg/m   General:  Cachectic elderly woman seated on the exam table.  She is accompanied by her daughter-in-law HEENT: No conjunctival pallor. Right eye remains closed.  Moist mucous membranes. OP clear. Neck: Supple without lymphadenopathy, thyromegaly, JVD, or HJR. Lungs: Normal work of breathing. Clear to auscultation bilaterally without wheezes or crackles. Heart: Regular rate and rhythm without murmurs, rubs, or gallops. Non-displaced PMI. Abd: Bowel sounds present. Scaphoid abdomen.  NT/ND.  No HSM. Ext: Trace ankle edema.  Right DP pulse is 2+; right PT and left DP/PT pulses are trace.  Radial, femoral, and popliteal pulses are 2+ bilaterally. Skin: Warm and dry.  Area of scaling and mild erythema overlying the right lateral malleolus.  No open ulceration or drainage. Psych: Normal mood and affect.  EKG:  NSR with left axis deviation, incomplete LBBB, and non-specific T-wave abnormalities.  Lab Results  Component Value Date   WBC 6.3 10/28/2017  HGB 13.1 10/28/2017   HCT 39.4 10/28/2017   MCV 99.1 10/28/2017   PLT 139.0 (L) 10/28/2017    Lab Results  Component Value Date   NA 138 10/28/2017   K 4.7 10/28/2017   CL 105 10/28/2017   CO2 29 10/28/2017   BUN 22 10/28/2017   CREATININE 0.85 10/28/2017   GLUCOSE 177 (H) 10/28/2017   ALT 18 10/28/2017    Lab Results  Component Value Date   CHOL 142 02/22/2014   HDL 64.30 02/22/2014   LDLCALC 50 02/22/2014   TRIG 140.0 02/22/2014   CHOLHDL 2 02/22/2014     --------------------------------------------------------------------------------------------------  ASSESSMENT AND PLAN: Peripheral vascular disease and right ankle wound Wound began over a year ago and appears relatively well-healed today.  ABI's revealed non-compressible runoff vessels suggestive of significant calcification.  TBI's were mildly abnormal, worse on the right.  Ms. Ohagan has chronic  leg and foot pain that sounds neuropathic.  She does not report claudication.  I suspect that small-vessel disease and malnutrition are the main causes for her poor wound healing.  We have discussed conservative therapy versus further evaluation options, including lower extremity arterial duplex examination, CTA/MRA, and conventional angiography.  I am concerned about the risk for complications with angiography, given Ms. Mccadden's age and weight.  Ms. Long and her daughter-in-law would like to begin with further ultrasound evaluation to evaluate for potential high-grade disease involving large vessels that could be intervened upon, if necessary.  Given that Ms. Silber's wound has almost completely resolved, I favor avoiding invasive procedures.  I will check a hemoglobin A1c today, given neuropathic pain as well as elevated A1c of 6.1 in 2012.  Shortness of breath Long-standing and typically present when resting or bending over.  Mr. Bonilla has minimal leg edema and otherwise appears euvolemic.  EKG shows non-specific changes.  We will defer additional testing at this time but would need to consider echo if symptoms worsen.  Malnutrition Ms. Furber is severely underweight, which may be contributing to poor wound healing.  She should continue to follow up with Dr. Silvio Pate regarding nutritional support.  Hypertension Moderately elevated blood pressure today, though readings have been normal in the past.  No intervention at this time.  We will reassess at follow-up.  Follow-up: Return to clinic in 1 month.  Nelva Bush, MD 11/06/2017 3:17 PM

## 2017-11-06 NOTE — Patient Instructions (Addendum)
Medication Instructions:  Your physician recommends that you continue on your current medications as directed. Please refer to the Current Medication list given to you today.   Labwork: Your physician recommends that you return for lab work in: TODAY to check Hemoglobin A1c.   Testing/Procedures: Your physician has requested that you have a lower extremity arterial duplex- During this test, ultrasound is used to evaluate arterial blood flow in the legs. Allow approximately one hour for this exam.    Follow-Up: Your physician recommends that you schedule a follow-up appointment in: [place].   If you need a refill on your cardiac medications before your next appointment, please call your pharmacy.

## 2017-11-07 DIAGNOSIS — S81801A Unspecified open wound, right lower leg, initial encounter: Secondary | ICD-10-CM | POA: Insufficient documentation

## 2017-11-07 LAB — HEMOGLOBIN A1C
ESTIMATED AVERAGE GLUCOSE: 117 mg/dL
HEMOGLOBIN A1C: 5.7 % — AB (ref 4.8–5.6)

## 2017-11-18 ENCOUNTER — Other Ambulatory Visit: Payer: Self-pay | Admitting: Internal Medicine

## 2017-11-18 DIAGNOSIS — S81801A Unspecified open wound, right lower leg, initial encounter: Secondary | ICD-10-CM

## 2017-11-18 DIAGNOSIS — I739 Peripheral vascular disease, unspecified: Secondary | ICD-10-CM

## 2017-11-25 ENCOUNTER — Ambulatory Visit (INDEPENDENT_AMBULATORY_CARE_PROVIDER_SITE_OTHER): Payer: PPO

## 2017-11-25 DIAGNOSIS — S81801A Unspecified open wound, right lower leg, initial encounter: Secondary | ICD-10-CM | POA: Diagnosis not present

## 2017-11-25 DIAGNOSIS — I739 Peripheral vascular disease, unspecified: Secondary | ICD-10-CM | POA: Diagnosis not present

## 2017-11-26 ENCOUNTER — Telehealth: Payer: Self-pay | Admitting: *Deleted

## 2017-11-26 NOTE — Telephone Encounter (Signed)
Left message on [name] home/work phone to call to schedule pt's appt.

## 2017-11-26 NOTE — Telephone Encounter (Signed)
-----   Message from Garrel Ridgel, [place] sent at 11/26/2017  7:24 AM EDT ----- Vascular studies are OK.  Continue to fu with [place] for ulcer.

## 2017-11-26 NOTE — Telephone Encounter (Signed)
I informed pt of Dr. Stephenie Acres review of results and orders. Pt states she does not drive, so I will have to schedule with [name].

## 2017-11-26 NOTE — Telephone Encounter (Signed)
I informed [name] of Dr. Stephenie Acres recommendation. [name] states it will have to be a Monday in June, that is the only day she has off. Transferred to schedulers.

## 2017-11-29 ENCOUNTER — Other Ambulatory Visit: Payer: Self-pay | Admitting: Internal Medicine

## 2017-12-03 NOTE — Telephone Encounter (Signed)
Both last filled 11-04-17 Last OV 10-28-17 No Future OV  Last UDS/CSA 10-28-17

## 2017-12-04 DIAGNOSIS — M3501 Sicca syndrome with keratoconjunctivitis: Secondary | ICD-10-CM | POA: Diagnosis not present

## 2017-12-05 ENCOUNTER — Ambulatory Visit (INDEPENDENT_AMBULATORY_CARE_PROVIDER_SITE_OTHER): Payer: PPO | Admitting: Internal Medicine

## 2017-12-05 ENCOUNTER — Encounter: Payer: Self-pay | Admitting: Internal Medicine

## 2017-12-05 VITALS — BP 150/62 | HR 69 | Ht 66.0 in | Wt 84.2 lb

## 2017-12-05 DIAGNOSIS — S91001D Unspecified open wound, right ankle, subsequent encounter: Secondary | ICD-10-CM | POA: Diagnosis not present

## 2017-12-05 DIAGNOSIS — R0602 Shortness of breath: Secondary | ICD-10-CM | POA: Diagnosis not present

## 2017-12-05 DIAGNOSIS — I10 Essential (primary) hypertension: Secondary | ICD-10-CM

## 2017-12-05 NOTE — Patient Instructions (Signed)
Medication Instructions:  Your physician recommends that you continue on your current medications as directed. Please refer to the Current Medication list given to you today.   Labwork: none  Testing/Procedures: Your physician has requested that you have an echocardiogram. Echocardiography is a painless test that uses sound waves to create images of your heart. It provides your doctor with information about the size and shape of your heart and how well your heart's chambers and valves are working. This procedure takes approximately one hour. There are no restrictions for this procedure. You may get an IV, if needed, to receive an ultrasound enhancing agent through to better visualize your heart.    Follow-Up: Your physician recommends that you schedule a follow-up appointment in: FOR BLOOD PRESSURE CHECK ON THE SAME DAY AS THE ECHO.   Your physician recommends that you schedule a follow-up appointment in: 2 MONTHS WITH DR END OR APP.  If you need a refill on your cardiac medications before your next appointment, please call your pharmacy.

## 2017-12-05 NOTE — Progress Notes (Signed)
Follow-up Outpatient Visit Date: 12/05/2017  Primary Care Provider: Venia Carbon, MD [place] [address]  Chief Complaint: Follow-up ankle wound  HPI:  [name] is a 84 y.o. year-old female with history of hypertension, hyperlipidemia, shingles, stroke, and GERD, who presents for follow-up of PAD.  Today, [name] reports that her right lateral ankle wound is about the same.  I will sometimes heal over or develop a scab.  The scab will sometimes become dislodged feeling underlying wound.  She is scheduled to follow-up with Dr. Milinda Pointer again next month.  She has not had any pain in her legs.  Today, [name] is most concerned about increased shortness of breath since her last visit.  This now occurs with even mild activity.  She has not had any chest pain but notes pain along the back of her neck, particularly with extension.  This is been present for the last few weeks.  She denies orthopnea, PND, and edema.  She has been trying to eat and drink to improve her nutrition, though this remains difficult.  She does not drink much water, instead drinking coffee, tea, and soda.  --------------------------------------------------------------------------------------------------  Cardiovascular History & Procedures: Cardiovascular Problems:  Right ankle wound with abnormal ABI's  Risk Factors:  Hypertension, hyperlipidemia, and age > 46  Cath/PCI:  None  CV Surgery:  None  EP Procedures and Devices:  None  Non-Invasive Evaluation(s):  Bilateral lower extremity arterial vascular studies (11/25/2017): Noncompressible ankle arteries bilaterally.  Right TBI is abnormal at 0.5.  Doppler studies shows no significant stenosis/occlusion in either leg.  ABI's (09/04/17): Ankle vessels non-compressible.  TBI's: 0.5 on the right and 0.69 on the left.   Recent CV Pertinent Labs: Lab Results  Component Value Date   CHOL 142 02/22/2014   HDL 64.30  02/22/2014   LDLCALC 50 02/22/2014   TRIG 140.0 02/22/2014   CHOLHDL 2 02/22/2014   K 4.7 10/28/2017   K 3.8 01/22/2013   MG 1.8 05/05/2011   BUN 22 10/28/2017   BUN 12 01/19/2013   CREATININE 0.85 10/28/2017   CREATININE 0.88 01/19/2013    Past medical and surgical history were reviewed and updated in EPIC.  Current Meds  Medication Sig  . acyclovir (ZOVIRAX) 400 MG tablet Take 1 tablet (400 mg total) by mouth 4 (four) times daily.  [name] aspirin 81 MG tablet Take 81 mg by mouth daily.    [name] azelastine (ASTELIN) 0.1 % nasal spray Place into both nostrils 2 (two) times daily. Use in each nostril as directed  . desonide (DESOWEN) 0.05 % ointment   . fluticasone (FLONASE) 50 MCG/ACT nasal spray Place 2 sprays into both nostrils daily.  [name] LORazepam (ATIVAN) 1 MG tablet TAKE ONE-HALF (0.5) TO ONE (1) TABLET BYMOUTH AT BEDTIME.  [name] morphine (MSIR) 15 MG tablet TAKE ONE TABLET BY MOUTH EVERY THREE HOURS AS NEEDED FOR PAIN  . nystatin (MYCOSTATIN) 100000 UNIT/ML suspension Take 5 mLs (500,000 Units total) by mouth 4 (four) times daily.  . pantoprazole (PROTONIX) 40 MG tablet Take 1 tablet (40 mg total) by mouth 2 (two) times daily.  . polyethylene glycol (MIRALAX / GLYCOLAX) packet Take 17 g by mouth at bedtime.  . topiramate (TOPAMAX) 100 MG tablet Take 1 tablet (100 mg total) by mouth 2 (two) times daily.    Allergies: Duloxetine; Cymbalta [duloxetine hcl]; Maxitrol [neomycin-polymyxin-dexameth]; Pregabalin; and Tobradex [tobramycin-dexamethasone]  Social History   Tobacco Use  . Smoking status: Former Research scientist (life sciences)  .  Smokeless tobacco: Never Used  Substance Use Topics  . Alcohol use: No    Alcohol/week: 0.0 oz  . Drug use: No    Family History  Problem Relation Age of Onset  . Heart disease Son        open heart surgery for a blockage  . Heart Problems Son   . Diabetes Son   . Parkinson's disease Son     Review of Systems: A 12-system review of systems was performed and was  negative except as noted in the HPI.  --------------------------------------------------------------------------------------------------  Physical Exam: BP (!) 150/62 (BP Location: Right Arm, Patient Position: Sitting, Cuff Size: Normal)   Pulse 69   Ht 5\' 6"  (1.676 m)   Wt 84 lb 4 oz (38.2 kg)   BMI 13.60 kg/m   General: Frail, elderly woman, seated comfortably on the exam table. HEENT: No conjunctival pallor or scleral icterus. Moist mucous membranes.  OP clear. Neck: Supple without lymphadenopathy, thyromegaly, JVD, or HJR. Lungs: Normal work of breathing. Clear to auscultation bilaterally without wheezes or crackles. Heart: Regular rate and rhythm without murmurs, rubs, or gallops. Non-displaced PMI. Abd: Bowel sounds present. Soft, NT/ND without hepatosplenomegaly Ext: No lower extremity edema. Skin: Warm and dry.  Right ankle wound not examined today..   Lab Results  Component Value Date   WBC 6.3 10/28/2017   HGB 13.1 10/28/2017   HCT 39.4 10/28/2017   MCV 99.1 10/28/2017   PLT 139.0 (L) 10/28/2017    Lab Results  Component Value Date   NA 138 10/28/2017   K 4.7 10/28/2017   CL 105 10/28/2017   CO2 29 10/28/2017   BUN 22 10/28/2017   CREATININE 0.85 10/28/2017   GLUCOSE 177 (H) 10/28/2017   ALT 18 10/28/2017    Lab Results  Component Value Date   CHOL 142 02/22/2014   HDL 64.30 02/22/2014   LDLCALC 50 02/22/2014   TRIG 140.0 02/22/2014   CHOLHDL 2 02/22/2014    --------------------------------------------------------------------------------------------------  ASSESSMENT AND PLAN: Right ankle wound Per the patient, this is stable.  Though TBI's are abnormal in the right leg, no significant arterial obstruction was identified in either lower extremity.  This suggest possible microvascular disease that would not be amenable to percutaneous intervention.  Given her age and frailty, I would like to defer arteriography unless her wound continues to be a  problem and no other explanation is identified.  I encouraged [name] to follow-up with Dr. Milinda Pointer.  Shortness of breath This has worsened over the last month.  [name] appears euvolemic on exam.  We have agreed to obtain an echocardiogram for further assessment.  Pretension Blood pressure mildly elevated today.  I will defer ongoing management to Dr. Silvio Pate.  Follow-up: Return to clinic in 2 months.  Nelva Bush, MD 12/05/2017 2:20 PM

## 2017-12-06 ENCOUNTER — Encounter: Payer: Self-pay | Admitting: Internal Medicine

## 2017-12-16 ENCOUNTER — Ambulatory Visit: Payer: PPO | Admitting: Podiatry

## 2017-12-16 ENCOUNTER — Encounter: Payer: Self-pay | Admitting: Podiatry

## 2017-12-16 DIAGNOSIS — L98491 Non-pressure chronic ulcer of skin of other sites limited to breakdown of skin: Secondary | ICD-10-CM | POA: Diagnosis not present

## 2017-12-16 DIAGNOSIS — B351 Tinea unguium: Secondary | ICD-10-CM

## 2017-12-16 DIAGNOSIS — M79676 Pain in unspecified toe(s): Secondary | ICD-10-CM | POA: Diagnosis not present

## 2017-12-16 NOTE — Progress Notes (Signed)
She presents today for follow-up of ulceration to the lateral ankle.  Her daughter states that her toenails are too long and need to be trimmed as well.  She is been to the cardiologist and the vascular doctors and like to know the results.  Objective: Vital signs are stable alert and oriented x3.  Pulses remain nonpalpable she is very thin and skinny poorly nourished.  Superficial ulceration lateral malleolus open today no purulence no malodor only mild erythema surrounding it no cellulitis or toenails are long thick yellow dystrophic-like mycotic painful palpation as well as debridement.  Assessment: Pain in limb secondary to onychomycosis.  Peripheral vascular disease.  Plan: Debrided toenails 1 through 5 bilateral cover service secondary to pain today I also evaluated debrided and covered the ulceration demonstrating to them how to dress the ulceration on a daily basis.  I will follow-up with her in 2 to 3 months.

## 2017-12-27 ENCOUNTER — Ambulatory Visit (INDEPENDENT_AMBULATORY_CARE_PROVIDER_SITE_OTHER): Payer: PPO

## 2017-12-27 ENCOUNTER — Ambulatory Visit (INDEPENDENT_AMBULATORY_CARE_PROVIDER_SITE_OTHER): Payer: PPO | Admitting: *Deleted

## 2017-12-27 ENCOUNTER — Other Ambulatory Visit: Payer: Self-pay

## 2017-12-27 VITALS — BP 150/62 | HR 64 | Ht 66.0 in | Wt 87.5 lb

## 2017-12-27 DIAGNOSIS — I10 Essential (primary) hypertension: Secondary | ICD-10-CM | POA: Diagnosis not present

## 2017-12-27 DIAGNOSIS — R0602 Shortness of breath: Secondary | ICD-10-CM | POA: Diagnosis not present

## 2017-12-27 LAB — ECHOCARDIOGRAM COMPLETE
Height: 66 in
WEIGHTICAEL: 1400 [oz_av]

## 2017-12-27 NOTE — Progress Notes (Signed)
1.) Reason for visit: BP check  2.) Name of MD requesting visit: End  3.) H&P: The patient recently saw Dr. Saunders Revel in clinic on 12/05/17. She has a history of hypertension. Her BP was 150/62 at her last office visit. Dr. Saunders Revel had recommended that patient come back for a repeat BP check today. She is having an echocardiogram done as well for shortness of breath  4.) ROS related to problem: The patient is without complaints today. Current medications confirmed with the patient and her daughter. She did take her medications this morning as prescribed. BP today is 150/62 & HR- 64.   5.) Assessment and plan per MD: The patient is aware I will forward today's reading to Dr. Saunders Revel to review. Per Dr. Darnelle Bos last office note, he would "defer ongoing management" of the patient's blood pressure to Dr. Silvio Pate. The patient was taken to the echo room for her test to be preformed.

## 2017-12-27 NOTE — Patient Instructions (Signed)
Medication Instructions: - Your physician recommends that you continue on your current medications as directed. Please refer to the Current Medication list given to you today.  Labwork: - none ordered  Procedures/Testing: - none ordered  Follow-Up: - as planned  Any Additional Special Instructions Will Be Listed Below (If Applicable).     If you need a refill on your cardiac medications before your next appointment, please call your pharmacy.

## 2017-12-30 ENCOUNTER — Telehealth: Payer: Self-pay | Admitting: Internal Medicine

## 2017-12-30 ENCOUNTER — Other Ambulatory Visit: Payer: Self-pay | Admitting: Internal Medicine

## 2017-12-30 ENCOUNTER — Other Ambulatory Visit: Payer: Self-pay | Admitting: *Deleted

## 2017-12-30 DIAGNOSIS — I10 Essential (primary) hypertension: Secondary | ICD-10-CM

## 2017-12-30 DIAGNOSIS — Z79899 Other long term (current) drug therapy: Secondary | ICD-10-CM

## 2017-12-30 MED ORDER — HYDROCHLOROTHIAZIDE 12.5 MG PO CAPS: 12.5000 mg | ORAL_CAPSULE | Freq: Every day | ORAL | 3 refills | Status: DC

## 2017-12-30 NOTE — Progress Notes (Signed)
BP suboptimally controlled.  I recommend starting HCTZ 12.5 mg daily with BMP and BP check in ~1 month.  Nelva Bush, MD [facility] Pager: [phone]

## 2017-12-30 NOTE — Telephone Encounter (Signed)
Patient daughter calling in regards to ECHO  Has questions regarding results Patient has been scheduled for lab and BP check Please call to discuss

## 2017-12-30 NOTE — Telephone Encounter (Signed)
Patient needs BP check and BMET for in 1 month, please reschedule. Routing to scheduling. Thanks!

## 2017-12-30 NOTE — Telephone Encounter (Signed)
No answer. Left message to call back.   

## 2018-01-01 NOTE — Telephone Encounter (Signed)
Lmov for patient to call and reschedule BMET and BP

## 2018-01-02 NOTE — Telephone Encounter (Signed)
Pt has been rescheduled for 01/27/17  Nothing else needed

## 2018-01-08 ENCOUNTER — Other Ambulatory Visit: Payer: Self-pay | Admitting: Internal Medicine

## 2018-01-08 NOTE — Telephone Encounter (Signed)
Last filled 12-03-17 #150 Last OV 10-28-17 No Future OV Last UDS/CSA 10-28-17  [place]  Rx was changed recently to 4 tablets 4 times a day?

## 2018-01-08 NOTE — Telephone Encounter (Signed)
Last filled 12-03-17 #30 Last filled 12-03-17 Last OV 10-28-17 No Future [place]

## 2018-01-13 ENCOUNTER — Ambulatory Visit: Payer: PPO

## 2018-01-13 ENCOUNTER — Other Ambulatory Visit: Payer: PPO

## 2018-01-15 DIAGNOSIS — M3501 Sicca syndrome with keratoconjunctivitis: Secondary | ICD-10-CM | POA: Diagnosis not present

## 2018-01-27 ENCOUNTER — Ambulatory Visit (INDEPENDENT_AMBULATORY_CARE_PROVIDER_SITE_OTHER): Payer: PPO | Admitting: *Deleted

## 2018-01-27 ENCOUNTER — Ambulatory Visit: Payer: PPO

## 2018-01-27 ENCOUNTER — Other Ambulatory Visit (INDEPENDENT_AMBULATORY_CARE_PROVIDER_SITE_OTHER): Payer: PPO

## 2018-01-27 VITALS — BP 136/58 | HR 76 | Ht 66.0 in | Wt 83.5 lb

## 2018-01-27 DIAGNOSIS — Z79899 Other long term (current) drug therapy: Secondary | ICD-10-CM

## 2018-01-27 DIAGNOSIS — I10 Essential (primary) hypertension: Secondary | ICD-10-CM

## 2018-01-27 NOTE — Patient Instructions (Addendum)
Medication Instructions: - Your physician recommends that you continue on your current medications as directed. Please refer to the Current Medication list given to you today.  Labwork: - BMP today  Procedures/Testing: - none ordered  Follow-Up: - as scheduled 02/10/18 with Christell Faith, PA   Any Additional Special Instructions Will Be Listed Below (If Applicable).     If you need a refill on your cardiac medications before your next appointment, please call your pharmacy.

## 2018-01-27 NOTE — Progress Notes (Signed)
1.) Reason for visit: BP check & lab  2.) Name of MD requesting visit: End  3.) H&P: The patient was seen by Dr. Saunders Revel on 12/05/17. She has a history of hypertension. Her BP was mildly elevated at that office visit (150/62). She was instructed to come back to the office on for a repeat BP check on 12/27/17. At that time her BP was 150/62. Dr. Saunders Revel started her on HCTZ 12.5 mg once daily and advised her to come back today for a repeat BP check & BMP.   4.) ROS related to problem: Medications were reviewed with the patient today. She did take all of her prescribed medications today prior to her BP check. She complains of intermittent dizziness/ lightheadedness/ & SOB that is not new for her. Her BP today is 136/58. HR- 76.   5.) Assessment and plan per MD: The patient is aware that I will forward her readings to Dr. Saunders Revel to review. She is scheduled to follow up with Christell Faith, PA on 02/10/18. The patient is concerned about coming back for that appointment as she states " I can't keep making all these trips up here. It's hard for me to find a ride." She is aware that I will voice her concern to Dr. Saunders Revel and we will call her back with any further recommendations. She is agreeable.

## 2018-01-28 LAB — BASIC METABOLIC PANEL
BUN/Creatinine Ratio: 26 (ref 12–28)
BUN: 27 mg/dL (ref 8–27)
CO2: 22 mmol/L (ref 20–29)
CREATININE: 1.05 mg/dL — AB (ref 0.57–1.00)
Calcium: 9 mg/dL (ref 8.7–10.3)
Chloride: 101 mmol/L (ref 96–106)
GFR calc Af Amer: 55 mL/min/{1.73_m2} — ABNORMAL LOW (ref >59)
GFR calc non Af Amer: 48 mL/min/{1.73_m2} — ABNORMAL LOW (ref >59)
GLUCOSE: 104 mg/dL — AB (ref 65–99)
Potassium: 4.4 mmol/L (ref 3.5–5.2)
SODIUM: 141 mmol/L (ref 134–144)

## 2018-01-29 NOTE — Progress Notes (Signed)
Please let [name] know that her blood pressure is under better control.  Her creatinine increased slightly with addition of HCTZ.  I think it is okay to continue the medication, though I encouraged her to increase her water intake.  We should repeat a BMP in about 2 weeks to make sure that her renal function is stable to improved.  Nelva Bush, MD [facility] Pager: [phone]

## 2018-01-30 ENCOUNTER — Other Ambulatory Visit: Payer: Self-pay | Admitting: Internal Medicine

## 2018-01-30 NOTE — Progress Notes (Signed)
I left a message for the patient to call. 

## 2018-01-31 ENCOUNTER — Telehealth: Payer: Self-pay | Admitting: Internal Medicine

## 2018-01-31 DIAGNOSIS — Z79899 Other long term (current) drug therapy: Secondary | ICD-10-CM

## 2018-01-31 DIAGNOSIS — I10 Essential (primary) hypertension: Secondary | ICD-10-CM

## 2018-01-31 NOTE — Telephone Encounter (Signed)
Patient daughter calling for lab results Please return call to her cell at [phone]

## 2018-01-31 NOTE — Telephone Encounter (Signed)
S/w patient's daughter. She verbalized understanding of lab results and recommendations from 01/27/18. SHe will encourage patient to increase intake of water. Daughter says its a struggle to get her to eat and drink but she will let her know the importance of doing this. She [facility] for lab work. BMET entered.

## 2018-02-07 ENCOUNTER — Other Ambulatory Visit: Payer: Self-pay | Admitting: Internal Medicine

## 2018-02-07 NOTE — Telephone Encounter (Signed)
Last filled 01-08-18 #30 Last OV 10-28-17 No Future OV

## 2018-02-09 NOTE — Progress Notes (Deleted)
Cardiology Office Note Date:  02/09/2018  Patient ID:  [name], [name] 1928-06-20, MRN [id number] PCP:  Venia Carbon, MD  Cardiologist:  Dr. Saunders Revel, MD  ***refresh   Chief Complaint: Follow up  History of Present Illness: [name] is a 84 y.o. female with history of PAD, HTN, HLD, stroke, shingles, and GERD who presents for follow up of SOB.  Bilateral lower extremity arterial vascular studies from 11/2017 showed non-compressible ankle arteries bilaterally. Right TBI was abnormal at 0.5. Doppler studies showed no significant stenosis/occlusion in either leg. Prior ABI's from 08/2017 showed ankle vessel were non-compressible with TBI's of 0.5 on the right and 0.69 on the left. 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 She has had a right lateral ankle wound that is followed by Dr. Milinda Pointer. She was last seen in the office on 12/05/2017 for routine follow up and noted some increased SOB that was occurring even with mild activity. There was no chest pain, though she did note some pain along the back of her neck with exertion. BP was noted to be elevated at 150/62. She underwent echo on 12/27/2017 that showed an EF of 60-65%, unable to exclude RWMA, Gr1DD, mild MR, RVSF normal, unable to estimate PASP, dilated IVC consistent with elevated CVP. She was started on HCTZ 12.5 mg daily. Follow up RN visit on 7/22 showed improving BP of 136/58. SCr following initiation of HCTZ noted to be slightly higher than her baseline of 0.8 at 1.05. She was advised to increase her water intake.   ***  Past Medical History:  Diagnosis Date  . Arrhythmia   . Arthritis   . Atypical chest pain    Lexiscan myoview (5/11) with EF 84%, normal wall motion, small fixed apical  perfusion defect likely due to breast attenuation, no evidence for ischemia or infarction.  **Patient had an  . Breast cancer ([place])    Bilateral mastectomies 1986.  [name] CVA (cerebral infarction) 10/12   right lacunar  . Depression   . Dyspnea    Echo (5/11) was a difficult study due to breast implants but showed normal LV and RV size and systolic function.      [name] GERD (gastroesophageal reflux disease)   . Headache    s/p shingles - right side of head  . History of partial thyroidectomy   . Hyperlipidemia   . Hypertension    in past - no current meds/issues  . Obstruction of intestine ([place])   . Partial small bowel obstruction ([place]) 7/14   no surgery  . Wears dentures    full upper  . Zoster     with Post-herpetic neuralgia    Past Surgical History:  Procedure Laterality Date  . ABDOMINAL HYSTERECTOMY    . BREAST SURGERY Bilateral    cancer - mastectomy and implant insertion  . MASTECTOMY  1980   bilateral  . NECK SURGERY    . TARSORRHAPHY Bilateral 08/16/2015   Procedure: MINOR TARSORRAPHY LATERAL PLACEMENT;  Surgeon: Karle Starch, MD;  Location: [facility];  Service: Ophthalmology;  Laterality: Bilateral;  . VAGINAL DELIVERY      No outpatient medications have been marked as taking for the 02/10/18 encounter (Appointment) with Rise Mu, PA-C.    Allergies:   Duloxetine; Cymbalta [duloxetine hcl]; Maxitrol [neomycin-polymyxin-dexameth]; Pregabalin; and Tobradex [tobramycin-dexamethasone]   Social History:  The patient  reports that she has quit smoking. She has never used smokeless tobacco. She  reports that she does not drink alcohol or use drugs.   Family History:  The patient's family history includes Diabetes in her son; Heart Problems in her son; Heart disease in her son; Parkinson's disease in her son.  ROS:   ROS   PHYSICAL EXAM: *** VS:  There were no vitals taken for this visit. BMI: There is no height or weight on file to calculate BMI.  Physical Exam   EKG:  Was ordered and interpreted by me today. Shows ***  Recent Labs: 10/28/2017: ALT 18; Hemoglobin 13.1; Platelets 139.0 01/27/2018: BUN 27; Creatinine, Ser 1.05; Potassium 4.4; Sodium 141  No  results found for requested labs within last 8760 hours.   Estimated Creatinine Clearance: 22.2 mL/min (A) (by C-G formula based on SCr of 1.05 mg/dL (H)).   Wt Readings from Last 3 Encounters:  01/27/18 83 lb 8 oz (37.9 kg)  12/27/17 87 lb 8 oz (39.7 kg)  12/05/17 84 lb 4 oz (38.2 kg)     Other studies reviewed: Additional studies/records reviewed today include: summarized above  ASSESSMENT AND PLAN:  1. ***  Disposition: F/u with Dr. Saunders Revel or an APP in ***   Current medicines are reviewed at length with the patient today.  The patient did not have any concerns regarding medicines.  Signed, Christell Faith, PA-C 02/09/2018 12:16 PM     [address] [place], [place] [address] [phone]

## 2018-02-10 ENCOUNTER — Ambulatory Visit: Payer: PPO | Admitting: Physician Assistant

## 2018-02-10 ENCOUNTER — Other Ambulatory Visit
Admission: RE | Admit: 2018-02-10 | Discharge: 2018-02-10 | Disposition: A | Discharge status: Home / Self Care | Payer: PPO | Source: Ambulatory Visit | Attending: Internal Medicine | Admitting: Internal Medicine

## 2018-02-10 DIAGNOSIS — I10 Essential (primary) hypertension: Secondary | ICD-10-CM | POA: Diagnosis not present

## 2018-02-10 DIAGNOSIS — Z79899 Other long term (current) drug therapy: Secondary | ICD-10-CM | POA: Diagnosis not present

## 2018-02-10 LAB — BASIC METABOLIC PANEL
ANION GAP: 10 (ref 5–15)
BUN: 35 mg/dL — ABNORMAL HIGH (ref 8–23)
CO2: 27 mmol/L (ref 22–32)
Calcium: 8.9 mg/dL (ref 8.9–10.3)
Chloride: 100 mmol/L (ref 98–111)
Creatinine, Ser: 1.1 mg/dL — ABNORMAL HIGH (ref 0.44–1.00)
GFR calc Af Amer: 50 mL/min — ABNORMAL LOW (ref >60)
GFR calc non Af Amer: 44 mL/min — ABNORMAL LOW (ref >60)
GLUCOSE: 162 mg/dL — AB (ref 70–99)
POTASSIUM: 3.8 mmol/L (ref 3.5–5.1)
Sodium: 137 mmol/L (ref 135–145)

## 2018-02-11 ENCOUNTER — Encounter: Payer: Self-pay | Admitting: Physician Assistant

## 2018-02-12 NOTE — Progress Notes (Signed)
Anderson Malta, RN spoke with the patient 01/31/18.

## 2018-02-13 ENCOUNTER — Other Ambulatory Visit: Payer: Self-pay | Admitting: *Deleted

## 2018-02-17 ENCOUNTER — Other Ambulatory Visit: Payer: Self-pay | Admitting: Internal Medicine

## 2018-02-17 NOTE — Telephone Encounter (Signed)
Name of Medication: Morphine Name of Pharmacy: [place] or Written Date and Quantity: 01-10-18 #150 Last Office Visit and Type: 10-28-17 3 Mo F/U Next Office Visit and Type: No OV Scheduled Last Controlled Substance Agreement Date: 10-28-17  Last UDS: 10-28-17

## 2018-02-24 ENCOUNTER — Other Ambulatory Visit: Payer: Self-pay | Admitting: Internal Medicine

## 2018-02-26 DIAGNOSIS — M3501 Sicca syndrome with keratoconjunctivitis: Secondary | ICD-10-CM | POA: Diagnosis not present

## 2018-03-03 NOTE — Telephone Encounter (Signed)
Daughter calling States that she was told to just go to have lab work done at [facility] and was under the impression the follow up appointment was not necessary Patient received a notice on a $50 no show fee but will not be paying Glass blower/designer made aware

## 2018-03-05 ENCOUNTER — Telehealth: Payer: Self-pay | Admitting: Internal Medicine

## 2018-03-05 NOTE — Telephone Encounter (Signed)
I spoke to patient's daughter-in-law and she scheduled appointment on 04/17/18.

## 2018-03-05 NOTE — Telephone Encounter (Signed)
CSRS completed and clear of any other providers than here at [facility] in place of Dr Silvio Pate. Report placed in Dr Alla German Inbox to initial.  Forwarding to [place] to schedule appointment

## 2018-03-05 NOTE — Telephone Encounter (Signed)
Please run her CSRS report for viewing and set up appt at their earliest convenience

## 2018-03-05 NOTE — Telephone Encounter (Signed)
Last filled 02-07-18 #30 Last OV 10-28-17 No Future OV (Past due for 3 mo Narcotic F/U)

## 2018-03-24 ENCOUNTER — Ambulatory Visit: Payer: PPO | Admitting: Podiatry

## 2018-03-24 ENCOUNTER — Encounter: Payer: Self-pay | Admitting: Podiatry

## 2018-03-24 DIAGNOSIS — L98491 Non-pressure chronic ulcer of skin of other sites limited to breakdown of skin: Secondary | ICD-10-CM | POA: Diagnosis not present

## 2018-03-24 NOTE — Progress Notes (Signed)
She presents today for follow-up of ulceration to the right lateral ankle.  States that he seems to be doing pretty good.  States that she may have gained about 3 pounds.  Objective: Vital signs are stable she is alert and oriented x3.  Ulceration to the right ankle is closed with a area of mild erythema overlying it is not warm to the touch but tender to the touch.  Her toenails have been a pedicure did painted.  Her feet and skin looks very healthy this is the best that I have seen it looked since have been treating her.  She does have areas of mild erythematous patches with scaly skin over top of it does not appear to be psoriasis however I am concerned about squamous carcinoma.  Assessment: Well-healing ulceration lateral ankle right.  Plan: Recommend she follow-up with dermatology for lesions on the legs.

## 2018-03-31 ENCOUNTER — Other Ambulatory Visit: Payer: Self-pay | Admitting: Internal Medicine

## 2018-03-31 DIAGNOSIS — H16231 Neurotrophic keratoconjunctivitis, right eye: Secondary | ICD-10-CM | POA: Diagnosis not present

## 2018-03-31 NOTE — Telephone Encounter (Signed)
Copied from [place] [id number]. Topic: Quick Communication - Rx Refill/Question >> Mar 31, 2018  8:29 AM Mylinda Latina, NT wrote: Medication: nystatin (MYCOSTATIN) 100000 UNIT/ML suspension ,not on current med list   Has the patient contacted their pharmacy? No. (Agent: If no, request that the patient contact the pharmacy for the refill.) (Agent: If yes, when and what did the pharmacy advise?)  Preferred Pharmacy (with phone number or street name): [facility] [place] [facility] [place] [phone] (Phone) [phone] (Fax)    Agent: Please be advised that RX refills may take up to 3 business days. We ask that you follow-up with your pharmacy.

## 2018-03-31 NOTE — Telephone Encounter (Signed)
Left message to call office to let us know what the issue is she is having since this is no longer on her med list.

## 2018-03-31 NOTE — Telephone Encounter (Signed)
Nystatin refill Last Refill:start date 08/30/17  # 100 ml 1 RF end date 12/27/17 Last OV: 08/30/17 PCP: Dr Silvio Pate Pharmacy:[facility]  Historical med and not on med list

## 2018-04-01 ENCOUNTER — Ambulatory Visit: Payer: Self-pay

## 2018-04-01 ENCOUNTER — Other Ambulatory Visit: Payer: Self-pay | Admitting: Internal Medicine

## 2018-04-01 NOTE — Telephone Encounter (Signed)
Last filled 03-05-18 #30 Last OV 10-28-17 Next OV with Dr Silvio Pate 04-17-18

## 2018-04-01 NOTE — Telephone Encounter (Signed)
Name of Medication: Morphine Name of Pharmacy: [facility] or Written Date and Quantity: 02-17-18 #150  Last Office Visit and Type: 3 Month F/U 10-28-17 Next Office Visit and Type: Narcotic F/U 04-17-18 Last Controlled Substance Agreement Date: 10-28-17 Last UDS: 10-28-17

## 2018-04-01 NOTE — Telephone Encounter (Signed)
Pt daughter called stating that her mother Ms [name] has a white film covering her tongue and inner mouth. Daughter states that she has dried brushing and mouth rinse but no response. Pt states that it gives her a choking feeling in the morning. Daughter states that this looks like thrush which her mother has had in the past. Appointment scheduled per protocol. Care advice given. Daughter verbalized understanding of all. Reason for Disposition . White patches that stick to tongue or inner cheek  Answer Assessment - Initial Assessment Questions 1. SYMPTOM: "What's the main symptom you're concerned about?" (e.g., dry mouth. chapped lips, lump)     White film on tongue 2. ONSET: "When did the  White film  start?"     Last week 3. PAIN: "Is there any pain?" If so, ask: "How bad is it?" (Scale: 1-10; mild, moderate, severe)     no 4. CAUSE: "What do you think is causing the symptoms?"     thrush 5. OTHER SYMPTOMS: "Do you have any other symptoms?" (e.g., fever, sore throat, toothache, swelling)     No choking feeling in throat 6. PREGNANCY: "Is there any chance you are pregnant?" "When was your last menstrual period?"     N/A  Protocols used: MOUTH [facility]

## 2018-04-02 ENCOUNTER — Ambulatory Visit (INDEPENDENT_AMBULATORY_CARE_PROVIDER_SITE_OTHER): Payer: PPO | Admitting: Primary Care

## 2018-04-02 ENCOUNTER — Encounter: Payer: Self-pay | Admitting: Primary Care

## 2018-04-02 VITALS — BP 136/66 | HR 73 | Temp 98.0°F | Ht 66.0 in | Wt 85.5 lb

## 2018-04-02 DIAGNOSIS — Z23 Encounter for immunization: Secondary | ICD-10-CM

## 2018-04-02 DIAGNOSIS — B370 Candidal stomatitis: Secondary | ICD-10-CM

## 2018-04-02 MED ORDER — FLUCONAZOLE 100 MG PO TABS: ORAL_TABLET | ORAL | 0 refills | Status: DC

## 2018-04-02 NOTE — Assessment & Plan Note (Signed)
No improvement with numerous attempts at liquid Nystatin.  No obvious candida noted on exam, however, her daughter in law confirmed recurrent candida to the oral cavity.  Given no resolve with oral Nystatin, will treat with systemic fluconazole for a 10 day course. She will update with her PCP if no improvement.   Discussed to increase water consumption, limit sugary drinks including Pepsi and milk.

## 2018-04-02 NOTE — Progress Notes (Signed)
Subjective:    Patient ID: [name], female    DOB: 1928/03/31, 84 y.o.   MRN: [id number]  HPI  [name] is an 84 year old female with a history of GERD, allergic rhinitis, oral candida who presents today with a chief complaint of oral candida and dryness.   She was last evaluated for this in February 2019 by Dr. Diona Downs. She reported dry mouth and dry tongue with discharge. She'd tried mucinex DM, allergy pills with little improvement. She was treated oral Nystatin.  She is here today reporting that she'll wake up every morning with her mouth being coated in white stuff, she also feels it to the back of her throat. She'll sometimes feel as though she'll choke, sometimes is able to cough this up. The Nystatin helped temporarily without resolve, but each time she stopped the oral coating returned. Her daughter in law is with her today who confirms the daily white coating.  She saw her dentist one month ago who recommended liquid biotin, this has helped some with dry mouth. She's not using inhaled or oral corticosteroids. She is compliant to loratadine daily, otherwise she's not taken anything OTC for symptoms.   She's drinking one small bottle of water daily, also drinks from a large container of water throughout the day but does not finish. She  also drinks boost, small carton of milk, Pepsi.   She denies fevers, cough, changes in overall health.   Review of Systems  Constitutional: Negative for fever.  HENT: Negative for congestion, postnasal drip and sore throat.        Oral candida   Respiratory: Negative for shortness of breath and wheezing.        Past Medical History:  Diagnosis Date  . Arrhythmia   . Arthritis   . Atypical chest pain    Lexiscan myoview (5/11) with EF 84%, normal wall motion, small fixed apical  perfusion defect likely due to breast attenuation, no evidence for ischemia or infarction.  **Patient had an  . Breast cancer ([place])    Bilateral mastectomies  1986.  [name] CVA (cerebral infarction) 10/12   right lacunar  . Depression   . Dyspnea    Echo (5/11) was a difficult study due to breast implants but showed normal LV and RV size and systolic function.      [name] GERD (gastroesophageal reflux disease)   . Headache    s/p shingles - right side of head  . History of partial thyroidectomy   . Hyperlipidemia   . Hypertension    in past - no current meds/issues  . Obstruction of intestine ([place])   . Partial small bowel obstruction ([place]) 7/14   no surgery  . Wears dentures    full upper  . Zoster     with Post-herpetic neuralgia     Social History   Socioeconomic History  . Marital status: Widowed    Spouse name: Not on file  . Number of children: 2  . Years of education: Not on file  . Highest education level: Not on file  Occupational History  . Occupation: retired Museum/gallery exhibitions officer: RETIRED  Social Needs  . Financial resource strain: Not on file  . Food insecurity:    Worry: Not on file    Inability: Not on file  . Transportation needs:    Medical: Not on file    Non-medical: Not on file  Tobacco Use  . Smoking status: Former Research scientist (life sciences)  .  Smokeless tobacco: Never Used  Substance and Sexual Activity  . Alcohol use: No    Alcohol/week: 0.0 standard drinks  . Drug use: No  . Sexual activity: Not on file  Lifestyle  . Physical activity:    Days per week: Not on file    Minutes per session: Not on file  . Stress: Not on file  Relationships  . Social connections:    Talks on phone: Not on file    Gets together: Not on file    Attends religious service: Not on file    Active member of club or organization: Not on file    Attends meetings of clubs or organizations: Not on file    Relationship status: Not on file  . Intimate partner violence:    Fear of current or ex partner: Not on file    Emotionally abused: Not on file    Physically abused: Not on file    Forced sexual activity: Not on file  Other Topics Concern    . Not on file  Social History Narrative   No living will   No health care POA but requests daughter-in-law [name] to do this   Would like attempts at resuscitation   No feeding tube if cognitively unaware    Past Surgical History:  Procedure Laterality Date  . ABDOMINAL HYSTERECTOMY    . BREAST SURGERY Bilateral    cancer - mastectomy and implant insertion  . MASTECTOMY  1980   bilateral  . NECK SURGERY    . TARSORRHAPHY Bilateral 08/16/2015   Procedure: MINOR TARSORRAPHY LATERAL PLACEMENT;  Surgeon: Elizabeth Starch, MD;  Location: [facility];  Service: Ophthalmology;  Laterality: Bilateral;  . VAGINAL DELIVERY      Family History  Problem Relation Age of Onset  . Heart disease Son        open heart surgery for a blockage  . Heart Problems Son   . Diabetes Son   . Parkinson's disease Son     Allergies  Allergen Reactions  . Duloxetine     REACTION: N/V Mental status change and trouble with balance  . Cymbalta [Duloxetine Hcl]     Unsure of reaction  . Maxitrol [Neomycin-Polymyxin-Dexameth]     Unsure of reaction  . Pregabalin     REACTION: SOB, swollen lips  . Tobradex [Tobramycin-Dexamethasone]     Unsure of reaction    Current Outpatient Medications on File Prior to Visit  Medication Sig Dispense Refill  . acyclovir (ZOVIRAX) 400 MG tablet TAKE 1 TABLET BY MOUTH 4 TIMES DAILY 120 tablet 11  . aspirin 81 MG tablet Take 81 mg by mouth daily.      [name] azelastine (ASTELIN) 0.1 % nasal spray Place into both nostrils 2 (two) times daily. Use in each nostril as directed    . fluticasone (FLONASE) 50 MCG/ACT nasal spray PLACE 2 SPRAYS INTO EACH NOSTRIL ONCE DAILY 16 g 11  . LORATADINE PO Take 10 mg by mouth daily.    [name] LORazepam (ATIVAN) 1 MG tablet TAKE ONE-HALF (0.5 MG) TO ONE (1 MG) TABLET BY MOUTH AT BEDTIME. 30 tablet 0  . morphine (MSIR) 15 MG tablet TAKE 1 TABLET BY MOUTH EVERY 3 HOURS AS NEEDED FOR PAIN 150 tablet 0  . Multiple Vitamin (MULTIVITAMIN) tablet  Take 1 tablet by mouth once daily    . pantoprazole (PROTONIX) 40 MG tablet TAKE 1 TABLET BY MOUTH TWICE (2) DAILY 60 tablet 11  . polyethylene glycol (MIRALAX /  GLYCOLAX) packet Take 17 g by mouth at bedtime.    . RESTASIS 0.05 % ophthalmic emulsion     . topiramate (TOPAMAX) 100 MG tablet TAKE 1 TABLET BY MOUTH TWICE (2) DAILY 60 tablet 11   No current facility-administered medications on file prior to visit.     BP 136/66   Pulse 73   Temp 98 F (36.7 C) (Oral)   Ht 5\' 6"  (1.676 m)   Wt 85 lb 8 oz (38.8 kg)   SpO2 97%   BMI 13.80 kg/m    Objective:   Physical Exam  Constitutional: She appears well-nourished.  HENT:  Mouth/Throat: Oropharynx is clear and moist. No oropharyngeal exudate.  No visible oral candida   Neck: Neck supple.  Cardiovascular: Normal rate and regular rhythm.  Respiratory: Effort normal and breath sounds normal.  Skin: Skin is warm and dry.           Assessment & Plan:

## 2018-04-02 NOTE — Telephone Encounter (Signed)
Pt was seen today by Allie Bossier and placed on medication.

## 2018-04-02 NOTE — Patient Instructions (Signed)
Start fluconazole 100 mg tablets for oral yeast infection. Take 2 tablets by mouth today, then take 1 tablet by mouth once daily for 9 additional days.  Increase consumption of water, try to get 6 glasses daily. Limit Pepsi and milk.  Brush your teeth at least twice daily after meals.   Please schedule an appointment with Dr. Silvio Pate if no improvement or if the infection returns.   It was a pleasure meeting you!

## 2018-04-02 NOTE — Addendum Note (Signed)
Addended by: Jacqualin Combes on: 04/02/2018 01:53 PM   Modules accepted: Orders

## 2018-04-08 DIAGNOSIS — H16231 Neurotrophic keratoconjunctivitis, right eye: Secondary | ICD-10-CM | POA: Diagnosis not present

## 2018-04-17 ENCOUNTER — Ambulatory Visit (INDEPENDENT_AMBULATORY_CARE_PROVIDER_SITE_OTHER): Payer: PPO | Admitting: Internal Medicine

## 2018-04-17 ENCOUNTER — Encounter: Payer: Self-pay | Admitting: Internal Medicine

## 2018-04-17 VITALS — BP 102/60 | HR 80 | Temp 98.4°F | Ht 66.0 in | Wt 84.8 lb

## 2018-04-17 DIAGNOSIS — F1120 Opioid dependence, uncomplicated: Secondary | ICD-10-CM

## 2018-04-17 DIAGNOSIS — R300 Dysuria: Secondary | ICD-10-CM

## 2018-04-17 DIAGNOSIS — N3090 Cystitis, unspecified without hematuria: Secondary | ICD-10-CM | POA: Diagnosis not present

## 2018-04-17 DIAGNOSIS — B0229 Other postherpetic nervous system involvement: Secondary | ICD-10-CM | POA: Diagnosis not present

## 2018-04-17 DIAGNOSIS — F1320 Sedative, hypnotic or anxiolytic dependence, uncomplicated: Secondary | ICD-10-CM | POA: Insufficient documentation

## 2018-04-17 LAB — POC URINALSYSI DIPSTICK (AUTOMATED)
Bilirubin, UA: NEGATIVE
Glucose, UA: NEGATIVE
KETONES UA: NEGATIVE
Leukocytes, UA: NEGATIVE
Nitrite, UA: NEGATIVE
PROTEIN UA: NEGATIVE
RBC UA: NEGATIVE
SPEC GRAV UA: 1.02 (ref 1.010–1.025)
UROBILINOGEN UA: 0.2 U/dL
pH, UA: 6 (ref 5.0–8.0)

## 2018-04-17 MED ORDER — SULFAMETHOXAZOLE-TRIMETHOPRIM 400-80 MG PO TABS: 1.0000 | ORAL_TABLET | Freq: Two times a day (BID) | ORAL | 1 refills | Status: DC

## 2018-04-17 NOTE — Progress Notes (Signed)
Subjective:    Patient ID: [name], female    DOB: Mar 06, 1928, 84 y.o.   MRN: [id number]  HPI Here for follow up of chronic post herpetic pain and narcotic dependence With daughter-in-law as usual  Pain control is about the same Never tolerated long acting agents Uses the morphine prn  Still with "mess on my tongue" and maybe in throat Started on fluconazole by Anda Kraft Clark---didn't really clear it up Mostly in AM--then  Throat stays sore---burning Some trouble swallowing --no real change  Having dysuria again No fever Some increased frequency  Current Outpatient Medications on File Prior to Visit  Medication Sig Dispense Refill  . acyclovir (ZOVIRAX) 400 MG tablet TAKE 1 TABLET BY MOUTH 4 TIMES DAILY 120 tablet 11  . aspirin 81 MG tablet Take 81 mg by mouth daily.      . fluticasone (FLONASE) 50 MCG/ACT nasal spray PLACE 2 SPRAYS INTO EACH NOSTRIL ONCE DAILY 16 g 11  . LORATADINE PO Take 10 mg by mouth daily.    [name] LORazepam (ATIVAN) 1 MG tablet TAKE 1/2 (0.5 MG) TO 1 TALBET BY MOUTH AT BEDTIME 30 tablet 0  . morphine (MSIR) 15 MG tablet TAKE ONE TABLET EVERY 3 HOURS AS NEEDED FOR PAIN 150 tablet 0  . Multiple Vitamin (MULTIVITAMIN) tablet Take 1 tablet by mouth once daily    . pantoprazole (PROTONIX) 40 MG tablet TAKE 1 TABLET BY MOUTH TWICE (2) DAILY 60 tablet 11  . polyethylene glycol (MIRALAX / GLYCOLAX) packet Take 17 g by mouth at bedtime.    . RESTASIS 0.05 % ophthalmic emulsion     . topiramate (TOPAMAX) 100 MG tablet TAKE 1 TABLET BY MOUTH TWICE (2) DAILY 60 tablet 11   No current facility-administered medications on file prior to visit.     Allergies  Allergen Reactions  . Duloxetine     REACTION: N/V Mental status change and trouble with balance  . Cymbalta [Duloxetine Hcl]     Unsure of reaction  . Maxitrol [Neomycin-Polymyxin-Dexameth]     Unsure of reaction  . Pregabalin     REACTION: SOB, swollen lips  . Tobradex [Tobramycin-Dexamethasone]    Unsure of reaction    Past Medical History:  Diagnosis Date  . Arrhythmia   . Arthritis   . Atypical chest pain    Lexiscan myoview (5/11) with EF 84%, normal wall motion, small fixed apical  perfusion defect likely due to breast attenuation, no evidence for ischemia or infarction.  **Patient had an  . Breast cancer ([place])    Bilateral mastectomies 1986.  [name] CVA (cerebral infarction) 10/12   right lacunar  . Depression   . Dyspnea    Echo (5/11) was a difficult study due to breast implants but showed normal LV and RV size and systolic function.      [name] GERD (gastroesophageal reflux disease)   . Headache    s/p shingles - right side of head  . History of partial thyroidectomy   . Hyperlipidemia   . Hypertension    in past - no current meds/issues  . Obstruction of intestine ([place])   . Partial small bowel obstruction ([place]) 7/14   no surgery  . Wears dentures    full upper  . Zoster     with Post-herpetic neuralgia    Past Surgical History:  Procedure Laterality Date  . ABDOMINAL HYSTERECTOMY    . BREAST SURGERY Bilateral    cancer - mastectomy and implant insertion  . MASTECTOMY  1980   bilateral  . NECK SURGERY    . TARSORRHAPHY Bilateral 08/16/2015   Procedure: MINOR TARSORRAPHY LATERAL PLACEMENT;  Surgeon: Karle Starch, MD;  Location: [facility];  Service: Ophthalmology;  Laterality: Bilateral;  . VAGINAL DELIVERY      Family History  Problem Relation Age of Onset  . Heart disease Son        open heart surgery for a blockage  . Heart Problems Son   . Diabetes Son   . Parkinson's disease Son     Social History   Socioeconomic History  . Marital status: Widowed    Spouse name: Not on file  . Number of children: 2  . Years of education: Not on file  . Highest education level: Not on file  Occupational History  . Occupation: retired Museum/gallery exhibitions officer: RETIRED  Social Needs  . Financial resource strain: Not on file  . Food insecurity:     Worry: Not on file    Inability: Not on file  . Transportation needs:    Medical: Not on file    Non-medical: Not on file  Tobacco Use  . Smoking status: Former Research scientist (life sciences)  . Smokeless tobacco: Never Used  Substance and Sexual Activity  . Alcohol use: No    Alcohol/week: 0.0 standard drinks  . Drug use: No  . Sexual activity: Not on file  Lifestyle  . Physical activity:    Days per week: Not on file    Minutes per session: Not on file  . Stress: Not on file  Relationships  . Social connections:    Talks on phone: Not on file    Gets together: Not on file    Attends religious service: Not on file    Active member of club or organization: Not on file    Attends meetings of clubs or organizations: Not on file    Relationship status: Not on file  . Intimate partner violence:    Fear of current or ex partner: Not on file    Emotionally abused: Not on file    Physically abused: Not on file    Forced sexual activity: Not on file  Other Topics Concern  . Not on file  Social History Narrative   No living will   No health care POA but requests daughter-in-law [name] to do this   Would like attempts at resuscitation   No feeding tube if cognitively unaware   Review of Systems Continues on boost and drinks water inbetween No N/V Appetite is about the same    Objective:   Physical Exam  Constitutional: No distress.  HENT:  Mouth/Throat: Oropharynx is clear and moist. No oropharyngeal exudate.  No evidence of thrust  GI: Soft. She exhibits no distension. There is no tenderness. There is no rebound and no guarding.           Assessment & Plan:

## 2018-04-17 NOTE — Assessment & Plan Note (Signed)
Ongoing pain Uses the morphine and lorazepam regularly

## 2018-04-17 NOTE — Assessment & Plan Note (Signed)
Reviewed CSRS--no concerns 

## 2018-04-17 NOTE — Assessment & Plan Note (Signed)
This helps with the low dose morphine to control pain and anxiety

## 2018-04-17 NOTE — Assessment & Plan Note (Signed)
Urinalysis is normal Will try 3 days of low dose bactrim

## 2018-04-30 DIAGNOSIS — M3501 Sicca syndrome with keratoconjunctivitis: Secondary | ICD-10-CM | POA: Diagnosis not present

## 2018-05-05 ENCOUNTER — Other Ambulatory Visit: Payer: Self-pay | Admitting: Internal Medicine

## 2018-05-05 NOTE — Telephone Encounter (Signed)
Last filled 04-02-18 #30 Last OV 04-17-18 Next OV 07-21-18

## 2018-05-09 ENCOUNTER — Other Ambulatory Visit: Payer: Self-pay | Admitting: Internal Medicine

## 2018-05-09 ENCOUNTER — Telehealth: Payer: Self-pay | Admitting: Internal Medicine

## 2018-05-09 MED ORDER — LORAZEPAM 1 MG PO TABS: ORAL_TABLET | ORAL | 0 refills | Status: DC

## 2018-05-09 NOTE — Telephone Encounter (Signed)
The pharmacy has said the rx from 05-06-18 did not go through even though we have confirmation. I have called it in to the pharmacy.

## 2018-05-09 NOTE — Telephone Encounter (Signed)
°*  STAT* If patient is at the pharmacy, call can be transferred to refill team.   1. Which medications need to be refilled? (please list name of each medication and dose if known) Ativan   2. Which pharmacy/location (including street and city if local pharmacy) is medication to be sent to?[facility]  Prescription didn't go through.

## 2018-05-14 DIAGNOSIS — H16223 Keratoconjunctivitis sicca, not specified as Sjogren's, bilateral: Secondary | ICD-10-CM | POA: Diagnosis not present

## 2018-05-26 ENCOUNTER — Other Ambulatory Visit: Payer: Self-pay | Admitting: Internal Medicine

## 2018-05-26 NOTE — Telephone Encounter (Signed)
Name of Medication: Morphine Name of Pharmacy: [facility] or Written Date and Quantity: 04-02-18 #150 Last Office Visit and Type: 3 Month f/u 04-17-18 Next Office Visit and Type: 3 Month f/u 07-21-18 Last Controlled Substance Agreement Date: 10-28-17 Last UDS: 10-28-17

## 2018-06-02 ENCOUNTER — Other Ambulatory Visit: Payer: Self-pay | Admitting: Internal Medicine

## 2018-06-03 NOTE — Telephone Encounter (Signed)
Last filled 05-09-18 #30 Last OV 04-17-18 Next OV 07-21-18 [facility]

## 2018-06-23 DIAGNOSIS — H01115 Allergic dermatitis of left lower eyelid: Secondary | ICD-10-CM | POA: Diagnosis not present

## 2018-06-30 ENCOUNTER — Other Ambulatory Visit: Payer: Self-pay | Admitting: Internal Medicine

## 2018-06-30 NOTE — Telephone Encounter (Signed)
Last filled 06-06-18 #30 Last OV 04-17-18 Next OV 07-21-18 [facility]

## 2018-07-14 ENCOUNTER — Other Ambulatory Visit: Payer: Self-pay | Admitting: Internal Medicine

## 2018-07-14 NOTE — Telephone Encounter (Signed)
Name of Medication: Morphine Name of Pharmacy: [facility] or Written Date and Quantity: 05-26-18 #150 Last Office Visit and Type: 04-17-18 Acute Next Office Visit and Type: 07-21-18 3 Month F/U Last Controlled Substance Agreement Date: 10-28-17 Last UDS: 10-28-17

## 2018-07-21 ENCOUNTER — Encounter: Payer: Self-pay | Admitting: Internal Medicine

## 2018-07-21 ENCOUNTER — Ambulatory Visit: Payer: PPO | Admitting: Podiatry

## 2018-07-21 ENCOUNTER — Encounter: Payer: Self-pay | Admitting: Podiatry

## 2018-07-21 ENCOUNTER — Ambulatory Visit (INDEPENDENT_AMBULATORY_CARE_PROVIDER_SITE_OTHER): Payer: PPO | Admitting: Internal Medicine

## 2018-07-21 VITALS — BP 110/70 | HR 58 | Temp 98.1°F | Ht 66.0 in | Wt 83.0 lb

## 2018-07-21 DIAGNOSIS — F39 Unspecified mood [affective] disorder: Secondary | ICD-10-CM | POA: Diagnosis not present

## 2018-07-21 DIAGNOSIS — L98491 Non-pressure chronic ulcer of skin of other sites limited to breakdown of skin: Secondary | ICD-10-CM

## 2018-07-21 DIAGNOSIS — E440 Moderate protein-calorie malnutrition: Secondary | ICD-10-CM | POA: Diagnosis not present

## 2018-07-21 DIAGNOSIS — F1120 Opioid dependence, uncomplicated: Secondary | ICD-10-CM | POA: Diagnosis not present

## 2018-07-21 DIAGNOSIS — G629 Polyneuropathy, unspecified: Secondary | ICD-10-CM | POA: Diagnosis not present

## 2018-07-21 DIAGNOSIS — I739 Peripheral vascular disease, unspecified: Secondary | ICD-10-CM | POA: Diagnosis not present

## 2018-07-21 DIAGNOSIS — B0229 Other postherpetic nervous system involvement: Secondary | ICD-10-CM

## 2018-07-21 MED ORDER — ACYCLOVIR 400 MG PO TABS: 400.0000 mg | ORAL_TABLET | Freq: Four times a day (QID) | ORAL | 0 refills | Status: DC | PRN

## 2018-07-21 MED ORDER — LORAZEPAM 1 MG PO TABS: 0.5000 mg | ORAL_TABLET | Freq: Two times a day (BID) | ORAL | 0 refills | Status: DC | PRN

## 2018-07-21 NOTE — Assessment & Plan Note (Signed)
Will try weaning the topiramate

## 2018-07-21 NOTE — Patient Instructions (Signed)
Referral back to Dr. Harrell Gave End for re-evaluation for right ankle wound  [facility] [phone] [address], [place] [address]

## 2018-07-21 NOTE — Assessment & Plan Note (Signed)
Reviewed CSRS--no issues

## 2018-07-21 NOTE — Assessment & Plan Note (Signed)
Severe but may be worse from the meds than helped Will try off the acyclovir since no lesions now Try weaning off the topiramate

## 2018-07-21 NOTE — Assessment & Plan Note (Signed)
From the shingles Will adjust the medications

## 2018-07-21 NOTE — Addendum Note (Signed)
Addended by: Graceann Congress D on: 07/21/2018 04:10 PM   Modules accepted: Orders

## 2018-07-21 NOTE — Patient Instructions (Signed)
Please stop the acyclovir for now and only take if you have a recurrence of the sores in your scalp. Cut the topiramate to just at bedtime for the next 2 weeks. If your pain is not worse, you can try stopping it completely.

## 2018-07-21 NOTE — Progress Notes (Signed)
Subjective:    Patient ID: [name], female    DOB: Aug 21, 1927, 85 y.o.   MRN: [id number]  HPI Here with daughter for follow up of chronic pain from shingles Not happy with the morphine--- it helps some but makes her weak and sluggish Cut back on morphine due to this  Not happy about the medications Appetite is very bad No active shingles lesions Pain is not as bad in right V1---but is "sore"  Still having pain in foot--right Saw podiatrist today Asked her to start with salon pas patch  Lorazepam still helps her sleep  Current Outpatient Medications on File Prior to Visit  Medication Sig Dispense Refill  . acyclovir (ZOVIRAX) 400 MG tablet TAKE 1 TABLET BY MOUTH 4 TIMES DAILY 120 tablet 11  . aspirin 81 MG tablet Take 81 mg by mouth daily.      . fluticasone (FLONASE) 50 MCG/ACT nasal spray PLACE 2 SPRAYS INTO EACH NOSTRIL ONCE DAILY 16 g 11  . LORATADINE PO Take 10 mg by mouth daily.    [name] LORazepam (ATIVAN) 1 MG tablet TAKE 1/2 TO 1 TABLET (0.5MG  TO 1 MG TOTAL) BY MOUTH AT BEDTIME 30 tablet 0  . morphine (MSIR) 15 MG tablet TAKE 1 TABLET BY MOUTH EVERY 3 HOURS AS NEEDED FOR PAIN (Patient taking differently: Take 15 mg by mouth. TAKE 1 TABLET BY MOUTH EVERY 3 HOURS AS NEEDED FOR PAIN) 150 tablet 0  . Multiple Vitamin (MULTIVITAMIN) tablet Take 1 tablet by mouth once daily    . pantoprazole (PROTONIX) 40 MG tablet TAKE 1 TABLET BY MOUTH TWICE (2) DAILY 60 tablet 11  . polyethylene glycol (MIRALAX / GLYCOLAX) packet Take 17 g by mouth at bedtime.    . RESTASIS 0.05 % ophthalmic emulsion     . topiramate (TOPAMAX) 100 MG tablet TAKE 1 TABLET BY MOUTH TWICE (2) DAILY 60 tablet 11   No current facility-administered medications on file prior to visit.     Allergies  Allergen Reactions  . Duloxetine     REACTION: N/V Mental status change and trouble with balance  . Cymbalta [Duloxetine Hcl]     Unsure of reaction  . Maxitrol [Neomycin-Polymyxin-Dexameth]     Unsure of  reaction  . Pregabalin     REACTION: SOB, swollen lips  . Tobradex [Tobramycin-Dexamethasone]     Unsure of reaction    Past Medical History:  Diagnosis Date  . Arrhythmia   . Arthritis   . Atypical chest pain    Lexiscan myoview (5/11) with EF 84%, normal wall motion, small fixed apical  perfusion defect likely due to breast attenuation, no evidence for ischemia or infarction.  **Patient had an  . Breast cancer ([place])    Bilateral mastectomies 1986.  [name] CVA (cerebral infarction) 10/12   right lacunar  . Depression   . Dyspnea    Echo (5/11) was a difficult study due to breast implants but showed normal LV and RV size and systolic function.      [name] GERD (gastroesophageal reflux disease)   . Headache    s/p shingles - right side of head  . History of partial thyroidectomy   . Hyperlipidemia   . Hypertension    in past - no current meds/issues  . Obstruction of intestine ([place])   . Partial small bowel obstruction ([place]) 7/14   no surgery  . Wears dentures    full upper  . Zoster     with Post-herpetic neuralgia  Past Surgical History:  Procedure Laterality Date  . ABDOMINAL HYSTERECTOMY    . BREAST SURGERY Bilateral    cancer - mastectomy and implant insertion  . MASTECTOMY  1980   bilateral  . NECK SURGERY    . TARSORRHAPHY Bilateral 08/16/2015   Procedure: MINOR TARSORRAPHY LATERAL PLACEMENT;  Surgeon: Karle Starch, MD;  Location: [facility];  Service: Ophthalmology;  Laterality: Bilateral;  . VAGINAL DELIVERY      Family History  Problem Relation Age of Onset  . Heart disease Son        open heart surgery for a blockage  . Heart Problems Son   . Diabetes Son   . Parkinson's disease Son     Social History   Socioeconomic History  . Marital status: Widowed    Spouse name: Not on file  . Number of children: 2  . Years of education: Not on file  . Highest education level: Not on file  Occupational History  . Occupation: retired Glass blower/designer: RETIRED  Social Needs  . Financial resource strain: Not on file  . Food insecurity:    Worry: Not on file    Inability: Not on file  . Transportation needs:    Medical: Not on file    Non-medical: Not on file  Tobacco Use  . Smoking status: Former Research scientist (life sciences)  . Smokeless tobacco: Never Used  Substance and Sexual Activity  . Alcohol use: No    Alcohol/week: 0.0 standard drinks  . Drug use: No  . Sexual activity: Not on file  Lifestyle  . Physical activity:    Days per week: Not on file    Minutes per session: Not on file  . Stress: Not on file  Relationships  . Social connections:    Talks on phone: Not on file    Gets together: Not on file    Attends religious service: Not on file    Active member of club or organization: Not on file    Attends meetings of clubs or organizations: Not on file    Relationship status: Not on file  . Intimate partner violence:    Fear of current or ex partner: Not on file    Emotionally abused: Not on file    Physically abused: Not on file    Forced sexual activity: Not on file  Other Topics Concern  . Not on file  Social History Narrative   No living will   No health care POA but requests daughter-in-law [name] to do this   Would like attempts at resuscitation   No feeding tube if cognitively unaware   Review of Systems Poor appetite  Has lost another pound Having some soreness in ears Awakens with white foamy stuff in mouth---"like a clump of kleenex on her tongue" per daughter Changed to biotin mouthwash and toothpaste Daughter notes some confusion lately Did try 8 week trial through Dr Thomasene Ripple for her eye---oxervate---has affected her hair, etc    Objective:   Physical Exam  HENT:  TMs clear. Canal normal No active varicella lesions in scalp No oral lesions  Neck: No thyromegaly present.  Cardiovascular: Normal rate, regular rhythm and normal heart sounds. Exam reveals no gallop.  No murmur heard. Respiratory:  Effort normal and breath sounds normal. No respiratory distress. She has no wheezes. She has no rales.  Musculoskeletal:     Comments: Right foot cool without pulse Lateral malleolus granulated ulcer  Lymphadenopathy:  She has no cervical adenopathy.  Psychiatric:  Seems some depressed with her feeling bad           Assessment & Plan:

## 2018-07-21 NOTE — Assessment & Plan Note (Signed)
Depressed by pain and apparent side effects from medication Some degree of anxiety Sleeps only with the lorazepam

## 2018-07-21 NOTE — Progress Notes (Signed)
She presents today very confused that really of what is hurting her.  She states that her right lateral ankle still bothers her as she refers to the fibula malleolus.  Her daughter states that she had gone to a foot spot and had the scab rubbed off of the fibular malleolus.  She states is been hurting ever since that she denies fever chills nausea vomiting muscle aches and pains.  She is also stating that she is having posterior leg pain.  Objective: Vital signs are stable she is alert and oriented x3.  There is no erythema edema cellulitis drainage or odor she does have what appears to be skin breakdown overlying the distal fibula overlying the malleolus is appears to be a pre-ulcerative lesion or one that is healing there does appear to be some contractile tissue present scar tissue possibly.  Moderately tender on palpation.  She has no palpation on the in the Achilles or in the calf area.  She has been diagnosed.  Previously with small vessel disease by Dr. Saunders Revel.  Assessment: Small vessel disease lateral ulceration fibula.  Plan: I recommended that she return to her vascular doctor however the daughter-in-law is not thinking that this will do much good.  She is wondering if there is any alternative I expressed to her that she might would consider a salon poss patch lidocaine but if the area turned to the more red and painful that she needed to follow-up with the vascular doctors.  We are going to go ahead and put in a requisition for vascular evaluation.

## 2018-07-21 NOTE — Assessment & Plan Note (Signed)
Working with podiatrist on foot Sees Dr End

## 2018-07-28 DIAGNOSIS — H16231 Neurotrophic keratoconjunctivitis, right eye: Secondary | ICD-10-CM | POA: Diagnosis not present

## 2018-08-25 DIAGNOSIS — H16231 Neurotrophic keratoconjunctivitis, right eye: Secondary | ICD-10-CM | POA: Diagnosis not present

## 2018-09-03 ENCOUNTER — Other Ambulatory Visit: Payer: Self-pay | Admitting: Internal Medicine

## 2018-09-03 NOTE — Telephone Encounter (Signed)
Name of Medication: Morphine  Name of Pharmacy: [facility] or Written Date and Quantity: 07-14-18 #150 Last Office Visit and Type: 07-21-18 3 Month F/U Next Office Visit and Type: 11-03-18 3 Month F/U Last Controlled Substance Agreement Date: 10/28/17 Last UDS: 10/28/17

## 2018-09-15 ENCOUNTER — Other Ambulatory Visit: Payer: Self-pay | Admitting: Internal Medicine

## 2018-09-15 NOTE — Telephone Encounter (Signed)
Last filled 07-24-18 #60 Last OV 07-21-18 Next OV 11-03-18 [place]

## 2018-10-27 ENCOUNTER — Other Ambulatory Visit: Payer: Self-pay | Admitting: Internal Medicine

## 2018-10-27 NOTE — Telephone Encounter (Signed)
Name of Medication: Morphine Name of Pharmacy: [facility] or Written Date and Quantity: 09-03-18 #150 Last Office Visit and Type: 3 month f/u 07-21-18 Next Office Visit and Type: 3 Month f/u 11-03-18 Last Controlled Substance Agreement Date: 10-28-17 Last UDS: 10-28-17

## 2018-10-29 ENCOUNTER — Telehealth: Payer: Self-pay | Admitting: Internal Medicine

## 2018-10-29 NOTE — Telephone Encounter (Signed)
I spoke to patient's daughter, [name], about changing patient's office visit on 11/03/18 to a virtual office visit.  Her daughter said it's too complicated to do the virtual office visit. She said she's done several. I let her know this was easier, but she preferred to reschedule the appointment. She said patient's doing fine and she rescheduled appointment to 12/08/18

## 2018-10-30 NOTE — Telephone Encounter (Signed)
That is fine 

## 2018-11-03 ENCOUNTER — Ambulatory Visit: Payer: PPO | Admitting: Internal Medicine

## 2018-11-12 ENCOUNTER — Other Ambulatory Visit: Payer: Self-pay | Admitting: Internal Medicine

## 2018-11-12 NOTE — Telephone Encounter (Signed)
Last filled 09-15-18 #60 Last OV 07-21-18 Next OV 12-08-18 [place]

## 2018-11-24 DIAGNOSIS — H16223 Keratoconjunctivitis sicca, not specified as Sjogren's, bilateral: Secondary | ICD-10-CM | POA: Diagnosis not present

## 2018-12-03 ENCOUNTER — Encounter: Payer: Self-pay | Admitting: Podiatry

## 2018-12-03 ENCOUNTER — Other Ambulatory Visit: Payer: Self-pay

## 2018-12-03 ENCOUNTER — Ambulatory Visit: Payer: PPO | Admitting: Podiatry

## 2018-12-03 VITALS — Temp 98.6°F

## 2018-12-03 DIAGNOSIS — M79676 Pain in unspecified toe(s): Secondary | ICD-10-CM | POA: Diagnosis not present

## 2018-12-03 DIAGNOSIS — L98491 Non-pressure chronic ulcer of skin of other sites limited to breakdown of skin: Secondary | ICD-10-CM | POA: Diagnosis not present

## 2018-12-03 DIAGNOSIS — B351 Tinea unguium: Secondary | ICD-10-CM | POA: Diagnosis not present

## 2018-12-03 NOTE — Progress Notes (Signed)
She presents today with her daughter for follow-up of the painful elongated toenails and the ulcer to the plantar lateral aspect of her fibula right.  Objective: Vital signs are stable she alert oriented x3 pulses are nonpalpable right.  No open lesions or wounds but I did debride the reactive hyperkeratosis to the lateral aspect of the fibula.  There is no bleeding noted.  No purulence no malodor no signs of infection.  Also debrided painful elongated toenails 1 through 5 bilaterally.  Assessment: Pain limb secondary to onychomycosis and ischemic ulcer lateral aspect of the right foot.  Plan: Debridement of nails today and continue to watch the ulceration making sure that it does not worsen.

## 2018-12-08 ENCOUNTER — Encounter: Payer: Self-pay | Admitting: Internal Medicine

## 2018-12-08 ENCOUNTER — Ambulatory Visit (INDEPENDENT_AMBULATORY_CARE_PROVIDER_SITE_OTHER): Payer: PPO | Admitting: Internal Medicine

## 2018-12-08 ENCOUNTER — Telehealth: Payer: Self-pay | Admitting: Internal Medicine

## 2018-12-08 VITALS — Wt 82.0 lb

## 2018-12-08 DIAGNOSIS — B0229 Other postherpetic nervous system involvement: Secondary | ICD-10-CM

## 2018-12-08 DIAGNOSIS — E440 Moderate protein-calorie malnutrition: Secondary | ICD-10-CM

## 2018-12-08 DIAGNOSIS — G629 Polyneuropathy, unspecified: Secondary | ICD-10-CM

## 2018-12-08 DIAGNOSIS — F1320 Sedative, hypnotic or anxiolytic dependence, uncomplicated: Secondary | ICD-10-CM

## 2018-12-08 DIAGNOSIS — F1120 Opioid dependence, uncomplicated: Secondary | ICD-10-CM

## 2018-12-08 NOTE — Progress Notes (Signed)
Subjective:    Patient ID: [name], female    DOB: 1928-03-09, 85 y.o.   MRN: [id number]  HPI Visit for review of chronic pain and narcotic dependence  Interactive audio and video telecommunications were attempted between this provider and patient, however failed, due to patient having technical difficulties OR patient did not have access to video capability.  We continued and completed visit with audio only.   Virtual Visit via Telephone Note  I connected with [name] on 12/08/18 at  2:45 PM EDT by telephone and verified that I am speaking with the correct person using two identifiers.  Location: Patient: home Provider: office   I discussed the limitations, risks, security and privacy concerns of performing an evaluation and management service by telephone and the availability of in person appointments. I also discussed with the patient that there may be a patient responsible charge related to this service. The patient expressed understanding and agreed to proceed.   History of Present Illness: She is doing okay Trying to adjust to the social distancing--mostly staying home Does go out on the porch and walks outsider her house Did go to the podiatrist  Pain issues are about the same Uses the morphine three times a day mostly  Uses the lorazepam at bedtime This helps her sleep  Not feeling depressed Does have some anxiety----restless at times   Hasn't weighed herself lately She feels her appetite is fine Drinks 3 boost a day  Current Outpatient Medications on File Prior to Visit  Medication Sig Dispense Refill  . acyclovir (ZOVIRAX) 400 MG tablet Take 1 tablet (400 mg total) by mouth 4 (four) times daily as needed. 1 tablet 0  . aspirin 81 MG tablet Take 81 mg by mouth daily.      . fluticasone (FLONASE) 50 MCG/ACT nasal spray PLACE 2 SPRAYS INTO EACH NOSTRIL ONCE DAILY 16 g 11  . LORATADINE PO Take 10 mg by mouth daily.    [name] LORazepam (ATIVAN) 1 MG tablet  TAKE 1/2 TO 1 TABLET (0.5-1 MG TOTAL) BYMOUTH TWICE DAILY AS NEEDED FOR ANXIETY 60 tablet 0  . morphine (MSIR) 15 MG tablet TAKE 1 TABLET BY MOUTH EVERY 3 HOURS AS NEEDED FOR PAIN 150 tablet 0  . Multiple Vitamin (MULTIVITAMIN) tablet Take 1 tablet by mouth once daily    . pantoprazole (PROTONIX) 40 MG tablet TAKE 1 TABLET BY MOUTH TWICE (2) DAILY 60 tablet 11  . polyethylene glycol (MIRALAX / GLYCOLAX) packet Take 17 g by mouth at bedtime.    . RESTASIS 0.05 % ophthalmic emulsion     . topiramate (TOPAMAX) 100 MG tablet TAKE 1 TABLET BY MOUTH TWICE (2) DAILY 60 tablet 11   No current facility-administered medications on file prior to visit.     Allergies  Allergen Reactions  . Duloxetine     REACTION: N/V Mental status change and trouble with balance  . Cymbalta [Duloxetine Hcl]     Unsure of reaction  . Maxitrol [Neomycin-Polymyxin-Dexameth]     Unsure of reaction  . Pregabalin     REACTION: SOB, swollen lips  . Tobradex [Tobramycin-Dexamethasone]     Unsure of reaction    Past Medical History:  Diagnosis Date  . Arrhythmia   . Arthritis   . Atypical chest pain    Lexiscan myoview (5/11) with EF 84%, normal wall motion, small fixed apical  perfusion defect likely due to breast attenuation, no evidence for ischemia or infarction.  **Patient had an  .  Breast cancer ([place])    Bilateral mastectomies 1986.  [name] CVA (cerebral infarction) 10/12   right lacunar  . Depression   . Dyspnea    Echo (5/11) was a difficult study due to breast implants but showed normal LV and RV size and systolic function.      [name] GERD (gastroesophageal reflux disease)   . Headache    s/p shingles - right side of head  . History of partial thyroidectomy   . Hyperlipidemia   . Hypertension    in past - no current meds/issues  . Obstruction of intestine ([place])   . Partial small bowel obstruction ([place]) 7/14   no surgery  . Wears dentures    full upper  . Zoster     with Post-herpetic neuralgia     Past Surgical History:  Procedure Laterality Date  . ABDOMINAL HYSTERECTOMY    . BREAST SURGERY Bilateral    cancer - mastectomy and implant insertion  . MASTECTOMY  1980   bilateral  . NECK SURGERY    . TARSORRHAPHY Bilateral 08/16/2015   Procedure: MINOR TARSORRAPHY LATERAL PLACEMENT;  Surgeon: Karle Starch, MD;  Location: [facility];  Service: Ophthalmology;  Laterality: Bilateral;  . VAGINAL DELIVERY      Family History  Problem Relation Age of Onset  . Heart disease Son        open heart surgery for a blockage  . Heart Problems Son   . Diabetes Son   . Parkinson's disease Son     Social History   Socioeconomic History  . Marital status: Widowed    Spouse name: Not on file  . Number of children: 2  . Years of education: Not on file  . Highest education level: Not on file  Occupational History  . Occupation: retired Museum/gallery exhibitions officer: RETIRED  Social Needs  . Financial resource strain: Not on file  . Food insecurity:    Worry: Not on file    Inability: Not on file  . Transportation needs:    Medical: Not on file    Non-medical: Not on file  Tobacco Use  . Smoking status: Former Research scientist (life sciences)  . Smokeless tobacco: Never Used  Substance and Sexual Activity  . Alcohol use: No    Alcohol/week: 0.0 standard drinks  . Drug use: No  . Sexual activity: Not on file  Lifestyle  . Physical activity:    Days per week: Not on file    Minutes per session: Not on file  . Stress: Not on file  Relationships  . Social connections:    Talks on phone: Not on file    Gets together: Not on file    Attends religious service: Not on file    Active member of club or organization: Not on file    Attends meetings of clubs or organizations: Not on file    Relationship status: Not on file  . Intimate partner violence:    Fear of current or ex partner: Not on file    Emotionally abused: Not on file    Physically abused: Not on file    Forced sexual activity: Not on file   Other Topics Concern  . Not on file  Social History Narrative   No living will   No health care POA but requests daughter-in-law [name] to do this   Would like attempts at resuscitation   No feeding tube if cognitively unaware     Observations/Objective: Normal conversation No  apparent depression   Assessment and Plan: See problem list  Follow Up Instructions:    I discussed the assessment and treatment plan with the patient. The patient was provided an opportunity to ask questions and all were answered. The patient agreed with the plan and demonstrated an understanding of the instructions.   The patient was advised to call back or seek an in-person evaluation if the symptoms worsen or if the condition fails to improve as anticipated.  I provided 11 minutes of non-face-to-face time during this encounter.   Viviana Simpler, MD    Review of Systems     Objective:   Physical Exam         Assessment & Plan:

## 2018-12-08 NOTE — Telephone Encounter (Signed)
I left a detailed message on patient's voice mail to call back and schedule her annual wellness visit in 3 months (September).

## 2018-12-08 NOTE — Assessment & Plan Note (Signed)
Weight 1# less but on her scale Continues on the ensure --3 daily

## 2018-12-08 NOTE — Assessment & Plan Note (Signed)
PDMP reviewed No concerns 

## 2018-12-08 NOTE — Assessment & Plan Note (Signed)
Uses the lorazepam at night Seems to be symbiotic with the morphine to allow sleep No problems with PDMP

## 2018-12-08 NOTE — Assessment & Plan Note (Signed)
Using the morphine as her usual

## 2018-12-08 NOTE — Assessment & Plan Note (Signed)
Chronic and severe Seems under control

## 2018-12-19 ENCOUNTER — Other Ambulatory Visit: Payer: Self-pay | Admitting: Internal Medicine

## 2018-12-22 NOTE — Telephone Encounter (Signed)
Name of Medication: MSIR  Name of Pharmacy: [facility] or Written Date and Quantity: 10-27-18 #150 Last Office Visit and Type: 12-08-18 Next Office Visit and Type: 04-27-19 Last Controlled Substance Agreement Date: 10-28-17 Last UDS: 10-28-17

## 2019-01-12 DIAGNOSIS — H16223 Keratoconjunctivitis sicca, not specified as Sjogren's, bilateral: Secondary | ICD-10-CM | POA: Diagnosis not present

## 2019-01-19 ENCOUNTER — Other Ambulatory Visit: Payer: Self-pay | Admitting: Internal Medicine

## 2019-01-19 NOTE — Telephone Encounter (Signed)
Last filled 11-12-18 #60 Last OV 12-08-18 Next OV 10=19-20 [place]

## 2019-01-26 DIAGNOSIS — Z961 Presence of intraocular lens: Secondary | ICD-10-CM | POA: Diagnosis not present

## 2019-02-09 ENCOUNTER — Other Ambulatory Visit: Payer: Self-pay | Admitting: Internal Medicine

## 2019-02-09 NOTE — Telephone Encounter (Signed)
Name of Medication: Morphine Name of Pharmacy: [facility] or Written Date and Quantity: 10-27-18 #150 Last Office Visit and Type: 12-08-18 Next Office Visit and Type: 04-27-19 Last Controlled Substance Agreement Date: 10-28-17 Last UDS: 10-28-17

## 2019-02-27 ENCOUNTER — Ambulatory Visit: Payer: PPO | Admitting: Podiatry

## 2019-03-03 ENCOUNTER — Other Ambulatory Visit: Payer: Self-pay

## 2019-03-03 ENCOUNTER — Ambulatory Visit: Payer: PPO | Admitting: Podiatry

## 2019-03-03 DIAGNOSIS — R0989 Other specified symptoms and signs involving the circulatory and respiratory systems: Secondary | ICD-10-CM

## 2019-03-03 DIAGNOSIS — G609 Hereditary and idiopathic neuropathy, unspecified: Secondary | ICD-10-CM

## 2019-03-05 ENCOUNTER — Other Ambulatory Visit: Payer: Self-pay | Admitting: Internal Medicine

## 2019-03-05 MED ORDER — NONFORMULARY OR COMPOUNDED ITEM: 2 refills | Status: DC

## 2019-03-05 NOTE — Progress Notes (Signed)
   HPI: 85 y.o. female presenting today with a chief complaint of pain and swelling noted to the bilateral feet and legs that has been ongoing for the past several weeks. She reports associated redness/purplish, itching, burning skin of the feet and legs. She has not done anything for treatment and denies modifying factors. Patient is here for further evaluation and treatment.   Past Medical History:  Diagnosis Date  . Arrhythmia   . Arthritis   . Atypical chest pain    Lexiscan myoview (5/11) with EF 84%, normal wall motion, small fixed apical  perfusion defect likely due to breast attenuation, no evidence for ischemia or infarction.  **Patient had an  . Breast cancer ([place])    Bilateral mastectomies 1986.  [name] CVA (cerebral infarction) 10/12   right lacunar  . Depression   . Dyspnea    Echo (5/11) was a difficult study due to breast implants but showed normal LV and RV size and systolic function.      [name] GERD (gastroesophageal reflux disease)   . Headache    s/p shingles - right side of head  . History of partial thyroidectomy   . Hyperlipidemia   . Hypertension    in past - no current meds/issues  . Obstruction of intestine ([place])   . Partial small bowel obstruction ([place]) 7/14   no surgery  . Wears dentures    full upper  . Zoster     with Post-herpetic neuralgia     Physical Exam: General: The patient is alert and oriented x3 in no acute distress.  Dermatology: Skin is warm, dry and supple bilateral lower extremities. Negative for open lesions or macerations.  Vascular: Bilateral lower extremity edema noted. Venous insufficiency noted to the bilateral lower extremities. Palpable pedal pulses bilaterally. No erythema noted. Capillary refill within normal limits.  Neurological: Epicritic and protective threshold diminished bilaterally.   Musculoskeletal Exam: Range of motion within normal limits to all pedal and ankle joints bilateral. Muscle strength 5/5 in all groups  bilateral.   Assessment: 1. BLE neuropathy 2. BLE edema 3. Venous insufficiency BLE   Plan of Care:  1. Patient evaluated.   2. Prescription for neuropathic pain cream to be dispensed by [facility].  3. Recommended compression hose daily.  4. Return to clinic as needed.      Edrick Kins, DPM [facility]  Dr. Edrick Kins, DPM    [address], [place] [address]                Office [phone]  Fax [phone]

## 2019-03-23 ENCOUNTER — Other Ambulatory Visit: Payer: Self-pay | Admitting: Internal Medicine

## 2019-03-23 NOTE — Telephone Encounter (Signed)
Lorazepam last filled 01-19-19 #60  Name of Medication: Morphine Name of Pharmacy: [facility] or Written Date and Quantity: 02-09-19 #150  Last Office Visit and Type: 12-08-18 Next Office Visit and Type: 04-27-19 Last Controlled Substance Agreement Date: 10-28-17 Last UDS: 10-28-17

## 2019-04-09 DIAGNOSIS — H5713 Ocular pain, bilateral: Secondary | ICD-10-CM | POA: Diagnosis not present

## 2019-04-27 ENCOUNTER — Ambulatory Visit (INDEPENDENT_AMBULATORY_CARE_PROVIDER_SITE_OTHER): Payer: PPO | Admitting: Internal Medicine

## 2019-04-27 ENCOUNTER — Encounter: Payer: Self-pay | Admitting: Internal Medicine

## 2019-04-27 ENCOUNTER — Other Ambulatory Visit: Payer: Self-pay

## 2019-04-27 VITALS — BP 120/80 | HR 66 | Ht 65.0 in | Wt 81.5 lb

## 2019-04-27 DIAGNOSIS — B0229 Other postherpetic nervous system involvement: Secondary | ICD-10-CM

## 2019-04-27 DIAGNOSIS — F1120 Opioid dependence, uncomplicated: Secondary | ICD-10-CM

## 2019-04-27 DIAGNOSIS — Z7189 Other specified counseling: Secondary | ICD-10-CM | POA: Diagnosis not present

## 2019-04-27 DIAGNOSIS — D696 Thrombocytopenia, unspecified: Secondary | ICD-10-CM

## 2019-04-27 DIAGNOSIS — Z0000 Encounter for general adult medical examination without abnormal findings: Secondary | ICD-10-CM | POA: Diagnosis not present

## 2019-04-27 DIAGNOSIS — I739 Peripheral vascular disease, unspecified: Secondary | ICD-10-CM | POA: Diagnosis not present

## 2019-04-27 DIAGNOSIS — F39 Unspecified mood [affective] disorder: Secondary | ICD-10-CM

## 2019-04-27 MED ORDER — TOPIRAMATE 100 MG PO TABS: 50.0000 mg | ORAL_TABLET | Freq: Every day | ORAL | 11 refills | Status: DC

## 2019-04-27 NOTE — Assessment & Plan Note (Signed)
Chronic depression and anxiety--mostly related to her chronic pain Lorazepam only

## 2019-04-27 NOTE — Assessment & Plan Note (Signed)
Chronic and painful On the morphine (only tolerates rapid acting meds) Will try to decrease the topiramate

## 2019-04-27 NOTE — Progress Notes (Signed)
Subjective:    Patient ID: [name], female    DOB: January 19, 1928, 85 y.o.   MRN: [id number]  HPI Here with daughter-in-law for Medicare wellness visit and follow up of chronic health conditions Reviewed form and advanced directives Reviewed other doctors No alcohol or tobacco Not able to exercise Hearing is poor Vision okay in left eye Doesn't drive. Does do all ADLs on her own. Daughter does most of the housework, bills, meds (lives in her own apartment and DIL comes daily or more).  Has MedAlert necklace No falls Mild memory issues  Will have some depressed days Gets to thinking about what she used to do and now can't  Some degree of anhedonia (she denies it though)--COVID has been an issue Uses the lorazepam for anxiety  Satisfied with the morphine for pain Tried to decrease the topiramate but didn't tolerate--only takes it at bedtimes Still has chronic pain though from the zoster Still feels itching and pain over right temple Only taking the acyclovir once a day now  Can't gain weight Daughter feels bad because she brings her enough food ?topimax affecting her appetite--discussed trying to cut the dose more Does take 3 boost daily still Needs new plate but can't do that till she gains some weight  Bruises really easily Had still been on aspirin-discussed stopping this  Reviewed labs GFR last 44  Current Outpatient Medications on File Prior to Visit  Medication Sig Dispense Refill  . acyclovir (ZOVIRAX) 400 MG tablet Take 1 tablet (400 mg total) by mouth 4 (four) times daily as needed. 1 tablet 0  . aspirin 81 MG tablet Take 81 mg by mouth daily.      . fluticasone (FLONASE) 50 MCG/ACT nasal spray PLACE 2 SPRAYS INTO EACH NOSTRIL ONCE DAILY 16 g 11  . LORATADINE PO Take 10 mg by mouth daily.    [name] LORazepam (ATIVAN) 1 MG tablet TAKE HALF TO 1 TABLET (0.5-1 MG TOTAL) BY MOUTH TWICE DAILY AS NEEDEDFOR ANXIETY 60 tablet 0  . morphine (MSIR) 15 MG tablet TAKE 1  TABLET BY MOUTH EVERY 3 HOURS AS NEEDED FOR PAIN 150 tablet 0  . Multiple Vitamin (MULTIVITAMIN) tablet Take 1 tablet by mouth once daily    . NONFORMULARY OR COMPOUNDED ITEM See pharmacy note 120 each 2  . pantoprazole (PROTONIX) 40 MG tablet TAKE 1 TABLET BY MOUTH TWICE DAILY 60 tablet 11  . polyethylene glycol (MIRALAX / GLYCOLAX) packet Take 17 g by mouth at bedtime.    . RESTASIS 0.05 % ophthalmic emulsion     . topiramate (TOPAMAX) 100 MG tablet TAKE 1 TABLET BY MOUTH TWICE (2) DAILY 60 tablet 11   No current facility-administered medications on file prior to visit.     Allergies  Allergen Reactions  . Duloxetine     REACTION: N/V Mental status change and trouble with balance  . Cymbalta [Duloxetine Hcl]     Unsure of reaction  . Maxitrol [Neomycin-Polymyxin-Dexameth]     Unsure of reaction  . Pregabalin     REACTION: SOB, swollen lips  . Tobradex [Tobramycin-Dexamethasone]     Unsure of reaction    Past Medical History:  Diagnosis Date  . Arrhythmia   . Arthritis   . Atypical chest pain    Lexiscan myoview (5/11) with EF 84%, normal wall motion, small fixed apical  perfusion defect likely due to breast attenuation, no evidence for ischemia or infarction.  **Patient had an  . Breast cancer ([place])  Bilateral mastectomies 1986.  [name] CVA (cerebral infarction) 10/12   right lacunar  . Depression   . Dyspnea    Echo (5/11) was a difficult study due to breast implants but showed normal LV and RV size and systolic function.      [name] GERD (gastroesophageal reflux disease)   . Headache    s/p shingles - right side of head  . History of partial thyroidectomy   . Hyperlipidemia   . Hypertension    in past - no current meds/issues  . Obstruction of intestine ([place])   . Partial small bowel obstruction ([place]) 7/14   no surgery  . Wears dentures    full upper  . Zoster     with Post-herpetic neuralgia    Past Surgical History:  Procedure Laterality Date  . ABDOMINAL  HYSTERECTOMY    . BREAST SURGERY Bilateral    cancer - mastectomy and implant insertion  . MASTECTOMY  1980   bilateral  . NECK SURGERY    . TARSORRHAPHY Bilateral 08/16/2015   Procedure: MINOR TARSORRAPHY LATERAL PLACEMENT;  Surgeon: Karle Starch, MD;  Location: [facility];  Service: Ophthalmology;  Laterality: Bilateral;  . VAGINAL DELIVERY      Family History  Problem Relation Age of Onset  . Heart disease Son        open heart surgery for a blockage  . Heart Problems Son   . Diabetes Son   . Parkinson's disease Son     Social History   Socioeconomic History  . Marital status: Widowed    Spouse name: Not on file  . Number of children: 2  . Years of education: Not on file  . Highest education level: Not on file  Occupational History  . Occupation: retired Museum/gallery exhibitions officer: RETIRED  Social Needs  . Financial resource strain: Not on file  . Food insecurity    Worry: Not on file    Inability: Not on file  . Transportation needs    Medical: Not on file    Non-medical: Not on file  Tobacco Use  . Smoking status: Former Research scientist (life sciences)  . Smokeless tobacco: Never Used  Substance and Sexual Activity  . Alcohol use: No    Alcohol/week: 0.0 standard drinks  . Drug use: No  . Sexual activity: Not on file  Lifestyle  . Physical activity    Days per week: Not on file    Minutes per session: Not on file  . Stress: Not on file  Relationships  . Social Herbalist on phone: Not on file    Gets together: Not on file    Attends religious service: Not on file    Active member of club or organization: Not on file    Attends meetings of clubs or organizations: Not on file    Relationship status: Not on file  . Intimate partner violence    Fear of current or ex partner: Not on file    Emotionally abused: Not on file    Physically abused: Not on file    Forced sexual activity: Not on file  Other Topics Concern  . Not on file  Social History Narrative    No living will   No health care POA but requests daughter-in-law [name] to do this   Would like attempts at resuscitation   No feeding tube if cognitively unaware   Review of Systems Sleeps okay Somewhat unstable walking---uses cane Mild urinary symptoms ---"  not bad though" No heartburn but does have some dysphagia with eating (chokes at times). Continues on the protonix Has phlegm in throat--?drainage. Does use allergy medication Bowels move fairly well with miralax No suspicious skin lesions No sig back or joint pains---just minor. Some leg pain at times     Objective:   Physical Exam  Constitutional: She is oriented to person, place, and time.  Same wasting   HENT:  Mouth/Throat: Oropharynx is clear and moist. No oropharyngeal exudate.  Neck: No thyromegaly present.  Cardiovascular: Normal rate, regular rhythm and normal heart sounds. Exam reveals no gallop.  No murmur heard. Feet cool Very faint pulses Right foot purplish  Respiratory: Effort normal and breath sounds normal. No respiratory distress. She has no wheezes. She has no rales.  GI: Soft. There is no abdominal tenderness.  Musculoskeletal:        General: No tenderness or edema.  Lymphadenopathy:    She has no cervical adenopathy.  Neurological: She is alert and oriented to person, place, and time.  President--- "Cecilie Lowers---- Bush" 100-103-? [id number] Recall 2/3  Skin:  No scalp lesions  Psychiatric:  Mild depressed mood           Assessment & Plan:

## 2019-04-27 NOTE — Assessment & Plan Note (Signed)
Decreased circulation to feet but no claudication

## 2019-04-27 NOTE — Assessment & Plan Note (Signed)
Mild but notable easy bruising Will recheck and stop the aspirin

## 2019-04-27 NOTE — Assessment & Plan Note (Signed)
I have personally reviewed the Medicare Annual Wellness questionnaire and have noted  1. The patient's medical and social history  2. Their use of alcohol, tobacco or illicit drugs  3. Their current medications and supplements  4. The patient's functional ability including ADL's, fall risks, home safety risks and hearing or visual              impairment.  5. Diet and physical activities  6. Evidence for depression or mood disorders  The patients weight, height, BMI and visual acuity have been recorded in the chart  I have made referrals, counseling and provided education to the patient based review of the above and I have provided the pt with a written personalized care plan for preventive services.   I have provided you with a copy of your personalized plan for preventive services. Please take the time to review along with your updated medication list.  No cancer screening due to age Flu vaccine today Is keeping social distance, etc. DIL providing for her needs

## 2019-04-27 NOTE — Progress Notes (Signed)
Hearing Screening (Inadequate exam)   Method: Audiometry   125Hz  250Hz  500Hz  1000Hz  2000Hz  3000Hz  4000Hz  6000Hz  8000Hz   Right ear:   0 0 0  0    Left ear:   0 0 40  0    Vision Screening Comments: October 2020

## 2019-04-27 NOTE — Assessment & Plan Note (Signed)
See social history 

## 2019-04-27 NOTE — Assessment & Plan Note (Signed)
PDMP reviewed.

## 2019-04-28 LAB — COMPREHENSIVE METABOLIC PANEL
ALT: 13 U/L (ref 0–35)
AST: 20 U/L (ref 0–37)
Albumin: 4.2 g/dL (ref 3.5–5.2)
Alkaline Phosphatase: 55 U/L (ref 39–117)
BUN: 23 mg/dL (ref 6–23)
CO2: 27 mEq/L (ref 19–32)
Calcium: 9.3 mg/dL (ref 8.4–10.5)
Chloride: 108 mEq/L (ref 96–112)
Creatinine, Ser: 0.84 mg/dL (ref 0.40–1.20)
GFR: 63.73 mL/min (ref >60.00)
Glucose, Bld: 105 mg/dL — ABNORMAL HIGH (ref 70–99)
Potassium: 4.7 mEq/L (ref 3.5–5.1)
Sodium: 142 mEq/L (ref 135–145)
Total Bilirubin: 0.4 mg/dL (ref 0.2–1.2)
Total Protein: 7 g/dL (ref 6.0–8.3)

## 2019-04-28 LAB — CBC
HCT: 41 % (ref 36.0–46.0)
Hemoglobin: 13.3 g/dL (ref 12.0–15.0)
MCHC: 32.5 g/dL (ref 30.0–36.0)
MCV: 99.4 fl (ref 78.0–100.0)
Platelets: 140 10*3/uL — ABNORMAL LOW (ref 150.0–400.0)
RBC: 4.13 Mil/uL (ref 3.87–5.11)
RDW: 13.9 % (ref 11.5–15.5)
WBC: 6.9 10*3/uL (ref 4.0–10.5)

## 2019-04-28 LAB — T4, FREE: Free T4: 0.82 ng/dL (ref 0.60–1.60)

## 2019-04-29 LAB — PAIN MGMT, PROFILE 8 W/CONF, U
6 Acetylmorphine: NEGATIVE ng/mL
Alcohol Metabolites: NEGATIVE ng/mL (ref <500)
Alphahydroxyalprazolam: NEGATIVE ng/mL
Alphahydroxymidazolam: NEGATIVE ng/mL
Alphahydroxytriazolam: NEGATIVE ng/mL
Aminoclonazepam: NEGATIVE ng/mL
Amphetamines: NEGATIVE ng/mL
Benzodiazepines: POSITIVE ng/mL
Buprenorphine, Urine: NEGATIVE ng/mL
Cocaine Metabolite: NEGATIVE ng/mL
Codeine: NEGATIVE ng/mL
Creatinine: 94.7 mg/dL
Hydrocodone: NEGATIVE ng/mL
Hydromorphone: 456 ng/mL
Hydroxyethylflurazepam: NEGATIVE ng/mL
Lorazepam: 1843 ng/mL
MDMA: NEGATIVE ng/mL
Marijuana Metabolite: NEGATIVE ng/mL
Morphine: >10000 ng/mL
Nordiazepam: NEGATIVE ng/mL
Norhydrocodone: NEGATIVE ng/mL
Opiates: POSITIVE ng/mL
Oxazepam: NEGATIVE ng/mL
Oxidant: NEGATIVE ug/mL
Oxycodone: NEGATIVE ng/mL
Temazepam: NEGATIVE ng/mL
pH: 5.8 (ref 4.5–9.0)

## 2019-05-11 DIAGNOSIS — H16231 Neurotrophic keratoconjunctivitis, right eye: Secondary | ICD-10-CM | POA: Diagnosis not present

## 2019-05-18 ENCOUNTER — Other Ambulatory Visit: Payer: Self-pay | Admitting: Internal Medicine

## 2019-05-18 NOTE — Telephone Encounter (Signed)
Name of Medication: Morphine (MSIR) Name of Pharmacy: [facility] or Written Date and Quantity: 03-23-19 #150 Last Office Visit and Type: 04-27-19 Next Office Visit and Type: 08-17-19 Last Controlled Substance Agreement Date: 04-27-19 Last UDS:04-27-19  Lorazepam last filled 03-23-19 #60

## 2019-06-03 ENCOUNTER — Other Ambulatory Visit: Payer: Self-pay

## 2019-06-09 ENCOUNTER — Other Ambulatory Visit: Payer: Self-pay | Admitting: Internal Medicine

## 2019-06-18 DIAGNOSIS — C4491 Basal cell carcinoma of skin, unspecified: Secondary | ICD-10-CM

## 2019-06-18 DIAGNOSIS — C44712 Basal cell carcinoma of skin of right lower limb, including hip: Secondary | ICD-10-CM | POA: Diagnosis not present

## 2019-06-18 DIAGNOSIS — D485 Neoplasm of uncertain behavior of skin: Secondary | ICD-10-CM | POA: Diagnosis not present

## 2019-06-18 DIAGNOSIS — B0229 Other postherpetic nervous system involvement: Secondary | ICD-10-CM | POA: Diagnosis not present

## 2019-06-18 DIAGNOSIS — L89519 Pressure ulcer of right ankle, unspecified stage: Secondary | ICD-10-CM | POA: Diagnosis not present

## 2019-06-18 HISTORY — DX: Basal cell carcinoma of skin, unspecified: C44.91

## 2019-06-19 DIAGNOSIS — E11621 Type 2 diabetes mellitus with foot ulcer: Secondary | ICD-10-CM | POA: Diagnosis not present

## 2019-06-22 DIAGNOSIS — M3501 Sicca syndrome with keratoconjunctivitis: Secondary | ICD-10-CM | POA: Diagnosis not present

## 2019-07-13 ENCOUNTER — Other Ambulatory Visit: Payer: Self-pay | Admitting: Internal Medicine

## 2019-07-13 NOTE — Telephone Encounter (Signed)
Name of Medication: Morphine Name of Pharmacy: [facility] or Written Date and Quantity: 05-28-19 #150 Last Office Visit and Type: 04-27-19 Next Office Visit and Type: 08-17-19 Last Controlled Substance Agreement Date: 04-27-19 Last UDS: 04-27-19

## 2019-07-20 ENCOUNTER — Other Ambulatory Visit: Payer: Self-pay | Admitting: Internal Medicine

## 2019-07-20 NOTE — Telephone Encounter (Signed)
Last filled 05-18-19 #60 Last OV 04-27-19 Next OV 08-17-19 [place]

## 2019-07-28 DIAGNOSIS — L89519 Pressure ulcer of right ankle, unspecified stage: Secondary | ICD-10-CM | POA: Diagnosis not present

## 2019-07-28 DIAGNOSIS — C44712 Basal cell carcinoma of skin of right lower limb, including hip: Secondary | ICD-10-CM | POA: Diagnosis not present

## 2019-08-14 ENCOUNTER — Telehealth: Payer: Self-pay | Admitting: Internal Medicine

## 2019-08-14 NOTE — Chronic Care Management (AMB) (Signed)
  Chronic Care Management   Note  08/14/2019 Name: [name] MRN: [id number] DOB: Nov 29, 1927  [name] is a 86 y.o. year old female who is a primary care patient of [place], Theophilus Kinds, MD. I reached out to [name] by phone today in response to a referral sent by [name]'s PCP, Venia Carbon, MD. Patient daughter, [name] will assist her with the phone visit.  [name] was given information about Chronic Care Management services today including:  1. CCM service includes personalized support from designated clinical staff supervised by her physician, including individualized plan of care and coordination with other care providers 2. 24/7 contact phone numbers for assistance for urgent and routine care needs. 3. Service will only be billed when office clinical staff spend 20 minutes or more in a month to coordinate care. 4. Only one practitioner may furnish and bill the service in a calendar month. 5. The patient may stop CCM services at any time (effective at the end of the month) by phone call to the office staff. 6. The patient will be responsible for cost sharing (co-pay) of up to 20% of the service fee (after annual deductible is met).  Patient agreed to services and verbal consent obtained.   Follow up plan:   [name] [facility] Scheduler

## 2019-08-17 ENCOUNTER — Encounter: Payer: Self-pay | Admitting: Internal Medicine

## 2019-08-17 ENCOUNTER — Other Ambulatory Visit: Payer: Self-pay

## 2019-08-17 ENCOUNTER — Ambulatory Visit (INDEPENDENT_AMBULATORY_CARE_PROVIDER_SITE_OTHER): Payer: PPO | Admitting: Internal Medicine

## 2019-08-17 DIAGNOSIS — F1120 Opioid dependence, uncomplicated: Secondary | ICD-10-CM | POA: Diagnosis not present

## 2019-08-17 DIAGNOSIS — B0229 Other postherpetic nervous system involvement: Secondary | ICD-10-CM | POA: Diagnosis not present

## 2019-08-17 DIAGNOSIS — E440 Moderate protein-calorie malnutrition: Secondary | ICD-10-CM

## 2019-08-17 MED ORDER — ACYCLOVIR 400 MG PO TABS: 400.0000 mg | ORAL_TABLET | Freq: Four times a day (QID) | ORAL | 0 refills | Status: DC

## 2019-08-17 NOTE — Assessment & Plan Note (Signed)
Gained slightly with the decreased topamax dose--but needs to go back up

## 2019-08-17 NOTE — Progress Notes (Signed)
Subjective:    Patient ID: [name], female    DOB: [birth year], 86 y.o.   MRN: [id number]  HPI Here with daughter for follow up of chronic neuropathic pain from PHN And narcotic dependence This visit occurred during the SARS-CoV-2 public health emergency.  Safety protocols were in place, including screening questions prior to the visit, additional usage of staff PPE, and extensive cleaning of exam room while observing appropriate contact time as indicated for disinfecting solutions.   Pain on right side is worse She did try cutting the topiramate in half after our last visit Ears are itching more Still uses ativan regularly--but only taking at bedtime. Helps her sleep Ongoing dysthymia from the pain  Has not had any recent recurrent vesicles (active zoster) in some time  Current Outpatient Medications on File Prior to Visit  Medication Sig Dispense Refill  . acyclovir (ZOVIRAX) 400 MG tablet TAKE 1 TABLET BY MOUTH 4 TIMES DAILY 120 tablet 5  . fluticasone (FLONASE) 50 MCG/ACT nasal spray PLACE 2 SPRAYS INTO EACH NOSTRIL ONCE DAILY 16 g 11  . LORATADINE PO Take 10 mg by mouth daily.    [name] LORazepam (ATIVAN) 1 MG tablet TAKE 1/2 TO 1 TABLET BY MOUTH TWICE DAILY AS NEEDED FOR ANXIETY 60 tablet 0  . morphine (MSIR) 15 MG tablet TAKE 1 TABLET BY MOUTH EVERY 3 HOURS AS NEEDED FOR PAIN 150 tablet 0  . Multiple Vitamin (MULTIVITAMIN) tablet Take 1 tablet by mouth once daily    . NONFORMULARY OR COMPOUNDED ITEM See pharmacy note 120 each 2  . pantoprazole (PROTONIX) 40 MG tablet TAKE 1 TABLET BY MOUTH TWICE DAILY 60 tablet 11  . polyethylene glycol (MIRALAX / GLYCOLAX) packet Take 17 g by mouth at bedtime.    . RESTASIS 0.05 % ophthalmic emulsion     . topiramate (TOPAMAX) 100 MG tablet Take 0.5-1 tablets (50-100 mg total) by mouth at bedtime. 60 tablet 11   No current facility-administered medications on file prior to visit.    Allergies  Allergen Reactions  . Duloxetine    REACTION: N/V Mental status change and trouble with balance  . Cymbalta [Duloxetine Hcl]     Unsure of reaction  . Maxitrol [Neomycin-Polymyxin-Dexameth]     Unsure of reaction  . Pregabalin     REACTION: SOB, swollen lips  . Tobradex [Tobramycin-Dexamethasone]     Unsure of reaction    Past Medical History:  Diagnosis Date  . Arrhythmia   . Arthritis   . Atypical chest pain    Lexiscan myoview (5/11) with EF 84%, normal wall motion, small fixed apical  perfusion defect likely due to breast attenuation, no evidence for ischemia or infarction.  **Patient had an  . Breast cancer ([place])    Bilateral mastectomies 1986.  [name] CVA (cerebral infarction) 10/12   right lacunar  . Depression   . Dyspnea    Echo (5/11) was a difficult study due to breast implants but showed normal LV and RV size and systolic function.      [name] GERD (gastroesophageal reflux disease)   . Headache    s/p shingles - right side of head  . History of partial thyroidectomy   . Hyperlipidemia   . Hypertension    in past - no current meds/issues  . Obstruction of intestine ([place])   . Partial small bowel obstruction ([place]) 7/14   no surgery  . Wears dentures    full upper  . Zoster  with Post-herpetic neuralgia    Past Surgical History:  Procedure Laterality Date  . ABDOMINAL HYSTERECTOMY    . BREAST SURGERY Bilateral    cancer - mastectomy and implant insertion  . MASTECTOMY  1980   bilateral  . NECK SURGERY    . TARSORRHAPHY Bilateral 08/16/2015   Procedure: MINOR TARSORRAPHY LATERAL PLACEMENT;  Surgeon: Karle Starch, MD;  Location: [facility];  Service: Ophthalmology;  Laterality: Bilateral;  . VAGINAL DELIVERY      Family History  Problem Relation Age of Onset  . Heart disease Son        open heart surgery for a blockage  . Heart Problems Son   . Diabetes Son   . Parkinson's disease Son     Social History   Socioeconomic History  . Marital status: Widowed    Spouse name: Not on  file  . Number of children: 2  . Years of education: Not on file  . Highest education level: Not on file  Occupational History  . Occupation: retired Museum/gallery exhibitions officer: RETIRED  Tobacco Use  . Smoking status: Former Research scientist (life sciences)  . Smokeless tobacco: Never Used  Substance and Sexual Activity  . Alcohol use: No    Alcohol/week: 0.0 standard drinks  . Drug use: No  . Sexual activity: Not on file  Other Topics Concern  . Not on file  Social History Narrative   No living will   No health care POA but requests daughter-in-law [name] to do this   Would like attempts at resuscitation   No feeding tube if cognitively unaware   Social Determinants of Health   Financial Resource Strain:   . Difficulty of Paying Living Expenses: Not on file  Food Insecurity:   . Worried About Charity fundraiser in the Last Year: Not on file  . Ran Out of Food in the Last Year: Not on file  Transportation Needs:   . Lack of Transportation (Medical): Not on file  . Lack of Transportation (Non-Medical): Not on file  Physical Activity:   . Days of Exercise per Week: Not on file  . Minutes of Exercise per Session: Not on file  Stress:   . Feeling of Stress : Not on file  Social Connections:   . Frequency of Communication with Friends and Family: Not on file  . Frequency of Social Gatherings with Friends and Family: Not on file  . Attends Religious Services: Not on file  . Active Member of Clubs or Organizations: Not on file  . Attends Archivist Meetings: Not on file  . Marital Status: Not on file  Intimate Partner Violence:   . Fear of Current or Ex-Partner: Not on file  . Emotionally Abused: Not on file  . Physically Abused: Not on file  . Sexually Abused: Not on file   Review of Systems Daughter giving her high calorie density food Weight up slightly    Objective:   Physical Exam  Constitutional: No distress.  Still with facial wasting  HENT:  No active zoster lesions in V1    Psychiatric: She has a normal mood and affect. Her behavior is normal.           Assessment & Plan:

## 2019-08-17 NOTE — Assessment & Plan Note (Signed)
Reviewed PDMP No concerns 

## 2019-08-17 NOTE — Patient Instructions (Signed)
Please go back to a full topiramate pill every night. Only take the acyclovir if you get another breakout of the shingles back on your head.

## 2019-08-17 NOTE — Assessment & Plan Note (Signed)
Severe and unrelenting Will try off the acyclovir (unless she gets recurrent lesions) Has failed the decreased topiramate---pain clearly worse. Will go back to the full 100mg  at bedtime Discussed tachyphylaxis with the morphine--but she generally only takes tid

## 2019-08-18 ENCOUNTER — Ambulatory Visit: Payer: PPO

## 2019-08-18 DIAGNOSIS — E538 Deficiency of other specified B group vitamins: Secondary | ICD-10-CM

## 2019-08-18 DIAGNOSIS — I10 Essential (primary) hypertension: Secondary | ICD-10-CM

## 2019-08-18 DIAGNOSIS — I739 Peripheral vascular disease, unspecified: Secondary | ICD-10-CM

## 2019-08-18 DIAGNOSIS — F39 Unspecified mood [affective] disorder: Secondary | ICD-10-CM

## 2019-08-18 DIAGNOSIS — B0229 Other postherpetic nervous system involvement: Secondary | ICD-10-CM

## 2019-08-18 DIAGNOSIS — K2100 Gastro-esophageal reflux disease with esophagitis, without bleeding: Secondary | ICD-10-CM

## 2019-08-18 DIAGNOSIS — J301 Allergic rhinitis due to pollen: Secondary | ICD-10-CM

## 2019-08-18 DIAGNOSIS — G629 Polyneuropathy, unspecified: Secondary | ICD-10-CM

## 2019-08-18 NOTE — Chronic Care Management (AMB) (Signed)
Chronic Care Management Pharmacy  Name: [name]  MRN: [id number] DOB: 22-Nov-1927  Chief Complaint/ HPI  [name],  86 y.o., female presents for their Initial CCM visit with the clinical pharmacist via telephone. Conducted visit with patient's daughter, [name], who was with patient during the call and manages her medications.  PCP : Elizabeth Carbon, MD  Their chronic conditions include: HTN, allergic rhinitis, PAD, GERD, OA, B12 deficiency, HLD, postherpetic neuralgia  Patient concerns: would like to know what she can take for breakthrough pain; would like a recommendation for itchy scalp, concerned about weight loss with topiramate   Office Visits:   08/17/19: [name], 2 month narcotic follow up - increase to full tablet of topiramate every night for postherpetic neuralgia, only take acyclovir if shingles breakout  04/27/19: [name], AWV - recheck CBC for thrombocytopenia, discontinue aspirin, try to decrease topiramate for neuralgia  Consult Visit:  03/03/19: [name], podiatry - rx for neuropathic pain cream, recommended compression hose daily for edema  Allergies  Allergen Reactions  . Duloxetine     REACTION: N/V Mental status change and trouble with balance  . Cymbalta [Duloxetine Hcl]     Unsure of reaction  . Maxitrol [Neomycin-Polymyxin-Dexameth]     Unsure of reaction  . Pregabalin     REACTION: SOB, swollen lips  . Tobradex [Tobramycin-Dexamethasone]     Unsure of reaction   Medications: Outpatient Encounter Medications as of 08/18/2019  Medication Sig  . acyclovir (ZOVIRAX) 400 MG tablet Take 1 tablet (400 mg total) by mouth 4 (four) times daily. As needed if shingles flares up  . fluticasone (FLONASE) 50 MCG/ACT nasal spray PLACE 2 SPRAYS INTO EACH NOSTRIL ONCE DAILY  . LORATADINE PO Take 10 mg by mouth daily.  [name] LORazepam (ATIVAN) 1 MG tablet TAKE 1/2 TO 1 TABLET BY MOUTH TWICE DAILY AS NEEDED FOR ANXIETY  . morphine (MSIR) 15 MG tablet TAKE 1  TABLET BY MOUTH EVERY 3 HOURS AS NEEDED FOR PAIN  . Multiple Vitamin (MULTIVITAMIN) tablet Take 1 tablet by mouth once daily  . NONFORMULARY OR COMPOUNDED ITEM See pharmacy note  . pantoprazole (PROTONIX) 40 MG tablet TAKE 1 TABLET BY MOUTH TWICE DAILY  . polyethylene glycol (MIRALAX / GLYCOLAX) packet Take 17 g by mouth at bedtime.  . RESTASIS 0.05 % ophthalmic emulsion   . topiramate (TOPAMAX) 100 MG tablet Take 0.5-1 tablets (50-100 mg total) by mouth at bedtime.   No facility-administered encounter medications on file as of 08/18/2019.   Current Diagnosis/Assessment: Goals    . Pharmacy Care Plan     Current Barriers:  . Chronic Disease Management support, education, and care coordination needs related to hypertension allergic rhinitis, peripheral artery disease, GERD, osteoarthritis, B12 deficiency, hyperlipidemia, postherpetic neuralgia  Pharmacist Clinical Goal(s):  [name] Achieve pain control and limit breakthrough pain. Try Tylenol (May take two 325 mg tablets every 4-6 hours or one 500 mg tablet every 4-6 hours.) in between morphine for breakthrough pain. . Achieve itch relief of scalp. Avoid scented shampoos and hair products. Try topical lidocaine over the counter for itching of scalp.    Interventions: . Comprehensive medication review performed. . Provide patient counseling on medications . Updated medication list . Referred for pharmacy delivery and packaging services  Patient Self Care Activities:  . Takes medications as prescribed  Initial goal documentation        Hypertension/PAD   Office blood pressures are  BP Readings from Last 3 Encounters:  08/17/19 108/60  04/27/19 120/80  07/21/18 110/70   CMP Latest Ref Rng & Units 04/27/2019 02/10/2018 01/27/2018  Glucose 70 - 99 mg/dL 105(H) 162(H) 104(H)  BUN 6 - 23 mg/dL 23 35(H) 27  Creatinine 0.40 - 1.20 mg/dL 0.84 1.10(H) 1.05(H)  Sodium 135 - 145 mEq/L 142 137 141  Potassium 3.5 - 5.1 mEq/L 4.7 3.8 4.4    Chloride 96 - 112 mEq/L 108 100 101  CO2 19 - 32 mEq/L 27 27 22   Calcium 8.4 - 10.5 mg/dL 9.3 8.9 9.0  Total Protein 6.0 - 8.3 g/dL 7.0 - -  Total Bilirubin 0.2 - 1.2 mg/dL 0.4 - -  Alkaline Phos 39 - 117 U/L 55 - -  AST 0 - 37 U/L 20 - -  ALT 0 - 35 U/L 13 - -   Patient has failed these meds in the past: none reported Patient checks BP at home: none Patient is currently controlled on the following medications:   No pharmacotherapy We discussed: patient discontinued aspirin per PCP due to thrombocytopenia  Plan: Continue control with diet and exercise   Pain/Post-Herpetic Neuralgia  Site: history of shingles right side of head, non-active  Appetite: poor Pain level: mild-moderate per patient, scalp itches on right side Patient has failed these meds in past: pregablin, neuropathic pain cream - Apply 1-2 grams 3-4 times daily (never picked up due to cost), amitriptyline (dry mouth per progress note 2008), nortriptyline (unknown) Patient is currently uncontrolled on the following medications:   Morphine 15 mg - 1 tablet every 3 hours as needed for pain   Topiramate 100 mg - 1 tablet daily at bedtime  Acylovir 400 mg - 1 tablet four times daily as needed for breakouts (none currently)  We discussed: Pt concerned about weight loss with increased topiramate dosing. Patient is currently taking morphine 1 tablet at breakfast, 1 PM, and 5 PM for pain in addition to topiramate. Topiramate recently increased back to 1 full tablet on 08/17/19. Reports some breakthrough pain, but does not want to increase morphine dosing.  Limited alternatives available for PHN as patient cannot tolerate pregabalin/gabapentin and has tried amitriptyline and nortriptyline (unsure if tolerated). May take Tylenol as needed (two 325 mg tablets every 4-6 hours or one 500 mg tablet every 4-6 hours). Will try increasing tylenol to achieve pain control prior to considering alternatives to topiramate. Patient also would  like something for itchy scalp. TCA may help with itching and pain. Alternatives include capsaicin cream and topical lidocaine. Recommend trial of topical lidocaine (OTC) to scalp for itching.  Plan: Continue current medications; Trial Tylenol in between morphine for breakthrough pain and topical lidocaine cream, gel or patch (OTC) to scalp for itching.  Mood Disorder  Patient has failed these meds in past: none reported Patient is currently controlled on the following medications:   Lorazepam 1 mg - 1/2-1 tablet by mouth twice daily as needed for anxiety We discussed: only takes at bedtime Plan: Continue current medications  GERD  Patient has failed these meds in past: none reported Patient is currently controlled on the following medications:   Pantoprazole 40 mg - 1 tablet twice daily  Plan: Continue current medications  Allergic Rhinitis  Patient has failed these meds in past: Patient is currently controlled on the following medications:   Loratadine 10 mg - 1 tablet daily   Flonase 50 mcg - 2 sprays in each nostril daily  Plan: Continue current medications  Medication Management  Misc: Restasis 0.05% - continues 3-5 times  a day in both eyes (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 A medication sync plan was created to allow patient to get all medications delivered once every 30 to 90 days per patient  preference. Patient understands they have freedom to choose pharmacy and clinical pharmacist will coordinate care between all prescribers and [facility].  Debbora Dus, PharmD Clinical Pharmacist [facility] at [facility] [phone]

## 2019-08-20 ENCOUNTER — Other Ambulatory Visit: Payer: Self-pay

## 2019-08-23 NOTE — Patient Instructions (Addendum)
Dear [name],  It was a pleasure meeting you during our initial appointment on August 18, 2019. Below is a summary of the goals we discussed and components of chronic care management. Please contact me anytime with questions or concerns.   Visit Information  Goals Addressed            This Visit's Progress   . Pharmacy Care Plan       Current Barriers:  . Chronic Disease Management support, education, and care coordination needs related to hypertension allergic rhinitis, peripheral artery disease, GERD, osteoarthritis, B12 deficiency, hyperlipidemia, postherpetic neuralgia  Pharmacist Clinical Goal(s):  [name] Achieve pain control and limit breakthrough pain. Try Tylenol (May take two 325 mg tablets every 4-6 hours or one 500 mg tablet every 4-6 hours.) in between morphine for breakthrough pain. . Achieve itch relief of scalp. Avoid scented shampoos and hair products. Try topical lidocaine over the counter for itching of scalp.    Interventions: . Comprehensive medication review performed. . Provide patient counseling on medications . Updated medication list . Referred for pharmacy delivery and packaging services  Patient Self Care Activities:  . Takes medications as prescribed  Initial goal documentation        Ms. Ehrhard was given information about Chronic Care Management services today including:  1. CCM service includes personalized support from designated clinical staff supervised by her physician, including individualized plan of care and coordination with other care providers 2. 24/7 contact phone numbers for assistance for urgent and routine care needs. 3. Service will only be billed when office clinical staff spend 20 minutes or more in a month to coordinate care. 4. Only one practitioner may furnish and bill the service in a calendar month. 5. The patient may stop CCM services at any time (effective at the end of the month) by phone call to the office  staff. 6. The patient will be responsible for cost sharing (co-pay) of up to 20% of the service fee (after annual deductible is met).  Patient agreed to services and verbal consent obtained.   Verbal consent obtained for [facility] enhanced pharmacy services (medication synchronization, adherence packaging, delivery coordination). A medication sync plan was created to allow patient to get all medications delivered once every 30 to 90 days per patient preference. Patient understands they have freedom to choose pharmacy and clinical pharmacist will coordinate care between all prescribers and [facility].  The pharmacy team will reach out to the patient again over the next 14 days.    Sincerely,  Debbora Dus, PharmD Clinical Pharmacist [facility] at [facility] [phone]

## 2019-08-24 ENCOUNTER — Other Ambulatory Visit: Payer: Self-pay | Admitting: Internal Medicine

## 2019-08-24 ENCOUNTER — Telehealth: Payer: Self-pay

## 2019-08-24 DIAGNOSIS — I739 Peripheral vascular disease, unspecified: Secondary | ICD-10-CM

## 2019-08-24 DIAGNOSIS — I10 Essential (primary) hypertension: Secondary | ICD-10-CM

## 2019-08-24 DIAGNOSIS — H5713 Ocular pain, bilateral: Secondary | ICD-10-CM | POA: Diagnosis not present

## 2019-08-24 NOTE — Telephone Encounter (Signed)
I would like to request a referral for [name] to chronic care management pharmacy services focusing on the following conditions:   Essential hypertension, benign  [I10]  PAD (peripheral artery disease) ([place]) [I73.9]  Debbora Dus, PharmD Clinical Pharmacist [facility] at [facility] [phone]

## 2019-08-24 NOTE — Telephone Encounter (Signed)
Name of Medication: Morphine Name of Pharmacy: [facility] or Written Date and Quantity: 07-13-19 #150 Last Office Visit and Type: 08-18-19 Next Office Visit and Type: 11-16-19 Last Controlled Substance Agreement Date: 04-27-19 Last UDS: 04-27-19

## 2019-09-01 DIAGNOSIS — L821 Other seborrheic keratosis: Secondary | ICD-10-CM | POA: Diagnosis not present

## 2019-09-01 DIAGNOSIS — L97829 Non-pressure chronic ulcer of other part of left lower leg with unspecified severity: Secondary | ICD-10-CM | POA: Diagnosis not present

## 2019-09-01 DIAGNOSIS — Z872 Personal history of diseases of the skin and subcutaneous tissue: Secondary | ICD-10-CM | POA: Diagnosis not present

## 2019-09-01 DIAGNOSIS — Z85828 Personal history of other malignant neoplasm of skin: Secondary | ICD-10-CM | POA: Diagnosis not present

## 2019-09-02 DIAGNOSIS — E11621 Type 2 diabetes mellitus with foot ulcer: Secondary | ICD-10-CM | POA: Diagnosis not present

## 2019-09-08 DIAGNOSIS — Z85828 Personal history of other malignant neoplasm of skin: Secondary | ICD-10-CM | POA: Diagnosis not present

## 2019-09-08 DIAGNOSIS — L97819 Non-pressure chronic ulcer of other part of right lower leg with unspecified severity: Secondary | ICD-10-CM | POA: Diagnosis not present

## 2019-09-14 ENCOUNTER — Other Ambulatory Visit: Payer: Self-pay | Admitting: Internal Medicine

## 2019-09-14 NOTE — Telephone Encounter (Signed)
Last filled 07-20-19 #60 Last OV 08-17-19 Next OV 11-16-19 [place]

## 2019-09-22 ENCOUNTER — Ambulatory Visit: Payer: PPO | Admitting: Dermatology

## 2019-09-22 ENCOUNTER — Other Ambulatory Visit: Payer: Self-pay

## 2019-09-22 DIAGNOSIS — L97811 Non-pressure chronic ulcer of other part of right lower leg limited to breakdown of skin: Secondary | ICD-10-CM

## 2019-09-22 DIAGNOSIS — L98491 Non-pressure chronic ulcer of skin of other sites limited to breakdown of skin: Secondary | ICD-10-CM

## 2019-09-22 NOTE — Progress Notes (Signed)
   Follow-Up Visit   Subjective  [name] is a 86 y.o. female who presents for the following: Follow-up (2 weeks follow up on a ulcer on the R pretibial ).  The following portions of the chart were reviewed this encounter and updated as appropriate:     Review of Systems: No other skin or systemic complaints.  Objective  Well appearing patient in no apparent distress; mood and affect are within normal limits.  A focused examination was performed including R pretibial . Relevant physical exam findings are noted in the Assessment and Plan.  Objective  Right pretibial: 0.8 x 0.6cm ulcer improving   Assessment & Plan   Continue cleansing skin daily with Puracyn Apply Mupirocin ointment and non stick bandage.  May also substitute duoderm for bandage.  May change dressings every other day.  Non-pressure chronic ulcer of skin of other sites limited to breakdown of skin ([place]) Right pretibial  Return in about 4 weeks (around 10/20/2019) for ulcer .

## 2019-09-25 ENCOUNTER — Ambulatory Visit: Payer: Self-pay

## 2019-09-25 DIAGNOSIS — B0229 Other postherpetic nervous system involvement: Secondary | ICD-10-CM

## 2019-09-25 DIAGNOSIS — I10 Essential (primary) hypertension: Secondary | ICD-10-CM

## 2019-09-25 DIAGNOSIS — I739 Peripheral vascular disease, unspecified: Secondary | ICD-10-CM

## 2019-09-25 NOTE — Progress Notes (Deleted)
Chronic Care Management Pharmacy  Name: [name]  MRN: [id number] DOB: 1927-07-11  Chief Complaint/ HPI  [name],  86 y.o. , female presents for their Follow-Up CCM visit with the clinical pharmacist via telephone. Conducted visit with patient's daughter, [name], who was with patient during the call and manages her medications.  PCP : Venia Carbon, MD  Their chronic conditions include: HTN, allergic rhinitis, PAD, GERD, OA, B12 deficiency, HLD, postherpetic neuralgia  Since previous CCM visit:  Office visit: 09/22/19 dermatology - chronic ulcer of skin, apply mupirocin ointment, clean daily   Medications: Outpatient Encounter Medications as of 09/25/2019  Medication Sig  . acyclovir (ZOVIRAX) 400 MG tablet Take 1 tablet (400 mg total) by mouth 4 (four) times daily. As needed if shingles flares up (Patient not taking: Reported on 08/23/2019)  . fluticasone (FLONASE) 50 MCG/ACT nasal spray PLACE 2 SPRAYS INTO EACH NOSTRIL ONCE DAILY  . LORATADINE PO Take 10 mg by mouth daily.  [name] LORazepam (ATIVAN) 1 MG tablet TAKE 1/2 TO 1 TABLET BY MOUTH TWICE DAILY AS NEEDED FOR ANXIETY  . morphine (MSIR) 15 MG tablet TAKE 1 TABLET BY MOUTH EVERY 3 HOURS AS NEEDED FOR PAIN  . Multiple Vitamin (MULTIVITAMIN) tablet Take 1 tablet by mouth once daily  . NONFORMULARY OR COMPOUNDED ITEM See pharmacy note (Patient not taking: Reported on 08/23/2019)  . pantoprazole (PROTONIX) 40 MG tablet TAKE 1 TABLET BY MOUTH TWICE DAILY  . polyethylene glycol (MIRALAX / GLYCOLAX) packet Take 17 g by mouth at bedtime.  . RESTASIS 0.05 % ophthalmic emulsion   . topiramate (TOPAMAX) 100 MG tablet Take 0.5-1 tablets (50-100 mg total) by mouth at bedtime.   No facility-administered encounter medications on file as of 09/25/2019.   Current Diagnosis/Assessment:   Hypertension/PAD   Office blood pressures are     BP Readings from Last 3 Encounters:  08/17/19 108/60  04/27/19 120/80  07/21/18 110/70    Patient has failed these meds in the past: none reported Patient checks BP at home: none Patient is currently controlled on the following medications:   No pharmacotherapy We discussed: patient discontinued aspirin per PCP due to thrombocytopenia  Plan: Continue control with diet and exercise   Pain/Post-Herpetic Neuralgia  Site: history of shingles right side of head, non-active  Appetite: poor Pain level: mild-moderate per patient, improved since last visit  Patient has failed these meds in past: pregablin, neuropathic pain cream - Apply 1-2 grams 3-4 times daily (never picked up due to cost), amitriptyline (dry mouth per progress note 2008), nortriptyline (unknown) Patient is currently uncontrolled on the following medications:   Morphine 15 mg - 1 tablet every 3 hours as needed for pain   Topiramate 100 mg - 1/2 tablet daily at bedtime  Acylovir 400 mg - 1 tablet four times daily as needed for breakouts (none currently)  We discussed: Patient is currently taking morphine 1 tablet at breakfast, 1 PM, and 5 PM for pain in addition to topiramate. Itchy scalp much improved with shampoo from dermatologist. Pain has improved as well and not needing additional Tylenol. Has reduce topiramate back to 1/2 tablet and doing well.   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 If needing more assistance in the future, will call.  Adherence: no concerns  Social support: daughter helps with medications [name] (Business Cell: [phone]; Home: [phone])  Affordability: no concerns  CCM Follow Up: 11/16/19 at 2:30 PM (telephone)  Debbora Dus,  PharmD Clinical Pharmacist [facility] at [facility] [phone]

## 2019-09-25 NOTE — Chronic Care Management (AMB) (Signed)
Chronic Care Management Pharmacy  Name: [name]  MRN: [id number] DOB: 1928-06-05  Chief Complaint/ HPI  [name],  86 y.o. , female presents for their Follow-Up CCM visit with the clinical pharmacist via telephone. Conducted visit with patient's daughter, [name], who was with patient during the call and manages her medications.  PCP : Venia Carbon, MD  Their chronic conditions include: HTN, allergic rhinitis, PAD, GERD, OA, B12 deficiency, HLD, postherpetic neuralgia  Since previous CCM visit:  Office visit: 09/22/19 dermatology - chronic ulcer of skin, apply mupirocin ointment, clean daily   Medications: Outpatient Encounter Medications as of 09/25/2019  Medication Sig  . acyclovir (ZOVIRAX) 400 MG tablet Take 1 tablet (400 mg total) by mouth 4 (four) times daily. As needed if shingles flares up (Patient not taking: Reported on 08/23/2019)  . fluticasone (FLONASE) 50 MCG/ACT nasal spray PLACE 2 SPRAYS INTO EACH NOSTRIL ONCE DAILY  . LORATADINE PO Take 10 mg by mouth daily.  [name] LORazepam (ATIVAN) 1 MG tablet TAKE 1/2 TO 1 TABLET BY MOUTH TWICE DAILY AS NEEDED FOR ANXIETY  . morphine (MSIR) 15 MG tablet TAKE 1 TABLET BY MOUTH EVERY 3 HOURS AS NEEDED FOR PAIN  . Multiple Vitamin (MULTIVITAMIN) tablet Take 1 tablet by mouth once daily  . NONFORMULARY OR COMPOUNDED ITEM See pharmacy note (Patient not taking: Reported on 08/23/2019)  . pantoprazole (PROTONIX) 40 MG tablet TAKE 1 TABLET BY MOUTH TWICE DAILY  . polyethylene glycol (MIRALAX / GLYCOLAX) packet Take 17 g by mouth at bedtime.  . RESTASIS 0.05 % ophthalmic emulsion   . topiramate (TOPAMAX) 100 MG tablet Take 0.5-1 tablets (50-100 mg total) by mouth at bedtime.   No facility-administered encounter medications on file as of 09/25/2019.   Current Diagnosis/Assessment:   Hypertension/PAD  Office blood pressures are     BP Readings from Last 3 Encounters:  08/17/19 108/60  04/27/19 120/80  07/21/18 110/70    Patient has failed these meds in the past: none reported Patient checks BP at home: none Patient is currently controlled on the following medications:   No pharmacotherapy We discussed: patient remains off aspirin per PCP due to thrombocytopenia  Plan: Continue control with diet and exercise   Pain/Post-Herpetic Neuralgia    Site: history of shingles right side of head, non-active  Appetite: poor Pain level: mild-moderate per patient, improved since last visit  Patient has failed these meds in past: pregablin, neuropathic pain cream - Apply 1-2 grams 3-4 times daily (never picked up due to cost), amitriptyline (dry mouth per progress note 2008), nortriptyline (unknown) Patient is currently uncontrolled on the following medications:   Morphine 15 mg - 1 tablet every 3 hours as needed for pain   Topiramate 100 mg - 1/2 tablet daily at bedtime  Acylovir 400 mg - 1 tablet four times daily as needed for breakouts (none currently)  We discussed: Patient is currently taking morphine 1 tablet at breakfast, 1 PM, and 5 PM for pain in addition to topiramate. Itchy scalp much improved with shampoo from dermatologist. Pain has improved as well and not needing additional Tylenol. Has reduce topiramate back to 1/2 tablet and doing well.   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 If needing more assistance in the future, will contact pharmacist.   Adherence: no concerns  Social support: daughter helps with medications [name] (Business Cell: [phone]; Home: [phone])  Affordability: no concerns  CCM Follow Up: 11/16/19 at 2:30 PM (telephone)  Debbora Dus, PharmD Clinical Pharmacist [facility] at [facility] [phone]

## 2019-10-06 DIAGNOSIS — H16231 Neurotrophic keratoconjunctivitis, right eye: Secondary | ICD-10-CM | POA: Diagnosis not present

## 2019-10-19 ENCOUNTER — Other Ambulatory Visit: Payer: Self-pay | Admitting: Internal Medicine

## 2019-10-19 NOTE — Telephone Encounter (Signed)
Name of Medication: Morphine Name of Pharmacy: [facility] or Written Date and Quantity: 08-24-19 #150 Last Office Visit and Type: 08-18-19 Next Office Visit and Type: 11-16-19 Last Controlled Substance Agreement Date: 04-27-19 Last UDS: 04-27-19

## 2019-10-20 ENCOUNTER — Ambulatory Visit: Payer: PPO | Admitting: Dermatology

## 2019-10-20 ENCOUNTER — Encounter: Payer: Self-pay | Admitting: Dermatology

## 2019-10-20 ENCOUNTER — Other Ambulatory Visit: Payer: Self-pay

## 2019-10-20 DIAGNOSIS — L97811 Non-pressure chronic ulcer of other part of right lower leg limited to breakdown of skin: Secondary | ICD-10-CM | POA: Diagnosis not present

## 2019-10-20 DIAGNOSIS — L821 Other seborrheic keratosis: Secondary | ICD-10-CM | POA: Diagnosis not present

## 2019-10-20 DIAGNOSIS — Z85828 Personal history of other malignant neoplasm of skin: Secondary | ICD-10-CM | POA: Diagnosis not present

## 2019-10-20 DIAGNOSIS — I83018 Varicose veins of right lower extremity with ulcer other part of lower leg: Secondary | ICD-10-CM

## 2019-10-20 NOTE — Progress Notes (Signed)
   Follow-Up Visit   Subjective  [name] is a 86 y.o. female who presents for the following: ulcer (R pretibial 38m f/u, Mupirocin and duoderm qod, improving per care giver).   The following portions of the chart were reviewed this encounter and updated as appropriate: Tobacco  Allergies  Meds  Problems  Med Hx  Surg Hx  Fam Hx      Review of Systems: No other skin or systemic complaints.  Objective  Well appearing patient in no apparent distress; mood and affect are within normal limits.  A focused examination was performed including R leg. Relevant physical exam findings are noted in the Assessment and Plan.  Objective  R pretibial: Well healed scar with no evidence of recurrence.   Objective  R pretibial: R pretibial clear, ulcer resolved  Assessment & Plan   Seborrheic Keratoses - Stuck-on, waxy, tan-brown papules and plaques  - Discussed benign etiology and prognosis. - Observe - Call for any changes     History of basal cell carcinoma (BCC) R pretibial  Clear. Observe for recurrence. Call clinic for new or changing lesions.  Recommend regular skin exams, daily broad-spectrum spf 30+ sunscreen use, and photoprotection.     Venous stasis ulcer of other part of right lower leg limited to breakdown of skin, unspecified whether varicose veins present (HCC) R pretibial  Hx Ulcer 2ndary to EDC of BCC R pretibial resolved.  No further txt at this time.  Return in about 6 months (around 04/20/2020) for TBSE.   Documentation: I have reviewed the above documentation for accuracy and completeness, and I agree with the above.  Sarina Ser, MD

## 2019-11-16 ENCOUNTER — Other Ambulatory Visit: Payer: Self-pay

## 2019-11-16 ENCOUNTER — Ambulatory Visit (INDEPENDENT_AMBULATORY_CARE_PROVIDER_SITE_OTHER): Payer: PPO | Admitting: Internal Medicine

## 2019-11-16 ENCOUNTER — Encounter: Payer: Self-pay | Admitting: Internal Medicine

## 2019-11-16 DIAGNOSIS — F1120 Opioid dependence, uncomplicated: Secondary | ICD-10-CM

## 2019-11-16 DIAGNOSIS — B0229 Other postherpetic nervous system involvement: Secondary | ICD-10-CM | POA: Diagnosis not present

## 2019-11-16 MED ORDER — PANTOPRAZOLE SODIUM 40 MG PO TBEC: 40.0000 mg | DELAYED_RELEASE_TABLET | Freq: Two times a day (BID) | ORAL | 3 refills | Status: DC

## 2019-11-16 MED ORDER — HYDROCORTISONE 2.5 % EX CREA
TOPICAL_CREAM | Freq: Three times a day (TID) | CUTANEOUS | 3 refills | Status: DC | PRN
Start: 2019-11-16 — End: 2021-08-17

## 2019-11-16 NOTE — Assessment & Plan Note (Signed)
PDMP reviewed No concerns 

## 2019-11-16 NOTE — Assessment & Plan Note (Signed)
Severe and unremitting Uses the morphine tid regularly No recurrence of shingles lately---using acyclovir only daily now (as preventative) 1/2 topiramate still --appetite is some better

## 2019-11-16 NOTE — Progress Notes (Signed)
Subjective:    Patient ID: [name], female    DOB: June 25, 1928, 86 y.o.   MRN: [id number]  HPI Here with daughter for follow up of chronic pain This visit occurred during the SARS-CoV-2 public health emergency.  Safety protocols were in place, including screening questions prior to the visit, additional usage of staff PPE, and extensive cleaning of exam room while observing appropriate contact time as indicated for disinfecting solutions.   Appetite is better now Weight still not up here--but is at home  She feels the pain is some better Only taking the 1/2  Still uses the morphine tid mostly Has cut the acyclovir down to daily  No recent lesions on head  Current Outpatient Medications on File Prior to Visit  Medication Sig Dispense Refill  . acyclovir (ZOVIRAX) 400 MG tablet Take 1 tablet (400 mg total) by mouth 4 (four) times daily. As needed if shingles flares up 1 tablet 0  . fluticasone (FLONASE) 50 MCG/ACT nasal spray PLACE 2 SPRAYS INTO EACH NOSTRIL ONCE DAILY 16 g 11  . LORazepam (ATIVAN) 1 MG tablet TAKE 1/2 TO 1 TABLET BY MOUTH TWICE DAILY AS NEEDED FOR ANXIETY (Patient taking differently: 1/2 tablet every night) 60 tablet 0  . morphine (MSIR) 15 MG tablet TAKE 1 TABLET BY MOUTH EVERY 3 HOURS AS NEEDED FOR PAIN 150 tablet 0  . Multiple Vitamin (MULTIVITAMIN) tablet Take 1 tablet by mouth once daily    . NONFORMULARY OR COMPOUNDED ITEM See pharmacy note 120 each 2  . pantoprazole (PROTONIX) 40 MG tablet TAKE 1 TABLET BY MOUTH TWICE DAILY 60 tablet 11  . polyethylene glycol (MIRALAX / GLYCOLAX) packet Take 17 g by mouth at bedtime.    . RESTASIS 0.05 % ophthalmic emulsion     . topiramate (TOPAMAX) 100 MG tablet Take 0.5-1 tablets (50-100 mg total) by mouth at bedtime. (Patient taking differently: Take 50-100 mg by mouth at bedtime. 1/2 tablet at bedtime) 60 tablet 11   No current facility-administered medications on file prior to visit.    Allergies  Allergen  Reactions  . Duloxetine     REACTION: N/V Mental status change and trouble with balance  . Cymbalta [Duloxetine Hcl]     Unsure of reaction  . Maxitrol [Neomycin-Polymyxin-Dexameth]     Unsure of reaction  . Pregabalin     REACTION: SOB, swollen lips  . Tobradex [Tobramycin-Dexamethasone]     Unsure of reaction    Past Medical History:  Diagnosis Date  . Arrhythmia   . Arthritis   . Atypical chest pain    Lexiscan myoview (5/11) with EF 84%, normal wall motion, small fixed apical  perfusion defect likely due to breast attenuation, no evidence for ischemia or infarction.  **Patient had an  . Basal cell carcinoma 07/28/19 EDC   R pretibial  . Breast cancer ([place])    Bilateral mastectomies 1986.  [name] CVA (cerebral infarction) 10/12   right lacunar  . Depression   . Dyspnea    Echo (5/11) was a difficult study due to breast implants but showed normal LV and RV size and systolic function.      [name] GERD (gastroesophageal reflux disease)   . Headache    s/p shingles - right side of head  . History of partial thyroidectomy   . Hyperlipidemia   . Hypertension    in past - no current meds/issues  . Obstruction of intestine ([place])   . Partial small bowel obstruction ([place]) 7/14  no surgery  . Wears dentures    full upper  . Zoster     with Post-herpetic neuralgia    Past Surgical History:  Procedure Laterality Date  . ABDOMINAL HYSTERECTOMY    . BREAST SURGERY Bilateral    cancer - mastectomy and implant insertion  . MASTECTOMY  1980   bilateral  . NECK SURGERY    . TARSORRHAPHY Bilateral 08/16/2015   Procedure: MINOR TARSORRAPHY LATERAL PLACEMENT;  Surgeon: Karle Starch, MD;  Location: [facility];  Service: Ophthalmology;  Laterality: Bilateral;  . VAGINAL DELIVERY      Family History  Problem Relation Age of Onset  . Heart disease Son        open heart surgery for a blockage  . Heart Problems Son   . Diabetes Son   . Parkinson's disease Son     Social History    Socioeconomic History  . Marital status: Widowed    Spouse name: Not on file  . Number of children: 2  . Years of education: Not on file  . Highest education level: Not on file  Occupational History  . Occupation: retired Museum/gallery exhibitions officer: RETIRED  Tobacco Use  . Smoking status: Former Research scientist (life sciences)  . Smokeless tobacco: Never Used  Substance and Sexual Activity  . Alcohol use: No    Alcohol/week: 0.0 standard drinks  . Drug use: No  . Sexual activity: Not on file  Other Topics Concern  . Not on file  Social History Narrative   No living will   No health care POA but requests daughter-in-law [name] to do this   Would like attempts at resuscitation   No feeding tube if cognitively unaware   Social Determinants of Health   Financial Resource Strain:   . Difficulty of Paying Living Expenses:   Food Insecurity:   . Worried About Charity fundraiser in the Last Year:   . Arboriculturist in the Last Year:   Transportation Needs:   . Film/video editor (Medical):   [name] Lack of Transportation (Non-Medical):   Physical Activity:   . Days of Exercise per Week:   . Minutes of Exercise per Session:   Stress:   . Feeling of Stress :   Social Connections:   . Frequency of Communication with Friends and Family:   . Frequency of Social Gatherings with Friends and Family:   . Attends Religious Services:   . Active Member of Clubs or Organizations:   . Attends Archivist Meetings:   [name] Marital Status:   Intimate Partner Violence:   . Fear of Current or Ex-Partner:   . Emotionally Abused:   [name] Physically Abused:   . Sexually Abused:    Review of Systems Lots of itching in ears--both sides Sleeping okay Has lots of drainage every morning---back of throat. Fexofenadine has helped some with this  Is doing the flonase  Daughter wants to try new shampoo for the itching    Objective:   Physical Exam  Constitutional: No distress.  HENT:  No sig scaling in  scalp---but slight drying inside pinnae           Assessment & Plan:

## 2019-11-17 MED ORDER — LORAZEPAM 1 MG PO TABS
ORAL_TABLET | ORAL | 0 refills | Status: DC
Start: 2019-11-17 — End: 2020-01-12

## 2019-11-23 DIAGNOSIS — H16231 Neurotrophic keratoconjunctivitis, right eye: Secondary | ICD-10-CM | POA: Diagnosis not present

## 2019-12-02 ENCOUNTER — Other Ambulatory Visit: Payer: Self-pay | Admitting: Internal Medicine

## 2019-12-03 NOTE — Telephone Encounter (Signed)
Name of [place] Name [facility] [place] or Written Date and Quantity:10-19-19 #150 Last Office Visit and Type:11-16-19 Next Office Visit and Type:02-22-20 Last Controlled Substance Agreement Date:04-27-19 Last UDS:04-27-19

## 2019-12-23 DIAGNOSIS — H16231 Neurotrophic keratoconjunctivitis, right eye: Secondary | ICD-10-CM | POA: Diagnosis not present

## 2020-01-04 DIAGNOSIS — H6121 Impacted cerumen, right ear: Secondary | ICD-10-CM | POA: Diagnosis not present

## 2020-01-04 DIAGNOSIS — J301 Allergic rhinitis due to pollen: Secondary | ICD-10-CM | POA: Diagnosis not present

## 2020-01-04 DIAGNOSIS — J019 Acute sinusitis, unspecified: Secondary | ICD-10-CM | POA: Diagnosis not present

## 2020-01-11 ENCOUNTER — Other Ambulatory Visit: Payer: Self-pay | Admitting: Internal Medicine

## 2020-01-12 NOTE — Telephone Encounter (Signed)
Last filled 11-17-19 #60 Last OV 11-16-19 Next OV 02-22-20 [place]

## 2020-01-22 ENCOUNTER — Other Ambulatory Visit: Payer: Self-pay

## 2020-01-22 ENCOUNTER — Telehealth: Payer: Self-pay

## 2020-01-22 ENCOUNTER — Ambulatory Visit
Admission: EM | Admit: 2020-01-22 | Discharge: 2020-01-22 | Disposition: A | Discharge status: Home / Self Care | Payer: PPO | Attending: Family Medicine | Admitting: Family Medicine

## 2020-01-22 DIAGNOSIS — J0100 Acute maxillary sinusitis, unspecified: Secondary | ICD-10-CM | POA: Diagnosis not present

## 2020-01-22 DIAGNOSIS — R0981 Nasal congestion: Secondary | ICD-10-CM

## 2020-01-22 DIAGNOSIS — Z7689 Persons encountering health services in other specified circumstances: Secondary | ICD-10-CM | POA: Diagnosis not present

## 2020-01-22 DIAGNOSIS — J3489 Other specified disorders of nose and nasal sinuses: Secondary | ICD-10-CM

## 2020-01-22 MED ORDER — AMOXICILLIN-POT CLAVULANATE 875-125 MG PO TABS: 1.0000 | ORAL_TABLET | Freq: Two times a day (BID) | ORAL | 0 refills | Status: AC

## 2020-01-22 NOTE — Telephone Encounter (Signed)
[place] - Client TELEPHONE ADVICE RECORD Access[facility]urse Patient Name: [name] Gender: Female DOB: 12/27/1927 Age: 86 Y 42 M Return Phone Number: [phone] (Primary) Address: City/State/Zip: [place] [address] Client [name] - Client Client Site [place] - Day Physician Viviana Simpler- MD Contact Type Call Who Is Calling Patient / Member / Family / Caregiver Call Type Triage / Clinical Relationship To Patient Self Return Phone Number [phone] (Primary) Chief Complaint BREATHING - shortness of breath or sounds breathless Reason for Call Symptomatic / Request for [place] states she is having extreme dizziness and shortness of breath of here and there. Translation No Nurse Assessment Nurse: Cherre Robins, RN, Ria Comment Date/Time (Eastern Time): 01/22/2020 11:27:44 AM Confirm and document reason for call. If symptomatic, describe symptoms. ---Caller states she is having extreme dizziness and occasional SOB. No current SOB. No fever. Has the patient had close contact with a person known or suspected to have the novel coronavirus illness OR traveled / lives in area with major community spread (including international travel) in the last 14 days from the onset of symptoms? * If Asymptomatic, screen for exposure and travel within the last 14 days. ---No Does the patient have any new or worsening symptoms? ---Yes Will a triage be completed? ---Yes Related visit to physician within the last 2 weeks? ---Yes Does the PT have any chronic conditions? (i.e. diabetes, asthma, this includes High risk factors for pregnancy, etc.) ---No Is this a behavioral health or substance abuse call? ---No Guidelines Guideline Title Affirmed Question Affirmed Notes Nurse Date/Time [name] Time) Neurologic Deficit Headache (and neurologic deficit) Weiss-Hilton,  RN, Ria Comment 01/22/2020 11:33:01 AM Disp. Time [name] Time) Disposition Final User 01/22/2020 11:27:04 AM Send to Urgent Starleen Arms, [place] 01/22/2020 11:34:55 AM Go to ED Now Yes Weiss-Hilton, RN, Lorrene Reid NOTE: All timestamps contained within this report are represented as [place] Standard Time. CONFIDENTIALTY NOTICE: This fax transmission is intended only for the addressee. It contains information that is legally privileged, confidential or otherwise protected from use or disclosure. If you are not the intended recipient, you are strictly prohibited from reviewing, disclosing, copying using or disseminating any of this information or taking any action in reliance on or regarding this information. If you have received this fax in error, please notify us immediately by telephone so that we can arrange for its return to [place]. Phone: [phone], Toll-Free: [phone], Fax: [phone] Page: 2 of 2 Call Id: [id number] [place] Disagree/Comply Disagree Caller Understands Yes PreDisposition InappropriateToAsk Care Advice Given Per Guideline GO TO ED NOW: * You need to be seen in the Emergency Department. * Go to the ED at ___________ [place] now. Drive carefully. NOTE TO TRIAGER - DRIVING: * Another adult should drive. * If immediate transportation is not available via car or taxi, then the patient should be instructed to call [id number]. CARE ADVICE given per Neurologic Deficit (Adult) guideline. Comments User: Carolan Clines, RN Date/Time [name] Time): 01/22/2020 11:28:39 AM had his ears cleaned out by ENT in the last two weeks. User: Carolan Clines, RN Date/Time [name] Time): 01/22/2020 11:31:22 AM Now states her right arm and right hand feels tingling "like it goes to sleep". User: Carolan Clines, RN Date/Time [name] Time): 01/22/2020 11:34:45 AM Caller states she doesn't want to be seen in ED per final dispo- states she wants to speak to the office to  make appt. User: Carolan Clines, RN Date/Time [name] Time): 01/22/2020  11:35:18 AM Sttaes she feels off balance when she walks. User: Carolan Clines, RN Date/Time [name] Time): 01/22/2020 11:42:20 AM RN contacted the backline per instructions from client directives when pt refused the final dispo advice and spoke to someone who placed this RN on hold after telling this RN that she needs to get a nurse on the line. RN disconnected after five minutes due to long hold time. User: Carolan Clines, RN Date/Time [name] Time): 01/22/2020 11:43:56 AM RN called back and was told that the triage office nurse Rina is speaking to the caller now. Referrals GO TO FACILITY REFUSED

## 2020-01-22 NOTE — ED Provider Notes (Signed)
[place]   [id number] 01/22/20 Arrival Time: 2376  EG:BTDV THROAT  SUBJECTIVE: History from: patient.  [name] is a 86 y.o. female who presents with gradual onset of nasal congestion, headache, fatigue, sinus pain and pressure for the last 2 weeks.  Reports that she has also had mild dizziness since she had her ears cleaned out at the ER doctor a few weeks ago.  Reports that her ears have also been itchy. Denies sick exposure to Covid, strep, flu or mono, or precipitating event.  Has not taken OTC medications for this.  There are no aggravating or alleviating symptoms.    Denies fever, chills, fatigue, ear pain, rhinorrhea, SOB, wheezing, chest pain, nausea, rash, changes in bowel or bladder habits.     ROS: As per HPI.  All other pertinent ROS negative.     Past Medical History:  Diagnosis Date  . Arrhythmia   . Arthritis   . Atypical chest pain    Lexiscan myoview (5/11) with EF 84%, normal wall motion, small fixed apical  perfusion defect likely due to breast attenuation, no evidence for ischemia or infarction.  **Patient had an  . Basal cell carcinoma 07/28/19 EDC   R pretibial  . Breast cancer ([place])    Bilateral mastectomies 1986.  [name] CVA (cerebral infarction) 10/12   right lacunar  . Depression   . Dyspnea    Echo (5/11) was a difficult study due to breast implants but showed normal LV and RV size and systolic function.      [name] GERD (gastroesophageal reflux disease)   . Headache    s/p shingles - right side of head  . History of partial thyroidectomy   . Hyperlipidemia   . Hypertension    in past - no current meds/issues  . Obstruction of intestine ([place])   . Partial small bowel obstruction ([place]) 7/14   no surgery  . Wears dentures    full upper  . Zoster     with Post-herpetic neuralgia   Past Surgical History:  Procedure Laterality Date  . ABDOMINAL HYSTERECTOMY    . BREAST SURGERY Bilateral    cancer - mastectomy and implant insertion  .  MASTECTOMY  1980   bilateral  . NECK SURGERY    . TARSORRHAPHY Bilateral 08/16/2015   Procedure: MINOR TARSORRAPHY LATERAL PLACEMENT;  Surgeon: Karle Starch, MD;  Location: [facility];  Service: Ophthalmology;  Laterality: Bilateral;  . VAGINAL DELIVERY     Allergies  Allergen Reactions  . Duloxetine     REACTION: N/V Mental status change and trouble with balance  . Cymbalta [Duloxetine Hcl]     Unsure of reaction  . Maxitrol [Neomycin-Polymyxin-Dexameth]     Unsure of reaction  . Pregabalin     REACTION: SOB, swollen lips  . Tobradex [Tobramycin-Dexamethasone]     Unsure of reaction   No current facility-administered medications on file prior to encounter.   Current Outpatient Medications on File Prior to Encounter  Medication Sig Dispense Refill  . acyclovir (ZOVIRAX) 400 MG tablet Take 1 tablet (400 mg total) by mouth 4 (four) times daily. As needed if shingles flares up 1 tablet 0  . fexofenadine (ALLEGRA) 180 MG tablet Take 180 mg by mouth daily.    . fluticasone (FLONASE) 50 MCG/ACT nasal spray PLACE 2 SPRAYS INTO EACH NOSTRIL ONCE DAILY 16 g 11  . hydrocortisone 2.5 % cream Apply topically 3 (three) times daily as needed. 28 g 3  . LORazepam (  ATIVAN) 1 MG tablet TAKE 1/2 TO ONE TABLET BY MOUTH TWICE DAILY AS NEEDED FOR ANXIETY 60 tablet 0  . morphine (MSIR) 15 MG tablet TAKE 1 TABLET BY MOUTH EVERY 3 HOURS AS NEEDED FOR PAIN 150 tablet 0  . Multiple Vitamin (MULTIVITAMIN) tablet Take 1 tablet by mouth once daily    . NONFORMULARY OR COMPOUNDED ITEM See pharmacy note 120 each 2  . pantoprazole (PROTONIX) 40 MG tablet Take 1 tablet (40 mg total) by mouth 2 (two) times daily. 180 tablet 3  . polyethylene glycol (MIRALAX / GLYCOLAX) packet Take 17 g by mouth at bedtime.    . RESTASIS 0.05 % ophthalmic emulsion     . topiramate (TOPAMAX) 100 MG tablet Take 0.5-1 tablets (50-100 mg total) by mouth at bedtime. (Patient taking differently: Take 50-100 mg by mouth at bedtime.  1/2 tablet at bedtime) 60 tablet 11   Social History   Socioeconomic History  . Marital status: Widowed    Spouse name: Not on file  . Number of children: 2  . Years of education: Not on file  . Highest education level: Not on file  Occupational History  . Occupation: retired Museum/gallery exhibitions officer: RETIRED  Tobacco Use  . Smoking status: Former Research scientist (life sciences)  . Smokeless tobacco: Never Used  Substance and Sexual Activity  . Alcohol use: No    Alcohol/week: 0.0 standard drinks  . Drug use: No  . Sexual activity: Not Currently  Other Topics Concern  . Not on file  Social History Narrative   No living will   No health care POA but requests daughter-in-law [name] to do this   Would like attempts at resuscitation   No feeding tube if cognitively unaware   Social Determinants of Health   Financial Resource Strain:   . Difficulty of Paying Living Expenses:   Food Insecurity:   . Worried About Charity fundraiser in the Last Year:   . Arboriculturist in the Last Year:   Transportation Needs:   . Film/video editor (Medical):   [name] Lack of Transportation (Non-Medical):   Physical Activity:   . Days of Exercise per Week:   . Minutes of Exercise per Session:   Stress:   . Feeling of Stress :   Social Connections:   . Frequency of Communication with Friends and Family:   . Frequency of Social Gatherings with Friends and Family:   . Attends Religious Services:   . Active Member of Clubs or Organizations:   . Attends Archivist Meetings:   [name] Marital Status:   Intimate Partner Violence:   . Fear of Current or Ex-Partner:   . Emotionally Abused:   [name] Physically Abused:   . Sexually Abused:    Family History  Problem Relation Age of Onset  . Heart disease Son        open heart surgery for a blockage  . Heart Problems Son   . Diabetes Son   . Parkinson's disease Son     OBJECTIVE:  Vitals:   01/22/20 1243  BP: (!) 144/78  Pulse: 69  Resp: 18  Temp: 99 F  (37.2 C)  TempSrc: Oral  SpO2: 95%     General appearance: alert; appears fatigued, but nontoxic, speaking in full sentences and managing own secretions HEENT: NCAT; Ears: EACs clear, TMs pearly gray with visible cone of light, without erythema; Eyes: PERRL, EOMI grossly; Nose: no obvious rhinorrhea; Throat: oropharynx clear, tonsils  1+ and mildly erythematous without white tonsillar exudates, uvula midline Sinuses: Maxillary tenderness Neck: supple without LAD Lungs: CTA bilaterally without adventitious breath sounds; cough absent Heart: regular rate and rhythm.  Radial pulses 2+ symmetrical bilaterally Skin: warm and dry Psychological: alert and cooperative; normal mood and affect  LABS: No results found for this or any previous visit (from the past 24 hour(s)).   ASSESSMENT & PLAN:  1. Sinus pain   2. Nasal congestion   3. Acute non-recurrent maxillary sinusitis     Meds ordered this encounter  Medications  . amoxicillin-clavulanate (AUGMENTIN) 875-125 MG tablet    Sig: Take 1 tablet by mouth 2 (two) times daily for 10 days.    Dispense:  20 tablet    Refill:  0    Order Specific Question:   Supervising Provider    Answer:   Chase Picket [id number]    Acute Sinusitis Push fluids and get rest Prescribed Augmentin 875mg  twice daily for 10 days.   Take as directed and to completion.  May use over-the-counter drops for itchy ears Drink warm or cool liquids, use throat lozenges, or popsicles to help alleviate symptoms Take OTC ibuprofen or tylenol as needed for pain Follow up with PCP if symptoms persist Return or go to ER if you have any new or worsening symptoms such as fever, chills, nausea, vomiting, worsening sore throat, cough, abdominal pain, chest pain, changes in bowel or bladder habits.   Reviewed expectations re: course of current medical issues. Questions answered. Outlined signs and symptoms indicating need for more acute intervention. Patient verbalized  understanding. After Visit Summary given.          Faustino Congress, NP 01/22/20 1334

## 2020-01-22 NOTE — Telephone Encounter (Signed)
She was seen at urgent care and treated for sinusitis Please check on her on Monday

## 2020-01-22 NOTE — ED Triage Notes (Signed)
Pt presents with sinus pressure, productive cough ("slimy" mucous, unsure of color) and SOB x a couple weeks.  Will come and go.  Reports dizziness since she had her ears cleaned out recently.  Also reports tingling on R fingers starting today. The tingling is reproducible with positions.  Pt c/o soreness on top of head that has been present since she had a shingles attack years ago.    Daughter in law is at bedside and is caregiver.

## 2020-01-22 NOTE — Discharge Instructions (Addendum)
You have a sinus infection.  I have prescribed Augmentin for you to take twice a day for 10 days  I would recommend that you get some OTC drops for the itchy ears  Follow up with this office or with primary care if you are not feeling better over the next 2 days.  Follow up with the ER for trouble swallowing, trouble breathing, other concerning symptoms.

## 2020-01-22 NOTE — Telephone Encounter (Signed)
[name] with access nurse said that pt was having dizziness, off balanced, tingling in rt arm and hand, H/A and SOB(SOB was not right now). Pt refused recommendation of ED; call was disconnected. I called pt and she gave symptoms above but said they were worse on 01/21/20 and not so much now. Pt does have prod cough with white to brown phlegm and throat is burning. Pt said she is not sure if she can get a way to be seen today and asked if I would call [name]. I spoke with [name] (DPR signed) and she said she will pick pt up now and take to [facility] in [place]. Pt voiced understanding and will wait for [name] to pick her up. FYI to [name].

## 2020-01-23 LAB — SARS-COV-2, NAA 2 DAY TAT

## 2020-01-23 LAB — NOVEL CORONAVIRUS, NAA: SARS-CoV-2, NAA: NOT DETECTED

## 2020-01-25 NOTE — Telephone Encounter (Signed)
Spoke to pt. She started the Augmentin. Took 3 of them and it was causing terrible vomiting. She would like a different antibiotic called in to [facility]. She is aware it will be tomorrow before something gets done.

## 2020-01-26 MED ORDER — AZITHROMYCIN 250 MG PO TABS
ORAL_TABLET | ORAL | 0 refills | Status: DC
Start: 2020-01-26 — End: 2020-02-22

## 2020-01-26 NOTE — Telephone Encounter (Signed)
z pack sent to pharmacy

## 2020-01-26 NOTE — Addendum Note (Signed)
Addended by: Jearld Fenton on: 01/26/2020 09:20 AM   Modules accepted: Orders

## 2020-01-28 NOTE — Telephone Encounter (Signed)
Left message on voicemail.

## 2020-02-01 ENCOUNTER — Other Ambulatory Visit: Payer: Self-pay | Admitting: Internal Medicine

## 2020-02-01 NOTE — Telephone Encounter (Signed)
Name of [place] Name of [place] or Written Date and Quantity:12-03-19 #150 Last Office Visit and Type:11-16-19 Next Office Visit and Type:02-22-20 Last Controlled Substance Agreement Date:04-27-19 Last UDS:04-27-19

## 2020-02-02 ENCOUNTER — Other Ambulatory Visit: Payer: Self-pay

## 2020-02-03 DIAGNOSIS — H5713 Ocular pain, bilateral: Secondary | ICD-10-CM | POA: Diagnosis not present

## 2020-02-22 ENCOUNTER — Other Ambulatory Visit: Payer: Self-pay

## 2020-02-22 ENCOUNTER — Encounter: Payer: Self-pay | Admitting: Internal Medicine

## 2020-02-22 ENCOUNTER — Ambulatory Visit (INDEPENDENT_AMBULATORY_CARE_PROVIDER_SITE_OTHER): Payer: PPO | Admitting: Internal Medicine

## 2020-02-22 DIAGNOSIS — E440 Moderate protein-calorie malnutrition: Secondary | ICD-10-CM | POA: Diagnosis not present

## 2020-02-22 DIAGNOSIS — F1120 Opioid dependence, uncomplicated: Secondary | ICD-10-CM | POA: Diagnosis not present

## 2020-02-22 DIAGNOSIS — B0229 Other postherpetic nervous system involvement: Secondary | ICD-10-CM

## 2020-02-22 NOTE — Assessment & Plan Note (Signed)
She feels she eats enough Discussed that she needs to eat more if she can

## 2020-02-22 NOTE — Assessment & Plan Note (Signed)
PDMP reviewed No concerns 

## 2020-02-22 NOTE — Progress Notes (Signed)
Subjective:    Patient ID: [name], female    DOB: [birth year], 86 y.o.   MRN: [id number]  HPI Here with daughter for follow up of severe post herpetic neuralgia and narcotic dependence This visit occurred during the SARS-CoV-2 public health emergency.  Safety protocols were in place, including screening questions prior to the visit, additional usage of staff PPE, and extensive cleaning of exam room while observing appropriate contact time as indicated for disinfecting solutions.   Still with chronic itching and pain from the shingles Right V1 Still uses the morphine three times most days It does help---but she doesn't want any more doses Still on topiramate at bedtime--able to sleep  Current Outpatient Medications on File Prior to Visit  Medication Sig Dispense Refill   acyclovir (ZOVIRAX) 400 MG tablet Take 1 tablet (400 mg total) by mouth 4 (four) times daily. As needed if shingles flares up 1 tablet 0   fexofenadine (ALLEGRA) 180 MG tablet Take 180 mg by mouth daily.     fluticasone (FLONASE) 50 MCG/ACT nasal spray PLACE 2 SPRAYS INTO EACH NOSTRIL ONCE DAILY 16 g 11   hydrocortisone 2.5 % cream Apply topically 3 (three) times daily as needed. 28 g 3   LORazepam (ATIVAN) 1 MG tablet TAKE 1/2 TO ONE TABLET BY MOUTH TWICE DAILY AS NEEDED FOR ANXIETY 60 tablet 0   morphine (MSIR) 15 MG tablet TAKE 1 TABLET BY MOUTH EVERY 3 HOURS AS NEEDED FOR PAIN 150 tablet 0   Multiple Vitamin (MULTIVITAMIN) tablet Take 1 tablet by mouth once daily     NONFORMULARY OR COMPOUNDED ITEM See pharmacy note 120 each 2   pantoprazole (PROTONIX) 40 MG tablet Take 1 tablet (40 mg total) by mouth 2 (two) times daily. 180 tablet 3   polyethylene glycol (MIRALAX / GLYCOLAX) packet Take 17 g by mouth at bedtime.     RESTASIS 0.05 % ophthalmic emulsion      topiramate (TOPAMAX) 100 MG tablet Take 0.5-1 tablets (50-100 mg total) by mouth at bedtime. (Patient taking differently: Take 50-100 mg by  mouth at bedtime. 1/2 tablet at bedtime) 60 tablet 11   No current facility-administered medications on file prior to visit.    Allergies  Allergen Reactions   Duloxetine     REACTION: N/V Mental status change and trouble with balance   Cymbalta [Duloxetine Hcl]     Unsure of reaction   Maxitrol [Neomycin-Polymyxin-Dexameth]     Unsure of reaction   Pregabalin     REACTION: SOB, swollen lips   Tobradex [Tobramycin-Dexamethasone]     Unsure of reaction    Past Medical History:  Diagnosis Date   Arrhythmia    Arthritis    Atypical chest pain    Lexiscan myoview (5/11) with EF 84%, normal wall motion, small fixed apical  perfusion defect likely due to breast attenuation, no evidence for ischemia or infarction.  **Patient had an   Basal cell carcinoma 07/28/19 EDC   R pretibial   Breast cancer ([place])    Bilateral mastectomies 1986.   CVA (cerebral infarction) 10/12   right lacunar   Depression    Dyspnea    Echo (5/11) was a difficult study due to breast implants but showed normal LV and RV size and systolic function.       GERD (gastroesophageal reflux disease)    Headache    s/p shingles - right side of head   History of partial thyroidectomy    Hyperlipidemia  Hypertension    in past - no current meds/issues   Obstruction of intestine ([facility])    Partial small bowel obstruction ([place]) 7/14   no surgery   Wears dentures    full upper   Zoster     with Post-herpetic neuralgia    Past Surgical History:  Procedure Laterality Date   ABDOMINAL HYSTERECTOMY     BREAST SURGERY Bilateral    cancer - mastectomy and implant insertion   MASTECTOMY  1980   bilateral   NECK SURGERY     TARSORRHAPHY Bilateral 08/16/2015   Procedure: MINOR TARSORRAPHY LATERAL PLACEMENT;  Surgeon: Karle Starch, MD;  Location: [facility];  Service: Ophthalmology;  Laterality: Bilateral;   VAGINAL DELIVERY      Family History  Problem Relation Age of Onset     Heart disease Son        open heart surgery for a blockage   Heart Problems Son    Diabetes Son    Parkinson's disease Son     Social History   Socioeconomic History   Marital status: Widowed    Spouse name: Not on file   Number of children: 2   Years of education: Not on file   Highest education level: Not on file  Occupational History   Occupation: retired Museum/gallery exhibitions officer: RETIRED  Tobacco Use   Smoking status: Former Smoker   Smokeless tobacco: Never Used  Substance and Sexual Activity   Alcohol use: No    Alcohol/week: 0.0 standard drinks   Drug use: No   Sexual activity: Not Currently  Other Topics Concern   Not on file  Social History Narrative   No living will   No health care POA but requests daughter-in-law [name] to do this   Would like attempts at resuscitation   No feeding tube if cognitively unaware   Social Determinants of Health   Financial Resource Strain:    Difficulty of Paying Living Expenses:   Food Insecurity:    Worried About Charity fundraiser in the Last Year:    Arboriculturist in the Last Year:   Transportation Needs:    Film/video editor (Medical):    Lack of Transportation (Non-Medical):   Physical Activity:    Days of Exercise per Week:    Minutes of Exercise per Session:   Stress:    Feeling of Stress :   Social Connections:    Frequency of Communication with Friends and Family:    Frequency of Social Gatherings with Friends and Family:    Attends Religious Services:    Active Member of Clubs or Organizations:    Attends Music therapist:    Marital Status:   Intimate Partner Violence:    Fear of Current or Ex-Partner:    Emotionally Abused:    Physically Abused:    Sexually Abused:    Review of Systems  Uses the ativan at night also Appetite is still not so great---weight is stable Chronic mucus in nose and throat----no SOB. Allegra regularly--some help     Objective:   Physical Exam HENT:     Head:     Comments: No active vesicles in right V1 Cardiovascular:     Rate and Rhythm: Normal rate and regular rhythm.     Heart sounds: No murmur heard.  No gallop.   Pulmonary:     Effort: Pulmonary effort is normal.     Breath sounds: Normal breath  sounds. No wheezing or rales.  Lymphadenopathy:     Cervical: No cervical adenopathy.  Neurological:     Mental Status: She is alert.            Assessment & Plan:

## 2020-02-22 NOTE — Assessment & Plan Note (Signed)
Severe and chronic On the topiramate and morphine Daily antivital still as well

## 2020-03-15 ENCOUNTER — Other Ambulatory Visit: Payer: Self-pay | Admitting: Internal Medicine

## 2020-03-15 NOTE — Telephone Encounter (Signed)
Name of [place] Name of Pharmacy:[facility] Last [place] or Written Date and Quantity: 02-01-20 #150 Last Office Visit and Type:02-22-20 Next Office Visit and Type:06-28-20 Last Controlled Substance Agreement Date:04-27-19 Last UDS:04-27-19  Lorazepam last filled 01-12-20 #60

## 2020-04-04 DIAGNOSIS — H5713 Ocular pain, bilateral: Secondary | ICD-10-CM | POA: Diagnosis not present

## 2020-04-18 DIAGNOSIS — H5713 Ocular pain, bilateral: Secondary | ICD-10-CM | POA: Diagnosis not present

## 2020-04-25 ENCOUNTER — Ambulatory Visit: Payer: PPO | Admitting: Dermatology

## 2020-04-25 ENCOUNTER — Other Ambulatory Visit: Payer: Self-pay

## 2020-04-25 DIAGNOSIS — L820 Inflamed seborrheic keratosis: Secondary | ICD-10-CM

## 2020-04-25 DIAGNOSIS — L821 Other seborrheic keratosis: Secondary | ICD-10-CM

## 2020-04-25 DIAGNOSIS — L97811 Non-pressure chronic ulcer of other part of right lower leg limited to breakdown of skin: Secondary | ICD-10-CM

## 2020-04-25 DIAGNOSIS — D692 Other nonthrombocytopenic purpura: Secondary | ICD-10-CM | POA: Diagnosis not present

## 2020-04-25 DIAGNOSIS — L98491 Non-pressure chronic ulcer of skin of other sites limited to breakdown of skin: Secondary | ICD-10-CM

## 2020-04-25 DIAGNOSIS — L578 Other skin changes due to chronic exposure to nonionizing radiation: Secondary | ICD-10-CM

## 2020-04-25 NOTE — Patient Instructions (Addendum)

## 2020-04-25 NOTE — Progress Notes (Signed)
   Follow-Up Visit   Subjective  [name] is a 86 y.o. female who presents for the following: Follow-up (OV 10/20/19 for R lower leg) and area of concern (R lower leg, has been using neosporin). Patient presents today for follow up on OV 10/20/19 for R pretibia, hx of ulcer. Patient has new concern just above previous ulcer, has been using neosporin oint. and will not go away.  The following portions of the chart were reviewed this encounter and updated as appropriate:  Tobacco  Allergies  Meds  Problems  Med Hx  Surg Hx  Fam Hx     Review of Systems:  No other skin or systemic complaints except as noted in HPI or Assessment and Plan.  Objective  Well appearing patient in no apparent distress; mood and affect are within normal limits.  A focused examination was performed including Right lower leg. Relevant physical exam findings are noted in the Assessment and Plan.  Objective  Right Pretibia: Healed today  Objective  Right Lower Leg - Anterior: Erythematous keratotic or waxy stuck-on papule or plaque.   Objective  Right Lower Leg - Anterior: Violaceous macules and patches.    Assessment & Plan  Non-pressure chronic ulcer of skin of other sites limited to breakdown of skin (HCC) Right Pretibia Clear. Observe for recurrence. Call clinic for new or changing lesions.  Recommend regular skin exams, daily broad-spectrum spf 30+ sunscreen use, and photoprotection.     Inflamed seborrheic keratosis Right Lower Leg - Anterior Cryotherapy today Prior to procedure, discussed risks of blister formation, small wound, skin dyspigmentation, or rare scar following cryotherapy.   Destruction of lesion - Right Lower Leg - Anterior Complexity: simple   Destruction method: cryotherapy   Informed consent: discussed and consent obtained   Timeout:  patient name, date of birth, surgical site, and procedure verified Lesion destroyed using liquid nitrogen: Yes   Region frozen until  ice ball extended beyond lesion: Yes   Outcome: patient tolerated procedure well with no complications   Post-procedure details: wound care instructions given    Senile purpura ([place]) Right Lower Leg - Anterior Benign, observe.   Seborrheic Keratoses - Stuck-on, waxy, tan-brown papules and plaques  - Discussed benign etiology and prognosis. - Observe - Call for any changes  Actinic Damage - diffuse scaly erythematous macules with underlying dyspigmentation - Recommend daily broad spectrum sunscreen SPF 30+ to sun-exposed areas, reapply every 2 hours as needed.  - Call for new or changing lesions.  Return in about 1 year (around 04/25/2021) for TBSE.  I, Donzetta Kohut, CMA, am acting as scribe for Sarina Ser, MD . Documentation: I have reviewed the above documentation for accuracy and completeness, and I agree with the above.  Sarina Ser, MD

## 2020-04-26 ENCOUNTER — Encounter: Payer: Self-pay | Admitting: Dermatology

## 2020-05-09 ENCOUNTER — Other Ambulatory Visit: Payer: Self-pay | Admitting: Internal Medicine

## 2020-05-09 NOTE — Telephone Encounter (Signed)
Name of [place] Name of Pharmacy:[facility] Last Fill or Written Date and Quantity: 03-16-20#150 Last Office Visit and Type:02-22-20 Next Office Visit and Type:06-28-20 Last Controlled Substance Agreement Date:04-27-19 Last UDS:04-27-19  Lorazepam last filled 03-16-20 #60

## 2020-05-14 ENCOUNTER — Telehealth: Payer: Self-pay

## 2020-05-14 NOTE — Telephone Encounter (Signed)
We received PA requests generated by [place] on CoverMyMeds for lorazepam. I went on CoverMyMeds. Processed the PA and received a message that stated medication available without PA.

## 2020-05-20 DIAGNOSIS — J301 Allergic rhinitis due to pollen: Secondary | ICD-10-CM | POA: Diagnosis not present

## 2020-05-20 DIAGNOSIS — H6980 Other specified disorders of Eustachian tube, unspecified ear: Secondary | ICD-10-CM | POA: Diagnosis not present

## 2020-05-20 DIAGNOSIS — R0981 Nasal congestion: Secondary | ICD-10-CM | POA: Diagnosis not present

## 2020-06-07 ENCOUNTER — Ambulatory Visit: Payer: PPO | Admitting: Dermatology

## 2020-06-07 ENCOUNTER — Other Ambulatory Visit: Payer: Self-pay

## 2020-06-07 DIAGNOSIS — L97319 Non-pressure chronic ulcer of right ankle with unspecified severity: Secondary | ICD-10-CM

## 2020-06-07 DIAGNOSIS — L98499 Non-pressure chronic ulcer of skin of other sites with unspecified severity: Secondary | ICD-10-CM

## 2020-06-07 DIAGNOSIS — L821 Other seborrheic keratosis: Secondary | ICD-10-CM

## 2020-06-07 DIAGNOSIS — L219 Seborrheic dermatitis, unspecified: Secondary | ICD-10-CM

## 2020-06-07 MED ORDER — KETOCONAZOLE 2 % EX CREA: TOPICAL_CREAM | CUTANEOUS | 1 refills | Status: DC

## 2020-06-07 MED ORDER — KETOCONAZOLE 2 % EX SHAM: MEDICATED_SHAMPOO | CUTANEOUS | 2 refills | Status: DC

## 2020-06-07 MED ORDER — HYDROCORTISONE 2.5 % EX CREA: TOPICAL_CREAM | CUTANEOUS | 1 refills | Status: DC

## 2020-06-07 NOTE — Progress Notes (Signed)
   Follow-Up Visit   Subjective  [name] is a 86 y.o. female who presents for the following: Sore area (right ankle, present for a few weeks. Has used mupirocin 2% ointment and DuoDerm patches on area.).  Hx of decubitus ulcer on the right ankle treated for years. Area not rubbed by shoe. Denies laying on right side when she sleeps.  No h/o leg swelling or varicose veins.  Area is very painful. She also has itching and scaling on bil ears and hairline.  The following portions of the chart were reviewed this encounter and updated as appropriate:      Review of Systems:  No other skin or systemic complaints except as noted in HPI or Assessment and Plan.  Objective  Well appearing patient in no apparent distress; mood and affect are within normal limits.  A focused examination was performed including face, ears, lower leg. Relevant physical exam findings are noted in the Assessment and Plan.  Objective  Right Ankle: 3.72mm superficial ulceration with surrounding pink white scar, tender to touch  Objective  Ears, scalp: Mild scaling on frontal hairline, ears.   Assessment & Plan    Seborrheic Keratoses - Stuck-on, waxy, tan-brown papules and plaques on left and right cheek - Discussed benign etiology and prognosis. - Observe - Call for any changes   Skin ulcer, unspecified ulcer stage ([place]) Right Ankle  Discussed gentle wound care.  Recommend DuoDerm Thick changing q 2-3 days. Apply small amount of mupirocin 2% ointment to ulcer before applying DuoDerm.  Order for DuoDERM CGF sent to [facility].  Discussed eval for possible neuropathy if pain not improved when ulcer improved.     Seborrheic dermatitis Ears, scalp  Start ketconazole 2% shampoo Apply to scalp and behind ears 1x/wk, let sit 10 minutes before rinsing. If scalp still itchy, recommend more frequent washing.  Start HC 2.5% Cream Apply to Aas frontal hairline, ears qd/bid prn as directed. Start  ketoconazole 2% cream Apply to Aas face, ears qd/bid as directed.  Seborrheic Dermatitis  -  is a chronic persistent rash characterized by pinkness and scaling most commonly of the mid face but also can occur on the scalp (dandruff); mid chest and mid back. It tends to be exacerbated by stress and cooler weather.  People who have neurologic disease may experience new onset or exacerbation of existing seborrheic dermatitis.  The condition is not curable but treatable and can be controlled.    ketoconazole (NIZORAL) 2 % shampoo - Ears, scalp  ketoconazole (NIZORAL) 2 % cream - Ears, scalp  hydrocortisone 2.5 % cream - Ears, scalp  Return in about 2 months (around 08/07/2020) for ulcer, seb derm.  IJamesetta Orleans, CMA, am acting as scribe for Brendolyn Patty, MD .  Documentation: I have reviewed the above documentation for accuracy and completeness, and I agree with the above.  Brendolyn Patty MD

## 2020-06-07 NOTE — Patient Instructions (Signed)
Seborrheic Dermatitis ° °Mix hydrocortisone with ketaconazole 2% twice a day. If improved, decrease to hydrocortisone and ketaconazole mixed once a day. If still clear, decrease to ketaconazole only. ° ° °Seborrheic Dermatitis ° °What is seborrheic dermatitis? °Seborrheic (say: seb-oh-ree-ick) dermatitis is a disease that causes flaking of the skin.  It usually affects the scalp.  In teenagers and adults, it is commonly called “dandruff”.  In infants, it is referred to as “cradle cap”.  Dandruff often appears as scaling on the scalp with or without redness.  On other parts of the body, seborrheic dermatitis tends to produce both redness and scaling.  Other common locations of seborrheic dermatitis include the central face, eyebrows, chest, and the creases of the arms, legs, and groin.  It often causes the skin to look a little greasy, scaly, or flaky. °Seborrheic dermatitis can occur at any age.  It often comes and goes and may to be seasonally related, especially in the Northern climates. ° °What causes seborrheic dermatitis? °The exact cause is not known, though yeast of the Malassezia species may be involved.  This organism is normally present on the skin in small numbers, but sometimes its numbers increase, especially in oily skin.  Treatments that reduce the yeast tend to improve seborrheic dermatitis. ° °How is seborrheic dermatitis treated? °The treatment of seborrheic dermatitis depends on its location on the body and the person’s age. °Seborrheic dermatitis of the scalp (dandruff) in adults and teenagers is usually treated with a medicated shampoo.  Here is a list of the medications that help, and the over-counter shampoos that contain them: °Salicylic acid (Neutrogena T/Sal, Sebulex, Scalpicin, Denorex Extra Strength) °Zinc pyrithione (Head & Shoulders white bottle, Denorex Daily, DHS Zinc, Pantene Pro-V Pyrithione Zinc) °Selenium sulfide (Head & Shoulders blue bottle, Selsun Blue, Exsel Lotion Shampoo,  Glo-Sel) °Coal tar (Neutrogena T/Gal, Pentrax, Zetar, Tegrin, DHS Tar, Therapeutic Denorex) °Ketoconazole (Nizoral) ° °If you have dandruff, you might start by using one of these shampoos every day until your dandruff is controlled and then keep using it at least twice a week.  Often times your doctor will recommend a rotation of several different medicated shampoos as some will experience a plateau in the effectiveness of any one shampoo.  ° °When you use a dandruff shampoo, rub the shampoo into your wet hair and massage into scalp thoroughly.  Let it stay on your hair and scalp for 5 minutes before rinsing.  If you have involvement in the eyebrows or face, you can lather those areas with the medicated shampoo as well, or use a medicated soap (ZNP-bar, Polytar Soap, SAStid, or sulfur soap).   ° °If the wash or shampoo alone does not help, your doctor might want you to use a prescription medication once or twice a day.  Leave-in medications for the scalp are best applied by massaging into the scalp immediately after towel drying your hair, but may be applied even if you have not washed your hair. ° °Seborrheic dermatitis in infants usually clears up by age 8 -12 months.  It may develop in the diaper area where it might be confused with diaper rash.  For milder cases you can try gently brushing out scales with a soft brush.  This is best done immediately after washing with a non-medicated baby shampoo (Johnson [employer], Gerber, etc.).  Your doctor may recommend a medicated shampoo or a prescription topical medication.  ° °

## 2020-06-08 DIAGNOSIS — E11621 Type 2 diabetes mellitus with foot ulcer: Secondary | ICD-10-CM | POA: Diagnosis not present

## 2020-06-18 ENCOUNTER — Other Ambulatory Visit: Payer: Self-pay

## 2020-06-18 ENCOUNTER — Emergency Department: Payer: PPO

## 2020-06-18 ENCOUNTER — Inpatient Hospital Stay
Admission: EM | Admit: 2020-06-18 | Discharge: 2020-06-22 | DRG: 480 | Disposition: A | Discharge status: Skilled Nursing Facility | Payer: PPO | Attending: Internal Medicine | Admitting: Internal Medicine

## 2020-06-18 ENCOUNTER — Inpatient Hospital Stay: Admit: 2020-06-18 | Payer: PPO | Admitting: Specialist

## 2020-06-18 DIAGNOSIS — Y92000 Kitchen of unspecified non-institutional (private) residence as  the place of occurrence of the external cause: Secondary | ICD-10-CM

## 2020-06-18 DIAGNOSIS — I2510 Atherosclerotic heart disease of native coronary artery without angina pectoris: Secondary | ICD-10-CM | POA: Diagnosis not present

## 2020-06-18 DIAGNOSIS — W19XXXD Unspecified fall, subsequent encounter: Secondary | ICD-10-CM | POA: Diagnosis not present

## 2020-06-18 DIAGNOSIS — Z8673 Personal history of transient ischemic attack (TIA), and cerebral infarction without residual deficits: Secondary | ICD-10-CM | POA: Diagnosis not present

## 2020-06-18 DIAGNOSIS — Z9882 Breast implant status: Secondary | ICD-10-CM

## 2020-06-18 DIAGNOSIS — Z85828 Personal history of other malignant neoplasm of skin: Secondary | ICD-10-CM | POA: Diagnosis not present

## 2020-06-18 DIAGNOSIS — F419 Anxiety disorder, unspecified: Secondary | ICD-10-CM | POA: Diagnosis present

## 2020-06-18 DIAGNOSIS — R52 Pain, unspecified: Secondary | ICD-10-CM | POA: Diagnosis not present

## 2020-06-18 DIAGNOSIS — E785 Hyperlipidemia, unspecified: Secondary | ICD-10-CM | POA: Diagnosis present

## 2020-06-18 DIAGNOSIS — Z9013 Acquired absence of bilateral breasts and nipples: Secondary | ICD-10-CM | POA: Diagnosis not present

## 2020-06-18 DIAGNOSIS — S72142A Displaced intertrochanteric fracture of left femur, initial encounter for closed fracture: Secondary | ICD-10-CM | POA: Diagnosis not present

## 2020-06-18 DIAGNOSIS — W19XXXA Unspecified fall, initial encounter: Secondary | ICD-10-CM | POA: Diagnosis not present

## 2020-06-18 DIAGNOSIS — H5461 Unqualified visual loss, right eye, normal vision left eye: Secondary | ICD-10-CM | POA: Diagnosis not present

## 2020-06-18 DIAGNOSIS — I10 Essential (primary) hypertension: Secondary | ICD-10-CM | POA: Diagnosis not present

## 2020-06-18 DIAGNOSIS — Z8619 Personal history of other infectious and parasitic diseases: Secondary | ICD-10-CM | POA: Diagnosis not present

## 2020-06-18 DIAGNOSIS — F32A Depression, unspecified: Secondary | ICD-10-CM | POA: Diagnosis not present

## 2020-06-18 DIAGNOSIS — F329 Major depressive disorder, single episode, unspecified: Secondary | ICD-10-CM | POA: Diagnosis not present

## 2020-06-18 DIAGNOSIS — R279 Unspecified lack of coordination: Secondary | ICD-10-CM | POA: Diagnosis not present

## 2020-06-18 DIAGNOSIS — K219 Gastro-esophageal reflux disease without esophagitis: Secondary | ICD-10-CM | POA: Diagnosis not present

## 2020-06-18 DIAGNOSIS — E43 Unspecified severe protein-calorie malnutrition: Secondary | ICD-10-CM | POA: Diagnosis not present

## 2020-06-18 DIAGNOSIS — Z833 Family history of diabetes mellitus: Secondary | ICD-10-CM | POA: Diagnosis not present

## 2020-06-18 DIAGNOSIS — Z853 Personal history of malignant neoplasm of breast: Secondary | ICD-10-CM

## 2020-06-18 DIAGNOSIS — S72145D Nondisplaced intertrochanteric fracture of left femur, subsequent encounter for closed fracture with routine healing: Secondary | ICD-10-CM | POA: Diagnosis not present

## 2020-06-18 DIAGNOSIS — R1310 Dysphagia, unspecified: Secondary | ICD-10-CM | POA: Diagnosis present

## 2020-06-18 DIAGNOSIS — R54 Age-related physical debility: Secondary | ICD-10-CM | POA: Diagnosis present

## 2020-06-18 DIAGNOSIS — Z820 Family history of epilepsy and other diseases of the nervous system: Secondary | ICD-10-CM | POA: Diagnosis not present

## 2020-06-18 DIAGNOSIS — S72002A Fracture of unspecified part of neck of left femur, initial encounter for closed fracture: Secondary | ICD-10-CM

## 2020-06-18 DIAGNOSIS — Z20822 Contact with and (suspected) exposure to covid-19: Secondary | ICD-10-CM | POA: Diagnosis present

## 2020-06-18 DIAGNOSIS — Z681 Body mass index (BMI) 19 or less, adult: Secondary | ICD-10-CM

## 2020-06-18 DIAGNOSIS — Z888 Allergy status to other drugs, medicaments and biological substances status: Secondary | ICD-10-CM | POA: Diagnosis not present

## 2020-06-18 DIAGNOSIS — R278 Other lack of coordination: Secondary | ICD-10-CM | POA: Diagnosis not present

## 2020-06-18 DIAGNOSIS — W010XXA Fall on same level from slipping, tripping and stumbling without subsequent striking against object, initial encounter: Secondary | ICD-10-CM | POA: Diagnosis not present

## 2020-06-18 DIAGNOSIS — Y92009 Unspecified place in unspecified non-institutional (private) residence as the place of occurrence of the external cause: Secondary | ICD-10-CM

## 2020-06-18 DIAGNOSIS — S72009A Fracture of unspecified part of neck of unspecified femur, initial encounter for closed fracture: Secondary | ICD-10-CM | POA: Diagnosis not present

## 2020-06-18 DIAGNOSIS — R55 Syncope and collapse: Secondary | ICD-10-CM | POA: Diagnosis not present

## 2020-06-18 DIAGNOSIS — Z741 Need for assistance with personal care: Secondary | ICD-10-CM | POA: Diagnosis not present

## 2020-06-18 DIAGNOSIS — R2689 Other abnormalities of gait and mobility: Secondary | ICD-10-CM | POA: Diagnosis not present

## 2020-06-18 DIAGNOSIS — Z419 Encounter for procedure for purposes other than remedying health state, unspecified: Secondary | ICD-10-CM

## 2020-06-18 DIAGNOSIS — M6281 Muscle weakness (generalized): Secondary | ICD-10-CM | POA: Diagnosis not present

## 2020-06-18 DIAGNOSIS — Z01810 Encounter for preprocedural cardiovascular examination: Secondary | ICD-10-CM | POA: Diagnosis not present

## 2020-06-18 DIAGNOSIS — R5381 Other malaise: Secondary | ICD-10-CM | POA: Diagnosis not present

## 2020-06-18 DIAGNOSIS — I447 Left bundle-branch block, unspecified: Secondary | ICD-10-CM | POA: Diagnosis not present

## 2020-06-18 DIAGNOSIS — S72142D Displaced intertrochanteric fracture of left femur, subsequent encounter for closed fracture with routine healing: Secondary | ICD-10-CM | POA: Diagnosis not present

## 2020-06-18 LAB — PROTIME-INR
INR: 1.1 (ref 0.8–1.2)
Prothrombin Time: 13.8 seconds (ref 11.4–15.2)

## 2020-06-18 LAB — CBC WITH DIFFERENTIAL/PLATELET
Abs Immature Granulocytes: 0.04 10*3/uL (ref 0.00–0.07)
Basophils Absolute: 0 10*3/uL (ref 0.0–0.1)
Basophils Relative: 0 %
Eosinophils Absolute: 0.1 10*3/uL (ref 0.0–0.5)
Eosinophils Relative: 1 %
HCT: 37.6 % (ref 36.0–46.0)
Hemoglobin: 12.2 g/dL (ref 12.0–15.0)
Immature Granulocytes: 1 %
Lymphocytes Relative: 20 %
Lymphs Abs: 1.8 10*3/uL (ref 0.7–4.0)
MCH: 32 pg (ref 26.0–34.0)
MCHC: 32.4 g/dL (ref 30.0–36.0)
MCV: 98.7 fL (ref 80.0–100.0)
Monocytes Absolute: 0.5 10*3/uL (ref 0.1–1.0)
Monocytes Relative: 6 %
Neutro Abs: 6.2 10*3/uL (ref 1.7–7.7)
Neutrophils Relative %: 72 %
Platelets: 130 10*3/uL — ABNORMAL LOW (ref 150–400)
RBC: 3.81 MIL/uL — ABNORMAL LOW (ref 3.87–5.11)
RDW: 13.6 % (ref 11.5–15.5)
WBC: 8.6 10*3/uL (ref 4.0–10.5)
nRBC: 0 % (ref 0.0–0.2)

## 2020-06-18 LAB — BASIC METABOLIC PANEL
Anion gap: 9 (ref 5–15)
BUN: 23 mg/dL (ref 8–23)
CO2: 23 mmol/L (ref 22–32)
Calcium: 8.7 mg/dL — ABNORMAL LOW (ref 8.9–10.3)
Chloride: 108 mmol/L (ref 98–111)
Creatinine, Ser: 0.7 mg/dL (ref 0.44–1.00)
GFR, Estimated: >60 mL/min (ref >60)
Glucose, Bld: 139 mg/dL — ABNORMAL HIGH (ref 70–99)
Potassium: 3.8 mmol/L (ref 3.5–5.1)
Sodium: 140 mmol/L (ref 135–145)

## 2020-06-18 LAB — TYPE AND SCREEN
ABO/RH(D): A POS
Antibody Screen: NEGATIVE

## 2020-06-18 LAB — RESP PANEL BY RT-PCR (FLU A&B, COVID) ARPGX2
Influenza A by PCR: NEGATIVE
Influenza B by PCR: NEGATIVE
SARS Coronavirus 2 by RT PCR: NEGATIVE

## 2020-06-18 MED ORDER — ONDANSETRON HCL 4 MG/2ML IJ SOLN
4.0000 mg | Freq: Four times a day (QID) | INTRAMUSCULAR | Status: DC | PRN
  Administered 2020-06-18: 16:00:00 4 mg via INTRAVENOUS
  Filled 2020-06-18: qty 2

## 2020-06-18 MED ORDER — PROPOFOL 500 MG/50ML IV EMUL
INTRAVENOUS | Status: AC
  Filled 2020-06-18: qty 50

## 2020-06-18 MED ORDER — TOPIRAMATE 100 MG PO TABS
100.0000 mg | ORAL_TABLET | Freq: Two times a day (BID) | ORAL | Status: DC
  Administered 2020-06-18 – 2020-06-22 (×7): 100 mg via ORAL
  Filled 2020-06-18 (×2): qty 4
  Filled 2020-06-18: qty 1
  Filled 2020-06-18: qty 4
  Filled 2020-06-18 (×5): qty 1
  Filled 2020-06-18: qty 4

## 2020-06-18 MED ORDER — PANTOPRAZOLE SODIUM 40 MG PO TBEC
40.0000 mg | DELAYED_RELEASE_TABLET | Freq: Two times a day (BID) | ORAL | Status: DC
  Administered 2020-06-18 – 2020-06-22 (×7): 40 mg via ORAL
  Filled 2020-06-18 (×7): qty 1

## 2020-06-18 MED ORDER — KETOROLAC TROMETHAMINE 30 MG/ML IJ SOLN
INTRAMUSCULAR | Status: AC
  Filled 2020-06-18: qty 1

## 2020-06-18 MED ORDER — LIDOCAINE HCL (PF) 2 % IJ SOLN
INTRAMUSCULAR | Status: AC
  Filled 2020-06-18: qty 5

## 2020-06-18 MED ORDER — MORPHINE SULFATE (PF) 2 MG/ML IV SOLN: 1.0000 mg | INTRAVENOUS | Status: DC | PRN

## 2020-06-18 MED ORDER — LORATADINE 10 MG PO TABS
10.0000 mg | ORAL_TABLET | Freq: Every day | ORAL | Status: DC
  Administered 2020-06-20 – 2020-06-22 (×3): 10 mg via ORAL
  Filled 2020-06-18 (×3): qty 1

## 2020-06-18 MED ORDER — AMLODIPINE BESYLATE 5 MG PO TABS
5.0000 mg | ORAL_TABLET | Freq: Every day | ORAL | Status: DC
Start: 2020-06-18 — End: 2020-06-22
  Administered 2020-06-19 – 2020-06-22 (×4): 5 mg via ORAL
  Filled 2020-06-18 (×4): qty 1

## 2020-06-18 MED ORDER — METHOCARBAMOL 500 MG PO TABS
500.0000 mg | ORAL_TABLET | Freq: Four times a day (QID) | ORAL | Status: DC | PRN
  Administered 2020-06-19 – 2020-06-21 (×2): 500 mg via ORAL
  Filled 2020-06-18 (×3): qty 1

## 2020-06-18 MED ORDER — HYDROCODONE-ACETAMINOPHEN 5-325 MG PO TABS
1.0000 | ORAL_TABLET | Freq: Four times a day (QID) | ORAL | Status: DC | PRN
  Administered 2020-06-18 (×2): 1 via ORAL
  Administered 2020-06-20: 2 via ORAL
  Filled 2020-06-18 (×2): qty 1
  Filled 2020-06-18: qty 2

## 2020-06-18 MED ORDER — CYCLOSPORINE 0.05 % OP EMUL
1.0000 [drp] | Freq: Two times a day (BID) | OPHTHALMIC | Status: DC
  Administered 2020-06-18 – 2020-06-22 (×7): 1 [drp] via OPHTHALMIC
  Filled 2020-06-18 (×10): qty 1

## 2020-06-18 MED ORDER — FENTANYL CITRATE (PF) 100 MCG/2ML IJ SOLN: 50.0000 ug | INTRAMUSCULAR | Status: DC | PRN

## 2020-06-18 MED ORDER — SENNA 8.6 MG PO TABS
1.0000 | ORAL_TABLET | Freq: Two times a day (BID) | ORAL | Status: DC
  Administered 2020-06-18 – 2020-06-19 (×2): 8.6 mg via ORAL
  Filled 2020-06-18 (×3): qty 1

## 2020-06-18 MED ORDER — POLYETHYLENE GLYCOL 3350 17 G PO PACK
17.0000 g | PACK | Freq: Every day | ORAL | Status: DC
  Administered 2020-06-18 – 2020-06-21 (×4): 17 g via ORAL
  Filled 2020-06-18 (×4): qty 1

## 2020-06-18 MED ORDER — METHOCARBAMOL 1000 MG/10ML IJ SOLN
500.0000 mg | Freq: Four times a day (QID) | INTRAVENOUS | Status: DC | PRN
  Filled 2020-06-18: qty 5

## 2020-06-18 MED ORDER — ADULT MULTIVITAMIN W/MINERALS CH
1.0000 | ORAL_TABLET | Freq: Every day | ORAL | Status: DC
  Administered 2020-06-20 – 2020-06-22 (×3): 1 via ORAL
  Filled 2020-06-18 (×3): qty 1

## 2020-06-18 MED ORDER — FLUTICASONE PROPIONATE 50 MCG/ACT NA SUSP
2.0000 | Freq: Every day | NASAL | Status: DC
  Administered 2020-06-20 – 2020-06-22 (×2): 2 via NASAL
  Filled 2020-06-18: qty 16

## 2020-06-18 MED ORDER — MORPHINE SULFATE (PF) 2 MG/ML IV SOLN: 0.5000 mg | INTRAVENOUS | Status: DC | PRN

## 2020-06-18 MED ORDER — LORAZEPAM 0.5 MG PO TABS
0.5000 mg | ORAL_TABLET | Freq: Two times a day (BID) | ORAL | Status: DC
  Administered 2020-06-19 – 2020-06-20 (×2): 0.5 mg via ORAL
  Filled 2020-06-18 (×3): qty 1

## 2020-06-18 NOTE — ED Notes (Signed)
Attempted foley cath x2, multiple RN attempt unable. Pt reports has had surgery in vaginal area.

## 2020-06-18 NOTE — ED Triage Notes (Signed)
Pt to ED [facility] for trip and fall, shortening to left leg noted, skin tear to right forearm.  Alert and oriented

## 2020-06-18 NOTE — Progress Notes (Signed)
[place] in [place] called to get info on pt coming to sx in am. Verified that pt awaiting clearance from cardiology per Dr. Sabra Heck before procedure.

## 2020-06-18 NOTE — H&P (Signed)
History and Physical    [name] HWE:[id number] DOB: 10/03/1927 DOA: 06/18/2020  PCP: Venia Carbon, MD   Patient coming from: Home  I have personally briefly reviewed patient's old medical records in [place]  Chief Complaint: Left hip pain  HPI: [name] is a 86 y.o. female with medical history significant for GERD, hypertension, depression who presents to the emergency room for evaluation after a fall.  Patient states that she had finished breakfast and got up from the couch to go get a snack when she thinks she blacked out and fell.  She states that she usually feels dizzy in the mornings and that this is not unusual and is related to some of the medications that she takes.  She uses a cane in the house and a walker when she has to leave her apartment.  She had her cane when she fell.  She denies having any nausea, no vomiting, no diarrhea, no chest pain, no shortness of breath, no fever, no chills. Patient fell and was unable to get up or bear weight on her left lower extremity.  Imaging shows a comminuted, displaced intertrochanteric fracture of the left hip. Labs show sodium 140, potassium 3.8, chloride 108, bicarb 23, serum creatinine 0.70, calcium 8.7, white count 8.6, hemoglobin 12.2, hematocrit 37.6, MCV 98.7, RDW 13.6, platelet count 130, PT 13.8, INR 1.1 Respiratory viral panel is negative Twelve-lead EKG shows normal sinus rhythm with LVH   ED Course: Patient is a 86 year old Caucasian female who presents to the emergency room following a mechanical fall with a left comminuted, displaced intertrochanteric femur fracture.  She has been seen by orthopedic surgery and surgical repair is planned for a.m.  Review of Systems: As per HPI otherwise 10 point review of systems negative.    Past Medical History:  Diagnosis Date  . Arrhythmia   . Arthritis   . Atypical chest pain    Lexiscan myoview (5/11) with EF 84%, normal wall motion, small fixed apical   perfusion defect likely due to breast attenuation, no evidence for ischemia or infarction.  **Patient had an  . Basal cell carcinoma 07/28/19 [facility]   R pretibial  . Breast cancer ([place])    Bilateral mastectomies 1986.  [name] CVA (cerebral infarction) 10/12   right lacunar  . Depression   . Dyspnea    Echo (5/11) was a difficult study due to breast implants but showed normal LV and RV size and systolic function.      [name] GERD (gastroesophageal reflux disease)   . Headache    s/p shingles - right side of head  . History of partial thyroidectomy   . Hyperlipidemia   . Hypertension    in past - no current meds/issues  . Obstruction of intestine ([place])   . Partial small bowel obstruction ([place]) 7/14   no surgery  . Superficial basal cell carcinoma 06/18/2019   R mid to distal pretibial  . Wears dentures    full upper  . Zoster     with Post-herpetic neuralgia    Past Surgical History:  Procedure Laterality Date  . ABDOMINAL HYSTERECTOMY    . BREAST SURGERY Bilateral    cancer - mastectomy and implant insertion  . MASTECTOMY  1980   bilateral  . NECK SURGERY    . TARSORRHAPHY Bilateral 08/16/2015   Procedure: MINOR TARSORRAPHY LATERAL PLACEMENT;  Surgeon: Karle Starch, MD;  Location: [facility];  Service: Ophthalmology;  Laterality: Bilateral;  .  VAGINAL DELIVERY       reports that she has quit smoking. She has never used smokeless tobacco. She reports that she does not drink alcohol and does not use drugs.  Allergies  Allergen Reactions  . Duloxetine     REACTION: N/V Mental status change and trouble with balance  . Cymbalta [Duloxetine Hcl]     Unsure of reaction  . Maxitrol [Neomycin-Polymyxin-Dexameth]     Unsure of reaction  . Pregabalin     REACTION: SOB, swollen lips  . Tobradex [Tobramycin-Dexamethasone]     Unsure of reaction    Family History  Problem Relation Age of Onset  . Heart disease Son        open heart surgery for a blockage  . Heart Problems Son    . Diabetes Son   . Parkinson's disease Son      Prior to Admission medications   Medication Sig Start Date End Date Taking? Authorizing Provider  acyclovir (ZOVIRAX) 400 MG tablet Take 1 tablet (400 mg total) by mouth 4 (four) times daily. As needed if shingles flares up 08/17/19   Venia Carbon, MD  fexofenadine (ALLEGRA) 180 MG tablet Take 180 mg by mouth daily.    [provider]  fluticasone (FLONASE) 50 MCG/ACT nasal spray PLACE 2 SPRAYS INTO EACH NOSTRIL ONCE DAILY 03/05/19   Viviana Simpler I, MD  hydrocortisone 2.5 % cream Apply topically 3 (three) times daily as needed. 11/16/19   Viviana Simpler I, MD  hydrocortisone 2.5 % cream Apply to face, ears 1-2 times a day as directed. 06/07/20   Brendolyn Patty, MD  ketoconazole (NIZORAL) 2 % cream Apply to affected areas face and frontal scalp 1-2 times a day as directed. 06/07/20   Brendolyn Patty, MD  ketoconazole (NIZORAL) 2 % shampoo Massage into scalp and behind ears 1-2 times a week, let sit 10 minutes before rinsing. 06/07/20   Brendolyn Patty, MD  LORazepam (ATIVAN) 1 MG tablet TAKE 1/2 TO ONE TABLET BY MOUTH TWICE DAILY AS NEEDED FOR ANXIETY 05/09/20   Viviana Simpler I, MD  morphine (MSIR) 15 MG tablet TAKE 1 TABLET BY MOUTH EVERY 3 HOURS AS NEEDED FOR PAIN 05/09/20   Venia Carbon, MD  Multiple Vitamin (MULTIVITAMIN) tablet Take 1 tablet by mouth once daily    [provider]  NONFORMULARY OR COMPOUNDED ITEM See pharmacy note 03/05/19   Edrick Kins, DPM  pantoprazole (PROTONIX) 40 MG tablet Take 1 tablet (40 mg total) by mouth 2 (two) times daily. 11/16/19   Venia Carbon, MD  polyethylene glycol (MIRALAX / Floria Raveling) packet Take 17 g by mouth at bedtime.    [provider]  RESTASIS 0.05 % ophthalmic emulsion  02/24/18   [provider]  topiramate (TOPAMAX) 100 MG tablet TAKE 1 TABLET BY MOUTH TWICE DAILY 03/15/20   Venia Carbon, MD    Physical Exam: Vitals:   06/18/20 1105 06/18/20  1114 06/18/20 1415  BP:  (!) 191/69 (!) 156/88  Pulse:  68 84  Resp:  20   Temp:  98.1 F (36.7 C)   TempSrc:  Oral   SpO2:  98% 100%  Weight: 36.7 kg    Height: 5' (1.524 m)       Vitals:   06/18/20 1105 06/18/20 1114 06/18/20 1415  BP:  (!) 191/69 (!) 156/88  Pulse:  68 84  Resp:  20   Temp:  98.1 F (36.7 C)   TempSrc:  Oral  SpO2:  98% 100%  Weight: 36.7 kg    Height: 5' (1.524 m)      Constitutional: NAD, alert and oriented x 3.  Thin and frail Eyes: PERRL, lids and conjunctivae normal ENMT: Mucous membranes are moist.  Neck: normal, supple, no masses, no thyromegaly Respiratory: clear to auscultation bilaterally, no wheezing, no crackles. Normal respiratory effort. No accessory muscle use.  Cardiovascular: Regular rate and rhythm, no murmurs / rubs / gallops. No extremity edema. 2+ pedal pulses. No carotid bruits.  Abdomen: no tenderness, no masses palpated. No hepatosplenomegaly. Bowel sounds positive.  Musculoskeletal: no clubbing / cyanosis.  Decreased range of motion left hip, shortening of left lower extremity Skin: no rashes, lesions, ulcers.  Neurologic: No gross focal neurologic deficit. Psychiatric: Normal mood and affect.   Labs on Admission: I have personally reviewed following labs and imaging studies  CBC: Recent Labs  Lab 06/18/20 1109  WBC 8.6  NEUTROABS 6.2  HGB 12.2  HCT 37.6  MCV 98.7  PLT 676*   Basic Metabolic Panel: Recent Labs  Lab 06/18/20 1109  NA 140  K 3.8  CL 108  CO2 23  GLUCOSE 139*  BUN 23  CREATININE 0.70  CALCIUM 8.7*   GFR: Estimated Creatinine Clearance: 27.1 mL/min (by C-G formula based on SCr of 0.7 mg/dL). Liver Function Tests: No results for input(s): AST, ALT, ALKPHOS, BILITOT, PROT, ALBUMIN in the last 168 hours. No results for input(s): LIPASE, AMYLASE in the last 168 hours. No results for input(s): AMMONIA in the last 168 hours. Coagulation Profile: Recent Labs  Lab 06/18/20 1109  INR 1.1    Cardiac Enzymes: No results for input(s): CKTOTAL, CKMB, CKMBINDEX, TROPONINI in the last 168 hours. BNP (last 3 results) No results for input(s): PROBNP in the last 8760 hours. HbA1C: No results for input(s): HGBA1C in the last 72 hours. CBG: No results for input(s): GLUCAP in the last 168 hours. Lipid Profile: No results for input(s): CHOL, HDL, LDLCALC, TRIG, CHOLHDL, LDLDIRECT in the last 72 hours. Thyroid Function Tests: No results for input(s): TSH, T4TOTAL, FREET4, T3FREE, THYROIDAB in the last 72 hours. Anemia Panel: No results for input(s): VITAMINB12, FOLATE, FERRITIN, TIBC, IRON, RETICCTPCT in the last 72 hours. Urine analysis:    Component Value Date/Time   COLORURINE Straw 01/22/2013 0235   COLORURINE YELLOW 05/05/2011 1400   APPEARANCEUR Clear 01/22/2013 0235   LABSPEC 1.002 01/22/2013 0235   PHURINE 6.0 01/22/2013 0235   PHURINE 6.0 05/05/2011 1400   GLUCOSEU Negative 01/22/2013 0235   HGBUR 1+ 01/22/2013 0235   HGBUR TRACE (A) 05/05/2011 1400   HGBUR negative 11/09/2008 1141   BILIRUBINUR negative 04/17/2018 1453   BILIRUBINUR Negative 01/22/2013 0235   KETONESUR Negative 01/22/2013 0235   KETONESUR NEGATIVE 05/05/2011 1400   PROTEINUR Negative 04/17/2018 1453   PROTEINUR Negative 01/22/2013 0235   PROTEINUR NEGATIVE 05/05/2011 1400   UROBILINOGEN 0.2 04/17/2018 1453   UROBILINOGEN 0.2 05/05/2011 1400   NITRITE negative 04/17/2018 1453   NITRITE Negative 01/22/2013 0235   NITRITE NEGATIVE 05/05/2011 1400   LEUKOCYTESUR Negative 04/17/2018 1453   LEUKOCYTESUR Negative 01/22/2013 0235    Radiological Exams on Admission: DG Hip Unilat With Pelvis 2-3 Views Left  Result Date: 06/18/2020 CLINICAL DATA:  Left leg pain after fall. EXAM: DG HIP (WITH OR WITHOUT PELVIS) 2-3V LEFT COMPARISON:  January 20, 2013. FINDINGS: Moderately displaced and comminuted fracture is seen involving the intertrochanteric region of the proximal left femur. Sclerotic focus is noted  in the proximal  left femur which was present on prior exam and is most consistent with benign enostosis. IMPRESSION: Moderately displaced and comminuted intertrochanteric fracture of proximal left femur. Electronically Signed   By: Marijo Conception M.D.   On: 06/18/2020 11:59    EKG: Independently reviewed.  Sinus rhythm with LVH  Assessment/Plan Principal Problem:   Intertrochanteric fracture of left femur (HCC) Active Problems:   GERD   Hypertension   Depression      Status post fall With intertrochanteric fracture of left femur Immobilize left lower extremity Pain control And muscle relaxants We will consult orthopedic surgery    ??  Syncopal episode Patient states that she feels she blacked out It was unwitnessed We will obtain 2D echocardiogram to assess LVEF and rule out aortic stenosis We will request cardiology consult We will place patient on telemetry   Hypertension Blood pressure is uncontrolled due to pain Start patient on amlodipine 5 mg daily Uptitrate dose to optimize blood pressure control   Anxiety disorder Continue as needed lorazepam   GERD Continue PPI   DVT prophylaxis: SCD Code Status: Full code Family Communication: Greater than 50% of time was spent discussing patient's condition and plan of care with her and her daughter-in-law who is her caregiver at the bedside.  All questions and concerns have been addressed.  They verbalized understanding and agree with the plan. Disposition Plan: Back to previous home environment Consults called: Orthopedic surgery/cardiology    Yohann Curl MD [facility]     06/18/2020, 2:31 PM

## 2020-06-18 NOTE — ED Notes (Signed)
Bracelet, anklet and watch given to daughter

## 2020-06-18 NOTE — ED Provider Notes (Signed)
[facility] Emergency Department Provider Note   ____________________________________________    I have reviewed the triage vital signs and the nursing notes.   HISTORY  Chief Complaint Fall     HPI [name] is a 86 y.o. female who presents after a fall.  Patient reports she lost her balance in her kitchen, fell onto her left side.  Complains of pain in her left hip.  She is not on blood thinners.  Denies head injury or neck injury.  No chest pain abdominal pain or upper extremity injury besides a mild skin tear on her left elbow.  No nausea or vomiting.  Was unable to stand on her own  Past Medical History:  Diagnosis Date  . Arrhythmia   . Arthritis   . Atypical chest pain    Lexiscan myoview (5/11) with EF 84%, normal wall motion, small fixed apical  perfusion defect likely due to breast attenuation, no evidence for ischemia or infarction.  **Patient had an  . Basal cell carcinoma 07/28/19 EDC   R pretibial  . Breast cancer ([place])    Bilateral mastectomies 1986.  [name] CVA (cerebral infarction) 10/12   right lacunar  . Depression   . Dyspnea    Echo (5/11) was a difficult study due to breast implants but showed normal LV and RV size and systolic function.      [name] GERD (gastroesophageal reflux disease)   . Headache    s/p shingles - right side of head  . History of partial thyroidectomy   . Hyperlipidemia   . Hypertension    in past - no current meds/issues  . Obstruction of intestine ([place])   . Partial small bowel obstruction ([place]) 7/14   no surgery  . Superficial basal cell carcinoma 06/18/2019   R mid to distal pretibial  . Wears dentures    full upper  . Zoster     with Post-herpetic neuralgia    Patient Active Problem List   Diagnosis Date Noted  . Intertrochanteric fracture of left femur ([place]) 06/18/2020  . Depression   . Fall at home, initial encounter   . Benzodiazepine dependence ([place]) 04/17/2018  . PAD (peripheral  artery disease) ([place]) 07/22/2017  . Narcotic dependence ([place]) 01/03/2017  . Thrombocytopenia ([place]) 09/20/2016  . 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 Postherpetic neuralgia 12/15/2015  . Advance directive discussed with patient 10/27/2015  . Allergic rhinitis due to pollen 05/20/2015  . Routine general medical examination at a health care facility 02/22/2014  . Protein-calorie malnutrition ([place]) 03/24/2012  . Hypertension   . B12 deficiency 05/03/2009  . Neuropathy ([place]) 04/04/2009  . Episodic mood disorder ([place]) 06/16/2008  . Varicella-zoster infection 11/25/2007  . GLAUCOMA NOS 11/14/2006  . SLEEP DISORDER 11/14/2006  . HYPERLIPIDEMIA 08/28/2006  . GERD 08/28/2006  . OSTEOARTHRITIS 08/28/2006  . BREAST CANCER, HX OF 08/28/2006    Past Surgical History:  Procedure Laterality Date  . ABDOMINAL HYSTERECTOMY    . BREAST SURGERY Bilateral    cancer - mastectomy and implant insertion  . MASTECTOMY  1980   bilateral  . NECK SURGERY    . TARSORRHAPHY Bilateral 08/16/2015   Procedure: MINOR TARSORRAPHY LATERAL PLACEMENT;  Surgeon: Karle Starch, MD;  Location: [place];  Service: Ophthalmology;  Laterality: Bilateral;  . VAGINAL DELIVERY      Prior to Admission medications   Medication Sig Start Date End Date Taking? Authorizing Provider  fluticasone (FLONASE) 50 MCG/ACT nasal spray  PLACE 2 SPRAYS INTO EACH NOSTRIL ONCE DAILY Patient taking differently: Place 2 sprays into both nostrils daily. 03/05/19  Yes Venia Carbon, MD  acyclovir (ZOVIRAX) 400 MG tablet Take 1 tablet (400 mg total) by mouth 4 (four) times daily. As needed if shingles flares up Patient not taking: Reported on 06/18/2020 08/17/19   Viviana Simpler I, MD  aspirin 81 MG EC tablet Take 81 mg by mouth daily.    [provider]  azelastine (ASTELIN) 0.1 % nasal spray Place 2 sprays into both nostrils 2 (two) times daily. 05/20/20   [provider]  fexofenadine (ALLEGRA)  180 MG tablet Take 180 mg by mouth daily.    [provider]  hydrocortisone 2.5 % cream Apply topically 3 (three) times daily as needed. 11/16/19   Viviana Simpler I, MD  hydrocortisone 2.5 % cream Apply to face, ears 1-2 times a day as directed. 06/07/20   Brendolyn Patty, MD  ketoconazole (NIZORAL) 2 % cream Apply to affected areas face and frontal scalp 1-2 times a day as directed. 06/07/20   Brendolyn Patty, MD  ketoconazole (NIZORAL) 2 % shampoo Massage into scalp and behind ears 1-2 times a week, let sit 10 minutes before rinsing. 06/07/20   Brendolyn Patty, MD  LORazepam (ATIVAN) 1 MG tablet TAKE 1/2 TO ONE TABLET BY MOUTH TWICE DAILY AS NEEDED FOR ANXIETY Patient taking differently: Take 0.5-1 mg by mouth 2 (two) times daily. TAKE 1/2 TO ONE TABLET BY MOUTH TWICE DAILY AS NEEDED FOR ANXIETY 05/09/20   Viviana Simpler I, MD  morphine (MSIR) 15 MG tablet TAKE 1 TABLET BY MOUTH EVERY 3 HOURS AS NEEDED FOR PAIN Patient taking differently: Take 15 mg by mouth every 3 (three) hours as needed for severe pain. TAKE 1 TABLET BY MOUTH EVERY 3 HOURS AS NEEDED FOR PAIN 05/09/20   Venia Carbon, MD  Multiple Vitamin (MULTIVITAMIN) tablet Take 1 tablet by mouth once daily    [provider]  NONFORMULARY OR COMPOUNDED ITEM See pharmacy note 03/05/19   Edrick Kins, DPM  pantoprazole (PROTONIX) 40 MG tablet Take 1 tablet (40 mg total) by mouth 2 (two) times daily. 11/16/19   Venia Carbon, MD  polyethylene glycol (MIRALAX / Floria Raveling) packet Take 17 g by mouth at bedtime.    [provider]  RESTASIS 0.05 % ophthalmic emulsion Place 1 drop into both eyes 2 (two) times daily. 02/24/18   [provider]  topiramate (TOPAMAX) 100 MG tablet TAKE 1 TABLET BY MOUTH TWICE DAILY Patient taking differently: Take 100 mg by mouth 2 (two) times daily. 03/15/20   Venia Carbon, MD     Allergies Duloxetine, Cymbalta [duloxetine hcl], Maxitrol [neomycin-polymyxin-dexameth],  Pregabalin, and Tobradex [tobramycin-dexamethasone]  Family History  Problem Relation Age of Onset  . Heart disease Son        open heart surgery for a blockage  . Heart Problems Son   . Diabetes Son   . Parkinson's disease Son     Social History Social History   Tobacco Use  . Smoking status: Former Research scientist (life sciences)  . Smokeless tobacco: Never Used  Substance Use Topics  . Alcohol use: No    Alcohol/week: 0.0 standard drinks  . Drug use: No    Review of Systems  Constitutional: No fever/chills Eyes: No visual changes.  ENT: No sore throat. Cardiovascular: Denies chest pain. Respiratory: Denies shortness of breath. Gastrointestinal: No abdominal pain.  No nausea, no vomiting.   Genitourinary: Negative for  dysuria. Musculoskeletal: As above Skin: Negative for rash. Neurological: Negative for headaches    ____________________________________________   PHYSICAL EXAM:  VITAL SIGNS: ED Triage Vitals  Enc Vitals Group     BP 06/18/20 1114 (!) 191/69     Pulse Rate 06/18/20 1114 68     Resp 06/18/20 1114 20     Temp 06/18/20 1114 98.1 F (36.7 C)     Temp Source 06/18/20 1114 Oral     SpO2 06/18/20 1114 98 %     Weight 06/18/20 1105 36.7 kg (81 lb)     Height 06/18/20 1105 1.524 m (5')     Head Circumference --      Peak Flow --      Pain Score 06/18/20 1105 8     Pain Loc --      Pain Edu? --      Excl. in [place]? --     Constitutional: Alert and oriented.  Nose: No congestion/rhinnorhea. Mouth/Throat: Mucous membranes are moist.   Neck:  Painless ROM Cardiovascular: Normal rate, regular rhythm. Grossly normal heart sounds.  Good peripheral circulation.  No chest wall tenderness palpation Respiratory: Normal respiratory effort.  No retractions. Lungs CTAB. Gastrointestinal: Soft and nontender. No distention.  No CVA tenderness. Genitourinary: deferred Musculoskeletal: Shortening of the left leg, not externally rotated this time tender along the proximal femur  laterally particularly, 2+ pulses distally Neurologic:  Normal speech and language. No gross focal neurologic deficits are appreciated.  Skin:  Skin is warm, dry and intact. No rash noted. Psychiatric: Mood and affect are normal. Speech and behavior are normal.  ____________________________________________   LABS (all labs ordered are listed, but only abnormal results are displayed)  Labs Reviewed  BASIC METABOLIC PANEL - Abnormal; Notable for the following components:      Result Value   Glucose, Bld 139 (*)    Calcium 8.7 (*)    All other components within normal limits  CBC WITH DIFFERENTIAL/PLATELET - Abnormal; Notable for the following components:   RBC 3.81 (*)    Platelets 130 (*)    All other components within normal limits  RESP PANEL BY RT-PCR (FLU A&B, COVID) ARPGX2  PROTIME-INR  TYPE AND SCREEN   ____________________________________________  EKG  ED ECG REPORT I, Lavonia Drafts, the attending physician, personally viewed and interpreted this ECG.  Date: 06/18/2020  Rhythm: normal sinus rhythm QRS Axis: normal Intervals: normal ST/T Wave abnormalities: Nonspecific changes Narrative Interpretation: no evidence of acute ischemia  ____________________________________________  RADIOLOGY  Hip x-ray reviewed, left intertrochanteric fracture ____________________________________________   PROCEDURES  Procedure(s) performed: No  Procedures   Critical Care performed: No ____________________________________________   INITIAL IMPRESSION / ASSESSMENT AND PLAN / ED COURSE  Pertinent labs & imaging results that were available during my care of the patient were reviewed by me and considered in my medical decision making (see chart for details).  Patient presents for mechanical fall with left hip pain, concerning for hip fracture.  Shortening noted on exam, pain is mostly controlled, fentanyl as needed  X-ray confirms left intertrochanteric fracture  Not on  blood thinners, discussed with Dr. Sabra Heck of orthopedics  Admitted to the hospitalist service    ____________________________________________   FINAL CLINICAL IMPRESSION(S) / ED DIAGNOSES  Final diagnoses:  Closed fracture of left hip, initial encounter [facility])        Note:  This document was prepared using Dragon voice recognition software and may include unintentional dictation errors.   Lavonia Drafts, MD 06/18/20 1447

## 2020-06-18 NOTE — Consult Note (Signed)
ORTHOPAEDIC CONSULTATION  REQUESTING PHYSICIAN: Lavonia Drafts, MD  Chief Complaint: Left hip pain  HPI: [name] is a 86 y.o. female who complains of left hip pain after a fall in her apartment this morning.  The patient ate some breakfast and then apparently fell.  She was brought to emergency room where exam and x-rays revealed a comminuted displaced intertrochanteric fracture of the left hip.  The patient is being admitted for operative stabilization of the fracture and medical clearance for surgery.  Patient daughter is present and actively helps take care of her.  I have recommended surgical fixation of the fracture since she is independent and ambulatory.  The risks and benefits of surgery were discussed with the patient and her daughter and they wish to proceed.  We will plan to do surgery later today since she did eat some breakfast.  Past Medical History:  Diagnosis Date  . Arrhythmia   . Arthritis   . Atypical chest pain    Lexiscan myoview (5/11) with EF 84%, normal wall motion, small fixed apical  perfusion defect likely due to breast attenuation, no evidence for ischemia or infarction.  **Patient had an  . Basal cell carcinoma 07/28/19 EDC   R pretibial  . Breast cancer ([place])    Bilateral mastectomies 1986.  [name] CVA (cerebral infarction) 10/12   right lacunar  . Depression   . Dyspnea    Echo (5/11) was a difficult study due to breast implants but showed normal LV and RV size and systolic function.      [name] GERD (gastroesophageal reflux disease)   . Headache    s/p shingles - right side of head  . History of partial thyroidectomy   . Hyperlipidemia   . Hypertension    in past - no current meds/issues  . Obstruction of intestine ([place])   . Partial small bowel obstruction ([place]) 7/14   no surgery  . Superficial basal cell carcinoma 06/18/2019   R mid to distal pretibial  . Wears dentures    full upper  . Zoster     with Post-herpetic neuralgia   Past Surgical  History:  Procedure Laterality Date  . ABDOMINAL HYSTERECTOMY    . BREAST SURGERY Bilateral    cancer - mastectomy and implant insertion  . MASTECTOMY  1980   bilateral  . NECK SURGERY    . TARSORRHAPHY Bilateral 08/16/2015   Procedure: MINOR TARSORRAPHY LATERAL PLACEMENT;  Surgeon: Karle Starch, MD;  Location: [place];  Service: Ophthalmology;  Laterality: Bilateral;  . VAGINAL DELIVERY     Social History   Socioeconomic History  . Marital status: Widowed    Spouse name: Not on file  . Number of children: 2  . Years of education: Not on file  . Highest education level: Not on file  Occupational History  . Occupation: retired Museum/gallery exhibitions officer: RETIRED  Tobacco Use  . Smoking status: Former Research scientist (life sciences)  . Smokeless tobacco: Never Used  Substance and Sexual Activity  . Alcohol use: No    Alcohol/week: 0.0 standard drinks  . Drug use: No  . Sexual activity: Not Currently  Other Topics Concern  . Not on file  Social History Narrative   No living will   No health care POA but requests daughter-in-law [name] to do this   Would like attempts at resuscitation   No feeding tube if cognitively unaware   Social Determinants of Health   Financial Resource Strain: Not on  file  Food Insecurity: Not on file  Transportation Needs: Not on file  Physical Activity: Not on file  Stress: Not on file  Social Connections: Not on file   Family History  Problem Relation Age of Onset  . Heart disease Son        open heart surgery for a blockage  . Heart Problems Son   . Diabetes Son   . Parkinson's disease Son    Allergies  Allergen Reactions  . Duloxetine     REACTION: N/V Mental status change and trouble with balance  . Cymbalta [Duloxetine Hcl]     Unsure of reaction  . Maxitrol [Neomycin-Polymyxin-Dexameth]     Unsure of reaction  . Pregabalin     REACTION: SOB, swollen lips  . Tobradex [Tobramycin-Dexamethasone]     Unsure of reaction   Prior to Admission  medications   Medication Sig Start Date End Date Taking? Authorizing Provider  acyclovir (ZOVIRAX) 400 MG tablet Take 1 tablet (400 mg total) by mouth 4 (four) times daily. As needed if shingles flares up 08/17/19   Venia Carbon, MD  fexofenadine (ALLEGRA) 180 MG tablet Take 180 mg by mouth daily.    [provider]  fluticasone (FLONASE) 50 MCG/ACT nasal spray PLACE 2 SPRAYS INTO EACH NOSTRIL ONCE DAILY 03/05/19   Viviana Simpler I, MD  hydrocortisone 2.5 % cream Apply topically 3 (three) times daily as needed. 11/16/19   Viviana Simpler I, MD  hydrocortisone 2.5 % cream Apply to face, ears 1-2 times a day as directed. 06/07/20   Brendolyn Patty, MD  ketoconazole (NIZORAL) 2 % cream Apply to affected areas face and frontal scalp 1-2 times a day as directed. 06/07/20   Brendolyn Patty, MD  ketoconazole (NIZORAL) 2 % shampoo Massage into scalp and behind ears 1-2 times a week, let sit 10 minutes before rinsing. 06/07/20   Brendolyn Patty, MD  LORazepam (ATIVAN) 1 MG tablet TAKE 1/2 TO ONE TABLET BY MOUTH TWICE DAILY AS NEEDED FOR ANXIETY 05/09/20   Viviana Simpler I, MD  morphine (MSIR) 15 MG tablet TAKE 1 TABLET BY MOUTH EVERY 3 HOURS AS NEEDED FOR PAIN 05/09/20   Venia Carbon, MD  Multiple Vitamin (MULTIVITAMIN) tablet Take 1 tablet by mouth once daily    [provider]  NONFORMULARY OR COMPOUNDED ITEM See pharmacy note 03/05/19   Edrick Kins, DPM  pantoprazole (PROTONIX) 40 MG tablet Take 1 tablet (40 mg total) by mouth 2 (two) times daily. 11/16/19   Venia Carbon, MD  polyethylene glycol (MIRALAX / Floria Raveling) packet Take 17 g by mouth at bedtime.    [provider]  RESTASIS 0.05 % ophthalmic emulsion  02/24/18   [provider]  topiramate (TOPAMAX) 100 MG tablet TAKE 1 TABLET BY MOUTH TWICE DAILY 03/15/20   Venia Carbon, MD   DG Hip Unilat With Pelvis 2-3 Views Left  Result Date: 06/18/2020 CLINICAL DATA:  Left leg pain after fall. EXAM: DG HIP  (WITH OR WITHOUT PELVIS) 2-3V LEFT COMPARISON:  January 20, 2013. FINDINGS: Moderately displaced and comminuted fracture is seen involving the intertrochanteric region of the proximal left femur. Sclerotic focus is noted in the proximal left femur which was present on prior exam and is most consistent with benign enostosis. IMPRESSION: Moderately displaced and comminuted intertrochanteric fracture of proximal left femur. Electronically Signed   By: Marijo Conception M.D.   On: 06/18/2020 11:59    Positive ROS: All other systems have been  reviewed and were otherwise negative with the exception of those mentioned in the HPI and as above.  Physical Exam: General: Alert, no acute distress Cardiovascular: No pedal edema Respiratory: No cyanosis, no use of accessory musculature GI: No organomegaly, abdomen is soft and non-tender Skin: No lesions in the area of chief complaint Neurologic: Sensation intact distally Psychiatric: Patient is competent for consent with normal mood and affect Lymphatic: No axillary or cervical lymphadenopathy  MUSCULOSKELETAL: Patient extremely thin but alert and cooperative.  The left leg is shortened and rotated.  There is pain with movement of the left hip.  The skin is intact.  Neurovascular is good distally.  There is no evidence of any other orthopedic injury.  Assessment: Displaced, comminuted, intertrochanteric fracture left hip  Plan: Open reduction internal fixation left hip with trochanteric fixation nail later today.    Park Breed, MD [phone]   06/18/2020 1:12 PM

## 2020-06-19 ENCOUNTER — Inpatient Hospital Stay: Payer: PPO

## 2020-06-19 ENCOUNTER — Inpatient Hospital Stay: Payer: PPO | Admitting: Anesthesiology

## 2020-06-19 ENCOUNTER — Encounter
Admission: EM | Disposition: A | Discharge status: Skilled Nursing Facility | Payer: Self-pay | Source: Home / Self Care | Attending: Internal Medicine

## 2020-06-19 ENCOUNTER — Inpatient Hospital Stay (HOSPITAL_COMMUNITY)
Admit: 2020-06-19 | Discharge: 2020-06-19 | Disposition: A | Discharge status: Home / Self Care | Payer: PPO | Attending: Internal Medicine | Admitting: Internal Medicine

## 2020-06-19 DIAGNOSIS — I447 Left bundle-branch block, unspecified: Secondary | ICD-10-CM

## 2020-06-19 DIAGNOSIS — S72142D Displaced intertrochanteric fracture of left femur, subsequent encounter for closed fracture with routine healing: Secondary | ICD-10-CM

## 2020-06-19 DIAGNOSIS — Z01810 Encounter for preprocedural cardiovascular examination: Secondary | ICD-10-CM

## 2020-06-19 DIAGNOSIS — R55 Syncope and collapse: Secondary | ICD-10-CM

## 2020-06-19 HISTORY — PX: INTRAMEDULLARY (IM) NAIL INTERTROCHANTERIC: SHX5875

## 2020-06-19 LAB — MAGNESIUM: Magnesium: 1.9 mg/dL (ref 1.7–2.4)

## 2020-06-19 LAB — URINALYSIS, ROUTINE W REFLEX MICROSCOPIC
Bilirubin Urine: NEGATIVE
Glucose, UA: NEGATIVE mg/dL
Hgb urine dipstick: NEGATIVE
Ketones, ur: 5 mg/dL — AB
Leukocytes,Ua: NEGATIVE
Nitrite: NEGATIVE
Protein, ur: NEGATIVE mg/dL
Specific Gravity, Urine: 1.014 (ref 1.005–1.030)
pH: 6 (ref 5.0–8.0)

## 2020-06-19 LAB — ECHOCARDIOGRAM COMPLETE
AV Peak grad: 3.9 mmHg
Ao pk vel: 0.99 m/s
Area-P 1/2: 4.17 cm2
Height: 60 in
S' Lateral: 2.43 cm
Weight: 1296 oz

## 2020-06-19 LAB — CBC
HCT: 30.7 % — ABNORMAL LOW (ref 36.0–46.0)
Hemoglobin: 10.4 g/dL — ABNORMAL LOW (ref 12.0–15.0)
MCH: 32.4 pg (ref 26.0–34.0)
MCHC: 33.9 g/dL (ref 30.0–36.0)
MCV: 95.6 fL (ref 80.0–100.0)
Platelets: 87 10*3/uL — ABNORMAL LOW (ref 150–400)
RBC: 3.21 MIL/uL — ABNORMAL LOW (ref 3.87–5.11)
RDW: 13.9 % (ref 11.5–15.5)
WBC: 7.9 10*3/uL (ref 4.0–10.5)
nRBC: 0 % (ref 0.0–0.2)

## 2020-06-19 LAB — SURGICAL PCR SCREEN
MRSA, PCR: NEGATIVE
Staphylococcus aureus: POSITIVE — AB

## 2020-06-19 LAB — BASIC METABOLIC PANEL
Anion gap: 7 (ref 5–15)
BUN: 20 mg/dL (ref 8–23)
CO2: 24 mmol/L (ref 22–32)
Calcium: 8.3 mg/dL — ABNORMAL LOW (ref 8.9–10.3)
Chloride: 106 mmol/L (ref 98–111)
Creatinine, Ser: 0.78 mg/dL (ref 0.44–1.00)
GFR, Estimated: >60 mL/min (ref >60)
Glucose, Bld: 130 mg/dL — ABNORMAL HIGH (ref 70–99)
Potassium: 3.6 mmol/L (ref 3.5–5.1)
Sodium: 137 mmol/L (ref 135–145)

## 2020-06-19 LAB — PHOSPHORUS: Phosphorus: 3.7 mg/dL (ref 2.5–4.6)

## 2020-06-19 SURGERY — FIXATION, FRACTURE, INTERTROCHANTERIC, WITH INTRAMEDULLARY ROD
Anesthesia: General | Site: Hip | Laterality: Left

## 2020-06-19 MED ORDER — GENTAMICIN SULFATE 40 MG/ML IJ SOLN
INTRAMUSCULAR | Status: AC
  Filled 2020-06-19: qty 2

## 2020-06-19 MED ORDER — CEFAZOLIN SODIUM 1 G IJ SOLR
INTRAMUSCULAR | Status: AC
  Filled 2020-06-19: qty 10

## 2020-06-19 MED ORDER — FENTANYL CITRATE (PF) 100 MCG/2ML IJ SOLN
INTRAMUSCULAR | Status: AC
  Filled 2020-06-19: qty 2

## 2020-06-19 MED ORDER — PHENYLEPHRINE HCL (PRESSORS) 10 MG/ML IV SOLN
INTRAVENOUS | Status: DC | PRN
  Administered 2020-06-19 (×2): 100 ug via INTRAVENOUS

## 2020-06-19 MED ORDER — CEFAZOLIN SODIUM-DEXTROSE 2-4 GM/100ML-% IV SOLN
2.0000 g | INTRAVENOUS | Status: AC
  Administered 2020-06-19: 17:00:00 1 g via INTRAVENOUS
  Filled 2020-06-19: qty 100

## 2020-06-19 MED ORDER — FENTANYL CITRATE (PF) 100 MCG/2ML IJ SOLN
25.0000 ug | INTRAMUSCULAR | Status: DC | PRN
  Administered 2020-06-19 (×2): 25 ug via INTRAVENOUS

## 2020-06-19 MED ORDER — ONDANSETRON HCL 4 MG/2ML IJ SOLN
INTRAMUSCULAR | Status: AC
  Filled 2020-06-19: qty 2

## 2020-06-19 MED ORDER — FENTANYL CITRATE (PF) 100 MCG/2ML IJ SOLN
INTRAMUSCULAR | Status: AC
  Administered 2020-06-19: 18:00:00 25 ug via INTRAVENOUS
  Filled 2020-06-19: qty 2

## 2020-06-19 MED ORDER — LIDOCAINE HCL (CARDIAC) PF 100 MG/5ML IV SOSY
PREFILLED_SYRINGE | INTRAVENOUS | Status: DC | PRN
  Administered 2020-06-19: 60 mg via INTRAVENOUS

## 2020-06-19 MED ORDER — FENTANYL CITRATE (PF) 100 MCG/2ML IJ SOLN
INTRAMUSCULAR | Status: DC | PRN
  Administered 2020-06-19: 25 ug via INTRAVENOUS

## 2020-06-19 MED ORDER — ONDANSETRON HCL 4 MG/2ML IJ SOLN: 4.0000 mg | Freq: Once | INTRAMUSCULAR | Status: DC | PRN

## 2020-06-19 MED ORDER — SODIUM CHLORIDE 0.9 % IV SOLN: INTRAVENOUS | Status: DC

## 2020-06-19 MED ORDER — PROPOFOL 10 MG/ML IV BOLUS
INTRAVENOUS | Status: AC
  Filled 2020-06-19: qty 60

## 2020-06-19 MED ORDER — CHLORHEXIDINE GLUCONATE 4 % EX LIQD
1.0000 "application " | Freq: Once | CUTANEOUS | Status: AC
  Administered 2020-06-19: 1 via TOPICAL

## 2020-06-19 MED ORDER — BUPIVACAINE-EPINEPHRINE (PF) 0.25% -1:200000 IJ SOLN
INTRAMUSCULAR | Status: DC | PRN
  Administered 2020-06-19: 30 mL via PERINEURAL

## 2020-06-19 MED ORDER — ONDANSETRON HCL 4 MG/2ML IJ SOLN
INTRAMUSCULAR | Status: DC | PRN
  Administered 2020-06-19: 4 mg via INTRAVENOUS

## 2020-06-19 MED ORDER — SUGAMMADEX SODIUM 200 MG/2ML IV SOLN
INTRAVENOUS | Status: DC | PRN
  Administered 2020-06-19: 100 mg via INTRAVENOUS

## 2020-06-19 MED ORDER — SODIUM CHLORIDE (PF) 0.9 % IJ SOLN
INTRAMUSCULAR | Status: AC
  Filled 2020-06-19: qty 10

## 2020-06-19 MED ORDER — MORPHINE SULFATE (PF) 2 MG/ML IV SOLN
1.0000 mg | INTRAVENOUS | Status: DC | PRN
  Filled 2020-06-19: qty 1

## 2020-06-19 MED ORDER — BUPIVACAINE-EPINEPHRINE (PF) 0.25% -1:200000 IJ SOLN
INTRAMUSCULAR | Status: AC
  Filled 2020-06-19: qty 30

## 2020-06-19 MED ORDER — CLINDAMYCIN PHOSPHATE 600 MG/50ML IV SOLN
600.0000 mg | INTRAVENOUS | Status: AC
  Administered 2020-06-19: 17:00:00 600 mg via INTRAVENOUS
  Filled 2020-06-19: qty 50

## 2020-06-19 MED ORDER — ROCURONIUM BROMIDE 100 MG/10ML IV SOLN
INTRAVENOUS | Status: DC | PRN
  Administered 2020-06-19: 50 mg via INTRAVENOUS

## 2020-06-19 MED ORDER — PHENYLEPHRINE HCL-NACL 10-0.9 MG/250ML-% IV SOLN
INTRAVENOUS | Status: DC | PRN
  Administered 2020-06-19: 25 ug/min via INTRAVENOUS

## 2020-06-19 MED ORDER — PROPOFOL 10 MG/ML IV BOLUS
INTRAVENOUS | Status: DC | PRN
  Administered 2020-06-19: 20 mg via INTRAVENOUS
  Administered 2020-06-19: 90 mg via INTRAVENOUS

## 2020-06-19 SURGICAL SUPPLY — 43 items
BIT DRILL CALIBRATED 4.2 (BIT) IMPLANT
BLADE TFNA HELICAL 85 (Anchor) ×2 IMPLANT
BNDG COHESIVE 4X5 TAN STRL (GAUZE/BANDAGES/DRESSINGS) ×3 IMPLANT
CANISTER SUCT 1200ML W/VALVE (MISCELLANEOUS) ×3 IMPLANT
CHLORAPREP W/TINT 26 (MISCELLANEOUS) ×6 IMPLANT
COVER WAND RF STERILE (DRAPES) ×3 IMPLANT
DRAPE C-ARMOR (DRAPES) IMPLANT
DRAPE INCISE 23X17 IOBAN STRL (DRAPES) ×2
DRAPE INCISE 23X17 STRL (DRAPES) ×1 IMPLANT
DRAPE INCISE IOBAN 23X17 STRL (DRAPES) ×1 IMPLANT
DRILL BIT CALIBRATED 4.2 (BIT) ×3
DRSG AQUACEL AG ADV 3.5X10 (GAUZE/BANDAGES/DRESSINGS) IMPLANT
DRSG AQUACEL AG ADV 3.5X14 (GAUZE/BANDAGES/DRESSINGS) ×2 IMPLANT
ELECT REM PT RETURN 9FT ADLT (ELECTROSURGICAL) ×3
ELECTRODE REM PT RTRN 9FT ADLT (ELECTROSURGICAL) ×1 IMPLANT
GAUZE SPONGE 4X4 12PLY STRL (GAUZE/BANDAGES/DRESSINGS) ×3 IMPLANT
GAUZE XEROFORM 1X8 LF (GAUZE/BANDAGES/DRESSINGS) IMPLANT
GLOVE INDICATOR 8.0 STRL GRN (GLOVE) ×3 IMPLANT
GLOVE SURG ORTHO 8.5 STRL (GLOVE) ×3 IMPLANT
GOWN STRL REUS W/ TWL LRG LVL3 (GOWN DISPOSABLE) ×1 IMPLANT
GOWN STRL REUS W/TWL LRG LVL3 (GOWN DISPOSABLE) ×3
GOWN STRL REUS W/TWL LRG LVL4 (GOWN DISPOSABLE) ×3 IMPLANT
GUIDEWIRE 3.2X400 (WIRE) ×2 IMPLANT
IMPL DEG TI CANN 11MM/130 (Orthopedic Implant) IMPLANT
IMPLANT DEG TI CANN 11MM/130 (Orthopedic Implant) ×3 IMPLANT
KIT TURNOVER KIT A (KITS) ×3 IMPLANT
MANIFOLD NEPTUNE II (INSTRUMENTS) ×3 IMPLANT
MAT ABSORB  FLUID 56X50 GRAY (MISCELLANEOUS) ×3
MAT ABSORB FLUID 56X50 GRAY (MISCELLANEOUS) ×1 IMPLANT
NDL SPNL 18GX3.5 QUINCKE PK (NEEDLE) ×1 IMPLANT
NEEDLE SPNL 18GX3.5 QUINCKE PK (NEEDLE) ×3 IMPLANT
NS IRRIG 500ML POUR BTL (IV SOLUTION) ×3 IMPLANT
PACK HIP COMPR (MISCELLANEOUS) ×3 IMPLANT
SCREW LOCK STAR 5X32 (Screw) ×2 IMPLANT
SOL PREP PVP 2OZ (MISCELLANEOUS) ×3
SOLUTION PREP PVP 2OZ (MISCELLANEOUS) ×1 IMPLANT
STAPLER SKIN PROX 35W (STAPLE) ×3 IMPLANT
SUCTION FRAZIER HANDLE 10FR (MISCELLANEOUS) ×3
SUCTION TUBE FRAZIER 10FR DISP (MISCELLANEOUS) ×1 IMPLANT
SUT VIC AB 0 CT1 36 (SUTURE) ×3 IMPLANT
SUT VIC AB 2-0 CT1 27 (SUTURE) ×3
SUT VIC AB 2-0 CT1 TAPERPNT 27 (SUTURE) ×1 IMPLANT
SYR 30ML LL (SYRINGE) ×3 IMPLANT

## 2020-06-19 NOTE — Anesthesia Procedure Notes (Signed)
Procedure Name: Intubation Date/Time: 06/19/2020 4:22 PM Performed by: Lowry Bowl, CRNA Pre-anesthesia Checklist: Patient identified, Emergency Drugs available, Suction available and Patient being monitored Patient Re-evaluated:Patient Re-evaluated prior to induction Oxygen Delivery Method: Circle system utilized Preoxygenation: Pre-oxygenation with 100% oxygen Induction Type: IV induction, Cricoid Pressure applied and Rapid sequence Ventilation: Mask ventilation without difficulty Laryngoscope Size: 3 and McGraph Grade View: Grade I Tube type: Oral Tube size: 6.0 mm Number of attempts: 1 Airway Equipment and Method: Stylet and Video-laryngoscopy Placement Confirmation: ETT inserted through vocal cords under direct vision,  positive ETCO2 and breath sounds checked- equal and bilateral Secured at: 20 cm Tube secured with: Tape Dental Injury: Teeth and Oropharynx as per pre-operative assessment

## 2020-06-19 NOTE — Anesthesia Preprocedure Evaluation (Signed)
Anesthesia Evaluation  Patient identified by MRN, date of birth, ID band Patient awake    Reviewed: Allergy & Precautions, NPO status , Patient's Chart, lab work & pertinent test results  History of Anesthesia Complications Negative for: history of anesthetic complications  Airway Mallampati: II       Dental   Pulmonary neg sleep apnea, neg COPD, Not current smoker, former smoker,           Cardiovascular hypertension, Pt. on medications (-) Past MI and (-) CHF      Neuro/Psych neg Seizures Depression R eye sewn shut to prevent optic nerve damage CVA, No Residual Symptoms    GI/Hepatic Neg liver ROS, GERD  ,  Endo/Other  neg diabetes  Renal/GU negative Renal ROS     Musculoskeletal   Abdominal   Peds  Hematology   Anesthesia Other Findings   Reproductive/Obstetrics                             Anesthesia Physical Anesthesia Plan  ASA: III  Anesthesia Plan: General   Post-op Pain Management:    Induction: Intravenous  PONV Risk Score and Plan: 3 and Ondansetron, Dexamethasone and Treatment may vary due to age or medical condition  Airway Management Planned: Oral ETT  Additional Equipment:   Intra-op Plan:   Post-operative Plan:   Informed Consent: I have reviewed the patients History and Physical, chart, labs and discussed the procedure including the risks, benefits and alternatives for the proposed anesthesia with the patient or authorized representative who has indicated his/her understanding and acceptance.       Plan Discussed with:   Anesthesia Plan Comments:         Anesthesia Quick Evaluation

## 2020-06-19 NOTE — Consult Note (Addendum)
Cardiology Consultation:   Patient ID: [name] MRN: [id number]; DOB: 1927-07-21  Admit date: 06/18/2020 Date of Consult: 06/19/2020  Primary Care Provider: Venia Carbon, MD [facility] Cardiologist: No primary care provider on file.  None [facility] Electrophysiologist:  None    Patient Profile:   [name] is a 86 y.o. female with a hx of hypertension and gastroesophageal reflux disease, who is being seen today for the evaluation of preoperative evaluation at the request of Dr. Mal Misty.  History of Present Illness:   [name] is a very pleasant but frail 86 year old woman with a history of shingles with loss of the right eye and ongoing neuralgia, hypertension and reflux disease and depression, who fell yesterday in her assisted living facility and broke her hip.  She thinks that she may have passed out initially leading to the fall but is not sure.  There is no history of prior syncope.  Approximately 1 year ago, she had a negative stress test with preserved left ventricular systolic function, being done for atypical chest pain.  The patient has no functional limitation with regard to physical activity.  She denies chest pain or shortness of breath with exertion.  She denies peripheral edema.  Review of her old EKGs demonstrates sinus rhythm with poor R wave progression, incomplete left bundle branch block.  Her current EKG is unchanged.   Past Medical History:  Diagnosis Date  . Arrhythmia   . Arthritis   . Atypical chest pain    Lexiscan myoview (5/11) with EF 84%, normal wall motion, small fixed apical  perfusion defect likely due to breast attenuation, no evidence for ischemia or infarction.  **Patient had an  . Basal cell carcinoma 07/28/19 [facility]   R pretibial  . Breast cancer ([place])    Bilateral mastectomies 1986.  [name] CVA (cerebral infarction) 10/12   right lacunar  . Depression   . Dyspnea    Echo (5/11) was a difficult study due to breast implants but  showed normal LV and RV size and systolic function.      [name] GERD (gastroesophageal reflux disease)   . Headache    s/p shingles - right side of head  . History of partial thyroidectomy   . Hyperlipidemia   . Hypertension    in past - no current meds/issues  . Obstruction of intestine ([place])   . Partial small bowel obstruction ([place]) 7/14   no surgery  . Superficial basal cell carcinoma 06/18/2019   R mid to distal pretibial  . Wears dentures    full upper  . Zoster     with Post-herpetic neuralgia    Past Surgical History:  Procedure Laterality Date  . ABDOMINAL HYSTERECTOMY    . BREAST SURGERY Bilateral    cancer - mastectomy and implant insertion  . MASTECTOMY  1980   bilateral  . NECK SURGERY    . TARSORRHAPHY Bilateral 08/16/2015   Procedure: MINOR TARSORRAPHY LATERAL PLACEMENT;  Surgeon: Karle Starch, MD;  Location: [facility];  Service: Ophthalmology;  Laterality: Bilateral;  . VAGINAL DELIVERY       Home Medications:  Prior to Admission medications   Medication Sig Start Date End Date Taking? Authorizing Provider  fluticasone (FLONASE) 50 MCG/ACT nasal spray PLACE 2 SPRAYS INTO EACH NOSTRIL ONCE DAILY Patient taking differently: Place 2 sprays into both nostrils daily. 03/05/19  Yes Venia Carbon, MD  acyclovir (ZOVIRAX) 400 MG tablet Take 1 tablet (400 mg total) by mouth  4 (four) times daily. As needed if shingles flares up Patient not taking: Reported on 06/18/2020 08/17/19   Viviana Simpler I, MD  aspirin 81 MG EC tablet Take 81 mg by mouth daily.    [provider]  azelastine (ASTELIN) 0.1 % nasal spray Place 2 sprays into both nostrils 2 (two) times daily. 05/20/20   [provider]  fexofenadine (ALLEGRA) 180 MG tablet Take 180 mg by mouth daily.    [provider]  hydrocortisone 2.5 % cream Apply topically 3 (three) times daily as needed. 11/16/19   Viviana Simpler I, MD  hydrocortisone 2.5 % cream Apply to face, ears 1-2 times  a day as directed. 06/07/20   Brendolyn Patty, MD  ketoconazole (NIZORAL) 2 % cream Apply to affected areas face and frontal scalp 1-2 times a day as directed. 06/07/20   Brendolyn Patty, MD  ketoconazole (NIZORAL) 2 % shampoo Massage into scalp and behind ears 1-2 times a week, let sit 10 minutes before rinsing. 06/07/20   Brendolyn Patty, MD  LORazepam (ATIVAN) 1 MG tablet TAKE 1/2 TO ONE TABLET BY MOUTH TWICE DAILY AS NEEDED FOR ANXIETY Patient taking differently: Take 0.5-1 mg by mouth 2 (two) times daily. TAKE 1/2 TO ONE TABLET BY MOUTH TWICE DAILY AS NEEDED FOR ANXIETY 05/09/20   Viviana Simpler I, MD  morphine (MSIR) 15 MG tablet TAKE 1 TABLET BY MOUTH EVERY 3 HOURS AS NEEDED FOR PAIN Patient taking differently: Take 15 mg by mouth every 3 (three) hours as needed for severe pain. TAKE 1 TABLET BY MOUTH EVERY 3 HOURS AS NEEDED FOR PAIN 05/09/20   Venia Carbon, MD  Multiple Vitamin (MULTIVITAMIN) tablet Take 1 tablet by mouth once daily    [provider]  NONFORMULARY OR COMPOUNDED ITEM See pharmacy note 03/05/19   Edrick Kins, DPM  pantoprazole (PROTONIX) 40 MG tablet Take 1 tablet (40 mg total) by mouth 2 (two) times daily. 11/16/19   Venia Carbon, MD  polyethylene glycol (MIRALAX / Floria Raveling) packet Take 17 g by mouth at bedtime.    [provider]  RESTASIS 0.05 % ophthalmic emulsion Place 1 drop into both eyes 2 (two) times daily. 02/24/18   [provider]  topiramate (TOPAMAX) 100 MG tablet TAKE 1 TABLET BY MOUTH TWICE DAILY Patient taking differently: Take 100 mg by mouth 2 (two) times daily. 03/15/20   Venia Carbon, MD    Inpatient Medications: Scheduled Meds: . amLODipine  5 mg Oral Daily  . cycloSPORINE  1 drop Both Eyes BID  . fluticasone  2 spray Each Nare Daily  . loratadine  10 mg Oral Daily  . LORazepam  0.5 mg Oral BID  . multivitamin with minerals  1 tablet Oral Daily  . pantoprazole  40 mg Oral BID  . polyethylene glycol  17 g Oral QHS   . senna  1 tablet Oral BID  . topiramate  100 mg Oral BID   Continuous Infusions: . methocarbamol (ROBAXIN) IV     PRN Meds: fentaNYL (SUBLIMAZE) injection, HYDROcodone-acetaminophen, methocarbamol **OR** methocarbamol (ROBAXIN) IV, morphine injection, ondansetron (ZOFRAN) IV  Allergies:    Allergies  Allergen Reactions  . Duloxetine     REACTION: N/V Mental status change and trouble with balance  . Cymbalta [Duloxetine Hcl]     Unsure of reaction  . Maxitrol [Neomycin-Polymyxin-Dexameth]     Unsure of reaction  . Pregabalin     REACTION: SOB, swollen lips  . Tobradex [Tobramycin-Dexamethasone]  Unsure of reaction    Social History:   Social History   Socioeconomic History  . Marital status: Widowed    Spouse name: Not on file  . Number of children: 2  . Years of education: Not on file  . Highest education level: Not on file  Occupational History  . Occupation: retired Museum/gallery exhibitions officer: RETIRED  Tobacco Use  . Smoking status: Former Research scientist (life sciences)  . Smokeless tobacco: Never Used  Substance and Sexual Activity  . Alcohol use: No    Alcohol/week: 0.0 standard drinks  . Drug use: No  . Sexual activity: Not Currently  Other Topics Concern  . Not on file  Social History Narrative   No living will   No health care POA but requests daughter-in-law [name] to do this   Would like attempts at resuscitation   No feeding tube if cognitively unaware   Social Determinants of Health   Financial Resource Strain: Not on file  Food Insecurity: Not on file  Transportation Needs: Not on file  Physical Activity: Not on file  Stress: Not on file  Social Connections: Not on file  Intimate Partner Violence: Not on file    Family History:    Family History  Problem Relation Age of Onset  . Heart disease Son        open heart surgery for a blockage  . Heart Problems Son   . Diabetes Son   . Parkinson's disease Son      ROS:  Please see the history of present  illness.   All other ROS reviewed and negative.     Physical Exam/Data:   Vitals:   06/18/20 1958 06/19/20 0001 06/19/20 0428 06/19/20 0753  BP: (!) 154/58 140/66 (!) 123/99 (!) 158/57  Pulse: 89 75 80 84  Resp: 17 20 16 15   Temp: 99.3 F (37.4 C) 98.8 F (37.1 C) 98.4 F (36.9 C) 98.4 F (36.9 C)  TempSrc: Oral Oral Oral Oral  SpO2: 95% 94% 97% 97%  Weight:      Height:        Intake/Output Summary (Last 24 hours) at 06/19/2020 0811 Last data filed at 06/18/2020 2100 Gross per 24 hour  Intake --  Output 200 ml  Net -200 ml   Last 3 Weights 06/18/2020 02/22/2020 11/16/2019  Weight (lbs) 81 lb 82 lb 81 lb  Weight (kg) 36.741 kg 37.195 kg 36.741 kg     Body mass index is 15.82 kg/m.  General:  Well nourished, well developed, in no acute distress HEENT: normal, except right eye absent Lymph: no adenopathy Neck: no JVD Endocrine:  No thryomegaly Vascular: No carotid bruits; FA pulses 2+ bilaterally without bruits  Cardiac:  normal S1, S2; RRR; no murmur  Lungs:  clear to auscultation bilaterally, no wheezing, rhonchi or rales  Abd: soft, nontender, no hepatomegaly  Ext: no edema Musculoskeletal:  No deformities, BUE and BLE strength normal and equal Skin: warm and dry  Neuro:  CNs 2-12 intact, no focal abnormalities noted Psych:  Normal affect   EKG:  The EKG was personally reviewed and demonstrates: Normal sinus rhythm with incomplete left bundle branch block Telemetry:  Telemetry was personally reviewed and demonstrates: Normal sinus rhythm  Relevant CV Studies: None  Laboratory Data:  High Sensitivity Troponin:  No results for input(s): TROPONINIHS in the last 720 hours.   Chemistry Recent Labs  Lab 06/18/20 1109 06/19/20 0505  NA 140 137  K 3.8 3.6  CL 108  106  CO2 23 24  GLUCOSE 139* 130*  BUN 23 20  CREATININE 0.70 0.78  CALCIUM 8.7* 8.3*  GFRNONAA >60 >60  ANIONGAP 9 7    No results for input(s): PROT, ALBUMIN, AST, ALT, ALKPHOS, BILITOT in  the last 168 hours. Hematology Recent Labs  Lab 06/18/20 1109 06/19/20 0505  WBC 8.6 7.9  RBC 3.81* 3.21*  HGB 12.2 10.4*  HCT 37.6 30.7*  MCV 98.7 95.6  MCH 32.0 32.4  MCHC 32.4 33.9  RDW 13.6 13.9  PLT 130* 87*   BNPNo results for input(s): BNP, PROBNP in the last 168 hours.  DDimer No results for input(s): DDIMER in the last 168 hours.   Radiology/Studies:  DG Hip Unilat With Pelvis 2-3 Views Left  Result Date: 06/18/2020 CLINICAL DATA:  Left leg pain after fall. EXAM: DG HIP (WITH OR WITHOUT PELVIS) 2-3V LEFT COMPARISON:  January 20, 2013. FINDINGS: Moderately displaced and comminuted fracture is seen involving the intertrochanteric region of the proximal left femur. Sclerotic focus is noted in the proximal left femur which was present on prior exam and is most consistent with benign enostosis. IMPRESSION: Moderately displaced and comminuted intertrochanteric fracture of proximal left femur. Electronically Signed   By: Marijo Conception M.D.   On: 06/18/2020 11:59     Assessment and Plan:   1. Preoperative evaluation -the patient is low risk for cardiovascular complications from hip surgery.  I would recommend telemetry postoperatively.  We will follow the patient along 2. Possible syncope -it is unclear whether the patient passed out or not.  She has a incomplete left bundle branch block on her EKG and for this reason I would suggest outpatient Zio patch monitoring with cardiology follow-up she has a history of preserved left ventricular systolic function and no ischemia by stress testing less than a year ago.     For questions or updates, please contact Daisy Please consult [email or web address] for contact info under    Signed, Cristopher Peru, MD  06/19/2020 8:11 AM   Cardiology Addendum  The patient has had a 2D echo ordered which I would not have recommended. However, I have reviewed the echo and her EF is normal as is her RV function and there is no large  effusion.  Carleene Overlie Symiah Nowotny,MD

## 2020-06-19 NOTE — H&P (Signed)
THE PATIENT WAS SEEN PRIOR TO SURGERY TODAY.  HISTORY, ALLERGIES, HOME MEDICATIONS AND OPERATIVE PROCEDURE WERE REVIEWED. RISKS AND BENEFITS OF SURGERY DISCUSSED WITH PATIENT AGAIN.  NO CHANGES FROM INITIAL HISTORY AND PHYSICAL NOTED.    

## 2020-06-19 NOTE — Anesthesia Postprocedure Evaluation (Signed)
Anesthesia Post Note  Patient: [name]  Procedure(s) Performed: INTRAMEDULLARY (IM) NAIL INTERTROCHANTRIC (Left Hip)  Patient location during evaluation: PACU Anesthesia Type: General Level of consciousness: awake and alert Pain management: pain level controlled Vital Signs Assessment: post-procedure vital signs reviewed and stable Respiratory status: spontaneous breathing and respiratory function stable Cardiovascular status: stable Anesthetic complications: no   No complications documented.   Last Vitals:  Vitals:   06/19/20 1836 06/19/20 2029  BP: (!) 163/59 (!) 148/63  Pulse: 74 77  Resp: 16 18  Temp: 36.9 C 36.8 C  SpO2: 97% 98%    Last Pain:  Vitals:   06/19/20 2029  TempSrc: Oral  PainSc:                  [name]

## 2020-06-19 NOTE — Transfer of Care (Signed)
Immediate Anesthesia Transfer of Care Note  Patient: [name]  Procedure(s) Performed: INTRAMEDULLARY (IM) NAIL INTERTROCHANTRIC (Left Hip)  Patient Location: PACU  Anesthesia Type:General  Level of Consciousness: awake, drowsy and patient cooperative  Airway & Oxygen Therapy: Patient Spontanous Breathing  Post-op Assessment: Report given to RN and Post -op Vital signs reviewed and stable  Post vital signs: Reviewed and stable  Last Vitals:  Vitals Value Taken Time  BP 170/67 06/19/20 1731  Temp 37.5 C 06/19/20 1731  Pulse 88 06/19/20 1734  Resp 16 06/19/20 1734  SpO2 99 % 06/19/20 1734  Vitals shown include unvalidated device data.  Last Pain:  Vitals:   06/19/20 1142  TempSrc: Oral  PainSc: 0-No pain         Complications: No complications documented.

## 2020-06-19 NOTE — TOC Progression Note (Signed)
Transition of Care [facility]) - Progression Note    Patient Details  Name: [name] MRN: [id number] Date of Birth: Nov 25, 1927  [facility] CM/SW Contact  Izola Price, RN Phone Number: 06/19/2020, 2:59 PM  Clinical Narrative:   12/12 1500  New TOC consult acknowledged on new 12/11 admit for SNF placement.  Fall at home due to "black out" with partially displaced Left Hip Fx.   Surgery 12/12, could not see patient.   No physical limitations per H&P.   Daughter IL helps patient PTA or prior fall per provider notes.   Viviana Simpler PCP  RX: [facility], [place]         Expected Discharge Plan and Services                                                 Social Determinants of Health (SDOH) Interventions    Readmission Risk Interventions No flowsheet data found.

## 2020-06-19 NOTE — Op Note (Signed)
DATE OF SURGERY:  06/19/2020  TIME: 5:33 PM  PATIENT NAME:  [name]  AGE: 86 y.o.  PRE-OPERATIVE DIAGNOSIS:  fractured hip left intertrochanteric   POST-OPERATIVE DIAGNOSIS:  SAME  PROCEDURE:  INTRAMEDULLARY (IM) NAIL INTERTROCHANTRIC left hip  SURGEON:  Park Breed  ASST:  EBL:  15 cc  COMPLICATIONS:  None  OPERATIVE IMPLANTS: Synthes trochanteric femoral nail  13*/27mm  with interlocking helical blade  85 mm and distal locking screw  32 mm.  PREOPERATIVE INDICATIONS:  [name] is a 86 y.o. year old who fell and suffered a hip fracture. She was brought into the ER and then admitted and optimized and then elected for surgical intervention.    The risks benefits and alternatives were discussed with the patient including but not limited to the risks of nonoperative treatment, versus surgical intervention including infection, bleeding, nerve injury, malunion, nonunion, hardware prominence, hardware failure, need for hardware removal, blood clots, cardiopulmonary complications, morbidity, mortality, among others, and they were willing to proceed.    OPERATIVE PROCEDURE:  The patient was brought to the operating room and placed in the supine position.  General endotracheal anesthesia was administered, with a foley. She was placed on the fracture table.  Closed reduction was performed under C-arm guidance. The length of the femur was also measured using fluoroscopy. Time out was then performed after sterile prep and drape. She received preoperative antibiotics.  Incision was made proximal to the greater trochanter. A guidewire was placed in the appropriate position. Confirmation was made on AP and lateral views. The above-named nail was opened. I opened the proximal femur with a reamer. I then placed the nail by hand easily down. I did not need to ream the femur.  Once the nail was completely seated, I placed a guidepin into the femoral head into the center center  position through a second incision.  I measured the length, and then reamed the lateral cortex and up into the head. I then placed the helical blade. Slight compression was applied. Anatomic fixation achieved. Bone quality was mediocre.  I then secured the proximal interlock.  The distal locking screw was then placed and after confirming the position of the fracture fragments and hardware I then removed the instruments, and took final C-arm pictures AP and lateral the entire length of the leg. Anatomic reconstruction was achieved, and the wounds were irrigated copiously and closed with Vicryl  followed by staples and dry sterile dressing. Sponge and needle count were correct.   The patient was awakened and returned to PACU in stable and satisfactory condition. There no complications and the patient tolerated the procedure well.  She will be partial weightbearing as tolerated, and will be on Lovenox  For DVT prophylaxis.     Park Breed, M.D.

## 2020-06-19 NOTE — Progress Notes (Addendum)
Progress Note    [name]  DPO:[id number] DOB: 09/15/1927  DOA: 06/18/2020 PCP: Elizabeth Carbon, MD      Brief Narrative:    Medical records reviewed and are as summarized below:  [name] is a 86 y.o. 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  She was brought to the hospital after a fall at home.  Reportedly, she blacked out and fell.  She uses a cane at home and apparently she had her cane with her when she fell.  She was found to have moderately displaced and comminuted intertrochanteric fracture of the proximal left femur.    Assessment/Plan:   Principal Problem:   Intertrochanteric fracture of left femur [facility]) Active Problems:   GERD   Hypertension   Depression   Fall at home, initial encounter    Body mass index is 15.82 kg/m.  (Underweight)   Left intertrochanteric fracture of left femur, s/p fall: Continue analgesics as needed for pain.  Plan for left hip surgery today.  Follow-up with orthopedic surgeon.  S/p ?syncope: Official report of 2D echo is pending.  Patient has been evaluated by the cardiologist and there is no acute contraindication to surgery.  Outpatient follow-up for Zio patch cardiac monitoring is recommended.  Hypertension: She is on amlodipine from home.  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 Sabra Heck  Procedures:  Plan for left hip surgery today    Medications:   . amLODipine  5 mg Oral Daily  . cycloSPORINE  1 drop Both Eyes BID  . fluticasone  2 spray  Each Nare Daily  . loratadine  10 mg Oral Daily  . LORazepam  0.5 mg Oral BID  . multivitamin with minerals  1 tablet Oral Daily  . pantoprazole  40 mg Oral BID  . polyethylene glycol  17 g Oral QHS  . senna  1 tablet Oral BID  . topiramate  100 mg Oral BID   Continuous Infusions: . sodium chloride 75 mL/hr at 06/19/20 0948  .  ceFAZolin (ANCEF) IV    . clindamycin (CLEOCIN) IV    . methocarbamol (ROBAXIN) IV       Anti-infectives (From admission, onward)   Start     Dose/Rate Route Frequency Ordered Stop   06/19/20 1015  ceFAZolin (ANCEF) IVPB 2g/100 mL premix        2 g 200 mL/hr over 30 Minutes Intravenous On call to O.R. 06/19/20 0917 06/20/20 0559   06/19/20 1015  clindamycin (CLEOCIN) IVPB 600 mg        600 mg 100 mL/hr over 30 Minutes Intravenous On call to O.R. 06/19/20 0917 06/20/20 0559             Family Communication/Anticipated D/C date and plan/Code Status   DVT prophylaxis: SCDs Start: 06/18/20 1316     Code Status: Full Code  Family Communication: Plan discussed with Ms. Ardelle Haliburton, daughter-in-law, at the bedside Disposition Plan:  Status is: Inpatient  Remains inpatient appropriate because:Unsafe d/c plan and Inpatient level of care appropriate due to severity of illness   Dispo: The patient is from: Home              Anticipated d/c is to: SNF              Anticipated d/c date is: 3 days              Patient currently is not medically stable to d/c.           Subjective:   Interval events noted.  She only has mild pain in the left hip.  No dizziness, shortness of breath, palpitations or chest pain.  Objective:    Vitals:   06/19/20 0001 06/19/20 0428 06/19/20 0753 06/19/20 1142  BP: 140/66 (!) 123/99 (!) 158/57 (!) 125/52  Pulse: 75 80 84 80  Resp: 20 16 15 16   Temp: 98.8 F (37.1 C) 98.4 F (36.9 C) 98.4 F (36.9 C) 98.5 F (36.9 C)  TempSrc: Oral Oral Oral Oral  SpO2: 94% 97% 97% 95%  Weight:      Height:        No data found.   Intake/Output Summary (Last 24 hours) at 06/19/2020 1159 Last data filed at 06/18/2020 2100 Gross per 24 hour  Intake --  Output 200 ml  Net -200 ml   Filed Weights   06/18/20 1105  Weight: 36.7 kg    Exam:  GEN: NAD SKIN: Warm and dry EYES: Abnormal right eye with blindness in right eye ENT: MMM CV: RRR PULM: CTA B ABD: soft, ND, NT, +BS CNS: AAO x 3, non focal EXT: Mild left hip tenderness   Data Reviewed:   I have personally reviewed following labs and imaging studies:  Labs: Labs show the following:   Basic Metabolic Panel: Recent Labs  Lab 06/18/20 1109 06/19/20 0505  NA 140 137  K 3.8 3.6  CL 108 106  CO2 23 24  GLUCOSE 139* 130*  BUN 23 20  CREATININE 0.70 0.78  CALCIUM 8.7* 8.3*   GFR Estimated Creatinine Clearance: 27.1 mL/min (by C-G formula based on SCr of 0.78 mg/dL). Liver Function Tests: No results for input(s): AST, ALT, ALKPHOS, BILITOT, PROT, ALBUMIN in the last 168 hours. No results for input(s): LIPASE, AMYLASE in the last 168 hours. No results for input(s): AMMONIA in the last 168 hours. Coagulation profile Recent Labs  Lab 06/18/20 1109  INR 1.1    CBC: Recent Labs  Lab 06/18/20 1109 06/19/20 0505  WBC 8.6 7.9  NEUTROABS 6.2  --   HGB 12.2 10.4*  HCT 37.6 30.7*  MCV 98.7 95.6  PLT 130* 87*   Cardiac Enzymes: No results for input(s): CKTOTAL, CKMB, CKMBINDEX, TROPONINI in the last 168 hours. BNP (last 3 results) No results for input(s): PROBNP in the last 8760 hours. CBG: No results for input(s): GLUCAP in the last 168 hours. D-Dimer: No results for input(s): DDIMER in the last 72 hours. Hgb A1c: No results for input(s): HGBA1C in the last 72 hours. Lipid Profile: No results for input(s): CHOL, HDL, LDLCALC, TRIG, CHOLHDL, LDLDIRECT in the last 72 hours. Thyroid function studies: No results for input(s): TSH, T4TOTAL, T3FREE, THYROIDAB in the last 72 hours.  Invalid input(s):  FREET3 Anemia work up: No results for input(s): VITAMINB12, FOLATE, FERRITIN, TIBC, IRON, RETICCTPCT in the last 72 hours. Sepsis Labs: Recent Labs  Lab 06/18/20 1109 06/19/20 0505  WBC 8.6 7.9  Microbiology Recent Results (from the past 240 hour(s))  Resp Panel by RT-PCR (Flu A&B, Covid) Nasopharyngeal Swab     Status: None   Collection Time: 06/18/20 11:09 AM   Specimen: Nasopharyngeal Swab; Nasopharyngeal(NP) swabs in vial transport medium  Result Value Ref Range Status   SARS Coronavirus 2 by RT PCR NEGATIVE NEGATIVE Final    Comment: (NOTE) SARS-CoV-2 target nucleic acids are NOT DETECTED.  The SARS-CoV-2 RNA is generally detectable in upper respiratory specimens during the acute phase of infection. The lowest concentration of SARS-CoV-2 viral copies this assay can detect is 138 copies/mL. A negative result does not preclude SARS-Cov-2 infection and should not be used as the sole basis for treatment or other patient management decisions. A negative result may occur with  improper specimen collection/handling, submission of specimen other than nasopharyngeal swab, presence of viral mutation(s) within the areas targeted by this assay, and inadequate number of viral copies(<138 copies/mL). A negative result must be combined with clinical observations, patient history, and epidemiological information. The expected result is Negative.  Fact Sheet for Patients:  EntrepreneurPulse.com.au  Fact Sheet for Healthcare Providers:  IncredibleEmployment.be  This test is no t yet approved or cleared by the [place] FDA and  has been authorized for detection and/or diagnosis of SARS-CoV-2 by FDA under an Emergency Use Authorization (EUA). This EUA will remain  in effect (meaning this test can be used) for the duration of the COVID-19 declaration under Section 564(b)(1) of the Act, 21 U.S.C.section 360bbb-3(b)(1), unless the authorization is  terminated  or revoked sooner.       Influenza A by PCR NEGATIVE NEGATIVE Final   Influenza B by PCR NEGATIVE NEGATIVE Final    Comment: (NOTE) The Xpert Xpress SARS-CoV-2/FLU/RSV plus assay is intended as an aid in the diagnosis of influenza from Nasopharyngeal swab specimens and should not be used as a sole basis for treatment. Nasal washings and aspirates are unacceptable for Xpert Xpress SARS-CoV-2/FLU/RSV testing.  Fact Sheet for Patients: EntrepreneurPulse.com.au  Fact Sheet for Healthcare Providers: IncredibleEmployment.be  This test is not yet approved or cleared by the [place] FDA and has been authorized for detection and/or diagnosis of SARS-CoV-2 by FDA under an Emergency Use Authorization (EUA). This EUA will remain in effect (meaning this test can be used) for the duration of the COVID-19 declaration under Section 564(b)(1) of the Act, 21 U.S.C. section 360bbb-3(b)(1), unless the authorization is terminated or revoked.  Performed at [facility], [address] [place], [place] [address]     Procedures and diagnostic studies:  DG Hip Unilat With Pelvis 2-3 Views Left  Result Date: 06/18/2020 CLINICAL DATA:  Left leg pain after fall. EXAM: DG HIP (WITH OR WITHOUT PELVIS) 2-3V LEFT COMPARISON:  January 20, 2013. FINDINGS: Moderately displaced and comminuted fracture is seen involving the intertrochanteric region of the proximal left femur. Sclerotic focus is noted in the proximal left femur which was present on prior exam and is most consistent with benign enostosis. IMPRESSION: Moderately displaced and comminuted intertrochanteric fracture of proximal left femur. Electronically Signed   By: Marijo Conception M.D.   On: 06/18/2020 11:59               LOS: 1 day   Jenesys Casseus  [facility]   Pager on [email or web address]. If 7PM-7AM, please contact night-coverage at [email or web address]     06/19/2020,  11:59 AM

## 2020-06-19 NOTE — Progress Notes (Signed)
*  PRELIMINARY RESULTS* Echocardiogram 2D Echocardiogram has been performed.  [name] [name] 06/19/2020, 10:44 AM

## 2020-06-20 ENCOUNTER — Encounter: Payer: Self-pay | Admitting: Specialist

## 2020-06-20 DIAGNOSIS — E43 Unspecified severe protein-calorie malnutrition: Secondary | ICD-10-CM | POA: Insufficient documentation

## 2020-06-20 LAB — CBC WITH DIFFERENTIAL/PLATELET
Abs Immature Granulocytes: 0.03 10*3/uL (ref 0.00–0.07)
Basophils Absolute: 0 10*3/uL (ref 0.0–0.1)
Basophils Relative: 0 %
Eosinophils Absolute: 0 10*3/uL (ref 0.0–0.5)
Eosinophils Relative: 0 %
HCT: 25.1 % — ABNORMAL LOW (ref 36.0–46.0)
Hemoglobin: 8.1 g/dL — ABNORMAL LOW (ref 12.0–15.0)
Immature Granulocytes: 0 %
Lymphocytes Relative: 10 %
Lymphs Abs: 0.8 10*3/uL (ref 0.7–4.0)
MCH: 31.6 pg (ref 26.0–34.0)
MCHC: 32.3 g/dL (ref 30.0–36.0)
MCV: 98 fL (ref 80.0–100.0)
Monocytes Absolute: 0.9 10*3/uL (ref 0.1–1.0)
Monocytes Relative: 11 %
Neutro Abs: 6.5 10*3/uL (ref 1.7–7.7)
Neutrophils Relative %: 79 %
Platelets: 66 10*3/uL — ABNORMAL LOW (ref 150–400)
RBC: 2.56 MIL/uL — ABNORMAL LOW (ref 3.87–5.11)
RDW: 13.9 % (ref 11.5–15.5)
WBC: 8.2 10*3/uL (ref 4.0–10.5)
nRBC: 0 % (ref 0.0–0.2)

## 2020-06-20 LAB — BASIC METABOLIC PANEL
Anion gap: 7 (ref 5–15)
BUN: 22 mg/dL (ref 8–23)
CO2: 22 mmol/L (ref 22–32)
Calcium: 7.8 mg/dL — ABNORMAL LOW (ref 8.9–10.3)
Chloride: 110 mmol/L (ref 98–111)
Creatinine, Ser: 0.68 mg/dL (ref 0.44–1.00)
GFR, Estimated: >60 mL/min (ref >60)
Glucose, Bld: 149 mg/dL — ABNORMAL HIGH (ref 70–99)
Potassium: 3.5 mmol/L (ref 3.5–5.1)
Sodium: 139 mmol/L (ref 135–145)

## 2020-06-20 MED ORDER — BISACODYL 10 MG RE SUPP
10.0000 mg | Freq: Every day | RECTAL | Status: DC | PRN
  Administered 2020-06-20: 22:00:00 10 mg via RECTAL
  Filled 2020-06-20: qty 1

## 2020-06-20 MED ORDER — SODIUM CHLORIDE 0.45 % IV SOLN: INTRAVENOUS | Status: DC

## 2020-06-20 MED ORDER — FLEET ENEMA 7-19 GM/118ML RE ENEM: 1.0000 | ENEMA | Freq: Once | RECTAL | Status: DC | PRN

## 2020-06-20 MED ORDER — ASPIRIN EC 81 MG PO TBEC
81.0000 mg | DELAYED_RELEASE_TABLET | Freq: Every day | ORAL | Status: DC
  Administered 2020-06-20 – 2020-06-22 (×3): 81 mg via ORAL
  Filled 2020-06-20 (×3): qty 1

## 2020-06-20 MED ORDER — CLINDAMYCIN PHOSPHATE 600 MG/50ML IV SOLN
600.0000 mg | Freq: Three times a day (TID) | INTRAVENOUS | Status: AC
  Administered 2020-06-20 (×3): 600 mg via INTRAVENOUS
  Filled 2020-06-20 (×4): qty 50

## 2020-06-20 MED ORDER — ALUM & MAG HYDROXIDE-SIMETH 200-200-20 MG/5ML PO SUSP: 30.0000 mL | ORAL | Status: DC | PRN

## 2020-06-20 MED ORDER — MORPHINE SULFATE (PF) 2 MG/ML IV SOLN: 0.5000 mg | INTRAVENOUS | Status: DC | PRN

## 2020-06-20 MED ORDER — ZOLPIDEM TARTRATE 5 MG PO TABS
5.0000 mg | ORAL_TABLET | Freq: Every evening | ORAL | Status: DC | PRN
  Administered 2020-06-22: 04:00:00 5 mg via ORAL
  Filled 2020-06-20: qty 1

## 2020-06-20 MED ORDER — ENOXAPARIN SODIUM 30 MG/0.3ML ~~LOC~~ SOLN
30.0000 mg | SUBCUTANEOUS | Status: DC
  Administered 2020-06-20 – 2020-06-22 (×3): 30 mg via SUBCUTANEOUS
  Filled 2020-06-20 (×3): qty 0.3

## 2020-06-20 MED ORDER — SENNA 8.6 MG PO TABS
1.0000 | ORAL_TABLET | Freq: Two times a day (BID) | ORAL | Status: DC
  Administered 2020-06-20 – 2020-06-22 (×5): 8.6 mg via ORAL
  Filled 2020-06-20 (×5): qty 1

## 2020-06-20 MED ORDER — AZELASTINE HCL 0.1 % NA SOLN
2.0000 | Freq: Two times a day (BID) | NASAL | Status: DC
  Administered 2020-06-20 – 2020-06-22 (×3): 2 via NASAL
  Filled 2020-06-20: qty 30

## 2020-06-20 MED ORDER — METOCLOPRAMIDE HCL 10 MG PO TABS
5.0000 mg | ORAL_TABLET | Freq: Three times a day (TID) | ORAL | Status: DC | PRN
Start: 2020-06-19 — End: 2020-06-22

## 2020-06-20 MED ORDER — PHENOL 1.4 % MT LIQD
1.0000 | OROMUCOSAL | Status: DC | PRN
  Filled 2020-06-20: qty 177

## 2020-06-20 MED ORDER — METOCLOPRAMIDE HCL 5 MG/ML IJ SOLN: 5.0000 mg | Freq: Three times a day (TID) | INTRAMUSCULAR | Status: DC | PRN

## 2020-06-20 MED ORDER — HYDROCODONE-ACETAMINOPHEN 5-325 MG PO TABS
1.0000 | ORAL_TABLET | ORAL | Status: DC | PRN
  Administered 2020-06-20 – 2020-06-21 (×4): 1 via ORAL
  Administered 2020-06-22 (×2): 2 via ORAL
  Filled 2020-06-20: qty 1
  Filled 2020-06-20: qty 2
  Filled 2020-06-20: qty 1
  Filled 2020-06-20: qty 2
  Filled 2020-06-20 (×4): qty 1

## 2020-06-20 MED ORDER — MUPIROCIN 2 % EX OINT
1.0000 "application " | TOPICAL_OINTMENT | Freq: Two times a day (BID) | CUTANEOUS | Status: DC
  Administered 2020-06-20 – 2020-06-22 (×4): 1 via NASAL
  Filled 2020-06-20: qty 22

## 2020-06-20 MED ORDER — ENSURE ENLIVE PO LIQD
237.0000 mL | Freq: Two times a day (BID) | ORAL | Status: DC
  Administered 2020-06-21 – 2020-06-22 (×3): 237 mL via ORAL

## 2020-06-20 MED ORDER — CEFAZOLIN SODIUM-DEXTROSE 1-4 GM/50ML-% IV SOLN
1.0000 g | Freq: Three times a day (TID) | INTRAVENOUS | Status: AC
  Administered 2020-06-20 (×3): 1 g via INTRAVENOUS
  Filled 2020-06-20 (×3): qty 50

## 2020-06-20 MED ORDER — CHLORHEXIDINE GLUCONATE CLOTH 2 % EX PADS
6.0000 | MEDICATED_PAD | Freq: Every day | CUTANEOUS | Status: DC
  Administered 2020-06-20: 13:00:00 6 via TOPICAL

## 2020-06-20 MED ORDER — MAGNESIUM HYDROXIDE 400 MG/5ML PO SUSP: 30.0000 mL | Freq: Every day | ORAL | Status: DC | PRN

## 2020-06-20 MED ORDER — FERROUS SULFATE 325 (65 FE) MG PO TABS
325.0000 mg | ORAL_TABLET | Freq: Every day | ORAL | Status: DC
  Administered 2020-06-20 – 2020-06-22 (×3): 325 mg via ORAL
  Filled 2020-06-20 (×3): qty 1

## 2020-06-20 MED ORDER — ACETAMINOPHEN 325 MG PO TABS: 325.0000 mg | ORAL_TABLET | Freq: Four times a day (QID) | ORAL | Status: DC | PRN

## 2020-06-20 MED ORDER — MENTHOL 3 MG MT LOZG
1.0000 | LOZENGE | OROMUCOSAL | Status: DC | PRN
  Filled 2020-06-20: qty 9

## 2020-06-20 NOTE — Evaluation (Signed)
Physical Therapy Evaluation Patient Details Name: [name] MRN: [id number] DOB: Feb 05, 1928 Today's Date: 06/20/2020   History of Present Illness  presented to ER after fall (syncopal event?) in home environment, onset of L LE pain/inability to WB; admitted for mangement of L communited, displaced L femoral fracture, s/p IM nailing (06/19/20), PWB.  Clinical Impression  Upon evaluation, patient alert and oriented; follows commands and demonstrates good effort with mobility tasks.  Rates pain as mild, 2-4/10 per FACES scale (excerbated with movement).  Demonstrates fair post-op strength (3-/5) and ROM to L hip; generally limited by pain.  Currently requiring mod assist for bed mobility; min/mod assist for sit/stand, basic transfers and short-distance gait (5').  Demonstrates 3-point, step to gait pattern; min/mod assist for walker management/positioning; step by step cuing for stepping pattern and PWB L LE.  Declines additional distance due to fatigue. Would benefit from skilled PT to address above deficits and promote optimal return to PLOF.; recommend transition to STR upon discharge from acute hospitalization.     Follow Up Recommendations SNF    Equipment Recommendations  Rolling walker with 5" wheels    Recommendations for Other Services       Precautions / Restrictions Precautions Precautions: Fall Restrictions Weight Bearing Restrictions: Yes LLE Weight Bearing: Partial weight bearing      Mobility  Bed Mobility Overal bed mobility: Needs Assistance Bed Mobility: Supine to Sit     Supine to sit: Mod assist     General bed mobility comments: assist for LE management, truncal elevation and overall task sequencing    Transfers Overall transfer level: Needs assistance Equipment used: Rolling walker (2 wheeled) Transfers: Sit to/from Stand Sit to Stand: Min assist;Mod assist         General transfer comment: assist for lift off; step by step cuing for  hand/foot placement, movement sequencing  Ambulation/Gait Ambulation/Gait assistance: Min assist;Mod assist Gait Distance (Feet): 5 Feet Assistive device: Rolling walker (2 wheeled)       General Gait Details: 3-point, step to gait pattern; min/mod assist for walker management/positioning; step by step cuing for stepping pattern and PWB L LE  Stairs            Wheelchair Mobility    Modified Rankin (Stroke Patients Only)       Balance Overall balance assessment: Needs assistance Sitting-balance support: No upper extremity supported;Feet supported Sitting balance-Leahy Scale: Good     Standing balance support: Bilateral upper extremity supported Standing balance-Leahy Scale: Poor                               Pertinent Vitals/Pain Pain Assessment: Faces Faces Pain Scale: Hurts a little bit Pain Location: L hip Pain Descriptors / Indicators: Aching;Grimacing;Guarding Pain Intervention(s): Limited activity within patient's tolerance;Monitored during session;Repositioned    Home Living Family/patient expects to be discharged to:: Skilled nursing facility Living Arrangements: Alone               Additional Comments: Lives alone in apartment, single-story with level entrance.  Has quad cane and rollator at home.    Prior Function Level of Independence: Independent with assistive device(s)         Comments: Ambulatory with quad cane within the apartment/home, rollator outside of apartment (to/from dining hall). Denies fall history outside of this episode.     Hand Dominance        Extremity/Trunk Assessment   Upper Extremity Assessment Upper Extremity  Assessment: Overall WFL for tasks assessed    Lower Extremity Assessment Lower Extremity Assessment:  (L hip grossly 3-/5, limited by pain; otherwise, LEs grossly wFL)       Communication   Communication: No difficulties  Cognition Arousal/Alertness: Awake/alert Behavior During  Therapy: WFL for tasks assessed/performed Overall Cognitive Status: Within Functional Limits for tasks assessed                                        General Comments      Exercises Other Exercises Other Exercises: Supine LE therex, 1x10, act assist ROM: ankle pumps, quad sets, SAQs, heel slides, hip abduct/adduct   Assessment/Plan    PT Assessment Patient needs continued PT services  PT Problem List Decreased strength;Decreased range of motion;Decreased activity tolerance;Decreased balance;Decreased mobility;Decreased coordination;Decreased knowledge of use of DME;Decreased safety awareness;Decreased knowledge of precautions;Cardiopulmonary status limiting activity;Pain       PT Treatment Interventions DME instruction;Gait training;Functional mobility training;Therapeutic activities;Therapeutic exercise;Balance training;Patient/family education    PT Goals (Current goals can be found in the Care Plan section)  Acute Rehab PT Goals Patient Stated Goal: to be able to walk again PT Goal Formulation: With patient Time For Goal Achievement: 07/04/20 Potential to Achieve Goals: Good    Frequency 7X/week   Barriers to discharge        Co-evaluation               AM-PAC PT "6 Clicks" Mobility  Outcome Measure Help needed turning from your back to your side while in a flat bed without using bedrails?: A Little Help needed moving from lying on your back to sitting on the side of a flat bed without using bedrails?: A Lot Help needed moving to and from a bed to a chair (including a wheelchair)?: A Lot Help needed standing up from a chair using your arms (e.g., wheelchair or bedside chair)?: A Lot Help needed to walk in hospital room?: A Lot Help needed climbing 3-5 steps with a railing? : A Lot 6 Click Score: 13    End of Session Equipment Utilized During Treatment: Gait belt Activity Tolerance: Patient tolerated treatment well Patient left: in chair;with  call bell/phone within reach;with chair alarm set Nurse Communication: Mobility status PT Visit Diagnosis: Muscle weakness (generalized) (M62.81);Other abnormalities of gait and mobility (R26.89);Pain Pain - Right/Left: Left Pain - part of body: Hip    Time: 1001-1031 PT Time Calculation (min) (ACUTE ONLY): 30 min   Charges:   PT Evaluation $PT Eval Moderate Complexity: 1 Mod PT Treatments $Therapeutic Exercise: 8-22 mins        Kaito Schulenburg H. Owens Shark, PT, DPT, NCS 06/20/20, 10:56 AM [phone]

## 2020-06-20 NOTE — Plan of Care (Signed)
  Problem: Education: Goal: Verbalization of understanding the information provided (i.e., activity precautions, restrictions, etc) will improve Outcome: Progressing   

## 2020-06-20 NOTE — Progress Notes (Signed)
Initial Nutrition Assessment  DOCUMENTATION CODES:   Severe malnutrition in context of social or environmental circumstances,Underweight  INTERVENTION:  Downgrade diet to dysphagia 3 (mechanical soft) per patient request as she does not have her upper dentures here. Patient also requests assistance with order meals.  Provide Ensure Enlive po BID, each supplement provides 350 kcal and 20 grams of protein. Patient prefers chocolate.  Provide Magic cup TID with meals, each supplement provides 290 kcal and 9 grams of protein. Patient prefers chocolate.  Provide MVI po daily.  Monitor magnesium, potassium, and phosphorus daily for at least 3 days, MD to replete as needed, as pt is at risk for refeeding syndrome given severe malnutrition.  NUTRITION DIAGNOSIS:   Severe Malnutrition related to social / environmental circumstances (advanced age, suspected inadequte oral intake) as evidenced by severe fat depletion,severe muscle depletion.  GOAL:   Patient will meet greater than or equal to 90% of their needs  MONITOR:   PO intake,Supplement acceptance,Labs,Weight trends,Skin,I & O's  REASON FOR ASSESSMENT:   Malnutrition Screening Tool,Consult Assessment of nutrition requirement/status  ASSESSMENT:   86 year old female with PMHx of GERD, HLD, arthritis, depression, hx partial thyroidectomy, CVA, HTN, breast cancer s/p bilateral mastectomies 1986 admitted after a fall with left intertrochanteric hip fracture s/p IM nail 12/12.   Met with patient at bedside. She reports her appetite is fairly good but she is unable to eat well because she is missing her dentures (patient with full upper dentures). She reports she needs softer foods. After discussions options patient amenable to trying mechanical soft (dysphagia 3) diet to see if she tolerates better. She reports she typically eats grits, eggs, and bacon for breakfast. For other meals she likes softer foods such as mashed potatoes, soup,  ice cream, or pudding. Patient requests assistance with ordering meals. She is amenable to trying chocolate Ensure and Magic Cup. She reports she drinks Boost at home.  Patient reports she is losing weight but is unsure of UBW or weight trend. Per weights in chart patient appears to be fairly weight-stable for the past 1-2 years.  Medications reviewed and include: ferrous suflate 325 mg daily, MVI daily, Protonix, Miralax, senna.  Labs reviewed.  NUTRITION - FOCUSED PHYSICAL EXAM:  Flowsheet Row Most Recent Value  Orbital Region Severe depletion  Upper Arm Region Severe depletion  Thoracic and Lumbar Region Severe depletion  Buccal Region Severe depletion  Temple Region Severe depletion  Clavicle Bone Region Severe depletion  Clavicle and Acromion Bone Region Severe depletion  Scapular Bone Region Severe depletion  Dorsal Hand Severe depletion  Patellar Region Severe depletion  Anterior Thigh Region Severe depletion  Posterior Calf Region Severe depletion  Edema (RD Assessment) None  Hair Reviewed  Eyes Reviewed  Mouth Reviewed  [missing upper dentures]  Skin Reviewed  Nails Reviewed     Diet Order:   Diet Order            Diet regular Room service appropriate? Yes; Fluid consistency: Thin  Diet effective now                EDUCATION NEEDS:   No education needs have been identified at this time  Skin:  Skin Assessment: Skin Integrity Issues: Skin Integrity Issues:: Other (Comment),Incisions Incisions: closed incision left hip Other: pressure injury right ankle (staging not documented)  Last BM:  Unknown  Height:   Ht Readings from Last 1 Encounters:  06/18/20 5' (1.524 m)   Weight:   Wt Readings from  Last 1 Encounters:  06/18/20 36.7 kg   Ideal Body Weight:  45.5 kg  BMI:  Body mass index is 15.82 kg/m.  Estimated Nutritional Needs:   Kcal:  1200-1400  Protein:  60-70 grams  Fluid:  1.2 L/day  Jacklynn Barnacle, MS, RD, LDN Pager number available  on Amion

## 2020-06-20 NOTE — Progress Notes (Signed)
Subjective: 1 Day Post-Op Procedure(s) (LRB): INTRAMEDULLARY (IM) NAIL INTERTROCHANTRIC (Left)   Patient is alert and sitting up in a chair eating lunch.  She has minimal pain.  Her son is present.  Hemoglobin remained stable.  Platelets are low at 66,000 however.  Patient reports pain as mild.  Objective:   VITALS:   Vitals:   06/20/20 1116 06/20/20 1544  BP: (!) 128/52 (!) 159/54  Pulse: 74 78  Resp: 18 17  Temp: 98.3 F (36.8 C) 98.4 F (36.9 C)  SpO2: 97% 96%    Neurologically intact Sensation intact distally Intact pulses distally Dorsiflexion/Plantar flexion intact Incision: scant drainage  LABS Recent Labs    06/18/20 1109 06/19/20 0505 06/20/20 0432  HGB 12.2 10.4* 8.1*  HCT 37.6 30.7* 25.1*  WBC 8.6 7.9 8.2  PLT 130* 87* 66*    Recent Labs    06/18/20 1109 06/19/20 0505 06/20/20 0432  NA 140 137 139  K 3.8 3.6 3.5  BUN 23 20 22   CREATININE 0.70 0.78 0.68  GLUCOSE 139* 130* 149*    Recent Labs    06/18/20 1109  INR 1.1     Assessment/Plan: 1 Day Post-Op Procedure(s) (LRB): INTRAMEDULLARY (IM) NAIL INTERTROCHANTRIC (Left)   Advance diet Up with therapy D/C IV fluids Discharge to SNF when stable  Enteric-coated aspirin twice daily on discharge for 6 weeks  Partial weightbearing left leg  Return to clinic in 2 weeks for exam and x-ray on discharge

## 2020-06-20 NOTE — Progress Notes (Addendum)
Progress Note    [name]  UDJ:[id number] DOB: [birth year]  DOA: 06/18/2020 PCP: Venia Carbon, MD      Brief Narrative:    Medical records reviewed and are as summarized below:  [name] is a 86 y.o. 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  She was brought to the hospital after a fall at home.  Reportedly, she blacked out and fell.  She uses a cane at home and apparently she had her cane with her when she fell.  She was found to have moderately displaced and comminuted intertrochanteric fracture of the proximal left femur.    Assessment/Plan:   Principal Problem:   Intertrochanteric fracture of left femur [facility]) Active Problems:   GERD   Hypertension   Depression   Fall at home, initial encounter    Body mass index is 15.82 kg/m.  (Underweight)   Left intertrochanteric fracture of left femur, s/p fall: Continue analgesics as needed for pain.  S/p intramedullary intertrochanteric left hip fracture on 06/19/2020.  Follow-up with orthopedic surgeon.  S/p ?syncope: 2D echo showed EF estimated at 60 to 65%, mild LVH and grade 1 diastolic dysfunction. Outpatient follow-up for Zio patch cardiac monitoring is recommended.  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 Yes; Fluid consistency: Thin  Diet effective now                    Consultants:  Orthopedic surgeon, Dr. Sabra Heck  Procedures: S/p intramedullary intertrochanteric left hip fracture on 06/19/2020    Medications:   . amLODipine  5 mg Oral Daily  . aspirin  81 mg Oral Daily  . azelastine  2 spray  Each Nare BID  . Chlorhexidine Gluconate Cloth  6 each Topical Daily  . cycloSPORINE  1 drop Both Eyes BID  . enoxaparin (LOVENOX) injection  30 mg Subcutaneous Q24H  . ferrous sulfate  325 mg Oral Q breakfast  . fluticasone  2 spray Each Nare Daily  . loratadine  10 mg Oral Daily  . LORazepam  0.5 mg Oral BID  . multivitamin with minerals  1 tablet Oral Daily  . mupirocin ointment  1 application Nasal BID  . pantoprazole  40 mg Oral BID  . polyethylene glycol  17 g Oral QHS  . senna  1 tablet Oral BID  . topiramate  100 mg Oral BID   Continuous Infusions: . sodium chloride 75 mL/hr at 06/20/20 0912  .  ceFAZolin (ANCEF) IV 1 g (06/20/20 0916)  . clindamycin (CLEOCIN) IV 600 mg (06/20/20 1009)  . methocarbamol (ROBAXIN) IV       Anti-infectives (From admission, onward)   Start     Dose/Rate Route Frequency Ordered Stop   06/20/20 0845  clindamycin (CLEOCIN) IVPB 600 mg        600 mg 100 mL/hr over 30 Minutes Intravenous Every 8 hours 06/20/20 0753 06/21/20 0559   06/20/20 0845  ceFAZolin (ANCEF) IVPB 1 g/50 mL premix        1 g 100 mL/hr over 30 Minutes Intravenous Every 8 hours 06/20/20 0753 06/21/20 0559  06/19/20 1015  ceFAZolin (ANCEF) IVPB 2g/100 mL premix        2 g 200 mL/hr over 30 Minutes Intravenous On call to O.R. 06/19/20 0917 06/19/20 1647   06/19/20 1015  clindamycin (CLEOCIN) IVPB 600 mg        600 mg 100 mL/hr over 30 Minutes Intravenous On call to O.R. 06/19/20 0917 06/19/20 1635             Family Communication/Anticipated D/C date and plan/Code Status   DVT prophylaxis: enoxaparin (LOVENOX) injection 30 mg Start: 06/20/20 0845 SCDs Start: 06/20/20 0753 SCDs Start: 06/18/20 1316     Code Status: Full Code  Family Communication: None Disposition Plan:    Status is: Inpatient  Remains inpatient appropriate because:Unsafe d/c plan and Inpatient level of care appropriate due to severity of illness   Dispo: The patient is from: Home               Anticipated d/c is to: SNF              Anticipated d/c date is: 3 days              Patient currently is not medically stable to d/c.           Subjective:   Interval events noted.  C/o left hip pain.  Objective:    Vitals:   06/19/20 2326 06/20/20 0418 06/20/20 0819 06/20/20 1116  BP: (!) 173/60 130/62 (!) 162/75 (!) 128/52  Pulse: 73 69 81 74  Resp: 14 14 15 18   Temp: 98.2 F (36.8 C) 97.6 F (36.4 C) 98.2 F (36.8 C) 98.3 F (36.8 C)  TempSrc:      SpO2: 98% 94% 97% 97%  Weight:      Height:       No data found.   Intake/Output Summary (Last 24 hours) at 06/20/2020 1236 Last data filed at 06/20/2020 0700 Gross per 24 hour  Intake 1459.38 ml  Output 615 ml  Net 844.38 ml   Filed Weights   06/18/20 1105  Weight: 36.7 kg    Exam:  GEN: NAD SKIN: Warm and dry EYES: Abnormal right eye with right eye blindness ENT: MMM CV: RRR PULM: CTA B ABD: soft, ND, NT, +BS CNS: AAO x 3, non focal EXT: Mild left hip tenderness and swelling.  Dressing on left hip surgical wound is clean, dry and intact.     Data Reviewed:   I have personally reviewed following labs and imaging studies:  Labs: Labs show the following:   Basic Metabolic Panel: Recent Labs  Lab 06/18/20 1109 06/19/20 0505 06/19/20 1255 06/20/20 0432  NA 140 137  --  139  K 3.8 3.6  --  3.5  CL 108 106  --  110  CO2 23 24  --  22  GLUCOSE 139* 130*  --  149*  BUN 23 20  --  22  CREATININE 0.70 0.78  --  0.68  CALCIUM 8.7* 8.3*  --  7.8*  MG  --   --  1.9  --   PHOS  --   --  3.7  --    GFR Estimated Creatinine Clearance: 27.1 mL/min (by C-G formula based on SCr of 0.68 mg/dL). Liver Function Tests: No results for input(s): AST, ALT, ALKPHOS, BILITOT, PROT, ALBUMIN in the last 168 hours. No results for input(s): LIPASE, AMYLASE in the last 168 hours. No results for input(s): AMMONIA in the last 168 hours. Coagulation profile  Recent Labs  Lab 06/18/20 1109  INR 1.1     CBC: Recent Labs  Lab 06/18/20 1109 06/19/20 0505 06/20/20 0432  WBC 8.6 7.9 8.2  NEUTROABS 6.2  --  6.5  HGB 12.2 10.4* 8.1*  HCT 37.6 30.7* 25.1*  MCV 98.7 95.6 98.0  PLT 130* 87* 66*   Cardiac Enzymes: No results for input(s): CKTOTAL, CKMB, CKMBINDEX, TROPONINI in the last 168 hours. BNP (last 3 results) No results for input(s): PROBNP in the last 8760 hours. CBG: No results for input(s): GLUCAP in the last 168 hours. D-Dimer: No results for input(s): DDIMER in the last 72 hours. Hgb A1c: No results for input(s): HGBA1C in the last 72 hours. Lipid Profile: No results for input(s): CHOL, HDL, LDLCALC, TRIG, CHOLHDL, LDLDIRECT in the last 72 hours. Thyroid function studies: No results for input(s): TSH, T4TOTAL, T3FREE, THYROIDAB in the last 72 hours.  Invalid input(s): FREET3 Anemia work up: No results for input(s): VITAMINB12, FOLATE, FERRITIN, TIBC, IRON, RETICCTPCT in the last 72 hours. Sepsis Labs: Recent Labs  Lab 06/18/20 1109 06/19/20 0505 06/20/20 0432  WBC 8.6 7.9 8.2    Microbiology Recent Results (from the past 240 hour(s))  Resp Panel by RT-PCR (Flu A&B, Covid) Nasopharyngeal Swab     Status: None   Collection Time: 06/18/20 11:09 AM   Specimen: Nasopharyngeal Swab; Nasopharyngeal(NP) swabs in vial transport medium  Result Value Ref Range Status   SARS Coronavirus 2 by RT PCR NEGATIVE NEGATIVE Final    Comment: (NOTE) SARS-CoV-2 target nucleic acids are NOT DETECTED.  The SARS-CoV-2 RNA is generally detectable in upper respiratory specimens during the acute phase of infection. The lowest concentration of SARS-CoV-2 viral copies this assay can detect is 138 copies/mL. A negative result does not preclude SARS-Cov-2 infection and should not be used as the sole basis for treatment or other patient management decisions. A negative result may occur with  improper specimen collection/handling, submission of specimen other than nasopharyngeal  swab, presence of viral mutation(s) within the areas targeted by this assay, and inadequate number of viral copies(<138 copies/mL). A negative result must be combined with clinical observations, patient history, and epidemiological information. The expected result is Negative.  Fact Sheet for Patients:  EntrepreneurPulse.com.au  Fact Sheet for Healthcare Providers:  IncredibleEmployment.be  This test is no t yet approved or cleared by the [place] FDA and  has been authorized for detection and/or diagnosis of SARS-CoV-2 by FDA under an Emergency Use Authorization (EUA). This EUA will remain  in effect (meaning this test can be used) for the duration of the COVID-19 declaration under Section 564(b)(1) of the Act, 21 U.S.C.section 360bbb-3(b)(1), unless the authorization is terminated  or revoked sooner.       Influenza A by PCR NEGATIVE NEGATIVE Final   Influenza B by PCR NEGATIVE NEGATIVE Final    Comment: (NOTE) The Xpert Xpress SARS-CoV-2/FLU/RSV plus assay is intended as an aid in the diagnosis of influenza from Nasopharyngeal swab specimens and should not be used as a sole basis for treatment. Nasal washings and aspirates are unacceptable for Xpert Xpress SARS-CoV-2/FLU/RSV testing.  Fact Sheet for Patients: EntrepreneurPulse.com.au  Fact Sheet for Healthcare Providers: IncredibleEmployment.be  This test is not yet approved or cleared by the [place] FDA and has been authorized for detection and/or diagnosis of SARS-CoV-2 by FDA under an Emergency Use Authorization (EUA). This EUA will remain in effect (meaning this test can be used) for the duration of the COVID-19 declaration under Section 564(b)(1)  of the Act, 21 U.S.C. section 360bbb-3(b)(1), unless the authorization is terminated or revoked.  Performed at [facility], [address] [place], [place] [address]   Surgical  pcr screen     Status: Abnormal   Collection Time: 06/19/20  5:53 AM   Specimen: Nasal Mucosa; Nasal Swab  Result Value Ref Range Status   MRSA, PCR NEGATIVE NEGATIVE Final   Staphylococcus aureus POSITIVE (A) NEGATIVE Final    Comment: (NOTE) The Xpert SA Assay (FDA approved for NASAL specimens in patients 38 years of age and older), is one component of a comprehensive surveillance program. It is not intended to diagnose infection nor to guide or monitor treatment. Performed at [facility], [address] [place], [place] [address]     Procedures and diagnostic studies:  ECHOCARDIOGRAM COMPLETE  Result Date: 06/19/2020    ECHOCARDIOGRAM REPORT   Patient Name:   MANETTE DOTO Date of Exam: 06/19/2020 Medical Rec #:  [id number]      Height:       60.0 in Accession #:    [id number]     Weight:       81.0 lb Date of Birth:  06/23/1928      BSA:          1.271 m Patient Age:    43 years       BP:           123/99 mmHg Patient Gender: F              HR:           79 bpm. Exam Location:  [facility] Procedure: 2D Echo Indications:     Syncope R55  History:         Patient has prior history of Echocardiogram examinations, most                  recent 12/27/2017.  Sonographer:     Arville Go RDCS Referring Phys:  [id number] YIRSWNIO AGBATA Diagnosing Phys: Ida Rogue MD  Sonographer Comments: Technically challenging study due to limited acoustic windows and Technically difficult study due to poor echo windows. Image acquisition challenging due to breast implants. IMPRESSIONS  1. Left ventricular ejection fraction, by estimation, is 60 to 65%. The left ventricle has normal function. The left ventricle has no regional wall motion abnormalities. There is mild left ventricular hypertrophy. Left ventricular diastolic parameters are consistent with Grade I diastolic dysfunction (impaired relaxation).  2. Right ventricular systolic function is normal. The right ventricular size is normal. Tricuspid  regurgitation signal is inadequate for assessing PA pressure. FINDINGS  Left Ventricle: Left ventricular ejection fraction, by estimation, is 60 to 65%. The left ventricle has normal function. The left ventricle has no regional wall motion abnormalities. The left ventricular internal cavity size was normal in size. There is  mild left ventricular hypertrophy. Left ventricular diastolic parameters are consistent with Grade I diastolic dysfunction (impaired relaxation). Right Ventricle: The right ventricular size is normal. No increase in right ventricular wall thickness. Right ventricular systolic function is normal. Tricuspid regurgitation signal is inadequate for assessing PA pressure. Left Atrium: Left atrial size was normal in size. Right Atrium: Right atrial size was normal in size. Pericardium: There is no evidence of pericardial effusion. Mitral Valve: The mitral valve is normal in structure. No evidence of mitral valve regurgitation. No evidence of mitral valve stenosis. Tricuspid Valve: The tricuspid valve is normal in structure. Tricuspid valve regurgitation is not demonstrated. No evidence  of tricuspid stenosis. Aortic Valve: The aortic valve was not well visualized. Aortic valve regurgitation is not visualized. No aortic stenosis is present. Aortic valve peak gradient measures 3.9 mmHg. Pulmonic Valve: The pulmonic valve was normal in structure. Pulmonic valve regurgitation is not visualized. No evidence of pulmonic stenosis. Aorta: The aortic root is normal in size and structure. Venous: The inferior vena cava is normal in size with greater than 50% respiratory variability, suggesting right atrial pressure of 3 mmHg. IAS/Shunts: No atrial level shunt detected by color flow Doppler.  LEFT VENTRICLE PLAX 2D LVIDd:         3.62 cm  Diastology LVIDs:         2.43 cm  LV e' medial:    2.72 cm/s LV PW:         1.11 cm  LV E/e' medial:  21.0 LV IVS:        0.86 cm  LV e' lateral:   8.05 cm/s LVOT diam:     1.70  cm  LV E/e' lateral: 7.1 LVOT Area:     2.27 cm  RIGHT VENTRICLE RV Basal diam:  2.92 cm LEFT ATRIUM           Index LA diam:      3.20 cm 2.52 cm/m LA Vol (A4C): 9.4 ml  7.40 ml/m  AORTIC VALVE AV Vmax:      99.10 cm/s AV Peak Grad: 3.9 mmHg  AORTA Ao Root diam: 2.50 cm MITRAL VALVE               TRICUSPID VALVE MV Area (PHT): 4.17 cm    TV Peak grad:   19.4 mmHg MV Decel Time: 182 msec    TV Vmax:        2.20 m/s MV E velocity: 57.00 cm/s MV A velocity: 84.00 cm/s  SHUNTS MV E/A ratio:  0.68        Systemic Diam: 1.70 cm Ida Rogue MD Electronically signed by Ida Rogue MD Signature Date/Time: 06/19/2020/2:17:08 PM    Final    DG HIP OPERATIVE UNILAT W OR W/O PELVIS LEFT  Result Date: 06/19/2020 CLINICAL DATA:  Internal fixation EXAM: OPERATIVE LEFT HIP (WITH PELVIS IF PERFORMED) 5 VIEWS TECHNIQUE: Fluoroscopic spot image(s) were submitted for interpretation post-operatively. COMPARISON:  06/18/2020 FINDINGS: Changes of internal fixation across the left femoral intertrochanteric fracture. Anatomic alignment. No hardware complicating feature. IMPRESSION: Internal fixation.  No complicating feature. Electronically Signed   By: Rolm Baptise M.D.   On: 06/19/2020 19:26               LOS: 2 days   Libbi Towner  [facility]   Pager on [email or web address]. If 7PM-7AM, please contact night-coverage at [email or web address]     06/20/2020, 12:36 PM

## 2020-06-20 NOTE — NC FL2 (Signed)
[place] LEVEL OF CARE SCREENING TOOL     IDENTIFICATION  Patient Name: [name] Birthdate: [birth year] Sex: female Admission Date (Current Location): 06/18/2020  [place] and [place] Number:  Engineering geologist and Address:  [facility], [address], [place], [place] [address]      Provider Number: [id number]  Attending Physician Name and Address:  Jennye Boroughs, MD  Relative Name and Phone Number:  Sarayu Prevost [phone]    Current Level of Care: Hospital Recommended Level of Care: [place] Prior Approval Number:    Date Approved/Denied:   PASRR Number: [id number]  Discharge Plan: SNF    Current Diagnoses: Patient Active Problem List   Diagnosis Date Noted  . Intertrochanteric fracture of left femur ([place]) 06/18/2020  . Depression   . Fall at home, initial encounter   . Benzodiazepine dependence ([place]) 04/17/2018  . PAD (peripheral artery disease) ([place]) 07/22/2017  . Narcotic dependence ([place]) 01/03/2017  . Thrombocytopenia ([place]) 09/20/2016  . 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 Postherpetic neuralgia 12/15/2015  . Advance directive discussed with patient 10/27/2015  . Allergic rhinitis due to pollen 05/20/2015  . Routine general medical examination at a health care facility 02/22/2014  . Protein-calorie malnutrition ([place]) 03/24/2012  . Hypertension   . B12 deficiency 05/03/2009  . Neuropathy ([place]) 04/04/2009  . Episodic mood disorder ([place]) 06/16/2008  . Varicella-zoster infection 11/25/2007  . GLAUCOMA NOS 11/14/2006  . SLEEP DISORDER 11/14/2006  . HYPERLIPIDEMIA 08/28/2006  . GERD 08/28/2006  . OSTEOARTHRITIS 08/28/2006  . BREAST CANCER, HX OF 08/28/2006    Orientation RESPIRATION BLADDER Height & Weight     Self,Time,Situation,Place  Normal External catheter Weight: 36.7 kg Height:  5' (152.4 cm)  BEHAVIORAL SYMPTOMS/MOOD NEUROLOGICAL BOWEL NUTRITION STATUS       Continent Diet (Regular)  AMBULATORY STATUS COMMUNICATION OF NEEDS Skin   Extensive Assist Verbally Surgical wounds                       Personal Care Assistance Level of Assistance  Bathing,Feeding,Dressing Bathing Assistance: Limited assistance Feeding assistance: Independent Dressing Assistance: Limited assistance     Functional Limitations Info             SPECIAL CARE FACTORS FREQUENCY  PT (By licensed PT),OT (By licensed OT)                    Contractures Contractures Info: Not present    Additional Factors Info  Code Status,Allergies Code Status Info: Full Allergies Info: Duloxetine, Cymbalta, Maxitrol, Pregabalin, Tobradex           Current Medications (06/20/2020):  This is the current hospital active medication list Current Facility-Administered Medications  Medication Dose Route Frequency Provider Last Rate Last Admin  . 0.45 % sodium chloride infusion   Intravenous Continuous Earnestine Leys, MD 75 mL/hr at 06/20/20 0912 New Bag at 06/20/20 0912  . 0.9 %  sodium chloride infusion   Intravenous Continuous Earnestine Leys, MD 75 mL/hr at 06/19/20 1604 Restarted at 06/19/20 1645  . acetaminophen (TYLENOL) tablet 325-650 mg  325-650 mg Oral Q6H PRN Earnestine Leys, MD      . alum & mag hydroxide-simeth (MAALOX/MYLANTA) 200-200-20 MG/5ML suspension 30 mL  30 mL Oral Q4H PRN Earnestine Leys, MD      . amLODipine (NORVASC) tablet 5 mg  5 mg Oral Daily Earnestine Leys, MD   5 mg at 06/20/20 0958  . azelastine (  ASTELIN) 0.1 % nasal spray 2 spray  2 spray Each Nare BID Earnestine Leys, MD   2 spray at 06/20/20 1000  . bisacodyl (DULCOLAX) suppository 10 mg  10 mg Rectal Daily PRN Earnestine Leys, MD      . ceFAZolin (ANCEF) IVPB 1 g/50 mL premix  1 g Intravenous Lajuan Lines, MD 100 mL/hr at 06/20/20 0916 1 g at 06/20/20 0916  . Chlorhexidine Gluconate Cloth 2 % PADS 6 each  6 each Topical Daily Jennye Boroughs, MD      . clindamycin (CLEOCIN) IVPB 600 mg   600 mg Intravenous Lajuan Lines, MD 100 mL/hr at 06/20/20 1009 600 mg at 06/20/20 1009  . cycloSPORINE (RESTASIS) 0.05 % ophthalmic emulsion 1 drop  1 drop Both Eyes BID Earnestine Leys, MD   1 drop at 06/20/20 1001  . enoxaparin (LOVENOX) injection 30 mg  30 mg Subcutaneous Q24H Earnestine Leys, MD   30 mg at 06/20/20 0959  . fentaNYL (SUBLIMAZE) injection 50 mcg  50 mcg Intravenous Q30 min PRN Earnestine Leys, MD      . ferrous sulfate tablet 325 mg  325 mg Oral Q breakfast Earnestine Leys, MD   325 mg at 06/20/20 0957  . fluticasone (FLONASE) 50 MCG/ACT nasal spray 2 spray  2 spray Each Nare Daily Earnestine Leys, MD   2 spray at 06/20/20 1000  . HYDROcodone-acetaminophen (NORCO/VICODIN) 5-325 MG per tablet 1-2 tablet  1-2 tablet Oral Q4H PRN Earnestine Leys, MD   1 tablet at 06/20/20 [phone]  . loratadine (CLARITIN) tablet 10 mg  10 mg Oral Daily Earnestine Leys, MD   10 mg at 06/20/20 0958  . LORazepam (ATIVAN) tablet 0.5 mg  0.5 mg Oral BID Earnestine Leys, MD   0.5 mg at 06/19/20 2110  . magnesium hydroxide (MILK OF MAGNESIA) suspension 30 mL  30 mL Oral Daily PRN Earnestine Leys, MD      . menthol-cetylpyridinium (CEPACOL) lozenge 3 mg  1 lozenge Oral PRN Earnestine Leys, MD       Or  . phenol (CHLORASEPTIC) mouth spray 1 spray  1 spray Mouth/Throat PRN Earnestine Leys, MD      . methocarbamol (ROBAXIN) tablet 500 mg  500 mg Oral Q6H PRN Earnestine Leys, MD   500 mg at 06/19/20 1901   Or  . methocarbamol (ROBAXIN) 500 mg in dextrose 5 % 50 mL IVPB  500 mg Intravenous Q6H PRN Earnestine Leys, MD      . metoCLOPramide (REGLAN) tablet 5-10 mg  5-10 mg Oral Q8H PRN Earnestine Leys, MD       Or  . metoCLOPramide (REGLAN) injection 5-10 mg  5-10 mg Intravenous Q8H PRN Earnestine Leys, MD      . morphine 2 MG/ML injection 0.5-1 mg  0.5-1 mg Intravenous Q2H PRN Earnestine Leys, MD      . morphine 2 MG/ML injection 1 mg  1 mg Intravenous Q2H PRN Earnestine Leys, MD      . multivitamin with minerals tablet 1  tablet  1 tablet Oral Daily Earnestine Leys, MD   1 tablet at 06/20/20 [phone]  . mupirocin ointment (BACTROBAN) 2 % 1 application  1 application Nasal BID Jennye Boroughs, MD      . ondansetron [facility]) injection 4 mg  4 mg Intravenous Q6H PRN Earnestine Leys, MD   4 mg at 06/18/20 1622  . pantoprazole (PROTONIX) EC tablet 40 mg  40 mg Oral BID Earnestine Leys, MD   40 mg at 06/20/20 0957  .  polyethylene glycol (MIRALAX / GLYCOLAX) packet 17 g  17 g Oral QHS Earnestine Leys, MD   17 g at 06/19/20 2110  . senna (SENOKOT) tablet 8.6 mg  1 tablet Oral BID Earnestine Leys, MD   8.6 mg at 06/20/20 0957  . sodium phosphate (FLEET) 7-19 GM/118ML enema 1 enema  1 enema Rectal Once PRN Earnestine Leys, MD      . topiramate (TOPAMAX) tablet 100 mg  100 mg Oral BID Earnestine Leys, MD   100 mg at 06/20/20 0959  . zolpidem (AMBIEN) tablet 5 mg  5 mg Oral QHS PRN Earnestine Leys, MD         Discharge Medications: Please see discharge summary for a list of discharge medications.  Relevant Imaging Results:  Relevant Lab Results:   Additional Information SS# [id number]  Shelbie Ammons, RN

## 2020-06-20 NOTE — TOC Progression Note (Signed)
Transition of Care [facility]) - Progression Note    Patient Details  Name: [name] MRN: [id number] Date of Birth: 1928-03-17  Transition of Care [facility]) CM/SW [place], RN Phone Number: 06/20/2020, 2:35 PM  Clinical Narrative:   RNCM reached out to patient's DIL [name] regarding bed offers after reviewing all bed offers patient and DIL have decided on [facility]. RNCM accepted bed in hub and notified Seth Bake of same.     Expected Discharge Plan: [place] Barriers to Discharge: No Barriers Identified  Expected Discharge Plan and Services Expected Discharge Plan: [place] arrangements for the past 2 months: Apartment                                       Social Determinants of Health (SDOH) Interventions    Readmission Risk Interventions No flowsheet data found.

## 2020-06-20 NOTE — TOC Initial Note (Signed)
Transition of Care [facility]) - Initial/Assessment Note    Patient Details  Name: [name] MRN: [id number] Date of Birth: [birth year]  Transition of Care [facility]) CM/SW Contact:    Shelbie Ammons, RN Phone Number: 06/20/2020, 11:19 AM  Clinical Narrative:   RNCM received phone call from patient's DIL and caregiver [name] to discuss patient's discharge planning and next steps. Discussed that ultimately the recommendation made by PT will determine patient's options but that a SNF work up can be initiated. She would prefer [facility] but is agreeable to a search of all facilities.  RNCM met with patient at bedside after SNF recommendation and she was agreeable as well RNCM verified PASSR, completed FL2 and started bed search.           Expected Discharge Plan: Skilled Nursing Facility Barriers to Discharge: No Barriers Identified   Patient Goals and CMS Choice        Expected Discharge Plan and Services Expected Discharge Plan: [place]       Living arrangements for the past 2 months: Apartment                                      Prior Living Arrangements/Services Living arrangements for the past 2 months: Apartment Lives with:: Self Patient language and need for interpreter reviewed:: Yes Do you feel safe going back to the place where you live?: Yes      Need for Family Participation in Patient Care: Yes (Comment) Care giver support system in place?: Yes (comment)   Criminal Activity/Legal Involvement Pertinent to Current Situation/Hospitalization: No - Comment as needed  Activities of Daily Living Home Assistive Devices/Equipment: Walker (specify type),Cane (specify quad or straight) ADL Screening (condition at time of admission) Patient's cognitive ability adequate to safely complete daily activities?: Yes Is the patient deaf or have difficulty hearing?: No Does the patient have difficulty seeing, even when wearing glasses/contacts?: Yes Does  the patient have difficulty concentrating, remembering, or making decisions?: No Patient able to express need for assistance with ADLs?: No Does the patient have difficulty dressing or bathing?: No Independently performs ADLs?: Yes (appropriate for developmental age) Does the patient have difficulty walking or climbing stairs?: No Weakness of Legs: Both Weakness of Arms/Hands: Both  Permission Sought/Granted                  Emotional Assessment Appearance:: Appears stated age Attitude/Demeanor/Rapport: Engaged Affect (typically observed): Appropriate,Calm Orientation: : Oriented to Situation,Oriented to  Time,Oriented to Place,Oriented to Self Alcohol / Substance Use: Not Applicable Psych Involvement: No (comment)  Admission diagnosis:  Intertrochanteric fracture of left femur ([place]) [S72.142A] Closed fracture of left hip, initial encounter ([place]) [S72.002A] Patient Active Problem List   Diagnosis Date Noted  . Intertrochanteric fracture of left femur ([place]) 06/18/2020  . Depression   . Fall at home, initial encounter   . Benzodiazepine dependence ([place]) 04/17/2018  . PAD (peripheral artery disease) ([place]) 07/22/2017  . Narcotic dependence ([place]) 01/03/2017  . Thrombocytopenia ([place]) 09/20/2016  . 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 Postherpetic neuralgia 12/15/2015  . Advance directive discussed with patient 10/27/2015  . Allergic rhinitis due to pollen 05/20/2015  . Routine general medical examination at a health care facility 02/22/2014  . Protein-calorie malnutrition ([place]) 03/24/2012  . Hypertension   . B12 deficiency 05/03/2009  . Neuropathy ([place]) 04/04/2009  . Episodic mood disorder ([place]) 06/16/2008  .  Varicella-zoster infection 11/25/2007  . GLAUCOMA NOS 11/14/2006  . SLEEP DISORDER 11/14/2006  . HYPERLIPIDEMIA 08/28/2006  . GERD 08/28/2006  . OSTEOARTHRITIS 08/28/2006  . BREAST CANCER, HX OF 08/28/2006   PCP:  Venia Carbon, MD Pharmacy:    [facility], [address] [place] [address] Phone: [phone] Fax: [phone]     Social Determinants of Health (SDOH) Interventions    Readmission Risk Interventions No flowsheet data found.

## 2020-06-21 LAB — BASIC METABOLIC PANEL
Anion gap: 6 (ref 5–15)
BUN: 18 mg/dL (ref 8–23)
CO2: 23 mmol/L (ref 22–32)
Calcium: 7.8 mg/dL — ABNORMAL LOW (ref 8.9–10.3)
Chloride: 109 mmol/L (ref 98–111)
Creatinine, Ser: 0.66 mg/dL (ref 0.44–1.00)
GFR, Estimated: >60 mL/min (ref >60)
Glucose, Bld: 140 mg/dL — ABNORMAL HIGH (ref 70–99)
Potassium: 3.5 mmol/L (ref 3.5–5.1)
Sodium: 138 mmol/L (ref 135–145)

## 2020-06-21 LAB — CBC WITH DIFFERENTIAL/PLATELET
Abs Immature Granulocytes: 0.04 10*3/uL (ref 0.00–0.07)
Basophils Absolute: 0 10*3/uL (ref 0.0–0.1)
Basophils Relative: 0 %
Eosinophils Absolute: 0 10*3/uL (ref 0.0–0.5)
Eosinophils Relative: 0 %
HCT: 24 % — ABNORMAL LOW (ref 36.0–46.0)
Hemoglobin: 8.1 g/dL — ABNORMAL LOW (ref 12.0–15.0)
Immature Granulocytes: 1 %
Lymphocytes Relative: 8 %
Lymphs Abs: 0.7 10*3/uL (ref 0.7–4.0)
MCH: 32.5 pg (ref 26.0–34.0)
MCHC: 33.8 g/dL (ref 30.0–36.0)
MCV: 96.4 fL (ref 80.0–100.0)
Monocytes Absolute: 0.6 10*3/uL (ref 0.1–1.0)
Monocytes Relative: 8 %
Neutro Abs: 7.1 10*3/uL (ref 1.7–7.7)
Neutrophils Relative %: 83 %
Platelets: 79 10*3/uL — ABNORMAL LOW (ref 150–400)
RBC: 2.49 MIL/uL — ABNORMAL LOW (ref 3.87–5.11)
RDW: 13.9 % (ref 11.5–15.5)
WBC: 8.5 10*3/uL (ref 4.0–10.5)
nRBC: 0 % (ref 0.0–0.2)

## 2020-06-21 LAB — MAGNESIUM: Magnesium: 1.8 mg/dL (ref 1.7–2.4)

## 2020-06-21 LAB — PHOSPHORUS: Phosphorus: 2.1 mg/dL — ABNORMAL LOW (ref 2.5–4.6)

## 2020-06-21 MED ORDER — K PHOS MONO-SOD PHOS DI & MONO 155-852-130 MG PO TABS
250.0000 mg | ORAL_TABLET | Freq: Three times a day (TID) | ORAL | Status: DC
  Administered 2020-06-21 – 2020-06-22 (×4): 250 mg via ORAL
  Filled 2020-06-21 (×6): qty 1

## 2020-06-21 MED ORDER — LORAZEPAM 0.5 MG PO TABS
0.5000 mg | ORAL_TABLET | Freq: Every day | ORAL | Status: DC
Start: 2020-06-22 — End: 2020-06-22

## 2020-06-21 NOTE — Progress Notes (Signed)
Progress Note    [name]  KVQ:[id number] DOB: 1928/05/07  DOA: 06/18/2020 PCP: Venia Carbon, MD      Brief Narrative:    Medical records reviewed and are as summarized below:  [name] is a 86 y.o. 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  She was brought to the hospital after a fall at home.  Reportedly, she blacked out and fell.  She uses a cane at home and apparently she had her cane with her when she fell.  She was found to have moderately displaced and comminuted intertrochanteric fracture of the proximal left femur.  She was treated with analgesics.  Orthopedic surgeon was consulted and she underwent intramedullary nail intertrochanteric left hip fracture on 06/19/2020.  She was evaluated by PT and OT recommended further rehabilitation at the skilled nursing facility.    Assessment/Plan:   Principal Problem:   Intertrochanteric fracture of left femur (HCC) Active Problems:   GERD   Hypertension   Depression   Fall at home, initial encounter   Protein-calorie malnutrition, severe    Body mass index is 15.82 kg/m.  (Underweight)   Left intertrochanteric fracture of left femur, s/p fall: Continue analgesics as needed for pain.  S/p intramedullary nail intertrochanteric left hip fracture on 06/19/2020.  Orthopedic surgeon recommends enteric-coated aspirin twice a day for 6 weeks for DVT prophylaxis on discharge.  Follow-up with orthopedic surgeon.  S/p ?syncope: 2D echo showed EF estimated at 60 to 65%, mild LVH and grade 1 diastolic dysfunction. Outpatient follow-up for Zio patch cardiac monitoring is recommended.  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    Diet Order            DIET DYS 3 Room service appropriate? Yes with Assist; Fluid consistency: Thin  Diet effective now                    Consultants:  Orthopedic surgeon, Dr. Sabra Heck  Procedures:  S/p intramedullary nail intertrochanteric left hip fracture on 06/19/2020     Medications:   . amLODipine  5 mg Oral Daily  . aspirin EC  81 mg Oral Daily  . azelastine  2 spray Each Nare BID  . cycloSPORINE  1 drop Both Eyes BID  . enoxaparin (LOVENOX) injection  30 mg Subcutaneous Q24H  . feeding supplement  237 mL Oral BID BM  . ferrous sulfate  325 mg Oral Q breakfast  . fluticasone  2 spray Each Nare Daily  . loratadine  10 mg Oral Daily  . [START ON 06/22/2020] LORazepam  0.5 mg Oral QHS  . multivitamin with minerals  1 tablet Oral Daily  . mupirocin ointment  1 application Nasal BID  . pantoprazole  40 mg Oral BID  . phosphorus  250 mg Oral TID  . polyethylene glycol  17 g Oral QHS  . senna  1 tablet Oral BID  . topiramate  100 mg Oral BID   Continuous Infusions: . methocarbamol (ROBAXIN) IV       Anti-infectives (From admission, onward)   Start     Dose/Rate Route Frequency Ordered Stop   06/20/20 0845  clindamycin (  CLEOCIN) IVPB 600 mg        600 mg 100 mL/hr over 30 Minutes Intravenous Every 8 hours 06/20/20 0753 06/21/20 1023   06/20/20 0845  ceFAZolin (ANCEF) IVPB 1 g/50 mL premix        1 g 100 mL/hr over 30 Minutes Intravenous Every 8 hours 06/20/20 0753 06/21/20 1024   06/19/20 1015  ceFAZolin (ANCEF) IVPB 2g/100 mL premix        2 g 200 mL/hr over 30 Minutes Intravenous On call to O.R. 06/19/20 0917 06/19/20 1647   06/19/20 1015  clindamycin (CLEOCIN) IVPB 600 mg        600 mg 100 mL/hr over 30 Minutes Intravenous On call to O.R. 06/19/20 0917 06/19/20 1635             Family Communication/Anticipated D/C date and plan/Code  Status   DVT prophylaxis: enoxaparin (LOVENOX) injection 30 mg Start: 06/20/20 0845 SCDs Start: 06/20/20 0753 SCDs Start: 06/18/20 1316     Code Status: Full Code  Family Communication: Plan of care was discussed with her daughter-in-law, [name], at the bedside. Disposition Plan:    Status is: Inpatient  Remains inpatient appropriate because:Unsafe d/c plan and Inpatient level of care appropriate due to severity of illness   Dispo: The patient is from: Home              Anticipated d/c is to: SNF              Anticipated d/c date is: 2 days              Patient currently is medically stable to d/c.           Subjective:   [place]anes of pain in the left hip.  She also reports dizziness but this is nothing new since she has been having chronic intermittent dizziness for a long time.  Objective:    Vitals:   06/20/20 2330 06/21/20 0255 06/21/20 0816 06/21/20 1140  BP: (!) 133/50 140/62 (!) 152/64 (!) 148/47  Pulse: 81 77 80 83  Resp: 16 16 18 18   Temp: 98.9 F (37.2 C) 100.1 F (37.8 C) 99.4 F (37.4 C) 98.2 F (36.8 C)  TempSrc:   Oral Oral  SpO2: 96% 96% 97% 99%  Weight:      Height:       No data found.   Intake/Output Summary (Last 24 hours) at 06/21/2020 1231 Last data filed at 06/21/2020 1145 Gross per 24 hour  Intake 1540.68 ml  Output 2200 ml  Net -659.32 ml   Filed Weights   06/18/20 1105  Weight: 36.7 kg    Exam:  GEN: NAD SKIN: Warm and dry EYES: No pallor or icterus ENT: MMM CV: RRR PULM: CTA B ABD: soft, ND, NT, +BS CNS: AAO x 3, non focal EXT: Mild left hip tenderness.  Dressing on left hip surgical wound is clean, dry and intact.      Data Reviewed:   I have personally reviewed following labs and imaging studies:  Labs: Labs show the following:   Basic Metabolic Panel: Recent Labs  Lab 06/18/20 1109 06/19/20 0505 06/19/20 1255 06/20/20 0432 06/21/20 0402  NA 140 137  --  139 138  K 3.8 3.6  --  3.5 3.5  CL  108 106  --  110 109  CO2 23 24  --  22 23  GLUCOSE 139* 130*  --  149* 140*  BUN 23 20  --  22 18  CREATININE 0.70 0.78  --  0.68 0.66  CALCIUM 8.7* 8.3*  --  7.8* 7.8*  MG  --   --  1.9  --  1.8  PHOS  --   --  3.7  --  2.1*   GFR Estimated Creatinine Clearance: 27.1 mL/min (by C-G formula based on SCr of 0.66 mg/dL). Liver Function Tests: No results for input(s): AST, ALT, ALKPHOS, BILITOT, PROT, ALBUMIN in the last 168 hours. No results for input(s): LIPASE, AMYLASE in the last 168 hours. No results for input(s): AMMONIA in the last 168 hours. Coagulation profile Recent Labs  Lab 06/18/20 1109  INR 1.1    CBC: Recent Labs  Lab 06/18/20 1109 06/19/20 0505 06/20/20 0432 06/21/20 0402  WBC 8.6 7.9 8.2 8.5  NEUTROABS 6.2  --  6.5 7.1  HGB 12.2 10.4* 8.1* 8.1*  HCT 37.6 30.7* 25.1* 24.0*  MCV 98.7 95.6 98.0 96.4  PLT 130* 87* 66* 79*   Cardiac Enzymes: No results for input(s): CKTOTAL, CKMB, CKMBINDEX, TROPONINI in the last 168 hours. BNP (last 3 results) No results for input(s): PROBNP in the last 8760 hours. CBG: No results for input(s): GLUCAP in the last 168 hours. D-Dimer: No results for input(s): DDIMER in the last 72 hours. Hgb A1c: No results for input(s): HGBA1C in the last 72 hours. Lipid Profile: No results for input(s): CHOL, HDL, LDLCALC, TRIG, CHOLHDL, LDLDIRECT in the last 72 hours. Thyroid function studies: No results for input(s): TSH, T4TOTAL, T3FREE, THYROIDAB in the last 72 hours.  Invalid input(s): FREET3 Anemia work up: No results for input(s): VITAMINB12, FOLATE, FERRITIN, TIBC, IRON, RETICCTPCT in the last 72 hours. Sepsis Labs: Recent Labs  Lab 06/18/20 1109 06/19/20 0505 06/20/20 0432 06/21/20 0402  WBC 8.6 7.9 8.2 8.5    Microbiology Recent Results (from the past 240 hour(s))  Resp Panel by RT-PCR (Flu A&B, Covid) Nasopharyngeal Swab     Status: None   Collection Time: 06/18/20 11:09 AM   Specimen: Nasopharyngeal Swab;  Nasopharyngeal(NP) swabs in vial transport medium  Result Value Ref Range Status   SARS Coronavirus 2 by RT PCR NEGATIVE NEGATIVE Final    Comment: (NOTE) SARS-CoV-2 target nucleic acids are NOT DETECTED.  The SARS-CoV-2 RNA is generally detectable in upper respiratory specimens during the acute phase of infection. The lowest concentration of SARS-CoV-2 viral copies this assay can detect is 138 copies/mL. A negative result does not preclude SARS-Cov-2 infection and should not be used as the sole basis for treatment or other patient management decisions. A negative result may occur with  improper specimen collection/handling, submission of specimen other than nasopharyngeal swab, presence of viral mutation(s) within the areas targeted by this assay, and inadequate number of viral copies(<138 copies/mL). A negative result must be combined with clinical observations, patient history, and epidemiological information. The expected result is Negative.  Fact Sheet for Patients:  EntrepreneurPulse.com.au  Fact Sheet for Healthcare Providers:  IncredibleEmployment.be  This test is no t yet approved or cleared by the [place] FDA and  has been authorized for detection and/or diagnosis of SARS-CoV-2 by FDA under an Emergency Use Authorization (EUA). This EUA will remain  in effect (meaning this test can be used) for the duration of the COVID-19 declaration under Section 564(b)(1) of the Act, 21 U.S.C.section 360bbb-3(b)(1), unless the authorization is terminated  or revoked sooner.       Influenza A by PCR NEGATIVE NEGATIVE Final   Influenza B by PCR NEGATIVE NEGATIVE Final  Comment: (NOTE) The Xpert Xpress SARS-CoV-2/FLU/RSV plus assay is intended as an aid in the diagnosis of influenza from Nasopharyngeal swab specimens and should not be used as a sole basis for treatment. Nasal washings and aspirates are unacceptable for Xpert Xpress  SARS-CoV-2/FLU/RSV testing.  Fact Sheet for Patients: EntrepreneurPulse.com.au  Fact Sheet for Healthcare Providers: IncredibleEmployment.be  This test is not yet approved or cleared by the [place] FDA and has been authorized for detection and/or diagnosis of SARS-CoV-2 by FDA under an Emergency Use Authorization (EUA). This EUA will remain in effect (meaning this test can be used) for the duration of the COVID-19 declaration under Section 564(b)(1) of the Act, 21 U.S.C. section 360bbb-3(b)(1), unless the authorization is terminated or revoked.  Performed at [facility], [address], [place], [place] [address]   Surgical pcr screen     Status: Abnormal   Collection Time: 06/19/20  5:53 AM   Specimen: Nasal Mucosa; Nasal Swab  Result Value Ref Range Status   MRSA, PCR NEGATIVE NEGATIVE Final   Staphylococcus aureus POSITIVE (A) NEGATIVE Final    Comment: (NOTE) The Xpert SA Assay (FDA approved for NASAL specimens in patients 29 years of age and older), is one component of a comprehensive surveillance program. It is not intended to diagnose infection nor to guide or monitor treatment. Performed at [facility], [address] [place],  [address]     Procedures and diagnostic studies:  DG HIP OPERATIVE UNILAT W OR W/O PELVIS LEFT  Result Date: 06/19/2020 CLINICAL DATA:  Internal fixation EXAM: OPERATIVE LEFT HIP (WITH PELVIS IF PERFORMED) 5 VIEWS TECHNIQUE: Fluoroscopic spot image(s) were submitted for interpretation post-operatively. COMPARISON:  06/18/2020 FINDINGS: Changes of internal fixation across the left femoral intertrochanteric fracture. Anatomic alignment. No hardware complicating feature. IMPRESSION: Internal fixation.  No complicating feature. Electronically Signed   By: Rolm Baptise M.D.   On: 06/19/2020 19:26               LOS: 3 days   Lyndee Herbst  [facility]    Pager on [email or web address]. If 7PM-7AM, please contact night-coverage at [email or web address]     06/21/2020, 12:31 PM

## 2020-06-21 NOTE — Progress Notes (Addendum)
Physical Therapy Treatment Patient Details Name: [name] MRN: [id number] DOB: [birth year] Today's Date: 06/21/2020    History of Present Illness presented to ER after fall (syncopal event?) in home environment, onset of L LE pain/inability to WB; admitted for mangement of L communited, displaced L femoral fracture, s/p IM nailing (06/19/20), PWB.    PT Comments    Patient alert, agreeable to PT, endorsed mild pain, exhibited mild pain signs/symptoms with mobility. Pt was able to perform supine exercises with light physical assist of LLE for comfort. Supine to sit with HOB and light minA for trunk elevation. Pt spent extended time in sitting to allow for dizziness to decrease, BP WFLs. Sit <> stand with RW and minA, step by step cueing for hand placement as well as cueing for PWB, with good adherence. Further mobility deferred due to RN in room to administer medication and pt fatigue. The patient would benefit from further skilled PT intervention to continue to progress towards goals. Recommendation remains appropriate.       Follow Up Recommendations  SNF     Equipment Recommendations  Rolling walker with 5" wheels    Recommendations for Other Services       Precautions / Restrictions Precautions Precautions: Fall Restrictions Weight Bearing Restrictions: Yes LLE Weight Bearing: Partial weight bearing    Mobility  Bed Mobility Overal bed mobility: Needs Assistance Bed Mobility: Supine to Sit           General bed mobility comments: very light minA for trunk elevation  Transfers Overall transfer level: Needs assistance Equipment used: Rolling walker (2 wheeled) Transfers: Sit to/from Stand Sit to Stand: Min assist         General transfer comment: assist for lift off and cueing for hand placement  Ambulation/Gait Ambulation/Gait assistance: Min guard Gait Distance (Feet): 5 Feet Assistive device: Rolling walker (2 wheeled)       General Gait Details:  step to gait pattern with step by step cueing to adhere to [facility] precautions   Stairs             Wheelchair Mobility    Modified Rankin (Stroke Patients Only)       Balance Overall balance assessment: Needs assistance Sitting-balance support: No upper extremity supported;Feet supported Sitting balance-Leahy Scale: Good     Standing balance support: Bilateral upper extremity supported Standing balance-Leahy Scale: Poor Standing balance comment: reliant on UE support                            Cognition Arousal/Alertness: Awake/alert Behavior During Therapy: WFL for tasks assessed/performed Overall Cognitive Status: Within Functional Limits for tasks assessed                                        Exercises Other Exercises Other Exercises: Supine LE therex, 1x10, ankle pumps, quad , heel slides, hip abduct/adduct. light AAROm for heel slides and hip abduction/adduciton to assist with pain management    General Comments        Pertinent Vitals/Pain Pain Assessment: Faces Faces Pain Scale: Hurts a little bit Pain Location: L hip Pain Descriptors / Indicators: Aching;Grimacing;Guarding Pain Intervention(s): Limited activity within patient's tolerance;Monitored during session;Repositioned    Home Living                      Prior Function  PT Goals (current goals can now be found in the care plan section) Progress towards PT goals: Progressing toward goals    Frequency    7X/week      PT Plan Current plan remains appropriate    Co-evaluation              AM-PAC PT "6 Clicks" Mobility   Outcome Measure  Help needed turning from your back to your side while in a flat bed without using bedrails?: A Little Help needed moving from lying on your back to sitting on the side of a flat bed without using bedrails?: A Lot Help needed moving to and from a bed to a chair (including a wheelchair)?: A Lot Help  needed standing up from a chair using your arms (e.g., wheelchair or bedside chair)?: A Lot Help needed to walk in hospital room?: A Little Help needed climbing 3-5 steps with a railing? : A Lot 6 Click Score: 14    End of Session Equipment Utilized During Treatment: Gait belt Activity Tolerance: Patient tolerated treatment well Patient left: in chair;with call bell/phone within reach;with chair alarm set Nurse Communication: Mobility status PT Visit Diagnosis: Muscle weakness (generalized) (M62.81);Other abnormalities of gait and mobility (R26.89);Pain Pain - Right/Left: Left Pain - part of body: Hip     Time: 8768-1157 PT Time Calculation (min) (ACUTE ONLY): 25 min  Charges:  $Therapeutic Exercise: 23-37 mins                     Lieutenant Diego PT, DPT 10:20 AM,06/21/20

## 2020-06-21 NOTE — Progress Notes (Signed)
Subjective: 2 Days Post-Op Procedure(s) (LRB): INTRAMEDULLARY (IM) NAIL INTERTROCHANTRIC (Left)   Change in there is sitting up in bed and eating.  She is alert.  She is oriented.  Minimal pain.  Dressing is dry.  She started PT.  Hemoglobin is stable.  Patient reports pain as mild.  Objective:   VITALS:   Vitals:   06/21/20 0816 06/21/20 1140  BP: (!) 152/64 (!) 148/47  Pulse: 80 83  Resp: 18 18  Temp: 99.4 F (37.4 C) 98.2 F (36.8 C)  SpO2: 97% 99%    Neurologically intact Intact pulses distally Incision: scant drainage  LABS Recent Labs    06/19/20 0505 06/20/20 0432 06/21/20 0402  HGB 10.4* 8.1* 8.1*  HCT 30.7* 25.1* 24.0*  WBC 7.9 8.2 8.5  PLT 87* 66* 79*    Recent Labs    06/19/20 0505 06/20/20 0432 06/21/20 0402  NA 137 139 138  K 3.6 3.5 3.5  BUN 20 22 18   CREATININE 0.78 0.68 0.66  GLUCOSE 130* 149* 140*    No results for input(s): LABPT, INR in the last 72 hours.   Assessment/Plan: 2 Days Post-Op Procedure(s) (LRB): INTRAMEDULLARY (IM) NAIL INTERTROCHANTRIC (Left)   Advance diet Up with therapy D/C IV fluids Discharge to SNF when stable.  Partial weightbearing left leg only. Enteric-coated aspirin twice a day for DVT prophylaxis Return to clinic 2 weeks for x-ray and staple removal

## 2020-06-21 NOTE — TOC Progression Note (Signed)
Transition of Care [facility]) - Progression Note    Patient Details  Name: [name] MRN: [id number] Date of Birth: 11/14/1927  [facility] CM/SW Contact  Shelbie Ammons, RN Phone Number: 06/21/2020, 1:02 PM  Clinical Narrative:   RNCM reached out to [facility] with [facility] and insurance authorization was started.     Expected Discharge Plan: [place] Barriers to Discharge: No Barriers Identified  Expected Discharge Plan and Services Expected Discharge Plan: [place] arrangements for the past 2 months: Apartment                                       Social Determinants of Health (SDOH) Interventions    Readmission Risk Interventions No flowsheet data found.

## 2020-06-21 NOTE — Care Management Important Message (Signed)
Important Message  Patient Details  Name: [name] MRN: [id number] Date of Birth: May 28, 1928   Medicare Important Message Given:  N/A - LOS <3 / Initial given by admissions  Initial Medicare IM reviewed with Janese Banks, son by Meredith Mody, Patient Access Associate on 06/20/2020 at 2:24pm.   Dannette Barbara 06/21/2020, 9:02 AM

## 2020-06-22 DIAGNOSIS — R2689 Other abnormalities of gait and mobility: Secondary | ICD-10-CM | POA: Diagnosis not present

## 2020-06-22 DIAGNOSIS — Z741 Need for assistance with personal care: Secondary | ICD-10-CM | POA: Diagnosis not present

## 2020-06-22 DIAGNOSIS — B0229 Other postherpetic nervous system involvement: Secondary | ICD-10-CM | POA: Diagnosis not present

## 2020-06-22 DIAGNOSIS — R279 Unspecified lack of coordination: Secondary | ICD-10-CM | POA: Diagnosis not present

## 2020-06-22 DIAGNOSIS — K219 Gastro-esophageal reflux disease without esophagitis: Secondary | ICD-10-CM | POA: Diagnosis not present

## 2020-06-22 DIAGNOSIS — S72002A Fracture of unspecified part of neck of left femur, initial encounter for closed fracture: Secondary | ICD-10-CM | POA: Diagnosis not present

## 2020-06-22 DIAGNOSIS — S72009A Fracture of unspecified part of neck of unspecified femur, initial encounter for closed fracture: Secondary | ICD-10-CM | POA: Diagnosis not present

## 2020-06-22 DIAGNOSIS — E440 Moderate protein-calorie malnutrition: Secondary | ICD-10-CM | POA: Diagnosis not present

## 2020-06-22 DIAGNOSIS — S72145D Nondisplaced intertrochanteric fracture of left femur, subsequent encounter for closed fracture with routine healing: Secondary | ICD-10-CM | POA: Diagnosis not present

## 2020-06-22 DIAGNOSIS — S72142A Displaced intertrochanteric fracture of left femur, initial encounter for closed fracture: Secondary | ICD-10-CM | POA: Diagnosis not present

## 2020-06-22 DIAGNOSIS — R278 Other lack of coordination: Secondary | ICD-10-CM | POA: Diagnosis not present

## 2020-06-22 DIAGNOSIS — W19XXXD Unspecified fall, subsequent encounter: Secondary | ICD-10-CM | POA: Diagnosis not present

## 2020-06-22 DIAGNOSIS — F39 Unspecified mood [affective] disorder: Secondary | ICD-10-CM | POA: Diagnosis not present

## 2020-06-22 DIAGNOSIS — R5381 Other malaise: Secondary | ICD-10-CM | POA: Diagnosis not present

## 2020-06-22 DIAGNOSIS — M6281 Muscle weakness (generalized): Secondary | ICD-10-CM | POA: Diagnosis not present

## 2020-06-22 DIAGNOSIS — S72142D Displaced intertrochanteric fracture of left femur, subsequent encounter for closed fracture with routine healing: Secondary | ICD-10-CM | POA: Diagnosis not present

## 2020-06-22 DIAGNOSIS — F419 Anxiety disorder, unspecified: Secondary | ICD-10-CM | POA: Diagnosis not present

## 2020-06-22 DIAGNOSIS — S7292XA Unspecified fracture of left femur, initial encounter for closed fracture: Secondary | ICD-10-CM | POA: Diagnosis not present

## 2020-06-22 DIAGNOSIS — I10 Essential (primary) hypertension: Secondary | ICD-10-CM | POA: Diagnosis not present

## 2020-06-22 DIAGNOSIS — F329 Major depressive disorder, single episode, unspecified: Secondary | ICD-10-CM | POA: Diagnosis not present

## 2020-06-22 LAB — RESP PANEL BY RT-PCR (FLU A&B, COVID) ARPGX2
Influenza A by PCR: NEGATIVE
Influenza B by PCR: NEGATIVE
SARS Coronavirus 2 by RT PCR: NEGATIVE

## 2020-06-22 LAB — CBC
HCT: 26 % — ABNORMAL LOW (ref 36.0–46.0)
Hemoglobin: 8.4 g/dL — ABNORMAL LOW (ref 12.0–15.0)
MCH: 32.1 pg (ref 26.0–34.0)
MCHC: 32.3 g/dL (ref 30.0–36.0)
MCV: 99.2 fL (ref 80.0–100.0)
Platelets: 115 10*3/uL — ABNORMAL LOW (ref 150–400)
RBC: 2.62 MIL/uL — ABNORMAL LOW (ref 3.87–5.11)
RDW: 14 % (ref 11.5–15.5)
WBC: 7 10*3/uL (ref 4.0–10.5)
nRBC: 0 % (ref 0.0–0.2)

## 2020-06-22 MED ORDER — AMLODIPINE BESYLATE 5 MG PO TABS: 5.0000 mg | ORAL_TABLET | Freq: Every day | ORAL | Status: DC

## 2020-06-22 MED ORDER — LORAZEPAM 1 MG PO TABS: 0.5000 mg | ORAL_TABLET | Freq: Two times a day (BID) | ORAL | 0 refills | Status: DC

## 2020-06-22 MED ORDER — ASPIRIN 81 MG PO TBEC: 81.0000 mg | DELAYED_RELEASE_TABLET | Freq: Two times a day (BID) | ORAL | 0 refills | Status: DC

## 2020-06-22 MED ORDER — FERROUS SULFATE 325 (65 FE) MG PO TABS: 325.0000 mg | ORAL_TABLET | Freq: Every day | ORAL | 3 refills | Status: DC

## 2020-06-22 MED ORDER — MORPHINE SULFATE 15 MG PO TABS: 15.0000 mg | ORAL_TABLET | ORAL | 0 refills | Status: DC | PRN

## 2020-06-22 NOTE — Progress Notes (Signed)
PT Cancellation Note  Patient Details Name: [name] MRN: [id number] DOB: [birth year]   Cancelled Treatment:    Reason Eval/Treat Not Completed: Other (comment). PT eating breakfast at this time, PT to re-attempt as able.  Lieutenant Diego PT, DPT 9:07 AM,06/22/20

## 2020-06-22 NOTE — Progress Notes (Signed)
Subjective: 3 Days Post-Op Procedure(s) (LRB): INTRAMEDULLARY (IM) NAIL INTERTROCHANTRIC (Left)   Patient is awake and alert.  Minimal pain she says.  Hemoglobin remained stable.  Started PT.  Patient reports pain as mild.  Objective:   VITALS:   Vitals:   06/22/20 0805 06/22/20 1123  BP: (!) 141/56 (!) 145/53  Pulse: 69 79  Resp: 16 15  Temp: 97.9 F (36.6 C) 98.3 F (36.8 C)  SpO2: 98% 97%    Neurologically intact Dorsiflexion/Plantar flexion intact Incision: scant drainage  LABS Recent Labs    06/20/20 0432 06/21/20 0402 06/22/20 0938  HGB 8.1* 8.1* 8.4*  HCT 25.1* 24.0* 26.0*  WBC 8.2 8.5 7.0  PLT 66* 79* 115*    Recent Labs    06/20/20 0432 06/21/20 0402  NA 139 138  K 3.5 3.5  BUN 22 18  CREATININE 0.68 0.66  GLUCOSE 149* 140*    No results for input(s): LABPT, INR in the last 72 hours.   Assessment/Plan: 3 Days Post-Op Procedure(s) (LRB): INTRAMEDULLARY (IM) NAIL INTERTROCHANTRIC (Left)   Advance diet Up with therapy Discharge to SNF when stable.  Enteric-coated aspirin twice a day on discharge Partial weightbearing left leg only

## 2020-06-22 NOTE — Progress Notes (Signed)
Report given to Jess as [facility]

## 2020-06-22 NOTE — Discharge Summary (Signed)
Physician Discharge Summary  [name] XAJ:[id number] DOB: [birth year] DOA: 06/18/2020  PCP: Venia Carbon, MD  Admit date: 06/18/2020 Discharge date: 06/22/2020  Admitted From: Home Disposition:  SNF  Discharge Condition:Stable CODE STATUS:FULL Diet recommendation:  Dysphagia 3  Brief/Interim Summary: [name] is a 86 y.o. 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  She was brought to the hospital after a fall at home.  Reportedly, she blacked out and fell.  She uses a cane at home and apparently she had her cane with her when she fell. She was found to have moderately displaced and comminuted intertrochanteric fracture of the proximal left femur.  She was treated with analgesics.  Orthopedic surgeon was consulted and she underwent intramedullary nail intertrochanteric left hip fracture on 06/19/2020.  She was evaluated by PT and OT recommended further rehabilitation at the skilled nursing facility.  Patient is medically stable for discharge to skilled nursing facility as soon as bed is available.  Following problems were addressed during her hospitalization:   Left intertrochanteric fracture of left femur, s/p fall: Continue analgesics as needed for pain.  S/p intramedullary nail intertrochanteric left hip fracture on 06/19/2020.  Orthopedic surgeon recommends enteric-coated aspirin twice a day for 6 weeks for DVT prophylaxis on discharge.  Follow-up with orthopedic surgeon in 2 weeks  S/p ?syncope: 2D echo showed EF estimated at 60 to 65%, mild LVH and grade 1 diastolic dysfunction. Outpatient follow-up for Zio patch cardiac monitoring is recommended.  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 3)Check CBC in a week   Increase activity slowly   Complete by: As directed    No wound care   Complete by: As directed      Allergies as of 06/22/2020      Reactions   Duloxetine    REACTION: N/V Mental status change and trouble with balance   Cymbalta [duloxetine Hcl]    Unsure of reaction   Maxitrol [neomycin-polymyxin-dexameth]    Unsure of reaction   Pregabalin    REACTION: SOB, swollen lips   Tobradex [tobramycin-dexamethasone]    Unsure of reaction      Medication List    STOP taking these medications   acyclovir 400 MG tablet Commonly known as: ZOVIRAX     TAKE these medications   amLODipine 5 MG tablet Commonly known as: NORVASC Take 1 tablet (5 mg total) by mouth daily. Start taking on: June 23, 2020   aspirin 81 MG EC tablet Take 1 tablet (81 mg total) by mouth in the morning and at bedtime. What changed: when to take this   azelastine 0.1 % nasal spray Commonly known as: ASTELIN Place 2 sprays into both nostrils 2 (two)  times daily.   ferrous sulfate 325 (65 FE) MG tablet Take 1 tablet (325 mg total) by mouth daily with breakfast. Start taking on: June 23, 2020   fexofenadine 180 MG tablet Commonly known as: ALLEGRA Take 180 mg by mouth daily.   fluticasone 50 MCG/ACT nasal spray Commonly known as: FLONASE PLACE 2 SPRAYS INTO EACH NOSTRIL ONCE DAILY What changed:  See the new instructions.   hydrocortisone 2.5 % cream Apply topically 3 (three) times daily as needed.   hydrocortisone 2.5 % cream Apply to face, ears 1-2 times a day as directed.   ketoconazole 2 % shampoo Commonly known as: NIZORAL Massage into scalp and behind ears 1-2 times a week, let sit 10 minutes before rinsing.   ketoconazole 2 % cream Commonly known as: NIZORAL Apply to affected areas face and frontal scalp 1-2 times a day as directed.   LORazepam 1 MG tablet Commonly known as: ATIVAN Take 0.5-1 tablets (0.5-1 mg total) by mouth 2 (two) times daily. TAKE 1/2 TO ONE TABLET BY MOUTH TWICE DAILY AS NEEDED FOR ANXIETY What changed: See the new instructions.   morphine 15 MG tablet Commonly known as: MSIR Take 1 tablet (15 mg total) by mouth every 3 (three) hours as needed for severe pain. TAKE 1 TABLET BY MOUTH EVERY 3 HOURS AS NEEDED FOR PAIN What changed: See the new instructions.   multivitamin tablet Take 1 tablet by mouth once daily   NONFORMULARY OR COMPOUNDED ITEM See pharmacy note   pantoprazole 40 MG tablet Commonly known as: PROTONIX Take 1 tablet (40 mg total) by mouth 2 (two) times daily.   polyethylene glycol 17 g packet Commonly known as: MIRALAX / GLYCOLAX Take 17 g by mouth at bedtime.   Restasis 0.05 % ophthalmic emulsion Generic drug: cycloSPORINE Place 1 drop into both eyes 2 (two) times daily.   topiramate 100 MG tablet Commonly known as: TOPAMAX TAKE 1 TABLET BY MOUTH TWICE DAILY       Contact information for follow-up providers    Earnestine Leys, MD. Schedule an appointment as soon as possible for a visit on 07/11/2020.   Specialty: Orthopedic Surgery Why:  AT 1PM Contact information: [place] [address] [phone]            Contact information for after-discharge care    Destination    [facility] .   Service: Skilled Nursing Contact information: [place] [address] [phone]                 Allergies  Allergen Reactions  . Duloxetine     REACTION: N/V Mental status change and trouble with balance  . Cymbalta [Duloxetine Hcl]     Unsure of reaction  . Maxitrol [Neomycin-Polymyxin-Dexameth]     Unsure of reaction  . Pregabalin     REACTION: SOB, swollen lips  . Tobradex [Tobramycin-Dexamethasone]     Unsure of reaction    Consultations:  Orthopedics, cardiology   Procedures/Studies: ECHOCARDIOGRAM COMPLETE  Result Date: 06/19/2020    ECHOCARDIOGRAM REPORT   Patient Name:   [name] Date of Exam: 06/19/2020 Medical Rec #:  [id number]      Height:       60.0 in Accession #:    [id number]     Weight:       81.0 lb Date of Birth:  09-20-1927      BSA:  1.271 m Patient Age:    86 years       BP:           123/99 mmHg Patient Gender: F              HR:           79 bpm. Exam Location:  [facility] Procedure: 2D Echo Indications:     Syncope R55  History:         Patient has prior history of Echocardiogram examinations, most                  recent 12/27/2017.  Sonographer:     Arville Go RDCS Referring Phys:  [id number] AGBATA Diagnosing Phys: Ida Rogue MD  Sonographer Comments: Technically challenging study due to limited acoustic windows and Technically difficult study due to poor echo windows. Image acquisition challenging due to breast implants. IMPRESSIONS  1. Left ventricular ejection fraction, by estimation, is 60 to 65%. The left ventricle has normal function. The left ventricle has no regional wall motion abnormalities. There is mild left ventricular hypertrophy. Left ventricular diastolic parameters are consistent with Grade I diastolic dysfunction (impaired relaxation).  2. Right ventricular systolic function is normal. The right ventricular size is normal. Tricuspid regurgitation signal is inadequate for assessing PA pressure. FINDINGS  Left Ventricle: Left ventricular ejection  fraction, by estimation, is 60 to 65%. The left ventricle has normal function. The left ventricle has no regional wall motion abnormalities. The left ventricular internal cavity size was normal in size. There is  mild left ventricular hypertrophy. Left ventricular diastolic parameters are consistent with Grade I diastolic dysfunction (impaired relaxation). Right Ventricle: The right ventricular size is normal. No increase in right ventricular wall thickness. Right ventricular systolic function is normal. Tricuspid regurgitation signal is inadequate for assessing PA pressure. Left Atrium: Left atrial size was normal in size. Right Atrium: Right atrial size was normal in size. Pericardium: There is no evidence of pericardial effusion. Mitral Valve: The mitral valve is normal in structure. No evidence of mitral valve regurgitation. No evidence of mitral valve stenosis. Tricuspid Valve: The tricuspid valve is normal in structure. Tricuspid valve regurgitation is not demonstrated. No evidence of tricuspid stenosis. Aortic Valve: The aortic valve was not well visualized. Aortic valve regurgitation is not visualized. No aortic stenosis is present. Aortic valve peak gradient measures 3.9 mmHg. Pulmonic Valve: The pulmonic valve was normal in structure. Pulmonic valve regurgitation is not visualized. No evidence of pulmonic stenosis. Aorta: The aortic root is normal in size and structure. Venous: The inferior vena cava is normal in size with greater than 50% respiratory variability, suggesting right atrial pressure of 3 mmHg. IAS/Shunts: No atrial level shunt detected by color flow Doppler.  LEFT VENTRICLE PLAX 2D LVIDd:         3.62 cm  Diastology LVIDs:         2.43 cm  LV e' medial:    2.72 cm/s LV PW:         1.11 cm  LV E/e' medial:  21.0 LV IVS:        0.86 cm  LV e' lateral:   8.05 cm/s LVOT diam:     1.70 cm  LV E/e' lateral: 7.1 LVOT Area:     2.27 cm  RIGHT VENTRICLE RV Basal diam:  2.92 cm LEFT ATRIUM            Index LA diam:      3.20 cm 2.52  cm/m LA Vol (A4C): 9.4 ml  7.40 ml/m  AORTIC VALVE AV Vmax:      99.10 cm/s AV Peak Grad: 3.9 mmHg  AORTA Ao Root diam: 2.50 cm MITRAL VALVE               TRICUSPID VALVE MV Area (PHT): 4.17 cm    TV Peak grad:   19.4 mmHg MV Decel Time: 182 msec    TV Vmax:        2.20 m/s MV E velocity: 57.00 cm/s MV A velocity: 84.00 cm/s  SHUNTS MV E/A ratio:  0.68        Systemic Diam: 1.70 cm Ida Rogue MD Electronically signed by Ida Rogue MD Signature Date/Time: 06/19/2020/2:17:08 PM    Final    DG HIP OPERATIVE UNILAT W OR W/O PELVIS LEFT  Result Date: 06/19/2020 CLINICAL DATA:  Internal fixation EXAM: OPERATIVE LEFT HIP (WITH PELVIS IF PERFORMED) 5 VIEWS TECHNIQUE: Fluoroscopic spot image(s) were submitted for interpretation post-operatively. COMPARISON:  06/18/2020 FINDINGS: Changes of internal fixation across the left femoral intertrochanteric fracture. Anatomic alignment. No hardware complicating feature. IMPRESSION: Internal fixation.  No complicating feature. Electronically Signed   By: Rolm Baptise M.D.   On: 06/19/2020 19:26   DG Hip Unilat With Pelvis 2-3 Views Left  Result Date: 06/18/2020 CLINICAL DATA:  Left leg pain after fall. EXAM: DG HIP (WITH OR WITHOUT PELVIS) 2-3V LEFT COMPARISON:  January 20, 2013. FINDINGS: Moderately displaced and comminuted fracture is seen involving the intertrochanteric region of the proximal left femur. Sclerotic focus is noted in the proximal left femur which was present on prior exam and is most consistent with benign enostosis. IMPRESSION: Moderately displaced and comminuted intertrochanteric fracture of proximal left femur. Electronically Signed   By: Marijo Conception M.D.   On: 06/18/2020 11:59       Subjective: Patient seen and examined at bedside this morning.  Hemodynamically stable for discharge  Discharge Exam: Vitals:   06/22/20 0432 06/22/20 0805  BP: (!) 125/54 (!) 141/56  Pulse: 73 69  Resp: 16 16   Temp: 98.4 F (36.9 C) 97.9 F (36.6 C)  SpO2: 96% 98%   Vitals:   06/21/20 1921 06/22/20 0010 06/22/20 0432 06/22/20 0805  BP: (!) 141/52 132/68 (!) 125/54 (!) 141/56  Pulse: 83 83 73 69  Resp: 18  16 16   Temp: 98.9 F (37.2 C) 98.9 F (37.2 C) 98.4 F (36.9 C) 97.9 F (36.6 C)  TempSrc: Oral Oral Oral Oral  SpO2: 96% 97% 96% 98%  Weight:      Height:        General: Pt is alert, awake, not in acute distress Cardiovascular: RRR, S1/S2 +, no rubs, no gallops Respiratory: CTA bilaterally, no wheezing, no rhonchi Abdominal: Soft, NT, ND, bowel sounds + Extremities: no edema, no cyanosis, clean surgical wound on the left hip    The results of significant diagnostics from this hospitalization (including imaging, microbiology, ancillary and laboratory) are listed below for reference.     Microbiology: Recent Results (from the past 240 hour(s))  Resp Panel by RT-PCR (Flu A&B, Covid) Nasopharyngeal Swab     Status: None   Collection Time: 06/18/20 11:09 AM   Specimen: Nasopharyngeal Swab; Nasopharyngeal(NP) swabs in vial transport medium  Result Value Ref Range Status   SARS Coronavirus 2 by RT PCR NEGATIVE NEGATIVE Final    Comment: (NOTE) SARS-CoV-2 target nucleic acids are NOT DETECTED.  The SARS-CoV-2 RNA is generally detectable in upper respiratory  specimens during the acute phase of infection. The lowest concentration of SARS-CoV-2 viral copies this assay can detect is 138 copies/mL. A negative result does not preclude SARS-Cov-2 infection and should not be used as the sole basis for treatment or other patient management decisions. A negative result may occur with  improper specimen collection/handling, submission of specimen other than nasopharyngeal swab, presence of viral mutation(s) within the areas targeted by this assay, and inadequate number of viral copies(<138 copies/mL). A negative result must be combined with clinical observations, patient history, and  epidemiological information. The expected result is Negative.  Fact Sheet for Patients:  EntrepreneurPulse.com.au  Fact Sheet for Healthcare Providers:  IncredibleEmployment.be  This test is no t yet approved or cleared by the [place] FDA and  has been authorized for detection and/or diagnosis of SARS-CoV-2 by FDA under an Emergency Use Authorization (EUA). This EUA will remain  in effect (meaning this test can be used) for the duration of the COVID-19 declaration under Section 564(b)(1) of the Act, 21 U.S.C.section 360bbb-3(b)(1), unless the authorization is terminated  or revoked sooner.       Influenza A by PCR NEGATIVE NEGATIVE Final   Influenza B by PCR NEGATIVE NEGATIVE Final    Comment: (NOTE) The Xpert Xpress SARS-CoV-2/FLU/RSV plus assay is intended as an aid in the diagnosis of influenza from Nasopharyngeal swab specimens and should not be used as a sole basis for treatment. Nasal washings and aspirates are unacceptable for Xpert Xpress SARS-CoV-2/FLU/RSV testing.  Fact Sheet for Patients: EntrepreneurPulse.com.au  Fact Sheet for Healthcare Providers: IncredibleEmployment.be  This test is not yet approved or cleared by the [place] FDA and has been authorized for detection and/or diagnosis of SARS-CoV-2 by FDA under an Emergency Use Authorization (EUA). This EUA will remain in effect (meaning this test can be used) for the duration of the COVID-19 declaration under Section 564(b)(1) of the Act, 21 U.S.C. section 360bbb-3(b)(1), unless the authorization is terminated or revoked.  Performed at [facility], [address], [place], [place] [address]   Surgical pcr screen     Status: Abnormal   Collection Time: 06/19/20  5:53 AM   Specimen: Nasal Mucosa; Nasal Swab  Result Value Ref Range Status   MRSA, PCR NEGATIVE NEGATIVE Final   Staphylococcus aureus POSITIVE (A)  NEGATIVE Final    Comment: (NOTE) The Xpert SA Assay (FDA approved for NASAL specimens in patients 56 years of age and older), is one component of a comprehensive surveillance program. It is not intended to diagnose infection nor to guide or monitor treatment. Performed at [facility], [address] [place],  [address]      Labs: BNP (last 3 results) No results for input(s): BNP in the last 8760 hours. Basic Metabolic Panel: Recent Labs  Lab 06/18/20 1109 06/19/20 0505 06/19/20 1255 06/20/20 0432 06/21/20 0402  NA 140 137  --  139 138  K 3.8 3.6  --  3.5 3.5  CL 108 106  --  110 109  CO2 23 24  --  22 23  GLUCOSE 139* 130*  --  149* 140*  BUN 23 20  --  22 18  CREATININE 0.70 0.78  --  0.68 0.66  CALCIUM 8.7* 8.3*  --  7.8* 7.8*  MG  --   --  1.9  --  1.8  PHOS  --   --  3.7  --  2.1*   Liver Function Tests: No results for input(s): AST, ALT, ALKPHOS,  BILITOT, PROT, ALBUMIN in the last 168 hours. No results for input(s): LIPASE, AMYLASE in the last 168 hours. No results for input(s): AMMONIA in the last 168 hours. CBC: Recent Labs  Lab 06/18/20 1109 06/19/20 0505 06/20/20 0432 06/21/20 0402 06/22/20 0938  WBC 8.6 7.9 8.2 8.5 7.0  NEUTROABS 6.2  --  6.5 7.1  --   HGB 12.2 10.4* 8.1* 8.1* 8.4*  HCT 37.6 30.7* 25.1* 24.0* 26.0*  MCV 98.7 95.6 98.0 96.4 99.2  PLT 130* 87* 66* 79* 115*   Cardiac Enzymes: No results for input(s): CKTOTAL, CKMB, CKMBINDEX, TROPONINI in the last 168 hours. BNP: Invalid input(s): POCBNP CBG: No results for input(s): GLUCAP in the last 168 hours. D-Dimer No results for input(s): DDIMER in the last 72 hours. Hgb A1c No results for input(s): HGBA1C in the last 72 hours. Lipid Profile No results for input(s): CHOL, HDL, LDLCALC, TRIG, CHOLHDL, LDLDIRECT in the last 72 hours. Thyroid function studies No results for input(s): TSH, T4TOTAL, T3FREE, THYROIDAB in the last 72 hours.  Invalid input(s): FREET3 Anemia  work up No results for input(s): VITAMINB12, FOLATE, FERRITIN, TIBC, IRON, RETICCTPCT in the last 72 hours. Urinalysis    Component Value Date/Time   COLORURINE YELLOW (A) 06/19/2020 1832   APPEARANCEUR CLEAR (A) 06/19/2020 1832   APPEARANCEUR Clear 01/22/2013 0235   LABSPEC 1.014 06/19/2020 1832   LABSPEC 1.002 01/22/2013 0235   PHURINE 6.0 06/19/2020 1832   GLUCOSEU NEGATIVE 06/19/2020 1832   GLUCOSEU Negative 01/22/2013 0235   HGBUR NEGATIVE 06/19/2020 1832   HGBUR negative 11/09/2008 1141   BILIRUBINUR NEGATIVE 06/19/2020 1832   BILIRUBINUR negative 04/17/2018 1453   BILIRUBINUR Negative 01/22/2013 0235   KETONESUR 5 (A) 06/19/2020 1832   PROTEINUR NEGATIVE 06/19/2020 1832   UROBILINOGEN 0.2 04/17/2018 1453   UROBILINOGEN 0.2 05/05/2011 1400   NITRITE NEGATIVE 06/19/2020 1832   LEUKOCYTESUR NEGATIVE 06/19/2020 1832   LEUKOCYTESUR Negative 01/22/2013 0235   Sepsis Labs Invalid input(s): PROCALCITONIN,  WBC,  LACTICIDVEN Microbiology Recent Results (from the past 240 hour(s))  Resp Panel by RT-PCR (Flu A&B, Covid) Nasopharyngeal Swab     Status: None   Collection Time: 06/18/20 11:09 AM   Specimen: Nasopharyngeal Swab; Nasopharyngeal(NP) swabs in vial transport medium  Result Value Ref Range Status   SARS Coronavirus 2 by RT PCR NEGATIVE NEGATIVE Final    Comment: (NOTE) SARS-CoV-2 target nucleic acids are NOT DETECTED.  The SARS-CoV-2 RNA is generally detectable in upper respiratory specimens during the acute phase of infection. The lowest concentration of SARS-CoV-2 viral copies this assay can detect is 138 copies/mL. A negative result does not preclude SARS-Cov-2 infection and should not be used as the sole basis for treatment or other patient management decisions. A negative result may occur with  improper specimen collection/handling, submission of specimen other than nasopharyngeal swab, presence of viral mutation(s) within the areas targeted by this assay, and  inadequate number of viral copies(<138 copies/mL). A negative result must be combined with clinical observations, patient history, and epidemiological information. The expected result is Negative.  Fact Sheet for Patients:  EntrepreneurPulse.com.au  Fact Sheet for Healthcare Providers:  IncredibleEmployment.be  This test is no t yet approved or cleared by the [place] FDA and  has been authorized for detection and/or diagnosis of SARS-CoV-2 by FDA under an Emergency Use Authorization (EUA). This EUA will remain  in effect (meaning this test can be used) for the duration of the COVID-19 declaration under Section 564(b)(1) of the Act, 21 U.S.C.section  360bbb-3(b)(1), unless the authorization is terminated  or revoked sooner.       Influenza A by PCR NEGATIVE NEGATIVE Final   Influenza B by PCR NEGATIVE NEGATIVE Final    Comment: (NOTE) The Xpert Xpress SARS-CoV-2/FLU/RSV plus assay is intended as an aid in the diagnosis of influenza from Nasopharyngeal swab specimens and should not be used as a sole basis for treatment. Nasal washings and aspirates are unacceptable for Xpert Xpress SARS-CoV-2/FLU/RSV testing.  Fact Sheet for Patients: EntrepreneurPulse.com.au  Fact Sheet for Healthcare Providers: IncredibleEmployment.be  This test is not yet approved or cleared by the [place] FDA and has been authorized for detection and/or diagnosis of SARS-CoV-2 by FDA under an Emergency Use Authorization (EUA). This EUA will remain in effect (meaning this test can be used) for the duration of the COVID-19 declaration under Section 564(b)(1) of the Act, 21 U.S.C. section 360bbb-3(b)(1), unless the authorization is terminated or revoked.  Performed at [facility], [address] [place], [place] [address]   Surgical pcr screen     Status: Abnormal   Collection Time: 06/19/20  5:53 AM    Specimen: Nasal Mucosa; Nasal Swab  Result Value Ref Range Status   MRSA, PCR NEGATIVE NEGATIVE Final   Staphylococcus aureus POSITIVE (A) NEGATIVE Final    Comment: (NOTE) The Xpert SA Assay (FDA approved for NASAL specimens in patients 42 years of age and older), is one component of a comprehensive surveillance program. It is not intended to diagnose infection nor to guide or monitor treatment. Performed at [facility], [address] [place], [place] [address]     Please note: You were cared for by a hospitalist during your hospital stay. Once you are discharged, your primary care physician will handle any further medical issues. Please note that NO REFILLS for any discharge medications will be authorized once you are discharged, as it is imperative that you return to your primary care physician (or establish a relationship with a primary care physician if you do not have one) for your post hospital discharge needs so that they can reassess your need for medications and monitor your lab values.    Time coordinating discharge: 40 minutes  SIGNED:   Shelly Coss, MD  [facility] 06/22/2020, 10:32 AM Pager [phone]  If 7PM-7AM, please contact night-coverage [email or web address] Password [email or web address]

## 2020-06-23 DIAGNOSIS — K219 Gastro-esophageal reflux disease without esophagitis: Secondary | ICD-10-CM

## 2020-06-23 DIAGNOSIS — B0229 Other postherpetic nervous system involvement: Secondary | ICD-10-CM

## 2020-06-23 DIAGNOSIS — E440 Moderate protein-calorie malnutrition: Secondary | ICD-10-CM

## 2020-06-23 DIAGNOSIS — S7292XA Unspecified fracture of left femur, initial encounter for closed fracture: Secondary | ICD-10-CM

## 2020-06-23 DIAGNOSIS — F39 Unspecified mood [affective] disorder: Secondary | ICD-10-CM

## 2020-06-23 DIAGNOSIS — I10 Essential (primary) hypertension: Secondary | ICD-10-CM

## 2020-07-04 ENCOUNTER — Encounter: Payer: PPO | Admitting: Internal Medicine

## 2020-07-11 DIAGNOSIS — S72002A Fracture of unspecified part of neck of left femur, initial encounter for closed fracture: Secondary | ICD-10-CM | POA: Diagnosis not present

## 2020-07-11 DIAGNOSIS — S72142A Displaced intertrochanteric fracture of left femur, initial encounter for closed fracture: Secondary | ICD-10-CM | POA: Diagnosis not present

## 2020-07-19 DIAGNOSIS — Z9013 Acquired absence of bilateral breasts and nipples: Secondary | ICD-10-CM | POA: Diagnosis not present

## 2020-07-19 DIAGNOSIS — E785 Hyperlipidemia, unspecified: Secondary | ICD-10-CM | POA: Diagnosis not present

## 2020-07-19 DIAGNOSIS — H540X53 Blindness right eye category 5, blindness left eye category 3: Secondary | ICD-10-CM | POA: Diagnosis not present

## 2020-07-19 DIAGNOSIS — I2510 Atherosclerotic heart disease of native coronary artery without angina pectoris: Secondary | ICD-10-CM | POA: Diagnosis not present

## 2020-07-19 DIAGNOSIS — K219 Gastro-esophageal reflux disease without esophagitis: Secondary | ICD-10-CM | POA: Diagnosis not present

## 2020-07-19 DIAGNOSIS — B0239 Other herpes zoster eye disease: Secondary | ICD-10-CM | POA: Diagnosis not present

## 2020-07-19 DIAGNOSIS — F1120 Opioid dependence, uncomplicated: Secondary | ICD-10-CM | POA: Diagnosis not present

## 2020-07-19 DIAGNOSIS — F32A Depression, unspecified: Secondary | ICD-10-CM | POA: Diagnosis not present

## 2020-07-19 DIAGNOSIS — I10 Essential (primary) hypertension: Secondary | ICD-10-CM | POA: Diagnosis not present

## 2020-07-19 DIAGNOSIS — Z96653 Presence of artificial knee joint, bilateral: Secondary | ICD-10-CM | POA: Diagnosis not present

## 2020-07-19 DIAGNOSIS — Z7982 Long term (current) use of aspirin: Secondary | ICD-10-CM | POA: Diagnosis not present

## 2020-07-19 DIAGNOSIS — Z9181 History of falling: Secondary | ICD-10-CM | POA: Diagnosis not present

## 2020-07-19 DIAGNOSIS — F419 Anxiety disorder, unspecified: Secondary | ICD-10-CM | POA: Diagnosis not present

## 2020-07-19 DIAGNOSIS — Z87891 Personal history of nicotine dependence: Secondary | ICD-10-CM | POA: Diagnosis not present

## 2020-07-19 DIAGNOSIS — E440 Moderate protein-calorie malnutrition: Secondary | ICD-10-CM | POA: Diagnosis not present

## 2020-07-19 DIAGNOSIS — S72145D Nondisplaced intertrochanteric fracture of left femur, subsequent encounter for closed fracture with routine healing: Secondary | ICD-10-CM | POA: Diagnosis not present

## 2020-07-19 DIAGNOSIS — Z853 Personal history of malignant neoplasm of breast: Secondary | ICD-10-CM | POA: Diagnosis not present

## 2020-07-20 ENCOUNTER — Telehealth: Payer: Self-pay

## 2020-07-20 NOTE — Telephone Encounter (Signed)
Colletta Maryland OT with [facility] left v/m requesting verbal orders for [facility] OT 1 x a wk for 1 wk;  2 x a wk for 2 wks;  And 1 x a wk for 1 wk.

## 2020-07-20 NOTE — Telephone Encounter (Signed)
That is fine 

## 2020-07-21 NOTE — Telephone Encounter (Signed)
Orders left on verified VM 

## 2020-07-22 ENCOUNTER — Ambulatory Visit: Payer: PPO | Admitting: Internal Medicine

## 2020-07-23 ENCOUNTER — Other Ambulatory Visit: Payer: Self-pay | Admitting: Internal Medicine

## 2020-07-23 NOTE — Telephone Encounter (Signed)
Name of [place] Name of Pharmacy:[facility] Last [place] or Written Date and Quantity:06-22-20#150 Last Office Visit and Type:02-22-20 Next Office Visit and Type:08-01-20 Last Controlled Substance Agreement Date:04-27-19 Last UDS:04-27-19  Lorazepam last filled 06-22-20 #60

## 2020-07-25 ENCOUNTER — Ambulatory Visit: Payer: PPO | Admitting: Internal Medicine

## 2020-07-25 DIAGNOSIS — E440 Moderate protein-calorie malnutrition: Secondary | ICD-10-CM | POA: Diagnosis not present

## 2020-07-25 DIAGNOSIS — H540X53 Blindness right eye category 5, blindness left eye category 3: Secondary | ICD-10-CM | POA: Diagnosis not present

## 2020-07-25 DIAGNOSIS — F419 Anxiety disorder, unspecified: Secondary | ICD-10-CM | POA: Diagnosis not present

## 2020-07-25 DIAGNOSIS — Z9181 History of falling: Secondary | ICD-10-CM | POA: Diagnosis not present

## 2020-07-25 DIAGNOSIS — F1120 Opioid dependence, uncomplicated: Secondary | ICD-10-CM | POA: Diagnosis not present

## 2020-07-25 DIAGNOSIS — Z853 Personal history of malignant neoplasm of breast: Secondary | ICD-10-CM | POA: Diagnosis not present

## 2020-07-25 DIAGNOSIS — K219 Gastro-esophageal reflux disease without esophagitis: Secondary | ICD-10-CM | POA: Diagnosis not present

## 2020-07-25 DIAGNOSIS — S72145D Nondisplaced intertrochanteric fracture of left femur, subsequent encounter for closed fracture with routine healing: Secondary | ICD-10-CM | POA: Diagnosis not present

## 2020-07-25 DIAGNOSIS — Z87891 Personal history of nicotine dependence: Secondary | ICD-10-CM | POA: Diagnosis not present

## 2020-07-25 DIAGNOSIS — Z96653 Presence of artificial knee joint, bilateral: Secondary | ICD-10-CM | POA: Diagnosis not present

## 2020-07-25 DIAGNOSIS — Z7982 Long term (current) use of aspirin: Secondary | ICD-10-CM | POA: Diagnosis not present

## 2020-07-25 DIAGNOSIS — I10 Essential (primary) hypertension: Secondary | ICD-10-CM | POA: Diagnosis not present

## 2020-07-25 DIAGNOSIS — Z9013 Acquired absence of bilateral breasts and nipples: Secondary | ICD-10-CM | POA: Diagnosis not present

## 2020-07-25 DIAGNOSIS — F32A Depression, unspecified: Secondary | ICD-10-CM | POA: Diagnosis not present

## 2020-07-25 DIAGNOSIS — I2510 Atherosclerotic heart disease of native coronary artery without angina pectoris: Secondary | ICD-10-CM | POA: Diagnosis not present

## 2020-07-25 DIAGNOSIS — B0239 Other herpes zoster eye disease: Secondary | ICD-10-CM | POA: Diagnosis not present

## 2020-07-25 DIAGNOSIS — E785 Hyperlipidemia, unspecified: Secondary | ICD-10-CM | POA: Diagnosis not present

## 2020-08-01 ENCOUNTER — Ambulatory Visit: Payer: PPO | Admitting: Internal Medicine

## 2020-08-09 DIAGNOSIS — Z7982 Long term (current) use of aspirin: Secondary | ICD-10-CM | POA: Diagnosis not present

## 2020-08-09 DIAGNOSIS — Z96653 Presence of artificial knee joint, bilateral: Secondary | ICD-10-CM | POA: Diagnosis not present

## 2020-08-09 DIAGNOSIS — K219 Gastro-esophageal reflux disease without esophagitis: Secondary | ICD-10-CM | POA: Diagnosis not present

## 2020-08-09 DIAGNOSIS — F32A Depression, unspecified: Secondary | ICD-10-CM | POA: Diagnosis not present

## 2020-08-09 DIAGNOSIS — S72145D Nondisplaced intertrochanteric fracture of left femur, subsequent encounter for closed fracture with routine healing: Secondary | ICD-10-CM | POA: Diagnosis not present

## 2020-08-09 DIAGNOSIS — F419 Anxiety disorder, unspecified: Secondary | ICD-10-CM | POA: Diagnosis not present

## 2020-08-09 DIAGNOSIS — B0239 Other herpes zoster eye disease: Secondary | ICD-10-CM | POA: Diagnosis not present

## 2020-08-09 DIAGNOSIS — Z9013 Acquired absence of bilateral breasts and nipples: Secondary | ICD-10-CM | POA: Diagnosis not present

## 2020-08-09 DIAGNOSIS — I10 Essential (primary) hypertension: Secondary | ICD-10-CM | POA: Diagnosis not present

## 2020-08-09 DIAGNOSIS — Z9181 History of falling: Secondary | ICD-10-CM | POA: Diagnosis not present

## 2020-08-09 DIAGNOSIS — Z87891 Personal history of nicotine dependence: Secondary | ICD-10-CM | POA: Diagnosis not present

## 2020-08-09 DIAGNOSIS — H540X53 Blindness right eye category 5, blindness left eye category 3: Secondary | ICD-10-CM | POA: Diagnosis not present

## 2020-08-09 DIAGNOSIS — E785 Hyperlipidemia, unspecified: Secondary | ICD-10-CM | POA: Diagnosis not present

## 2020-08-09 DIAGNOSIS — I2510 Atherosclerotic heart disease of native coronary artery without angina pectoris: Secondary | ICD-10-CM | POA: Diagnosis not present

## 2020-08-09 DIAGNOSIS — Z853 Personal history of malignant neoplasm of breast: Secondary | ICD-10-CM | POA: Diagnosis not present

## 2020-08-09 DIAGNOSIS — E440 Moderate protein-calorie malnutrition: Secondary | ICD-10-CM | POA: Diagnosis not present

## 2020-08-09 DIAGNOSIS — F1120 Opioid dependence, uncomplicated: Secondary | ICD-10-CM | POA: Diagnosis not present

## 2020-08-16 ENCOUNTER — Ambulatory Visit: Payer: PPO | Admitting: Dermatology

## 2020-08-18 DIAGNOSIS — S72142A Displaced intertrochanteric fracture of left femur, initial encounter for closed fracture: Secondary | ICD-10-CM | POA: Diagnosis not present

## 2020-08-22 ENCOUNTER — Telehealth: Payer: Self-pay

## 2020-08-22 NOTE — Telephone Encounter (Addendum)
I spoke with patient; pt said this happened 15 - 20 mins ago with ear ache and dizziness but pt said she is better and OK now. No fever,chills,H/A, cough SOB,no respiratory symptoms, no diarrhea or vomiting. Pt said she is eating supper. UC & ED precautions given and pt voiced understanding. Pt already has appt on 08/24/20 at 3pm with Dr Glori Bickers. I tried to reach [facility] also but no answer on her cell. I spoke with Pams husband who is Mrs Hettinger son and they just left pt and pt assured them she was OK. Pt has had dizziness on and off for few days. Mr Scholle thinks pt just got up to quick; Mr Ohaver said they will be cking on pt this evening and understands [facility] & ED precautions. Sending note to DR Silvio Pate as PCP, Dr Glori Bickers and Larene Beach CMA.

## 2020-08-22 NOTE — Telephone Encounter (Signed)
[facility] - Client TELEPHONE ADVICE RECORD [facility] Patient Name: [name] Gender: Female DOB: 12-Apr-1928 Age: 87 Y 29 D Return Phone Number: [phone] (Primary), [phone] (Secondary) [place] [address] Client [place] [facility] - Client Client Site [facility] - Day Physician Viviana Simpler- MD Contact Type Call Who Is Calling Patient / Member / Family / Caregiver Call Type Triage / Clinical Relationship To Patient Self Return Phone Number [phone] (Secondary) Chief Complaint FAINTING or [place] Reason for Call Symptomatic / Request for [place] states her mother has been extremely dizzy and having inner ear pain. She is so dizzy she feels like she will fall out. Recently had hip surgery on 12/12. Translation No Nurse Assessment Nurse: Cox, RN, [facility] Date/Time (Eastern Time): 08/22/2020 4:17:13 PM Confirm and document reason for call. If symptomatic, describe symptoms. ---Gerrit Heck the patients daughter ,states her mother is dizzy and feels like she will fall over. States she had hip surgery on 12/12. She has inner ear pain. Does the patient have any new or worsening symptoms? ---Yes Will a triage be completed? ---Yes Related visit to physician within the last 2 weeks? ---No Does the PT have any chronic conditions? (i.e. diabetes, asthma, this includes High risk factors for pregnancy, etc.) ---No Is this a behavioral health or substance abuse call? ---No Guidelines Guideline Title Affirmed Question Affirmed Notes Nurse Date/Time (Eastern Time) Earache Walking is very unsteady or feels very dizzy Cox, RN, [facility] 1/76/1607 3:71:06 PM Disp. Time [name] Time) Disposition Final User 08/22/2020 4:15:57 PM Send to Urgent Lorrin Mais 08/22/2020 4:24:42 PM See HCP within 4 Hours (or PCP triage) Yes Cox, RN,  [facility] Caller Disagree/Comply Disagree PLEASE NOTE: All timestamps contained within this report are represented as [place] Standard Time. CONFIDENTIALTY NOTICE: This fax transmission is intended only for the addressee. It contains information that is legally privileged, confidential or otherwise protected from use or disclosure. If you are not the intended recipient, you are strictly prohibited from reviewing, disclosing, copying using or disseminating any of this information or taking any action in reliance on or regarding this information. If you have received this fax in error, please notify us immediately by telephone so that we can arrange for its return to [place]. Phone: [phone], Toll-Free: [phone], Fax: [phone] Page: 2 of 2 Call Id: [id number] [place] Understands Yes PreDisposition [place] Advice Given Per Guideline SEE HCP (OR PCP TRIAGE) WITHIN 4 HOURS: * IF OFFICE WILL BE OPEN: You need to be seen within the next 3 or 4 hours. Call your doctor (or NP/PA) now or as soon as the office opens. CARE ADVICE given per Earache (Adult) guideline. CALL BACK IF: * You become worse Comments User: Cordie Grice, RN Date/Time (Eastern Time): 08/22/2020 4:24:41 PM States she does not want to go to UC, she will keep her appointment on Wednesday Referrals [place] REFUSED

## 2020-08-22 NOTE — Telephone Encounter (Signed)
Aware, I will see her then  

## 2020-08-23 ENCOUNTER — Ambulatory Visit: Payer: PPO | Admitting: Internal Medicine

## 2020-08-23 ENCOUNTER — Encounter: Payer: Self-pay | Admitting: Internal Medicine

## 2020-08-23 NOTE — Telephone Encounter (Signed)
Okay to add her on with me at noon today if she feels she doesn't want to wait for tomorrow

## 2020-08-23 NOTE — Telephone Encounter (Signed)
I spoke to pt and then to [facility]. Made appt at 12pm today with Dr Silvio Pate. Canceled appt with Dr Glori Bickers.

## 2020-08-24 ENCOUNTER — Ambulatory Visit: Payer: PPO | Admitting: Family Medicine

## 2020-08-25 ENCOUNTER — Telehealth: Payer: Self-pay

## 2020-08-25 NOTE — Chronic Care Management (AMB) (Addendum)
Chronic Care Management Pharmacy Assistant   Name: [name]  MRN: [id number] DOB: 12-26-1927  Reason for Encounter: Disease State- General  Patient Question:  1.  Have you seen any other providers or had any change in medication since your last visit? Yes  08/18/20- Dr. Earnestine Leys- Orthopedics 07/11/20- Dr. Earnestine Leys- Orthopedics 06/18/20- ED visit. Hip fracture- Hip surgery 06/07/20- Dr. Brendolyn Patty- Dermatology 05/20/20- Dr. Clyde Canterbury- Otolaryngology 04/25/20- Dr. Sarina Ser- Dermatology 04/18/20- Dr. Remo Lipps Ding[place]dein- Ophthalmology 04/04/20- Dr. Remo Lipps [place]ing[place]dein- Ophthalmology 02/22/20- Dr. Viviana Simpler- PCP    PCP : Venia Carbon, MD  Allergies:   Allergies  Allergen Reactions   Duloxetine     REACTION: N/V Mental status change and trouble with balance   Cymbalta [Duloxetine Hcl]     Unsure of reaction   Maxitrol [Neomycin-Polymyxin-Dexameth]     Unsure of reaction   Pregabalin     REACTION: SOB, swollen lips   Tobradex [Tobramycin-Dexamethasone]     Unsure of reaction    Medications: Outpatient Encounter Medications as of 08/25/2020  Medication Sig   acyclovir (ZOVIRAX) 400 MG tablet TAKE 1 TABLET BY MOUTH 4 TIMES DAILY   amLODipine (NORVASC) 5 MG tablet Take 1 tablet (5 mg total) by mouth daily.   aspirin 81 MG EC tablet Take 1 tablet (81 mg total) by mouth in the morning and at bedtime.   azelastine (ASTELIN) 0.1 % nasal spray Place 2 sprays into both nostrils 2 (two) times daily.   ferrous sulfate 325 (65 FE) MG tablet Take 1 tablet (325 mg total) by mouth daily with breakfast.   fexofenadine (ALLEGRA) 180 MG tablet Take 180 mg by mouth daily.   fluticasone (FLONASE) 50 MCG/ACT nasal spray PLACE 2 SPRAYS INTO EACH NOSTRIL ONCE DAILY (Patient taking differently: Place 2 sprays into both nostrils daily.)   hydrocortisone 2.5 % cream Apply topically 3 (three) times daily as needed.   hydrocortisone 2.5 % cream Apply to face, ears 1-2  times a day as directed.   ketoconazole (NIZORAL) 2 % cream Apply to affected areas face and frontal scalp 1-2 times a day as directed.   ketoconazole (NIZORAL) 2 % shampoo Massage into scalp and behind ears 1-2 times a week, let sit 10 minutes before rinsing.   LORazepam (ATIVAN) 1 MG tablet TAKE 1/2 TO ONE TABLET BY MOUTH TWICE DAILY AS NEEDED FOR ANXIETY   morphine (MSIR) 15 MG tablet TAKE 1 TABLET BY MOUTH EVERY 3 HOURS AS NEEDED FOR PAIN   Multiple Vitamin (MULTIVITAMIN) tablet Take 1 tablet by mouth once daily   NONFORMULARY OR COMPOUNDED ITEM See pharmacy note   pantoprazole (PROTONIX) 40 MG tablet Take 1 tablet (40 mg total) by mouth 2 (two) times daily.   polyethylene glycol (MIRALAX / GLYCOLAX) packet Take 17 g by mouth at bedtime.   RESTASIS 0.05 % ophthalmic emulsion Place 1 drop into both eyes 2 (two) times daily.   topiramate (TOPAMAX) 100 MG tablet TAKE 1 TABLET BY MOUTH TWICE DAILY (Patient taking differently: Take 100 mg by mouth 2 (two) times daily.)   No facility-administered encounter medications on file as of 08/25/2020.    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   Postherpetic neuralgia 12/15/2015   Advance directive discussed with patient 10/27/2015   Allergic rhinitis due to pollen 05/20/2015   Routine general medical examination at a health care facility 02/22/2014   Protein-calorie malnutrition ([place]) 03/24/2012   Hypertension    B12 deficiency 05/03/2009   Neuropathy ([place]) 04/04/2009   Episodic mood disorder ([place]) 06/16/2008   Varicella-zoster infection  11/25/2007   GLAUCOMA NOS 11/14/2006   SLEEP DISORDER 11/14/2006   HYPERLIPIDEMIA 08/28/2006   GERD 08/28/2006   OSTEOARTHRITIS 08/28/2006   BREAST CANCER, HX OF 08/28/2006     Contacted [name] for general disease state call. Since last visit with CPP, no interventions have been made. The patient  has had an ED visit since last contact on 06/18/20 due to left hip fracture. The patient does not currently have any Chronic or PDC medications.  Debbora Dus to review if patient has greater than 5 day gap between last fill. The patient reports the following problems with their health, states inside of both ears are itching. Denies any rash or bumps in either ear. The patient denies  problems with her pharmacy. The patient denies side effects with her medications. she denies  concerns or questions for Debbora Dus, Pharm. D at this time. Asked about her hip after surgery. States hip is doing very well. Denies any complications or residual pain with the hip.   Follow-Up:  Pharmacist Review  Debbora Dus, CPP notified  Margaretmary Dys, [place] Assistant [phone]  I have reviewed the care management and care coordination activities outlined in this encounter and I am certifying that I agree with the content of this note. No further action required.  Debbora Dus, PharmD Clinical Pharmacist [facility] at [facility] [phone]

## 2020-09-05 ENCOUNTER — Ambulatory Visit: Payer: PPO | Admitting: Internal Medicine

## 2020-09-19 ENCOUNTER — Other Ambulatory Visit: Payer: Self-pay | Admitting: Internal Medicine

## 2020-09-19 DIAGNOSIS — H16231 Neurotrophic keratoconjunctivitis, right eye: Secondary | ICD-10-CM | POA: Diagnosis not present

## 2020-09-19 NOTE — Telephone Encounter (Signed)
Name of [place] Name of Pharmacy:[facility] Last [place] or Written Date and Quantity:07-24-20#150 Last Office Visit and Type:02-22-20 Next Office Visit and Type:09-29-20 Last Controlled Substance Agreement Date:04-27-19 Last UDS:04-27-19

## 2020-09-27 ENCOUNTER — Encounter: Payer: Self-pay | Admitting: Dermatology

## 2020-09-29 ENCOUNTER — Other Ambulatory Visit: Payer: Self-pay

## 2020-09-29 ENCOUNTER — Ambulatory Visit (INDEPENDENT_AMBULATORY_CARE_PROVIDER_SITE_OTHER): Payer: PPO | Admitting: Internal Medicine

## 2020-09-29 ENCOUNTER — Encounter: Payer: Self-pay | Admitting: Internal Medicine

## 2020-09-29 DIAGNOSIS — F39 Unspecified mood [affective] disorder: Secondary | ICD-10-CM

## 2020-09-29 DIAGNOSIS — S72145S Nondisplaced intertrochanteric fracture of left femur, sequela: Secondary | ICD-10-CM | POA: Diagnosis not present

## 2020-09-29 DIAGNOSIS — E440 Moderate protein-calorie malnutrition: Secondary | ICD-10-CM

## 2020-09-29 DIAGNOSIS — F1120 Opioid dependence, uncomplicated: Secondary | ICD-10-CM

## 2020-09-29 DIAGNOSIS — G629 Polyneuropathy, unspecified: Secondary | ICD-10-CM

## 2020-09-29 MED ORDER — TOPIRAMATE 50 MG PO TABS: 50.0000 mg | ORAL_TABLET | Freq: Every day | ORAL | 11 refills | Status: DC

## 2020-09-29 MED ORDER — MORPHINE SULFATE 15 MG PO TABS: ORAL_TABLET | ORAL | 0 refills | Status: DC

## 2020-09-29 NOTE — Assessment & Plan Note (Signed)
Severe neurologic pain persists On the topiramate at bedtime---Rx adjusted to what she generally takes (50 at bedtime)

## 2020-09-29 NOTE — Assessment & Plan Note (Signed)
Eating well but not gaining weight Functionally stable again and able to be by herself

## 2020-09-29 NOTE — Assessment & Plan Note (Signed)
No concerns PDMP reviewed

## 2020-09-29 NOTE — Assessment & Plan Note (Signed)
Mostly anxiety with the pain Uses the lorazepam at bedtime

## 2020-09-29 NOTE — Assessment & Plan Note (Signed)
Doing well Support from son and DIL but back to living in her own place Just had walk in shower installed Done with home therapy for some time

## 2020-09-29 NOTE — Progress Notes (Signed)
Subjective:    Patient ID: [name], female    DOB: [birth year], 87 y.o.   MRN: [id number]  HPI Here with daughter in law for follow up after rehab stay due to hip fracture This visit occurred during the SARS-CoV-2 public health emergency.  Safety protocols were in place, including screening questions prior to the visit, additional usage of staff PPE, and extensive cleaning of exam room while observing appropriate contact time as indicated for disinfecting solutions.   Fractured left hip in December Had surgery then to [facility] for rehab Has back home for about 2 months Walks with walker all the time First shower with daughter ----it was redone so she can now walk in DIL does her shopping and they do her cooking, etc Son/DIL do the housework  Still with shingles Uses the morphine prn--usually 3 times a day Uses the lorazepam only at night  Continues on protonix bid No heartburn or dysphagia  Current Outpatient Medications on File Prior to Visit  Medication Sig Dispense Refill  . acyclovir (ZOVIRAX) 400 MG tablet TAKE 1 TABLET BY MOUTH 4 TIMES DAILY 120 tablet 11  . aspirin 81 MG EC tablet Take 1 tablet (81 mg total) by mouth in the morning and at bedtime. 60 tablet 0  . azelastine (ASTELIN) 0.1 % nasal spray Place 2 sprays into both nostrils 2 (two) times daily.    . fexofenadine (ALLEGRA) 180 MG tablet Take 180 mg by mouth daily.    . fluticasone (FLONASE) 50 MCG/ACT nasal spray PLACE 2 SPRAYS INTO EACH NOSTRIL ONCE DAILY (Patient taking differently: Place 2 sprays into both nostrils daily.) 16 g 11  . hydrocortisone 2.5 % cream Apply topically 3 (three) times daily as needed. 28 g 3  . LORazepam (ATIVAN) 1 MG tablet TAKE 1/2 TO ONE TABLET BY MOUTH TWICE DAILY AS NEEDED FOR ANXIETY 60 tablet 0  . morphine (MSIR) 15 MG tablet TAKE 1 TABLET BY MOUTH EVERY 3 HOURS AS NEEDED FOR PAIN 150 tablet 0  . Multiple Vitamin (MULTIVITAMIN) tablet Take 1 tablet by mouth once daily    .  pantoprazole (PROTONIX) 40 MG tablet Take 1 tablet (40 mg total) by mouth 2 (two) times daily. 180 tablet 3  . polyethylene glycol (MIRALAX / GLYCOLAX) packet Take 17 g by mouth at bedtime.    . RESTASIS 0.05 % ophthalmic emulsion Place 1 drop into both eyes 2 (two) times daily.    [name] topiramate (TOPAMAX) 100 MG tablet TAKE 1 TABLET BY MOUTH TWICE DAILY (Patient taking differently: Take 100 mg by mouth 2 (two) times daily.) 60 tablet 11  . amLODipine (NORVASC) 5 MG tablet Take 1 tablet (5 mg total) by mouth daily. (Patient not taking: Reported on 09/29/2020)    . ferrous sulfate 325 (65 FE) MG tablet Take 1 tablet (325 mg total) by mouth daily with breakfast.  3   No current facility-administered medications on file prior to visit.    Allergies  Allergen Reactions  . Duloxetine     REACTION: N/V Mental status change and trouble with balance  . Cymbalta [Duloxetine Hcl]     Unsure of reaction  . Maxitrol [Neomycin-Polymyxin-Dexameth]     Unsure of reaction  . Pregabalin     REACTION: SOB, swollen lips  . Tobradex [Tobramycin-Dexamethasone]     Unsure of reaction    Past Medical History:  Diagnosis Date  . Arrhythmia   . Arthritis   . Atypical chest pain    Lexiscan  myoview (5/11) with EF 84%, normal wall motion, small fixed apical  perfusion defect likely due to breast attenuation, no evidence for ischemia or infarction.  **Patient had an  . Basal cell carcinoma 07/28/19 [facility]   R pretibial  . Breast cancer ([place])    Bilateral mastectomies 1986.  [name] CVA (cerebral infarction) 10/12   right lacunar  . Depression   . Dyspnea    Echo (5/11) was a difficult study due to breast implants but showed normal LV and RV size and systolic function.      [name] GERD (gastroesophageal reflux disease)   . Headache    s/p shingles - right side of head  . History of partial thyroidectomy   . Hyperlipidemia   . Hypertension    in past - no current meds/issues  . Obstruction of intestine ([place])   .  Partial small bowel obstruction ([place]) 7/14   no surgery  . Superficial basal cell carcinoma 06/18/2019   R mid to distal pretibial  . Wears dentures    full upper  . Zoster     with Post-herpetic neuralgia    Past Surgical History:  Procedure Laterality Date  . ABDOMINAL HYSTERECTOMY    . BREAST SURGERY Bilateral    cancer - mastectomy and implant insertion  . INTRAMEDULLARY (IM) NAIL INTERTROCHANTERIC Left 06/19/2020   Procedure: INTRAMEDULLARY (IM) NAIL INTERTROCHANTRIC;  Surgeon: Earnestine Leys, MD;  Location: [facility] ORS;  Service: Orthopedics;  Laterality: Left;  [name] MASTECTOMY  1980   bilateral  . NECK SURGERY    . TARSORRHAPHY Bilateral 08/16/2015   Procedure: MINOR TARSORRAPHY LATERAL PLACEMENT;  Surgeon: Karle Starch, MD;  Location: [facility];  Service: Ophthalmology;  Laterality: Bilateral;  . VAGINAL DELIVERY      Family History  Problem Relation Age of Onset  . Heart disease Son        open heart surgery for a blockage  . Heart Problems Son   . Diabetes Son   . Parkinson's disease Son     Social History   Socioeconomic History  . Marital status: Widowed    Spouse name: Not on file  . Number of children: 2  . Years of education: Not on file  . Highest education level: Not on file  Occupational History  . Occupation: Museum/gallery exhibitions officer: RETIRED  Tobacco Use  . Smoking status: Former Research scientist (life sciences)  . Smokeless tobacco: Never Used  Substance and Sexual Activity  . Alcohol use: No    Alcohol/week: 0.0 standard drinks  . Drug use: No  . Sexual activity: Not Currently  Other Topics Concern  . Not on file  Social History Narrative   No living will   No health care POA but requests daughter-in-law [name] to do this   Would like attempts at resuscitation   No feeding tube if cognitively unaware   Social Determinants of Health   Financial Resource Strain: Not on file  Food Insecurity: Not on file  Transportation Needs: Not on file  Physical  Activity: Not on file  Stress: Not on file  Social Connections: Not on file  Intimate Partner Violence: Not on file   Review of Systems  Did lose weight with the hip fracture---holding now Eating pretty well per DIL though Only takes 1/2 of the topiramate for the chronic PHN     Objective:   Physical Exam Constitutional:      Comments: Cachetic appearing as per her usual  Cardiovascular:  Rate and Rhythm: Normal rate and regular rhythm.     Heart sounds: No murmur heard. No gallop.   Pulmonary:     Effort: Pulmonary effort is normal.     Breath sounds: Normal breath sounds. No wheezing or rales.  Musculoskeletal:     Cervical back: Neck supple.     Right lower leg: No edema.     Left lower leg: No edema.  Lymphadenopathy:     Cervical: No cervical adenopathy.  Neurological:     Mental Status: She is alert.     Comments: Easily stands and walks with walker and then sits            Assessment & Plan:

## 2020-10-03 DIAGNOSIS — S72142A Displaced intertrochanteric fracture of left femur, initial encounter for closed fracture: Secondary | ICD-10-CM | POA: Diagnosis not present

## 2020-10-17 ENCOUNTER — Other Ambulatory Visit: Payer: Self-pay | Admitting: Internal Medicine

## 2020-10-17 ENCOUNTER — Telehealth: Payer: Self-pay

## 2020-10-17 NOTE — Telephone Encounter (Signed)
Spoke to pharmacy. They have the refill. I let [name] know.

## 2020-10-17 NOTE — Telephone Encounter (Signed)
[name] from [facility] stated that their Internet is down and they are needing a verbal order for patients Ativan.  Pharmacy requests refill on: Lorazepam 1 mg   LAST REFILL: 07/24/2020 (Q-60, R-0) LAST OV: 09/29/2020 NEXT OV: 01/02/2021 PHARMACY: [facility]

## 2020-10-17 NOTE — Telephone Encounter (Signed)
Last filled 07-26-20 #60 Last OV 09-29-20 Next OV 01-02-21 [place]

## 2020-10-17 NOTE — Telephone Encounter (Signed)
Refill sent.

## 2020-10-17 NOTE — Telephone Encounter (Signed)
MRS. Elizabeth CALLED AND STATED THAT SHE IS OUT OF THE MEDICATION AND NEEDS RIGHT AWAY

## 2020-10-18 ENCOUNTER — Emergency Department: Payer: PPO

## 2020-10-18 ENCOUNTER — Other Ambulatory Visit: Payer: Self-pay

## 2020-10-18 ENCOUNTER — Emergency Department
Admission: EM | Admit: 2020-10-18 | Discharge: 2020-10-19 | Disposition: A | Discharge status: Home / Self Care | Payer: PPO | Attending: Emergency Medicine | Admitting: Emergency Medicine

## 2020-10-18 DIAGNOSIS — S41112A Laceration without foreign body of left upper arm, initial encounter: Secondary | ICD-10-CM

## 2020-10-18 DIAGNOSIS — Y92003 Bedroom of unspecified non-institutional (private) residence as the place of occurrence of the external cause: Secondary | ICD-10-CM | POA: Insufficient documentation

## 2020-10-18 DIAGNOSIS — Z7982 Long term (current) use of aspirin: Secondary | ICD-10-CM | POA: Diagnosis not present

## 2020-10-18 DIAGNOSIS — R410 Disorientation, unspecified: Secondary | ICD-10-CM | POA: Diagnosis not present

## 2020-10-18 DIAGNOSIS — S199XXA Unspecified injury of neck, initial encounter: Secondary | ICD-10-CM | POA: Diagnosis not present

## 2020-10-18 DIAGNOSIS — Z853 Personal history of malignant neoplasm of breast: Secondary | ICD-10-CM | POA: Diagnosis not present

## 2020-10-18 DIAGNOSIS — R58 Hemorrhage, not elsewhere classified: Secondary | ICD-10-CM | POA: Diagnosis not present

## 2020-10-18 DIAGNOSIS — Z85828 Personal history of other malignant neoplasm of skin: Secondary | ICD-10-CM | POA: Insufficient documentation

## 2020-10-18 DIAGNOSIS — S51012A Laceration without foreign body of left elbow, initial encounter: Secondary | ICD-10-CM | POA: Insufficient documentation

## 2020-10-18 DIAGNOSIS — Z79899 Other long term (current) drug therapy: Secondary | ICD-10-CM | POA: Insufficient documentation

## 2020-10-18 DIAGNOSIS — I2510 Atherosclerotic heart disease of native coronary artery without angina pectoris: Secondary | ICD-10-CM | POA: Insufficient documentation

## 2020-10-18 DIAGNOSIS — Z87891 Personal history of nicotine dependence: Secondary | ICD-10-CM | POA: Diagnosis not present

## 2020-10-18 DIAGNOSIS — W2203XA Walked into furniture, initial encounter: Secondary | ICD-10-CM | POA: Insufficient documentation

## 2020-10-18 DIAGNOSIS — S59902A Unspecified injury of left elbow, initial encounter: Secondary | ICD-10-CM | POA: Diagnosis not present

## 2020-10-18 DIAGNOSIS — I10 Essential (primary) hypertension: Secondary | ICD-10-CM | POA: Insufficient documentation

## 2020-10-18 DIAGNOSIS — R400 Somnolence: Secondary | ICD-10-CM | POA: Diagnosis not present

## 2020-10-18 DIAGNOSIS — M47812 Spondylosis without myelopathy or radiculopathy, cervical region: Secondary | ICD-10-CM | POA: Diagnosis not present

## 2020-10-18 DIAGNOSIS — J984 Other disorders of lung: Secondary | ICD-10-CM | POA: Diagnosis not present

## 2020-10-18 DIAGNOSIS — Z9889 Other specified postprocedural states: Secondary | ICD-10-CM | POA: Diagnosis not present

## 2020-10-18 DIAGNOSIS — M778 Other enthesopathies, not elsewhere classified: Secondary | ICD-10-CM | POA: Diagnosis not present

## 2020-10-18 LAB — CBC WITH DIFFERENTIAL/PLATELET
Abs Immature Granulocytes: 0.01 10*3/uL (ref 0.00–0.07)
Basophils Absolute: 0 10*3/uL (ref 0.0–0.1)
Basophils Relative: 1 %
Eosinophils Absolute: 0 10*3/uL (ref 0.0–0.5)
Eosinophils Relative: 1 %
HCT: 31.8 % — ABNORMAL LOW (ref 36.0–46.0)
Hemoglobin: 10.1 g/dL — ABNORMAL LOW (ref 12.0–15.0)
Immature Granulocytes: 0 %
Lymphocytes Relative: 28 %
Lymphs Abs: 1.4 10*3/uL (ref 0.7–4.0)
MCH: 30.8 pg (ref 26.0–34.0)
MCHC: 31.8 g/dL (ref 30.0–36.0)
MCV: 97 fL (ref 80.0–100.0)
Monocytes Absolute: 0.5 10*3/uL (ref 0.1–1.0)
Monocytes Relative: 10 %
Neutro Abs: 3 10*3/uL (ref 1.7–7.7)
Neutrophils Relative %: 60 %
Platelets: 135 10*3/uL — ABNORMAL LOW (ref 150–400)
RBC: 3.28 MIL/uL — ABNORMAL LOW (ref 3.87–5.11)
RDW: 16.4 % — ABNORMAL HIGH (ref 11.5–15.5)
WBC: 4.9 10*3/uL (ref 4.0–10.5)
nRBC: 0 % (ref 0.0–0.2)

## 2020-10-18 LAB — AMMONIA: Ammonia: 9 umol/L (ref 9–35)

## 2020-10-18 NOTE — ED Triage Notes (Signed)
Pt to ed via ptar from home. Pt states she was reaching over to get something off nightstand and got a skin tear to left elbow. Per PTAR when family got to pts house she was altered. Pt a&o x4.

## 2020-10-18 NOTE — ED Provider Notes (Signed)
[facility] Emergency Department Provider Note  ____________________________________________   Event Date/Time   First MD Initiated Contact with Patient 10/18/20 2302     (approximate)  I have reviewed the triage vital signs and the nursing notes.   HISTORY  Chief Complaint Altered Mental Status   HPI [name] is a 87 y.o. 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  Patient has no complaints and is not sure exactly what she held her elbow on but states she does not fall and has no pain.  She is oriented to month and year.  She states that she was worried someone was coming into her room and think she may have been having a panic attack.  She denies any other hallucinations or other recent sick symptoms including chest pain, cough, fevers, vomiting, diarrhea, dysuria, rash or other recent falls or injuries.  No other acute complaints at this time.  I was able to speak with patient's son who states that she has been doing fairly well has been taking medicines as directed including early aggressive management with morphine daily as needed as well as as needed Ativan for anxiety.  He states that she seemed to be confused or hallucinating thinking it was somewhat in her room trying to get her to seek any medical check on her.  No other acute concerns at this time           Past Medical History:  Diagnosis Date  . Arrhythmia   . Arthritis   . Atypical chest pain    Lexiscan myoview (5/11) with EF 84%, normal wall motion, small fixed apical  perfusion defect likely due to breast attenuation, no  evidence for ischemia or infarction.  **Patient had an  . Basal cell carcinoma 07/28/19 [facility]   R pretibial  . Breast cancer ([place])    Bilateral mastectomies 1986.  [name] CVA (cerebral infarction) 10/12   right lacunar  . Depression   . Dyspnea    Echo (5/11) was a difficult study due to breast implants but showed normal LV and RV size and systolic function.      [name] GERD (gastroesophageal reflux disease)   . Headache    s/p shingles - right side of head  . History of partial thyroidectomy   . Hyperlipidemia   . Hypertension    in past - no current meds/issues  . Obstruction of intestine ([place])   . Partial small bowel obstruction ([place]) 7/14   no surgery  . Superficial basal cell carcinoma 06/18/2019   R mid to distal pretibial  . Wears dentures    full upper  . Zoster     with Post-herpetic neuralgia    Patient Active Problem List   Diagnosis Date Noted  . Protein-calorie malnutrition, severe 06/20/2020  . Intertrochanteric fracture of left femur ([place]) 06/18/2020  . Depression   . Fall at home, initial encounter   . Benzodiazepine dependence ([place]) 04/17/2018  . PAD (peripheral artery disease) ([place]) 07/22/2017  . Narcotic dependence ([place]) 01/03/2017  . Thrombocytopenia ([place]) 09/20/2016  . 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 Postherpetic neuralgia 12/15/2015  .  Advance directive discussed with patient 10/27/2015  . Allergic rhinitis due to pollen 05/20/2015  . Routine general medical examination at a health care facility 02/22/2014  . Protein-calorie malnutrition ([place]) 03/24/2012  . Hypertension   . B12 deficiency 05/03/2009  . Neuropathy ([place]) 04/04/2009  . Episodic mood disorder ([place]) 06/16/2008  . GLAUCOMA NOS 11/14/2006  . SLEEP DISORDER 11/14/2006  . HYPERLIPIDEMIA 08/28/2006  . GERD 08/28/2006  . OSTEOARTHRITIS 08/28/2006  . BREAST CANCER, HX OF 08/28/2006    Past Surgical History:  Procedure Laterality Date  . ABDOMINAL HYSTERECTOMY    . BREAST  SURGERY Bilateral    cancer - mastectomy and implant insertion  . INTRAMEDULLARY (IM) NAIL INTERTROCHANTERIC Left 06/19/2020   Procedure: INTRAMEDULLARY (IM) NAIL INTERTROCHANTRIC;  Surgeon: Earnestine Leys, MD;  Location: [facility] ORS;  Service: Orthopedics;  Laterality: Left;  [name] MASTECTOMY  1980   bilateral  . NECK SURGERY    . TARSORRHAPHY Bilateral 08/16/2015   Procedure: MINOR TARSORRAPHY LATERAL PLACEMENT;  Surgeon: Karle Starch, MD;  Location: [facility];  Service: Ophthalmology;  Laterality: Bilateral;  . VAGINAL DELIVERY      Prior to Admission medications   Medication Sig Start Date End Date Taking? Authorizing Provider  acyclovir (ZOVIRAX) 400 MG tablet TAKE 1 TABLET BY MOUTH 4 TIMES DAILY 07/23/20   Venia Carbon, MD  aspirin 81 MG EC tablet Take 1 tablet (81 mg total) by mouth in the morning and at bedtime. 06/22/20   Shelly Coss, MD  azelastine (ASTELIN) 0.1 % nasal spray Place 2 sprays into both nostrils 2 (two) times daily. 05/20/20   [provider]  fexofenadine (ALLEGRA) 180 MG tablet Take 180 mg by mouth daily.    [provider]  fluticasone (FLONASE) 50 MCG/ACT nasal spray PLACE 2 SPRAYS INTO EACH NOSTRIL ONCE DAILY Patient taking differently: Place 2 sprays into both nostrils daily. 03/05/19   Venia Carbon, MD  hydrocortisone 2.5 % cream Apply topically 3 (three) times daily as needed. 11/16/19   Venia Carbon, MD  LORazepam (ATIVAN) 1 MG tablet TAKE 1/2 TO ONE TABLET BY MOUTH TWICE DAILY AS NEEDED FOR ANXIETY 10/17/20   Viviana Simpler I, MD  morphine (MSIR) 15 MG tablet TAKE 1 TABLET BY MOUTH EVERY 3 HOURS AS NEEDED FOR PAIN 09/29/20   Venia Carbon, MD  Multiple Vitamin (MULTIVITAMIN) tablet Take 1 tablet by mouth once daily    [provider]  pantoprazole (PROTONIX) 40 MG tablet Take 1 tablet (40 mg total) by mouth 2 (two) times daily. 11/16/19   Venia Carbon, MD  polyethylene glycol (MIRALAX / Floria Raveling) packet Take  17 g by mouth at bedtime.    [provider]  RESTASIS 0.05 % ophthalmic emulsion Place 1 drop into both eyes 2 (two) times daily. 02/24/18   [provider]  topiramate (TOPAMAX) 50 MG tablet Take 1 tablet (50 mg total) by mouth at bedtime. 09/29/20   Venia Carbon, MD    Allergies Duloxetine, Cymbalta [duloxetine hcl], Maxitrol [neomycin-polymyxin-dexameth], Pregabalin, and Tobradex [tobramycin-dexamethasone]  Family History  Problem Relation Age of Onset  . Heart disease Son        open heart surgery for a blockage  . Heart Problems Son   . Diabetes Son   . Parkinson's disease Son     Social History Social History   Tobacco Use  . Smoking status: Former Research scientist (life sciences)  . Smokeless tobacco: Never Used  Substance Use Topics  . Alcohol use: No  Alcohol/week: 0.0 standard drinks  . Drug use: No    Review of Systems  Review of Systems  Constitutional: Negative for chills and fever.  HENT: Negative for sore throat.   Eyes: Negative for pain.  Respiratory: Negative for cough and stridor.   Cardiovascular: Negative for chest pain.  Gastrointestinal: Negative for vomiting.  Skin: Negative for rash.  Neurological: Negative for seizures, loss of consciousness and headaches.  Psychiatric/Behavioral: Negative for suicidal ideas.  All other systems reviewed and are negative.     ____________________________________________   PHYSICAL EXAM:  VITAL SIGNS: ED Triage Vitals  Enc Vitals Group     BP      Pulse      Resp      Temp      Temp src      SpO2      Weight      Height      Head Circumference      Peak Flow      Pain Score      Pain Loc      Pain Edu?      Excl. in [place]?    Vitals:   10/19/20 0030 10/19/20 0100  BP: (!) 151/67 (!) 175/81  Pulse: (!) 59 67  Resp: 10 15  Temp:    SpO2: 99% 98%   Physical Exam Vitals and nursing note reviewed.  Constitutional:      General: She is not in acute distress.    Appearance: She is  well-developed.  HENT:     Head: Normocephalic and atraumatic.     Right Ear: External ear normal.     Left Ear: External ear normal.     Nose: Nose normal.  Eyes:     Conjunctiva/sclera: Conjunctivae normal.  Cardiovascular:     Rate and Rhythm: Normal rate and regular rhythm.     Pulses: Normal pulses.     Heart sounds: No murmur heard.   Pulmonary:     Effort: Pulmonary effort is normal. No respiratory distress.     Breath sounds: Normal breath sounds.  Abdominal:     Palpations: Abdomen is soft.     Tenderness: There is no abdominal tenderness.  Musculoskeletal:     Cervical back: Neck supple.     Right lower leg: No edema.     Left lower leg: No edema.  Skin:    General: Skin is warm and dry.     Capillary Refill: Capillary refill takes less than 2 seconds.  Neurological:     Mental Status: She is alert and oriented to person, place, and time.  Psychiatric:        Mood and Affect: Mood normal.     With exception of the right eye cranial nerves II through XII grossly intact.  Patient states the right eye is sutured closed.  Orbit not visualized.  She has symmetric strength in her upper and lower extremities.  2+ bilateral radial and DP pulses.  2 small hemostatic skin tears over the left elbow without obvious other trauma to the extremities face scalp head or neck.  No tenderness step-offs or deformities over the C/T/L-spine.  Patient does not appear to be actively hallucinating or psychotic on assessment.  Oriented to year and month. ____________________________________________   LABS (all labs ordered are listed, but only abnormal results are displayed)  Labs Reviewed  URINALYSIS, COMPLETE (UACMP) WITH MICROSCOPIC - Abnormal; Notable for the following components:      Result Value  Color, Urine STRAW (*)    APPearance CLEAR (*)    Hgb urine dipstick MODERATE (*)    All other components within normal limits  CBC WITH DIFFERENTIAL/PLATELET - Abnormal; Notable for  the following components:   RBC 3.28 (*)    Hemoglobin 10.1 (*)    HCT 31.8 (*)    RDW 16.4 (*)    Platelets 135 (*)    All other components within normal limits  COMPREHENSIVE METABOLIC PANEL - Abnormal; Notable for the following components:   Glucose, Bld 110 (*)    Calcium 8.3 (*)    Total Protein 5.9 (*)    Albumin 3.3 (*)    All other components within normal limits  TSH  AMMONIA   ____________________________________________  EKG  Sinus rhythm with ventricular rate 60, some artifact in aVR and aVL with otherwise unremarkable intervals and no clearance of acute ischemia.    ____________________________________________  RADIOLOGY  ED MD interpretation: Plain film of the left elbow shows no acute fracture dislocation.  CT head and C-spine showed no acute intracranial abnormality acute intracranial injury or acute C-spine injury.  Official radiology report(s): DG Elbow Complete Left  Result Date: 10/18/2020 CLINICAL DATA:  Left elbow pain after injury.  Skin tear. EXAM: LEFT ELBOW - COMPLETE 3+ VIEW COMPARISON:  None. FINDINGS: There is no evidence of fracture, dislocation, or joint effusion. Bone subjectively under mineralized. Normal alignment and joint spaces. Tiny olecranon spur. Soft tissues are unremarkable. IMPRESSION: No acute fracture, dislocation, or joint effusion. Tiny olecranon spur. Electronically Signed   By: Keith Rake M.D.   On: 10/18/2020 23:35   CT Head Wo Contrast  Result Date: 10/18/2020 CLINICAL DATA:  Altered level of consciousness EXAM: CT HEAD WITHOUT CONTRAST TECHNIQUE: Contiguous axial images were obtained from the base of the skull through the vertex without intravenous contrast. COMPARISON:  05/02/2011 FINDINGS: Brain: Chronic encephalomalacia within the right MCA territory consistent with previous infarct. No signs of acute infarct or hemorrhage. Lateral ventricles and midline structures are unremarkable. No acute extra-axial fluid  collections. No mass effect. Vascular: No hyperdense vessel or unexpected calcification. Skull: Normal. Negative for fracture or focal lesion. Sinuses/Orbits: No acute finding. Other: None. IMPRESSION: 1. No acute intracranial process. 2. Chronic encephalomalacia from previous right MCA territory infarct. Electronically Signed   By: Randa Ngo M.D.   On: 10/18/2020 23:57   CT Cervical Spine Wo Contrast  Result Date: 10/18/2020 CLINICAL DATA:  Trauma, altered level of consciousness EXAM: CT CERVICAL SPINE WITHOUT CONTRAST TECHNIQUE: Multidetector CT imaging of the cervical spine was performed without intravenous contrast. Multiplanar CT image reconstructions were also generated. COMPARISON:  None. FINDINGS: Alignment: Alignment is anatomic. Skull base and vertebrae: No acute fracture. No primary bone lesion or focal pathologic process. Soft tissues and spinal canal: No prevertebral fluid or swelling. No visible canal hematoma. Disc levels: Postsurgical changes from previous C5-6 ACDF. Prominent spondylosis at C6-7 with symmetrical neural foraminal encroachment. Mild spondylosis at C2-3 and C3-4, with right predominant neural foraminal encroachment at C2-3. Upper chest: Airway is patent. Biapical pleural and parenchymal scarring. Other: Reconstructed images demonstrate no additional findings. IMPRESSION: 1. No acute cervical spine fracture. 2. C5-6 ACDF. 3. Multilevel spondylosis greatest at C2-3 and C6-7. Electronically Signed   By: Randa Ngo M.D.   On: 10/18/2020 23:59    ____________________________________________   PROCEDURES  Procedure(s) performed (including Critical Care):  .1-3 Lead EKG Interpretation Performed by: Lucrezia Starch, MD Authorized by: Lucrezia Starch, MD  Interpretation: normal     ECG rate assessment: normal     Rhythm: sinus rhythm     Ectopy: none     Conduction: normal       ____________________________________________   INITIAL IMPRESSION /  ASSESSMENT AND PLAN / ED COURSE      Patient presents with above-stated exam for assessment of a cut she sustained to her left elbow after she struck it against a bridge in her bedroom accidentally and some confusion per son.  Patient states she was having a panic attack and does not think she was hallucinating she was worried that someone was coming in her bedroom.  She does not appear to be actively hallucinating on exam has a nonfocal neuro exam without other evidence of significant trauma other than 2 small superficial skin tears over her left elbow.  Skin tears repaired with Steri-Strips.  Plain films of the left elbow are unremarkable for underlying fracture dislocation.  Low suspicion for occult orthopedic or other significant visceral injury.  With regard to reported episode of confusion earlier with patient telling her son she thought there was someone coming into her room she states she remembers this and is not sure if she was having a pain attack or something else.  However she does not appear to be acutely altered delirious in the ED is unclear if this was a brief episode of delirium or not.  CT head is unremarkable for any acute intracranial abnormality and there are no new focal deficits to suggest acute CVA.  C-spine obtained due to possible confusion around injury unremarkable for any C-spine injury.  Urine does not appear infected.  TSH and ammonia unremarkable.  CMP shows no significant electrolyte or metabolic derangements of explain patient's previous mental status.  CBC is unremarkable.  Hemoglobin is at baseline.  On review of records it seems patient is on a fairly high pain regiment for some chronic pain with MSIR morphine as needed and Ativan as needed for anxiety.  Discussed with son that with these medications can predispose to delirium in elderly patients and that they should be reassessed by patient's PCP.  However given she denies any complaint does not appear acutely delirious  in the ED with otherwise reassuring exam work-up and low suspicion for immediate life-threatening pathology I believe she is safe for discharge with significant outpatient evaluation.  Discussed this with son who is in agreement with this plan.  Discharged stable condition.  Strict return precautions advised and discussed.       ____________________________________________   FINAL CLINICAL IMPRESSION(S) / ED DIAGNOSES  Final diagnoses:  Skin tear of left upper arm without complication, initial encounter  Delirium    Medications  lactated ringers bolus 500 mL (500 mLs Intravenous New Bag/Given 10/19/20 0054)     ED Discharge Orders    None       Note:  This document was prepared using Dragon voice recognition software and may include unintentional dictation errors.   Lucrezia Starch, MD 10/19/20 [id number]

## 2020-10-18 NOTE — ED Notes (Signed)
Pt to ct 

## 2020-10-18 NOTE — ED Notes (Signed)
Pt given warm blanket.

## 2020-10-19 ENCOUNTER — Telehealth: Payer: Self-pay | Admitting: Internal Medicine

## 2020-10-19 LAB — URINALYSIS, COMPLETE (UACMP) WITH MICROSCOPIC
Bacteria, UA: NONE SEEN
Bilirubin Urine: NEGATIVE
Glucose, UA: NEGATIVE mg/dL
Ketones, ur: NEGATIVE mg/dL
Leukocytes,Ua: NEGATIVE
Nitrite: NEGATIVE
Protein, ur: NEGATIVE mg/dL
Specific Gravity, Urine: 1.005 (ref 1.005–1.030)
pH: 7 (ref 5.0–8.0)

## 2020-10-19 LAB — COMPREHENSIVE METABOLIC PANEL
ALT: 11 U/L (ref 0–44)
AST: 19 U/L (ref 15–41)
Albumin: 3.3 g/dL — ABNORMAL LOW (ref 3.5–5.0)
Alkaline Phosphatase: 62 U/L (ref 38–126)
Anion gap: 6 (ref 5–15)
BUN: 21 mg/dL (ref 8–23)
CO2: 24 mmol/L (ref 22–32)
Calcium: 8.3 mg/dL — ABNORMAL LOW (ref 8.9–10.3)
Chloride: 107 mmol/L (ref 98–111)
Creatinine, Ser: 0.72 mg/dL (ref 0.44–1.00)
GFR, Estimated: >60 mL/min (ref >60)
Glucose, Bld: 110 mg/dL — ABNORMAL HIGH (ref 70–99)
Potassium: 3.7 mmol/L (ref 3.5–5.1)
Sodium: 137 mmol/L (ref 135–145)
Total Bilirubin: 0.4 mg/dL (ref 0.3–1.2)
Total Protein: 5.9 g/dL — ABNORMAL LOW (ref 6.5–8.1)

## 2020-10-19 LAB — TSH: TSH: 3.101 u[IU]/mL (ref 0.350–4.500)

## 2020-10-19 MED ORDER — LACTATED RINGERS IV BOLUS
500.0000 mL | Freq: Once | INTRAVENOUS | Status: AC
  Administered 2020-10-19: 500 mL via INTRAVENOUS

## 2020-10-19 NOTE — Telephone Encounter (Signed)
That is really good!

## 2020-10-19 NOTE — Telephone Encounter (Signed)
[facility] Night - Client TELEPHONE ADVICE RECORD [facility] Patient Name: [name] [facility] Gender: Female DOB: Nov 19, 1927 Age: 87 Y 2 M 28 D Return Phone Number: [phone] (Primary), [phone] (Secondary) Address: [address] City/ State/ Zip: [place]  [address] Client [facility] Night - Client Client Site [facility] Physician Viviana Simpler- MD Contact Type Call Who Is Calling Patient / Member / Family / Caregiver Call Type Triage / Clinical Caller Name Alleah Dearman Relationship To Patient Son Return Phone Number [phone] (Primary) Chief Complaint Hallucinations Reason for Call Request to Speak to a Physician Initial Comment Caller states his mother. They are on the way to the hospital with her and need Dr. Silvio Pate to see her as soon as he can. She is having hallucinations. Translation No No Triage Reason Other Nurse Assessment Nurse: Sabra Heck, RN, [place] Date/Time (Eastern Time): 10/18/2020 10:50:06 PM Confirm and document reason for call. If symptomatic, describe symptoms. ---Caller states his mother is having hallucinations last night and tonight. mother is currently in ambulance on the way to the hospital. Son is following behind ambulance. Does the patient have any new or worsening symptoms? ---Yes Will a triage be completed? ---No Select reason for no triage. ---Other Please document clinical information provided and list any resource used. ---no triage d/t pt being treated. Disp. Time [name] Time) Disposition Final User 10/18/2020 10:54:21 PM Clinical Call Yes Sabra Heck, RN, [facility]

## 2020-10-19 NOTE — Telephone Encounter (Signed)
I am fine with another provider seeing her if there is an appt available tomorrow or Monday

## 2020-10-19 NOTE — Telephone Encounter (Signed)
Patients daughter in law [name] called in stating that her mother in law was in the ED last night stating that "there was some man breaking in her house and trying to take her clothes off" they are unsure of what is going on with her and said it could be anxiety or panic attacks. They are requesting to be seen asap. They are requesting an afternoon appt. Can you fit them in tomorrow afternoon or this afternoon at some point ? Please advise. EM

## 2020-10-19 NOTE — Telephone Encounter (Signed)
There was an cancellation for tomorrow 10/20/20. I was able to get her in with  Dr Silvio Pate. EM

## 2020-10-19 NOTE — Telephone Encounter (Signed)
Awesome! Thanks

## 2020-10-19 NOTE — Telephone Encounter (Signed)
I really don't have anything today and just overbooked tomorrow as well. I am back on Tuesday ----she can be added on at the end of my morning.  I did review the ER records, etc If she is not having the hallucination/delusions now---just observation is okay

## 2020-10-19 NOTE — Telephone Encounter (Signed)
Spoke to [employer]. She said they have to have an afternoon appt so the end of our day Tuesday will not work. Asking if she could see another provider in a afternoon appt since Dr Silvio Pate does not have anything tomorrow and he is not here Friday or Monday.

## 2020-10-20 ENCOUNTER — Ambulatory Visit: Payer: PPO | Admitting: Internal Medicine

## 2020-10-21 ENCOUNTER — Other Ambulatory Visit: Payer: Self-pay

## 2020-10-21 ENCOUNTER — Encounter: Payer: PPO | Attending: Physician Assistant | Admitting: Physician Assistant

## 2020-10-21 DIAGNOSIS — L89620 Pressure ulcer of left heel, unstageable: Secondary | ICD-10-CM | POA: Diagnosis not present

## 2020-10-21 DIAGNOSIS — I739 Peripheral vascular disease, unspecified: Secondary | ICD-10-CM | POA: Diagnosis not present

## 2020-10-21 DIAGNOSIS — L89623 Pressure ulcer of left heel, stage 3: Secondary | ICD-10-CM | POA: Insufficient documentation

## 2020-10-21 DIAGNOSIS — L98492 Non-pressure chronic ulcer of skin of other sites with fat layer exposed: Secondary | ICD-10-CM | POA: Diagnosis not present

## 2020-10-21 DIAGNOSIS — L89513 Pressure ulcer of right ankle, stage 3: Secondary | ICD-10-CM | POA: Diagnosis not present

## 2020-10-21 DIAGNOSIS — S51802A Unspecified open wound of left forearm, initial encounter: Secondary | ICD-10-CM | POA: Insufficient documentation

## 2020-10-21 DIAGNOSIS — X58XXXA Exposure to other specified factors, initial encounter: Secondary | ICD-10-CM | POA: Insufficient documentation

## 2020-10-21 DIAGNOSIS — L89519 Pressure ulcer of right ankle, unspecified stage: Secondary | ICD-10-CM | POA: Diagnosis present

## 2020-10-21 DIAGNOSIS — I10 Essential (primary) hypertension: Secondary | ICD-10-CM | POA: Diagnosis not present

## 2020-10-21 DIAGNOSIS — I2510 Atherosclerotic heart disease of native coronary artery without angina pectoris: Secondary | ICD-10-CM | POA: Insufficient documentation

## 2020-10-21 NOTE — Progress Notes (Signed)
[name], [name] ([id number]) Visit Report for 10/21/2020 Abuse/Suicide Risk Screen Details Patient Name: [name], [name] Date of Service: 10/21/2020 2:15 PM Medical Record Number: [id number] Patient Account Number: [id number] Date of Birth/Sex: [birth year] (87 y.o. F) Treating RN: Dolan Amen Primary Care Nevae Pinnix: Viviana Simpler Other Clinician: Jeanine Luz Referring Julienne Vogler: Viviana Simpler Treating Alfa Leibensperger/Extender: Skipper Cliche in Treatment: 0 Abuse/Suicide Risk Screen Items Answer ABUSE RISK SCREEN: Has anyone close to you tried to hurt or harm you recentlyo No Do you feel uncomfortable with anyone in your familyo No Has anyone forced you do things that you didnot want to doo No Electronic Signature(s) Signed: 10/21/2020 4:55:18 PM By: Georges Mouse, Minus Breeding RN Entered By: Georges Mouse, Kenia on 10/21/2020 14:39:05 Gencarelli, [name] ([id number]) -------------------------------------------------------------------------------- Activities of Daily Living Details Patient Name: Elizabeth, Downs Date of Service: 10/21/2020 2:15 PM Medical Record Number: [id number] Patient Account Number: [id number] Date of Birth/Sex: [birth year] (87 y.o. F) Treating RN: Dolan Amen Primary Care Lucero Ide: Viviana Simpler Other Clinician: Jeanine Luz Referring Vikas Wegmann: Viviana Simpler Treating Uliana Brinker/Extender: Skipper Cliche in Treatment: 0 Activities of Daily Living Items Answer Activities of Daily Living (Please select one for each item) Drive Automobile Not Able Take Medications Need Assistance Use Telephone Need Assistance Care for Appearance Need Assistance Use Toilet Need Assistance Bath / Shower Completely Able Dress Self Completely Able Feed Self Completely Able Walk Completely Able Get In / Out Bed Completely Able Housework Need Assistance Prepare Meals Need Assistance Handle Money Need Assistance Shop for Self Need Assistance Electronic  Signature(s) Signed: 10/21/2020 4:55:18 PM By: Georges Mouse, Minus Breeding RN Entered By: Georges Mouse, Kenia on 10/21/2020 14:39:47 [name], [name] ([id number]) -------------------------------------------------------------------------------- Education Screening Details Patient Name: [name], [name] Date of Service: 10/21/2020 2:15 PM Medical Record Number: [id number] Patient Account Number: [id number] Date of Birth/Sex: 02/07/1928 (87 y.o. F) Treating RN: Dolan Amen Primary Care Orbin Mayeux: Viviana Simpler Other Clinician: Jeanine Luz Referring Jolonda Gomm: Viviana Simpler Treating Shanel Prazak/Extender: Skipper Cliche in Treatment: 0 Primary Learner Assessed: Patient Learning Preferences/Education Level/Primary Language Learning Preference: Explanation, Demonstration Highest Education Level: High School Preferred Language: English Cognitive Barrier Language Barrier: No Translator Needed: No Memory Deficit: No Emotional Barrier: No Cultural/Religious Beliefs Affecting Medical Care: No Physical Barrier Impaired Vision: Yes Right eye Impaired Hearing: No Decreased Hand dexterity: No Knowledge/Comprehension Knowledge Level: Medium Comprehension Level: Medium Ability to understand written instructions: Medium Ability to understand verbal instructions: Medium Motivation Anxiety Level: Calm Cooperation: Cooperative Education Importance: Acknowledges Need Interest in Health Problems: Asks Questions Perception: Coherent Willingness to Engage in Self-Management High Activities: Readiness to Engage in Self-Management High Activities: Electronic Signature(s) Signed: 10/21/2020 4:55:18 PM By: Georges Mouse, Minus Breeding RN Entered By: Georges Mouse, Kenia on 10/21/2020 14:40:25 [name] ([id number]) -------------------------------------------------------------------------------- Fall Risk Assessment Details Patient Name: [name] Date of Service: 10/21/2020 2:15  PM Medical Record Number: [id number] Patient Account Number: [id number] Date of Birth/Sex: June 26, 1928 (87 y.o. F) Treating RN: Dolan Amen Primary Care Eulia Hatcher: Viviana Simpler Other Clinician: Jeanine Luz Referring Celes Dedic: Viviana Simpler Treating Curran Lenderman/Extender: Skipper Cliche in Treatment: 0 Fall Risk Assessment Items Have you had 2 or more falls in the last 12 monthso 0 No Have you had any fall that resulted in injury in the last 12 monthso 0 Yes FALLS RISK SCREEN History of falling - immediate or within 3 months 0 No Secondary diagnosis (Do you have 2 or more medical diagnoseso) 15 Yes Ambulatory aid None/bed rest/wheelchair/nurse 0 No Crutches/cane/walker 15 Yes Furniture 0 No Intravenous  therapy Access/Saline/Heparin Lock 0 No Gait/Transferring Normal/ bed rest/ wheelchair 0 No Weak (short steps with or without shuffle, stooped but able to lift head while walking, may 10 Yes seek support from furniture) Impaired (short steps with shuffle, may have difficulty arising from chair, head down, impaired 0 No balance) Mental Status Oriented to own ability 0 No Electronic Signature(s) Signed: 10/21/2020 4:55:18 PM By: Georges Mouse, Minus Breeding RN Entered By: Georges Mouse, Kenia on 10/21/2020 14:41:13 Goldner, [name] ([id number]) -------------------------------------------------------------------------------- Foot Assessment Details Patient Name: [name], [name] Date of Service: 10/21/2020 2:15 PM Medical Record Number: [id number] Patient Account Number: [id number] Date of Birth/Sex: Mar 19, 1928 (87 y.o. F) Treating RN: Dolan Amen Primary Care Amita Atayde: Viviana Simpler Other Clinician: Jeanine Luz Referring Orly Quimby: Viviana Simpler Treating Daryan Cagley/Extender: Skipper Cliche in Treatment: 0 Foot Assessment Items Site Locations + = Sensation present, - = Sensation absent, C = Callus, U = Ulcer R = Redness, W = Warmth, M = Maceration, PU =  Pre-ulcerative lesion F = Fissure, S = Swelling, D = Dryness Assessment Right: Left: Other Deformity: No No Prior Foot Ulcer: No No Prior Amputation: No No Charcot Joint: No No Ambulatory Status: Ambulatory With Help Assistance Device: Walker Gait: Administrator, arts) Signed: 10/21/2020 4:55:18 PM By: Georges Mouse, Minus Breeding RN Entered By: Georges Mouse, Kenia on 10/21/2020 14:44:57 Vigen, [name] ([id number]) -------------------------------------------------------------------------------- Nutrition Risk Screening Details Patient Name: [name], [name] Date of Service: 10/21/2020 2:15 PM Medical Record Number: [id number] Patient Account Number: [id number] Date of Birth/Sex: 10-Aug-1927 (87 y.o. F) Treating RN: Dolan Amen Primary Care Waynetta Metheny: Viviana Simpler Other Clinician: Jeanine Luz Referring Nairi Oswald: Viviana Simpler Treating Stephania Macfarlane/Extender: Skipper Cliche in Treatment: 0 Height (in): 66 Weight (lbs): 76 Body Mass Index (BMI): 12.3 Nutrition Risk Screening Items Score Screening NUTRITION RISK SCREEN: I have an illness or condition that made me change the kind and/or amount of food I eat 0 No I eat fewer than two meals per day 3 Yes I eat few fruits and vegetables, or milk products 2 Yes I have three or more drinks of beer, liquor or wine almost every day 0 No I have tooth or mouth problems that make it hard for me to eat 0 No I don't always have enough money to buy the food I need 0 No I eat alone most of the time 0 No I take three or more different prescribed or over-the-counter drugs a day 1 Yes Without wanting to, I have lost or gained 10 pounds in the last six months 2 Yes I am not always physically able to shop, cook and/or feed myself 2 Yes Nutrition Protocols Good Risk Protocol Moderate Risk Protocol High Risk Proctocol 0 Provide education on nutrition Risk Level: High Risk Score: 10 Electronic Signature(s) Signed: 10/21/2020  4:55:18 PM By: Georges Mouse, Minus Breeding RN Entered By: Georges Mouse, Minus Breeding on 10/21/2020 14:41:49

## 2020-10-24 DIAGNOSIS — E11621 Type 2 diabetes mellitus with foot ulcer: Secondary | ICD-10-CM | POA: Diagnosis not present

## 2020-10-24 NOTE — Progress Notes (Signed)
[name], [name] ([id number]) Visit Report for 10/21/2020 Allergy List Details Patient Name: [name], [name]. Date of Service: 10/21/2020 2:15 PM Medical Record Number: [id number] Patient Account Number: [id number] Date of Birth/Sex: Oct 03, 1927 (87 y.o. F) Treating Downs: [name] Primary Care [name]: [name] Other Clinician: Jeanine Downs Referring [name]: [name] Treating [name]/Extender: [name] Weeks in Downs: 0 Allergies Active Allergies duloxetine Maxitrol pregabalin TobraDex Allergy Notes Electronic Signature(s) Signed: 10/21/2020 4:55:18 PM By: [name], [name] Entered By: [name], Kenia on 10/21/2020 14:31:23 [name] ([id number]) -------------------------------------------------------------------------------- Arrival Information Details Patient Name: [name] Date of Service: 10/21/2020 2:15 PM Medical Record Number: [id number] Patient Account Number: [id number] Date of Birth/Sex: 1928-06-10 (87 y.o. F) Treating Downs: [name] Primary Care Thedore Pickel: [name] Other Clinician: Jeanine Downs Referring [name]: [name] Treating Carlon Chaloux/Extender: [name]: 0 Visit Information Patient Arrived: [name] Time: 14:27 Accompanied By: daughter in law Transfer Assistance: None Patient Identification Verified: Yes Secondary Verification Process Yes Completed: Patient Has Alerts: Yes Patient Alerts: NOT DIABETIC LEFT LEG NONCOMPRESSIBLE Electronic Signature(s) Signed: 10/21/2020 3:39:54 PM By: [name] Previous Signature: 10/21/2020 3:24:20 PM Version By: [name] Previous Signature: 10/21/2020 3:24:12 PM Version By: [name], [name] Entered By: [name], Elizabeth Breeding on 10/21/2020 15:39:54 [name] ([id number]) -------------------------------------------------------------------------------- Clinic Level of Care  Assessment Details Patient Name: [name] Date of Service: 10/21/2020 2:15 PM Medical Record Number: [id number] Patient Account Number: [id number] Date of Birth/Sex: Apr 05, 1928 (87 y.o. F) Treating Downs: [name] Primary Care Rea Reser: [name] Other Clinician: Jeanine Downs Referring Charlita Brian: [name] Treating Ozetta Flatley/Extender: [name]: 0 Clinic Level of Care Assessment Items TOOL 1 Quantity Score X - Use when EandM and Procedure is performed on INITIAL visit 1 0 ASSESSMENTS - Nursing Assessment / Reassessment X - General Physical Exam (combine w/ comprehensive assessment (listed just below) when performed on new 1 20 pt. evals) X- 1 25 Comprehensive Assessment (HX, ROS, Risk Assessments, Wounds Hx, etc.) ASSESSMENTS - Wound and Skin Assessment / Reassessment []  - Dermatologic / Skin Assessment (not related to wound area) 0 ASSESSMENTS - Ostomy and/or Continence Assessment and Care []  - Incontinence Assessment and Management 0 []  - 0 Ostomy Care Assessment and Management (repouching, etc.) PROCESS - Coordination of Care X - Simple Patient / Family Education for ongoing care 1 15 []  - 0 Complex (extensive) Patient / Family Education for ongoing care []  - 0 Staff obtains Programmer, systems, Records, Test Results / Process Orders []  - 0 Staff telephones HHA, Nursing Homes / Clarify orders / etc []  - 0 Routine Transfer to another Facility (non-emergent condition) []  - 0 Routine Hospital Admission (non-emergent condition) X- 1 15 New Admissions / Biomedical engineer / Ordering NPWT, Apligraf, etc. []  - 0 Emergency Hospital Admission (emergent condition) PROCESS - Special Needs []  - Pediatric / Minor Patient Management 0 []  - 0 Isolation Patient Management []  - 0 Hearing / Language / Visual special needs []  - 0 Assessment of Community assistance (transportation, D/C planning, etc.) []  - 0 Additional assistance / Altered  mentation []  - 0 Support Surface(s) Assessment (bed, cushion, seat, etc.) INTERVENTIONS - Miscellaneous []  - External ear exam 0 []  - 0 Patient Transfer (multiple staff / Civil Service fast streamer / Similar devices) []  - 0 Simple Staple / Suture removal (25 or less) []  - 0 Complex Staple / Suture removal (26 or more) []  - 0 Hypo/Hyperglycemic Management (do not check if  billed separately) X- 1 15 Ankle / Brachial Index (ABI) - do not check if billed separately Has the patient been seen at the hospital within the last three years: Yes Total Score: 90 Level Of Care: New/Established - Level 3 [name], [name] ([id number]) Electronic Signature(s) Signed: 10/24/2020 7:55:11 AM By: [name] Entered By: [name] on 10/21/2020 [name] ([id number]) -------------------------------------------------------------------------------- Encounter Discharge Information Details Patient Name: [name] Date of Service: 10/21/2020 2:15 PM Medical Record Number: [id number] Patient Account Number: [id number] Date of Birth/Sex: [birth year] (87 y.o. F) Treating Downs: [name] Primary Care [name]: [name] Other Clinician: Jeanine Downs Referring [name]: [name] Treating [name]: [name]: 0 Encounter Discharge Information Items Post Procedure Vitals Discharge Condition: Stable Temperature (F): 97.8 Ambulatory Status: Walker Pulse (bpm): 67 Discharge Destination: Home Respiratory Rate (breaths/min): 18 Transportation: Private Auto Blood Pressure (mmHg): 150/77 Accompanied By: daughter in law Schedule Follow-up Appointment: Yes Clinical Summary of Care: Electronic Signature(s) Signed: 10/24/2020 7:59:51 AM By: [name] Entered By: [name] on 10/21/2020 16:16:57 [name] ([id number]) -------------------------------------------------------------------------------- Lower Extremity Assessment Details Patient Name:  [name], [name] Date of Service: 10/21/2020 2:15 PM Medical Record Number: [id number] Patient Account Number: [id number] Date of Birth/Sex: Jan 02, 1928 (87 y.o. F) Treating Downs: [name] Primary Care Antoria Lanza: [name] Other Clinician: Jeanine Downs Referring Lavetta Geier: [name] Treating Charle Clear/Extender: [name]: 0 Edema Assessment Assessed: Shirlyn Goltz: Yes] Patrice Paradise: Yes] Edema: [Left: Yes] [Right: Yes] Calf Left: Right: Point of Measurement: 30 cm From Medial Instep 20.5 cm 20.5 cm Ankle Left: Right: Point of Measurement: 9 cm From Medial Instep 18 cm 19 cm Knee To Floor Left: Right: From Medial Instep 39 cm 40 cm Vascular Assessment Pulses: Dorsalis Pedis Palpable: [Left:Yes] [Right:Yes] Blood Pressure: Brachial: [Right:122] Dorsalis Pedis: 200 Ankle: Posterior Tibial: 200 Ankle Brachial Index: [Right:1.64] Electronic Signature(s) Signed: 10/21/2020 4:55:18 PM By: [name], [name] Entered By: [name], Kenia on 10/21/2020 15:11:13 Mickelsen, [name] ([id number]) -------------------------------------------------------------------------------- Multi Wound Chart Details Patient Name: [name] Date of Service: 10/21/2020 2:15 PM Medical Record Number: [id number] Patient Account Number: [id number] Date of Birth/Sex: 04-27-1928 (87 y.o. 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 Comorbid History: Arrhythmia, Coronary Artery Arrhythmia, Coronary Artery Arrhythmia, Coronary Artery Disease, Hypertension, Peripheral Disease, Hypertension, Peripheral Disease, Hypertension, Peripheral Arterial Disease, History of pressure Arterial Disease, History of pressure Arterial Disease, History of pressure wounds, Osteoarthritis wounds, Osteoarthritis wounds, Osteoarthritis Date Acquired: 10/22/2014 07/09/2020 10/18/2020 Weeks of Downs: 0 0 0 Wound Status: Open Open Open Measurements L x W x D (cm) 1x1x0.1 1.5x1.3x0.1 2x0.6x0.1 Area (cm) : 0.785 1.532 0.942 Volume (cm) : 0.079 0.153 0.094 % Reduction in Area: 0.00% 0.00% N/A % Reduction in Volume: 0.00% 0.00% N/A Classification: Category/Stage III Unstageable/Unclassified Full Thickness Without Exposed Support Structures Exudate Amount: Medium None Present Medium Exudate Type: Serous N/A Sanguinous Exudate Color: amber N/A red Wound Margin: Flat and Intact N/A N/A Granulation Amount: Small (1-33%) None Present (0%) Large (67-100%) Granulation Quality: Pink N/A Red, Friable Necrotic Amount: Large (67-100%) Large (67-100%) N/A Necrotic Tissue: Adherent  [place] N/A Exposed Structures: Fascia: No Fascia: No Fat Layer (Subcutaneous Tissue): Fat Layer (Subcutaneous Tissue): Fat Layer (Subcutaneous Tissue): Yes No No Fascia: No Tendon: No Tendon: No Tendon: No Muscle: No Muscle: No Muscle: No Joint: No Joint: No Joint: No Bone: No Bone: No Bone: No Epithelialization: None None Small (1-33%) Downs Notes Electronic Signature(s) Signed: 10/24/2020 7:55:11 AM By: [name] Entered By: [name] on 10/21/2020 15:50:32 Poyer, [name] ([id number]) [name], [name] ([id number]) -------------------------------------------------------------------------------- Multi-Disciplinary Care Plan Details Patient Name: MYRLE, WANEK Date of Service: 10/21/2020 2:15 PM Medical  Record Number: [id number] Patient Account Number: [id number] Date of Birth/Sex: 02/08/1928 (87 y.o. F) Treating Downs: [name] Primary Care Cynthea Zachman: [name] Other Clinician: Jeanine Downs Referring Janayia Burggraf: [name] Treating Elianne Gubser/Extender: [name]: 0 Active Inactive Abuse / Safety / Falls / Self Care Management Nursing Diagnoses: Potential for injury related to falls Goals: Patient will not experience any injury related to falls Date Initiated: 10/21/2020 Target Resolution Date: 11/20/2020 Goal Status: Active Interventions: Assess Activities of Daily Living upon admission and as needed Assess fall risk on admission and as needed Assess: immobility, friction, shearing, incontinence upon admission and as needed Assess impairment of mobility on admission and as needed per policy Assess personal safety and home safety (as indicated) on admission and as needed Assess self care needs on admission and as needed Notes: Wound/Skin Impairment Nursing Diagnoses: Impaired tissue integrity Goals: Patient/caregiver will verbalize understanding of skin care regimen Date Initiated: 10/21/2020 Target Resolution Date: 11/20/2020 Goal Status: Active Ulcer/skin breakdown will have a volume reduction of 30% by week 4 Date Initiated: 10/21/2020 Target Resolution Date: 11/20/2020 Goal Status: Active Ulcer/skin breakdown will have a volume reduction of 50% by week 8 Date Initiated: 10/21/2020 Target Resolution Date: 12/21/2020 Goal Status: Active Ulcer/skin breakdown will have a volume reduction of 80% by week 12 Date Initiated: 10/21/2020 Target Resolution Date: 01/20/2021 Goal Status: Active Ulcer/skin breakdown will heal within 14 weeks Date Initiated: 10/21/2020 Target Resolution Date: 02/20/2021 Goal Status: Active Interventions: Assess patient/caregiver ability to obtain necessary supplies Assess patient/caregiver ability to perform ulcer/skin care  regimen upon admission and as needed Assess ulceration(s) every visit Notes: Electronic Signature(s) Signed: 10/24/2020 7:55:11 AM By: [name] Entered By: [name] on 10/21/2020 15:50:17 Buccheri, [name] ([id number]) [name], [name] ([id number]) -------------------------------------------------------------------------------- Pain Assessment Details Patient Name: JESSE, HIRST Date of Service: 10/21/2020 2:15 PM Medical Record Number: [id number] Patient Account Number: [id number] Date of Birth/Sex: Jul 16, 1927 (87 y.o. F) Treating Downs: [name] Primary Care Finn Altemose: [name] Other Clinician: Jeanine Downs Referring Kyshon Tolliver: [name] Treating Rector Devonshire/Extender: [name]: 0 Active Problems Location of Pain Severity and Description of Pain Patient Has Paino Yes Site Locations Pain Location: Pain in Ulcers Rate the pain. Current Pain Level: 10 Character of Pain Describe the Pain: Shooting Pain Management and Medication Current Pain Management: Electronic Signature(s) Signed: 10/21/2020 4:55:18 PM By: [name], [name] Entered By: [name], Kenia on 10/21/2020 14:29:55 [name] ([id number]) -------------------------------------------------------------------------------- Patient/Caregiver Education Details Patient Name: [name], [name] Date of Service: 10/21/2020 2:15 PM Medical Record Number: [id number] Patient Account Number: [id number] Date of Birth/Gender: [birth year] (87 y.o. F) Treating Downs: [name] Primary Care Physician: [name] Other Clinician: Jeanine Downs Referring Physician: Viviana Downs Treating Physician/Extender: [name]: 0 Education Assessment Education Provided To: Patient Education Topics Provided Wound/Skin Impairment: Methods: Explain/Verbal Responses: State content correctly Electronic Signature(s) Signed: 10/24/2020  7:55:11 AM By: [name] Entered By: [name] on 10/21/2020 16:06:26 [name] ([id number]) -------------------------------------------------------------------------------- Wound Assessment Details Patient Name: [name], [name] Date of Service: 10/21/2020 2:15 PM Medical Record Number: [id number] Patient Account Number: [id number] Date of Birth/Sex: 1928/05/13 (87 y.o. F) Treating Downs: [name] Primary Care Breylan Lefevers: [name] Other Clinician: Jeanine Downs Referring Donnel Venuto: [name] Treating Antia Rahal/Extender: [name]: 0 Wound Status Wound Number: 1 Primary Pressure Ulcer Etiology: Wound Location: Right, Lateral Malleolus Wound Open Wounding Event: Gradually Appeared Status: Date Acquired: 10/22/2014 Comorbid Arrhythmia, Coronary Artery Disease, Hypertension, Weeks Of Downs: 0 History: Peripheral Arterial Disease, History of pressure wounds, Clustered Wound: No Osteoarthritis Photos Wound Measurements Length: (cm) 1 Width: (cm) 1 Depth: (cm) 0.1 Area: (cm) 0.785 Volume: (cm) 0.079 % Reduction in Area: 0% % Reduction in Volume: 0% Epithelialization: None Tunneling: No Undermining: No Wound Description Classification: Category/Stage III Wound Margin: Flat and Intact Exudate Amount: Medium Exudate Type: Serous Exudate Color: amber Foul Odor After Cleansing: No Slough/Fibrino Yes Wound Bed Granulation Amount: Small (1-33%) Exposed Structure Granulation Quality: Pink Fascia Exposed: No Necrotic Amount: Large (67-100%) Fat Layer (Subcutaneous Tissue) Exposed: No Necrotic Quality: Adherent Slough Tendon Exposed: No Muscle Exposed: No Joint Exposed: No Bone Exposed: No Downs Notes Wound #1 (Malleolus) Wound Laterality: Right, Lateral Cleanser Normal Saline Discharge Instruction: Wash your hands with soap and water. Remove old dressing, discard into plastic bag and place into trash. Cleanse the wound with Normal  Saline prior to applying a clean dressing using gauze sponges, not tissues or cotton balls. Do not TRINDA, HARLACHER. ([id number]) scrub or use excessive force. Pat dry using gauze sponges, not tissue or cotton balls. Peri-Wound Care Topical Primary Dressing Santyl Collagenase Ointment, 30 (gm), tube Secondary Dressing Coverlet Latex-Free Fabric Adhesive Dressings Discharge Instruction: 1.5 x 2 Gauze Discharge Instruction: over santyl gauze moistened with saline Secured With Compression Wrap Compression Stockings Add-Ons Electronic Signature(s) Signed: 10/21/2020 3:37:29 PM By: [name], [name] Previous Signature: 10/21/2020 3:25:19 PM Version By: [name], [name] Entered By: [name], Kenia on 10/21/2020 15:37:29 Talcott, [name] ([id number]) -------------------------------------------------------------------------------- Wound Assessment Details Patient Name: [name], [name] Date of Service: 10/21/2020 2:15 PM Medical Record Number: [id number] Patient Account Number: [id number] Date of Birth/Sex: [birth year] (87 y.o. F) Treating Downs: [name] Primary Care Davit Vassar: [name] Other Clinician: Jeanine Downs Referring Aynslee Mulhall: [name] Treating Alejandro Gamel/Extender: [name]: 0 Wound Status Wound Number: 2 Primary Pressure Ulcer Etiology: Wound Location: Left Calcaneus Wound Open Wounding Event: Gradually Appeared Status: Date Acquired: 07/09/2020 Comorbid Arrhythmia, Coronary Artery Disease, Hypertension, Weeks Of Downs: 0 History: Peripheral Arterial Disease, History of pressure wounds, Clustered Wound: No Osteoarthritis Photos Wound Measurements Length: (cm) 1.5 Width: (cm) 1.3 Depth: (cm) 0.1 Area: (cm) 1.532 Volume: (cm) 0.153 % Reduction in Area: 0% % Reduction in Volume: 0% Epithelialization: None Tunneling: No Undermining: No Wound Description Classification: Unstageable/Unclassified Exudate  Amount: None Present Foul Odor After Cleansing: No Slough/Fibrino Yes Wound Bed Granulation Amount: None Present (0%) Exposed Structure Necrotic Amount: Large (67-100%) Fascia Exposed: No Necrotic Quality: Eschar, Adherent Slough Fat Layer (Subcutaneous Tissue) Exposed: No Tendon Exposed: No Muscle Exposed: No Joint Exposed: No Bone Exposed: No Downs Notes Wound #2 (Calcaneus) Wound Laterality: Left Cleanser Normal Saline Discharge Instruction: Wash your hands with soap and water. Remove old dressing, discard into plastic bag and place into trash. Cleanse the wound with Normal Saline prior to applying a clean dressing using gauze  sponges, not tissues or cotton balls. Do not scrub or use excessive force. Pat dry using gauze sponges, not tissue or cotton balls. Peri-Wound Care SHATOYA, ROETS ([id number]) Topical Primary Dressing Gauze Discharge Instruction: over santyl gauze moistened with saline Santyl Collagenase Ointment, 30 (gm), tube Secondary Dressing Bordered Gauze Sterile-HBD 4x4 (in/in) Discharge Instruction: Cover wound with Bordered Guaze Sterile as directed Secured With Compression Wrap Compression Stockings Add-Ons Electronic Signature(s) Signed: 10/21/2020 4:55:18 PM By: [name], [name] Entered By: [name], Kenia on 10/21/2020 14:57:35 Edmunds, [name] ([id number]) -------------------------------------------------------------------------------- Wound Assessment Details Patient Name: [name], [name] Date of Service: 10/21/2020 2:15 PM Medical Record Number: [id number] Patient Account Number: [id number] Date of Birth/Sex: 08/14/1927 (87 y.o. F) Treating Downs: [name] Primary Care Lason Eveland: [name] Other Clinician: Jeanine Downs Referring Court Gracia: [name] Treating Kenyanna Grzesiak/Extender: [name]: 0 Wound Status Wound Number: 3 Primary Soft Tissue Radionecrosis Etiology: Wound Location: Left  Forearm Wound Open Wounding Event: Skin Tear/Laceration Status: Date Acquired: 10/18/2020 Comorbid Arrhythmia, Coronary Artery Disease, Hypertension, Weeks Of Downs: 0 History: Peripheral Arterial Disease, History of pressure wounds, Clustered Wound: No Osteoarthritis Photos Wound Measurements Length: (cm) 2 Width: (cm) 0.6 Depth: (cm) 0.1 Area: (cm) 0.942 Volume: (cm) 0.094 % Reduction in Area: % Reduction in Volume: Epithelialization: Small (1-33%) Tunneling: No Undermining: No Wound Description Classification: Full Thickness Without Exposed Support Structu Exudate Amount: Medium Exudate Type: Sanguinous Exudate Color: red res Foul Odor After Cleansing: No Slough/Fibrino No Wound Bed Granulation Amount: Large (67-100%) Exposed Structure Granulation Quality: Red, Friable Fascia Exposed: No Fat Layer (Subcutaneous Tissue) Exposed: Yes Tendon Exposed: No Muscle Exposed: No Joint Exposed: No Bone Exposed: No Electronic Signature(s) Signed: 10/21/2020 4:55:18 PM By: [name], [name] Entered By: [name], Kenia on 10/21/2020 15:15:36 Kugel, [name] ([id number]) -------------------------------------------------------------------------------- Vitals Details Patient Name: [name] Date of Service: 10/21/2020 2:15 PM Medical Record Number: [id number] Patient Account Number: [id number] Date of Birth/Sex: Aug 28, 1927 (87 y.o. F) Treating Downs: [name] Primary Care Willean Schurman: [name] Other Clinician: Jeanine Downs Referring Zelda Reames: [name] Treating Marabelle Cushman/Extender: [name]: 0 Vital Signs Time Taken: 14:29 Temperature (F): 98.1 Height (in): 66 Pulse (bpm): 66 Source: Stated Respiratory Rate (breaths/min): 16 Weight (lbs): 76 Blood Pressure (mmHg): 150/77 Source: Measured Reference Range: 80 - 120 mg / dl Body Mass Index (BMI): 12.3 Electronic Signature(s) Signed: 10/21/2020 4:55:18 PM By:  [name], [name] Entered By: [name], Elizabeth Breeding on 10/21/2020 14:30:50

## 2020-10-24 NOTE — Progress Notes (Signed)
NAJMA, BOZARTH ([id number]) Visit Report for 10/21/2020 Chief Complaint Document Details Patient Name: [name], [name]. Date of Service: 10/21/2020 2:15 PM Medical Record Number: [id number] Patient Account Number: [id number] Date of Birth/Sex: 05/03/1928 (87 y.o. F) Treating RN: Carlene Coria Primary Care Provider: Viviana Simpler Other Clinician: Jeanine Luz Referring Provider: Viviana Simpler Treating Provider/Extender: Skipper Cliche in Treatment: 0 Information Obtained from: Patient Chief Complaint Pressure ulcer right ankle and left heel and left forearm skin tear Electronic Signature(s) Signed: 10/21/2020 3:36:40 PM By: Worthy Keeler PA-C Entered By: Worthy Keeler on 10/21/2020 15:36:40 Dealmeida, [name] ([id number]) -------------------------------------------------------------------------------- Debridement Details Patient Name: [name] Date of Service: 10/21/2020 2:15 PM Medical Record Number: [id number] Patient Account Number: [id number] Date of Birth/Sex: Mar 28, 1928 (87 y.o. F) Treating RN: Carlene Coria Primary Care Provider: Viviana Simpler Other Clinician: Jeanine Luz Referring Provider: Viviana Simpler Treating Provider/Extender: Skipper Cliche in Treatment: 0 Debridement Performed for Wound #3 Left Forearm Assessment: Performed By: Physician Tommie Sams., PA-C Debridement Type: Debridement Level of Consciousness (Pre- Awake and Alert procedure): Pre-procedure Verification/Time Out Yes - 15:45 Taken: Start Time: 15:45 Pain Control: Lidocaine 4% Topical Solution Total Area Debrided (L x W): 2 (cm) x 1 (cm) = 2 (cm) Tissue and other material Viable, Subcutaneous, Skin: Dermis , Skin: Epidermis debrided: Level: Skin/Subcutaneous Tissue Debridement Description: Excisional Instrument: Forceps, Scissors Bleeding: Minimum Hemostasis Achieved: Pressure End Time: 15:51 Procedural Pain: 0 Post Procedural Pain: 0 Response to Treatment:  Procedure was tolerated well Level of Consciousness (Post- Awake and Alert procedure): Post Debridement Measurements of Total Wound Length: (cm) 2 Width: (cm) 1 Depth: (cm) 0.1 Volume: (cm) 0.157 Character of Wound/Ulcer Post Debridement: Improved Post Procedure Diagnosis Same as Pre-procedure Electronic Signature(s) Signed: 10/21/2020 5:20:58 PM By: Worthy Keeler PA-C Signed: 10/24/2020 7:55:11 AM By: Carlene Coria RN Entered By: Carlene Coria on 10/21/2020 15:52:17 [place], [name] ([id number]) -------------------------------------------------------------------------------- Debridement Details Patient Name: [name] Date of Service: 10/21/2020 2:15 PM Medical Record Number: [id number] Patient Account Number: [id number] Date of Birth/Sex: 06-18-1928 (87 y.o. F) Treating RN: Carlene Coria Primary Care Provider: Viviana Simpler Other Clinician: Jeanine Luz Referring Provider: Viviana Simpler Treating Provider/Extender: Skipper Cliche in Treatment: 0 Debridement Performed for Wound #1 Right,Lateral Malleolus Assessment: Performed By: Physician Tommie Sams., PA-C Debridement Type: Debridement Level of Consciousness (Pre- Awake and Alert procedure): Pre-procedure Verification/Time Out Yes - 15:45 Taken: Start Time: 15:45 Pain Control: Lidocaine 4% Topical Solution Total Area Debrided (L x W): 1 (cm) x 1 (cm) = 1 (cm) Tissue and other material Viable, Subcutaneous, Skin: Dermis , Skin: Epidermis debrided: Level: Skin/Subcutaneous Tissue Debridement Description: Excisional Instrument: Forceps, Scissors Bleeding: Minimum Hemostasis Achieved: Pressure End Time: 15:51 Procedural Pain: 0 Post Procedural Pain: 0 Response to Treatment: Procedure was tolerated well Level of Consciousness (Post- Awake and Alert procedure): Post Debridement Measurements of Total Wound Length: (cm) 1 Stage: Category/Stage III Width: (cm) 1 Depth: (cm) 0.1 Volume: (cm)  0.079 Character of Wound/Ulcer Post Debridement: Improved Post Procedure Diagnosis Same as Pre-procedure Electronic Signature(s) Signed: 10/21/2020 5:20:58 PM By: Worthy Keeler PA-C Signed: 10/24/2020 7:55:11 AM By: Carlene Coria RN Entered By: Carlene Coria on 10/21/2020 16:59:33 [place], [name] ([id number]) -------------------------------------------------------------------------------- Debridement Details Patient Name: [name] Date of Service: 10/21/2020 2:15 PM Medical Record Number: [id number] Patient Account Number: [id number] Date of Birth/Sex: Nov 29, 1927 (87 y.o. F) Treating RN: Carlene Coria Primary Care Provider: Viviana Simpler Other Clinician: Jeanine Luz Referring Provider: Viviana Simpler Treating Provider/Extender: Joaquim Lai,  Noriah Osgood Weeks in Treatment: 0 Debridement Performed for Wound #2 Left Calcaneus Assessment: Performed By: Physician Tommie Sams., PA-C Debridement Type: Debridement Level of Consciousness (Pre- Awake and Alert procedure): Pre-procedure Verification/Time Out Yes - 15:45 Taken: Start Time: 15:45 Pain Control: Lidocaine 4% Topical Solution Total Area Debrided (L x W): 1.5 (cm) x 1.3 (cm) = 1.95 (cm) Tissue and other material Viable, Subcutaneous, Skin: Dermis , Skin: Epidermis debrided: Level: Skin/Subcutaneous Tissue Debridement Description: Excisional Instrument: Forceps, Scissors Bleeding: Minimum Hemostasis Achieved: Pressure End Time: 15:51 Procedural Pain: 0 Post Procedural Pain: 0 Response to Treatment: Procedure was tolerated well Level of Consciousness (Post- Awake and Alert procedure): Post Debridement Measurements of Total Wound Length: (cm) 1.5 Stage: Unstageable/Unclassified Width: (cm) 1.3 Depth: (cm) 0.1 Volume: (cm) 0.153 Character of Wound/Ulcer Post Debridement: Improved Post Procedure Diagnosis Same as Pre-procedure Electronic Signature(s) Signed: 10/21/2020 5:20:58 PM By: Worthy Keeler PA-C Signed:  10/24/2020 7:55:11 AM By: Carlene Coria RN Entered By: Carlene Coria on 10/21/2020 17:00:31 S[place], [name] ([id number]) -------------------------------------------------------------------------------- HPI Details Patient Name: [name] Date of Service: 10/21/2020 2:15 PM Medical Record Number: [id number] Patient Account Number: [id number] Date of Birth/Sex: [birth year] (87 y.o. F) Treating RN: Carlene Coria Primary Care Provider: Viviana Simpler Other Clinician: Jeanine Luz Referring Provider: Viviana Simpler Treating Provider/Extender: Skipper Cliche in Treatment: 0 History of Present Illness HPI Description: 10/21/2020 upon evaluation today patient presents for initial inspection here in our clinic concerning issues that she has been having quite some time in regard to her ankle and since she has been in the hospital with regard to the heel. The ankle in fact has been 5-6 years at least I am told. With that being said the heel ulcer occurred when she was in the hospital in December for hip surgery when she fractured her hip. She also was in the hospital Tuesday for altered mental status. Really there was nothing that was identified as the cause for this. She did have a. Fortunately there does not appear to be any signs of infection she tells me that she has had she believes arterial studies At [facility] clinic I could not find Those studies at this point. Nonetheless I will continue to look and see what I can find before Then. The patient also has a skin tear on her arm this occurred more recently when she bumped this at home. The patient does have a history of hypertension, coronary artery disease, and peripheral vascular disease stated.next week when she comes in. Electronic Signature(s) Signed: 10/21/2020 5:16:56 PM By: Worthy Keeler PA-C Entered By: Worthy Keeler on 10/21/2020 17:16:55 Beeghly, [name]  ([id number]) -------------------------------------------------------------------------------- Physical Exam Details Patient Name: ZITLALI, PRIMM Date of Service: 10/21/2020 2:15 PM Medical Record Number: [id number] Patient Account Number: [id number] Date of Birth/Sex: [birth year] (87 y.o. F) Treating RN: Carlene Coria Primary Care Provider: Viviana Simpler Other Clinician: Jeanine Luz Referring Provider: Viviana Simpler Treating Provider/Extender: Skipper Cliche in Treatment: 0 Constitutional sitting or standing blood pressure is within target range for patient.. pulse regular and within target range for patient.[name] respirations regular, non- labored and within target range for patient.[name] temperature within target range for patient.. Eyes conjunctiva clear no eyelid edema noted. pupils equal round and reactive to light and accommodation. Ears, Nose, Mouth, and Throat no gross abnormality of ear auricles or external auditory canals. normal hearing noted during conversation. mucus membranes moist. Respiratory normal breathing without difficulty. Cardiovascular 1+ dorsalis pedis/posterior tibialis pulses. trace pitting edema of the bilateral  lower extremities. Musculoskeletal Patient unable to walk without assistance. Psychiatric this patient is able to make decisions and demonstrates good insight into disease process. Alert and Oriented x 3. pleasant and cooperative. Notes Upon inspection patient did require sharp debridement to remove some of the necrotic tissue where the skin had pulled back and folded on itself in regard to the skin tear. She tolerated the debridement today without complication post debridement wound bed appears to be doing much better which is great news. With that being said she is going require some debridement in regard to the wounds on the ankle as well as the heel. That not knowing her arterial status I am not going to perform a sharp debridement I would  recommend enzymatic debridement with Santyl. Electronic Signature(s) Signed: 10/21/2020 5:18:22 PM By: Worthy Keeler PA-C Entered By: Worthy Keeler on 10/21/2020 17:18:22 [place], [name] ([id number]) -------------------------------------------------------------------------------- Physician Orders Details Patient Name: ALAENA, STRADER Date of Service: 10/21/2020 2:15 PM Medical Record Number: [id number] Patient Account Number: [id number] Date of Birth/Sex: 1928-04-24 (87 y.o. F) Treating RN: Carlene Coria Primary Care Provider: Viviana Simpler Other Clinician: Jeanine Luz Referring Provider: Viviana Simpler Treating Provider/Extender: Skipper Cliche in Treatment: 0 Verbal / Phone Orders: No Diagnosis Coding ICD-10 Coding Code Description L89.513 Pressure ulcer of right ankle, stage 3 L89.623 Pressure ulcer of left heel, stage 3 S51.802A Unspecified open wound of left forearm, initial encounter I10 Essential (primary) hypertension I25.10 Atherosclerotic heart disease of native coronary artery without angina pectoris I73.89 Other specified peripheral vascular diseases Follow-up Appointments o Return Appointment in 1 week. Edema Control - Lymphedema / Segmental Compressive Device / Other o Elevate, Exercise Daily and Avoid Standing for Long Periods of Time. o Elevate legs to the level of the heart and pump ankles as often as possible o Elevate leg(s) parallel to the floor when sitting. Off-Loading o Turn and reposition every 2 hours Wound Treatment Wound #1 - Malleolus Wound Laterality: Right, Lateral Cleanser: Byram Ancillary Kit - 15 Day Supply (DME) (Generic) 1 x Per Day/30 Days Discharge Instructions: Use supplies as instructed; Kit contains: (15) Saline Bullets; (15) 3x3 Gauze; 15 pr Gloves Cleanser: Normal Saline (DME) (Generic) 1 x Per Day/30 Days Discharge Instructions: Wash your hands with soap and water. Remove old dressing, discard into plastic bag and  place into trash. Cleanse the wound with Normal Saline prior to applying a clean dressing using gauze sponges, not tissues or cotton balls. Do not scrub or use excessive force. Pat dry using gauze sponges, not tissue or cotton balls. Primary Dressing: Santyl Collagenase Ointment, 30 (gm), tube (Generic) 1 x Per Day/30 Days Secondary Dressing: Coverlet Latex-Free Fabric Adhesive Dressings (DME) (Generic) 1 x Per Day/30 Days Discharge Instructions: 1.5 x 2 Secondary Dressing: Gauze 1 x Per Day/30 Days Discharge Instructions: over santyl gauze moistened with saline Wound #2 - Calcaneus Wound Laterality: Left Cleanser: Byram Ancillary Kit - 15 Day Supply (DME) (Generic) 1 x Per Day/30 Days Discharge Instructions: Use supplies as instructed; Kit contains: (15) Saline Bullets; (15) 3x3 Gauze; 15 pr Gloves Cleanser: Normal Saline (DME) (Generic) 1 x Per Day/30 Days Discharge Instructions: Wash your hands with soap and water. Remove old dressing, discard into plastic bag and place into trash. Cleanse the wound with Normal Saline prior to applying a clean dressing using gauze sponges, not tissues or cotton balls. Do not scrub or use excessive force. Pat dry using gauze sponges, not tissue or cotton balls. Primary Dressing: Gauze 1 x Per Day/30  Days Discharge Instructions: over santyl gauze moistened with saline Primary Dressing: Santyl Collagenase Ointment, 30 (gm), tube (Generic) 1 x Per Day/30 Days Secondary Dressing: Bordered Gauze Sterile-HBD 4x4 (in/in) (DME) (Generic) 1 x Per Day/30 Days BRIDNEY, GUADARRAMA ([id number]) Discharge Instructions: Cover wound with Bordered Guaze Sterile as directed Wound #3 - Forearm Wound Laterality: Left Cleanser: Byram Ancillary Kit - 15 Day Supply (DME) (Generic) 1 x Per Day/30 Days Discharge Instructions: Use supplies as instructed; Kit contains: (15) Saline Bullets; (15) 3x3 Gauze; 15 pr Gloves Cleanser: Normal Saline (DME) (Generic) 1 x Per Day/30 Days Discharge  Instructions: Wash your hands with soap and water. Remove old dressing, discard into plastic bag and place into trash. Cleanse the wound with Normal Saline prior to applying a clean dressing using gauze sponges, not tissues or cotton balls. Do not scrub or use excessive force. Pat dry using gauze sponges, not tissue or cotton balls. Primary Dressing: Xeroform 4x4-HBD (in/in) (DME) (Generic) 1 x Per Day/30 Days Discharge Instructions: Apply Xeroform 4x4-HBD (in/in) as directed Secondary Dressing: Kerlix 4.5 x 4.1 (in/yd) (DME) (Generic) 1 x Per Day/30 Days Discharge Instructions: Apply Kerlix 4.5 x 4.1 (in/yd) as instructed Secured With: 77M Medipore H Soft Cloth Surgical Tape, 2x2 (in/yd) (DME) (Generic) 1 x Per Day/30 Days Patient Medications Allergies: duloxetine, Maxitrol, pregabalin, TobraDex Notifications Medication Indication Start End Santyl 10/21/2020 DOSE topical 250 unit/gram ointment - ointment topical Apply nickel thick daily to the wound bed and then cover with a dressing as directed in clinic x 30 days Electronic Signature(s) Signed: 10/21/2020 5:20:58 PM By: Worthy Keeler PA-C Signed: 10/24/2020 7:55:11 AM By: Carlene Coria RN Previous Signature: 10/21/2020 4:09:50 PM Version By: Worthy Keeler PA-C Entered By: Carlene Coria on 10/21/2020 17:02:36 [place], [name] ([id number]) -------------------------------------------------------------------------------- Problem List Details Patient Name: KADESHIA, KASPARIAN Date of Service: 10/21/2020 2:15 PM Medical Record Number: [id number] Patient Account Number: [id number] Date of Birth/Sex: February 19, 1928 (87 y.o. F) Treating RN: Carlene Coria Primary Care Provider: Viviana Simpler Other Clinician: Jeanine Luz Referring Provider: Viviana Simpler Treating Provider/Extender: Skipper Cliche in Treatment: 0 Active Problems ICD-10 Encounter Code Description Active Date MDM Diagnosis L89.513 Pressure ulcer of right ankle, stage 3  10/21/2020 No Yes L89.623 Pressure ulcer of left heel, stage 3 10/21/2020 No Yes S51.802A Unspecified open wound of left forearm, initial encounter 10/21/2020 No Yes I10 Essential (primary) hypertension 10/21/2020 No Yes I25.10 Atherosclerotic heart disease of native coronary artery without angina 10/21/2020 No Yes pectoris I73.89 Other specified peripheral vascular diseases 10/21/2020 No Yes Inactive Problems Resolved Problems Electronic Signature(s) Signed: 10/21/2020 3:35:02 PM By: Worthy Keeler PA-C Entered By: Worthy Keeler on 10/21/2020 15:35:02 [place], [name] ([id number]) -------------------------------------------------------------------------------- Progress Note Details Patient Name: [name] Date of Service: 10/21/2020 2:15 PM Medical Record Number: [id number] Patient Account Number: [id number] Date of Birth/Sex: [birth year] (87 y.o. F) Treating RN: Carlene Coria Primary Care Provider: Viviana Simpler Other Clinician: Jeanine Luz Referring Provider: Viviana Simpler Treating Provider/Extender: Skipper Cliche in Treatment: 0 Subjective Chief Complaint Information obtained from Patient Pressure ulcer right ankle and left heel and left forearm skin tear History of Present Illness (HPI) 10/21/2020 upon evaluation today patient presents for initial inspection here in our clinic concerning issues that she has been having quite some time in regard to her ankle and since she has been in the hospital with regard to the heel. The ankle in fact has been 5-6 years at least I am told. With that being said the heel  ulcer occurred when she was in the hospital in December for hip surgery when she fractured her hip. She also was in the hospital Tuesday for altered mental status. Really there was nothing that was identified as the cause for this. She did have a. Fortunately there does not appear to be any signs of infection she tells me that she has had she believes arterial studies  At [facility] clinic I could not find Those studies at this point. Nonetheless I will continue to look and see what I can find before Then. The patient also has a skin tear on her arm this occurred more recently when she bumped this at home. The patient does have a history of hypertension, coronary artery disease, and peripheral vascular disease stated.next week when she comes in. Patient History Information obtained from Patient. Allergies duloxetine, Maxitrol, pregabalin, TobraDex Social History Never smoker, Alcohol Use - Never, Drug Use - No History, Caffeine Use - Never. Medical History Eyes Denies history of Glaucoma Ear/Nose/Mouth/Throat Denies history of Chronic sinus problems/congestion, Middle ear problems Hematologic/Lymphatic Denies history of Anemia, Hemophilia, Human Immunodeficiency Virus, Lymphedema, Sickle Cell Disease Respiratory Denies history of Aspiration, Asthma, Chronic Obstructive Pulmonary Disease (COPD), Pneumothorax, Sleep Apnea, Tuberculosis Cardiovascular Patient has history of Arrhythmia, Coronary Artery Disease, Hypertension, Peripheral Arterial Disease Gastrointestinal Denies history of Cirrhosis , Colitis, Crohn s, Hepatitis A, Hepatitis B, Hepatitis C Endocrine Denies history of Type I Diabetes, Type II Diabetes Genitourinary Denies history of End Stage Renal Disease Immunological Denies history of Lupus Erythematosus, Raynaud s, Scleroderma Integumentary (Skin) Patient has history of History of pressure wounds Denies history of History of Burn Musculoskeletal Patient has history of Osteoarthritis Denies history of Gout, Rheumatoid Arthritis, Osteomyelitis Neurologic Denies history of Dementia, Quadriplegia, Paraplegia, Seizure Disorder Oncologic Denies history of Received Chemotherapy, Received Radiation Psychiatric Denies history of Anorexia/bulimia, Confinement Anxiety Medical And Surgical History  Notes Gastrointestinal GERD Oncologic breast cancer-double mastectomy-1980s Review of Systems (ROS) Aron, [name] ([id number]) Constitutional Symptoms (General Health) Complains or has symptoms of Marked Weight Change. Denies complaints or symptoms of Fatigue, Fever, Chills. Eyes Complains or has symptoms of Vision Changes - right eye. Ear/Nose/Mouth/Throat Denies complaints or symptoms of Difficult clearing ears, Sinusitis. Hematologic/Lymphatic Denies complaints or symptoms of Bleeding / Clotting Disorders, Human Immunodeficiency Virus. Respiratory Denies complaints or symptoms of Chronic or frequent coughs, Shortness of Breath. Cardiovascular Denies complaints or symptoms of Chest pain, LE edema. Gastrointestinal Denies complaints or symptoms of Frequent diarrhea, Nausea, Vomiting. Endocrine Denies complaints or symptoms of Hepatitis, Thyroid disease, Polydypsia (Excessive Thirst). Genitourinary Denies complaints or symptoms of Kidney failure/ Dialysis, Incontinence/dribbling. Immunological Denies complaints or symptoms of Hives, Itching. Integumentary (Skin) Complains or has symptoms of Wounds, Breakdown. Denies complaints or symptoms of Bleeding or bruising tendency, Swelling. Musculoskeletal Denies complaints or symptoms of Muscle Pain, Muscle Weakness. Neurologic Denies complaints or symptoms of Numbness/parasthesias, Focal/Weakness. Psychiatric Complains or has symptoms of Anxiety. Denies complaints or symptoms of Claustrophobia. Objective Constitutional sitting or standing blood pressure is within target range for patient.. pulse regular and within target range for patient.[name] respirations regular, non- labored and within target range for patient.[name] temperature within target range for patient.. Vitals Time Taken: 2:29 PM, Height: 66 in, Source: Stated, Weight: 76 lbs, Source: Measured, BMI: 12.3, Temperature: 98.1 F, Pulse: 66 bpm, Respiratory Rate: 16  breaths/min, Blood Pressure: 150/77 mmHg. Eyes conjunctiva clear no eyelid edema noted. pupils equal round and reactive to light and accommodation. Ears, Nose, Mouth, and Throat no gross abnormality of ear auricles or  external auditory canals. normal hearing noted during conversation. mucus membranes moist. Respiratory normal breathing without difficulty. Cardiovascular 1+ dorsalis pedis/posterior tibialis pulses. trace pitting edema of the bilateral lower extremities. Musculoskeletal Patient unable to walk without assistance. Psychiatric this patient is able to make decisions and demonstrates good insight into disease process. Alert and Oriented x 3. pleasant and cooperative. General Notes: Upon inspection patient did require sharp debridement to remove some of the necrotic tissue where the skin had pulled back and folded on itself in regard to the skin tear. She tolerated the debridement today without complication post debridement wound bed appears to be doing much better which is great news. With that being said she is going require some debridement in regard to the wounds on the ankle as well as the heel. That not knowing her arterial status I am not going to perform a sharp debridement I would recommend enzymatic debridement with Santyl. Integumentary (Hair, Skin) Wound #1 status is Open. Original cause of wound was Gradually Appeared. The date acquired was: 10/22/2014. The wound is located on the Right,Lateral Malleolus. The wound measures 1cm length x 1cm width x 0.1cm depth; 0.785cm^2 area and 0.079cm^3 volume. There is no NADINE, RYLE ([id number]) tunneling or undermining noted. There is a medium amount of serous drainage noted. The wound margin is flat and intact. There is small (1-33%) pink granulation within the wound bed. There is a large (67-100%) amount of necrotic tissue within the wound bed including Adherent Slough. Wound #2 status is Open. Original cause of wound was  Gradually Appeared. The date acquired was: 07/09/2020. The wound is located on the Left Calcaneus. The wound measures 1.5cm length x 1.3cm width x 0.1cm depth; 1.532cm^2 area and 0.153cm^3 volume. There is no tunneling or undermining noted. There is a none present amount of drainage noted. There is no granulation within the wound bed. There is a large (67-100%) amount of necrotic tissue within the wound bed including Eschar and Adherent Slough. Wound #3 status is Open. Original cause of wound was Skin Tear/Laceration. The date acquired was: 10/18/2020. The wound is located on the Left Forearm. The wound measures 2cm length x 0.6cm width x 0.1cm depth; 0.942cm^2 area and 0.094cm^3 volume. There is Fat Layer (Subcutaneous Tissue) exposed. There is no tunneling or undermining noted. There is a medium amount of sanguinous drainage noted. There is large (67-100%) red, friable granulation within the wound bed. Assessment Active Problems ICD-10 Pressure ulcer of right ankle, stage 3 Pressure ulcer of left heel, stage 3 Unspecified open wound of left forearm, initial encounter Essential (primary) hypertension Atherosclerotic heart disease of native coronary artery without angina pectoris Other specified peripheral vascular diseases Procedures Wound #1 Pre-procedure diagnosis of Wound #1 is a Pressure Ulcer located on the Right,Lateral Malleolus . There was a Excisional Skin/Subcutaneous Tissue Debridement with a total area of 1 sq cm performed by Tommie Sams., PA-C. With the following instrument(s): Forceps, and Scissors to remove Viable tissue/material. Material removed includes Subcutaneous Tissue, Skin: Dermis, and Skin: Epidermis after achieving pain control using Lidocaine 4% Topical Solution. No specimens were taken. A time out was conducted at 15:45, prior to the start of the procedure. A Minimum amount of bleeding was controlled with Pressure. The procedure was tolerated well with a pain level  of 0 throughout and a pain level of 0 following the procedure. Post Debridement Measurements: 1cm length x 1cm width x 0.1cm depth; 0.079cm^3 volume. Post debridement Stage noted as Category/Stage III. Character of Wound/Ulcer Post  Debridement is improved. Post procedure Diagnosis Wound #1: Same as Pre-Procedure Wound #2 Pre-procedure diagnosis of Wound #2 is a Pressure Ulcer located on the Left Calcaneus . There was a Excisional Skin/Subcutaneous Tissue Debridement with a total area of 1.95 sq cm performed by Tommie Sams., PA-C. With the following instrument(s): Forceps, and Scissors to remove Viable tissue/material. Material removed includes Subcutaneous Tissue, Skin: Dermis, and Skin: Epidermis after achieving pain control using Lidocaine 4% Topical Solution. No specimens were taken. A time out was conducted at 15:45, prior to the start of the procedure. A Minimum amount of bleeding was controlled with Pressure. The procedure was tolerated well with a pain level of 0 throughout and a pain level of 0 following the procedure. Post Debridement Measurements: 1.5cm length x 1.3cm width x 0.1cm depth; 0.153cm^3 volume. Post debridement Stage noted as Unstageable/Unclassified. Character of Wound/Ulcer Post Debridement is improved. Post procedure Diagnosis Wound #2: Same as Pre-Procedure Wound #3 Pre-procedure diagnosis of Wound #3 is a Skin Tear located on the Left Forearm . There was a Excisional Skin/Subcutaneous Tissue Debridement with a total area of 2 sq cm performed by Tommie Sams., PA-C. With the following instrument(s): Forceps, and Scissors to remove Viable tissue/material. Material removed includes Subcutaneous Tissue, Skin: Dermis, and Skin: Epidermis after achieving pain control using Lidocaine 4% Topical Solution. No specimens were taken. A time out was conducted at 15:45, prior to the start of the procedure. A Minimum amount of bleeding was controlled with Pressure. The procedure  was tolerated well with a pain level of 0 throughout and a pain level of 0 following the procedure. Post Debridement Measurements: 2cm length x 1cm width x 0.1cm depth; 0.157cm^3 volume. Character of Wound/Ulcer Post Debridement is improved. Post procedure Diagnosis Wound #3: Same as Pre-Procedure Plan VICTORA, IRBY ([id number]) Follow-up Appointments: Return Appointment in 1 week. Edema Control - Lymphedema / Segmental Compressive Device / Other: Elevate, Exercise Daily and Avoid Standing for Long Periods of Time. Elevate legs to the level of the heart and pump ankles as often as possible Elevate leg(s) parallel to the floor when sitting. Off-Loading: Turn and reposition every 2 hours The following medication(s) was prescribed: Santyl topical 250 unit/gram ointment ointment topical Apply nickel thick daily to the wound bed and then cover with a dressing as directed in clinic x 30 days starting 10/21/2020 WOUND #1: - Malleolus Wound Laterality: Right, Lateral Cleanser: Byram Ancillary Kit - 15 Day Supply (DME) (Generic) 1 x Per Day/30 Days Discharge Instructions: Use supplies as instructed; Kit contains: (15) Saline Bullets; (15) 3x3 Gauze; 15 pr Gloves Cleanser: Normal Saline (DME) (Generic) 1 x Per Day/30 Days Discharge Instructions: Wash your hands with soap and water. Remove old dressing, discard into plastic bag and place into trash. Cleanse the wound with Normal Saline prior to applying a clean dressing using gauze sponges, not tissues or cotton balls. Do not scrub or use excessive force. Pat dry using gauze sponges, not tissue or cotton balls. Primary Dressing: Santyl Collagenase Ointment, 30 (gm), tube (Generic) 1 x Per Day/30 Days Secondary Dressing: Coverlet Latex-Free Fabric Adhesive Dressings (DME) (Generic) 1 x Per Day/30 Days Discharge Instructions: 1.5 x 2 Secondary Dressing: Gauze 1 x Per Day/30 Days Discharge Instructions: over santyl gauze moistened with saline WOUND  #2: - Calcaneus Wound Laterality: Left Cleanser: Byram Ancillary Kit - 15 Day Supply (DME) (Generic) 1 x Per Day/30 Days Discharge Instructions: Use supplies as instructed; Kit contains: (15) Saline Bullets; (15) 3x3 Gauze; 15 pr Gloves  Cleanser: Normal Saline (DME) (Generic) 1 x Per Day/30 Days Discharge Instructions: Wash your hands with soap and water. Remove old dressing, discard into plastic bag and place into trash. Cleanse the wound with Normal Saline prior to applying a clean dressing using gauze sponges, not tissues or cotton balls. Do not scrub or use excessive force. Pat dry using gauze sponges, not tissue or cotton balls. Primary Dressing: Gauze 1 x Per Day/30 Days Discharge Instructions: over santyl gauze moistened with saline Primary Dressing: Santyl Collagenase Ointment, 30 (gm), tube (Generic) 1 x Per Day/30 Days Secondary Dressing: Bordered Gauze Sterile-HBD 4x4 (in/in) (DME) (Generic) 1 x Per Day/30 Days Discharge Instructions: Cover wound with Bordered Guaze Sterile as directed WOUND #3: - Forearm Wound Laterality: Left Cleanser: Byram Ancillary Kit - 15 Day Supply (DME) (Generic) 1 x Per Day/30 Days Discharge Instructions: Use supplies as instructed; Kit contains: (15) Saline Bullets; (15) 3x3 Gauze; 15 pr Gloves Cleanser: Normal Saline (DME) (Generic) 1 x Per Day/30 Days Discharge Instructions: Wash your hands with soap and water. Remove old dressing, discard into plastic bag and place into trash. Cleanse the wound with Normal Saline prior to applying a clean dressing using gauze sponges, not tissues or cotton balls. Do not scrub or use excessive force. Pat dry using gauze sponges, not tissue or cotton balls. Primary Dressing: Xeroform 4x4-HBD (in/in) (DME) (Generic) 1 x Per Day/30 Days Discharge Instructions: Apply Xeroform 4x4-HBD (in/in) as directed Secondary Dressing: Kerlix 4.5 x 4.1 (in/yd) (DME) (Generic) 1 x Per Day/30 Days Discharge Instructions: Apply Kerlix 4.5  x 4.1 (in/yd) as instructed Secured With: 50M Medipore H Soft Cloth Surgical Tape, 2x2 (in/yd) (DME) (Generic) 1 x Per Day/30 Days 1. Would recommend currently that we go ahead and initiate treatment with Kerlix to secure after applying ABD pad were gauze underneath this region. 2. With regard to the heel and ankle region I Minna send in a prescription for Santyl and that was sent to the pharmacy today. 3. I am also can recommend that we have the patient cover with a Telfa island dressing over these regions along with saline moistened gauze. We will see patient back for reevaluation in 1 week here in the clinic. If anything worsens or changes patient will contact our office for additional recommendations. We will see patient back for reevaluation in 1 week here in the clinic. If anything worsens or changes patient will contact our office for additional recommendations. Electronic Signature(s) Signed: 10/21/2020 5:20:13 PM By: Worthy Keeler PA-C Entered By: Worthy Keeler on 10/21/2020 17:20:13 Hollomon, [name] ([id number]) -------------------------------------------------------------------------------- ROS/PFSH Details Patient Name: SHANTEE, HAYNE Date of Service: 10/21/2020 2:15 PM Medical Record Number: [id number] Patient Account Number: [id number] Date of Birth/Sex: 1928-04-16 (87 y.o. F) Treating RN: Dolan Amen Primary Care Provider: Viviana Simpler Other Clinician: Jeanine Luz Referring Provider: Viviana Simpler Treating Provider/Extender: Skipper Cliche in Treatment: 0 Information Obtained From Patient Constitutional Symptoms (General Health) Complaints and Symptoms: Positive for: Marked Weight Change Negative for: Fatigue; Fever; Chills Eyes Complaints and Symptoms: Positive for: Vision Changes - right eye Medical History: Negative for: Glaucoma Ear/Nose/Mouth/Throat Complaints and Symptoms: Negative for: Difficult clearing ears; Sinusitis Medical  History: Negative for: Chronic sinus problems/congestion; Middle ear problems Hematologic/Lymphatic Complaints and Symptoms: Negative for: Bleeding / Clotting Disorders; Human Immunodeficiency Virus Medical History: Negative for: Anemia; Hemophilia; Human Immunodeficiency Virus; Lymphedema; Sickle Cell Disease Respiratory Complaints and Symptoms: Negative for: Chronic or frequent coughs; Shortness of Breath Medical History: Negative for: Aspiration; Asthma; Chronic Obstructive  Pulmonary Disease (COPD); Pneumothorax; Sleep Apnea; Tuberculosis Cardiovascular Complaints and Symptoms: Negative for: Chest pain; LE edema Medical History: Positive for: Arrhythmia; Coronary Artery Disease; Hypertension; Peripheral Arterial Disease Gastrointestinal Complaints and Symptoms: Negative for: Frequent diarrhea; Nausea; Vomiting Medical History: Negative for: Cirrhosis ; Colitis; Crohnos; Hepatitis A; Hepatitis B; Hepatitis C Past Medical History Notes: GERD Endocrine LEXANI, CORONA ([id number]) Complaints and Symptoms: Negative for: Hepatitis; Thyroid disease; Polydypsia (Excessive Thirst) Medical History: Negative for: Type I Diabetes; Type II Diabetes Genitourinary Complaints and Symptoms: Negative for: Kidney failure/ Dialysis; Incontinence/dribbling Medical History: Negative for: End Stage Renal Disease Immunological Complaints and Symptoms: Negative for: Hives; Itching Medical History: Negative for: Lupus Erythematosus; Raynaudos; Scleroderma Integumentary (Skin) Complaints and Symptoms: Positive for: Wounds; Breakdown Negative for: Bleeding or bruising tendency; Swelling Medical History: Positive for: History of pressure wounds Negative for: History of Burn Musculoskeletal Complaints and Symptoms: Negative for: Muscle Pain; Muscle Weakness Medical History: Positive for: Osteoarthritis Negative for: Gout; Rheumatoid Arthritis; Osteomyelitis Neurologic Complaints and  Symptoms: Negative for: Numbness/parasthesias; Focal/Weakness Medical History: Negative for: Dementia; Quadriplegia; Paraplegia; Seizure Disorder Psychiatric Complaints and Symptoms: Positive for: Anxiety Negative for: Claustrophobia Medical History: Negative for: Anorexia/bulimia; Confinement Anxiety Oncologic Medical History: Negative for: Received Chemotherapy; Received Radiation Past Medical History Notes: breast cancer-double mastectomy-1980s Immunizations Pneumococcal Vaccine: Received Pneumococcal Vaccination: Yes Implantable Devices JANECE, LAIDLAW ([id number]) None Family and Social History Never smoker; Alcohol Use: Never; Drug Use: No History; Caffeine Use: Never Electronic Signature(s) Signed: 10/21/2020 4:55:18 PM By: Georges Mouse, Minus Breeding RN Signed: 10/21/2020 5:20:58 PM By: Worthy Keeler PA-C Entered By: Georges Mouse, Kenia on 10/21/2020 14:38:31 Schnebly, [name] ([id number]) -------------------------------------------------------------------------------- SuperBill Details Patient Name: BERNARDETTE, WALDRON Date of Service: 10/21/2020 Medical Record Number: [id number] Patient Account Number: [id number] Date of Birth/Sex: 1927-10-09 (87 y.o. F) Treating RN: Carlene Coria Primary Care Provider: Viviana Simpler Other Clinician: Jeanine Luz Referring Provider: Viviana Simpler Treating Provider/Extender: Skipper Cliche in Treatment: 0 Diagnosis Coding ICD-10 Codes Code Description [phone] Pressure ulcer of right ankle, stage 3 L89.623 Pressure ulcer of left heel, stage 3 S51.802A Unspecified open wound of left forearm, initial encounter [place] (primary) hypertension I25.10 Atherosclerotic heart disease of native coronary artery without angina pectoris I73.89 Other specified peripheral vascular diseases Facility Procedures CPT4 Code: 33295188 Description: [place] VISIT-LEV 3 EST PT Modifier: Quantity: 1 CPT4 Code:  41660630 Description: 16010 - DEB SUBQ TISSUE 20 SQ CM/< Modifier: Quantity: 1 CPT4 Code: Description: ICD-10 Diagnosis Description S51.802A Unspecified open wound of left forearm, initial encounter Modifier: Quantity: Physician Procedures CPT4 Code: 9323557 Description: 32202 - WC PHYS LEVEL 4 - NEW PT Modifier: 25 Quantity: 1 CPT4 Code: Description: ICD-10 Diagnosis Description L89.513 Pressure ulcer of right ankle, stage 3 L89.623 Pressure ulcer of left heel, stage 3 S51.802A Unspecified open wound of left forearm, initial encounter I10 Essential (primary) hypertension Modifier: Quantity: CPT4 Code: [id number] Description: 11042 - WC PHYS SUBQ TISS 20 SQ CM Modifier: Quantity: 1 CPT4 Code: Description: ICD-10 Diagnosis Description S51.802A Unspecified open wound of left forearm, initial encounter Modifier: Quantity: Electronic Signature(s) Signed: 10/21/2020 5:20:39 PM By: Worthy Keeler PA-C Entered By: Worthy Keeler on 10/21/2020 17:20:39

## 2020-10-25 DIAGNOSIS — H16231 Neurotrophic keratoconjunctivitis, right eye: Secondary | ICD-10-CM | POA: Diagnosis not present

## 2020-10-26 ENCOUNTER — Telehealth: Payer: Self-pay

## 2020-10-26 NOTE — Chronic Care Management (AMB) (Addendum)
Chronic Care Management Pharmacy Assistant   Name: [name]  MRN: [id number] DOB: 04-Apr-1928  Reason for Encounter: Disease State- General   Conditions to be addressed/monitored: HTN, HLD and Neuropathy  Recent office visits:  09/29/20- Dr. Silvio Pate PCP- Decreased topiramate from 50 mg twice daily to 50 mg at bedtime. Patient reported not taking amlodipine or using Ketoconazole cream or shampoo. Ferrous sulfate no longer needed. Follow up in 3 months.   Recent consult visits:  10/21/20- Jeri Cos, PA-C - Wound care- Right, Lateral Malleolus Left Calcaneus Left Forearm 10/03/20- Dr. Earnestine Leys- Orthopedics- No data available  Hospital visits:  10/18/20 - ED visit - due to Altered mental status and skin tear on left upper arm   Medications: Outpatient Encounter Medications as of 10/26/2020  Medication Sig   acyclovir (ZOVIRAX) 400 MG tablet TAKE 1 TABLET BY MOUTH 4 TIMES DAILY   aspirin 81 MG EC tablet Take 1 tablet (81 mg total) by mouth in the morning and at bedtime.   azelastine (ASTELIN) 0.1 % nasal spray Place 2 sprays into both nostrils 2 (two) times daily.   fexofenadine (ALLEGRA) 180 MG tablet Take 180 mg by mouth daily.   fluticasone (FLONASE) 50 MCG/ACT nasal spray PLACE 2 SPRAYS INTO EACH NOSTRIL ONCE DAILY (Patient taking differently: Place 2 sprays into both nostrils daily.)   hydrocortisone 2.5 % cream Apply topically 3 (three) times daily as needed.   LORazepam (ATIVAN) 1 MG tablet TAKE 1/2 TO ONE TABLET BY MOUTH TWICE DAILY AS NEEDED FOR ANXIETY   morphine (MSIR) 15 MG tablet TAKE 1 TABLET BY MOUTH EVERY 3 HOURS AS NEEDED FOR PAIN   Multiple Vitamin (MULTIVITAMIN) tablet Take 1 tablet by mouth once daily   pantoprazole (PROTONIX) 40 MG tablet Take 1 tablet (40 mg total) by mouth 2 (two) times daily.   polyethylene glycol (MIRALAX / GLYCOLAX) packet Take 17 g by mouth at bedtime.   RESTASIS 0.05 % ophthalmic emulsion Place 1 drop into both eyes 2 (two) times  daily.   topiramate (TOPAMAX) 50 MG tablet Take 1 tablet (50 mg total) by mouth at bedtime.   No facility-administered encounter medications on file as of 10/26/2020.     Contacted patient for general adherence call. Since last visit with CPP, the following interventions have been made: 09/29/20- Dr. Silvio Pate PCP- Decreased topiramate from 50 mg twice daily to 50 mg at bedtime. Patient reported not taking amlodipine or using Ketoconazole cream or shampoo. Ferrous sulfate no longer needed.  The patient has had an ED visit since their last CPP follow up. ED visit 10/18/20 due to altered mental status and skin tear on left upper arm. Discharge date was 10/19/20. Discharged from [facility]. The patient is not currently on Chronic and PDC medications. The patient has had the following problems with their health: Fall at home, causing skin tear.  Patient denies any problems with pharmacy. The patient has not had any side effects with their medicines. The patient has no recommendations for improvements in managing care. Patient states she is getting some help with transportation to appointments. She does not know specifics on this but states daughter does. She does not check blood pressure at home and states that she thinks it was good at her last office visit. She states she has been going to wound care for wounds on her heels after being at a nursing facility. Patient does not have anything she would like to discuss with pharmacist today.  Follow-Up:  Pharmacist Review  Debbora Dus, CPP notified  Margaretmary Dys, [place] Assistant [phone]  I have reviewed the care management and care coordination activities outlined in this encounter and I am certifying that I agree with the content of this note. No further action required.  Debbora Dus, PharmD Clinical Pharmacist [facility] [phone]

## 2020-10-28 ENCOUNTER — Encounter: Payer: PPO | Admitting: Physician Assistant

## 2020-10-28 ENCOUNTER — Other Ambulatory Visit: Payer: Self-pay

## 2020-10-28 DIAGNOSIS — L89623 Pressure ulcer of left heel, stage 3: Secondary | ICD-10-CM | POA: Diagnosis not present

## 2020-10-28 DIAGNOSIS — L98492 Non-pressure chronic ulcer of skin of other sites with fat layer exposed: Secondary | ICD-10-CM | POA: Diagnosis not present

## 2020-10-28 DIAGNOSIS — L89513 Pressure ulcer of right ankle, stage 3: Secondary | ICD-10-CM | POA: Diagnosis not present

## 2020-10-28 NOTE — Progress Notes (Addendum)
Elizabeth, Downs ([id number]) Visit Report for 10/28/2020 Chief Complaint Document Details Patient Name: [name], [name]. Date of Service: 10/28/2020 2:45 PM Medical Record Number: [id number] Patient Account Number: [id number] Date of Birth/Sex: 09/29/1927 (87 y.o. F) Treating RN: Carlene Coria Primary Care Provider: Viviana Simpler Other Clinician: Referring Provider: Viviana Simpler Treating Provider/Extender: Skipper Cliche in Treatment: 1 Information Obtained from: Patient Chief Complaint Pressure ulcer right ankle and left heel and left forearm skin tear Electronic Signature(s) Signed: 10/28/2020 2:44:31 PM By: Worthy Keeler PA-C Entered By: Worthy Keeler on 10/28/2020 14:44:31 Mcneil, [name] ([id number]) -------------------------------------------------------------------------------- HPI Details Patient Name: Elizabeth, Downs Date of Service: 10/28/2020 2:45 PM Medical Record Number: [id number] Patient Account Number: [id number] Date of Birth/Sex: [birth year] (87 y.o. F) Treating RN: Carlene Coria Primary Care Provider: Viviana Simpler Other Clinician: Referring Provider: Viviana Simpler Treating Provider/Extender: Skipper Cliche in Treatment: 1 History of Present Illness HPI Description: 10/21/2020 upon evaluation today patient presents for initial inspection here in our clinic concerning issues that she has been having quite some time in regard to her ankle and since she has been in the hospital with regard to the heel. The ankle in fact has been 5-6 years at least I am told. With that being said the heel ulcer occurred when she was in the hospital in December for hip surgery when she fractured her hip. She also was in the hospital Tuesday for altered mental status. Really there was nothing that was identified as the cause for this. She did have a. Fortunately there does not appear to be any signs of infection she tells me that she has had she believes arterial studies At  [facility] clinic I could not find Those studies at this point. Nonetheless I will continue to look and see what I can find before Then. The patient also has a skin tear on her arm this occurred more recently when she bumped this at home. The patient does have a history of hypertension, coronary artery disease, and peripheral vascular disease stated. 10/28/2020 I was able to find the patient's chart currently which shows that she did have an arterial study performed in May 2019. This showed that she had a abnormal right toe brachial index and a normal left toe brachial index. She was noncompressible as far as ABIs were concerned. She did appear to have triphasic flow at that time. Unfortunately the wound that is commented on in the report that I printed off and read mentions the same wound on the ankle that we are still dealing with at this point. Unfortunately this has not healed. And its been quite sometime about 3 years now. Fortunately there does not appear to be any signs of active infection systemically at this point. I think that the patient has done well with the Santyl over the past week which is good news. Patient's caregiver which is her daughter-in-law is concerned about the fact that she really does not feel qualified to be able to change the dressings and take care of this issue. There does not appear to be any signs of anything untoward going on at this point. I think she is done a great job applying the [employer] and I think that has done a great job for the patient is for soften up some of the necrotic tissue. With that being said I think the Xeroform on the arm also has done excellent. In general I am very pleased with where we stand. And I told the patient's  daughter-in- law as well that I also feel like she has done a great job over the past week taking care of her mother. Electronic Signature(s) Signed: 10/28/2020 3:36:09 PM By: Worthy Keeler PA-C Entered By: Worthy Keeler on  10/28/2020 15:36:09 Krahn, [name] ([id number]) -------------------------------------------------------------------------------- Physical Exam Details Patient Name: Elizabeth, Downs Date of Service: 10/28/2020 2:45 PM Medical Record Number: [id number] Patient Account Number: [id number] Date of Birth/Sex: 1927-08-26 (87 y.o. F) Treating RN: Carlene Coria Primary Care Provider: Viviana Simpler Other Clinician: Referring Provider: Viviana Simpler Treating Provider/Extender: Skipper Cliche in Treatment: 1 Constitutional Well-nourished and well-hydrated in no acute distress. Respiratory normal breathing without difficulty. Psychiatric this patient is able to make decisions and demonstrates good insight into disease process. Alert and Oriented x 3. pleasant and cooperative. Notes Upon inspection patient's wound bed actually showed signs of good granulation and epithelization in regard to the right ankle as well as the left forearm region. I think both of these areas are doing quite well. In regard to the left heel this is showing signs of softening as far as the eschar is concerned but again still not cleaning up quite as well as I would like to see. I am avoiding sharp debridement at this point until we confirm that she has sufficient blood flow especially considering how long these wounds have been present. Electronic Signature(s) Signed: 10/28/2020 3:36:48 PM By: Worthy Keeler PA-C Entered By: Worthy Keeler on 10/28/2020 15:36:48 Burkman, [name] ([id number]) -------------------------------------------------------------------------------- Physician Orders Details Patient Name: Elizabeth, Downs Date of Service: 10/28/2020 2:45 PM Medical Record Number: [id number] Patient Account Number: [id number] Date of Birth/Sex: 06-19-1928 (87 y.o. F) Treating RN: Carlene Coria Primary Care Provider: Viviana Simpler Other Clinician: Referring Provider: Viviana Simpler Treating Provider/Extender:  Skipper Cliche in Treatment: 1 Verbal / Phone Orders: No Diagnosis Coding ICD-10 Coding Code Description L89.513 Pressure ulcer of right ankle, stage 3 L89.623 Pressure ulcer of left heel, stage 3 S51.802A Unspecified open wound of left forearm, initial encounter I10 Essential (primary) hypertension I25.10 Atherosclerotic heart disease of native coronary artery without angina pectoris I73.89 Other specified peripheral vascular diseases Follow-up Appointments o Return Appointment in 1 week. Home Health o ADMIT to Home Health for wound care. May utilize formulary equivalent dressing for wound treatment orders unless otherwise specified. Home Health Nurse may visit PRN to address patientos wound care needs. Edema Control - Lymphedema / Segmental Compressive Device / Other o Elevate, Exercise Daily and Avoid Standing for Long Periods of Time. o Elevate legs to the level of the heart and pump ankles as often as possible o Elevate leg(s) parallel to the floor when sitting. Non-Wound Condition o Additional non-wound orders/instructions: - 1 st met head right foot foam and coverlet Off-Loading o Turn and reposition every 2 hours Wound Treatment Wound #1 - Malleolus Wound Laterality: Right, Lateral Cleanser: Byram Ancillary Kit - 15 Day Supply (Generic) 1 x Per Day/30 Days Discharge Instructions: Use supplies as instructed; Kit contains: (15) Saline Bullets; (15) 3x3 Gauze; 15 pr Gloves Cleanser: Normal Saline (Generic) 1 x Per Day/30 Days Discharge Instructions: Wash your hands with soap and water. Remove old dressing, discard into plastic bag and place into trash. Cleanse the wound with Normal Saline prior to applying a clean dressing using gauze sponges, not tissues or cotton balls. Do not scrub or use excessive force. Pat dry using gauze sponges, not tissue or cotton balls. Primary Dressing: Santyl Collagenase Ointment, 30 (gm), tube (Generic) 1 x Per  Day/30  Days Secondary Dressing: Coverlet Latex-Free Fabric Adhesive Dressings (Generic) 1 x Per Day/30 Days Discharge Instructions: 1.5 x 2 Secondary Dressing: Gauze 1 x Per Day/30 Days Discharge Instructions: over santyl gauze moistened with saline Wound #2 - Calcaneus Wound Laterality: Left Cleanser: Byram Ancillary Kit - 15 Day Supply (Generic) 1 x Per Day/30 Days Discharge Instructions: Use supplies as instructed; Kit contains: (15) Saline Bullets; (15) 3x3 Gauze; 15 pr Gloves Cleanser: Normal Saline (Generic) 1 x Per Day/30 Days CAMESHIA, CRESSMAN ([id number]) Discharge Instructions: Wash your hands with soap and water. Remove old dressing, discard into plastic bag and place into trash. Cleanse the wound with Normal Saline prior to applying a clean dressing using gauze sponges, not tissues or cotton balls. Do not scrub or use excessive force. Pat dry using gauze sponges, not tissue or cotton balls. Primary Dressing: Gauze 1 x Per Day/30 Days Discharge Instructions: over santyl gauze moistened with saline Primary Dressing: Santyl Collagenase Ointment, 30 (gm), tube (Generic) 1 x Per Day/30 Days Secondary Dressing: Bordered Gauze Sterile-HBD 4x4 (in/in) (Generic) 1 x Per Day/30 Days Discharge Instructions: Cover wound with Bordered Guaze Sterile as directed Wound #3 - Forearm Wound Laterality: Left Cleanser: Byram Ancillary Kit - 15 Day Supply (Generic) 1 x Per Day/30 Days Discharge Instructions: Use supplies as instructed; Kit contains: (15) Saline Bullets; (15) 3x3 Gauze; 15 pr Gloves Cleanser: Normal Saline (Generic) 1 x Per Day/30 Days Discharge Instructions: Wash your hands with soap and water. Remove old dressing, discard into plastic bag and place into trash. Cleanse the wound with Normal Saline prior to applying a clean dressing using gauze sponges, not tissues or cotton balls. Do not scrub or use excessive force. Pat dry using gauze sponges, not tissue or cotton balls. Primary Dressing:  Xeroform 4x4-HBD (in/in) (Generic) 1 x Per Day/30 Days Discharge Instructions: Apply Xeroform 4x4-HBD (in/in) as directed Secondary Dressing: Kerlix 4.5 x 4.1 (in/yd) (Generic) 1 x Per Day/30 Days Discharge Instructions: Apply Kerlix 4.5 x 4.1 (in/yd) as instructed Secured With: 39M Medipore H Soft Cloth Surgical Tape, 2x2 (in/yd) (Generic) 1 x Per Day/30 Days Consults o Vascular - [place] [facility]IN VASCULAR FOR ABI'S AND TBI'S - (ICD10 L89.513 - Pressure ulcer of right ankle, stage 3) Electronic Signature(s) Signed: 10/28/2020 5:11:44 PM By: Worthy Keeler PA-C Signed: 11/02/2020 3:51:10 PM By: Carlene Coria RN Entered By: Carlene Coria on 10/28/2020 15:21:35 Wojdyla, [name] ([id number]) -------------------------------------------------------------------------------- Problem List Details Patient Name: ASANTE, RITACCO Date of Service: 10/28/2020 2:45 PM Medical Record Number: [id number] Patient Account Number: [id number] Date of Birth/Sex: [birth year] (87 y.o. F) Treating RN: Carlene Coria Primary Care Provider: Viviana Simpler Other Clinician: Referring Provider: Viviana Simpler Treating Provider/Extender: Skipper Cliche in Treatment: 1 Active Problems ICD-10 Encounter Code Description Active Date MDM Diagnosis L89.513 Pressure ulcer of right ankle, stage 3 10/21/2020 No Yes L89.623 Pressure ulcer of left heel, stage 3 10/21/2020 No Yes S51.802A Unspecified open wound of left forearm, initial encounter 10/21/2020 No Yes I10 Essential (primary) hypertension 10/21/2020 No Yes I25.10 Atherosclerotic heart disease of native coronary artery without angina 10/21/2020 No Yes pectoris I73.89 Other specified peripheral vascular diseases 10/21/2020 No Yes Inactive Problems Resolved Problems Electronic Signature(s) Signed: 10/28/2020 2:44:26 PM By: Worthy Keeler PA-C Entered By: Worthy Keeler on 10/28/2020 14:44:26 Eagles, [name]  ([id number]) -------------------------------------------------------------------------------- Progress Note Details Patient Name: [name] Date of Service: 10/28/2020 2:45 PM Medical Record Number: [id number] Patient Account Number: [id number] Date of Birth/Sex: 1928-01-17 (87 y.o. F) Treating  RN: Carlene Coria Primary Care Provider: Viviana Simpler Other Clinician: Referring Provider: Viviana Simpler Treating Provider/Extender: Skipper Cliche in Treatment: 1 Subjective Chief Complaint Information obtained from Patient Pressure ulcer right ankle and left heel and left forearm skin tear History of Present Illness (HPI) 10/21/2020 upon evaluation today patient presents for initial inspection here in our clinic concerning issues that she has been having quite some time in regard to her ankle and since she has been in the hospital with regard to the heel. The ankle in fact has been 5-6 years at least I am told. With that being said the heel ulcer occurred when she was in the hospital in December for hip surgery when she fractured her hip. She also was in the hospital Tuesday for altered mental status. Really there was nothing that was identified as the cause for this. She did have a. Fortunately there does not appear to be any signs of infection she tells me that she has had she believes arterial studies At [facility] clinic I could not find Those studies at this point. Nonetheless I will continue to look and see what I can find before Then. The patient also has a skin tear on her arm this occurred more recently when she bumped this at home. The patient does have a history of hypertension, coronary artery disease, and peripheral vascular disease stated. 10/28/2020 I was able to find the patient's chart currently which shows that she did have an arterial study performed in May 2019. This showed that she had a abnormal right toe brachial index and a normal left toe brachial index. She was  noncompressible as far as ABIs were concerned. She did appear to have triphasic flow at that time. Unfortunately the wound that is commented on in the report that I printed off and read mentions the same wound on the ankle that we are still dealing with at this point. Unfortunately this has not healed. And its been quite sometime about 3 years now. Fortunately there does not appear to be any signs of active infection systemically at this point. I think that the patient has done well with the Santyl over the past week which is good news. Patient's caregiver which is her daughter-in-law is concerned about the fact that she really does not feel qualified to be able to change the dressings and take care of this issue. There does not appear to be any signs of anything untoward going on at this point. I think she is done a great job applying the [employer] and I think that has done a great job for the patient is for soften up some of the necrotic tissue. With that being said I think the Xeroform on the arm also has done excellent. In general I am very pleased with where we stand. And I told the patient's daughter-in- law as well that I also feel like she has done a great job over the past week taking care of her mother. Objective Constitutional Well-nourished and well-hydrated in no acute distress. Vitals Time Taken: 2:32 PM, Height: 66 in, Weight: 76 lbs, BMI: 12.3, Temperature: 98.2 F, Pulse: 79 bpm, Respiratory Rate: 16 breaths/min, Blood Pressure: 134/75 mmHg. Respiratory normal breathing without difficulty. Psychiatric this patient is able to make decisions and demonstrates good insight into disease process. Alert and Oriented x 3. pleasant and cooperative. General Notes: Upon inspection patient's wound bed actually showed signs of good granulation and epithelization in regard to the right ankle as well as the left  forearm region. I think both of these areas are doing quite well. In regard to the  left heel this is showing signs of softening as far as the eschar is concerned but again still not cleaning up quite as well as I would like to see. I am avoiding sharp debridement at this point until we confirm that she has sufficient blood flow especially considering how long these wounds have been present. Integumentary (Hair, Skin) Wound #1 status is Open. Original cause of wound was Gradually Appeared. The date acquired was: 10/22/2014. The wound has been in treatment 1 weeks. The wound is located on the Right,Lateral Malleolus. The wound measures 1cm length x 1cm width x 0.1cm depth; 0.785cm^2 area and 0.079cm^3 volume. There is Fat Layer (Subcutaneous Tissue) exposed. There is no tunneling or undermining noted. There is a medium amount of serous drainage noted. The wound margin is flat and intact. There is medium (34-66%) pink granulation within the wound bed. There is a medium (34-66%) amount of necrotic tissue within the wound bed including Adherent Slough. BELLANY, ELBAUM ([id number]) Wound #2 status is Open. Original cause of wound was Gradually Appeared. The date acquired was: 07/09/2020. The wound has been in treatment 1 weeks. The wound is located on the Left Calcaneus. The wound measures 1.4cm length x 1.2cm width x 0.1cm depth; 1.319cm^2 area and 0.132cm^3 volume. There is Fat Layer (Subcutaneous Tissue) exposed. There is no tunneling or undermining noted. There is a small amount of serous drainage noted. There is no granulation within the wound bed. There is a large (67-100%) amount of necrotic tissue within the wound bed including Eschar and Adherent Slough. Wound #3 status is Open. Original cause of wound was Skin Tear/Laceration. The date acquired was: 10/18/2020. The wound has been in treatment 1 weeks. The wound is located on the Left Forearm. The wound measures 1.8cm length x 0.5cm width x 0.1cm depth; 0.707cm^2 area and 0.071cm^3 volume. There is Fat Layer (Subcutaneous Tissue)  exposed. There is no tunneling or undermining noted. There is a medium amount of sanguinous drainage noted. There is large (67-100%) red, friable granulation within the wound bed. There is a small (1-33%) amount of necrotic tissue within the wound bed including Eschar. Assessment Active Problems ICD-10 Pressure ulcer of right ankle, stage 3 Pressure ulcer of left heel, stage 3 Unspecified open wound of left forearm, initial encounter Essential (primary) hypertension Atherosclerotic heart disease of native coronary artery without angina pectoris Other specified peripheral vascular diseases Plan Follow-up Appointments: Return Appointment in 1 week. Home Health: ADMIT to [facility] for wound care. May utilize formulary equivalent dressing for wound treatment orders unless otherwise specified. Home Health Nurse may visit PRN to address patient s wound care needs. Edema Control - Lymphedema / Segmental Compressive Device / Other: Elevate, Exercise Daily and Avoid Standing for Long Periods of Time. Elevate legs to the level of the heart and pump ankles as often as possible Elevate leg(s) parallel to the floor when sitting. Non-Wound Condition: Additional non-wound orders/instructions: - 1 st met head right foot foam and coverlet Off-Loading: Turn and reposition every 2 hours Consults ordered were: Vascular - [place] VEIN VASCULAR FOR ABI'S AND TBI'S WOUND #1: - Malleolus Wound Laterality: Right, Lateral Cleanser: Byram Ancillary Kit - 15 Day Supply (Generic) 1 x Per Day/30 Days Discharge Instructions: Use supplies as instructed; Kit contains: (15) Saline Bullets; (15) 3x3 Gauze; 15 pr Gloves Cleanser: Normal Saline (Generic) 1 x Per Day/30 Days Discharge Instructions: Wash your hands with  soap and water. Remove old dressing, discard into plastic bag and place into trash. Cleanse the wound with Normal Saline prior to applying a clean dressing using gauze sponges, not tissues or cotton  balls. Do not scrub or use excessive force. Pat dry using gauze sponges, not tissue or cotton balls. Primary Dressing: Santyl Collagenase Ointment, 30 (gm), tube (Generic) 1 x Per Day/30 Days Secondary Dressing: Coverlet Latex-Free Fabric Adhesive Dressings (Generic) 1 x Per Day/30 Days Discharge Instructions: 1.5 x 2 Secondary Dressing: Gauze 1 x Per Day/30 Days Discharge Instructions: over santyl gauze moistened with saline WOUND #2: - Calcaneus Wound Laterality: Left Cleanser: Byram Ancillary Kit - 15 Day Supply (Generic) 1 x Per Day/30 Days Discharge Instructions: Use supplies as instructed; Kit contains: (15) Saline Bullets; (15) 3x3 Gauze; 15 pr Gloves Cleanser: Normal Saline (Generic) 1 x Per Day/30 Days Discharge Instructions: Wash your hands with soap and water. Remove old dressing, discard into plastic bag and place into trash. Cleanse the wound with Normal Saline prior to applying a clean dressing using gauze sponges, not tissues or cotton balls. Do not scrub or use excessive force. Pat dry using gauze sponges, not tissue or cotton balls. Primary Dressing: Gauze 1 x Per Day/30 Days Discharge Instructions: over santyl gauze moistened with saline Primary Dressing: Santyl Collagenase Ointment, 30 (gm), tube (Generic) 1 x Per Day/30 Days Secondary Dressing: Bordered Gauze Sterile-HBD 4x4 (in/in) (Generic) 1 x Per Day/30 Days Discharge Instructions: Cover wound with Bordered Guaze Sterile as directed WOUND #3: - Forearm Wound Laterality: Left Cleanser: Byram Ancillary Kit - 15 Day Supply (Generic) 1 x Per Day/30 Days Discharge Instructions: Use supplies as instructed; Kit contains: (15) Saline Bullets; (15) 3x3 Gauze; 15 pr Gloves Wisnieski, [name] ([id number]) Cleanser: Normal Saline (Generic) 1 x Per Day/30 Days Discharge Instructions: Wash your hands with soap and water. Remove old dressing, discard into plastic bag and place into trash. Cleanse the wound with Normal Saline prior to  applying a clean dressing using gauze sponges, not tissues or cotton balls. Do not scrub or use excessive force. Pat dry using gauze sponges, not tissue or cotton balls. Primary Dressing: Xeroform 4x4-HBD (in/in) (Generic) 1 x Per Day/30 Days Discharge Instructions: Apply Xeroform 4x4-HBD (in/in) as directed Secondary Dressing: Kerlix 4.5 x 4.1 (in/yd) (Generic) 1 x Per Day/30 Days Discharge Instructions: Apply Kerlix 4.5 x 4.1 (in/yd) as instructed Secured With: 60M Medipore H Soft Cloth Surgical Tape, 2x2 (in/yd) (Generic) 1 x Per Day/30 Days 1. Would recommend currently that going continue with the wound care measures as before with regard to the Santyl I think this is still appropriate. 2. I am also can recommend that we have the patient continue with the border gauze dressing to cover I think that her large Band-Aid is still absolutely appropriate as well. 3. She can clean with the wound cleanser. I think that is a good way to go here. 4. I am also going to suggest we continue with Xeroform to the left forearm I think that is doing a great job and this appears to be very close to complete resolution. We will see patient back for reevaluation in 1 week here in the clinic. If anything worsens or changes patient will contact our office for additional recommendations. With regard to the dressing changes I Georgina Peer have her discharge nurse very specifically go through the discharge instructions with the patient and her daughter today in order to make sure that she is comfortable with what to do going forward  with these dressings. I do not want her to fill unprepared to perform the dressing changes at home. We discussed home health but thing it is they are not good to come out to apply the medication every single day. That something that is going to fall back on the family 1 where another. I am hopeful that with good training showed doing an excellent job with this I think she already has done also  job to be perfectly honest. Engineer, maintenance) Signed: 10/28/2020 3:38:23 PM By: Worthy Keeler PA-C Entered By: Worthy Keeler on 10/28/2020 15:38:23 Ohanesian, [name] ([id number]) -------------------------------------------------------------------------------- SuperBill Details Patient Name: [name] Date of Service: 10/28/2020 Medical Record Number: [id number] Patient Account Number: [id number] Date of Birth/Sex: 1928-07-06 (87 y.o. F) Treating RN: Carlene Coria Primary Care Provider: Viviana Simpler Other Clinician: Referring Provider: Viviana Simpler Treating Provider/Extender: Skipper Cliche in Treatment: 1 Diagnosis Coding ICD-10 Codes Code Description [phone] Pressure ulcer of right ankle, stage 3 L89.623 Pressure ulcer of left heel, stage 3 S51.802A Unspecified open wound of left forearm, initial encounter [place] (primary) hypertension I25.10 Atherosclerotic heart disease of native coronary artery without angina pectoris I73.89 Other specified peripheral vascular diseases Facility Procedures CPT4 Code: 87867672 Description: [phone] - WOUND CARE VISIT-LEV 2 EST PT Modifier: Quantity: 1 Physician Procedures CPT4 Code: 9628366 Description: 99213 - WC PHYS LEVEL 3 - EST PT Modifier: Quantity: 1 CPT4 Code: Description: ICD-10 Diagnosis Description L89.513 Pressure ulcer of right ankle, stage 3 L89.623 Pressure ulcer of left heel, stage 3 S51.802A Unspecified open wound of left forearm, initial encounter I10 Essential (primary) hypertension Modifier: Quantity: Electronic Signature(s) Signed: 10/28/2020 3:38:34 PM By: Worthy Keeler PA-C Entered By: Worthy Keeler on 10/28/2020 15:38:34

## 2020-10-31 ENCOUNTER — Ambulatory Visit: Payer: PPO | Admitting: Internal Medicine

## 2020-10-31 DIAGNOSIS — Z0289 Encounter for other administrative examinations: Secondary | ICD-10-CM

## 2020-11-02 NOTE — Progress Notes (Signed)
RUTHA, MELGOZA ([id number]) Visit Report for 10/28/2020 Arrival Information Details Patient Name: NAYDELIN, ZIEGLER Date of Service: 10/28/2020 2:45 PM Medical Record Number: [id number] Patient Account Number: [id number] Date of Birth/Sex: [birth year] (87 y.o. F) Treating RN: Donnamarie Poag Primary Care Derek Laughter: Viviana Simpler Other Clinician: Referring Maria Coin: Viviana Simpler Treating Dailey Buccheri/Extender: Skipper Cliche in Treatment: 1 Visit Information History Since Last Visit Added or deleted any medications: No Patient Arrived: [name] Had a fall or experienced change in No Arrival Time: 14:27 activities of daily living that may affect Accompanied By: daughter in law risk of falls: Transfer Assistance: None Hospitalized since last visit: No Patient Has Alerts: Yes Has Dressing in Place as Prescribed: Yes Patient Alerts: NOT DIABETIC Pain Present Now: Yes LEFT LEG NONCOMPRESSIBLE Electronic Signature(s) Signed: 10/28/2020 4:01:56 PM By: Donnamarie Poag Entered By: Donnamarie Poag on 10/28/2020 14:32:21 [place], [name] ([id number]) -------------------------------------------------------------------------------- Clinic Level of Care Assessment Details Patient Name: [name] Date of Service: 10/28/2020 2:45 PM Medical Record Number: [id number] Patient Account Number: [id number] Date of Birth/Sex: 1928/04/08 (87 y.o. F) Treating RN: Carlene Coria Primary Care Dietrich Ke: Viviana Simpler Other Clinician: Referring Surina Storts: Viviana Simpler Treating Golda Zavalza/Extender: Skipper Cliche in Treatment: 1 Clinic Level of Care Assessment Items TOOL 4 Quantity Score X - Use when only an EandM is performed on FOLLOW-UP visit 1 0 ASSESSMENTS - Nursing Assessment / Reassessment X - Reassessment of Co-morbidities (includes updates in patient status) 1 10 X- 1 5 Reassessment of Adherence to Treatment Plan ASSESSMENTS - Wound and Skin Assessment / Reassessment X - Simple Wound Assessment /  Reassessment - one wound 1 5 '[]'  - 0 Complex Wound Assessment / Reassessment - multiple wounds '[]'  - 0 Dermatologic / Skin Assessment (not related to wound area) ASSESSMENTS - Focused Assessment '[]'  - Circumferential Edema Measurements - multi extremities 0 '[]'  - 0 Nutritional Assessment / Counseling / Intervention '[]'  - 0 Lower Extremity Assessment (monofilament, tuning fork, pulses) '[]'  - 0 Peripheral Arterial Disease Assessment (using hand held doppler) ASSESSMENTS - Ostomy and/or Continence Assessment and Care '[]'  - Incontinence Assessment and Management 0 '[]'  - 0 Ostomy Care Assessment and Management (repouching, etc.) PROCESS - Coordination of Care X - Simple Patient / Family Education for ongoing care 1 15 '[]'  - 0 Complex (extensive) Patient / Family Education for ongoing care '[]'  - 0 Staff obtains Programmer, systems, Records, Test Results / Process Orders '[]'  - 0 Staff telephones HHA, Nursing Homes / Clarify orders / etc '[]'  - 0 Routine Transfer to another Facility (non-emergent condition) '[]'  - 0 Routine Hospital Admission (non-emergent condition) '[]'  - 0 New Admissions / Biomedical engineer / Ordering NPWT, Apligraf, etc. '[]'  - 0 Emergency Hospital Admission (emergent condition) X- 1 10 Simple Discharge Coordination '[]'  - 0 Complex (extensive) Discharge Coordination PROCESS - Special Needs '[]'  - Pediatric / Minor Patient Management 0 '[]'  - 0 Isolation Patient Management '[]'  - 0 Hearing / Language / Visual special needs '[]'  - 0 Assessment of Community assistance (transportation, D/C planning, etc.) '[]'  - 0 Additional assistance / Altered mentation '[]'  - 0 Support Surface(s) Assessment (bed, cushion, seat, etc.) INTERVENTIONS - Wound Cleansing / Measurement ABRIELLA, FILKINS ([id number]) X- 1 5 Simple Wound Cleansing - one wound '[]'  - 0 Complex Wound Cleansing - multiple wounds '[]'  - 0 Wound Imaging (photographs - any number of wounds) '[]'  - 0 Wound Tracing (instead of  photographs) X- 1 5 Simple Wound Measurement - one wound '[]'  - 0 Complex Wound Measurement - multiple wounds INTERVENTIONS -  Wound Dressings X - Small Wound Dressing one or multiple wounds 1 10 '[]'  - 0 Medium Wound Dressing one or multiple wounds '[]'  - 0 Large Wound Dressing one or multiple wounds X- 1 5 Application of Medications - topical '[]'  - 0 Application of Medications - injection INTERVENTIONS - Miscellaneous '[]'  - External ear exam 0 '[]'  - 0 Specimen Collection (cultures, biopsies, blood, body fluids, etc.) '[]'  - 0 Specimen(s) / Culture(s) sent or taken to Lab for analysis '[]'  - 0 Patient Transfer (multiple staff / Harrel Lemon Lift / Similar devices) '[]'  - 0 Simple Staple / Suture removal (25 or less) '[]'  - 0 Complex Staple / Suture removal (26 or more) '[]'  - 0 Hypo / Hyperglycemic Management (close monitor of Blood Glucose) '[]'  - 0 Ankle / Brachial Index (ABI) - do not check if billed separately X- 1 5 Vital Signs Has the patient been seen at the hospital within the last three years: Yes Total Score: 75 Level Of Care: New/Established - Level 2 Electronic Signature(s) Signed: 11/02/2020 3:51:10 PM By: Carlene Coria RN Entered By: Carlene Coria on 10/28/2020 15:20:53 [place], [name] ([id number]) -------------------------------------------------------------------------------- Encounter Discharge Information Details Patient Name: GUSTAVIA, CARIE Date of Service: 10/28/2020 2:45 PM Medical Record Number: [id number] Patient Account Number: [id number] Date of Birth/Sex: [birth year] (87 y.o. F) Treating RN: Donnamarie Poag Primary Care Shermika Balthaser: Viviana Simpler Other Clinician: Referring Nailyn Dearinger: Viviana Simpler Treating Mathew Postiglione/Extender: Skipper Cliche in Treatment: 1 Encounter Discharge Information Items Discharge Condition: Stable Ambulatory Status: Walker Discharge Destination: Home Transportation: Private Auto Accompanied By: daughter in law Schedule Follow-up Appointment:  Yes Clinical Summary of Care: Electronic Signature(s) Signed: 10/28/2020 4:01:56 PM By: Donnamarie Poag Entered By: Donnamarie Poag on 10/28/2020 15:58:00 Rembold, [name] ([id number]) -------------------------------------------------------------------------------- Lower Extremity Assessment Details Patient Name: ANNISHA, BAAR Date of Service: 10/28/2020 2:45 PM Medical Record Number: [id number] Patient Account Number: [id number] Date of Birth/Sex: 09/09/1927 (87 y.o. F) Treating RN: Donnamarie Poag Primary Care Tomeeka Plaugher: Viviana Simpler Other Clinician: Referring Nyoka Alcoser: Viviana Simpler Treating Canyon Lohr/Extender: Skipper Cliche in Treatment: 1 Edema Assessment Assessed: [Left: Yes] [Right: Yes] Edema: [Left: No] [Right: No] Calf Left: Right: Point of Measurement: 30 cm From Medial Instep 20.5 cm 20.5 cm Ankle Left: Right: Point of Measurement: 9 cm From Medial Instep 18 cm 19 cm Vascular Assessment Pulses: Dorsalis Pedis Palpable: [Left:Yes] [Right:Yes] Electronic Signature(s) Signed: 10/28/2020 4:01:56 PM By: Donnamarie Poag Entered By: Donnamarie Poag on 10/28/2020 14:46:11 [place], [name] ([id number]) -------------------------------------------------------------------------------- Multi Wound Chart Details Patient Name: [name] Date of Service: 10/28/2020 2:45 PM Medical Record Number: [id number] Patient Account Number: [id number] Date of Birth/Sex: 24-Apr-1928 (87 y.o. F) Treating RN: Carlene Coria Primary Care Abbi Mancini: Viviana Simpler Other Clinician: Referring Emilyanne Mcgough: Viviana Simpler Treating Destina Mantei/Extender: Skipper Cliche in Treatment: 1 Vital Signs Height(in): 89 Pulse(bpm): 34 Weight(lbs): 56 Blood Pressure(mmHg): 134/75 Body Mass Index(BMI): 12 Temperature(F): 98.2 Respiratory Rate(breaths/min): 16 Photos: Wound Location: Right, Lateral Malleolus Left Calcaneus Left Forearm Wounding Event: Gradually Appeared Gradually Appeared Skin  Tear/Laceration Primary Etiology: Pressure Ulcer Pressure Ulcer Skin Tear Comorbid History: Arrhythmia, Coronary Artery Arrhythmia, Coronary Artery Arrhythmia, Coronary Artery Disease, Hypertension, Peripheral Disease, Hypertension, Peripheral Disease, Hypertension, Peripheral Arterial Disease, History of pressure Arterial Disease, History of pressure Arterial Disease, History of pressure wounds, Osteoarthritis wounds, Osteoarthritis wounds, Osteoarthritis Date Acquired: 10/22/2014 07/09/2020 10/18/2020 Weeks of Treatment: '1 1 1 ' Wound Status: Open Open Open Measurements L x W x D (cm) 1x1x0.1 1.4x1.2x0.1 1.8x0.5x0.1 Area (cm) : 0.785 1.319 0.707 Volume (cm) : 0.079  0.132 0.071 % Reduction in Area: 0.00% 13.90% 24.90% % Reduction in Volume: 0.00% 13.70% 24.50% Classification: Category/Stage III Unstageable/Unclassified Full Thickness Without Exposed Support Structures Exudate Amount: Medium Small Medium Exudate Type: Serous Serous Sanguinous Exudate Color: amber amber red Wound Margin: Flat and Intact N/A N/A Granulation Amount: Medium (34-66%) None Present (0%) Large (67-100%) Granulation Quality: Pink N/A Red, Friable Necrotic Amount: Medium (34-66%) Large (67-100%) Small (1-33%) Necrotic Tissue: Adherent [place]o Exposed Structures: Fat Layer (Subcutaneous Tissue): Fat Layer (Subcutaneous Tissue): Fat Layer (Subcutaneous Tissue): Yes Yes Yes Fascia: No Fascia: No Fascia: No Tendon: No Tendon: No Tendon: No Muscle: No Muscle: No Muscle: No Joint: No Joint: No Joint: No Bone: No Bone: No Bone: No Epithelialization: None None Medium (34-66%) Treatment Notes Electronic Signature(s) Signed: 11/02/2020 3:51:10 PM By: Carlene Coria RN Entered By: Carlene Coria on 10/28/2020 15:07:27 [place], [name] ([id number]) [name], [name] ([id number]) -------------------------------------------------------------------------------- Multi-Disciplinary Care Plan  Details Patient Name: EVONNA, STOLTZ Date of Service: 10/28/2020 2:45 PM Medical Record Number: [id number] Patient Account Number: [id number] Date of Birth/Sex: March 07, 1928 (87 y.o. F) Treating RN: Carlene Coria Primary Care Arville Postlewaite: Viviana Simpler Other Clinician: Referring Taesean Reth: Viviana Simpler Treating Scarlettrose Costilow/Extender: Skipper Cliche in Treatment: 1 Active Inactive Abuse / Safety / Falls / Self Care Management Nursing Diagnoses: Potential for injury related to falls Goals: Patient will not experience any injury related to falls Date Initiated: 10/21/2020 Target Resolution Date: 11/20/2020 Goal Status: Active Interventions: Assess Activities of Daily Living upon admission and as needed Assess fall risk on admission and as needed Assess: immobility, friction, shearing, incontinence upon admission and as needed Assess impairment of mobility on admission and as needed per policy Assess personal safety and home safety (as indicated) on admission and as needed Assess self care needs on admission and as needed Notes: Wound/Skin Impairment Nursing Diagnoses: Impaired tissue integrity Goals: Patient/caregiver will verbalize understanding of skin care regimen Date Initiated: 10/21/2020 Target Resolution Date: 11/20/2020 Goal Status: Active Ulcer/skin breakdown will have a volume reduction of 30% by week 4 Date Initiated: 10/21/2020 Target Resolution Date: 11/20/2020 Goal Status: Active Ulcer/skin breakdown will have a volume reduction of 50% by week 8 Date Initiated: 10/21/2020 Target Resolution Date: 12/21/2020 Goal Status: Active Ulcer/skin breakdown will have a volume reduction of 80% by week 12 Date Initiated: 10/21/2020 Target Resolution Date: 01/20/2021 Goal Status: Active Ulcer/skin breakdown will heal within 14 weeks Date Initiated: 10/21/2020 Target Resolution Date: 02/20/2021 Goal Status: Active Interventions: Assess patient/caregiver ability to obtain necessary  supplies Assess patient/caregiver ability to perform ulcer/skin care regimen upon admission and as needed Assess ulceration(s) every visit Notes: Electronic Signature(s) Signed: 11/02/2020 3:51:10 PM By: Carlene Coria RN Entered By: Carlene Coria on 10/28/2020 [place], [name] ([name], [name] ([id number]) -------------------------------------------------------------------------------- Pain Assessment Details Patient Name: WILLENE, HOLIAN Date of Service: 10/28/2020 2:45 PM Medical Record Number: [id number] Patient Account Number: [id number] Date of Birth/Sex: [birth year] (87 y.o. F) Treating RN: Donnamarie Poag Primary Care Kj Imbert: Viviana Simpler Other Clinician: Referring [place] Retana: Viviana Simpler Treating Charita Lindenberger/Extender: Skipper Cliche in Treatment: 1 Active Problems Location of Pain Severity and Description of Pain Patient Has Paino Yes Site Locations Rate the pain. Current Pain Level: 2 Pain Management and Medication Current Pain Management: Electronic Signature(s) Signed: 10/28/2020 4:01:56 PM By: Donnamarie Poag Entered By: Donnamarie Poag on 10/28/2020 14:32:48 Kernen, [name] ([id number]) -------------------------------------------------------------------------------- Patient/Caregiver Education Details Patient Name: [name] Date of Service: 10/28/2020 2:45 PM Medical Record Number: [id number] Patient Account  Number: [id number] Date of Birth/Gender: 07/19/1927 (87 y.o. F) Treating RN: Carlene Coria Primary Care Physician: Viviana Simpler Other Clinician: Referring Physician: Viviana Simpler Treating Physician/Extender: Skipper Cliche in Treatment: 1 Education Assessment Education Provided To: Patient Education Topics Provided Wound/Skin Impairment: Methods: Explain/Verbal Responses: State content correctly Electronic Signature(s) Signed: 11/02/2020 3:51:10 PM By: Carlene Coria RN Entered By: Carlene Coria on 10/28/2020 15:17:34 Kling,  [name] ([id number]) -------------------------------------------------------------------------------- Wound Assessment Details Patient Name: CIRA, DEYOE Date of Service: 10/28/2020 2:45 PM Medical Record Number: [id number] Patient Account Number: [id number] Date of Birth/Sex: 1928/05/09 (87 y.o. F) Treating RN: Donnamarie Poag Primary Care Clinton Dragone: Viviana Simpler Other Clinician: Referring Brisha Mccabe: Viviana Simpler Treating Chevonne Bostrom/Extender: Skipper Cliche in Treatment: 1 Wound Status Wound Number: 1 Primary Pressure Ulcer Etiology: Wound Location: Right, Lateral Malleolus Wound Open Wounding Event: Gradually Appeared Status: Date Acquired: 10/22/2014 Comorbid Arrhythmia, Coronary Artery Disease, Hypertension, Weeks Of Treatment: 1 History: Peripheral Arterial Disease, History of pressure wounds, Clustered Wound: No Osteoarthritis Photos Wound Measurements Length: (cm) 1 Width: (cm) 1 Depth: (cm) 0.1 Area: (cm) 0.785 Volume: (cm) 0.079 % Reduction in Area: 0% % Reduction in Volume: 0% Epithelialization: None Tunneling: No Undermining: No Wound Description Classification: Category/Stage III Wound Margin: Flat and Intact Exudate Amount: Medium Exudate Type: Serous Exudate Color: amber Foul Odor After Cleansing: No Slough/Fibrino Yes Wound Bed Granulation Amount: Medium (34-66%) Exposed Structure Granulation Quality: Pink Fascia Exposed: No Necrotic Amount: Medium (34-66%) Fat Layer (Subcutaneous Tissue) Exposed: Yes Necrotic Quality: Adherent Slough Tendon Exposed: No Muscle Exposed: No Joint Exposed: No Bone Exposed: No Treatment Notes Wound #1 (Malleolus) Wound Laterality: Right, Lateral Cleanser Byram Ancillary Kit - 15 Day Supply Discharge Instruction: Use supplies as instructed; Kit contains: (15) Saline Bullets; (15) 3x3 Gauze; 15 pr Gloves Normal Saline FENDI, MEINHARDT ([id number]) Discharge Instruction: Wash your hands with soap and water.  Remove old dressing, discard into plastic bag and place into trash. Cleanse the wound with Normal Saline prior to applying a clean dressing using gauze sponges, not tissues or cotton balls. Do not scrub or use excessive force. Pat dry using gauze sponges, not tissue or cotton balls. Peri-Wound Care Topical Primary Dressing Santyl Collagenase Ointment, 30 (gm), tube Secondary Dressing Coverlet Latex-Free Fabric Adhesive Dressings Discharge Instruction: 1.5 x 2 Gauze Discharge Instruction: over santyl gauze moistened with saline Secured With Compression Wrap Compression Stockings Add-Ons Electronic Signature(s) Signed: 10/28/2020 4:01:56 PM By: Donnamarie Poag Entered By: Donnamarie Poag on 10/28/2020 14:39:28 [place], [name] ([id number]) -------------------------------------------------------------------------------- Wound Assessment Details Patient Name: EVALEEN, SANT Date of Service: 10/28/2020 2:45 PM Medical Record Number: [id number] Patient Account Number: [id number] Date of Birth/Sex: [birth year] (87 y.o. F) Treating RN: Donnamarie Poag Primary Care Demarie Uhlig: Viviana Simpler Other Clinician: Referring Meygan Kyser: Viviana Simpler Treating Zymarion Favorite/Extender: Skipper Cliche in Treatment: 1 Wound Status Wound Number: 2 Primary Pressure Ulcer Etiology: Wound Location: Left Calcaneus Wound Open Wounding Event: Gradually Appeared Status: Date Acquired: 07/09/2020 Comorbid Arrhythmia, Coronary Artery Disease, Hypertension, Weeks Of Treatment: 1 History: Peripheral Arterial Disease, History of pressure wounds, Clustered Wound: No Osteoarthritis Photos Wound Measurements Length: (cm) 1.4 Width: (cm) 1.2 Depth: (cm) 0.1 Area: (cm) 1.319 Volume: (cm) 0.132 % Reduction in Area: 13.9% % Reduction in Volume: 13.7% Epithelialization: None Tunneling: No Undermining: No Wound Description Classification: Unstageable/Unclassified Exudate Amount: Small Exudate Type: Serous Exudate  Color: amber Foul Odor After Cleansing: No Slough/Fibrino Yes Wound Bed Granulation Amount: None Present (0%) Exposed Structure Necrotic Amount: Large (67-100%) Fascia Exposed: No Necrotic Quality: Eschar, Adherent  Slough Fat Layer (Subcutaneous Tissue) Exposed: Yes Tendon Exposed: No Muscle Exposed: No Joint Exposed: No Bone Exposed: No Treatment Notes Wound #2 (Calcaneus) Wound Laterality: Left Cleanser Byram Ancillary Kit - 15 Day Supply Discharge Instruction: Use supplies as instructed; Kit contains: (15) Saline Bullets; (15) 3x3 Gauze; 15 pr Gloves Normal Saline LURENE, ROBLEY ([id number]) Discharge Instruction: Wash your hands with soap and water. Remove old dressing, discard into plastic bag and place into trash. Cleanse the wound with Normal Saline prior to applying a clean dressing using gauze sponges, not tissues or cotton balls. Do not scrub or use excessive force. Pat dry using gauze sponges, not tissue or cotton balls. Peri-Wound Care Topical Primary Dressing Gauze Discharge Instruction: over santyl gauze moistened with saline Santyl Collagenase Ointment, 30 (gm), tube Secondary Dressing Bordered Gauze Sterile-HBD 4x4 (in/in) Discharge Instruction: Cover wound with Bordered Guaze Sterile as directed Secured With Compression Wrap Compression Stockings Add-Ons Electronic Signature(s) Signed: 10/28/2020 4:01:56 PM By: Donnamarie Poag Entered By: Donnamarie Poag on 10/28/2020 14:40:19 [place]elli, [name] ([id number]) -------------------------------------------------------------------------------- Wound Assessment Details Patient Name: KHRYSTAL, JEANMARIE Date of Service: 10/28/2020 2:45 PM Medical Record Number: [id number] Patient Account Number: [id number] Date of Birth/Sex: [birth year] (87 y.o. F) Treating RN: Donnamarie Poag Primary Care Klohe Lovering: Viviana Simpler Other Clinician: Referring Robbin Escher: Viviana Simpler Treating Yamileth Hayse/Extender: Skipper Cliche in Treatment:  1 Wound Status Wound Number: 3 Primary Skin Tear Etiology: Wound Location: Left Forearm Wound Open Wounding Event: Skin Tear/Laceration Status: Date Acquired: 10/18/2020 Comorbid Arrhythmia, Coronary Artery Disease, Hypertension, Weeks Of Treatment: 1 History: Peripheral Arterial Disease, History of pressure wounds, Clustered Wound: No Osteoarthritis Photos Wound Measurements Length: (cm) 1.8 Width: (cm) 0.5 Depth: (cm) 0.1 Area: (cm) 0.707 Volume: (cm) 0.071 % Reduction in Area: 24.9% % Reduction in Volume: 24.5% Epithelialization: Medium (34-66%) Tunneling: No Undermining: No Wound Description Classification: Full Thickness Without Exposed Support Structures Exudate Amount: Medium Exudate Type: Sanguinous Exudate Color: red Foul Odor After Cleansing: No Slough/Fibrino No Wound Bed Granulation Amount: Large (67-100%) Exposed Structure Granulation Quality: Red, Friable Fascia Exposed: No Necrotic Amount: Small (1-33%) Fat Layer (Subcutaneous Tissue) Exposed: Yes Necrotic Quality: Eschar Tendon Exposed: No Muscle Exposed: No Joint Exposed: No Bone Exposed: No Treatment Notes Wound #3 (Forearm) Wound Laterality: Left Cleanser Byram Ancillary Kit - 15 Day Supply Discharge Instruction: Use supplies as instructed; Kit contains: (15) Saline Bullets; (15) 3x3 Gauze; 15 pr Gloves Normal Saline KHELANI, KOPS ([id number]) Discharge Instruction: Wash your hands with soap and water. Remove old dressing, discard into plastic bag and place into trash. Cleanse the wound with Normal Saline prior to applying a clean dressing using gauze sponges, not tissues or cotton balls. Do not scrub or use excessive force. Pat dry using gauze sponges, not tissue or cotton balls. Peri-Wound Care Topical Primary Dressing Xeroform 4x4-HBD (in/in) Discharge Instruction: Apply Xeroform 4x4-HBD (in/in) as directed Secondary Dressing Kerlix 4.5 x 4.1 (in/yd) Discharge Instruction: Apply  Kerlix 4.5 x 4.1 (in/yd) as instructed Secured With 33M [place] Surgical Tape, 2x2 (in/yd) Compression Wrap Compression Stockings Add-Ons Electronic Signature(s) Signed: 10/28/2020 4:01:56 PM By: Donnamarie Poag Entered By: Donnamarie Poag on 10/28/2020 14:43:01 [place], [name] ([id number]) -------------------------------------------------------------------------------- [place] Details Patient Name: [name] Date of Service: 10/28/2020 2:45 PM Medical Record Number: [id number] Patient Account Number: [id number] Date of Birth/Sex: [birth year] (87 y.o. F) Treating RN: Donnamarie Poag Primary Care Charon Smedberg: Viviana Simpler Other Clinician: Referring Trude Cansler: Viviana Simpler Treating Payzlee Ryder/Extender: Skipper Cliche in Treatment: 1 Vital Signs Time Taken:  14:32 Temperature (F): 98.2 Height (in): 66 Pulse (bpm): 79 Weight (lbs): 76 Respiratory Rate (breaths/min): 16 Body Mass Index (BMI): 12.3 Blood Pressure (mmHg): 134/75 Reference Range: 80 - 120 mg / dl Electronic Signature(s) Signed: 10/28/2020 4:01:56 PM By: Donnamarie Poag Entered ByDonnamarie Poag on 10/28/2020 14:32:37

## 2020-11-04 ENCOUNTER — Ambulatory Visit (INDEPENDENT_AMBULATORY_CARE_PROVIDER_SITE_OTHER): Payer: PPO

## 2020-11-04 ENCOUNTER — Ambulatory Visit: Payer: PPO | Admitting: Internal Medicine

## 2020-11-04 ENCOUNTER — Other Ambulatory Visit (INDEPENDENT_AMBULATORY_CARE_PROVIDER_SITE_OTHER): Payer: Self-pay | Admitting: Physician Assistant

## 2020-11-04 DIAGNOSIS — L89513 Pressure ulcer of right ankle, stage 3: Secondary | ICD-10-CM

## 2020-11-04 DIAGNOSIS — L89623 Pressure ulcer of left heel, stage 3: Secondary | ICD-10-CM | POA: Diagnosis not present

## 2020-11-07 ENCOUNTER — Other Ambulatory Visit: Payer: Self-pay | Admitting: Internal Medicine

## 2020-11-07 NOTE — Telephone Encounter (Signed)
Name of Medication: Morphine Sulfate Name of [facility]acy: [facility] or Written Date and Quantity: 09-20-20 #150 Last Office Visit and Type: 09-29-20 Next Office Visit and Type: 01-02-21 Last Controlled Substance Agreement Date: 09-29-20 Last UDS: 04-27-19  Quantity to be decreased to #90 at pt request. New CSA states that as well.

## 2020-11-11 ENCOUNTER — Other Ambulatory Visit: Payer: Self-pay

## 2020-11-11 ENCOUNTER — Encounter: Payer: PPO | Attending: Physician Assistant | Admitting: Physician Assistant

## 2020-11-11 DIAGNOSIS — W228XXA Striking against or struck by other objects, initial encounter: Secondary | ICD-10-CM | POA: Diagnosis not present

## 2020-11-11 DIAGNOSIS — L89513 Pressure ulcer of right ankle, stage 3: Secondary | ICD-10-CM | POA: Insufficient documentation

## 2020-11-11 DIAGNOSIS — L89623 Pressure ulcer of left heel, stage 3: Secondary | ICD-10-CM | POA: Insufficient documentation

## 2020-11-11 DIAGNOSIS — S51802A Unspecified open wound of left forearm, initial encounter: Secondary | ICD-10-CM | POA: Diagnosis not present

## 2020-11-11 DIAGNOSIS — I2510 Atherosclerotic heart disease of native coronary artery without angina pectoris: Secondary | ICD-10-CM | POA: Insufficient documentation

## 2020-11-11 DIAGNOSIS — L97513 Non-pressure chronic ulcer of other part of right foot with necrosis of muscle: Secondary | ICD-10-CM | POA: Diagnosis not present

## 2020-11-11 DIAGNOSIS — I10 Essential (primary) hypertension: Secondary | ICD-10-CM | POA: Diagnosis not present

## 2020-11-11 DIAGNOSIS — I739 Peripheral vascular disease, unspecified: Secondary | ICD-10-CM | POA: Insufficient documentation

## 2020-11-11 DIAGNOSIS — L89620 Pressure ulcer of left heel, unstageable: Secondary | ICD-10-CM | POA: Diagnosis not present

## 2020-11-11 NOTE — Progress Notes (Addendum)
[name], [name] ([id number]) Visit Report for 11/11/2020 Chief Complaint Document Details Patient Name: [name], [name]. Date of Service: 11/11/2020 2:30 PM Medical Record Number: [id number] Patient Account Number: [id number] Date of Birth/Sex: Mar 13, 1928 (87 y.o. F) Treating RN: Carlene Coria Primary Care Provider: Viviana Simpler Other Clinician: Referring Provider: Viviana Simpler Treating Provider/Extender: Skipper Cliche in Treatment: 3 Information Obtained from: Patient Chief Complaint Pressure ulcer right ankle and left heel and left forearm skin tear Electronic Signature(s) Signed: 11/11/2020 2:17:28 PM By: Worthy Keeler PA-C Entered By: Worthy Keeler on 11/11/2020 14:17:28 Camberos, [name] ([id number]) -------------------------------------------------------------------------------- Debridement Details Patient Name: [name] Date of Service: 11/11/2020 2:30 PM Medical Record Number: [id number] Patient Account Number: [id number] Date of Birth/Sex: 13-Aug-1927 (87 y.o. F) Treating RN: Carlene Coria Primary Care Provider: Viviana Simpler Other Clinician: Referring Provider: Viviana Simpler Treating Provider/Extender: Skipper Cliche in Treatment: 3 Debridement Performed for Wound #1 Right,Lateral Malleolus Assessment: Performed By: Physician Tommie Sams., PA-C Debridement Type: Debridement Level of Consciousness (Pre- Awake and Alert procedure): Pre-procedure Verification/Time Out Yes - 15:17 Taken: Start Time: 15:17 Pain Control: Lidocaine 4% Topical Solution Total Area Debrided (L x W): 0.8 (cm) x 0.9 (cm) = 0.72 (cm) Tissue and other material Viable, Non-Viable, Slough, Subcutaneous, Skin: Dermis , Skin: Epidermis, Slough debrided: Level: Skin/Subcutaneous Tissue Debridement Description: Excisional Instrument: Curette Bleeding: Moderate Hemostasis Achieved: Pressure End Time: 15:20 Procedural Pain: 0 Post Procedural Pain: 0 Response to Treatment:  Procedure was tolerated well Level of Consciousness (Post- Awake and Alert procedure): Post Debridement Measurements of Total Wound Length: (cm) 0.8 Stage: Category/Stage III Width: (cm) 0.9 Depth: (cm) 0.2 Volume: (cm) 0.113 Character of Wound/Ulcer Post Debridement: Improved Post Procedure Diagnosis Same as Pre-procedure Electronic Signature(s) Signed: 11/11/2020 6:22:42 PM By: Worthy Keeler PA-C Signed: 11/21/2020 8:33:59 AM By: Carlene Coria RN Entered By: Carlene Coria on 11/11/2020 15:18:23 Conklin, [name] ([id number]) -------------------------------------------------------------------------------- Debridement Details Patient Name: [name] Date of Service: 11/11/2020 2:30 PM Medical Record Number: [id number] Patient Account Number: [id number] Date of Birth/Sex: 1927-07-20 (87 y.o. F) Treating RN: Carlene Coria Primary Care Provider: Viviana Simpler Other Clinician: Referring Provider: Viviana Simpler Treating Provider/Extender: Skipper Cliche in Treatment: 3 Debridement Performed for Wound #2 Left Calcaneus Assessment: Performed By: Physician Tommie Sams., PA-C Debridement Type: Debridement Level of Consciousness (Pre- Awake and Alert procedure): Pre-procedure Verification/Time Out Yes - 15:17 Taken: Start Time: 15:17 Pain Control: Lidocaine 4% Topical Solution Total Area Debrided (L x W): 1.3 (cm) x 1.2 (cm) = 1.56 (cm) Tissue and other material Viable, Non-Viable, Slough, Subcutaneous, Skin: Dermis , Skin: Epidermis, Slough debrided: Level: Skin/Subcutaneous Tissue Debridement Description: Excisional Instrument: Curette Bleeding: Moderate Hemostasis Achieved: Pressure End Time: 15:20 Procedural Pain: 0 Post Procedural Pain: 0 Response to Treatment: Procedure was tolerated well Level of Consciousness (Post- Awake and Alert procedure): Post Debridement Measurements of Total Wound Length: (cm) 1.3 Stage: Unstageable/Unclassified Width: (cm)  1.2 Depth: (cm) 0.3 Volume: (cm) 0.368 Character of Wound/Ulcer Post Debridement: Improved Post Procedure Diagnosis Same as Pre-procedure Electronic Signature(s) Signed: 11/11/2020 6:22:42 PM By: Worthy Keeler PA-C Signed: 11/21/2020 8:33:59 AM By: Carlene Coria RN Entered By: Carlene Coria on 11/11/2020 15:20:29 [name] ([id number]) -------------------------------------------------------------------------------- HPI Details Patient Name: [name], [name] Date of Service: 11/11/2020 2:30 PM Medical Record Number: [id number] Patient Account Number: [id number] Date of Birth/Sex: 08/12/1927 (87 y.o. F) Treating RN: Carlene Coria Primary Care Provider: Viviana Simpler Other Clinician: Referring Provider: Viviana Simpler Treating Provider/Extender: Skipper Cliche  in Treatment: 3 History of Present Illness HPI Description: 10/21/2020 upon evaluation today patient presents for initial inspection here in our clinic concerning issues that she has been having quite some time in regard to her ankle and since she has been in the hospital with regard to the heel. The ankle in fact has been 5-6 years at least I am told. With that being said the heel ulcer occurred when she was in the hospital in December for hip surgery when she fractured her hip. She also was in the hospital Tuesday for altered mental status. Really there was nothing that was identified as the cause for this. She did have a. Fortunately there does not appear to be any signs of infection she tells me that she has had she believes arterial studies At [facility] clinic I could not find Those studies at this point. Nonetheless I will continue to look and see what I can find before Then. The patient also has a skin tear on her arm this occurred more recently when she bumped this at home. The patient does have a history of hypertension, coronary artery disease, and peripheral vascular disease stated. 10/28/2020 I was able to find the  patient's chart currently which shows that she did have an arterial study performed in May 2019. This showed that she had a abnormal right toe brachial index and a normal left toe brachial index. She was noncompressible as far as ABIs were concerned. She did appear to have triphasic flow at that time. Unfortunately the wound that is commented on in the report that I printed off and read mentions the same wound on the ankle that we are still dealing with at this point. Unfortunately this has not healed. And its been quite sometime about 3 years now. Fortunately there does not appear to be any signs of active infection systemically at this point. I think that the patient has done well with the Santyl over the past week which is good news. Patient's caregiver which is her daughter-in-law is concerned about the fact that she really does not feel qualified to be able to change the dressings and take care of this issue. There does not appear to be any signs of anything untoward going on at this point. I think she is done a great job applying the [employer] and I think that has done a great job for the patient is for soften up some of the necrotic tissue. With that being said I think the Xeroform on the arm also has done excellent. In general I am very pleased with where we stand. And I told the patient's daughter-in- law as well that I also feel like she has done a great job over the past week taking care of her mother. 11/11/2020 upon evaluation today patient appears to be doing well with regard to her wounds. She has been tolerating the dressing changes without complication her daughters been applying the Santyl which has done a great job. Ho with that being said I think now that we have good arterial study showing we can go ahead and proceed with sharp debridement at this point. Electronic Signature(s) Signed: 11/11/2020 5:35:47 PM By: Worthy Keeler PA-C Entered By: Worthy Keeler on 11/11/2020  17:35:47 Klugh, [name] ([id number]) -------------------------------------------------------------------------------- Physical Exam Details Patient Name: [name], [name] Date of Service: 11/11/2020 2:30 PM Medical Record Number: [id number] Patient Account Number: [id number] Date of Birth/Sex: 1927-08-28 (87 y.o. F) Treating RN: Carlene Coria Primary Care Provider: Viviana Simpler Other  Clinician: Referring Provider: Viviana Simpler Treating Provider/Extender: Skipper Cliche in Treatment: 3 Constitutional Well-nourished and well-hydrated in no acute distress. Respiratory normal breathing without difficulty. Psychiatric this patient is able to make decisions and demonstrates good insight into disease process. Alert and Oriented x 3. pleasant and cooperative. Notes Upon evaluation today patient did require some sharp debridement to clear away some of the necrotic debris. She tolerated that today without complication postdebridement the wound bed appears to be doing much better. Electronic Signature(s) Signed: 11/11/2020 5:36:17 PM By: Worthy Keeler PA-C Entered By: Worthy Keeler on 11/11/2020 17:36:17 Sobieski, [name] ([id number]) -------------------------------------------------------------------------------- Physician Orders Details Patient Name: [name], [name] Date of Service: 11/11/2020 2:30 PM Medical Record Number: [id number] Patient Account Number: [id number] Date of Birth/Sex: [birth year] (87 y.o. F) Treating RN: Carlene Coria Primary Care Provider: Viviana Simpler Other Clinician: Referring Provider: Viviana Simpler Treating Provider/Extender: Skipper Cliche in Treatment: 3 Verbal / Phone Orders: No Diagnosis Coding ICD-10 Coding Code Description L89.513 Pressure ulcer of right ankle, stage 3 L89.623 Pressure ulcer of left heel, stage 3 S51.802A Unspecified open wound of left forearm, initial encounter I10 Essential (primary) hypertension I25.10 Atherosclerotic  heart disease of native coronary artery without angina pectoris I73.89 Other specified peripheral vascular diseases Follow-up Appointments o Return Appointment in 2 weeks. Home Health o ADMIT to Home Health for wound care. May utilize formulary equivalent dressing for wound treatment orders unless otherwise specified. Home Health Nurse may visit PRN to address patientos wound care needs. Edema Control - Lymphedema / Segmental Compressive Device / Other o Elevate, Exercise Daily and Avoid Standing for Long Periods of Time. o Elevate legs to the level of the heart and pump ankles as often as possible o Elevate leg(s) parallel to the floor when sitting. Non-Wound Condition o Additional non-wound orders/instructions: - 1 st met head right foot foam and coverlet Off-Loading o Turn and reposition every 2 hours Wound Treatment Wound #1 - Malleolus Wound Laterality: Right, Lateral Cleanser: Byram Ancillary Kit - 15 Day Supply (Generic) 1 x Per Day/30 Days Discharge Instructions: Use supplies as instructed; Kit contains: (15) Saline Bullets; (15) 3x3 Gauze; 15 pr Gloves Cleanser: Normal Saline (Generic) 1 x Per Day/30 Days Discharge Instructions: Wash your hands with soap and water. Remove old dressing, discard into plastic bag and place into trash. Cleanse the wound with Normal Saline prior to applying a clean dressing using gauze sponges, not tissues or cotton balls. Do not scrub or use excessive force. Pat dry using gauze sponges, not tissue or cotton balls. Primary Dressing: Santyl Collagenase Ointment, 30 (gm), tube (Generic) 1 x Per Day/30 Days Secondary Dressing: Coverlet Latex-Free Fabric Adhesive Dressings (Generic) 1 x Per Day/30 Days Discharge Instructions: 1.5 x 2 Secondary Dressing: Gauze 1 x Per Day/30 Days Discharge Instructions: over santyl gauze moistened with saline Wound #2 - Calcaneus Wound Laterality: Left Cleanser: Byram Ancillary Kit - 15 Day Supply (Generic)  1 x Per Day/30 Days Discharge Instructions: Use supplies as instructed; Kit contains: (15) Saline Bullets; (15) 3x3 Gauze; 15 pr Gloves Cleanser: Normal Saline (Generic) 1 x Per Day/30 Days [name], [name] ([id number]) Discharge Instructions: Wash your hands with soap and water. Remove old dressing, discard into plastic bag and place into trash. Cleanse the wound with Normal Saline prior to applying a clean dressing using gauze sponges, not tissues or cotton balls. Do not scrub or use excessive force. Pat dry using gauze sponges, not tissue or cotton balls. Primary Dressing: Gauze 1 x Per  Day/30 Days Discharge Instructions: over santyl gauze moistened with saline Primary Dressing: Santyl Collagenase Ointment, 30 (gm), tube (Generic) 1 x Per Day/30 Days Secondary Dressing: Bordered Gauze Sterile-HBD 4x4 (in/in) (Generic) 1 x Per Day/30 Days Discharge Instructions: Cover wound with Bordered Guaze Sterile as directed Electronic Signature(s) Signed: 11/11/2020 6:22:42 PM By: Worthy Keeler PA-C Signed: 11/21/2020 8:33:59 AM By: Carlene Coria RN Entered By: Carlene Coria on 11/11/2020 15:41:21 Feher, [name] ([id number]) -------------------------------------------------------------------------------- Problem List Details Patient Name: [name], [name] Date of Service: 11/11/2020 2:30 PM Medical Record Number: [id number] Patient Account Number: [id number] Date of Birth/Sex: May 29, 1928 (87 y.o. F) Treating RN: Carlene Coria Primary Care Provider: Viviana Simpler Other Clinician: Referring Provider: Viviana Simpler Treating Provider/Extender: Skipper Cliche in Treatment: 3 Active Problems ICD-10 Encounter Code Description Active Date MDM Diagnosis L89.513 Pressure ulcer of right ankle, stage 3 10/21/2020 No Yes L89.623 Pressure ulcer of left heel, stage 3 10/21/2020 No Yes S51.802A Unspecified open wound of left forearm, initial encounter 10/21/2020 No Yes I10 Essential (primary) hypertension  10/21/2020 No Yes I25.10 Atherosclerotic heart disease of native coronary artery without angina 10/21/2020 No Yes pectoris I73.89 Other specified peripheral vascular diseases 10/21/2020 No Yes Inactive Problems Resolved Problems Electronic Signature(s) Signed: 11/11/2020 2:17:22 PM By: Worthy Keeler PA-C Entered By: Worthy Keeler on 11/11/2020 14:17:22 Milne, [name] ([id number]) -------------------------------------------------------------------------------- Progress Note Details Patient Name: [name] Date of Service: 11/11/2020 2:30 PM Medical Record Number: [id number] Patient Account Number: [id number] Date of Birth/Sex: 1928/04/08 (87 y.o. F) Treating RN: Carlene Coria Primary Care Provider: Viviana Simpler Other Clinician: Referring Provider: Viviana Simpler Treating Provider/Extender: Skipper Cliche in Treatment: 3 Subjective Chief Complaint Information obtained from Patient Pressure ulcer right ankle and left heel and left forearm skin tear History of Present Illness (HPI) 10/21/2020 upon evaluation today patient presents for initial inspection here in our clinic concerning issues that she has been having quite some time in regard to her ankle and since she has been in the hospital with regard to the heel. The ankle in fact has been 5-6 years at least I am told. With that being said the heel ulcer occurred when she was in the hospital in December for hip surgery when she fractured her hip. She also was in the hospital Tuesday for altered mental status. Really there was nothing that was identified as the cause for this. She did have a. Fortunately there does not appear to be any signs of infection she tells me that she has had she believes arterial studies At [facility] clinic I could not find Those studies at this point. Nonetheless I will continue to look and see what I can find before Then. The patient also has a skin tear on her arm this occurred more recently when she  bumped this at home. The patient does have a history of hypertension, coronary artery disease, and peripheral vascular disease stated. 10/28/2020 I was able to find the patient's chart currently which shows that she did have an arterial study performed in May 2019. This showed that she had a abnormal right toe brachial index and a normal left toe brachial index. She was noncompressible as far as ABIs were concerned. She did appear to have triphasic flow at that time. Unfortunately the wound that is commented on in the report that I printed off and read mentions the same wound on the ankle that we are still dealing with at this point. Unfortunately this has not healed. And its been quite  sometime about 3 years now. Fortunately there does not appear to be any signs of active infection systemically at this point. I think that the patient has done well with the Santyl over the past week which is good news. Patient's caregiver which is her daughter-in-law is concerned about the fact that she really does not feel qualified to be able to change the dressings and take care of this issue. There does not appear to be any signs of anything untoward going on at this point. I think she is done a great job applying the [employer] and I think that has done a great job for the patient is for soften up some of the necrotic tissue. With that being said I think the Xeroform on the arm also has done excellent. In general I am very pleased with where we stand. And I told the patient's daughter-in- law as well that I also feel like she has done a great job over the past week taking care of her mother. 11/11/2020 upon evaluation today patient appears to be doing well with regard to her wounds. She has been tolerating the dressing changes without complication her daughters been applying the Santyl which has done a great job. Ho with that being said I think now that we have good arterial study showing we can go ahead and proceed  with sharp debridement at this point. Objective Constitutional Well-nourished and well-hydrated in no acute distress. Vitals Time Taken: 2:30 PM, Height: 66 in, Weight: 76 lbs, BMI: 12.3, Temperature: 98.5 F, Pulse: 80 bpm, Respiratory Rate: 18 breaths/min, Blood Pressure: 139/61 mmHg. Respiratory normal breathing without difficulty. Psychiatric this patient is able to make decisions and demonstrates good insight into disease process. Alert and Oriented x 3. pleasant and cooperative. General Notes: Upon evaluation today patient did require some sharp debridement to clear away some of the necrotic debris. She tolerated that today without complication postdebridement the wound bed appears to be doing much better. Integumentary (Hair, Skin) Wound #1 status is Open. Original cause of wound was Gradually Appeared. The date acquired was: 10/22/2014. The wound has been in treatment 3 weeks. The wound is located on the Right,Lateral Malleolus. The wound measures 0.8cm length x 0.9cm width x 0.1cm depth; 0.565cm^2 area and 0.057cm^3 volume. There is Fat Layer (Subcutaneous Tissue) exposed. There is no tunneling or undermining noted. There is a medium [name], [name]. ([id number]) amount of serous drainage noted. The wound margin is flat and intact. There is medium (34-66%) pink granulation within the wound bed. There is a medium (34-66%) amount of necrotic tissue within the wound bed including Adherent Slough. Wound #2 status is Open. Original cause of wound was Gradually Appeared. The date acquired was: 07/09/2020. The wound has been in treatment 3 weeks. The wound is located on the Left Calcaneus. The wound measures 1.3cm length x 1.2cm width x 0.1cm depth; 1.225cm^2 area and 0.123cm^3 volume. There is Fat Layer (Subcutaneous Tissue) exposed. There is no tunneling or undermining noted. There is a small amount of serous drainage noted. There is small (1-33%) pink granulation within the wound bed. There  is a large (67-100%) amount of necrotic tissue within the wound bed including Adherent Slough. Wound #3 status is Healed - Epithelialized. Original cause of wound was Skin Tear/Laceration. The date acquired was: 10/18/2020. The wound has been in treatment 3 weeks. The wound is located on the Left Forearm. The wound measures 0cm length x 0cm width x 0cm depth; 0cm^2 area and 0cm^3 volume. There is  no tunneling or undermining noted. There is a none present amount of drainage noted. There is no granulation within the wound bed. There is no necrotic tissue within the wound bed. Assessment Active Problems ICD-10 Pressure ulcer of right ankle, stage 3 Pressure ulcer of left heel, stage 3 Unspecified open wound of left forearm, initial encounter Essential (primary) hypertension Atherosclerotic heart disease of native coronary artery without angina pectoris Other specified peripheral vascular diseases Procedures Wound #1 Pre-procedure diagnosis of Wound #1 is a Pressure Ulcer located on the Right,Lateral Malleolus . There was a Excisional Skin/Subcutaneous Tissue Debridement with a total area of 0.72 sq cm performed by Tommie Sams., PA-C. With the following instrument(s): Curette to remove Viable and Non-Viable tissue/material. Material removed includes Subcutaneous Tissue, Slough, Skin: Dermis, and Skin: Epidermis after achieving pain control using Lidocaine 4% Topical Solution. No specimens were taken. A time out was conducted at 15:17, prior to the start of the procedure. A Moderate amount of bleeding was controlled with Pressure. The procedure was tolerated well with a pain level of 0 throughout and a pain level of 0 following the procedure. Post Debridement Measurements: 0.8cm length x 0.9cm width x 0.2cm depth; 0.113cm^3 volume. Post debridement Stage noted as Category/Stage III. Character of Wound/Ulcer Post Debridement is improved. Post procedure Diagnosis Wound #1: Same as  Pre-Procedure Wound #2 Pre-procedure diagnosis of Wound #2 is a Pressure Ulcer located on the Left Calcaneus . There was a Excisional Skin/Subcutaneous Tissue Debridement with a total area of 1.56 sq cm performed by Tommie Sams., PA-C. With the following instrument(s): Curette to remove Viable and Non-Viable tissue/material. Material removed includes Subcutaneous Tissue, Slough, Skin: Dermis, and Skin: Epidermis after achieving pain control using Lidocaine 4% Topical Solution. No specimens were taken. A time out was conducted at 15:17, prior to the start of the procedure. A Moderate amount of bleeding was controlled with Pressure. The procedure was tolerated well with a pain level of 0 throughout and a pain level of 0 following the procedure. Post Debridement Measurements: 1.3cm length x 1.2cm width x 0.3cm depth; 0.368cm^3 volume. Post debridement Stage noted as Unstageable/Unclassified. Character of Wound/Ulcer Post Debridement is improved. Post procedure Diagnosis Wound #2: Same as Pre-Procedure Plan Follow-up Appointments: Return Appointment in 2 weeks. Home Health: ADMIT to [place] for wound care. May utilize formulary equivalent dressing for wound treatment orders unless otherwise specified. Home Health Nurse may visit PRN to address patient s wound care needs. Edema Control - Lymphedema / Segmental Compressive Device / Other: Elevate, Exercise Daily and Avoid Standing for Long Periods of Time. Elevate legs to the level of the heart and pump ankles as often as possible Elevate leg(s) parallel to the floor when sitting. [name], [name] ([id number]) Non-Wound Condition: Additional non-wound orders/instructions: - 1 st met head right foot foam and coverlet Off-Loading: Turn and reposition every 2 hours WOUND #1: - Malleolus Wound Laterality: Right, Lateral Cleanser: Byram Ancillary Kit - 15 Day Supply (Generic) 1 x Per Day/30 Days Discharge Instructions: Use supplies as  instructed; Kit contains: (15) Saline Bullets; (15) 3x3 Gauze; 15 pr Gloves Cleanser: Normal Saline (Generic) 1 x Per Day/30 Days Discharge Instructions: Wash your hands with soap and water. Remove old dressing, discard into plastic bag and place into trash. Cleanse the wound with Normal Saline prior to applying a clean dressing using gauze sponges, not tissues or cotton balls. Do not scrub or use excessive force. Pat dry using gauze sponges, not tissue or cotton balls. Primary Dressing:  Santyl Collagenase Ointment, 30 (gm), tube (Generic) 1 x Per Day/30 Days Secondary Dressing: Coverlet Latex-Free Fabric Adhesive Dressings (Generic) 1 x Per Day/30 Days Discharge Instructions: 1.5 x 2 Secondary Dressing: Gauze 1 x Per Day/30 Days Discharge Instructions: over santyl gauze moistened with saline WOUND #2: - Calcaneus Wound Laterality: Left Cleanser: Byram Ancillary Kit - 15 Day Supply (Generic) 1 x Per Day/30 Days Discharge Instructions: Use supplies as instructed; Kit contains: (15) Saline Bullets; (15) 3x3 Gauze; 15 pr Gloves Cleanser: Normal Saline (Generic) 1 x Per Day/30 Days Discharge Instructions: Wash your hands with soap and water. Remove old dressing, discard into plastic bag and place into trash. Cleanse the wound with Normal Saline prior to applying a clean dressing using gauze sponges, not tissues or cotton balls. Do not scrub or use excessive force. Pat dry using gauze sponges, not tissue or cotton balls. Primary Dressing: Gauze 1 x Per Day/30 Days Discharge Instructions: over santyl gauze moistened with saline Primary Dressing: Santyl Collagenase Ointment, 30 (gm), tube (Generic) 1 x Per Day/30 Days Secondary Dressing: Bordered Gauze Sterile-HBD 4x4 (in/in) (Generic) 1 x Per Day/30 Days Discharge Instructions: Cover wound with Bordered Guaze Sterile as directed 1. Would recommend that we continue with the Santyl I think that still the best way to go and the patient's daughter is in  agreement with plan. She completely sees the need for this and understands. She is doing a great job as well changing dressings. 2. I am also can recommend that we going to continue with the saline moistened gauze to cover followed by a large Band-Aid which I think is doing a great job as well. We will see patient back for reevaluation in 2 weeks here in the clinic. If anything worsens or changes patient will contact our office for additional recommendations. Electronic Signature(s) Signed: 11/11/2020 5:36:48 PM By: Worthy Keeler PA-C Entered By: Worthy Keeler on 11/11/2020 17:36:48 [name], [name] ([id number]) -------------------------------------------------------------------------------- SuperBill Details Patient Name: KARIEL, SKILLMAN Date of Service: 11/11/2020 Medical Record Number: [id number] Patient Account Number: [id number] Date of Birth/Sex: [birth year] (87 y.o. F) Treating RN: Carlene Coria Primary Care Provider: Viviana Simpler Other Clinician: Referring Provider: Viviana Simpler Treating Provider/Extender: Skipper Cliche in Treatment: 3 Diagnosis Coding ICD-10 Codes Code Description [phone] Pressure ulcer of right ankle, stage 3 L89.623 Pressure ulcer of left heel, stage 3 S51.802A Unspecified open wound of left forearm, initial encounter [place] (primary) hypertension I25.10 Atherosclerotic heart disease of native coronary artery without angina pectoris I73.89 Other specified peripheral vascular diseases Facility Procedures CPT4 Code: 21308657 Description: 84696 - DEB SUBQ TISSUE 20 SQ CM/< Modifier: Quantity: 1 CPT4 Code: Description: ICD-10 Diagnosis Description L89.513 Pressure ulcer of right ankle, stage 3 L89.623 Pressure ulcer of left heel, stage 3 Modifier: Quantity: Physician Procedures CPT4 Code: 2952841 Description: 11042 - WC PHYS SUBQ TISS 20 SQ CM Modifier: Quantity: 1 CPT4 Code: Description: ICD-10 Diagnosis Description L89.513 Pressure  ulcer of right ankle, stage 3 L89.623 Pressure ulcer of left heel, stage 3 Modifier: Quantity: Electronic Signature(s) Signed: 11/11/2020 5:37:00 PM By: Worthy Keeler PA-C Entered By: Worthy Keeler on 11/11/2020 17:36:59

## 2020-11-11 NOTE — Progress Notes (Addendum)
JEWELENE, MAIRENA ([id number]) Visit Report for 11/11/2020 Arrival Information Details Patient Name: [name], [name] Date of Service: 11/11/2020 2:30 PM Medical Record Number: [id number] Patient Account Number: [id number] Date of Birth/Sex: [birth year] (87 y.o. F) Treating RN: Carlene Coria Primary Care Antonya Leeder: Viviana Simpler Other Clinician: Referring Devonte Migues: Viviana Simpler Treating Carmin Dibartolo/Extender: Skipper Cliche in Treatment: 3 Visit Information History Since Last Visit Added or deleted any medications: No Patient Arrived: [name] Had a fall or experienced change in No Arrival Time: 14:30 activities of daily living that may affect Accompanied By: daughter in law risk of falls: Transfer Assistance: None Hospitalized since last visit: No Patient Identification Verified: Yes Pain Present Now: No Secondary Verification Process Completed: Yes Patient Has Alerts: Yes Patient Alerts: NOT DIABETIC TBI left .77 TBI right .76 Electronic Signature(s) Signed: 11/21/2020 8:33:59 AM By: Carlene Coria RN Entered By: Carlene Coria on 11/11/2020 15:17:05 Ziemann, [name] ([id number]) -------------------------------------------------------------------------------- Clinic Level of Care Assessment Details Patient Name: [name], [name] Date of Service: 11/11/2020 2:30 PM Medical Record Number: [id number] Patient Account Number: [id number] Date of Birth/Sex: 1927-11-12 (87 y.o. F) Treating RN: Carlene Coria Primary Care Sanya Kobrin: Viviana Simpler Other Clinician: Referring Mahitha Hickling: Viviana Simpler Treating Terald Jump/Extender: Skipper Cliche in Treatment: 3 Clinic Level of Care Assessment Items TOOL 1 Quantity Score _0  - Use when EandM and Procedure is performed on INITIAL visit 0 ASSESSMENTS - Nursing Assessment / Reassessment _1  - General Physical Exam (combine w/ comprehensive assessment (listed just below) when performed on new 0 pt. evals) _2  - 0 Comprehensive Assessment (HX, ROS,  Risk Assessments, Wounds Hx, etc.) ASSESSMENTS - Wound and Skin Assessment / Reassessment _3  - Dermatologic / Skin Assessment (not related to wound area) 0 ASSESSMENTS - Ostomy and/or Continence Assessment and Care _4  - Incontinence Assessment and Management 0 _5  - 0 Ostomy Care Assessment and Management (repouching, etc.) PROCESS - Coordination of Care _6  - Simple Patient / Family Education for ongoing care 0 _7  - 0 Complex (extensive) Patient / Family Education for ongoing care _8  - 0 Staff obtains Programmer, systems, Records, Test Results / Process Orders _9  - 0 Staff telephones HHA, Nursing Homes / Clarify orders / etc _10  - 0 Routine Transfer to another Facility (non-emergent condition) _11  - 0 Routine Hospital Admission (non-emergent condition) _12  - 0 New Admissions / Biomedical engineer / Ordering NPWT, Apligraf, etc. _13  - 0 Emergency Hospital Admission (emergent condition) PROCESS - Special Needs _14  - Pediatric / Minor Patient Management 0 _15  - 0 Isolation Patient Management _16  - 0 Hearing / Language / Visual special needs _17  - 0 Assessment of Community assistance (transportation, D/C planning, etc.) _18  - 0 Additional assistance / Altered mentation _19  - 0 Support Surface(s) Assessment (bed, cushion, seat, etc.) INTERVENTIONS - Miscellaneous _20  - External ear exam 0 _21  - 0 Patient Transfer (multiple staff / Civil Service fast streamer / Similar devices) _22  - 0 Simple Staple / Suture removal (25 or less) _23  - 0 Complex Staple / Suture removal (26 or more) _24  - 0 Hypo/Hyperglycemic Management (do not check if billed separately) _25  - 0 Ankle / Brachial Index (ABI) - do not check if billed separately Has the patient been seen at the hospital within the last three years: Yes Total Score: 0 Level Of Care: ____ [name] ([id number]) Electronic Signature(s) Signed: 11/21/2020 8:33:59 AM By: Carlene Coria RN Entered By: Carlene Coria on 11/11/2020 15:23:29 [name]  ([id number]) -------------------------------------------------------------------------------- Encounter Discharge Information Details Patient Name: [name], [name] Date of Service: 11/11/2020 2:30  PM Medical Record Number: [id number] Patient Account Number: [id number] Date of Birth/Sex: 05/30/1928 (87 y.o. F) Treating RN: Donnamarie Poag Primary Care Ahlaya Ende: Viviana Simpler Other Clinician: Referring Suhail Peloquin: Viviana Simpler Treating Kohana Amble/Extender: Skipper Cliche in Treatment: 3 Encounter Discharge Information Items Post Procedure Vitals Discharge Condition: Stable Temperature (F): 98.5 Ambulatory Status: Walker Pulse (bpm): 85 Discharge Destination: Home Respiratory Rate (breaths/min): 16 Transportation: Private Auto Blood Pressure (mmHg): 139/61 Accompanied By: daughter in law Schedule Follow-up Appointment: Yes Clinical Summary of Care: Electronic Signature(s) Signed: 11/11/2020 4:26:14 PM By: Donnamarie Poag Entered By: Donnamarie Poag on 11/11/2020 15:36:11 Wh[place]hill, [name] ([id number]) -------------------------------------------------------------------------------- Lower Extremity Assessment Details Patient Name: [name], [name] Date of Service: 11/11/2020 2:30 PM Medical Record Number: [id number] Patient Account Number: [id number] Date of Birth/Sex: 05/06/1928 (87 y.o. F) Treating RN: Carlene Coria Primary Care Guy Toney: Viviana Simpler Other Clinician: Referring Garnell Begeman: Viviana Simpler Treating Ha Shannahan/Extender: Skipper Cliche in Treatment: 3 Electronic Signature(s) Signed: 11/11/2020 3:59:56 PM By: Jeanine Luz Signed: 11/21/2020 8:33:59 AM By: Carlene Coria RN Entered By: Jeanine Luz on 11/11/2020 14:45:54 Perret, [name] ([id number]) -------------------------------------------------------------------------------- Multi Wound Chart Details Patient Name: [name], [name] Date of Service: 11/11/2020 2:30 PM Medical Record Number: [id number] Patient Account Number:  [id number] Date of Birth/Sex: Oct 19, 1927 (87 y.o. F) Treating RN: Carlene Coria Primary Care Dejay Kronk: Viviana Simpler Other Clinician: Referring Felica Chargois: Viviana Simpler Treating Olamae Ferrara/Extender: Skipper Cliche in Treatment: 3 Vital Signs Height(in): 42 Pulse(bpm): 80 Weight(lbs): 68 Blood Pressure(mmHg): 139/61 Body Mass Index(BMI): 12 Temperature(F): 98.5 Respiratory Rate(breaths/min): 18 Photos: Wound Location: Right, Lateral Malleolus Left Calcaneus Left Forearm Wounding Event: Gradually Appeared Gradually Appeared Skin Tear/Laceration Primary Etiology: Pressure Ulcer Pressure Ulcer Skin Tear Comorbid History: Arrhythmia, Coronary Artery Arrhythmia, Coronary Artery Arrhythmia, Coronary Artery Disease, Hypertension, Peripheral Disease, Hypertension, Peripheral Disease, Hypertension, Peripheral Arterial Disease, History of pressure Arterial Disease, History of pressure Arterial Disease, History of pressure wounds, Osteoarthritis wounds, Osteoarthritis wounds, Osteoarthritis Date Acquired: 10/22/2014 07/09/2020 10/18/2020 Weeks of Treatment: _0 Wound Status: Open Open Open Measurements L x W x D (cm) 0.8x0.9x0.1 1.3x1.2x0.1 0.1x0.1x0.1 Area (cm) : 0.565 1.225 0.008 Volume (cm) : 0.057 0.123 0.001 % Reduction in Area: 28.00% 20.00% 99.20% % Reduction in Volume: 27.80% 19.60% 98.90% Classification: Category/Stage III Unstageable/Unclassified Full Thickness Without Exposed Support Structures Exudate Amount: Medium Small None Present Exudate Type: Serous Serous N/A Exudate Color: amber amber N/A Wound Margin: Flat and Intact N/A N/A Granulation Amount: Medium (34-66%) Small (1-33%) None Present (0%) Granulation Quality: Pink Pink N/A Necrotic Amount: Medium (34-66%) Large (67-100%) None Present (0%) Exposed Structures: Fat Layer (Subcutaneous Tissue): Fat Layer (Subcutaneous Tissue): Fascia: No Yes Yes Fat Layer (Subcutaneous Tissue): Fascia: No Fascia: No No Tendon:  No Tendon: No Tendon: No Muscle: No Muscle: No Muscle: No Joint: No Joint: No Joint: No Bone: No Bone: No Bone: No Epithelialization: None None Medium (34-66%) Treatment Notes Electronic Signature(s) Signed: 11/21/2020 8:33:59 AM By: Carlene Coria RN Entered By: Carlene Coria on 11/11/2020 15:15:50 Neeb, [name] ([id number]) -------------------------------------------------------------------------------- Multi-Disciplinary Care Plan Details Patient Name: [name], [name] Date of Service: 11/11/2020 2:30 PM Medical Record Number: [id number] Patient Account Number: [id number] Date of Birth/Sex: 09/16/1927 (87 y.o. F) Treating RN: Carlene Coria Primary Care Thea Holshouser: Viviana Simpler Other Clinician: Referring Emmanuell Kantz: Viviana Simpler Treating Amarri Satterly/Extender: Skipper Cliche in Treatment: 3 Active Inactive Abuse / Safety / Falls / Self Care Management Nursing Diagnoses: Potential for injury related to falls Goals: Patient will not experience any injury related to falls Date Initiated: 10/21/2020 Target  Resolution Date: 11/20/2020 Goal Status: Active Interventions: Assess Activities of Daily Living upon admission and as needed Assess fall risk on admission and as needed Assess: immobility, friction, shearing, incontinence upon admission and as needed Assess impairment of mobility on admission and as needed per policy Assess personal safety and home safety (as indicated) on admission and as needed Assess self care needs on admission and as needed Notes: Wound/Skin Impairment Nursing Diagnoses: Impaired tissue integrity Goals: Patient/caregiver will verbalize understanding of skin care regimen Date Initiated: 10/21/2020 Target Resolution Date: 11/20/2020 Goal Status: Active Ulcer/skin breakdown will have a volume reduction of 30% by week 4 Date Initiated: 10/21/2020 Target Resolution Date: 11/20/2020 Goal Status: Active Ulcer/skin breakdown will have a volume reduction of  50% by week 8 Date Initiated: 10/21/2020 Target Resolution Date: 12/21/2020 Goal Status: Active Ulcer/skin breakdown will have a volume reduction of 80% by week 12 Date Initiated: 10/21/2020 Target Resolution Date: 01/20/2021 Goal Status: Active Ulcer/skin breakdown will heal within 14 weeks Date Initiated: 10/21/2020 Target Resolution Date: 02/20/2021 Goal Status: Active Interventions: Assess patient/caregiver ability to obtain necessary supplies Assess patient/caregiver ability to perform ulcer/skin care regimen upon admission and as needed Assess ulceration(s) every visit Notes: Electronic Signature(s) Signed: 11/21/2020 8:33:59 AM By: Carlene Coria RN Entered By: Carlene Coria on 11/11/2020 15:15:42 Gatchel, [name] ([id number]) [name], [name] ([id number]) -------------------------------------------------------------------------------- Pain Assessment Details Patient Name: [name], [name] Date of Service: 11/11/2020 2:30 PM Medical Record Number: [id number] Patient Account Number: [id number] Date of Birth/Sex: 07/07/1928 (87 y.o. F) Treating RN: Carlene Coria Primary Care Lachanda Buczek: Viviana Simpler Other Clinician: Referring Aviva Wolfer: Viviana Simpler Treating Dealie Koelzer/Extender: Skipper Cliche in Treatment: 3 Active Problems Location of Pain Severity and Description of Pain Patient Has Paino No Site Locations Rate the pain. Current Pain Level: 0 Pain Management and Medication Current Pain Management: Electronic Signature(s) Signed: 11/11/2020 3:59:56 PM By: Jeanine Luz Signed: 11/21/2020 8:33:59 AM By: Carlene Coria RN Entered By: Jeanine Luz on 11/11/2020 14:34:30 Mcphearson, [name] ([id number]) -------------------------------------------------------------------------------- Patient/Caregiver Education Details Patient Name: [name], [name] Date of Service: 11/11/2020 2:30 PM Medical Record Number: [id number] Patient Account Number: [id number] Date of Birth/Gender: 1927/12/12  (87 y.o. F) Treating RN: Carlene Coria Primary Care Physician: Viviana Simpler Other Clinician: Referring Physician: Viviana Simpler Treating Physician/Extender: Skipper Cliche in Treatment: 3 Education Assessment Education Provided To: Patient Education Topics Provided Wound/Skin Impairment: Methods: Explain/Verbal Responses: State content correctly Electronic Signature(s) Signed: 11/21/2020 8:33:59 AM By: Carlene Coria RN Entered By: Carlene Coria on 11/11/2020 15:23:47 Gomer, [name] ([id number]) -------------------------------------------------------------------------------- Wound Assessment Details Patient Name: [name], [name] Date of Service: 11/11/2020 2:30 PM Medical Record Number: [id number] Patient Account Number: [id number] Date of Birth/Sex: [birth year] (87 y.o. F) Treating RN: Carlene Coria Primary Care Coreyon Nicotra: Viviana Simpler Other Clinician: Referring Melodi Happel: Viviana Simpler Treating Jeannelle Wiens/Extender: Skipper Cliche in Treatment: 3 Wound Status Wound Number: 1 Primary Pressure Ulcer Etiology: Wound Location: Right, Lateral Malleolus Wound Open Wounding Event: Gradually Appeared Status: Date Acquired: 10/22/2014 Comorbid Arrhythmia, Coronary Artery Disease, Hypertension, Weeks Of Treatment: 3 History: Peripheral Arterial Disease, History of pressure wounds, Clustered Wound: No Osteoarthritis Photos Wound Measurements Length: (cm) 0.8 Width: (cm) 0.9 Depth: (cm) 0.1 Area: (cm) 0.565 Volume: (cm) 0.057 % Reduction in Area: 28% % Reduction in Volume: 27.8% Epithelialization: None Tunneling: No Undermining: No Wound Description Classification: Category/Stage III Wound Margin: Flat and Intact Exudate Amount: Medium Exudate Type: Serous Exudate Color: amber Foul Odor After Cleansing: No Slough/Fibrino Yes Wound Bed Granulation Amount: Medium (34-66%) Exposed Structure  Granulation Quality: Pink Fascia Exposed: No Necrotic Amount: Medium  (34-66%) Fat Layer (Subcutaneous Tissue) Exposed: Yes Necrotic Quality: Adherent Slough Tendon Exposed: No Muscle Exposed: No Joint Exposed: No Bone Exposed: No Treatment Notes Wound #1 (Malleolus) Wound Laterality: Right, Lateral Cleanser Byram Ancillary Kit - 15 Day Supply Discharge Instruction: Use supplies as instructed; Kit contains: (15) Saline Bullets; (15) 3x3 Gauze; 15 pr Gloves Normal Saline KHRISTINE, VERNO ([id number]) Discharge Instruction: Wash your hands with soap and water. Remove old dressing, discard into plastic bag and place into trash. Cleanse the wound with Normal Saline prior to applying a clean dressing using gauze sponges, not tissues or cotton balls. Do not scrub or use excessive force. Pat dry using gauze sponges, not tissue or cotton balls. Peri-Wound Care Topical Primary Dressing Santyl Collagenase Ointment, 30 (gm), tube Secondary Dressing Coverlet Latex-Free Fabric Adhesive Dressings Discharge Instruction: 1.5 x 2 Gauze Discharge Instruction: over santyl gauze moistened with saline Secured With Compression Wrap Compression Stockings Add-Ons Electronic Signature(s) Signed: 11/11/2020 3:59:56 PM By: Jeanine Luz Signed: 11/21/2020 8:33:59 AM By: Carlene Coria RN Entered By: Jeanine Luz on 11/11/2020 14:44:26 Litke, [name] ([id number]) -------------------------------------------------------------------------------- Wound Assessment Details Patient Name: [name], [name] Date of Service: 11/11/2020 2:30 PM Medical Record Number: [id number] Patient Account Number: [id number] Date of Birth/Sex: 12-Jul-1927 (87 y.o. F) Treating RN: Carlene Coria Primary Care Zorana Brockwell: Viviana Simpler Other Clinician: Referring Audreana Hancox: Viviana Simpler Treating Avram Danielson/Extender: Skipper Cliche in Treatment: 3 Wound Status Wound Number: 2 Primary Pressure Ulcer Etiology: Wound Location: Left Calcaneus Wound Open Wounding Event: Gradually  Appeared Status: Date Acquired: 07/09/2020 Comorbid Arrhythmia, Coronary Artery Disease, Hypertension, Weeks Of Treatment: 3 History: Peripheral Arterial Disease, History of pressure wounds, Clustered Wound: No Osteoarthritis Photos Wound Measurements Length: (cm) 1.3 Width: (cm) 1.2 Depth: (cm) 0.1 Area: (cm) 1.225 Volume: (cm) 0.123 % Reduction in Area: 20% % Reduction in Volume: 19.6% Epithelialization: None Tunneling: No Undermining: No Wound Description Classification: Unstageable/Unclassified Exudate Amount: Small Exudate Type: Serous Exudate Color: amber Foul Odor After Cleansing: No Slough/Fibrino Yes Wound Bed Granulation Amount: Small (1-33%) Exposed Structure Granulation Quality: Pink Fascia Exposed: No Necrotic Amount: Large (67-100%) Fat Layer (Subcutaneous Tissue) Exposed: Yes Necrotic Quality: Adherent Slough Tendon Exposed: No Muscle Exposed: No Joint Exposed: No Bone Exposed: No Treatment Notes Wound #2 (Calcaneus) Wound Laterality: Left Cleanser Byram Ancillary Kit - 15 Day Supply Discharge Instruction: Use supplies as instructed; Kit contains: (15) Saline Bullets; (15) 3x3 Gauze; 15 pr Gloves Normal Saline ANGELIE, KRAM ([id number]) Discharge Instruction: Wash your hands with soap and water. Remove old dressing, discard into plastic bag and place into trash. Cleanse the wound with Normal Saline prior to applying a clean dressing using gauze sponges, not tissues or cotton balls. Do not scrub or use excessive force. Pat dry using gauze sponges, not tissue or cotton balls. Peri-Wound Care Topical Primary Dressing Gauze Discharge Instruction: over santyl gauze moistened with saline Santyl Collagenase Ointment, 30 (gm), tube Secondary Dressing Bordered Gauze Sterile-HBD 4x4 (in/in) Discharge Instruction: Cover wound with Bordered Guaze Sterile as directed Secured With Compression Wrap Compression Stockings Add-Ons Electronic  Signature(s) Signed: 11/11/2020 3:59:56 PM By: Jeanine Luz Signed: 11/21/2020 8:33:59 AM By: Carlene Coria RN Entered By: Jeanine Luz on 11/11/2020 14:45:15 Kemppainen, [name] ([id number]) -------------------------------------------------------------------------------- Wound Assessment Details Patient Name: [name], [name] Date of Service: 11/11/2020 2:30 PM Medical Record Number: [id number] Patient Account Number: [id number] Date of Birth/Sex: [birth year] (87 y.o. F) Treating RN: Carlene Coria Primary Care Tyrika Newman: Viviana Simpler Other Clinician:  Referring Naylin Burkle: Viviana Simpler Treating Axxel Gude/Extender: Skipper Cliche in Treatment: 3 Wound Status Wound Number: 3 Primary Skin Tear Etiology: Wound Location: Left Forearm Wound Healed - Epithelialized Wounding Event: Skin Tear/Laceration Status: Date Acquired: 10/18/2020 Comorbid Arrhythmia, Coronary Artery Disease, Hypertension, Weeks Of Treatment: 3 History: Peripheral Arterial Disease, History of pressure wounds, Clustered Wound: No Osteoarthritis Photos Wound Measurements Length: (cm) 0 Width: (cm) 0 Depth: (cm) 0 Area: (cm) 0 Volume: (cm) 0 % Reduction in Area: 100% % Reduction in Volume: 100% Epithelialization: Large (67-100%) Tunneling: No Undermining: No Wound Description Classification: Full Thickness Without Exposed Support Structures Exudate Amount: None Present Foul Odor After Cleansing: No Slough/Fibrino No Wound Bed Granulation Amount: None Present (0%) Exposed Structure Necrotic Amount: None Present (0%) Fascia Exposed: No Fat Layer (Subcutaneous Tissue) Exposed: No Tendon Exposed: No Muscle Exposed: No Joint Exposed: No Bone Exposed: No Treatment Notes Wound #3 (Forearm) Wound Laterality: Left Cleanser Peri-Wound Care Topical Primary Dressing DARCIA, LAMPI ([id number]) Secondary Dressing Secured With Compression Wrap Compression Stockings Add-Ons Electronic  Signature(s) Signed: 11/21/2020 8:33:59 AM By: Carlene Coria RN Entered By: Carlene Coria on 11/11/2020 15:22:27 [name] ([id number]) -------------------------------------------------------------------------------- Falls Details Patient Name: [name] Date of Service: 11/11/2020 2:30 PM Medical Record Number: [id number] Patient Account Number: [id number] Date of Birth/Sex: [birth year] (86 y.o. F) Treating RN: Carlene Coria Primary Care Rosaelena Kemnitz: Viviana Simpler Other Clinician: Referring Locklan Canoy: Viviana Simpler Treating Hassan Blackshire/Extender: Skipper Cliche in Treatment: 3 Vital Signs Time Taken: 14:30 Temperature (F): 98.5 Height (in): 66 Pulse (bpm): 80 Weight (lbs): 76 Respiratory Rate (breaths/min): 18 Body Mass Index (BMI): 12.3 Blood Pressure (mmHg): 139/61 Reference Range: 80 - 120 mg / dl Electronic Signature(s) Signed: 11/11/2020 3:59:56 PM By: Jeanine Luz Entered By: Jeanine Luz on 11/11/2020 14:34:24

## 2020-11-15 ENCOUNTER — Telehealth: Payer: Self-pay

## 2020-11-15 NOTE — Telephone Encounter (Signed)
Completed on [employer]. "[facility] has received your information, and the request will be reviewed. You may close this dialog, return to your dashboard, and perform other tasks.  You will receive an electronic determination in [employer]. You can see the latest determination by locating this request on your dashboard or by reopening this request. You will also receive a faxed copy of the determination. If you have any questions please contact [facility] at [phone].  If you need assistance, please chat with [employer] or call us at [phone]"

## 2020-11-15 NOTE — Telephone Encounter (Signed)
Spoke to [employer]. They were asking about the increased quantity. I advised them she gets 90 for 30 days. She said they are running it as 90 for 8 days. I called [employer] and spoke to [place] and advised him to correct it in the system. He said they paid for the medication out of pocket on 11-08-20. I called [name] to let her know what had happened. She said they made her pay $40 for the rx and they even told the pharmacy it was for 30 days and they never called to verify it. When the refill comes up next month, we will correct the rx to say 3 times daily as needed #90 and she should not have any other issues.

## 2020-11-15 NOTE — Telephone Encounter (Signed)
Some one from [facility] (person did not leave a name that left v/m) left v/m that needed cb on PA request (name of med not left) this note under visit info has Morph[facility] PA. Request cb as urgent matter using case # [id number].

## 2020-11-25 ENCOUNTER — Encounter: Payer: PPO | Admitting: Physician Assistant

## 2020-11-25 ENCOUNTER — Other Ambulatory Visit: Payer: Self-pay

## 2020-11-25 DIAGNOSIS — L89623 Pressure ulcer of left heel, stage 3: Secondary | ICD-10-CM | POA: Diagnosis not present

## 2020-11-25 DIAGNOSIS — L89513 Pressure ulcer of right ankle, stage 3: Secondary | ICD-10-CM | POA: Diagnosis not present

## 2020-11-25 DIAGNOSIS — L89153 Pressure ulcer of sacral region, stage 3: Secondary | ICD-10-CM | POA: Diagnosis not present

## 2020-11-25 NOTE — Progress Notes (Addendum)
[name], [name] ([id number]) Visit Report for 11/25/2020 Chief Complaint Document Details Patient Name: [name], [name]. Date of Service: 11/25/2020 3:00 PM Medical Record Number: [id number] Patient Account Number: [id number] Date of Birth/Sex: 05-23-1928 (87 y.o. F) Treating RN: [name] Primary Care Provider: Viviana Downs Other Clinician: Referring Provider: Viviana Downs Treating Provider/Extender: [name] in Treatment: 5 Information Obtained from: Patient Chief Complaint Pressure ulcer right ankle and left heel and left forearm skin tear Electronic Signature(s) Signed: 11/25/2020 2:43:08 PM By: Worthy Keeler PA-C Entered By: Worthy Downs on 11/25/2020 14:43:08 [name], [name] ([id number]) -------------------------------------------------------------------------------- Debridement Details Patient Name: [name] Date of Service: 11/25/2020 3:00 PM Medical Record Number: [id number] Patient Account Number: [id number] Date of Birth/Sex: [birth year] (87 y.o. F) Treating RN: [name] Primary Care Provider: Viviana Downs Other Clinician: Referring Provider: Viviana Downs Treating Provider/Extender: [name] in Treatment: 5 Debridement Performed for Wound #1 Right,Lateral Malleolus Assessment: Performed By: Physician [name]., PA-C Debridement Type: Debridement Level of Consciousness (Pre- Awake and Alert procedure): Pre-procedure Verification/Time Out Yes - 16:16 Taken: Start Time: 16:16 Pain Control: Lidocaine 4% Topical Solution Total Area Debrided (L x W): 0.9 (cm) x 1.2 (cm) = 1.08 (cm) Tissue and other material Viable, Non-Viable, Slough, Subcutaneous, Skin: Dermis , Skin: Epidermis, Slough debrided: Level: Skin/Subcutaneous Tissue Debridement Description: Excisional Instrument: Curette Bleeding: Moderate Hemostasis Achieved: Pressure End Time: 16:20 Procedural Pain: 0 Post Procedural Pain: 0 Response to Treatment:  Procedure was tolerated well Level of Consciousness (Post- Awake and Alert procedure): Post Debridement Measurements of Total Wound Length: (cm) 0.9 Stage: Category/Stage III Width: (cm) 1.2 Depth: (cm) 0.2 Volume: (cm) 0.17 Character of Wound/Ulcer Post Debridement: Improved Post Procedure Diagnosis Same as Pre-procedure Electronic Signature(s) Signed: 11/25/2020 4:32:16 PM By: Worthy Keeler PA-C Signed: 11/28/2020 2:18:56 PM By: Elizabeth Coria RN Entered By: [name] on 11/25/2020 16:20:13 [name], [name] ([id number]) -------------------------------------------------------------------------------- Debridement Details Patient Name: [name] Date of Service: 11/25/2020 3:00 PM Medical Record Number: [id number] Patient Account Number: [id number] Date of Birth/Sex: February 05, 1928 (87 y.o. F) Treating RN: [name] Primary Care Provider: Viviana Downs Other Clinician: Referring Provider: Viviana Downs Treating Provider/Extender: [name] in Treatment: 5 Debridement Performed for Wound #2 Left Calcaneus Assessment: Performed By: Physician [name]., PA-C Debridement Type: Debridement Level of Consciousness (Pre- Awake and Alert procedure): Pre-procedure Verification/Time Out Yes - 16:16 Taken: Start Time: 16:16 Pain Control: Lidocaine 4% Topical Solution Total Area Debrided (L x W): 1.2 (cm) x 1.3 (cm) = 1.56 (cm) Tissue and other material Viable, Non-Viable, Slough, Subcutaneous, Skin: Dermis , Skin: Epidermis, Slough debrided: Level: Skin/Subcutaneous Tissue Debridement Description: Excisional Instrument: Curette Bleeding: Moderate Hemostasis Achieved: Pressure End Time: 16:20 Procedural Pain: 0 Post Procedural Pain: 0 Response to Treatment: Procedure was tolerated well Level of Consciousness (Post- Awake and Alert procedure): Post Debridement Measurements of Total Wound Length: (cm) 1.2 Stage: Unstageable/Unclassified Width: (cm)  1.3 Depth: (cm) 0.3 Volume: (cm) 0.368 Character of Wound/Ulcer Post Debridement: Improved Post Procedure Diagnosis Same as Pre-procedure Electronic Signature(s) Signed: 11/25/2020 4:32:16 PM By: Worthy Keeler PA-C Signed: 11/28/2020 2:18:56 PM By: Elizabeth Coria RN Entered By: [name] on 11/25/2020 16:20:47 [name], [name] ([id number]) -------------------------------------------------------------------------------- HPI Details Patient Name: [name], [name] Date of Service: 11/25/2020 3:00 PM Medical Record Number: [id number] Patient Account Number: [id number] Date of Birth/Sex: 03-Dec-1927 (87 y.o. F) Treating RN: [name] Primary Care Provider: Viviana Downs Other Clinician: Referring Provider: Viviana Downs Treating Provider/Extender: [name]  in Treatment: 5 History of Present Illness HPI Description: 10/21/2020 upon evaluation today patient presents for initial inspection here in our clinic concerning issues that she has been having quite some time in regard to her ankle and since she has been in the hospital with regard to the heel. The ankle in fact has been 5-6 years at least I am told. With that being said the heel ulcer occurred when she was in the hospital in December for hip surgery when she fractured her hip. She also was in the hospital Tuesday for altered mental status. Really there was nothing that was identified as the cause for this. She did have a. Fortunately there does not appear to be any signs of infection she tells me that she has had she believes arterial studies At [facility] clinic I could not find Those studies at this point. Nonetheless I will continue to look and see what I can find before Then. The patient also has a skin tear on her arm this occurred more recently when she bumped this at home. The patient does have a history of hypertension, coronary artery disease, and peripheral vascular disease stated. 10/28/2020 I was able to find the  patient's chart currently which shows that she did have an arterial study performed in May 2019. This showed that she had a abnormal right toe brachial index and a normal left toe brachial index. She was noncompressible as far as ABIs were concerned. She did appear to have triphasic flow at that time. Unfortunately the wound that is commented on in the report that I printed off and read mentions the same wound on the ankle that we are still dealing with at this point. Unfortunately this has not healed. And its been quite sometime about 3 years now. Fortunately there does not appear to be any signs of active infection systemically at this point. I think that the patient has done well with the Santyl over the past week which is good news. Patient's caregiver which is her daughter-in-law is concerned about the fact that she really does not feel qualified to be able to change the dressings and take care of this issue. There does not appear to be any signs of anything untoward going on at this point. I think she is done a great job applying the [employer] and I think that has done a great job for the patient is for soften up some of the necrotic tissue. With that being said I think the Xeroform on the arm also has done excellent. In general I am very pleased with where we stand. And I told the patient's daughter-in- law as well that I also feel like she has done a great job over the past week taking care of her mother. 11/11/2020 upon evaluation today patient appears to be doing well with regard to her wounds. She has been tolerating the dressing changes without complication her daughters been applying the Santyl which has done a great job. Ho with that being said I think now that we have good arterial study showing we can go ahead and proceed with sharp debridement at this point. 11/25/2020 upon evaluation today patient appears to be doing well with regard to her wounds. The Santyl has really helped to clean  things up and overall she is doing quite excellent at this point. Fortunately there does not appear to be any signs of infection which is great news and in general extremely pleased. I do believe some debridement is in order and  hopefully will be able to get her into a collagen dressing after this. Electronic Signature(s) Signed: 11/25/2020 4:30:16 PM By: Worthy Keeler PA-C Entered By: Worthy Downs on 11/25/2020 16:30:16 [name], [name] ([id number]) -------------------------------------------------------------------------------- Physical Exam Details Patient Name: [name], [name] Date of Service: 11/25/2020 3:00 PM Medical Record Number: [id number] Patient Account Number: [id number] Date of Birth/Sex: 1927/09/25 (87 y.o. F) Treating RN: [name] Primary Care Provider: Viviana Downs Other Clinician: Referring Provider: Viviana Downs Treating Provider/Extender: [name] in Treatment: 5 Constitutional Well-nourished and well-hydrated in no acute distress. Respiratory normal breathing without difficulty. Psychiatric this patient is able to make decisions and demonstrates good insight into disease process. Alert and Oriented x 3. pleasant and cooperative. Notes Upon inspection patient's wound bed showed signs of good granulation epithelization at this point. There does not appear to be any evidence of infection which is great news and overall very pleased. We will perform sharp debridement to clear away some of the necrotic debris she tolerated that without complication postdebridement wound bed appears to be doing much better. Electronic Signature(s) Signed: 11/25/2020 4:30:37 PM By: Worthy Keeler PA-C Entered By: Worthy Downs on 11/25/2020 16:30:37 [name], [name] ([id number]) -------------------------------------------------------------------------------- Physician Orders Details Patient Name: [name], [name] Date of Service: 11/25/2020 3:00 PM Medical Record  Number: [id number] Patient Account Number: [id number] Date of Birth/Sex: Sep 09, 1927 (87 y.o. F) Treating RN: [name] Primary Care Provider: Viviana Downs Other Clinician: Referring Provider: Viviana Downs Treating Provider/Extender: [name] in Treatment: 5 Verbal / Phone Orders: No Diagnosis Coding ICD-10 Coding Code Description L89.513 Pressure ulcer of right ankle, stage 3 L89.623 Pressure ulcer of left heel, stage 3 S51.802A Unspecified open wound of left forearm, initial encounter I10 Essential (primary) hypertension I25.10 Atherosclerotic heart disease of native coronary artery without angina pectoris I73.89 Other specified peripheral vascular diseases Follow-up Appointments o Return Appointment in 2 weeks. Edema Control - Lymphedema / Segmental Compressive Device / Other o Elevate, Exercise Daily and Avoid Standing for Long Periods of Time. o Elevate legs to the level of the heart and pump ankles as often as possible o Elevate leg(s) parallel to the floor when sitting. Off-Loading o Turn and reposition every 2 hours Wound Treatment Wound #1 - Malleolus Wound Laterality: Right, Lateral Cleanser: Byram Ancillary Kit - 15 Day Supply (DME) (Generic) 3 x Per Week/30 Days Discharge Instructions: Use supplies as instructed; Kit contains: (15) Saline Bullets; (15) 3x3 Gauze; 15 pr Gloves Cleanser: Normal Saline (DME) (Generic) 3 x Per Week/30 Days Discharge Instructions: Wash your hands with soap and water. Remove old dressing, discard into plastic bag and place into trash. Cleanse the wound with Normal Saline prior to applying a clean dressing using gauze sponges, not tissues or cotton balls. Do not scrub or use excessive force. Pat dry using gauze sponges, not tissue or cotton balls. Primary Dressing: Prisma 4.34 (in) (DME) (Generic) 3 x Per Week/30 Days Discharge Instructions: Moisten w/normal saline or sterile water; Cover wound as directed. Do not  remove from wound bed. Secondary Dressing: Mepilex Border Flex, 4x4 (in/in) (DME) (Generic) 3 x Per Week/30 Days Discharge Instructions: Apply to wound as directed. Do not cut. Wound #2 - Calcaneus Wound Laterality: Left Cleanser: [employer]ram Ancillary Kit - 15 Day Supply (DME) (Generic) 1 x Per Day/30 Days Discharge Instructions: Use supplies as instructed; Kit contains: (15) Saline Bullets; (15) 3x3 Gauze; 15 pr Gloves Cleanser: Normal Saline (DME) (Generic) 1 x Per Day/30 Days Discharge Instructions: Wash your hands  with soap and water. Remove old dressing, discard into plastic bag and place into trash. Cleanse the wound with Normal Saline prior to applying a clean dressing using gauze sponges, not tissues or cotton balls. Do not scrub or use excessive force. Pat dry using gauze sponges, not tissue or cotton balls. Primary Dressing: Prisma 4.34 (in) (DME) (Generic) 1 x Per Day/30 Days Discharge Instructions: Moisten w/normal saline or sterile water; Cover wound as directed. Do not remove from wound bed. Secondary Dressing: Mepilex Border Flex, 4x4 (in/in) (DME) (Generic) 1 x Per Day/30 Days Discharge Instructions: Apply to wound as directed. Do not cut. SHARLIZE, HOAR ([id number]) Electronic Signature(s) Signed: 11/25/2020 5:32:18 PM By: Worthy Keeler PA-C Signed: 11/28/2020 2:18:56 PM By: Elizabeth Coria RN Previous Signature: 11/25/2020 4:32:16 PM Version By: Worthy Keeler PA-C Entered By: [name] on 11/25/2020 16:47:27 [name], [name] ([id number]) -------------------------------------------------------------------------------- Problem List Details Patient Name: [name], [name] Date of Service: 11/25/2020 3:00 PM Medical Record Number: [id number] Patient Account Number: [id number] Date of Birth/Sex: Jan 23, 1928 (87 y.o. F) Treating RN: [name] Primary Care Provider: Viviana Downs Other Clinician: Referring Provider: Viviana Downs Treating Provider/Extender: [name]  in Treatment: 5 Active Problems ICD-10 Encounter Code Description Active Date MDM Diagnosis L89.513 Pressure ulcer of right ankle, stage 3 10/21/2020 No Yes L89.623 Pressure ulcer of left heel, stage 3 10/21/2020 No Yes S51.802A Unspecified open wound of left forearm, initial encounter 10/21/2020 No Yes I10 Essential (primary) hypertension 10/21/2020 No Yes I25.10 Atherosclerotic heart disease of native coronary artery without angina 10/21/2020 No Yes pectoris I73.89 Other specified peripheral vascular diseases 10/21/2020 No Yes Inactive Problems Resolved Problems Electronic Signature(s) Signed: 11/25/2020 2:43:03 PM By: Worthy Keeler PA-C Entered By: Worthy Downs on 11/25/2020 14:43:03 [name], [name] ([id number]) -------------------------------------------------------------------------------- Progress Note Details Patient Name: [name] Date of Service: 11/25/2020 3:00 PM Medical Record Number: [id number] Patient Account Number: [id number] Date of Birth/Sex: 12/26/1927 (87 y.o. F) Treating RN: [name] Primary Care Provider: Viviana Downs Other Clinician: Referring Provider: Viviana Downs Treating Provider/Extender: [name] in Treatment: 5 Subjective Chief Complaint Information obtained from Patient Pressure ulcer right ankle and left heel and left forearm skin tear History of Present Illness (HPI) 10/21/2020 upon evaluation today patient presents for initial inspection here in our clinic concerning issues that she has been having quite some time in regard to her ankle and since she has been in the hospital with regard to the heel. The ankle in fact has been 5-6 years at least I am told. With that being said the heel ulcer occurred when she was in the hospital in December for hip surgery when she fractured her hip. She also was in the hospital Tuesday for altered mental status. Really there was nothing that was identified as the cause for this. She did have  a. Fortunately there does not appear to be any signs of infection she tells me that she has had she believes arterial studies At [facility] clinic I could not find Those studies at this point. Nonetheless I will continue to look and see what I can find before Then. The patient also has a skin tear on her arm this occurred more recently when she bumped this at home. The patient does have a history of hypertension, coronary artery disease, and peripheral vascular disease stated. 10/28/2020 I was able to find the patient's chart currently which shows that she did have an arterial study performed in May 2019. This showed that she  had a abnormal right toe brachial index and a normal left toe brachial index. She was noncompressible as far as ABIs were concerned. She did appear to have triphasic flow at that time. Unfortunately the wound that is commented on in the report that I printed off and read mentions the same wound on the ankle that we are still dealing with at this point. Unfortunately this has not healed. And its been quite sometime about 3 years now. Fortunately there does not appear to be any signs of active infection systemically at this point. I think that the patient has done well with the Santyl over the past week which is good news. Patient's caregiver which is her daughter-in-law is concerned about the fact that she really does not feel qualified to be able to change the dressings and take care of this issue. There does not appear to be any signs of anything untoward going on at this point. I think she is done a great job applying the [employer] and I think that has done a great job for the patient is for soften up some of the necrotic tissue. With that being said I think the Xeroform on the arm also has done excellent. In general I am very pleased with where we stand. And I told the patient's daughter-in- law as well that I also feel like she has done a great job over the past week taking care of  her mother. 11/11/2020 upon evaluation today patient appears to be doing well with regard to her wounds. She has been tolerating the dressing changes without complication her daughters been applying the Santyl which has done a great job. Ho with that being said I think now that we have good arterial study showing we can go ahead and proceed with sharp debridement at this point. 11/25/2020 upon evaluation today patient appears to be doing well with regard to her wounds. The Santyl has really helped to clean things up and overall she is doing quite excellent at this point. Fortunately there does not appear to be any signs of infection which is great news and in general extremely pleased. I do believe some debridement is in order and hopefully will be able to get her into a collagen dressing after this. Objective Constitutional Well-nourished and well-hydrated in no acute distress. Vitals Time Taken: 3:15 PM, Height: 66 in, Weight: 76 lbs, BMI: 12.3, Temperature: 98.1 F, Pulse: 74 bpm, Respiratory Rate: 16 breaths/min, Blood Pressure: 159/79 mmHg. Respiratory normal breathing without difficulty. Psychiatric this patient is able to make decisions and demonstrates good insight into disease process. Alert and Oriented x 3. pleasant and cooperative. General Notes: Upon inspection patient's wound bed showed signs of good granulation epithelization at this point. There does not appear to be any evidence of infection which is great news and overall very pleased. We will perform sharp debridement to clear away some of the necrotic debris she tolerated that without complication postdebridement wound bed appears to be doing much better. [name], [name] ([id number]) Integumentary (Hair, Skin) Wound #1 status is Open. Original cause of wound was Gradually Appeared. The date acquired was: 10/22/2014. The wound has been in treatment 5 weeks. The wound is located on the Right,Lateral Malleolus. The wound measures  0.9cm length x 1.2cm width x 0.2cm depth; 0.848cm^2 area and 0.17cm^3 volume. There is Fat Layer (Subcutaneous Tissue) exposed. There is no tunneling or undermining noted. There is a medium amount of serous drainage noted. The wound margin is flat and intact. There  is medium (34-66%) pink granulation within the wound bed. There is a medium (34-66%) amount of necrotic tissue within the wound bed including Adherent Slough. Wound #2 status is Open. Original cause of wound was Gradually Appeared. The date acquired was: 07/09/2020. The wound has been in treatment 5 weeks. The wound is located on the Left Calcaneus. The wound measures 1.2cm length x 1.3cm width x 0.3cm depth; 1.225cm^2 area and 0.368cm^3 volume. There is Fat Layer (Subcutaneous Tissue) exposed. There is no tunneling or undermining noted. There is a medium amount of serous drainage noted. There is small (1-33%) pink granulation within the wound bed. There is a large (67-100%) amount of necrotic tissue within the wound bed including Adherent Slough. Assessment Active Problems ICD-10 Pressure ulcer of right ankle, stage 3 Pressure ulcer of left heel, stage 3 Unspecified open wound of left forearm, initial encounter Essential (primary) hypertension Atherosclerotic heart disease of native coronary artery without angina pectoris Other specified peripheral vascular diseases Procedures Wound #1 Pre-procedure diagnosis of Wound #1 is a Pressure Ulcer located on the Right,Lateral Malleolus . There was a Excisional Skin/Subcutaneous Tissue Debridement with a total area of 1.08 sq cm performed by [name]., PA-C. With the following instrument(s): Curette to remove Viable and Non-Viable tissue/material. Material removed includes Subcutaneous Tissue, Slough, Skin: Dermis, and Skin: Epidermis after achieving pain control using Lidocaine 4% Topical Solution. No specimens were taken. A time out was conducted at 16:16, prior to the start of the  procedure. A Moderate amount of bleeding was controlled with Pressure. The procedure was tolerated well with a pain level of 0 throughout and a pain level of 0 following the procedure. Post Debridement Measurements: 0.9cm length x 1.2cm width x 0.2cm depth; 0.17cm^3 volume. Post debridement Stage noted as Category/Stage III. Character of Wound/Ulcer Post Debridement is improved. Post procedure Diagnosis Wound #1: Same as Pre-Procedure Wound #2 Pre-procedure diagnosis of Wound #2 is a Pressure Ulcer located on the Left Calcaneus . There was a Excisional Skin/Subcutaneous Tissue Debridement with a total area of 1.56 sq cm performed by [name]., PA-C. With the following instrument(s): Curette to remove Viable and Non-Viable tissue/material. Material removed includes Subcutaneous Tissue, Slough, Skin: Dermis, and Skin: Epidermis after achieving pain control using Lidocaine 4% Topical Solution. No specimens were taken. A time out was conducted at 16:16, prior to the start of the procedure. A Moderate amount of bleeding was controlled with Pressure. The procedure was tolerated well with a pain level of 0 throughout and a pain level of 0 following the procedure. Post Debridement Measurements: 1.2cm length x 1.3cm width x 0.3cm depth; 0.368cm^3 volume. Post debridement Stage noted as Unstageable/Unclassified. Character of Wound/Ulcer Post Debridement is improved. Post procedure Diagnosis Wound #2: Same as Pre-Procedure Plan Follow-up Appointments: Return Appointment in 2 weeks. Edema Control - Lymphedema / Segmental Compressive Device / Other: Elevate, Exercise Daily and Avoid Standing for Long Periods of Time. Elevate legs to the level of the heart and pump ankles as often as possible Elevate leg(s) parallel to the floor when sitting. Off-Loading: Turn and reposition every 2 hours WOUND #1: - Malleolus Wound Laterality: Right, Lateral ELLEEN, COULIBALY ([id number]) Cleanser: [employer] Ancillary  Kit - 15 Day Supply (DME) (Generic) 3 x Per Week/30 Days Discharge Instructions: Use supplies as instructed; Kit contains: (15) Saline Bullets; (15) 3x3 Gauze; 15 pr Gloves Cleanser: Normal Saline (DME) (Generic) 3 x Per Week/30 Days Discharge Instructions: Wash your hands with soap and water. Remove old dressing, discard into  plastic bag and place into trash. Cleanse the wound with Normal Saline prior to applying a clean dressing using gauze sponges, not tissues or cotton balls. Do not scrub or use excessive force. Pat dry using gauze sponges, not tissue or cotton balls. Primary Dressing: Prisma 4.34 (in) (DME) (Generic) 3 x Per Week/30 Days Discharge Instructions: Moisten w/normal saline or sterile water; Cover wound as directed. Do not remove from wound bed. Secondary Dressing: Bordered Gauze Sterile-HBD 4x4 (in/in) (DME) (Generic) 3 x Per Week/30 Days Discharge Instructions: Cover wound with Bordered Guaze Sterile as directed WOUND #2: - Calcaneus Wound Laterality: Left Cleanser: Byram Ancillary Kit - 15 Day Supply (DME) (Generic) 1 x Per Day/30 Days Discharge Instructions: Use supplies as instructed; Kit contains: (15) Saline Bullets; (15) 3x3 Gauze; 15 pr Gloves Cleanser: Normal Saline (DME) (Generic) 1 x Per Day/30 Days Discharge Instructions: Wash your hands with soap and water. Remove old dressing, discard into plastic bag and place into trash. Cleanse the wound with Normal Saline prior to applying a clean dressing using gauze sponges, not tissues or cotton balls. Do not scrub or use excessive force. Pat dry using gauze sponges, not tissue or cotton balls. Primary Dressing: Prisma 4.34 (in) (DME) (Generic) 1 x Per Day/30 Days Discharge Instructions: Moisten w/normal saline or sterile water; Cover wound as directed. Do not remove from wound bed. Secondary Dressing: Bordered Gauze Sterile-HBD 4x4 (in/in) (DME) (Generic) 1 x Per Day/30 Days Discharge Instructions: Cover wound with Bordered  Guaze Sterile as directed 1. Would recommend currently that we going continue with wound care measures as before and the patient is in agreement the plan this and occludes the use of the large Band-Aid to cover. 2. I am going to discontinue the Santyl and switch to a silver collagen dressing I think this would be a good way to go. 3. Patient will change this 3 times a week on Monday, Wednesday, and Friday. We will see patient back for reevaluation in 2 weeks here in the clinic. If anything worsens or changes patient will contact our office for additional recommendations. Electronic Signature(s) Signed: 11/25/2020 4:31:05 PM By: Worthy Keeler PA-C Entered By: Worthy Downs on 11/25/2020 16:31:05 Kenley, [name] ([id number]) -------------------------------------------------------------------------------- SuperBill Details Patient Name: LASHAWNNA, LAMBRECHT Date of Service: 11/25/2020 Medical Record Number: [id number] Patient Account Number: [id number] Date of Birth/Sex: Dec 21, 1927 (87 y.o. F) Treating RN: [name] Primary Care Provider: Viviana Downs Other Clinician: Referring Provider: Viviana Downs Treating Provider/Extender: [name] in Treatment: 5 Diagnosis Coding ICD-10 Codes Code Description [phone] Pressure ulcer of right ankle, stage 3 L89.623 Pressure ulcer of left heel, stage 3 S51.802A Unspecified open wound of left forearm, initial encounter Daniels (primary) hypertension I25.10 Atherosclerotic heart disease of native coronary artery without angina pectoris I73.89 Other specified peripheral vascular diseases Facility Procedures CPT4 Code: 21308657 Description: 84696 - DEB SUBQ TISSUE 20 SQ CM/< Modifier: Quantity: 1 CPT4 Code: Description: ICD-10 Diagnosis Description L89.513 Pressure ulcer of right ankle, stage 3 L89.623 Pressure ulcer of left heel, stage 3 Modifier: Quantity: Physician Procedures CPT4 Code: 2952841 Description: 11042 - WC PHYS  SUBQ TISS 20 SQ CM Modifier: Quantity: 1 CPT4 Code: Description: ICD-10 Diagnosis Description L89.513 Pressure ulcer of right ankle, stage 3 L89.623 Pressure ulcer of left heel, stage 3 Modifier: Quantity: Electronic Signature(s) Signed: 11/25/2020 4:31:17 PM By: Worthy Keeler PA-C Entered By: Worthy Downs on 11/25/2020 16:31:17

## 2020-11-25 NOTE — Progress Notes (Addendum)
JAMAR, CASAGRANDE ([id number]) Visit Report for 11/25/2020 Arrival Information Details Patient Name: [name], [name] Date of Service: 11/25/2020 3:00 PM Medical Record Number: [id number] Patient Account Number: [id number] Date of Birth/Sex: 10-22-1927 (87 y.o. F) Treating RN: Donnamarie Poag Primary Care Jereline Ticer: Viviana Simpler Other Clinician: Referring Keeven Matty: Viviana Simpler Treating Rulon Abdalla/Extender: Skipper Cliche in Treatment: 5 Visit Information History Since Last Visit Added or deleted any medications: No Patient Arrived: [name] Had a fall or experienced change in No Arrival Time: 15:12 activities of daily living that may affect Accompanied By: daughter in law risk of falls: Transfer Assistance: EasyPivot Patient Lift Hospitalized since last visit: No Patient Identification Verified: Yes Has Dressing in Place as Prescribed: Yes Secondary Verification Process Completed: Yes Pain Present Now: Yes Patient Has Alerts: Yes Patient Alerts: NOT DIABETIC TBI left .77 TBI right .76 Electronic Signature(s) Signed: 11/25/2020 3:58:37 PM By: Donnamarie Poag Entered By: Donnamarie Poag on 11/25/2020 15:12:51 [name] ([id number]) -------------------------------------------------------------------------------- Clinic Level of Care Assessment Details Patient Name: [name] Date of Service: 11/25/2020 3:00 PM Medical Record Number: [id number] Patient Account Number: [id number] Date of Birth/Sex: 17-Nov-1927 (87 y.o. F) Treating RN: Carlene Coria Primary Care Demetrus Pavao: Viviana Simpler Other Clinician: Referring Kathrene Sinopoli: Viviana Simpler Treating Ledarius Leeson/Extender: Skipper Cliche in Treatment: 5 Clinic Level of Care Assessment Items TOOL 1 Quantity Score _0  - Use when EandM and Procedure is performed on INITIAL visit 0 ASSESSMENTS - Nursing Assessment / Reassessment _1  - General Physical Exam (combine w/ comprehensive assessment (listed just below) when performed on  new 0 pt. evals) _2  - 0 Comprehensive Assessment (HX, ROS, Risk Assessments, Wounds Hx, etc.) ASSESSMENTS - Wound and Skin Assessment / Reassessment _3  - Dermatologic / Skin Assessment (not related to wound area) 0 ASSESSMENTS - Ostomy and/or Continence Assessment and Care _4  - Incontinence Assessment and Management 0 _5  - 0 Ostomy Care Assessment and Management (repouching, etc.) PROCESS - Coordination of Care _6  - Simple Patient / Family Education for ongoing care 0 _7  - 0 Complex (extensive) Patient / Family Education for ongoing care _8  - 0 Staff obtains Programmer, systems, Records, Test Results / Process Orders _9  - 0 Staff telephones HHA, Nursing Homes / Clarify orders / etc _10  - 0 Routine Transfer to another Facility (non-emergent condition) _11  - 0 Routine Hospital Admission (non-emergent condition) _12  - 0 New Admissions / Biomedical engineer / Ordering NPWT, Apligraf, etc. _13  - 0 Emergency Hospital Admission (emergent condition) PROCESS - Special Needs _14  - Pediatric / Minor Patient Management 0 _15  - 0 Isolation Patient Management _16  - 0 Hearing / Language / Visual special needs _17  - 0 Assessment of Community assistance (transportation, D/C planning, etc.) _18  - 0 Additional assistance / Altered mentation _19  - 0 Support Surface(s) Assessment (bed, cushion, seat, etc.) INTERVENTIONS - Miscellaneous _20  - External ear exam 0 _21  - 0 Patient Transfer (multiple staff / Civil Service fast streamer / Similar devices) _22  - 0 Simple Staple / Suture removal (25 or less) _23  - 0 Complex Staple / Suture removal (26 or more) _24  - 0 Hypo/Hyperglycemic Management (do not check if billed separately) _25  - 0 Ankle / Brachial Index (ABI) - do not check if billed separately Has the patient been seen at the hospital within the last three years: Yes Total Score: 0 Level Of Care: ____ [name] ([id number]) Electronic Signature(s) Signed: 11/28/2020 2:18:56 PM By: Carlene Coria RN Entered By:  Carlene Coria on 11/25/2020 16:23:46 Meech, Audrie Downs ([id number]) -------------------------------------------------------------------------------- Encounter Discharge Information Details Patient Name:  [name] Date of Service: 11/25/2020 3:00 PM Medical Record Number: [id number] Patient Account Number: [id number] Date of Birth/Sex: May 08, 1928 (87 y.o. F) Treating RN: Dolan Amen Primary Care Kadien Lineman: Viviana Simpler Other Clinician: Referring Cassia Fein: Viviana Simpler Treating Jermiyah Ricotta/Extender: Skipper Cliche in Treatment: 5 Encounter Discharge Information Items Post Procedure Vitals Discharge Condition: Stable Temperature (F): 98.1 Ambulatory Status: Walker Pulse (bpm): 74 Discharge Destination: Home Respiratory Rate (breaths/min): 16 Transportation: Private Auto Blood Pressure (mmHg): 159/79 Accompanied By: daughter Schedule Follow-up Appointment: Yes Clinical Summary of Care: Electronic Signature(s) Signed: 11/25/2020 5:00:05 PM By: Georges Mouse, Minus Breeding RN Entered By: Georges Mouse, Kenia on 11/25/2020 16:53:31 Rom, Audrie Downs ([id number]) -------------------------------------------------------------------------------- Lower Extremity Assessment Details Patient Name: LEANI, MYRON Date of Service: 11/25/2020 3:00 PM Medical Record Number: [id number] Patient Account Number: [id number] Date of Birth/Sex: 1927-11-26 (87 y.o. F) Treating RN: Donnamarie Poag Primary Care Andon Villard: Viviana Simpler Other Clinician: Referring Wonder Donaway: Viviana Simpler Treating Xochil Shanker/Extender: Skipper Cliche in Treatment: 5 Edema Assessment Assessed: Shirlyn Goltz: Yes] Patrice Paradise: Yes] [Left: Edema] [Right: :] Vascular Assessment Pulses: Dorsalis Pedis Palpable: [Left:Yes] [Right:Yes] Electronic Signature(s) Signed: 11/25/2020 3:58:37 PM By: Donnamarie Poag Entered By: Donnamarie Poag on 11/25/2020 15:23:39 Herrle, Audrie Downs  ([id number]) -------------------------------------------------------------------------------- Multi Wound Chart Details Patient Name: ROMANDA, TURRUBIATES Date of Service: 11/25/2020 3:00 PM Medical Record Number: [id number] Patient Account Number: [id number] Date of Birth/Sex: 1928-06-17 (87 y.o. F) Treating RN: Carlene Coria Primary Care Tramond Slinker: Viviana Simpler Other Clinician: Referring Cindy Fullman: Viviana Simpler Treating Jeorge Reister/Extender: Skipper Cliche in Treatment: 5 Vital Signs Height(in): 66 Pulse(bpm): 20 Weight(lbs): 2 Blood Pressure(mmHg): 159/79 Body Mass Index(BMI): 12 Temperature(F): 98.1 Respiratory Rate(breaths/min): 16 Photos: [N/A:N/A] Wound Location: Right, Lateral Malleolus Left Calcaneus N/A Wounding Event: Gradually Appeared Gradually Appeared N/A Primary Etiology: Pressure Ulcer Pressure Ulcer N/A Comorbid History: Arrhythmia, Coronary Artery Arrhythmia, Coronary Artery N/A Disease, Hypertension, Peripheral Disease, Hypertension, Peripheral Arterial Disease, History of pressure Arterial Disease, History of pressure wounds, Osteoarthritis wounds, Osteoarthritis Date Acquired: 10/22/2014 07/09/2020 N/A Weeks of Treatment: 5 5 N/A Wound Status: Open Open N/A Measurements L x W x D (cm) 0.9x1.2x0.2 1.2x1.3x0.3 N/A Area (cm) : 0.848 1.225 N/A Volume (cm) : 0.17 0.368 N/A % Reduction in Area: -8.00% 20.00% N/A % Reduction in Volume: -115.20% -140.50% N/A Classification: Category/Stage III Unstageable/Unclassified N/A Exudate Amount: Medium Medium N/A Exudate Type: Serous Serous N/A Exudate Color: amber amber N/A Wound Margin: Flat and Intact N/A N/A Granulation Amount: Medium (34-66%) Small (1-33%) N/A Granulation Quality: Pink Pink N/A Necrotic Amount: Medium (34-66%) Large (67-100%) N/A Exposed Structures: Fat Layer (Subcutaneous Tissue): Fat Layer (Subcutaneous Tissue): N/A Yes Yes Fascia: No Fascia: No Tendon: No Tendon: No Muscle: No Muscle:  No Joint: No Joint: No Bone: No Bone: No Epithelialization: None None N/A Treatment Notes Electronic Signature(s) Signed: 11/28/2020 2:18:56 PM By: Carlene Coria RN Entered By: Carlene Coria on 11/25/2020 16:18:21 Resurreccion, Audrie Downs ([id number]) -------------------------------------------------------------------------------- [place] Details Patient Name: ZEN, FELLING Date of Service: 11/25/2020 3:00 PM Medical Record Number: [id number] Patient Account Number: [id number] Date of Birth/Sex: 27-Oct-1927 (87 y.o. F) Treating RN: Carlene Coria Primary Care Rexine Gowens: Viviana Simpler Other Clinician: Referring Tanequa Kretz: Viviana Simpler Treating Ceirra Belli/Extender: Skipper Cliche in Treatment: 5 Active Inactive Wound/Skin Impairment Nursing Diagnoses: Impaired tissue integrity Goals: Patient/caregiver will verbalize understanding of skin care regimen Date Initiated: 10/21/2020 Target Resolution Date: 12/21/2020 Goal Status: Active Ulcer/skin breakdown will have a volume reduction of 30% by week 4 Date Initiated: 10/21/2020 Date Inactivated: 11/25/2020 Target Resolution Date: 11/20/2020 Goal Status:  Met Ulcer/skin breakdown will have a volume reduction of 50% by week 8 Date Initiated: 10/21/2020 Target Resolution Date: 12/21/2020 Goal Status: Active Ulcer/skin breakdown will have a volume reduction of 80% by week 12 Date Initiated: 10/21/2020 Target Resolution Date: 01/20/2021 Goal Status: Active Ulcer/skin breakdown will heal within 14 weeks Date Initiated: 10/21/2020 Target Resolution Date: 02/20/2021 Goal Status: Active Interventions: Assess patient/caregiver ability to obtain necessary supplies Assess patient/caregiver ability to perform ulcer/skin care regimen upon admission and as needed Assess ulceration(s) every visit Notes: Electronic Signature(s) Signed: 11/28/2020 2:18:56 PM By: Carlene Coria RN Entered By: Carlene Coria on 11/25/2020 16:18:10 Selk, Audrie Downs ([id number]) -------------------------------------------------------------------------------- Pain Assessment Details Patient Name: [name] Date of Service: 11/25/2020 3:00 PM Medical Record Number: [id number] Patient Account Number: [id number] Date of Birth/Sex: [birth year] (87 y.o. F) Treating RN: Donnamarie Poag Primary Care Thorin Starner: Viviana Simpler Other Clinician: Referring Bresha Hosack: Viviana Simpler Treating Lumina Gitto/Extender: Skipper Cliche in Treatment: 5 Active Problems Location of Pain Severity and Description of Pain Patient Has Paino No Site Locations Rate the pain. Current Pain Level: 0 Pain Management and Medication Current Pain Management: Electronic Signature(s) Signed: 11/25/2020 3:58:37 PM By: Donnamarie Poag Entered By: Donnamarie Poag on 11/25/2020 15:16:02 Landsberg, Audrie Downs ([id number]) -------------------------------------------------------------------------------- Patient/Caregiver Education Details Patient Name: [name] Date of Service: 11/25/2020 3:00 PM Medical Record Number: [id number] Patient Account Number: [id number] Date of Birth/Gender: Dec 18, 1927 (87 y.o. F) Treating RN: Carlene Coria Primary Care Physician: Viviana Simpler Other Clinician: Referring Physician: Viviana Simpler Treating Physician/Extender: Skipper Cliche in Treatment: 5 Education Assessment Education Provided To: Patient Education Topics Provided Wound/Skin Impairment: Methods: Explain/Verbal Responses: State content correctly Electronic Signature(s) Signed: 11/28/2020 2:18:56 PM By: Carlene Coria RN Entered By: Carlene Coria on 11/25/2020 16:24:00 Rinks, Audrie Downs ([id number]) -------------------------------------------------------------------------------- Wound Assessment Details Patient Name: SANSKRITI, GREENLAW Date of Service: 11/25/2020 3:00 PM Medical Record Number: [id number] Patient Account Number: [id number] Date of Birth/Sex: 1927/08/07 (87 y.o. F) Treating RN:  Donnamarie Poag Primary Care Doretta Remmert: Viviana Simpler Other Clinician: Referring Tawsha Terrero: Viviana Simpler Treating Lincoln Kleiner/Extender: Skipper Cliche in Treatment: 5 Wound Status Wound Number: 1 Primary Pressure Ulcer Etiology: Wound Location: Right, Lateral Malleolus Wound Open Wounding Event: Gradually Appeared Status: Date Acquired: 10/22/2014 Comorbid Arrhythmia, Coronary Artery Disease, Hypertension, Weeks Of Treatment: 5 History: Peripheral Arterial Disease, History of pressure wounds, Clustered Wound: No Osteoarthritis Photos Wound Measurements Length: (cm) 0.9 Width: (cm) 1.2 Depth: (cm) 0.2 Area: (cm) 0.848 Volume: (cm) 0.17 % Reduction in Area: -8% % Reduction in Volume: -115.2% Epithelialization: None Tunneling: No Undermining: No Wound Description Classification: Category/Stage III Wound Margin: Flat and Intact Exudate Amount: Medium Exudate Type: Serous Exudate Color: amber Foul Odor After Cleansing: No Slough/Fibrino Yes Wound Bed Granulation Amount: Medium (34-66%) Exposed Structure Granulation Quality: Pink Fascia Exposed: No Necrotic Amount: Medium (34-66%) Fat Layer (Subcutaneous Tissue) Exposed: Yes Necrotic Quality: Adherent Slough Tendon Exposed: No Muscle Exposed: No Joint Exposed: No Bone Exposed: No Treatment Notes Wound #1 (Malleolus) Wound Laterality: Right, Lateral Cleanser Byram Ancillary Kit - 15 Day Supply Discharge Instruction: Use supplies as instructed; Kit contains: (15) Saline Bullets; (15) 3x3 Gauze; 15 pr Gloves Normal Saline AMRI, LIEN ([id number]) Discharge Instruction: Wash your hands with soap and water. Remove old dressing, discard into plastic bag and place into trash. Cleanse the wound with Normal Saline prior to applying a clean dressing using gauze sponges, not tissues or cotton balls. Do not scrub or use excessive force. Pat dry using gauze sponges, not tissue or cotton  balls. Peri-Wound  Care Topical Primary Dressing Prisma 4.34 (in) Discharge Instruction: Moisten w/normal saline or sterile water; Cover wound as directed. Do not remove from wound bed. Secondary Dressing Mepilex Border Flex, 4x4 (in/in) Discharge Instruction: Apply to wound as directed. Do not cut. Secured With Compression Wrap Compression Stockings Environmental education officer) Signed: 11/25/2020 3:58:37 PM By: Donnamarie Poag Entered By: Donnamarie Poag on 11/25/2020 15:21:35 Leclaire, Audrie Downs ([id number]) -------------------------------------------------------------------------------- Wound Assessment Details Patient Name: HARVEY, LINGO Date of Service: 11/25/2020 3:00 PM Medical Record Number: [id number] Patient Account Number: [id number] Date of Birth/Sex: October 01, 1927 (87 y.o. F) Treating RN: Donnamarie Poag Primary Care Milli Woolridge: Viviana Simpler Other Clinician: Referring Iysha Mishkin: Viviana Simpler Treating Haliey Romberg/Extender: Skipper Cliche in Treatment: 5 Wound Status Wound Number: 2 Primary Pressure Ulcer Etiology: Wound Location: Left Calcaneus Wound Open Wounding Event: Gradually Appeared Status: Date Acquired: 07/09/2020 Comorbid Arrhythmia, Coronary Artery Disease, Hypertension, Weeks Of Treatment: 5 History: Peripheral Arterial Disease, History of pressure wounds, Clustered Wound: No Osteoarthritis Photos Wound Measurements Length: (cm) 1.2 Width: (cm) 1.3 Depth: (cm) 0.3 Area: (cm) 1.225 Volume: (cm) 0.368 % Reduction in Area: 20% % Reduction in Volume: -140.5% Epithelialization: None Tunneling: No Undermining: No Wound Description Classification: Unstageable/Unclassified Exudate Amount: Medium Exudate Type: Serous Exudate Color: amber Foul Odor After Cleansing: No Slough/Fibrino Yes Wound Bed Granulation Amount: Small (1-33%) Exposed Structure Granulation Quality: Pink Fascia Exposed: No Necrotic Amount: Large (67-100%) Fat Layer (Subcutaneous Tissue) Exposed:  Yes Necrotic Quality: Adherent Slough Tendon Exposed: No Muscle Exposed: No Joint Exposed: No Bone Exposed: No Treatment Notes Wound #2 (Calcaneus) Wound Laterality: Left Cleanser Byram Ancillary Kit - 15 Day Supply Discharge Instruction: Use supplies as instructed; Kit contains: (15) Saline Bullets; (15) 3x3 Gauze; 15 pr Gloves Normal Saline EILIDH, MARCANO ([id number]) Discharge Instruction: Wash your hands with soap and water. Remove old dressing, discard into plastic bag and place into trash. Cleanse the wound with Normal Saline prior to applying a clean dressing using gauze sponges, not tissues or cotton balls. Do not scrub or use excessive force. Pat dry using gauze sponges, not tissue or cotton balls. Peri-Wound Care Topical Primary Dressing Prisma 4.34 (in) Discharge Instruction: Moisten w/normal saline or sterile water; Cover wound as directed. Do not remove from wound bed. Secondary Dressing Mepilex Border Flex, 4x4 (in/in) Discharge Instruction: Apply to wound as directed. Do not cut. Secured With Compression Wrap Compression Stockings Environmental education officer) Signed: 11/25/2020 3:58:37 PM By: Donnamarie Poag Entered By: Donnamarie Poag on 11/25/2020 15:22:16 Landress, Audrie Downs ([id number]) -------------------------------------------------------------------------------- [place] Details Patient Name: [name] Date of Service: 11/25/2020 3:00 PM Medical Record Number: [id number] Patient Account Number: [id number] Date of Birth/Sex: [birth year] (87 y.o. F) Treating RN: Donnamarie Poag Primary Care Shareeka Yim: Viviana Simpler Other Clinician: Referring Hendel Gatliff: Viviana Simpler Treating Jaicee Michelotti/Extender: Skipper Cliche in Treatment: 5 Vital Signs Time Taken: 15:15 Temperature (F): 98.1 Height (in): 66 Pulse (bpm): 74 Weight (lbs): 76 Respiratory Rate (breaths/min): 16 Body Mass Index (BMI): 12.3 Blood Pressure (mmHg): 159/79 Reference Range: 80 - 120 mg /  dl Electronic Signature(s) Signed: 11/25/2020 3:58:37 PM By: Donnamarie Poag Entered ByDonnamarie Poag on 11/25/2020 15:15:54

## 2020-11-28 ENCOUNTER — Other Ambulatory Visit: Payer: Self-pay | Admitting: Internal Medicine

## 2020-11-28 DIAGNOSIS — E11621 Type 2 diabetes mellitus with foot ulcer: Secondary | ICD-10-CM | POA: Diagnosis not present

## 2020-11-29 NOTE — Telephone Encounter (Signed)
Last office visit 09/29/2020 for rehab follow up/3 month narcotic follow up/ears itching.  Last refilled 10/17/2020 for #60 with no refills.  Next Appt: 01/02/2021 for 3 month follow up.

## 2020-12-02 ENCOUNTER — Encounter: Payer: PPO | Admitting: Internal Medicine

## 2020-12-09 ENCOUNTER — Other Ambulatory Visit: Payer: Self-pay | Admitting: Internal Medicine

## 2020-12-09 ENCOUNTER — Ambulatory Visit: Payer: PPO | Admitting: Physician Assistant

## 2020-12-09 NOTE — Telephone Encounter (Signed)
Name of Medication: Morphine Sulfate Name of [facility]acy: [facility] or Written Date and Quantity: 11-08-20 #90 Last Office Visit and Type: 09-29-20 Next Office Visit and Type: 01-02-21 Last Controlled Substance Agreement Date: 09-29-20 Last UDS: 04-27-19

## 2020-12-16 ENCOUNTER — Ambulatory Visit: Payer: PPO | Admitting: Dermatology

## 2020-12-16 ENCOUNTER — Ambulatory Visit: Payer: PPO | Admitting: Physician Assistant

## 2020-12-22 ENCOUNTER — Ambulatory Visit: Payer: PPO | Admitting: Dermatology

## 2020-12-23 ENCOUNTER — Encounter: Payer: PPO | Attending: Physician Assistant | Admitting: Physician Assistant

## 2020-12-23 ENCOUNTER — Other Ambulatory Visit: Payer: Self-pay

## 2020-12-23 DIAGNOSIS — L89623 Pressure ulcer of left heel, stage 3: Secondary | ICD-10-CM | POA: Insufficient documentation

## 2020-12-23 DIAGNOSIS — L97322 Non-pressure chronic ulcer of left ankle with fat layer exposed: Secondary | ICD-10-CM | POA: Diagnosis not present

## 2020-12-23 DIAGNOSIS — I10 Essential (primary) hypertension: Secondary | ICD-10-CM | POA: Diagnosis not present

## 2020-12-23 DIAGNOSIS — L89513 Pressure ulcer of right ankle, stage 3: Secondary | ICD-10-CM | POA: Insufficient documentation

## 2020-12-23 DIAGNOSIS — S51802A Unspecified open wound of left forearm, initial encounter: Secondary | ICD-10-CM | POA: Diagnosis not present

## 2020-12-23 DIAGNOSIS — L97312 Non-pressure chronic ulcer of right ankle with fat layer exposed: Secondary | ICD-10-CM | POA: Diagnosis not present

## 2020-12-23 DIAGNOSIS — I739 Peripheral vascular disease, unspecified: Secondary | ICD-10-CM | POA: Insufficient documentation

## 2020-12-23 NOTE — Progress Notes (Addendum)
HOUDA, BRAU ([id number]) Visit Report for 12/23/2020 Arrival Information Details Patient Name: [name], [name] Date of Service: 12/23/2020 3:45 PM Medical Record Number: [id number] Patient Account Number: [id number] Date of Birth/Sex: [birth year] (87 y.o. F) Treating RN: Carlene Coria Primary Care Wrenly Lauritsen: Viviana Simpler Other Clinician: Referring Benedetta Sundstrom: Viviana Simpler Treating Ezeriah Luty/Extender: Skipper Cliche in Treatment: 9 Visit Information History Since Last Visit Added or deleted any medications: No Patient Arrived: [name] Had a fall or experienced change in No Arrival Time: 15:57 activities of daily living that may affect Accompanied By: daughter in law risk of falls: Transfer Assistance: None Hospitalized since last visit: No Patient Identification Verified: Yes Pain Present Now: No Secondary Verification Process Completed: Yes Patient Has Alerts: Yes Patient Alerts: NOT DIABETIC TBI left .77 TBI right .76 Electronic Signature(s) Signed: 12/27/2020 4:23:36 PM By: Jeanine Luz Entered By: Jeanine Luz on 12/23/2020 15:58:19 Knittle, [name] ([id number]) -------------------------------------------------------------------------------- Clinic Level of Care Assessment Details Patient Name: [name], [name] Date of Service: 12/23/2020 3:45 PM Medical Record Number: [id number] Patient Account Number: [id number] Date of Birth/Sex: [birth year] (87 y.o. F) Treating RN: Carlene Coria Primary Care Anessia [place]: Viviana Simpler Other Clinician: Referring Jameel Quant: Viviana Simpler Treating Krysti Hickling/Extender: Skipper Cliche in Treatment: 9 Clinic Level of Care Assessment Items TOOL 4 Quantity Score X - Use when only an EandM is performed on FOLLOW-UP visit 1 0 ASSESSMENTS - Nursing Assessment / Reassessment '[]'  - Reassessment of Co-morbidities (includes updates in patient status) 0 '[]'  - 0 Reassessment of Adherence to Treatment Plan ASSESSMENTS - Wound and Skin  Assessment / Reassessment X - Simple Wound Assessment / Reassessment - one wound 1 5 '[]'  - 0 Complex Wound Assessment / Reassessment - multiple wounds '[]'  - 0 Dermatologic / Skin Assessment (not related to wound area) ASSESSMENTS - Focused Assessment '[]'  - Circumferential Edema Measurements - multi extremities 0 '[]'  - 0 Nutritional Assessment / Counseling / Intervention '[]'  - 0 Lower Extremity Assessment (monofilament, tuning fork, pulses) '[]'  - 0 Peripheral Arterial Disease Assessment (using hand held doppler) ASSESSMENTS - Ostomy and/or Continence Assessment and Care '[]'  - Incontinence Assessment and Management 0 '[]'  - 0 Ostomy Care Assessment and Management (repouching, etc.) PROCESS - Coordination of Care X - Simple Patient / Family Education for ongoing care 1 15 '[]'  - 0 Complex (extensive) Patient / Family Education for ongoing care '[]'  - 0 Staff obtains Programmer, systems, Records, Test Results / Process Orders '[]'  - 0 Staff telephones HHA, Nursing Homes / Clarify orders / etc '[]'  - 0 Routine Transfer to another Facility (non-emergent condition) '[]'  - 0 Routine Hospital Admission (non-emergent condition) '[]'  - 0 New Admissions / Biomedical engineer / Ordering NPWT, Apligraf, etc. '[]'  - 0 Emergency Hospital Admission (emergent condition) X- 1 10 Simple Discharge Coordination '[]'  - 0 Complex (extensive) Discharge Coordination PROCESS - Special Needs '[]'  - Pediatric / Minor Patient Management 0 '[]'  - 0 Isolation Patient Management '[]'  - 0 Hearing / Language / Visual special needs '[]'  - 0 Assessment of Community assistance (transportation, D/C planning, etc.) '[]'  - 0 Additional assistance / Altered mentation '[]'  - 0 Support Surface(s) Assessment (bed, cushion, seat, etc.) INTERVENTIONS - Wound Cleansing / Measurement ATHENIA, RYS ([id number]) X- 1 5 Simple Wound Cleansing - one wound '[]'  - 0 Complex Wound Cleansing - multiple wounds X- 1 5 Wound Imaging (photographs - any number of  wounds) '[]'  - 0 Wound Tracing (instead of photographs) X- 1 5 Simple Wound Measurement - one wound '[]'  - 0 Complex Wound Measurement -  multiple wounds INTERVENTIONS - Wound Dressings X - Small Wound Dressing one or multiple wounds 1 10 '[]'  - 0 Medium Wound Dressing one or multiple wounds '[]'  - 0 Large Wound Dressing one or multiple wounds '[]'  - 0 Application of Medications - topical '[]'  - 0 Application of Medications - injection INTERVENTIONS - Miscellaneous '[]'  - External ear exam 0 '[]'  - 0 Specimen Collection (cultures, biopsies, blood, body fluids, etc.) '[]'  - 0 Specimen(s) / Culture(s) sent or taken to Lab for analysis '[]'  - 0 Patient Transfer (multiple staff / Civil Service fast streamer / Similar devices) '[]'  - 0 Simple Staple / Suture removal (25 or less) '[]'  - 0 Complex Staple / Suture removal (26 or more) '[]'  - 0 Hypo / Hyperglycemic Management (close monitor of Blood Glucose) '[]'  - 0 Ankle / Brachial Index (ABI) - do not check if billed separately X- 1 5 Vital Signs Has the patient been seen at the hospital within the last three years: Yes Total Score: 60 Level Of Care: New/Established - Level 2 Electronic Signature(s) Signed: 12/28/2020 4:27:57 PM By: Carlene Coria RN Entered By: Carlene Coria on 12/23/2020 16:23:16 [place]uck, [name] ([id number]) -------------------------------------------------------------------------------- Encounter Discharge Information Details Patient Name: MAYBEL, DAMBROSIO Date of Service: 12/23/2020 3:45 PM Medical Record Number: [id number] Patient Account Number: [id number] Date of Birth/Sex: 05-May-1928 (87 y.o. F) Treating RN: Dolan Amen Primary Care Zoeann Mol: Viviana Simpler Other Clinician: Referring Sabriya Yono: Viviana Simpler Treating Tamikia Chowning/Extender: Skipper Cliche in Treatment: 9 Encounter Discharge Information Items Discharge Condition: Stable Ambulatory Status: Walker Discharge Destination: Home Transportation: Private Auto Accompanied By:  daughter Schedule Follow-up Appointment: Yes Clinical Summary of Care: Electronic Signature(s) Signed: 12/23/2020 4:58:24 PM By: Georges Mouse, Minus Breeding RN Entered By: Georges Mouse, Minus Breeding on 12/23/2020 16:58:23 [name] ([id number]) -------------------------------------------------------------------------------- Lower Extremity Assessment Details Patient Name: [name], [name] Date of Service: 12/23/2020 3:45 PM Medical Record Number: [id number] Patient Account Number: [id number] Date of Birth/Sex: 04-11-1928 (87 y.o. F) Treating RN: Carlene Coria Primary Care Esteven Overfelt: Viviana Simpler Other Clinician: Referring Nimah Uphoff: Viviana Simpler Treating Jackline Castilla/Extender: Skipper Cliche in Treatment: 9 Vascular Assessment Pulses: Dorsalis Pedis Palpable: [Left:Yes] [Right:Yes] Electronic Signature(s) Signed: 12/27/2020 4:23:36 PM By: Jeanine Luz Signed: 12/28/2020 4:27:57 PM By: Carlene Coria RN Entered By: Jeanine Luz on 12/23/2020 [place], [name] ([id number]) -------------------------------------------------------------------------------- Multi Wound Chart Details Patient Name: CASSIA, FEIN Date of Service: 12/23/2020 3:45 PM Medical Record Number: [id number] Patient Account Number: [id number] Date of Birth/Sex: July 10, 1927 (87 y.o. F) Treating RN: Carlene Coria Primary Care Jalil Lorusso: Viviana Simpler Other Clinician: Referring Fischer Halley: Viviana Simpler Treating Bassy Fetterly/Extender: Skipper Cliche in Treatment: 9 Vital Signs Height(in): 66 Pulse(bpm): 55 Weight(lbs): 69 Blood Pressure(mmHg): 158/53 Body Mass Index(BMI): 12 Temperature(F): 98.6 Respiratory Rate(breaths/min): 14 Photos: [N/A:N/A] Wound Location: Right, Lateral Malleolus Left Calcaneus N/A Wounding Event: Gradually Appeared Gradually Appeared N/A Primary Etiology: Pressure Ulcer Pressure Ulcer N/A Comorbid History: Arrhythmia, Coronary Artery Arrhythmia, Coronary Artery N/A Disease,  Hypertension, Peripheral Disease, Hypertension, Peripheral Arterial Disease, History of pressure Arterial Disease, History of pressure wounds, Osteoarthritis wounds, Osteoarthritis Date Acquired: 10/22/2014 07/09/2020 N/A Weeks of Treatment: 9 9 N/A Wound Status: Open Open N/A Measurements L x W x D (cm) 0.9x1.2x0.2 1x1x0.3 N/A Area (cm) : 0.848 0.785 N/A Volume (cm) : 0.17 0.236 N/A % Reduction in Area: -8.00% 48.80% N/A % Reduction in Volume: -115.20% -54.20% N/A Classification: Category/Stage III Unstageable/Unclassified N/A Exudate Amount: Medium Medium N/A Exudate Type: Serous Serous N/A Exudate Color: amber amber N/A Wound Margin: Flat and Intact N/A N/A  Granulation Amount: Medium (34-66%) Small (1-33%) N/A Granulation Quality: Pink Red, Pink N/A Necrotic Amount: Medium (34-66%) Large (67-100%) N/A Exposed Structures: Fat Layer (Subcutaneous Tissue): Fat Layer (Subcutaneous Tissue): N/A Yes Yes Fascia: No Fascia: No Tendon: No Tendon: No Muscle: No Muscle: No Joint: No Joint: No Bone: No Bone: No Epithelialization: None None N/A Treatment Notes Electronic Signature(s) Signed: 12/28/2020 4:27:57 PM By: Carlene Coria RN Entered By: Carlene Coria on 12/23/2020 16:22:09 [name] ([id number]) -------------------------------------------------------------------------------- [facility] Details Patient Name: JALON, BLACKWELDER Date of Service: 12/23/2020 3:45 PM Medical Record Number: [id number] Patient Account Number: [id number] Date of Birth/Sex: 1928-01-15 (87 y.o. F) Treating RN: Carlene Coria Primary Care Stelios Kirby: Viviana Simpler Other Clinician: Referring Rakesh Dutko: Viviana Simpler Treating Jhoselin Crume/Extender: Skipper Cliche in Treatment: 9 Active Inactive Wound/Skin Impairment Nursing Diagnoses: Impaired tissue integrity Goals: Patient/caregiver will verbalize understanding of skin care regimen Date Initiated: 10/21/2020 Date Inactivated:  12/23/2020 Target Resolution Date: 12/21/2020 Goal Status: Met Ulcer/skin breakdown will have a volume reduction of 30% by week 4 Date Initiated: 10/21/2020 Date Inactivated: 11/25/2020 Target Resolution Date: 11/20/2020 Goal Status: Met Ulcer/skin breakdown will have a volume reduction of 50% by week 8 Date Initiated: 10/21/2020 Date Inactivated: 12/23/2020 Target Resolution Date: 12/21/2020 Goal Status: Met Ulcer/skin breakdown will have a volume reduction of 80% by week 12 Date Initiated: 10/21/2020 Target Resolution Date: 01/20/2021 Goal Status: Active Ulcer/skin breakdown will heal within 14 weeks Date Initiated: 10/21/2020 Target Resolution Date: 02/20/2021 Goal Status: Active Interventions: Assess patient/caregiver ability to obtain necessary supplies Assess patient/caregiver ability to perform ulcer/skin care regimen upon admission and as needed Assess ulceration(s) every visit Notes: Electronic Signature(s) Signed: 12/28/2020 4:27:57 PM By: Carlene Coria RN Entered By: Carlene Coria on 12/23/2020 16:22:00 [name] ([id number]) -------------------------------------------------------------------------------- Pain Assessment Details Patient Name: [name] Date of Service: 12/23/2020 3:45 PM Medical Record Number: [id number] Patient Account Number: [id number] Date of Birth/Sex: 1928-02-08 (87 y.o. F) Treating RN: Carlene Coria Primary Care Everlynn Sagun: Viviana Simpler Other Clinician: Referring Laela Deviney: Viviana Simpler Treating Cearra Portnoy/Extender: Skipper Cliche in Treatment: 9 Active Problems Location of Pain Severity and Description of Pain Patient Has Paino No Site Locations Rate the pain. Current Pain Level: 0 Pain Management and Medication Current Pain Management: Electronic Signature(s) Signed: 12/27/2020 4:23:36 PM By: Jeanine Luz Signed: 12/28/2020 4:27:57 PM By: Carlene Coria RN Entered By: Jeanine Luz on 12/23/2020 16:01:20 [name]  ([id number]) -------------------------------------------------------------------------------- Patient/Caregiver Education Details Patient Name: SHAKERRA, RED Date of Service: 12/23/2020 3:45 PM Medical Record Number: [id number] Patient Account Number: [id number] Date of Birth/Gender: July 14, 1927 (87 y.o. F) Treating RN: Carlene Coria Primary Care Physician: Viviana Simpler Other Clinician: Referring Physician: Viviana Simpler Treating Physician/Extender: Skipper Cliche in Treatment: 9 Education Assessment Education Provided To: Patient Education Topics Provided Wound/Skin Impairment: Methods: Explain/Verbal Responses: State content correctly Electronic Signature(s) Signed: 12/28/2020 4:27:57 PM By: Carlene Coria RN Entered By: Carlene Coria on 12/23/2020 16:39:51 [place], [name] ([id number]) -------------------------------------------------------------------------------- Wound Assessment Details Patient Name: [name], [name] Date of Service: 12/23/2020 3:45 PM Medical Record Number: [id number] Patient Account Number: [id number] Date of Birth/Sex: [birth year] (87 y.o. F) Treating RN: Carlene Coria Primary Care Jane Birkel: Viviana Simpler Other Clinician: Referring Treyvion Durkee: Viviana Simpler Treating Kalil Woessner/Extender: Skipper Cliche in Treatment: 9 Wound Status Wound Number: 1 Primary Pressure Ulcer Etiology: Wound Location: Right, Lateral Malleolus Wound Open Wounding Event: Gradually Appeared Status: Date Acquired: 10/22/2014 Comorbid Arrhythmia, Coronary Artery Disease, Hypertension, Weeks Of Treatment: 9 History: Peripheral Arterial Disease, History of pressure wounds,  Clustered Wound: No Osteoarthritis Photos Wound Measurements Length: (cm) 0.9 Width: (cm) 1.2 Depth: (cm) 0.2 Area: (cm) 0.848 Volume: (cm) 0.17 % Reduction in Area: -8% % Reduction in Volume: -115.2% Epithelialization: None Tunneling: No Undermining: No Wound Description Classification:  Category/Stage III Wound Margin: Flat and Intact Exudate Amount: Medium Exudate Type: Serous Exudate Color: amber Foul Odor After Cleansing: No Slough/Fibrino Yes Wound Bed Granulation Amount: Medium (34-66%) Exposed Structure Granulation Quality: Pink Fascia Exposed: No Necrotic Amount: Medium (34-66%) Fat Layer (Subcutaneous Tissue) Exposed: Yes Necrotic Quality: Adherent Slough Tendon Exposed: No Muscle Exposed: No Joint Exposed: No Bone Exposed: No Treatment Notes Wound #1 (Malleolus) Wound Laterality: Right, Lateral Cleanser Byram Ancillary Kit - 15 Day Supply Discharge Instruction: Use supplies as instructed; Kit contains: (15) Saline Bullets; (15) 3x3 Gauze; 15 pr Gloves Normal Saline ANNISE, BORAN ([id number]) Discharge Instruction: Wash your hands with soap and water. Remove old dressing, discard into plastic bag and place into trash. Cleanse the wound with Normal Saline prior to applying a clean dressing using gauze sponges, not tissues or cotton balls. Do not scrub or use excessive force. Pat dry using gauze sponges, not tissue or cotton balls. Peri-Wound Care Topical Primary Dressing Prisma 4.34 (in) Discharge Instruction: Moisten w/normal saline or sterile water; Cover wound as directed. Do not remove from wound bed. Secondary Dressing Bordered Gauze Sterile-HBD 4x4 (in/in) Discharge Instruction: Cover wound with Bordered Guaze Sterile as directed Secured With Compression Wrap Compression Stockings Add-Ons Electronic Signature(s) Signed: 12/27/2020 4:23:36 PM By: Jeanine Luz Signed: 12/28/2020 4:27:57 PM By: Carlene Coria RN Entered By: Jeanine Luz on 12/23/2020 16:07:59 W[place]rek, [name] ([id number]) -------------------------------------------------------------------------------- Wound Assessment Details Patient Name: [name], [name] Date of Service: 12/23/2020 3:45 PM Medical Record Number: [id number] Patient Account Number: [id number] Date of  Birth/Sex: Jul 09, 1928 (87 y.o. F) Treating RN: Carlene Coria Primary Care Jourden Delmont: Viviana Simpler Other Clinician: Referring Ila Landowski: Viviana Simpler Treating Selicia Windom/Extender: Skipper Cliche in Treatment: 9 Wound Status Wound Number: 2 Primary Pressure Ulcer Etiology: Wound Location: Left Calcaneus Wound Open Wounding Event: Gradually Appeared Status: Date Acquired: 07/09/2020 Comorbid Arrhythmia, Coronary Artery Disease, Hypertension, Weeks Of Treatment: 9 History: Peripheral Arterial Disease, History of pressure wounds, Clustered Wound: No Osteoarthritis Photos Wound Measurements Length: (cm) 1 Width: (cm) 1 Depth: (cm) 0.3 Area: (cm) 0.785 Volume: (cm) 0.236 % Reduction in Area: 48.8% % Reduction in Volume: -54.2% Epithelialization: None Tunneling: No Undermining: No Wound Description Classification: Unstageable/Unclassified Exudate Amount: Medium Exudate Type: Serous Exudate Color: amber Foul Odor After Cleansing: No Slough/Fibrino Yes Wound Bed Granulation Amount: Small (1-33%) Exposed Structure Granulation Quality: Red, Pink Fascia Exposed: No Necrotic Amount: Large (67-100%) Fat Layer (Subcutaneous Tissue) Exposed: Yes Necrotic Quality: Adherent Slough Tendon Exposed: No Muscle Exposed: No Joint Exposed: No Bone Exposed: No Treatment Notes Wound #2 (Calcaneus) Wound Laterality: Left Cleanser Byram Ancillary Kit - 15 Day Supply Discharge Instruction: Use supplies as instructed; Kit contains: (15) Saline Bullets; (15) 3x3 Gauze; 15 pr Gloves Normal Saline ROBBYE, DEDE ([id number]) Discharge Instruction: Wash your hands with soap and water. Remove old dressing, discard into plastic bag and place into trash. Cleanse the wound with Normal Saline prior to applying a clean dressing using gauze sponges, not tissues or cotton balls. Do not scrub or use excessive force. Pat dry using gauze sponges, not tissue or cotton balls. Peri-Wound  Care Topical Primary Dressing Prisma 4.34 (in) Discharge Instruction: Moisten w/normal saline or sterile water; Cover wound as directed. Do not remove from wound bed. Secondary Dressing Bordered  Gauze Sterile-HBD 4x4 (in/in) Discharge Instruction: Cover wound with Bordered Guaze Sterile as directed Secured With Compression Wrap Compression Stockings Add-Ons Electronic Signature(s) Signed: 12/27/2020 4:23:36 PM By: Jeanine Luz Signed: 12/28/2020 4:27:57 PM By: Carlene Coria RN Entered By: Jeanine Luz on 12/23/2020 16:08:56 Stevick, [name] ([id number]) -------------------------------------------------------------------------------- Vitals Details Patient Name: [name], [name] Date of Service: 12/23/2020 3:45 PM Medical Record Number: [id number] Patient Account Number: [id number] Date of Birth/Sex: [birth year] (87 y.o. F) Treating RN: Carlene Coria Primary Care Alpa Salvo: Viviana Simpler Other Clinician: Referring Miniya Miguez: Viviana Simpler Treating Samyiah Halvorsen/Extender: Skipper Cliche in Treatment: 9 Vital Signs Time Taken: 14:55 Temperature (F): 98.6 Height (in): 66 Pulse (bpm): 55 Weight (lbs): 76 Respiratory Rate (breaths/min): 14 Body Mass Index (BMI): 12.3 Blood Pressure (mmHg): 158/53 Reference Range: 80 - 120 mg / dl Electronic Signature(s) Signed: 12/27/2020 4:23:36 PM By: Jeanine Luz Entered By: Jeanine Luz on 12/23/2020 16:01:12

## 2020-12-23 NOTE — Progress Notes (Addendum)
PAMELA, INTRIERI ([id number]) Visit Report for 12/23/2020 Chief Complaint Document Details Patient Name: [name], [name]. Date of Service: 12/23/2020 3:45 PM Medical Record Number: [id number] Patient Account Number: [id number] Date of Birth/Sex: 02/01/1928 (87 y.o. F) Treating RN: Carlene Coria Primary Care Provider: Viviana Simpler Other Clinician: Referring Provider: Viviana Simpler Treating Provider/Extender: Skipper Cliche in Treatment: 9 Information Obtained from: Patient Chief Complaint Pressure ulcer right ankle and left heel and left forearm skin tear Electronic Signature(s) Signed: 12/23/2020 3:50:52 PM By: Worthy Keeler PA-C Entered By: Worthy Keeler on 12/23/2020 15:50:52 Oliff, [name] ([id number]) -------------------------------------------------------------------------------- HPI Details Patient Name: Elizabeth, Downs Date of Service: 12/23/2020 3:45 PM Medical Record Number: [id number] Patient Account Number: [id number] Date of Birth/Sex: 1928/05/01 (87 y.o. F) Treating RN: Carlene Coria Primary Care Provider: Viviana Simpler Other Clinician: Referring Provider: Viviana Simpler Treating Provider/Extender: Skipper Cliche in Treatment: 9 History of Present Illness HPI Description: 10/21/2020 upon evaluation today patient presents for initial inspection here in our clinic concerning issues that she has been having quite some time in regard to her ankle and since she has been in the hospital with regard to the heel. The ankle in fact has been 5-6 years at least I am told. With that being said the heel ulcer occurred when she was in the hospital in December for hip surgery when she fractured her hip. She also was in the hospital Tuesday for altered mental status. Really there was nothing that was identified as the cause for this. She did have a. Fortunately there does not appear to be any signs of infection she tells me that she has had she believes arterial studies At  [facility] clinic I could not find Those studies at this point. Nonetheless I will continue to look and see what I can find before Then. The patient also has a skin tear on her arm this occurred more recently when she bumped this at home. The patient does have a history of hypertension, coronary artery disease, and peripheral vascular disease stated. 10/28/2020 I was able to find the patient's chart currently which shows that she did have an arterial study performed in May 2019. This showed that she had a abnormal right toe brachial index and a normal left toe brachial index. She was noncompressible as far as ABIs were concerned. She did appear to have triphasic flow at that time. Unfortunately the wound that is commented on in the report that I printed off and read mentions the same wound on the ankle that we are still dealing with at this point. Unfortunately this has not healed. And its been quite sometime about 3 years now. Fortunately there does not appear to be any signs of active infection systemically at this point. I think that the patient has done well with the Santyl over the past week which is good news. Patient's caregiver which is her daughter-in-law is concerned about the fact that she really does not feel qualified to be able to change the dressings and take care of this issue. There does not appear to be any signs of anything untoward going on at this point. I think she is done a great job applying the [employer] and I think that has done a great job for the patient is for soften up some of the necrotic tissue. With that being said I think the Xeroform on the arm also has done excellent. In general I am very pleased with where we stand. And I told the patient's  daughter-in- law as well that I also feel like she has done a great job over the past week taking care of her mother. 11/11/2020 upon evaluation today patient appears to be doing well with regard to her wounds. She has been tolerating  the dressing changes without complication her daughters been applying the Santyl which has done a great job. Ho with that being said I think now that we have good arterial study showing we can go ahead and proceed with sharp debridement at this point. 11/25/2020 upon evaluation today patient appears to be doing well with regard to her wounds. The Santyl has really helped to clean things up and overall she is doing quite excellent at this point. Fortunately there does not appear to be any signs of infection which is great news and in general extremely pleased. I do believe some debridement is in order and hopefully will be able to get her into a collagen dressing after this. 12/23/2020 upon evaluation today patient appears to be doing well with regard to her wound all things considered. Unfortunately it does sound like her daughter decided to let this air out because she felt like it was getting so wet. It sounds like she got border gauze dressings instead of border foam therefore there is really Nothing to catch the excess drainage which I think is the problem they were worried about the smell. Fortunately there does not appear to be any signs of active infection which is great news. Nonetheless I do not think we want to leave this just open to air. Electronic Signature(s) Signed: 12/23/2020 4:34:41 PM By: Worthy Keeler PA-C Entered By: Worthy Keeler on 12/23/2020 16:34:41 Becht, [name] ([id number]) -------------------------------------------------------------------------------- Physical Exam Details Patient Name: Elizabeth, Downs Date of Service: 12/23/2020 3:45 PM Medical Record Number: [id number] Patient Account Number: [id number] Date of Birth/Sex: 1927-09-08 (87 y.o. F) Treating RN: Carlene Coria Primary Care Provider: Viviana Simpler Other Clinician: Referring Provider: Viviana Simpler Treating Provider/Extender: Skipper Cliche in Treatment: 9 Constitutional Well-nourished and  well-hydrated in no acute distress. Respiratory normal breathing without difficulty. Psychiatric this patient is able to make decisions and demonstrates good insight into disease process. Alert and Oriented x 3. pleasant and cooperative. Notes Upon inspection patient's wound bed actually showed signs of good granulation and epithelization at this point. There was minimal slough noted I was able to clean this away with saline and gauze. Fortunately there does not appear to be any evidence of active infection at this time which is great news. Electronic Signature(s) Signed: 12/23/2020 4:34:57 PM By: Worthy Keeler PA-C Entered By: Worthy Keeler on 12/23/2020 16:34:57 Alcott, [name] ([id number]) -------------------------------------------------------------------------------- Physician Orders Details Patient Name: MARCAYLA, BUDGE Date of Service: 12/23/2020 3:45 PM Medical Record Number: [id number] Patient Account Number: [id number] Date of Birth/Sex: [birth year] (87 y.o. F) Treating RN: Carlene Coria Primary Care Provider: Viviana Simpler Other Clinician: Referring Provider: Viviana Simpler Treating Provider/Extender: Skipper Cliche in Treatment: 9 Verbal / Phone Orders: No Diagnosis Coding ICD-10 Coding Code Description L89.513 Pressure ulcer of right ankle, stage 3 L89.623 Pressure ulcer of left heel, stage 3 S51.802A Unspecified open wound of left forearm, initial encounter I10 Essential (primary) hypertension I25.10 Atherosclerotic heart disease of native coronary artery without angina pectoris I73.89 Other specified peripheral vascular diseases Follow-up Appointments o Return Appointment in 1 week. Edema Control - Lymphedema / Segmental Compressive Device / Other o Elevate, Exercise Daily and Avoid Standing for Long Periods of Time. o Elevate legs to  the level of the heart and pump ankles as often as possible o Elevate leg(s) parallel to the floor when  sitting. Off-Loading o Turn and reposition every 2 hours Wound Treatment Wound #1 - Malleolus Wound Laterality: Right, Lateral Cleanser: Byram Ancillary Kit - 15 Day Supply (DME) (Generic) 3 x Per Week/30 Days Discharge Instructions: Use supplies as instructed; Kit contains: (15) Saline Bullets; (15) 3x3 Gauze; 15 pr Gloves Cleanser: Normal Saline (DME) (Generic) 3 x Per Week/30 Days Discharge Instructions: Wash your hands with soap and water. Remove old dressing, discard into plastic bag and place into trash. Cleanse the wound with Normal Saline prior to applying a clean dressing using gauze sponges, not tissues or cotton balls. Do not scrub or use excessive force. Pat dry using gauze sponges, not tissue or cotton balls. Primary Dressing: Prisma 4.34 (in) (Generic) 3 x Per Week/30 Days Discharge Instructions: Moisten w/normal saline or sterile water; Cover wound as directed. Do not remove from wound bed. Secondary Dressing: Bordered Gauze Sterile-HBD 4x4 (in/in) (DME) (Generic) 3 x Per Week/30 Days Discharge Instructions: Cover wound with Bordered Guaze Sterile as directed Wound #2 - Calcaneus Wound Laterality: Left Cleanser: Byram Ancillary Kit - 15 Day Supply (DME) (Generic) 1 x Per Day/30 Days Discharge Instructions: Use supplies as instructed; Kit contains: (15) Saline Bullets; (15) 3x3 Gauze; 15 pr Gloves Cleanser: Normal Saline (DME) (Generic) 1 x Per Day/30 Days Discharge Instructions: Wash your hands with soap and water. Remove old dressing, discard into plastic bag and place into trash. Cleanse the wound with Normal Saline prior to applying a clean dressing using gauze sponges, not tissues or cotton balls. Do not scrub or use excessive force. Pat dry using gauze sponges, not tissue or cotton balls. Primary Dressing: Prisma 4.34 (in) (Generic) 1 x Per Day/30 Days Discharge Instructions: Moisten w/normal saline or sterile water; Cover wound as directed. Do not remove from wound  bed. Secondary Dressing: Bordered Gauze Sterile-HBD 4x4 (in/in) (DME) (Generic) 1 x Per Day/30 Days Discharge Instructions: Cover wound with Bordered Guaze Sterile as directed ZARINA, PE ([id number]) Electronic Signature(s) Signed: 12/23/2020 4:51:48 PM By: Worthy Keeler PA-C Signed: 12/28/2020 4:27:57 PM By: Carlene Coria RN Entered By: Carlene Coria on 12/23/2020 16:40:45 Kirschenbaum, [name] ([id number]) -------------------------------------------------------------------------------- Problem List Details Patient Name: TINIE, MCGLOIN Date of Service: 12/23/2020 3:45 PM Medical Record Number: [id number] Patient Account Number: [id number] Date of Birth/Sex: 1928-03-11 (87 y.o. F) Treating RN: Carlene Coria Primary Care Provider: Viviana Simpler Other Clinician: Referring Provider: Viviana Simpler Treating Provider/Extender: Skipper Cliche in Treatment: 9 Active Problems ICD-10 Encounter Code Description Active Date MDM Diagnosis L89.513 Pressure ulcer of right ankle, stage 3 10/21/2020 No Yes L89.623 Pressure ulcer of left heel, stage 3 10/21/2020 No Yes S51.802A Unspecified open wound of left forearm, initial encounter 10/21/2020 No Yes I10 Essential (primary) hypertension 10/21/2020 No Yes I25.10 Atherosclerotic heart disease of native coronary artery without angina 10/21/2020 No Yes pectoris I73.89 Other specified peripheral vascular diseases 10/21/2020 No Yes Inactive Problems Resolved Problems Electronic Signature(s) Signed: 12/23/2020 3:50:46 PM By: Worthy Keeler PA-C Entered By: Worthy Keeler on 12/23/2020 15:50:46 Kumagai, [name] ([id number]) -------------------------------------------------------------------------------- Progress Note Details Patient Name: [name] Date of Service: 12/23/2020 3:45 PM Medical Record Number: [id number] Patient Account Number: [id number] Date of Birth/Sex: 1927-09-22 (87 y.o. F) Treating RN: Carlene Coria Primary Care Provider:  Viviana Simpler Other Clinician: Referring Provider: Viviana Simpler Treating Provider/Extender: Skipper Cliche in Treatment: 9 Subjective Chief Complaint Information obtained from  Patient Pressure ulcer right ankle and left heel and left forearm skin tear History of Present Illness (HPI) 10/21/2020 upon evaluation today patient presents for initial inspection here in our clinic concerning issues that she has been having quite some time in regard to her ankle and since she has been in the hospital with regard to the heel. The ankle in fact has been 5-6 years at least I am told. With that being said the heel ulcer occurred when she was in the hospital in December for hip surgery when she fractured her hip. She also was in the hospital Tuesday for altered mental status. Really there was nothing that was identified as the cause for this. She did have a. Fortunately there does not appear to be any signs of infection she tells me that she has had she believes arterial studies At [facility] clinic I could not find Those studies at this point. Nonetheless I will continue to look and see what I can find before Then. The patient also has a skin tear on her arm this occurred more recently when she bumped this at home. The patient does have a history of hypertension, coronary artery disease, and peripheral vascular disease stated. 10/28/2020 I was able to find the patient's chart currently which shows that she did have an arterial study performed in May 2019. This showed that she had a abnormal right toe brachial index and a normal left toe brachial index. She was noncompressible as far as ABIs were concerned. She did appear to have triphasic flow at that time. Unfortunately the wound that is commented on in the report that I printed off and read mentions the same wound on the ankle that we are still dealing with at this point. Unfortunately this has not healed. And its been quite sometime about 3 years  now. Fortunately there does not appear to be any signs of active infection systemically at this point. I think that the patient has done well with the Santyl over the past week which is good news. Patient's caregiver which is her daughter-in-law is concerned about the fact that she really does not feel qualified to be able to change the dressings and take care of this issue. There does not appear to be any signs of anything untoward going on at this point. I think she is done a great job applying the [employer] and I think that has done a great job for the patient is for soften up some of the necrotic tissue. With that being said I think the Xeroform on the arm also has done excellent. In general I am very pleased with where we stand. And I told the patient's daughter-in- law as well that I also feel like she has done a great job over the past week taking care of her mother. 11/11/2020 upon evaluation today patient appears to be doing well with regard to her wounds. She has been tolerating the dressing changes without complication her daughters been applying the Santyl which has done a great job. Ho with that being said I think now that we have good arterial study showing we can go ahead and proceed with sharp debridement at this point. 11/25/2020 upon evaluation today patient appears to be doing well with regard to her wounds. The Santyl has really helped to clean things up and overall she is doing quite excellent at this point. Fortunately there does not appear to be any signs of infection which is great news and in general extremely pleased. I  do believe some debridement is in order and hopefully will be able to get her into a collagen dressing after this. 12/23/2020 upon evaluation today patient appears to be doing well with regard to her wound all things considered. Unfortunately it does sound like her daughter decided to let this air out because she felt like it was getting so wet. It sounds like she got  border gauze dressings instead of border foam therefore there is really Nothing to catch the excess drainage which I think is the problem they were worried about the smell. Fortunately there does not appear to be any signs of active infection which is great news. Nonetheless I do not think we want to leave this just open to air. Objective Constitutional Well-nourished and well-hydrated in no acute distress. Vitals Time Taken: 2:55 PM, Height: 66 in, Weight: 76 lbs, BMI: 12.3, Temperature: 98.6 F, Pulse: 55 bpm, Respiratory Rate: 14 breaths/min, Blood Pressure: 158/53 mmHg. Respiratory normal breathing without difficulty. Psychiatric ADESUWA, OSGOOD ([id number]) this patient is able to make decisions and demonstrates good insight into disease process. Alert and Oriented x 3. pleasant and cooperative. General Notes: Upon inspection patient's wound bed actually showed signs of good granulation and epithelization at this point. There was minimal slough noted I was able to clean this away with saline and gauze. Fortunately there does not appear to be any evidence of active infection at this time which is great news. Integumentary (Hair, Skin) Wound #1 status is Open. Original cause of wound was Gradually Appeared. The date acquired was: 10/22/2014. The wound has been in treatment 9 weeks. The wound is located on the Right,Lateral Malleolus. The wound measures 0.9cm length x 1.2cm width x 0.2cm depth; 0.848cm^2 area and 0.17cm^3 volume. There is Fat Layer (Subcutaneous Tissue) exposed. There is no tunneling or undermining noted. There is a medium amount of serous drainage noted. The wound margin is flat and intact. There is medium (34-66%) pink granulation within the wound bed. There is a medium (34-66%) amount of necrotic tissue within the wound bed including Adherent Slough. Wound #2 status is Open. Original cause of wound was Gradually Appeared. The date acquired was: 07/09/2020. The wound has  been in treatment 9 weeks. The wound is located on the Left Calcaneus. The wound measures 1cm length x 1cm width x 0.3cm depth; 0.785cm^2 area and 0.236cm^3 volume. There is Fat Layer (Subcutaneous Tissue) exposed. There is no tunneling or undermining noted. There is a medium amount of serous drainage noted. There is small (1-33%) red, pink granulation within the wound bed. There is a large (67-100%) amount of necrotic tissue within the wound bed including Adherent Slough. Assessment Active Problems ICD-10 Pressure ulcer of right ankle, stage 3 Pressure ulcer of left heel, stage 3 Unspecified open wound of left forearm, initial encounter Essential (primary) hypertension Atherosclerotic heart disease of native coronary artery without angina pectoris Other specified peripheral vascular diseases Plan Follow-up Appointments: Return Appointment in 2 weeks. Edema Control - Lymphedema / Segmental Compressive Device / Other: Elevate, Exercise Daily and Avoid Standing for Long Periods of Time. Elevate legs to the level of the heart and pump ankles as often as possible Elevate leg(s) parallel to the floor when sitting. Off-Loading: Turn and reposition every 2 hours WOUND #1: - Malleolus Wound Laterality: Right, Lateral Cleanser: Byram Ancillary Kit - 15 Day Supply (Generic) 3 x Per Week/30 Days Discharge Instructions: Use supplies as instructed; Kit contains: (15) Saline Bullets; (15) 3x3 Gauze; 15 pr Gloves Cleanser: Normal Saline (  Generic) 3 x Per Week/30 Days Discharge Instructions: Wash your hands with soap and water. Remove old dressing, discard into plastic bag and place into trash. Cleanse the wound with Normal Saline prior to applying a clean dressing using gauze sponges, not tissues or cotton balls. Do not scrub or use excessive force. Pat dry using gauze sponges, not tissue or cotton balls. Primary Dressing: Prisma 4.34 (in) (Generic) 3 x Per Week/30 Days Discharge Instructions:  Moisten w/normal saline or sterile water; Cover wound as directed. Do not remove from wound bed. Secondary Dressing: Mepilex Border Flex, 4x4 (in/in) (Generic) 3 x Per Week/30 Days Discharge Instructions: Apply to wound as directed. Do not cut. WOUND #2: - Calcaneus Wound Laterality: Left Cleanser: Byram Ancillary Kit - 15 Day Supply (Generic) 1 x Per Day/30 Days Discharge Instructions: Use supplies as instructed; Kit contains: (15) Saline Bullets; (15) 3x3 Gauze; 15 pr Gloves Cleanser: Normal Saline (Generic) 1 x Per Day/30 Days Discharge Instructions: Wash your hands with soap and water. Remove old dressing, discard into plastic bag and place into trash. Cleanse the wound with Normal Saline prior to applying a clean dressing using gauze sponges, not tissues or cotton balls. Do not scrub or use excessive force. Pat dry using gauze sponges, not tissue or cotton balls. Primary Dressing: Prisma 4.34 (in) (Generic) 1 x Per Day/30 Days Discharge Instructions: Moisten w/normal saline or sterile water; Cover wound as directed. Do not remove from wound bed. Secondary Dressing: Mepilex Border Flex, 4x4 (in/in) (Generic) 1 x Per Day/30 Days Discharge Instructions: Apply to wound as directed. Do not cut. YOVANNA, COGAN ([id number]) 1. Would suggest that we go ahead and initiate treatment with wound care measures as before. This includes silver collagen which I think is can be the best way to go. 2. I am also can recommend at this time that we have the patient go ahead and use either the border foam dressing or if she has a border gauze use a little bit of gauze 4 x 4 folded into a corner underneath to catch the excess drainage I think this will be better for both padding as well as preventing it from smelling and causing the problems that they were seeing over the past week. 3. I am also can recommend the patient should continue to monitor for any signs of worsening if there is any evidence of infection  they should let me know soon as possible. We will see patient back for reevaluation in 1 week here in the clinic. If anything worsens or changes patient will contact our office for additional recommendations. Electronic Signature(s) Signed: 12/23/2020 4:35:50 PM By: Worthy Keeler PA-C Entered By: Worthy Keeler on 12/23/2020 16:35:49 Pals, [name] ([id number]) -------------------------------------------------------------------------------- SuperBill Details Patient Name: DOT, SPLINTER Date of Service: 12/23/2020 Medical Record Number: [id number] Patient Account Number: [id number] Date of Birth/Sex: 1927/11/29 (87 y.o. F) Treating RN: Carlene Coria Primary Care Provider: Viviana Simpler Other Clinician: Referring Provider: Viviana Simpler Treating Provider/Extender: Skipper Cliche in Treatment: 9 Diagnosis Coding ICD-10 Codes Code Description [phone] Pressure ulcer of right ankle, stage 3 L89.623 Pressure ulcer of left heel, stage 3 S51.802A Unspecified open wound of left forearm, initial encounter I10 Essential (primary) hypertension I25.10 Atherosclerotic heart disease of native coronary artery without angina pectoris I73.89 Other specified peripheral vascular diseases Facility Procedures CPT4 Code: [id number] Description: 81856 - WOUND CARE VISIT-LEV 2 EST PT Modifier: Quantity: 1 Physician Procedures CPT4 Code: [id number] Description: 99213 - WC PHYS LEVEL 3 - EST  PT Modifier: Quantity: 1 CPT4 Code: Description: ICD-10 Diagnosis Description L89.513 Pressure ulcer of right ankle, stage 3 L89.623 Pressure ulcer of left heel, stage 3 S51.802A Unspecified open wound of left forearm, initial encounter I10 Essential (primary) hypertension Modifier: Quantity: Electronic Signature(s) Signed: 12/23/2020 4:36:01 PM By: Worthy Keeler PA-C Entered By: Worthy Keeler on 12/23/2020 16:36:01

## 2020-12-27 DIAGNOSIS — H16231 Neurotrophic keratoconjunctivitis, right eye: Secondary | ICD-10-CM | POA: Diagnosis not present

## 2021-01-02 ENCOUNTER — Ambulatory Visit: Payer: PPO | Admitting: Internal Medicine

## 2021-01-06 ENCOUNTER — Ambulatory Visit: Payer: PPO | Admitting: Internal Medicine

## 2021-01-13 ENCOUNTER — Other Ambulatory Visit: Payer: Self-pay | Admitting: Internal Medicine

## 2021-01-13 ENCOUNTER — Encounter: Payer: PPO | Attending: Physician Assistant | Admitting: Physician Assistant

## 2021-01-13 ENCOUNTER — Other Ambulatory Visit: Payer: Self-pay

## 2021-01-13 DIAGNOSIS — S51802A Unspecified open wound of left forearm, initial encounter: Secondary | ICD-10-CM | POA: Insufficient documentation

## 2021-01-13 DIAGNOSIS — I2510 Atherosclerotic heart disease of native coronary artery without angina pectoris: Secondary | ICD-10-CM | POA: Diagnosis not present

## 2021-01-13 DIAGNOSIS — X58XXXA Exposure to other specified factors, initial encounter: Secondary | ICD-10-CM | POA: Insufficient documentation

## 2021-01-13 DIAGNOSIS — I739 Peripheral vascular disease, unspecified: Secondary | ICD-10-CM | POA: Insufficient documentation

## 2021-01-13 DIAGNOSIS — L89513 Pressure ulcer of right ankle, stage 3: Secondary | ICD-10-CM | POA: Insufficient documentation

## 2021-01-13 DIAGNOSIS — I10 Essential (primary) hypertension: Secondary | ICD-10-CM | POA: Insufficient documentation

## 2021-01-13 DIAGNOSIS — L89312 Pressure ulcer of right buttock, stage 2: Secondary | ICD-10-CM | POA: Insufficient documentation

## 2021-01-13 DIAGNOSIS — L89623 Pressure ulcer of left heel, stage 3: Secondary | ICD-10-CM | POA: Insufficient documentation

## 2021-01-13 DIAGNOSIS — L89519 Pressure ulcer of right ankle, unspecified stage: Secondary | ICD-10-CM | POA: Diagnosis present

## 2021-01-13 DIAGNOSIS — L89613 Pressure ulcer of right heel, stage 3: Secondary | ICD-10-CM | POA: Diagnosis not present

## 2021-01-13 NOTE — Progress Notes (Addendum)
FANTASY, DONALD ([id number]) Visit Report for 01/13/2021 Arrival Information Details Patient Name: [name], [name] Date of Service: 01/13/2021 3:00 PM Medical Record Number: [id number] Patient Account Number: [id number] Date of Birth/Sex: 11-24-1927 (87 y.o. F) Treating RN: Donnamarie Poag Primary Care Emmarie Sannes: Viviana Simpler Other Clinician: Referring Ronique Simerly: Viviana Simpler Treating Teandre Hamre/Extender: Skipper Cliche in Treatment: 12 Visit Information History Since Last Visit Added or deleted any medications: No Patient Arrived: [name] Had a fall or experienced change in No Arrival Time: 14:55 activities of daily living that may affect Accompanied By: daughter in law risk of falls: Transfer Assistance: None Hospitalized since last visit: No Patient Identification Verified: Yes Has Dressing in Place as Prescribed: Yes Secondary Verification Process Completed: Yes Pain Present Now: No Patient Has Alerts: Yes Patient Alerts: NOT DIABETIC TBI left .77 TBI right .76 Electronic Signature(s) Signed: 01/13/2021 3:14:45 PM By: Donnamarie Poag Entered By: Donnamarie Poag on 01/13/2021 14:56:28 [name] ([id number]) -------------------------------------------------------------------------------- Clinic Level of Care Assessment Details Patient Name: [name], [name] Date of Service: 01/13/2021 3:00 PM Medical Record Number: [id number] Patient Account Number: [id number] Date of Birth/Sex: Oct 31, 1927 (87 y.o. F) Treating RN: Donnamarie Poag Primary Care Tiawanna Luchsinger: Viviana Simpler Other Clinician: Referring Janisse Ghan: Viviana Simpler Treating Shewanda Sharpe/Extender: Skipper Cliche in Treatment: 12 Clinic Level of Care Assessment Items TOOL 1 Quantity Score _0  - Use when EandM and Procedure is performed on INITIAL visit 0 ASSESSMENTS - Nursing Assessment / Reassessment _1  - General Physical Exam (combine w/ comprehensive assessment (listed just below) when performed on new 0 pt. evals) _2  -  0 Comprehensive Assessment (HX, ROS, Risk Assessments, Wounds Hx, etc.) ASSESSMENTS - Wound and Skin Assessment / Reassessment _3  - Dermatologic / Skin Assessment (not related to wound area) 0 ASSESSMENTS - Ostomy and/or Continence Assessment and Care _4  - Incontinence Assessment and Management 0 _5  - 0 Ostomy Care Assessment and Management (repouching, etc.) PROCESS - Coordination of Care _6  - Simple Patient / Family Education for ongoing care 0 _7  - 0 Complex (extensive) Patient / Family Education for ongoing care _8  - 0 Staff obtains Programmer, systems, Records, Test Results / Process Orders _9  - 0 Staff telephones HHA, Nursing Homes / Clarify orders / etc _10  - 0 Routine Transfer to another Facility (non-emergent condition) _11  - 0 Routine Hospital Admission (non-emergent condition) _12  - 0 New Admissions / Biomedical engineer / Ordering NPWT, Apligraf, etc. _13  - 0 Emergency Hospital Admission (emergent condition) PROCESS - Special Needs _14  - Pediatric / Minor Patient Management 0 _15  - 0 Isolation Patient Management _16  - 0 Hearing / Language / Visual special needs _17  - 0 Assessment of Community assistance (transportation, D/C planning, etc.) _18  - 0 Additional assistance / Altered mentation _19  - 0 Support Surface(s) Assessment (bed, cushion, seat, etc.) INTERVENTIONS - Miscellaneous _20  - External ear exam 0 _21  - 0 Patient Transfer (multiple staff / Civil Service fast streamer / Similar devices) _22  - 0 Simple Staple / Suture removal (25 or less) _23  - 0 Complex Staple / Suture removal (26 or more) _24  - 0 Hypo/Hyperglycemic Management (do not check if billed separately) _25  - 0 Ankle / Brachial Index (ABI) - do not check if billed separately Has the patient been seen at the hospital within the last three years: Yes Total Score: 0 Level Of Care: ____ [name] ([id number]) Electronic Signature(s) Signed: 01/13/2021 4:38:38 PM By: Donnamarie Poag Entered By: Donnamarie Poag on 01/13/2021  15:34:22 Feld, [name] ([id number]) -------------------------------------------------------------------------------- Compression Therapy Details Patient Name: [name], [name] Date  of Service: 01/13/2021 3:00 PM Medical Record Number: [id number] Patient Account Number: [id number] Date of Birth/Sex: 05-31-1928 (87 y.o. F) Treating RN: Donnamarie Poag Primary Care Mellie Buccellato: Viviana Simpler Other Clinician: Referring Jarret Torre: Viviana Simpler Treating Elysia Grand/Extender: Skipper Cliche in Treatment: 12 Compression Therapy Performed for Wound Assessment: Wound #1 Right,Lateral Malleolus Performed By: Junius Argyle, RN Compression Type: Three Layer Post Procedure Diagnosis Same as Pre-procedure Electronic Signature(s) Signed: 01/13/2021 4:38:38 PM By: Donnamarie Poag Entered By: Donnamarie Poag on 01/13/2021 15:34:47 Plotner, [name] ([id number]) -------------------------------------------------------------------------------- Compression Therapy Details Patient Name: [name], [name] Date of Service: 01/13/2021 3:00 PM Medical Record Number: [id number] Patient Account Number: [id number] Date of Birth/Sex: 1927/08/11 (87 y.o. F) Treating RN: Donnamarie Poag Primary Care Taten Merrow: Viviana Simpler Other Clinician: Referring Naviyah Schaffert: Viviana Simpler Treating Nijah Orlich/Extender: Skipper Cliche in Treatment: 12 Compression Therapy Performed for Wound Assessment: Wound #2 Left Calcaneus Performed By: Clinician Donnamarie Poag, RN Compression Type: Three Layer Post Procedure Diagnosis Same as Pre-procedure Electronic Signature(s) Signed: 01/13/2021 4:38:38 PM By: Donnamarie Poag Entered By: Donnamarie Poag on 01/13/2021 15:34:59 Stecher, [name] ([id number]) -------------------------------------------------------------------------------- Encounter Discharge Information Details Patient Name: [name], [name] Date of Service: 01/13/2021 3:00 PM Medical Record Number: [id number] Patient Account Number:  [id number] Date of Birth/Sex: [birth year] (87 y.o. F) Treating RN: Donnamarie Poag Primary Care Kolton Kienle: Viviana Simpler Other Clinician: Referring Larita Deremer: Viviana Simpler Treating Bonnie Roig/Extender: Skipper Cliche in Treatment: 12 Encounter Discharge Information Items Discharge Condition: Stable Ambulatory Status: Walker Discharge Destination: Home Transportation: Private Auto Accompanied By: daughter in law Schedule Follow-up Appointment: Yes Clinical Summary of Care: Electronic Signature(s) Signed: 01/13/2021 4:38:38 PM By: Donnamarie Poag Entered By: Donnamarie Poag on 01/13/2021 16:01:59 Comacho, [name] ([id number]) -------------------------------------------------------------------------------- Lower Extremity Assessment Details Patient Name: [name], [name] Date of Service: 01/13/2021 3:00 PM Medical Record Number: [id number] Patient Account Number: [id number] Date of Birth/Sex: 02/08/1928 (87 y.o. F) Treating RN: Donnamarie Poag Primary Care Krystie Leiter: Viviana Simpler Other Clinician: Referring Bellatrix Devonshire: Viviana Simpler Treating Riata Ikeda/Extender: Skipper Cliche in Treatment: 12 Edema Assessment Assessed: Shirlyn Goltz: Yes] Patrice Paradise: Yes] Edema: [Left: Yes] [Right: Yes] Calf Left: Right: Point of Measurement: 33 cm From Medial Instep 27 cm 26.5 cm Ankle Left: Right: Point of Measurement: 9 cm From Medial Instep 18.5 cm 18.5 cm Knee To Floor Left: Right: From Medial Instep 42 cm 42 cm Vascular Assessment Pulses: Dorsalis Pedis Palpable: [Left:Yes] [Right:Yes] Electronic Signature(s) Signed: 01/13/2021 3:14:45 PM By: Donnamarie Poag Entered By: Donnamarie Poag on 01/13/2021 15:09:02 Halbleib, [name] ([id number]) -------------------------------------------------------------------------------- Multi Wound Chart Details Patient Name: [name] Date of Service: 01/13/2021 3:00 PM Medical Record Number: [id number] Patient Account Number: [id number] Date of Birth/Sex: 07/12/1927 (87 y.o.  F) Treating RN: Donnamarie Poag Primary Care Ashauna Bertholf: Viviana Simpler Other Clinician: Referring Keiara Sneeringer: Viviana Simpler Treating Stepheny Canal/Extender: Skipper Cliche in Treatment: 12 Vital Signs Height(in): 66 Pulse(bpm): 70 Weight(lbs): 71 Blood Pressure(mmHg): 159/78 Body Mass Index(BMI): 12 Temperature(F): 98.2 Respiratory Rate(breaths/min): 16 Photos: [N/A:N/A] Wound Location: Right, Lateral Malleolus Left Calcaneus N/A Wounding Event: Gradually Appeared Gradually Appeared N/A Primary Etiology: Pressure Ulcer Pressure Ulcer N/A Comorbid History: Arrhythmia, Coronary Artery Arrhythmia, Coronary Artery N/A Disease, Hypertension, Peripheral Disease, Hypertension, Peripheral Arterial Disease, History of pressure Arterial Disease, History of pressure wounds, Osteoarthritis wounds, Osteoarthritis Date Acquired: 10/22/2014 07/09/2020 N/A Weeks of Treatment: 12 12 N/A Wound Status: Open Open N/A Measurements L x W x D (cm) 1.4x1.5x0.2 1.1x1x0.3 N/A Area (cm) : 1.649 0.864 N/A Volume (cm) : 0.33 0.259 N/A % Reduction in Area: -110.10% 43.60%  N/A % Reduction in Volume: -317.70% -69.30% N/A Classification: Category/Stage III Unstageable/Unclassified N/A Exudate Amount: Medium Medium N/A Exudate Type: Serous Serous N/A Exudate Color: amber amber N/A Wound Margin: Flat and Intact N/A N/A Granulation Amount: Medium (34-66%) Medium (34-66%) N/A Granulation Quality: Red, Pink Red, Pink N/A Necrotic Amount: Medium (34-66%) Medium (34-66%) N/A Exposed Structures: Fat Layer (Subcutaneous Tissue): Fat Layer (Subcutaneous Tissue): N/A Yes Yes Fascia: No Fascia: No Tendon: No Tendon: No Muscle: No Muscle: No Joint: No Joint: No Bone: No Bone: No Epithelialization: None None N/A Treatment Notes Electronic Signature(s) Signed: 01/13/2021 3:14:45 PM By: Donnamarie Poag Entered By: Donnamarie Poag on 01/13/2021 15:10:09 [name]  ([id number]) -------------------------------------------------------------------------------- [place] Details Patient Name: [name], [name] Date of Service: 01/13/2021 3:00 PM Medical Record Number: [id number] Patient Account Number: [id number] Date of Birth/Sex: [birth year] (87 y.o. F) Treating RN: Donnamarie Poag Primary Care Lashaye Fisk: Viviana Simpler Other Clinician: Referring Selassie Spatafore: Viviana Simpler Treating Zaidan Keeble/Extender: Skipper Cliche in Treatment: 12 Active Inactive Wound/Skin Impairment Nursing Diagnoses: Impaired tissue integrity Goals: Patient/caregiver will verbalize understanding of skin care regimen Date Initiated: 10/21/2020 Date Inactivated: 12/23/2020 Target Resolution Date: 12/21/2020 Goal Status: Met Ulcer/skin breakdown will have a volume reduction of 30% by week 4 Date Initiated: 10/21/2020 Date Inactivated: 11/25/2020 Target Resolution Date: 11/20/2020 Goal Status: Met Ulcer/skin breakdown will have a volume reduction of 50% by week 8 Date Initiated: 10/21/2020 Date Inactivated: 12/23/2020 Target Resolution Date: 12/21/2020 Goal Status: Met Ulcer/skin breakdown will have a volume reduction of 80% by week 12 Date Initiated: 10/21/2020 Target Resolution Date: 01/20/2021 Goal Status: Active Ulcer/skin breakdown will heal within 14 weeks Date Initiated: 10/21/2020 Target Resolution Date: 02/20/2021 Goal Status: Active Interventions: Assess patient/caregiver ability to obtain necessary supplies Assess patient/caregiver ability to perform ulcer/skin care regimen upon admission and as needed Assess ulceration(s) every visit Notes: Electronic Signature(s) Signed: 01/13/2021 3:14:45 PM By: Donnamarie Poag Entered By: Donnamarie Poag on 01/13/2021 15:09:59 Strutz, [name] ([id number]) -------------------------------------------------------------------------------- Pain Assessment Details Patient Name: [name], [name] Date of Service: 01/13/2021 3:00  PM Medical Record Number: [id number] Patient Account Number: [id number] Date of Birth/Sex: 04-14-1928 (87 y.o. F) Treating RN: Donnamarie Poag Primary Care Tahtiana Rozier: Viviana Simpler Other Clinician: Referring Hanadi Stanly: Viviana Simpler Treating Leira Regino/Extender: Skipper Cliche in Treatment: 12 Active Problems Location of Pain Severity and Description of Pain Patient Has Paino No Site Locations Rate the pain. Current Pain Level: 0 Pain Management and Medication Current Pain Management: Electronic Signature(s) Signed: 01/13/2021 3:14:45 PM By: Donnamarie Poag Entered By: Donnamarie Poag on 01/13/2021 15:03:18 [name] ([id number]) -------------------------------------------------------------------------------- Patient/Caregiver Education Details Patient Name: [name] Date of Service: 01/13/2021 3:00 PM Medical Record Number: [id number] Patient Account Number: [id number] Date of Birth/Gender: [birth year] (87 y.o. F) Treating RN: Donnamarie Poag Primary Care Physician: Viviana Simpler Other Clinician: Referring Physician: Viviana Simpler Treating Physician/Extender: Skipper Cliche in Treatment: 12 Education Assessment Education Provided To: Patient and Caregiver Education Topics Provided Basic Hygiene: Methods: Explain/Verbal Responses: State content correctly Venous: Methods: Explain/Verbal Responses: State content correctly Wound/Skin Impairment: Methods: Demonstration, Explain/Verbal Responses: State content correctly Electronic Signature(s) Signed: 01/13/2021 3:14:45 PM By: Donnamarie Poag Entered By: Donnamarie Poag on 01/13/2021 15:10:37 Caffrey, [name] ([id number]) -------------------------------------------------------------------------------- Wound Assessment Details Patient Name: [name], [name] Date of Service: 01/13/2021 3:00 PM Medical Record Number: [id number] Patient Account Number: [id number] Date of Birth/Sex: 17-Sep-1927 (87 y.o. F) Treating RN: Donnamarie Poag Primary  Care Chellsie Gomer: Viviana Simpler Other Clinician: Referring Cosette Prindle: Viviana Simpler Treating Darnice Comrie/Extender: Skipper Cliche in  Treatment: 12 Wound Status Wound Number: 1 Primary Pressure Ulcer Etiology: Wound Location: Right, Lateral Malleolus Wound Open Wounding Event: Gradually Appeared Status: Date Acquired: 10/22/2014 Comorbid Arrhythmia, Coronary Artery Disease, Hypertension, Weeks Of Treatment: 12 History: Peripheral Arterial Disease, History of pressure wounds, Clustered Wound: No Osteoarthritis Photos Wound Measurements Length: (cm) 1.4 Width: (cm) 1.5 Depth: (cm) 0.2 Area: (cm) 1.649 Volume: (cm) 0.33 % Reduction in Area: -110.1% % Reduction in Volume: -317.7% Epithelialization: None Tunneling: No Undermining: No Wound Description Classification: Category/Stage III Wound Margin: Flat and Intact Exudate Amount: Medium Exudate Type: Serous Exudate Color: amber Foul Odor After Cleansing: No Slough/Fibrino Yes Wound Bed Granulation Amount: Medium (34-66%) Exposed Structure Granulation Quality: Red, Pink Fascia Exposed: No Necrotic Amount: Medium (34-66%) Fat Layer (Subcutaneous Tissue) Exposed: Yes Necrotic Quality: Adherent Slough Tendon Exposed: No Muscle Exposed: No Joint Exposed: No Bone Exposed: No Treatment Notes Wound #1 (Malleolus) Wound Laterality: Right, Lateral Cleanser Soap and Water Discharge Instruction: Gently cleanse wound with antibacterial soap, rinse and pat dry prior to dressing wounds EMILEIGH, KELLETT ([id number]) Peri-Wound Care Topical Primary Dressing Prisma 4.34 (in) Discharge Instruction: Moisten w/normal saline or sterile water; Cover wound as directed. Do not remove from wound bed. Secondary Dressing Drawtex Hydroconductive Wound Dressing,o2x2 (in/in) Discharge Instruction: Apply to wound bed on top of collagen, then cover with larger piece of drawtexwith drainage. Secured With Compression Wrap Profore Lite LF 3  Multilayer Compression Bandaging System Discharge Instruction: Apply 3 multi-layer wrap as prescribed. CoFlex TLC Lite 2Layer Compression System, 25 to 30 mmHg Compression Stockings Add-Ons Electronic Signature(s) Signed: 01/13/2021 3:14:45 PM By: Donnamarie Poag Entered By: Donnamarie Poag on 01/13/2021 15:06:53 Beale, [name] ([id number]) -------------------------------------------------------------------------------- Wound Assessment Details Patient Name: [name], [name] Date of Service: 01/13/2021 3:00 PM Medical Record Number: [id number] Patient Account Number: [id number] Date of Birth/Sex: 1928-04-03 (87 y.o. F) Treating RN: Donnamarie Poag Primary Care Stacia Feazell: Viviana Simpler Other Clinician: Referring Nevayah Faust: Viviana Simpler Treating Lennyx Verdell/Extender: Skipper Cliche in Treatment: 12 Wound Status Wound Number: 2 Primary Pressure Ulcer Etiology: Wound Location: Left Calcaneus Wound Open Wounding Event: Gradually Appeared Status: Date Acquired: 07/09/2020 Comorbid Arrhythmia, Coronary Artery Disease, Hypertension, Weeks Of Treatment: 12 History: Peripheral Arterial Disease, History of pressure wounds, Clustered Wound: No Osteoarthritis Photos Wound Measurements Length: (cm) 1.1 Width: (cm) 1 Depth: (cm) 0.3 Area: (cm) 0.864 Volume: (cm) 0.259 % Reduction in Area: 43.6% % Reduction in Volume: -69.3% Epithelialization: None Tunneling: No Undermining: No Wound Description Classification: Unstageable/Unclassified Exudate Amount: Medium Exudate Type: Serous Exudate Color: amber Foul Odor After Cleansing: No Slough/Fibrino Yes Wound Bed Granulation Amount: Medium (34-66%) Exposed Structure Granulation Quality: Red, Pink Fascia Exposed: No Necrotic Amount: Medium (34-66%) Fat Layer (Subcutaneous Tissue) Exposed: Yes Necrotic Quality: Adherent Slough Tendon Exposed: No Muscle Exposed: No Joint Exposed: No Bone Exposed: No Treatment Notes Wound #2 (Calcaneus) Wound  Laterality: Left Cleanser Soap and Water Discharge Instruction: Gently cleanse wound with antibacterial soap, rinse and pat dry prior to dressing wounds Peri-Wound Care VICKEE, MORMINO ([id number]) Topical Primary Dressing Prisma 4.34 (in) Discharge Instruction: Moisten w/normal saline or sterile water; Cover wound as directed. Do not remove from wound bed. Secondary Dressing Drawtex Hydroconductive Wound Dressing,o2x2 (in/in) Discharge Instruction: Apply to wound bed on top of collagen, then cover with larger piece of drawtexwith drainage. Secured With Compression Wrap Profore Lite LF 3 Multilayer Compression Bandaging System Discharge Instruction: Apply 3 multi-layer wrap as prescribed. CoFlex TLC Lite 2Layer Compression System, 25 to 30 mmHg Compression Stockings Add-Ons Electronic Signature(s) Signed:  01/13/2021 3:14:45 PM By: Donnamarie Poag Entered By: Donnamarie Poag on 01/13/2021 15:06:32 Kuroda, [name] ([id number]) -------------------------------------------------------------------------------- [place] Details Patient Name: [name], [name] Date of Service: 01/13/2021 3:00 PM Medical Record Number: [id number] Patient Account Number: [id number] Date of Birth/Sex: [birth year] (87 y.o. F) Treating RN: Donnamarie Poag Primary Care Othon Guardia: Viviana Simpler Other Clinician: Referring Cecia Egge: Viviana Simpler Treating Miquan Tandon/Extender: Skipper Cliche in Treatment: 12 Vital Signs Time Taken: 14:58 Temperature (F): 98.2 Height (in): 66 Pulse (bpm): 70 Weight (lbs): 76 Respiratory Rate (breaths/min): 16 Body Mass Index (BMI): 12.3 Blood Pressure (mmHg): 159/78 Reference Range: 80 - 120 mg / dl Electronic Signature(s) Signed: 01/13/2021 3:14:45 PM By: Donnamarie Poag Entered ByDonnamarie Poag on 01/13/2021 15:03:05

## 2021-01-13 NOTE — Telephone Encounter (Signed)
Name of Medication: Morphine Sulfate Name of [facility]acy: [facility] or Written Date and Quantity: 12-11-20 #90 Last Office Visit and Type: 09-29-20 Next Office Visit and Type: 01/16/21 Last Controlled Substance Agreement Date: 09-29-20 Last UDS: 04-27-19  Patient is out of the medication. Please review

## 2021-01-13 NOTE — Progress Notes (Addendum)
ELVERA, ALMARIO ([id number]) Visit Report for 01/13/2021 Chief Complaint Document Details Patient Name: [name], [name]. Date of Service: 01/13/2021 3:00 PM Medical Record Number: [id number] Patient Account Number: [id number] Date of Birth/Sex: 1927-11-26 (87 y.o. F) Treating RN: Carlene Coria Primary Care Provider: Viviana Simpler Other Clinician: Referring Provider: Viviana Simpler Treating Provider/Extender: Skipper Cliche in Treatment: 12 Information Obtained from: Patient Chief Complaint Pressure ulcer right ankle and left heel and left forearm skin tear Electronic Signature(s) Signed: 01/13/2021 2:51:19 PM By: Worthy Keeler PA-C Entered By: Worthy Keeler on 01/13/2021 14:51:19 [name], [name] ([id number]) -------------------------------------------------------------------------------- HPI Details Patient Name: [name], [name] Date of Service: 01/13/2021 3:00 PM Medical Record Number: [id number] Patient Account Number: [id number] Date of Birth/Sex: 06-Jul-1928 (87 y.o. F) Treating RN: Carlene Coria Primary Care Provider: Viviana Simpler Other Clinician: Referring Provider: Viviana Simpler Treating Provider/Extender: Skipper Cliche in Treatment: 12 History of Present Illness HPI Description: 10/21/2020 upon evaluation today patient presents for initial inspection here in our clinic concerning issues that she has been having quite some time in regard to her ankle and since she has been in the hospital with regard to the heel. The ankle in fact has been 5-6 years at least I am told. With that being said the heel ulcer occurred when she was in the hospital in December for hip surgery when she fractured her hip. She also was in the hospital Tuesday for altered mental status. Really there was nothing that was identified as the cause for this. She did have a. Fortunately there does not appear to be any signs of infection she tells me that she has had she believes arterial studies At  [facility] clinic I could not find Those studies at this point. Nonetheless I will continue to look and see what I can find before Then. The patient also has a skin tear on her arm this occurred more recently when she bumped this at home. The patient does have a history of hypertension, coronary artery disease, and peripheral vascular disease stated. 10/28/2020 I was able to find the patient's chart currently which shows that she did have an arterial study performed in May 2019. This showed that she had a abnormal right toe brachial index and a normal left toe brachial index. She was noncompressible as far as ABIs were concerned. She did appear to have triphasic flow at that time. Unfortunately the wound that is commented on in the report that I printed off and read mentions the same wound on the ankle that we are still dealing with at this point. Unfortunately this has not healed. And its been quite sometime about 3 years now. Fortunately there does not appear to be any signs of active infection systemically at this point. I think that the patient has done well with the Santyl over the past week which is good news. Patient's caregiver which is her daughter-in-law is concerned about the fact that she really does not feel qualified to be able to change the dressings and take care of this issue. There does not appear to be any signs of anything untoward going on at this point. I think she is done a great job applying the [employer] and I think that has done a great job for the patient is for soften up some of the necrotic tissue. With that being said I think the Xeroform on the arm also has done excellent. In general I am very pleased with where we stand. And I told the patient's  daughter-in- law as well that I also feel like she has done a great job over the past week taking care of her mother. 11/11/2020 upon evaluation today patient appears to be doing well with regard to her wounds. She has been tolerating  the dressing changes without complication her daughters been applying the Santyl which has done a great job. Ho with that being said I think now that we have good arterial study showing we can go ahead and proceed with sharp debridement at this point. 11/25/2020 upon evaluation today patient appears to be doing well with regard to her wounds. The Santyl has really helped to clean things up and overall she is doing quite excellent at this point. Fortunately there does not appear to be any signs of infection which is great news and in general extremely pleased. I do believe some debridement is in order and hopefully will be able to get her into a collagen dressing after this. 12/23/2020 upon evaluation today patient appears to be doing well with regard to her wound all things considered. Unfortunately it does sound like her daughter decided to let this air out because she felt like it was getting so wet. It sounds like she got border gauze dressings instead of border foam therefore there is really Nothing to catch the excess drainage which I think is the problem they were worried about the smell. Fortunately there does not appear to be any signs of active infection which is great news. Nonetheless I do not think we want to leave this just open to air. 01/13/2021 upon evaluation today patient's wounds actually appear to be about as good as have seen since have been taking care of her. Fortunately there does not appear to be any evidence of infection which is great and overall the biggest issue I see is that of fluid buildup. I think we need to do something to try to help manage this. I think if we can control her edema we get the wounds to heal more effectively. Electronic Signature(s) Signed: 01/13/2021 5:00:44 PM By: Worthy Keeler PA-C Entered By: Worthy Keeler on 01/13/2021 17:00:44 [name], [name] ([id number]) -------------------------------------------------------------------------------- Physical  Exam Details Patient Name: [name], [name] Date of Service: 01/13/2021 3:00 PM Medical Record Number: [id number] Patient Account Number: [id number] Date of Birth/Sex: [birth year] (87 y.o. F) Treating RN: Carlene Coria Primary Care Provider: Viviana Simpler Other Clinician: Referring Provider: Viviana Simpler Treating Provider/Extender: Skipper Cliche in Treatment: 71 Constitutional Well-nourished and well-hydrated in no acute distress. Respiratory normal breathing without difficulty. Psychiatric this patient is able to make decisions and demonstrates good insight into disease process. Alert and Oriented x 3. pleasant and cooperative. Notes Patient's wounds currently are showing signs of excellent granulation epithelization at this point. I do not see any evidence of infection and overall I think that she is doing quite well. I believe we should see about initiating a compression wrap. Electronic Signature(s) Signed: 01/13/2021 5:01:48 PM By: Worthy Keeler PA-C Entered By: Worthy Keeler on 01/13/2021 17:01:48 [name], [name] ([id number]) -------------------------------------------------------------------------------- Physician Orders Details Patient Name: [name], [name] Date of Service: 01/13/2021 3:00 PM Medical Record Number: [id number] Patient Account Number: [id number] Date of Birth/Sex: [birth year] (87 y.o. F) Treating RN: Donnamarie Poag Primary Care Provider: Viviana Simpler Other Clinician: Referring Provider: Viviana Simpler Treating Provider/Extender: Skipper Cliche in Treatment: 12 Verbal / Phone Orders: No Diagnosis Coding ICD-10 Coding Code Description L89.513 Pressure ulcer of right ankle, stage 3 L89.623 Pressure ulcer of  left heel, stage 3 S51.802A Unspecified open wound of left forearm, initial encounter I10 Essential (primary) hypertension I25.10 Atherosclerotic heart disease of native coronary artery without angina pectoris I73.89 Other specified peripheral  vascular diseases Follow-up Appointments o Return Appointment in 1 week. - May do nurse visit to allow weekly visits if needed o Nurse Visit as needed Bathing/ Shower/ Hygiene o May shower with wound dressing protected with water repellent cover or cast protector. o No tub bath. Edema Control - Lymphedema / Segmental Compressive Device / Other o Optional: One layer of unna paste to top of compression wrap (to act as an anchor). o Elevate legs to the level of the heart and pump ankles as often as possible o Elevate leg(s) parallel to the floor when sitting. o DO YOUR BEST to sleep in the bed at night. DO NOT sleep in your recliner. Long hours of sitting in a recliner leads to swelling of the legs and/or potential wounds on your backside. Off-Loading o Turn and reposition every 2 hours Wound Treatment Wound #1 - Malleolus Wound Laterality: Right, Lateral Cleanser: Soap and Water 1 x Per Day/30 Days Discharge Instructions: Gently cleanse wound with antibacterial soap, rinse and pat dry prior to dressing wounds Primary Dressing: Prisma 4.34 (in) (Generic) 1 x Per Day/30 Days Discharge Instructions: Moisten w/normal saline or sterile water; Cover wound as directed. Do not remove from wound bed. Secondary Dressing: Drawtex Hydroconductive Wound Dressing,o2x2 (in/in) 1 x Per Day/30 Days Discharge Instructions: Apply to wound bed on top of collagen, then cover with larger piece of drawtexwith drainage. Compression Wrap: Profore Lite LF 3 Multilayer Compression Bandaging System 1 x Per Day/30 Days Discharge Instructions: Apply 3 multi-layer wrap as prescribed. Compression Wrap: CoFlex TLC Lite 2Layer Compression System, 25 to 30 mmHg 1 x Per Day/30 Days Wound #2 - Calcaneus Wound Laterality: Left Cleanser: Soap and Water 1 x Per Day/30 Days Discharge Instructions: Gently cleanse wound with antibacterial soap, rinse and pat dry prior to dressing wounds Primary Dressing: Prisma  4.34 (in) (Generic) 1 x Per Day/30 Days Discharge Instructions: Moisten w/normal saline or sterile water; Cover wound as directed. Do not remove from wound bed. Secondary Dressing: Drawtex Hydroconductive Wound Dressing,o2x2 (in/in) 1 x Per Day/30 Days Discharge Instructions: Apply to wound bed on top of collagen, then cover with larger piece of drawtexwith drainage. [name], [name] ([id number]) Compression Wrap: Profore Lite LF 3 Multilayer Compression Bandaging System 1 x Per Day/30 Days Discharge Instructions: Apply 3 multi-layer wrap as prescribed. Compression Wrap: CoFlex TLC Lite 2Layer Compression System, 25 to 30 mmHg 1 x Per Day/30 Days Electronic Signature(s) Signed: 01/13/2021 4:38:38 PM By: Donnamarie Poag Signed: 01/13/2021 5:03:48 PM By: Worthy Keeler PA-C Entered By: Donnamarie Poag on 01/13/2021 15:41:40 [name], [name] ([id number]) -------------------------------------------------------------------------------- Problem List Details Patient Name: TAKEELA, PEIL Date of Service: 01/13/2021 3:00 PM Medical Record Number: [id number] Patient Account Number: [id number] Date of Birth/Sex: [birth year] (87 y.o. F) Treating RN: Carlene Coria Primary Care Provider: Viviana Simpler Other Clinician: Referring Provider: Viviana Simpler Treating Provider/Extender: Skipper Cliche in Treatment: 12 Active Problems ICD-10 Encounter Code Description Active Date MDM Diagnosis L89.513 Pressure ulcer of right ankle, stage 3 10/21/2020 No Yes L89.623 Pressure ulcer of left heel, stage 3 10/21/2020 No Yes S51.802A Unspecified open wound of left forearm, initial encounter 10/21/2020 No Yes I10 Essential (primary) hypertension 10/21/2020 No Yes I25.10 Atherosclerotic heart disease of native coronary artery without angina 10/21/2020 No Yes pectoris I73.89 Other specified peripheral vascular diseases 10/21/2020  No Yes Inactive Problems Resolved Problems Electronic Signature(s) Signed: 01/13/2021 2:51:14 PM  By: Worthy Keeler PA-C Entered By: Worthy Keeler on 01/13/2021 14:51:13 [name], [name] ([id number]) -------------------------------------------------------------------------------- Progress Note Details Patient Name: [name], [name] Date of Service: 01/13/2021 3:00 PM Medical Record Number: [id number] Patient Account Number: [id number] Date of Birth/Sex: 05-23-1928 (87 y.o. F) Treating RN: Carlene Coria Primary Care Provider: Viviana Simpler Other Clinician: Referring Provider: Viviana Simpler Treating Provider/Extender: Skipper Cliche in Treatment: 12 Subjective Chief Complaint Information obtained from Patient Pressure ulcer right ankle and left heel and left forearm skin tear History of Present Illness (HPI) 10/21/2020 upon evaluation today patient presents for initial inspection here in our clinic concerning issues that she has been having quite some time in regard to her ankle and since she has been in the hospital with regard to the heel. The ankle in fact has been 5-6 years at least I am told. With that being said the heel ulcer occurred when she was in the hospital in December for hip surgery when she fractured her hip. She also was in the hospital Tuesday for altered mental status. Really there was nothing that was identified as the cause for this. She did have a. Fortunately there does not appear to be any signs of infection she tells me that she has had she believes arterial studies At [facility] clinic I could not find Those studies at this point. Nonetheless I will continue to look and see what I can find before Then. The patient also has a skin tear on her arm this occurred more recently when she bumped this at home. The patient does have a history of hypertension, coronary artery disease, and peripheral vascular disease stated. 10/28/2020 I was able to find the patient's chart currently which shows that she did have an arterial study performed in May 2019. This showed that  she had a abnormal right toe brachial index and a normal left toe brachial index. She was noncompressible as far as ABIs were concerned. She did appear to have triphasic flow at that time. Unfortunately the wound that is commented on in the report that I printed off and read mentions the same wound on the ankle that we are still dealing with at this point. Unfortunately this has not healed. And its been quite sometime about 3 years now. Fortunately there does not appear to be any signs of active infection systemically at this point. I think that the patient has done well with the Santyl over the past week which is good news. Patient's caregiver which is her daughter-in-law is concerned about the fact that she really does not feel qualified to be able to change the dressings and take care of this issue. There does not appear to be any signs of anything untoward going on at this point. I think she is done a great job applying the [employer] and I think that has done a great job for the patient is for soften up some of the necrotic tissue. With that being said I think the Xeroform on the arm also has done excellent. In general I am very pleased with where we stand. And I told the patient's daughter-in- law as well that I also feel like she has done a great job over the past week taking care of her mother. 11/11/2020 upon evaluation today patient appears to be doing well with regard to her wounds. She has been tolerating the dressing changes without complication her daughters been applying the  Santyl which has done a great job. Ho with that being said I think now that we have good arterial study showing we can go ahead and proceed with sharp debridement at this point. 11/25/2020 upon evaluation today patient appears to be doing well with regard to her wounds. The Santyl has really helped to clean things up and overall she is doing quite excellent at this point. Fortunately there does not appear to be any signs of  infection which is great news and in general extremely pleased. I do believe some debridement is in order and hopefully will be able to get her into a collagen dressing after this. 12/23/2020 upon evaluation today patient appears to be doing well with regard to her wound all things considered. Unfortunately it does sound like her daughter decided to let this air out because she felt like it was getting so wet. It sounds like she got border gauze dressings instead of border foam therefore there is really Nothing to catch the excess drainage which I think is the problem they were worried about the smell. Fortunately there does not appear to be any signs of active infection which is great news. Nonetheless I do not think we want to leave this just open to air. 01/13/2021 upon evaluation today patient's wounds actually appear to be about as good as have seen since have been taking care of her. Fortunately there does not appear to be any evidence of infection which is great and overall the biggest issue I see is that of fluid buildup. I think we need to do something to try to help manage this. I think if we can control her edema we get the wounds to heal more effectively. Objective Constitutional Well-nourished and well-hydrated in no acute distress. Vitals Time Taken: 2:58 PM, Height: 66 in, Weight: 76 lbs, BMI: 12.3, Temperature: 98.2 F, Pulse: 70 bpm, Respiratory Rate: 16 breaths/min, Blood Pressure: 159/78 mmHg. [name], [name] ([id number]) Respiratory normal breathing without difficulty. Psychiatric this patient is able to make decisions and demonstrates good insight into disease process. Alert and Oriented x 3. pleasant and cooperative. General Notes: Patient's wounds currently are showing signs of excellent granulation epithelization at this point. I do not see any evidence of infection and overall I think that she is doing quite well. I believe we should see about initiating a compression  wrap. Integumentary (Hair, Skin) Wound #1 status is Open. Original cause of wound was Gradually Appeared. The date acquired was: 10/22/2014. The wound has been in treatment 12 weeks. The wound is located on the Right,Lateral Malleolus. The wound measures 1.4cm length x 1.5cm width x 0.2cm depth; 1.649cm^2 area and 0.33cm^3 volume. There is Fat Layer (Subcutaneous Tissue) exposed. There is no tunneling or undermining noted. There is a medium amount of serous drainage noted. The wound margin is flat and intact. There is medium (34-66%) red, pink granulation within the wound bed. There is a medium (34-66%) amount of necrotic tissue within the wound bed including Adherent Slough. Wound #2 status is Open. Original cause of wound was Gradually Appeared. The date acquired was: 07/09/2020. The wound has been in treatment 12 weeks. The wound is located on the Left Calcaneus. The wound measures 1.1cm length x 1cm width x 0.3cm depth; 0.864cm^2 area and 0.259cm^3 volume. There is Fat Layer (Subcutaneous Tissue) exposed. There is no tunneling or undermining noted. There is a medium amount of serous drainage noted. There is medium (34-66%) red, pink granulation within the wound bed. There is a  medium (34-66%) amount of necrotic tissue within the wound bed including Adherent Slough. Assessment Active Problems ICD-10 Pressure ulcer of right ankle, stage 3 Pressure ulcer of left heel, stage 3 Unspecified open wound of left forearm, initial encounter Essential (primary) hypertension Atherosclerotic heart disease of native coronary artery without angina pectoris Other specified peripheral vascular diseases Procedures Wound #1 Pre-procedure diagnosis of Wound #1 is a Pressure Ulcer located on the Right,Lateral Malleolus . There was a Three Layer Compression Therapy Procedure by Donnamarie Poag, RN. Post procedure Diagnosis Wound #1: Same as Pre-Procedure Wound #2 Pre-procedure diagnosis of Wound #2 is a Pressure  Ulcer located on the Left Calcaneus . There was a Three Layer Compression Therapy Procedure by Donnamarie Poag, RN. Post procedure Diagnosis Wound #2: Same as Pre-Procedure Plan Follow-up Appointments: Return Appointment in 1 week. - May do nurse visit to allow weekly visits if needed Nurse Visit as needed Bathing/ Shower/ Hygiene: May shower with wound dressing protected with water repellent cover or cast protector. No tub bath. Edema Control - Lymphedema / Segmental Compressive Device / Other: Optional: One layer of unna paste to top of compression wrap (to act as an anchor). Elevate legs to the level of the heart and pump ankles as often as possible Elevate leg(s) parallel to the floor when sitting. DO YOUR BEST to sleep in the bed at night. DO NOT sleep in your recliner. Long hours of sitting in a recliner leads to swelling of the legs and/or potential wounds on your backside. [name], [name] ([id number]) Off-Loading: Turn and reposition every 2 hours WOUND #1: - Malleolus Wound Laterality: Right, Lateral Cleanser: Soap and Water 1 x Per Day/30 Days Discharge Instructions: Gently cleanse wound with antibacterial soap, rinse and pat dry prior to dressing wounds Primary Dressing: Prisma 4.34 (in) (Generic) 1 x Per Day/30 Days Discharge Instructions: Moisten w/normal saline or sterile water; Cover wound as directed. Do not remove from wound bed. Secondary Dressing: Drawtex Hydroconductive Wound Dressing, 2x2 (in/in) 1 x Per Day/30 Days Discharge Instructions: Apply to wound bed on top of collagen, then cover with larger piece of drawtexwith drainage. Compression Wrap: Profore Lite LF 3 Multilayer Compression Bandaging System 1 x Per Day/30 Days Discharge Instructions: Apply 3 multi-layer wrap as prescribed. Compression Wrap: CoFlex TLC Lite 2Layer Compression System, 25 to 30 mmHg 1 x Per Day/30 Days WOUND #2: - Calcaneus Wound Laterality: Left Cleanser: Soap and Water 1 x Per Day/30  Days Discharge Instructions: Gently cleanse wound with antibacterial soap, rinse and pat dry prior to dressing wounds Primary Dressing: Prisma 4.34 (in) (Generic) 1 x Per Day/30 Days Discharge Instructions: Moisten w/normal saline or sterile water; Cover wound as directed. Do not remove from wound bed. Secondary Dressing: Drawtex Hydroconductive Wound Dressing, 2x2 (in/in) 1 x Per Day/30 Days Discharge Instructions: Apply to wound bed on top of collagen, then cover with larger piece of drawtexwith drainage. Compression Wrap: Profore Lite LF 3 Multilayer Compression Bandaging System 1 x Per Day/30 Days Discharge Instructions: Apply 3 multi-layer wrap as prescribed. Compression Wrap: CoFlex TLC Lite 2Layer Compression System, 25 to 30 mmHg 1 x Per Day/30 Days 1. Would recommend to go ahead and initiate treatment with a compression wrap I can use a 3 layer compression wrap currently which I think would be a good option for her. With regard to the wounds were can use the collagen at the base of the wounds followed by drawtex over top to help hold this in place and controlling moisture and drainage.  2. I am also can recommend at this time that we have the patient continue to elevate her legs much as possible and the main thing she has to do however is keep her legs from getting wet. I think a cast protector is appropriate and they know what to do in this regard. We will see patient back for reevaluation in 1 week here in the clinic. If anything worsens or changes patient will contact our office for additional recommendations. Electronic Signature(s) Signed: 01/13/2021 5:02:48 PM By: Worthy Keeler PA-C Entered By: Worthy Keeler on 01/13/2021 17:02:48 [name], [name] ([id number]) -------------------------------------------------------------------------------- SuperBill Details Patient Name: LAQUIDA, COTRELL Date of Service: 01/13/2021 Medical Record Number: [id number] Patient Account Number:  [id number] Date of Birth/Sex: 06-26-1928 (87 y.o. F) Treating RN: Donnamarie Poag Primary Care Provider: Viviana Simpler Other Clinician: Referring Provider: Viviana Simpler Treating Provider/Extender: Skipper Cliche in Treatment: 12 Diagnosis Coding ICD-10 Codes Code Description [phone] Pressure ulcer of right ankle, stage 3 L89.623 Pressure ulcer of left heel, stage 3 S51.802A Unspecified open wound of left forearm, initial encounter I10 Essential (primary) hypertension I25.10 Atherosclerotic heart disease of native coronary artery without angina pectoris I73.89 Other specified peripheral vascular diseases Facility Procedures CPT4: Description Modifier Quantity Code [id number] BILATERAL: Application of multi-layer venous compression system; leg (below knee), including 1 ankle and foot. Physician Procedures CPT4 Code: 3953202 Description: 33435 - WC PHYS LEVEL 4 - EST PT Modifier: Quantity: 1 CPT4 Code: Description: ICD-10 Diagnosis Description L89.513 Pressure ulcer of right ankle, stage 3 L89.623 Pressure ulcer of left heel, stage 3 S51.802A Unspecified open wound of left forearm, initial encounter I10 Essential (primary) hypertension Modifier: Quantity: Electronic Signature(s) Signed: 01/13/2021 5:03:25 PM By: Worthy Keeler PA-C Previous Signature: 01/13/2021 4:38:38 PM Version By: Donnamarie Poag Entered By: Worthy Keeler on 01/13/2021 17:03:24

## 2021-01-16 ENCOUNTER — Ambulatory Visit (INDEPENDENT_AMBULATORY_CARE_PROVIDER_SITE_OTHER): Payer: PPO | Admitting: Internal Medicine

## 2021-01-16 ENCOUNTER — Other Ambulatory Visit: Payer: Self-pay

## 2021-01-16 ENCOUNTER — Encounter: Payer: Self-pay | Admitting: Internal Medicine

## 2021-01-16 DIAGNOSIS — I739 Peripheral vascular disease, unspecified: Secondary | ICD-10-CM | POA: Diagnosis not present

## 2021-01-16 DIAGNOSIS — B0229 Other postherpetic nervous system involvement: Secondary | ICD-10-CM | POA: Diagnosis not present

## 2021-01-16 DIAGNOSIS — L89312 Pressure ulcer of right buttock, stage 2: Secondary | ICD-10-CM

## 2021-01-16 DIAGNOSIS — L89302 Pressure ulcer of unspecified buttock, stage 2: Secondary | ICD-10-CM | POA: Insufficient documentation

## 2021-01-16 DIAGNOSIS — F1120 Opioid dependence, uncomplicated: Secondary | ICD-10-CM

## 2021-01-16 MED ORDER — SILVER SULFADIAZINE 1 % EX CREA: 1.0000 "application " | TOPICAL_CREAM | Freq: Every day | CUTANEOUS | 0 refills | Status: DC

## 2021-01-16 NOTE — Assessment & Plan Note (Signed)
Severe pain controlled with MSO4 three times a day

## 2021-01-16 NOTE — Assessment & Plan Note (Signed)
Sounds like she has vascular ulcers on ankles They are wrapped now and will be rechecked at the wound center

## 2021-01-16 NOTE — Progress Notes (Signed)
Subjective:    Patient ID: [name], female    DOB: Nov 13, 1927, 87 y.o.   MRN: [id number]  HPI Here for follow up of chronic pain from PHN With DIL This visit occurred during the SARS-CoV-2 public health emergency.  Safety protocols were in place, including screening questions prior to the visit, additional usage of staff PPE, and extensive cleaning of exam room while observing appropriate contact time as indicated for disinfecting solutions.   Ongoing ulcer on sides of ankles Started on left ankle after rehab from hip fracture Going to wound center Also one on outside of right foot---chronic and never healed (needed to be debrided first --DIL doing this). Now getting collagen Legs started swelling and got blisters on her legs--so started with UNNA boots (last week--goes back this week)  Now with blister on her bottom also-- noted about 10 days ago Was full of fluid at first--then broke DIL putting neosporin (or equivalent) on it Not hurting  Also having bad itching in ears Both ears  Has irritation in nose as well  Ongoing shingles pain Using the morphine three times a day regularly  Current Outpatient Medications on File Prior to Visit  Medication Sig Dispense Refill   acyclovir (ZOVIRAX) 400 MG tablet TAKE 1 TABLET BY MOUTH 4 TIMES DAILY 120 tablet 11   azelastine (ASTELIN) 0.1 % nasal spray Place 2 sprays into both nostrils 2 (two) times daily.     fexofenadine (ALLEGRA) 180 MG tablet Take 180 mg by mouth daily.     fluticasone (FLONASE) 50 MCG/ACT nasal spray PLACE 2 SPRAYS INTO EACH NOSTRIL ONCE DAILY (Patient taking differently: Place 2 sprays into both nostrils daily.) 16 g 11   hydrocortisone 2.5 % cream Apply topically 3 (three) times daily as needed. 28 g 3   LORazepam (ATIVAN) 1 MG tablet TAKE 1/2 TO ONE TABLET BY MOUTH TWICE DAILY AS NEEDED FOR ANXIETY 60 tablet 0   morphine (MSIR) 15 MG tablet TAKE 1 TABLET BY MOUTH 3 TIMES DAILY AS NEEDED FOR PAIN 90 tablet 0    Multiple Vitamin (MULTIVITAMIN) tablet Take 1 tablet by mouth once daily     pantoprazole (PROTONIX) 40 MG tablet TAKE 1 TABLET BY MOUTH TWICE A DAY 180 tablet 3   polyethylene glycol (MIRALAX / GLYCOLAX) packet Take 17 g by mouth at bedtime.     RESTASIS 0.05 % ophthalmic emulsion Place 1 drop into both eyes 2 (two) times daily.     topiramate (TOPAMAX) 50 MG tablet Take 1 tablet (50 mg total) by mouth at bedtime. 30 tablet 11   No current facility-administered medications on file prior to visit.    Allergies  Allergen Reactions   Duloxetine     REACTION: N/V Mental status change and trouble with balance   Cymbalta [Duloxetine Hcl]     Unsure of reaction   Maxitrol [Neomycin-Polymyxin-Dexameth]     Unsure of reaction   Pregabalin     REACTION: SOB, swollen lips   Tobradex [Tobramycin-Dexamethasone]     Unsure of reaction    Past Medical History:  Diagnosis Date   Arrhythmia    Arthritis    Atypical chest pain    Lexiscan myoview (5/11) with EF 84%, normal wall motion, small fixed apical  perfusion defect likely due to breast attenuation, no evidence for ischemia or infarction.  **Patient had an   Basal cell carcinoma 07/28/19 [facility]   R pretibial   Breast cancer ([place])    Bilateral mastectomies 1986.  CVA (cerebral infarction) 10/12   right lacunar   Depression    Dyspnea    Echo (5/11) was a difficult study due to breast implants but showed normal LV and RV size and systolic function.       GERD (gastroesophageal reflux disease)    Headache    s/p shingles - right side of head   History of partial thyroidectomy    Hyperlipidemia    Hypertension    in past - no current meds/issues   Obstruction of intestine ([facility])    Partial small bowel obstruction ([place]) 7/14   no surgery   Superficial basal cell carcinoma 06/18/2019   R mid to distal pretibial   Wears dentures    full upper   Zoster     with Post-herpetic neuralgia    Past Surgical History:  Procedure  Laterality Date   ABDOMINAL HYSTERECTOMY     BREAST SURGERY Bilateral    cancer - mastectomy and implant insertion   INTRAMEDULLARY (IM) NAIL INTERTROCHANTERIC Left 06/19/2020   Procedure: INTRAMEDULLARY (IM) NAIL INTERTROCHANTRIC;  Surgeon: Earnestine Leys, MD;  Location: [facility];  Service: Orthopedics;  Laterality: Left;   MASTECTOMY  1980   bilateral   NECK SURGERY     TARSORRHAPHY Bilateral 08/16/2015   Procedure: MINOR TARSORRAPHY LATERAL PLACEMENT;  Surgeon: Karle Starch, MD;  Location: [facility];  Service: Ophthalmology;  Laterality: Bilateral;   VAGINAL DELIVERY      Family History  Problem Relation Age of Onset   Heart disease Son        open heart surgery for a blockage   Heart Problems Son    Diabetes Son    Parkinson's disease Son     Social History   Socioeconomic History   Marital status: Widowed    Spouse name: Not on file   Number of children: 2   Years of education: Not on file   Highest education level: Not on file  Occupational History   Occupation: Museum/gallery exhibitions officer: RETIRED  Tobacco Use   Smoking status: Former    Pack years: 0.00   Smokeless tobacco: Never  Substance and Sexual Activity   Alcohol use: No    Alcohol/week: 0.0 standard drinks   Drug use: No   Sexual activity: Not Currently  Other Topics Concern   Not on file  Social History Narrative   No living will   No health care POA but requests daughter-in-law [name] to do this   Would like attempts at resuscitation   No feeding tube if cognitively unaware   Social Determinants of Health   Financial Resource Strain: Not on file  Food Insecurity: Not on file  Transportation Needs: Not on file  Physical Activity: Not on file  Stress: Not on file  Social Connections: Not on file  Intimate Partner Violence: Not on file   Review of Systems Weight up slightly Uses lorazepam to help sleep     Objective:   Physical Exam HENT:     Right Ear: Ear canal normal.      Left Ear: Ear canal normal.     Nose: Nose normal.  Skin:    Comments: Superficial ulcer along posterior right ischial prominence--not infected  Neurological:     Mental Status: She is alert.           Assessment & Plan:

## 2021-01-16 NOTE — Assessment & Plan Note (Signed)
PDMP reviewed No concerns 

## 2021-01-16 NOTE — Assessment & Plan Note (Signed)
At lower portion over bony prominence Probably from friction No infection Superficial Will have them cover with silvadene. Needs padding

## 2021-01-18 ENCOUNTER — Ambulatory Visit: Payer: PPO | Admitting: Dermatology

## 2021-01-20 ENCOUNTER — Other Ambulatory Visit: Payer: Self-pay

## 2021-01-20 DIAGNOSIS — L89513 Pressure ulcer of right ankle, stage 3: Secondary | ICD-10-CM | POA: Diagnosis not present

## 2021-01-23 NOTE — Progress Notes (Signed)
FERNANDA, TWADDELL ([id number]) Visit Report for 01/20/2021 Arrival Information Details Patient Name: JAMELIA, VARANO Date of Service: 01/20/2021 2:30 PM Medical Record Number: [id number] Patient Account Number: [id number] Date of Birth/Sex: Feb 04, 1928 (87 y.o. F) Treating RN: Donnamarie Poag Primary Care Kalayla Shadden: Viviana Simpler Other Clinician: Referring Aryka Coonradt: Viviana Simpler Treating Uriel Horkey/Extender: Skipper Cliche in Treatment: 13 Visit Information History Since Last Visit Added or deleted any medications: No Patient Arrived: [name] Had a fall or experienced change in No Arrival Time: 14:37 activities of daily living that may affect Accompanied By: daughter in law risk of falls: Transfer Assistance: None Hospitalized since last visit: No Patient Identification Verified: Yes Has Dressing in Place as Prescribed: Yes Secondary Verification Process Completed: Yes Has Compression in Place as Prescribed: Yes Patient Has Alerts: Yes Pain Present Now: No Patient Alerts: NOT DIABETIC TBI left .77 TBI right .76 Electronic Signature(s) Signed: 01/23/2021 2:49:51 PM By: Donnamarie Poag Entered By: Donnamarie Poag on 01/20/2021 14:38:00 Matheney, [name] ([id number]) -------------------------------------------------------------------------------- Clinic Level of Care Assessment Details Patient Name: NASTASHIA, GALLO Date of Service: 01/20/2021 2:30 PM Medical Record Number: [id number] Patient Account Number: [id number] Date of Birth/Sex: Jun 20, 1928 (87 y.o. F) Treating RN: Donnamarie Poag Primary Care Preciliano Castell: Viviana Simpler Other Clinician: Referring Patriciann Becht: Viviana Simpler Treating Alyssha Housh/Extender: Skipper Cliche in Treatment: 13 Clinic Level of Care Assessment Items TOOL 1 Quantity Score []  - Use when EandM and Procedure is performed on INITIAL visit 0 ASSESSMENTS - Nursing Assessment / Reassessment []  - General Physical Exam (combine w/ comprehensive assessment (listed just below)  when performed on new 0 pt. evals) []  - 0 Comprehensive Assessment (HX, ROS, Risk Assessments, Wounds Hx, etc.) ASSESSMENTS - Wound and Skin Assessment / Reassessment []  - Dermatologic / Skin Assessment (not related to wound area) 0 ASSESSMENTS - Ostomy and/or Continence Assessment and Care []  - Incontinence Assessment and Management 0 []  - 0 Ostomy Care Assessment and Management (repouching, etc.) PROCESS - Coordination of Care []  - Simple Patient / Family Education for ongoing care 0 []  - 0 Complex (extensive) Patient / Family Education for ongoing care []  - 0 Staff obtains Programmer, systems, Records, Test Results / Process Orders []  - 0 Staff telephones HHA, Nursing Homes / Clarify orders / etc []  - 0 Routine Transfer to another Facility (non-emergent condition) []  - 0 Routine Hospital Admission (non-emergent condition) []  - 0 New Admissions / Biomedical engineer / Ordering NPWT, Apligraf, etc. []  - 0 Emergency Hospital Admission (emergent condition) PROCESS - Special Needs []  - Pediatric / Minor Patient Management 0 []  - 0 Isolation Patient Management []  - 0 Hearing / Language / Visual special needs []  - 0 Assessment of Community assistance (transportation, D/C planning, etc.) []  - 0 Additional assistance / Altered mentation []  - 0 Support Surface(s) Assessment (bed, cushion, seat, etc.) INTERVENTIONS - Miscellaneous []  - External ear exam 0 []  - 0 Patient Transfer (multiple staff / Civil Service fast streamer / Similar devices) []  - 0 Simple Staple / Suture removal (25 or less) []  - 0 Complex Staple / Suture removal (26 or more) []  - 0 Hypo/Hyperglycemic Management (do not check if billed separately) []  - 0 Ankle / Brachial Index (ABI) - do not check if billed separately Has the patient been seen at the hospital within the last three years: Yes Total Score: 0 Level Of Care: ____ [name] ([id number]) Electronic Signature(s) Signed: 01/23/2021 2:49:51 PM By: Donnamarie Poag Entered By: Donnamarie Poag on 01/20/2021 14:50:07 Fujii, [name] ([id number]) -------------------------------------------------------------------------------- Compression Therapy  Details Patient Name: ROSHANNA, CIMINO. Date of Service: 01/20/2021 2:30 PM Medical Record Number: [id number] Patient Account Number: [id number] Date of Birth/Sex: 1927/11/27 (87 y.o. F) Treating RN: Donnamarie Poag Primary Care Louise Victory: Viviana Simpler Other Clinician: Referring Irlene Crudup: Viviana Simpler Treating Leean Amezcua/Extender: Skipper Cliche in Treatment: 13 Compression Therapy Performed for Wound Assessment: Wound #1 Right,Lateral Malleolus Performed By: Junius Argyle, RN Compression Type: Three Layer Electronic Signature(s) Signed: 01/23/2021 2:49:51 PM By: Donnamarie Poag Entered By: Donnamarie Poag on 01/20/2021 14:48:58 Dunavant, [name] ([id number]) -------------------------------------------------------------------------------- Compression Therapy Details Patient Name: MAILEN, NEWBORN Date of Service: 01/20/2021 2:30 PM Medical Record Number: [id number] Patient Account Number: [id number] Date of Birth/Sex: 1928-03-13 (88 y.o. F) Treating RN: Donnamarie Poag Primary Care Brier Firebaugh: Viviana Simpler Other Clinician: Referring Bobak Oguinn: Viviana Simpler Treating Dijon Cosens/Extender: Skipper Cliche in Treatment: 13 Compression Therapy Performed for Wound Assessment: Wound #2 Left Calcaneus Performed By: Clinician Donnamarie Poag, RN Compression Type: Three Layer Electronic Signature(s) Signed: 01/23/2021 2:49:51 PM By: Donnamarie Poag Entered By: Donnamarie Poag on 01/20/2021 Annetta South, [name] ([id number]) -------------------------------------------------------------------------------- Encounter Discharge Information Details Patient Name: ZALIYAH, MEIKLE Date of Service: 01/20/2021 2:30 PM Medical Record Number: [id number] Patient Account Number: [id number] Date of Birth/Sex: 1927-12-05 (87 y.o. F) Treating  RN: Donnamarie Poag Primary Care Ryann Leavitt: Viviana Simpler Other Clinician: Referring Laneya Gasaway: Viviana Simpler Treating Shoji Pertuit/Extender: Skipper Cliche in Treatment: 13 Encounter Discharge Information Items Discharge Condition: Stable Ambulatory Status: Walker Discharge Destination: Home Transportation: Private Auto Accompanied By: daughter in law Schedule Follow-up Appointment: Yes Clinical Summary of Care: Electronic Signature(s) Signed: 01/23/2021 2:49:51 PM By: Donnamarie Poag Entered By: Donnamarie Poag on 01/20/2021 14:50:00 Ortlieb, [name] ([id number]) -------------------------------------------------------------------------------- Wound Assessment Details Patient Name: TIFFNAY, BOSSI Date of Service: 01/20/2021 2:30 PM Medical Record Number: [id number] Patient Account Number: [id number] Date of Birth/Sex: Jul 04, 1928 (87 y.o. F) Treating RN: Donnamarie Poag Primary Care Zhamir Pirro: Viviana Simpler Other Clinician: Referring Shequita Peplinski: Viviana Simpler Treating Vahan Wadsworth/Extender: Skipper Cliche in Treatment: 13 Wound Status Wound Number: 1 Primary Pressure Ulcer Etiology: Wound Location: Right, Lateral Malleolus Wound Open Wounding Event: Gradually Appeared Status: Date Acquired: 10/22/2014 Comorbid Arrhythmia, Coronary Artery Disease, Hypertension, Weeks Of Treatment: 13 History: Peripheral Arterial Disease, History of pressure wounds, Clustered Wound: No Osteoarthritis Wound Measurements Length: (cm) 1.4 Width: (cm) 1.5 Depth: (cm) 0.2 Area: (cm) 1.649 Volume: (cm) 0.33 % Reduction in Area: -110.1% % Reduction in Volume: -317.7% Epithelialization: None Wound Description Classification: Category/Stage III Wound Margin: Flat and Intact Exudate Amount: Medium Exudate Type: Serous Exudate Color: amber Foul Odor After Cleansing: No Slough/Fibrino Yes Wound Bed Granulation Amount: Medium (34-66%) Exposed Structure Granulation Quality: Red, Pink Fascia Exposed:  No Necrotic Amount: Medium (34-66%) Fat Layer (Subcutaneous Tissue) Exposed: Yes Necrotic Quality: Adherent Slough Tendon Exposed: No Muscle Exposed: No Joint Exposed: No Bone Exposed: No Treatment Notes Wound #1 (Malleolus) Wound Laterality: Right, Lateral Cleanser Soap and Water Discharge Instruction: Gently cleanse wound with antibacterial soap, rinse and pat dry prior to dressing wounds Peri-Wound Care Topical Primary Dressing Prisma 4.34 (in) Discharge Instruction: Moisten w/normal saline or sterile water; Cover wound as directed. Do not remove from wound bed. Secondary Dressing Drawtex Hydroconductive Wound Dressing,o2x2 (in/in) Discharge Instruction: Apply to wound bed on top of collagen, then cover with larger piece of drawtexwith drainage. Secured With Compression Wrap CoFlex TLC Lite 2Layer Compression System, 25 to 30 mmHg MEREDETH, FURBER ([id number]) Compression Stockings Add-Ons Electronic Signature(s) Signed: 01/23/2021 2:49:51 PM By: Donnamarie Poag Entered By: Donnamarie Poag on 01/20/2021 14:38:26  TATUM, CORL ([id number]) -------------------------------------------------------------------------------- Wound Assessment Details Patient Name: TANEYA, CONKEL. Date of Service: 01/20/2021 2:30 PM Medical Record Number: [id number] Patient Account Number: [id number] Date of Birth/Sex: 25-Sep-1927 (87 y.o. F) Treating RN: Donnamarie Poag Primary Care Prosperity Darrough: Viviana Simpler Other Clinician: Referring Daiana Vitiello: Viviana Simpler Treating Zymere Patlan/Extender: Skipper Cliche in Treatment: 13 Wound Status Wound Number: 2 Primary Pressure Ulcer Etiology: Wound Location: Left Calcaneus Wound Open Wounding Event: Gradually Appeared Status: Date Acquired: 07/09/2020 Comorbid Arrhythmia, Coronary Artery Disease, Hypertension, Weeks Of Treatment: 13 History: Peripheral Arterial Disease, History of pressure wounds, Clustered Wound: No Osteoarthritis Wound Measurements Length:  (cm) 1.1 Width: (cm) 1 Depth: (cm) 0.3 Area: (cm) 0.864 Volume: (cm) 0.259 % Reduction in Area: 43.6% % Reduction in Volume: -69.3% Epithelialization: None Wound Description Classification: Unstageable/Unclassified Exudate Amount: Medium Exudate Type: Serous Exudate Color: amber Foul Odor After Cleansing: No Slough/Fibrino Yes Wound Bed Granulation Amount: Medium (34-66%) Exposed Structure Granulation Quality: Red, Pink Fascia Exposed: No Necrotic Amount: Medium (34-66%) Fat Layer (Subcutaneous Tissue) Exposed: Yes Necrotic Quality: Adherent Slough Tendon Exposed: No Muscle Exposed: No Joint Exposed: No Bone Exposed: No Treatment Notes Wound #2 (Calcaneus) Wound Laterality: Left Cleanser Soap and Water Discharge Instruction: Gently cleanse wound with antibacterial soap, rinse and pat dry prior to dressing wounds Peri-Wound Care Topical Primary Dressing Prisma 4.34 (in) Discharge Instruction: Moisten w/normal saline or sterile water; Cover wound as directed. Do not remove from wound bed. Secondary Dressing Drawtex Hydroconductive Wound Dressing,o2x2 (in/in) Discharge Instruction: Apply to wound bed on top of collagen, then cover with larger piece of drawtexwith drainage. Secured With Compression Wrap CoFlex TLC Lite 2Layer Compression System, 25 to 30 mmHg NITISHA, CIVELLO ([id number]) Compression Stockings Add-Ons Electronic Signature(s) Signed: 01/23/2021 2:49:51 PM By: Donnamarie Poag Entered By: Donnamarie Poag on 01/20/2021 14:38:35

## 2021-01-27 ENCOUNTER — Encounter: Payer: PPO | Admitting: Physician Assistant

## 2021-01-27 ENCOUNTER — Other Ambulatory Visit: Payer: Self-pay

## 2021-01-27 DIAGNOSIS — L89513 Pressure ulcer of right ankle, stage 3: Secondary | ICD-10-CM | POA: Diagnosis not present

## 2021-01-27 DIAGNOSIS — L89313 Pressure ulcer of right buttock, stage 3: Secondary | ICD-10-CM | POA: Diagnosis not present

## 2021-01-27 DIAGNOSIS — L89623 Pressure ulcer of left heel, stage 3: Secondary | ICD-10-CM | POA: Diagnosis not present

## 2021-01-27 NOTE — Progress Notes (Addendum)
ORLY, QUIMBY ([id number]) Visit Report for 01/27/2021 Arrival Information Details Patient Name: [name], [name] Date of Service: 01/27/2021 3:00 PM Medical Record Number: [id number] Patient Account Number: [id number] Date of Birth/Sex: 03-06-1928 (87 y.o. F) Treating RN: Donnamarie Poag Primary Care Davidson Palmieri: Viviana Simpler Other Clinician: Referring Jacobie Stamey: Viviana Simpler Treating Dwight Adamczak/Extender: Skipper Cliche in Treatment: 14 Visit Information History Since Last Visit Added or deleted any medications: No Patient Arrived: [name] Had a fall or experienced change in No Arrival Time: 14:49 activities of daily living that may affect Accompanied By: daughter risk of falls: Transfer Assistance: None Hospitalized since last visit: No Patient Has Alerts: Yes Has Dressing in Place as Prescribed: Yes Patient Alerts: NOT DIABETIC Pain Present Now: No TBI left .77 TBI right .76 Electronic Signature(s) Signed: 01/27/2021 6:24:05 PM By: Donnamarie Poag Entered By: Donnamarie Poag on 01/27/2021 14:52:08 [name] ([id number]) -------------------------------------------------------------------------------- Clinic Level of Care Assessment Details Patient Name: [name] Date of Service: 01/27/2021 3:00 PM Medical Record Number: [id number] Patient Account Number: [id number] Date of Birth/Sex: January 05, 1928 (87 y.o. F) Treating RN: Donnamarie Poag Primary Care Shahzain Kiester: Viviana Simpler Other Clinician: Referring Sallye Lunz: Viviana Simpler Treating Lochlin Eppinger/Extender: Skipper Cliche in Treatment: 14 Clinic Level of Care Assessment Items TOOL 1 Quantity Score [] - Use when EandM and Procedure is performed on INITIAL visit 0 ASSESSMENTS - Nursing Assessment / Reassessment [] - General Physical Exam (combine w/ comprehensive assessment (listed just below) when performed on new 0 pt. evals) [] - 0 Comprehensive Assessment (HX, ROS, Risk Assessments, Wounds Hx, etc.) ASSESSMENTS - Wound  and Skin Assessment / Reassessment [] - Dermatologic / Skin Assessment (not related to wound area) 0 ASSESSMENTS - Ostomy and/or Continence Assessment and Care [] - Incontinence Assessment and Management 0 [] - 0 Ostomy Care Assessment and Management (repouching, etc.) PROCESS - Coordination of Care [] - Simple Patient / Family Education for ongoing care 0 [] - 0 Complex (extensive) Patient / Family Education for ongoing care [] - 0 Staff obtains Consents, Records, Test Results / Process Orders [] - 0 Staff telephones HHA, Nursing Homes / Clarify orders / etc [] - 0 Routine Transfer to another Facility (non-emergent condition) [] - 0 Routine Hospital Admission (non-emergent condition) [] - 0 New Admissions / Biomedical engineer / Ordering NPWT, Apligraf, etc. [] - 0 Emergency Hospital Admission (emergent condition) PROCESS - Special Needs [] - Pediatric / Minor Patient Management 0 [] - 0 Isolation Patient Management [] - 0 Hearing / Language / Visual special needs [] - 0 Assessment of Community assistance (transportation, D/C planning, etc.) [] - 0 Additional assistance / Altered mentation [] - 0 Support Surface(s) Assessment (bed, cushion, seat, etc.) INTERVENTIONS - Miscellaneous [] - External ear exam 0 [] - 0 Patient Transfer (multiple staff / Civil Service fast streamer / Similar devices) [] - 0 Simple Staple / Suture removal (25 or less) [] - 0 Complex Staple / Suture removal (26 or more) [] - 0 Hypo/Hyperglycemic Management (do not check if billed separately) [] - 0 Ankle / Brachial Index (ABI) - do not check if billed separately Has the patient been seen at the hospital within the last three years: Yes Total Score: 0 Level Of Care: ____ [name] ([id number]) Electronic Signature(s) Signed: 01/27/2021 6:24:05 PM By: Donnamarie Poag Entered By: Donnamarie Poag on 01/27/2021 15:26:35 Petsch, [name]  ([id number]) -------------------------------------------------------------------------------- Complex / Palliative Patient Assessment Details Patient Name: [name] Date of Service: 01/27/2021 3:00 PM Medical Record Number:  [id number] Patient Account Number: [id number] Date of Birth/Sex: September 14, 1927 (87 y.o. F) Treating RN: Donnamarie Poag Primary Care Roxie Gueye: Viviana Simpler Other Clinician: Referring Brees Hounshell: Viviana Simpler Treating Eva Vallee/Extender: Skipper Cliche in Treatment: 14 Palliative Management Criteria Complex Wound Management Criteria Patient has remarkable or complex co-morbidities requiring medications or treatments that extend wound healing times. Examples: o Diabetes mellitus with chronic renal failure or end stage renal disease requiring dialysis o Advanced or poorly controlled rheumatoid arthritis o Diabetes mellitus and end stage chronic obstructive pulmonary disease o Active cancer with current chemo- or radiation therapy non compliant for venous to keep feet elevated at home, cognitive memory concerns by family, offload Care Approach Wound Care Plan: Complex Wound Management Electronic Signature(s) Signed: 01/30/2021 9:30:48 AM By: Donnamarie Poag Signed: 01/30/2021 4:44:39 PM By: Worthy Keeler PA-C Previous Signature: 01/30/2021 9:30:36 AM Version By: Donnamarie Poag Entered By: Donnamarie Poag on 01/30/2021 09:30:48 Flammia, [name] ([id number]) -------------------------------------------------------------------------------- Compression Therapy Details Patient Name: [name] Date of Service: 01/27/2021 3:00 PM Medical Record Number: [id number] Patient Account Number: [id number] Date of Birth/Sex: 1928/04/28 (87 y.o. F) Treating RN: Donnamarie Poag Primary Care Ruben Mahler: Viviana Simpler Other Clinician: Referring Azaylah Stailey: Viviana Simpler Treating Kera Deacon/Extender: Skipper Cliche in Treatment: 14 Compression Therapy Performed for Wound Assessment:  Wound #2 Left Calcaneus Performed By: Clinician Donnamarie Poag, RN Compression Type: Three Layer Post Procedure Diagnosis Same as Pre-procedure Electronic Signature(s) Signed: 01/27/2021 6:24:05 PM By: Donnamarie Poag Entered By: Donnamarie Poag on 01/27/2021 15:31:06 Knickerbocker, [name] ([id number]) -------------------------------------------------------------------------------- Compression Therapy Details Patient Name: BENNETTE, HASTY Date of Service: 01/27/2021 3:00 PM Medical Record Number: [id number] Patient Account Number: [id number] Date of Birth/Sex: Dec 03, 1927 (87 y.o. F) Treating RN: Donnamarie Poag Primary Care Rakwon Letourneau: Viviana Simpler Other Clinician: Referring Krosby Ritchie: Viviana Simpler Treating Aela Bohan/Extender: Skipper Cliche in Treatment: 14 Compression Therapy Performed for Wound Assessment: Wound #1 Right,Lateral Malleolus Performed By: Junius Argyle, RN Compression Type: Three Layer Post Procedure Diagnosis Same as Pre-procedure Electronic Signature(s) Signed: 01/27/2021 6:24:05 PM By: Donnamarie Poag Entered By: Donnamarie Poag on 01/27/2021 15:31:21 Estill, [name] ([id number]) -------------------------------------------------------------------------------- Encounter Discharge Information Details Patient Name: EMORI, MUMME Date of Service: 01/27/2021 3:00 PM Medical Record Number: [id number] Patient Account Number: [id number] Date of Birth/Sex: 1927/12/14 (87 y.o. F) Treating RN: Donnamarie Poag Primary Care Akeira Lahm: Viviana Simpler Other Clinician: Referring Galvin Aversa: Viviana Simpler Treating Juanya Villavicencio/Extender: Skipper Cliche in Treatment: 14 Encounter Discharge Information Items Post Procedure Vitals Discharge Condition: Stable Temperature (F): 98.1 Ambulatory Status: Walker Pulse (bpm): 74 Discharge Destination: Home Respiratory Rate (breaths/min): 16 Transportation: Private Auto Blood Pressure (mmHg): 152/81 Accompanied By: daughter Schedule Follow-up  Appointment: Yes Clinical Summary of Care: Electronic Signature(s) Signed: 01/27/2021 6:24:05 PM By: Donnamarie Poag Entered By: Donnamarie Poag on 01/27/2021 15:44:39 Toth, [name] ([id number]) -------------------------------------------------------------------------------- Lower Extremity Assessment Details Patient Name: AARYA, ROBINSON Date of Service: 01/27/2021 3:00 PM Medical Record Number: [id number] Patient Account Number: [id number] Date of Birth/Sex: 1927/11/15 (87 y.o. F) Treating RN: Donnamarie Poag Primary Care Drelyn Pistilli: Viviana Simpler Other Clinician: Referring Nella Botsford: Viviana Simpler Treating Sy Saintjean/Extender: Skipper Cliche in Treatment: 14 Edema Assessment Assessed: Shirlyn Goltz: Yes] Patrice Paradise: Yes] [Left: Edema] [Right: :] Calf Left: Right: Point of Measurement: 33 cm From Medial Instep 21.5 cm 21.5 cm Ankle Left: Right: Point of Measurement: 9 cm From Medial Instep 17.8 cm 17.8 cm Electronic Signature(s) Signed: 01/27/2021 6:24:05 PM By: Donnamarie Poag Entered By: Donnamarie Poag on 01/27/2021 15:14:05 Stankus, [name] ([id number]) -------------------------------------------------------------------------------- Multi Wound Chart Details Patient Name:  Rase, [name] Date of Service: 01/27/2021 3:00 PM Medical Record Number: [id number] Patient Account Number: [id number] Date of Birth/Sex: 1928-01-29 (87 y.o. F) Treating RN: Donnamarie Poag Primary Care Provider: Viviana Simpler Other Clinician: Referring Provider: Viviana Simpler Treating Provider/Extender: Skipper Cliche in Treatment: 14 Vital Signs Height(in): 41 Pulse(bpm): 39 Weight(lbs): 80 Blood Pressure(mmHg): 152/81 Body Mass Index(BMI): 12 Temperature(F): 98.1 Respiratory Rate(breaths/min): 16 Photos: Wound Location: Right, Lateral Malleolus Left Calcaneus Right Ischial Tuberosity Wounding Event: Gradually Appeared Gradually Appeared Blister Primary Etiology: Pressure Ulcer Pressure Ulcer Pressure Ulcer Comorbid  History: Arrhythmia, Coronary Artery Arrhythmia, Coronary Artery Arrhythmia, Coronary Artery Disease, Hypertension, Peripheral Disease, Hypertension, Peripheral Disease, Hypertension, Peripheral Arterial Disease, History of pressure Arterial Disease, History of pressure Arterial Disease, History of pressure wounds, Osteoarthritis wounds, Osteoarthritis wounds, Osteoarthritis Date Acquired: 10/22/2014 07/09/2020 01/16/2021 Weeks of Treatment: 14 14 0 Wound Status: Open Open Open Measurements L x W x D (cm) 1.1x1x0.1 0.7x0.5x0.2 1.8x2.1x0.1 Area (cm) : 0.864 0.275 2.969 Volume (cm) : 0.086 0.055 0.297 % Reduction in Area: -10.10% 82.00% 0.00% % Reduction in Volume: -8.90% 64.10% 0.00% Classification: Category/Stage III Unstageable/Unclassified Category/Stage III Exudate Amount: Medium Medium Medium Exudate Type: Serous Serous Serosanguineous Exudate Color: amber amber red, brown Wound Margin: Flat and Intact N/A N/A Granulation Amount: Medium (34-66%) Medium (34-66%) Large (67-100%) Granulation Quality: Red, Pink Red, Pink Pink Necrotic Amount: Medium (34-66%) Medium (34-66%) Small (1-33%) Exposed Structures: Fat Layer (Subcutaneous Tissue): Fat Layer (Subcutaneous Tissue): Fat Layer (Subcutaneous Tissue): Yes Yes Yes Fascia: No Fascia: No Fascia: No Tendon: No Tendon: No Tendon: No Muscle: No Muscle: No Muscle: No Joint: No Joint: No Joint: No Bone: No Bone: No Bone: No Epithelialization: None None N/A Treatment Notes Electronic Signature(s) Signed: 01/27/2021 6:24:05 PM By: Donnamarie Poag Entered By: Donnamarie Poag on 01/27/2021 15:19:49 Belger, [name] ([id number]) -------------------------------------------------------------------------------- [place] Details Patient Name: JURNEY, OVERACKER Date of Service: 01/27/2021 3:00 PM Medical Record Number: [id number] Patient Account Number: [id number] Date of Birth/Sex: 03-03-1928 (87 y.o. F) Treating RN: Donnamarie Poag Primary Care Provider: Viviana Simpler Other Clinician: Referring Provider: Viviana Simpler Treating Provider/Extender: Skipper Cliche in Treatment: 14 Active Inactive Wound/Skin Impairment Nursing Diagnoses: Impaired tissue integrity Goals: Patient/caregiver will verbalize understanding of skin care regimen Date Initiated: 10/21/2020 Date Inactivated: 12/23/2020 Target Resolution Date: 12/21/2020 Goal Status: Met Ulcer/skin breakdown will have a volume reduction of 30% by week 4 Date Initiated: 10/21/2020 Date Inactivated: 11/25/2020 Target Resolution Date: 11/20/2020 Goal Status: Met Ulcer/skin breakdown will have a volume reduction of 50% by week 8 Date Initiated: 10/21/2020 Date Inactivated: 12/23/2020 Target Resolution Date: 12/21/2020 Goal Status: Met Ulcer/skin breakdown will have a volume reduction of 80% by week 12 Date Initiated: 10/21/2020 Target Resolution Date: 01/20/2021 Goal Status: Active Ulcer/skin breakdown will heal within 14 weeks Date Initiated: 10/21/2020 Target Resolution Date: 02/20/2021 Goal Status: Active Interventions: Assess patient/caregiver ability to obtain necessary supplies Assess patient/caregiver ability to perform ulcer/skin care regimen upon admission and as needed Assess ulceration(s) every visit Notes: Electronic Signature(s) Signed: 01/27/2021 6:24:05 PM By: Donnamarie Poag Entered By: Donnamarie Poag on 01/27/2021 15:19:40 Schuelke, [name] ([id number]) -------------------------------------------------------------------------------- Pain Assessment Details Patient Name: [name] Date of Service: 01/27/2021 3:00 PM Medical Record Number: [id number] Patient Account Number: [id number] Date of Birth/Sex: 1928/05/11 (87 y.o. F) Treating RN: Donnamarie Poag Primary Care Provider: Viviana Simpler Other Clinician: Referring Provider: Viviana Simpler Treating Provider/Extender: Skipper Cliche in Treatment: 14 Active Problems Location of Pain  Severity and Description of Pain Patient Has Paino  No Site Locations Rate the pain. Current Pain Level: 0 Pain Management and Medication Current Pain Management: Electronic Signature(s) Signed: 01/27/2021 6:24:05 PM By: Donnamarie Poag Entered By: Donnamarie Poag on 01/27/2021 15:04:03 [name] ([id number]) -------------------------------------------------------------------------------- Patient/Caregiver Education Details Patient Name: [name] Date of Service: 01/27/2021 3:00 PM Medical Record Number: [id number] Patient Account Number: [id number] Date of Birth/Gender: 12/22/1927 (87 y.o. F) Treating RN: Donnamarie Poag Primary Care Physician: Viviana Simpler Other Clinician: Referring Physician: Viviana Simpler Treating Physician/Extender: Skipper Cliche in Treatment: 14 Education Assessment Education Provided To: Patient and Caregiver Education Topics Provided Basic Hygiene: Wound/Skin Impairment: Engineer, maintenance) Signed: 01/27/2021 6:24:05 PM By: Donnamarie Poag Entered By: Donnamarie Poag on 01/27/2021 15:32:26 Bibian, [name] ([id number]) -------------------------------------------------------------------------------- Wound Assessment Details Patient Name: LEAHNA, HEWSON Date of Service: 01/27/2021 3:00 PM Medical Record Number: [id number] Patient Account Number: [id number] Date of Birth/Sex: 06-29-1928 (87 y.o. F) Treating RN: Donnamarie Poag Primary Care Provider: Viviana Simpler Other Clinician: Referring Provider: Viviana Simpler Treating Provider/Extender: Skipper Cliche in Treatment: 14 Wound Status Wound Number: 1 Primary Pressure Ulcer Etiology: Wound Location: Right, Lateral Malleolus Wound Open Wounding Event: Gradually Appeared Status: Date Acquired: 10/22/2014 Comorbid Arrhythmia, Coronary Artery Disease, Hypertension, Weeks Of Treatment: 14 History: Peripheral Arterial Disease, History of pressure wounds, Clustered Wound:  No Osteoarthritis Photos Wound Measurements Length: (cm) 1.1 Width: (cm) 1 Depth: (cm) 0.1 Area: (cm) 0.864 Volume: (cm) 0.086 % Reduction in Area: -10.1% % Reduction in Volume: -8.9% Epithelialization: None Tunneling: No Undermining: No Wound Description Classification: Category/Stage III Wound Margin: Flat and Intact Exudate Amount: Medium Exudate Type: Serous Exudate Color: amber Foul Odor After Cleansing: No Slough/Fibrino Yes Wound Bed Granulation Amount: Medium (34-66%) Exposed Structure Granulation Quality: Red, Pink Fascia Exposed: No Necrotic Amount: Medium (34-66%) Fat Layer (Subcutaneous Tissue) Exposed: Yes Necrotic Quality: Adherent Slough Tendon Exposed: No Muscle Exposed: No Joint Exposed: No Bone Exposed: No Treatment Notes Wound #1 (Malleolus) Wound Laterality: Right, Lateral Cleanser Soap and Water Discharge Instruction: Gently cleanse wound with antibacterial soap, rinse and pat dry prior to dressing wounds SHARLEY, KEELER ([id number]) Peri-Wound Care Topical Primary Dressing Prisma 4.34 (in) Discharge Instruction: Moisten w/normal saline or sterile water; Cover wound as directed. Do not remove from wound bed. Secondary Dressing Drawtex Hydroconductive Wound Dressing,o2x2 (in/in) Discharge Instruction: Apply to wound bed on top of collagen, then cover with larger piece of drawtexwith drainage. Secured With Compression Wrap Profore Lite LF 3 Multilayer Compression Bandaging System Discharge Instruction: Apply 3 multi-layer wrap as prescribed. CoFlex TLC Lite 2Layer Compression System, 25 to 30 mmHg Compression Stockings Add-Ons Electronic Signature(s) Signed: 01/27/2021 6:24:05 PM By: Donnamarie Poag Entered By: Donnamarie Poag on 01/27/2021 15:06:08 [name] ([id number]) -------------------------------------------------------------------------------- Wound Assessment Details Patient Name: SANIYA, TRANCHINA Date of Service: 01/27/2021 3:00  PM Medical Record Number: [id number] Patient Account Number: [id number] Date of Birth/Sex: [birth year] (87 y.o. F) Treating RN: Donnamarie Poag Primary Care Provider: Viviana Simpler Other Clinician: Referring Provider: Viviana Simpler Treating Provider/Extender: Skipper Cliche in Treatment: 14 Wound Status Wound Number: 2 Primary Pressure Ulcer Etiology: Wound Location: Left Calcaneus Wound Open Wounding Event: Gradually Appeared Status: Date Acquired: 07/09/2020 Comorbid Arrhythmia, Coronary Artery Disease, Hypertension, Weeks Of Treatment: 14 History: Peripheral Arterial Disease, History of pressure wounds, Clustered Wound: No Osteoarthritis Photos Wound Measurements Length: (cm) 0.7 Width: (cm) 0.5 Depth: (cm) 0.2 Area: (cm) 0.275 Volume: (cm) 0.055 % Reduction in Area: 82% % Reduction in Volume: 64.1% Epithelialization: None Tunneling: No Undermining: No Wound Description Classification: Unstageable/Unclassified  Exudate Amount: Medium Exudate Type: Serous Exudate Color: amber Foul Odor After Cleansing: No Slough/Fibrino Yes Wound Bed Granulation Amount: Medium (34-66%) Exposed Structure Granulation Quality: Red, Pink Fascia Exposed: No Necrotic Amount: Medium (34-66%) Fat Layer (Subcutaneous Tissue) Exposed: Yes Necrotic Quality: Adherent Slough Tendon Exposed: No Muscle Exposed: No Joint Exposed: No Bone Exposed: No Treatment Notes Wound #2 (Calcaneus) Wound Laterality: Left Cleanser Soap and Water Discharge Instruction: Gently cleanse wound with antibacterial soap, rinse and pat dry prior to dressing wounds Peri-Wound Care CATLYNN, GRONDAHL ([id number]) Topical Primary Dressing Prisma 4.34 (in) Discharge Instruction: Moisten w/normal saline or sterile water; Cover wound as directed. Do not remove from wound bed. Secondary Dressing Drawtex Hydroconductive Wound Dressing,o2x2 (in/in) Discharge Instruction: Apply to wound bed on top of collagen, then  cover with larger piece of drawtexwith drainage. Secured With Compression Wrap Profore Lite LF 3 Multilayer Compression Bandaging System Discharge Instruction: Apply 3 multi-layer wrap as prescribed. CoFlex TLC Lite 2Layer Compression System, 25 to 30 mmHg Compression Stockings Add-Ons Electronic Signature(s) Signed: 01/27/2021 6:24:05 PM By: Donnamarie Poag Entered By: Donnamarie Poag on 01/27/2021 15:07:04 [place], [name] ([id number]) -------------------------------------------------------------------------------- Wound Assessment Details Patient Name: JALAYA, SARVER Date of Service: 01/27/2021 3:00 PM Medical Record Number: [id number] Patient Account Number: [id number] Date of Birth/Sex: June 13, 1928 (87 y.o. F) Treating RN: Donnamarie Poag Primary Care Provider: Viviana Simpler Other Clinician: Referring Provider: Viviana Simpler Treating Provider/Extender: Skipper Cliche in Treatment: 14 Wound Status Wound Number: 4 Primary Pressure Ulcer Etiology: Wound Location: Right Ischial Tuberosity Wound Open Wounding Event: Blister Status: Date Acquired: 01/16/2021 Comorbid Arrhythmia, Coronary Artery Disease, Hypertension, Weeks Of Treatment: 0 History: Peripheral Arterial Disease, History of pressure wounds, Clustered Wound: No Osteoarthritis Photos Wound Measurements Length: (cm) 1.8 Width: (cm) 2.1 Depth: (cm) 0.1 Area: (cm) 2.969 Volume: (cm) 0.297 % Reduction in Area: 0% % Reduction in Volume: 0% Tunneling: No Undermining: No Wound Description Classification: Category/Stage III Exudate Amount: Medium Exudate Type: Serosanguineous Exudate Color: red, brown Foul Odor After Cleansing: No Slough/Fibrino Yes Wound Bed Granulation Amount: Large (67-100%) Exposed Structure Granulation Quality: Pink Fascia Exposed: No Necrotic Amount: Small (1-33%) Fat Layer (Subcutaneous Tissue) Exposed: Yes Necrotic Quality: Adherent Slough Tendon Exposed: No Muscle Exposed: No Joint  Exposed: No Bone Exposed: No Treatment Notes Wound #4 (Ischial Tuberosity) Wound Laterality: Right Cleanser Soap and Water Discharge Instruction: Gently cleanse wound with antibacterial soap, rinse and pat dry prior to dressing wounds Peri-Wound Care KANDIE, KEIPER ([id number]) Topical Primary Dressing Prisma 4.34 (in) Discharge Instruction: Moisten w/normal saline or sterile water; Cover wound as directed. Do not remove from wound bed. Secondary Dressing Mepilex Border Flex, 4x4 (in/in) Discharge Instruction: Apply to wound as directed. Do not cut. Secured With Compression Wrap Compression Stockings Add-Ons Electronic Signature(s) Signed: 01/27/2021 6:24:05 PM By: Donnamarie Poag Entered By: Donnamarie Poag on 01/27/2021 15:11:24 Tobler, [name] ([id number]) -------------------------------------------------------------------------------- [place] Details Patient Name: [name] Date of Service: 01/27/2021 3:00 PM Medical Record Number: [id number] Patient Account Number: [id number] Date of Birth/Sex: [birth year] (87 y.o. F) Treating RN: Donnamarie Poag Primary Care Provider: Viviana Simpler Other Clinician: Referring Provider: Viviana Simpler Treating Provider/Extender: Skipper Cliche in Treatment: 14 Vital Signs Time Taken: 14:55 Temperature (F): 98.1 Height (in): 66 Pulse (bpm): 74 Weight (lbs): 76 Respiratory Rate (breaths/min): 16 Body Mass Index (BMI): 12.3 Blood Pressure (mmHg): 152/81 Reference Range: 80 - 120 mg / dl Electronic Signature(s) Signed: 01/27/2021 6:24:05 PM By: Donnamarie Poag Entered ByDonnamarie Poag on 01/27/2021 15:03:55

## 2021-01-27 NOTE — Progress Notes (Addendum)
Elizabeth, Downs ([id number]) Visit Report for 01/27/2021 Chief Complaint Document Details Patient Name: [name], [name]. Date of Service: 01/27/2021 3:00 PM Medical Record Number: [id number] Patient Account Number: [id number] Date of Birth/Sex: [birth year] (87 y.o. F) Treating RN: Donnamarie Poag Primary Care Provider: Viviana Simpler Other Clinician: Referring Provider: Viviana Simpler Treating Provider/Extender: Skipper Cliche in Treatment: 14 Information Obtained from: Patient Chief Complaint Pressure ulcer right ankle and left heel and right ischial/buttock pressure ulcer Electronic Signature(s) Signed: 01/27/2021 5:44:13 PM By: Worthy Keeler PA-C Previous Signature: 01/27/2021 2:33:04 PM Version By: Worthy Keeler PA-C Entered By: Worthy Keeler on 01/27/2021 17:44:12 Moody, [name] ([id number]) -------------------------------------------------------------------------------- Debridement Details Patient Name: [name] Date of Service: 01/27/2021 3:00 PM Medical Record Number: [id number] Patient Account Number: [id number] Date of Birth/Sex: 1928-04-10 (87 y.o. F) Treating RN: Donnamarie Poag Primary Care Provider: Viviana Simpler Other Clinician: Referring Provider: Viviana Simpler Treating Provider/Extender: Skipper Cliche in Treatment: 14 Debridement Performed for Wound #4 Right Ischial Tuberosity Assessment: Performed By: Physician Tommie Sams., PA-C Debridement Type: Chemical/Enzymatic/Mechanical Agent Used: saline gauze Level of Consciousness (Pre- Awake and Alert procedure): Pre-procedure Verification/Time Out Yes - 15:23 Taken: Start Time: 15:23 Pain Control: Lidocaine Instrument: Other : saline gauze Bleeding: None Response to Treatment: Procedure was tolerated well Level of Consciousness (Post- Awake and Alert procedure): Post Debridement Measurements of Total Wound Length: (cm) 1.8 Stage: Category/Stage III Width: (cm) 2.1 Depth: (cm)  0.1 Volume: (cm) 0.297 Character of Wound/Ulcer Post Debridement: Improved Post Procedure Diagnosis Same as Pre-procedure Electronic Signature(s) Signed: 01/27/2021 6:24:05 PM By: Donnamarie Poag Signed: 01/27/2021 6:45:55 PM By: Worthy Keeler PA-C Entered By: Donnamarie Poag on 01/27/2021 15:23:16 Pellicano, [name] ([id number]) -------------------------------------------------------------------------------- HPI Details Patient Name: [name] Date of Service: 01/27/2021 3:00 PM Medical Record Number: [id number] Patient Account Number: [id number] Date of Birth/Sex: 27-May-1928 (87 y.o. F) Treating RN: Donnamarie Poag Primary Care Provider: Viviana Simpler Other Clinician: Referring Provider: Viviana Simpler Treating Provider/Extender: Skipper Cliche in Treatment: 14 History of Present Illness HPI Description: 10/21/2020 upon evaluation today patient presents for initial inspection here in our clinic concerning issues that she has been having quite some time in regard to her ankle and since she has been in the hospital with regard to the heel. The ankle in fact has been 5-6 years at least I am told. With that being said the heel ulcer occurred when she was in the hospital in December for hip surgery when she fractured her hip. She also was in the hospital Tuesday for altered mental status. Really there was nothing that was identified as the cause for this. She did have a. Fortunately there does not appear to be any signs of infection she tells me that she has had she believes arterial studies At [facility] clinic I could not find Those studies at this point. Nonetheless I will continue to look and see what I can find before Then. The patient also has a skin tear on her arm this occurred more recently when she bumped this at home. The patient does have a history of hypertension, coronary artery disease, and peripheral vascular disease stated. 10/28/2020 I was able to find the patient's chart  currently which shows that she did have an arterial study performed in May 2019. This showed that she had a abnormal right toe brachial index and a normal left toe brachial index. She was noncompressible as far as ABIs were concerned. She did appear to have triphasic flow at  that time. Unfortunately the wound that is commented on in the report that I printed off and read mentions the same wound on the ankle that we are still dealing with at this point. Unfortunately this has not healed. And its been quite sometime about 3 years now. Fortunately there does not appear to be any signs of active infection systemically at this point. I think that the patient has done well with the Santyl over the past week which is good news. Patient's caregiver which is her daughter-in-law is concerned about the fact that she really does not feel qualified to be able to change the dressings and take care of this issue. There does not appear to be any signs of anything untoward going on at this point. I think she is done a great job applying the [employer] and I think that has done a great job for the patient is for soften up some of the necrotic tissue. With that being said I think the Xeroform on the arm also has done excellent. In general I am very pleased with where we stand. And I told the patient's daughter-in- law as well that I also feel like she has done a great job over the past week taking care of her mother. 11/11/2020 upon evaluation today patient appears to be doing well with regard to her wounds. She has been tolerating the dressing changes without complication her daughters been applying the Santyl which has done a great job. Ho with that being said I think now that we have good arterial study showing we can go ahead and proceed with sharp debridement at this point. 11/25/2020 upon evaluation today patient appears to be doing well with regard to her wounds. The Santyl has really helped to clean things up  and overall she is doing quite excellent at this point. Fortunately there does not appear to be any signs of infection which is great news and in general extremely pleased. I do believe some debridement is in order and hopefully will be able to get her into a collagen dressing after this. 12/23/2020 upon evaluation today patient appears to be doing well with regard to her wound all things considered. Unfortunately it does sound like her daughter decided to let this air out because she felt like it was getting so wet. It sounds like she got border gauze dressings instead of border foam therefore there is really Nothing to catch the excess drainage which I think is the problem they were worried about the smell. Fortunately there does not appear to be any signs of active infection which is great news. Nonetheless I do not think we want to leave this just open to air. 01/13/2021 upon evaluation today patient's wounds actually appear to be about as good as have seen since have been taking care of her. Fortunately there does not appear to be any evidence of infection which is great and overall the biggest issue I see is that of fluid buildup. I think we need to do something to try to help manage this. I think if we can control her edema we get the wounds to heal more effectively. 01/27/2021 upon evaluation today patient appears to be doing well in regard to the wounds on her right lateral malleolus as well as the left heel and the right ischial tuberosity is unfortunately a new area that has arisen since we last saw her. She tells me currently that that is from sitting too much. With that being said this actually appears  to be doing the worst of anything so far that I see today. Fortunately there does not appear to be any signs of infection this is at least good news. Compression wrap seem to be doing excellent for her as far as the legs are concerned. Electronic Signature(s) Signed: 01/27/2021 5:44:23 PM By:  Worthy Keeler PA-C Entered By: Worthy Keeler on 01/27/2021 17:44:23 Mittleman, [name] ([id number]) -------------------------------------------------------------------------------- Physical Exam Details Patient Name: FALIN, ADERMAN Date of Service: 01/27/2021 3:00 PM Medical Record Number: [id number] Patient Account Number: [id number] Date of Birth/Sex: 07/06/1928 (87 y.o. F) Treating RN: Donnamarie Poag Primary Care Provider: Viviana Simpler Other Clinician: Referring Provider: Viviana Simpler Treating Provider/Extender: Skipper Cliche in Treatment: 57 Constitutional Well-nourished and well-hydrated in no acute distress. Respiratory normal breathing without difficulty. Psychiatric this patient is able to make decisions and demonstrates good insight into disease process. Alert and Oriented x 3. pleasant and cooperative. Notes Upon inspection patient's wound bed actually showed signs of good granulation epithelization at this point. There does not appear to be any evidence of active infection which is great news and overall I am extremely pleased with where things stand today. No fevers, chills, nausea, vomiting, or diarrhea. Electronic Signature(s) Signed: 01/27/2021 5:45:00 PM By: Worthy Keeler PA-C Entered By: Worthy Keeler on 01/27/2021 17:45:00 Garabedian, [name] ([id number]) -------------------------------------------------------------------------------- Physician Orders Details Patient Name: IRANIA, NAEVE Date of Service: 01/27/2021 3:00 PM Medical Record Number: [id number] Patient Account Number: [id number] Date of Birth/Sex: May 24, 1928 (87 y.o. F) Treating RN: Donnamarie Poag Primary Care Provider: Viviana Simpler Other Clinician: Referring Provider: Viviana Simpler Treating Provider/Extender: Skipper Cliche in Treatment: 14 Verbal / Phone Orders: No Diagnosis Coding ICD-10 Coding Code Description L89.513 Pressure ulcer of right ankle, stage 3 L89.623 Pressure ulcer  of left heel, stage 3 S51.802A Unspecified open wound of left forearm, initial encounter I10 Essential (primary) hypertension I25.10 Atherosclerotic heart disease of native coronary artery without angina pectoris I73.89 Other specified peripheral vascular diseases Follow-up Appointments o Return Appointment in 1 week. - May do nurse visit to allow weekly visits if needed o Nurse Visit as needed Bathing/ Shower/ Hygiene o May shower with wound dressing protected with water repellent cover or cast protector. o No tub bath. Edema Control - Lymphedema / Segmental Compressive Device / Other o Optional: One layer of unna paste to top of compression wrap (to act as an anchor). o Elevate legs to the level of the heart and pump ankles as often as possible o Elevate leg(s) parallel to the floor when sitting. o DO YOUR BEST to sleep in the bed at night. DO NOT sleep in your recliner. Long hours of sitting in a recliner leads to swelling of the legs and/or potential wounds on your backside. Off-Loading o Gel wheelchair cushion - prop when sitting or laying to avoid pressure on your hip wound o Turn and reposition every 2 hours Wound Treatment Wound #1 - Malleolus Wound Laterality: Right, Lateral Cleanser: Soap and Water 1 x Per Day/30 Days Discharge Instructions: Gently cleanse wound with antibacterial soap, rinse and pat dry prior to dressing wounds Primary Dressing: Prisma 4.34 (in) (Generic) 1 x Per Day/30 Days Discharge Instructions: Moisten w/normal saline or sterile water; Cover wound as directed. Do not remove from wound bed. Secondary Dressing: Drawtex Hydroconductive Wound Dressing,o2x2 (in/in) 1 x Per Day/30 Days Discharge Instructions: Apply to wound bed on top of collagen, then cover with larger piece of drawtexwith drainage. Compression Wrap: Profore Lite LF 3  Multilayer Compression Bandaging System 1 x Per Day/30 Days Discharge Instructions: Apply 3 multi-layer wrap  as prescribed. Compression Wrap: CoFlex TLC Lite 2Layer Compression System, 25 to 30 mmHg 1 x Per Day/30 Days Wound #2 - Calcaneus Wound Laterality: Left Cleanser: Soap and Water 1 x Per Day/30 Days Discharge Instructions: Gently cleanse wound with antibacterial soap, rinse and pat dry prior to dressing wounds Primary Dressing: Prisma 4.34 (in) (Generic) 1 x Per Day/30 Days Discharge Instructions: Moisten w/normal saline or sterile water; Cover wound as directed. Do not remove from wound bed. Secondary Dressing: Drawtex Hydroconductive Wound Dressing,o2x2 (in/in) 1 x Per Day/30 Days Discharge Instructions: Apply to wound bed on top of collagen, then cover with larger piece of drawtexwith drainage. FRANCA, DAMOUR ([id number]) Compression Wrap: Profore Lite LF 3 Multilayer Compression Bandaging System 1 x Per Day/30 Days Discharge Instructions: Apply 3 multi-layer wrap as prescribed. Compression Wrap: CoFlex TLC Lite 2Layer Compression System, 25 to 30 mmHg 1 x Per Day/30 Days Wound #4 - Ischial Tuberosity Wound Laterality: Right Cleanser: Soap and Water 3 x Per Week/30 Days Discharge Instructions: Gently cleanse wound with antibacterial soap, rinse and pat dry prior to dressing wounds Primary Dressing: Prisma 4.34 (in) (DME) (Generic) 3 x Per Week/30 Days Discharge Instructions: Moisten w/normal saline or sterile water; Cover wound as directed. Do not remove from wound bed. Secondary Dressing: Mepilex Border Flex, 4x4 (in/in) (DME) (Generic) 3 x Per Week/30 Days Discharge Instructions: Apply to wound as directed. Do not cut. Electronic Signature(s) Signed: 01/27/2021 6:24:05 PM By: Donnamarie Poag Signed: 01/27/2021 6:45:55 PM By: Worthy Keeler PA-C Entered By: Donnamarie Poag on 01/27/2021 15:26:14 Eckrich, [name] ([id number]) -------------------------------------------------------------------------------- Problem List Details Patient Name: ZAREENA, YAU Date of Service: 01/27/2021 3:00  PM Medical Record Number: [id number] Patient Account Number: [id number] Date of Birth/Sex: 03/11/1928 (87 y.o. F) Treating RN: Donnamarie Poag Primary Care Provider: Viviana Simpler Other Clinician: Referring Provider: Viviana Simpler Treating Provider/Extender: Skipper Cliche in Treatment: 14 Active Problems ICD-10 Encounter Code Description Active Date MDM Diagnosis L89.513 Pressure ulcer of right ankle, stage 3 10/21/2020 No Yes L89.623 Pressure ulcer of left heel, stage 3 10/21/2020 No Yes S51.802A Unspecified open wound of left forearm, initial encounter 10/21/2020 No Yes L89.312 Pressure ulcer of right buttock, stage 2 01/27/2021 No Yes I10 Essential (primary) hypertension 10/21/2020 No Yes I25.10 Atherosclerotic heart disease of native coronary artery without angina 10/21/2020 No Yes pectoris I73.89 Other specified peripheral vascular diseases 10/21/2020 No Yes Inactive Problems Resolved Problems Electronic Signature(s) Signed: 01/27/2021 5:43:42 PM By: Worthy Keeler PA-C Previous Signature: 01/27/2021 2:32:58 PM Version By: Worthy Keeler PA-C Entered By: Worthy Keeler on 01/27/2021 17:43:42 Liddell, [name] ([id number]) -------------------------------------------------------------------------------- Progress Note Details Patient Name: [name] Date of Service: 01/27/2021 3:00 PM Medical Record Number: [id number] Patient Account Number: [id number] Date of Birth/Sex: 17-Mar-1928 (87 y.o. F) Treating RN: Donnamarie Poag Primary Care Provider: Viviana Simpler Other Clinician: Referring Provider: Viviana Simpler Treating Provider/Extender: Skipper Cliche in Treatment: 14 Subjective Chief Complaint Information obtained from Patient Pressure ulcer right ankle and left heel and right ischial/buttock pressure ulcer History of Present Illness (HPI) 10/21/2020 upon evaluation today patient presents for initial inspection here in our clinic concerning issues that she has been  having quite some time in regard to her ankle and since she has been in the hospital with regard to the heel. The ankle in fact has been 5-6 years at least I am told. With that being said the  heel ulcer occurred when she was in the hospital in December for hip surgery when she fractured her hip. She also was in the hospital Tuesday for altered mental status. Really there was nothing that was identified as the cause for this. She did have a. Fortunately there does not appear to be any signs of infection she tells me that she has had she believes arterial studies At [facility] clinic I could not find Those studies at this point. Nonetheless I will continue to look and see what I can find before Then. The patient also has a skin tear on her arm this occurred more recently when she bumped this at home. The patient does have a history of hypertension, coronary artery disease, and peripheral vascular disease stated. 10/28/2020 I was able to find the patient's chart currently which shows that she did have an arterial study performed in May 2019. This showed that she had a abnormal right toe brachial index and a normal left toe brachial index. She was noncompressible as far as ABIs were concerned. She did appear to have triphasic flow at that time. Unfortunately the wound that is commented on in the report that I printed off and read mentions the same wound on the ankle that we are still dealing with at this point. Unfortunately this has not healed. And its been quite sometime about 3 years now. Fortunately there does not appear to be any signs of active infection systemically at this point. I think that the patient has done well with the Santyl over the past week which is good news. Patient's caregiver which is her daughter-in-law is concerned about the fact that she really does not feel qualified to be able to change the dressings and take care of this issue. There does not appear to be any signs of anything  untoward going on at this point. I think she is done a great job applying the [employer] and I think that has done a great job for the patient is for soften up some of the necrotic tissue. With that being said I think the Xeroform on the arm also has done excellent. In general I am very pleased with where we stand. And I told the patient's daughter-in- law as well that I also feel like she has done a great job over the past week taking care of her mother. 11/11/2020 upon evaluation today patient appears to be doing well with regard to her wounds. She has been tolerating the dressing changes without complication her daughters been applying the Santyl which has done a great job. Ho with that being said I think now that we have good arterial study showing we can go ahead and proceed with sharp debridement at this point. 11/25/2020 upon evaluation today patient appears to be doing well with regard to her wounds. The Santyl has really helped to clean things up and overall she is doing quite excellent at this point. Fortunately there does not appear to be any signs of infection which is great news and in general extremely pleased. I do believe some debridement is in order and hopefully will be able to get her into a collagen dressing after this. 12/23/2020 upon evaluation today patient appears to be doing well with regard to her wound all things considered. Unfortunately it does sound like her daughter decided to let this air out because she felt like it was getting so wet. It sounds like she got border gauze dressings instead of border foam therefore there is really  Nothing to catch the excess drainage which I think is the problem they were worried about the smell. Fortunately there does not appear to be any signs of active infection which is great news. Nonetheless I do not think we want to leave this just open to air. 01/13/2021 upon evaluation today patient's wounds actually appear to be about as good as have  seen since have been taking care of her. Fortunately there does not appear to be any evidence of infection which is great and overall the biggest issue I see is that of fluid buildup. I think we need to do something to try to help manage this. I think if we can control her edema we get the wounds to heal more effectively. 01/27/2021 upon evaluation today patient appears to be doing well in regard to the wounds on her right lateral malleolus as well as the left heel and the right ischial tuberosity is unfortunately a new area that has arisen since we last saw her. She tells me currently that that is from sitting too much. With that being said this actually appears to be doing the worst of anything so far that I see today. Fortunately there does not appear to be any signs of infection this is at least good news. Compression wrap seem to be doing excellent for her as far as the legs are concerned. Objective Constitutional Well-nourished and well-hydrated in no acute distress. [name] ([id number]) Vitals Time Taken: 2:55 PM, Height: 66 in, Weight: 76 lbs, BMI: 12.3, Temperature: 98.1 F, Pulse: 74 bpm, Respiratory Rate: 16 breaths/min, Blood Pressure: 152/81 mmHg. Respiratory normal breathing without difficulty. Psychiatric this patient is able to make decisions and demonstrates good insight into disease process. Alert and Oriented x 3. pleasant and cooperative. General Notes: Upon inspection patient's wound bed actually showed signs of good granulation epithelization at this point. There does not appear to be any evidence of active infection which is great news and overall I am extremely pleased with where things stand today. No fevers, chills, nausea, vomiting, or diarrhea. Integumentary (Hair, Skin) Wound #1 status is Open. Original cause of wound was Gradually Appeared. The date acquired was: 10/22/2014. The wound has been in treatment 14 weeks. The wound is located on the Right,Lateral  Malleolus. The wound measures 1.1cm length x 1cm width x 0.1cm depth; 0.864cm^2 area and 0.086cm^3 volume. There is Fat Layer (Subcutaneous Tissue) exposed. There is no tunneling or undermining noted. There is a medium amount of serous drainage noted. The wound margin is flat and intact. There is medium (34-66%) red, pink granulation within the wound bed. There is a medium (34-66%) amount of necrotic tissue within the wound bed including Adherent Slough. Wound #2 status is Open. Original cause of wound was Gradually Appeared. The date acquired was: 07/09/2020. The wound has been in treatment 14 weeks. The wound is located on the Left Calcaneus. The wound measures 0.7cm length x 0.5cm width x 0.2cm depth; 0.275cm^2 area and 0.055cm^3 volume. There is Fat Layer (Subcutaneous Tissue) exposed. There is no tunneling or undermining noted. There is a medium amount of serous drainage noted. There is medium (34-66%) red, pink granulation within the wound bed. There is a medium (34-66%) amount of necrotic tissue within the wound bed including Adherent Slough. Wound #4 status is Open. Original cause of wound was Blister. The date acquired was: 01/16/2021. The wound is located on the Right Ischial Tuberosity. The wound measures 1.8cm length x 2.1cm width x 0.1cm depth; 2.969cm^2 area  and 0.297cm^3 volume. There is Fat Layer (Subcutaneous Tissue) exposed. There is no tunneling or undermining noted. There is a medium amount of serosanguineous drainage noted. There is large (67-100%) pink granulation within the wound bed. There is a small (1-33%) amount of necrotic tissue within the wound bed including Adherent Slough. Assessment Active Problems ICD-10 Pressure ulcer of right ankle, stage 3 Pressure ulcer of left heel, stage 3 Unspecified open wound of left forearm, initial encounter Pressure ulcer of right buttock, stage 2 Essential (primary) hypertension Atherosclerotic heart disease of native coronary artery  without angina pectoris Other specified peripheral vascular diseases Procedures Wound #4 Pre-procedure diagnosis of Wound #4 is a Pressure Ulcer located on the Right Ischial Tuberosity . There was a Chemical/Enzymatic/Mechanical debridement performed by Tommie Sams., PA-C. With the following instrument(s): saline gauze after achieving pain control using Lidocaine. Other agent used was saline gauze. A time out was conducted at 15:23, prior to the start of the procedure. There was no bleeding. The procedure was tolerated well. Post Debridement Measurements: 1.8cm length x 2.1cm width x 0.1cm depth; 0.297cm^3 volume. Post debridement Stage noted as Category/Stage III. Character of Wound/Ulcer Post Debridement is improved. Post procedure Diagnosis Wound #4: Same as Pre-Procedure Wound #1 Pre-procedure diagnosis of Wound #1 is a Pressure Ulcer located on the Right,Lateral Malleolus . There was a Three Layer Compression Therapy Procedure by Donnamarie Poag, RN. Post procedure Diagnosis Wound #1: Same as Pre-Procedure Wound #2 Pre-procedure diagnosis of Wound #2 is a Pressure Ulcer located on the Left Calcaneus . There was a Three Layer Compression Therapy Procedure by Donnamarie Poag, RN. CONNY, JAVED ([id number]) Post procedure Diagnosis Wound #2: Same as Pre-Procedure Plan Follow-up Appointments: Return Appointment in 1 week. - May do nurse visit to allow weekly visits if needed Nurse Visit as needed Bathing/ Shower/ Hygiene: May shower with wound dressing protected with water repellent cover or cast protector. No tub bath. Edema Control - Lymphedema / Segmental Compressive Device / Other: Optional: One layer of unna paste to top of compression wrap (to act as an anchor). Elevate legs to the level of the heart and pump ankles as often as possible Elevate leg(s) parallel to the floor when sitting. DO YOUR BEST to sleep in the bed at night. DO NOT sleep in your recliner. Long hours of sitting  in a recliner leads to swelling of the legs and/or potential wounds on your backside. Off-Loading: Gel wheelchair cushion - prop when sitting or laying to avoid pressure on your hip wound Turn and reposition every 2 hours WOUND #1: - Malleolus Wound Laterality: Right, Lateral Cleanser: Soap and Water 1 x Per Day/30 Days Discharge Instructions: Gently cleanse wound with antibacterial soap, rinse and pat dry prior to dressing wounds Primary Dressing: Prisma 4.34 (in) (Generic) 1 x Per Day/30 Days Discharge Instructions: Moisten w/normal saline or sterile water; Cover wound as directed. Do not remove from wound bed. Secondary Dressing: Drawtex Hydroconductive Wound Dressing, 2x2 (in/in) 1 x Per Day/30 Days Discharge Instructions: Apply to wound bed on top of collagen, then cover with larger piece of drawtexwith drainage. Compression Wrap: Profore Lite LF 3 Multilayer Compression Bandaging System 1 x Per Day/30 Days Discharge Instructions: Apply 3 multi-layer wrap as prescribed. Compression Wrap: CoFlex TLC Lite 2Layer Compression System, 25 to 30 mmHg 1 x Per Day/30 Days WOUND #2: - Calcaneus Wound Laterality: Left Cleanser: Soap and Water 1 x Per Day/30 Days Discharge Instructions: Gently cleanse wound with antibacterial soap, rinse and pat dry prior  to dressing wounds Primary Dressing: Prisma 4.34 (in) (Generic) 1 x Per Day/30 Days Discharge Instructions: Moisten w/normal saline or sterile water; Cover wound as directed. Do not remove from wound bed. Secondary Dressing: Drawtex Hydroconductive Wound Dressing, 2x2 (in/in) 1 x Per Day/30 Days Discharge Instructions: Apply to wound bed on top of collagen, then cover with larger piece of drawtexwith drainage. Compression Wrap: Profore Lite LF 3 Multilayer Compression Bandaging System 1 x Per Day/30 Days Discharge Instructions: Apply 3 multi-layer wrap as prescribed. Compression Wrap: CoFlex TLC Lite 2Layer Compression System, 25 to 30 mmHg 1 x  Per Day/30 Days WOUND #4: - Ischial Tuberosity Wound Laterality: Right Cleanser: Soap and Water 3 x Per Week/30 Days Discharge Instructions: Gently cleanse wound with antibacterial soap, rinse and pat dry prior to dressing wounds Primary Dressing: Prisma 4.34 (in) (DME) (Generic) 3 x Per Week/30 Days Discharge Instructions: Moisten w/normal saline or sterile water; Cover wound as directed. Do not remove from wound bed. Secondary Dressing: Mepilex Border Flex, 4x4 (in/in) (DME) (Generic) 3 x Per Week/30 Days Discharge Instructions: Apply to wound as directed. Do not cut. 1. Would recommend currently that we going continue with the wound care measures as before with regard to her wounds on the ankle and foot. Subsequently I think that the silver collagen is doing a good job here. 2. Also can recommend silver collagen to the ischial location which I think is also ideal. We will use a border foam dressing over this area. 3. With regard to the leg submitted continue currently with the compression wrap. I do believe this is doing a great job and again we will continue as such. We will see patient back for reevaluation in 1 week here in the clinic. If anything worsens or changes patient will contact our office for additional recommendations. Electronic Signature(s) Signed: 01/27/2021 5:45:47 PM By: Worthy Keeler PA-C Entered By: Worthy Keeler on 01/27/2021 17:45:47 Goings, [name] ([id number]) -------------------------------------------------------------------------------- SuperBill Details Patient Name: DISIREE, SALTER Date of Service: 01/27/2021 Medical Record Number: [id number] Patient Account Number: [id number] Date of Birth/Sex: 10-Feb-1928 (87 y.o. F) Treating RN: Donnamarie Poag Primary Care Provider: Viviana Simpler Other Clinician: Referring Provider: Viviana Simpler Treating Provider/Extender: Skipper Cliche in Treatment: 14 Diagnosis Coding ICD-10 Codes Code Description [phone]  Pressure ulcer of right ankle, stage 3 L89.623 Pressure ulcer of left heel, stage 3 S51.802A Unspecified open wound of left forearm, initial encounter L89.312 Pressure ulcer of right buttock, stage 2 I10 Essential (primary) hypertension I25.10 Atherosclerotic heart disease of native coronary artery without angina pectoris I73.89 Other specified peripheral vascular diseases Facility Procedures CPT4: Description Modifier Quantity Code [id number] BILATERAL: Application of multi-layer venous compression system; leg (below knee), including 1 ankle and foot. Physician Procedures CPT4 Code: [id number] Description: [id number] - WC PHYS LEVEL 4 - EST PT Modifier: Quantity: 1 CPT4 Code: Description: ICD-10 Diagnosis Description L89.513 Pressure ulcer of right ankle, stage 3 L89.623 Pressure ulcer of left heel, stage 3 S51.802A Unspecified open wound of left forearm, initial encounter L89.312 Pressure ulcer of right buttock, stage 2 Modifier: Quantity: Electronic Signature(s) Signed: 01/27/2021 5:46:12 PM By: Worthy Keeler PA-C Entered By: Worthy Keeler on 01/27/2021 17:46:12

## 2021-01-31 DIAGNOSIS — E11621 Type 2 diabetes mellitus with foot ulcer: Secondary | ICD-10-CM | POA: Diagnosis not present

## 2021-02-01 ENCOUNTER — Telehealth: Payer: Self-pay

## 2021-02-01 NOTE — Chronic Care Management (AMB) (Addendum)
    Chronic Care Management Pharmacy Assistant   Name: [name]  MRN: [id number] DOB: [birth year]  Reason for Encounter: General Adherence   Recent office visits:  01/16/21 - Dr. Silvio Pate, PCP - Patient presented for 3 month follow up for pain medication management and skin lesion. Started silver sulfadiazine 1% apply topically daily. Follow up 3 months.  Recent consult visits:  11/04/20 - General Surgery - No data found  Hospital visits:  None since last CCM contact  Medications: Outpatient Encounter Medications as of 02/01/2021  Medication Sig   acyclovir (ZOVIRAX) 400 MG tablet TAKE 1 TABLET BY MOUTH 4 TIMES DAILY   azelastine (ASTELIN) 0.1 % nasal spray Place 2 sprays into both nostrils 2 (two) times daily.   fexofenadine (ALLEGRA) 180 MG tablet Take 180 mg by mouth daily.   fluticasone (FLONASE) 50 MCG/ACT nasal spray PLACE 2 SPRAYS INTO EACH NOSTRIL ONCE DAILY (Patient taking differently: Place 2 sprays into both nostrils daily.)   hydrocortisone 2.5 % cream Apply topically 3 (three) times daily as needed.   LORazepam (ATIVAN) 1 MG tablet TAKE 1/2 TO ONE TABLET BY MOUTH TWICE DAILY AS NEEDED FOR ANXIETY   morphine (MSIR) 15 MG tablet TAKE 1 TABLET BY MOUTH 3 TIMES DAILY AS NEEDED FOR PAIN   Multiple Vitamin (MULTIVITAMIN) tablet Take 1 tablet by mouth once daily   pantoprazole (PROTONIX) 40 MG tablet TAKE 1 TABLET BY MOUTH TWICE A DAY   polyethylene glycol (MIRALAX / GLYCOLAX) packet Take 17 g by mouth at bedtime.   RESTASIS 0.05 % ophthalmic emulsion Place 1 drop into both eyes 2 (two) times daily.   silver sulfADIAZINE (SILVADENE) 1 % cream Apply 1 application topically daily. On open area on buttock   topiramate (TOPAMAX) 50 MG tablet Take 1 tablet (50 mg total) by mouth at bedtime.   No facility-administered encounter medications on file as of 02/01/2021.    Attempted contact with [name] 3 times on 02/01/21, 02/02/21, 02/03/21. Unsuccessful outreach. Will attempt  contact next month.   Patient is not > 5 days past due for refill on the following medications per chart history:  Star Medications: Medication Name/mg Last Fill Days Supply  No star medications identified  Care Gaps: Last annual wellness visit: scheduled 05/2021  PCP appointment on 05/2021  Debbora Dus, CPP notified  Avel Sensor, [place] Assistant [phone]  I have reviewed the care management and care coordination activities outlined in this encounter and I am certifying that I agree with the content of this note. No further action required.  Debbora Dus, PharmD Clinical Pharmacist [facility] [phone]

## 2021-02-03 ENCOUNTER — Other Ambulatory Visit: Payer: Self-pay

## 2021-02-03 ENCOUNTER — Encounter: Payer: PPO | Admitting: Physician Assistant

## 2021-02-03 DIAGNOSIS — L89513 Pressure ulcer of right ankle, stage 3: Secondary | ICD-10-CM | POA: Diagnosis not present

## 2021-02-03 DIAGNOSIS — L89312 Pressure ulcer of right buttock, stage 2: Secondary | ICD-10-CM | POA: Diagnosis not present

## 2021-02-03 DIAGNOSIS — L89623 Pressure ulcer of left heel, stage 3: Secondary | ICD-10-CM | POA: Diagnosis not present

## 2021-02-04 NOTE — Progress Notes (Signed)
[name], [name] ([id number]) Visit Report for 02/03/2021 Arrival Information Details Patient Name: [name], [name] Date of Service: 02/03/2021 3:00 PM Medical Record Number: [id number] Patient Account Number: [id number] Date of Birth/Sex: [name] (87 y.o. F) Treating RN: [name] Primary Care [name]: [name] Other Clinician: Referring [name]: [name] Treating [name]/Extender: [name] in Treatment: 15 Visit Information History Since Last Visit Added or deleted any medications: No Patient Arrived: [name] Had a fall or experienced change in No Arrival Time: 14:38 activities of daily living that may affect Accompanied By: daughter in law risk of falls: Transfer Assistance: None Hospitalized since last visit: No Patient Identification Verified: Yes Has Dressing in Place as Prescribed: Yes Secondary Verification Process Completed: Yes Has Compression in Place as Prescribed: Yes Patient Has Alerts: Yes Pain Present Now: No Patient Alerts: NOT DIABETIC TBI left .77 TBI right .76 Electronic Signature(s) Signed: 02/03/2021 4:28:42 PM By: [name] Entered By: [name] on 02/03/2021 14:40:24 [name] ([id number]) -------------------------------------------------------------------------------- Clinic Level of Care Assessment Details Patient Name: [name], [name] Date of Service: 02/03/2021 3:00 PM Medical Record Number: [id number] Patient Account Number: [id number] Date of Birth/Sex: [name], [name] (87 y.o. F) Treating RN: [name] Primary Care Yamil Dougher: [name] Other Clinician: Referring [name]: [name] Treating [name] in Treatment: 15 Clinic Level of Care Assessment Items TOOL 1 Quantity Score '[]'  - Use when EandM and Procedure is performed on INITIAL visit 0 ASSESSMENTS - Nursing Assessment / Reassessment '[]'  - General Physical Exam (combine w/ comprehensive assessment (listed just below)  when performed on new 0 pt. evals) '[]'  - 0 Comprehensive Assessment (HX, ROS, Risk Assessments, Wounds Hx, etc.) ASSESSMENTS - Wound and Skin Assessment / Reassessment '[]'  - Dermatologic / Skin Assessment (not related to wound area) 0 ASSESSMENTS - Ostomy and/or Continence Assessment and Care '[]'  - Incontinence Assessment and Management 0 '[]'  - 0 Ostomy Care Assessment and Management (repouching, etc.) PROCESS - Coordination of Care '[]'  - Simple Patient / Family Education for ongoing care 0 '[]'  - 0 Complex (extensive) Patient / Family Education for ongoing care '[]'  - 0 Staff obtains Programmer, systems, Records, Test Results / Process Orders '[]'  - 0 Staff telephones HHA, Nursing Homes / Clarify orders / etc '[]'  - 0 Routine Transfer to another Facility (non-emergent condition) '[]'  - 0 Routine Hospital Admission (non-emergent condition) '[]'  - 0 New Admissions / Biomedical engineer / Ordering NPWT, Apligraf, etc. '[]'  - 0 Emergency Hospital Admission (emergent condition) PROCESS - Special Needs '[]'  - Pediatric / Minor Patient Management 0 '[]'  - 0 Isolation Patient Management '[]'  - 0 Hearing / Language / Visual special needs '[]'  - 0 Assessment of Community assistance (transportation, D/C planning, etc.) '[]'  - 0 Additional assistance / Altered mentation '[]'  - 0 Support Surface(s) Assessment (bed, cushion, seat, etc.) INTERVENTIONS - Miscellaneous '[]'  - External ear exam 0 '[]'  - 0 Patient Transfer (multiple staff / Civil Service fast streamer / Similar devices) '[]'  - 0 Simple Staple / Suture removal (25 or less) '[]'  - 0 Complex Staple / Suture removal (26 or more) '[]'  - 0 Hypo/Hyperglycemic Management (do not check if billed separately) '[]'  - 0 Ankle / Brachial Index (ABI) - do not check if billed separately Has the patient been seen at the hospital within the last three years: Yes Total Score: 0 Level Of Care: ____ [name] ([id number]) Electronic Signature(s) Signed: 02/03/2021 4:28:42 PM By: [name] Entered By: [name] on 02/03/2021 15:06:32 [name] ([id number]) -------------------------------------------------------------------------------- Compression Therapy  Details Patient Name: [name], [name]. Date of Service: 02/03/2021 3:00 PM Medical Record Number: [id number] Patient Account Number: [id number] Date of Birth/Sex: [name] (87 y.o. F) Treating RN: [name] Primary Care [name]: [name] Other Clinician: Referring [name]: [name] Treating [name]/Extender: [name] in Treatment: 15 Compression Therapy Performed for Wound Assessment: Wound #1 Right,Lateral Malleolus Performed By: Elizabeth Argyle, RN Compression Type: Three Layer Post Procedure Diagnosis Same as Pre-procedure Electronic Signature(s) Signed: 02/03/2021 4:28:42 PM By: [name] Entered By: [name] on 02/03/2021 15:05:24 [name] ([id number]) -------------------------------------------------------------------------------- Compression Therapy Details Patient Name: [name], [name] Date of Service: 02/03/2021 3:00 PM Medical Record Number: [id number] Patient Account Number: [id number] Date of Birth/Sex: [name] (87 y.o. F) Treating RN: [name] Primary Care Lexton Hidalgo: [name] Other Clinician: Referring [name]: [name] Treating [name]/Extender: [name] in Treatment: 15 Compression Therapy Performed for Wound Assessment: Wound #2 Left Calcaneus Performed By: Clinician Elizabeth Poag, RN Compression Type: Three Layer Post Procedure Diagnosis Same as Pre-procedure Electronic Signature(s) Signed: 02/03/2021 4:28:42 PM By: [name] Entered By: [name] on 02/03/2021 15:05:36 [name] ([id number]) -------------------------------------------------------------------------------- Encounter Discharge Information Details Patient Name: [name], [name] Date of Service: 02/03/2021 3:00 PM Medical Record Number:  [id number] Patient Account Number: [id number] Date of Birth/Sex: [birth year] (87 y.o. F) Treating RN: [name] Primary Care Markeise Mathews: [name] Other Clinician: Referring Andreea Arca: [name] Treating Draper Gallon/Extender: [name] in Treatment: 15 Encounter Discharge Information Items Discharge Condition: Stable Ambulatory Status: Walker Discharge Destination: Home Transportation: Private Auto Accompanied By: daughter in law Schedule Follow-up Appointment: Yes Clinical Summary of Care: Electronic Signature(s) Signed: 02/03/2021 4:28:42 PM By: [name] Entered By: [name] on 02/03/2021 15:23:43 Daughdrill, [name] ([id number]) -------------------------------------------------------------------------------- Lower Extremity Assessment Details Patient Name: [name], [name] Date of Service: 02/03/2021 3:00 PM Medical Record Number: [id number] Patient Account Number: [id number] Date of Birth/Sex: [name] (87 y.o. F) Treating RN: [name] Primary Care Adelita Hone: [name] Other Clinician: Referring Alica Shellhammer: [name] Treating Rakim Moone/Extender: [name] in Treatment: 15 Edema Assessment Assessed: Shirlyn Goltz: Yes] Patrice Paradise: Yes] Edema: [Left: Yes] [Right: Yes] Calf Left: Right: Point of Measurement: 33 cm From Medial Instep 21.5 cm 20.5 cm Ankle Left: Right: Point of Measurement: 9 cm From Medial Instep 19 cm 19 cm Vascular Assessment Pulses: Dorsalis Pedis Palpable: [Left:Yes] [Right:Yes] Electronic Signature(s) Signed: 02/03/2021 4:28:42 PM By: [name] Entered By: [name] on 02/03/2021 14:56:19 Deridder, [name] ([id number]) -------------------------------------------------------------------------------- Multi Wound Chart Details Patient Name: [name] Date of Service: 02/03/2021 3:00 PM Medical Record Number: [id number] Patient Account Number: [id number] Date of Birth/Sex: [birth year] [name] (87 y.o. F) Treating RN: [name] Primary Care Aleisa Howk: [name] Other Clinician: Referring Shalia Bartko: [name] Treating Lonnel Gjerde/Extender: [name] in Treatment: 15 Vital Signs Height(in): 64 Pulse(bpm): 40 Weight(lbs): 28 Blood Pressure(mmHg): 124/80 Body Mass Index(BMI): 12 Temperature(F): 98.2 Respiratory Rate(breaths/min): Downs Photos: Wound Location: Right, Lateral Malleolus Left Calcaneus Right Ischial Tuberosity Wounding Event: Gradually Appeared Gradually Appeared Blister Primary Etiology: Pressure Ulcer Pressure Ulcer Pressure Ulcer Comorbid History: Arrhythmia, Coronary Artery Arrhythmia, Coronary Artery Arrhythmia, Coronary Artery Disease, Hypertension, Peripheral Disease, Hypertension, Peripheral Disease, Hypertension, Peripheral Arterial Disease, History of pressure Arterial Disease, History of pressure Arterial Disease, History of pressure wounds, Osteoarthritis wounds, Osteoarthritis wounds, Osteoarthritis Date Acquired: 10/22/2014 07/09/2020 01/16/2021 Weeks of Treatment: '15 15 1 ' Wound Status: Open Open Open Measurements L x W x D (cm) 0.9x0.7x0.1 0.6x0.4x0.1 1.5x1.8x0.1 Area (cm) : 0.495 0.188 2.121 Volume (cm) :  0.049 0.019 0.212 % Reduction in Area: 36.90% 87.70% 28.60% % Reduction in Volume: 38.00% 87.60% 28.60% Classification: Category/Stage III Unstageable/Unclassified Category/Stage III Exudate Amount: Medium Medium Medium Exudate Type: Serous Serous Serosanguineous Exudate Color: amber amber red, brown Wound Margin: Flat and Intact N/A N/A Granulation Amount: Medium (34-66%) Medium (34-66%) Medium (34-66%) Granulation Quality: Red, Pink Red, Pink Pink Necrotic Amount: Medium (34-66%) Medium (34-66%) Medium (34-66%) Exposed Structures: Fat Layer (Subcutaneous Tissue): Fat Layer (Subcutaneous Tissue): Fat Layer (Subcutaneous Tissue): Yes Yes Yes Fascia: No Fascia: No Fascia: No Tendon: No Tendon: No Tendon: No Muscle: No Muscle: No Muscle: No Joint:  No Joint: No Joint: No Bone: No Bone: No Bone: No Epithelialization: None None N/A Treatment Notes Electronic Signature(s) Signed: 02/03/2021 4:28:42 PM By: [name] Entered By: [name] on 02/03/2021 14:56:45 Browning, [name] ([id number]) -------------------------------------------------------------------------------- [place]annie Details Patient Name: KENDI, DEFALCO Date of Service: 02/03/2021 3:00 PM Medical Record Number: [id number] Patient Account Number: [id number] Date of Birth/Sex: [name] (87 y.o. F) Treating RN: [name] Primary Care Sullivan Jacuinde: [name] Other Clinician: Referring Nema Oatley: [name] Treating Cecillia Menees/Extender: [name] in Treatment: 15 Active Inactive Wound/Skin Impairment Nursing Diagnoses: Impaired tissue integrity Goals: Patient/caregiver will verbalize understanding of skin care regimen Date Initiated: 10/21/2020 Date Inactivated: 12/23/2020 Target Resolution Date: 12/21/2020 Goal Status: Met Ulcer/skin breakdown will have a volume reduction of 30% by week 4 Date Initiated: 10/21/2020 Date Inactivated: 11/25/2020 Target Resolution Date: 11/20/2020 Goal Status: Met Ulcer/skin breakdown will have a volume reduction of 50% by week 8 Date Initiated: 10/21/2020 Date Inactivated: 12/23/2020 Target Resolution Date: 12/21/2020 Goal Status: Met Ulcer/skin breakdown will have a volume reduction of 80% by week 12 Date Initiated: 10/21/2020 Target Resolution Date: 01/20/2021 Goal Status: Active Ulcer/skin breakdown will heal within 14 weeks Date Initiated: 10/21/2020 Target Resolution Date: 02/20/2021 Goal Status: Active Interventions: Assess patient/caregiver ability to obtain necessary supplies Assess patient/caregiver ability to perform ulcer/skin care regimen upon admission and as needed Assess ulceration(s) every visit Notes: Electronic Signature(s) Signed: 02/03/2021 4:28:42 PM By: [name] Entered  By: [name] on 02/03/2021 14:56:34 Gentzler, [name] ([id number]) -------------------------------------------------------------------------------- Pain Assessment Details Patient Name: [name], [name] Date of Service: 02/03/2021 3:00 PM Medical Record Number: [id number] Patient Account Number: [id number] Date of Birth/Sex: [birth year], [name] (87 y.o. F) Treating RN: [name] Primary Care Terressa Evola: [name] Other Clinician: Referring Kwame Ryland: [name] Treating Sundee Garland/Extender: [name] in Treatment: 15 Active Problems Location of Pain Severity and Description of Pain Patient Has Paino No Site Locations Rate the pain. Current Pain Level: 0 Pain Management and Medication Current Pain Management: Electronic Signature(s) Signed: 02/03/2021 4:28:42 PM By: [name] Entered By: [name] on 02/03/2021 14:41:01 Morello, [name] ([id number]) -------------------------------------------------------------------------------- Patient/Caregiver Education Details Patient Name: CANDISE, CRABTREE Date of Service: 02/03/2021 3:00 PM Medical Record Number: [id number] Patient Account Number: [id number] Date of Birth/Gender: [name][birth year] (87 y.o. F) Treating RN: [name] Primary Care Physician: [name] Other Clinician: Referring Physician: Viviana Downs Treating Physician/Extender: [name] in Treatment: 15 Education Assessment Education Provided To: Patient and Caregiver Education Topics Provided Basic Hygiene: Offloading: Wound/Skin Impairment: Electronic Signature(s) Signed: 02/03/2021 4:28:42 PM By: [name] Entered By: [name] on 02/03/2021 15:07:08 [name] ([id number]) -------------------------------------------------------------------------------- Wound Assessment Details Patient Name: TONNA, PALAZZI Date of Service: 02/03/2021 3:00 PM Medical Record Number: [id number] Patient Account Number: [id number] Date of Birth/Sex:  [birth year] [name] (87 y.o. F) Treating RN: [name] Primary Care Kaysee Hergert: [name] Other Clinician: Referring Raekwan Spelman:  [name] Treating Jones Viviani/Extender: Jeri Cos Weeks in Treatment: 15 Wound Status Wound Number: 1 Primary Pressure Ulcer Etiology: Wound Location: Right, Lateral Malleolus Wound Open Wounding Event: Gradually Appeared Status: Date Acquired: 10/22/2014 Comorbid Arrhythmia, Coronary Artery Disease, Hypertension, Weeks Of Treatment: 15 History: Peripheral Arterial Disease, History of pressure wounds, Clustered Wound: No Osteoarthritis Photos Wound Measurements Length: (cm) 0.9 Width: (cm) 0.7 Depth: (cm) 0.1 Area: (cm) 0.495 Volume: (cm) 0.049 % Reduction in Area: 36.9% % Reduction in Volume: 38% Epithelialization: None Tunneling: No Undermining: No Wound Description Classification: Category/Stage III Wound Margin: Flat and Intact Exudate Amount: Medium Exudate Type: Serous Exudate Color: amber Foul Odor After Cleansing: No Slough/Fibrino Yes Wound Bed Granulation Amount: Medium (34-66%) Exposed Structure Granulation Quality: Red, Pink Fascia Exposed: No Necrotic Amount: Medium (34-66%) Fat Layer (Subcutaneous Tissue) Exposed: Yes Necrotic Quality: Adherent Slough Tendon Exposed: No Muscle Exposed: No Joint Exposed: No Bone Exposed: No Treatment Notes Wound #1 (Malleolus) Wound Laterality: Right, Lateral Cleanser Soap and Water Discharge Instruction: Gently cleanse wound with antibacterial soap, rinse and pat dry prior to dressing wounds SUSEN, HASKEW ([id number]) Peri-Wound Care Topical Primary Dressing Prisma 4.34 (in) Discharge Instruction: Moisten w/normal saline or sterile water; Cover wound as directed. Do not remove from wound bed. Secondary Dressing ABD Pad 5x9 (in/in) Discharge Instruction: Cover with ABD pad-also pad upper shin with extra ABD pad Secured With Compression Wrap Profore Lite LF 3 Multilayer  Compression Bandaging System Discharge Instruction: Apply 3 multi-layer wrap as prescribed. Compression Stockings Add-Ons Electronic Signature(s) Signed: 02/03/2021 4:28:42 PM By: [name] Entered By: [name] on 02/03/2021 14:53:21 Montminy, [name] ([id number]) -------------------------------------------------------------------------------- Wound Assessment Details Patient Name: TABITHIA, STRODER Date of Service: 02/03/2021 3:00 PM Medical Record Number: [id number] Patient Account Number: [id number] Date of Birth/Sex: [birth year] (87 y.o. F) Treating RN: [name] Primary Care Blaise Grieshaber: [name] Other Clinician: Referring Shedric Fredericks: [name] Treating Jeremie Abdelaziz/Extender: [name] in Treatment: 15 Wound Status Wound Number: 2 Primary Pressure Ulcer Etiology: Wound Location: Left Calcaneus Wound Open Wounding Event: Gradually Appeared Status: Date Acquired: 07/09/2020 Comorbid Arrhythmia, Coronary Artery Disease, Hypertension, Weeks Of Treatment: 15 History: Peripheral Arterial Disease, History of pressure wounds, Clustered Wound: No Osteoarthritis Photos Wound Measurements Length: (cm) 0.6 Width: (cm) 0.4 Depth: (cm) 0.1 Area: (cm) 0.188 Volume: (cm) 0.019 % Reduction in Area: 87.7% % Reduction in Volume: 87.6% Epithelialization: None Tunneling: No Undermining: No Wound Description Classification: Unstageable/Unclassified Exudate Amount: Medium Exudate Type: Serous Exudate Color: amber Foul Odor After Cleansing: No Slough/Fibrino Yes Wound Bed Granulation Amount: Medium (34-66%) Exposed Structure Granulation Quality: Red, Pink Fascia Exposed: No Necrotic Amount: Medium (34-66%) Fat Layer (Subcutaneous Tissue) Exposed: Yes Necrotic Quality: Adherent Slough Tendon Exposed: No Muscle Exposed: No Joint Exposed: No Bone Exposed: No Treatment Notes Wound #2 (Calcaneus) Wound Laterality: Left Cleanser Soap and Water Discharge  Instruction: Gently cleanse wound with antibacterial soap, rinse and pat dry prior to dressing wounds Peri-Wound Care DAISIA, SLOMSKI ([id number]) Topical Primary Dressing Prisma 4.34 (in) Discharge Instruction: Moisten w/normal saline or sterile water; Cover wound as directed. Do not remove from wound bed. Secondary Dressing ABD Pad 5x9 (in/in) Discharge Instruction: Cover with ABD pad Secured With Compression Wrap Profore Lite LF 3 Multilayer Compression Bandaging System Discharge Instruction: Apply 3 multi-layer wrap as prescribed. Compression Stockings Add-Ons Electronic Signature(s) Signed: 02/03/2021 4:28:42 PM By: [name] Entered By: [name] on 02/03/2021 14:53:52 Piazza, [name] ([id number]) -------------------------------------------------------------------------------- Wound Assessment Details Patient Name: GRAZIELLA, CONNERY Date of Service: 02/03/2021 3:00  PM Medical Record Number: [id number] Patient Account Number: [id number] Date of Birth/Sex: [name] (87 y.o. F) Treating RN: [name] Primary Care Shabrea Weldin: [name] Other Clinician: Referring Kirsi Hugh: [name] Treating Rayjon Wery/Extender: [name] in Treatment: 15 Wound Status Wound Number: 4 Primary Pressure Ulcer Etiology: Wound Location: Right Ischial Tuberosity Wound Open Wounding Event: Blister Status: Date Acquired: 01/16/2021 Comorbid Arrhythmia, Coronary Artery Disease, Hypertension, Weeks Of Treatment: 1 History: Peripheral Arterial Disease, History of pressure wounds, Clustered Wound: No Osteoarthritis Photos Wound Measurements Length: (cm) 1.5 Width: (cm) 1.8 Depth: (cm) 0.1 Area: (cm) 2.121 Volume: (cm) 0.212 % Reduction in Area: 28.6% % Reduction in Volume: 28.6% Tunneling: No Undermining: No Wound Description Classification: Category/Stage III Exudate Amount: Medium Exudate Type: Serosanguineous Exudate Color: red, brown Foul Odor After Cleansing:  No Slough/Fibrino Yes Wound Bed Granulation Amount: Medium (34-66%) Exposed Structure Granulation Quality: Pink Fascia Exposed: No Necrotic Amount: Medium (34-66%) Fat Layer (Subcutaneous Tissue) Exposed: Yes Necrotic Quality: Adherent Slough Tendon Exposed: No Muscle Exposed: No Joint Exposed: No Bone Exposed: No Treatment Notes Wound #4 (Ischial Tuberosity) Wound Laterality: Right Cleanser Soap and Water Discharge Instruction: Gently cleanse wound with antibacterial soap, rinse and pat dry prior to dressing wounds Peri-Wound Care KYRAH, SCHIRO ([id number]) Topical Primary Dressing Prisma 4.34 (in) Discharge Instruction: Moisten w/normal saline or sterile water; Cover wound as directed. Do not remove from wound bed. Secondary Dressing Mepilex Border Flex, 4x4 (in/in) Discharge Instruction: Apply to wound as directed. Do not cut. Secured With Compression Wrap Compression Stockings Add-Ons Electronic Signature(s) Signed: 02/03/2021 4:28:42 PM By: [name] Entered By: [name] on 02/03/2021 14:54:25 Finder, [name] ([id number]) -------------------------------------------------------------------------------- [place] Details Patient Name: [name] Date of Service: 02/03/2021 3:00 PM Medical Record Number: [id number] Patient Account Number: [id number] Date of Birth/Sex: [birth year] [name] (87 y.o. F) Treating RN: [name] Primary Care Kalesha Irving: [name] Other Clinician: Referring Shaneka Efaw: [name] Treating Danelia Snodgrass/Extender: [name] in Treatment: 15 Vital Signs Time Taken: 14:40 Temperature (F): 98.2 Height (in): 66 Pulse (bpm): 71 Weight (lbs): 76 Respiratory Rate (breaths/min): Downs Body Mass Index (BMI): 12.3 Blood Pressure (mmHg): 124/80 Reference Range: 80 - 120 mg / dl Electronic Signature(s) Signed: 02/03/2021 4:28:42 PM By: [name] Entered ByDonnamarie Downs on 02/03/2021 14:40:51

## 2021-02-06 ENCOUNTER — Other Ambulatory Visit: Payer: Self-pay | Admitting: Internal Medicine

## 2021-02-06 NOTE — Telephone Encounter (Signed)
Last filled 11-30-20 #60 Last OV 01-16-21 Next OV 06-05-21 [place]

## 2021-02-06 NOTE — Progress Notes (Signed)
[name], [name] ([id number]) Visit Report for 02/03/2021 Chief Complaint Document Details Patient Name: [name], [name]. Date of Service: 02/03/2021 3:00 PM Medical Record Number: [id number] Patient Account Number: [id number] Date of Birth/Sex: 1927/07/19 (87 y.o. F) Treating RN: Donnamarie Poag Primary Care Provider: Viviana Simpler Other Clinician: Referring Provider: Viviana Simpler Treating Provider/Extender: Skipper Cliche in Treatment: 15 Information Obtained from: Patient Chief Complaint Pressure ulcer right ankle and left heel and right ischial/buttock pressure ulcer Electronic Signature(s) Signed: 02/03/2021 2:43:28 PM By: Worthy Keeler PA-C Entered By: Worthy Keeler on 02/03/2021 14:43:27 Sposito, [name] ([id number]) -------------------------------------------------------------------------------- HPI Details Patient Name: [name], [name] Date of Service: 02/03/2021 3:00 PM Medical Record Number: [id number] Patient Account Number: [id number] Date of Birth/Sex: Dec 28, 1927 (87 y.o. F) Treating RN: Donnamarie Poag Primary Care Provider: Viviana Simpler Other Clinician: Referring Provider: Viviana Simpler Treating Provider/Extender: Skipper Cliche in Treatment: 15 History of Present Illness HPI Description: 10/21/2020 upon evaluation today patient presents for initial inspection here in our clinic concerning issues that she has been having quite some time in regard to her ankle and since she has been in the hospital with regard to the heel. The ankle in fact has been 5-6 years at least I am told. With that being said the heel ulcer occurred when she was in the hospital in December for hip surgery when she fractured her hip. She also was in the hospital Tuesday for altered mental status. Really there was nothing that was identified as the cause for this. She did have a. Fortunately there does not appear to be any signs of infection she tells me that she has had she believes arterial  studies At [facility] clinic I could not find Those studies at this point. Nonetheless I will continue to look and see what I can find before Then. The patient also has a skin tear on her arm this occurred more recently when she bumped this at home. The patient does have a history of hypertension, coronary artery disease, and peripheral vascular disease stated. 10/28/2020 I was able to find the patient's chart currently which shows that she did have an arterial study performed in May 2019. This showed that she had a abnormal right toe brachial index and a normal left toe brachial index. She was noncompressible as far as ABIs were concerned. She did appear to have triphasic flow at that time. Unfortunately the wound that is commented on in the report that I printed off and read mentions the same wound on the ankle that we are still dealing with at this point. Unfortunately this has not healed. And its been quite sometime about 3 years now. Fortunately there does not appear to be any signs of active infection systemically at this point. I think that the patient has done well with the Santyl over the past week which is good news. Patient's caregiver which is her daughter-in-law is concerned about the fact that she really does not feel qualified to be able to change the dressings and take care of this issue. There does not appear to be any signs of anything untoward going on at this point. I think she is done a great job applying the [employer] and I think that has done a great job for the patient is for soften up some of the necrotic tissue. With that being said I think the Xeroform on the arm also has done excellent. In general I am very pleased with where we stand. And I told the patient's  daughter-in- law as well that I also feel like she has done a great job over the past week taking care of her mother. 11/11/2020 upon evaluation today patient appears to be doing well with regard to her wounds. She has been  tolerating the dressing changes without complication her daughters been applying the Santyl which has done a great job. Ho with that being said I think now that we have good arterial study showing we can go ahead and proceed with sharp debridement at this point. 11/25/2020 upon evaluation today patient appears to be doing well with regard to her wounds. The Santyl has really helped to clean things up and overall she is doing quite excellent at this point. Fortunately there does not appear to be any signs of infection which is great news and in general extremely pleased. I do believe some debridement is in order and hopefully will be able to get her into a collagen dressing after this. 12/23/2020 upon evaluation today patient appears to be doing well with regard to her wound all things considered. Unfortunately it does sound like her daughter decided to let this air out because she felt like it was getting so wet. It sounds like she got border gauze dressings instead of border foam therefore there is really Nothing to catch the excess drainage which I think is the problem they were worried about the smell. Fortunately there does not appear to be any signs of active infection which is great news. Nonetheless I do not think we want to leave this just open to air. 01/13/2021 upon evaluation today patient's wounds actually appear to be about as good as have seen since have been taking care of her. Fortunately there does not appear to be any evidence of infection which is great and overall the biggest issue I see is that of fluid buildup. I think we need to do something to try to help manage this. I think if we can control her edema we get the wounds to heal more effectively. 01/27/2021 upon evaluation today patient appears to be doing well in regard to the wounds on her right lateral malleolus as well as the left heel and the right ischial tuberosity is unfortunately a new area that has arisen since we last saw  her. She tells me currently that that is from sitting too much. With that being said this actually appears to be doing the worst of anything so far that I see today. Fortunately there does not appear to be any signs of infection this is at least good news. Compression wrap seem to be doing excellent for her as far as the legs are concerned. 02/03/2021 upon evaluation today patient appears to be doing well with regard to all of her wounds. She has been tolerating the dressing changes without complication. Fortunately there is no signs of active infection at this time. No fevers, chills, nausea, vomiting, or diarrhea. Electronic Signature(s) Signed: 02/03/2021 3:07:32 PM By: Worthy Keeler PA-C Entered By: Worthy Keeler on 02/03/2021 15:07:31 Napierkowski, [name] ([id number]) -------------------------------------------------------------------------------- Physical Exam Details Patient Name: [name], [name] Date of Service: 02/03/2021 3:00 PM Medical Record Number: [id number] Patient Account Number: [id number] Date of Birth/Sex: [birth year] (87 y.o. F) Treating RN: Donnamarie Poag Primary Care Provider: Viviana Simpler Other Clinician: Referring Provider: Viviana Simpler Treating Provider/Extender: Skipper Cliche in Treatment: 74 Constitutional Well-nourished and well-hydrated in no acute distress. Respiratory normal breathing without difficulty. Psychiatric this patient is able to make decisions and demonstrates good insight  into disease process. Alert and Oriented x 3. pleasant and cooperative. Notes Upon inspection patient's wound bed showed signs of good granulation epithelization at this point. There does not appear to be any signs of infection all the wounds are showing evidence of improvement which is great overall I am extremely pleased in general with where things stand. Electronic Signature(s) Signed: 02/03/2021 3:07:58 PM By: Worthy Keeler PA-C Entered By: Worthy Keeler on  02/03/2021 15:07:58 Chianese, [name] ([id number]) -------------------------------------------------------------------------------- Physician Orders Details Patient Name: KATHALEYA, WARRICK Date of Service: 02/03/2021 3:00 PM Medical Record Number: [id number] Patient Account Number: [id number] Date of Birth/Sex: 1928-04-24 (87 y.o. F) Treating RN: Donnamarie Poag Primary Care Provider: Viviana Simpler Other Clinician: Referring Provider: Viviana Simpler Treating Provider/Extender: Skipper Cliche in Treatment: 15 Verbal / Phone Orders: No Diagnosis Coding ICD-10 Coding Code Description L89.513 Pressure ulcer of right ankle, stage 3 L89.623 Pressure ulcer of left heel, stage 3 S51.802A Unspecified open wound of left forearm, initial encounter L89.312 Pressure ulcer of right buttock, stage 2 I10 Essential (primary) hypertension I25.10 Atherosclerotic heart disease of native coronary artery without angina pectoris I73.89 Other specified peripheral vascular diseases Follow-up Appointments o Return Appointment in 1 week. - May do nurse visit to allow weekly visits if needed o Nurse Visit as needed Bathing/ Shower/ Hygiene o May shower with wound dressing protected with water repellent cover or cast protector. o No tub bath. Edema Control - Lymphedema / Segmental Compressive Device / Other Bilateral Lower Extremities o Optional: One layer of unna paste to top of compression wrap (to act as an anchor). o Elevate legs to the level of the heart and pump ankles as often as possible o Elevate leg(s) parallel to the floor when sitting. o DO YOUR BEST to sleep in the bed at night. DO NOT sleep in your recliner. Long hours of sitting in a recliner leads to swelling of the legs and/or potential wounds on your backside. Off-Loading o Gel wheelchair cushion - prop when sitting or laying to avoid pressure on your hip wound o Turn and reposition every 2 hours Wound Treatment Wound #1  - Malleolus Wound Laterality: Right, Lateral Cleanser: Soap and Water 1 x Per Day/30 Days Discharge Instructions: Gently cleanse wound with antibacterial soap, rinse and pat dry prior to dressing wounds Primary Dressing: Prisma 4.34 (in) (Generic) 1 x Per Day/30 Days Discharge Instructions: Moisten w/normal saline or sterile water; Cover wound as directed. Do not remove from wound bed. Secondary Dressing: ABD Pad 5x9 (in/in) 1 x Per Day/30 Days Discharge Instructions: Cover with ABD pad-also pad upper shin with extra ABD pad Compression Wrap: Profore Lite LF 3 Multilayer Compression Bandaging System 1 x Per Day/30 Days Discharge Instructions: Apply 3 multi-layer wrap as prescribed. Wound #2 - Calcaneus Wound Laterality: Left Cleanser: Soap and Water 1 x Per Day/30 Days Discharge Instructions: Gently cleanse wound with antibacterial soap, rinse and pat dry prior to dressing wounds Primary Dressing: Prisma 4.34 (in) (Generic) 1 x Per Day/30 Days Discharge Instructions: Moisten w/normal saline or sterile water; Cover wound as directed. Do not remove from wound bed. Secondary Dressing: ABD Pad 5x9 (in/in) 1 x Per Day/30 Days Discharge Instructions: Cover with ABD pad LATISHA, WELTON ([id number]) Compression Wrap: Profore Lite LF 3 Multilayer Compression Bandaging System 1 x Per Day/30 Days Discharge Instructions: Apply 3 multi-layer wrap as prescribed. Wound #4 - Ischial Tuberosity Wound Laterality: Right Cleanser: Soap and Water 3 x Per Week/30 Days Discharge Instructions: Gently cleanse wound  with antibacterial soap, rinse and pat dry prior to dressing wounds Primary Dressing: Prisma 4.34 (in) (Generic) 3 x Per Week/30 Days Discharge Instructions: Moisten w/normal saline or sterile water; Cover wound as directed. Do not remove from wound bed. Secondary Dressing: Mepilex Border Flex, 4x4 (in/in) (Generic) 3 x Per Week/30 Days Discharge Instructions: Apply to wound as directed. Do not  cut. Electronic Signature(s) Signed: 02/03/2021 4:21:57 PM By: Worthy Keeler PA-C Signed: 02/03/2021 4:28:42 PM By: Donnamarie Poag Entered By: Donnamarie Poag on 02/03/2021 15:06:26 [name] ([id number]) -------------------------------------------------------------------------------- Problem List Details Patient Name: [name], [name] Date of Service: 02/03/2021 3:00 PM Medical Record Number: [id number] Patient Account Number: [id number] Date of Birth/Sex: 1928-06-08 (87 y.o. F) Treating RN: Donnamarie Poag Primary Care Provider: Viviana Simpler Other Clinician: Referring Provider: Viviana Simpler Treating Provider/Extender: Skipper Cliche in Treatment: 15 Active Problems ICD-10 Encounter Code Description Active Date MDM Diagnosis L89.513 Pressure ulcer of right ankle, stage 3 10/21/2020 No Yes L89.623 Pressure ulcer of left heel, stage 3 10/21/2020 No Yes S51.802A Unspecified open wound of left forearm, initial encounter 10/21/2020 No Yes L89.312 Pressure ulcer of right buttock, stage 2 01/27/2021 No Yes I10 Essential (primary) hypertension 10/21/2020 No Yes I25.10 Atherosclerotic heart disease of native coronary artery without angina 10/21/2020 No Yes pectoris I73.89 Other specified peripheral vascular diseases 10/21/2020 No Yes Inactive Problems Resolved Problems Electronic Signature(s) Signed: 02/03/2021 2:43:22 PM By: Worthy Keeler PA-C Entered By: Worthy Keeler on 02/03/2021 14:43:22 Demorest, [name] ([id number]) -------------------------------------------------------------------------------- Progress Note Details Patient Name: [name] Date of Service: 02/03/2021 3:00 PM Medical Record Number: [id number] Patient Account Number: [id number] Date of Birth/Sex: [birth year] (87 y.o. F) Treating RN: Donnamarie Poag Primary Care Provider: Viviana Simpler Other Clinician: Referring Provider: Viviana Simpler Treating Provider/Extender: Skipper Cliche in Treatment:  15 Subjective Chief Complaint Information obtained from Patient Pressure ulcer right ankle and left heel and right ischial/buttock pressure ulcer History of Present Illness (HPI) 10/21/2020 upon evaluation today patient presents for initial inspection here in our clinic concerning issues that she has been having quite some time in regard to her ankle and since she has been in the hospital with regard to the heel. The ankle in fact has been 5-6 years at least I am told. With that being said the heel ulcer occurred when she was in the hospital in December for hip surgery when she fractured her hip. She also was in the hospital Tuesday for altered mental status. Really there was nothing that was identified as the cause for this. She did have a. Fortunately there does not appear to be any signs of infection she tells me that she has had she believes arterial studies At [facility] clinic I could not find Those studies at this point. Nonetheless I will continue to look and see what I can find before Then. The patient also has a skin tear on her arm this occurred more recently when she bumped this at home. The patient does have a history of hypertension, coronary artery disease, and peripheral vascular disease stated. 10/28/2020 I was able to find the patient's chart currently which shows that she did have an arterial study performed in May 2019. This showed that she had a abnormal right toe brachial index and a normal left toe brachial index. She was noncompressible as far as ABIs were concerned. She did appear to have triphasic flow at that time. Unfortunately the wound that is commented on in the report that I printed off and  read mentions the same wound on the ankle that we are still dealing with at this point. Unfortunately this has not healed. And its been quite sometime about 3 years now. Fortunately there does not appear to be any signs of active infection systemically at this point. I think that the  patient has done well with the Santyl over the past week which is good news. Patient's caregiver which is her daughter-in-law is concerned about the fact that she really does not feel qualified to be able to change the dressings and take care of this issue. There does not appear to be any signs of anything untoward going on at this point. I think she is done a great job applying the [employer] and I think that has done a great job for the patient is for soften up some of the necrotic tissue. With that being said I think the Xeroform on the arm also has done excellent. In general I am very pleased with where we stand. And I told the patient's daughter-in- law as well that I also feel like she has done a great job over the past week taking care of her mother. 11/11/2020 upon evaluation today patient appears to be doing well with regard to her wounds. She has been tolerating the dressing changes without complication her daughters been applying the Santyl which has done a great job. Ho with that being said I think now that we have good arterial study showing we can go ahead and proceed with sharp debridement at this point. 11/25/2020 upon evaluation today patient appears to be doing well with regard to her wounds. The Santyl has really helped to clean things up and overall she is doing quite excellent at this point. Fortunately there does not appear to be any signs of infection which is great news and in general extremely pleased. I do believe some debridement is in order and hopefully will be able to get her into a collagen dressing after this. 12/23/2020 upon evaluation today patient appears to be doing well with regard to her wound all things considered. Unfortunately it does sound like her daughter decided to let this air out because she felt like it was getting so wet. It sounds like she got border gauze dressings instead of border foam therefore there is really Nothing to catch the excess drainage which I  think is the problem they were worried about the smell. Fortunately there does not appear to be any signs of active infection which is great news. Nonetheless I do not think we want to leave this just open to air. 01/13/2021 upon evaluation today patient's wounds actually appear to be about as good as have seen since have been taking care of her. Fortunately there does not appear to be any evidence of infection which is great and overall the biggest issue I see is that of fluid buildup. I think we need to do something to try to help manage this. I think if we can control her edema we get the wounds to heal more effectively. 01/27/2021 upon evaluation today patient appears to be doing well in regard to the wounds on her right lateral malleolus as well as the left heel and the right ischial tuberosity is unfortunately a new area that has arisen since we last saw her. She tells me currently that that is from sitting too much. With that being said this actually appears to be doing the worst of anything so far that I see today. Fortunately there does  not appear to be any signs of infection this is at least good news. Compression wrap seem to be doing excellent for her as far as the legs are concerned. 02/03/2021 upon evaluation today patient appears to be doing well with regard to all of her wounds. She has been tolerating the dressing changes without complication. Fortunately there is no signs of active infection at this time. No fevers, chills, nausea, vomiting, or diarrhea. Objective [name], [name] ([id number]) Constitutional Well-nourished and well-hydrated in no acute distress. Vitals Time Taken: 2:40 PM, Height: 66 in, Weight: 76 lbs, BMI: 12.3, Temperature: 98.2 F, Pulse: 71 bpm, Respiratory Rate: 16 breaths/min, Blood Pressure: 124/80 mmHg. Respiratory normal breathing without difficulty. Psychiatric this patient is able to make decisions and demonstrates good insight into disease process.  Alert and Oriented x 3. pleasant and cooperative. General Notes: Upon inspection patient's wound bed showed signs of good granulation epithelization at this point. There does not appear to be any signs of infection all the wounds are showing evidence of improvement which is great overall I am extremely pleased in general with where things stand. Integumentary (Hair, Skin) Wound #1 status is Open. Original cause of wound was Gradually Appeared. The date acquired was: 10/22/2014. The wound has been in treatment 15 weeks. The wound is located on the Right,Lateral Malleolus. The wound measures 0.9cm length x 0.7cm width x 0.1cm depth; 0.495cm^2 area and 0.049cm^3 volume. There is Fat Layer (Subcutaneous Tissue) exposed. There is no tunneling or undermining noted. There is a medium amount of serous drainage noted. The wound margin is flat and intact. There is medium (34-66%) red, pink granulation within the wound bed. There is a medium (34-66%) amount of necrotic tissue within the wound bed including Adherent Slough. Wound #2 status is Open. Original cause of wound was Gradually Appeared. The date acquired was: 07/09/2020. The wound has been in treatment 15 weeks. The wound is located on the Left Calcaneus. The wound measures 0.6cm length x 0.4cm width x 0.1cm depth; 0.188cm^2 area and 0.019cm^3 volume. There is Fat Layer (Subcutaneous Tissue) exposed. There is no tunneling or undermining noted. There is a medium amount of serous drainage noted. There is medium (34-66%) red, pink granulation within the wound bed. There is a medium (34-66%) amount of necrotic tissue within the wound bed including Adherent Slough. Wound #4 status is Open. Original cause of wound was Blister. The date acquired was: 01/16/2021. The wound has been in treatment 1 weeks. The wound is located on the Right Ischial Tuberosity. The wound measures 1.5cm length x 1.8cm width x 0.1cm depth; 2.121cm^2 area and 0.212cm^3 volume. There is  Fat Layer (Subcutaneous Tissue) exposed. There is no tunneling or undermining noted. There is a medium amount of serosanguineous drainage noted. There is medium (34-66%) pink granulation within the wound bed. There is a medium (34-66%) amount of necrotic tissue within the wound bed including Adherent Slough. Assessment Active Problems ICD-10 Pressure ulcer of right ankle, stage 3 Pressure ulcer of left heel, stage 3 Unspecified open wound of left forearm, initial encounter Pressure ulcer of right buttock, stage 2 Essential (primary) hypertension Atherosclerotic heart disease of native coronary artery without angina pectoris Other specified peripheral vascular diseases Procedures Wound #1 Pre-procedure diagnosis of Wound #1 is a Pressure Ulcer located on the Right,Lateral Malleolus . There was a Three Layer Compression Therapy Procedure by Donnamarie Poag, RN. Post procedure Diagnosis Wound #1: Same as Pre-Procedure Wound #2 Pre-procedure diagnosis of Wound #2 is a Pressure Ulcer located on  the Left Calcaneus . There was a Three Layer Compression Therapy Procedure by Donnamarie Poag, RN. Post procedure Diagnosis Wound #2: Same as Pre-Procedure [name], [name] ([id number]) Plan Follow-up Appointments: Return Appointment in 1 week. - May do nurse visit to allow weekly visits if needed Nurse Visit as needed Bathing/ Shower/ Hygiene: May shower with wound dressing protected with water repellent cover or cast protector. No tub bath. Edema Control - Lymphedema / Segmental Compressive Device / Other: Optional: One layer of unna paste to top of compression wrap (to act as an anchor). Elevate legs to the level of the heart and pump ankles as often as possible Elevate leg(s) parallel to the floor when sitting. DO YOUR BEST to sleep in the bed at night. DO NOT sleep in your recliner. Long hours of sitting in a recliner leads to swelling of the legs and/or potential wounds on your  backside. Off-Loading: Gel wheelchair cushion - prop when sitting or laying to avoid pressure on your hip wound Turn and reposition every 2 hours WOUND #1: - Malleolus Wound Laterality: Right, Lateral Cleanser: Soap and Water 1 x Per Day/30 Days Discharge Instructions: Gently cleanse wound with antibacterial soap, rinse and pat dry prior to dressing wounds Primary Dressing: Prisma 4.34 (in) (Generic) 1 x Per Day/30 Days Discharge Instructions: Moisten w/normal saline or sterile water; Cover wound as directed. Do not remove from wound bed. Secondary Dressing: ABD Pad 5x9 (in/in) 1 x Per Day/30 Days Discharge Instructions: Cover with ABD pad-also pad upper shin with extra ABD pad Compression Wrap: Profore Lite LF 3 Multilayer Compression Bandaging System 1 x Per Day/30 Days Discharge Instructions: Apply 3 multi-layer wrap as prescribed. WOUND #2: - Calcaneus Wound Laterality: Left Cleanser: Soap and Water 1 x Per Day/30 Days Discharge Instructions: Gently cleanse wound with antibacterial soap, rinse and pat dry prior to dressing wounds Primary Dressing: Prisma 4.34 (in) (Generic) 1 x Per Day/30 Days Discharge Instructions: Moisten w/normal saline or sterile water; Cover wound as directed. Do not remove from wound bed. Secondary Dressing: ABD Pad 5x9 (in/in) 1 x Per Day/30 Days Discharge Instructions: Cover with ABD pad Compression Wrap: Profore Lite LF 3 Multilayer Compression Bandaging System 1 x Per Day/30 Days Discharge Instructions: Apply 3 multi-layer wrap as prescribed. WOUND #4: - Ischial Tuberosity Wound Laterality: Right Cleanser: Soap and Water 3 x Per Week/30 Days Discharge Instructions: Gently cleanse wound with antibacterial soap, rinse and pat dry prior to dressing wounds Primary Dressing: Prisma 4.34 (in) (Generic) 3 x Per Week/30 Days Discharge Instructions: Moisten w/normal saline or sterile water; Cover wound as directed. Do not remove from wound bed. Secondary Dressing:  Mepilex Border Flex, 4x4 (in/in) (Generic) 3 x Per Week/30 Days Discharge Instructions: Apply to wound as directed. Do not cut. 1. Would recommend that we continue with the silver collagen dressing as I feel that that still the best way to go and the patient is in agreement with the plan. 2. I am also can recommend at this time that we have the patient continue with the 3 layer compression wrap to the lower extremities. 3. I am also can recommend that for the hip area the collagen should still be used here as well as that seems to be doing well with a border foam dressing. We will see patient back for reevaluation in 1 week here in the clinic. If anything worsens or changes patient will contact our office for additional recommendations. Electronic Signature(s) Signed: 02/03/2021 3:08:59 PM By: Worthy Keeler PA-C  Entered By: Worthy Keeler on 02/03/2021 15:08:55 Lavey, [name] ([id number]) -------------------------------------------------------------------------------- SuperBill Details Patient Name: [name] Date of Service: 02/03/2021 Medical Record Number: [id number] Patient Account Number: [id number] Date of Birth/Sex: 03-20-1928 (87 y.o. F) Treating RN: Donnamarie Poag Primary Care Provider: Viviana Simpler Other Clinician: Referring Provider: Viviana Simpler Treating Provider/Extender: Skipper Cliche in Treatment: 15 Diagnosis Coding ICD-10 Codes Code Description [phone] Pressure ulcer of right ankle, stage 3 L89.623 Pressure ulcer of left heel, stage 3 S51.802A Unspecified open wound of left forearm, initial encounter L89.312 Pressure ulcer of right buttock, stage 2 I10 Essential (primary) hypertension I25.10 Atherosclerotic heart disease of native coronary artery without angina pectoris I73.89 Other specified peripheral vascular diseases Facility Procedures CPT4: Description Modifier Quantity Code [id number] BILATERAL: Application of multi-layer venous  compression system; leg (below knee), including 1 ankle and foot. Physician Procedures CPT4 Code: [id number] Description: [id number] - WC PHYS LEVEL 4 - EST PT Modifier: Quantity: 1 CPT4 Code: Description: ICD-10 Diagnosis Description L89.513 Pressure ulcer of right ankle, stage 3 L89.623 Pressure ulcer of left heel, stage 3 S51.802A Unspecified open wound of left forearm, initial encounter L89.312 Pressure ulcer of right buttock, stage 2 Modifier: Quantity: Electronic Signature(s) Signed: 02/03/2021 3:09:22 PM By: Worthy Keeler PA-C Entered By: Worthy Keeler on 02/03/2021 15:09:22

## 2021-02-07 ENCOUNTER — Other Ambulatory Visit: Payer: Self-pay | Admitting: Internal Medicine

## 2021-02-08 NOTE — Telephone Encounter (Signed)
Name of Medication: Morphine Sulfate Name of [facility]acy: [facility] or Written Date and Quantity: 12-11-20 #90 Last Office Visit and Type: 01-16-21-22 Next Office Visit and Type: 06-05-21 Last Controlled Substance Agreement Date: 09-29-20 Last UDS: 04-27-19

## 2021-02-10 ENCOUNTER — Other Ambulatory Visit: Payer: Self-pay

## 2021-02-10 ENCOUNTER — Encounter: Payer: PPO | Attending: Physician Assistant | Admitting: Physician Assistant

## 2021-02-10 DIAGNOSIS — L89623 Pressure ulcer of left heel, stage 3: Secondary | ICD-10-CM | POA: Diagnosis not present

## 2021-02-10 DIAGNOSIS — L89513 Pressure ulcer of right ankle, stage 3: Secondary | ICD-10-CM | POA: Diagnosis not present

## 2021-02-10 DIAGNOSIS — I739 Peripheral vascular disease, unspecified: Secondary | ICD-10-CM | POA: Diagnosis not present

## 2021-02-10 DIAGNOSIS — L89312 Pressure ulcer of right buttock, stage 2: Secondary | ICD-10-CM | POA: Diagnosis not present

## 2021-02-10 DIAGNOSIS — I2510 Atherosclerotic heart disease of native coronary artery without angina pectoris: Secondary | ICD-10-CM | POA: Insufficient documentation

## 2021-02-10 NOTE — Progress Notes (Addendum)
[name] ([id number]) Visit Report for 02/10/2021 Chief Complaint Document Details Patient Name: [name], [name]. Date of Service: 02/10/2021 3:00 PM Medical Record Number: [id number] Patient Account Number: [id number] Date of Birth/Sex: March 20, 1928 (87 y.o. F) Treating RN: Donnamarie Poag Primary Care Provider: Viviana Simpler Other Clinician: Referring Provider: Viviana Simpler Treating Provider/Extender: Skipper Cliche in Treatment: 16 Information Obtained from: Patient Chief Complaint Pressure ulcer right ankle and left heel and right ischial/buttock pressure ulcer Electronic Signature(s) Signed: 02/10/2021 3:07:20 PM By: Worthy Keeler PA-C Entered By: Worthy Keeler on 02/10/2021 15:07:20 [name], [name] ([id number]) -------------------------------------------------------------------------------- HPI Details Patient Name: [name], [name] Date of Service: 02/10/2021 3:00 PM Medical Record Number: [id number] Patient Account Number: [id number] Date of Birth/Sex: Nov 23, 1927 (87 y.o. F) Treating RN: Donnamarie Poag Primary Care Provider: Viviana Simpler Other Clinician: Referring Provider: Viviana Simpler Treating Provider/Extender: Skipper Cliche in Treatment: 16 History of Present Illness HPI Description: 10/21/2020 upon evaluation today patient presents for initial inspection here in our clinic concerning issues that she has been having quite some time in regard to her ankle and since she has been in the hospital with regard to the heel. The ankle in fact has been 5-6 years at least I am told. With that being said the heel ulcer occurred when she was in the hospital in December for hip surgery when she fractured her hip. She also was in the hospital Tuesday for altered mental status. Really there was nothing that was identified as the cause for this. She did have a. Fortunately there does not appear to be any signs of infection she tells me that she has had she believes arterial  studies At [facility] clinic I could not find Those studies at this point. Nonetheless I will continue to look and see what I can find before Then. The patient also has a skin tear on her arm this occurred more recently when she bumped this at home. The patient does have a history of hypertension, coronary artery disease, and peripheral vascular disease stated. 10/28/2020 I was able to find the patient's chart currently which shows that she did have an arterial study performed in May 2019. This showed that she had a abnormal right toe brachial index and a normal left toe brachial index. She was noncompressible as far as ABIs were concerned. She did appear to have triphasic flow at that time. Unfortunately the wound that is commented on in the report that I printed off and read mentions the same wound on the ankle that we are still dealing with at this point. Unfortunately this has not healed. And its been quite sometime about 3 years now. Fortunately there does not appear to be any signs of active infection systemically at this point. I think that the patient has done well with the Santyl over the past week which is good news. Patient's caregiver which is her daughter-in-law is concerned about the fact that she really does not feel qualified to be able to change the dressings and take care of this issue. There does not appear to be any signs of anything untoward going on at this point. I think she is done a great job applying the [employer] and I think that has done a great job for the patient is for soften up some of the necrotic tissue. With that being said I think the Xeroform on the arm also has done excellent. In general I am very pleased with where we stand. And I told the patient's  daughter-in- law as well that I also feel like she has done a great job over the past week taking care of her mother. 11/11/2020 upon evaluation today patient appears to be doing well with regard to her wounds. She has been  tolerating the dressing changes without complication her daughters been applying the Santyl which has done a great job. Ho with that being said I think now that we have good arterial study showing we can go ahead and proceed with sharp debridement at this point. 11/25/2020 upon evaluation today patient appears to be doing well with regard to her wounds. The Santyl has really helped to clean things up and overall she is doing quite excellent at this point. Fortunately there does not appear to be any signs of infection which is great news and in general extremely pleased. I do believe some debridement is in order and hopefully will be able to get her into a collagen dressing after this. 12/23/2020 upon evaluation today patient appears to be doing well with regard to her wound all things considered. Unfortunately it does sound like her daughter decided to let this air out because she felt like it was getting so wet. It sounds like she got border gauze dressings instead of border foam therefore there is really Nothing to catch the excess drainage which I think is the problem they were worried about the smell. Fortunately there does not appear to be any signs of active infection which is great news. Nonetheless I do not think we want to leave this just open to air. 01/13/2021 upon evaluation today patient's wounds actually appear to be about as good as have seen since have been taking care of her. Fortunately there does not appear to be any evidence of infection which is great and overall the biggest issue I see is that of fluid buildup. I think we need to do something to try to help manage this. I think if we can control her edema we get the wounds to heal more effectively. 01/27/2021 upon evaluation today patient appears to be doing well in regard to the wounds on her right lateral malleolus as well as the left heel and the right ischial tuberosity is unfortunately a new area that has arisen since we last saw  her. She tells me currently that that is from sitting too much. With that being said this actually appears to be doing the worst of anything so far that I see today. Fortunately there does not appear to be any signs of infection this is at least good news. Compression wrap seem to be doing excellent for her as far as the legs are concerned. 02/03/2021 upon evaluation today patient appears to be doing well with regard to all of her wounds. She has been tolerating the dressing changes without complication. Fortunately there is no signs of active infection at this time. No fevers, chills, nausea, vomiting, or diarrhea. 02/10/2021 upon evaluation today patient appears to be doing well with regard to her wounds. With that being said there is some slight evidence of hypergranulation in regard to the right ankle in particular and a little bit in regard to the left heel. I think Hydrofera Blue might be a better option to go to at this point based on what I am seeing especially since both of these wounds in particular seem to be a little bit stalled. I discussed that with the patient and her daughter today. With regard to the hip this is doing great with the  collagen I think will get very close to complete closure I recommend we continue with the collagen. Electronic Signature(s) Signed: 02/10/2021 4:13:20 PM By: Worthy Keeler PA-C Entered By: Worthy Keeler on 02/10/2021 16:13:20 [name], [name] ([id number]) -------------------------------------------------------------------------------- Physical Exam Details Patient Name: ZOOEY, MCKANE Date of Service: 02/10/2021 3:00 PM Medical Record Number: [id number] Patient Account Number: [id number] Date of Birth/Sex: February 07, 1928 (87 y.o. F) Treating RN: Donnamarie Poag Primary Care Provider: Viviana Simpler Other Clinician: Referring Provider: Viviana Simpler Treating Provider/Extender: Skipper Cliche in Treatment: 31 Constitutional Well-nourished and  well-hydrated in no acute distress. Respiratory normal breathing without difficulty. Psychiatric this patient is able to make decisions and demonstrates good insight into disease process. Alert and Oriented x 3. pleasant and cooperative. Notes Patient's wounds actually did not require any sharp debridement today as the surface appears to be doing decently well. With regard to the hypergranulation I think that the [facility] will probably take care of this. She also really did not want me to perform any debridement due to pain. Again there is really no significant slough buildup so I think that is okay and safe to do at this time we will see how things look at the next visit next week. Electronic Signature(s) Signed: 02/10/2021 4:13:44 PM By: Worthy Keeler PA-C Entered By: Worthy Keeler on 02/10/2021 16:13:43 [name], [name] ([id number]) -------------------------------------------------------------------------------- Physician Orders Details Patient Name: [name], [name] Date of Service: 02/10/2021 3:00 PM Medical Record Number: [id number] Patient Account Number: [id number] Date of Birth/Sex: [birth year] (87 y.o. F) Treating RN: Donnamarie Poag Primary Care Provider: Viviana Simpler Other Clinician: Referring Provider: Viviana Simpler Treating Provider/Extender: Skipper Cliche in Treatment: 16 Verbal / Phone Orders: No Diagnosis Coding ICD-10 Coding Code Description L89.513 Pressure ulcer of right ankle, stage 3 L89.623 Pressure ulcer of left heel, stage 3 S51.802A Unspecified open wound of left forearm, initial encounter L89.312 Pressure ulcer of right buttock, stage 2 I10 Essential (primary) hypertension I25.10 Atherosclerotic heart disease of native coronary artery without angina pectoris I73.89 Other specified peripheral vascular diseases Follow-up Appointments o Return Appointment in 1 week. - May do nurse visit to allow weekly visits if needed o Nurse Visit as  needed Bathing/ Shower/ Hygiene o May shower with wound dressing protected with water repellent cover or cast protector. o No tub bath. Edema Control - Lymphedema / Segmental Compressive Device / Other Bilateral Lower Extremities o Optional: One layer of unna paste to top of compression wrap (to act as an anchor). o Elevate legs to the level of the heart and pump ankles as often as possible o Elevate leg(s) parallel to the floor when sitting. o DO YOUR BEST to sleep in the bed at night. DO NOT sleep in your recliner. Long hours of sitting in a recliner leads to swelling of the legs and/or potential wounds on your backside. Off-Loading o Gel wheelchair cushion - prop when sitting or laying to avoid pressure on your hip wound o Turn and reposition every 2 hours Wound Treatment Wound #1 - Malleolus Wound Laterality: Right, Lateral Cleanser: Soap and Water 1 x Per Day/30 Days Discharge Instructions: Gently cleanse wound with antibacterial soap, rinse and pat dry prior to dressing wounds Primary Dressing: Hydrofera Blue Ready Transfer Foam, 2.5x2.5 (in/in) 1 x Per Day/30 Days Discharge Instructions: Apply Hydrofera Blue Ready to wound bed as directed Secondary Dressing: ABD Pad 5x9 (in/in) 1 x Per Day/30 Days Discharge Instructions: Cover with ABD pad-also pad upper shin with extra ABD  pad Compression Wrap: Profore Lite LF 3 Multilayer Compression Bandaging System 1 x Per Day/30 Days Discharge Instructions: Apply 3 multi-layer wrap as prescribed. Wound #2 - Calcaneus Wound Laterality: Left Cleanser: Soap and Water 1 x Per Day/30 Days Discharge Instructions: Gently cleanse wound with antibacterial soap, rinse and pat dry prior to dressing wounds Primary Dressing: Hydrofera Blue Ready Transfer Foam, 2.5x2.5 (in/in) 1 x Per Day/30 Days Discharge Instructions: Apply Hydrofera Blue Ready to wound bed as directed Secondary Dressing: ABD Pad 5x9 (in/in) 1 x Per Day/30 Days Discharge  Instructions: Cover with ABD pad [name], [name] ([id number]) Compression Wrap: Profore Lite LF 3 Multilayer Compression Bandaging System 1 x Per Day/30 Days Discharge Instructions: Apply 3 multi-layer wrap as prescribed. Wound #4 - Ischial Tuberosity Wound Laterality: Right Cleanser: Soap and Water 3 x Per Week/30 Days Discharge Instructions: Gently cleanse wound with antibacterial soap, rinse and pat dry prior to dressing wounds Primary Dressing: Prisma 4.34 (in) (Generic) 3 x Per Week/30 Days Discharge Instructions: Moisten w/normal saline or sterile water; Cover wound as directed. Do not remove from wound bed. Secondary Dressing: Mepilex Border Flex, 4x4 (in/in) (Generic) 3 x Per Week/30 Days Discharge Instructions: Apply to wound as directed. Do not cut. Electronic Signature(s) Signed: 02/10/2021 4:15:24 PM By: Donnamarie Poag Signed: 02/10/2021 6:21:30 PM By: Worthy Keeler PA-C Entered By: Donnamarie Poag on 02/10/2021 16:10:51 [name], [name] ([id number]) -------------------------------------------------------------------------------- Problem List Details Patient Name: [name], [name] Date of Service: 02/10/2021 3:00 PM Medical Record Number: [id number] Patient Account Number: [id number] Date of Birth/Sex: May 21, 1928 (87 y.o. F) Treating RN: Donnamarie Poag Primary Care Provider: Viviana Simpler Other Clinician: Referring Provider: Viviana Simpler Treating Provider/Extender: Skipper Cliche in Treatment: 16 Active Problems ICD-10 Encounter Code Description Active Date MDM Diagnosis L89.513 Pressure ulcer of right ankle, stage 3 10/21/2020 No Yes L89.623 Pressure ulcer of left heel, stage 3 10/21/2020 No Yes S51.802A Unspecified open wound of left forearm, initial encounter 10/21/2020 No Yes L89.312 Pressure ulcer of right buttock, stage 2 01/27/2021 No Yes I10 Essential (primary) hypertension 10/21/2020 No Yes I25.10 Atherosclerotic heart disease of native coronary artery without angina  10/21/2020 No Yes pectoris I73.89 Other specified peripheral vascular diseases 10/21/2020 No Yes Inactive Problems Resolved Problems Electronic Signature(s) Signed: 02/10/2021 3:07:13 PM By: Worthy Keeler PA-C Entered By: Worthy Keeler on 02/10/2021 15:07:13 [name], [name] ([id number]) -------------------------------------------------------------------------------- Progress Note Details Patient Name: [name] Date of Service: 02/10/2021 3:00 PM Medical Record Number: [id number] Patient Account Number: [id number] Date of Birth/Sex: 1928-04-17 (87 y.o. F) Treating RN: Donnamarie Poag Primary Care Provider: Viviana Simpler Other Clinician: Referring Provider: Viviana Simpler Treating Provider/Extender: Skipper Cliche in Treatment: 16 Subjective Chief Complaint Information obtained from Patient Pressure ulcer right ankle and left heel and right ischial/buttock pressure ulcer History of Present Illness (HPI) 10/21/2020 upon evaluation today patient presents for initial inspection here in our clinic concerning issues that she has been having quite some time in regard to her ankle and since she has been in the hospital with regard to the heel. The ankle in fact has been 5-6 years at least I am told. With that being said the heel ulcer occurred when she was in the hospital in December for hip surgery when she fractured her hip. She also was in the hospital Tuesday for altered mental status. Really there was nothing that was identified as the cause for this. She did have a. Fortunately there does not appear to be any signs of infection she  tells me that she has had she believes arterial studies At [facility] clinic I could not find Those studies at this point. Nonetheless I will continue to look and see what I can find before Then. The patient also has a skin tear on her arm this occurred more recently when she bumped this at home. The patient does have a history of hypertension, coronary  artery disease, and peripheral vascular disease stated. 10/28/2020 I was able to find the patient's chart currently which shows that she did have an arterial study performed in May 2019. This showed that she had a abnormal right toe brachial index and a normal left toe brachial index. She was noncompressible as far as ABIs were concerned. She did appear to have triphasic flow at that time. Unfortunately the wound that is commented on in the report that I printed off and read mentions the same wound on the ankle that we are still dealing with at this point. Unfortunately this has not healed. And its been quite sometime about 3 years now. Fortunately there does not appear to be any signs of active infection systemically at this point. I think that the patient has done well with the Santyl over the past week which is good news. Patient's caregiver which is her daughter-in-law is concerned about the fact that she really does not feel qualified to be able to change the dressings and take care of this issue. There does not appear to be any signs of anything untoward going on at this point. I think she is done a great job applying the [employer] and I think that has done a great job for the patient is for soften up some of the necrotic tissue. With that being said I think the Xeroform on the arm also has done excellent. In general I am very pleased with where we stand. And I told the patient's daughter-in- law as well that I also feel like she has done a great job over the past week taking care of her mother. 11/11/2020 upon evaluation today patient appears to be doing well with regard to her wounds. She has been tolerating the dressing changes without complication her daughters been applying the Santyl which has done a great job. Ho with that being said I think now that we have good arterial study showing we can go ahead and proceed with sharp debridement at this point. 11/25/2020 upon evaluation today patient  appears to be doing well with regard to her wounds. The Santyl has really helped to clean things up and overall she is doing quite excellent at this point. Fortunately there does not appear to be any signs of infection which is great news and in general extremely pleased. I do believe some debridement is in order and hopefully will be able to get her into a collagen dressing after this. 12/23/2020 upon evaluation today patient appears to be doing well with regard to her wound all things considered. Unfortunately it does sound like her daughter decided to let this air out because she felt like it was getting so wet. It sounds like she got border gauze dressings instead of border foam therefore there is really Nothing to catch the excess drainage which I think is the problem they were worried about the smell. Fortunately there does not appear to be any signs of active infection which is great news. Nonetheless I do not think we want to leave this just open to air. 01/13/2021 upon evaluation today patient's wounds actually appear to  be about as good as have seen since have been taking care of her. Fortunately there does not appear to be any evidence of infection which is great and overall the biggest issue I see is that of fluid buildup. I think we need to do something to try to help manage this. I think if we can control her edema we get the wounds to heal more effectively. 01/27/2021 upon evaluation today patient appears to be doing well in regard to the wounds on her right lateral malleolus as well as the left heel and the right ischial tuberosity is unfortunately a new area that has arisen since we last saw her. She tells me currently that that is from sitting too much. With that being said this actually appears to be doing the worst of anything so far that I see today. Fortunately there does not appear to be any signs of infection this is at least good news. Compression wrap seem to be doing excellent  for her as far as the legs are concerned. 02/03/2021 upon evaluation today patient appears to be doing well with regard to all of her wounds. She has been tolerating the dressing changes without complication. Fortunately there is no signs of active infection at this time. No fevers, chills, nausea, vomiting, or diarrhea. 02/10/2021 upon evaluation today patient appears to be doing well with regard to her wounds. With that being said there is some slight evidence of hypergranulation in regard to the right ankle in particular and a little bit in regard to the left heel. I think Hydrofera Blue might be a better option to go to at this point based on what I am seeing especially since both of these wounds in particular seem to be a little bit stalled. I discussed that with the patient and her daughter today. With regard to the hip this is doing great with the collagen I think will get very close to complete closure I recommend we continue with the collagen. [name], [name] ([id number]) Objective Constitutional Well-nourished and well-hydrated in no acute distress. Vitals Time Taken: 3:24 PM, Height: 66 in, Weight: 76 lbs, BMI: 12.3, Temperature: 98.2 F, Pulse: 67 bpm, Respiratory Rate: 16 breaths/min, Blood Pressure: 142/65 mmHg. Respiratory normal breathing without difficulty. Psychiatric this patient is able to make decisions and demonstrates good insight into disease process. Alert and Oriented x 3. pleasant and cooperative. General Notes: Patient's wounds actually did not require any sharp debridement today as the surface appears to be doing decently well. With regard to the hypergranulation I think that the [facility] will probably take care of this. She also really did not want me to perform any debridement due to pain. Again there is really no significant slough buildup so I think that is okay and safe to do at this time we will see how things look at the next visit next  week. Integumentary (Hair, Skin) Wound #1 status is Open. Original cause of wound was Gradually Appeared. The date acquired was: 10/22/2014. The wound has been in treatment 16 weeks. The wound is located on the Right,Lateral Malleolus. The wound measures 0.9cm length x 1cm width x 0.1cm depth; 0.707cm^2 area and 0.071cm^3 volume. There is Fat Layer (Subcutaneous Tissue) exposed. There is no tunneling or undermining noted. There is a medium amount of serous drainage noted. The wound margin is flat and intact. There is medium (34-66%) red, pink granulation within the wound bed. There is a medium (34-66%) amount of necrotic tissue within the wound bed  including [place]. Wound #2 status is Open. Original cause of wound was Gradually Appeared. The date acquired was: 07/09/2020. The wound has been in treatment 16 weeks. The wound is located on the Left Calcaneus. The wound measures 0.5cm length x 0.5cm width x 0.1cm depth; 0.196cm^2 area and 0.02cm^3 volume. There is Fat Layer (Subcutaneous Tissue) exposed. There is no tunneling or undermining noted. There is a medium amount of serous drainage noted. There is medium (34-66%) red, pink granulation within the wound bed. There is a medium (34-66%) amount of necrotic tissue within the wound bed including Adherent Slough. Wound #4 status is Open. Original cause of wound was Blister. The date acquired was: 01/16/2021. The wound has been in treatment 2 weeks. The wound is located on the Right Ischial Tuberosity. The wound measures 1cm length x 1cm width x 0.1cm depth; 0.785cm^2 area and 0.079cm^3 volume. There is Fat Layer (Subcutaneous Tissue) exposed. There is no tunneling or undermining noted. There is a medium amount of serosanguineous drainage noted. There is medium (34-66%) pink granulation within the wound bed. There is a medium (34-66%) amount of necrotic tissue within the wound bed including Adherent Slough. Assessment Active  Problems ICD-10 Pressure ulcer of right ankle, stage 3 Pressure ulcer of left heel, stage 3 Unspecified open wound of left forearm, initial encounter Pressure ulcer of right buttock, stage 2 Essential (primary) hypertension Atherosclerotic heart disease of native coronary artery without angina pectoris Other specified peripheral vascular diseases Procedures Wound #1 Pre-procedure diagnosis of Wound #1 is a Pressure Ulcer located on the Right,Lateral Malleolus . There was a Three Layer Compression Therapy Procedure by Donnamarie Poag, RN. Post procedure Diagnosis Wound #1: Same as Pre-Procedure Wound #2 Pre-procedure diagnosis of Wound #2 is a Pressure Ulcer located on the Left Calcaneus . There was a Three Layer Compression Therapy Procedure by Donnamarie Poag, RN. [name], [name] ([id number]) Post procedure Diagnosis Wound #2: Same as Pre-Procedure Plan Follow-up Appointments: Return Appointment in 1 week. - May do nurse visit to allow weekly visits if needed Nurse Visit as needed Bathing/ Shower/ Hygiene: May shower with wound dressing protected with water repellent cover or cast protector. No tub bath. Edema Control - Lymphedema / Segmental Compressive Device / Other: Optional: One layer of unna paste to top of compression wrap (to act as an anchor). Elevate legs to the level of the heart and pump ankles as often as possible Elevate leg(s) parallel to the floor when sitting. DO YOUR BEST to sleep in the bed at night. DO NOT sleep in your recliner. Long hours of sitting in a recliner leads to swelling of the legs and/or potential wounds on your backside. Off-Loading: Gel wheelchair cushion - prop when sitting or laying to avoid pressure on your hip wound Turn and reposition every 2 hours WOUND #1: - Malleolus Wound Laterality: Right, Lateral Cleanser: Soap and Water 1 x Per Day/30 Days Discharge Instructions: Gently cleanse wound with antibacterial soap, rinse and pat dry prior to  dressing wounds Primary Dressing: Hydrofera Blue Ready Transfer Foam, 2.5x2.5 (in/in) 1 x Per Day/30 Days Discharge Instructions: Apply Hydrofera Blue Ready to wound bed as directed Secondary Dressing: ABD Pad 5x9 (in/in) 1 x Per Day/30 Days Discharge Instructions: Cover with ABD pad-also pad upper shin with extra ABD pad Compression Wrap: Profore Lite LF 3 Multilayer Compression Bandaging System 1 x Per Day/30 Days Discharge Instructions: Apply 3 multi-layer wrap as prescribed. WOUND #2: - Calcaneus Wound Laterality: Left Cleanser: Soap and Water 1 x Per  Day/30 Days Discharge Instructions: Gently cleanse wound with antibacterial soap, rinse and pat dry prior to dressing wounds Primary Dressing: Hydrofera Blue Ready Transfer Foam, 2.5x2.5 (in/in) 1 x Per Day/30 Days Discharge Instructions: Apply Hydrofera Blue Ready to wound bed as directed Secondary Dressing: ABD Pad 5x9 (in/in) 1 x Per Day/30 Days Discharge Instructions: Cover with ABD pad Compression Wrap: Profore Lite LF 3 Multilayer Compression Bandaging System 1 x Per Day/30 Days Discharge Instructions: Apply 3 multi-layer wrap as prescribed. WOUND #4: - Ischial Tuberosity Wound Laterality: Right Cleanser: Soap and Water 3 x Per Week/30 Days Discharge Instructions: Gently cleanse wound with antibacterial soap, rinse and pat dry prior to dressing wounds Primary Dressing: Prisma 4.34 (in) (Generic) 3 x Per Week/30 Days Discharge Instructions: Moisten w/normal saline or sterile water; Cover wound as directed. Do not remove from wound bed. Secondary Dressing: Mepilex Border Flex, 4x4 (in/in) (Generic) 3 x Per Week/30 Days Discharge Instructions: Apply to wound as directed. Do not cut. 1. Would recommend currently that we initiate treatment with Hydrofera Blue to the heel and the ankle ulcers of her lower extremities. 2. I am also can recommend continue treatment with silver collagen to the right hip location. 3. I am also can recommend  that we continue with the compression wraps I think that is doing a great job again this is a 3 layer compression wrap bilaterally. We will see patient back for reevaluation in 1 week here in the clinic. If anything worsens or changes patient will contact our office for additional recommendations. Electronic Signature(s) Signed: 02/10/2021 4:14:35 PM By: Worthy Keeler PA-C Entered By: Worthy Keeler on 02/10/2021 16:14:34 [place], [name] ([id number]) -------------------------------------------------------------------------------- SuperBill Details Patient Name: LARITA, BOWENS Date of Service: 02/10/2021 Medical Record Number: [id number] Patient Account Number: [id number] Date of Birth/Sex: 11-12-1927 (87 y.o. F) Treating RN: Donnamarie Poag Primary Care Provider: Viviana Simpler Other Clinician: Referring Provider: Viviana Simpler Treating Provider/Extender: Skipper Cliche in Treatment: 16 Diagnosis Coding ICD-10 Codes Code Description [phone] Pressure ulcer of right ankle, stage 3 L89.623 Pressure ulcer of left heel, stage 3 S51.802A Unspecified open wound of left forearm, initial encounter L89.312 Pressure ulcer of right buttock, stage 2 I10 Essential (primary) hypertension I25.10 Atherosclerotic heart disease of native coronary artery without angina pectoris I73.89 Other specified peripheral vascular diseases Facility Procedures CPT4: Description Modifier Quantity Code [id number] BILATERAL: Application of multi-layer venous compression system; leg (below knee), including 1 ankle and foot. Physician Procedures CPT4 Code: [id number] Description: [id number] - WC PHYS LEVEL 4 - EST PT Modifier: Quantity: 1 CPT4 Code: Description: ICD-10 Diagnosis Description L89.513 Pressure ulcer of right ankle, stage 3 L89.623 Pressure ulcer of left heel, stage 3 S51.802A Unspecified open wound of left forearm, initial encounter L89.312 Pressure ulcer of right buttock, stage  2 Modifier: Quantity: Electronic Signature(s) Signed: 02/10/2021 4:15:28 PM By: Worthy Keeler PA-C Previous Signature: 02/10/2021 4:15:24 PM Version By: Donnamarie Poag Entered By: Worthy Keeler on 02/10/2021 16:15:28

## 2021-02-10 NOTE — Progress Notes (Addendum)
[name], [name] ([id number]) Visit Report for 02/10/2021 Arrival Information Details Patient Name: [name], [name] Date of Service: 02/10/2021 3:00 PM Medical Record Number: [id number] Patient Account Number: [id number] Date of Birth/Sex: 26-Mar-1928 (87 y.o. F) Treating RN: Donnamarie Poag Primary Care Delonta Yohannes: Viviana Simpler Other Clinician: Referring Tayvon Culley: Viviana Simpler Treating Ioana Louks/Extender: Skipper Cliche in Treatment: 16 Visit Information History Since Last Visit Added or deleted any medications: No Patient Arrived: [name] Had a fall or experienced change in No Arrival Time: 15:13 activities of daily living that may affect Accompanied By: daughter in law risk of falls: Transfer Assistance: None Signs or symptoms of abuse/neglect since last visito No Patient Identification Verified: Yes Hospitalized since last visit: No Secondary Verification Process Completed: Yes Has Dressing in Place as Prescribed: Yes Patient Has Alerts: Yes Has Compression in Place as Prescribed: Yes Patient Alerts: NOT DIABETIC Pain Present Now: No TBI left .77 TBI right .76 Electronic Signature(s) Signed: 02/10/2021 4:15:24 PM By: Donnamarie Poag Entered By: Donnamarie Poag on 02/10/2021 15:22:21 [name] ([id number]) -------------------------------------------------------------------------------- Clinic Level of Care Assessment Details Patient Name: [name], [name] Date of Service: 02/10/2021 3:00 PM Medical Record Number: [id number] Patient Account Number: [id number] Date of Birth/Sex: 1928/04/18 (87 y.o. F) Treating RN: Donnamarie Poag Primary Care Lynnzie Blackson: Viviana Simpler Other Clinician: Referring Jarell Mcewen: Viviana Simpler Treating Jamariya Davidoff/Extender: Skipper Cliche in Treatment: 16 Clinic Level of Care Assessment Items TOOL 1 Quantity Score _0  - Use when EandM and Procedure is performed on INITIAL visit 0 ASSESSMENTS - Nursing Assessment / Reassessment _1  - General Physical Exam  (combine w/ comprehensive assessment (listed just below) when performed on new 0 pt. evals) _2  - 0 Comprehensive Assessment (HX, ROS, Risk Assessments, Wounds Hx, etc.) ASSESSMENTS - Wound and Skin Assessment / Reassessment _3  - Dermatologic / Skin Assessment (not related to wound area) 0 ASSESSMENTS - Ostomy and/or Continence Assessment and Care _4  - Incontinence Assessment and Management 0 _5  - 0 Ostomy Care Assessment and Management (repouching, etc.) PROCESS - Coordination of Care _6  - Simple Patient / Family Education for ongoing care 0 _7  - 0 Complex (extensive) Patient / Family Education for ongoing care _8  - 0 Staff obtains Programmer, systems, Records, Test Results / Process Orders _9  - 0 Staff telephones HHA, Nursing Homes / Clarify orders / etc _10  - 0 Routine Transfer to another Facility (non-emergent condition) _11  - 0 Routine Hospital Admission (non-emergent condition) _12  - 0 New Admissions / Biomedical engineer / Ordering NPWT, Apligraf, etc. _13  - 0 Emergency Hospital Admission (emergent condition) PROCESS - Special Needs _14  - Pediatric / Minor Patient Management 0 _15  - 0 Isolation Patient Management _16  - 0 Hearing / Language / Visual special needs _17  - 0 Assessment of Community assistance (transportation, D/C planning, etc.) _18  - 0 Additional assistance / Altered mentation _19  - 0 Support Surface(s) Assessment (bed, cushion, seat, etc.) INTERVENTIONS - Miscellaneous _20  - External ear exam 0 _21  - 0 Patient Transfer (multiple staff / Civil Service fast streamer / Similar devices) _22  - 0 Simple Staple / Suture removal (25 or less) _23  - 0 Complex Staple / Suture removal (26 or more) _24  - 0 Hypo/Hyperglycemic Management (do not check if billed separately) _25  - 0 Ankle / Brachial Index (ABI) - do not check if billed separately Has the patient been seen at the hospital within the last three years: Yes Total Score: 0 Level Of Care: ____ [name] ([id number]) Electronic  Signature(s) Signed: 02/10/2021 4:15:24 PM By: Donnamarie Poag Entered By: Donnamarie Poag on  02/10/2021 16:10:57 Timmers, [name] ([id number]) -------------------------------------------------------------------------------- Compression Therapy Details Patient Name: [name], [name] Date of Service: 02/10/2021 3:00 PM Medical Record Number: [id number] Patient Account Number: [id number] Date of Birth/Sex: [birth year] (87 y.o. F) Treating RN: Donnamarie Poag Primary Care Braiden Presutti: Viviana Simpler Other Clinician: Referring Thaddeus Evitts: Viviana Simpler Treating Antoinne Spadaccini/Extender: Skipper Cliche in Treatment: 16 Compression Therapy Performed for Wound Assessment: Wound #2 Left Calcaneus Performed By: Clinician Donnamarie Poag, RN Compression Type: Three Layer Post Procedure Diagnosis Same as Pre-procedure Electronic Signature(s) Signed: 02/10/2021 4:15:24 PM By: Donnamarie Poag Entered By: Donnamarie Poag on 02/10/2021 16:08:22 [name] ([id number]) -------------------------------------------------------------------------------- Compression Therapy Details Patient Name: [name], [name] Date of Service: 02/10/2021 3:00 PM Medical Record Number: [id number] Patient Account Number: [id number] Date of Birth/Sex: 1927-08-28 (87 y.o. F) Treating RN: Donnamarie Poag Primary Care Takelia Urieta: Viviana Simpler Other Clinician: Referring Kalynne Womac: Viviana Simpler Treating Jeanenne Licea/Extender: Skipper Cliche in Treatment: 16 Compression Therapy Performed for Wound Assessment: Wound #1 Right,Lateral Malleolus Performed By: Junius Argyle, RN Compression Type: Three Layer Post Procedure Diagnosis Same as Pre-procedure Electronic Signature(s) Signed: 02/10/2021 4:15:24 PM By: Donnamarie Poag Entered By: Donnamarie Poag on 02/10/2021 Newald, [name] ([id number]) -------------------------------------------------------------------------------- Encounter Discharge Information Details Patient Name: [name], [name] Date of  Service: 02/10/2021 3:00 PM Medical Record Number: [id number] Patient Account Number: [id number] Date of Birth/Sex: December 25, 1927 (87 y.o. F) Treating RN: Donnamarie Poag Primary Care Obed Samek: Viviana Simpler Other Clinician: Referring Asiel Chrostowski: Viviana Simpler Treating Kambria Grima/Extender: Skipper Cliche in Treatment: 16 Encounter Discharge Information Items Discharge Condition: Stable Ambulatory Status: Walker Discharge Destination: Home Transportation: Private Auto Accompanied By: daughter in law Schedule Follow-up Appointment: Yes Clinical Summary of Care: Electronic Signature(s) Signed: 02/10/2021 4:15:24 PM By: Donnamarie Poag Entered By: Donnamarie Poag on 02/10/2021 16:15:11 Solanki, [name] ([id number]) -------------------------------------------------------------------------------- Lower Extremity Assessment Details Patient Name: [name], [name] Date of Service: 02/10/2021 3:00 PM Medical Record Number: [id number] Patient Account Number: [id number] Date of Birth/Sex: 17-Jun-1928 (87 y.o. F) Treating RN: Donnamarie Poag Primary Care Timm Bonenberger: Viviana Simpler Other Clinician: Referring Kizzy Olafson: Viviana Simpler Treating Jadon Harbaugh/Extender: Skipper Cliche in Treatment: 16 Edema Assessment Assessed: Shirlyn Goltz: Yes] Patrice Paradise: Yes] Edema: [Left: Yes] [Right: Yes] Calf Left: Right: Point of Measurement: 33 cm From Medial Instep 22.5 cm 23 cm Ankle Left: Right: Point of Measurement: 9 cm From Medial Instep 19 cm 19 cm Vascular Assessment Pulses: Dorsalis Pedis Palpable: [Left:Yes] [Right:Yes] Electronic Signature(s) Signed: 02/10/2021 4:15:24 PM By: Donnamarie Poag Entered By: Donnamarie Poag on 02/10/2021 15:40:56 Koper, [name] ([id number]) -------------------------------------------------------------------------------- Multi Wound Chart Details Patient Name: [name] Date of Service: 02/10/2021 3:00 PM Medical Record Number: [id number] Patient Account Number: [id number] Date of Birth/Sex:  02/05/1928 (87 y.o. F) Treating RN: Donnamarie Poag Primary Care Haley Roza: Viviana Simpler Other Clinician: Referring Lamyiah Crawshaw: Viviana Simpler Treating Taj Nevins/Extender: Skipper Cliche in Treatment: 16 Vital Signs Height(in): 58 Pulse(bpm): 44 Weight(lbs): 68 Blood Pressure(mmHg): 142/65 Body Mass Index(BMI): 12 Temperature(F): 98.2 Respiratory Rate(breaths/min): 16 Photos: Wound Location: Right, Lateral Malleolus Left Calcaneus Right Ischial Tuberosity Wounding Event: Gradually Appeared Gradually Appeared Blister Primary Etiology: Pressure Ulcer Pressure Ulcer Pressure Ulcer Comorbid History: Arrhythmia, Coronary Artery Arrhythmia, Coronary Artery Arrhythmia, Coronary Artery Disease, Hypertension, Peripheral Disease, Hypertension, Peripheral Disease, Hypertension, Peripheral Arterial Disease, History of pressure Arterial Disease, History of pressure Arterial Disease, History of pressure wounds, Osteoarthritis wounds, Osteoarthritis wounds, Osteoarthritis Date Acquired: 10/22/2014 07/09/2020 01/16/2021 Weeks of Treatment: _0 Wound Status: Open Open Open Measurements L x W x D (cm) 0.9x1x0.1 0.5x0.5x0.1 1x1x0.1  Area (cm) : 0.707 0.196 0.785 Volume (cm) : 0.071 0.02 0.079 % Reduction in Area: 9.90% 87.20% 73.60% % Reduction in Volume: 10.10% 86.90% 73.40% Classification: Category/Stage III Unstageable/Unclassified Category/Stage III Exudate Amount: Medium Medium Medium Exudate Type: Serous Serous Serosanguineous Exudate Color: amber amber red, brown Wound Margin: Flat and Intact N/A N/A Granulation Amount: Medium (34-66%) Medium (34-66%) Medium (34-66%) Granulation Quality: Red, Pink Red, Pink Pink Necrotic Amount: Medium (34-66%) Medium (34-66%) Medium (34-66%) Exposed Structures: Fat Layer (Subcutaneous Tissue): Fat Layer (Subcutaneous Tissue): Fat Layer (Subcutaneous Tissue): Yes Yes Yes Fascia: No Fascia: No Fascia: No Tendon: No Tendon: No Tendon: No Muscle:  No Muscle: No Muscle: No Joint: No Joint: No Joint: No Bone: No Bone: No Bone: No Epithelialization: None None Small (1-33%) Treatment Notes Electronic Signature(s) Signed: 02/10/2021 4:15:24 PM By: Donnamarie Poag Entered By: Donnamarie Poag on 02/10/2021 15:41:30 Melching, [name] ([id number]) -------------------------------------------------------------------------------- Multi-Disciplinary Care Plan Details Patient Name: [name], [name] Date of Service: 02/10/2021 3:00 PM Medical Record Number: [id number] Patient Account Number: [id number] Date of Birth/Sex: 08/07/1927 (87 y.o. F) Treating RN: Donnamarie Poag Primary Care Emmarae Cowdery: Viviana Simpler Other Clinician: Referring Loyda Costin: Viviana Simpler Treating Ahad Colarusso/Extender: Skipper Cliche in Treatment: 16 Active Inactive Wound/Skin Impairment Nursing Diagnoses: Impaired tissue integrity Goals: Patient/caregiver will verbalize understanding of skin care regimen Date Initiated: 10/21/2020 Date Inactivated: 12/23/2020 Target Resolution Date: 12/21/2020 Goal Status: Met Ulcer/skin breakdown will have a volume reduction of 30% by week 4 Date Initiated: 10/21/2020 Date Inactivated: 11/25/2020 Target Resolution Date: 11/20/2020 Goal Status: Met Ulcer/skin breakdown will have a volume reduction of 50% by week 8 Date Initiated: 10/21/2020 Date Inactivated: 12/23/2020 Target Resolution Date: 12/21/2020 Goal Status: Met Ulcer/skin breakdown will have a volume reduction of 80% by week 12 Date Initiated: 10/21/2020 Target Resolution Date: 01/20/2021 Goal Status: Active Ulcer/skin breakdown will heal within 14 weeks Date Initiated: 10/21/2020 Target Resolution Date: 02/20/2021 Goal Status: Active Interventions: Assess patient/caregiver ability to obtain necessary supplies Assess patient/caregiver ability to perform ulcer/skin care regimen upon admission and as needed Assess ulceration(s) every visit Notes: Electronic Signature(s) Signed:  02/10/2021 4:15:24 PM By: Donnamarie Poag Entered By: Donnamarie Poag on 02/10/2021 15:41:14 Imai, [name] ([id number]) -------------------------------------------------------------------------------- Pain Assessment Details Patient Name: [name], [name] Date of Service: 02/10/2021 3:00 PM Medical Record Number: [id number] Patient Account Number: [id number] Date of Birth/Sex: Dec 06, 1927 (87 y.o. F) Treating RN: Donnamarie Poag Primary Care Guliana Weyandt: Viviana Simpler Other Clinician: Referring Teigen Bellin: Viviana Simpler Treating Shaniyah Wix/Extender: Skipper Cliche in Treatment: 16 Active Problems Location of Pain Severity and Description of Pain Patient Has Paino No Site Locations Rate the pain. Current Pain Level: 0 Pain Management and Medication Current Pain Management: Electronic Signature(s) Signed: 02/10/2021 4:15:24 PM By: Donnamarie Poag Entered By: Donnamarie Poag on 02/10/2021 15:25:44 Canner, [name] ([id number]) -------------------------------------------------------------------------------- Patient/Caregiver Education Details Patient Name: [name] Date of Service: 02/10/2021 3:00 PM Medical Record Number: [id number] Patient Account Number: [id number] Date of Birth/Gender: December 22, 1927 (87 y.o. F) Treating RN: Donnamarie Poag Primary Care Physician: Viviana Simpler Other Clinician: Referring Physician: Viviana Simpler Treating Physician/Extender: Skipper Cliche in Treatment: 16 Education Assessment Education Provided To: Patient and Caregiver Education Topics Provided Basic Hygiene: Nutrition: Offloading: Wound/Skin Impairment: Electronic Signature(s) Signed: 02/10/2021 4:15:24 PM By: Donnamarie Poag Entered By: Donnamarie Poag on 02/10/2021 16:14:32 Jagoda, [name] ([id number]) -------------------------------------------------------------------------------- Wound Assessment Details Patient Name: [name], [name] Date of Service: 02/10/2021 3:00 PM Medical Record Number: [id number] Patient  Account Number: [id number] Date of Birth/Sex: [birth year] (87 y.o. F) Treating RN:  Donnamarie Poag Primary Care Kaius Daino: Viviana Simpler Other Clinician: Referring Dashonda Bonneau: Viviana Simpler Treating Nalia Honeycutt/Extender: Skipper Cliche in Treatment: 16 Wound Status Wound Number: 1 Primary Pressure Ulcer Etiology: Wound Location: Right, Lateral Malleolus Wound Open Wounding Event: Gradually Appeared Status: Date Acquired: 10/22/2014 Comorbid Arrhythmia, Coronary Artery Disease, Hypertension, Weeks Of Treatment: 16 History: Peripheral Arterial Disease, History of pressure wounds, Clustered Wound: No Osteoarthritis Photos Wound Measurements Length: (cm) 0.9 Width: (cm) 1 Depth: (cm) 0.1 Area: (cm) 0.707 Volume: (cm) 0.071 % Reduction in Area: 9.9% % Reduction in Volume: 10.1% Epithelialization: None Tunneling: No Undermining: No Wound Description Classification: Category/Stage III Wound Margin: Flat and Intact Exudate Amount: Medium Exudate Type: Serous Exudate Color: amber Foul Odor After Cleansing: No Slough/Fibrino Yes Wound Bed Granulation Amount: Medium (34-66%) Exposed Structure Granulation Quality: Red, Pink Fascia Exposed: No Necrotic Amount: Medium (34-66%) Fat Layer (Subcutaneous Tissue) Exposed: Yes Necrotic Quality: Adherent Slough Tendon Exposed: No Muscle Exposed: No Joint Exposed: No Bone Exposed: No Treatment Notes Wound #1 (Malleolus) Wound Laterality: Right, Lateral Cleanser Soap and Water Discharge Instruction: Gently cleanse wound with antibacterial soap, rinse and pat dry prior to dressing wounds CATALINA, SALASAR ([id number]) Peri-Wound Care Topical Primary Dressing Hydrofera Blue Ready Transfer Foam, 2.5x2.5 (in/in) Discharge Instruction: Apply Hydrofera Blue Ready to wound bed as directed Secondary Dressing ABD Pad 5x9 (in/in) Discharge Instruction: Cover with ABD pad-also pad upper shin with extra ABD pad Secured With Compression  Wrap Profore Lite LF 3 Multilayer Compression Bandaging System Discharge Instruction: Apply 3 multi-layer wrap as prescribed. Compression Stockings Add-Ons Electronic Signature(s) Signed: 02/10/2021 4:15:24 PM By: Donnamarie Poag Entered By: Donnamarie Poag on 02/10/2021 15:37:18 Massoud, [name] ([id number]) -------------------------------------------------------------------------------- Wound Assessment Details Patient Name: [name], [name] Date of Service: 02/10/2021 3:00 PM Medical Record Number: [id number] Patient Account Number: [id number] Date of Birth/Sex: [birth year] (87 y.o. F) Treating RN: Donnamarie Poag Primary Care Gunda Maqueda: Viviana Simpler Other Clinician: Referring Margert Edsall: Viviana Simpler Treating Jermar Colter/Extender: Skipper Cliche in Treatment: 16 Wound Status Wound Number: 2 Primary Pressure Ulcer Etiology: Wound Location: Left Calcaneus Wound Open Wounding Event: Gradually Appeared Status: Date Acquired: 07/09/2020 Comorbid Arrhythmia, Coronary Artery Disease, Hypertension, Weeks Of Treatment: 16 History: Peripheral Arterial Disease, History of pressure wounds, Clustered Wound: No Osteoarthritis Photos Wound Measurements Length: (cm) 0.5 Width: (cm) 0.5 Depth: (cm) 0.1 Area: (cm) 0.196 Volume: (cm) 0.02 % Reduction in Area: 87.2% % Reduction in Volume: 86.9% Epithelialization: None Tunneling: No Undermining: No Wound Description Classification: Unstageable/Unclassified Exudate Amount: Medium Exudate Type: Serous Exudate Color: amber Foul Odor After Cleansing: No Slough/Fibrino Yes Wound Bed Granulation Amount: Medium (34-66%) Exposed Structure Granulation Quality: Red, Pink Fascia Exposed: No Necrotic Amount: Medium (34-66%) Fat Layer (Subcutaneous Tissue) Exposed: Yes Necrotic Quality: Adherent Slough Tendon Exposed: No Muscle Exposed: No Joint Exposed: No Bone Exposed: No Treatment Notes Wound #2 (Calcaneus) Wound Laterality: Left Cleanser Soap  and Water Discharge Instruction: Gently cleanse wound with antibacterial soap, rinse and pat dry prior to dressing wounds Peri-Wound Care NATHALY, DAWKINS ([id number]) Topical Primary Dressing Hydrofera Blue Ready Transfer Foam, 2.5x2.5 (in/in) Discharge Instruction: Apply Hydrofera Blue Ready to wound bed as directed Secondary Dressing ABD Pad 5x9 (in/in) Discharge Instruction: Cover with ABD pad Secured With Compression Wrap Profore Lite LF 3 Multilayer Compression Bandaging System Discharge Instruction: Apply 3 multi-layer wrap as prescribed. Compression Stockings Add-Ons Electronic Signature(s) Signed: 02/10/2021 4:15:24 PM By: Donnamarie Poag Entered By: Donnamarie Poag on 02/10/2021 15:38:02 Carlini, [name] ([id number]) -------------------------------------------------------------------------------- Wound Assessment Details Patient Name: LIAN, POUNDS  Date of Service: 02/10/2021 3:00 PM Medical Record Number: [id number] Patient Account Number: [id number] Date of Birth/Sex: 24-Feb-1928 (87 y.o. F) Treating RN: Donnamarie Poag Primary Care Juda Toepfer: Viviana Simpler Other Clinician: Referring Thyra Yinger: Viviana Simpler Treating Samarra Ridgely/Extender: Skipper Cliche in Treatment: 16 Wound Status Wound Number: 4 Primary Pressure Ulcer Etiology: Wound Location: Right Ischial Tuberosity Wound Open Wounding Event: Blister Status: Date Acquired: 01/16/2021 Comorbid Arrhythmia, Coronary Artery Disease, Hypertension, Weeks Of Treatment: 2 History: Peripheral Arterial Disease, History of pressure wounds, Clustered Wound: No Osteoarthritis Photos Wound Measurements Length: (cm) 1 Width: (cm) 1 Depth: (cm) 0.1 Area: (cm) 0.785 Volume: (cm) 0.079 % Reduction in Area: 73.6% % Reduction in Volume: 73.4% Epithelialization: Small (1-33%) Tunneling: No Undermining: No Wound Description Classification: Category/Stage III Exudate Amount: Medium Exudate Type: Serosanguineous Exudate Color:  red, brown Foul Odor After Cleansing: No Slough/Fibrino Yes Wound Bed Granulation Amount: Medium (34-66%) Exposed Structure Granulation Quality: Pink Fascia Exposed: No Necrotic Amount: Medium (34-66%) Fat Layer (Subcutaneous Tissue) Exposed: Yes Necrotic Quality: Adherent Slough Tendon Exposed: No Muscle Exposed: No Joint Exposed: No Bone Exposed: No Treatment Notes Wound #4 (Ischial Tuberosity) Wound Laterality: Right Cleanser Soap and Water Discharge Instruction: Gently cleanse wound with antibacterial soap, rinse and pat dry prior to dressing wounds Peri-Wound Care EMEREE, MAHLER ([id number]) Topical Primary Dressing Prisma 4.34 (in) Discharge Instruction: Moisten w/normal saline or sterile water; Cover wound as directed. Do not remove from wound bed. Secondary Dressing Mepilex Border Flex, 4x4 (in/in) Discharge Instruction: Apply to wound as directed. Do not cut. Secured With Compression Wrap Compression Stockings Add-Ons Electronic Signature(s) Signed: 02/10/2021 4:15:24 PM By: Donnamarie Poag Entered By: Donnamarie Poag on 02/10/2021 15:38:40 Fountain, [name] ([id number]) -------------------------------------------------------------------------------- [place] Details Patient Name: [name] Date of Service: 02/10/2021 3:00 PM Medical Record Number: [id number] Patient Account Number: [id number] Date of Birth/Sex: [birth year] (87 y.o. F) Treating RN: Donnamarie Poag Primary Care Burris Matherne: Viviana Simpler Other Clinician: Referring Karstyn Birkey: Viviana Simpler Treating Glen Blatchley/Extender: Skipper Cliche in Treatment: 16 Vital Signs Time Taken: 15:24 Temperature (F): 98.2 Height (in): 66 Pulse (bpm): 67 Weight (lbs): 76 Respiratory Rate (breaths/min): 16 Body Mass Index (BMI): 12.3 Blood Pressure (mmHg): 142/65 Reference Range: 80 - 120 mg / dl Electronic Signature(s) Signed: 02/10/2021 4:15:24 PM By: Donnamarie Poag Entered ByDonnamarie Poag on 02/10/2021 15:25:35

## 2021-02-17 ENCOUNTER — Encounter: Payer: PPO | Admitting: Physician Assistant

## 2021-02-17 ENCOUNTER — Other Ambulatory Visit: Payer: Self-pay

## 2021-02-17 DIAGNOSIS — L89623 Pressure ulcer of left heel, stage 3: Secondary | ICD-10-CM | POA: Diagnosis not present

## 2021-02-17 DIAGNOSIS — L89513 Pressure ulcer of right ankle, stage 3: Secondary | ICD-10-CM | POA: Diagnosis not present

## 2021-02-17 NOTE — Progress Notes (Addendum)
**Note De-Identified [name] Obfuscation** [name], [name] ([id number]) Visit Report for 02/17/2021 Arrival Information Details Patient Name: [name], [name] Date of Service: 02/17/2021 3:00 PM Medical Record Number: [id number] Patient Account Number: [id number] Date of Birth/Sex: 10/18/1927 (87 y.o. F) Treating RN: Donnamarie Poag Primary Care Rheanne Cortopassi: Viviana Simpler Other Clinician: Referring Hanya Guerin: Viviana Simpler Treating Derica Leiber/Extender: Skipper Cliche in Treatment: 35 Visit Information History Since Last Visit Added or deleted any medications: No Patient Arrived: [name] Had a fall or experienced change in No Arrival Time: 15:08 activities of daily living that may affect Accompanied By: daughter in law risk of falls: Transfer Assistance: None Hospitalized since last visit: No Patient Identification Verified: Yes Has Dressing in Place as Prescribed: Yes Secondary Verification Process Completed: Yes Has Compression in Place as Prescribed: Yes Patient Has Alerts: Yes Pain Present Now: No Patient Alerts: NOT DIABETIC TBI left .77 TBI right .76 Electronic Signature(s) Signed: 02/17/2021 4:23:56 PM By: Donnamarie Poag Entered By: Donnamarie Poag on 02/17/2021 15:11:44 [name] ([id number]) -------------------------------------------------------------------------------- Clinic Level of Care Assessment Details Patient Name: [name], [name] Date of Service: 02/17/2021 3:00 PM Medical Record Number: [id number] Patient Account Number: [id number] Date of Birth/Sex: [birth year] (87 y.o. F) Treating RN: Donnamarie Poag Primary Care Shadman Tozzi: Viviana Simpler Other Clinician: Referring Kolbie Lepkowski: Viviana Simpler Treating Ares Tegtmeyer/Extender: Skipper Cliche in Treatment: 17 Clinic Level of Care Assessment Items TOOL 1 Quantity Score _0  - Use when EandM and Procedure is performed on INITIAL visit 0 ASSESSMENTS - Nursing Assessment / Reassessment _1  - General Physical Exam (combine w/ comprehensive assessment (listed just below)  when performed on new 0 pt. evals) _2  - 0 Comprehensive Assessment (HX, ROS, Risk Assessments, Wounds Hx, etc.) ASSESSMENTS - Wound and Skin Assessment / Reassessment _3  - Dermatologic / Skin Assessment (not related to wound area) 0 ASSESSMENTS - Ostomy and/or Continence Assessment and Care _4  - Incontinence Assessment and Management 0 _5  - 0 Ostomy Care Assessment and Management (repouching, etc.) PROCESS - Coordination of Care _6  - Simple Patient / Family Education for ongoing care 0 _7  - 0 Complex (extensive) Patient / Family Education for ongoing care _8  - 0 Staff obtains Programmer, systems, Records, Test Results / Process Orders _9  - 0 Staff telephones HHA, Nursing Homes / Clarify orders / etc _10  - 0 Routine Transfer to another Facility (non-emergent condition) _11  - 0 Routine Hospital Admission (non-emergent condition) _12  - 0 New Admissions / Biomedical engineer / Ordering NPWT, Apligraf, etc. _13  - 0 Emergency Hospital Admission (emergent condition) PROCESS - Special Needs _14  - Pediatric / Minor Patient Management 0 _15  - 0 Isolation Patient Management _16  - 0 Hearing / Language / Visual special needs _17  - 0 Assessment of Community assistance (transportation, D/C planning, etc.) _18  - 0 Additional assistance / Altered mentation _19  - 0 Support Surface(s) Assessment (bed, cushion, seat, etc.) INTERVENTIONS - Miscellaneous _20  - External ear exam 0 _21  - 0 Patient Transfer (multiple staff / Civil Service fast streamer / Similar devices) _22  - 0 Simple Staple / Suture removal (25 or less) _23  - 0 Complex Staple / Suture removal (26 or more) _24  - 0 Hypo/Hyperglycemic Management (do not check if billed separately) _25  - 0 Ankle / Brachial Index (ABI) - do not check if billed separately Has the patient been seen at the hospital within the last three years: Yes Total Score: 0 Level Of Care: ____ [name] ([id number]) Electronic Signature(s) Signed: 02/17/2021 4:23:56 PM By: Donnamarie Poag Entered By: Donnamarie Poag on 02/17/2021 16:03:41 [name] ([id number]) -------------------------------------------------------------------------------- Compression Therapy  Details Patient Name: [name], [name]. Date of Service: 02/17/2021 3:00 PM Medical Record Number: [id number] Patient Account Number: [id number] Date of Birth/Sex: 30-May-1928 (87 y.o. F) Treating RN: Donnamarie Poag Primary Care Kaiyan Luczak: Viviana Simpler Other Clinician: Referring Oren Barella: Viviana Simpler Treating Virgen Belland/Extender: Skipper Cliche in Treatment: 17 Compression Therapy Performed for Wound Assessment: Wound #2 Left Calcaneus Performed By: Clinician Donnamarie Poag, RN Compression Type: Three Layer Post Procedure Diagnosis Same as Pre-procedure Electronic Signature(s) Signed: 02/17/2021 4:23:56 PM By: Donnamarie Poag Entered By: Donnamarie Poag on 02/17/2021 16:03:07 [name] ([id number]) -------------------------------------------------------------------------------- Compression Therapy Details Patient Name: [name], [name] Date of Service: 02/17/2021 3:00 PM Medical Record Number: [id number] Patient Account Number: [id number] Date of Birth/Sex: 1928-05-03 (87 y.o. F) Treating RN: Donnamarie Poag Primary Care Taimur Fier: Viviana Simpler Other Clinician: Referring Shatoria Stooksbury: Viviana Simpler Treating Germaine Ripp/Extender: Skipper Cliche in Treatment: 17 Compression Therapy Performed for Wound Assessment: Wound #1 Right,Lateral Malleolus Performed By: Junius Argyle, RN Compression Type: Three Layer Post Procedure Diagnosis Same as Pre-procedure Electronic Signature(s) Signed: 02/17/2021 4:23:56 PM By: Donnamarie Poag Entered By: Donnamarie Poag on 02/17/2021 16:03:22 [name] ([id number]) -------------------------------------------------------------------------------- Encounter Discharge Information Details Patient Name: ARTASIA, THANG Date of Service: 02/17/2021 3:00 PM Medical Record Number:  [id number] Patient Account Number: [id number] Date of Birth/Sex: May 12, 1928 (87 y.o. F) Treating RN: Donnamarie Poag Primary Care Aniel Hubble: Viviana Simpler Other Clinician: Referring Roderic Lammert: Viviana Simpler Treating Onesimo Lingard/Extender: Skipper Cliche in Treatment: 17 Encounter Discharge Information Items Discharge Condition: Stable Ambulatory Status: Walker Discharge Destination: Home Transportation: Private Auto Accompanied By: daughter Schedule Follow-up Appointment: Yes Clinical Summary of Care: Electronic Signature(s) Signed: 02/17/2021 4:23:56 PM By: Donnamarie Poag Entered By: Donnamarie Poag on 02/17/2021 16:17:06 Slight, Audrie Downs ([id number]) -------------------------------------------------------------------------------- Lower Extremity Assessment Details Patient Name: [name], [name] Date of Service: 02/17/2021 3:00 PM Medical Record Number: [id number] Patient Account Number: [id number] Date of Birth/Sex: [birth year] (87 y.o. F) Treating RN: Donnamarie Poag Primary Care Welborn Keena: Viviana Simpler Other Clinician: Referring Ryle Buscemi: Viviana Simpler Treating Davieon Stockham/Extender: Skipper Cliche in Treatment: 17 Edema Assessment Assessed: Shirlyn Goltz: Yes] Patrice Paradise: Yes] [Left: Edema] [Right: :] Calf Left: Right: Point of Measurement: 33 cm From Medial Instep 22 cm 21.5 cm Ankle Left: Right: Point of Measurement: 9 cm From Medial Instep 17.2 cm 17.2 cm Knee To Floor Left: Right: From Medial Instep 36 cm 36 cm Vascular Assessment Pulses: Dorsalis Pedis Palpable: [Left:Yes] [Right:Yes] Electronic Signature(s) Signed: 02/17/2021 4:23:56 PM By: Donnamarie Poag Entered By: Donnamarie Poag on 02/17/2021 15:29:12 [place], Audrie Downs ([id number]) -------------------------------------------------------------------------------- Multi Wound Chart Details Patient Name: [name] Date of Service: 02/17/2021 3:00 PM Medical Record Number: [id number] Patient Account Number: [id number] Date of Birth/Sex:  1928-06-03 (87 y.o. F) Treating RN: Donnamarie Poag Primary Care Kelseigh Diver: Viviana Simpler Other Clinician: Referring Kaylynn Chamblin: Viviana Simpler Treating Charmaine Placido/Extender: Skipper Cliche in Treatment: 17 Vital Signs Height(in): 80 Pulse(bpm): 37 Weight(lbs): 96 Blood Pressure(mmHg): 164/84 Body Mass Index(BMI): 12 Temperature(F): 98.1 Respiratory Rate(breaths/min): 16 Photos: Wound Location: Right, Lateral Malleolus Left Calcaneus Right Ischial Tuberosity Wounding Event: Gradually Appeared Gradually Appeared Blister Primary Etiology: Pressure Ulcer Pressure Ulcer Pressure Ulcer Comorbid History: Arrhythmia, Coronary Artery Arrhythmia, Coronary Artery Arrhythmia, Coronary Artery Disease, Hypertension, Peripheral Disease, Hypertension, Peripheral Disease, Hypertension, Peripheral Arterial Disease, History of pressure Arterial Disease, History of pressure Arterial Disease, History of pressure wounds, Osteoarthritis wounds, Osteoarthritis wounds, Osteoarthritis Date Acquired: 10/22/2014 07/09/2020 01/16/2021 Weeks of Treatment: _0 Wound Status: Open Open Open Measurements L x W x D (cm) 0.6x0.8x0.1 0.3x0.4x0.1 0.1x0.1x0.1  Area (cm) : 0.377 0.094 0.008 Volume (cm) : 0.038 0.009 0.001 % Reduction in Area: 52.00% 93.90% 99.70% % Reduction in Volume: 51.90% 94.10% 99.70% Classification: Category/Stage III Unstageable/Unclassified Category/Stage III Exudate Amount: Medium Medium Medium Exudate Type: Serous Serous Serosanguineous Exudate Color: amber amber red, brown Wound Margin: Flat and Intact N/A N/A Granulation Amount: Large (67-100%) Large (67-100%) Large (67-100%) Granulation Quality: Red, Pink Red, Pink Pink Necrotic Amount: None Present (0%) Small (1-33%) Small (1-33%) Exposed Structures: Fat Layer (Subcutaneous Tissue): Fat Layer (Subcutaneous Tissue): Fat Layer (Subcutaneous Tissue): Yes Yes Yes Fascia: No Fascia: No Fascia: No Tendon: No Tendon: No Tendon:  No Muscle: No Muscle: No Muscle: No Joint: No Joint: No Joint: No Bone: No Bone: No Bone: No Epithelialization: Small (1-33%) Small (1-33%) Small (1-33%) Treatment Notes Electronic Signature(s) Signed: 02/17/2021 4:23:56 PM By: Donnamarie Poag Entered By: Donnamarie Poag on 02/17/2021 15:58:03 [place], Audrie Downs ([id number]) -------------------------------------------------------------------------------- [place] Details Patient Name: [name], [name] Date of Service: 02/17/2021 3:00 PM Medical Record Number: [id number] Patient Account Number: [id number] Date of Birth/Sex: 1928/06/18 (87 y.o. F) Treating RN: Donnamarie Poag Primary Care Zayde Stroupe: Viviana Simpler Other Clinician: Referring Athanasius Kesling: Viviana Simpler Treating Sya Nestler/Extender: Skipper Cliche in Treatment: 17 Active Inactive Wound/Skin Impairment Nursing Diagnoses: Impaired tissue integrity Goals: Patient/caregiver will verbalize understanding of skin care regimen Date Initiated: 10/21/2020 Date Inactivated: 12/23/2020 Target Resolution Date: 12/21/2020 Goal Status: Met Ulcer/skin breakdown will have a volume reduction of 30% by week 4 Date Initiated: 10/21/2020 Date Inactivated: 11/25/2020 Target Resolution Date: 11/20/2020 Goal Status: Met Ulcer/skin breakdown will have a volume reduction of 50% by week 8 Date Initiated: 10/21/2020 Date Inactivated: 12/23/2020 Target Resolution Date: 12/21/2020 Goal Status: Met Ulcer/skin breakdown will have a volume reduction of 80% by week 12 Date Initiated: 10/21/2020 Date Inactivated: 02/17/2021 Target Resolution Date: 01/20/2021 Goal Status: Met Ulcer/skin breakdown will heal within 14 weeks Date Initiated: 10/21/2020 Target Resolution Date: 02/20/2021 Goal Status: Active Interventions: Assess patient/caregiver ability to obtain necessary supplies Assess patient/caregiver ability to perform ulcer/skin care regimen upon admission and as needed Assess ulceration(s)  every visit Notes: Electronic Signature(s) Signed: 02/17/2021 4:23:56 PM By: Donnamarie Poag Entered By: Donnamarie Poag on 02/17/2021 15:57:55 [place], Audrie Downs ([id number]) -------------------------------------------------------------------------------- Pain Assessment Details Patient Name: [name], [name] Date of Service: 02/17/2021 3:00 PM Medical Record Number: [id number] Patient Account Number: [id number] Date of Birth/Sex: 1927/08/29 (87 y.o. F) Treating RN: Donnamarie Poag Primary Care Emet Rafanan: Viviana Simpler Other Clinician: Referring Liboria Putnam: Viviana Simpler Treating Judithann Villamar/Extender: Skipper Cliche in Treatment: 17 Active Problems Location of Pain Severity and Description of Pain Patient Has Paino No Site Locations Rate the pain. Current Pain Level: 0 Pain Management and Medication Current Pain Management: Electronic Signature(s) Signed: 02/17/2021 4:23:56 PM By: Donnamarie Poag Entered By: Donnamarie Poag on 02/17/2021 15:13:42 [place], Audrie Downs ([id number]) -------------------------------------------------------------------------------- Patient/Caregiver Education Details Patient Name: [name] Date of Service: 02/17/2021 3:00 PM Medical Record Number: [id number] Patient Account Number: [id number] Date of Birth/Gender: 03-03-1928 (87 y.o. F) Treating RN: Donnamarie Poag Primary Care Physician: Viviana Simpler Other Clinician: Referring Physician: Viviana Simpler Treating Physician/Extender: Skipper Cliche in Treatment: 17 Education Assessment Education Provided To: Patient and Caregiver Education Topics Provided Basic Hygiene: Wound Debridement: Wound/Skin Impairment: Electronic Signature(s) Signed: 02/17/2021 4:23:56 PM By: Donnamarie Poag Entered By: Donnamarie Poag on 02/17/2021 15:31:29 L[place]ch, Audrie Downs ([id number]) -------------------------------------------------------------------------------- Wound Assessment Details Patient Name: [name], [name] Date of Service:  02/17/2021 3:00 PM Medical Record Number: [id number] Patient Account Number: [id number] Date of Birth/Sex:  [birth year] (87 y.o. F) Treating RN: Donnamarie Poag Primary Care Maraya Gwilliam: Viviana Simpler Other Clinician: Referring Julyana Woolverton: Viviana Simpler Treating Haidar Muse/Extender: Skipper Cliche in Treatment: 17 Wound Status Wound Number: 1 Primary Pressure Ulcer Etiology: Wound Location: Right, Lateral Malleolus Wound Open Wounding Event: Gradually Appeared Status: Date Acquired: 10/22/2014 Comorbid Arrhythmia, Coronary Artery Disease, Hypertension, Weeks Of Treatment: 17 History: Peripheral Arterial Disease, History of pressure wounds, Clustered Wound: No Osteoarthritis Photos Wound Measurements Length: (cm) 0.6 Width: (cm) 0.8 Depth: (cm) 0.1 Area: (cm) 0.377 Volume: (cm) 0.038 % Reduction in Area: 52% % Reduction in Volume: 51.9% Epithelialization: Small (1-33%) Tunneling: No Undermining: No Wound Description Classification: Category/Stage III Wound Margin: Flat and Intact Exudate Amount: Medium Exudate Type: Serous Exudate Color: amber Foul Odor After Cleansing: No Slough/Fibrino Yes Wound Bed Granulation Amount: Large (67-100%) Exposed Structure Granulation Quality: Red, Pink Fascia Exposed: No Necrotic Amount: None Present (0%) Fat Layer (Subcutaneous Tissue) Exposed: Yes Tendon Exposed: No Muscle Exposed: No Joint Exposed: No Bone Exposed: No Treatment Notes Wound #1 (Malleolus) Wound Laterality: Right, Lateral Cleanser Soap and Water Discharge Instruction: Gently cleanse wound with antibacterial soap, rinse and pat dry prior to dressing wounds OLEVA, KOO ([id number]) Peri-Wound Care Topical Primary Dressing Hydrofera Blue Ready Transfer Foam, 2.5x2.5 (in/in) Discharge Instruction: Apply Hydrofera Blue Ready to wound bed as directed Secondary Dressing ABD Pad 5x9 (in/in) Discharge Instruction: Cover with ABD pad-also pad upper shin with extra ABD  pad Secured With Compression Wrap Profore Lite LF 3 Multilayer Compression Bandaging System Discharge Instruction: Apply 3 multi-layer wrap as prescribed. Compression Stockings Add-Ons Electronic Signature(s) Signed: 02/17/2021 4:23:56 PM By: Donnamarie Poag Entered By: Donnamarie Poag on 02/17/2021 15:25:34 [place], Audrie Downs ([id number]) -------------------------------------------------------------------------------- Wound Assessment Details Patient Name: [name], [name] Date of Service: 02/17/2021 3:00 PM Medical Record Number: [id number] Patient Account Number: [id number] Date of Birth/Sex: 12-May-1928 (87 y.o. F) Treating RN: Donnamarie Poag Primary Care Vondra Aldredge: Viviana Simpler Other Clinician: Referring Bowe Sidor: Viviana Simpler Treating Vivan Vanderveer/Extender: Skipper Cliche in Treatment: 17 Wound Status Wound Number: 2 Primary Pressure Ulcer Etiology: Wound Location: Left Calcaneus Wound Open Wounding Event: Gradually Appeared Status: Date Acquired: 07/09/2020 Comorbid Arrhythmia, Coronary Artery Disease, Hypertension, Weeks Of Treatment: 17 History: Peripheral Arterial Disease, History of pressure wounds, Clustered Wound: No Osteoarthritis Photos Wound Measurements Length: (cm) 0.3 Width: (cm) 0.4 Depth: (cm) 0.1 Area: (cm) 0.094 Volume: (cm) 0.009 % Reduction in Area: 93.9% % Reduction in Volume: 94.1% Epithelialization: Small (1-33%) Tunneling: No Undermining: No Wound Description Classification: Unstageable/Unclassified Exudate Amount: Medium Exudate Type: Serous Exudate Color: amber Foul Odor After Cleansing: No Slough/Fibrino Yes Wound Bed Granulation Amount: Large (67-100%) Exposed Structure Granulation Quality: Red, Pink Fascia Exposed: No Necrotic Amount: Small (1-33%) Fat Layer (Subcutaneous Tissue) Exposed: Yes Necrotic Quality: Adherent Slough Tendon Exposed: No Muscle Exposed: No Joint Exposed: No Bone Exposed: No Treatment Notes Wound #2  (Calcaneus) Wound Laterality: Left Cleanser Soap and Water Discharge Instruction: Gently cleanse wound with antibacterial soap, rinse and pat dry prior to dressing wounds Peri-Wound Care ESSICA, KIKER ([id number]) Topical Primary Dressing Hydrofera Blue Ready Transfer Foam, 2.5x2.5 (in/in) Discharge Instruction: Apply Hydrofera Blue Ready to wound bed as directed Secondary Dressing ABD Pad 5x9 (in/in) Discharge Instruction: Cover with ABD pad Secured With Compression Wrap Profore Lite LF 3 Multilayer Compression Bandaging System Discharge Instruction: Apply 3 multi-layer wrap as prescribed. Compression Stockings Add-Ons Electronic Signature(s) Signed: 02/17/2021 4:23:56 PM By: Donnamarie Poag Entered By: Donnamarie Poag on 02/17/2021 15:26:35 [place], Audrie Downs ([id number]) -------------------------------------------------------------------------------- Wound Assessment Details  Patient Name: [name], [name]. Date of Service: 02/17/2021 3:00 PM Medical Record Number: [id number] Patient Account Number: [id number] Date of Birth/Sex: [birth year] (87 y.o. F) Treating RN: Donnamarie Poag Primary Care Gracelyn Coventry: Viviana Simpler Other Clinician: Referring Shanan Fitzpatrick: Viviana Simpler Treating Nestor Wieneke/Extender: Skipper Cliche in Treatment: 17 Wound Status Wound Number: 4 Primary Pressure Ulcer Etiology: Wound Location: Right Ischial Tuberosity Wound Open Wounding Event: Blister Status: Date Acquired: 01/16/2021 Comorbid Arrhythmia, Coronary Artery Disease, Hypertension, Weeks Of Treatment: 3 History: Peripheral Arterial Disease, History of pressure wounds, Clustered Wound: No Osteoarthritis Photos Wound Measurements Length: (cm) 0.1 Width: (cm) 0.1 Depth: (cm) 0.1 Area: (cm) 0.008 Volume: (cm) 0.001 % Reduction in Area: 99.7% % Reduction in Volume: 99.7% Epithelialization: Small (1-33%) Tunneling: No Undermining: No Wound Description Classification: Category/Stage III Exudate  Amount: Medium Exudate Type: Serosanguineous Exudate Color: red, brown Foul Odor After Cleansing: No Slough/Fibrino Yes Wound Bed Granulation Amount: Large (67-100%) Exposed Structure Granulation Quality: Pink Fascia Exposed: No Necrotic Amount: Small (1-33%) Fat Layer (Subcutaneous Tissue) Exposed: Yes Tendon Exposed: No Muscle Exposed: No Joint Exposed: No Bone Exposed: No Treatment Notes Wound #4 (Ischial Tuberosity) Wound Laterality: Right Cleanser Soap and Water Discharge Instruction: Gently cleanse wound with antibacterial soap, rinse and pat dry prior to dressing wounds Peri-Wound Care KENSY, BLIZARD ([id number]) Topical Primary Dressing Secondary Dressing Mepilex Border Flex, 4x4 (in/in) Discharge Instruction: For protection: Apply to wound as directed. Do not cut. Secured With Compression Wrap Compression Stockings Add-Ons Electronic Signature(s) Signed: 02/17/2021 4:23:56 PM By: Donnamarie Poag Entered By: Donnamarie Poag on 02/17/2021 15:57:41 [place], Audrie Downs ([id number]) -------------------------------------------------------------------------------- [place] Details Patient Name: [name] Date of Service: 02/17/2021 3:00 PM Medical Record Number: [id number] Patient Account Number: [id number] Date of Birth/Sex: 12/04/1927 (87 y.o. F) Treating RN: Donnamarie Poag Primary Care Kandice Schmelter: Viviana Simpler Other Clinician: Referring Khrystal Jeanmarie: Viviana Simpler Treating Minoru Chap/Extender: Skipper Cliche in Treatment: 17 Vital Signs Time Taken: 15:11 Temperature (F): 98.1 Height (in): 66 Pulse (bpm): 75 Weight (lbs): 76 Respiratory Rate (breaths/min): 16 Body Mass Index (BMI): 12.3 Blood Pressure (mmHg): 164/84 Reference Range: 80 - 120 mg / dl Electronic Signature(s) Signed: 02/17/2021 4:23:56 PM By: Donnamarie Poag Entered ByDonnamarie Poag on 02/17/2021 15:13:29

## 2021-02-17 NOTE — Progress Notes (Addendum)
NYELA, SLUYTER ([id number]) Visit Report for 02/17/2021 Chief Complaint Document Details Patient Name: [name], [name]. Date of Service: 02/17/2021 3:00 PM Medical Record Number: [id number] Patient Account Number: [id number] Date of Birth/Sex: 1928/02/02 (87 y.o. F) Treating RN: Donnamarie Poag Primary Care Provider: Viviana Simpler Other Clinician: Referring Provider: Viviana Simpler Treating Provider/Extender: Skipper Cliche in Treatment: 17 Information Obtained from: Patient Chief Complaint Pressure ulcer right ankle and left heel and right ischial/buttock pressure ulcer Electronic Signature(s) Signed: 02/17/2021 3:06:30 PM By: Worthy Keeler PA-C Entered By: Worthy Keeler on 02/17/2021 15:06:30 [name], [name] ([id number]) -------------------------------------------------------------------------------- HPI Details Patient Name: [name], [name] Date of Service: 02/17/2021 3:00 PM Medical Record Number: [id number] Patient Account Number: [id number] Date of Birth/Sex: [birth year] (87 y.o. F) Treating RN: Donnamarie Poag Primary Care Provider: Viviana Simpler Other Clinician: Referring Provider: Viviana Simpler Treating Provider/Extender: Skipper Cliche in Treatment: 17 History of Present Illness HPI Description: 10/21/2020 upon evaluation today patient presents for initial inspection here in our clinic concerning issues that she has been having quite some time in regard to her ankle and since she has been in the hospital with regard to the heel. The ankle in fact has been 5-6 years at least I am told. With that being said the heel ulcer occurred when she was in the hospital in December for hip surgery when she fractured her hip. She also was in the hospital Tuesday for altered mental status. Really there was nothing that was identified as the cause for this. She did have a. Fortunately there does not appear to be any signs of infection she tells me that she has had she believes arterial  studies At [facility] clinic I could not find Those studies at this point. Nonetheless I will continue to look and see what I can find before Then. The patient also has a skin tear on her arm this occurred more recently when she bumped this at home. The patient does have a history of hypertension, coronary artery disease, and peripheral vascular disease stated. 10/28/2020 I was able to find the patient's chart currently which shows that she did have an arterial study performed in May 2019. This showed that she had a abnormal right toe brachial index and a normal left toe brachial index. She was noncompressible as far as ABIs were concerned. She did appear to have triphasic flow at that time. Unfortunately the wound that is commented on in the report that I printed off and read mentions the same wound on the ankle that we are still dealing with at this point. Unfortunately this has not healed. And its been quite sometime about 3 years now. Fortunately there does not appear to be any signs of active infection systemically at this point. I think that the patient has done well with the Santyl over the past week which is good news. Patient's caregiver which is her daughter-in-law is concerned about the fact that she really does not feel qualified to be able to change the dressings and take care of this issue. There does not appear to be any signs of anything untoward going on at this point. I think she is done a great job applying the [employer] and I think that has done a great job for the patient is for soften up some of the necrotic tissue. With that being said I think the Xeroform on the arm also has done excellent. In general I am very pleased with where we stand. And I told the patient's  daughter-in- law as well that I also feel like she has done a great job over the past week taking care of her mother. 11/11/2020 upon evaluation today patient appears to be doing well with regard to her wounds. She has been  tolerating the dressing changes without complication her daughters been applying the Santyl which has done a great job. Ho with that being said I think now that we have good arterial study showing we can go ahead and proceed with sharp debridement at this point. 11/25/2020 upon evaluation today patient appears to be doing well with regard to her wounds. The Santyl has really helped to clean things up and overall she is doing quite excellent at this point. Fortunately there does not appear to be any signs of infection which is great news and in general extremely pleased. I do believe some debridement is in order and hopefully will be able to get her into a collagen dressing after this. 12/23/2020 upon evaluation today patient appears to be doing well with regard to her wound all things considered. Unfortunately it does sound like her daughter decided to let this air out because she felt like it was getting so wet. It sounds like she got border gauze dressings instead of border foam therefore there is really Nothing to catch the excess drainage which I think is the problem they were worried about the smell. Fortunately there does not appear to be any signs of active infection which is great news. Nonetheless I do not think we want to leave this just open to air. 01/13/2021 upon evaluation today patient's wounds actually appear to be about as good as have seen since have been taking care of her. Fortunately there does not appear to be any evidence of infection which is great and overall the biggest issue I see is that of fluid buildup. I think we need to do something to try to help manage this. I think if we can control her edema we get the wounds to heal more effectively. 01/27/2021 upon evaluation today patient appears to be doing well in regard to the wounds on her right lateral malleolus as well as the left heel and the right ischial tuberosity is unfortunately a new area that has arisen since we last saw  her. She tells me currently that that is from sitting too much. With that being said this actually appears to be doing the worst of anything so far that I see today. Fortunately there does not appear to be any signs of infection this is at least good news. Compression wrap seem to be doing excellent for her as far as the legs are concerned. 02/03/2021 upon evaluation today patient appears to be doing well with regard to all of her wounds. She has been tolerating the dressing changes without complication. Fortunately there is no signs of active infection at this time. No fevers, chills, nausea, vomiting, or diarrhea. 02/10/2021 upon evaluation today patient appears to be doing well with regard to her wounds. With that being said there is some slight evidence of hypergranulation in regard to the right ankle in particular and a little bit in regard to the left heel. I think Hydrofera Blue might be a better option to go to at this point based on what I am seeing especially since both of these wounds in particular seem to be a little bit stalled. I discussed that with the patient and her daughter today. With regard to the hip this is doing great with the  collagen I think will get very close to complete closure I recommend we continue with the collagen. 02/17/2021 upon evaluation today patient appears to be doing excellent in regard to her heel ulcers. She has been tolerating the dressing changes without complication and overall I am extremely pleased with where things stand today. There does not appear to be any signs of active infection which is great news. Overall I may think that we are headed in the proper direction based on what I am seeing. Electronic Signature(s) Signed: 02/17/2021 5:34:29 PM By: Worthy Keeler PA-C Entered By: Worthy Keeler on 02/17/2021 17:34:29 [name], [name] ([id number]) [name], [name]  ([id number]) -------------------------------------------------------------------------------- Physical Exam Details Patient Name: ANMOL, DECOOK Date of Service: 02/17/2021 3:00 PM Medical Record Number: [id number] Patient Account Number: [id number] Date of Birth/Sex: [birth year] (87 y.o. F) Treating RN: Donnamarie Poag Primary Care Provider: Viviana Simpler Other Clinician: Referring Provider: Viviana Simpler Treating Provider/Extender: Skipper Cliche in Treatment: 84 Constitutional Well-nourished and well-hydrated in no acute distress. Respiratory normal breathing without difficulty. Psychiatric this patient is able to make decisions and demonstrates good insight into disease process. Alert and Oriented x 3. pleasant and cooperative. Notes Upon inspection patient's wound bed showed signs of great healing in regard to the hip I think this is very close to complete closure and in fact. In regard to the heel and ankle region the [facility] seems to have done an excellent job and I am extremely pleased with what we see here today as well there is literally is no need for sharp debridement today. Electronic Signature(s) Signed: 02/17/2021 5:34:50 PM By: Worthy Keeler PA-C Entered By: Worthy Keeler on 02/17/2021 17:34:50 [name], [name] ([id number]) -------------------------------------------------------------------------------- Physician Orders Details Patient Name: [name], [name] Date of Service: 02/17/2021 3:00 PM Medical Record Number: [id number] Patient Account Number: [id number] Date of Birth/Sex: 1927-08-02 (87 y.o. F) Treating RN: Donnamarie Poag Primary Care Provider: Viviana Simpler Other Clinician: Referring Provider: Viviana Simpler Treating Provider/Extender: Skipper Cliche in Treatment: 17 Verbal / Phone Orders: No Diagnosis Coding ICD-10 Coding Code Description L89.513 Pressure ulcer of right ankle, stage 3 L89.623 Pressure ulcer of left heel, stage 3 S51.802A  Unspecified open wound of left forearm, initial encounter L89.312 Pressure ulcer of right buttock, stage 2 I10 Essential (primary) hypertension I25.10 Atherosclerotic heart disease of native coronary artery without angina pectoris I73.89 Other specified peripheral vascular diseases Follow-up Appointments o Return Appointment in 1 week. o Nurse Visit as needed Bathing/ Shower/ Hygiene o May shower with wound dressing protected with water repellent cover or cast protector. o No tub bath. Edema Control - Lymphedema / Segmental Compressive Device / Other Bilateral Lower Extremities o Optional: One layer of unna paste to top of compression wrap (to act as an anchor). o Elevate legs to the level of the heart and pump ankles as often as possible o Elevate leg(s) parallel to the floor when sitting. o DO YOUR BEST to sleep in the bed at night. DO NOT sleep in your recliner. Long hours of sitting in a recliner leads to swelling of the legs and/or potential wounds on your backside. Off-Loading o Gel wheelchair cushion - prop when sitting or laying to avoid pressure on your hip wound o Turn and reposition every 2 hours Wound Treatment Wound #1 - Malleolus Wound Laterality: Right, Lateral Cleanser: Soap and Water 1 x Per Day/30 Days Discharge Instructions: Gently cleanse wound with antibacterial soap, rinse and pat dry prior to dressing wounds Primary  Dressing: Hydrofera Blue Ready Transfer Foam, 2.5x2.5 (in/in) 1 x Per Day/30 Days Discharge Instructions: Apply Hydrofera Blue Ready to wound bed as directed Secondary Dressing: ABD Pad 5x9 (in/in) 1 x Per Day/30 Days Discharge Instructions: Cover with ABD pad-also pad upper shin with extra ABD pad Compression Wrap: Profore Lite LF 3 Multilayer Compression Bandaging System 1 x Per Day/30 Days Discharge Instructions: Apply 3 multi-layer wrap as prescribed. Wound #2 - Calcaneus Wound Laterality: Left Cleanser: Soap and Water 1 x Per  Day/30 Days Discharge Instructions: Gently cleanse wound with antibacterial soap, rinse and pat dry prior to dressing wounds Primary Dressing: Hydrofera Blue Ready Transfer Foam, 2.5x2.5 (in/in) 1 x Per Day/30 Days Discharge Instructions: Apply Hydrofera Blue Ready to wound bed as directed Secondary Dressing: ABD Pad 5x9 (in/in) 1 x Per Day/30 Days Discharge Instructions: Cover with ABD pad [name], [name] ([id number]) Compression Wrap: Profore Lite LF 3 Multilayer Compression Bandaging System 1 x Per Day/30 Days Discharge Instructions: Apply 3 multi-layer wrap as prescribed. Wound #4 - Ischial Tuberosity Wound Laterality: Right Cleanser: Soap and Water 3 x Per Week/30 Days Discharge Instructions: Gently cleanse wound with antibacterial soap, rinse and pat dry prior to dressing wounds Secondary Dressing: Mepilex Border Flex, 4x4 (in/in) (Generic) 3 x Per Week/30 Days Discharge Instructions: For protection: Apply to wound as directed. Do not cut. Electronic Signature(s) Signed: 02/17/2021 4:23:56 PM By: Donnamarie Poag Signed: 02/17/2021 5:54:54 PM By: Worthy Keeler PA-C Entered By: Donnamarie Poag on 02/17/2021 16:00:26 [name], [name] ([id number]) -------------------------------------------------------------------------------- Problem List Details Patient Name: [name], [name] Date of Service: 02/17/2021 3:00 PM Medical Record Number: [id number] Patient Account Number: [id number] Date of Birth/Sex: [birth year] (87 y.o. F) Treating RN: Donnamarie Poag Primary Care Provider: Viviana Simpler Other Clinician: Referring Provider: Viviana Simpler Treating Provider/Extender: Skipper Cliche in Treatment: 17 Active Problems ICD-10 Encounter Code Description Active Date MDM Diagnosis L89.513 Pressure ulcer of right ankle, stage 3 10/21/2020 No Yes L89.623 Pressure ulcer of left heel, stage 3 10/21/2020 No Yes S51.802A Unspecified open wound of left forearm, initial encounter 10/21/2020 No Yes L89.312  Pressure ulcer of right buttock, stage 2 01/27/2021 No Yes I10 Essential (primary) hypertension 10/21/2020 No Yes I25.10 Atherosclerotic heart disease of native coronary artery without angina 10/21/2020 No Yes pectoris I73.89 Other specified peripheral vascular diseases 10/21/2020 No Yes Inactive Problems Resolved Problems Electronic Signature(s) Signed: 02/17/2021 3:04:30 PM By: Worthy Keeler PA-C Entered By: Worthy Keeler on 02/17/2021 15:04:30 Frye, [name] ([id number]) -------------------------------------------------------------------------------- Progress Note Details Patient Name: [name] Date of Service: 02/17/2021 3:00 PM Medical Record Number: [id number] Patient Account Number: [id number] Date of Birth/Sex: 04/10/1928 (87 y.o. F) Treating RN: Donnamarie Poag Primary Care Provider: Viviana Simpler Other Clinician: Referring Provider: Viviana Simpler Treating Provider/Extender: Skipper Cliche in Treatment: 17 Subjective Chief Complaint Information obtained from Patient Pressure ulcer right ankle and left heel and right ischial/buttock pressure ulcer History of Present Illness (HPI) 10/21/2020 upon evaluation today patient presents for initial inspection here in our clinic concerning issues that she has been having quite some time in regard to her ankle and since she has been in the hospital with regard to the heel. The ankle in fact has been 5-6 years at least I am told. With that being said the heel ulcer occurred when she was in the hospital in December for hip surgery when she fractured her hip. She also was in the hospital Tuesday for altered mental status. Really there was nothing that was identified as  the cause for this. She did have a. Fortunately there does not appear to be any signs of infection she tells me that she has had she believes arterial studies At [facility] clinic I could not find Those studies at this point. Nonetheless I will continue to look and see  what I can find before Then. The patient also has a skin tear on her arm this occurred more recently when she bumped this at home. The patient does have a history of hypertension, coronary artery disease, and peripheral vascular disease stated. 10/28/2020 I was able to find the patient's chart currently which shows that she did have an arterial study performed in May 2019. This showed that she had a abnormal right toe brachial index and a normal left toe brachial index. She was noncompressible as far as ABIs were concerned. She did appear to have triphasic flow at that time. Unfortunately the wound that is commented on in the report that I printed off and read mentions the same wound on the ankle that we are still dealing with at this point. Unfortunately this has not healed. And its been quite sometime about 3 years now. Fortunately there does not appear to be any signs of active infection systemically at this point. I think that the patient has done well with the Santyl over the past week which is good news. Patient's caregiver which is her daughter-in-law is concerned about the fact that she really does not feel qualified to be able to change the dressings and take care of this issue. There does not appear to be any signs of anything untoward going on at this point. I think she is done a great job applying the [employer] and I think that has done a great job for the patient is for soften up some of the necrotic tissue. With that being said I think the Xeroform on the arm also has done excellent. In general I am very pleased with where we stand. And I told the patient's daughter-in- law as well that I also feel like she has done a great job over the past week taking care of her mother. 11/11/2020 upon evaluation today patient appears to be doing well with regard to her wounds. She has been tolerating the dressing changes without complication her daughters been applying the Santyl which has done a great  job. Ho with that being said I think now that we have good arterial study showing we can go ahead and proceed with sharp debridement at this point. 11/25/2020 upon evaluation today patient appears to be doing well with regard to her wounds. The Santyl has really helped to clean things up and overall she is doing quite excellent at this point. Fortunately there does not appear to be any signs of infection which is great news and in general extremely pleased. I do believe some debridement is in order and hopefully will be able to get her into a collagen dressing after this. 12/23/2020 upon evaluation today patient appears to be doing well with regard to her wound all things considered. Unfortunately it does sound like her daughter decided to let this air out because she felt like it was getting so wet. It sounds like she got border gauze dressings instead of border foam therefore there is really Nothing to catch the excess drainage which I think is the problem they were worried about the smell. Fortunately there does not appear to be any signs of active infection which is great news. Nonetheless I do  not think we want to leave this just open to air. 01/13/2021 upon evaluation today patient's wounds actually appear to be about as good as have seen since have been taking care of her. Fortunately there does not appear to be any evidence of infection which is great and overall the biggest issue I see is that of fluid buildup. I think we need to do something to try to help manage this. I think if we can control her edema we get the wounds to heal more effectively. 01/27/2021 upon evaluation today patient appears to be doing well in regard to the wounds on her right lateral malleolus as well as the left heel and the right ischial tuberosity is unfortunately a new area that has arisen since we last saw her. She tells me currently that that is from sitting too much. With that being said this actually appears to be  doing the worst of anything so far that I see today. Fortunately there does not appear to be any signs of infection this is at least good news. Compression wrap seem to be doing excellent for her as far as the legs are concerned. 02/03/2021 upon evaluation today patient appears to be doing well with regard to all of her wounds. She has been tolerating the dressing changes without complication. Fortunately there is no signs of active infection at this time. No fevers, chills, nausea, vomiting, or diarrhea. 02/10/2021 upon evaluation today patient appears to be doing well with regard to her wounds. With that being said there is some slight evidence of hypergranulation in regard to the right ankle in particular and a little bit in regard to the left heel. I think Hydrofera Blue might be a better option to go to at this point based on what I am seeing especially since both of these wounds in particular seem to be a little bit stalled. I discussed that with the patient and her daughter today. With regard to the hip this is doing great with the collagen I think will get very close to complete closure I recommend we continue with the collagen. 02/17/2021 upon evaluation today patient appears to be doing excellent in regard to her heel ulcers. She has been tolerating the dressing changes without complication and overall I am extremely pleased with where things stand today. There does not appear to be any signs of active infection which is great news. Overall I may think that we are headed in the proper direction based on what I am seeing. [name], [name] ([id number]) Objective Constitutional Well-nourished and well-hydrated in no acute distress. Vitals Time Taken: 3:11 PM, Height: 66 in, Weight: 76 lbs, BMI: 12.3, Temperature: 98.1 F, Pulse: 75 bpm, Respiratory Rate: 16 breaths/min, Blood Pressure: 164/84 mmHg. Respiratory normal breathing without difficulty. Psychiatric this patient is able to make  decisions and demonstrates good insight into disease process. Alert and Oriented x 3. pleasant and cooperative. General Notes: Upon inspection patient's wound bed showed signs of great healing in regard to the hip I think this is very close to complete closure and in fact. In regard to the heel and ankle region the [facility] seems to have done an excellent job and I am extremely pleased with what we see here today as well there is literally is no need for sharp debridement today. Integumentary (Hair, Skin) Wound #1 status is Open. Original cause of wound was Gradually Appeared. The date acquired was: 10/22/2014. The wound has been in treatment 17 weeks. The wound is located  on the Right,Lateral Malleolus. The wound measures 0.6cm length x 0.8cm width x 0.1cm depth; 0.377cm^2 area and 0.038cm^3 volume. There is Fat Layer (Subcutaneous Tissue) exposed. There is no tunneling or undermining noted. There is a medium amount of serous drainage noted. The wound margin is flat and intact. There is large (67-100%) red, pink granulation within the wound bed. There is no necrotic tissue within the wound bed. Wound #2 status is Open. Original cause of wound was Gradually Appeared. The date acquired was: 07/09/2020. The wound has been in treatment 17 weeks. The wound is located on the Left Calcaneus. The wound measures 0.3cm length x 0.4cm width x 0.1cm depth; 0.094cm^2 area and 0.009cm^3 volume. There is Fat Layer (Subcutaneous Tissue) exposed. There is no tunneling or undermining noted. There is a medium amount of serous drainage noted. There is large (67-100%) red, pink granulation within the wound bed. There is a small (1-33%) amount of necrotic tissue within the wound bed including Adherent Slough. Wound #4 status is Open. Original cause of wound was Blister. The date acquired was: 01/16/2021. The wound has been in treatment 3 weeks. The wound is located on the Right Ischial Tuberosity. The wound measures  0.1cm length x 0.1cm width x 0.1cm depth; 0.008cm^2 area and 0.001cm^3 volume. There is Fat Layer (Subcutaneous Tissue) exposed. There is no tunneling or undermining noted. There is a medium amount of serosanguineous drainage noted. There is large (67-100%) pink granulation within the wound bed. There is a small (1-33%) amount of necrotic tissue within the wound bed. Assessment Active Problems ICD-10 Pressure ulcer of right ankle, stage 3 Pressure ulcer of left heel, stage 3 Unspecified open wound of left forearm, initial encounter Pressure ulcer of right buttock, stage 2 Essential (primary) hypertension Atherosclerotic heart disease of native coronary artery without angina pectoris Other specified peripheral vascular diseases Procedures Wound #1 Pre-procedure diagnosis of Wound #1 is a Pressure Ulcer located on the Right,Lateral Malleolus . There was a Three Layer Compression Therapy Procedure by Donnamarie Poag, RN. Post procedure Diagnosis Wound #1: Same as Pre-Procedure [name], [name] ([id number]) Wound #2 Pre-procedure diagnosis of Wound #2 is a Pressure Ulcer located on the Left Calcaneus . There was a Three Layer Compression Therapy Procedure by Donnamarie Poag, RN. Post procedure Diagnosis Wound #2: Same as Pre-Procedure Plan Follow-up Appointments: Return Appointment in 1 week. Nurse Visit as needed Bathing/ Shower/ Hygiene: May shower with wound dressing protected with water repellent cover or cast protector. No tub bath. Edema Control - Lymphedema / Segmental Compressive Device / Other: Optional: One layer of unna paste to top of compression wrap (to act as an anchor). Elevate legs to the level of the heart and pump ankles as often as possible Elevate leg(s) parallel to the floor when sitting. DO YOUR BEST to sleep in the bed at night. DO NOT sleep in your recliner. Long hours of sitting in a recliner leads to swelling of the legs and/or potential wounds on your  backside. Off-Loading: Gel wheelchair cushion - prop when sitting or laying to avoid pressure on your hip wound Turn and reposition every 2 hours WOUND #1: - Malleolus Wound Laterality: Right, Lateral Cleanser: Soap and Water 1 x Per Day/30 Days Discharge Instructions: Gently cleanse wound with antibacterial soap, rinse and pat dry prior to dressing wounds Primary Dressing: Hydrofera Blue Ready Transfer Foam, 2.5x2.5 (in/in) 1 x Per Day/30 Days Discharge Instructions: Apply Hydrofera Blue Ready to wound bed as directed Secondary Dressing: ABD Pad 5x9 (in/in) 1 x  Per Day/30 Days Discharge Instructions: Cover with ABD pad-also pad upper shin with extra ABD pad Compression Wrap: Profore Lite LF 3 Multilayer Compression Bandaging System 1 x Per Day/30 Days Discharge Instructions: Apply 3 multi-layer wrap as prescribed. WOUND #2: - Calcaneus Wound Laterality: Left Cleanser: Soap and Water 1 x Per Day/30 Days Discharge Instructions: Gently cleanse wound with antibacterial soap, rinse and pat dry prior to dressing wounds Primary Dressing: Hydrofera Blue Ready Transfer Foam, 2.5x2.5 (in/in) 1 x Per Day/30 Days Discharge Instructions: Apply Hydrofera Blue Ready to wound bed as directed Secondary Dressing: ABD Pad 5x9 (in/in) 1 x Per Day/30 Days Discharge Instructions: Cover with ABD pad Compression Wrap: Profore Lite LF 3 Multilayer Compression Bandaging System 1 x Per Day/30 Days Discharge Instructions: Apply 3 multi-layer wrap as prescribed. WOUND #4: - Ischial Tuberosity Wound Laterality: Right Cleanser: Soap and Water 3 x Per Week/30 Days Discharge Instructions: Gently cleanse wound with antibacterial soap, rinse and pat dry prior to dressing wounds Secondary Dressing: Mepilex Border Flex, 4x4 (in/in) (Generic) 3 x Per Week/30 Days Discharge Instructions: For protection: Apply to wound as directed. Do not cut. 1. Would recommend currently that we going continue with the wound care measures  as before and the patient is in agreement the plan this includes the use of the Hydrofera Blue to the ankle and heel location. 2. I am also can recommend just a border foam dressing to cover over the hip location. I think this is really doing quite well and I am extremely happy in that regard. In fact it probably will be completely closed by next time I see her. 3. I am also can recommend that we continue with the compression wrapping that is helping with edema control coupled with the [facility] I think this is doing also again we have been using a 3 layer compression wrap. We will see patient back for reevaluation in 1 week here in the clinic. If anything worsens or changes patient will contact our office for additional recommendations. Electronic Signature(s) Signed: 02/17/2021 5:36:00 PM By: Worthy Keeler PA-C Entered By: Worthy Keeler on 02/17/2021 17:36:00 [place], [name] ([id number]) -------------------------------------------------------------------------------- SuperBill Details Patient Name: SHANESSA, LIEBLING Date of Service: 02/17/2021 Medical Record Number: [id number] Patient Account Number: [id number] Date of Birth/Sex: December 28, 1927 (87 y.o. F) Treating RN: Donnamarie Poag Primary Care Provider: Viviana Simpler Other Clinician: Referring Provider: Viviana Simpler Treating Provider/Extender: Skipper Cliche in Treatment: 17 Diagnosis Coding ICD-10 Codes Code Description L89.513 Pressure ulcer of right ankle, stage 3 L89.623 Pressure ulcer of left heel, stage 3 S51.802A Unspecified open wound of left forearm, initial encounter L89.312 Pressure ulcer of right buttock, stage 2 I10 Essential (primary) hypertension I25.10 Atherosclerotic heart disease of native coronary artery without angina pectoris I73.89 Other specified peripheral vascular diseases Facility Procedures CPT4: Description Modifier Quantity Code [id number] BILATERAL: Application of multi-layer venous  compression system; leg (below knee), including 1 ankle and foot. Physician Procedures CPT4 Code: [id number] Description: [id number] - WC PHYS LEVEL 4 - EST PT Modifier: Quantity: 1 CPT4 Code: Description: ICD-10 Diagnosis Description L89.513 Pressure ulcer of right ankle, stage 3 L89.623 Pressure ulcer of left heel, stage 3 S51.802A Unspecified open wound of left forearm, initial encounter L89.312 Pressure ulcer of right buttock, stage 2 Modifier: Quantity: Electronic Signature(s) Signed: 02/17/2021 5:38:25 PM By: Worthy Keeler PA-C Previous Signature: 02/17/2021 4:23:56 PM Version By: Donnamarie Poag Entered By: Worthy Keeler on 02/17/2021 17:38:24

## 2021-02-24 ENCOUNTER — Other Ambulatory Visit: Payer: Self-pay

## 2021-02-24 ENCOUNTER — Encounter: Payer: PPO | Admitting: Physician Assistant

## 2021-02-24 DIAGNOSIS — L89312 Pressure ulcer of right buttock, stage 2: Secondary | ICD-10-CM | POA: Diagnosis not present

## 2021-02-24 DIAGNOSIS — L89513 Pressure ulcer of right ankle, stage 3: Secondary | ICD-10-CM | POA: Diagnosis not present

## 2021-02-24 DIAGNOSIS — L89623 Pressure ulcer of left heel, stage 3: Secondary | ICD-10-CM | POA: Diagnosis not present

## 2021-02-24 NOTE — Progress Notes (Signed)
[name], [name] ([id number]) Visit Report for 02/24/2021 Arrival Information Details Patient Name: [name], [name] Date of Service: 02/24/2021 3:00 PM Medical Record Number: [id number] Patient Account Number: [id number] Date of Birth/Sex: 08-13-1927 (87 y.o. F) Treating RN: Donnamarie Poag Primary Care Elzada Pytel: Viviana Simpler Other Clinician: Referring Clebert Wenger: Viviana Simpler Treating Katesha Eichel/Extender: Skipper Cliche in Treatment: 18 Visit Information History Since Last Visit Added or deleted any medications: No Patient Arrived: Ambulatory Had a fall or experienced change in No Arrival Time: 15:03 activities of daily living that may affect Accompanied By: daughter in law risk of falls: Transfer Assistance: None Hospitalized since last visit: No Patient Identification Verified: Yes Has Dressing in Place as Prescribed: Yes Secondary Verification Process Completed: Yes Has Compression in Place as Prescribed: Yes Patient Has Alerts: Yes Pain Present Now: No Patient Alerts: NOT DIABETIC TBI left .77 TBI right .76 Electronic Signature(s) Signed: 02/24/2021 3:35:03 PM By: Donnamarie Poag Entered By: Donnamarie Poag on 02/24/2021 15:03:38 [name] ([id number]) -------------------------------------------------------------------------------- Clinic Level of Care Assessment Details Patient Name: [name], [name] Date of Service: 02/24/2021 3:00 PM Medical Record Number: [id number] Patient Account Number: [id number] Date of Birth/Sex: [birth year] (87 y.o. F) Treating RN: Donnamarie Poag Primary Care Donaldson Richter: Viviana Simpler Other Clinician: Referring Desha Bitner: Viviana Simpler Treating Mickaela Starlin/Extender: Skipper Cliche in Treatment: 18 Clinic Level of Care Assessment Items TOOL 1 Quantity Score '[]'  - Use when EandM and Procedure is performed on INITIAL visit 0 ASSESSMENTS - Nursing Assessment / Reassessment '[]'  - General Physical Exam (combine w/ comprehensive assessment (listed just  below) when performed on new 0 pt. evals) '[]'  - 0 Comprehensive Assessment (HX, ROS, Risk Assessments, Wounds Hx, etc.) ASSESSMENTS - Wound and Skin Assessment / Reassessment '[]'  - Dermatologic / Skin Assessment (not related to wound area) 0 ASSESSMENTS - Ostomy and/or Continence Assessment and Care '[]'  - Incontinence Assessment and Management 0 '[]'  - 0 Ostomy Care Assessment and Management (repouching, etc.) PROCESS - Coordination of Care '[]'  - Simple Patient / Family Education for ongoing care 0 '[]'  - 0 Complex (extensive) Patient / Family Education for ongoing care '[]'  - 0 Staff obtains Programmer, systems, Records, Test Results / Process Orders '[]'  - 0 Staff telephones HHA, Nursing Homes / Clarify orders / etc '[]'  - 0 Routine Transfer to another Facility (non-emergent condition) '[]'  - 0 Routine Hospital Admission (non-emergent condition) '[]'  - 0 New Admissions / Biomedical engineer / Ordering NPWT, Apligraf, etc. '[]'  - 0 Emergency Hospital Admission (emergent condition) PROCESS - Special Needs '[]'  - Pediatric / Minor Patient Management 0 '[]'  - 0 Isolation Patient Management '[]'  - 0 Hearing / Language / Visual special needs '[]'  - 0 Assessment of Community assistance (transportation, D/C planning, etc.) '[]'  - 0 Additional assistance / Altered mentation '[]'  - 0 Support Surface(s) Assessment (bed, cushion, seat, etc.) INTERVENTIONS - Miscellaneous '[]'  - External ear exam 0 '[]'  - 0 Patient Transfer (multiple staff / Civil Service fast streamer / Similar devices) '[]'  - 0 Simple Staple / Suture removal (25 or less) '[]'  - 0 Complex Staple / Suture removal (26 or more) '[]'  - 0 Hypo/Hyperglycemic Management (do not check if billed separately) '[]'  - 0 Ankle / Brachial Index (ABI) - do not check if billed separately Has the patient been seen at the hospital within the last three years: Yes Total Score: 0 Level Of Care: ____ [name] ([id number]) Electronic Signature(s) Signed: 02/24/2021 4:31:30 PM By:  Donnamarie Poag Entered By: Donnamarie Poag on 02/24/2021 15:51:18 Paone, [name] ([id number]) -------------------------------------------------------------------------------- Compression Therapy  Details Patient Name: [name], [name]. Date of Service: 02/24/2021 3:00 PM Medical Record Number: [id number] Patient Account Number: [id number] Date of Birth/Sex: [birth year] (87 y.o. F) Treating RN: Donnamarie Poag Primary Care Tiffancy Moger: Viviana Simpler Other Clinician: Referring Dejon Lukas: Viviana Simpler Treating Ismerai Bin/Extender: Skipper Cliche in Treatment: 18 Compression Therapy Performed for Wound Assessment: Wound #2 Left Calcaneus Performed By: Clinician Donnamarie Poag, RN Compression Type: Three Layer Post Procedure Diagnosis Same as Pre-procedure Electronic Signature(s) Signed: 02/24/2021 4:31:30 PM By: Donnamarie Poag Entered By: Donnamarie Poag on 02/24/2021 15:45:41 Carrasco, [name] ([id number]) -------------------------------------------------------------------------------- Compression Therapy Details Patient Name: [name], [name] Date of Service: 02/24/2021 3:00 PM Medical Record Number: [id number] Patient Account Number: [id number] Date of Birth/Sex: 03/17/1928 (87 y.o. F) Treating RN: Donnamarie Poag Primary Care Teola Felipe: Viviana Simpler Other Clinician: Referring Ilias Stcharles: Viviana Simpler Treating Jakalyn Kratky/Extender: Skipper Cliche in Treatment: 18 Compression Therapy Performed for Wound Assessment: Wound #1 Right,Lateral Malleolus Performed By: Junius Argyle, RN Compression Type: Three Layer Post Procedure Diagnosis Same as Pre-procedure Electronic Signature(s) Signed: 02/24/2021 4:31:30 PM By: Donnamarie Poag Entered By: Donnamarie Poag on 02/24/2021 15:45:54 Newhouse, [name] ([id number]) -------------------------------------------------------------------------------- Encounter Discharge Information Details Patient Name: [name], [name] Date of Service: 02/24/2021 3:00 PM Medical Record  Number: [id number] Patient Account Number: [id number] Date of Birth/Sex: 1928/02/20 (87 y.o. F) Treating RN: Donnamarie Poag Primary Care Jarl Sellitto: Viviana Simpler Other Clinician: Referring Richad Ramsay: Viviana Simpler Treating Allan Bacigalupi/Extender: Skipper Cliche in Treatment: 18 Encounter Discharge Information Items Discharge Condition: Stable Ambulatory Status: Walker Discharge Destination: Home Transportation: Private Auto Accompanied By: self Schedule Follow-up Appointment: Yes Clinical Summary of Care: Electronic Signature(s) Signed: 02/24/2021 4:31:30 PM By: Donnamarie Poag Entered By: Donnamarie Poag on 02/24/2021 15:52:27 Rutigliano, [name] ([id number]) -------------------------------------------------------------------------------- Lower Extremity Assessment Details Patient Name: [name], [name] Date of Service: 02/24/2021 3:00 PM Medical Record Number: [id number] Patient Account Number: [id number] Date of Birth/Sex: [birth year] (87 y.o. F) Treating RN: Donnamarie Poag Primary Care Chirstina Haan: Viviana Simpler Other Clinician: Referring Bolton Canupp: Viviana Simpler Treating Inessa Wardrop/Extender: Skipper Cliche in Treatment: 18 Edema Assessment Assessed: Shirlyn Goltz: Yes] Patrice Paradise: Yes] Edema: [Left: No] [Right: No] Calf Left: Right: Point of Measurement: 33 cm From Medial Instep 22 cm 21.5 cm Ankle Left: Right: Point of Measurement: 9 cm From Medial Instep 17 cm 17 cm Vascular Assessment Pulses: Dorsalis Pedis Palpable: [Left:Yes] [Right:Yes] Electronic Signature(s) Signed: 02/24/2021 3:35:03 PM By: Donnamarie Poag Entered By: Donnamarie Poag on 02/24/2021 15:24:44 Stafford, [name] ([id number]) -------------------------------------------------------------------------------- Multi Wound Chart Details Patient Name: [name] Date of Service: 02/24/2021 3:00 PM Medical Record Number: [id number] Patient Account Number: [id number] Date of Birth/Sex: [birth year] (87 y.o. F) Treating RN: Donnamarie Poag Primary  Care [place] Lesure: Viviana Simpler Other Clinician: Referring Becker Christopher: Viviana Simpler Treating Caeleigh Prohaska/Extender: Skipper Cliche in Treatment: 18 Vital Signs Height(in): 33 Pulse(bpm): 47 Weight(lbs): 30 Blood Pressure(mmHg): 165/75 Body Mass Index(BMI): 12 Temperature(F): 98.1 Respiratory Rate(breaths/min): 16 Photos: Wound Location: Right, Lateral Malleolus Left Calcaneus Right Ischial Tuberosity Wounding Event: Gradually Appeared Gradually Appeared Blister Primary Etiology: Pressure Ulcer Pressure Ulcer Pressure Ulcer Comorbid History: Arrhythmia, Coronary Artery Arrhythmia, Coronary Artery Arrhythmia, Coronary Artery Disease, Hypertension, Peripheral Disease, Hypertension, Peripheral Disease, Hypertension, Peripheral Arterial Disease, History of pressure Arterial Disease, History of pressure Arterial Disease, History of pressure wounds, Osteoarthritis wounds, Osteoarthritis wounds, Osteoarthritis Date Acquired: 10/22/2014 07/09/2020 01/16/2021 Weeks of Treatment: '18 18 4 ' Wound Status: Open Open Open Measurements L x W x D (cm) 0.5x0.4x0.1 0.3x0.3x0.1 0.3x0.3x0.1 Area (cm) : 0.157 0.071 0.071 Volume (cm) : 0.016 0.007  0.007 % Reduction in Area: 80.00% 95.40% 97.60% % Reduction in Volume: 79.70% 95.40% 97.60% Classification: Category/Stage III Unstageable/Unclassified Category/Stage III Exudate Amount: Medium Medium Medium Exudate Type: Serous Serous Serosanguineous Exudate Color: amber amber red, brown Wound Margin: Flat and Intact N/A N/A Granulation Amount: Large (67-100%) Large (67-100%) Large (67-100%) Granulation Quality: Red, Pink Red, Pink Pink Necrotic Amount: None Present (0%) Small (1-33%) Small (1-33%) Exposed Structures: Fat Layer (Subcutaneous Tissue): Fat Layer (Subcutaneous Tissue): Fat Layer (Subcutaneous Tissue): Yes Yes Yes Fascia: No Fascia: No Fascia: No Tendon: No Tendon: No Tendon: No Muscle: No Muscle: No Muscle: No Joint: No Joint:  No Joint: No Bone: No Bone: No Bone: No Epithelialization: Small (1-33%) Small (1-33%) Small (1-33%) Treatment Notes Electronic Signature(s) Signed: 02/24/2021 3:35:03 PM By: Donnamarie Poag Entered By: Donnamarie Poag on 02/24/2021 15:25:27 [name] ([id number]) -------------------------------------------------------------------------------- Multi-Disciplinary Care Plan Details Patient Name: [name], [name] Date of Service: 02/24/2021 3:00 PM Medical Record Number: [id number] Patient Account Number: [id number] Date of Birth/Sex: 1927-12-04 (87 y.o. F) Treating RN: Donnamarie Poag Primary Care Asma Boldon: Viviana Simpler Other Clinician: Referring Jhanvi Drakeford: Viviana Simpler Treating Loucille Takach/Extender: Skipper Cliche in Treatment: 18 Active Inactive Wound/Skin Impairment Nursing Diagnoses: Impaired tissue integrity Goals: Patient/caregiver will verbalize understanding of skin care regimen Date Initiated: 10/21/2020 Date Inactivated: 12/23/2020 Target Resolution Date: 12/21/2020 Goal Status: Met Ulcer/skin breakdown will have a volume reduction of 30% by week 4 Date Initiated: 10/21/2020 Date Inactivated: 11/25/2020 Target Resolution Date: 11/20/2020 Goal Status: Met Ulcer/skin breakdown will have a volume reduction of 50% by week 8 Date Initiated: 10/21/2020 Date Inactivated: 12/23/2020 Target Resolution Date: 12/21/2020 Goal Status: Met Ulcer/skin breakdown will have a volume reduction of 80% by week 12 Date Initiated: 10/21/2020 Date Inactivated: 02/17/2021 Target Resolution Date: 01/20/2021 Goal Status: Met Ulcer/skin breakdown will heal within 14 weeks Date Initiated: 10/21/2020 Target Resolution Date: 02/20/2021 Goal Status: Active Interventions: Assess patient/caregiver ability to obtain necessary supplies Assess patient/caregiver ability to perform ulcer/skin care regimen upon admission and as needed Assess ulceration(s) every visit Notes: Electronic Signature(s) Signed:  02/24/2021 3:35:03 PM By: Donnamarie Poag Entered By: Donnamarie Poag on 02/24/2021 15:25:10 Hesler, [name] ([id number]) -------------------------------------------------------------------------------- Pain Assessment Details Patient Name: [name], [name] Date of Service: 02/24/2021 3:00 PM Medical Record Number: [id number] Patient Account Number: [id number] Date of Birth/Sex: 01-13-1928 (87 y.o. F) Treating RN: Donnamarie Poag Primary Care Karissa Meenan: Viviana Simpler Other Clinician: Referring Minh Roanhorse: Viviana Simpler Treating Helmi Hechavarria/Extender: Skipper Cliche in Treatment: 18 Active Problems Location of Pain Severity and Description of Pain Patient Has Paino No Site Locations Rate the pain. Current Pain Level: 0 Pain Management and Medication Current Pain Management: Electronic Signature(s) Signed: 02/24/2021 3:35:03 PM By: Donnamarie Poag Entered By: Donnamarie Poag on 02/24/2021 15:07:57 Harpole, [name] ([id number]) -------------------------------------------------------------------------------- Patient/Caregiver Education Details Patient Name: [name] Date of Service: 02/24/2021 3:00 PM Medical Record Number: [id number] Patient Account Number: [id number] Date of Birth/Gender: 08-Aug-1927 (87 y.o. F) Treating RN: Donnamarie Poag Primary Care Physician: Viviana Simpler Other Clinician: Referring Physician: Viviana Simpler Treating Physician/Extender: Skipper Cliche in Treatment: 33 Education Assessment Education Provided To: Patient Education Topics Provided Nutrition: Offloading: Venous: Wound/Skin Impairment: Electronic Signature(s) Signed: 02/24/2021 4:31:30 PM By: Donnamarie Poag Entered By: Donnamarie Poag on 02/24/2021 15:51:46 Sieloff, [name] ([id number]) -------------------------------------------------------------------------------- Wound Assessment Details Patient Name: [name], [name] Date of Service: 02/24/2021 3:00 PM Medical Record Number: [id number] Patient Account  Number: [id number] Date of Birth/Sex: [birth year] (87 y.o. F) Treating RN: Donnamarie Poag Primary Care Fredrik Mogel: Viviana Simpler Other  Clinician: Referring Sayer Masini: Viviana Simpler Treating Vianny Schraeder/Extender: Skipper Cliche in Treatment: 18 Wound Status Wound Number: 1 Primary Pressure Ulcer Etiology: Wound Location: Right, Lateral Malleolus Wound Open Wounding Event: Gradually Appeared Status: Date Acquired: 10/22/2014 Comorbid Arrhythmia, Coronary Artery Disease, Hypertension, Weeks Of Treatment: 18 History: Peripheral Arterial Disease, History of pressure wounds, Clustered Wound: No Osteoarthritis Photos Wound Measurements Length: (cm) 0.5 Width: (cm) 0.4 Depth: (cm) 0.1 Area: (cm) 0.157 Volume: (cm) 0.016 % Reduction in Area: 80% % Reduction in Volume: 79.7% Epithelialization: Small (1-33%) Tunneling: No Undermining: No Wound Description Classification: Category/Stage III Wound Margin: Flat and Intact Exudate Amount: Medium Exudate Type: Serous Exudate Color: amber Foul Odor After Cleansing: No Slough/Fibrino Yes Wound Bed Granulation Amount: Large (67-100%) Exposed Structure Granulation Quality: Red, Pink Fascia Exposed: No Necrotic Amount: None Present (0%) Fat Layer (Subcutaneous Tissue) Exposed: Yes Tendon Exposed: No Muscle Exposed: No Joint Exposed: No Bone Exposed: No Treatment Notes Wound #1 (Malleolus) Wound Laterality: Right, Lateral Cleanser Soap and Water Discharge Instruction: Gently cleanse wound with antibacterial soap, rinse and pat dry prior to dressing wounds ELEONORA, PEELER ([id number]) Peri-Wound Care Topical Primary Dressing Hydrofera Blue Ready Transfer Foam, 2.5x2.5 (in/in) Discharge Instruction: Apply Hydrofera Blue Ready to wound bed as directed Secondary Dressing ABD Pad 5x9 (in/in) Discharge Instruction: Cover with ABD pad-also pad upper shin with extra ABD pad Secured With Compression Wrap Profore Lite LF 3 Multilayer  Compression Bandaging System Discharge Instruction: Apply 3 multi-layer wrap as prescribed. Compression Stockings Add-Ons Electronic Signature(s) Signed: 02/24/2021 3:35:03 PM By: Donnamarie Poag Entered By: Donnamarie Poag on 02/24/2021 15:20:47 Schiavo, [name] ([id number]) -------------------------------------------------------------------------------- Wound Assessment Details Patient Name: [name], [name] Date of Service: 02/24/2021 3:00 PM Medical Record Number: [id number] Patient Account Number: [id number] Date of Birth/Sex: 1928-05-05 (87 y.o. F) Treating RN: Donnamarie Poag Primary Care Aries Townley: Viviana Simpler Other Clinician: Referring Kahil Agner: Viviana Simpler Treating Jonika Critz/Extender: Skipper Cliche in Treatment: 18 Wound Status Wound Number: 2 Primary Pressure Ulcer Etiology: Wound Location: Left Calcaneus Wound Open Wounding Event: Gradually Appeared Status: Date Acquired: 07/09/2020 Comorbid Arrhythmia, Coronary Artery Disease, Hypertension, Weeks Of Treatment: 18 History: Peripheral Arterial Disease, History of pressure wounds, Clustered Wound: No Osteoarthritis Photos Wound Measurements Length: (cm) 0.3 Width: (cm) 0.3 Depth: (cm) 0.1 Area: (cm) 0.071 Volume: (cm) 0.007 % Reduction in Area: 95.4% % Reduction in Volume: 95.4% Epithelialization: Small (1-33%) Tunneling: No Undermining: No Wound Description Classification: Unstageable/Unclassified Exudate Amount: Medium Exudate Type: Serous Exudate Color: amber Foul Odor After Cleansing: No Slough/Fibrino Yes Wound Bed Granulation Amount: Large (67-100%) Exposed Structure Granulation Quality: Red, Pink Fascia Exposed: No Necrotic Amount: Small (1-33%) Fat Layer (Subcutaneous Tissue) Exposed: Yes Necrotic Quality: Adherent Slough Tendon Exposed: No Muscle Exposed: No Joint Exposed: No Bone Exposed: No Treatment Notes Wound #2 (Calcaneus) Wound Laterality: Left Cleanser Soap and Water Discharge  Instruction: Gently cleanse wound with antibacterial soap, rinse and pat dry prior to dressing wounds Peri-Wound Care JULEEN, SORRELS ([id number]) Topical Primary Dressing Hydrofera Blue Ready Transfer Foam, 2.5x2.5 (in/in) Discharge Instruction: Apply Hydrofera Blue Ready to wound bed as directed Secondary Dressing ABD Pad 5x9 (in/in) Discharge Instruction: Cover with ABD pad Secured With Compression Wrap Profore Lite LF 3 Multilayer Compression Bandaging System Discharge Instruction: Apply 3 multi-layer wrap as prescribed. Compression Stockings Add-Ons Electronic Signature(s) Signed: 02/24/2021 3:35:03 PM By: Donnamarie Poag Entered By: Donnamarie Poag on 02/24/2021 15:21:17 Eve, [name] ([id number]) -------------------------------------------------------------------------------- Wound Assessment Details Patient Name: ELIESE, KERWOOD Date of Service: 02/24/2021 3:00 PM Medical Record Number:  [id number] Patient Account Number: [id number] Date of Birth/Sex: 1928-05-05 (87 y.o. F) Treating RN: Donnamarie Poag Primary Care Makaylynn Bonillas: Viviana Simpler Other Clinician: Referring Ramaya Guile: Viviana Simpler Treating Erastus Bartolomei/Extender: Skipper Cliche in Treatment: 18 Wound Status Wound Number: 4 Primary Pressure Ulcer Etiology: Wound Location: Right Ischial Tuberosity Wound Open Wounding Event: Blister Status: Date Acquired: 01/16/2021 Comorbid Arrhythmia, Coronary Artery Disease, Hypertension, Weeks Of Treatment: 4 History: Peripheral Arterial Disease, History of pressure wounds, Clustered Wound: No Osteoarthritis Photos Wound Measurements Length: (cm) 0.3 Width: (cm) 0.3 Depth: (cm) 0.1 Area: (cm) 0.071 Volume: (cm) 0.007 % Reduction in Area: 97.6% % Reduction in Volume: 97.6% Epithelialization: Small (1-33%) Tunneling: No Undermining: No Wound Description Classification: Category/Stage III Exudate Amount: Medium Exudate Type: Serosanguineous Exudate Color: red,  brown Foul Odor After Cleansing: No Slough/Fibrino Yes Wound Bed Granulation Amount: Large (67-100%) Exposed Structure Granulation Quality: Pink Fascia Exposed: No Necrotic Amount: Small (1-33%) Fat Layer (Subcutaneous Tissue) Exposed: Yes Necrotic Quality: Adherent Slough Tendon Exposed: No Muscle Exposed: No Joint Exposed: No Bone Exposed: No Treatment Notes Wound #4 (Ischial Tuberosity) Wound Laterality: Right Cleanser Soap and Water Discharge Instruction: Gently cleanse wound with antibacterial soap, rinse and pat dry prior to dressing wounds Peri-Wound Care MARIANITA, BOTKIN ([id number]) Topical Primary Dressing Prisma 4.34 (in) Discharge Instruction: Moisten w/normal saline or sterile water; Cover wound as directed. Do not remove from wound bed. Secondary Dressing Mepilex Border Flex, 4x4 (in/in) Discharge Instruction: For protection: Apply to wound as directed. Do not cut. Secured With Compression Wrap Compression Stockings Add-Ons Electronic Signature(s) Signed: 02/24/2021 3:35:03 PM By: Donnamarie Poag Entered By: Donnamarie Poag on 02/24/2021 15:22:49 Laning, [name] ([id number]) -------------------------------------------------------------------------------- [place] Details Patient Name: [name] Date of Service: 02/24/2021 3:00 PM Medical Record Number: [id number] Patient Account Number: [id number] Date of Birth/Sex: 1928/05/23 (87 y.o. F) Treating RN: Donnamarie Poag Primary Care Janila Arrazola: Viviana Simpler Other Clinician: Referring Rei Contee: Viviana Simpler Treating Aniya Jolicoeur/Extender: Skipper Cliche in Treatment: 18 Vital Signs Time Taken: 15:06 Temperature (F): 98.1 Height (in): 66 Pulse (bpm): 75 Weight (lbs): 76 Respiratory Rate (breaths/min): 16 Body Mass Index (BMI): 12.3 Blood Pressure (mmHg): 165/75 Reference Range: 80 - 120 mg / dl Electronic Signature(s) Signed: 02/24/2021 3:35:03 PM By: Donnamarie Poag Entered ByDonnamarie Poag on 02/24/2021  15:07:49

## 2021-02-24 NOTE — Progress Notes (Addendum)
ZACHARIA, ARIF ([id number]) Visit Report for 02/24/2021 Chief Complaint Document Details Patient Name: [name], [name]. Date of Service: 02/24/2021 3:00 PM Medical Record Number: [id number] Patient Account Number: [id number] Date of Birth/Sex: December 31, 1927 (87 y.o. F) Treating RN: Donnamarie Poag Primary Care Provider: Viviana Simpler Other Clinician: Referring Provider: Viviana Simpler Treating Provider/Extender: Skipper Cliche in Treatment: 18 Information Obtained from: Patient Chief Complaint Pressure ulcer right ankle and left heel and right ischial/buttock pressure ulcer Electronic Signature(s) Signed: 02/24/2021 3:03:58 PM By: Worthy Keeler PA-C Entered By: Worthy Keeler on 02/24/2021 15:03:57 [name], [name] ([id number]) -------------------------------------------------------------------------------- HPI Details Patient Name: [name], [name] Date of Service: 02/24/2021 3:00 PM Medical Record Number: [id number] Patient Account Number: [id number] Date of Birth/Sex: 02-24-1928 (87 y.o. F) Treating RN: Donnamarie Poag Primary Care Provider: Viviana Simpler Other Clinician: Referring Provider: Viviana Simpler Treating Provider/Extender: Skipper Cliche in Treatment: 18 History of Present Illness HPI Description: 10/21/2020 upon evaluation today patient presents for initial inspection here in our clinic concerning issues that she has been having quite some time in regard to her ankle and since she has been in the hospital with regard to the heel. The ankle in fact has been 5-6 years at least I am told. With that being said the heel ulcer occurred when she was in the hospital in December for hip surgery when she fractured her hip. She also was in the hospital Tuesday for altered mental status. Really there was nothing that was identified as the cause for this. She did have a. Fortunately there does not appear to be any signs of infection she tells me that she has had she believes arterial  studies At [facility] clinic I could not find Those studies at this point. Nonetheless I will continue to look and see what I can find before Then. The patient also has a skin tear on her arm this occurred more recently when she bumped this at home. The patient does have a history of hypertension, coronary artery disease, and peripheral vascular disease stated. 10/28/2020 I was able to find the patient's chart currently which shows that she did have an arterial study performed in May 2019. This showed that she had a abnormal right toe brachial index and a normal left toe brachial index. She was noncompressible as far as ABIs were concerned. She did appear to have triphasic flow at that time. Unfortunately the wound that is commented on in the report that I printed off and read mentions the same wound on the ankle that we are still dealing with at this point. Unfortunately this has not healed. And its been quite sometime about 3 years now. Fortunately there does not appear to be any signs of active infection systemically at this point. I think that the patient has done well with the Santyl over the past week which is good news. Patient's caregiver which is her daughter-in-law is concerned about the fact that she really does not feel qualified to be able to change the dressings and take care of this issue. There does not appear to be any signs of anything untoward going on at this point. I think she is done a great job applying the [employer] and I think that has done a great job for the patient is for soften up some of the necrotic tissue. With that being said I think the Xeroform on the arm also has done excellent. In general I am very pleased with where we stand. And I told the patient's  daughter-in- law as well that I also feel like she has done a great job over the past week taking care of her mother. 11/11/2020 upon evaluation today patient appears to be doing well with regard to her wounds. She has been  tolerating the dressing changes without complication her daughters been applying the Santyl which has done a great job. Ho with that being said I think now that we have good arterial study showing we can go ahead and proceed with sharp debridement at this point. 11/25/2020 upon evaluation today patient appears to be doing well with regard to her wounds. The Santyl has really helped to clean things up and overall she is doing quite excellent at this point. Fortunately there does not appear to be any signs of infection which is great news and in general extremely pleased. I do believe some debridement is in order and hopefully will be able to get her into a collagen dressing after this. 12/23/2020 upon evaluation today patient appears to be doing well with regard to her wound all things considered. Unfortunately it does sound like her daughter decided to let this air out because she felt like it was getting so wet. It sounds like she got border gauze dressings instead of border foam therefore there is really Nothing to catch the excess drainage which I think is the problem they were worried about the smell. Fortunately there does not appear to be any signs of active infection which is great news. Nonetheless I do not think we want to leave this just open to air. 01/13/2021 upon evaluation today patient's wounds actually appear to be about as good as have seen since have been taking care of her. Fortunately there does not appear to be any evidence of infection which is great and overall the biggest issue I see is that of fluid buildup. I think we need to do something to try to help manage this. I think if we can control her edema we get the wounds to heal more effectively. 01/27/2021 upon evaluation today patient appears to be doing well in regard to the wounds on her right lateral malleolus as well as the left heel and the right ischial tuberosity is unfortunately a new area that has arisen since we last saw  her. She tells me currently that that is from sitting too much. With that being said this actually appears to be doing the worst of anything so far that I see today. Fortunately there does not appear to be any signs of infection this is at least good news. Compression wrap seem to be doing excellent for her as far as the legs are concerned. 02/03/2021 upon evaluation today patient appears to be doing well with regard to all of her wounds. She has been tolerating the dressing changes without complication. Fortunately there is no signs of active infection at this time. No fevers, chills, nausea, vomiting, or diarrhea. 02/10/2021 upon evaluation today patient appears to be doing well with regard to her wounds. With that being said there is some slight evidence of hypergranulation in regard to the right ankle in particular and a little bit in regard to the left heel. I think Hydrofera Blue might be a better option to go to at this point based on what I am seeing especially since both of these wounds in particular seem to be a little bit stalled. I discussed that with the patient and her daughter today. With regard to the hip this is doing great with the  collagen I think will get very close to complete closure I recommend we continue with the collagen. 02/17/2021 upon evaluation today patient appears to be doing excellent in regard to her heel ulcers. She has been tolerating the dressing changes without complication and overall I am extremely pleased with where things stand today. There does not appear to be any signs of active infection which is great news. Overall I may think that we are headed in the proper direction based on what I am seeing. 02/24/2021 upon evaluation today patient's wounds actually are showing signs of improvement in regard to her heels both are doing awesome. In regard to her hip location this is actually reopened after being closed last week and to be perfectly honest I am more concerned  here about the fact that pressures causing this issue I do not think it has anything to do with her not having the collagen in place again it was completely healed last week there was no reason for the collagen to be there. [name], [name] ([id number]) Electronic Signature(s) Signed: 02/24/2021 5:21:21 PM By: Worthy Keeler PA-C Entered By: Worthy Keeler on 02/24/2021 17:21:21 [name], [name] ([id number]) -------------------------------------------------------------------------------- Physical Exam Details Patient Name: [name], [name] Date of Service: 02/24/2021 3:00 PM Medical Record Number: [id number] Patient Account Number: [id number] Date of Birth/Sex: 10-Dec-1927 (87 y.o. F) Treating RN: Donnamarie Poag Primary Care Provider: Viviana Simpler Other Clinician: Referring Provider: Viviana Simpler Treating Provider/Extender: Skipper Cliche in Treatment: 7 Constitutional Well-nourished and well-hydrated in no acute distress. Respiratory normal breathing without difficulty. Psychiatric this patient is able to make decisions and demonstrates good insight into disease process. Alert and Oriented x 3. pleasant and cooperative. Notes Upon inspection patient's wounds again showed signs of good granulation epithelization there was no need for sharp debridement in regard to the heels which is great news. The hip also did not require sharp debridement this was very minimally broken down which is good news but in general I think that we need to make sure that she is keeping pressure off of the area that I believe is what is actually cause this to reopen. Electronic Signature(s) Signed: 02/24/2021 5:21:49 PM By: Worthy Keeler PA-C Entered By: Worthy Keeler on 02/24/2021 17:21:49 [name], [name] ([id number]) -------------------------------------------------------------------------------- Physician Orders Details Patient Name: [name], LUDLAM Date of Service: 02/24/2021 3:00 PM Medical  Record Number: [id number] Patient Account Number: [id number] Date of Birth/Sex: [birth year] (87 y.o. F) Treating RN: Donnamarie Poag Primary Care Provider: Viviana Simpler Other Clinician: Referring Provider: Viviana Simpler Treating Provider/Extender: Skipper Cliche in Treatment: 12 Verbal / Phone Orders: No Diagnosis Coding ICD-10 Coding Code Description L89.513 Pressure ulcer of right ankle, stage 3 L89.623 Pressure ulcer of left heel, stage 3 S51.802A Unspecified open wound of left forearm, initial encounter L89.312 Pressure ulcer of right buttock, stage 2 I10 Essential (primary) hypertension I25.10 Atherosclerotic heart disease of native coronary artery without angina pectoris I73.89 Other specified peripheral vascular diseases Follow-up Appointments o Return Appointment in 1 week. o Nurse Visit as needed Bathing/ Shower/ Hygiene o May shower with wound dressing protected with water repellent cover or cast protector. o No tub bath. Edema Control - Lymphedema / Segmental Compressive Device / Other Bilateral Lower Extremities o Optional: One layer of unna paste to top of compression wrap (to act as an anchor). o Elevate legs to the level of the heart and pump ankles as often as possible o Elevate leg(s) parallel to the floor when sitting. o  DO YOUR BEST to sleep in the bed at night. DO NOT sleep in your recliner. Long hours of sitting in a recliner leads to swelling of the legs and/or potential wounds on your backside. Off-Loading o Gel wheelchair cushion - prop when sitting or laying to avoid pressure on your hip wound o Turn and reposition every 2 hours Wound Treatment Wound #1 - Malleolus Wound Laterality: Right, Lateral Cleanser: Soap and Water 1 x Per Day/30 Days Discharge Instructions: Gently cleanse wound with antibacterial soap, rinse and pat dry prior to dressing wounds Primary Dressing: Hydrofera Blue Ready Transfer Foam, 2.5x2.5 (in/in) 1 x Per  Day/30 Days Discharge Instructions: Apply Hydrofera Blue Ready to wound bed as directed Secondary Dressing: ABD Pad 5x9 (in/in) 1 x Per Day/30 Days Discharge Instructions: Cover with ABD pad-also pad upper shin with extra ABD pad Compression Wrap: Profore Lite LF 3 Multilayer Compression Bandaging System 1 x Per Day/30 Days Discharge Instructions: Apply 3 multi-layer wrap as prescribed. Wound #2 - Calcaneus Wound Laterality: Left Cleanser: Soap and Water 1 x Per Day/30 Days Discharge Instructions: Gently cleanse wound with antibacterial soap, rinse and pat dry prior to dressing wounds Primary Dressing: Hydrofera Blue Ready Transfer Foam, 2.5x2.5 (in/in) 1 x Per Day/30 Days Discharge Instructions: Apply Hydrofera Blue Ready to wound bed as directed Secondary Dressing: ABD Pad 5x9 (in/in) 1 x Per Day/30 Days Discharge Instructions: Cover with ABD pad [name], [name] ([id number]) Compression Wrap: Profore Lite LF 3 Multilayer Compression Bandaging System 1 x Per Day/30 Days Discharge Instructions: Apply 3 multi-layer wrap as prescribed. Wound #4 - Ischial Tuberosity Wound Laterality: Right Cleanser: Soap and Water 3 x Per Week/30 Days Discharge Instructions: Gently cleanse wound with antibacterial soap, rinse and pat dry prior to dressing wounds Primary Dressing: Prisma 4.34 (in) 3 x Per Week/30 Days Discharge Instructions: Moisten w/normal saline or sterile water; Cover wound as directed. Do not remove from wound bed. Secondary Dressing: Mepilex Border Flex, 4x4 (in/in) (Generic) 3 x Per Week/30 Days Discharge Instructions: For protection: Apply to wound as directed. Do not cut. Electronic Signature(s) Signed: 02/24/2021 4:31:30 PM By: Donnamarie Poag Signed: 02/24/2021 5:46:21 PM By: Worthy Keeler PA-C Entered By: Donnamarie Poag on 02/24/2021 15:51:10 [name], [name] ([id number]) -------------------------------------------------------------------------------- Problem List Details Patient Name:  BREYONNA, SORRELLS Date of Service: 02/24/2021 3:00 PM Medical Record Number: [id number] Patient Account Number: [id number] Date of Birth/Sex: 1927-08-25 (87 y.o. F) Treating RN: Donnamarie Poag Primary Care Provider: Viviana Simpler Other Clinician: Referring Provider: Viviana Simpler Treating Provider/Extender: Skipper Cliche in Treatment: 18 Active Problems ICD-10 Encounter Code Description Active Date MDM Diagnosis L89.513 Pressure ulcer of right ankle, stage 3 10/21/2020 No Yes L89.623 Pressure ulcer of left heel, stage 3 10/21/2020 No Yes S51.802A Unspecified open wound of left forearm, initial encounter 10/21/2020 No Yes L89.312 Pressure ulcer of right buttock, stage 2 01/27/2021 No Yes I10 Essential (primary) hypertension 10/21/2020 No Yes I25.10 Atherosclerotic heart disease of native coronary artery without angina 10/21/2020 No Yes pectoris I73.89 Other specified peripheral vascular diseases 10/21/2020 No Yes Inactive Problems Resolved Problems Electronic Signature(s) Signed: 02/24/2021 3:03:52 PM By: Worthy Keeler PA-C Entered By: Worthy Keeler on 02/24/2021 15:03:51 Jolliff, [name] ([id number]) -------------------------------------------------------------------------------- Progress Note Details Patient Name: [name] Date of Service: 02/24/2021 3:00 PM Medical Record Number: [id number] Patient Account Number: [id number] Date of Birth/Sex: [birth year] (87 y.o. F) Treating RN: Donnamarie Poag Primary Care Provider: Viviana Simpler Other Clinician: Referring Provider: Viviana Simpler Treating Provider/Extender: Skipper Cliche  in Treatment: 18 Subjective Chief Complaint Information obtained from Patient Pressure ulcer right ankle and left heel and right ischial/buttock pressure ulcer History of Present Illness (HPI) 10/21/2020 upon evaluation today patient presents for initial inspection here in our clinic concerning issues that she has been having quite some time in  regard to her ankle and since she has been in the hospital with regard to the heel. The ankle in fact has been 5-6 years at least I am told. With that being said the heel ulcer occurred when she was in the hospital in December for hip surgery when she fractured her hip. She also was in the hospital Tuesday for altered mental status. Really there was nothing that was identified as the cause for this. She did have a. Fortunately there does not appear to be any signs of infection she tells me that she has had she believes arterial studies At [facility] clinic I could not find Those studies at this point. Nonetheless I will continue to look and see what I can find before Then. The patient also has a skin tear on her arm this occurred more recently when she bumped this at home. The patient does have a history of hypertension, coronary artery disease, and peripheral vascular disease stated. 10/28/2020 I was able to find the patient's chart currently which shows that she did have an arterial study performed in May 2019. This showed that she had a abnormal right toe brachial index and a normal left toe brachial index. She was noncompressible as far as ABIs were concerned. She did appear to have triphasic flow at that time. Unfortunately the wound that is commented on in the report that I printed off and read mentions the same wound on the ankle that we are still dealing with at this point. Unfortunately this has not healed. And its been quite sometime about 3 years now. Fortunately there does not appear to be any signs of active infection systemically at this point. I think that the patient has done well with the Santyl over the past week which is good news. Patient's caregiver which is her daughter-in-law is concerned about the fact that she really does not feel qualified to be able to change the dressings and take care of this issue. There does not appear to be any signs of anything untoward going on at this  point. I think she is done a great job applying the [employer] and I think that has done a great job for the patient is for soften up some of the necrotic tissue. With that being said I think the Xeroform on the arm also has done excellent. In general I am very pleased with where we stand. And I told the patient's daughter-in- law as well that I also feel like she has done a great job over the past week taking care of her mother. 11/11/2020 upon evaluation today patient appears to be doing well with regard to her wounds. She has been tolerating the dressing changes without complication her daughters been applying the Santyl which has done a great job. Ho with that being said I think now that we have good arterial study showing we can go ahead and proceed with sharp debridement at this point. 11/25/2020 upon evaluation today patient appears to be doing well with regard to her wounds. The Santyl has really helped to clean things up and overall she is doing quite excellent at this point. Fortunately there does not appear to be any signs of infection  which is great news and in general extremely pleased. I do believe some debridement is in order and hopefully will be able to get her into a collagen dressing after this. 12/23/2020 upon evaluation today patient appears to be doing well with regard to her wound all things considered. Unfortunately it does sound like her daughter decided to let this air out because she felt like it was getting so wet. It sounds like she got border gauze dressings instead of border foam therefore there is really Nothing to catch the excess drainage which I think is the problem they were worried about the smell. Fortunately there does not appear to be any signs of active infection which is great news. Nonetheless I do not think we want to leave this just open to air. 01/13/2021 upon evaluation today patient's wounds actually appear to be about as good as have seen since have been taking  care of her. Fortunately there does not appear to be any evidence of infection which is great and overall the biggest issue I see is that of fluid buildup. I think we need to do something to try to help manage this. I think if we can control her edema we get the wounds to heal more effectively. 01/27/2021 upon evaluation today patient appears to be doing well in regard to the wounds on her right lateral malleolus as well as the left heel and the right ischial tuberosity is unfortunately a new area that has arisen since we last saw her. She tells me currently that that is from sitting too much. With that being said this actually appears to be doing the worst of anything so far that I see today. Fortunately there does not appear to be any signs of infection this is at least good news. Compression wrap seem to be doing excellent for her as far as the legs are concerned. 02/03/2021 upon evaluation today patient appears to be doing well with regard to all of her wounds. She has been tolerating the dressing changes without complication. Fortunately there is no signs of active infection at this time. No fevers, chills, nausea, vomiting, or diarrhea. 02/10/2021 upon evaluation today patient appears to be doing well with regard to her wounds. With that being said there is some slight evidence of hypergranulation in regard to the right ankle in particular and a little bit in regard to the left heel. I think Hydrofera Blue might be a better option to go to at this point based on what I am seeing especially since both of these wounds in particular seem to be a little bit stalled. I discussed that with the patient and her daughter today. With regard to the hip this is doing great with the collagen I think will get very close to complete closure I recommend we continue with the collagen. 02/17/2021 upon evaluation today patient appears to be doing excellent in regard to her heel ulcers. She has been tolerating the  dressing changes without complication and overall I am extremely pleased with where things stand today. There does not appear to be any signs of active infection which is great news. Overall I may think that we are headed in the proper direction based on what I am seeing. 02/24/2021 upon evaluation today patient's wounds actually are showing signs of improvement in regard to her heels both are doing awesome. In [place], [place] ([id number]) regard to her hip location this is actually reopened after being closed last week and to be perfectly honest I  am more concerned here about the fact that pressures causing this issue I do not think it has anything to do with her not having the collagen in place again it was completely healed last week there was no reason for the collagen to be there. Objective Constitutional Well-nourished and well-hydrated in no acute distress. Vitals Time Taken: 3:06 PM, Height: 66 in, Weight: 76 lbs, BMI: 12.3, Temperature: 98.1 F, Pulse: 75 bpm, Respiratory Rate: 16 breaths/min, Blood Pressure: 165/75 mmHg. Respiratory normal breathing without difficulty. Psychiatric this patient is able to make decisions and demonstrates good insight into disease process. Alert and Oriented x 3. pleasant and cooperative. General Notes: Upon inspection patient's wounds again showed signs of good granulation epithelization there was no need for sharp debridement in regard to the heels which is great news. The hip also did not require sharp debridement this was very minimally broken down which is good news but in general I think that we need to make sure that she is keeping pressure off of the area that I believe is what is actually cause this to reopen. Integumentary (Hair, Skin) Wound #1 status is Open. Original cause of wound was Gradually Appeared. The date acquired was: 10/22/2014. The wound has been in treatment 18 weeks. The wound is located on the Right,Lateral Malleolus. The wound  measures 0.5cm length x 0.4cm width x 0.1cm depth; 0.157cm^2 area and 0.016cm^3 volume. There is Fat Layer (Subcutaneous Tissue) exposed. There is no tunneling or undermining noted. There is a medium amount of serous drainage noted. The wound margin is flat and intact. There is large (67-100%) red, pink granulation within the wound bed. There is no necrotic tissue within the wound bed. Wound #2 status is Open. Original cause of wound was Gradually Appeared. The date acquired was: 07/09/2020. The wound has been in treatment 18 weeks. The wound is located on the Left Calcaneus. The wound measures 0.3cm length x 0.3cm width x 0.1cm depth; 0.071cm^2 area and 0.007cm^3 volume. There is Fat Layer (Subcutaneous Tissue) exposed. There is no tunneling or undermining noted. There is a medium amount of serous drainage noted. There is large (67-100%) red, pink granulation within the wound bed. There is a small (1-33%) amount of necrotic tissue within the wound bed including Adherent Slough. Wound #4 status is Open. Original cause of wound was Blister. The date acquired was: 01/16/2021. The wound has been in treatment 4 weeks. The wound is located on the Right Ischial Tuberosity. The wound measures 0.3cm length x 0.3cm width x 0.1cm depth; 0.071cm^2 area and 0.007cm^3 volume. There is Fat Layer (Subcutaneous Tissue) exposed. There is no tunneling or undermining noted. There is a medium amount of serosanguineous drainage noted. There is large (67-100%) pink granulation within the wound bed. There is a small (1-33%) amount of necrotic tissue within the wound bed including Adherent Slough. Assessment Active Problems ICD-10 Pressure ulcer of right ankle, stage 3 Pressure ulcer of left heel, stage 3 Unspecified open wound of left forearm, initial encounter Pressure ulcer of right buttock, stage 2 Essential (primary) hypertension Atherosclerotic heart disease of native coronary artery without angina pectoris Other  specified peripheral vascular diseases Procedures [name], [name] ([id number]) Wound #1 Pre-procedure diagnosis of Wound #1 is a Pressure Ulcer located on the Right,Lateral Malleolus . There was a Three Layer Compression Therapy Procedure by Donnamarie Poag, RN. Post procedure Diagnosis Wound #1: Same as Pre-Procedure Wound #2 Pre-procedure diagnosis of Wound #2 is a Pressure Ulcer located on the Left Calcaneus . There  was a Three Layer Compression Therapy Procedure by Donnamarie Poag, RN. Post procedure Diagnosis Wound #2: Same as Pre-Procedure Plan Follow-up Appointments: Return Appointment in 1 week. Nurse Visit as needed Bathing/ Shower/ Hygiene: May shower with wound dressing protected with water repellent cover or cast protector. No tub bath. Edema Control - Lymphedema / Segmental Compressive Device / Other: Optional: One layer of unna paste to top of compression wrap (to act as an anchor). Elevate legs to the level of the heart and pump ankles as often as possible Elevate leg(s) parallel to the floor when sitting. DO YOUR BEST to sleep in the bed at night. DO NOT sleep in your recliner. Long hours of sitting in a recliner leads to swelling of the legs and/or potential wounds on your backside. Off-Loading: Gel wheelchair cushion - prop when sitting or laying to avoid pressure on your hip wound Turn and reposition every 2 hours WOUND #1: - Malleolus Wound Laterality: Right, Lateral Cleanser: Soap and Water 1 x Per Day/30 Days Discharge Instructions: Gently cleanse wound with antibacterial soap, rinse and pat dry prior to dressing wounds Primary Dressing: Hydrofera Blue Ready Transfer Foam, 2.5x2.5 (in/in) 1 x Per Day/30 Days Discharge Instructions: Apply Hydrofera Blue Ready to wound bed as directed Secondary Dressing: ABD Pad 5x9 (in/in) 1 x Per Day/30 Days Discharge Instructions: Cover with ABD pad-also pad upper shin with extra ABD pad Compression Wrap: Profore Lite LF 3 Multilayer  Compression Bandaging System 1 x Per Day/30 Days Discharge Instructions: Apply 3 multi-layer wrap as prescribed. WOUND #2: - Calcaneus Wound Laterality: Left Cleanser: Soap and Water 1 x Per Day/30 Days Discharge Instructions: Gently cleanse wound with antibacterial soap, rinse and pat dry prior to dressing wounds Primary Dressing: Hydrofera Blue Ready Transfer Foam, 2.5x2.5 (in/in) 1 x Per Day/30 Days Discharge Instructions: Apply Hydrofera Blue Ready to wound bed as directed Secondary Dressing: ABD Pad 5x9 (in/in) 1 x Per Day/30 Days Discharge Instructions: Cover with ABD pad Compression Wrap: Profore Lite LF 3 Multilayer Compression Bandaging System 1 x Per Day/30 Days Discharge Instructions: Apply 3 multi-layer wrap as prescribed. WOUND #4: - Ischial Tuberosity Wound Laterality: Right Cleanser: Soap and Water 3 x Per Week/30 Days Discharge Instructions: Gently cleanse wound with antibacterial soap, rinse and pat dry prior to dressing wounds Primary Dressing: Prisma 4.34 (in) 3 x Per Week/30 Days Discharge Instructions: Moisten w/normal saline or sterile water; Cover wound as directed. Do not remove from wound bed. Secondary Dressing: Mepilex Border Flex, 4x4 (in/in) (Generic) 3 x Per Week/30 Days Discharge Instructions: For protection: Apply to wound as directed. Do not cut. 1. I would recommend currently that we continue with collagen to all the right hip location I also think she needs to be very diligent about making sure nothing pressure wise is causing any issues here. 2. I am also going to recommend that we go ahead and utilize Hydrofera Blue to the heels which seems to be doing a great job I see no reason to make any changes here. 3. I am also going to recommend that we go ahead and continue with the compression wraps were using a 3 layer compression wrap on the legs bilaterally to help with edema control. We will see patient back for reevaluation in 1 week here in the clinic. If  anything worsens or changes patient will contact our office for additional recommendations. Electronic Signature(s) Signed: 02/24/2021 5:22:41 PM By: Eyvonne Mechanic, [name] ([id number]) Entered By: Worthy Keeler on 02/24/2021  17:22:41 AMELIANNA, MITRANI ([id number]) -------------------------------------------------------------------------------- SuperBill Details Patient Name: HAGAR, ZANARDI. Date of Service: 02/24/2021 Medical Record Number: [id number] Patient Account Number: [id number] Date of Birth/Sex: 29-Jan-1928 (87 y.o. F) Treating RN: Donnamarie Poag Primary Care Provider: Viviana Simpler Other Clinician: Referring Provider: Viviana Simpler Treating Provider/Extender: Skipper Cliche in Treatment: 18 Diagnosis Coding ICD-10 Codes Code Description L89.513 Pressure ulcer of right ankle, stage 3 L89.623 Pressure ulcer of left heel, stage 3 S51.802A Unspecified open wound of left forearm, initial encounter L89.312 Pressure ulcer of right buttock, stage 2 I10 Essential (primary) hypertension I25.10 Atherosclerotic heart disease of native coronary artery without angina pectoris I73.89 Other specified peripheral vascular diseases Facility Procedures CPT4: Description Modifier Quantity Code [id number] BILATERAL: Application of multi-layer venous compression system; leg (below knee), including 1 ankle and foot. Physician Procedures CPT4 Code: [id number] Description: [id number] - WC PHYS LEVEL 4 - EST PT Modifier: Quantity: 1 CPT4 Code: Description: ICD-10 Diagnosis Description L89.513 Pressure ulcer of right ankle, stage 3 L89.623 Pressure ulcer of left heel, stage 3 S51.802A Unspecified open wound of left forearm, initial encounter L89.312 Pressure ulcer of right buttock, stage 2 Modifier: Quantity: Electronic Signature(s) Signed: 02/24/2021 5:23:06 PM By: Worthy Keeler PA-C Previous Signature: 02/24/2021 4:31:30 PM Version By: Donnamarie Poag Entered By: Worthy Keeler on  02/24/2021 17:23:05

## 2021-03-03 ENCOUNTER — Other Ambulatory Visit: Payer: Self-pay

## 2021-03-03 ENCOUNTER — Encounter: Payer: PPO | Admitting: Physician Assistant

## 2021-03-03 DIAGNOSIS — L89623 Pressure ulcer of left heel, stage 3: Secondary | ICD-10-CM | POA: Diagnosis not present

## 2021-03-03 DIAGNOSIS — L89313 Pressure ulcer of right buttock, stage 3: Secondary | ICD-10-CM | POA: Diagnosis not present

## 2021-03-03 DIAGNOSIS — L89513 Pressure ulcer of right ankle, stage 3: Secondary | ICD-10-CM | POA: Diagnosis not present

## 2021-03-03 NOTE — Progress Notes (Addendum)
GAY, BYWATER ([id number]) Visit Report for 03/03/2021 Chief Complaint Document Details Patient Name: [name], JABOUR. Date of Service: 03/03/2021 3:00 PM Medical Record Number: [id number] Patient Account Number: [id number] Date of Birth/Sex: [birth year] (87 y.o. F) Treating RN: [name] Primary Care Provider: Viviana Downs Other Clinician: Referring Provider: Viviana Downs Treating Provider/Extender: [name] in Treatment: 19 Information Obtained from: Patient Chief Complaint Pressure ulcer right ankle and left heel and right ischial/buttock pressure ulcer Electronic Signature(s) Signed: 03/03/2021 2:44:07 PM By: Elizabeth Keeler PA-C Entered By: [name] on 03/03/2021 14:44:07 [name], [name] ([id number]) -------------------------------------------------------------------------------- HPI Details Patient Name: [name], [name] Date of Service: 03/03/2021 3:00 PM Medical Record Number: [id number] Patient Account Number: [id number] Date of Birth/Sex: 1928-01-12 (87 y.o. F) Treating RN: [name] Primary Care Provider: Viviana Downs Other Clinician: Referring Provider: Viviana Downs Treating Provider/Extender: [name] in Treatment: 19 History of Present Illness HPI Description: 10/21/2020 upon evaluation today patient presents for initial inspection here in our clinic concerning issues that she has been having quite some time in regard to her ankle and since she has been in the hospital with regard to the heel. The ankle in fact has been 5-6 years at least I am told. With that being said the heel ulcer occurred when she was in the hospital in December for hip surgery when she fractured her hip. She also was in the hospital Tuesday for altered mental status. Really there was nothing that was identified as the cause for this. She did have a. Fortunately there does not appear to be any signs of infection she tells me that she has had she believes arterial  studies At [facility] clinic I could not find Those studies at this point. Nonetheless I will continue to look and see what I can find before Then. The patient also has a skin tear on her arm this occurred more recently when she bumped this at home. The patient does have a history of hypertension, coronary artery disease, and peripheral vascular disease stated. 10/28/2020 I was able to find the patient's chart currently which shows that she did have an arterial study performed in May 2019. This showed that she had a abnormal right toe brachial index and a normal left toe brachial index. She was noncompressible as far as ABIs were concerned. She did appear to have triphasic flow at that time. Unfortunately the wound that is commented on in the report that I printed off and read mentions the same wound on the ankle that we are still dealing with at this point. Unfortunately this has not healed. And its been quite sometime about 3 years now. Fortunately there does not appear to be any signs of active infection systemically at this point. I think that the patient has done well with the Santyl over the past week which is good news. Patient's caregiver which is her daughter-in-law is concerned about the fact that she really does not feel qualified to be able to change the dressings and take care of this issue. There does not appear to be any signs of anything untoward going on at this point. I think she is done a great job applying the [employer] and I think that has done a great job for the patient is for soften up some of the necrotic tissue. With that being said I think the Xeroform on the arm also has done excellent. In general I am very pleased with where we stand. And I told the patient's  daughter-in- law as well that I also feel like she has done a great job over the past week taking care of her mother. 11/11/2020 upon evaluation today patient appears to be doing well with regard to her wounds. She has been  tolerating the dressing changes without complication her daughters been applying the Santyl which has done a great job. Ho with that being said I think now that we have good arterial study showing we can go ahead and proceed with sharp debridement at this point. 11/25/2020 upon evaluation today patient appears to be doing well with regard to her wounds. The Santyl has really helped to clean things up and overall she is doing quite excellent at this point. Fortunately there does not appear to be any signs of infection which is great news and in general extremely pleased. I do believe some debridement is in order and hopefully will be able to get her into a collagen dressing after this. 12/23/2020 upon evaluation today patient appears to be doing well with regard to her wound all things considered. Unfortunately it does sound like her daughter decided to let this air out because she felt like it was getting so wet. It sounds like she got border gauze dressings instead of border foam therefore there is really Nothing to catch the excess drainage which I think is the problem they were worried about the smell. Fortunately there does not appear to be any signs of active infection which is great news. Nonetheless I do not think we want to leave this just open to air. 01/13/2021 upon evaluation today patient's wounds actually appear to be about as good as have seen since have been taking care of her. Fortunately there does not appear to be any evidence of infection which is great and overall the biggest issue I see is that of fluid buildup. I think we need to do something to try to help manage this. I think if we can control her edema we get the wounds to heal more effectively. 01/27/2021 upon evaluation today patient appears to be doing well in regard to the wounds on her right lateral malleolus as well as the left heel and the right ischial tuberosity is unfortunately a new area that has arisen since we last saw  her. She tells me currently that that is from sitting too much. With that being said this actually appears to be doing the worst of anything so far that I see today. Fortunately there does not appear to be any signs of infection this is at least good news. Compression wrap seem to be doing excellent for her as far as the legs are concerned. 02/03/2021 upon evaluation today patient appears to be doing well with regard to all of her wounds. She has been tolerating the dressing changes without complication. Fortunately there is no signs of active infection at this time. No fevers, chills, nausea, vomiting, or diarrhea. 02/10/2021 upon evaluation today patient appears to be doing well with regard to her wounds. With that being said there is some slight evidence of hypergranulation in regard to the right ankle in particular and a little bit in regard to the left heel. I think Hydrofera Blue might be a better option to go to at this point based on what I am seeing especially since both of these wounds in particular seem to be a little bit stalled. I discussed that with the patient and her daughter today. With regard to the hip this is doing great with the  collagen I think will get very close to complete closure I recommend we continue with the collagen. 02/17/2021 upon evaluation today patient appears to be doing excellent in regard to her heel ulcers. She has been tolerating the dressing changes without complication and overall I am extremely pleased with where things stand today. There does not appear to be any signs of active infection which is great news. Overall I may think that we are headed in the proper direction based on what I am seeing. 02/24/2021 upon evaluation today patient's wounds actually are showing signs of improvement in regard to her heels both are doing awesome. In regard to her hip location this is actually reopened after being closed last week and to be perfectly honest I am more concerned  here about the fact that pressures causing this issue I do not think it has anything to do with her not having the collagen in place again it was completely healed last week there was no reason for the collagen to be there. [name], [name] ([id number]) 03/03/2021 upon evaluation today patient appears to be doing well with regard to her wounds. Everything is showing signs of improvement and doing some much better as far as the overall size of the wound. Fortunately I think we are headed in the right direction. Electronic Signature(s) Signed: 03/03/2021 3:48:55 PM By: Elizabeth Keeler PA-C Entered By: [name] on 03/03/2021 15:48:54 [name], [name] ([id number]) -------------------------------------------------------------------------------- Physical Exam Details Patient Name: [name], [name] Date of Service: 03/03/2021 3:00 PM Medical Record Number: [id number] Patient Account Number: [id number] Date of Birth/Sex: [birth year] (87 y.o. F) Treating RN: [name] Primary Care Provider: Viviana Downs Other Clinician: Referring Provider: Viviana Downs Treating Provider/Extender: [name] in Treatment: 35 Constitutional Well-nourished and well-hydrated in no acute distress. Respiratory normal breathing without difficulty. Psychiatric this patient is able to make decisions and demonstrates good insight into disease process. Alert and Oriented x 3. pleasant and cooperative. Notes Upon inspection patient's wound bed actually showed signs of good granulation epithelization pretty much across the board. There does not appear to be any evidence of active infection which is great news and overall I am extremely pleased with where things stand today. No fevers, chills, nausea, vomiting, or diarrhea. Electronic Signature(s) Signed: 03/03/2021 3:49:13 PM By: Elizabeth Keeler PA-C Entered By: [name] on 03/03/2021 15:49:13 Sapp, [name]  ([id number]) -------------------------------------------------------------------------------- Physician Orders Details Patient Name: [name], [name] Date of Service: 03/03/2021 3:00 PM Medical Record Number: [id number] Patient Account Number: [id number] Date of Birth/Sex: 1927/07/18 (87 y.o. F) Treating RN: [name] Primary Care Provider: Viviana Downs Other Clinician: Referring Provider: Viviana Downs Treating Provider/Extender: [name] in Treatment: 20 Verbal / Phone Orders: No Diagnosis Coding ICD-10 Coding Code Description L89.513 Pressure ulcer of right ankle, stage 3 L89.623 Pressure ulcer of left heel, stage 3 S51.802A Unspecified open wound of left forearm, initial encounter L89.312 Pressure ulcer of right buttock, stage 2 I10 Essential (primary) hypertension I25.10 Atherosclerotic heart disease of native coronary artery without angina pectoris I73.89 Other specified peripheral vascular diseases Follow-up Appointments o Return Appointment in 1 week. o Nurse Visit as needed Bathing/ Shower/ Hygiene o May shower with wound dressing protected with water repellent cover or cast protector. o No tub bath. Edema Control - Lymphedema / Segmental Compressive Device / Other Bilateral Lower Extremities o Optional: One layer of unna paste to top of compression wrap (to act as an anchor). o Elevate legs to the level of  the heart and pump ankles as often as possible o Elevate leg(s) parallel to the floor when sitting. o DO YOUR BEST to sleep in the bed at night. DO NOT sleep in your recliner. Long hours of sitting in a recliner leads to swelling of the legs and/or potential wounds on your backside. Off-Loading o Gel wheelchair cushion - prop when sitting or laying to avoid pressure on your hip wound o Turn and reposition every 2 hours Wound Treatment Wound #1 - Malleolus Wound Laterality: Right, Lateral Cleanser: Soap and Water 1 x Per Day/30  Days Discharge Instructions: Gently cleanse wound with antibacterial soap, rinse and pat dry prior to dressing wounds Primary Dressing: Hydrofera Blue Ready Transfer Foam, 2.5x2.5 (in/in) 1 x Per Day/30 Days Discharge Instructions: Apply Hydrofera Blue Ready to wound bed as directed Secondary Dressing: ABD Pad 5x9 (in/in) 1 x Per Day/30 Days Discharge Instructions: Cover with ABD pad-also pad upper shin with extra ABD pad Compression Wrap: Profore Lite LF 3 Multilayer Compression Bandaging System 1 x Per Day/30 Days Discharge Instructions: Apply 3 multi-layer wrap as prescribed. Wound #2 - Calcaneus Wound Laterality: Left Cleanser: Soap and Water 1 x Per Day/30 Days Discharge Instructions: Gently cleanse wound with antibacterial soap, rinse and pat dry prior to dressing wounds Primary Dressing: Hydrofera Blue Ready Transfer Foam, 2.5x2.5 (in/in) 1 x Per Day/30 Days Discharge Instructions: Apply Hydrofera Blue Ready to wound bed as directed Secondary Dressing: ABD Pad 5x9 (in/in) 1 x Per Day/30 Days Discharge Instructions: Cover with ABD pad [name], [name] ([id number]) Compression Wrap: Profore Lite LF 3 Multilayer Compression Bandaging System 1 x Per Day/30 Days Discharge Instructions: Apply 3 multi-layer wrap as prescribed. Wound #4 - Ischial Tuberosity Wound Laterality: Right Cleanser: Soap and Water 3 x Per Week/30 Days Discharge Instructions: Gently cleanse wound with antibacterial soap, rinse and pat dry prior to dressing wounds Primary Dressing: Prisma 4.34 (in) 3 x Per Week/30 Days Discharge Instructions: Moisten w/normal saline or sterile water; Cover wound as directed. Do not remove from wound bed. Secondary Dressing: Mepilex Border Flex, 4x4 (in/in) (Generic) 3 x Per Week/30 Days Discharge Instructions: For protection: Apply to wound as directed. Do not cut. Electronic Signature(s) Signed: 03/03/2021 3:53:32 PM By: Elizabeth Keeler PA-C Signed: 03/03/2021 4:19:24 PM By: [name] Entered By: [name] on 03/03/2021 15:46:54 Carmean, [name] ([id number]) -------------------------------------------------------------------------------- Problem List Details Patient Name: LARETA, LEGGITT Date of Service: 03/03/2021 3:00 PM Medical Record Number: [id number] Patient Account Number: [id number] Date of Birth/Sex: 04-Jan-1928 (87 y.o. F) Treating RN: [name] Primary Care Provider: Viviana Downs Other Clinician: Referring Provider: Viviana Downs Treating Provider/Extender: [name] in Treatment: 19 Active Problems ICD-10 Encounter Code Description Active Date MDM Diagnosis L89.513 Pressure ulcer of right ankle, stage 3 10/21/2020 No Yes L89.623 Pressure ulcer of left heel, stage 3 10/21/2020 No Yes S51.802A Unspecified open wound of left forearm, initial encounter 10/21/2020 No Yes L89.312 Pressure ulcer of right buttock, stage 2 01/27/2021 No Yes I10 Essential (primary) hypertension 10/21/2020 No Yes I25.10 Atherosclerotic heart disease of native coronary artery without angina 10/21/2020 No Yes pectoris I73.89 Other specified peripheral vascular diseases 10/21/2020 No Yes Inactive Problems Resolved Problems Electronic Signature(s) Signed: 03/03/2021 2:43:58 PM By: Elizabeth Keeler PA-C Entered By: [name] on 03/03/2021 14:43:58 Fackrell, [name] ([id number]) -------------------------------------------------------------------------------- Progress Note Details Patient Name: [name] Date of Service: 03/03/2021 3:00 PM Medical Record Number: [id number] Patient Account Number: [id number] Date of Birth/Sex: 12/16/1927 (87 y.o. F) Treating  RN: [name] Primary Care Provider: Viviana Downs Other Clinician: Referring Provider: Viviana Downs Treating Provider/Extender: [name] in Treatment: 19 Subjective Chief Complaint Information obtained from Patient Pressure ulcer right ankle and left heel and right ischial/buttock  pressure ulcer History of Present Illness (HPI) 10/21/2020 upon evaluation today patient presents for initial inspection here in our clinic concerning issues that she has been having quite some time in regard to her ankle and since she has been in the hospital with regard to the heel. The ankle in fact has been 5-6 years at least I am told. With that being said the heel ulcer occurred when she was in the hospital in December for hip surgery when she fractured her hip. She also was in the hospital Tuesday for altered mental status. Really there was nothing that was identified as the cause for this. She did have a. Fortunately there does not appear to be any signs of infection she tells me that she has had she believes arterial studies At [facility] clinic I could not find Those studies at this point. Nonetheless I will continue to look and see what I can find before Then. The patient also has a skin tear on her arm this occurred more recently when she bumped this at home. The patient does have a history of hypertension, coronary artery disease, and peripheral vascular disease stated. 10/28/2020 I was able to find the patient's chart currently which shows that she did have an arterial study performed in May 2019. This showed that she had a abnormal right toe brachial index and a normal left toe brachial index. She was noncompressible as far as ABIs were concerned. She did appear to have triphasic flow at that time. Unfortunately the wound that is commented on in the report that I printed off and read mentions the same wound on the ankle that we are still dealing with at this point. Unfortunately this has not healed. And its been quite sometime about 3 years now. Fortunately there does not appear to be any signs of active infection systemically at this point. I think that the patient has done well with the Santyl over the past week which is good news. Patient's caregiver which is her daughter-in-law is  concerned about the fact that she really does not feel qualified to be able to change the dressings and take care of this issue. There does not appear to be any signs of anything untoward going on at this point. I think she is done a great job applying the [employer] and I think that has done a great job for the patient is for soften up some of the necrotic tissue. With that being said I think the Xeroform on the arm also has done excellent. In general I am very pleased with where we stand. And I told the patient's daughter-in- law as well that I also feel like she has done a great job over the past week taking care of her mother. 11/11/2020 upon evaluation today patient appears to be doing well with regard to her wounds. She has been tolerating the dressing changes without complication her daughters been applying the Santyl which has done a great job. Ho with that being said I think now that we have good arterial study showing we can go ahead and proceed with sharp debridement at this point. 11/25/2020 upon evaluation today patient appears to be doing well with regard to her wounds. The Santyl has really helped to clean things up and overall  she is doing quite excellent at this point. Fortunately there does not appear to be any signs of infection which is great news and in general extremely pleased. I do believe some debridement is in order and hopefully will be able to get her into a collagen dressing after this. 12/23/2020 upon evaluation today patient appears to be doing well with regard to her wound all things considered. Unfortunately it does sound like her daughter decided to let this air out because she felt like it was getting so wet. It sounds like she got border gauze dressings instead of border foam therefore there is really Nothing to catch the excess drainage which I think is the problem they were worried about the smell. Fortunately there does not appear to be any signs of active infection  which is great news. Nonetheless I do not think we want to leave this just open to air. 01/13/2021 upon evaluation today patient's wounds actually appear to be about as good as have seen since have been taking care of her. Fortunately there does not appear to be any evidence of infection which is great and overall the biggest issue I see is that of fluid buildup. I think we need to do something to try to help manage this. I think if we can control her edema we get the wounds to heal more effectively. 01/27/2021 upon evaluation today patient appears to be doing well in regard to the wounds on her right lateral malleolus as well as the left heel and the right ischial tuberosity is unfortunately a new area that has arisen since we last saw her. She tells me currently that that is from sitting too much. With that being said this actually appears to be doing the worst of anything so far that I see today. Fortunately there does not appear to be any signs of infection this is at least good news. Compression wrap seem to be doing excellent for her as far as the legs are concerned. 02/03/2021 upon evaluation today patient appears to be doing well with regard to all of her wounds. She has been tolerating the dressing changes without complication. Fortunately there is no signs of active infection at this time. No fevers, chills, nausea, vomiting, or diarrhea. 02/10/2021 upon evaluation today patient appears to be doing well with regard to her wounds. With that being said there is some slight evidence of hypergranulation in regard to the right ankle in particular and a little bit in regard to the left heel. I think Hydrofera Blue might be a better option to go to at this point based on what I am seeing especially since both of these wounds in particular seem to be a little bit stalled. I discussed that with the patient and her daughter today. With regard to the hip this is doing great with the collagen I think will get  very close to complete closure I recommend we continue with the collagen. 02/17/2021 upon evaluation today patient appears to be doing excellent in regard to her heel ulcers. She has been tolerating the dressing changes without complication and overall I am extremely pleased with where things stand today. There does not appear to be any signs of active infection which is great news. Overall I may think that we are headed in the proper direction based on what I am seeing. 02/24/2021 upon evaluation today patient's wounds actually are showing signs of improvement in regard to her heels both are doing awesome. In [place] ([id number]) regard  to her hip location this is actually reopened after being closed last week and to be perfectly honest I am more concerned here about the fact that pressures causing this issue I do not think it has anything to do with her not having the collagen in place again it was completely healed last week there was no reason for the collagen to be there. 03/03/2021 upon evaluation today patient appears to be doing well with regard to her wounds. Everything is showing signs of improvement and doing some much better as far as the overall size of the wound. Fortunately I think we are headed in the right direction. Objective Constitutional Well-nourished and well-hydrated in no acute distress. Vitals Time Taken: 3:13 PM, Height: 66 in, Weight: 76 lbs, BMI: 12.3, Temperature: 98.7 F, Pulse: 66 bpm, Respiratory Rate: 18 breaths/min, Blood Pressure: 159/68 mmHg. Respiratory normal breathing without difficulty. Psychiatric this patient is able to make decisions and demonstrates good insight into disease process. Alert and Oriented x 3. pleasant and cooperative. General Notes: Upon inspection patient's wound bed actually showed signs of good granulation epithelization pretty much across the board. There does not appear to be any evidence of active infection which is great  news and overall I am extremely pleased with where things stand today. No fevers, chills, nausea, vomiting, or diarrhea. Integumentary (Hair, Skin) Wound #1 status is Open. Original cause of wound was Gradually Appeared. The date acquired was: 10/22/2014. The wound has been in treatment 19 weeks. The wound is located on the Right,Lateral Malleolus. The wound measures 0.3cm length x 0.3cm width x 0.1cm depth; 0.071cm^2 area and 0.007cm^3 volume. There is Fat Layer (Subcutaneous Tissue) exposed. There is no tunneling or undermining noted. There is a medium amount of serous drainage noted. The wound margin is flat and intact. There is large (67-100%) red, pink granulation within the wound bed. There is a small (1-33%) amount of necrotic tissue within the wound bed. Wound #2 status is Open. Original cause of wound was Gradually Appeared. The date acquired was: 07/09/2020. The wound has been in treatment 19 weeks. The wound is located on the Left Calcaneus. The wound measures 0.2cm length x 0.3cm width x 0.1cm depth; 0.047cm^2 area and 0.005cm^3 volume. There is Fat Layer (Subcutaneous Tissue) exposed. There is no tunneling or undermining noted. There is a medium amount of serous drainage noted. There is large (67-100%) red, pink granulation within the wound bed. There is a small (1-33%) amount of necrotic tissue within the wound bed including Adherent Slough. Wound #4 status is Open. Original cause of wound was Blister. The date acquired was: 01/16/2021. The wound has been in treatment 5 weeks. The wound is located on the Right Ischial Tuberosity. The wound measures 0.2cm length x 0.2cm width x 0.1cm depth; 0.031cm^2 area and 0.003cm^3 volume. There is Fat Layer (Subcutaneous Tissue) exposed. There is no tunneling or undermining noted. There is a medium amount of serosanguineous drainage noted. There is large (67-100%) pink granulation within the wound bed. There is a small (1-33%) amount of necrotic tissue  within the wound bed including Adherent Slough. Assessment Active Problems ICD-10 Pressure ulcer of right ankle, stage 3 Pressure ulcer of left heel, stage 3 Unspecified open wound of left forearm, initial encounter Pressure ulcer of right buttock, stage 2 Essential (primary) hypertension Atherosclerotic heart disease of native coronary artery without angina pectoris Other specified peripheral vascular diseases [name], [name] ([id number]) Procedures Wound #1 Pre-procedure diagnosis of Wound #1 is a Pressure Ulcer located on  the Right,Lateral Malleolus . There was a Three Layer Compression Therapy Procedure by Elizabeth Poag, RN. Post procedure Diagnosis Wound #1: Same as Pre-Procedure Wound #2 Pre-procedure diagnosis of Wound #2 is a Pressure Ulcer located on the Left Calcaneus . There was a Three Layer Compression Therapy Procedure by Elizabeth Poag, RN. Post procedure Diagnosis Wound #2: Same as Pre-Procedure Plan Follow-up Appointments: Return Appointment in 1 week. Nurse Visit as needed Bathing/ Shower/ Hygiene: May shower with wound dressing protected with water repellent cover or cast protector. No tub bath. Edema Control - Lymphedema / Segmental Compressive Device / Other: Optional: One layer of unna paste to top of compression wrap (to act as an anchor). Elevate legs to the level of the heart and pump ankles as often as possible Elevate leg(s) parallel to the floor when sitting. DO YOUR BEST to sleep in the bed at night. DO NOT sleep in your recliner. Long hours of sitting in a recliner leads to swelling of the legs and/or potential wounds on your backside. Off-Loading: Gel wheelchair cushion - prop when sitting or laying to avoid pressure on your hip wound Turn and reposition every 2 hours WOUND #1: - Malleolus Wound Laterality: Right, Lateral Cleanser: Soap and Water 1 x Per Day/30 Days Discharge Instructions: Gently cleanse wound with antibacterial soap, rinse and pat  dry prior to dressing wounds Primary Dressing: Hydrofera Blue Ready Transfer Foam, 2.5x2.5 (in/in) 1 x Per Day/30 Days Discharge Instructions: Apply Hydrofera Blue Ready to wound bed as directed Secondary Dressing: ABD Pad 5x9 (in/in) 1 x Per Day/30 Days Discharge Instructions: Cover with ABD pad-also pad upper shin with extra ABD pad Compression Wrap: Profore Lite LF 3 Multilayer Compression Bandaging System 1 x Per Day/30 Days Discharge Instructions: Apply 3 multi-layer wrap as prescribed. WOUND #2: - Calcaneus Wound Laterality: Left Cleanser: Soap and Water 1 x Per Day/30 Days Discharge Instructions: Gently cleanse wound with antibacterial soap, rinse and pat dry prior to dressing wounds Primary Dressing: Hydrofera Blue Ready Transfer Foam, 2.5x2.5 (in/in) 1 x Per Day/30 Days Discharge Instructions: Apply Hydrofera Blue Ready to wound bed as directed Secondary Dressing: ABD Pad 5x9 (in/in) 1 x Per Day/30 Days Discharge Instructions: Cover with ABD pad Compression Wrap: Profore Lite LF 3 Multilayer Compression Bandaging System 1 x Per Day/30 Days Discharge Instructions: Apply 3 multi-layer wrap as prescribed. WOUND #4: - Ischial Tuberosity Wound Laterality: Right Cleanser: Soap and Water 3 x Per Week/30 Days Discharge Instructions: Gently cleanse wound with antibacterial soap, rinse and pat dry prior to dressing wounds Primary Dressing: Prisma 4.34 (in) 3 x Per Week/30 Days Discharge Instructions: Moisten w/normal saline or sterile water; Cover wound as directed. Do not remove from wound bed. Secondary Dressing: Mepilex Border Flex, 4x4 (in/in) (Generic) 3 x Per Week/30 Days Discharge Instructions: For protection: Apply to wound as directed. Do not cut. 1. Would recommend that we going continue with the wound care measures as before and the patient is in agreement with the plan. This includes the use of the silver collagen to the hip location followed by bordered foam dressing. 2. I am  also can recommend that we have the patient continue with the Hydrofera Blue to both heel and ankle locations which seem to be doing well with this currently I see no reason to make any changes. 3. We will get a continue with the bilateral compression wrap on the legs 3 layers which I do think is doing a great job for her as well. We will see  patient back for reevaluation in 1 week here in the clinic. If anything worsens or changes patient will contact our office for additional recommendations. [name], [name] ([id number]) Electronic Signature(s) Signed: 03/03/2021 3:51:59 PM By: Elizabeth Keeler PA-C Entered By: [name] on 03/03/2021 15:51:59 [name], [name] ([id number]) -------------------------------------------------------------------------------- SuperBill Details Patient Name: [name], [name] Date of Service: 03/03/2021 Medical Record Number: [id number] Patient Account Number: [id number] Date of Birth/Sex: 07-13-1927 (87 y.o. F) Treating RN: [name] Primary Care Provider: Viviana Downs Other Clinician: Referring Provider: Viviana Downs Treating Provider/Extender: [name] in Treatment: 19 Diagnosis Coding ICD-10 Codes Code Description L89.513 Pressure ulcer of right ankle, stage 3 L89.623 Pressure ulcer of left heel, stage 3 S51.802A Unspecified open wound of left forearm, initial encounter L89.312 Pressure ulcer of right buttock, stage 2 I10 Essential (primary) hypertension I25.10 Atherosclerotic heart disease of native coronary artery without angina pectoris I73.89 Other specified peripheral vascular diseases Facility Procedures CPT4: Description Modifier Quantity Code [id number] BILATERAL: Application of multi-layer venous compression system; leg (below knee), including 1 ankle and foot. Physician Procedures CPT4 Code: [id number] Description: [id number] - WC PHYS LEVEL 4 - EST PT Modifier: Quantity: 1 CPT4 Code: Description: ICD-10 Diagnosis  Description L89.513 Pressure ulcer of right ankle, stage 3 L89.623 Pressure ulcer of left heel, stage 3 S51.802A Unspecified open wound of left forearm, initial encounter L89.312 Pressure ulcer of right buttock, stage 2 Modifier: Quantity: Electronic Signature(s) Signed: 03/03/2021 3:52:19 PM By: Elizabeth Keeler PA-C Entered By: [name] on 03/03/2021 15:52:19

## 2021-03-03 NOTE — Progress Notes (Addendum)
SOMA, GENTLE ([id number]) Visit Report for 03/03/2021 Arrival Information Details Patient Name: [name], [name] Date of Service: 03/03/2021 3:00 PM Medical Record Number: [id number] Patient Account Number: [id number] Date of Birth/Sex: May 02, 1928 (87 y.o. F) Treating RN: Carlene Coria Primary Care Anaijah Augsburger: Viviana Simpler Other Clinician: Referring Benjamine Strout: Viviana Simpler Treating Nadie Fiumara/Extender: Skipper Cliche in Treatment: 63 Visit Information History Since Last Visit All ordered tests and consults were completed: No Patient Arrived: [name] Added or deleted any medications: No Arrival Time: 15:12 Any new allergies or adverse reactions: No Accompanied By: daughter Had a fall or experienced change in No Transfer Assistance: None activities of daily living that may affect Patient Identification Verified: Yes risk of falls: Secondary Verification Process Completed: Yes Signs or symptoms of abuse/neglect since last visito No Patient Has Alerts: Yes Hospitalized since last visit: No Patient Alerts: NOT DIABETIC Implantable device outside of the clinic excluding No TBI left .77 cellular tissue based products placed in the center TBI right .76 since last visit: Has Dressing in Place as Prescribed: Yes Has Compression in Place as Prescribed: Yes Pain Present Now: No Electronic Signature(s) Signed: 03/06/2021 1:47:40 PM By: Carlene Coria RN Entered By: Carlene Coria on 03/03/2021 15:12:57 Sides, [name] ([id number]) -------------------------------------------------------------------------------- Clinic Level of Care Assessment Details Patient Name: [name] Date of Service: 03/03/2021 3:00 PM Medical Record Number: [id number] Patient Account Number: [id number] Date of Birth/Sex: [birth year] (87 y.o. F) Treating RN: Donnamarie Poag Primary Care Elaine Middleton: Viviana Simpler Other Clinician: Referring Savannah Erbe: Viviana Simpler Treating Jamirah Zelaya/Extender: Skipper Cliche  in Treatment: 19 Clinic Level of Care Assessment Items TOOL 1 Quantity Score '[]'$  - Use when EandM and Procedure is performed on INITIAL visit 0 ASSESSMENTS - Nursing Assessment / Reassessment '[]'$  - General Physical Exam (combine w/ comprehensive assessment (listed just below) when performed on new 0 pt. evals) '[]'$  - 0 Comprehensive Assessment (HX, ROS, Risk Assessments, Wounds Hx, etc.) ASSESSMENTS - Wound and Skin Assessment / Reassessment '[]'$  - Dermatologic / Skin Assessment (not related to wound area) 0 ASSESSMENTS - Ostomy and/or Continence Assessment and Care '[]'$  - Incontinence Assessment and Management 0 '[]'$  - 0 Ostomy Care Assessment and Management (repouching, etc.) PROCESS - Coordination of Care '[]'$  - Simple Patient / Family Education for ongoing care 0 '[]'$  - 0 Complex (extensive) Patient / Family Education for ongoing care '[]'$  - 0 Staff obtains Programmer, systems, Records, Test Results / Process Orders '[]'$  - 0 Staff telephones HHA, Nursing Homes / Clarify orders / etc '[]'$  - 0 Routine Transfer to another Facility (non-emergent condition) '[]'$  - 0 Routine Hospital Admission (non-emergent condition) '[]'$  - 0 New Admissions / Biomedical engineer / Ordering NPWT, Apligraf, etc. '[]'$  - 0 Emergency Hospital Admission (emergent condition) PROCESS - Special Needs '[]'$  - Pediatric / Minor Patient Management 0 '[]'$  - 0 Isolation Patient Management '[]'$  - 0 Hearing / Language / Visual special needs '[]'$  - 0 Assessment of Community assistance (transportation, D/C planning, etc.) '[]'$  - 0 Additional assistance / Altered mentation '[]'$  - 0 Support Surface(s) Assessment (bed, cushion, seat, etc.) INTERVENTIONS - Miscellaneous '[]'$  - External ear exam 0 '[]'$  - 0 Patient Transfer (multiple staff / Civil Service fast streamer / Similar devices) '[]'$  - 0 Simple Staple / Suture removal (25 or less) '[]'$  - 0 Complex Staple / Suture removal (26 or more) '[]'$  - 0 Hypo/Hyperglycemic Management (do not check if billed separately) '[]'$  -  0 Ankle / Brachial Index (ABI) - do not check if billed separately Has the patient been seen at  the hospital within the last three years: Yes Total Score: 0 Level Of Care: ____ [name] ([id number]) Electronic Signature(s) Signed: 03/03/2021 4:19:24 PM By: Donnamarie Poag Entered By: Donnamarie Poag on 03/03/2021 15:47:31 Hagins, [name] ([id number]) -------------------------------------------------------------------------------- Compression Therapy Details Patient Name: Elizabeth, Downs Date of Service: 03/03/2021 3:00 PM Medical Record Number: [id number] Patient Account Number: [id number] Date of Birth/Sex: [birth year] (87 y.o. F) Treating RN: Donnamarie Poag Primary Care Medea Deines: Viviana Simpler Other Clinician: Referring Makenzey Nanni: Viviana Simpler Treating Ariv Penrod/Extender: Skipper Cliche in Treatment: 19 Compression Therapy Performed for Wound Assessment: Wound #1 Right,Lateral Malleolus Performed By: Junius Argyle, RN Compression Type: Three Layer Post Procedure Diagnosis Same as Pre-procedure Electronic Signature(s) Signed: 03/03/2021 4:19:24 PM By: Donnamarie Poag Entered By: Donnamarie Poag on 03/03/2021 15:46:04 Twaddell, [name] ([id number]) -------------------------------------------------------------------------------- Compression Therapy Details Patient Name: Elizabeth, Downs Date of Service: 03/03/2021 3:00 PM Medical Record Number: [id number] Patient Account Number: [id number] Date of Birth/Sex: [birth year] (87 y.o. F) Treating RN: Donnamarie Poag Primary Care Larell Baney: Viviana Simpler Other Clinician: Referring Arnel Wymer: Viviana Simpler Treating Azjah Pardo/Extender: Skipper Cliche in Treatment: 19 Compression Therapy Performed for Wound Assessment: Wound #2 Left Calcaneus Performed By: Clinician Donnamarie Poag, RN Compression Type: Three Layer Post Procedure Diagnosis Same as Pre-procedure Electronic Signature(s) Signed: 03/03/2021 4:19:24 PM By: Donnamarie Poag Entered By:  Donnamarie Poag on 03/03/2021 15:46:04 Hirschman, [name] ([id number]) -------------------------------------------------------------------------------- Encounter Discharge Information Details Patient Name: DIERDRA, MAGLIOCCA Date of Service: 03/03/2021 3:00 PM Medical Record Number: [id number] Patient Account Number: [id number] Date of Birth/Sex: [birth year] (87 y.o. F) Treating RN: Donnamarie Poag Primary Care Ilithyia Titzer: Viviana Simpler Other Clinician: Referring Alexarae Oliva: Viviana Simpler Treating Kortney Schoenfelder/Extender: Skipper Cliche in Treatment: 19 Encounter Discharge Information Items Discharge Condition: Stable Ambulatory Status: Walker Discharge Destination: Home Transportation: Private Auto Accompanied By: daughter Schedule Follow-up Appointment: Yes Clinical Summary of Care: Electronic Signature(s) Signed: 03/03/2021 4:19:01 PM By: Donnamarie Poag Entered By: Donnamarie Poag on 03/03/2021 16:19:00 Severe, [name] ([id number]) -------------------------------------------------------------------------------- Lower Extremity Assessment Details Patient Name: PAIGELYN, SHILLINGLAW Date of Service: 03/03/2021 3:00 PM Medical Record Number: [id number] Patient Account Number: [id number] Date of Birth/Sex: 1928-07-09 (87 y.o. F) Treating RN: Donnamarie Poag Primary Care Kayveon Lennartz: Viviana Simpler Other Clinician: Referring Domenic Schoenberger: Viviana Simpler Treating Jody Aguinaga/Extender: Skipper Cliche in Treatment: 19 Edema Assessment Assessed: Shirlyn Goltz: Yes] Patrice Paradise: Yes] Edema: [Left: No] [Right: No] Calf Left: Right: Point of Measurement: 33 cm From Medial Instep 23 cm 22 cm Ankle Left: Right: Point of Measurement: 9 cm From Medial Instep 17 cm 17 cm Vascular Assessment Pulses: Dorsalis Pedis Palpable: [Left:Yes] [Right:Yes] Electronic Signature(s) Signed: 03/03/2021 4:19:24 PM By: Donnamarie Poag Entered By: Donnamarie Poag on 03/03/2021 15:41:26 Ormond, [name]  ([id number]) -------------------------------------------------------------------------------- Multi Wound Chart Details Patient Name: [name] Date of Service: 03/03/2021 3:00 PM Medical Record Number: [id number] Patient Account Number: [id number] Date of Birth/Sex: Sep 29, 1927 (87 y.o. F) Treating RN: Donnamarie Poag Primary Care Dasean Brow: Viviana Simpler Other Clinician: Referring Selig Wampole: Viviana Simpler Treating Sallee Hogrefe/Extender: Skipper Cliche in Treatment: 19 Vital Signs Height(in): 36 Pulse(bpm): 17 Weight(lbs): 100 Blood Pressure(mmHg): 159/68 Body Mass Index(BMI): 12 Temperature(F): 98.7 Respiratory Rate(breaths/min): 18 Photos: Wound Location: Right, Lateral Malleolus Left Calcaneus Right Ischial Tuberosity Wounding Event: Gradually Appeared Gradually Appeared Blister Primary Etiology: Pressure Ulcer Pressure Ulcer Pressure Ulcer Comorbid History: Arrhythmia, Coronary Artery Arrhythmia, Coronary Artery Arrhythmia, Coronary Artery Disease, Hypertension, Peripheral Disease, Hypertension, Peripheral Disease, Hypertension, Peripheral Arterial Disease, History of pressure Arterial Disease, History of pressure Arterial Disease, History of pressure wounds,  Osteoarthritis wounds, Osteoarthritis wounds, Osteoarthritis Date Acquired: 10/22/2014 07/09/2020 01/16/2021 Weeks of Treatment: '19 19 5 '$ Wound Status: Open Open Open Measurements L x W x D (cm) 0.3x0.3x0.1 0.2x0.3x0.1 0.2x0.2x0.1 Area (cm) : 0.071 0.047 0.031 Volume (cm) : 0.007 0.005 0.003 % Reduction in Area: 91.00% 96.90% 99.00% % Reduction in Volume: 91.10% 96.70% 99.00% Classification: Category/Stage III Unstageable/Unclassified Category/Stage III Exudate Amount: Medium Medium Medium Exudate Type: Serous Serous Serosanguineous Exudate Color: amber amber red, brown Wound Margin: Flat and Intact N/A N/A Granulation Amount: Large (67-100%) Large (67-100%) Large (67-100%) Granulation Quality: Red, Pink Red, Pink  Pink Necrotic Amount: Small (1-33%) Small (1-33%) Small (1-33%) Exposed Structures: Fat Layer (Subcutaneous Tissue): Fat Layer (Subcutaneous Tissue): Fat Layer (Subcutaneous Tissue): Yes Yes Yes Fascia: No Fascia: No Fascia: No Tendon: No Tendon: No Tendon: No Muscle: No Muscle: No Muscle: No Joint: No Joint: No Joint: No Bone: No Bone: No Bone: No Epithelialization: Small (1-33%) Small (1-33%) Small (1-33%) Treatment Notes Electronic Signature(s) Signed: 03/03/2021 4:19:24 PM By: Donnamarie Poag Entered By: Donnamarie Poag on 03/03/2021 15:45:45 Ruesch, [name] ([id number]) -------------------------------------------------------------------------------- Multi-Disciplinary Care Plan Details Patient Name: GIDGETT, TORMAN Date of Service: 03/03/2021 3:00 PM Medical Record Number: [id number] Patient Account Number: [id number] Date of Birth/Sex: 1927-10-10 (87 y.o. F) Treating RN: Donnamarie Poag Primary Care Isley Zinni: Viviana Simpler Other Clinician: Referring Zorianna Taliaferro: Viviana Simpler Treating Glorious Flicker/Extender: Skipper Cliche in Treatment: 62 Active Inactive Electronic Signature(s) Signed: 03/03/2021 4:19:24 PM By: Donnamarie Poag Entered By: Donnamarie Poag on 03/03/2021 15:42:00 Helsley, [name] ([id number]) -------------------------------------------------------------------------------- Pain Assessment Details Patient Name: VIVIANN, SANAGUSTIN Date of Service: 03/03/2021 3:00 PM Medical Record Number: [id number] Patient Account Number: [id number] Date of Birth/Sex: 1927-09-11 (87 y.o. F) Treating RN: Carlene Coria Primary Care Monicka Cyran: Viviana Simpler Other Clinician: Referring Mery Guadalupe: Viviana Simpler Treating Tria Noguera/Extender: Skipper Cliche in Treatment: 19 Active Problems Location of Pain Severity and Description of Pain Patient Has Paino No Site Locations Pain Management and Medication Current Pain Management: Electronic Signature(s) Signed: 03/06/2021 1:47:40 PM By: Carlene Coria RN Entered By: Carlene Coria on 03/03/2021 15:13:30 Oesterle, [name] ([id number]) -------------------------------------------------------------------------------- Patient/Caregiver Education Details Patient Name: ALIZZA, BOPP Date of Service: 03/03/2021 3:00 PM Medical Record Number: [id number] Patient Account Number: [id number] Date of Birth/Gender: 01-09-1928 (87 y.o. F) Treating RN: Donnamarie Poag Primary Care Physician: Viviana Simpler Other Clinician: Referring Physician: Viviana Simpler Treating Physician/Extender: Skipper Cliche in Treatment: 72 Education Assessment Education Provided To: Patient and Caregiver Education Topics Provided Basic Hygiene: Offloading: Wound/Skin Impairment: Engineer, maintenance) Signed: 03/03/2021 4:19:24 PM By: Donnamarie Poag Entered By: Donnamarie Poag on 03/03/2021 15:48:14 Hamman, [name] ([id number]) -------------------------------------------------------------------------------- Wound Assessment Details Patient Name: AAMIA, UPTEGROVE Date of Service: 03/03/2021 3:00 PM Medical Record Number: [id number] Patient Account Number: [id number] Date of Birth/Sex: 1928/06/10 (87 y.o. F) Treating RN: Donnamarie Poag Primary Care Capri Veals: Viviana Simpler Other Clinician: Referring Krithik Mapel: Viviana Simpler Treating Jenevieve Kirschbaum/Extender: Skipper Cliche in Treatment: 19 Wound Status Wound Number: 1 Primary Pressure Ulcer Etiology: Wound Location: Right, Lateral Malleolus Wound Open Wounding Event: Gradually Appeared Status: Date Acquired: 10/22/2014 Comorbid Arrhythmia, Coronary Artery Disease, Hypertension, Weeks Of Treatment: 19 History: Peripheral Arterial Disease, History of pressure wounds, Clustered Wound: No Osteoarthritis Photos Wound Measurements Length: (cm) 0.3 Width: (cm) 0.3 Depth: (cm) 0.1 Area: (cm) 0.071 Volume: (cm) 0.007 % Reduction in Area: 91% % Reduction in Volume: 91.1% Epithelialization: Small (1-33%) Tunneling:  No Undermining: No Wound Description Classification: Category/Stage III Wound Margin: Flat and Intact Exudate Amount: Medium Exudate Type: Serous  Exudate Color: amber Foul Odor After Cleansing: No Slough/Fibrino Yes Wound Bed Granulation Amount: Large (67-100%) Exposed Structure Granulation Quality: Red, Pink Fascia Exposed: No Necrotic Amount: Small (1-33%) Fat Layer (Subcutaneous Tissue) Exposed: Yes Tendon Exposed: No Muscle Exposed: No Joint Exposed: No Bone Exposed: No Treatment Notes Wound #1 (Malleolus) Wound Laterality: Right, Lateral Cleanser Soap and Water Discharge Instruction: Gently cleanse wound with antibacterial soap, rinse and pat dry prior to dressing wounds SOLEIL, STOCKFORD ([id number]) Peri-Wound Care Topical Primary Dressing Hydrofera Blue Ready Transfer Foam, 2.5x2.5 (in/in) Discharge Instruction: Apply Hydrofera Blue Ready to wound bed as directed Secondary Dressing ABD Pad 5x9 (in/in) Discharge Instruction: Cover with ABD pad-also pad upper shin with extra ABD pad Secured With Compression Wrap Profore Lite LF 3 Multilayer Compression Bandaging System Discharge Instruction: Apply 3 multi-layer wrap as prescribed. Compression Stockings Add-Ons Electronic Signature(s) Signed: 03/03/2021 4:19:24 PM By: Donnamarie Poag Entered By: Donnamarie Poag on 03/03/2021 15:39:00 Neiswonger, [name] ([id number]) -------------------------------------------------------------------------------- Wound Assessment Details Patient Name: KERAH, NEUGENT Date of Service: 03/03/2021 3:00 PM Medical Record Number: [id number] Patient Account Number: [id number] Date of Birth/Sex: 31-Mar-1928 (87 y.o. F) Treating RN: Donnamarie Poag Primary Care Mohd Clemons: Viviana Simpler Other Clinician: Referring Ariyana Faw: Viviana Simpler Treating Evian Salguero/Extender: Skipper Cliche in Treatment: 19 Wound Status Wound Number: 2 Primary Pressure Ulcer Etiology: Wound Location: Left Calcaneus Wound  Open Wounding Event: Gradually Appeared Status: Date Acquired: 07/09/2020 Comorbid Arrhythmia, Coronary Artery Disease, Hypertension, Weeks Of Treatment: 19 History: Peripheral Arterial Disease, History of pressure wounds, Clustered Wound: No Osteoarthritis Photos Wound Measurements Length: (cm) 0.2 Width: (cm) 0.3 Depth: (cm) 0.1 Area: (cm) 0.047 Volume: (cm) 0.005 % Reduction in Area: 96.9% % Reduction in Volume: 96.7% Epithelialization: Small (1-33%) Tunneling: No Undermining: No Wound Description Classification: Unstageable/Unclassified Exudate Amount: Medium Exudate Type: Serous Exudate Color: amber Foul Odor After Cleansing: No Slough/Fibrino Yes Wound Bed Granulation Amount: Large (67-100%) Exposed Structure Granulation Quality: Red, Pink Fascia Exposed: No Necrotic Amount: Small (1-33%) Fat Layer (Subcutaneous Tissue) Exposed: Yes Necrotic Quality: Adherent Slough Tendon Exposed: No Muscle Exposed: No Joint Exposed: No Bone Exposed: No Treatment Notes Wound #2 (Calcaneus) Wound Laterality: Left Cleanser Soap and Water Discharge Instruction: Gently cleanse wound with antibacterial soap, rinse and pat dry prior to dressing wounds Peri-Wound Care LAESHA, MUMPOWER ([id number]) Topical Primary Dressing Hydrofera Blue Ready Transfer Foam, 2.5x2.5 (in/in) Discharge Instruction: Apply Hydrofera Blue Ready to wound bed as directed Secondary Dressing ABD Pad 5x9 (in/in) Discharge Instruction: Cover with ABD pad Secured With Compression Wrap Profore Lite LF 3 Multilayer Compression Bandaging System Discharge Instruction: Apply 3 multi-layer wrap as prescribed. Compression Stockings Add-Ons Electronic Signature(s) Signed: 03/03/2021 4:19:24 PM By: Donnamarie Poag Entered By: Donnamarie Poag on 03/03/2021 15:39:28 Salley, [name] ([id number]) -------------------------------------------------------------------------------- Wound Assessment Details Patient Name:  LARENDA, FEAST Date of Service: 03/03/2021 3:00 PM Medical Record Number: [id number] Patient Account Number: [id number] Date of Birth/Sex: 1927-10-08 (87 y.o. F) Treating RN: Donnamarie Poag Primary Care Macklyn Glandon: Viviana Simpler Other Clinician: Referring Lex Linhares: Viviana Simpler Treating Harley Mccartney/Extender: Skipper Cliche in Treatment: 19 Wound Status Wound Number: 4 Primary Pressure Ulcer Etiology: Wound Location: Right Ischial Tuberosity Wound Open Wounding Event: Blister Status: Date Acquired: 01/16/2021 Comorbid Arrhythmia, Coronary Artery Disease, Hypertension, Weeks Of Treatment: 5 History: Peripheral Arterial Disease, History of pressure wounds, Clustered Wound: No Osteoarthritis Photos Wound Measurements Length: (cm) 0.2 Width: (cm) 0.2 Depth: (cm) 0.1 Area: (cm) 0.031 Volume: (cm) 0.003 % Reduction in Area: 99% % Reduction in Volume: 99% Epithelialization: Small (1-33%)  Tunneling: No Undermining: No Wound Description Classification: Category/Stage III Exudate Amount: Medium Exudate Type: Serosanguineous Exudate Color: red, brown Foul Odor After Cleansing: No Slough/Fibrino Yes Wound Bed Granulation Amount: Large (67-100%) Exposed Structure Granulation Quality: Pink Fascia Exposed: No Necrotic Amount: Small (1-33%) Fat Layer (Subcutaneous Tissue) Exposed: Yes Necrotic Quality: Adherent Slough Tendon Exposed: No Muscle Exposed: No Joint Exposed: No Bone Exposed: No Treatment Notes Wound #4 (Ischial Tuberosity) Wound Laterality: Right Cleanser Soap and Water Discharge Instruction: Gently cleanse wound with antibacterial soap, rinse and pat dry prior to dressing wounds Peri-Wound Care SHYLIN, VENEZIA ([id number]) Topical Primary Dressing Prisma 4.34 (in) Discharge Instruction: Moisten w/normal saline or sterile water; Cover wound as directed. Do not remove from wound bed. Secondary Dressing Mepilex Border Flex, 4x4 (in/in) Discharge Instruction:  For protection: Apply to wound as directed. Do not cut. Secured With Compression Wrap Compression Stockings Add-Ons Electronic Signature(s) Signed: 03/03/2021 4:19:24 PM By: Donnamarie Poag Entered By: Donnamarie Poag on 03/03/2021 15:40:06 Paar, [name] ([id number]) -------------------------------------------------------------------------------- [place] Details Patient Name: [name] Date of Service: 03/03/2021 3:00 PM Medical Record Number: [id number] Patient Account Number: [id number] Date of Birth/Sex: Jan 22, 1928 (87 y.o. F) Treating RN: Carlene Coria Primary Care Teffany Blaszczyk: Viviana Simpler Other Clinician: Referring Shizuo Biskup: Viviana Simpler Treating Wayde Gopaul/Extender: Skipper Cliche in Treatment: 19 Vital Signs Time Taken: 15:13 Temperature (F): 98.7 Height (in): 66 Pulse (bpm): 66 Weight (lbs): 76 Respiratory Rate (breaths/min): 18 Body Mass Index (BMI): 12.3 Blood Pressure (mmHg): 159/68 Reference Range: 80 - 120 mg / dl Electronic Signature(s) Signed: 03/06/2021 1:47:40 PM By: Carlene Coria RN Entered By: Carlene Coria on 03/03/2021 15:13:20

## 2021-03-06 ENCOUNTER — Other Ambulatory Visit: Payer: Self-pay | Admitting: Internal Medicine

## 2021-03-06 NOTE — Telephone Encounter (Signed)
Name of Medication: Morphine Sulfate Name of [facility]acy: [facility] or Written Date and Quantity: 02-08-21 #90 Last Office Visit and Type: 01-16-21-22 Next Office Visit and Type: 06-05-21 Last Controlled Substance Agreement Date: 09-29-20 Last UDS: 04-27-19

## 2021-03-10 ENCOUNTER — Encounter: Payer: PPO | Attending: Physician Assistant | Admitting: Physician Assistant

## 2021-03-10 ENCOUNTER — Other Ambulatory Visit: Payer: Self-pay

## 2021-03-10 DIAGNOSIS — L89312 Pressure ulcer of right buttock, stage 2: Secondary | ICD-10-CM | POA: Diagnosis not present

## 2021-03-10 DIAGNOSIS — I2510 Atherosclerotic heart disease of native coronary artery without angina pectoris: Secondary | ICD-10-CM | POA: Diagnosis not present

## 2021-03-10 DIAGNOSIS — I739 Peripheral vascular disease, unspecified: Secondary | ICD-10-CM | POA: Insufficient documentation

## 2021-03-10 DIAGNOSIS — L89513 Pressure ulcer of right ankle, stage 3: Secondary | ICD-10-CM | POA: Diagnosis not present

## 2021-03-10 DIAGNOSIS — L89623 Pressure ulcer of left heel, stage 3: Secondary | ICD-10-CM | POA: Insufficient documentation

## 2021-03-10 NOTE — Progress Notes (Addendum)
JULITZA, BRADSHER ([id number]) Visit Report for 03/10/2021 Chief Complaint Document Details Patient Name: [name], [name]. Date of Service: 03/10/2021 3:00 PM Medical Record Number: [id number] Patient Account Number: [id number] Date of Birth/Sex: May 21, 1928 (87 y.o. F) Treating RN: Donnamarie Poag Primary Care Provider: Viviana Simpler Other Clinician: Referring Provider: Viviana Simpler Treating Provider/Extender: Skipper Cliche in Treatment: 20 Information Obtained from: Patient Chief Complaint Pressure ulcer right ankle and left heel and right ischial/buttock pressure ulcer Electronic Signature(s) Signed: 03/10/2021 2:42:50 PM By: Worthy Keeler PA-C Entered By: Worthy Keeler on 03/10/2021 14:42:50 Schillaci, [name] ([id number]) -------------------------------------------------------------------------------- HPI Details Patient Name: [name], [name] Date of Service: 03/10/2021 3:00 PM Medical Record Number: [id number] Patient Account Number: [id number] Date of Birth/Sex: [birth year] (87 y.o. F) Treating RN: Donnamarie Poag Primary Care Provider: Viviana Simpler Other Clinician: Referring Provider: Viviana Simpler Treating Provider/Extender: Skipper Cliche in Treatment: 20 History of Present Illness HPI Description: 10/21/2020 upon evaluation today patient presents for initial inspection here in our clinic concerning issues that she has been having quite some time in regard to her ankle and since she has been in the hospital with regard to the heel. The ankle in fact has been 5-6 years at least I am told. With that being said the heel ulcer occurred when she was in the hospital in December for hip surgery when she fractured her hip. She also was in the hospital Tuesday for altered mental status. Really there was nothing that was identified as the cause for this. She did have a. Fortunately there does not appear to be any signs of infection she tells me that she has had she believes arterial  studies At [facility] clinic I could not find Those studies at this point. Nonetheless I will continue to look and see what I can find before Then. The patient also has a skin tear on her arm this occurred more recently when she bumped this at home. The patient does have a history of hypertension, coronary artery disease, and peripheral vascular disease stated. 10/28/2020 I was able to find the patient's chart currently which shows that she did have an arterial study performed in May 2019. This showed that she had a abnormal right toe brachial index and a normal left toe brachial index. She was noncompressible as far as ABIs were concerned. She did appear to have triphasic flow at that time. Unfortunately the wound that is commented on in the report that I printed off and read mentions the same wound on the ankle that we are still dealing with at this point. Unfortunately this has not healed. And its been quite sometime about 3 years now. Fortunately there does not appear to be any signs of active infection systemically at this point. I think that the patient has done well with the Santyl over the past week which is good news. Patient's caregiver which is her daughter-in-law is concerned about the fact that she really does not feel qualified to be able to change the dressings and take care of this issue. There does not appear to be any signs of anything untoward going on at this point. I think she is done a great job applying the [employer] and I think that has done a great job for the patient is for soften up some of the necrotic tissue. With that being said I think the Xeroform on the arm also has done excellent. In general I am very pleased with where we stand. And I told the patient's  daughter-in- law as well that I also feel like she has done a great job over the past week taking care of her mother. 11/11/2020 upon evaluation today patient appears to be doing well with regard to her wounds. She has been  tolerating the dressing changes without complication her daughters been applying the Santyl which has done a great job. Ho with that being said I think now that we have good arterial study showing we can go ahead and proceed with sharp debridement at this point. 11/25/2020 upon evaluation today patient appears to be doing well with regard to her wounds. The Santyl has really helped to clean things up and overall she is doing quite excellent at this point. Fortunately there does not appear to be any signs of infection which is great news and in general extremely pleased. I do believe some debridement is in order and hopefully will be able to get her into a collagen dressing after this. 12/23/2020 upon evaluation today patient appears to be doing well with regard to her wound all things considered. Unfortunately it does sound like her daughter decided to let this air out because she felt like it was getting so wet. It sounds like she got border gauze dressings instead of border foam therefore there is really Nothing to catch the excess drainage which I think is the problem they were worried about the smell. Fortunately there does not appear to be any signs of active infection which is great news. Nonetheless I do not think we want to leave this just open to air. 01/13/2021 upon evaluation today patient's wounds actually appear to be about as good as have seen since have been taking care of her. Fortunately there does not appear to be any evidence of infection which is great and overall the biggest issue I see is that of fluid buildup. I think we need to do something to try to help manage this. I think if we can control her edema we get the wounds to heal more effectively. 01/27/2021 upon evaluation today patient appears to be doing well in regard to the wounds on her right lateral malleolus as well as the left heel and the right ischial tuberosity is unfortunately a new area that has arisen since we last saw  her. She tells me currently that that is from sitting too much. With that being said this actually appears to be doing the worst of anything so far that I see today. Fortunately there does not appear to be any signs of infection this is at least good news. Compression wrap seem to be doing excellent for her as far as the legs are concerned. 02/03/2021 upon evaluation today patient appears to be doing well with regard to all of her wounds. She has been tolerating the dressing changes without complication. Fortunately there is no signs of active infection at this time. No fevers, chills, nausea, vomiting, or diarrhea. 02/10/2021 upon evaluation today patient appears to be doing well with regard to her wounds. With that being said there is some slight evidence of hypergranulation in regard to the right ankle in particular and a little bit in regard to the left heel. I think Hydrofera Blue might be a better option to go to at this point based on what I am seeing especially since both of these wounds in particular seem to be a little bit stalled. I discussed that with the patient and her daughter today. With regard to the hip this is doing great with the  collagen I think will get very close to complete closure I recommend we continue with the collagen. 02/17/2021 upon evaluation today patient appears to be doing excellent in regard to her heel ulcers. She has been tolerating the dressing changes without complication and overall I am extremely pleased with where things stand today. There does not appear to be any signs of active infection which is great news. Overall I may think that we are headed in the proper direction based on what I am seeing. 02/24/2021 upon evaluation today patient's wounds actually are showing signs of improvement in regard to her heels both are doing awesome. In regard to her hip location this is actually reopened after being closed last week and to be perfectly honest I am more concerned  here about the fact that pressures causing this issue I do not think it has anything to do with her not having the collagen in place again it was completely healed last week there was no reason for the collagen to be there. 03/03/2021 upon evaluation today patient appears to be doing well with regard to her wounds. Everything is showing signs of improvement and [name], [name]. ([id number]) doing some much better as far as the overall size of the wound. Fortunately I think we are headed in the right direction. 03/10/2021 upon evaluation today patient appears to be doing well with regard to her wound. She is tolerating the dressing changes without complication. The left heel pretty much appears to be almost completely healed although I think it is probably can be 1 more week before I can call this 100% slough. The right ankle is significantly improved with that being said its not completely closed. With regard to the right hip this is completely closed. Overall I am very pleased with where things stand I think were getting very close to complete resolution. I discussed all this with the patient and her daughter-in-law today. Electronic Signature(s) Signed: 03/10/2021 3:20:08 PM By: Worthy Keeler PA-C Entered By: Worthy Keeler on 03/10/2021 15:20:08 [name] ([id number]) -------------------------------------------------------------------------------- Physical Exam Details Patient Name: [name], [name] Date of Service: 03/10/2021 3:00 PM Medical Record Number: [id number] Patient Account Number: [id number] Date of Birth/Sex: Feb 02, 1928 (87 y.o. F) Treating RN: Donnamarie Poag Primary Care Provider: Viviana Simpler Other Clinician: Referring Provider: Viviana Simpler Treating Provider/Extender: Skipper Cliche in Treatment: 14 Constitutional Well-nourished and well-hydrated in no acute distress. Respiratory normal breathing without difficulty. Psychiatric this patient is able to make  decisions and demonstrates good insight into disease process. Alert and Oriented x 3. pleasant and cooperative. Notes Upon inspection patient's wound bed showed signs of good granulation epithelization at this point. Fortunately there does not appear to be any evidence of active infection which is great news and overall I am extremely pleased with where things stand. No fevers, chills, nausea, vomiting, or diarrhea. Electronic Signature(s) Signed: 03/10/2021 3:20:23 PM By: Worthy Keeler PA-C Entered By: Worthy Keeler on 03/10/2021 15:20:22 [name] ([id number]) -------------------------------------------------------------------------------- Physician Orders Details Patient Name: [name], [name] Date of Service: 03/10/2021 3:00 PM Medical Record Number: [id number] Patient Account Number: [id number] Date of Birth/Sex: 15-Feb-1928 (87 y.o. F) Treating RN: Donnamarie Poag Primary Care Provider: Viviana Simpler Other Clinician: Referring Provider: Viviana Simpler Treating Provider/Extender: Skipper Cliche in Treatment: 20 Verbal / Phone Orders: No Diagnosis Coding ICD-10 Coding Code Description L89.513 Pressure ulcer of right ankle, stage 3 L89.623 Pressure ulcer of left heel, stage 3 S51.802A Unspecified open wound of left forearm,  initial encounter L89.312 Pressure ulcer of right buttock, stage 2 I10 Essential (primary) hypertension I25.10 Atherosclerotic heart disease of native coronary artery without angina pectoris I73.89 Other specified peripheral vascular diseases Follow-up Appointments o Return Appointment in 1 week. o Nurse Visit as needed Bathing/ Shower/ Hygiene o May shower with wound dressing protected with water repellent cover or cast protector. o No tub bath. Edema Control - Lymphedema / Segmental Compressive Device / Other Bilateral Lower Extremities o Optional: One layer of unna paste to top of compression wrap (to act as an anchor). o Elevate legs  to the level of the heart and pump ankles as often as possible o Elevate leg(s) parallel to the floor when sitting. o DO YOUR BEST to sleep in the bed at night. DO NOT sleep in your recliner. Long hours of sitting in a recliner leads to swelling of the legs and/or potential wounds on your backside. Off-Loading o Gel wheelchair cushion - prop when sitting or laying to avoid pressure on your hip wound o Turn and reposition every 2 hours o Other: - Border foam dressing for protection to right hip Wound Treatment Wound #1 - Malleolus Wound Laterality: Right, Lateral Cleanser: Soap and Water 1 x Per Day/30 Days Discharge Instructions: Gently cleanse wound with antibacterial soap, rinse and pat dry prior to dressing wounds Primary Dressing: Hydrofera Blue Ready Transfer Foam, 2.5x2.5 (in/in) 1 x Per Day/30 Days Discharge Instructions: Apply Hydrofera Blue Ready to wound bed as directed Secondary Dressing: ABD Pad 5x9 (in/in) 1 x Per Day/30 Days Discharge Instructions: Cover with ABD pad-also pad upper shin with extra ABD pad Compression Wrap: Profore Lite LF 3 Multilayer Compression Bandaging System 1 x Per Day/30 Days Discharge Instructions: Apply 3 multi-layer wrap as prescribed. Wound #2 - Calcaneus Wound Laterality: Left Cleanser: Soap and Water 1 x Per Day/30 Days Discharge Instructions: Gently cleanse wound with antibacterial soap, rinse and pat dry prior to dressing wounds Primary Dressing: Hydrofera Blue Ready Transfer Foam, 2.5x2.5 (in/in) 1 x Per Day/30 Days Discharge Instructions: Apply Hydrofera Blue Ready to wound bed as directed Secondary Dressing: ABD Pad 5x9 (in/in) 1 x Per Day/30 Days [name], [name] ([id number]) Discharge Instructions: Cover with ABD pad Compression Wrap: Profore Lite LF 3 Multilayer Compression Bandaging System 1 x Per Day/30 Days Discharge Instructions: Apply 3 multi-layer wrap as prescribed. Electronic Signature(s) Signed: 03/10/2021 4:14:19 PM By:  Donnamarie Poag Signed: 03/10/2021 4:48:27 PM By: Worthy Keeler PA-C Entered By: Donnamarie Poag on 03/10/2021 15:16:43 [name], [name] ([id number]) -------------------------------------------------------------------------------- Problem List Details Patient Name: [name], [name] Date of Service: 03/10/2021 3:00 PM Medical Record Number: [id number] Patient Account Number: [id number] Date of Birth/Sex: 10-21-1927 (87 y.o. F) Treating RN: Donnamarie Poag Primary Care Provider: Viviana Simpler Other Clinician: Referring Provider: Viviana Simpler Treating Provider/Extender: Skipper Cliche in Treatment: 20 Active Problems ICD-10 Encounter Code Description Active Date MDM Diagnosis L89.513 Pressure ulcer of right ankle, stage 3 10/21/2020 No Yes L89.623 Pressure ulcer of left heel, stage 3 10/21/2020 No Yes S51.802A Unspecified open wound of left forearm, initial encounter 10/21/2020 No Yes L89.312 Pressure ulcer of right buttock, stage 2 01/27/2021 No Yes I10 Essential (primary) hypertension 10/21/2020 No Yes I25.10 Atherosclerotic heart disease of native coronary artery without angina 10/21/2020 No Yes pectoris I73.89 Other specified peripheral vascular diseases 10/21/2020 No Yes Inactive Problems Resolved Problems Electronic Signature(s) Signed: 03/10/2021 2:42:45 PM By: Worthy Keeler PA-C Entered By: Worthy Keeler on 03/10/2021 14:42:44 [name], [name] ([id number]) -------------------------------------------------------------------------------- Progress Note Details  Patient Name: [name], [name]. Date of Service: 03/10/2021 3:00 PM Medical Record Number: [id number] Patient Account Number: [id number] Date of Birth/Sex: 1927/07/19 (87 y.o. F) Treating RN: Donnamarie Poag Primary Care Provider: Viviana Simpler Other Clinician: Referring Provider: Viviana Simpler Treating Provider/Extender: Skipper Cliche in Treatment: 20 Subjective Chief Complaint Information obtained from Patient Pressure ulcer  right ankle and left heel and right ischial/buttock pressure ulcer History of Present Illness (HPI) 10/21/2020 upon evaluation today patient presents for initial inspection here in our clinic concerning issues that she has been having quite some time in regard to her ankle and since she has been in the hospital with regard to the heel. The ankle in fact has been 5-6 years at least I am told. With that being said the heel ulcer occurred when she was in the hospital in December for hip surgery when she fractured her hip. She also was in the hospital Tuesday for altered mental status. Really there was nothing that was identified as the cause for this. She did have a. Fortunately there does not appear to be any signs of infection she tells me that she has had she believes arterial studies At [facility] clinic I could not find Those studies at this point. Nonetheless I will continue to look and see what I can find before Then. The patient also has a skin tear on her arm this occurred more recently when she bumped this at home. The patient does have a history of hypertension, coronary artery disease, and peripheral vascular disease stated. 10/28/2020 I was able to find the patient's chart currently which shows that she did have an arterial study performed in May 2019. This showed that she had a abnormal right toe brachial index and a normal left toe brachial index. She was noncompressible as far as ABIs were concerned. She did appear to have triphasic flow at that time. Unfortunately the wound that is commented on in the report that I printed off and read mentions the same wound on the ankle that we are still dealing with at this point. Unfortunately this has not healed. And its been quite sometime about 3 years now. Fortunately there does not appear to be any signs of active infection systemically at this point. I think that the patient has done well with the Santyl over the past week which is good  news. Patient's caregiver which is her daughter-in-law is concerned about the fact that she really does not feel qualified to be able to change the dressings and take care of this issue. There does not appear to be any signs of anything untoward going on at this point. I think she is done a great job applying the [employer] and I think that has done a great job for the patient is for soften up some of the necrotic tissue. With that being said I think the Xeroform on the arm also has done excellent. In general I am very pleased with where we stand. And I told the patient's daughter-in- law as well that I also feel like she has done a great job over the past week taking care of her mother. 11/11/2020 upon evaluation today patient appears to be doing well with regard to her wounds. She has been tolerating the dressing changes without complication her daughters been applying the Santyl which has done a great job. Ho with that being said I think now that we have good arterial study showing we can go ahead and proceed with sharp debridement at this  point. 11/25/2020 upon evaluation today patient appears to be doing well with regard to her wounds. The Santyl has really helped to clean things up and overall she is doing quite excellent at this point. Fortunately there does not appear to be any signs of infection which is great news and in general extremely pleased. I do believe some debridement is in order and hopefully will be able to get her into a collagen dressing after this. 12/23/2020 upon evaluation today patient appears to be doing well with regard to her wound all things considered. Unfortunately it does sound like her daughter decided to let this air out because she felt like it was getting so wet. It sounds like she got border gauze dressings instead of border foam therefore there is really Nothing to catch the excess drainage which I think is the problem they were worried about the smell. Fortunately  there does not appear to be any signs of active infection which is great news. Nonetheless I do not think we want to leave this just open to air. 01/13/2021 upon evaluation today patient's wounds actually appear to be about as good as have seen since have been taking care of her. Fortunately there does not appear to be any evidence of infection which is great and overall the biggest issue I see is that of fluid buildup. I think we need to do something to try to help manage this. I think if we can control her edema we get the wounds to heal more effectively. 01/27/2021 upon evaluation today patient appears to be doing well in regard to the wounds on her right lateral malleolus as well as the left heel and the right ischial tuberosity is unfortunately a new area that has arisen since we last saw her. She tells me currently that that is from sitting too much. With that being said this actually appears to be doing the worst of anything so far that I see today. Fortunately there does not appear to be any signs of infection this is at least good news. Compression wrap seem to be doing excellent for her as far as the legs are concerned. 02/03/2021 upon evaluation today patient appears to be doing well with regard to all of her wounds. She has been tolerating the dressing changes without complication. Fortunately there is no signs of active infection at this time. No fevers, chills, nausea, vomiting, or diarrhea. 02/10/2021 upon evaluation today patient appears to be doing well with regard to her wounds. With that being said there is some slight evidence of hypergranulation in regard to the right ankle in particular and a little bit in regard to the left heel. I think Hydrofera Blue might be a better option to go to at this point based on what I am seeing especially since both of these wounds in particular seem to be a little bit stalled. I discussed that with the patient and her daughter today. With regard to the  hip this is doing great with the collagen I think will get very close to complete closure I recommend we continue with the collagen. 02/17/2021 upon evaluation today patient appears to be doing excellent in regard to her heel ulcers. She has been tolerating the dressing changes without complication and overall I am extremely pleased with where things stand today. There does not appear to be any signs of active infection which is great news. Overall I may think that we are headed in the proper direction based on what I am seeing.  02/24/2021 upon evaluation today patient's wounds actually are showing signs of improvement in regard to her heels both are doing awesome. In [place], [place] ([id number]) regard to her hip location this is actually reopened after being closed last week and to be perfectly honest I am more concerned here about the fact that pressures causing this issue I do not think it has anything to do with her not having the collagen in place again it was completely healed last week there was no reason for the collagen to be there. 03/03/2021 upon evaluation today patient appears to be doing well with regard to her wounds. Everything is showing signs of improvement and doing some much better as far as the overall size of the wound. Fortunately I think we are headed in the right direction. 03/10/2021 upon evaluation today patient appears to be doing well with regard to her wound. She is tolerating the dressing changes without complication. The left heel pretty much appears to be almost completely healed although I think it is probably can be 1 more week before I can call this 100% slough. The right ankle is significantly improved with that being said its not completely closed. With regard to the right hip this is completely closed. Overall I am very pleased with where things stand I think were getting very close to complete resolution. I discussed all this with the patient and her  daughter-in-law today. Objective Constitutional Well-nourished and well-hydrated in no acute distress. Vitals Time Taken: 2:52 PM, Height: 66 in, Weight: 76 lbs, BMI: 12.3, Temperature: 98.3 F, Pulse: 70 bpm, Respiratory Rate: 16 breaths/min, Blood Pressure: 154/70 mmHg. Respiratory normal breathing without difficulty. Psychiatric this patient is able to make decisions and demonstrates good insight into disease process. Alert and Oriented x 3. pleasant and cooperative. General Notes: Upon inspection patient's wound bed showed signs of good granulation epithelization at this point. Fortunately there does not appear to be any evidence of active infection which is great news and overall I am extremely pleased with where things stand. No fevers, chills, nausea, vomiting, or diarrhea. Integumentary (Hair, Skin) Wound #1 status is Open. Original cause of wound was Gradually Appeared. The date acquired was: 10/22/2014. The wound has been in treatment 20 weeks. The wound is located on the Right,Lateral Malleolus. The wound measures 0.2cm length x 0.3cm width x 0.1cm depth; 0.047cm^2 area and 0.005cm^3 volume. There is Fat Layer (Subcutaneous Tissue) exposed. There is a medium amount of serous drainage noted. The wound margin is flat and intact. There is large (67-100%) red, pink granulation within the wound bed. There is a small (1-33%) amount of necrotic tissue within the wound bed including Adherent Slough. Wound #2 status is Open. Original cause of wound was Gradually Appeared. The date acquired was: 07/09/2020. The wound has been in treatment 20 weeks. The wound is located on the Left Calcaneus. The wound measures 0.1cm length x 0.1cm width x 0.1cm depth; 0.008cm^2 area and 0.001cm^3 volume. There is Fat Layer (Subcutaneous Tissue) exposed. There is a medium amount of serous drainage noted. There is large (67- 100%) pink, pale granulation within the wound bed. There is no necrotic tissue within the  wound bed. Wound #4 status is Healed - Epithelialized. Original cause of wound was Blister. The date acquired was: 01/16/2021. The wound has been in treatment 6 weeks. The wound is located on the Right Ischial Tuberosity. The wound measures 0cm length x 0cm width x 0cm depth; 0cm^2 area and 0cm^3 volume. There is a none  present amount of drainage noted. There is no granulation within the wound bed. There is no necrotic tissue within the wound bed. Assessment Active Problems ICD-10 Pressure ulcer of right ankle, stage 3 Pressure ulcer of left heel, stage 3 Unspecified open wound of left forearm, initial encounter Pressure ulcer of right buttock, stage 2 Essential (primary) hypertension Atherosclerotic heart disease of native coronary artery without angina pectoris Other specified peripheral vascular diseases [name], [name] ([id number]) Procedures Wound #1 Pre-procedure diagnosis of Wound #1 is a Pressure Ulcer located on the Right,Lateral Malleolus . There was a Three Layer Compression Therapy Procedure by Donnamarie Poag, RN. Post procedure Diagnosis Wound #1: Same as Pre-Procedure Wound #2 Pre-procedure diagnosis of Wound #2 is a Pressure Ulcer located on the Left Calcaneus . There was a Three Layer Compression Therapy Procedure by Donnamarie Poag, RN. Post procedure Diagnosis Wound #2: Same as Pre-Procedure Plan Follow-up Appointments: Return Appointment in 1 week. Nurse Visit as needed Bathing/ Shower/ Hygiene: May shower with wound dressing protected with water repellent cover or cast protector. No tub bath. Edema Control - Lymphedema / Segmental Compressive Device / Other: Optional: One layer of unna paste to top of compression wrap (to act as an anchor). Elevate legs to the level of the heart and pump ankles as often as possible Elevate leg(s) parallel to the floor when sitting. DO YOUR BEST to sleep in the bed at night. DO NOT sleep in your recliner. Long hours of sitting in a  recliner leads to swelling of the legs and/or potential wounds on your backside. Off-Loading: Gel wheelchair cushion - prop when sitting or laying to avoid pressure on your hip wound Turn and reposition every 2 hours Other: - Border foam dressing for protection to right hip WOUND #1: - Malleolus Wound Laterality: Right, Lateral Cleanser: Soap and Water 1 x Per Day/30 Days Discharge Instructions: Gently cleanse wound with antibacterial soap, rinse and pat dry prior to dressing wounds Primary Dressing: Hydrofera Blue Ready Transfer Foam, 2.5x2.5 (in/in) 1 x Per Day/30 Days Discharge Instructions: Apply Hydrofera Blue Ready to wound bed as directed Secondary Dressing: ABD Pad 5x9 (in/in) 1 x Per Day/30 Days Discharge Instructions: Cover with ABD pad-also pad upper shin with extra ABD pad Compression Wrap: Profore Lite LF 3 Multilayer Compression Bandaging System 1 x Per Day/30 Days Discharge Instructions: Apply 3 multi-layer wrap as prescribed. WOUND #2: - Calcaneus Wound Laterality: Left Cleanser: Soap and Water 1 x Per Day/30 Days Discharge Instructions: Gently cleanse wound with antibacterial soap, rinse and pat dry prior to dressing wounds Primary Dressing: Hydrofera Blue Ready Transfer Foam, 2.5x2.5 (in/in) 1 x Per Day/30 Days Discharge Instructions: Apply Hydrofera Blue Ready to wound bed as directed Secondary Dressing: ABD Pad 5x9 (in/in) 1 x Per Day/30 Days Discharge Instructions: Cover with ABD pad Compression Wrap: Profore Lite LF 3 Multilayer Compression Bandaging System 1 x Per Day/30 Days Discharge Instructions: Apply 3 multi-layer wrap as prescribed. 1. Would recommend currently that we going continue with the wound care measures as before and the patient is in agreement with the plan. This includes the use of the [facility] dressing which I think is doing a great job. 2. I am also can recommend that we have the patient continue with the protective dressing for the right  hip location. I do not think we need to actually have any medication on this. It is completely healed. 3. I Minna recommend that we continue with the 3 layer compression wrap for hopefully will be  able to discontinue this next week. We will see patient back for reevaluation in 1 week here in the clinic. If anything worsens or changes patient will contact our office for additional recommendations. Electronic Signature(s) Signed: 03/10/2021 3:23:08 PM By: Worthy Keeler PA-C Entered By: Worthy Keeler on 03/10/2021 15:23:08 [name] ([name], [name] ([id number]) -------------------------------------------------------------------------------- SuperBill Details Patient Name: KIEYA, SANDOR Date of Service: 03/10/2021 Medical Record Number: [id number] Patient Account Number: [id number] Date of Birth/Sex: 1927/11/04 (87 y.o. F) Treating RN: Donnamarie Poag Primary Care Provider: Viviana Simpler Other Clinician: Referring Provider: Viviana Simpler Treating Provider/Extender: Skipper Cliche in Treatment: 20 Diagnosis Coding ICD-10 Codes Code Description [phone] Pressure ulcer of right ankle, stage 3 L89.623 Pressure ulcer of left heel, stage 3 S51.802A Unspecified open wound of left forearm, initial encounter L89.312 Pressure ulcer of right buttock, stage 2 I10 Essential (primary) hypertension I25.10 Atherosclerotic heart disease of native coronary artery without angina pectoris I73.89 Other specified peripheral vascular diseases Facility Procedures CPT4: Description Modifier Quantity Code [id number] BILATERAL: Application of multi-layer venous compression system; leg (below knee), including 1 ankle and foot. Physician Procedures CPT4 Code: [id number] Description: [id number] - WC PHYS LEVEL 3 - EST PT Modifier: Quantity: 1 CPT4 Code: Description: ICD-10 Diagnosis Description L89.513 Pressure ulcer of right ankle, stage 3 L89.623 Pressure ulcer of left heel, stage 3  S51.802A Unspecified open wound of left forearm, initial encounter L89.312 Pressure ulcer of right buttock, stage 2 Modifier: Quantity: Electronic Signature(s) Signed: 03/10/2021 3:23:32 PM By: Worthy Keeler PA-C Entered By: Worthy Keeler on 03/10/2021 15:23:31

## 2021-03-10 NOTE — Progress Notes (Addendum)
LENNIX, LAMINACK ([id number]) Visit Report for 03/10/2021 Arrival Information Details Patient Name: [name], [name] Date of Service: 03/10/2021 3:00 PM Medical Record Number: [id number] Patient Account Number: [id number] Date of Birth/Sex: Nov 15, 1927 (87 y.o. F) Treating RN: Donnamarie Poag Primary Care Jamal Haskin: Viviana Simpler Other Clinician: Referring Zailey Audia: Viviana Simpler Treating Jamara Vary/Extender: Skipper Cliche in Treatment: 20 Visit Information History Since Last Visit Added or deleted any medications: No Patient Arrived: [name] Had a fall or experienced change in No Arrival Time: 14:49 activities of daily living that may affect Accompanied By: daughter risk of falls: Transfer Assistance: None Hospitalized since last visit: No Patient Has Alerts: Yes Has Dressing in Place as Prescribed: Yes Patient Alerts: NOT DIABETIC Pain Present Now: No TBI left .77 TBI right .76 Electronic Signature(s) Signed: 03/10/2021 4:14:19 PM By: Donnamarie Poag Entered By: Donnamarie Poag on 03/10/2021 14:52:17 Brossman, [name] ([id number]) -------------------------------------------------------------------------------- Clinic Level of Care Assessment Details Patient Name: TANLEY, CANNONE Date of Service: 03/10/2021 3:00 PM Medical Record Number: [id number] Patient Account Number: [id number] Date of Birth/Sex: 13-Mar-1928 (87 y.o. F) Treating RN: Donnamarie Poag Primary Care Charlestine Rookstool: Viviana Simpler Other Clinician: Referring Stefano Trulson: Viviana Simpler Treating Tamica Covell/Extender: Skipper Cliche in Treatment: 20 Clinic Level of Care Assessment Items TOOL 1 Quantity Score '[]'$  - Use when EandM and Procedure is performed on INITIAL visit 0 ASSESSMENTS - Nursing Assessment / Reassessment '[]'$  - General Physical Exam (combine w/ comprehensive assessment (listed just below) when performed on new 0 pt. evals) '[]'$  - 0 Comprehensive Assessment (HX, ROS, Risk Assessments, Wounds Hx, etc.) ASSESSMENTS - Wound and  Skin Assessment / Reassessment '[]'$  - Dermatologic / Skin Assessment (not related to wound area) 0 ASSESSMENTS - Ostomy and/or Continence Assessment and Care '[]'$  - Incontinence Assessment and Management 0 '[]'$  - 0 Ostomy Care Assessment and Management (repouching, etc.) PROCESS - Coordination of Care '[]'$  - Simple Patient / Family Education for ongoing care 0 '[]'$  - 0 Complex (extensive) Patient / Family Education for ongoing care '[]'$  - 0 Staff obtains Programmer, systems, Records, Test Results / Process Orders '[]'$  - 0 Staff telephones HHA, Nursing Homes / Clarify orders / etc '[]'$  - 0 Routine Transfer to another Facility (non-emergent condition) '[]'$  - 0 Routine Hospital Admission (non-emergent condition) '[]'$  - 0 New Admissions / Biomedical engineer / Ordering NPWT, Apligraf, etc. '[]'$  - 0 Emergency Hospital Admission (emergent condition) PROCESS - Special Needs '[]'$  - Pediatric / Minor Patient Management 0 '[]'$  - 0 Isolation Patient Management '[]'$  - 0 Hearing / Language / Visual special needs '[]'$  - 0 Assessment of Community assistance (transportation, D/C planning, etc.) '[]'$  - 0 Additional assistance / Altered mentation '[]'$  - 0 Support Surface(s) Assessment (bed, cushion, seat, etc.) INTERVENTIONS - Miscellaneous '[]'$  - External ear exam 0 '[]'$  - 0 Patient Transfer (multiple staff / Civil Service fast streamer / Similar devices) '[]'$  - 0 Simple Staple / Suture removal (25 or less) '[]'$  - 0 Complex Staple / Suture removal (26 or more) '[]'$  - 0 Hypo/Hyperglycemic Management (do not check if billed separately) '[]'$  - 0 Ankle / Brachial Index (ABI) - do not check if billed separately Has the patient been seen at the hospital within the last three years: Yes Total Score: 0 Level Of Care: ____ [name] ([id number]) Electronic Signature(s) Signed: 03/10/2021 4:14:19 PM By: Donnamarie Poag Entered By: Donnamarie Poag on 03/10/2021 15:17:16 [place]mond, [name]  ([id number]) -------------------------------------------------------------------------------- Compression Therapy Details Patient Name: NATAJHA, PINTADO Date of Service: 03/10/2021 3:00 PM Medical Record Number: [id number] Patient Account  Number: [id number] Date of Birth/Sex: June 10, 1928 (87 y.o. F) Treating RN: Donnamarie Poag Primary Care Arien Benincasa: Viviana Simpler Other Clinician: Referring Yobana Culliton: Viviana Simpler Treating Torria Fromer/Extender: Skipper Cliche in Treatment: 20 Compression Therapy Performed for Wound Assessment: Wound #2 Left Calcaneus Performed By: Clinician Donnamarie Poag, RN Compression Type: Three Layer Post Procedure Diagnosis Same as Pre-procedure Electronic Signature(s) Signed: 03/10/2021 4:14:19 PM By: Donnamarie Poag Entered By: Donnamarie Poag on 03/10/2021 15:17:08 Epting, [name] ([id number]) -------------------------------------------------------------------------------- Compression Therapy Details Patient Name: LATAYSHA, ZEIN Date of Service: 03/10/2021 3:00 PM Medical Record Number: [id number] Patient Account Number: [id number] Date of Birth/Sex: 07/19/1927 (87 y.o. F) Treating RN: Donnamarie Poag Primary Care Kitt Minardi: Viviana Simpler Other Clinician: Referring Uthman Mroczkowski: Viviana Simpler Treating Dontavious Emily/Extender: Skipper Cliche in Treatment: 20 Compression Therapy Performed for Wound Assessment: Wound #1 Right,Lateral Malleolus Performed By: Junius Argyle, RN Compression Type: Three Layer Post Procedure Diagnosis Same as Pre-procedure Electronic Signature(s) Signed: 03/10/2021 4:14:19 PM By: Donnamarie Poag Entered By: Donnamarie Poag on 03/10/2021 15:17:08 Klasen, [name] ([id number]) -------------------------------------------------------------------------------- Encounter Discharge Information Details Patient Name: ROMA, LUSSIER Date of Service: 03/10/2021 3:00 PM Medical Record Number: [id number] Patient Account Number: [id number] Date of Birth/Sex: 04-27-1928  (87 y.o. F) Treating RN: Donnamarie Poag Primary Care Ayad Nieman: Viviana Simpler Other Clinician: Referring Chaseton Yepiz: Viviana Simpler Treating Tavius Turgeon/Extender: Skipper Cliche in Treatment: 20 Encounter Discharge Information Items Discharge Condition: Stable Ambulatory Status: Walker Discharge Destination: Home Transportation: Private Auto Accompanied By: daughter in law Schedule Follow-up Appointment: Yes Clinical Summary of Care: Electronic Signature(s) Signed: 03/10/2021 4:14:19 PM By: Donnamarie Poag Entered By: Donnamarie Poag on 03/10/2021 15:35:48 Buffkin, [name] ([id number]) -------------------------------------------------------------------------------- Lower Extremity Assessment Details Patient Name: SHANYIA, BALLIN Date of Service: 03/10/2021 3:00 PM Medical Record Number: [id number] Patient Account Number: [id number] Date of Birth/Sex: 01/20/1928 (87 y.o. F) Treating RN: Donnamarie Poag Primary Care Danetra Glock: Viviana Simpler Other Clinician: Referring Audery Wassenaar: Viviana Simpler Treating Daryel Kenneth/Extender: Skipper Cliche in Treatment: 20 Edema Assessment Assessed: [Left: Yes] Patrice Paradise: Yes] Edema: [Left: No] [Right: No] Calf Left: Right: Point of Measurement: 33 cm From Medial Instep 21 cm 21.5 cm Ankle Left: Right: Point of Measurement: 9 cm From Medial Instep 17.5 cm 18 cm Knee To Floor Left: Right: From Medial Instep 38 cm 38 cm Vascular Assessment Pulses: Dorsalis Pedis Palpable: [Left:Yes] [Right:Yes] Electronic Signature(s) Signed: 03/10/2021 4:14:19 PM By: Donnamarie Poag Entered By: Donnamarie Poag on 03/10/2021 15:06:30 Emert, [name] ([id number]) -------------------------------------------------------------------------------- Multi Wound Chart Details Patient Name: [name] Date of Service: 03/10/2021 3:00 PM Medical Record Number: [id number] Patient Account Number: [id number] Date of Birth/Sex: 1927-10-16 (87 y.o. F) Treating RN: Donnamarie Poag Primary Care  Zian Delair: Viviana Simpler Other Clinician: Referring Raydan Schlabach: Viviana Simpler Treating Arva Slaugh/Extender: Skipper Cliche in Treatment: 20 Vital Signs Height(in): 82 Pulse(bpm): 69 Weight(lbs): 53 Blood Pressure(mmHg): 154/70 Body Mass Index(BMI): 12 Temperature(F): 98.3 Respiratory Rate(breaths/min): 16 Photos: Wound Location: Right, Lateral Malleolus Left Calcaneus Right Ischial Tuberosity Wounding Event: Gradually Appeared Gradually Appeared Blister Primary Etiology: Pressure Ulcer Pressure Ulcer Pressure Ulcer Comorbid History: Arrhythmia, Coronary Artery Arrhythmia, Coronary Artery Arrhythmia, Coronary Artery Disease, Hypertension, Peripheral Disease, Hypertension, Peripheral Disease, Hypertension, Peripheral Arterial Disease, History of pressure Arterial Disease, History of pressure Arterial Disease, History of pressure wounds, Osteoarthritis wounds, Osteoarthritis wounds, Osteoarthritis Date Acquired: 10/22/2014 07/09/2020 01/16/2021 Weeks of Treatment: '20 20 6 '$ Wound Status: Open Open Open Measurements L x W x D (cm) 0.2x0.3x0.1 0.1x0.1x0.1 0.1x0.1x0.1 Area (cm) : 0.047 0.008 0.008 Volume (cm) : 0.005 0.001 0.001 % Reduction in  Area: 94.00% 99.50% 99.70% % Reduction in Volume: 93.70% 99.30% 99.70% Classification: Category/Stage III Unstageable/Unclassified Category/Stage III Exudate Amount: Medium Medium None Present Exudate Type: Serous Serous N/A Exudate Color: amber amber N/A Wound Margin: Flat and Intact N/A N/A Granulation Amount: Large (67-100%) Large (67-100%) None Present (0%) Granulation Quality: Red, Pink Pink, Pale N/A Necrotic Amount: Small (1-33%) Small (1-33%) None Present (0%) Exposed Structures: Fat Layer (Subcutaneous Tissue): Fat Layer (Subcutaneous Tissue): Fascia: No Yes Yes Fat Layer (Subcutaneous Tissue): Fascia: No Fascia: No No Tendon: No Tendon: No Tendon: No Muscle: No Muscle: No Muscle: No Joint: No Joint: No Joint: No Bone:  No Bone: No Bone: No Epithelialization: Small (1-33%) Small (1-33%) Small (1-33%) Treatment Notes Electronic Signature(s) Signed: 03/10/2021 4:14:19 PM By: Donnamarie Poag Entered By: Donnamarie Poag on 03/10/2021 15:10:49 Kingma, [name] ([id number]) -------------------------------------------------------------------------------- [place] Details Patient Name: SUZANNAH, DURNIL Date of Service: 03/10/2021 3:00 PM Medical Record Number: [id number] Patient Account Number: [id number] Date of Birth/Sex: 05-03-1928 (87 y.o. F) Treating RN: Donnamarie Poag Primary Care Daniel Ritthaler: Viviana Simpler Other Clinician: Referring Jenilee Franey: Viviana Simpler Treating Tyreanna Bisesi/Extender: Skipper Cliche in Treatment: 20 Active Inactive Electronic Signature(s) Signed: 03/10/2021 4:14:19 PM By: Donnamarie Poag Entered By: Donnamarie Poag on 03/10/2021 15:10:38 Voorhees, [name] ([id number]) -------------------------------------------------------------------------------- Pain Assessment Details Patient Name: BRENTON, ARBAIZA Date of Service: 03/10/2021 3:00 PM Medical Record Number: [id number] Patient Account Number: [id number] Date of Birth/Sex: Oct 09, 1927 (87 y.o. F) Treating RN: Donnamarie Poag Primary Care Srihan Brutus: Viviana Simpler Other Clinician: Referring Norberta Stobaugh: Viviana Simpler Treating Demara Lover/Extender: Skipper Cliche in Treatment: 20 Active Problems Location of Pain Severity and Description of Pain Patient Has Paino No Site Locations Rate the pain. Current Pain Level: 0 Pain Management and Medication Current Pain Management: Electronic Signature(s) Signed: 03/10/2021 4:14:19 PM By: Donnamarie Poag Entered By: Donnamarie Poag on 03/10/2021 14:53:45 G[place]burg, [name] ([id number]) -------------------------------------------------------------------------------- Patient/Caregiver Education Details Patient Name: [name] Date of Service: 03/10/2021 3:00 PM Medical Record Number: [id number] Patient  Account Number: [id number] Date of Birth/Gender: [birth year] (87 y.o. F) Treating RN: Donnamarie Poag Primary Care Physician: Viviana Simpler Other Clinician: Referring Physician: Viviana Simpler Treating Physician/Extender: Skipper Cliche in Treatment: 20 Education Assessment Education Provided To: Patient Education Topics Provided Basic Hygiene: Wound/Skin Impairment: Electronic Signature(s) Signed: 03/10/2021 4:14:19 PM By: Donnamarie Poag Entered By: Donnamarie Poag on 03/10/2021 15:11:02 Shehadeh, [name] ([id number]) -------------------------------------------------------------------------------- Wound Assessment Details Patient Name: AUBREYLYNN, KINDEL Date of Service: 03/10/2021 3:00 PM Medical Record Number: [id number] Patient Account Number: [id number] Date of Birth/Sex: 1928/02/09 (87 y.o. F) Treating RN: Donnamarie Poag Primary Care Carmel Garfield: Viviana Simpler Other Clinician: Referring Jasir Rother: Viviana Simpler Treating Nycole Kawahara/Extender: Skipper Cliche in Treatment: 20 Wound Status Wound Number: 1 Primary Pressure Ulcer Etiology: Wound Location: Right, Lateral Malleolus Wound Open Wounding Event: Gradually Appeared Status: Date Acquired: 10/22/2014 Comorbid Arrhythmia, Coronary Artery Disease, Hypertension, Weeks Of Treatment: 20 History: Peripheral Arterial Disease, History of pressure wounds, Clustered Wound: No Osteoarthritis Photos Wound Measurements Length: (cm) 0.2 Width: (cm) 0.3 Depth: (cm) 0.1 Area: (cm) 0.047 Volume: (cm) 0.005 % Reduction in Area: 94% % Reduction in Volume: 93.7% Epithelialization: Small (1-33%) Wound Description Classification: Category/Stage III Wound Margin: Flat and Intact Exudate Amount: Medium Exudate Type: Serous Exudate Color: amber Foul Odor After Cleansing: No Slough/Fibrino Yes Wound Bed Granulation Amount: Large (67-100%) Exposed Structure Granulation Quality: Red, Pink Fascia Exposed: No Necrotic Amount: Small (1-33%) Fat  Layer (Subcutaneous Tissue) Exposed: Yes Necrotic Quality: Adherent Slough Tendon Exposed: No Muscle Exposed: No  Joint Exposed: No Bone Exposed: No Treatment Notes Wound #1 (Malleolus) Wound Laterality: Right, Lateral Cleanser Soap and Water Discharge Instruction: Gently cleanse wound with antibacterial soap, rinse and pat dry prior to dressing wounds SHARNAE, FRIEDRICH ([id number]) Peri-Wound Care Topical Primary Dressing Hydrofera Blue Ready Transfer Foam, 2.5x2.5 (in/in) Discharge Instruction: Apply Hydrofera Blue Ready to wound bed as directed Secondary Dressing ABD Pad 5x9 (in/in) Discharge Instruction: Cover with ABD pad-also pad upper shin with extra ABD pad Secured With Compression Wrap Profore Lite LF 3 Multilayer Compression [place] Discharge Instruction: Apply 3 multi-layer wrap as prescribed. Compression Stockings Add-Ons Electronic Signature(s) Signed: 03/10/2021 4:14:19 PM By: Donnamarie Poag Entered By: Donnamarie Poag on 03/10/2021 15:03:52 Dehaas, [name] ([id number]) -------------------------------------------------------------------------------- Wound Assessment Details Patient Name: ANADELIA, MAXHAM Date of Service: 03/10/2021 3:00 PM Medical Record Number: [id number] Patient Account Number: [id number] Date of Birth/Sex: [birth year] (87 y.o. F) Treating RN: Donnamarie Poag Primary Care Kalani Baray: Viviana Simpler Other Clinician: Referring Tracye Szuch: Viviana Simpler Treating Gopal Malter/Extender: Skipper Cliche in Treatment: 20 Wound Status Wound Number: 2 Primary Pressure Ulcer Etiology: Wound Location: Left Calcaneus Wound Open Wounding Event: Gradually Appeared Status: Date Acquired: 07/09/2020 Comorbid Arrhythmia, Coronary Artery Disease, Hypertension, Weeks Of Treatment: 20 History: Peripheral Arterial Disease, History of pressure wounds, Clustered Wound: No Osteoarthritis Photos Wound Measurements Length: (cm) 0.1 Width: (cm) 0.1 Depth: (cm)  0.1 Area: (cm) 0.008 Volume: (cm) 0.001 % Reduction in Area: 99.5% % Reduction in Volume: 99.3% Epithelialization: Small (1-33%) Wound Description Classification: Unstageable/Unclassified Exudate Amount: Medium Exudate Type: Serous Exudate Color: amber Foul Odor After Cleansing: No Slough/Fibrino No Wound Bed Granulation Amount: Large (67-100%) Exposed Structure Granulation Quality: Pink, Pale Fascia Exposed: No Necrotic Amount: None Present (0%) Fat Layer (Subcutaneous Tissue) Exposed: Yes Tendon Exposed: No Muscle Exposed: No Joint Exposed: No Bone Exposed: No Treatment Notes Wound #2 (Calcaneus) Wound Laterality: Left Cleanser Soap and Water Discharge Instruction: Gently cleanse wound with antibacterial soap, rinse and pat dry prior to dressing wounds Peri-Wound Care ADITHRI, CASHELL ([id number]) Topical Primary Dressing Hydrofera Blue Ready Transfer Foam, 2.5x2.5 (in/in) Discharge Instruction: Apply Hydrofera Blue Ready to wound bed as directed Secondary Dressing ABD Pad 5x9 (in/in) Discharge Instruction: Cover with ABD pad Secured With Compression Wrap Profore Lite LF 3 Multilayer Compression Bandaging System Discharge Instruction: Apply 3 multi-layer wrap as prescribed. Compression Stockings Add-Ons Electronic Signature(s) Signed: 03/10/2021 4:14:19 PM By: Donnamarie Poag Entered By: Donnamarie Poag on 03/10/2021 15:13:44 Krukowski, [name] ([id number]) -------------------------------------------------------------------------------- Wound Assessment Details Patient Name: BRANDOLYN, PHANN Date of Service: 03/10/2021 3:00 PM Medical Record Number: [id number] Patient Account Number: [id number] Date of Birth/Sex: 05-27-1928 (87 y.o. F) Treating RN: Donnamarie Poag Primary Care Jisele Price: Viviana Simpler Other Clinician: Referring Archita Lomeli: Viviana Simpler Treating Myles Mallicoat/Extender: Skipper Cliche in Treatment: 20 Wound Status Wound Number: 4 Primary Pressure  Ulcer Etiology: Wound Location: Right Ischial Tuberosity Wound Healed - Epithelialized Wounding Event: Blister Status: Date Acquired: 01/16/2021 Comorbid Arrhythmia, Coronary Artery Disease, Hypertension, Weeks Of Treatment: 6 History: Peripheral Arterial Disease, History of pressure wounds, Clustered Wound: No Osteoarthritis Photos Wound Measurements Length: (cm) 0 % Red Width: (cm) 0 % Red Depth: (cm) 0 Epith Area: (cm) 0 Volume: (cm) 0 uction in Area: 100% uction in Volume: 100% elialization: Small (1-33%) Wound Description Classification: Category/Stage III Foul Exudate Amount: None Present Slou Odor After Cleansing: No gh/Fibrino No Wound Bed Granulation Amount: None Present (0%) Exposed Structure Necrotic Amount: None Present (0%) Fascia Exposed: No Fat Layer (Subcutaneous Tissue) Exposed: No Tendon Exposed: No  Muscle Exposed: No Joint Exposed: No Bone Exposed: No Treatment Notes Wound #4 (Ischial Tuberosity) Wound Laterality: Right Cleanser Peri-Wound Care Topical Primary Dressing ARMANDA, VIBERT ([id number]) Secondary Dressing Secured With Compression Wrap Compression Stockings Add-Ons Electronic Signature(s) Signed: 03/10/2021 4:14:19 PM By: Donnamarie Poag Entered By: Donnamarie Poag on 03/10/2021 15:15:19 Chanda, [name] ([id number]) -------------------------------------------------------------------------------- [place] Details Patient Name: [name] Date of Service: 03/10/2021 3:00 PM Medical Record Number: [id number] Patient Account Number: [id number] Date of Birth/Sex: 1927/09/05 (87 y.o. F) Treating RN: Donnamarie Poag Primary Care Masie Bermingham: Viviana Simpler Other Clinician: Referring Shaheer Bonfield: Viviana Simpler Treating Anice Wilshire/Extender: Skipper Cliche in Treatment: 20 Vital Signs Time Taken: 14:52 Temperature (F): 98.3 Height (in): 66 Pulse (bpm): 70 Weight (lbs): 76 Respiratory Rate (breaths/min): 16 Body Mass Index (BMI): 12.3 Blood  Pressure (mmHg): 154/70 Reference Range: 80 - 120 mg / dl Electronic Signature(s) Signed: 03/10/2021 4:14:19 PM By: Donnamarie Poag Entered ByDonnamarie Poag on 03/10/2021 14:53:35

## 2021-03-17 ENCOUNTER — Other Ambulatory Visit: Payer: Self-pay

## 2021-03-17 ENCOUNTER — Encounter: Payer: PPO | Admitting: Physician Assistant

## 2021-03-17 DIAGNOSIS — L89623 Pressure ulcer of left heel, stage 3: Secondary | ICD-10-CM | POA: Diagnosis not present

## 2021-03-17 DIAGNOSIS — L89513 Pressure ulcer of right ankle, stage 3: Secondary | ICD-10-CM | POA: Diagnosis not present

## 2021-03-17 DIAGNOSIS — L89620 Pressure ulcer of left heel, unstageable: Secondary | ICD-10-CM | POA: Diagnosis not present

## 2021-03-17 NOTE — Progress Notes (Addendum)
TAI, CHIAO ([id number]) Visit Report for 03/17/2021 Chief Complaint Document Details Patient Name: [name], [name]. Date of Service: 03/17/2021 3:00 PM Medical Record Number: [id number] Patient Account Number: [id number] Date of Birth/Sex: August 01, 1927 (87 y.o. F) Treating RN: Donnamarie Poag Primary Care Provider: Viviana Simpler Other Clinician: Referring Provider: Viviana Simpler Treating Provider/Extender: Skipper Cliche in Treatment: 21 Information Obtained from: Patient Chief Complaint Pressure ulcer right ankle and left heel and right ischial/buttock pressure ulcer Electronic Signature(s) Signed: 03/17/2021 3:08:41 PM By: Worthy Keeler PA-C Entered By: Worthy Keeler on 03/17/2021 15:08:41 [name] ([id number]) -------------------------------------------------------------------------------- Debridement Details Patient Name: [name] Date of Service: 03/17/2021 3:00 PM Medical Record Number: [id number] Patient Account Number: [id number] Date of Birth/Sex: 1928-03-24 (87 y.o. F) Treating RN: Donnamarie Poag Primary Care Provider: Viviana Simpler Other Clinician: Referring Provider: Viviana Simpler Treating Provider/Extender: Skipper Cliche in Treatment: 21 Debridement Performed for Wound #2 Left Calcaneus Assessment: Performed By: Physician Tommie Sams., PA-C Debridement Type: Debridement Level of Consciousness (Pre- Awake and Alert procedure): Pre-procedure Verification/Time Out Yes - 15:30 Taken: Start Time: 15:30 Pain Control: Lidocaine Total Area Debrided (L x W): 0.1 (cm) x 0.1 (cm) = 0.01 (cm) Tissue and other material Viable, Non-Viable, Slough, Subcutaneous, Skin: Dermis , Biofilm, Slough debrided: Level: Skin/Subcutaneous Tissue Debridement Description: Excisional Instrument: Curette Bleeding: Minimum Hemostasis Achieved: Pressure Response to Treatment: Procedure was tolerated well Level of Consciousness (Post- Awake and  Alert procedure): Post Debridement Measurements of Total Wound Length: (cm) 0.3 Stage: Unstageable/Unclassified Width: (cm) 0.3 Depth: (cm) 0.1 Volume: (cm) 0.007 Character of Wound/Ulcer Post Debridement: Improved Post Procedure Diagnosis Same as Pre-procedure Electronic Signature(s) Signed: 03/17/2021 4:12:02 PM By: Donnamarie Poag Signed: 03/17/2021 4:41:08 PM By: Worthy Keeler PA-C Entered By: Donnamarie Poag on 03/17/2021 15:33:38 [name], [name] ([id number]) -------------------------------------------------------------------------------- Debridement Details Patient Name: [name] Date of Service: 03/17/2021 3:00 PM Medical Record Number: [id number] Patient Account Number: [id number] Date of Birth/Sex: 1928/04/04 (87 y.o. F) Treating RN: Donnamarie Poag Primary Care Provider: Viviana Simpler Other Clinician: Referring Provider: Viviana Simpler Treating Provider/Extender: Skipper Cliche in Treatment: 21 Debridement Performed for Wound #1 Right,Lateral Malleolus Assessment: Performed By: Physician Tommie Sams., PA-C Debridement Type: Debridement Level of Consciousness (Pre- Awake and Alert procedure): Pre-procedure Verification/Time Out Yes - 15:30 Taken: Start Time: 15:32 Pain Control: Lidocaine Total Area Debrided (L x W): 0.2 (cm) x 0.2 (cm) = 0.04 (cm) Tissue and other material Viable, Non-Viable, Slough, Subcutaneous, Skin: Dermis , Biofilm, Slough debrided: Level: Skin/Subcutaneous Tissue Debridement Description: Excisional Instrument: Curette Bleeding: Minimum Hemostasis Achieved: Pressure Response to Treatment: Procedure was tolerated well Level of Consciousness (Post- Awake and Alert procedure): Post Debridement Measurements of Total Wound Length: (cm) 0.2 Stage: Category/Stage III Width: (cm) 0.2 Depth: (cm) 0.1 Volume: (cm) 0.003 Character of Wound/Ulcer Post Debridement: Improved Post Procedure Diagnosis Same as Pre-procedure Electronic  Signature(s) Signed: 03/17/2021 4:12:02 PM By: Donnamarie Poag Signed: 03/17/2021 4:41:08 PM By: Worthy Keeler PA-C Entered By: Donnamarie Poag on 03/17/2021 15:34:24 [name], [name] ([id number]) -------------------------------------------------------------------------------- HPI Details Patient Name: [name], [name] Date of Service: 03/17/2021 3:00 PM Medical Record Number: [id number] Patient Account Number: [id number] Date of Birth/Sex: [birth year] (87 y.o. F) Treating RN: Donnamarie Poag Primary Care Provider: Viviana Simpler Other Clinician: Referring Provider: Viviana Simpler Treating Provider/Extender: Skipper Cliche in Treatment: 21 History of Present Illness HPI Description: 10/21/2020 upon evaluation today patient presents for initial inspection here in our clinic concerning issues that she has been having quite  some time in regard to her ankle and since she has been in the hospital with regard to the heel. The ankle in fact has been 5-6 years at least I am told. With that being said the heel ulcer occurred when she was in the hospital in December for hip surgery when she fractured her hip. She also was in the hospital Tuesday for altered mental status. Really there was nothing that was identified as the cause for this. She did have a. Fortunately there does not appear to be any signs of infection she tells me that she has had she believes arterial studies At [facility] clinic I could not find Those studies at this point. Nonetheless I will continue to look and see what I can find before Then. The patient also has a skin tear on her arm this occurred more recently when she bumped this at home. The patient does have a history of hypertension, coronary artery disease, and peripheral vascular disease stated. 10/28/2020 I was able to find the patient's chart currently which shows that she did have an arterial study performed in May 2019. This showed that she had a abnormal right toe brachial index and a  normal left toe brachial index. She was noncompressible as far as ABIs were concerned. She did appear to have triphasic flow at that time. Unfortunately the wound that is commented on in the report that I printed off and read mentions the same wound on the ankle that we are still dealing with at this point. Unfortunately this has not healed. And its been quite sometime about 3 years now. Fortunately there does not appear to be any signs of active infection systemically at this point. I think that the patient has done well with the Santyl over the past week which is good news. Patient's caregiver which is her daughter-in-law is concerned about the fact that she really does not feel qualified to be able to change the dressings and take care of this issue. There does not appear to be any signs of anything untoward going on at this point. I think she is done a great job applying the [employer] and I think that has done a great job for the patient is for soften up some of the necrotic tissue. With that being said I think the Xeroform on the arm also has done excellent. In general I am very pleased with where we stand. And I told the patient's daughter-in- law as well that I also feel like she has done a great job over the past week taking care of her mother. 11/11/2020 upon evaluation today patient appears to be doing well with regard to her wounds. She has been tolerating the dressing changes without complication her daughters been applying the Santyl which has done a great job. Ho with that being said I think now that we have good arterial study showing we can go ahead and proceed with sharp debridement at this point. 11/25/2020 upon evaluation today patient appears to be doing well with regard to her wounds. The Santyl has really helped to clean things up and overall she is doing quite excellent at this point. Fortunately there does not appear to be any signs of infection which is great news and in  general extremely pleased. I do believe some debridement is in order and hopefully will be able to get her into a collagen dressing after this. 12/23/2020 upon evaluation today patient appears to be doing well with regard to her wound all things  considered. Unfortunately it does sound like her daughter decided to let this air out because she felt like it was getting so wet. It sounds like she got border gauze dressings instead of border foam therefore there is really Nothing to catch the excess drainage which I think is the problem they were worried about the smell. Fortunately there does not appear to be any signs of active infection which is great news. Nonetheless I do not think we want to leave this just open to air. 01/13/2021 upon evaluation today patient's wounds actually appear to be about as good as have seen since have been taking care of her. Fortunately there does not appear to be any evidence of infection which is great and overall the biggest issue I see is that of fluid buildup. I think we need to do something to try to help manage this. I think if we can control her edema we get the wounds to heal more effectively. 01/27/2021 upon evaluation today patient appears to be doing well in regard to the wounds on her right lateral malleolus as well as the left heel and the right ischial tuberosity is unfortunately a new area that has arisen since we last saw her. She tells me currently that that is from sitting too much. With that being said this actually appears to be doing the worst of anything so far that I see today. Fortunately there does not appear to be any signs of infection this is at least good news. Compression wrap seem to be doing excellent for her as far as the legs are concerned. 02/03/2021 upon evaluation today patient appears to be doing well with regard to all of her wounds. She has been tolerating the dressing changes without complication. Fortunately there is no signs of active  infection at this time. No fevers, chills, nausea, vomiting, or diarrhea. 02/10/2021 upon evaluation today patient appears to be doing well with regard to her wounds. With that being said there is some slight evidence of hypergranulation in regard to the right ankle in particular and a little bit in regard to the left heel. I think Hydrofera Blue might be a better option to go to at this point based on what I am seeing especially since both of these wounds in particular seem to be a little bit stalled. I discussed that with the patient and her daughter today. With regard to the hip this is doing great with the collagen I think will get very close to complete closure I recommend we continue with the collagen. 02/17/2021 upon evaluation today patient appears to be doing excellent in regard to her heel ulcers. She has been tolerating the dressing changes without complication and overall I am extremely pleased with where things stand today. There does not appear to be any signs of active infection which is great news. Overall I may think that we are headed in the proper direction based on what I am seeing. 02/24/2021 upon evaluation today patient's wounds actually are showing signs of improvement in regard to her heels both are doing awesome. In regard to her hip location this is actually reopened after being closed last week and to be perfectly honest I am more concerned here about the fact that pressures causing this issue I do not think it has anything to do with her not having the collagen in place again it was completely healed last week there was no reason for the collagen to be there. 03/03/2021 upon evaluation today patient appears to  be doing well with regard to her wounds. Everything is showing signs of improvement and [name], [name]. ([id number]) doing some much better as far as the overall size of the wound. Fortunately I think we are headed in the right direction. 03/10/2021 upon evaluation today  patient appears to be doing well with regard to her wound. She is tolerating the dressing changes without complication. The left heel pretty much appears to be almost completely healed although I think it is probably can be 1 more week before I can call this 100% slough. The right ankle is significantly improved with that being said its not completely closed. With regard to the right hip this is completely closed. Overall I am very pleased with where things stand I think were getting very close to complete resolution. I discussed all this with the patient and her daughter-in-law today. 03/17/2021 upon evaluation today patient appears to be doing well with regard to her wounds. Fortunately there does not appear to be any signs of active infection which is great news and overall very pleased with where the patient stands today. I am in general thankful that overall she is making great progress here. There is good to be a little bit of debridement here to clear away some of the necrotic debris today on both wound locations. Electronic Signature(s) Signed: 03/17/2021 3:37:15 PM By: Worthy Keeler PA-C Entered By: Worthy Keeler on 03/17/2021 15:37:15 [name], [name] ([id number]) -------------------------------------------------------------------------------- Physical Exam Details Patient Name: [name], [name] Date of Service: 03/17/2021 3:00 PM Medical Record Number: [id number] Patient Account Number: [id number] Date of Birth/Sex: 1927/12/03 (87 y.o. F) Treating RN: Donnamarie Poag Primary Care Provider: Viviana Simpler Other Clinician: Referring Provider: Viviana Simpler Treating Provider/Extender: Skipper Cliche in Treatment: 21 Constitutional Well-nourished and well-hydrated in no acute distress. Respiratory normal breathing without difficulty. Psychiatric this patient is able to make decisions and demonstrates good insight into disease process. Alert and Oriented x 3. pleasant and  cooperative. Notes Upon inspection patient's wound bed actually showed signs of good granulation and epithelization at this point. Fortunately there does not appear to be any signs of infection which is great and overall I am extremely pleased with where the patient stands today. Electronic Signature(s) Signed: 03/17/2021 3:38:01 PM By: Worthy Keeler PA-C Entered By: Worthy Keeler on 03/17/2021 15:38:01 [name], [name] ([id number]) -------------------------------------------------------------------------------- Physician Orders Details Patient Name: AREIANA, TAVES Date of Service: 03/17/2021 3:00 PM Medical Record Number: [id number] Patient Account Number: [id number] Date of Birth/Sex: 03-19-1928 (87 y.o. F) Treating RN: Donnamarie Poag Primary Care Provider: Viviana Simpler Other Clinician: Referring Provider: Viviana Simpler Treating Provider/Extender: Skipper Cliche in Treatment: 21 Verbal / Phone Orders: No Diagnosis Coding ICD-10 Coding Code Description L89.513 Pressure ulcer of right ankle, stage 3 L89.623 Pressure ulcer of left heel, stage 3 S51.802A Unspecified open wound of left forearm, initial encounter L89.312 Pressure ulcer of right buttock, stage 2 I10 Essential (primary) hypertension I25.10 Atherosclerotic heart disease of native coronary artery without angina pectoris I73.89 Other specified peripheral vascular diseases Follow-up Appointments o Return Appointment in 1 week. o Nurse Visit as needed Bathing/ Shower/ Hygiene o May shower with wound dressing protected with water repellent cover or cast protector. o No tub bath. Edema Control - Lymphedema / Segmental Compressive Device / Other Bilateral Lower Extremities o Optional: One layer of unna paste to top of compression wrap (to act as an anchor). o Elevate legs to the level of the heart and pump ankles as  often as possible o Elevate leg(s) parallel to the floor when sitting. o DO YOUR BEST to  sleep in the bed at night. DO NOT sleep in your recliner. Long hours of sitting in a recliner leads to swelling of the legs and/or potential wounds on your backside. Off-Loading o Gel wheelchair cushion - prop when sitting or laying to avoid pressure on your hip wound o Turn and reposition every 2 hours o Other: - Border foam dressing for protection to right hip Wound Treatment Wound #1 - Malleolus Wound Laterality: Right, Lateral Cleanser: Soap and Water 1 x Per Week/30 Days Discharge Instructions: Gently cleanse wound with antibacterial soap, rinse and pat dry prior to dressing wounds Primary Dressing: Silvercel Small 2x2 (in/in) 1 x Per Week/30 Days Discharge Instructions: Apply Silvercel Small 2x2 (in/in) as instructed Secondary Dressing: ABD Pad 5x9 (in/in) 1 x Per Week/30 Days Discharge Instructions: Cover with ABD pad-also pad upper shin with extra ABD pad Compression Wrap: Profore Lite LF 3 Multilayer Compression Bandaging System 1 x Per Week/30 Days Discharge Instructions: Apply 3 multi-layer wrap as prescribed. Wound #2 - Calcaneus Wound Laterality: Left Cleanser: Soap and Water 1 x Per Day/30 Days Discharge Instructions: Gently cleanse wound with antibacterial soap, rinse and pat dry prior to dressing wounds Primary Dressing: Silvercel Small 2x2 (in/in) 1 x Per Day/30 Days Discharge Instructions: Apply Silvercel Small 2x2 (in/in) as instructed Secondary Dressing: ABD Pad 5x9 (in/in) 1 x Per Day/30 Days [name], [name] ([id number]) Discharge Instructions: Cover with ABD pad Compression Wrap: Profore Lite LF 3 Multilayer Compression Bandaging System 1 x Per Day/30 Days Discharge Instructions: Apply 3 multi-layer wrap as prescribed. Electronic Signature(s) Signed: 03/17/2021 4:12:02 PM By: Donnamarie Poag Signed: 03/17/2021 4:41:08 PM By: Worthy Keeler PA-C Entered By: Donnamarie Poag on 03/17/2021 15:35:43 [name], [name]  ([id number]) -------------------------------------------------------------------------------- Problem List Details Patient Name: DAYLANI, LOUDENSLAGER Date of Service: 03/17/2021 3:00 PM Medical Record Number: [id number] Patient Account Number: [id number] Date of Birth/Sex: Apr 09, 1928 (87 y.o. F) Treating RN: Donnamarie Poag Primary Care Provider: Viviana Simpler Other Clinician: Referring Provider: Viviana Simpler Treating Provider/Extender: Skipper Cliche in Treatment: 21 Active Problems ICD-10 Encounter Code Description Active Date MDM Diagnosis L89.513 Pressure ulcer of right ankle, stage 3 10/21/2020 No Yes L89.623 Pressure ulcer of left heel, stage 3 10/21/2020 No Yes S51.802A Unspecified open wound of left forearm, initial encounter 10/21/2020 No Yes L89.312 Pressure ulcer of right buttock, stage 2 01/27/2021 No Yes I10 Essential (primary) hypertension 10/21/2020 No Yes I25.10 Atherosclerotic heart disease of native coronary artery without angina 10/21/2020 No Yes pectoris I73.89 Other specified peripheral vascular diseases 10/21/2020 No Yes Inactive Problems Resolved Problems Electronic Signature(s) Signed: 03/17/2021 3:08:35 PM By: Worthy Keeler PA-C Entered By: Worthy Keeler on 03/17/2021 15:08:35 [name], [name] ([id number]) -------------------------------------------------------------------------------- Progress Note Details Patient Name: [name] Date of Service: 03/17/2021 3:00 PM Medical Record Number: [id number] Patient Account Number: [id number] Date of Birth/Sex: 05-12-1928 (87 y.o. F) Treating RN: Donnamarie Poag Primary Care Provider: Viviana Simpler Other Clinician: Referring Provider: Viviana Simpler Treating Provider/Extender: Skipper Cliche in Treatment: 21 Subjective Chief Complaint Information obtained from Patient Pressure ulcer right ankle and left heel and right ischial/buttock pressure ulcer History of Present Illness (HPI) 10/21/2020 upon evaluation  today patient presents for initial inspection here in our clinic concerning issues that she has been having quite some time in regard to her ankle and since she has been in the hospital with regard to the heel. The ankle in  fact has been 5-6 years at least I am told. With that being said the heel ulcer occurred when she was in the hospital in December for hip surgery when she fractured her hip. She also was in the hospital Tuesday for altered mental status. Really there was nothing that was identified as the cause for this. She did have a. Fortunately there does not appear to be any signs of infection she tells me that she has had she believes arterial studies At [facility] clinic I could not find Those studies at this point. Nonetheless I will continue to look and see what I can find before Then. The patient also has a skin tear on her arm this occurred more recently when she bumped this at home. The patient does have a history of hypertension, coronary artery disease, and peripheral vascular disease stated. 10/28/2020 I was able to find the patient's chart currently which shows that she did have an arterial study performed in May 2019. This showed that she had a abnormal right toe brachial index and a normal left toe brachial index. She was noncompressible as far as ABIs were concerned. She did appear to have triphasic flow at that time. Unfortunately the wound that is commented on in the report that I printed off and read mentions the same wound on the ankle that we are still dealing with at this point. Unfortunately this has not healed. And its been quite sometime about 3 years now. Fortunately there does not appear to be any signs of active infection systemically at this point. I think that the patient has done well with the Santyl over the past week which is good news. Patient's caregiver which is her daughter-in-law is concerned about the fact that she really does not feel qualified to be able to  change the dressings and take care of this issue. There does not appear to be any signs of anything untoward going on at this point. I think she is done a great job applying the [employer] and I think that has done a great job for the patient is for soften up some of the necrotic tissue. With that being said I think the Xeroform on the arm also has done excellent. In general I am very pleased with where we stand. And I told the patient's daughter-in- law as well that I also feel like she has done a great job over the past week taking care of her mother. 11/11/2020 upon evaluation today patient appears to be doing well with regard to her wounds. She has been tolerating the dressing changes without complication her daughters been applying the Santyl which has done a great job. Ho with that being said I think now that we have good arterial study showing we can go ahead and proceed with sharp debridement at this point. 11/25/2020 upon evaluation today patient appears to be doing well with regard to her wounds. The Santyl has really helped to clean things up and overall she is doing quite excellent at this point. Fortunately there does not appear to be any signs of infection which is great news and in general extremely pleased. I do believe some debridement is in order and hopefully will be able to get her into a collagen dressing after this. 12/23/2020 upon evaluation today patient appears to be doing well with regard to her wound all things considered. Unfortunately it does sound like her daughter decided to let this air out because she felt like it was getting so wet. It  sounds like she got border gauze dressings instead of border foam therefore there is really Nothing to catch the excess drainage which I think is the problem they were worried about the smell. Fortunately there does not appear to be any signs of active infection which is great news. Nonetheless I do not think we want to leave this just open to  air. 01/13/2021 upon evaluation today patient's wounds actually appear to be about as good as have seen since have been taking care of her. Fortunately there does not appear to be any evidence of infection which is great and overall the biggest issue I see is that of fluid buildup. I think we need to do something to try to help manage this. I think if we can control her edema we get the wounds to heal more effectively. 01/27/2021 upon evaluation today patient appears to be doing well in regard to the wounds on her right lateral malleolus as well as the left heel and the right ischial tuberosity is unfortunately a new area that has arisen since we last saw her. She tells me currently that that is from sitting too much. With that being said this actually appears to be doing the worst of anything so far that I see today. Fortunately there does not appear to be any signs of infection this is at least good news. Compression wrap seem to be doing excellent for her as far as the legs are concerned. 02/03/2021 upon evaluation today patient appears to be doing well with regard to all of her wounds. She has been tolerating the dressing changes without complication. Fortunately there is no signs of active infection at this time. No fevers, chills, nausea, vomiting, or diarrhea. 02/10/2021 upon evaluation today patient appears to be doing well with regard to her wounds. With that being said there is some slight evidence of hypergranulation in regard to the right ankle in particular and a little bit in regard to the left heel. I think Hydrofera Blue might be a better option to go to at this point based on what I am seeing especially since both of these wounds in particular seem to be a little bit stalled. I discussed that with the patient and her daughter today. With regard to the hip this is doing great with the collagen I think will get very close to complete closure I recommend we continue with the  collagen. 02/17/2021 upon evaluation today patient appears to be doing excellent in regard to her heel ulcers. She has been tolerating the dressing changes without complication and overall I am extremely pleased with where things stand today. There does not appear to be any signs of active infection which is great news. Overall I may think that we are headed in the proper direction based on what I am seeing. 02/24/2021 upon evaluation today patient's wounds actually are showing signs of improvement in regard to her heels both are doing awesome. In [place], [place] ([id number]) regard to her hip location this is actually reopened after being closed last week and to be perfectly honest I am more concerned here about the fact that pressures causing this issue I do not think it has anything to do with her not having the collagen in place again it was completely healed last week there was no reason for the collagen to be there. 03/03/2021 upon evaluation today patient appears to be doing well with regard to her wounds. Everything is showing signs of improvement and doing some much better  as far as the overall size of the wound. Fortunately I think we are headed in the right direction. 03/10/2021 upon evaluation today patient appears to be doing well with regard to her wound. She is tolerating the dressing changes without complication. The left heel pretty much appears to be almost completely healed although I think it is probably can be 1 more week before I can call this 100% slough. The right ankle is significantly improved with that being said its not completely closed. With regard to the right hip this is completely closed. Overall I am very pleased with where things stand I think were getting very close to complete resolution. I discussed all this with the patient and her daughter-in-law today. 03/17/2021 upon evaluation today patient appears to be doing well with regard to her wounds. Fortunately there does  not appear to be any signs of active infection which is great news and overall very pleased with where the patient stands today. I am in general thankful that overall she is making great progress here. There is good to be a little bit of debridement here to clear away some of the necrotic debris today on both wound locations. Objective Constitutional Well-nourished and well-hydrated in no acute distress. Vitals Time Taken: 3:11 PM, Height: 66 in, Weight: 76 lbs, BMI: 12.3, Temperature: 98.1 F, Pulse: 68 bpm, Respiratory Rate: 16 breaths/min, Blood Pressure: 157/69 mmHg. Respiratory normal breathing without difficulty. Psychiatric this patient is able to make decisions and demonstrates good insight into disease process. Alert and Oriented x 3. pleasant and cooperative. General Notes: Upon inspection patient's wound bed actually showed signs of good granulation and epithelization at this point. Fortunately there does not appear to be any signs of infection which is great and overall I am extremely pleased with where the patient stands today. Integumentary (Hair, Skin) Wound #1 status is Open. Original cause of wound was Gradually Appeared. The date acquired was: 10/22/2014. The wound has been in treatment 21 weeks. The wound is located on the Right,Lateral Malleolus. The wound measures 0.2cm length x 0.2cm width x 0.1cm depth; 0.031cm^2 area and 0.003cm^3 volume. There is Fat Layer (Subcutaneous Tissue) exposed. There is no tunneling or undermining noted. There is a medium amount of serous drainage noted. The wound margin is flat and intact. There is large (67-100%) red, pink granulation within the wound bed. There is a small (1-33%) amount of necrotic tissue within the wound bed including Adherent Slough. Wound #2 status is Open. Original cause of wound was Gradually Appeared. The date acquired was: 07/09/2020. The wound has been in treatment 21 weeks. The wound is located on the Left Calcaneus.  The wound measures 0.1cm length x 0.1cm width x 0.1cm depth; 0.008cm^2 area and 0.001cm^3 volume. There is a none present amount of drainage noted. There is no granulation within the wound bed. There is a large (67-100%) amount of necrotic tissue within the wound bed including Adherent Slough. Assessment Active Problems ICD-10 Pressure ulcer of right ankle, stage 3 Pressure ulcer of left heel, stage 3 Unspecified open wound of left forearm, initial encounter Pressure ulcer of right buttock, stage 2 Essential (primary) hypertension Atherosclerotic heart disease of native coronary artery without angina pectoris Other specified peripheral vascular diseases [name], [name] ([id number]) Procedures Wound #1 Pre-procedure diagnosis of Wound #1 is a Pressure Ulcer located on the Right,Lateral Malleolus . There was a Excisional Skin/Subcutaneous Tissue Debridement with a total area of 0.04 sq cm performed by Tommie Sams., PA-C. With the following  instrument(s): Curette to remove Viable and Non-Viable tissue/material. Material removed includes Subcutaneous Tissue, Slough, Skin: Dermis, and Biofilm after achieving pain control using Lidocaine. A time out was conducted at 15:30, prior to the start of the procedure. A Minimum amount of bleeding was controlled with Pressure. The procedure was tolerated well. Post Debridement Measurements: 0.2cm length x 0.2cm width x 0.1cm depth; 0.003cm^3 volume. Post debridement Stage noted as Category/Stage III. Character of Wound/Ulcer Post Debridement is improved. Post procedure Diagnosis Wound #1: Same as Pre-Procedure Pre-procedure diagnosis of Wound #1 is a Pressure Ulcer located on the Right,Lateral Malleolus . There was a Three Layer Compression Therapy Procedure by Donnamarie Poag, RN. Post procedure Diagnosis Wound #1: Same as Pre-Procedure Wound #2 Pre-procedure diagnosis of Wound #2 is a Pressure Ulcer located on the Left Calcaneus . There was a Excisional  Skin/Subcutaneous Tissue Debridement with a total area of 0.01 sq cm performed by Tommie Sams., PA-C. With the following instrument(s): Curette to remove Viable and Non-Viable tissue/material. Material removed includes Subcutaneous Tissue, Slough, Skin: Dermis, and Biofilm after achieving pain control using Lidocaine. A time out was conducted at 15:30, prior to the start of the procedure. A Minimum amount of bleeding was controlled with Pressure. The procedure was tolerated well. Post Debridement Measurements: 0.3cm length x 0.3cm width x 0.1cm depth; 0.007cm^3 volume. Post debridement Stage noted as Unstageable/Unclassified. Character of Wound/Ulcer Post Debridement is improved. Post procedure Diagnosis Wound #2: Same as Pre-Procedure Pre-procedure diagnosis of Wound #2 is a Pressure Ulcer located on the Left Calcaneus . There was a Three Layer Compression Therapy Procedure by Donnamarie Poag, RN. Post procedure Diagnosis Wound #2: Same as Pre-Procedure Plan Follow-up Appointments: Return Appointment in 1 week. Nurse Visit as needed Bathing/ Shower/ Hygiene: May shower with wound dressing protected with water repellent cover or cast protector. No tub bath. Edema Control - Lymphedema / Segmental Compressive Device / Other: Optional: One layer of unna paste to top of compression wrap (to act as an anchor). Elevate legs to the level of the heart and pump ankles as often as possible Elevate leg(s) parallel to the floor when sitting. DO YOUR BEST to sleep in the bed at night. DO NOT sleep in your recliner. Long hours of sitting in a recliner leads to swelling of the legs and/or potential wounds on your backside. Off-Loading: Gel wheelchair cushion - prop when sitting or laying to avoid pressure on your hip wound Turn and reposition every 2 hours Other: - Border foam dressing for protection to right hip WOUND #1: - Malleolus Wound Laterality: Right, Lateral Cleanser: Soap and Water 1 x Per  Week/30 Days Discharge Instructions: Gently cleanse wound with antibacterial soap, rinse and pat dry prior to dressing wounds Primary Dressing: Silvercel Small 2x2 (in/in) 1 x Per Week/30 Days Discharge Instructions: Apply Silvercel Small 2x2 (in/in) as instructed Secondary Dressing: ABD Pad 5x9 (in/in) 1 x Per Week/30 Days Discharge Instructions: Cover with ABD pad-also pad upper shin with extra ABD pad Compression Wrap: Profore Lite LF 3 Multilayer Compression Bandaging System 1 x Per Week/30 Days Discharge Instructions: Apply 3 multi-layer wrap as prescribed. WOUND #2: - Calcaneus Wound Laterality: Left Cleanser: Soap and Water 1 x Per Day/30 Days Discharge Instructions: Gently cleanse wound with antibacterial soap, rinse and pat dry prior to dressing wounds Primary Dressing: Silvercel Small 2x2 (in/in) 1 x Per Day/30 Days Discharge Instructions: Apply Silvercel Small 2x2 (in/in) as instructed Secondary Dressing: ABD Pad 5x9 (in/in) 1 x Per Day/30 Days Discharge Instructions: Cover  with ABD pad Compression Wrap: Profore Lite LF 3 Multilayer Compression Bandaging System 1 x Per Day/30 Days Discharge Instructions: Apply 3 multi-layer wrap as prescribed. ANDRES, MARZILLI ([id number]) 1. Would recommend currently that we go ahead and continue with the wound care measures with the compression wraps I think that is doing a great job for the patient. 2. I am good recommend however we switch to a silver alginate dressing to try to keep everything nice and dry I think this would be the best way to go. 3. I am also going to suggest that the patient continue to elevate her legs is much as possible to help with edema control though the compression wraps are doing an awesome job. We will see patient back for reevaluation in 1 week here in the clinic. If anything worsens or changes patient will contact our office for additional recommendations. Electronic Signature(s) Signed: 03/17/2021 3:38:31 PM By:  Worthy Keeler PA-C Entered By: Worthy Keeler on 03/17/2021 15:38:31 Cafarelli, [name] ([id number]) -------------------------------------------------------------------------------- SuperBill Details Patient Name: TOMIE, SZABO Date of Service: 03/17/2021 Medical Record Number: [id number] Patient Account Number: [id number] Date of Birth/Sex: 02-May-1928 (87 y.o. F) Treating RN: Donnamarie Poag Primary Care Provider: Viviana Simpler Other Clinician: Referring Provider: Viviana Simpler Treating Provider/Extender: Skipper Cliche in Treatment: 21 Diagnosis Coding ICD-10 Codes Code Description [phone] Pressure ulcer of right ankle, stage 3 L89.623 Pressure ulcer of left heel, stage 3 S51.802A Unspecified open wound of left forearm, initial encounter L89.312 Pressure ulcer of right buttock, stage 2 I10 Essential (primary) hypertension I25.10 Atherosclerotic heart disease of native coronary artery without angina pectoris I73.89 Other specified peripheral vascular diseases Facility Procedures CPT4 Code: [id number] Description: [id number] - DEB SUBQ TISSUE 20 SQ CM/< Modifier: Quantity: 1 CPT4 Code: Description: ICD-10 Diagnosis Description L89.513 Pressure ulcer of right ankle, stage 3 L89.623 Pressure ulcer of left heel, stage 3 Modifier: Quantity: Physician Procedures CPT4 Code: [id number] Description: 11042 - WC PHYS SUBQ TISS 20 SQ CM Modifier: Quantity: 1 CPT4 Code: Description: ICD-10 Diagnosis Description L89.513 Pressure ulcer of right ankle, stage 3 L89.623 Pressure ulcer of left heel, stage 3 Modifier: Quantity: Electronic Signature(s) Signed: 03/17/2021 3:49:36 PM By: Worthy Keeler PA-C Entered By: Worthy Keeler on 03/17/2021 15:49:35

## 2021-03-17 NOTE — Progress Notes (Addendum)
Elizabeth, Downs ([id number]) Visit Report for 03/17/2021 Arrival Information Details Patient Name: [name], [name] Date of Service: 03/17/2021 3:00 PM Medical Record Number: [id number] Patient Account Number: [id number] Date of Birth/Sex: 1927-11-16 (87 y.o. F) Treating RN: Donnamarie Poag Primary Care Jaysiah Marchetta: Viviana Simpler Other Clinician: Referring Janelle Culton: Viviana Simpler Treating Chanika Byland/Extender: Skipper Cliche in Treatment: 21 Visit Information History Since Last Visit Added or deleted any medications: No Patient Arrived: [name] Had a fall or experienced change in No Arrival Time: 15:09 activities of daily living that may affect Accompanied By: daughter risk of falls: Transfer Assistance: None Hospitalized since last visit: No Patient Identification Verified: Yes Has Dressing in Place as Prescribed: Yes Secondary Verification Process Completed: Yes Has Compression in Place as Prescribed: Yes Patient Has Alerts: Yes Pain Present Now: No Patient Alerts: NOT DIABETIC TBI left .77 TBI right .76 Electronic Signature(s) Signed: 03/17/2021 4:12:02 PM By: Donnamarie Poag Entered By: Donnamarie Poag on 03/17/2021 15:11:58 [name] ([id number]) -------------------------------------------------------------------------------- Clinic Level of Care Assessment Details Patient Name: Elizabeth, Downs Date of Service: 03/17/2021 3:00 PM Medical Record Number: [id number] Patient Account Number: [id number] Date of Birth/Sex: Aug 01, 1927 (87 y.o. F) Treating RN: Donnamarie Poag Primary Care Othal Kubitz: Viviana Simpler Other Clinician: Referring Adriaan Maltese: Viviana Simpler Treating Keydi Giel/Extender: Skipper Cliche in Treatment: 21 Clinic Level of Care Assessment Items TOOL 1 Quantity Score '[]'$  - Use when EandM and Procedure is performed on INITIAL visit 0 ASSESSMENTS - Nursing Assessment / Reassessment '[]'$  - General Physical Exam (combine w/ comprehensive assessment (listed just below) when  performed on new 0 pt. evals) '[]'$  - 0 Comprehensive Assessment (HX, ROS, Risk Assessments, Wounds Hx, etc.) ASSESSMENTS - Wound and Skin Assessment / Reassessment '[]'$  - Dermatologic / Skin Assessment (not related to wound area) 0 ASSESSMENTS - Ostomy and/or Continence Assessment and Care '[]'$  - Incontinence Assessment and Management 0 '[]'$  - 0 Ostomy Care Assessment and Management (repouching, etc.) PROCESS - Coordination of Care '[]'$  - Simple Patient / Family Education for ongoing care 0 '[]'$  - 0 Complex (extensive) Patient / Family Education for ongoing care '[]'$  - 0 Staff obtains Programmer, systems, Records, Test Results / Process Orders '[]'$  - 0 Staff telephones HHA, Nursing Homes / Clarify orders / etc '[]'$  - 0 Routine Transfer to another Facility (non-emergent condition) '[]'$  - 0 Routine Hospital Admission (non-emergent condition) '[]'$  - 0 New Admissions / Biomedical engineer / Ordering NPWT, Apligraf, etc. '[]'$  - 0 Emergency Hospital Admission (emergent condition) PROCESS - Special Needs '[]'$  - Pediatric / Minor Patient Management 0 '[]'$  - 0 Isolation Patient Management '[]'$  - 0 Hearing / Language / Visual special needs '[]'$  - 0 Assessment of Community assistance (transportation, D/C planning, etc.) '[]'$  - 0 Additional assistance / Altered mentation '[]'$  - 0 Support Surface(s) Assessment (bed, cushion, seat, etc.) INTERVENTIONS - Miscellaneous '[]'$  - External ear exam 0 '[]'$  - 0 Patient Transfer (multiple staff / Civil Service fast streamer / Similar devices) '[]'$  - 0 Simple Staple / Suture removal (25 or less) '[]'$  - 0 Complex Staple / Suture removal (26 or more) '[]'$  - 0 Hypo/Hyperglycemic Management (do not check if billed separately) '[]'$  - 0 Ankle / Brachial Index (ABI) - do not check if billed separately Has the patient been seen at the hospital within the last three years: Yes Total Score: 0 Level Of Care: ____ [name] ([id number]) Electronic Signature(s) Signed: 03/17/2021 4:12:02 PM By: Donnamarie Poag Entered By: Donnamarie Poag on 03/17/2021 15:35:51 Hochstein, Audrie Downs ([id number]) -------------------------------------------------------------------------------- Compression Therapy Details Patient  Name: [name], Audrie Downs Date of Service: 03/17/2021 3:00 PM Medical Record Number: [id number] Patient Account Number: [id number] Date of Birth/Sex: [birth year] (87 y.o. F) Treating RN: Donnamarie Poag Primary Care Jozalynn Noyce: Viviana Simpler Other Clinician: Referring Beckham Capistran: Viviana Simpler Treating Tuana Hoheisel/Extender: Skipper Cliche in Treatment: 21 Compression Therapy Performed for Wound Assessment: Wound #1 Right,Lateral Malleolus Performed By: Junius Argyle, RN Compression Type: Three Layer Post Procedure Diagnosis Same as Pre-procedure Electronic Signature(s) Signed: 03/17/2021 4:12:02 PM By: Donnamarie Poag Entered By: Donnamarie Poag on 03/17/2021 15:36:09 Swier, Audrie Downs ([id number]) -------------------------------------------------------------------------------- Compression Therapy Details Patient Name: Elizabeth, Downs Date of Service: 03/17/2021 3:00 PM Medical Record Number: [id number] Patient Account Number: [id number] Date of Birth/Sex: February 09, 1928 (87 y.o. F) Treating RN: Donnamarie Poag Primary Care Gearl Baratta: Viviana Simpler Other Clinician: Referring Shawnita Krizek: Viviana Simpler Treating Lometa Riggin/Extender: Skipper Cliche in Treatment: 21 Compression Therapy Performed for Wound Assessment: Wound #2 Left Calcaneus Performed By: Clinician Donnamarie Poag, RN Compression Type: Three Layer Post Procedure Diagnosis Same as Pre-procedure Electronic Signature(s) Signed: 03/17/2021 4:12:02 PM By: Donnamarie Poag Entered By: Donnamarie Poag on 03/17/2021 15:36:09 Leeman, Audrie Downs ([id number]) -------------------------------------------------------------------------------- Encounter Discharge Information Details Patient Name: Elizabeth, Downs Date of Service: 03/17/2021 3:00 PM Medical Record Number:  [id number] Patient Account Number: [id number] Date of Birth/Sex: [birth year] (87 y.o. F) Treating RN: Donnamarie Poag Primary Care Mariela Rex: Viviana Simpler Other Clinician: Referring Alexis Mizuno: Viviana Simpler Treating Analissa Bayless/Extender: Skipper Cliche in Treatment: 21 Encounter Discharge Information Items Post Procedure Vitals Discharge Condition: Stable Temperature (F): 98.1 Ambulatory Status: Walker Pulse (bpm): 68 Discharge Destination: Home Respiratory Rate (breaths/min): 16 Transportation: Private Auto Blood Pressure (mmHg): 157/69 Accompanied By: daughter Schedule Follow-up Appointment: Yes Clinical Summary of Care: Electronic Signature(s) Signed: 03/17/2021 4:12:02 PM By: Donnamarie Poag Entered By: Donnamarie Poag on 03/17/2021 15:52:03 Madl, Audrie Downs ([id number]) -------------------------------------------------------------------------------- Lower Extremity Assessment Details Patient Name: ICA, HOLFORD Date of Service: 03/17/2021 3:00 PM Medical Record Number: [id number] Patient Account Number: [id number] Date of Birth/Sex: 1927-12-13 (87 y.o. F) Treating RN: Donnamarie Poag Primary Care Nayellie Sanseverino: Viviana Simpler Other Clinician: Referring Kaleab Frasier: Viviana Simpler Treating Dmitri Pettigrew/Extender: Skipper Cliche in Treatment: 21 Edema Assessment Assessed: [Left: Yes] Patrice Paradise: Yes] [Left: Edema] [Right: :] Calf Left: Right: Point of Measurement: 33 cm From Medial Instep 21 cm 21.5 cm Ankle Left: Right: Point of Measurement: 9 cm From Medial Instep 18 cm 18 cm Knee To Floor Left: Right: From Medial Instep 38 cm 38 cm Vascular Assessment Pulses: Dorsalis Pedis Palpable: [Left:Yes] [Right:Yes] Electronic Signature(s) Signed: 03/17/2021 4:12:02 PM By: Donnamarie Poag Entered By: Donnamarie Poag on 03/17/2021 15:25:16 Applin, Audrie Downs ([id number]) -------------------------------------------------------------------------------- Multi Wound Chart Details Patient Name: [name] Date of Service: 03/17/2021 3:00 PM Medical Record Number: [id number] Patient Account Number: [id number] Date of Birth/Sex: 04-10-1928 (87 y.o. F) Treating RN: Donnamarie Poag Primary Care Ryon Layton: Viviana Simpler Other Clinician: Referring Jonaya Freshour: Viviana Simpler Treating Janisse Ghan/Extender: Skipper Cliche in Treatment: 21 Vital Signs Height(in): 66 Pulse(bpm): 59 Weight(lbs): 78 Blood Pressure(mmHg): 157/69 Body Mass Index(BMI): 12 Temperature(F): 98.1 Respiratory Rate(breaths/min): 16 Photos: [N/A:N/A] Wound Location: Right, Lateral Malleolus Left Calcaneus N/A Wounding Event: Gradually Appeared Gradually Appeared N/A Primary Etiology: Pressure Ulcer Pressure Ulcer N/A Comorbid History: Arrhythmia, Coronary Artery Arrhythmia, Coronary Artery N/A Disease, Hypertension, Peripheral Disease, Hypertension, Peripheral Arterial Disease, History of pressure Arterial Disease, History of pressure wounds, Osteoarthritis wounds, Osteoarthritis Date Acquired: 10/22/2014 07/09/2020 N/A Weeks of Treatment: 21 21 N/A Wound Status: Open Open N/A Measurements L x W x D (cm) 0.2x0.2x0.1 0.1x0.1x0.1  N/A Area (cm) : 0.031 0.008 N/A Volume (cm) : 0.003 0.001 N/A % Reduction in Area: 96.10% 99.50% N/A % Reduction in Volume: 96.20% 99.30% N/A Classification: Category/Stage III Unstageable/Unclassified N/A Exudate Amount: Medium None Present N/A Exudate Type: Serous N/A N/A Exudate Color: amber N/A N/A Wound Margin: Flat and Intact N/A N/A Granulation Amount: Large (67-100%) None Present (0%) N/A Granulation Quality: Red, Pink N/A N/A Necrotic Amount: Small (1-33%) None Present (0%) N/A Exposed Structures: Fat Layer (Subcutaneous Tissue): Fascia: No N/A Yes Fat Layer (Subcutaneous Tissue): Fascia: No No Tendon: No Tendon: No Muscle: No Muscle: No Joint: No Joint: No Bone: No Bone: No Epithelialization: Small (1-33%) Small (1-33%) N/A Treatment Notes Electronic Signature(s) Signed:  03/17/2021 4:12:02 PM By: Donnamarie Poag Entered By: Donnamarie Poag on 03/17/2021 15:28:23 [name] ([id number]) -------------------------------------------------------------------------------- [place] Details Patient Name: LOGYN, WOOLRIDGE Date of Service: 03/17/2021 3:00 PM Medical Record Number: [id number] Patient Account Number: [id number] Date of Birth/Sex: 07/06/1928 (87 y.o. F) Treating RN: Donnamarie Poag Primary Care Khian Remo: Viviana Simpler Other Clinician: Referring Indyah Saulnier: Viviana Simpler Treating Ramces Shomaker/Extender: Skipper Cliche in Treatment: 21 Active Inactive Electronic Signature(s) Signed: 03/17/2021 4:12:02 PM By: Donnamarie Poag Entered By: Donnamarie Poag on 03/17/2021 15:25:54 Bubb, Audrie Downs ([id number]) -------------------------------------------------------------------------------- Pain Assessment Details Patient Name: ERIYANNA, KIO Date of Service: 03/17/2021 3:00 PM Medical Record Number: [id number] Patient Account Number: [id number] Date of Birth/Sex: [birth year] (87 y.o. F) Treating RN: Donnamarie Poag Primary Care Kamber Vignola: Viviana Simpler Other Clinician: Referring Giankarlo Leamer: Viviana Simpler Treating Meekah Math/Extender: Skipper Cliche in Treatment: 21 Active Problems Location of Pain Severity and Description of Pain Patient Has Paino No Site Locations Rate the pain. Current Pain Level: 0 Pain Management and Medication Current Pain Management: Electronic Signature(s) Signed: 03/17/2021 4:12:02 PM By: Donnamarie Poag Entered By: Donnamarie Poag on 03/17/2021 15:12:21 [name] ([id number]) -------------------------------------------------------------------------------- Patient/Caregiver Education Details Patient Name: [name] Date of Service: 03/17/2021 3:00 PM Medical Record Number: [id number] Patient Account Number: [id number] Date of Birth/Gender: [birth year] (87 y.o. F) Treating RN: Donnamarie Poag Primary Care Physician: Viviana Simpler  Other Clinician: Referring Physician: Viviana Simpler Treating Physician/Extender: Skipper Cliche in Treatment: 21 Education Assessment Education Provided To: Patient and Caregiver Education Topics Provided Wound Debridement: Wound/Skin Impairment: Electronic Signature(s) Signed: 03/17/2021 4:12:02 PM By: Donnamarie Poag Entered By: Donnamarie Poag on 03/17/2021 15:36:52 Mikami, Audrie Downs ([id number]) -------------------------------------------------------------------------------- Wound Assessment Details Patient Name: KYLIA, BRUBAKER Date of Service: 03/17/2021 3:00 PM Medical Record Number: [id number] Patient Account Number: [id number] Date of Birth/Sex: 1928-02-28 (87 y.o. F) Treating RN: Donnamarie Poag Primary Care Meredith Mells: Viviana Simpler Other Clinician: Referring Tresa Jolley: Viviana Simpler Treating Daniil Labarge/Extender: Skipper Cliche in Treatment: 21 Wound Status Wound Number: 1 Primary Pressure Ulcer Etiology: Wound Location: Right, Lateral Malleolus Wound Open Wounding Event: Gradually Appeared Status: Date Acquired: 10/22/2014 Comorbid Arrhythmia, Coronary Artery Disease, Hypertension, Weeks Of Treatment: 21 History: Peripheral Arterial Disease, History of pressure wounds, Clustered Wound: No Osteoarthritis Photos Wound Measurements Length: (cm) 0.2 Width: (cm) 0.2 Depth: (cm) 0.1 Area: (cm) 0.031 Volume: (cm) 0.003 % Reduction in Area: 96.1% % Reduction in Volume: 96.2% Epithelialization: Small (1-33%) Tunneling: No Undermining: No Wound Description Classification: Category/Stage III Wound Margin: Flat and Intact Exudate Amount: Medium Exudate Type: Serous Exudate Color: amber Foul Odor After Cleansing: No Slough/Fibrino Yes Wound Bed Granulation Amount: Large (67-100%) Exposed Structure Granulation Quality: Red, Pink Fascia Exposed: No Necrotic Amount: Small (1-33%) Fat Layer (Subcutaneous Tissue) Exposed: Yes Necrotic Quality: Adherent Slough Tendon  Exposed: No  Muscle Exposed: No Joint Exposed: No Bone Exposed: No Treatment Notes Wound #1 (Malleolus) Wound Laterality: Right, Lateral Cleanser Soap and Water Discharge Instruction: Gently cleanse wound with antibacterial soap, rinse and pat dry prior to dressing wounds CHARLENA, RADWANSKI ([id number]) Peri-Wound Care Topical Primary Dressing Silvercel Small 2x2 (in/in) Discharge Instruction: Apply Silvercel Small 2x2 (in/in) as instructed Secondary Dressing ABD Pad 5x9 (in/in) Discharge Instruction: Cover with ABD pad-also pad upper shin with extra ABD pad Secured With Compression Wrap Profore Lite LF 3 Multilayer Compression Humphrey Discharge Instruction: Apply 3 multi-layer wrap as prescribed. Compression Stockings Add-Ons Electronic Signature(s) Signed: 03/17/2021 4:12:02 PM By: Donnamarie Poag Entered By: Donnamarie Poag on 03/17/2021 15:31:36 Gloss, Audrie Downs ([id number]) -------------------------------------------------------------------------------- Wound Assessment Details Patient Name: CHENEQUA, CHIAVERINI Date of Service: 03/17/2021 3:00 PM Medical Record Number: [id number] Patient Account Number: [id number] Date of Birth/Sex: [birth year] (87 y.o. F) Treating RN: Donnamarie Poag Primary Care Ryver Zadrozny: Viviana Simpler Other Clinician: Referring Ticara Waner: Viviana Simpler Treating Skyley Grandmaison/Extender: Skipper Cliche in Treatment: 21 Wound Status Wound Number: 2 Primary Pressure Ulcer Etiology: Wound Location: Left Calcaneus Wound Open Wounding Event: Gradually Appeared Status: Date Acquired: 07/09/2020 Comorbid Arrhythmia, Coronary Artery Disease, Hypertension, Weeks Of Treatment: 21 History: Peripheral Arterial Disease, History of pressure wounds, Clustered Wound: No Osteoarthritis Photos Wound Measurements Length: (cm) 0.1 Width: (cm) 0.1 Depth: (cm) 0.1 Area: (cm) 0.008 Volume: (cm) 0.001 % Reduction in Area: 99.5% % Reduction in Volume: 99.3% Epithelialization:  Small (1-33%) Wound Description Classification: Unstageable/Unclassified Exudate Amount: None Present Foul Odor After Cleansing: No Slough/Fibrino No Wound Bed Granulation Amount: None Present (0%) Exposed Structure Necrotic Amount: Large (67-100%) Fascia Exposed: No Necrotic Quality: Adherent Slough Fat Layer (Subcutaneous Tissue) Exposed: No Tendon Exposed: No Muscle Exposed: No Joint Exposed: No Bone Exposed: No Treatment Notes Wound #2 (Calcaneus) Wound Laterality: Left Cleanser Soap and Water Discharge Instruction: Gently cleanse wound with antibacterial soap, rinse and pat dry prior to dressing wounds Peri-Wound Care Topical EVGENIA, LUSCOMBE ([id number]) Primary Dressing Silvercel Small 2x2 (in/in) Discharge Instruction: Apply Silvercel Small 2x2 (in/in) as instructed Secondary Dressing ABD Pad 5x9 (in/in) Discharge Instruction: Cover with ABD pad Secured With Compression Wrap Profore Lite LF 3 Multilayer Compression Bandaging System Discharge Instruction: Apply 3 multi-layer wrap as prescribed. Compression Stockings Add-Ons Electronic Signature(s) Signed: 03/17/2021 4:12:02 PM By: Donnamarie Poag Entered By: Donnamarie Poag on 03/17/2021 15:32:43 Otte, Audrie Downs ([id number]) -------------------------------------------------------------------------------- [place] Details Patient Name: [name] Date of Service: 03/17/2021 3:00 PM Medical Record Number: [id number] Patient Account Number: [id number] Date of Birth/Sex: 1927/09/06 (87 y.o. F) Treating RN: Donnamarie Poag Primary Care Jaziyah Gradel: Viviana Simpler Other Clinician: Referring Rebecka Oelkers: Viviana Simpler Treating Kayloni Rocco/Extender: Skipper Cliche in Treatment: 21 Vital Signs Time Taken: 15:11 Temperature (F): 98.1 Height (in): 66 Pulse (bpm): 68 Weight (lbs): 76 Respiratory Rate (breaths/min): 16 Body Mass Index (BMI): 12.3 Blood Pressure (mmHg): 157/69 Reference Range: 80 - 120 mg / dl Electronic  Signature(s) Signed: 03/17/2021 4:12:02 PM By: Donnamarie Poag Entered ByDonnamarie Poag on 03/17/2021 15:12:11

## 2021-03-24 ENCOUNTER — Other Ambulatory Visit: Payer: Self-pay

## 2021-03-24 ENCOUNTER — Encounter: Payer: PPO | Admitting: Physician Assistant

## 2021-03-24 DIAGNOSIS — L89513 Pressure ulcer of right ankle, stage 3: Secondary | ICD-10-CM | POA: Diagnosis not present

## 2021-03-24 DIAGNOSIS — L89623 Pressure ulcer of left heel, stage 3: Secondary | ICD-10-CM | POA: Diagnosis not present

## 2021-03-24 NOTE — Progress Notes (Addendum)
GERALDYN, AZIZ ([id number]) Visit Report for 03/24/2021 Chief Complaint Document Details Patient Name: [name], [name]. Date of Service: 03/24/2021 3:00 PM Medical Record Number: [id number] Patient Account Number: [id number] Date of Birth/Sex: 04/07/1928 (87 y.o. F) Treating RN: Carlene Coria Primary Care Provider: Viviana Simpler Other Clinician: Referring Provider: Viviana Simpler Treating Provider/Extender: Skipper Cliche in Treatment: 22 Information Obtained from: Patient Chief Complaint Pressure ulcer right ankle and left heel and right ischial/buttock pressure ulcer Electronic Signature(s) Signed: 03/24/2021 3:45:46 PM By: Worthy Keeler PA-C Entered By: Worthy Keeler on 03/24/2021 15:45:46 Balkcom, [name] ([id number]) -------------------------------------------------------------------------------- HPI Details Patient Name: Elizabeth, Downs Date of Service: 03/24/2021 3:00 PM Medical Record Number: [id number] Patient Account Number: [id number] Date of Birth/Sex: 10-23-1927 (87 y.o. F) Treating RN: Carlene Coria Primary Care Provider: Viviana Simpler Other Clinician: Referring Provider: Viviana Simpler Treating Provider/Extender: Skipper Cliche in Treatment: 22 History of Present Illness HPI Description: 10/21/2020 upon evaluation today patient presents for initial inspection here in our clinic concerning issues that she has been having quite some time in regard to her ankle and since she has been in the hospital with regard to the heel. The ankle in fact has been 5-6 years at least I am told. With that being said the heel ulcer occurred when she was in the hospital in December for hip surgery when she fractured her hip. She also was in the hospital Tuesday for altered mental status. Really there was nothing that was identified as the cause for this. She did have a. Fortunately there does not appear to be any signs of infection she tells me that she has had she believes  arterial studies At [facility] clinic I could not find Those studies at this point. Nonetheless I will continue to look and see what I can find before Then. The patient also has a skin tear on her arm this occurred more recently when she bumped this at home. The patient does have a history of hypertension, coronary artery disease, and peripheral vascular disease stated. 10/28/2020 I was able to find the patient's chart currently which shows that she did have an arterial study performed in May 2019. This showed that she had a abnormal right toe brachial index and a normal left toe brachial index. She was noncompressible as far as ABIs were concerned. She did appear to have triphasic flow at that time. Unfortunately the wound that is commented on in the report that I printed off and read mentions the same wound on the ankle that we are still dealing with at this point. Unfortunately this has not healed. And its been quite sometime about 3 years now. Fortunately there does not appear to be any signs of active infection systemically at this point. I think that the patient has done well with the Santyl over the past week which is good news. Patient's caregiver which is her daughter-in-law is concerned about the fact that she really does not feel qualified to be able to change the dressings and take care of this issue. There does not appear to be any signs of anything untoward going on at this point. I think she is done a great job applying the [employer] and I think that has done a great job for the patient is for soften up some of the necrotic tissue. With that being said I think the Xeroform on the arm also has done excellent. In general I am very pleased with where we stand. And I told the patient's  daughter-in- law as well that I also feel like she has done a great job over the past week taking care of her mother. 11/11/2020 upon evaluation today patient appears to be doing well with regard to her wounds. She  has been tolerating the dressing changes without complication her daughters been applying the Santyl which has done a great job. Ho with that being said I think now that we have good arterial study showing we can go ahead and proceed with sharp debridement at this point. 11/25/2020 upon evaluation today patient appears to be doing well with regard to her wounds. The Santyl has really helped to clean things up and overall she is doing quite excellent at this point. Fortunately there does not appear to be any signs of infection which is great news and in general extremely pleased. I do believe some debridement is in order and hopefully will be able to get her into a collagen dressing after this. 12/23/2020 upon evaluation today patient appears to be doing well with regard to her wound all things considered. Unfortunately it does sound like her daughter decided to let this air out because she felt like it was getting so wet. It sounds like she got border gauze dressings instead of border foam therefore there is really Nothing to catch the excess drainage which I think is the problem they were worried about the smell. Fortunately there does not appear to be any signs of active infection which is great news. Nonetheless I do not think we want to leave this just open to air. 01/13/2021 upon evaluation today patient's wounds actually appear to be about as good as have seen since have been taking care of her. Fortunately there does not appear to be any evidence of infection which is great and overall the biggest issue I see is that of fluid buildup. I think we need to do something to try to help manage this. I think if we can control her edema we get the wounds to heal more effectively. 01/27/2021 upon evaluation today patient appears to be doing well in regard to the wounds on her right lateral malleolus as well as the left heel and the right ischial tuberosity is unfortunately a new area that has arisen since we  last saw her. She tells me currently that that is from sitting too much. With that being said this actually appears to be doing the worst of anything so far that I see today. Fortunately there does not appear to be any signs of infection this is at least good news. Compression wrap seem to be doing excellent for her as far as the legs are concerned. 02/03/2021 upon evaluation today patient appears to be doing well with regard to all of her wounds. She has been tolerating the dressing changes without complication. Fortunately there is no signs of active infection at this time. No fevers, chills, nausea, vomiting, or diarrhea. 02/10/2021 upon evaluation today patient appears to be doing well with regard to her wounds. With that being said there is some slight evidence of hypergranulation in regard to the right ankle in particular and a little bit in regard to the left heel. I think Hydrofera Blue might be a better option to go to at this point based on what I am seeing especially since both of these wounds in particular seem to be a little bit stalled. I discussed that with the patient and her daughter today. With regard to the hip this is doing great with the  collagen I think will get very close to complete closure I recommend we continue with the collagen. 02/17/2021 upon evaluation today patient appears to be doing excellent in regard to her heel ulcers. She has been tolerating the dressing changes without complication and overall I am extremely pleased with where things stand today. There does not appear to be any signs of active infection which is great news. Overall I may think that we are headed in the proper direction based on what I am seeing. 02/24/2021 upon evaluation today patient's wounds actually are showing signs of improvement in regard to her heels both are doing awesome. In regard to her hip location this is actually reopened after being closed last week and to be perfectly honest I am more  concerned here about the fact that pressures causing this issue I do not think it has anything to do with her not having the collagen in place again it was completely healed last week there was no reason for the collagen to be there. 03/03/2021 upon evaluation today patient appears to be doing well with regard to her wounds. Everything is showing signs of improvement and ZULIANA, DUPONT. ([id number]) doing some much better as far as the overall size of the wound. Fortunately I think we are headed in the right direction. 03/10/2021 upon evaluation today patient appears to be doing well with regard to her wound. She is tolerating the dressing changes without complication. The left heel pretty much appears to be almost completely healed although I think it is probably can be 1 more week before I can call this 100% slough. The right ankle is significantly improved with that being said its not completely closed. With regard to the right hip this is completely closed. Overall I am very pleased with where things stand I think were getting very close to complete resolution. I discussed all this with the patient and her daughter-in-law today. 03/17/2021 upon evaluation today patient appears to be doing well with regard to her wounds. Fortunately there does not appear to be any signs of active infection which is great news and overall very pleased with where the patient stands today. I am in general thankful that overall she is making great progress here. There is good to be a little bit of debridement here to clear away some of the necrotic debris today on both wound locations. 03/24/2021 upon evaluation today patient appears to be doing excellent in regard to her wound. She has been tolerating the dressing changes without complication. Fortunately there does not appear to be any evidence of infection the left foot is completely healed this is great news. On the right at the ankle region this is still open though I  do not think the alginate did quite as well as I was hoping as far as getting this dried out. I think that we may just want to switch back to the [employer] which has done well up to this point. I was just hopeful this would wrap things up completely for [place]. Electronic Signature(s) Signed: 03/24/2021 4:35:49 PM By: Worthy Keeler PA-C Entered By: Worthy Keeler on 03/24/2021 16:35:49 Badman, [name] ([id number]) -------------------------------------------------------------------------------- Physical Exam Details Patient Name: ANAIIS, WIDDOWSON Date of Service: 03/24/2021 3:00 PM Medical Record Number: [id number] Patient Account Number: [id number] Date of Birth/Sex: 24-Oct-1927 (87 y.o. F) Treating RN: Carlene Coria Primary Care Provider: Viviana Simpler Other Clinician: Referring Provider: Viviana Simpler Treating Provider/Extender: Skipper Cliche in Treatment: 61 Constitutional Well-nourished and well-hydrated  in no acute distress. Respiratory normal breathing without difficulty. Psychiatric this patient is able to make decisions and demonstrates good insight into disease process. Alert and Oriented x 3. pleasant and cooperative. Notes Upon inspection patient's wound bed actually showed signs of good granulation epithelization at this point. Fortunately there does not appear to be any evidence of infection which is great news and overall I am extremely pleased with where things stand today. No fevers, chills, nausea, vomiting, or diarrhea. Electronic Signature(s) Signed: 03/24/2021 4:36:15 PM By: Worthy Keeler PA-C Entered By: Worthy Keeler on 03/24/2021 16:36:15 Rumer, [name] ([id number]) -------------------------------------------------------------------------------- Physician Orders Details Patient Name: ASHLAN, GEPPERT Date of Service: 03/24/2021 3:00 PM Medical Record Number: [id number] Patient Account Number: [id number] Date of Birth/Sex: 1927-11-11 (87 y.o.  F) Treating RN: Carlene Coria Primary Care Provider: Viviana Simpler Other Clinician: Referring Provider: Viviana Simpler Treating Provider/Extender: Skipper Cliche in Treatment: 22 Verbal / Phone Orders: No Diagnosis Coding ICD-10 Coding Code Description L89.513 Pressure ulcer of right ankle, stage 3 L89.623 Pressure ulcer of left heel, stage 3 S51.802A Unspecified open wound of left forearm, initial encounter L89.312 Pressure ulcer of right buttock, stage 2 I10 Essential (primary) hypertension I25.10 Atherosclerotic heart disease of native coronary artery without angina pectoris I73.89 Other specified peripheral vascular diseases Follow-up Appointments o Return Appointment in 1 week. o Nurse Visit as needed Bathing/ Shower/ Hygiene o May shower with wound dressing protected with water repellent cover or cast protector. o No tub bath. Edema Control - Lymphedema / Segmental Compressive Device / Other Bilateral Lower Extremities o Optional: One layer of unna paste to top of compression wrap (to act as an anchor). o Tubigrip single layer applied. - left lower leg size C o Elevate legs to the level of the heart and pump ankles as often as possible o Elevate leg(s) parallel to the floor when sitting. o DO YOUR BEST to sleep in the bed at night. DO NOT sleep in your recliner. Long hours of sitting in a recliner leads to swelling of the legs and/or potential wounds on your backside. Off-Loading o Gel wheelchair cushion - prop when sitting or laying to avoid pressure on your hip wound o Turn and reposition every 2 hours o Other: - Border foam dressing for protection to right hip Wound Treatment Wound #1 - Malleolus Wound Laterality: Right, Lateral Cleanser: Soap and Water 1 x Per Week/30 Days Discharge Instructions: Gently cleanse wound with antibacterial soap, rinse and pat dry prior to dressing wounds Primary Dressing: Hydrofera Blue Ready Transfer Foam,  2.5x2.5 (in/in) 1 x Per Week/30 Days Discharge Instructions: Apply Hydrofera Blue Ready to wound bed as directed Secondary Dressing: Gauze 1 x Per Week/30 Days Discharge Instructions: As directed: dry, moistened with saline or moistened with Dakins Solution Compression Wrap: Profore Lite LF 3 Multilayer Compression Bandaging System 1 x Per Week/30 Days Discharge Instructions: Apply 3 multi-layer wrap as prescribed. Electronic Signature(s) Signed: 03/24/2021 5:37:36 PM By: Worthy Keeler PA-C Signed: 03/27/2021 7:55:24 AM By: Carlene Coria RN Entered By: Carlene Coria on 03/24/2021 16:05:56 Magan, [name] ([id number]) UMLAND, [name] ([id number]) -------------------------------------------------------------------------------- Problem List Details Patient Name: SAMMANTHA, RABIDEAU Date of Service: 03/24/2021 3:00 PM Medical Record Number: [id number] Patient Account Number: [id number] Date of Birth/Sex: 1928-03-15 (87 y.o. F) Treating RN: Carlene Coria Primary Care Provider: Viviana Simpler Other Clinician: Referring Provider: Viviana Simpler Treating Provider/Extender: Skipper Cliche in Treatment: 22 Active Problems ICD-10 Encounter Code Description Active Date MDM Diagnosis L89.513 Pressure ulcer  of right ankle, stage 3 10/21/2020 No Yes L89.623 Pressure ulcer of left heel, stage 3 10/21/2020 No Yes S51.802A Unspecified open wound of left forearm, initial encounter 10/21/2020 No Yes L89.312 Pressure ulcer of right buttock, stage 2 01/27/2021 No Yes I10 Essential (primary) hypertension 10/21/2020 No Yes I25.10 Atherosclerotic heart disease of native coronary artery without angina 10/21/2020 No Yes pectoris I73.89 Other specified peripheral vascular diseases 10/21/2020 No Yes Inactive Problems Resolved Problems Electronic Signature(s) Signed: 03/24/2021 3:45:39 PM By: Worthy Keeler PA-C Entered By: Worthy Keeler on 03/24/2021 15:45:39 Pagliuca, [name]  ([id number]) -------------------------------------------------------------------------------- Progress Note Details Patient Name: [name] Date of Service: 03/24/2021 3:00 PM Medical Record Number: [id number] Patient Account Number: [id number] Date of Birth/Sex: 10-21-1927 (87 y.o. F) Treating RN: Carlene Coria Primary Care Provider: Viviana Simpler Other Clinician: Referring Provider: Viviana Simpler Treating Provider/Extender: Skipper Cliche in Treatment: 22 Subjective Chief Complaint Information obtained from Patient Pressure ulcer right ankle and left heel and right ischial/buttock pressure ulcer History of Present Illness (HPI) 10/21/2020 upon evaluation today patient presents for initial inspection here in our clinic concerning issues that she has been having quite some time in regard to her ankle and since she has been in the hospital with regard to the heel. The ankle in fact has been 5-6 years at least I am told. With that being said the heel ulcer occurred when she was in the hospital in December for hip surgery when she fractured her hip. She also was in the hospital Tuesday for altered mental status. Really there was nothing that was identified as the cause for this. She did have a. Fortunately there does not appear to be any signs of infection she tells me that she has had she believes arterial studies At [facility] clinic I could not find Those studies at this point. Nonetheless I will continue to look and see what I can find before Then. The patient also has a skin tear on her arm this occurred more recently when she bumped this at home. The patient does have a history of hypertension, coronary artery disease, and peripheral vascular disease stated. 10/28/2020 I was able to find the patient's chart currently which shows that she did have an arterial study performed in May 2019. This showed that she had a abnormal right toe brachial index and a normal left toe brachial  index. She was noncompressible as far as ABIs were concerned. She did appear to have triphasic flow at that time. Unfortunately the wound that is commented on in the report that I printed off and read mentions the same wound on the ankle that we are still dealing with at this point. Unfortunately this has not healed. And its been quite sometime about 3 years now. Fortunately there does not appear to be any signs of active infection systemically at this point. I think that the patient has done well with the Santyl over the past week which is good news. Patient's caregiver which is her daughter-in-law is concerned about the fact that she really does not feel qualified to be able to change the dressings and take care of this issue. There does not appear to be any signs of anything untoward going on at this point. I think she is done a great job applying the [employer] and I think that has done a great job for the patient is for soften up some of the necrotic tissue. With that being said I think the Xeroform on the arm also has  done excellent. In general I am very pleased with where we stand. And I told the patient's daughter-in- law as well that I also feel like she has done a great job over the past week taking care of her mother. 11/11/2020 upon evaluation today patient appears to be doing well with regard to her wounds. She has been tolerating the dressing changes without complication her daughters been applying the Santyl which has done a great job. Ho with that being said I think now that we have good arterial study showing we can go ahead and proceed with sharp debridement at this point. 11/25/2020 upon evaluation today patient appears to be doing well with regard to her wounds. The Santyl has really helped to clean things up and overall she is doing quite excellent at this point. Fortunately there does not appear to be any signs of infection which is great news and in general extremely pleased. I do  believe some debridement is in order and hopefully will be able to get her into a collagen dressing after this. 12/23/2020 upon evaluation today patient appears to be doing well with regard to her wound all things considered. Unfortunately it does sound like her daughter decided to let this air out because she felt like it was getting so wet. It sounds like she got border gauze dressings instead of border foam therefore there is really Nothing to catch the excess drainage which I think is the problem they were worried about the smell. Fortunately there does not appear to be any signs of active infection which is great news. Nonetheless I do not think we want to leave this just open to air. 01/13/2021 upon evaluation today patient's wounds actually appear to be about as good as have seen since have been taking care of her. Fortunately there does not appear to be any evidence of infection which is great and overall the biggest issue I see is that of fluid buildup. I think we need to do something to try to help manage this. I think if we can control her edema we get the wounds to heal more effectively. 01/27/2021 upon evaluation today patient appears to be doing well in regard to the wounds on her right lateral malleolus as well as the left heel and the right ischial tuberosity is unfortunately a new area that has arisen since we last saw her. She tells me currently that that is from sitting too much. With that being said this actually appears to be doing the worst of anything so far that I see today. Fortunately there does not appear to be any signs of infection this is at least good news. Compression wrap seem to be doing excellent for her as far as the legs are concerned. 02/03/2021 upon evaluation today patient appears to be doing well with regard to all of her wounds. She has been tolerating the dressing changes without complication. Fortunately there is no signs of active infection at this time. No  fevers, chills, nausea, vomiting, or diarrhea. 02/10/2021 upon evaluation today patient appears to be doing well with regard to her wounds. With that being said there is some slight evidence of hypergranulation in regard to the right ankle in particular and a little bit in regard to the left heel. I think Hydrofera Blue might be a better option to go to at this point based on what I am seeing especially since both of these wounds in particular seem to be a little bit stalled. I discussed that with  the patient and her daughter today. With regard to the hip this is doing great with the collagen I think will get very close to complete closure I recommend we continue with the collagen. 02/17/2021 upon evaluation today patient appears to be doing excellent in regard to her heel ulcers. She has been tolerating the dressing changes without complication and overall I am extremely pleased with where things stand today. There does not appear to be any signs of active infection which is great news. Overall I may think that we are headed in the proper direction based on what I am seeing. 02/24/2021 upon evaluation today patient's wounds actually are showing signs of improvement in regard to her heels both are doing awesome. In [place], [place] ([id number]) regard to her hip location this is actually reopened after being closed last week and to be perfectly honest I am more concerned here about the fact that pressures causing this issue I do not think it has anything to do with her not having the collagen in place again it was completely healed last week there was no reason for the collagen to be there. 03/03/2021 upon evaluation today patient appears to be doing well with regard to her wounds. Everything is showing signs of improvement and doing some much better as far as the overall size of the wound. Fortunately I think we are headed in the right direction. 03/10/2021 upon evaluation today patient appears to be doing  well with regard to her wound. She is tolerating the dressing changes without complication. The left heel pretty much appears to be almost completely healed although I think it is probably can be 1 more week before I can call this 100% slough. The right ankle is significantly improved with that being said its not completely closed. With regard to the right hip this is completely closed. Overall I am very pleased with where things stand I think were getting very close to complete resolution. I discussed all this with the patient and her daughter-in-law today. 03/17/2021 upon evaluation today patient appears to be doing well with regard to her wounds. Fortunately there does not appear to be any signs of active infection which is great news and overall very pleased with where the patient stands today. I am in general thankful that overall she is making great progress here. There is good to be a little bit of debridement here to clear away some of the necrotic debris today on both wound locations. 03/24/2021 upon evaluation today patient appears to be doing excellent in regard to her wound. She has been tolerating the dressing changes without complication. Fortunately there does not appear to be any evidence of infection the left foot is completely healed this is great news. On the right at the ankle region this is still open though I do not think the alginate did quite as well as I was hoping as far as getting this dried out. I think that we may just want to switch back to the [facility] which has done well up to this point. I was just hopeful this would wrap things up completely for [place]. Objective Constitutional Well-nourished and well-hydrated in no acute distress. Vitals Time Taken: 3:41 PM, Height: 66 in, Weight: 76 lbs, BMI: 12.3, Temperature: 97.8 F, Pulse: 64 bpm, Respiratory Rate: 16 breaths/min, Blood Pressure: 144/74 mmHg. Respiratory normal breathing without  difficulty. Psychiatric this patient is able to make decisions and demonstrates good insight into disease process. Alert and Oriented x 3. pleasant and  cooperative. General Notes: Upon inspection patient's wound bed actually showed signs of good granulation epithelization at this point. Fortunately there does not appear to be any evidence of infection which is great news and overall I am extremely pleased with where things stand today. No fevers, chills, nausea, vomiting, or diarrhea. Integumentary (Hair, Skin) Wound #1 status is Open. Original cause of wound was Gradually Appeared. The date acquired was: 10/22/2014. The wound has been in treatment 22 weeks. The wound is located on the Right,Lateral Malleolus. The wound measures 0.2cm length x 0.2cm width x 0.1cm depth; 0.031cm^2 area and 0.003cm^3 volume. There is Fat Layer (Subcutaneous Tissue) exposed. There is no tunneling or undermining noted. There is a medium amount of serous drainage noted. The wound margin is flat and intact. There is large (67-100%) red, pink granulation within the wound bed. There is a small (1-33%) amount of necrotic tissue within the wound bed including Adherent Slough. Wound #2 status is Open. Original cause of wound was Gradually Appeared. The date acquired was: 07/09/2020. The wound has been in treatment 22 weeks. The wound is located on the Left Calcaneus. The wound measures 0cm length x 0cm width x 0cm depth; 0cm^2 area and 0cm^3 volume. There is no tunneling or undermining noted. There is a none present amount of drainage noted. There is no granulation within the wound bed. There is no necrotic tissue within the wound bed. Assessment Active Problems ICD-10 Pressure ulcer of right ankle, stage 3 Pressure ulcer of left heel, stage 3 Unspecified open wound of left forearm, initial encounter Pressure ulcer of right buttock, stage 2 Essential (primary) hypertension CYLAH, FRIEDT ([id number]) Atherosclerotic  heart disease of native coronary artery without angina pectoris Other specified peripheral vascular diseases Procedures Wound #1 Pre-procedure diagnosis of Wound #1 is a Pressure Ulcer located on the Right,Lateral Malleolus . There was a Three Layer Compression Therapy Procedure by Carlene Coria, RN. Post procedure Diagnosis Wound #1: Same as Pre-Procedure Plan Follow-up Appointments: Return Appointment in 1 week. Nurse Visit as needed Bathing/ Shower/ Hygiene: May shower with wound dressing protected with water repellent cover or cast protector. No tub bath. Edema Control - Lymphedema / Segmental Compressive Device / Other: Optional: One layer of unna paste to top of compression wrap (to act as an anchor). Tubigrip single layer applied. - left lower leg size C Elevate legs to the level of the heart and pump ankles as often as possible Elevate leg(s) parallel to the floor when sitting. DO YOUR BEST to sleep in the bed at night. DO NOT sleep in your recliner. Long hours of sitting in a recliner leads to swelling of the legs and/or potential wounds on your backside. Off-Loading: Gel wheelchair cushion - prop when sitting or laying to avoid pressure on your hip wound Turn and reposition every 2 hours Other: - Border foam dressing for protection to right hip WOUND #1: - Malleolus Wound Laterality: Right, Lateral Cleanser: Soap and Water 1 x Per Week/30 Days Discharge Instructions: Gently cleanse wound with antibacterial soap, rinse and pat dry prior to dressing wounds Primary Dressing: Hydrofera Blue Ready Transfer Foam, 2.5x2.5 (in/in) 1 x Per Week/30 Days Discharge Instructions: Apply Hydrofera Blue Ready to wound bed as directed Secondary Dressing: Gauze 1 x Per Week/30 Days Discharge Instructions: As directed: dry, moistened with saline or moistened with Dakins Solution Compression Wrap: Profore Lite LF 3 Multilayer Compression Bandaging System 1 x Per Week/30 Days Discharge  Instructions: Apply 3 multi-layer wrap as prescribed. 1. Would recommend  currently that we go ahead and switch back to the [facility] I think this is probably can be the best option and the patient and her daughter-in-law are in agreement with the plan. 2. We will get a continue to wrap the right leg and I think this is going to do all some for her. In regard to the left leg she does need to get the compression socks for now we will get a use Tubigrip. 3. I am also can recommend the patient should continue to elevate her legs obviously I think this is still a good thing to do. We will see patient back for reevaluation in 1 week here in the clinic. If anything worsens or changes patient will contact our office for additional recommendations. Electronic Signature(s) Signed: 03/24/2021 4:36:45 PM By: Worthy Keeler PA-C Entered By: Worthy Keeler on 03/24/2021 16:36:45 Luzader, [name] ([id number]) -------------------------------------------------------------------------------- SuperBill Details Patient Name: TALISHIA, SAUER Date of Service: 03/24/2021 Medical Record Number: [id number] Patient Account Number: [id number] Date of Birth/Sex: Aug 31, 1927 (87 y.o. F) Treating RN: Carlene Coria Primary Care Provider: Viviana Simpler Other Clinician: Referring Provider: Viviana Simpler Treating Provider/Extender: Skipper Cliche in Treatment: 22 Diagnosis Coding ICD-10 Codes Code Description L89.513 Pressure ulcer of right ankle, stage 3 L89.623 Pressure ulcer of left heel, stage 3 S51.802A Unspecified open wound of left forearm, initial encounter L89.312 Pressure ulcer of right buttock, stage 2 I10 Essential (primary) hypertension I25.10 Atherosclerotic heart disease of native coronary artery without angina pectoris I73.89 Other specified peripheral vascular diseases Facility Procedures CPT4 Code: [id number] Description: (Facility Use Only) [phone] - [place] LWR RT  LEG Modifier: Quantity: 1 Physician Procedures CPT4 Code: [id number] Description: 99213 - WC PHYS LEVEL 3 - EST PT Modifier: Quantity: 1 CPT4 Code: Description: ICD-10 Diagnosis Description L89.513 Pressure ulcer of right ankle, stage 3 L89.623 Pressure ulcer of left heel, stage 3 I25.10 Atherosclerotic heart disease of native coronary artery without angina I73.89 Other specified peripheral vascular  diseases Modifier: pectoris Quantity: Electronic Signature(s) Signed: 03/24/2021 4:37:17 PM By: Worthy Keeler PA-C Previous Signature: 03/24/2021 4:07:21 PM Version By: Carlene Coria RN Entered By: Worthy Keeler on 03/24/2021 16:37:17

## 2021-03-27 NOTE — Progress Notes (Signed)
Elizabeth, Downs ([id number]) Visit Report for 03/24/2021 Arrival Information Details Patient Name: [name], [name] Date of Service: 03/24/2021 3:00 PM Medical Record Number: [id number] Patient Account Number: [id number] Date of Birth/Sex: 02/07/1928 (87 y.o. F) Treating RN: Carlene Coria Primary Care Zaire Levesque: Viviana Simpler Other Clinician: Referring Amrutha Avera: Viviana Simpler Treating Danna Casella/Extender: Skipper Cliche in Treatment: 8 Visit Information History Since Last Visit All ordered tests and consults were completed: No Patient Arrived: [name] Added or deleted any medications: No Arrival Time: 15:32 Any new allergies or adverse reactions: No Accompanied By: daughter Had a fall or experienced change in No Transfer Assistance: None activities of daily living that may affect Patient Identification Verified: Yes risk of falls: Secondary Verification Process Completed: Yes Signs or symptoms of abuse/neglect since last visito No Patient Requires Transmission-Based Precautions: No Hospitalized since last visit: No Patient Has Alerts: Yes Implantable device outside of the clinic excluding No Patient Alerts: NOT DIABETIC cellular tissue based products placed in the center TBI left .77 since last visit: TBI right .76 Has Dressing in Place as Prescribed: Yes Has Compression in Place as Prescribed: Yes Pain Present Now: No Electronic Signature(s) Signed: 03/27/2021 7:55:24 AM By: Carlene Coria RN Entered By: Carlene Coria on 03/24/2021 15:33:40 Sica, [name] ([id number]) -------------------------------------------------------------------------------- Clinic Level of Care Assessment Details Patient Name: Elizabeth, Downs Date of Service: 03/24/2021 3:00 PM Medical Record Number: [id number] Patient Account Number: [id number] Date of Birth/Sex: 1928/05/30 (87 y.o. F) Treating RN: Carlene Coria Primary Care Maddix Kliewer: Viviana Simpler Other Clinician: Referring Creedence Heiss: Viviana Simpler Treating Takiyah Bohnsack/Extender: Skipper Cliche in Treatment: 22 Clinic Level of Care Assessment Items TOOL 1 Quantity Score '[]'$  - Use when EandM and Procedure is performed on INITIAL visit 0 ASSESSMENTS - Nursing Assessment / Reassessment '[]'$  - General Physical Exam (combine w/ comprehensive assessment (listed just below) when performed on new 0 pt. evals) '[]'$  - 0 Comprehensive Assessment (HX, ROS, Risk Assessments, Wounds Hx, etc.) ASSESSMENTS - Wound and Skin Assessment / Reassessment '[]'$  - Dermatologic / Skin Assessment (not related to wound area) 0 ASSESSMENTS - Ostomy and/or Continence Assessment and Care '[]'$  - Incontinence Assessment and Management 0 '[]'$  - 0 Ostomy Care Assessment and Management (repouching, etc.) PROCESS - Coordination of Care '[]'$  - Simple Patient / Family Education for ongoing care 0 '[]'$  - 0 Complex (extensive) Patient / Family Education for ongoing care '[]'$  - 0 Staff obtains Programmer, systems, Records, Test Results / Process Orders '[]'$  - 0 Staff telephones HHA, Nursing Homes / Clarify orders / etc '[]'$  - 0 Routine Transfer to another Facility (non-emergent condition) '[]'$  - 0 Routine Hospital Admission (non-emergent condition) '[]'$  - 0 New Admissions / Biomedical engineer / Ordering NPWT, Apligraf, etc. '[]'$  - 0 Emergency Hospital Admission (emergent condition) PROCESS - Special Needs '[]'$  - Pediatric / Minor Patient Management 0 '[]'$  - 0 Isolation Patient Management '[]'$  - 0 Hearing / Language / Visual special needs '[]'$  - 0 Assessment of Community assistance (transportation, D/C planning, etc.) '[]'$  - 0 Additional assistance / Altered mentation '[]'$  - 0 Support Surface(s) Assessment (bed, cushion, seat, etc.) INTERVENTIONS - Miscellaneous '[]'$  - External ear exam 0 '[]'$  - 0 Patient Transfer (multiple staff / Civil Service fast streamer / Similar devices) '[]'$  - 0 Simple Staple / Suture removal (25 or less) '[]'$  - 0 Complex Staple / Suture removal (26 or more) '[]'$  - 0 Hypo/Hyperglycemic  Management (do not check if billed separately) '[]'$  - 0 Ankle / Brachial Index (ABI) - do not check if billed separately Has  the patient been seen at the hospital within the last three years: Yes Total Score: 0 Level Of Care: ____ [name] ([id number]) Electronic Signature(s) Signed: 03/27/2021 7:55:24 AM By: Carlene Coria RN Entered By: Carlene Coria on 03/24/2021 16:07:07 [name] ([id number]) -------------------------------------------------------------------------------- Compression Therapy Details Patient Name: Elizabeth, Downs Date of Service: 03/24/2021 3:00 PM Medical Record Number: [id number] Patient Account Number: [id number] Date of Birth/Sex: [birth year] (87 y.o. F) Treating RN: Carlene Coria Primary Care Cameron Schwinn: Viviana Simpler Other Clinician: Referring Legacie Dillingham: Viviana Simpler Treating Tyriq Moragne/Extender: Skipper Cliche in Treatment: 22 Compression Therapy Performed for Wound Assessment: Wound #1 Right,Lateral Malleolus Performed By: Jake Church, RN Compression Type: Three Layer Post Procedure Diagnosis Same as Pre-procedure Electronic Signature(s) Signed: 03/27/2021 7:55:24 AM By: Carlene Coria RN Entered By: Carlene Coria on 03/24/2021 15:51:08 [name] ([id number]) -------------------------------------------------------------------------------- Encounter Discharge Information Details Patient Name: Elizabeth, Downs Date of Service: 03/24/2021 3:00 PM Medical Record Number: [id number] Patient Account Number: [id number] Date of Birth/Sex: Mar 04, 1928 (87 y.o. F) Treating RN: Carlene Coria Primary Care Asmaa Tirpak: Viviana Simpler Other Clinician: Referring Spiros Greenfeld: Viviana Simpler Treating Rodriguez Aguinaldo/Extender: Skipper Cliche in Treatment: 22 Encounter Discharge Information Items Discharge Condition: Stable Ambulatory Status: Walker Discharge Destination: Home Transportation: Private Auto Accompanied By: daughter Schedule Follow-up  Appointment: Yes Clinical Summary of Care: Patient Declined Electronic Signature(s) Signed: 03/24/2021 4:08:40 PM By: Carlene Coria RN Entered By: Carlene Coria on 03/24/2021 16:08:39 [name] ([id number]) -------------------------------------------------------------------------------- Lower Extremity Assessment Details Patient Name: SHALYNN, MANALAC Date of Service: 03/24/2021 3:00 PM Medical Record Number: [id number] Patient Account Number: [id number] Date of Birth/Sex: 06/23/1928 (87 y.o. F) Treating RN: Carlene Coria Primary Care Jenascia Bumpass: Viviana Simpler Other Clinician: Referring Carlos Quackenbush: Viviana Simpler Treating Adaira Centola/Extender: Skipper Cliche in Treatment: 22 Edema Assessment Assessed: [Left: Yes] Patrice Paradise: Yes] [Left: Edema] [Right: :] Calf Left: Right: Point of Measurement: 33 cm From Medial Instep 22 cm 22 cm Ankle Left: Right: Point of Measurement: 9 cm From Medial Instep 15 cm 15.5 cm Knee To Floor Left: Right: From Medial Instep 38 cm 38 cm Vascular Assessment Pulses: Dorsalis Pedis Palpable: [Left:Yes] [Right:Yes] Electronic Signature(s) Signed: 03/27/2021 7:55:24 AM By: Carlene Coria RN Entered By: Carlene Coria on 03/24/2021 15:45:01 Carew, [name] ([id number]) -------------------------------------------------------------------------------- Multi Wound Chart Details Patient Name: [name] Date of Service: 03/24/2021 3:00 PM Medical Record Number: [id number] Patient Account Number: [id number] Date of Birth/Sex: [birth year] (87 y.o. F) Treating RN: Carlene Coria Primary Care Sharifah Champine: Viviana Simpler Other Clinician: Referring Klare Criss: Viviana Simpler Treating Johnmatthew Solorio/Extender: Skipper Cliche in Treatment: 22 Vital Signs Height(in): 66 Pulse(bpm): 71 Weight(lbs): 17 Blood Pressure(mmHg): 144/74 Body Mass Index(BMI): 12 Temperature(F): 97.8 Respiratory Rate(breaths/min): 16 Photos: [N/A:N/A] Wound Location: Right, Lateral Malleolus  Left Calcaneus N/A Wounding Event: Gradually Appeared Gradually Appeared N/A Primary Etiology: Pressure Ulcer Pressure Ulcer N/A Comorbid History: Arrhythmia, Coronary Artery Arrhythmia, Coronary Artery N/A Disease, Hypertension, Peripheral Disease, Hypertension, Peripheral Arterial Disease, History of pressure Arterial Disease, History of pressure wounds, Osteoarthritis wounds, Osteoarthritis Date Acquired: 10/22/2014 07/09/2020 N/A Weeks of Treatment: 22 22 N/A Wound Status: Open Open N/A Measurements L x W x D (cm) 0.2x0.2x0.1 0x0x0 N/A Area (cm) : 0.031 0 N/A Volume (cm) : 0.003 0 N/A % Reduction in Area: 96.10% 100.00% N/A % Reduction in Volume: 96.20% 100.00% N/A Classification: Category/Stage III Unstageable/Unclassified N/A Exudate Amount: Medium None Present N/A Exudate Type: Serous N/A N/A Exudate Color: amber N/A N/A Wound Margin: Flat and Intact N/A N/A Granulation Amount: Large (67-100%) None Present (  0%) N/A Granulation Quality: Red, Pink N/A N/A Necrotic Amount: Small (1-33%) None Present (0%) N/A Exposed Structures: Fat Layer (Subcutaneous Tissue): Fascia: No N/A Yes Fat Layer (Subcutaneous Tissue): Fascia: No No Tendon: No Tendon: No Muscle: No Muscle: No Joint: No Joint: No Bone: No Bone: No Epithelialization: Small (1-33%) Large (67-100%) N/A Treatment Notes Electronic Signature(s) Signed: 03/27/2021 7:55:24 AM By: Carlene Coria RN Entered By: Carlene Coria on 03/24/2021 15:49:42 Mom, [name] ([id number]) -------------------------------------------------------------------------------- [place] Details Patient Name: KYNZLEY, VEGTER Date of Service: 03/24/2021 3:00 PM Medical Record Number: [id number] Patient Account Number: [id number] Date of Birth/Sex: 04-Nov-1927 (87 y.o. F) Treating RN: Carlene Coria Primary Care Ed Mandich: Viviana Simpler Other Clinician: Referring Kaoir Loree: Viviana Simpler Treating Asra Gambrel/Extender: Skipper Cliche in Treatment: 22 Active Inactive Electronic Signature(s) Signed: 03/27/2021 7:55:24 AM By: Carlene Coria RN Entered By: Carlene Coria on 03/24/2021 15:49:31 Hightower, [name] ([id number]) -------------------------------------------------------------------------------- Pain Assessment Details Patient Name: JIMMI, MUHLBAUER Date of Service: 03/24/2021 3:00 PM Medical Record Number: [id number] Patient Account Number: [id number] Date of Birth/Sex: [birth year] (87 y.o. F) Treating RN: Carlene Coria Primary Care Miamor Ayler: Viviana Simpler Other Clinician: Referring Silena Wyss: Viviana Simpler Treating Taher Vannote/Extender: Skipper Cliche in Treatment: 22 Active Problems Location of Pain Severity and Description of Pain Patient Has Paino No Site Locations Pain Management and Medication Current Pain Management: Electronic Signature(s) Signed: 03/27/2021 7:55:24 AM By: Carlene Coria RN Entered By: Carlene Coria on 03/24/2021 15:42:25 Cronce, [name] ([id number]) -------------------------------------------------------------------------------- Patient/Caregiver Education Details Patient Name: SHAVNA, GERRY Date of Service: 03/24/2021 3:00 PM Medical Record Number: [id number] Patient Account Number: [id number] Date of Birth/Gender: [birth year] (87 y.o. F) Treating RN: Carlene Coria Primary Care Physician: Viviana Simpler Other Clinician: Referring Physician: Viviana Simpler Treating Physician/Extender: Skipper Cliche in Treatment: 22 Education Assessment Education Provided To: Patient Education Topics Provided Wound/Skin Impairment: Methods: Explain/Verbal Responses: State content correctly Electronic Signature(s) Signed: 03/27/2021 7:55:24 AM By: Carlene Coria RN Entered By: Carlene Coria on 03/24/2021 16:07:33 Tarver, [name] ([id number]) -------------------------------------------------------------------------------- Wound Assessment Details Patient Name: CHIZARAM, BIGG Date of  Service: 03/24/2021 3:00 PM Medical Record Number: [id number] Patient Account Number: [id number] Date of Birth/Sex: 1927-12-02 (87 y.o. F) Treating RN: Carlene Coria Primary Care Siddarth Hsiung: Viviana Simpler Other Clinician: Referring Sharla Tankard: Viviana Simpler Treating Jeymi Hepp/Extender: Skipper Cliche in Treatment: 22 Wound Status Wound Number: 1 Primary Pressure Ulcer Etiology: Wound Location: Right, Lateral Malleolus Wound Open Wounding Event: Gradually Appeared Status: Date Acquired: 10/22/2014 Comorbid Arrhythmia, Coronary Artery Disease, Hypertension, Weeks Of Treatment: 22 History: Peripheral Arterial Disease, History of pressure wounds, Clustered Wound: No Osteoarthritis Photos Wound Measurements Length: (cm) 0.2 Width: (cm) 0.2 Depth: (cm) 0.1 Area: (cm) 0.031 Volume: (cm) 0.003 % Reduction in Area: 96.1% % Reduction in Volume: 96.2% Epithelialization: Small (1-33%) Tunneling: No Undermining: No Wound Description Classification: Category/Stage III Wound Margin: Flat and Intact Exudate Amount: Medium Exudate Type: Serous Exudate Color: amber Foul Odor After Cleansing: No Slough/Fibrino Yes Wound Bed Granulation Amount: Large (67-100%) Exposed Structure Granulation Quality: Red, Pink Fascia Exposed: No Necrotic Amount: Small (1-33%) Fat Layer (Subcutaneous Tissue) Exposed: Yes Necrotic Quality: Adherent Slough Tendon Exposed: No Muscle Exposed: No Joint Exposed: No Bone Exposed: No Treatment Notes Wound #1 (Malleolus) Wound Laterality: Right, Lateral Cleanser Soap and Water Discharge Instruction: Gently cleanse wound with antibacterial soap, rinse and pat dry prior to dressing wounds TILDEN, INZER ([id number]) Peri-Wound Care Topical Primary Dressing Hydrofera Blue Ready Transfer Foam, 2.5x2.5 (in/in) Discharge Instruction: Apply Hydrofera Blue Ready to wound bed  as directed Secondary Dressing Gauze Discharge Instruction: As directed: dry,  moistened with saline or moistened with Dakins Solution Secured With Compression Wrap Profore Lite LF 3 Multilayer Compression Bandaging System Discharge Instruction: Apply 3 multi-layer wrap as prescribed. Compression Stockings Add-Ons Electronic Signature(s) Signed: 03/27/2021 7:55:24 AM By: Carlene Coria RN Entered By: Carlene Coria on 03/24/2021 15:43:20 Russum, [name] ([id number]) -------------------------------------------------------------------------------- Wound Assessment Details Patient Name: CAMBRY, DENG Date of Service: 03/24/2021 3:00 PM Medical Record Number: [id number] Patient Account Number: [id number] Date of Birth/Sex: 1928-03-15 (87 y.o. F) Treating RN: Carlene Coria Primary Care Gearline Spilman: Viviana Simpler Other Clinician: Referring Ziyan Hillmer: Viviana Simpler Treating Janeice Stegall/Extender: Skipper Cliche in Treatment: 22 Wound Status Wound Number: 2 Primary Pressure Ulcer Etiology: Wound Location: Left Calcaneus Wound Open Wounding Event: Gradually Appeared Status: Date Acquired: 07/09/2020 Comorbid Arrhythmia, Coronary Artery Disease, Hypertension, Weeks Of Treatment: 22 History: Peripheral Arterial Disease, History of pressure wounds, Clustered Wound: No Osteoarthritis Photos Wound Measurements Length: (cm) 0 Width: (cm) 0 Depth: (cm) 0 Area: (cm) 0 Volume: (cm) 0 % Reduction in Area: 100% % Reduction in Volume: 100% Epithelialization: Large (67-100%) Tunneling: No Undermining: No Wound Description Classification: Unstageable/Unclassified Exudate Amount: None Present Foul Odor After Cleansing: No Slough/Fibrino No Wound Bed Granulation Amount: None Present (0%) Exposed Structure Necrotic Amount: None Present (0%) Fascia Exposed: No Fat Layer (Subcutaneous Tissue) Exposed: No Tendon Exposed: No Muscle Exposed: No Joint Exposed: No Bone Exposed: No Electronic Signature(s) Signed: 03/27/2021 7:55:24 AM By: Carlene Coria RN Entered By: Carlene Coria on 03/24/2021 15:43:52 Rowley, [name] ([id number]) -------------------------------------------------------------------------------- Vitals Details Patient Name: [name] Date of Service: 03/24/2021 3:00 PM Medical Record Number: [id number] Patient Account Number: [id number] Date of Birth/Sex: 08-Apr-1928 (87 y.o. F) Treating RN: Carlene Coria Primary Care Avrey Hyser: Viviana Simpler Other Clinician: Referring Lequisha Cammack: Viviana Simpler Treating Ahna Konkle/Extender: Skipper Cliche in Treatment: 22 Vital Signs Time Taken: 15:41 Temperature (F): 97.8 Height (in): 66 Pulse (bpm): 64 Weight (lbs): 76 Respiratory Rate (breaths/min): 16 Body Mass Index (BMI): 12.3 Blood Pressure (mmHg): 144/74 Reference Range: 80 - 120 mg / dl Electronic Signature(s) Signed: 03/27/2021 7:55:24 AM By: Carlene Coria RN Entered By: Carlene Coria on 03/24/2021 15:41:52

## 2021-03-31 ENCOUNTER — Other Ambulatory Visit: Payer: Self-pay

## 2021-03-31 ENCOUNTER — Encounter: Payer: PPO | Admitting: Physician Assistant

## 2021-03-31 DIAGNOSIS — L89513 Pressure ulcer of right ankle, stage 3: Secondary | ICD-10-CM | POA: Diagnosis not present

## 2021-03-31 DIAGNOSIS — L89623 Pressure ulcer of left heel, stage 3: Secondary | ICD-10-CM | POA: Diagnosis not present

## 2021-03-31 NOTE — Progress Notes (Addendum)
[name], [name] ([id number]) Visit Report for 03/31/2021 Chief Complaint Document Details Patient Name: [name], [name]. Date of Service: 03/31/2021 3:15 PM Medical Record Number: [id number] Patient Account Number: [id number] Date of Birth/Sex: 11-14-1927 (87 y.o. F) Treating RN: Donnamarie Poag Primary Care Provider: Viviana Simpler Other Clinician: Referring Provider: Viviana Simpler Treating Provider/Extender: Skipper Cliche in Treatment: 23 Information Obtained from: Patient Chief Complaint Pressure ulcer right ankle and left heel and right ischial/buttock pressure ulcer Electronic Signature(s) Signed: 03/31/2021 3:45:59 PM By: Worthy Keeler PA-C Entered By: Worthy Keeler on 03/31/2021 15:45:59 [name], [name] ([id number]) -------------------------------------------------------------------------------- HPI Details Patient Name: [name], [name] Date of Service: 03/31/2021 3:15 PM Medical Record Number: [id number] Patient Account Number: [id number] Date of Birth/Sex: 08/23/1927 (87 y.o. F) Treating RN: Donnamarie Poag Primary Care Provider: Viviana Simpler Other Clinician: Referring Provider: Viviana Simpler Treating Provider/Extender: Skipper Cliche in Treatment: 23 History of Present Illness HPI Description: 10/21/2020 upon evaluation today patient presents for initial inspection here in our clinic concerning issues that she has been having quite some time in regard to her ankle and since she has been in the hospital with regard to the heel. The ankle in fact has been 5-6 years at least I am told. With that being said the heel ulcer occurred when she was in the hospital in December for hip surgery when she fractured her hip. She also was in the hospital Tuesday for altered mental status. Really there was nothing that was identified as the cause for this. She did have a. Fortunately there does not appear to be any signs of infection she tells me that she has had she believes arterial  studies At [facility] clinic I could not find Those studies at this point. Nonetheless I will continue to look and see what I can find before Then. The patient also has a skin tear on her arm this occurred [name] recently when she bumped this at home. The patient does have a history of hypertension, coronary artery disease, and peripheral vascular disease stated. 10/28/2020 I was able to find the patient's chart currently which shows that she did have an arterial study performed in May 2019. This showed that she had a abnormal right toe brachial index and a normal left toe brachial index. She was noncompressible as far as ABIs were concerned. She did appear to have triphasic flow at that time. Unfortunately the wound that is commented on in the report that I printed off and read mentions the same wound on the ankle that we are still dealing with at this point. Unfortunately this has not healed. And its been quite sometime about 3 years now. Fortunately there does not appear to be any signs of active infection systemically at this point. I think that the patient has done well with the Santyl over the past week which is good news. Patient's caregiver which is her daughter-in-law is concerned about the fact that she really does not feel qualified to be able to change the dressings and take care of this issue. There does not appear to be any signs of anything untoward going on at this point. I think she is done a great job applying the [employer] and I think that has done a great job for the patient is for soften up some of the necrotic tissue. With that being said I think the Xeroform on the arm also has done excellent. In general I am very pleased with where we stand. And I told the patient's  daughter-in- law as well that I also feel like she has done a great job over the past week taking care of her mother. 11/11/2020 upon evaluation today patient appears to be doing well with regard to her wounds. She has been  tolerating the dressing changes without complication her daughters been applying the Santyl which has done a great job. Ho with that being said I think now that we have good arterial study showing we can go ahead and proceed with sharp debridement at this point. 11/25/2020 upon evaluation today patient appears to be doing well with regard to her wounds. The Santyl has really helped to clean things up and overall she is doing quite excellent at this point. Fortunately there does not appear to be any signs of infection which is great news and in general extremely pleased. I do believe some debridement is in order and hopefully will be able to get her into a collagen dressing after this. 12/23/2020 upon evaluation today patient appears to be doing well with regard to her wound all things considered. Unfortunately it does sound like her daughter decided to let this air out because she felt like it was getting so wet. It sounds like she got border gauze dressings instead of border foam therefore there is really Nothing to catch the excess drainage which I think is the problem they were worried about the smell. Fortunately there does not appear to be any signs of active infection which is great news. Nonetheless I do not think we want to leave this just open to air. 01/13/2021 upon evaluation today patient's wounds actually appear to be about as good as have seen since have been taking care of her. Fortunately there does not appear to be any evidence of infection which is great and overall the biggest issue I see is that of fluid buildup. I think we need to do something to try to help manage this. I think if we can control her edema we get the wounds to heal [name] effectively. 01/27/2021 upon evaluation today patient appears to be doing well in regard to the wounds on her right lateral malleolus as well as the left heel and the right ischial tuberosity is unfortunately a new area that has arisen since we last saw  her. She tells me currently that that is from sitting too much. With that being said this actually appears to be doing the worst of anything so far that I see today. Fortunately there does not appear to be any signs of infection this is at least good news. Compression wrap seem to be doing excellent for her as far as the legs are concerned. 02/03/2021 upon evaluation today patient appears to be doing well with regard to all of her wounds. She has been tolerating the dressing changes without complication. Fortunately there is no signs of active infection at this time. No fevers, chills, nausea, vomiting, or diarrhea. 02/10/2021 upon evaluation today patient appears to be doing well with regard to her wounds. With that being said there is some slight evidence of hypergranulation in regard to the right ankle in particular and a little bit in regard to the left heel. I think Hydrofera Blue might be a better option to go to at this point based on what I am seeing especially since both of these wounds in particular seem to be a little bit stalled. I discussed that with the patient and her daughter today. With regard to the hip this is doing great with the  collagen I think will get very close to complete closure I recommend we continue with the collagen. 02/17/2021 upon evaluation today patient appears to be doing excellent in regard to her heel ulcers. She has been tolerating the dressing changes without complication and overall I am extremely pleased with where things stand today. There does not appear to be any signs of active infection which is great news. Overall I may think that we are headed in the proper direction based on what I am seeing. 02/24/2021 upon evaluation today patient's wounds actually are showing signs of improvement in regard to her heels both are doing awesome. In regard to her hip location this is actually reopened after being closed last week and to be perfectly honest I am [name] concerned  here about the fact that pressures causing this issue I do not think it has anything to do with her not having the collagen in place again it was completely healed last week there was no reason for the collagen to be there. 03/03/2021 upon evaluation today patient appears to be doing well with regard to her wounds. Everything is showing signs of improvement and [name], [name]. ([id number]) doing some much better as far as the overall size of the wound. Fortunately I think we are headed in the right direction. 03/10/2021 upon evaluation today patient appears to be doing well with regard to her wound. She is tolerating the dressing changes without complication. The left heel pretty much appears to be almost completely healed although I think it is probably can be 1 [name] week before I can call this 100% slough. The right ankle is significantly improved with that being said its not completely closed. With regard to the right hip this is completely closed. Overall I am very pleased with where things stand I think were getting very close to complete resolution. I discussed all this with the patient and her daughter-in-law today. 03/17/2021 upon evaluation today patient appears to be doing well with regard to her wounds. Fortunately there does not appear to be any signs of active infection which is great news and overall very pleased with where the patient stands today. I am in general thankful that overall she is making great progress here. There is good to be a little bit of debridement here to clear away some of the necrotic debris today on both wound locations. 03/24/2021 upon evaluation today patient appears to be doing excellent in regard to her wound. She has been tolerating the dressing changes without complication. Fortunately there does not appear to be any evidence of infection the left foot is completely healed this is great news. On the right at the ankle region this is still open though I do not  think the alginate did quite as well as I was hoping as far as getting this dried out. I think that we may just want to switch back to the [facility] which has done well up to this point. I was just hopeful this would wrap things up completely for [place]. 03/31/2021 upon evaluation today patient appears to be doing well with regard to her wound. This is on the right side which appears to be doing better than last week I think is definitely measuring smaller and this is great news. Overall I am very pleased with where we stand today. I do believe the [facility] is doing better for her. Electronic Signature(s) Signed: 03/31/2021 4:46:31 PM By: Worthy Keeler PA-C Entered By: Worthy Keeler on 03/31/2021  [name] ([id number]) -------------------------------------------------------------------------------- Physical Exam Details Patient Name: [name], [name]. Date of Service: 03/31/2021 3:15 PM Medical Record Number: [id number] Patient Account Number: [id number] Date of Birth/Sex: March 07, 1928 (87 y.o. F) Treating RN: Donnamarie Poag Primary Care Provider: Viviana Simpler Other Clinician: Referring Provider: Viviana Simpler Treating Provider/Extender: Skipper Cliche in Treatment: 85 Constitutional Well-nourished and well-hydrated in no acute distress. Respiratory normal breathing without difficulty. Psychiatric this patient is able to make decisions and demonstrates good insight into disease process. Alert and Oriented x 3. pleasant and cooperative. Notes Upon inspection patient's wound bed showed signs of good granulation epithelization at this point. Fortunately there does not appear to be any signs of active infection which is great news and overall I am extremely pleased with where we stand at this point. Electronic Signature(s) Signed: 03/31/2021 4:46:49 PM By: Worthy Keeler PA-C Entered By: Worthy Keeler on 03/31/2021 16:46:49 [name], [name]  ([id number]) -------------------------------------------------------------------------------- Physician Orders Details Patient Name: [name], [name] Date of Service: 03/31/2021 3:15 PM Medical Record Number: [id number] Patient Account Number: [id number] Date of Birth/Sex: 09/26/1927 (87 y.o. F) Treating RN: Donnamarie Poag Primary Care Provider: Viviana Simpler Other Clinician: Referring Provider: Viviana Simpler Treating Provider/Extender: Skipper Cliche in Treatment: 16 Verbal / Phone Orders: No Diagnosis Coding ICD-10 Coding Code Description L89.513 Pressure ulcer of right ankle, stage 3 L89.623 Pressure ulcer of left heel, stage 3 S51.802A Unspecified open wound of left forearm, initial encounter L89.312 Pressure ulcer of right buttock, stage 2 I10 Essential (primary) hypertension I25.10 Atherosclerotic heart disease of native coronary artery without angina pectoris I73.89 Other specified peripheral vascular diseases Follow-up Appointments o Return Appointment in 1 week. o Nurse Visit as needed Bathing/ Shower/ Hygiene o May shower with wound dressing protected with water repellent cover or cast protector. o No tub bath. Edema Control - Lymphedema / Segmental Compressive Device / Other Bilateral Lower Extremities o Optional: One layer of unna paste to top of compression wrap (to act as an anchor). o Tubigrip single layer applied. - left lower leg size C o Elevate legs to the level of the heart and pump ankles as often as possible o Elevate leg(s) parallel to the floor when sitting. o DO YOUR BEST to sleep in the bed at night. DO NOT sleep in your recliner. Long hours of sitting in a recliner leads to swelling of the legs and/or potential wounds on your backside. Off-Loading o Gel wheelchair cushion - prop when sitting or laying to avoid pressure on your hip wound o Turn and reposition every 2 hours o Other: - Border foam dressing for protection to right  hip Wound Treatment Wound #1 - Malleolus Wound Laterality: Right, Lateral Cleanser: Soap and Water 1 x Per Week/30 Days Discharge Instructions: Gently cleanse wound with antibacterial soap, rinse and pat dry prior to dressing wounds Primary Dressing: Hydrofera Blue Ready Transfer Foam, 2.5x2.5 (in/in) 1 x Per Week/30 Days Discharge Instructions: Apply Hydrofera Blue Ready to wound bed as directed Secondary Dressing: Gauze 1 x Per Week/30 Days Discharge Instructions: As directed: dry, moistened with saline or moistened with Dakins Solution Compression Wrap: Profore Lite LF 3 Multilayer Compression Bandaging System 1 x Per Week/30 Days Discharge Instructions: Apply 3 multi-layer wrap as prescribed. Electronic Signature(s) Signed: 03/31/2021 4:06:17 PM By: Donnamarie Poag Signed: 03/31/2021 4:54:51 PM By: Worthy Keeler PA-C Entered By: Donnamarie Poag on 03/31/2021 15:50:17 [name], [name] ([id number]) [name], [name] ([id number]) -------------------------------------------------------------------------------- Problem List Details Patient Name: [name], [name] Date  of Service: 03/31/2021 3:15 PM Medical Record Number: [id number] Patient Account Number: [id number] Date of Birth/Sex: 03-20-1928 (87 y.o. F) Treating RN: Donnamarie Poag Primary Care Provider: Viviana Simpler Other Clinician: Referring Provider: Viviana Simpler Treating Provider/Extender: Skipper Cliche in Treatment: 23 Active Problems ICD-10 Encounter Code Description Active Date MDM Diagnosis L89.513 Pressure ulcer of right ankle, stage 3 10/21/2020 No Yes L89.623 Pressure ulcer of left heel, stage 3 10/21/2020 No Yes S51.802A Unspecified open wound of left forearm, initial encounter 10/21/2020 No Yes L89.312 Pressure ulcer of right buttock, stage 2 01/27/2021 No Yes I10 Essential (primary) hypertension 10/21/2020 No Yes I25.10 Atherosclerotic heart disease of native coronary artery without angina 10/21/2020 No Yes pectoris I73.89  Other specified peripheral vascular diseases 10/21/2020 No Yes Inactive Problems Resolved Problems Electronic Signature(s) Signed: 03/31/2021 3:45:43 PM By: Worthy Keeler PA-C Entered By: Worthy Keeler on 03/31/2021 15:45:43 [name], [name] ([id number]) -------------------------------------------------------------------------------- Progress Note Details Patient Name: [name] Date of Service: 03/31/2021 3:15 PM Medical Record Number: [id number] Patient Account Number: [id number] Date of Birth/Sex: 1928/05/01 (87 y.o. F) Treating RN: Donnamarie Poag Primary Care Provider: Viviana Simpler Other Clinician: Referring Provider: Viviana Simpler Treating Provider/Extender: Skipper Cliche in Treatment: 23 Subjective Chief Complaint Information obtained from Patient Pressure ulcer right ankle and left heel and right ischial/buttock pressure ulcer History of Present Illness (HPI) 10/21/2020 upon evaluation today patient presents for initial inspection here in our clinic concerning issues that she has been having quite some time in regard to her ankle and since she has been in the hospital with regard to the heel. The ankle in fact has been 5-6 years at least I am told. With that being said the heel ulcer occurred when she was in the hospital in December for hip surgery when she fractured her hip. She also was in the hospital Tuesday for altered mental status. Really there was nothing that was identified as the cause for this. She did have a. Fortunately there does not appear to be any signs of infection she tells me that she has had she believes arterial studies At [facility] clinic I could not find Those studies at this point. Nonetheless I will continue to look and see what I can find before Then. The patient also has a skin tear on her arm this occurred [name] recently when she bumped this at home. The patient does have a history of hypertension, coronary artery disease, and peripheral  vascular disease stated. 10/28/2020 I was able to find the patient's chart currently which shows that she did have an arterial study performed in May 2019. This showed that she had a abnormal right toe brachial index and a normal left toe brachial index. She was noncompressible as far as ABIs were concerned. She did appear to have triphasic flow at that time. Unfortunately the wound that is commented on in the report that I printed off and read mentions the same wound on the ankle that we are still dealing with at this point. Unfortunately this has not healed. And its been quite sometime about 3 years now. Fortunately there does not appear to be any signs of active infection systemically at this point. I think that the patient has done well with the Santyl over the past week which is good news. Patient's caregiver which is her daughter-in-law is concerned about the fact that she really does not feel qualified to be able to change the dressings and take care of this issue. There does not appear to  be any signs of anything untoward going on at this point. I think she is done a great job applying the [employer] and I think that has done a great job for the patient is for soften up some of the necrotic tissue. With that being said I think the Xeroform on the arm also has done excellent. In general I am very pleased with where we stand. And I told the patient's daughter-in- law as well that I also feel like she has done a great job over the past week taking care of her mother. 11/11/2020 upon evaluation today patient appears to be doing well with regard to her wounds. She has been tolerating the dressing changes without complication her daughters been applying the Santyl which has done a great job. Ho with that being said I think now that we have good arterial study showing we can go ahead and proceed with sharp debridement at this point. 11/25/2020 upon evaluation today patient appears to be doing well with  regard to her wounds. The Santyl has really helped to clean things up and overall she is doing quite excellent at this point. Fortunately there does not appear to be any signs of infection which is great news and in general extremely pleased. I do believe some debridement is in order and hopefully will be able to get her into a collagen dressing after this. 12/23/2020 upon evaluation today patient appears to be doing well with regard to her wound all things considered. Unfortunately it does sound like her daughter decided to let this air out because she felt like it was getting so wet. It sounds like she got border gauze dressings instead of border foam therefore there is really Nothing to catch the excess drainage which I think is the problem they were worried about the smell. Fortunately there does not appear to be any signs of active infection which is great news. Nonetheless I do not think we want to leave this just open to air. 01/13/2021 upon evaluation today patient's wounds actually appear to be about as good as have seen since have been taking care of her. Fortunately there does not appear to be any evidence of infection which is great and overall the biggest issue I see is that of fluid buildup. I think we need to do something to try to help manage this. I think if we can control her edema we get the wounds to heal [name] effectively. 01/27/2021 upon evaluation today patient appears to be doing well in regard to the wounds on her right lateral malleolus as well as the left heel and the right ischial tuberosity is unfortunately a new area that has arisen since we last saw her. She tells me currently that that is from sitting too much. With that being said this actually appears to be doing the worst of anything so far that I see today. Fortunately there does not appear to be any signs of infection this is at least good news. Compression wrap seem to be doing excellent for her as far as the legs are  concerned. 02/03/2021 upon evaluation today patient appears to be doing well with regard to all of her wounds. She has been tolerating the dressing changes without complication. Fortunately there is no signs of active infection at this time. No fevers, chills, nausea, vomiting, or diarrhea. 02/10/2021 upon evaluation today patient appears to be doing well with regard to her wounds. With that being said there is some slight evidence of hypergranulation in regard  to the right ankle in particular and a little bit in regard to the left heel. I think Hydrofera Blue might be a better option to go to at this point based on what I am seeing especially since both of these wounds in particular seem to be a little bit stalled. I discussed that with the patient and her daughter today. With regard to the hip this is doing great with the collagen I think will get very close to complete closure I recommend we continue with the collagen. 02/17/2021 upon evaluation today patient appears to be doing excellent in regard to her heel ulcers. She has been tolerating the dressing changes without complication and overall I am extremely pleased with where things stand today. There does not appear to be any signs of active infection which is great news. Overall I may think that we are headed in the proper direction based on what I am seeing. 02/24/2021 upon evaluation today patient's wounds actually are showing signs of improvement in regard to her heels both are doing awesome. In [place], [place] ([id number]) regard to her hip location this is actually reopened after being closed last week and to be perfectly honest I am [name] concerned here about the fact that pressures causing this issue I do not think it has anything to do with her not having the collagen in place again it was completely healed last week there was no reason for the collagen to be there. 03/03/2021 upon evaluation today patient appears to be doing well with regard  to her wounds. Everything is showing signs of improvement and doing some much better as far as the overall size of the wound. Fortunately I think we are headed in the right direction. 03/10/2021 upon evaluation today patient appears to be doing well with regard to her wound. She is tolerating the dressing changes without complication. The left heel pretty much appears to be almost completely healed although I think it is probably can be 1 [name] week before I can call this 100% slough. The right ankle is significantly improved with that being said its not completely closed. With regard to the right hip this is completely closed. Overall I am very pleased with where things stand I think were getting very close to complete resolution. I discussed all this with the patient and her daughter-in-law today. 03/17/2021 upon evaluation today patient appears to be doing well with regard to her wounds. Fortunately there does not appear to be any signs of active infection which is great news and overall very pleased with where the patient stands today. I am in general thankful that overall she is making great progress here. There is good to be a little bit of debridement here to clear away some of the necrotic debris today on both wound locations. 03/24/2021 upon evaluation today patient appears to be doing excellent in regard to her wound. She has been tolerating the dressing changes without complication. Fortunately there does not appear to be any evidence of infection the left foot is completely healed this is great news. On the right at the ankle region this is still open though I do not think the alginate did quite as well as I was hoping as far as getting this dried out. I think that we may just want to switch back to the [facility] which has done well up to this point. I was just hopeful this would wrap things up completely for [place]. 03/31/2021 upon evaluation today patient appears to be  doing well with regard  to her wound. This is on the right side which appears to be doing better than last week I think is definitely measuring smaller and this is great news. Overall I am very pleased with where we stand today. I do believe the [facility] is doing better for her. Objective Constitutional Well-nourished and well-hydrated in no acute distress. Vitals Time Taken: 3:29 PM, Height: 66 in, Weight: 76 lbs, BMI: 12.3, Temperature: 97.9 F, Pulse: 69 bpm, Respiratory Rate: 16 breaths/min, Blood Pressure: 174/83 mmHg. Respiratory normal breathing without difficulty. Psychiatric this patient is able to make decisions and demonstrates good insight into disease process. Alert and Oriented x 3. pleasant and cooperative. General Notes: Upon inspection patient's wound bed showed signs of good granulation epithelization at this point. Fortunately there does not appear to be any signs of active infection which is great news and overall I am extremely pleased with where we stand at this point. Integumentary (Hair, Skin) Wound #1 status is Open. Original cause of wound was Gradually Appeared. The date acquired was: 10/22/2014. The wound has been in treatment 23 weeks. The wound is located on the Right,Lateral Malleolus. The wound measures 0.2cm length x 0.2cm width x 0.1cm depth; 0.031cm^2 area and 0.003cm^3 volume. There is Fat Layer (Subcutaneous Tissue) exposed. There is a medium amount of serous drainage noted. The wound margin is flat and intact. There is large (67-100%) red, pink granulation within the wound bed. There is a small (1-33%) amount of necrotic tissue within the wound bed including Adherent Slough. Assessment Active Problems ICD-10 Pressure ulcer of right ankle, stage 3 Pressure ulcer of left heel, stage 3 Unspecified open wound of left forearm, initial encounter Pressure ulcer of right buttock, stage 2 Essential (primary) hypertension Atherosclerotic heart disease of native coronary artery  without angina pectoris Other specified peripheral vascular diseases TUJUANA, KILMARTIN ([id number]) Procedures Wound #1 Pre-procedure diagnosis of Wound #1 is a Pressure Ulcer located on the Right,Lateral Malleolus . There was a Three Layer Compression Therapy Procedure by Donnamarie Poag, RN. Post procedure Diagnosis Wound #1: Same as Pre-Procedure Plan Follow-up Appointments: Return Appointment in 1 week. Nurse Visit as needed Bathing/ Shower/ Hygiene: May shower with wound dressing protected with water repellent cover or cast protector. No tub bath. Edema Control - Lymphedema / Segmental Compressive Device / Other: Optional: One layer of unna paste to top of compression wrap (to act as an anchor). Tubigrip single layer applied. - left lower leg size C Elevate legs to the level of the heart and pump ankles as often as possible Elevate leg(s) parallel to the floor when sitting. DO YOUR BEST to sleep in the bed at night. DO NOT sleep in your recliner. Long hours of sitting in a recliner leads to swelling of the legs and/or potential wounds on your backside. Off-Loading: Gel wheelchair cushion - prop when sitting or laying to avoid pressure on your hip wound Turn and reposition every 2 hours Other: - Border foam dressing for protection to right hip WOUND #1: - Malleolus Wound Laterality: Right, Lateral Cleanser: Soap and Water 1 x Per Week/30 Days Discharge Instructions: Gently cleanse wound with antibacterial soap, rinse and pat dry prior to dressing wounds Primary Dressing: Hydrofera Blue Ready Transfer Foam, 2.5x2.5 (in/in) 1 x Per Week/30 Days Discharge Instructions: Apply Hydrofera Blue Ready to wound bed as directed Secondary Dressing: Gauze 1 x Per Week/30 Days Discharge Instructions: As directed: dry, moistened with saline or moistened with Dakins Solution Compression Wrap: Profore  Lite LF 3 Multilayer Compression Bandaging System 1 x Per Week/30 Days Discharge Instructions: Apply  3 multi-layer wrap as prescribed. 1. Would recommend that we continue with the [facility] as that seems to be doing well and the patient is in agreement with that plan. 2. I would also recommend that we continue with the compression wrap on the right leg. We are utilizing a 3 layer compression wrap which is doing a great job. 3. I am also going to suggest she continue to use Tubigrip for the left leg and right now honestly I think that this is probably can to be her only option for compression ongoing as apparently even in elastic therapy they do not make compression socks small enough to fit her. We discussed that in great detail today. We will see patient back for reevaluation in 1 week here in the clinic. If anything worsens or changes patient will contact our office for additional recommendations. Electronic Signature(s) Signed: 03/31/2021 4:48:52 PM By: Worthy Keeler PA-C Entered By: Worthy Keeler on 03/31/2021 16:48:52 Curvin, [name] ([id number]) -------------------------------------------------------------------------------- SuperBill Details Patient Name: VICTORIYA, POL Date of Service: 03/31/2021 Medical Record Number: [id number] Patient Account Number: [id number] Date of Birth/Sex: 01-16-1928 (87 y.o. F) Treating RN: Donnamarie Poag Primary Care Provider: Viviana Simpler Other Clinician: Referring Provider: Viviana Simpler Treating Provider/Extender: Skipper Cliche in Treatment: 23 Diagnosis Coding ICD-10 Codes Code Description L89.513 Pressure ulcer of right ankle, stage 3 L89.623 Pressure ulcer of left heel, stage 3 S51.802A Unspecified open wound of left forearm, initial encounter L89.312 Pressure ulcer of right buttock, stage 2 I10 Essential (primary) hypertension I25.10 Atherosclerotic heart disease of native coronary artery without angina pectoris I73.89 Other specified peripheral vascular diseases Facility Procedures CPT4 Code: 54008676 Description:  (Facility Use Only) [phone] - [place] RT LEG Modifier: Quantity: 1 Physician Procedures CPT4 Code: 6712458 Description: 09983 - WC PHYS LEVEL 4 - EST PT Modifier: Quantity: 1 CPT4 Code: Description: ICD-10 Diagnosis Description L89.513 Pressure ulcer of right ankle, stage 3 L89.623 Pressure ulcer of left heel, stage 3 S51.802A Unspecified open wound of left forearm, initial encounter L89.312 Pressure ulcer of right buttock, stage 2 Modifier: Quantity: Electronic Signature(s) Signed: 03/31/2021 4:49:10 PM By: Worthy Keeler PA-C Previous Signature: 03/31/2021 4:06:17 PM Version By: Donnamarie Poag Entered By: Worthy Keeler on 03/31/2021 16:49:10

## 2021-03-31 NOTE — Progress Notes (Addendum)
SAYRE, MAZOR ([id number]) Visit Report for 03/31/2021 Arrival Information Details Patient Name: SHAYNAH, HUND Date of Service: 03/31/2021 3:15 PM Medical Record Number: [id number] Patient Account Number: [id number] Date of Birth/Sex: October 31, 1927 (87 y.o. F) Treating RN: Donnamarie Poag Primary Care Shernell Saldierna: Viviana Simpler Other Clinician: Referring Ziair Penson: Viviana Simpler Treating Nygel Prokop/Extender: Skipper Cliche in Treatment: 23 Visit Information History Since Last Visit Added or deleted any medications: No Patient Arrived: [name] Had a fall or experienced change in No Arrival Time: 15:25 activities of daily living that may affect Accompanied By: daughter risk of falls: Transfer Assistance: None Hospitalized since last visit: No Patient Requires Transmission-Based Precautions: No Has Dressing in Place as Prescribed: Yes Patient Has Alerts: Yes Has Compression in Place as Prescribed: Yes Patient Alerts: NOT DIABETIC Pain Present Now: No TBI left .77 TBI right .76 Electronic Signature(s) Signed: 03/31/2021 3:42:23 PM By: Donnamarie Poag Entered By: Donnamarie Poag on 03/31/2021 15:30:17 [name] ([id number]) -------------------------------------------------------------------------------- Clinic Level of Care Assessment Details Patient Name: CAISLEY, BAXENDALE Date of Service: 03/31/2021 3:15 PM Medical Record Number: [id number] Patient Account Number: [id number] Date of Birth/Sex: Dec 07, 1927 (87 y.o. F) Treating RN: Donnamarie Poag Primary Care Myson Levi: Viviana Simpler Other Clinician: Referring Kymir Coles: Viviana Simpler Treating Rachell Druckenmiller/Extender: Skipper Cliche in Treatment: 23 Clinic Level of Care Assessment Items TOOL 1 Quantity Score []  - Use when EandM and Procedure is performed on INITIAL visit 0 ASSESSMENTS - Nursing Assessment / Reassessment []  - General Physical Exam (combine w/ comprehensive assessment (listed just below) when performed on new 0 pt.  evals) []  - 0 Comprehensive Assessment (HX, ROS, Risk Assessments, Wounds Hx, etc.) ASSESSMENTS - Wound and Skin Assessment / Reassessment []  - Dermatologic / Skin Assessment (not related to wound area) 0 ASSESSMENTS - Ostomy and/or Continence Assessment and Care []  - Incontinence Assessment and Management 0 []  - 0 Ostomy Care Assessment and Management (repouching, etc.) PROCESS - Coordination of Care []  - Simple Patient / Family Education for ongoing care 0 []  - 0 Complex (extensive) Patient / Family Education for ongoing care []  - 0 Staff obtains Programmer, systems, Records, Test Results / Process Orders []  - 0 Staff telephones HHA, Nursing Homes / Clarify orders / etc []  - 0 Routine Transfer to another Facility (non-emergent condition) []  - 0 Routine Hospital Admission (non-emergent condition) []  - 0 New Admissions / Biomedical engineer / Ordering NPWT, Apligraf, etc. []  - 0 Emergency Hospital Admission (emergent condition) PROCESS - Special Needs []  - Pediatric / Minor Patient Management 0 []  - 0 Isolation Patient Management []  - 0 Hearing / Language / Visual special needs []  - 0 Assessment of Community assistance (transportation, D/C planning, etc.) []  - 0 Additional assistance / Altered mentation []  - 0 Support Surface(s) Assessment (bed, cushion, seat, etc.) INTERVENTIONS - Miscellaneous []  - External ear exam 0 []  - 0 Patient Transfer (multiple staff / Civil Service fast streamer / Similar devices) []  - 0 Simple Staple / Suture removal (25 or less) []  - 0 Complex Staple / Suture removal (26 or more) []  - 0 Hypo/Hyperglycemic Management (do not check if billed separately) []  - 0 Ankle / Brachial Index (ABI) - do not check if billed separately Has the patient been seen at the hospital within the last three years: Yes Total Score: 0 Level Of Care: ____ [name] ([id number]) Electronic Signature(s) Signed: 03/31/2021 4:06:17 PM By: Donnamarie Poag Entered By: Donnamarie Poag on  03/31/2021 15:50:25 Kassner, [name] ([id number]) -------------------------------------------------------------------------------- Compression Therapy Details Patient Name: [name]  Date of Service: 03/31/2021 3:15 PM Medical Record Number: [id number] Patient Account Number: [id number] Date of Birth/Sex: May 05, 1928 (87 y.o. F) Treating RN: Donnamarie Poag Primary Care Rance Smithson: Viviana Simpler Other Clinician: Referring Allayah Raineri: Viviana Simpler Treating Codie Krogh/Extender: Skipper Cliche in Treatment: 23 Compression Therapy Performed for Wound Assessment: Wound #1 Right,Lateral Malleolus Performed By: Junius Argyle, RN Compression Type: Three Layer Post Procedure Diagnosis Same as Pre-procedure Electronic Signature(s) Signed: 03/31/2021 4:06:17 PM By: Donnamarie Poag Entered By: Donnamarie Poag on 03/31/2021 15:48:26 Tartaglia, [name] ([id number]) -------------------------------------------------------------------------------- Encounter Discharge Information Details Patient Name: PAULLETTE, MCKAIN Date of Service: 03/31/2021 3:15 PM Medical Record Number: [id number] Patient Account Number: [id number] Date of Birth/Sex: 1928-05-23 (87 y.o. F) Treating RN: Donnamarie Poag Primary Care Orlean Holtrop: Viviana Simpler Other Clinician: Referring Stuart Mirabile: Viviana Simpler Treating Lezly Rumpf/Extender: Skipper Cliche in Treatment: 23 Encounter Discharge Information Items Discharge Condition: Stable Ambulatory Status: Walker Discharge Destination: Home Transportation: Private Auto Accompanied By: daughter Schedule Follow-up Appointment: Yes Clinical Summary of Care: Electronic Signature(s) Signed: 03/31/2021 4:06:17 PM By: Donnamarie Poag Entered By: Donnamarie Poag on 03/31/2021 16:05:58 Beckmann, [name] ([id number]) -------------------------------------------------------------------------------- Lower Extremity Assessment Details Patient Name: KELLIN, FIFER Date of Service: 03/31/2021 3:15  PM Medical Record Number: [id number] Patient Account Number: [id number] Date of Birth/Sex: 03-Mar-1928 (87 y.o. F) Treating RN: Donnamarie Poag Primary Care Parrish Bonn: Viviana Simpler Other Clinician: Referring Alyson Ki: Viviana Simpler Treating Franklyn Cafaro/Extender: Skipper Cliche in Treatment: 23 Edema Assessment Assessed: [Left: Yes] Patrice Paradise: Yes] Edema: [Left: No] [Right: No] Calf Left: Right: Point of Measurement: 33 cm From Medial Instep 22 cm 22 cm Ankle Left: Right: Point of Measurement: 9 cm From Medial Instep 15 cm 15.5 cm Vascular Assessment Pulses: Dorsalis Pedis Palpable: [Left:Yes] [Right:Yes] Electronic Signature(s) Signed: 03/31/2021 3:42:23 PM By: Donnamarie Poag Entered By: Donnamarie Poag on 03/31/2021 15:37:36 Skorupski, [name] ([id number]) -------------------------------------------------------------------------------- Multi Wound Chart Details Patient Name: [name] Date of Service: 03/31/2021 3:15 PM Medical Record Number: [id number] Patient Account Number: [id number] Date of Birth/Sex: Jun 08, 1928 (87 y.o. F) Treating RN: Donnamarie Poag Primary Care Jerol Rufener: Viviana Simpler Other Clinician: Referring Jaylise Peek: Viviana Simpler Treating Coleen Cardiff/Extender: Skipper Cliche in Treatment: 23 Vital Signs Height(in): 66 Pulse(bpm): 69 Weight(lbs): 76 Blood Pressure(mmHg): 174/83 Body Mass Index(BMI): 12 Temperature(F): 97.9 Respiratory Rate(breaths/min): 16 Photos: [N/A:N/A] Wound Location: Right, Lateral Malleolus N/A N/A Wounding Event: Gradually Appeared N/A N/A Primary Etiology: Pressure Ulcer N/A N/A Comorbid History: Arrhythmia, Coronary Artery N/A N/A Disease, Hypertension, Peripheral Arterial Disease, History of pressure wounds, Osteoarthritis Date Acquired: 10/22/2014 N/A N/A Weeks of Treatment: 23 N/A N/A Wound Status: Open N/A N/A Measurements L x W x D (cm) 0.2x0.2x0.1 N/A N/A Area (cm) : 0.031 N/A N/A Volume (cm) : 0.003 N/A N/A % Reduction in  Area: 96.10% N/A N/A % Reduction in Volume: 96.20% N/A N/A Classification: Category/Stage III N/A N/A Exudate Amount: Medium N/A N/A Exudate Type: Serous N/A N/A Exudate Color: amber N/A N/A Wound Margin: Flat and Intact N/A N/A Granulation Amount: Large (67-100%) N/A N/A Granulation Quality: Red, Pink N/A N/A Necrotic Amount: Small (1-33%) N/A N/A Exposed Structures: Fat Layer (Subcutaneous Tissue): N/A N/A Yes Fascia: No Tendon: No Muscle: No Joint: No Bone: No Epithelialization: Small (1-33%) N/A N/A Treatment Notes Electronic Signature(s) Signed: 03/31/2021 4:06:17 PM By: Donnamarie Poag Entered By: Donnamarie Poag on 03/31/2021 15:47:19 Murillo, [name] ([id number]) -------------------------------------------------------------------------------- [place] Details Patient Name: STEPHANNIE, BRONER Date of Service: 03/31/2021 3:15 PM Medical Record Number: [id number] Patient Account Number: [id number] Date of Birth/Sex: Feb 27, 1928 (  87 y.o. F) Treating RN: Donnamarie Poag Primary Care Isabella Roemmich: Viviana Simpler Other Clinician: Referring Ivon Roedel: Viviana Simpler Treating Rosalyn Archambault/Extender: Skipper Cliche in Treatment: 23 Active Inactive Electronic Signature(s) Signed: 03/31/2021 3:42:23 PM By: Donnamarie Poag Entered By: Donnamarie Poag on 03/31/2021 15:37:47 Jenifer, [name] ([id number]) -------------------------------------------------------------------------------- Pain Assessment Details Patient Name: AUDREA, BOLTE Date of Service: 03/31/2021 3:15 PM Medical Record Number: [id number] Patient Account Number: [id number] Date of Birth/Sex: [birth year] (87 y.o. F) Treating RN: Donnamarie Poag Primary Care Ioane Bhola: Viviana Simpler Other Clinician: Referring Jimmye Wisnieski: Viviana Simpler Treating Nichoals Heyde/Extender: Skipper Cliche in Treatment: 23 Active Problems Location of Pain Severity and Description of Pain Patient Has Paino No Site Locations Rate the pain. Current Pain  Level: 0 Pain Management and Medication Current Pain Management: Electronic Signature(s) Signed: 03/31/2021 3:42:23 PM By: Donnamarie Poag Entered By: Donnamarie Poag on 03/31/2021 15:30:40 Bushart, [name] ([id number]) -------------------------------------------------------------------------------- Patient/Caregiver Education Details Patient Name: [name] Date of Service: 03/31/2021 3:15 PM Medical Record Number: [id number] Patient Account Number: [id number] Date of Birth/Gender: 06-02-1928 (87 y.o. F) Treating RN: Donnamarie Poag Primary Care Physician: Viviana Simpler Other Clinician: Referring Physician: Viviana Simpler Treating Physician/Extender: Skipper Cliche in Treatment: 75 Education Assessment Education Provided To: Patient Education Topics Provided Basic Hygiene: Wound/Skin Impairment: Engineer, maintenance) Signed: 03/31/2021 4:06:17 PM By: Donnamarie Poag Entered By: Donnamarie Poag on 03/31/2021 15:48:01 Khiev, [name] ([id number]) -------------------------------------------------------------------------------- Wound Assessment Details Patient Name: BURDETTE, GERGELY Date of Service: 03/31/2021 3:15 PM Medical Record Number: [id number] Patient Account Number: [id number] Date of Birth/Sex: 06-04-1928 (87 y.o. F) Treating RN: Donnamarie Poag Primary Care Florance Paolillo: Viviana Simpler Other Clinician: Referring Zamarion Longest: Viviana Simpler Treating Cully Luckow/Extender: Skipper Cliche in Treatment: 23 Wound Status Wound Number: 1 Primary Pressure Ulcer Etiology: Wound Location: Right, Lateral Malleolus Wound Open Wounding Event: Gradually Appeared Status: Date Acquired: 10/22/2014 Comorbid Arrhythmia, Coronary Artery Disease, Hypertension, Weeks Of Treatment: 23 History: Peripheral Arterial Disease, History of pressure wounds, Clustered Wound: No Osteoarthritis Photos Wound Measurements Length: (cm) 0.2 Width: (cm) 0.2 Depth: (cm) 0.1 Area: (cm) 0.031 Volume: (cm) 0.003 %  Reduction in Area: 96.1% % Reduction in Volume: 96.2% Epithelialization: Small (1-33%) Wound Description Classification: Category/Stage III Wound Margin: Flat and Intact Exudate Amount: Medium Exudate Type: Serous Exudate Color: amber Foul Odor After Cleansing: No Slough/Fibrino Yes Wound Bed Granulation Amount: Large (67-100%) Exposed Structure Granulation Quality: Red, Pink Fascia Exposed: No Necrotic Amount: Small (1-33%) Fat Layer (Subcutaneous Tissue) Exposed: Yes Necrotic Quality: Adherent Slough Tendon Exposed: No Muscle Exposed: No Joint Exposed: No Bone Exposed: No Treatment Notes Wound #1 (Malleolus) Wound Laterality: Right, Lateral Cleanser Soap and Water Discharge Instruction: Gently cleanse wound with antibacterial soap, rinse and pat dry prior to dressing wounds LOURINE, ALBERICO ([id number]) Peri-Wound Care Topical Primary Dressing Hydrofera Blue Ready Transfer Foam, 2.5x2.5 (in/in) Discharge Instruction: Apply Hydrofera Blue Ready to wound bed as directed Secondary Dressing Gauze Discharge Instruction: As directed: dry, moistened with saline or moistened with Dakins Solution Secured With Compression Wrap Profore Lite LF 3 Multilayer Compression Bandaging System Discharge Instruction: Apply 3 multi-layer wrap as prescribed. Compression Stockings Add-Ons Electronic Signature(s) Signed: 03/31/2021 3:42:23 PM By: Donnamarie Poag Entered By: Donnamarie Poag on 03/31/2021 15:37:09 Pat, [name] ([id number]) -------------------------------------------------------------------------------- [place] Details Patient Name: [name] Date of Service: 03/31/2021 3:15 PM Medical Record Number: [id number] Patient Account Number: [id number] Date of Birth/Sex: 08-31-1927 (87 y.o. F) Treating RN: Donnamarie Poag Primary Care Roena Sassaman: Viviana Simpler Other Clinician: Referring [place] Welcome: Viviana Simpler Treating Michelene Keniston/Extender: Skipper Cliche  in Treatment: 23 Vital  Signs Time Taken: 15:29 Temperature (F): 97.9 Height (in): 66 Pulse (bpm): 69 Weight (lbs): 76 Respiratory Rate (breaths/min): 16 Body Mass Index (BMI): 12.3 Blood Pressure (mmHg): 174/83 Reference Range: 80 - 120 mg / dl Electronic Signature(s) Signed: 03/31/2021 3:42:23 PM By: Donnamarie Poag Entered ByDonnamarie Poag on 03/31/2021 15:30:33

## 2021-04-03 ENCOUNTER — Other Ambulatory Visit: Payer: Self-pay | Admitting: Internal Medicine

## 2021-04-03 NOTE — Telephone Encounter (Signed)
Last filled 02-06-21 #60 Last OV 01-16-21 Next OV 06-05-21 [place]

## 2021-04-07 ENCOUNTER — Encounter: Payer: PPO | Admitting: Physician Assistant

## 2021-04-10 ENCOUNTER — Other Ambulatory Visit: Payer: Self-pay | Admitting: Internal Medicine

## 2021-04-10 NOTE — Telephone Encounter (Signed)
Name of Medication: Morphine Sulfate Name of Pharmacy: [facility] or Written Date and Quantity: 829-22 #90 Last Office Visit and Type: 01-16-21-22 Next Office Visit and Type: 06-05-21 Last Controlled Substance Agreement Date: 09-29-20 Last UDS: 04-27-19

## 2021-04-14 ENCOUNTER — Other Ambulatory Visit: Payer: Self-pay

## 2021-04-14 ENCOUNTER — Encounter: Payer: PPO | Attending: Physician Assistant | Admitting: Physician Assistant

## 2021-04-14 DIAGNOSIS — I739 Peripheral vascular disease, unspecified: Secondary | ICD-10-CM | POA: Diagnosis not present

## 2021-04-14 DIAGNOSIS — L89312 Pressure ulcer of right buttock, stage 2: Secondary | ICD-10-CM | POA: Diagnosis not present

## 2021-04-14 DIAGNOSIS — L89623 Pressure ulcer of left heel, stage 3: Secondary | ICD-10-CM | POA: Diagnosis not present

## 2021-04-14 DIAGNOSIS — L89513 Pressure ulcer of right ankle, stage 3: Secondary | ICD-10-CM | POA: Insufficient documentation

## 2021-04-14 DIAGNOSIS — I2510 Atherosclerotic heart disease of native coronary artery without angina pectoris: Secondary | ICD-10-CM | POA: Insufficient documentation

## 2021-04-14 DIAGNOSIS — L89519 Pressure ulcer of right ankle, unspecified stage: Secondary | ICD-10-CM | POA: Diagnosis present

## 2021-04-14 NOTE — Progress Notes (Signed)
EIRENE, RATHER ([id number]) Visit Report for 04/14/2021 Arrival Information Details Patient Name: [name], [name] Date of Service: 04/14/2021 3:15 PM Medical Record Number: [id number] Patient Account Number: [id number] Date of Birth/Sex: April 20, 1928 (87 y.o. F) Treating RN: Donnamarie Poag Primary Care Ravis Herne: Viviana Simpler Other Clinician: Referring Brendan Gruwell: Viviana Simpler Treating Nasiah Polinsky/Extender: Skipper Cliche in Treatment: 25 Visit Information History Since Last Visit Added or deleted any medications: No Patient Arrived: [name] Had a fall or experienced change in No Arrival Time: 15:01 activities of daily living that may affect Accompanied By: daughter risk of falls: Transfer Assistance: None Hospitalized since last visit: No Patient Identification Verified: Yes Has Dressing in Place as Prescribed: Yes Secondary Verification Process Completed: Yes Has Compression in Place as Prescribed: Yes Patient Requires Transmission-Based Precautions: No Pain Present Now: No Patient Has Alerts: Yes Patient Alerts: NOT DIABETIC TBI left .77 TBI right .76 Electronic Signature(s) Signed: 04/14/2021 4:12:32 PM By: Donnamarie Poag Entered By: Donnamarie Poag on 04/14/2021 15:01:35 [name] ([id number]) -------------------------------------------------------------------------------- Clinic Level of Care Assessment Details Patient Name: [name], [name] Date of Service: 04/14/2021 3:15 PM Medical Record Number: [id number] Patient Account Number: [id number] Date of Birth/Sex: [birth year] (87 y.o. F) Treating RN: Donnamarie Poag Primary Care Henryetta Corriveau: Viviana Simpler Other Clinician: Referring Zairah Arista: Viviana Simpler Treating Davianna Deutschman/Extender: Skipper Cliche in Treatment: 25 Clinic Level of Care Assessment Items TOOL 1 Quantity Score []  - Use when EandM and Procedure is performed on INITIAL visit 0 ASSESSMENTS - Nursing Assessment / Reassessment []  - General Physical Exam (combine  w/ comprehensive assessment (listed just below) when performed on new 0 pt. evals) []  - 0 Comprehensive Assessment (HX, ROS, Risk Assessments, Wounds Hx, etc.) ASSESSMENTS - Wound and Skin Assessment / Reassessment []  - Dermatologic / Skin Assessment (not related to wound area) 0 ASSESSMENTS - Ostomy and/or Continence Assessment and Care []  - Incontinence Assessment and Management 0 []  - 0 Ostomy Care Assessment and Management (repouching, etc.) PROCESS - Coordination of Care []  - Simple Patient / Family Education for ongoing care 0 []  - 0 Complex (extensive) Patient / Family Education for ongoing care []  - 0 Staff obtains Programmer, systems, Records, Test Results / Process Orders []  - 0 Staff telephones HHA, Nursing Homes / Clarify orders / etc []  - 0 Routine Transfer to another Facility (non-emergent condition) []  - 0 Routine Hospital Admission (non-emergent condition) []  - 0 New Admissions / Biomedical engineer / Ordering NPWT, Apligraf, etc. []  - 0 Emergency Hospital Admission (emergent condition) PROCESS - Special Needs []  - Pediatric / Minor Patient Management 0 []  - 0 Isolation Patient Management []  - 0 Hearing / Language / Visual special needs []  - 0 Assessment of Community assistance (transportation, D/C planning, etc.) []  - 0 Additional assistance / Altered mentation []  - 0 Support Surface(s) Assessment (bed, cushion, seat, etc.) INTERVENTIONS - Miscellaneous []  - External ear exam 0 []  - 0 Patient Transfer (multiple staff / Civil Service fast streamer / Similar devices) []  - 0 Simple Staple / Suture removal (25 or less) []  - 0 Complex Staple / Suture removal (26 or more) []  - 0 Hypo/Hyperglycemic Management (do not check if billed separately) []  - 0 Ankle / Brachial Index (ABI) - do not check if billed separately Has the patient been seen at the hospital within the last three years: Yes Total Score: 0 Level Of Care: ____ [name] ([id number]) Electronic  Signature(s) Signed: 04/14/2021 4:12:32 PM By: Donnamarie Poag Entered By: Donnamarie Poag on 04/14/2021 16:10:48 Drinkard, [name] ([id number]) --------------------------------------------------------------------------------  Compression Therapy Details Patient Name: [name], DOHN. Date of Service: 04/14/2021 3:15 PM Medical Record Number: [id number] Patient Account Number: [id number] Date of Birth/Sex: 26-Apr-1928 (87 y.o. F) Treating RN: Donnamarie Poag Primary Care Franchon Ketterman: Viviana Simpler Other Clinician: Referring Desten Manor: Viviana Simpler Treating Priscila Bean/Extender: Skipper Cliche in Treatment: 25 Compression Therapy Performed for Wound Assessment: Wound #1 Right,Lateral Malleolus Performed By: Junius Argyle, RN Compression Type: Three Layer Post Procedure Diagnosis Same as Pre-procedure Electronic Signature(s) Signed: 04/14/2021 4:12:32 PM By: Donnamarie Poag Entered By: Donnamarie Poag on 04/14/2021 15:54:07 Blasdel, [name] ([id number]) -------------------------------------------------------------------------------- Encounter Discharge Information Details Patient Name: [name], [name] Date of Service: 04/14/2021 3:15 PM Medical Record Number: [id number] Patient Account Number: [id number] Date of Birth/Sex: [birth year] (87 y.o. F) Treating RN: Donnamarie Poag Primary Care Travion Ke: Viviana Simpler Other Clinician: Referring Parthenia Tellefsen: Viviana Simpler Treating Briele Lagasse/Extender: Skipper Cliche in Treatment: 25 Encounter Discharge Information Items Discharge Condition: Stable Ambulatory Status: Walker Discharge Destination: Home Transportation: Private Auto Accompanied By: daughter in law Schedule Follow-up Appointment: Yes Clinical Summary of Care: Electronic Signature(s) Signed: 04/14/2021 4:12:32 PM By: Donnamarie Poag Entered By: Donnamarie Poag on 04/14/2021 16:12:02 [name] ([id number]) -------------------------------------------------------------------------------- Lower  Extremity Assessment Details Patient Name: [name], [name] Date of Service: 04/14/2021 3:15 PM Medical Record Number: [id number] Patient Account Number: [id number] Date of Birth/Sex: 1928-02-05 (87 y.o. F) Treating RN: Donnamarie Poag Primary Care Gerianne Simonet: Viviana Simpler Other Clinician: Referring Amrit Cress: Viviana Simpler Treating Ladon Heney/Extender: Skipper Cliche in Treatment: 25 Edema Assessment Assessed: [Left: No] Patrice Paradise: Yes] [Left: Edema] [Right: :] Calf Left: Right: Point of Measurement: 33 cm From Medial Instep 22 cm Ankle Left: Right: Point of Measurement: 9 cm From Medial Instep 15.5 cm Vascular Assessment Pulses: Dorsalis Pedis Palpable: [Right:Yes] Electronic Signature(s) Signed: 04/14/2021 4:12:32 PM By: Donnamarie Poag Entered By: Donnamarie Poag on 04/14/2021 15:47:40 Kellman, [name] ([id number]) -------------------------------------------------------------------------------- Multi Wound Chart Details Patient Name: [name] Date of Service: 04/14/2021 3:15 PM Medical Record Number: [id number] Patient Account Number: [id number] Date of Birth/Sex: May 27, 1928 (87 y.o. F) Treating RN: Donnamarie Poag Primary Care Analeia Ismael: Viviana Simpler Other Clinician: Referring Clearence Vitug: Viviana Simpler Treating Lakysha Kossman/Extender: Skipper Cliche in Treatment: 25 Vital Signs Height(in): 66 Pulse(bpm): 69 Weight(lbs): 76 Blood Pressure(mmHg): 143/53 Body Mass Index(BMI): 12 Temperature(F): 98.3 Respiratory Rate(breaths/min): 16 Photos: [N/A:N/A] Wound Location: Right, Lateral Malleolus N/A N/A Wounding Event: Gradually Appeared N/A N/A Primary Etiology: Pressure Ulcer N/A N/A Comorbid History: Arrhythmia, Coronary Artery N/A N/A Disease, Hypertension, Peripheral Arterial Disease, History of pressure wounds, Osteoarthritis Date Acquired: 10/22/2014 N/A N/A Weeks of Treatment: 25 N/A N/A Wound Status: Open N/A N/A Measurements L x W x D (cm) 0.2x0.4x0.1 N/A N/A Area  (cm) : 0.063 N/A N/A Volume (cm) : 0.006 N/A N/A % Reduction in Area: 92.00% N/A N/A % Reduction in Volume: 92.40% N/A N/A Classification: Category/Stage III N/A N/A Exudate Amount: Medium N/A N/A Exudate Type: Serous N/A N/A Exudate Color: amber N/A N/A Wound Margin: Flat and Intact N/A N/A Granulation Amount: Large (67-100%) N/A N/A Granulation Quality: Red, Pink N/A N/A Necrotic Amount: Small (1-33%) N/A N/A Exposed Structures: Fat Layer (Subcutaneous Tissue): N/A N/A Yes Fascia: No Tendon: No Muscle: No Joint: No Bone: No Epithelialization: Small (1-33%) N/A N/A Treatment Notes Electronic Signature(s) Signed: 04/14/2021 4:12:32 PM By: Donnamarie Poag Entered By: Donnamarie Poag on 04/14/2021 15:53:49 Ring, [name] ([id number]) -------------------------------------------------------------------------------- [place] Details Patient Name: [name], [name] Date of Service: 04/14/2021 3:15 PM Medical Record Number: [id number] Patient Account Number: [id number]  Date of Birth/Sex: 13-Jul-1927 (87 y.o. F) Treating RN: Donnamarie Poag Primary Care Nikolus Marczak: Viviana Simpler Other Clinician: Referring Lyndell Gillyard: Viviana Simpler Treating Vietta Bonifield/Extender: Skipper Cliche in Treatment: 25 Active Inactive Electronic Signature(s) Signed: 04/14/2021 4:12:32 PM By: Donnamarie Poag Entered By: Donnamarie Poag on 04/14/2021 15:47:55 Topor, [name] ([id number]) -------------------------------------------------------------------------------- Pain Assessment Details Patient Name: [name], [name] Date of Service: 04/14/2021 3:15 PM Medical Record Number: [id number] Patient Account Number: [id number] Date of Birth/Sex: 06/01/1928 (87 y.o. F) Treating RN: Donnamarie Poag Primary Care Cherisa Brucker: Viviana Simpler Other Clinician: Referring Anaston Koehn: Viviana Simpler Treating Alima Naser/Extender: Skipper Cliche in Treatment: 25 Active Problems Location of Pain Severity and Description of  Pain Patient Has Paino No Site Locations Rate the pain. Current Pain Level: 0 Pain Management and Medication Current Pain Management: Electronic Signature(s) Signed: 04/14/2021 4:12:32 PM By: Donnamarie Poag Entered By: Donnamarie Poag on 04/14/2021 15:04:44 Persichetti, [name] ([id number]) -------------------------------------------------------------------------------- Patient/Caregiver Education Details Patient Name: [name], [name] Date of Service: 04/14/2021 3:15 PM Medical Record Number: [id number] Patient Account Number: [id number] Date of Birth/Gender: [birth year] (87 y.o. F) Treating RN: Donnamarie Poag Primary Care Physician: Viviana Simpler Other Clinician: Referring Physician: Viviana Simpler Treating Physician/Extender: Skipper Cliche in Treatment: 25 Education Assessment Education Provided To: Patient Education Topics Provided Basic Hygiene: Wound/Skin Impairment: Electronic Signature(s) Signed: 04/14/2021 4:12:32 PM By: Donnamarie Poag Entered By: Donnamarie Poag on 04/14/2021 16:09:00 [name], [name] ([id number]) -------------------------------------------------------------------------------- Wound Assessment Details Patient Name: [name], [name] Date of Service: 04/14/2021 3:15 PM Medical Record Number: [id number] Patient Account Number: [id number] Date of Birth/Sex: 08-Sep-1927 (87 y.o. F) Treating RN: Donnamarie Poag Primary Care Larysa Pall: Viviana Simpler Other Clinician: Referring Cashel Bellina: Viviana Simpler Treating Achille Xiang/Extender: Skipper Cliche in Treatment: 25 Wound Status Wound Number: 1 Primary Pressure Ulcer Etiology: Wound Location: Right, Lateral Malleolus Wound Open Wounding Event: Gradually Appeared Status: Date Acquired: 10/22/2014 Comorbid Arrhythmia, Coronary Artery Disease, Hypertension, Weeks Of Treatment: 25 History: Peripheral Arterial Disease, History of pressure wounds, Clustered Wound: No Osteoarthritis Photos Wound Measurements Length: (cm)  0.2 Width: (cm) 0.4 Depth: (cm) 0.1 Area: (cm) 0.063 Volume: (cm) 0.006 % Reduction in Area: 92% % Reduction in Volume: 92.4% Epithelialization: Small (1-33%) Wound Description Classification: Category/Stage III Wound Margin: Flat and Intact Exudate Amount: Medium Exudate Type: Serous Exudate Color: amber Foul Odor After Cleansing: No Slough/Fibrino Yes Wound Bed Granulation Amount: Large (67-100%) Exposed Structure Granulation Quality: Red, Pink Fascia Exposed: No Necrotic Amount: Small (1-33%) Fat Layer (Subcutaneous Tissue) Exposed: Yes Necrotic Quality: Adherent Slough Tendon Exposed: No Muscle Exposed: No Joint Exposed: No Bone Exposed: No Treatment Notes Wound #1 (Malleolus) Wound Laterality: Right, Lateral Cleanser Soap and Water Discharge Instruction: Gently cleanse wound with antibacterial soap, rinse and pat dry prior to dressing wounds [name], [name] ([id number]) Peri-Wound Care Topical Primary Dressing Hydrofera Blue Ready Transfer Foam, 2.5x2.5 (in/in) Discharge Instruction: Apply Hydrofera Blue Ready to wound bed as directed Secondary Dressing Gauze Discharge Instruction: As directed: dry, moistened with saline or moistened with Dakins Solution Secured With Compression Wrap Profore Lite LF 3 Multilayer Compression Bandaging System Discharge Instruction: Apply 3 multi-layer wrap as prescribed. Compression Stockings Add-Ons Electronic Signature(s) Signed: 04/14/2021 4:12:32 PM By: Donnamarie Poag Entered By: Donnamarie Poag on 04/14/2021 15:46:57 [place], [name] ([id number]) -------------------------------------------------------------------------------- [place] Details Patient Name: [name] Date of Service: 04/14/2021 3:15 PM Medical Record Number: [id number] Patient Account Number: [id number] Date of Birth/Sex: 29-Jun-1928 (87 y.o. F) Treating RN: Donnamarie Poag Primary Care Jonthan Leite: Viviana Simpler Other Clinician: Referring Xaniyah Buchholz: Viviana Simpler  Treating Alejandro Gamel/Extender: Jeri Cos Weeks in Treatment: 25 Vital Signs Time Taken: 15:03 Temperature (F): 98.3 Height (in): 66 Pulse (bpm): 69 Weight (lbs): 76 Respiratory Rate (breaths/min): 16 Body Mass Index (BMI): 12.3 Blood Pressure (mmHg): 143/53 Reference Range: 80 - 120 mg / dl Electronic Signature(s) Signed: 04/14/2021 4:12:32 PM By: Donnamarie Poag Entered ByDonnamarie Poag on 04/14/2021 15:04:12

## 2021-04-14 NOTE — Progress Notes (Addendum)
Elizabeth, Downs ([id number]) Visit Report for 04/14/2021 Chief Complaint Document Details Patient Name: [name], [name]. Date of Service: 04/14/2021 3:15 PM Medical Record Number: [id number] Patient Account Number: [id number] Date of Birth/Sex: [birth year] (87 y.o. F) Treating RN: Donnamarie Poag Primary Care Provider: Viviana Simpler Other Clinician: Referring Provider: Viviana Simpler Treating Provider/Extender: Skipper Cliche in Treatment: 25 Information Obtained from: Patient Chief Complaint Pressure ulcer right ankle and left heel and right ischial/buttock pressure ulcer Electronic Signature(s) Signed: 04/14/2021 3:36:49 PM By: Worthy Keeler PA-C Entered By: Worthy Keeler on 04/14/2021 15:36:49 Grimaldo, [name] ([id number]) -------------------------------------------------------------------------------- HPI Details Patient Name: [name], [name] Date of Service: 04/14/2021 3:15 PM Medical Record Number: [id number] Patient Account Number: [id number] Date of Birth/Sex: [birth year] (87 y.o. F) Treating RN: Donnamarie Poag Primary Care Provider: Viviana Simpler Other Clinician: Referring Provider: Viviana Simpler Treating Provider/Extender: Skipper Cliche in Treatment: 25 History of Present Illness HPI Description: 10/21/2020 upon evaluation today patient presents for initial inspection here in our clinic concerning issues that she has been having quite some time in regard to her ankle and since she has been in the hospital with regard to the heel. The ankle in fact has been 5-6 years at least I am told. With that being said the heel ulcer occurred when she was in the hospital in December for hip surgery when she fractured her hip. She also was in the hospital Tuesday for altered mental status. Really there was nothing that was identified as the cause for this. She did have a. Fortunately there does not appear to be any signs of infection she tells me that she has had she believes arterial  studies At [facility] clinic I could not find Those studies at this point. Nonetheless I will continue to look and see what I can find before Then. The patient also has a skin tear on her arm this occurred more recently when she bumped this at home. The patient does have a history of hypertension, coronary artery disease, and peripheral vascular disease stated. 10/28/2020 I was able to find the patient's chart currently which shows that she did have an arterial study performed in May 2019. This showed that she had a abnormal right toe brachial index and a normal left toe brachial index. She was noncompressible as far as ABIs were concerned. She did appear to have triphasic flow at that time. Unfortunately the wound that is commented on in the report that I printed off and read mentions the same wound on the ankle that we are still dealing with at this point. Unfortunately this has not healed. And its been quite sometime about 3 years now. Fortunately there does not appear to be any signs of active infection systemically at this point. I think that the patient has done well with the Santyl over the past week which is good news. Patient's caregiver which is her daughter-in-law is concerned about the fact that she really does not feel qualified to be able to change the dressings and take care of this issue. There does not appear to be any signs of anything untoward going on at this point. I think she is done a great job applying the [employer] and I think that has done a great job for the patient is for soften up some of the necrotic tissue. With that being said I think the Xeroform on the arm also has done excellent. In general I am very pleased with where we stand. And I told the patient's  daughter-in- law as well that I also feel like she has done a great job over the past week taking care of her mother. 11/11/2020 upon evaluation today patient appears to be doing well with regard to her wounds. She has been  tolerating the dressing changes without complication her daughters been applying the Santyl which has done a great job. Ho with that being said I think now that we have good arterial study showing we can go ahead and proceed with sharp debridement at this point. 11/25/2020 upon evaluation today patient appears to be doing well with regard to her wounds. The Santyl has really helped to clean things up and overall she is doing quite excellent at this point. Fortunately there does not appear to be any signs of infection which is great news and in general extremely pleased. I do believe some debridement is in order and hopefully will be able to get her into a collagen dressing after this. 12/23/2020 upon evaluation today patient appears to be doing well with regard to her wound all things considered. Unfortunately it does sound like her daughter decided to let this air out because she felt like it was getting so wet. It sounds like she got border gauze dressings instead of border foam therefore there is really Nothing to catch the excess drainage which I think is the problem they were worried about the smell. Fortunately there does not appear to be any signs of active infection which is great news. Nonetheless I do not think we want to leave this just open to air. 01/13/2021 upon evaluation today patient's wounds actually appear to be about as good as have seen since have been taking care of her. Fortunately there does not appear to be any evidence of infection which is great and overall the biggest issue I see is that of fluid buildup. I think we need to do something to try to help manage this. I think if we can control her edema we get the wounds to heal more effectively. 01/27/2021 upon evaluation today patient appears to be doing well in regard to the wounds on her right lateral malleolus as well as the left heel and the right ischial tuberosity is unfortunately a new area that has arisen since we last saw  her. She tells me currently that that is from sitting too much. With that being said this actually appears to be doing the worst of anything so far that I see today. Fortunately there does not appear to be any signs of infection this is at least good news. Compression wrap seem to be doing excellent for her as far as the legs are concerned. 02/03/2021 upon evaluation today patient appears to be doing well with regard to all of her wounds. She has been tolerating the dressing changes without complication. Fortunately there is no signs of active infection at this time. No fevers, chills, nausea, vomiting, or diarrhea. 02/10/2021 upon evaluation today patient appears to be doing well with regard to her wounds. With that being said there is some slight evidence of hypergranulation in regard to the right ankle in particular and a little bit in regard to the left heel. I think Hydrofera Blue might be a better option to go to at this point based on what I am seeing especially since both of these wounds in particular seem to be a little bit stalled. I discussed that with the patient and her daughter today. With regard to the hip this is doing great with the  collagen I think will get very close to complete closure I recommend we continue with the collagen. 02/17/2021 upon evaluation today patient appears to be doing excellent in regard to her heel ulcers. She has been tolerating the dressing changes without complication and overall I am extremely pleased with where things stand today. There does not appear to be any signs of active infection which is great news. Overall I may think that we are headed in the proper direction based on what I am seeing. 02/24/2021 upon evaluation today patient's wounds actually are showing signs of improvement in regard to her heels both are doing awesome. In regard to her hip location this is actually reopened after being closed last week and to be perfectly honest I am more concerned  here about the fact that pressures causing this issue I do not think it has anything to do with her not having the collagen in place again it was completely healed last week there was no reason for the collagen to be there. 03/03/2021 upon evaluation today patient appears to be doing well with regard to her wounds. Everything is showing signs of improvement and [name], [name]. ([id number]) doing some much better as far as the overall size of the wound. Fortunately I think we are headed in the right direction. 03/10/2021 upon evaluation today patient appears to be doing well with regard to her wound. She is tolerating the dressing changes without complication. The left heel pretty much appears to be almost completely healed although I think it is probably can be 1 more week before I can call this 100% slough. The right ankle is significantly improved with that being said its not completely closed. With regard to the right hip this is completely closed. Overall I am very pleased with where things stand I think were getting very close to complete resolution. I discussed all this with the patient and her daughter-in-law today. 03/17/2021 upon evaluation today patient appears to be doing well with regard to her wounds. Fortunately there does not appear to be any signs of active infection which is great news and overall very pleased with where the patient stands today. I am in general thankful that overall she is making great progress here. There is good to be a little bit of debridement here to clear away some of the necrotic debris today on both wound locations. 03/24/2021 upon evaluation today patient appears to be doing excellent in regard to her wound. She has been tolerating the dressing changes without complication. Fortunately there does not appear to be any evidence of infection the left foot is completely healed this is great news. On the right at the ankle region this is still open though I do not  think the alginate did quite as well as I was hoping as far as getting this dried out. I think that we may just want to switch back to the [facility] which has done well up to this point. I was just hopeful this would wrap things up completely for [place]. 03/31/2021 upon evaluation today patient appears to be doing well with regard to her wound. This is on the right side which appears to be doing better than last week I think is definitely measuring smaller and this is great news. Overall I am very pleased with where we stand today. I do believe the [facility] is doing better for her. 04/14/2021 upon evaluation today patient appears to be doing well with regard to her wound. I am very  pleased with the way it stands I think that she is headed in the right direction. Unfortunately she is having issues with the Tubigrip neither she nor her daughter-in-law who is the primary caregiver can get this on. Subsequently that means that they have been having a very difficult time with complying with the necessary things for this left leg. With that being said I am really not sure what to do to help in that regard. We can always try a bigger that is larger size Tubigrip but at the same time that means that she may not get as good compression and it may not keep things under control the only way to know is to try. Electronic Signature(s) Signed: 04/14/2021 5:50:21 PM By: Worthy Keeler PA-C Entered By: Worthy Keeler on 04/14/2021 17:50:21 [name], [name] ([id number]) -------------------------------------------------------------------------------- Physical Exam Details Patient Name: [name], [name] Date of Service: 04/14/2021 3:15 PM Medical Record Number: [id number] Patient Account Number: [id number] Date of Birth/Sex: March 04, 1928 (87 y.o. F) Treating RN: Donnamarie Poag Primary Care Provider: Viviana Simpler Other Clinician: Referring Provider: Viviana Simpler Treating Provider/Extender: Skipper Cliche  in Treatment: 42 Constitutional Well-nourished and well-hydrated in no acute distress. Respiratory normal breathing without difficulty. Psychiatric this patient is able to make decisions and demonstrates good insight into disease process. Alert and Oriented x 3. pleasant and cooperative. Notes Upon inspection patient's wound bed actually showed signs of good granulation and epithelization at this point. Fortunately there does not appear to be any evidence of active infection which is great news and overall I am extremely pleased in that regard. I think she is close to healing in regard to the right ankle ulcer. On the left she continues to maintain good control of the edema with the Tubigrip but again they say it is very hard to get on so therefore we can try a larger size and see if that could be beneficial for her without causing any compromise of the edema control. Electronic Signature(s) Signed: 04/14/2021 5:50:53 PM By: Worthy Keeler PA-C Entered By: Worthy Keeler on 04/14/2021 17:50:53 [name], [name] ([id number]) -------------------------------------------------------------------------------- Physician Orders Details Patient Name: [name], [name] Date of Service: 04/14/2021 3:15 PM Medical Record Number: [id number] Patient Account Number: [id number] Date of Birth/Sex: Nov 19, 1927 (87 y.o. F) Treating RN: Donnamarie Poag Primary Care Provider: Viviana Simpler Other Clinician: Referring Provider: Viviana Simpler Treating Provider/Extender: Skipper Cliche in Treatment: 25 Verbal / Phone Orders: No Diagnosis Coding ICD-10 Coding Code Description L89.513 Pressure ulcer of right ankle, stage 3 L89.623 Pressure ulcer of left heel, stage 3 S51.802A Unspecified open wound of left forearm, initial encounter L89.312 Pressure ulcer of right buttock, stage 2 I10 Essential (primary) hypertension I25.10 Atherosclerotic heart disease of native coronary artery without angina pectoris I73.89  Other specified peripheral vascular diseases Follow-up Appointments o Return Appointment in 1 week. o Nurse Visit as needed Bathing/ Shower/ Hygiene o May shower with wound dressing protected with water repellent cover or cast protector. o No tub bath. Edema Control - Lymphedema / Segmental Compressive Device / Other Bilateral Lower Extremities o Optional: One layer of unna paste to top of compression wrap (to act as an anchor). o Tubigrip single layer applied. - left lower leg size C o Elevate legs to the level of the heart and pump ankles as often as possible o Elevate leg(s) parallel to the floor when sitting. o DO YOUR BEST to sleep in the bed at night. DO NOT sleep in your recliner. Long hours  of sitting in a recliner leads to swelling of the legs and/or potential wounds on your backside. Off-Loading o Gel wheelchair cushion - prop when sitting or laying to avoid pressure on your hip wound o Turn and reposition every 2 hours o Other: - Border foam dressing for protection to right hip Wound Treatment Wound #1 - Malleolus Wound Laterality: Right, Lateral Cleanser: Soap and Water 1 x Per Week/30 Days Discharge Instructions: Gently cleanse wound with antibacterial soap, rinse and pat dry prior to dressing wounds Primary Dressing: Hydrofera Blue Ready Transfer Foam, 2.5x2.5 (in/in) 1 x Per Week/30 Days Discharge Instructions: Apply Hydrofera Blue Ready to wound bed as directed Secondary Dressing: Gauze 1 x Per Week/30 Days Discharge Instructions: As directed: dry, moistened with saline or moistened with Dakins Solution Compression Wrap: Profore Lite LF 3 Multilayer Compression Bandaging System 1 x Per Week/30 Days Discharge Instructions: Apply 3 multi-layer wrap as prescribed. Electronic Signature(s) Signed: 04/14/2021 4:12:32 PM By: Donnamarie Poag Signed: 04/14/2021 6:01:56 PM By: Worthy Keeler PA-C Entered By: Donnamarie Poag on 04/14/2021 16:10:00 [name], [name]  ([name], [name] ([id number]) -------------------------------------------------------------------------------- Problem List Details Patient Name: [name], [name] Date of Service: 04/14/2021 3:15 PM Medical Record Number: [id number] Patient Account Number: [id number] Date of Birth/Sex: 1928/02/17 (87 y.o. F) Treating RN: Donnamarie Poag Primary Care Provider: Viviana Simpler Other Clinician: Referring Provider: Viviana Simpler Treating Provider/Extender: Skipper Cliche in Treatment: 25 Active Problems ICD-10 Encounter Code Description Active Date MDM Diagnosis L89.513 Pressure ulcer of right ankle, stage 3 10/21/2020 No Yes L89.623 Pressure ulcer of left heel, stage 3 10/21/2020 No Yes S51.802A Unspecified open wound of left forearm, initial encounter 10/21/2020 No Yes L89.312 Pressure ulcer of right buttock, stage 2 01/27/2021 No Yes I10 Essential (primary) hypertension 10/21/2020 No Yes I25.10 Atherosclerotic heart disease of native coronary artery without angina 10/21/2020 No Yes pectoris I73.89 Other specified peripheral vascular diseases 10/21/2020 No Yes Inactive Problems Resolved Problems Electronic Signature(s) Signed: 04/14/2021 3:36:41 PM By: Worthy Keeler PA-C Entered By: Worthy Keeler on 04/14/2021 15:36:41 [name], [name] ([id number]) -------------------------------------------------------------------------------- Progress Note Details Patient Name: [name] Date of Service: 04/14/2021 3:15 PM Medical Record Number: [id number] Patient Account Number: [id number] Date of Birth/Sex: 1927-08-19 (87 y.o. F) Treating RN: Donnamarie Poag Primary Care Provider: Viviana Simpler Other Clinician: Referring Provider: Viviana Simpler Treating Provider/Extender: Skipper Cliche in Treatment: 25 Subjective Chief Complaint Information obtained from Patient Pressure ulcer right ankle and left heel and right ischial/buttock pressure ulcer History of Present Illness  (HPI) 10/21/2020 upon evaluation today patient presents for initial inspection here in our clinic concerning issues that she has been having quite some time in regard to her ankle and since she has been in the hospital with regard to the heel. The ankle in fact has been 5-6 years at least I am told. With that being said the heel ulcer occurred when she was in the hospital in December for hip surgery when she fractured her hip. She also was in the hospital Tuesday for altered mental status. Really there was nothing that was identified as the cause for this. She did have a. Fortunately there does not appear to be any signs of infection she tells me that she has had she believes arterial studies At [facility] clinic I could not find Those studies at this point. Nonetheless I will continue to look and see what I can find before Then. The patient also has a skin tear on her arm this occurred more  recently when she bumped this at home. The patient does have a history of hypertension, coronary artery disease, and peripheral vascular disease stated. 10/28/2020 I was able to find the patient's chart currently which shows that she did have an arterial study performed in May 2019. This showed that she had a abnormal right toe brachial index and a normal left toe brachial index. She was noncompressible as far as ABIs were concerned. She did appear to have triphasic flow at that time. Unfortunately the wound that is commented on in the report that I printed off and read mentions the same wound on the ankle that we are still dealing with at this point. Unfortunately this has not healed. And its been quite sometime about 3 years now. Fortunately there does not appear to be any signs of active infection systemically at this point. I think that the patient has done well with the Santyl over the past week which is good news. Patient's caregiver which is her daughter-in-law is concerned about the fact that she really does  not feel qualified to be able to change the dressings and take care of this issue. There does not appear to be any signs of anything untoward going on at this point. I think she is done a great job applying the [employer] and I think that has done a great job for the patient is for soften up some of the necrotic tissue. With that being said I think the Xeroform on the arm also has done excellent. In general I am very pleased with where we stand. And I told the patient's daughter-in- law as well that I also feel like she has done a great job over the past week taking care of her mother. 11/11/2020 upon evaluation today patient appears to be doing well with regard to her wounds. She has been tolerating the dressing changes without complication her daughters been applying the Santyl which has done a great job. Ho with that being said I think now that we have good arterial study showing we can go ahead and proceed with sharp debridement at this point. 11/25/2020 upon evaluation today patient appears to be doing well with regard to her wounds. The Santyl has really helped to clean things up and overall she is doing quite excellent at this point. Fortunately there does not appear to be any signs of infection which is great news and in general extremely pleased. I do believe some debridement is in order and hopefully will be able to get her into a collagen dressing after this. 12/23/2020 upon evaluation today patient appears to be doing well with regard to her wound all things considered. Unfortunately it does sound like her daughter decided to let this air out because she felt like it was getting so wet. It sounds like she got border gauze dressings instead of border foam therefore there is really Nothing to catch the excess drainage which I think is the problem they were worried about the smell. Fortunately there does not appear to be any signs of active infection which is great news. Nonetheless I do not think we  want to leave this just open to air. 01/13/2021 upon evaluation today patient's wounds actually appear to be about as good as have seen since have been taking care of her. Fortunately there does not appear to be any evidence of infection which is great and overall the biggest issue I see is that of fluid buildup. I think we need to do something to try  to help manage this. I think if we can control her edema we get the wounds to heal more effectively. 01/27/2021 upon evaluation today patient appears to be doing well in regard to the wounds on her right lateral malleolus as well as the left heel and the right ischial tuberosity is unfortunately a new area that has arisen since we last saw her. She tells me currently that that is from sitting too much. With that being said this actually appears to be doing the worst of anything so far that I see today. Fortunately there does not appear to be any signs of infection this is at least good news. Compression wrap seem to be doing excellent for her as far as the legs are concerned. 02/03/2021 upon evaluation today patient appears to be doing well with regard to all of her wounds. She has been tolerating the dressing changes without complication. Fortunately there is no signs of active infection at this time. No fevers, chills, nausea, vomiting, or diarrhea. 02/10/2021 upon evaluation today patient appears to be doing well with regard to her wounds. With that being said there is some slight evidence of hypergranulation in regard to the right ankle in particular and a little bit in regard to the left heel. I think Hydrofera Blue might be a better option to go to at this point based on what I am seeing especially since both of these wounds in particular seem to be a little bit stalled. I discussed that with the patient and her daughter today. With regard to the hip this is doing great with the collagen I think will get very close to complete closure I recommend we  continue with the collagen. 02/17/2021 upon evaluation today patient appears to be doing excellent in regard to her heel ulcers. She has been tolerating the dressing changes without complication and overall I am extremely pleased with where things stand today. There does not appear to be any signs of active infection which is great news. Overall I may think that we are headed in the proper direction based on what I am seeing. 02/24/2021 upon evaluation today patient's wounds actually are showing signs of improvement in regard to her heels both are doing awesome. In [place], [place] ([id number]) regard to her hip location this is actually reopened after being closed last week and to be perfectly honest I am more concerned here about the fact that pressures causing this issue I do not think it has anything to do with her not having the collagen in place again it was completely healed last week there was no reason for the collagen to be there. 03/03/2021 upon evaluation today patient appears to be doing well with regard to her wounds. Everything is showing signs of improvement and doing some much better as far as the overall size of the wound. Fortunately I think we are headed in the right direction. 03/10/2021 upon evaluation today patient appears to be doing well with regard to her wound. She is tolerating the dressing changes without complication. The left heel pretty much appears to be almost completely healed although I think it is probably can be 1 more week before I can call this 100% slough. The right ankle is significantly improved with that being said its not completely closed. With regard to the right hip this is completely closed. Overall I am very pleased with where things stand I think were getting very close to complete resolution. I discussed all this with the patient and her  daughter-in-law today. 03/17/2021 upon evaluation today patient appears to be doing well with regard to her wounds.  Fortunately there does not appear to be any signs of active infection which is great news and overall very pleased with where the patient stands today. I am in general thankful that overall she is making great progress here. There is good to be a little bit of debridement here to clear away some of the necrotic debris today on both wound locations. 03/24/2021 upon evaluation today patient appears to be doing excellent in regard to her wound. She has been tolerating the dressing changes without complication. Fortunately there does not appear to be any evidence of infection the left foot is completely healed this is great news. On the right at the ankle region this is still open though I do not think the alginate did quite as well as I was hoping as far as getting this dried out. I think that we may just want to switch back to the [facility] which has done well up to this point. I was just hopeful this would wrap things up completely for [place]. 03/31/2021 upon evaluation today patient appears to be doing well with regard to her wound. This is on the right side which appears to be doing better than last week I think is definitely measuring smaller and this is great news. Overall I am very pleased with where we stand today. I do believe the [facility] is doing better for her. 04/14/2021 upon evaluation today patient appears to be doing well with regard to her wound. I am very pleased with the way it stands I think that she is headed in the right direction. Unfortunately she is having issues with the Tubigrip neither she nor her daughter-in-law who is the primary caregiver can get this on. Subsequently that means that they have been having a very difficult time with complying with the necessary things for this left leg. With that being said I am really not sure what to do to help in that regard. We can always try a bigger that is larger size Tubigrip but at the same time that means that she may not get  as good compression and it may not keep things under control the only way to know is to try. Objective Constitutional Well-nourished and well-hydrated in no acute distress. Vitals Time Taken: 3:03 PM, Height: 66 in, Weight: 76 lbs, BMI: 12.3, Temperature: 98.3 F, Pulse: 69 bpm, Respiratory Rate: 16 breaths/min, Blood Pressure: 143/53 mmHg. Respiratory normal breathing without difficulty. Psychiatric this patient is able to make decisions and demonstrates good insight into disease process. Alert and Oriented x 3. pleasant and cooperative. General Notes: Upon inspection patient's wound bed actually showed signs of good granulation and epithelization at this point. Fortunately there does not appear to be any evidence of active infection which is great news and overall I am extremely pleased in that regard. I think she is close to healing in regard to the right ankle ulcer. On the left she continues to maintain good control of the edema with the Tubigrip but again they say it is very hard to get on so therefore we can try a larger size and see if that could be beneficial for her without causing any compromise of the edema control. Integumentary (Hair, Skin) Wound #1 status is Open. Original cause of wound was Gradually Appeared. The date acquired was: 10/22/2014. The wound has been in treatment 25 weeks. The wound is located on the Right,Lateral  Malleolus. The wound measures 0.2cm length x 0.4cm width x 0.1cm depth; 0.063cm^2 area and 0.006cm^3 volume. There is Fat Layer (Subcutaneous Tissue) exposed. There is a medium amount of serous drainage noted. The wound margin is flat and intact. There is large (67-100%) red, pink granulation within the wound bed. There is a small (1-33%) amount of necrotic tissue within the wound bed including Adherent Slough. Assessment [name], [name] ([id number]) Active Problems ICD-10 Pressure ulcer of right ankle, stage 3 Pressure ulcer of left heel, stage  3 Unspecified open wound of left forearm, initial encounter Pressure ulcer of right buttock, stage 2 Essential (primary) hypertension Atherosclerotic heart disease of native coronary artery without angina pectoris Other specified peripheral vascular diseases Procedures Wound #1 Pre-procedure diagnosis of Wound #1 is a Pressure Ulcer located on the Right,Lateral Malleolus . There was a Three Layer Compression Therapy Procedure by Donnamarie Poag, RN. Post procedure Diagnosis Wound #1: Same as Pre-Procedure Plan Follow-up Appointments: Return Appointment in 1 week. Nurse Visit as needed Bathing/ Shower/ Hygiene: May shower with wound dressing protected with water repellent cover or cast protector. No tub bath. Edema Control - Lymphedema / Segmental Compressive Device / Other: Optional: One layer of unna paste to top of compression wrap (to act as an anchor). Tubigrip single layer applied. - left lower leg size C Elevate legs to the level of the heart and pump ankles as often as possible Elevate leg(s) parallel to the floor when sitting. DO YOUR BEST to sleep in the bed at night. DO NOT sleep in your recliner. Long hours of sitting in a recliner leads to swelling of the legs and/or potential wounds on your backside. Off-Loading: Gel wheelchair cushion - prop when sitting or laying to avoid pressure on your hip wound Turn and reposition every 2 hours Other: - Border foam dressing for protection to right hip WOUND #1: - Malleolus Wound Laterality: Right, Lateral Cleanser: Soap and Water 1 x Per Week/30 Days Discharge Instructions: Gently cleanse wound with antibacterial soap, rinse and pat dry prior to dressing wounds Primary Dressing: Hydrofera Blue Ready Transfer Foam, 2.5x2.5 (in/in) 1 x Per Week/30 Days Discharge Instructions: Apply Hydrofera Blue Ready to wound bed as directed Secondary Dressing: Gauze 1 x Per Week/30 Days Discharge Instructions: As directed: dry, moistened with  saline or moistened with Dakins Solution Compression Wrap: Profore Lite LF 3 Multilayer Compression Bandaging System 1 x Per Week/30 Days Discharge Instructions: Apply 3 multi-layer wrap as prescribed. 1. Would recommend currently that we continue with the [facility] for the ankle ulcer they are in agreement with the plan. 2. We will also continue to wrap the leg. 3. I would also suggest that we continue specifically with a 3 layer compression wrap for the right leg and in regard to the left leg we going to continue with Tubigrip that we use size D I am not certain this is good to help as much though. We will see patient back for reevaluation in 1 week here in the clinic. If anything worsens or changes patient will contact our office for additional recommendations. Electronic Signature(s) Signed: 04/14/2021 5:51:27 PM By: Worthy Keeler PA-C Entered By: Worthy Keeler on 04/14/2021 17:51:26 [name], [name] ([id number]) -------------------------------------------------------------------------------- SuperBill Details Patient Name: SIMREN, POPSON Date of Service: 04/14/2021 Medical Record Number: [id number] Patient Account Number: [id number] Date of Birth/Sex: 1928-04-14 (87 y.o. F) Treating RN: Donnamarie Poag Primary Care Provider: Viviana Simpler Other Clinician: Referring Provider: Viviana Simpler Treating Provider/Extender: Skipper Cliche in  Treatment: 25 Diagnosis Coding ICD-10 Codes Code Description L89.513 Pressure ulcer of right ankle, stage 3 L89.623 Pressure ulcer of left heel, stage 3 S51.802A Unspecified open wound of left forearm, initial encounter L89.312 Pressure ulcer of right buttock, stage 2 I10 Essential (primary) hypertension I25.10 Atherosclerotic heart disease of native coronary artery without angina pectoris I73.89 Other specified peripheral vascular diseases Facility Procedures CPT4 Code: 76195093 Description: (Facility Use Only) [phone] - [place] Modifier: Quantity: 1 Physician Procedures CPT4 Code: 8099833 Description: 82505 - WC PHYS LEVEL 4 - EST PT Modifier: Quantity: 1 CPT4 Code: Description: ICD-10 Diagnosis Description L89.513 Pressure ulcer of right ankle, stage 3 L89.623 Pressure ulcer of left heel, stage 3 S51.802A Unspecified open wound of left forearm, initial encounter L89.312 Pressure ulcer of right buttock, stage 2 Modifier: Quantity: Electronic Signature(s) Signed: 04/14/2021 5:51:51 PM By: Worthy Keeler PA-C Previous Signature: 04/14/2021 4:12:32 PM Version By: Donnamarie Poag Entered By: Worthy Keeler on 04/14/2021 17:51:51

## 2021-04-21 ENCOUNTER — Encounter: Payer: PPO | Admitting: Physician Assistant

## 2021-04-21 ENCOUNTER — Other Ambulatory Visit: Payer: Self-pay

## 2021-04-21 DIAGNOSIS — L89513 Pressure ulcer of right ankle, stage 3: Secondary | ICD-10-CM | POA: Diagnosis not present

## 2021-04-21 NOTE — Progress Notes (Addendum)
[name], [name] ([id number]) Visit Report for 04/21/2021 Chief Complaint Document Details Patient Name: [name], [name]. Date of Service: 04/21/2021 3:30 PM Medical Record Number: [id number] Patient Account Number: [id number] Date of Birth/Sex: 10-19-1927 (87 y.o. F) Treating RN: Dolan Amen Primary Care Provider: Viviana Simpler Other Clinician: Referring Provider: Viviana Simpler Treating Provider/Extender: Skipper Cliche in Treatment: 26 Information Obtained from: Patient Chief Complaint Pressure ulcer right ankle Electronic Signature(s) Signed: 04/21/2021 3:09:00 PM By: Worthy Keeler PA-C Previous Signature: 04/21/2021 3:08:07 PM Version By: Worthy Keeler PA-C Previous Signature: 04/21/2021 3:07:39 PM Version By: Worthy Keeler PA-C Entered By: Worthy Keeler on 04/21/2021 15:09:00 [name], [name] ([id number]) -------------------------------------------------------------------------------- HPI Details Patient Name: [name], [name] Date of Service: 04/21/2021 3:30 PM Medical Record Number: [id number] Patient Account Number: [id number] Date of Birth/Sex: Feb 09, 1928 (87 y.o. F) Treating RN: Dolan Amen Primary Care Provider: Viviana Simpler Other Clinician: Referring Provider: Viviana Simpler Treating Provider/Extender: Skipper Cliche in Treatment: 26 History of Present Illness HPI Description: 10/21/2020 upon evaluation today patient presents for initial inspection here in our clinic concerning issues that she has been having quite some time in regard to her ankle and since she has been in the hospital with regard to the heel. The ankle in fact has been 5-6 years at least I am told. With that being said the heel ulcer occurred when she was in the hospital in December for hip surgery when she fractured her hip. She also was in the hospital Tuesday for altered mental status. Really there was nothing that was identified as the cause for this. She did have a.  Fortunately there does not appear to be any signs of infection she tells me that she has had she believes arterial studies At [facility] clinic I could not find Those studies at this point. Nonetheless I will continue to look and see what I can find before Then. The patient also has a skin tear on her arm this occurred more recently when she bumped this at home. The patient does have a history of hypertension, coronary artery disease, and peripheral vascular disease stated. 10/28/2020 I was able to find the patient's chart currently which shows that she did have an arterial study performed in May 2019. This showed that she had a abnormal right toe brachial index and a normal left toe brachial index. She was noncompressible as far as ABIs were concerned. She did appear to have triphasic flow at that time. Unfortunately the wound that is commented on in the report that I printed off and read mentions the same wound on the ankle that we are still dealing with at this point. Unfortunately this has not healed. And its been quite sometime about 3 years now. Fortunately there does not appear to be any signs of active infection systemically at this point. I think that the patient has done well with the Santyl over the past week which is good news. Patient's caregiver which is her daughter-in-law is concerned about the fact that she really does not feel qualified to be able to change the dressings and take care of this issue. There does not appear to be any signs of anything untoward going on at this point. I think she is done a great job applying the [employer] and I think that has done a great job for the patient is for soften up some of the necrotic tissue. With that being said I think the Xeroform on the arm also has done excellent. In  general I am very pleased with where we stand. And I told the patient's daughter-in- law as well that I also feel like she has done a great job over the past week taking care of her  mother. 11/11/2020 upon evaluation today patient appears to be doing well with regard to her wounds. She has been tolerating the dressing changes without complication her daughters been applying the Santyl which has done a great job. Ho with that being said I think now that we have good arterial study showing we can go ahead and proceed with sharp debridement at this point. 11/25/2020 upon evaluation today patient appears to be doing well with regard to her wounds. The Santyl has really helped to clean things up and overall she is doing quite excellent at this point. Fortunately there does not appear to be any signs of infection which is great news and in general extremely pleased. I do believe some debridement is in order and hopefully will be able to get her into a collagen dressing after this. 12/23/2020 upon evaluation today patient appears to be doing well with regard to her wound all things considered. Unfortunately it does sound like her daughter decided to let this air out because she felt like it was getting so wet. It sounds like she got border gauze dressings instead of border foam therefore there is really Nothing to catch the excess drainage which I think is the problem they were worried about the smell. Fortunately there does not appear to be any signs of active infection which is great news. Nonetheless I do not think we want to leave this just open to air. 01/13/2021 upon evaluation today patient's wounds actually appear to be about as good as have seen since have been taking care of her. Fortunately there does not appear to be any evidence of infection which is great and overall the biggest issue I see is that of fluid buildup. I think we need to do something to try to help manage this. I think if we can control her edema we get the wounds to heal more effectively. 01/27/2021 upon evaluation today patient appears to be doing well in regard to the wounds on her right lateral malleolus as well  as the left heel and the right ischial tuberosity is unfortunately a new area that has arisen since we last saw her. She tells me currently that that is from sitting too much. With that being said this actually appears to be doing the worst of anything so far that I see today. Fortunately there does not appear to be any signs of infection this is at least good news. Compression wrap seem to be doing excellent for her as far as the legs are concerned. 02/03/2021 upon evaluation today patient appears to be doing well with regard to all of her wounds. She has been tolerating the dressing changes without complication. Fortunately there is no signs of active infection at this time. No fevers, chills, nausea, vomiting, or diarrhea. 02/10/2021 upon evaluation today patient appears to be doing well with regard to her wounds. With that being said there is some slight evidence of hypergranulation in regard to the right ankle in particular and a little bit in regard to the left heel. I think Hydrofera Blue might be a better option to go to at this point based on what I am seeing especially since both of these wounds in particular seem to be a little bit stalled. I discussed that with the patient and  her daughter today. With regard to the hip this is doing great with the collagen I think will get very close to complete closure I recommend we continue with the collagen. 02/17/2021 upon evaluation today patient appears to be doing excellent in regard to her heel ulcers. She has been tolerating the dressing changes without complication and overall I am extremely pleased with where things stand today. There does not appear to be any signs of active infection which is great news. Overall I may think that we are headed in the proper direction based on what I am seeing. 02/24/2021 upon evaluation today patient's wounds actually are showing signs of improvement in regard to her heels both are doing awesome. In regard to her  hip location this is actually reopened after being closed last week and to be perfectly honest I am more concerned here about the fact that pressures causing this issue I do not think it has anything to do with her not having the collagen in place again it was completely healed last week there was no reason for the collagen to be there. 03/03/2021 upon evaluation today patient appears to be doing well with regard to her wounds. Everything is showing signs of improvement and [name], [name]. ([id number]) doing some much better as far as the overall size of the wound. Fortunately I think we are headed in the right direction. 03/10/2021 upon evaluation today patient appears to be doing well with regard to her wound. She is tolerating the dressing changes without complication. The left heel pretty much appears to be almost completely healed although I think it is probably can be 1 more week before I can call this 100% slough. The right ankle is significantly improved with that being said its not completely closed. With regard to the right hip this is completely closed. Overall I am very pleased with where things stand I think were getting very close to complete resolution. I discussed all this with the patient and her daughter-in-law today. 03/17/2021 upon evaluation today patient appears to be doing well with regard to her wounds. Fortunately there does not appear to be any signs of active infection which is great news and overall very pleased with where the patient stands today. I am in general thankful that overall she is making great progress here. There is good to be a little bit of debridement here to clear away some of the necrotic debris today on both wound locations. 03/24/2021 upon evaluation today patient appears to be doing excellent in regard to her wound. She has been tolerating the dressing changes without complication. Fortunately there does not appear to be any evidence of infection the left  foot is completely healed this is great news. On the right at the ankle region this is still open though I do not think the alginate did quite as well as I was hoping as far as getting this dried out. I think that we may just want to switch back to the [facility] which has done well up to this point. I was just hopeful this would wrap things up completely for [place]. 03/31/2021 upon evaluation today patient appears to be doing well with regard to her wound. This is on the right side which appears to be doing better than last week I think is definitely measuring smaller and this is great news. Overall I am very pleased with where we stand today. I do believe the [facility] is doing better for her. 04/14/2021 upon evaluation today  patient appears to be doing well with regard to her wound. I am very pleased with the way it stands I think that she is headed in the right direction. Unfortunately she is having issues with the Tubigrip neither she nor her daughter-in-law who is the primary caregiver can get this on. Subsequently that means that they have been having a very difficult time with complying with the necessary things for this left leg. With that being said I am really not sure what to do to help in that regard. We can always try a bigger that is larger size Tubigrip but at the same time that means that she may not get as good compression and it may not keep things under control the only way to know is to try. 04/21/2021 upon evaluation today patient appears to be doing well in regard to her wound. She has been tolerating the dressing changes without complication and in fact the right ankle ulcer is actually showing signs of excellent improvement I am very pleased with where things stand today. No fevers, chills, nausea, vomiting, or diarrhea. Electronic Signature(s) Signed: 04/21/2021 3:53:54 PM By: Worthy Keeler PA-C Entered By: Worthy Keeler on 04/21/2021 15:53:53 [name], [name]  ([id number]) -------------------------------------------------------------------------------- Physical Exam Details Patient Name: [name], [name] Date of Service: 04/21/2021 3:30 PM Medical Record Number: [id number] Patient Account Number: [id number] Date of Birth/Sex: Jan 26, 1928 (87 y.o. F) Treating RN: Dolan Amen Primary Care Provider: Viviana Simpler Other Clinician: Referring Provider: Viviana Simpler Treating Provider/Extender: Skipper Cliche in Treatment: 25 Constitutional Well-nourished and well-hydrated in no acute distress. Respiratory normal breathing without difficulty. Psychiatric this patient is able to make decisions and demonstrates good insight into disease process. Alert and Oriented x 3. pleasant and cooperative. Notes Upon inspection patient's wound bed showed signs of good granulation and epithelization at this point. Fortunately there does not appear to be any evidence of active infection which is great news and overall I am extremely pleased with where we stand at this time. No fevers, chills, nausea, vomiting, or diarrhea. Electronic Signature(s) Signed: 04/21/2021 3:54:38 PM By: Worthy Keeler PA-C Entered By: Worthy Keeler on 04/21/2021 15:54:38 Valliant, [name] ([id number]) -------------------------------------------------------------------------------- Physician Orders Details Patient Name: [name], [name] Date of Service: 04/21/2021 3:30 PM Medical Record Number: [id number] Patient Account Number: [id number] Date of Birth/Sex: 1928/04/23 (87 y.o. F) Treating RN: Dolan Amen Primary Care Provider: Viviana Simpler Other Clinician: Referring Provider: Viviana Simpler Treating Provider/Extender: Skipper Cliche in Treatment: 24 Verbal / Phone Orders: No Diagnosis Coding ICD-10 Coding Code Description L89.513 Pressure ulcer of right ankle, stage 3 I10 Essential (primary) hypertension I25.10 Atherosclerotic heart disease of native coronary  artery without angina pectoris I73.89 Other specified peripheral vascular diseases Follow-up Appointments o Return Appointment in 1 week. o Nurse Visit as needed Bathing/ Shower/ Hygiene o May shower with wound dressing protected with water repellent cover or cast protector. o No tub bath. Edema Control - Lymphedema / Segmental Compressive Device / Other Bilateral Lower Extremities o Optional: One layer of unna paste to top of compression wrap (to act as an anchor). o Tubigrip single layer applied. - Left leg-tubi grip D, cut about 50 cm long o Elevate legs to the level of the heart and pump ankles as often as possible o Elevate leg(s) parallel to the floor when sitting. o DO YOUR BEST to sleep in the bed at night. DO NOT sleep in your recliner. Long hours of sitting in a recliner leads to swelling  of the legs and/or potential wounds on your backside. Wound Treatment Wound #1 - Malleolus Wound Laterality: Right, Lateral Cleanser: Soap and Water 1 x Per Week/30 Days Discharge Instructions: Gently cleanse wound with antibacterial soap, rinse and pat dry prior to dressing wounds Primary Dressing: Hydrofera Blue Ready Transfer Foam, 2.5x2.5 (in/in) 1 x Per Week/30 Days Discharge Instructions: Apply Hydrofera Blue Ready to wound bed as directed Secondary Dressing: Gauze 1 x Per Week/30 Days Discharge Instructions: As directed: dry, moistened with saline or moistened with Dakins Solution Compression Wrap: Profore Lite LF 3 Multilayer Compression Bandaging System 1 x Per Week/30 Days Discharge Instructions: Apply 3 multi-layer wrap as prescribed. Notes Print out of "Tubigrip D" information given to patient Electronic Signature(s) Signed: 04/21/2021 4:04:33 PM By: Dolan Amen RN Signed: 04/21/2021 4:11:02 PM By: Worthy Keeler PA-C Entered By: Dolan Amen on 04/21/2021 15:50:37 Baria, [name]  ([id number]) -------------------------------------------------------------------------------- Problem List Details Patient Name: [name], [name] Date of Service: 04/21/2021 3:30 PM Medical Record Number: [id number] Patient Account Number: [id number] Date of Birth/Sex: 02/22/1928 (87 y.o. F) Treating RN: Dolan Amen Primary Care Provider: Viviana Simpler Other Clinician: Referring Provider: Viviana Simpler Treating Provider/Extender: Skipper Cliche in Treatment: 26 Active Problems ICD-10 Encounter Code Description Active Date MDM Diagnosis L89.513 Pressure ulcer of right ankle, stage 3 10/21/2020 No Yes I10 Essential (primary) hypertension 10/21/2020 No Yes I25.10 Atherosclerotic heart disease of native coronary artery without angina 10/21/2020 No Yes pectoris I73.89 Other specified peripheral vascular diseases 10/21/2020 No Yes Inactive Problems Resolved Problems ICD-10 Code Description Active Date Resolved Date L89.623 Pressure ulcer of left heel, stage 3 10/21/2020 10/21/2020 S51.802A Unspecified open wound of left forearm, initial encounter 10/21/2020 10/21/2020 L89.312 Pressure ulcer of right buttock, stage 2 01/27/2021 01/27/2021 Electronic Signature(s) Signed: 04/21/2021 3:08:42 PM By: Worthy Keeler PA-C Previous Signature: 04/21/2021 3:07:31 PM Version By: Worthy Keeler PA-C Entered By: Worthy Keeler on 04/21/2021 15:08:42 [name] ([id number]) -------------------------------------------------------------------------------- Progress Note Details Patient Name: [name] Date of Service: 04/21/2021 3:30 PM Medical Record Number: [id number] Patient Account Number: [id number] Date of Birth/Sex: [birth year] (87 y.o. F) Treating RN: Dolan Amen Primary Care Provider: Viviana Simpler Other Clinician: Referring Provider: Viviana Simpler Treating Provider/Extender: Skipper Cliche in Treatment: 26 Subjective Chief Complaint Information obtained from  Patient Pressure ulcer right ankle History of Present Illness (HPI) 10/21/2020 upon evaluation today patient presents for initial inspection here in our clinic concerning issues that she has been having quite some time in regard to her ankle and since she has been in the hospital with regard to the heel. The ankle in fact has been 5-6 years at least I am told. With that being said the heel ulcer occurred when she was in the hospital in December for hip surgery when she fractured her hip. She also was in the hospital Tuesday for altered mental status. Really there was nothing that was identified as the cause for this. She did have a. Fortunately there does not appear to be any signs of infection she tells me that she has had she believes arterial studies At [facility] clinic I could not find Those studies at this point. Nonetheless I will continue to look and see what I can find before Then. The patient also has a skin tear on her arm this occurred more recently when she bumped this at home. The patient does have a history of hypertension, coronary artery disease, and peripheral vascular disease stated. 10/28/2020 I was able to find the  patient's chart currently which shows that she did have an arterial study performed in May 2019. This showed that she had a abnormal right toe brachial index and a normal left toe brachial index. She was noncompressible as far as ABIs were concerned. She did appear to have triphasic flow at that time. Unfortunately the wound that is commented on in the report that I printed off and read mentions the same wound on the ankle that we are still dealing with at this point. Unfortunately this has not healed. And its been quite sometime about 3 years now. Fortunately there does not appear to be any signs of active infection systemically at this point. I think that the patient has done well with the Santyl over the past week which is good news. Patient's caregiver which is her  daughter-in-law is concerned about the fact that she really does not feel qualified to be able to change the dressings and take care of this issue. There does not appear to be any signs of anything untoward going on at this point. I think she is done a great job applying the [employer] and I think that has done a great job for the patient is for soften up some of the necrotic tissue. With that being said I think the Xeroform on the arm also has done excellent. In general I am very pleased with where we stand. And I told the patient's daughter-in- law as well that I also feel like she has done a great job over the past week taking care of her mother. 11/11/2020 upon evaluation today patient appears to be doing well with regard to her wounds. She has been tolerating the dressing changes without complication her daughters been applying the Santyl which has done a great job. Ho with that being said I think now that we have good arterial study showing we can go ahead and proceed with sharp debridement at this point. 11/25/2020 upon evaluation today patient appears to be doing well with regard to her wounds. The Santyl has really helped to clean things up and overall she is doing quite excellent at this point. Fortunately there does not appear to be any signs of infection which is great news and in general extremely pleased. I do believe some debridement is in order and hopefully will be able to get her into a collagen dressing after this. 12/23/2020 upon evaluation today patient appears to be doing well with regard to her wound all things considered. Unfortunately it does sound like her daughter decided to let this air out because she felt like it was getting so wet. It sounds like she got border gauze dressings instead of border foam therefore there is really Nothing to catch the excess drainage which I think is the problem they were worried about the smell. Fortunately there does not appear to be any signs of  active infection which is great news. Nonetheless I do not think we want to leave this just open to air. 01/13/2021 upon evaluation today patient's wounds actually appear to be about as good as have seen since have been taking care of her. Fortunately there does not appear to be any evidence of infection which is great and overall the biggest issue I see is that of fluid buildup. I think we need to do something to try to help manage this. I think if we can control her edema we get the wounds to heal more effectively. 01/27/2021 upon evaluation today patient appears to be doing well  in regard to the wounds on her right lateral malleolus as well as the left heel and the right ischial tuberosity is unfortunately a new area that has arisen since we last saw her. She tells me currently that that is from sitting too much. With that being said this actually appears to be doing the worst of anything so far that I see today. Fortunately there does not appear to be any signs of infection this is at least good news. Compression wrap seem to be doing excellent for her as far as the legs are concerned. 02/03/2021 upon evaluation today patient appears to be doing well with regard to all of her wounds. She has been tolerating the dressing changes without complication. Fortunately there is no signs of active infection at this time. No fevers, chills, nausea, vomiting, or diarrhea. 02/10/2021 upon evaluation today patient appears to be doing well with regard to her wounds. With that being said there is some slight evidence of hypergranulation in regard to the right ankle in particular and a little bit in regard to the left heel. I think Hydrofera Blue might be a better option to go to at this point based on what I am seeing especially since both of these wounds in particular seem to be a little bit stalled. I discussed that with the patient and her daughter today. With regard to the hip this is doing great with the collagen I  think will get very close to complete closure I recommend we continue with the collagen. 02/17/2021 upon evaluation today patient appears to be doing excellent in regard to her heel ulcers. She has been tolerating the dressing changes without complication and overall I am extremely pleased with where things stand today. There does not appear to be any signs of active infection which is great news. Overall I may think that we are headed in the proper direction based on what I am seeing. 02/24/2021 upon evaluation today patient's wounds actually are showing signs of improvement in regard to her heels both are doing awesome. In [place], [place] ([id number]) regard to her hip location this is actually reopened after being closed last week and to be perfectly honest I am more concerned here about the fact that pressures causing this issue I do not think it has anything to do with her not having the collagen in place again it was completely healed last week there was no reason for the collagen to be there. 03/03/2021 upon evaluation today patient appears to be doing well with regard to her wounds. Everything is showing signs of improvement and doing some much better as far as the overall size of the wound. Fortunately I think we are headed in the right direction. 03/10/2021 upon evaluation today patient appears to be doing well with regard to her wound. She is tolerating the dressing changes without complication. The left heel pretty much appears to be almost completely healed although I think it is probably can be 1 more week before I can call this 100% slough. The right ankle is significantly improved with that being said its not completely closed. With regard to the right hip this is completely closed. Overall I am very pleased with where things stand I think were getting very close to complete resolution. I discussed all this with the patient and her daughter-in-law today. 03/17/2021 upon evaluation today  patient appears to be doing well with regard to her wounds. Fortunately there does not appear to be any signs of active infection  which is great news and overall very pleased with where the patient stands today. I am in general thankful that overall she is making great progress here. There is good to be a little bit of debridement here to clear away some of the necrotic debris today on both wound locations. 03/24/2021 upon evaluation today patient appears to be doing excellent in regard to her wound. She has been tolerating the dressing changes without complication. Fortunately there does not appear to be any evidence of infection the left foot is completely healed this is great news. On the right at the ankle region this is still open though I do not think the alginate did quite as well as I was hoping as far as getting this dried out. I think that we may just want to switch back to the [employer] which has done well up to this point. I was just hopeful this would wrap things up completely for [place]. 03/31/2021 upon evaluation today patient appears to be doing well with regard to her wound. This is on the right side which appears to be doing better than last week I think is definitely measuring smaller and this is great news. Overall I am very pleased with where we stand today. I do believe the [facility] is doing better for her. 04/14/2021 upon evaluation today patient appears to be doing well with regard to her wound. I am very pleased with the way it stands I think that she is headed in the right direction. Unfortunately she is having issues with the Tubigrip neither she nor her daughter-in-law who is the primary caregiver can get this on. Subsequently that means that they have been having a very difficult time with complying with the necessary things for this left leg. With that being said I am really not sure what to do to help in that regard. We can always try a bigger that is larger size  Tubigrip but at the same time that means that she may not get as good compression and it may not keep things under control the only way to know is to try. 04/21/2021 upon evaluation today patient appears to be doing well in regard to her wound. She has been tolerating the dressing changes without complication and in fact the right ankle ulcer is actually showing signs of excellent improvement I am very pleased with where things stand today. No fevers, chills, nausea, vomiting, or diarrhea. Objective Constitutional Well-nourished and well-hydrated in no acute distress. Vitals Time Taken: 3:12 PM, Height: 66 in, Weight: 76 lbs, BMI: 12.3, Temperature: 97.7 F, Pulse: 66 bpm, Respiratory Rate: 18 breaths/min, Blood Pressure: 173/82 mmHg. Respiratory normal breathing without difficulty. Psychiatric this patient is able to make decisions and demonstrates good insight into disease process. Alert and Oriented x 3. pleasant and cooperative. General Notes: Upon inspection patient's wound bed showed signs of good granulation and epithelization at this point. Fortunately there does not appear to be any evidence of active infection which is great news and overall I am extremely pleased with where we stand at this time. No fevers, chills, nausea, vomiting, or diarrhea. Integumentary (Hair, Skin) Wound #1 status is Open. Original cause of wound was Gradually Appeared. The date acquired was: 10/22/2014. The wound has been in treatment 26 weeks. The wound is located on the Right,Lateral Malleolus. The wound measures 0.4cm length x 0.4cm width x 0.1cm depth; 0.126cm^2 area and 0.013cm^3 volume. There is Fat Layer (Subcutaneous Tissue) exposed. There is no tunneling or undermining noted.  There is a medium amount of serous drainage noted. The wound margin is flat and intact. There is large (67-100%) red, pink granulation within the wound bed. There is no necrotic tissue within the wound bed. [name], [name]  ([id number]) Assessment Active Problems ICD-10 Pressure ulcer of right ankle, stage 3 Essential (primary) hypertension Atherosclerotic heart disease of native coronary artery without angina pectoris Other specified peripheral vascular diseases Procedures Wound #1 Pre-procedure diagnosis of Wound #1 is a Pressure Ulcer located on the Right,Lateral Malleolus . There was a Three Layer Compression Therapy Procedure with a pre-treatment ABI of 1.6 by Dolan Amen, RN. Post procedure Diagnosis Wound #1: Same as Pre-Procedure Plan Follow-up Appointments: Return Appointment in 1 week. Nurse Visit as needed Bathing/ Shower/ Hygiene: May shower with wound dressing protected with water repellent cover or cast protector. No tub bath. Edema Control - Lymphedema / Segmental Compressive Device / Other: Optional: One layer of unna paste to top of compression wrap (to act as an anchor). Tubigrip single layer applied. - Left leg-tubi grip D, cut about 50 cm long Elevate legs to the level of the heart and pump ankles as often as possible Elevate leg(s) parallel to the floor when sitting. DO YOUR BEST to sleep in the bed at night. DO NOT sleep in your recliner. Long hours of sitting in a recliner leads to swelling of the legs and/or potential wounds on your backside. General Notes: Print out of "Tubigrip D" information given to patient WOUND #1: - Malleolus Wound Laterality: Right, Lateral Cleanser: Soap and Water 1 x Per Week/30 Days Discharge Instructions: Gently cleanse wound with antibacterial soap, rinse and pat dry prior to dressing wounds Primary Dressing: Hydrofera Blue Ready Transfer Foam, 2.5x2.5 (in/in) 1 x Per Week/30 Days Discharge Instructions: Apply Hydrofera Blue Ready to wound bed as directed Secondary Dressing: Gauze 1 x Per Week/30 Days Discharge Instructions: As directed: dry, moistened with saline or moistened with Dakins Solution Compression Wrap: Profore Lite LF 3 Multilayer  Compression Bandaging System 1 x Per Week/30 Days Discharge Instructions: Apply 3 multi-layer wrap as prescribed. 1. Would recommend currently based on what I am seeing that we actually going to continue with the Tubigrip size D for the left leg this seems to be doing much better. 2. I am also can recommend that we have the patient continue with the 3 layer compression wrap for the right leg which is doing a great job. 3. I am also can recommend that she continue with the [facility] which I think is doing a great job. We will see patient back for reevaluation in 1 week here in the clinic. If anything worsens or changes patient will contact our office for additional recommendations. Electronic Signature(s) Signed: 04/21/2021 3:57:53 PM By: Worthy Keeler PA-C Entered By: Worthy Keeler on 04/21/2021 15:57:53 Trippett, [name] ([id number]) -------------------------------------------------------------------------------- SuperBill Details Patient Name: [name], [name] Date of Service: 04/21/2021 Medical Record Number: [id number] Patient Account Number: [id number] Date of Birth/Sex: Feb 06, 1928 (87 y.o. F) Treating RN: Dolan Amen Primary Care Provider: Viviana Simpler Other Clinician: Referring Provider: Viviana Simpler Treating Provider/Extender: Skipper Cliche in Treatment: 26 Diagnosis Coding ICD-10 Codes Code Description [phone] Pressure ulcer of right ankle, stage 3 I10 Essential (primary) hypertension I25.10 Atherosclerotic heart disease of native coronary artery without angina pectoris I73.89 Other specified peripheral vascular diseases Facility Procedures CPT4 Code: [id number] Description: (Facility Use Only) [phone] - [place] LWR RT LEG Modifier: Quantity: 1 Physician Procedures CPT4 Code: 6283662 Description: 94765 -  WC PHYS LEVEL 4 - EST PT Modifier: Quantity: 1 CPT4 Code: Description: ICD-10 Diagnosis Description L89.513 Pressure ulcer of right  ankle, stage 3 I10 Essential (primary) hypertension I25.10 Atherosclerotic heart disease of native coronary artery without angina I73.89 Other specified peripheral vascular  diseases Modifier: pectoris Quantity: Electronic Signature(s) Signed: 04/21/2021 4:00:49 PM By: Dolan Amen RN Signed: 04/21/2021 4:11:02 PM By: Worthy Keeler PA-C Previous Signature: 04/21/2021 3:58:16 PM Version By: Worthy Keeler PA-C Entered By: Dolan Amen on 04/21/2021 16:00:49

## 2021-04-21 NOTE — Progress Notes (Signed)
YULIET, NEEDS ([id number]) Visit Report for 04/21/2021 Arrival Information Details Patient Name: [name], [name] Date of Service: 04/21/2021 3:30 PM Medical Record Number: [id number] Patient Account Number: [id number] Date of Birth/Sex: Jun 22, 1928 (87 y.o. F) Treating RN: Dolan Amen Primary Care Valeriano Bain: Viviana Simpler Other Clinician: Referring Ivoree Felmlee: Viviana Simpler Treating Jazzy Parmer/Extender: Skipper Cliche in Treatment: 26 Visit Information History Since Last Visit Pain Present Now: No Patient Arrived: Walker Arrival Time: 15:05 Accompanied By: daughter in Agricultural engineer Assistance: None Patient Identification Verified: Yes Secondary Verification Process Completed: Yes Patient Requires Transmission-Based Precautions: No Patient Has Alerts: Yes Patient Alerts: NOT DIABETIC TBI left .77 TBI right .76 Electronic Signature(s) Signed: 04/21/2021 4:04:33 PM By: Dolan Amen RN Entered By: Dolan Amen on 04/21/2021 15:06:09 [name] ([id number]) -------------------------------------------------------------------------------- Clinic Level of Care Assessment Details Patient Name: [name], [name] Date of Service: 04/21/2021 3:30 PM Medical Record Number: [id number] Patient Account Number: [id number] Date of Birth/Sex: 04-03-1928 (87 y.o. F) Treating RN: Dolan Amen Primary Care Shasta Chinn: Viviana Simpler Other Clinician: Referring Alonnah Lampkins: Viviana Simpler Treating Sharon Rubis/Extender: Skipper Cliche in Treatment: 26 Clinic Level of Care Assessment Items TOOL 1 Quantity Score []  - Use when EandM and Procedure is performed on INITIAL visit 0 ASSESSMENTS - Nursing Assessment / Reassessment []  - General Physical Exam (combine w/ comprehensive assessment (listed just below) when performed on new 0 pt. evals) []  - 0 Comprehensive Assessment (HX, ROS, Risk Assessments, Wounds Hx, etc.) ASSESSMENTS - Wound and Skin Assessment / Reassessment []  -  Dermatologic / Skin Assessment (not related to wound area) 0 ASSESSMENTS - Ostomy and/or Continence Assessment and Care []  - Incontinence Assessment and Management 0 []  - 0 Ostomy Care Assessment and Management (repouching, etc.) PROCESS - Coordination of Care []  - Simple Patient / Family Education for ongoing care 0 []  - 0 Complex (extensive) Patient / Family Education for ongoing care []  - 0 Staff obtains Programmer, systems, Records, Test Results / Process Orders []  - 0 Staff telephones HHA, Nursing Homes / Clarify orders / etc []  - 0 Routine Transfer to another Facility (non-emergent condition) []  - 0 Routine Hospital Admission (non-emergent condition) []  - 0 New Admissions / Biomedical engineer / Ordering NPWT, Apligraf, etc. []  - 0 Emergency Hospital Admission (emergent condition) PROCESS - Special Needs []  - Pediatric / Minor Patient Management 0 []  - 0 Isolation Patient Management []  - 0 Hearing / Language / Visual special needs []  - 0 Assessment of Community assistance (transportation, D/C planning, etc.) []  - 0 Additional assistance / Altered mentation []  - 0 Support Surface(s) Assessment (bed, cushion, seat, etc.) INTERVENTIONS - Miscellaneous []  - External ear exam 0 []  - 0 Patient Transfer (multiple staff / Civil Service fast streamer / Similar devices) []  - 0 Simple Staple / Suture removal (25 or less) []  - 0 Complex Staple / Suture removal (26 or more) []  - 0 Hypo/Hyperglycemic Management (do not check if billed separately) []  - 0 Ankle / Brachial Index (ABI) - do not check if billed separately Has the patient been seen at the hospital within the last three years: Yes Total Score: 0 Level Of Care: ____ [name] ([id number]) Electronic Signature(s) Signed: 04/21/2021 4:04:33 PM By: Dolan Amen RN Entered By: Dolan Amen on 04/21/2021 16:00:59 Denman, [name]  ([id number]) -------------------------------------------------------------------------------- Compression Therapy Details Patient Name: [name], [name] Date of Service: 04/21/2021 3:30 PM Medical Record Number: [id number] Patient Account Number: [id number] Date of Birth/Sex: 02-01-1928 (87 y.o. F) Treating RN: Dolan Amen Primary Care Jeri Rawlins: Viviana Simpler  Other Clinician: Referring Kambrey Hagger: Viviana Simpler Treating Heaven Meeker/Extender: Skipper Cliche in Treatment: 26 Compression Therapy Performed for Wound Assessment: Wound #1 Right,Lateral Malleolus Performed By: Cora Daniels, RN Compression Type: Three Layer Pre Treatment ABI: 1.6 Post Procedure Diagnosis Same as Pre-procedure Electronic Signature(s) Signed: 04/21/2021 4:04:33 PM By: Dolan Amen RN Entered By: Dolan Amen on 04/21/2021 15:32:49 Swanner, [name] ([id number]) -------------------------------------------------------------------------------- Encounter Discharge Information Details Patient Name: [name], [name] Date of Service: 04/21/2021 3:30 PM Medical Record Number: [id number] Patient Account Number: [id number] Date of Birth/Sex: [birth year] (87 y.o. F) Treating RN: Dolan Amen Primary Care Youssef Footman: Viviana Simpler Other Clinician: Referring Dossie Swor: Viviana Simpler Treating Alen Matheson/Extender: Skipper Cliche in Treatment: 26 Encounter Discharge Information Items Discharge Condition: Stable Ambulatory Status: Walker Discharge Destination: Home Transportation: Private Auto Accompanied By: daughter in law Schedule Follow-up Appointment: Yes Clinical Summary of Care: Electronic Signature(s) Signed: 04/21/2021 4:01:57 PM By: Dolan Amen RN Entered By: Dolan Amen on 04/21/2021 16:01:57 Baetz, [name] ([id number]) -------------------------------------------------------------------------------- Lower Extremity Assessment Details Patient Name: [name], [name] Date of  Service: 04/21/2021 3:30 PM Medical Record Number: [id number] Patient Account Number: [id number] Date of Birth/Sex: 1927-08-16 (87 y.o. F) Treating RN: Dolan Amen Primary Care Abdulrahman Bracey: Viviana Simpler Other Clinician: Referring Nora Sabey: Viviana Simpler Treating Nyaisha Simao/Extender: Skipper Cliche in Treatment: 26 Edema Assessment Assessed: [Left: No] Patrice Paradise: Yes] Edema: [Left: Ye] [Right: s] Calf Left: Right: Point of Measurement: 33 cm From Medial Instep 21 cm Ankle Left: Right: Point of Measurement: 9 cm From Medial Instep 17.5 cm Vascular Assessment Pulses: Dorsalis Pedis Palpable: [Right:Yes] Electronic Signature(s) Signed: 04/21/2021 4:04:33 PM By: Dolan Amen RN Entered By: Dolan Amen on 04/21/2021 15:18:14 Wiest, [name] ([id number]) -------------------------------------------------------------------------------- Multi Wound Chart Details Patient Name: [name] Date of Service: 04/21/2021 3:30 PM Medical Record Number: [id number] Patient Account Number: [id number] Date of Birth/Sex: 1928/01/13 (87 y.o. F) Treating RN: Dolan Amen Primary Care Roel Douthat: Viviana Simpler Other Clinician: Referring Deondrick Searls: Viviana Simpler Treating Talaysha Freeberg/Extender: Skipper Cliche in Treatment: 26 Vital Signs Height(in): 66 Pulse(bpm): 66 Weight(lbs): 76 Blood Pressure(mmHg): 173/82 Body Mass Index(BMI): 12 Temperature(F): 97.7 Respiratory Rate(breaths/min): 18 Photos: [N/A:N/A] Wound Location: Right, Lateral Malleolus N/A N/A Wounding Event: Gradually Appeared N/A N/A Primary Etiology: Pressure Ulcer N/A N/A Comorbid History: Arrhythmia, Coronary Artery N/A N/A Disease, Hypertension, Peripheral Arterial Disease, History of pressure wounds, Osteoarthritis Date Acquired: 10/22/2014 N/A N/A Weeks of Treatment: 26 N/A N/A Wound Status: Open N/A N/A Measurements L x W x D (cm) 0.4x0.4x0.1 N/A N/A Area (cm) : 0.126 N/A N/A Volume (cm) : 0.013 N/A  N/A % Reduction in Area: 83.90% N/A N/A % Reduction in Volume: 83.50% N/A N/A Classification: Category/Stage III N/A N/A Exudate Amount: Medium N/A N/A Exudate Type: Serous N/A N/A Exudate Color: amber N/A N/A Wound Margin: Flat and Intact N/A N/A Granulation Amount: Large (67-100%) N/A N/A Granulation Quality: Red, Pink N/A N/A Necrotic Amount: None Present (0%) N/A N/A Exposed Structures: Fat Layer (Subcutaneous Tissue): N/A N/A Yes Fascia: No Tendon: No Muscle: No Joint: No Bone: No Epithelialization: Small (1-33%) N/A N/A Treatment Notes Electronic Signature(s) Signed: 04/21/2021 4:04:33 PM By: Dolan Amen RN Entered By: Dolan Amen on 04/21/2021 15:31:48 Schamp, [name] ([id number]) -------------------------------------------------------------------------------- [place] Details Patient Name: [name], [name] Date of Service: 04/21/2021 3:30 PM Medical Record Number: [id number] Patient Account Number: [id number] Date of Birth/Sex: July 17, 1927 (87 y.o. F) Treating RN: Dolan Amen Primary Care Fitzpatrick Alberico: Viviana Simpler Other Clinician: Referring Darien Kading: Viviana Simpler Treating Dontee Jaso/Extender: Skipper Cliche in  Treatment: 26 Active Inactive Electronic Signature(s) Signed: 04/21/2021 4:04:33 PM By: Dolan Amen RN Entered By: Dolan Amen on 04/21/2021 15:31:38 Tseng, [name] ([id number]) -------------------------------------------------------------------------------- Pain Assessment Details Patient Name: [name], [name] Date of Service: 04/21/2021 3:30 PM Medical Record Number: [id number] Patient Account Number: [id number] Date of Birth/Sex: 02-20-1928 (87 y.o. F) Treating RN: Dolan Amen Primary Care Shakyia Bosso: Viviana Simpler Other Clinician: Referring Shalandria Elsbernd: Viviana Simpler Treating Viyaan Champine/Extender: Skipper Cliche in Treatment: 26 Active Problems Location of Pain Severity and Description of Pain Patient Has  Paino No Site Locations Pain Management and Medication Current Pain Management: Electronic Signature(s) Signed: 04/21/2021 4:04:33 PM By: Dolan Amen RN Entered By: Dolan Amen on 04/21/2021 15:12:16 Espy, [name] ([id number]) -------------------------------------------------------------------------------- Patient/Caregiver Education Details Patient Name: [name], [name] Date of Service: 04/21/2021 3:30 PM Medical Record Number: [id number] Patient Account Number: [id number] Date of Birth/Gender: [birth year] (87 y.o. F) Treating RN: Dolan Amen Primary Care Physician: Viviana Simpler Other Clinician: Referring Physician: Viviana Simpler Treating Physician/Extender: Skipper Cliche in Treatment: 8 Education Assessment Education Provided To: Patient and Caregiver daughter in law Education Topics Provided Wound/Skin Impairment: Methods: Explain/Verbal Responses: State content correctly [employer]) Signed: 04/21/2021 4:04:33 PM By: Dolan Amen RN Entered By: Dolan Amen on 04/21/2021 16:01:23 Philipp, [name] ([id number]) -------------------------------------------------------------------------------- Wound Assessment Details Patient Name: [name], [name] Date of Service: 04/21/2021 3:30 PM Medical Record Number: [id number] Patient Account Number: [id number] Date of Birth/Sex: February 15, 1928 (87 y.o. F) Treating RN: Dolan Amen Primary Care Ellah Otte: Viviana Simpler Other Clinician: Referring John Williamsen: Viviana Simpler Treating Yavuz Kirby/Extender: Skipper Cliche in Treatment: 26 Wound Status Wound Number: 1 Primary Pressure Ulcer Etiology: Wound Location: Right, Lateral Malleolus Wound Open Wounding Event: Gradually Appeared Status: Date Acquired: 10/22/2014 Comorbid Arrhythmia, Coronary Artery Disease, Hypertension, Weeks Of Treatment: 26 History: Peripheral Arterial Disease, History of pressure wounds, Clustered Wound:  No Osteoarthritis Photos Wound Measurements Length: (cm) 0.4 Width: (cm) 0.4 Depth: (cm) 0.1 Area: (cm) 0.126 Volume: (cm) 0.013 % Reduction in Area: 83.9% % Reduction in Volume: 83.5% Epithelialization: Small (1-33%) Tunneling: No Undermining: No Wound Description Classification: Category/Stage III Wound Margin: Flat and Intact Exudate Amount: Medium Exudate Type: Serous Exudate Color: amber Foul Odor After Cleansing: No Slough/Fibrino No Wound Bed Granulation Amount: Large (67-100%) Exposed Structure Granulation Quality: Red, Pink Fascia Exposed: No Necrotic Amount: None Present (0%) Fat Layer (Subcutaneous Tissue) Exposed: Yes Tendon Exposed: No Muscle Exposed: No Joint Exposed: No Bone Exposed: No Treatment Notes Wound #1 (Malleolus) Wound Laterality: Right, Lateral Cleanser Soap and Water Discharge Instruction: Gently cleanse wound with antibacterial soap, rinse and pat dry prior to dressing wounds BELL, CARBO ([id number]) Peri-Wound Care Topical Primary Dressing Hydrofera Blue Ready Transfer Foam, 2.5x2.5 (in/in) Discharge Instruction: Apply Hydrofera Blue Ready to wound bed as directed Secondary Dressing Gauze Discharge Instruction: As directed: dry, moistened with saline or moistened with Dakins Solution Secured With Compression Wrap Profore Lite LF 3 Multilayer Compression Bandaging System Discharge Instruction: Apply 3 multi-layer wrap as prescribed. Compression Stockings Add-Ons Electronic Signature(s) Signed: 04/21/2021 4:04:33 PM By: Dolan Amen RN Entered By: Dolan Amen on 04/21/2021 15:17:10 Grulke, [name] ([id number]) -------------------------------------------------------------------------------- [place] Details Patient Name: [name], [name] Date of Service: 04/21/2021 3:30 PM Medical Record Number: [id number] Patient Account Number: [id number] Date of Birth/Sex: 01-11-1928 (87 y.o. F) Treating RN: Dolan Amen Primary Care  Berton Butrick: Viviana Simpler Other Clinician: Referring Vishwa Dais: Viviana Simpler Treating Christianne Zacher/Extender: Skipper Cliche in Treatment: 26 Vital Signs Time Taken: 15:12 Temperature (F): 97.7 Height (in): 66 Pulse (bpm):  66 Weight (lbs): 76 Respiratory Rate (breaths/min): 18 Body Mass Index (BMI): 12.3 Blood Pressure (mmHg): 173/82 Reference Range: 80 - 120 mg / dl Electronic Signature(s) Signed: 04/21/2021 4:04:33 PM By: Dolan Amen RN Entered By: Dolan Amen on 04/21/2021 15:13:49

## 2021-04-24 ENCOUNTER — Encounter: Payer: PPO | Admitting: Dermatology

## 2021-04-28 ENCOUNTER — Other Ambulatory Visit: Payer: Self-pay

## 2021-04-28 ENCOUNTER — Encounter: Payer: PPO | Admitting: Physician Assistant

## 2021-04-28 DIAGNOSIS — L89513 Pressure ulcer of right ankle, stage 3: Secondary | ICD-10-CM | POA: Diagnosis not present

## 2021-04-28 NOTE — Progress Notes (Addendum)
Downs, Elizabeth ([id number]) Visit Report for 04/28/2021 Arrival Information Details Patient Name: [name], [name] Date of Service: 04/28/2021 3:15 PM Medical Record Number: [id number] Patient Account Number: [id number] Date of Birth/Sex: 1927-09-05 (87 y.o. F) Treating RN: Cornell Barman Primary Care Caily Rakers: Viviana Simpler Other Clinician: Referring Marlei Glomski: Viviana Simpler Treating Jaleel Allen/Extender: Skipper Cliche in Treatment: 27 Visit Information History Since Last Visit Added or deleted any medications: No Patient Arrived: Ambulatory Has Dressing in Place as Prescribed: Yes Arrival Time: 15:24 Has Compression in Place as Prescribed: Yes Accompanied By: daughter Pain Present Now: No Transfer Assistance: None Patient Identification Verified: Yes Secondary Verification Process Completed: Yes Patient Requires Transmission-Based Precautions: No Patient Has Alerts: Yes Patient Alerts: NOT DIABETIC TBI left .77 TBI right .76 Electronic Signature(s) Signed: 05/01/2021 4:30:36 PM By: Gretta Cool, BSN, RN, CWS, Kim RN, BSN Entered By: Gretta Cool, BSN, RN, CWS, Kim on 04/28/2021 15:28:32 [name] ([id number]) -------------------------------------------------------------------------------- Clinic Level of Care Assessment Details Patient Name: Elizabeth, Downs Date of Service: 04/28/2021 3:15 PM Medical Record Number: [id number] Patient Account Number: [id number] Date of Birth/Sex: 1928-04-03 (87 y.o. F) Treating RN: Cornell Barman Primary Care Oktober Glazer: Viviana Simpler Other Clinician: Referring Jager Koska: Viviana Simpler Treating Jenille Laszlo/Extender: Skipper Cliche in Treatment: 27 Clinic Level of Care Assessment Items TOOL 4 Quantity Score []  - Use when only an EandM is performed on FOLLOW-UP visit 0 ASSESSMENTS - Nursing Assessment / Reassessment X - Reassessment of Co-morbidities (includes updates in patient status) 1 10 X- 1 5 Reassessment of Adherence to Treatment  Plan ASSESSMENTS - Wound and Skin Assessment / Reassessment X - Simple Wound Assessment / Reassessment - one wound 1 5 []  - 0 Complex Wound Assessment / Reassessment - multiple wounds []  - 0 Dermatologic / Skin Assessment (not related to wound area) ASSESSMENTS - Focused Assessment []  - Circumferential Edema Measurements - multi extremities 0 []  - 0 Nutritional Assessment / Counseling / Intervention []  - 0 Lower Extremity Assessment (monofilament, tuning fork, pulses) []  - 0 Peripheral Arterial Disease Assessment (using hand held doppler) ASSESSMENTS - Ostomy and/or Continence Assessment and Care []  - Incontinence Assessment and Management 0 []  - 0 Ostomy Care Assessment and Management (repouching, etc.) PROCESS - Coordination of Care X - Simple Patient / Family Education for ongoing care 1 15 []  - 0 Complex (extensive) Patient / Family Education for ongoing care []  - 0 Staff obtains Programmer, systems, Records, Test Results / Process Orders []  - 0 Staff telephones HHA, Nursing Homes / Clarify orders / etc []  - 0 Routine Transfer to another Facility (non-emergent condition) []  - 0 Routine Hospital Admission (non-emergent condition) []  - 0 New Admissions / Biomedical engineer / Ordering NPWT, Apligraf, etc. []  - 0 Emergency Hospital Admission (emergent condition) X- 1 10 Simple Discharge Coordination []  - 0 Complex (extensive) Discharge Coordination PROCESS - Special Needs []  - Pediatric / Minor Patient Management 0 []  - 0 Isolation Patient Management []  - 0 Hearing / Language / Visual special needs []  - 0 Assessment of Community assistance (transportation, D/C planning, etc.) []  - 0 Additional assistance / Altered mentation []  - 0 Support Surface(s) Assessment (bed, cushion, seat, etc.) INTERVENTIONS - Wound Cleansing / Measurement LASHAWNDRA, LAMPKINS ([id number]) X- 1 5 Simple Wound Cleansing - one wound []  - 0 Complex Wound Cleansing - multiple wounds X- 1 5 Wound  Imaging (photographs - any number of wounds) []  - 0 Wound Tracing (instead of photographs) X- 1 5 Simple Wound Measurement - one wound []  - 0 Complex  Wound Measurement - multiple wounds INTERVENTIONS - Wound Dressings []  - Small Wound Dressing one or multiple wounds 0 X- 1 15 Medium Wound Dressing one or multiple wounds []  - 0 Large Wound Dressing one or multiple wounds []  - 0 Application of Medications - topical []  - 0 Application of Medications - injection INTERVENTIONS - Miscellaneous []  - External ear exam 0 []  - 0 Specimen Collection (cultures, biopsies, blood, body fluids, etc.) []  - 0 Specimen(s) / Culture(s) sent or taken to Lab for analysis []  - 0 Patient Transfer (multiple staff / Civil Service fast streamer / Similar devices) []  - 0 Simple Staple / Suture removal (25 or less) []  - 0 Complex Staple / Suture removal (26 or more) []  - 0 Hypo / Hyperglycemic Management (close monitor of Blood Glucose) []  - 0 Ankle / Brachial Index (ABI) - do not check if billed separately X- 1 5 Vital Signs Has the patient been seen at the hospital within the last three years: Yes Total Score: 80 Level Of Care: New/Established - Level 3 Electronic Signature(s) Signed: 05/01/2021 4:30:36 PM By: Gretta Cool, BSN, RN, CWS, Kim RN, BSN Entered By: Gretta Cool, BSN, RN, CWS, Kim on 04/28/2021 16:55:16 Rothe, [name] ([id number]) -------------------------------------------------------------------------------- Encounter Discharge Information Details Patient Name: Elizabeth, Downs Date of Service: 04/28/2021 3:15 PM Medical Record Number: [id number] Patient Account Number: [id number] Date of Birth/Sex: September 07, 1927 (87 y.o. F) Treating RN: Cornell Barman Primary Care Jakiyah Stepney: Viviana Simpler Other Clinician: Referring Keeli Roberg: Viviana Simpler Treating Reyhan Moronta/Extender: Skipper Cliche in Treatment: 27 Encounter Discharge Information Items Discharge Condition: Stable Ambulatory Status: Ambulatory Discharge  Destination: Home Transportation: Private Auto Accompanied By: daughter Schedule Follow-up Appointment: Yes Clinical Summary of Care: Electronic Signature(s) Signed: 04/28/2021 4:57:21 PM By: Gretta Cool, BSN, RN, CWS, Kim RN, BSN Entered By: Gretta Cool, BSN, RN, CWS, Kim on 04/28/2021 16:57:21 Mahaney, [name] ([id number]) -------------------------------------------------------------------------------- Lower Extremity Assessment Details Patient Name: ADESUWA, OSGOOD Date of Service: 04/28/2021 3:15 PM Medical Record Number: [id number] Patient Account Number: [id number] Date of Birth/Sex: [birth year] (87 y.o. F) Treating RN: Cornell Barman Primary Care Ami Thornsberry: Viviana Simpler Other Clinician: Referring Quintus Premo: Viviana Simpler Treating Jermani Pund/Extender: Skipper Cliche in Treatment: 27 Edema Assessment Assessed: [Left: No] [Right: No] [Left: Edema] [Right: :] Calf Left: Right: Point of Measurement: 33 cm From Medial Instep 20 cm Ankle Left: Right: Point of Measurement: 9 cm From Medial Instep 17 cm Vascular Assessment Pulses: Dorsalis Pedis Palpable: [Right:Yes] Electronic Signature(s) Signed: 05/01/2021 4:30:36 PM By: Gretta Cool, BSN, RN, CWS, Kim RN, BSN Entered By: Gretta Cool, BSN, RN, CWS, Kim on 04/28/2021 15:38:57 Faulks, [name] ([id number]) -------------------------------------------------------------------------------- Multi Wound Chart Details Patient Name: DELLANIRA, DILLOW Date of Service: 04/28/2021 3:15 PM Medical Record Number: [id number] Patient Account Number: [id number] Date of Birth/Sex: May 14, 1928 (87 y.o. F) Treating RN: Cornell Barman Primary Care Kazmir Oki: Viviana Simpler Other Clinician: Referring Azaiah Licciardi: Viviana Simpler Treating Jerimah Witucki/Extender: Skipper Cliche in Treatment: 27 Vital Signs Height(in): 66 Pulse(bpm): 73 Weight(lbs): 48 Blood Pressure(mmHg): 141/80 Body Mass Index(BMI): 12 Temperature(F): 97.7 Respiratory Rate(breaths/min): 18 Photos:  [N/A:N/A] Wound Location: Right, Lateral Malleolus N/A N/A Wounding Event: Gradually Appeared N/A N/A Primary Etiology: Pressure Ulcer N/A N/A Comorbid History: Arrhythmia, Coronary Artery N/A N/A Disease, Hypertension, Peripheral Arterial Disease, History of pressure wounds, Osteoarthritis Date Acquired: 10/22/2014 N/A N/A Weeks of Treatment: 27 N/A N/A Wound Status: Open N/A N/A Measurements L x W x D (cm) 0.2x0.3x0.1 N/A N/A Area (cm) : 0.047 N/A N/A Volume (cm) : 0.005 N/A N/A % Reduction in Area: 94.00% N/A  N/A % Reduction in Volume: 93.70% N/A N/A Classification: Category/Stage III N/A N/A Exudate Amount: Medium N/A N/A Exudate Type: Serous N/A N/A Exudate Color: amber N/A N/A Wound Margin: Flat and Intact N/A N/A Granulation Amount: Large (67-100%) N/A N/A Granulation Quality: Red, Pink N/A N/A Necrotic Amount: None Present (0%) N/A N/A Exposed Structures: Fat Layer (Subcutaneous Tissue): N/A N/A Yes Fascia: No Tendon: No Muscle: No Joint: No Bone: No Epithelialization: Small (1-33%) N/A N/A Treatment Notes Electronic Signature(s) Signed: 05/01/2021 4:30:36 PM By: Gretta Cool, BSN, RN, CWS, Kim RN, BSN Entered By: Gretta Cool, BSN, RN, CWS, Kim on 04/28/2021 15:48:08 [name] ([id number]) -------------------------------------------------------------------------------- [place] Details Patient Name: ANETTE, BARRA. Date of Service: 04/28/2021 3:15 PM Medical Record Number: [id number] Patient Account Number: [id number] Date of Birth/Sex: 08-28-1927 (87 y.o. F) Treating RN: Cornell Barman Primary Care Abdiaziz Klahn: Viviana Simpler Other Clinician: Referring Mahaley Schwering: Viviana Simpler Treating Acelin Ferdig/Extender: Skipper Cliche in Treatment: 34 Active Inactive Electronic Signature(s) Signed: 05/01/2021 4:30:36 PM By: Gretta Cool, BSN, RN, CWS, Kim RN, BSN Entered By: Gretta Cool, BSN, RN, CWS, Kim on 04/28/2021 15:39:07 [name], [name]  ([id number]) -------------------------------------------------------------------------------- Pain Assessment Details Patient Name: DENIM, START Date of Service: 04/28/2021 3:15 PM Medical Record Number: [id number] Patient Account Number: [id number] Date of Birth/Sex: 20-Oct-1927 (87 y.o. F) Treating RN: Cornell Barman Primary Care Warner Laduca: Viviana Simpler Other Clinician: Referring Ravin Denardo: Viviana Simpler Treating Chrystal Zeimet/Extender: Skipper Cliche in Treatment: 27 Active Problems Location of Pain Severity and Description of Pain Patient Has Paino No Site Locations Pain Management and Medication Current Pain Management: Electronic Signature(s) Signed: 05/01/2021 4:30:36 PM By: Gretta Cool, BSN, RN, CWS, Kim RN, BSN Entered By: Gretta Cool, BSN, RN, CWS, Kim on 04/28/2021 15:29:09 [name] ([id number]) -------------------------------------------------------------------------------- Patient/Caregiver Education Details Patient Name: MADDUX, FIRST Date of Service: 04/28/2021 3:15 PM Medical Record Number: [id number] Patient Account Number: [id number] Date of Birth/Gender: June 29, 1928 (87 y.o. F) Treating RN: Cornell Barman Primary Care Physician: Viviana Simpler Other Clinician: Referring Physician: Viviana Simpler Treating Physician/Extender: Skipper Cliche in Treatment: 38 Education Assessment Education Provided To: Patient Education Topics Provided Wound/Skin Impairment: Handouts: Caring for Your Ulcer Methods: Demonstration, Explain/Verbal Responses: State content correctly Electronic Signature(s) Signed: 05/01/2021 4:30:36 PM By: Gretta Cool, BSN, RN, CWS, Kim RN, BSN Entered By: Gretta Cool, BSN, RN, CWS, Kim on 04/28/2021 16:56:45 Petta, [name] ([id number]) -------------------------------------------------------------------------------- Wound Assessment Details Patient Name: MELODY, CIRRINCIONE Date of Service: 04/28/2021 3:15 PM Medical Record Number: [id number] Patient Account  Number: [id number] Date of Birth/Sex: [birth year] (87 y.o. F) Treating RN: Cornell Barman Primary Care Kinnick Maus: Viviana Simpler Other Clinician: Referring Lakota Markgraf: Viviana Simpler Treating Innocence Schlotzhauer/Extender: Skipper Cliche in Treatment: 27 Wound Status Wound Number: 1 Primary Pressure Ulcer Etiology: Wound Location: Right, Lateral Malleolus Wound Open Wounding Event: Gradually Appeared Status: Date Acquired: 10/22/2014 Comorbid Arrhythmia, Coronary Artery Disease, Hypertension, Weeks Of Treatment: 27 History: Peripheral Arterial Disease, History of pressure wounds, Clustered Wound: No Osteoarthritis Photos Wound Measurements Length: (cm) 0.2 Width: (cm) 0.3 Depth: (cm) 0.1 Area: (cm) 0.047 Volume: (cm) 0.005 % Reduction in Area: 94% % Reduction in Volume: 93.7% Epithelialization: Small (1-33%) Wound Description Classification: Category/Stage III Wound Margin: Flat and Intact Exudate Amount: Medium Exudate Type: Serous Exudate Color: amber Foul Odor After Cleansing: No Slough/Fibrino No Wound Bed Granulation Amount: Large (67-100%) Exposed Structure Granulation Quality: Red, Pink Fascia Exposed: No Necrotic Amount: None Present (0%) Fat Layer (Subcutaneous Tissue) Exposed: Yes Tendon Exposed: No Muscle Exposed: No Joint Exposed: No Bone Exposed: No Treatment Notes Wound #1 (  Malleolus) Wound Laterality: Right, Lateral Cleanser Soap and Water Discharge Instruction: Gently cleanse wound with antibacterial soap, rinse and pat dry prior to dressing wounds CLYDEAN, POSAS ([id number]) Peri-Wound Care Topical Primary Dressing Hydrofera Blue Ready Transfer Foam, 2.5x2.5 (in/in) Discharge Instruction: Apply Hydrofera Blue Ready to wound bed as directed Secondary Dressing Gauze Discharge Instruction: As directed: dry, moistened with saline or moistened with Dakins Solution Secured With Compression Wrap Compression Stockings Add-Ons Tubigrip D Electronic  Signature(s) Signed: 05/01/2021 4:30:36 PM By: Gretta Cool, BSN, RN, CWS, Kim RN, BSN Entered By: Gretta Cool, BSN, RN, CWS, Kim on 04/28/2021 15:37:32 Rusconi, [name] ([id number]) -------------------------------------------------------------------------------- [place] Details Patient Name: [name] Date of Service: 04/28/2021 3:15 PM Medical Record Number: [id number] Patient Account Number: [id number] Date of Birth/Sex: [birth year] (87 y.o. F) Treating RN: Cornell Barman Primary Care Walid Haig: Viviana Simpler Other Clinician: Referring Branna Cortina: Viviana Simpler Treating Loryn Haacke/Extender: Skipper Cliche in Treatment: 27 Vital Signs Time Taken: 15:28 Temperature (F): 97.7 Height (in): 66 Pulse (bpm): 73 Weight (lbs): 76 Respiratory Rate (breaths/min): 18 Body Mass Index (BMI): 12.3 Blood Pressure (mmHg): 141/80 Reference Range: 80 - 120 mg / dl Electronic Signature(s) Signed: 05/01/2021 4:30:36 PM By: Gretta Cool, BSN, RN, CWS, Kim RN, BSN Entered By: Gretta Cool, BSN, RN, CWS, Kim on 04/28/2021 15:29:01

## 2021-04-28 NOTE — Progress Notes (Addendum)
[name], [name] ([id number]) Visit Report for 04/28/2021 Chief Complaint Document Details Patient Name: [name], [name]. Date of Service: 04/28/2021 3:15 PM Medical Record Number: [id number] Patient Account Number: [id number] Date of Birth/Sex: 1928-02-10 (87 y.o. F) Treating RN: Cornell Barman Primary Care Provider: Viviana Simpler Other Clinician: Referring Provider: Viviana Simpler Treating Provider/Extender: Skipper Cliche in Treatment: 27 Information Obtained from: Patient Chief Complaint Pressure ulcer right ankle Electronic Signature(s) Signed: 04/28/2021 3:25:15 PM By: Worthy Keeler PA-C Entered By: Worthy Keeler on 04/28/2021 15:25:15 [name], [name] ([id number]) -------------------------------------------------------------------------------- HPI Details Patient Name: [name] Date of Service: 04/28/2021 3:15 PM Medical Record Number: [id number] Patient Account Number: [id number] Date of Birth/Sex: 11-12-1927 (87 y.o. F) Treating RN: Cornell Barman Primary Care Provider: Viviana Simpler Other Clinician: Referring Provider: Viviana Simpler Treating Provider/Extender: Skipper Cliche in Treatment: 27 History of Present Illness HPI Description: 10/21/2020 upon evaluation today patient presents for initial inspection here in our clinic concerning issues that she has been having quite some time in regard to her ankle and since she has been in the hospital with regard to the heel. The ankle in fact has been 5-6 years at least I am told. With that being said the heel ulcer occurred when she was in the hospital in December for hip surgery when she fractured her hip. She also was in the hospital Tuesday for altered mental status. Really there was nothing that was identified as the cause for this. She did have a. Fortunately there does not appear to be any signs of infection she tells me that she has had she believes arterial studies At [name] clinic I could not find Those  studies at this point. Nonetheless I will continue to look and see what I can find before Then. The patient also has a skin tear on her arm this occurred more recently when she bumped this at home. The patient does have a history of hypertension, coronary artery disease, and peripheral vascular disease stated. 10/28/2020 I was able to find the patient's chart currently which shows that she did have an arterial study performed in May 2019. This showed that she had a abnormal right toe brachial index and a normal left toe brachial index. She was noncompressible as far as ABIs were concerned. She did appear to have triphasic flow at that time. Unfortunately the wound that is commented on in the report that I printed off and read mentions the same wound on the ankle that we are still dealing with at this point. Unfortunately this has not healed. And its been quite sometime about 3 years now. Fortunately there does not appear to be any signs of active infection systemically at this point. I think that the patient has done well with the Santyl over the past week which is good news. Patient's caregiver which is her daughter-in-law is concerned about the fact that she really does not feel qualified to be able to change the dressings and take care of this issue. There does not appear to be any signs of anything untoward going on at this point. I think she is done a great job applying the [employer] and I think that has done a great job for the patient is for soften up some of the necrotic tissue. With that being said I think the Xeroform on the arm also has done excellent. In general I am very pleased with where we stand. And I told the patient's daughter-in- law as well that I also feel  like she has done a great job over the past week taking care of her mother. 11/11/2020 upon evaluation today patient appears to be doing well with regard to her wounds. She has been tolerating the dressing changes without  complication her daughters been applying the Santyl which has done a great job. Ho with that being said I think now that we have good arterial study showing we can go ahead and proceed with sharp debridement at this point. 11/25/2020 upon evaluation today patient appears to be doing well with regard to her wounds. The Santyl has really helped to clean things up and overall she is doing quite excellent at this point. Fortunately there does not appear to be any signs of infection which is great news and in general extremely pleased. I do believe some debridement is in order and hopefully will be able to get her into a collagen dressing after this. 12/23/2020 upon evaluation today patient appears to be doing well with regard to her wound all things considered. Unfortunately it does sound like her daughter decided to let this air out because she felt like it was getting so wet. It sounds like she got border gauze dressings instead of border foam therefore there is really Nothing to catch the excess drainage which I think is the problem they were worried about the smell. Fortunately there does not appear to be any signs of active infection which is great news. Nonetheless I do not think we want to leave this just open to air. 01/13/2021 upon evaluation today patient's wounds actually appear to be about as good as have seen since have been taking care of her. Fortunately there does not appear to be any evidence of infection which is great and overall the biggest issue I see is that of fluid buildup. I think we need to do something to try to help manage this. I think if we can control her edema we get the wounds to heal more effectively. 01/27/2021 upon evaluation today patient appears to be doing well in regard to the wounds on her right lateral malleolus as well as the left heel and the right ischial tuberosity is unfortunately a new area that has arisen since we last saw her. She tells me currently that that is  from sitting too much. With that being said this actually appears to be doing the worst of anything so far that I see today. Fortunately there does not appear to be any signs of infection this is at least good news. Compression wrap seem to be doing excellent for her as far as the legs are concerned. 02/03/2021 upon evaluation today patient appears to be doing well with regard to all of her wounds. She has been tolerating the dressing changes without complication. Fortunately there is no signs of active infection at this time. No fevers, chills, nausea, vomiting, or diarrhea. 02/10/2021 upon evaluation today patient appears to be doing well with regard to her wounds. With that being said there is some slight evidence of hypergranulation in regard to the right ankle in particular and a little bit in regard to the left heel. I think Hydrofera Blue might be a better option to go to at this point based on what I am seeing especially since both of these wounds in particular seem to be a little bit stalled. I discussed that with the patient and her daughter today. With regard to the hip this is doing great with the collagen I think will get very close to  complete closure I recommend we continue with the collagen. 02/17/2021 upon evaluation today patient appears to be doing excellent in regard to her heel ulcers. She has been tolerating the dressing changes without complication and overall I am extremely pleased with where things stand today. There does not appear to be any signs of active infection which is great news. Overall I may think that we are headed in the proper direction based on what I am seeing. 02/24/2021 upon evaluation today patient's wounds actually are showing signs of improvement in regard to her heels both are doing awesome. In regard to her hip location this is actually reopened after being closed last week and to be perfectly honest I am more concerned here about the fact that pressures  causing this issue I do not think it has anything to do with her not having the collagen in place again it was completely healed last week there was no reason for the collagen to be there. 03/03/2021 upon evaluation today patient appears to be doing well with regard to her wounds. Everything is showing signs of improvement and TRINETTA, ALEMU. ([id number]) doing some much better as far as the overall size of the wound. Fortunately I think we are headed in the right direction. 03/10/2021 upon evaluation today patient appears to be doing well with regard to her wound. She is tolerating the dressing changes without complication. The left heel pretty much appears to be almost completely healed although I think it is probably can be 1 more week before I can call this 100% slough. The right ankle is significantly improved with that being said its not completely closed. With regard to the right hip this is completely closed. Overall I am very pleased with where things stand I think were getting very close to complete resolution. I discussed all this with the patient and her daughter-in-law today. 03/17/2021 upon evaluation today patient appears to be doing well with regard to her wounds. Fortunately there does not appear to be any signs of active infection which is great news and overall very pleased with where the patient stands today. I am in general thankful that overall she is making great progress here. There is good to be a little bit of debridement here to clear away some of the necrotic debris today on both wound locations. 03/24/2021 upon evaluation today patient appears to be doing excellent in regard to her wound. She has been tolerating the dressing changes without complication. Fortunately there does not appear to be any evidence of infection the left foot is completely healed this is great news. On the right at the ankle region this is still open though I do not think the alginate did quite as well  as I was hoping as far as getting this dried out. I think that we may just want to switch back to the [facility] which has done well up to this point. I was just hopeful this would wrap things up completely for [place]. 03/31/2021 upon evaluation today patient appears to be doing well with regard to her wound. This is on the right side which appears to be doing better than last week I think is definitely measuring smaller and this is great news. Overall I am very pleased with where we stand today. I do believe the [facility] is doing better for her. 04/14/2021 upon evaluation today patient appears to be doing well with regard to her wound. I am very pleased with the way it stands I think  that she is headed in the right direction. Unfortunately she is having issues with the Tubigrip neither she nor her daughter-in-law who is the primary caregiver can get this on. Subsequently that means that they have been having a very difficult time with complying with the necessary things for this left leg. With that being said I am really not sure what to do to help in that regard. We can always try a bigger that is larger size Tubigrip but at the same time that means that she may not get as good compression and it may not keep things under control the only way to know is to try. 04/21/2021 upon evaluation today patient appears to be doing well in regard to her wound. She has been tolerating the dressing changes without complication and in fact the right ankle ulcer is actually showing signs of excellent improvement I am very pleased with where things stand today. No fevers, chills, nausea, vomiting, or diarrhea. 04/28/2021 upon evaluation today patient appears to be doing well with regard to her ankle ulcer. This is actually showing signs of good improvement which is great news and overall very pleased with where we stand today. No fevers, chills, nausea, vomiting, or diarrhea. Electronic Signature(s) Signed:  04/28/2021 4:02:37 PM By: Worthy Keeler PA-C Entered By: Worthy Keeler on 04/28/2021 16:02:37 [name], [name] ([id number]) -------------------------------------------------------------------------------- Physical Exam Details Patient Name: [name], [name] Date of Service: 04/28/2021 3:15 PM Medical Record Number: [id number] Patient Account Number: [id number] Date of Birth/Sex: [birth year] (87 y.o. F) Treating RN: Cornell Barman Primary Care Provider: Viviana Simpler Other Clinician: Referring Provider: Viviana Simpler Treating Provider/Extender: Skipper Cliche in Treatment: 67 Constitutional Well-nourished and well-hydrated in no acute distress. Respiratory normal breathing without difficulty. Psychiatric this patient is able to make decisions and demonstrates good insight into disease process. Alert and Oriented x 3. pleasant and cooperative. Notes Patient's wound bed showed signs of good granulation and epithelization at this point. Fortunately there does not appear to be any evidence of active infection which is great news and overall I am extremely happy with where we stand. I think the patient is making excellent progress which is great news. Electronic Signature(s) Signed: 04/28/2021 4:02:58 PM By: Worthy Keeler PA-C Entered By: Worthy Keeler on 04/28/2021 16:02:58 [name], [name] ([id number]) -------------------------------------------------------------------------------- Physician Orders Details Patient Name: [name], [name] Date of Service: 04/28/2021 3:15 PM Medical Record Number: [id number] Patient Account Number: [id number] Date of Birth/Sex: 12/20/1927 (87 y.o. F) Treating RN: Cornell Barman Primary Care Provider: Viviana Simpler Other Clinician: Referring Provider: Viviana Simpler Treating Provider/Extender: Skipper Cliche in Treatment: 81 Verbal / Phone Orders: No Diagnosis Coding ICD-10 Coding Code Description L89.513 Pressure ulcer of right ankle, stage  3 I10 Essential (primary) hypertension I25.10 Atherosclerotic heart disease of native coronary artery without angina pectoris I73.89 Other specified peripheral vascular diseases Follow-up Appointments o Return Appointment in 1 week. o Nurse Visit as needed Bathing/ Shower/ Hygiene o May shower with wound dressing protected with water repellent cover or cast protector. o No tub bath. Edema Control - Lymphedema / Segmental Compressive Device / Other Bilateral Lower Extremities o Optional: One layer of unna paste to top of compression wrap (to act as an anchor). o Tubigrip single layer applied. - Left leg-tubi grip D, cut about 50 cm long o Elevate legs to the level of the heart and pump ankles as often as possible o Elevate leg(s) parallel to the floor when sitting. o DO YOUR BEST to  sleep in the bed at night. DO NOT sleep in your recliner. Long hours of sitting in a recliner leads to swelling of the legs and/or potential wounds on your backside. Wound Treatment Wound #1 - Malleolus Wound Laterality: Right, Lateral Cleanser: Soap and Water 1 x Per Week/30 Days Discharge Instructions: Gently cleanse wound with antibacterial soap, rinse and pat dry prior to dressing wounds Primary Dressing: Hydrofera Blue Ready Transfer Foam, 2.5x2.5 (in/in) 1 x Per Week/30 Days Discharge Instructions: Apply Hydrofera Blue Ready to wound bed as directed Secondary Dressing: Gauze 1 x Per Week/30 Days Discharge Instructions: As directed: dry, moistened with saline or moistened with Dakins Solution Add-Ons: Tubigrip D 1 x Per Week/30 Days Electronic Signature(s) Signed: 04/28/2021 4:28:58 PM By: Worthy Keeler PA-C Signed: 05/01/2021 4:30:36 PM By: Gretta Cool, BSN, RN, CWS, Kim RN, BSN Entered By: Gretta Cool, BSN, RN, CWS, Kim on 04/28/2021 15:53:03 [name], [name] ([id number]) -------------------------------------------------------------------------------- Problem List Details Patient Name:  [name], [name]. Date of Service: 04/28/2021 3:15 PM Medical Record Number: [id number] Patient Account Number: [id number] Date of Birth/Sex: Aug 14, 1927 (87 y.o. F) Treating RN: Cornell Barman Primary Care Provider: Viviana Simpler Other Clinician: Referring Provider: Viviana Simpler Treating Provider/Extender: Skipper Cliche in Treatment: 27 Active Problems ICD-10 Encounter Code Description Active Date MDM Diagnosis L89.513 Pressure ulcer of right ankle, stage 3 10/21/2020 No Yes I10 Essential (primary) hypertension 10/21/2020 No Yes I25.10 Atherosclerotic heart disease of native coronary artery without angina 10/21/2020 No Yes pectoris I73.89 Other specified peripheral vascular diseases 10/21/2020 No Yes Inactive Problems Resolved Problems ICD-10 Code Description Active Date Resolved Date L89.623 Pressure ulcer of left heel, stage 3 10/21/2020 10/21/2020 S51.802A Unspecified open wound of left forearm, initial encounter 10/21/2020 10/21/2020 L89.312 Pressure ulcer of right buttock, stage 2 01/27/2021 01/27/2021 Electronic Signature(s) Signed: 04/28/2021 3:25:07 PM By: Worthy Keeler PA-C Entered By: Worthy Keeler on 04/28/2021 15:25:06 [name], [name] ([id number]) -------------------------------------------------------------------------------- Progress Note Details Patient Name: [name] Date of Service: 04/28/2021 3:15 PM Medical Record Number: [id number] Patient Account Number: [id number] Date of Birth/Sex: Jun 09, 1928 (87 y.o. F) Treating RN: Cornell Barman Primary Care Provider: Viviana Simpler Other Clinician: Referring Provider: Viviana Simpler Treating Provider/Extender: Skipper Cliche in Treatment: 27 Subjective Chief Complaint Information obtained from Patient Pressure ulcer right ankle History of Present Illness (HPI) 10/21/2020 upon evaluation today patient presents for initial inspection here in our clinic concerning issues that she has been having quite some time  in regard to her ankle and since she has been in the hospital with regard to the heel. The ankle in fact has been 5-6 years at least I am told. With that being said the heel ulcer occurred when she was in the hospital in December for hip surgery when she fractured her hip. She also was in the hospital Tuesday for altered mental status. Really there was nothing that was identified as the cause for this. She did have a. Fortunately there does not appear to be any signs of infection she tells me that she has had she believes arterial studies At [facility] clinic I could not find Those studies at this point. Nonetheless I will continue to look and see what I can find before Then. The patient also has a skin tear on her arm this occurred more recently when she bumped this at home. The patient does have a history of hypertension, coronary artery disease, and peripheral vascular disease stated. 10/28/2020 I was able to find the patient's chart currently which shows that she  did have an arterial study performed in May 2019. This showed that she had a abnormal right toe brachial index and a normal left toe brachial index. She was noncompressible as far as ABIs were concerned. She did appear to have triphasic flow at that time. Unfortunately the wound that is commented on in the report that I printed off and read mentions the same wound on the ankle that we are still dealing with at this point. Unfortunately this has not healed. And its been quite sometime about 3 years now. Fortunately there does not appear to be any signs of active infection systemically at this point. I think that the patient has done well with the Santyl over the past week which is good news. Patient's caregiver which is her daughter-in-law is concerned about the fact that she really does not feel qualified to be able to change the dressings and take care of this issue. There does not appear to be any signs of anything untoward going on at this  point. I think she is done a great job applying the [employer] and I think that has done a great job for the patient is for soften up some of the necrotic tissue. With that being said I think the Xeroform on the arm also has done excellent. In general I am very pleased with where we stand. And I told the patient's daughter-in- law as well that I also feel like she has done a great job over the past week taking care of her mother. 11/11/2020 upon evaluation today patient appears to be doing well with regard to her wounds. She has been tolerating the dressing changes without complication her daughters been applying the Santyl which has done a great job. Ho with that being said I think now that we have good arterial study showing we can go ahead and proceed with sharp debridement at this point. 11/25/2020 upon evaluation today patient appears to be doing well with regard to her wounds. The Santyl has really helped to clean things up and overall she is doing quite excellent at this point. Fortunately there does not appear to be any signs of infection which is great news and in general extremely pleased. I do believe some debridement is in order and hopefully will be able to get her into a collagen dressing after this. 12/23/2020 upon evaluation today patient appears to be doing well with regard to her wound all things considered. Unfortunately it does sound like her daughter decided to let this air out because she felt like it was getting so wet. It sounds like she got border gauze dressings instead of border foam therefore there is really Nothing to catch the excess drainage which I think is the problem they were worried about the smell. Fortunately there does not appear to be any signs of active infection which is great news. Nonetheless I do not think we want to leave this just open to air. 01/13/2021 upon evaluation today patient's wounds actually appear to be about as good as have seen since have been taking  care of her. Fortunately there does not appear to be any evidence of infection which is great and overall the biggest issue I see is that of fluid buildup. I think we need to do something to try to help manage this. I think if we can control her edema we get the wounds to heal more effectively. 01/27/2021 upon evaluation today patient appears to be doing well in regard to the wounds on her  right lateral malleolus as well as the left heel and the right ischial tuberosity is unfortunately a new area that has arisen since we last saw her. She tells me currently that that is from sitting too much. With that being said this actually appears to be doing the worst of anything so far that I see today. Fortunately there does not appear to be any signs of infection this is at least good news. Compression wrap seem to be doing excellent for her as far as the legs are concerned. 02/03/2021 upon evaluation today patient appears to be doing well with regard to all of her wounds. She has been tolerating the dressing changes without complication. Fortunately there is no signs of active infection at this time. No fevers, chills, nausea, vomiting, or diarrhea. 02/10/2021 upon evaluation today patient appears to be doing well with regard to her wounds. With that being said there is some slight evidence of hypergranulation in regard to the right ankle in particular and a little bit in regard to the left heel. I think Hydrofera Blue might be a better option to go to at this point based on what I am seeing especially since both of these wounds in particular seem to be a little bit stalled. I discussed that with the patient and her daughter today. With regard to the hip this is doing great with the collagen I think will get very close to complete closure I recommend we continue with the collagen. 02/17/2021 upon evaluation today patient appears to be doing excellent in regard to her heel ulcers. She has been tolerating the  dressing changes without complication and overall I am extremely pleased with where things stand today. There does not appear to be any signs of active infection which is great news. Overall I may think that we are headed in the proper direction based on what I am seeing. 02/24/2021 upon evaluation today patient's wounds actually are showing signs of improvement in regard to her heels both are doing awesome. In [place], [place] ([id number]) regard to her hip location this is actually reopened after being closed last week and to be perfectly honest I am more concerned here about the fact that pressures causing this issue I do not think it has anything to do with her not having the collagen in place again it was completely healed last week there was no reason for the collagen to be there. 03/03/2021 upon evaluation today patient appears to be doing well with regard to her wounds. Everything is showing signs of improvement and doing some much better as far as the overall size of the wound. Fortunately I think we are headed in the right direction. 03/10/2021 upon evaluation today patient appears to be doing well with regard to her wound. She is tolerating the dressing changes without complication. The left heel pretty much appears to be almost completely healed although I think it is probably can be 1 more week before I can call this 100% slough. The right ankle is significantly improved with that being said its not completely closed. With regard to the right hip this is completely closed. Overall I am very pleased with where things stand I think were getting very close to complete resolution. I discussed all this with the patient and her daughter-in-law today. 03/17/2021 upon evaluation today patient appears to be doing well with regard to her wounds. Fortunately there does not appear to be any signs of active infection which is great news and overall very pleased  with where the patient stands today. I am in  general thankful that overall she is making great progress here. There is good to be a little bit of debridement here to clear away some of the necrotic debris today on both wound locations. 03/24/2021 upon evaluation today patient appears to be doing excellent in regard to her wound. She has been tolerating the dressing changes without complication. Fortunately there does not appear to be any evidence of infection the left foot is completely healed this is great news. On the right at the ankle region this is still open though I do not think the alginate did quite as well as I was hoping as far as getting this dried out. I think that we may just want to switch back to the [facility] which has done well up to this point. I was just hopeful this would wrap things up completely for [place]. 03/31/2021 upon evaluation today patient appears to be doing well with regard to her wound. This is on the right side which appears to be doing better than last week I think is definitely measuring smaller and this is great news. Overall I am very pleased with where we stand today. I do believe the [facility] is doing better for her. 04/14/2021 upon evaluation today patient appears to be doing well with regard to her wound. I am very pleased with the way it stands I think that she is headed in the right direction. Unfortunately she is having issues with the Tubigrip neither she nor her daughter-in-law who is the primary caregiver can get this on. Subsequently that means that they have been having a very difficult time with complying with the necessary things for this left leg. With that being said I am really not sure what to do to help in that regard. We can always try a bigger that is larger size Tubigrip but at the same time that means that she may not get as good compression and it may not keep things under control the only way to know is to try. 04/21/2021 upon evaluation today patient appears to be doing well  in regard to her wound. She has been tolerating the dressing changes without complication and in fact the right ankle ulcer is actually showing signs of excellent improvement I am very pleased with where things stand today. No fevers, chills, nausea, vomiting, or diarrhea. 04/28/2021 upon evaluation today patient appears to be doing well with regard to her ankle ulcer. This is actually showing signs of good improvement which is great news and overall very pleased with where we stand today. No fevers, chills, nausea, vomiting, or diarrhea. Objective Constitutional Well-nourished and well-hydrated in no acute distress. Vitals Time Taken: 3:28 PM, Height: 66 in, Weight: 76 lbs, BMI: 12.3, Temperature: 97.7 F, Pulse: 73 bpm, Respiratory Rate: 18 breaths/min, Blood Pressure: 141/80 mmHg. Respiratory normal breathing without difficulty. Psychiatric this patient is able to make decisions and demonstrates good insight into disease process. Alert and Oriented x 3. pleasant and cooperative. General Notes: Patient's wound bed showed signs of good granulation and epithelization at this point. Fortunately there does not appear to be any evidence of active infection which is great news and overall I am extremely happy with where we stand. I think the patient is making excellent progress which is great news. Integumentary (Hair, Skin) Wound #1 status is Open. Original cause of wound was Gradually Appeared. The date acquired was: 10/22/2014. The wound has been in treatment 27 weeks. The  wound is located on the Right,Lateral Malleolus. The wound measures 0.2cm length x 0.3cm width x 0.1cm depth; 0.047cm^2 area and 0.005cm^3 volume. There is Fat Layer (Subcutaneous Tissue) exposed. There is a medium amount of serous drainage noted. The wound margin is flat and intact. There is large (67-100%) red, pink granulation within the wound bed. There is no necrotic tissue within the wound bed. [name], [name]  ([id number]) Assessment Active Problems ICD-10 Pressure ulcer of right ankle, stage 3 Essential (primary) hypertension Atherosclerotic heart disease of native coronary artery without angina pectoris Other specified peripheral vascular diseases Plan Follow-up Appointments: Return Appointment in 1 week. Nurse Visit as needed Bathing/ Shower/ Hygiene: May shower with wound dressing protected with water repellent cover or cast protector. No tub bath. Edema Control - Lymphedema / Segmental Compressive Device / Other: Optional: One layer of unna paste to top of compression wrap (to act as an anchor). Tubigrip single layer applied. - Left leg-tubi grip D, cut about 50 cm long Elevate legs to the level of the heart and pump ankles as often as possible Elevate leg(s) parallel to the floor when sitting. DO YOUR BEST to sleep in the bed at night. DO NOT sleep in your recliner. Long hours of sitting in a recliner leads to swelling of the legs and/or potential wounds on your backside. WOUND #1: - Malleolus Wound Laterality: Right, Lateral Cleanser: Soap and Water 1 x Per Week/30 Days Discharge Instructions: Gently cleanse wound with antibacterial soap, rinse and pat dry prior to dressing wounds Primary Dressing: Hydrofera Blue Ready Transfer Foam, 2.5x2.5 (in/in) 1 x Per Week/30 Days Discharge Instructions: Apply Hydrofera Blue Ready to wound bed as directed Secondary Dressing: Gauze 1 x Per Week/30 Days Discharge Instructions: As directed: dry, moistened with saline or moistened with Dakins Solution Add-Ons: Tubigrip D 1 x Per Week/30 Days 1. Would recommend that we continue with the [facility] as this seems to be doing well for the patient she is in agreement with plan. 2. I am also can recommend that we actually switch her over to just using a Tubigrip on both legs I think this to be a good way to go and they are in agreement with that plan as well. 3. I am also can allow the patient to go  ahead and start taking a shower she really did want to do this I see absolutely no reason why she cannot. We will see patient back for reevaluation in 1 week here in the clinic. If anything worsens or changes patient will contact our office for additional recommendations. Electronic Signature(s) Signed: 04/28/2021 4:03:29 PM By: Worthy Keeler PA-C Entered By: Worthy Keeler on 04/28/2021 16:03:29 [name], [name] ([id number]) -------------------------------------------------------------------------------- SuperBill Details Patient Name: [name], [name] Date of Service: 04/28/2021 Medical Record Number: [id number] Patient Account Number: [id number] Date of Birth/Sex: [birth year] (87 y.o. F) Treating RN: Cornell Barman Primary Care Provider: Viviana Simpler Other Clinician: Referring Provider: Viviana Simpler Treating Provider/Extender: Skipper Cliche in Treatment: 27 Diagnosis Coding ICD-10 Codes Code Description [phone] Pressure ulcer of right ankle, stage 3 I10 Essential (primary) hypertension I25.10 Atherosclerotic heart disease of native coronary artery without angina pectoris I73.89 Other specified peripheral vascular diseases Facility Procedures CPT4 Code: [id number] Description: 99213 - WOUND CARE VISIT-LEV 3 EST PT Modifier: Quantity: 1 Physician Procedures CPT4 Code: 2426834 Description: 99213 - WC PHYS LEVEL 3 - EST PT Modifier: Quantity: 1 CPT4 Code: Description: ICD-10 Diagnosis Description L89.513 Pressure ulcer of right ankle, stage 3 I10 Essential (  primary) hypertension I25.10 Atherosclerotic heart disease of native coronary artery without angina I73.89 Other specified peripheral vascular  diseases Modifier: pectoris Quantity: Electronic Signature(s) Signed: 05/01/2021 1:09:20 PM By: Gretta Cool, BSN, RN, CWS, Kim RN, BSN Signed: 05/01/2021 4:31:19 PM By: Worthy Keeler PA-C Previous Signature: 04/28/2021 4:05:29 PM Version By: Worthy Keeler PA-C Entered By: Gretta Cool,  BSN, RN, CWS, Kim on 05/01/2021 13:09:19

## 2021-05-05 ENCOUNTER — Other Ambulatory Visit: Payer: Self-pay

## 2021-05-05 ENCOUNTER — Encounter: Payer: PPO | Admitting: Physician Assistant

## 2021-05-05 DIAGNOSIS — L89513 Pressure ulcer of right ankle, stage 3: Secondary | ICD-10-CM | POA: Diagnosis not present

## 2021-05-05 NOTE — Progress Notes (Signed)
[name], [name] ([id number]) Visit Report for 05/05/2021 Arrival Information Details Patient Name: [name], [name] Date of Service: 05/05/2021 3:15 PM Medical Record Number: [id number] Patient Account Number: [id number] Date of Birth/Sex: Jan 07, 1928 (87 y.o. F) Treating RN: [name] Primary Care [name]: [name] Other Clinician: Referring [name]: [name] Treating [name] Vital/Extender: [name] in Treatment: 28 Visit Information History Since Last Visit Added or deleted any medications: No Patient Arrived: [name] Had a fall or experienced change in No Arrival Time: 15:22 activities of daily living that may affect Accompanied By: daughter in law risk of falls: Transfer Assistance: None Hospitalized since last visit: No Patient Identification Verified: Yes Has Dressing in Place as Prescribed: Yes Secondary Verification Process Completed: Yes Has Compression in Place as Prescribed: Yes Patient Requires Transmission-Based Precautions: No Pain Present Now: No Patient Has Alerts: Yes Patient Alerts: NOT DIABETIC TBI left .77 TBI right .76 Electronic Signature(s) Signed: 05/05/2021 4:20:45 PM By: [name] Entered By: [name] on 05/05/2021 15:27:39 [name], [name] ([id number]) -------------------------------------------------------------------------------- Clinic Level of Care Assessment Details Patient Name: [name], [name] Date of Service: 05/05/2021 3:15 PM Medical Record Number: [id number] Patient Account Number: [id number] Date of Birth/Sex: [birth year] (87 y.o. F) Treating RN: [name] Primary Care [name]: [name] Other Clinician: Referring [name]: [name] Treating [name]/Extender: [name] in Treatment: 28 Clinic Level of Care Assessment Items TOOL 4 Quantity Score []  - Use when only an EandM is performed on FOLLOW-UP visit 0 ASSESSMENTS - Nursing Assessment / Reassessment []  - Reassessment of  Co-morbidities (includes updates in patient status) 0 []  - 0 Reassessment of Adherence to Treatment Plan ASSESSMENTS - Wound and Skin Assessment / Reassessment X - Simple Wound Assessment / Reassessment - one wound 1 5 []  - 0 Complex Wound Assessment / Reassessment - multiple wounds []  - 0 Dermatologic / Skin Assessment (not related to wound area) ASSESSMENTS - Focused Assessment []  - Circumferential Edema Measurements - multi extremities 0 []  - 0 Nutritional Assessment / Counseling / Intervention []  - 0 Lower Extremity Assessment (monofilament, tuning fork, pulses) []  - 0 Peripheral Arterial Disease Assessment (using hand held doppler) ASSESSMENTS - Ostomy and/or Continence Assessment and Care []  - Incontinence Assessment and Management 0 []  - 0 Ostomy Care Assessment and Management (repouching, etc.) PROCESS - Coordination of Care X - Simple Patient / Family Education for ongoing care 1 15 []  - 0 Complex (extensive) Patient / Family Education for ongoing care []  - 0 Staff obtains Programmer, systems, Records, Test Results / Process Orders []  - 0 Staff telephones HHA, Nursing Homes / Clarify orders / etc []  - 0 Routine Transfer to another Facility (non-emergent condition) []  - 0 Routine Hospital Admission (non-emergent condition) []  - 0 New Admissions / Biomedical engineer / Ordering NPWT, Apligraf, etc. []  - 0 Emergency Hospital Admission (emergent condition) X- 1 10 Simple Discharge Coordination []  - 0 Complex (extensive) Discharge Coordination PROCESS - Special Needs []  - Pediatric / Minor Patient Management 0 []  - 0 Isolation Patient Management []  - 0 Hearing / Language / Visual special needs []  - 0 Assessment of Community assistance (transportation, D/C planning, etc.) []  - 0 Additional assistance / Altered mentation []  - 0 Support Surface(s) Assessment (bed, cushion, seat, etc.) INTERVENTIONS - Wound Cleansing / Measurement [name] ([id number]) X- 1  5 Simple Wound Cleansing - one wound []  - 0 Complex Wound Cleansing - multiple wounds X- 1 5 Wound Imaging (photographs - any number of wounds) []  - 0 Wound Tracing (  instead of photographs) X- 1 5 Simple Wound Measurement - one wound []  - 0 Complex Wound Measurement - multiple wounds INTERVENTIONS - Wound Dressings X - Small Wound Dressing one or multiple wounds 1 10 []  - 0 Medium Wound Dressing one or multiple wounds []  - 0 Large Wound Dressing one or multiple wounds X- 1 5 Application of Medications - topical []  - 0 Application of Medications - injection INTERVENTIONS - Miscellaneous []  - External ear exam 0 []  - 0 Specimen Collection (cultures, biopsies, blood, body fluids, etc.) []  - 0 Specimen(s) / Culture(s) sent or taken to Lab for analysis []  - 0 Patient Transfer (multiple staff / Civil Service fast streamer / Similar devices) []  - 0 Simple Staple / Suture removal (25 or less) []  - 0 Complex Staple / Suture removal (26 or more) []  - 0 Hypo / Hyperglycemic Management (close monitor of Blood Glucose) []  - 0 Ankle / Brachial Index (ABI) - do not check if billed separately X- 1 5 Vital Signs Has the patient been seen at the hospital within the last three years: Yes Total Score: 65 Level Of Care: New/Established - Level 2 Electronic Signature(s) Signed: 05/05/2021 4:20:45 PM By: [name] Entered By: [name] on 05/05/2021 15:52:05 [name], [name] ([id number]) -------------------------------------------------------------------------------- Encounter Discharge Information Details Patient Name: Downs, [name] Date of Service: 05/05/2021 3:15 PM Medical Record Number: [id number] Patient Account Number: [id number] Date of Birth/Sex: [birth year] (87 y.o. F) Treating RN: [name] Primary Care [name]: [name] Other Clinician: Referring [name]: [name] Treating [name]/Extender: [name] in Treatment: 28 Encounter Discharge Information  Items Discharge Condition: Stable Ambulatory Status: Walker Discharge Destination: Home Transportation: Private Auto Accompanied By: daughter in law Schedule Follow-up Appointment: Yes Clinical Summary of Care: Electronic Signature(s) Signed: 05/05/2021 4:20:45 PM By: [name] Entered By: [name] on 05/05/2021 15:53:35 Russom, [name] ([id number]) -------------------------------------------------------------------------------- Lower Extremity Assessment Details Patient Name: [name], [name] Date of Service: 05/05/2021 3:15 PM Medical Record Number: [id number] Patient Account Number: [id number] Date of Birth/Sex: Nov 08, 1927 (87 y.o. F) Treating RN: [name] Primary Care Odeth Bry: [name] Other Clinician: Referring Adasha Boehme: [name] Treating Cataleah Stites/Extender: [name] in Treatment: 28 Edema Assessment Assessed: [Left: No] Patrice Paradise: Yes] [Left: Edema] [Right: :] Calf Left: Right: Point of Measurement: 33 cm From Medial Instep 21 cm Ankle Left: Right: Point of Measurement: 9 cm From Medial Instep 19 cm Vascular Assessment Pulses: Dorsalis Pedis Palpable: [Right:Yes] Electronic Signature(s) Signed: 05/05/2021 4:20:45 PM By: [name] Entered By: [name] on 05/05/2021 15:32:43 Bently, [name] ([id number]) -------------------------------------------------------------------------------- Multi Wound Chart Details Patient Name: [name] Date of Service: 05/05/2021 3:15 PM Medical Record Number: [id number] Patient Account Number: [id number] Date of Birth/Sex: 1927-10-24 (87 y.o. F) Treating RN: [name] Primary Care Hayk Divis: [name] Other Clinician: Referring Dallon Dacosta: [name] Treating Aubreyanna Dorrough/Extender: [name] in Treatment: 28 Vital Signs Height(in): 66 Pulse(bpm): 37 Weight(lbs): 8 Blood Pressure(mmHg): 167/69 Body Mass Index(BMI): 12 Temperature(F): 98.5 Respiratory Rate(breaths/min):  16 Photos: [N/A:N/A] Wound Location: Right, Lateral Malleolus N/A N/A Wounding Event: Gradually Appeared N/A N/A Primary Etiology: Pressure Ulcer N/A N/A Comorbid History: Arrhythmia, Coronary Artery N/A N/A Disease, Hypertension, Peripheral Arterial Disease, History of pressure wounds, Osteoarthritis Date Acquired: 10/22/2014 N/A N/A Weeks of Treatment: 28 N/A N/A Wound Status: Open N/A N/A Measurements L x W x D (cm) 0.4x0.6x0.1 N/A N/A Area (cm) : 0.188 N/A N/A Volume (cm) : 0.019 N/A N/A % Reduction in Area: 76.10% N/A N/A % Reduction in Volume:  75.90% N/A N/A Classification: Category/Stage III N/A N/A Exudate Amount: Medium N/A N/A Exudate Type: Serous N/A N/A Exudate Color: amber N/A N/A Wound Margin: Flat and Intact N/A N/A Granulation Amount: Large (67-100%) N/A N/A Granulation Quality: Red N/A N/A Necrotic Amount: Small (1-33%) N/A N/A Exposed Structures: Fat Layer (Subcutaneous Tissue): N/A N/A Yes Fascia: No Tendon: No Muscle: No Joint: No Bone: No Epithelialization: Small (1-33%) N/A N/A Treatment Notes Electronic Signature(s) Signed: 05/05/2021 4:20:45 PM By: [name] Entered By: [name] on 05/05/2021 15:33:16 Kasparian, [name] ([id number]) -------------------------------------------------------------------------------- [place] Details Patient Name: [name], [name] Date of Service: 05/05/2021 3:15 PM Medical Record Number: [id number] Patient Account Number: [id number] Date of Birth/Sex: [birth year] (87 y.o. F) Treating RN: [name] Primary Care Azeneth Carbonell: [name] Other Clinician: Referring Derya Dettmann: [name] Treating Joline Encalada/Extender: [name] in Treatment: 28 Active Inactive Electronic Signature(s) Signed: 05/05/2021 4:20:45 PM By: [name] Entered By: [name] on 05/05/2021 15:32:50 Cavins, [name]  ([id number]) -------------------------------------------------------------------------------- Pain Assessment Details Patient Name: [name], [name] Date of Service: 05/05/2021 3:15 PM Medical Record Number: [id number] Patient Account Number: [id number] Date of Birth/Sex: 31-Jul-1927 (87 y.o. F) Treating RN: [name] Primary Care Maaz Spiering: [name] Other Clinician: Referring Cyd Hostler: [name] Treating Asencion Guisinger/Extender: [name] in Treatment: 28 Active Problems Location of Pain Severity and Description of Pain Patient Has Paino No Site Locations Rate the pain. Current Pain Level: 0 Pain Management and Medication Current Pain Management: Electronic Signature(s) Signed: 05/05/2021 4:20:45 PM By: [name] Entered By: [name] on 05/05/2021 15:28:09 [name] ([id number]) -------------------------------------------------------------------------------- Patient/Caregiver Education Details Patient Name: [name], [name] Date of Service: 05/05/2021 3:15 PM Medical Record Number: [id number] Patient Account Number: [id number] Date of Birth/Gender: 1928/05/23 (87 y.o. F) Treating RN: [name] Primary Care Physician: [name] Other Clinician: Referring Physician: Viviana Downs Treating Physician/Extender: [name] in Treatment: 24 Education Assessment Education Provided To: Patient Education Topics Provided Basic Hygiene: Venous: Wound/Skin Impairment: Engineer, maintenance) Signed: 05/05/2021 4:20:45 PM By: [name] Entered By: [name] on 05/05/2021 15:52:55 Parfait, [name] ([id number]) -------------------------------------------------------------------------------- Wound Assessment Details Patient Name: [name], [name] Date of Service: 05/05/2021 3:15 PM Medical Record Number: [id number] Patient Account Number: [id number] Date of Birth/Sex: [birth year] (87 y.o. F) Treating RN: [name] Primary Care Husein Guedes:  [name] Other Clinician: Referring Rhydian Baldi: [name] Treating Mickeal Daws/Extender: [name] in Treatment: 28 Wound Status Wound Number: 1 Primary Pressure Ulcer Etiology: Wound Location: Right, Lateral Malleolus Wound Open Wounding Event: Gradually Appeared Status: Date Acquired: 10/22/2014 Comorbid Arrhythmia, Coronary Artery Disease, Hypertension, Weeks Of Treatment: 28 History: Peripheral Arterial Disease, History of pressure wounds, Clustered Wound: No Osteoarthritis Photos Wound Measurements Length: (cm) 0.4 Width: (cm) 0.6 Depth: (cm) 0.1 Area: (cm) 0.188 Volume: (cm) 0.019 % Reduction in Area: 76.1% % Reduction in Volume: 75.9% Epithelialization: Small (1-33%) Tunneling: No Undermining: No Wound Description Classification: Category/Stage III Wound Margin: Flat and Intact Exudate Amount: Medium Exudate Type: Serous Exudate Color: amber Foul Odor After Cleansing: No Slough/Fibrino Yes Wound Bed Granulation Amount: Large (67-100%) Exposed Structure Granulation Quality: Red Fascia Exposed: No Necrotic Amount: Small (1-33%) Fat Layer (Subcutaneous Tissue) Exposed: Yes Necrotic Quality: Adherent Slough Tendon Exposed: No Muscle Exposed: No Joint Exposed: No Bone Exposed: No Treatment Notes Wound #1 (Malleolus) Wound Laterality: Right, Lateral Cleanser Soap and Water Discharge Instruction: Gently cleanse wound with antibacterial soap, rinse and pat dry prior to dressing wounds [name], [name] ([id number]) Peri-Wound Care Topical Primary Dressing Hydrofera  Blue Ready Transfer Foam, 2.5x2.5 (in/in) Discharge Instruction: Apply Hydrofera Blue Ready to wound bed as directed Secondary Dressing Gauze Discharge Instruction: As directed: dry, moistened with saline or moistened with Dakins Solution Secured With Compression Wrap Compression Stockings Add-Ons Tubigrip D Electronic Signature(s) Signed: 05/05/2021 4:20:45 PM By: [name] Entered By: [name] on 05/05/2021 15:31:51 Cadena, [name] ([id number]) -------------------------------------------------------------------------------- [place] Details Patient Name: [name] Date of Service: 05/05/2021 3:15 PM Medical Record Number: [id number] Patient Account Number: [id number] Date of Birth/Sex: 1927/07/20 (87 y.o. F) Treating RN: [name] Primary Care Athaliah Baumbach: [name] Other Clinician: Referring Markelle Asaro: [name] Treating Leaner Morici/Extender: [name] in Treatment: 28 Vital Signs Time Taken: 15:25 Temperature (F): 98.5 Height (in): 66 Pulse (bpm): 67 Weight (lbs): 76 Respiratory Rate (breaths/min): 16 Body Mass Index (BMI): 12.3 Blood Pressure (mmHg): 167/69 Reference Range: 80 - 120 mg / dl Electronic Signature(s) Signed: 05/05/2021 4:20:45 PM By: [name] Entered ByDonnamarie Downs on 05/05/2021 15:27:58

## 2021-05-05 NOTE — Progress Notes (Addendum)
DIAHANN, GUAJARDO ([id number]) Visit Report for 05/05/2021 Chief Complaint Document Details Patient Name: [name], [name]. Date of Service: 05/05/2021 3:15 PM Medical Record Number: [id number] Patient Account Number: [id number] Date of Birth/Sex: October 26, 1927 (87 y.o. F) Treating RN: Donnamarie Poag Primary Care Provider: Viviana Simpler Other Clinician: Referring Provider: Viviana Simpler Treating Provider/Extender: Skipper Cliche in Treatment: 28 Information Obtained from: Patient Chief Complaint Pressure ulcer right ankle Electronic Signature(s) Signed: 05/05/2021 3:48:16 PM By: Worthy Keeler PA-C Entered By: Worthy Keeler on 05/05/2021 15:48:16 Rice, [name] ([id number]) -------------------------------------------------------------------------------- HPI Details Patient Name: [name], [name] Date of Service: 05/05/2021 3:15 PM Medical Record Number: [id number] Patient Account Number: [id number] Date of Birth/Sex: [birth year] (87 y.o. F) Treating RN: Donnamarie Poag Primary Care Provider: Viviana Simpler Other Clinician: Referring Provider: Viviana Simpler Treating Provider/Extender: Skipper Cliche in Treatment: 28 History of Present Illness HPI Description: 10/21/2020 upon evaluation today patient presents for initial inspection here in our clinic concerning issues that she has been having quite some time in regard to her ankle and since she has been in the hospital with regard to the heel. The ankle in fact has been 5-6 years at least I am told. With that being said the heel ulcer occurred when she was in the hospital in December for hip surgery when she fractured her hip. She also was in the hospital Tuesday for altered mental status. Really there was nothing that was identified as the cause for this. She did have a. Fortunately there does not appear to be any signs of infection she tells me that she has had she believes arterial studies At [facility] clinic I could not find Those  studies at this point. Nonetheless I will continue to look and see what I can find before Then. The patient also has a skin tear on her arm this occurred more recently when she bumped this at home. The patient does have a history of hypertension, coronary artery disease, and peripheral vascular disease stated. 10/28/2020 I was able to find the patient's chart currently which shows that she did have an arterial study performed in May 2019. This showed that she had a abnormal right toe brachial index and a normal left toe brachial index. She was noncompressible as far as ABIs were concerned. She did appear to have triphasic flow at that time. Unfortunately the wound that is commented on in the report that I printed off and read mentions the same wound on the ankle that we are still dealing with at this point. Unfortunately this has not healed. And its been quite sometime about 3 years now. Fortunately there does not appear to be any signs of active infection systemically at this point. I think that the patient has done well with the Santyl over the past week which is good news. Patient's caregiver which is her daughter-in-law is concerned about the fact that she really does not feel qualified to be able to change the dressings and take care of this issue. There does not appear to be any signs of anything untoward going on at this point. I think she is done a great job applying the [employer] and I think that has done a great job for the patient is for soften up some of the necrotic tissue. With that being said I think the Xeroform on the arm also has done excellent. In general I am very pleased with where we stand. And I told the patient's daughter-in- law as well that I also feel  like she has done a great job over the past week taking care of her mother. 11/11/2020 upon evaluation today patient appears to be doing well with regard to her wounds. She has been tolerating the dressing changes without  complication her daughters been applying the Santyl which has done a great job. Ho with that being said I think now that we have good arterial study showing we can go ahead and proceed with sharp debridement at this point. 11/25/2020 upon evaluation today patient appears to be doing well with regard to her wounds. The Santyl has really helped to clean things up and overall she is doing quite excellent at this point. Fortunately there does not appear to be any signs of infection which is great news and in general extremely pleased. I do believe some debridement is in order and hopefully will be able to get her into a collagen dressing after this. 12/23/2020 upon evaluation today patient appears to be doing well with regard to her wound all things considered. Unfortunately it does sound like her daughter decided to let this air out because she felt like it was getting so wet. It sounds like she got border gauze dressings instead of border foam therefore there is really Nothing to catch the excess drainage which I think is the problem they were worried about the smell. Fortunately there does not appear to be any signs of active infection which is great news. Nonetheless I do not think we want to leave this just open to air. 01/13/2021 upon evaluation today patient's wounds actually appear to be about as good as have seen since have been taking care of her. Fortunately there does not appear to be any evidence of infection which is great and overall the biggest issue I see is that of fluid buildup. I think we need to do something to try to help manage this. I think if we can control her edema we get the wounds to heal more effectively. 01/27/2021 upon evaluation today patient appears to be doing well in regard to the wounds on her right lateral malleolus as well as the left heel and the right ischial tuberosity is unfortunately a new area that has arisen since we last saw her. She tells me currently that that is  from sitting too much. With that being said this actually appears to be doing the worst of anything so far that I see today. Fortunately there does not appear to be any signs of infection this is at least good news. Compression wrap seem to be doing excellent for her as far as the legs are concerned. 02/03/2021 upon evaluation today patient appears to be doing well with regard to all of her wounds. She has been tolerating the dressing changes without complication. Fortunately there is no signs of active infection at this time. No fevers, chills, nausea, vomiting, or diarrhea. 02/10/2021 upon evaluation today patient appears to be doing well with regard to her wounds. With that being said there is some slight evidence of hypergranulation in regard to the right ankle in particular and a little bit in regard to the left heel. I think Hydrofera Blue might be a better option to go to at this point based on what I am seeing especially since both of these wounds in particular seem to be a little bit stalled. I discussed that with the patient and her daughter today. With regard to the hip this is doing great with the collagen I think will get very close to  complete closure I recommend we continue with the collagen. 02/17/2021 upon evaluation today patient appears to be doing excellent in regard to her heel ulcers. She has been tolerating the dressing changes without complication and overall I am extremely pleased with where things stand today. There does not appear to be any signs of active infection which is great news. Overall I may think that we are headed in the proper direction based on what I am seeing. 02/24/2021 upon evaluation today patient's wounds actually are showing signs of improvement in regard to her heels both are doing awesome. In regard to her hip location this is actually reopened after being closed last week and to be perfectly honest I am more concerned here about the fact that pressures  causing this issue I do not think it has anything to do with her not having the collagen in place again it was completely healed last week there was no reason for the collagen to be there. 03/03/2021 upon evaluation today patient appears to be doing well with regard to her wounds. Everything is showing signs of improvement and [name], [name]. ([id number]) doing some much better as far as the overall size of the wound. Fortunately I think we are headed in the right direction. 03/10/2021 upon evaluation today patient appears to be doing well with regard to her wound. She is tolerating the dressing changes without complication. The left heel pretty much appears to be almost completely healed although I think it is probably can be 1 more week before I can call this 100% slough. The right ankle is significantly improved with that being said its not completely closed. With regard to the right hip this is completely closed. Overall I am very pleased with where things stand I think were getting very close to complete resolution. I discussed all this with the patient and her daughter-in-law today. 03/17/2021 upon evaluation today patient appears to be doing well with regard to her wounds. Fortunately there does not appear to be any signs of active infection which is great news and overall very pleased with where the patient stands today. I am in general thankful that overall she is making great progress here. There is good to be a little bit of debridement here to clear away some of the necrotic debris today on both wound locations. 03/24/2021 upon evaluation today patient appears to be doing excellent in regard to her wound. She has been tolerating the dressing changes without complication. Fortunately there does not appear to be any evidence of infection the left foot is completely healed this is great news. On the right at the ankle region this is still open though I do not think the alginate did quite as well  as I was hoping as far as getting this dried out. I think that we may just want to switch back to the [facility] which has done well up to this point. I was just hopeful this would wrap things up completely for [place]. 03/31/2021 upon evaluation today patient appears to be doing well with regard to her wound. This is on the right side which appears to be doing better than last week I think is definitely measuring smaller and this is great news. Overall I am very pleased with where we stand today. I do believe the [facility] is doing better for her. 04/14/2021 upon evaluation today patient appears to be doing well with regard to her wound. I am very pleased with the way it stands I think  that she is headed in the right direction. Unfortunately she is having issues with the Tubigrip neither she nor her daughter-in-law who is the primary caregiver can get this on. Subsequently that means that they have been having a very difficult time with complying with the necessary things for this left leg. With that being said I am really not sure what to do to help in that regard. We can always try a bigger that is larger size Tubigrip but at the same time that means that she may not get as good compression and it may not keep things under control the only way to know is to try. 04/21/2021 upon evaluation today patient appears to be doing well in regard to her wound. She has been tolerating the dressing changes without complication and in fact the right ankle ulcer is actually showing signs of excellent improvement I am very pleased with where things stand today. No fevers, chills, nausea, vomiting, or diarrhea. 04/28/2021 upon evaluation today patient appears to be doing well with regard to her ankle ulcer. This is actually showing signs of good improvement which is great news and overall very pleased with where we stand today. No fevers, chills, nausea, vomiting, or diarrhea. 05/05/2021 upon evaluation today  patient's wound is actually showing signs of significant improvement and overall I am extremely pleased with where we stand today. There does not appear to be any signs of active infection which is great news and overall I am extremely pleased with where we stand at this point. No fevers, chills, nausea, vomiting, or diarrhea. Electronic Signature(s) Signed: 05/05/2021 6:16:31 PM By: Worthy Keeler PA-C Entered By: Worthy Keeler on 05/05/2021 18:16:31 Tomlin, [name] ([id number]) -------------------------------------------------------------------------------- Physical Exam Details Patient Name: [name], [name] Date of Service: 05/05/2021 3:15 PM Medical Record Number: [id number] Patient Account Number: [id number] Date of Birth/Sex: 08-26-1927 (87 y.o. F) Treating RN: Donnamarie Poag Primary Care Provider: Viviana Simpler Other Clinician: Referring Provider: Viviana Simpler Treating Provider/Extender: Skipper Cliche in Treatment: 69 Constitutional Well-nourished and well-hydrated in no acute distress. Respiratory normal breathing without difficulty. Psychiatric this patient is able to make decisions and demonstrates good insight into disease process. Alert and Oriented x 3. pleasant and cooperative. Notes Patient's wound bed actually seems to be doing well and overall think she is doing very well with the Tubigrip at this point I would recommend we continue as such. Also think the [facility] is doing a good job for her. Electronic Signature(s) Signed: 05/05/2021 6:17:03 PM By: Worthy Keeler PA-C Entered By: Worthy Keeler on 05/05/2021 18:17:02 Kutzer, [name] ([id number]) -------------------------------------------------------------------------------- Physician Orders Details Patient Name: [name], [name] Date of Service: 05/05/2021 3:15 PM Medical Record Number: [id number] Patient Account Number: [id number] Date of Birth/Sex: 06/18/1928 (87 y.o. F) Treating RN: Donnamarie Poag Primary Care Provider: Viviana Simpler Other Clinician: Referring Provider: Viviana Simpler Treating Provider/Extender: Skipper Cliche in Treatment: 31 Verbal / Phone Orders: No Diagnosis Coding ICD-10 Coding Code Description L89.513 Pressure ulcer of right ankle, stage 3 I10 Essential (primary) hypertension I25.10 Atherosclerotic heart disease of native coronary artery without angina pectoris I73.89 Other specified peripheral vascular diseases Follow-up Appointments o Return Appointment in 1 week. o Nurse Visit as needed Bathing/ Shower/ Hygiene o May shower with wound dressing protected with water repellent cover or cast protector. o No tub bath. Edema Control - Lymphedema / Segmental Compressive Device / Other Bilateral Lower Extremities o Optional: One layer of unna paste to top of compression wrap (  to act as an anchor). o Tubigrip single layer applied. - Left leg-tubi grip D, cut about 50 cm long o Elevate legs to the level of the heart and pump ankles as often as possible o Elevate leg(s) parallel to the floor when sitting. o DO YOUR BEST to sleep in the bed at night. DO NOT sleep in your recliner. Long hours of sitting in a recliner leads to swelling of the legs and/or potential wounds on your backside. Additional Orders / Instructions o Follow Nutritious Diet and Increase Protein Intake Wound Treatment Wound #1 - Malleolus Wound Laterality: Right, Lateral Cleanser: Soap and Water 3 x Per Week/30 Days Discharge Instructions: Gently cleanse wound with antibacterial soap, rinse and pat dry prior to dressing wounds Primary Dressing: Hydrofera Blue Ready Transfer Foam, 2.5x2.5 (in/in) 3 x Per Week/30 Days Discharge Instructions: Apply Hydrofera Blue Ready to wound bed as directed Secondary Dressing: Gauze 3 x Per Week/30 Days Discharge Instructions: As directed: dry, moistened with saline or moistened with Dakins Solution Add-Ons: Tubigrip D 3 x Per  Week/30 Days Electronic Signature(s) Signed: 05/05/2021 4:20:45 PM By: Donnamarie Poag Signed: 05/05/2021 7:05:42 PM By: Worthy Keeler PA-C Entered By: Donnamarie Poag on 05/05/2021 15:51:45 Zwahlen, [name] ([id number]) -------------------------------------------------------------------------------- Problem List Details Patient Name: [name], [name] Date of Service: 05/05/2021 3:15 PM Medical Record Number: [id number] Patient Account Number: [id number] Date of Birth/Sex: Apr 16, 1928 (87 y.o. F) Treating RN: Donnamarie Poag Primary Care Provider: Viviana Simpler Other Clinician: Referring Provider: Viviana Simpler Treating Provider/Extender: Skipper Cliche in Treatment: 28 Active Problems ICD-10 Encounter Code Description Active Date MDM Diagnosis L89.513 Pressure ulcer of right ankle, stage 3 10/21/2020 No Yes I10 Essential (primary) hypertension 10/21/2020 No Yes I25.10 Atherosclerotic heart disease of native coronary artery without angina 10/21/2020 No Yes pectoris I73.89 Other specified peripheral vascular diseases 10/21/2020 No Yes Inactive Problems Resolved Problems ICD-10 Code Description Active Date Resolved Date L89.623 Pressure ulcer of left heel, stage 3 10/21/2020 10/21/2020 S51.802A Unspecified open wound of left forearm, initial encounter 10/21/2020 10/21/2020 L89.312 Pressure ulcer of right buttock, stage 2 01/27/2021 01/27/2021 Electronic Signature(s) Signed: 05/05/2021 3:48:11 PM By: Worthy Keeler PA-C Entered By: Worthy Keeler on 05/05/2021 15:48:10 Epler, [name] ([id number]) -------------------------------------------------------------------------------- Progress Note Details Patient Name: [name] Date of Service: 05/05/2021 3:15 PM Medical Record Number: [id number] Patient Account Number: [id number] Date of Birth/Sex: 09/11/1927 (87 y.o. F) Treating RN: Donnamarie Poag Primary Care Provider: Viviana Simpler Other Clinician: Referring Provider: Viviana Simpler Treating Provider/Extender: Skipper Cliche in Treatment: 28 Subjective Chief Complaint Information obtained from Patient Pressure ulcer right ankle History of Present Illness (HPI) 10/21/2020 upon evaluation today patient presents for initial inspection here in our clinic concerning issues that she has been having quite some time in regard to her ankle and since she has been in the hospital with regard to the heel. The ankle in fact has been 5-6 years at least I am told. With that being said the heel ulcer occurred when she was in the hospital in December for hip surgery when she fractured her hip. She also was in the hospital Tuesday for altered mental status. Really there was nothing that was identified as the cause for this. She did have a. Fortunately there does not appear to be any signs of infection she tells me that she has had she believes arterial studies At [facility] clinic I could not find Those studies at this point. Nonetheless I will continue to look and see what I  can find before Then. The patient also has a skin tear on her arm this occurred more recently when she bumped this at home. The patient does have a history of hypertension, coronary artery disease, and peripheral vascular disease stated. 10/28/2020 I was able to find the patient's chart currently which shows that she did have an arterial study performed in May 2019. This showed that she had a abnormal right toe brachial index and a normal left toe brachial index. She was noncompressible as far as ABIs were concerned. She did appear to have triphasic flow at that time. Unfortunately the wound that is commented on in the report that I printed off and read mentions the same wound on the ankle that we are still dealing with at this point. Unfortunately this has not healed. And its been quite sometime about 3 years now. Fortunately there does not appear to be any signs of active infection systemically at this point. I  think that the patient has done well with the Santyl over the past week which is good news. Patient's caregiver which is her daughter-in-law is concerned about the fact that she really does not feel qualified to be able to change the dressings and take care of this issue. There does not appear to be any signs of anything untoward going on at this point. I think she is done a great job applying the [employer] and I think that has done a great job for the patient is for soften up some of the necrotic tissue. With that being said I think the Xeroform on the arm also has done excellent. In general I am very pleased with where we stand. And I told the patient's daughter-in- law as well that I also feel like she has done a great job over the past week taking care of her mother. 11/11/2020 upon evaluation today patient appears to be doing well with regard to her wounds. She has been tolerating the dressing changes without complication her daughters been applying the Santyl which has done a great job. Ho with that being said I think now that we have good arterial study showing we can go ahead and proceed with sharp debridement at this point. 11/25/2020 upon evaluation today patient appears to be doing well with regard to her wounds. The Santyl has really helped to clean things up and overall she is doing quite excellent at this point. Fortunately there does not appear to be any signs of infection which is great news and in general extremely pleased. I do believe some debridement is in order and hopefully will be able to get her into a collagen dressing after this. 12/23/2020 upon evaluation today patient appears to be doing well with regard to her wound all things considered. Unfortunately it does sound like her daughter decided to let this air out because she felt like it was getting so wet. It sounds like she got border gauze dressings instead of border foam therefore there is really Nothing to catch the excess  drainage which I think is the problem they were worried about the smell. Fortunately there does not appear to be any signs of active infection which is great news. Nonetheless I do not think we want to leave this just open to air. 01/13/2021 upon evaluation today patient's wounds actually appear to be about as good as have seen since have been taking care of her. Fortunately there does not appear to be any evidence of infection which is great and overall the biggest  issue I see is that of fluid buildup. I think we need to do something to try to help manage this. I think if we can control her edema we get the wounds to heal more effectively. 01/27/2021 upon evaluation today patient appears to be doing well in regard to the wounds on her right lateral malleolus as well as the left heel and the right ischial tuberosity is unfortunately a new area that has arisen since we last saw her. She tells me currently that that is from sitting too much. With that being said this actually appears to be doing the worst of anything so far that I see today. Fortunately there does not appear to be any signs of infection this is at least good news. Compression wrap seem to be doing excellent for her as far as the legs are concerned. 02/03/2021 upon evaluation today patient appears to be doing well with regard to all of her wounds. She has been tolerating the dressing changes without complication. Fortunately there is no signs of active infection at this time. No fevers, chills, nausea, vomiting, or diarrhea. 02/10/2021 upon evaluation today patient appears to be doing well with regard to her wounds. With that being said there is some slight evidence of hypergranulation in regard to the right ankle in particular and a little bit in regard to the left heel. I think Hydrofera Blue might be a better option to go to at this point based on what I am seeing especially since both of these wounds in particular seem to be a little bit  stalled. I discussed that with the patient and her daughter today. With regard to the hip this is doing great with the collagen I think will get very close to complete closure I recommend we continue with the collagen. 02/17/2021 upon evaluation today patient appears to be doing excellent in regard to her heel ulcers. She has been tolerating the dressing changes without complication and overall I am extremely pleased with where things stand today. There does not appear to be any signs of active infection which is great news. Overall I may think that we are headed in the proper direction based on what I am seeing. 02/24/2021 upon evaluation today patient's wounds actually are showing signs of improvement in regard to her heels both are doing awesome. In [place], [place] ([id number]) regard to her hip location this is actually reopened after being closed last week and to be perfectly honest I am more concerned here about the fact that pressures causing this issue I do not think it has anything to do with her not having the collagen in place again it was completely healed last week there was no reason for the collagen to be there. 03/03/2021 upon evaluation today patient appears to be doing well with regard to her wounds. Everything is showing signs of improvement and doing some much better as far as the overall size of the wound. Fortunately I think we are headed in the right direction. 03/10/2021 upon evaluation today patient appears to be doing well with regard to her wound. She is tolerating the dressing changes without complication. The left heel pretty much appears to be almost completely healed although I think it is probably can be 1 more week before I can call this 100% slough. The right ankle is significantly improved with that being said its not completely closed. With regard to the right hip this is completely closed. Overall I am very pleased with where things stand I  think were getting very  close to complete resolution. I discussed all this with the patient and her daughter-in-law today. 03/17/2021 upon evaluation today patient appears to be doing well with regard to her wounds. Fortunately there does not appear to be any signs of active infection which is great news and overall very pleased with where the patient stands today. I am in general thankful that overall she is making great progress here. There is good to be a little bit of debridement here to clear away some of the necrotic debris today on both wound locations. 03/24/2021 upon evaluation today patient appears to be doing excellent in regard to her wound. She has been tolerating the dressing changes without complication. Fortunately there does not appear to be any evidence of infection the left foot is completely healed this is great news. On the right at the ankle region this is still open though I do not think the alginate did quite as well as I was hoping as far as getting this dried out. I think that we may just want to switch back to the [facility] which has done well up to this point. I was just hopeful this would wrap things up completely for [place]. 03/31/2021 upon evaluation today patient appears to be doing well with regard to her wound. This is on the right side which appears to be doing better than last week I think is definitely measuring smaller and this is great news. Overall I am very pleased with where we stand today. I do believe the [facility] is doing better for her. 04/14/2021 upon evaluation today patient appears to be doing well with regard to her wound. I am very pleased with the way it stands I think that she is headed in the right direction. Unfortunately she is having issues with the Tubigrip neither she nor her daughter-in-law who is the primary caregiver can get this on. Subsequently that means that they have been having a very difficult time with complying with the necessary things for this left  leg. With that being said I am really not sure what to do to help in that regard. We can always try a bigger that is larger size Tubigrip but at the same time that means that she may not get as good compression and it may not keep things under control the only way to know is to try. 04/21/2021 upon evaluation today patient appears to be doing well in regard to her wound. She has been tolerating the dressing changes without complication and in fact the right ankle ulcer is actually showing signs of excellent improvement I am very pleased with where things stand today. No fevers, chills, nausea, vomiting, or diarrhea. 04/28/2021 upon evaluation today patient appears to be doing well with regard to her ankle ulcer. This is actually showing signs of good improvement which is great news and overall very pleased with where we stand today. No fevers, chills, nausea, vomiting, or diarrhea. 05/05/2021 upon evaluation today patient's wound is actually showing signs of significant improvement and overall I am extremely pleased with where we stand today. There does not appear to be any signs of active infection which is great news and overall I am extremely pleased with where we stand at this point. No fevers, chills, nausea, vomiting, or diarrhea. Objective Constitutional Well-nourished and well-hydrated in no acute distress. Vitals Time Taken: 3:25 PM, Height: 66 in, Weight: 76 lbs, BMI: 12.3, Temperature: 98.5 F, Pulse: 67 bpm, Respiratory Rate: 16 breaths/min, Blood  Pressure: 167/69 mmHg. Respiratory normal breathing without difficulty. Psychiatric this patient is able to make decisions and demonstrates good insight into disease process. Alert and Oriented x 3. pleasant and cooperative. General Notes: Patient's wound bed actually seems to be doing well and overall think she is doing very well with the Tubigrip at this point I would recommend we continue as such. Also think the [facility] is doing a  good job for her. Integumentary (Hair, Skin) Wound #1 status is Open. Original cause of wound was Gradually Appeared. The date acquired was: 10/22/2014. The wound has been in treatment 28 weeks. The wound is located on the Right,Lateral Malleolus. The wound measures 0.4cm length x 0.6cm width x 0.1cm depth; 0.188cm^2 area and 0.019cm^3 volume. There is Fat Layer (Subcutaneous Tissue) exposed. There is no tunneling or undermining noted. There is a medium amount of serous drainage noted. The wound margin is flat and intact. There is large (67-100%) red granulation within the wound bed. There is a NALIYAH, NETH ([id number]) small (1-33%) amount of necrotic tissue within the wound bed including Adherent Slough. Assessment Active Problems ICD-10 Pressure ulcer of right ankle, stage 3 Essential (primary) hypertension Atherosclerotic heart disease of native coronary artery without angina pectoris Other specified peripheral vascular diseases Plan Follow-up Appointments: Return Appointment in 1 week. Nurse Visit as needed Bathing/ Shower/ Hygiene: May shower with wound dressing protected with water repellent cover or cast protector. No tub bath. Edema Control - Lymphedema / Segmental Compressive Device / Other: Optional: One layer of unna paste to top of compression wrap (to act as an anchor). Tubigrip single layer applied. - Left leg-tubi grip D, cut about 50 cm long Elevate legs to the level of the heart and pump ankles as often as possible Elevate leg(s) parallel to the floor when sitting. DO YOUR BEST to sleep in the bed at night. DO NOT sleep in your recliner. Long hours of sitting in a recliner leads to swelling of the legs and/or potential wounds on your backside. Additional Orders / Instructions: Follow Nutritious Diet and Increase Protein Intake WOUND #1: - Malleolus Wound Laterality: Right, Lateral Cleanser: Soap and Water 3 x Per Week/30 Days Discharge Instructions: Gently  cleanse wound with antibacterial soap, rinse and pat dry prior to dressing wounds Primary Dressing: Hydrofera Blue Ready Transfer Foam, 2.5x2.5 (in/in) 3 x Per Week/30 Days Discharge Instructions: Apply Hydrofera Blue Ready to wound bed as directed Secondary Dressing: Gauze 3 x Per Week/30 Days Discharge Instructions: As directed: dry, moistened with saline or moistened with Dakins Solution Add-Ons: Tubigrip D 3 x Per Week/30 Days 1. Would recommend currently that we going continue with the [facility] as the dressing of choice that seems to be doing well. 2. I am also going to recommend that we continue with the Tubigrip which I think is doing a good job as well for her. 3. I would also suggest she continue to elevate her legs is much as she can when she is at rest but overall I feel like we are doing quite well. We will see patient back for reevaluation in 1 week here in the clinic. If anything worsens or changes patient will contact our office for additional recommendations. Electronic Signature(s) Signed: 05/05/2021 6:17:37 PM By: Worthy Keeler PA-C Entered By: Worthy Keeler on 05/05/2021 18:17:37 [name], [name] ([id number]) -------------------------------------------------------------------------------- SuperBill Details Patient Name: [name], [name] Date of Service: 05/05/2021 Medical Record Number: [id number] Patient Account Number: [id number] Date of Birth/Sex: 06-Jan-1928 (87 y.o.  F) Treating RN: Donnamarie Poag Primary Care Provider: Viviana Simpler Other Clinician: Referring Provider: Viviana Simpler Treating Provider/Extender: Skipper Cliche in Treatment: 28 Diagnosis Coding ICD-10 Codes Code Description [phone] Pressure ulcer of right ankle, stage 3 I10 Essential (primary) hypertension I25.10 Atherosclerotic heart disease of native coronary artery without angina pectoris I73.89 Other specified peripheral vascular diseases Facility Procedures CPT4 Code:  [id number] Description: [id number] - WOUND CARE VISIT-LEV 2 EST PT Modifier: Quantity: 1 Physician Procedures CPT4 Code: [id number] Description: 99213 - WC PHYS LEVEL 3 - EST PT Modifier: Quantity: 1 CPT4 Code: Description: ICD-10 Diagnosis Description L89.513 Pressure ulcer of right ankle, stage 3 I10 Essential (primary) hypertension I25.10 Atherosclerotic heart disease of native coronary artery without angina I73.89 Other specified peripheral vascular  diseases Modifier: pectoris Quantity: Electronic Signature(s) Signed: 05/05/2021 6:21:28 PM By: Worthy Keeler PA-C Previous Signature: 05/05/2021 4:20:45 PM Version By: Donnamarie Poag Entered By: Worthy Keeler on 05/05/2021 18:21:28

## 2021-05-08 ENCOUNTER — Other Ambulatory Visit: Payer: Self-pay | Admitting: Internal Medicine

## 2021-05-08 DIAGNOSIS — H16231 Neurotrophic keratoconjunctivitis, right eye: Secondary | ICD-10-CM | POA: Diagnosis not present

## 2021-05-09 NOTE — Telephone Encounter (Signed)
Note Name of Medication: Morphine Sulfate Name of Pharmacy: [facility] or Written Date and Quantity: 04-10-21 #90 Last Office Visit and Type: 01-16-21-22 Next Office Visit and Type: 06-05-21 Last Controlled Substance Agreement Date: 09-29-20 Last UDS: 04-27-19

## 2021-05-12 ENCOUNTER — Other Ambulatory Visit: Payer: Self-pay

## 2021-05-12 ENCOUNTER — Encounter: Payer: PPO | Attending: Physician Assistant | Admitting: Physician Assistant

## 2021-05-12 DIAGNOSIS — I10 Essential (primary) hypertension: Secondary | ICD-10-CM | POA: Diagnosis not present

## 2021-05-12 DIAGNOSIS — I2510 Atherosclerotic heart disease of native coronary artery without angina pectoris: Secondary | ICD-10-CM | POA: Diagnosis not present

## 2021-05-12 DIAGNOSIS — I7389 Other specified peripheral vascular diseases: Secondary | ICD-10-CM | POA: Insufficient documentation

## 2021-05-12 DIAGNOSIS — L89513 Pressure ulcer of right ankle, stage 3: Secondary | ICD-10-CM | POA: Diagnosis not present

## 2021-05-12 NOTE — Progress Notes (Addendum)
[name], [name] ([id number]) Visit Report for 05/12/2021 Chief Complaint Document Details Patient Name: [name], [name]. Date of Service: 05/12/2021 3:15 PM Medical Record Number: [id number] Patient Account Number: [id number] Date of Birth/Sex: 21-Oct-1927 (87 y.o. F) Treating RN: Donnamarie Poag Primary Care Provider: Viviana Simpler Other Clinician: Referring Provider: Viviana Simpler Treating Provider/Extender: Skipper Cliche in Treatment: 29 Information Obtained from: Patient Chief Complaint Pressure ulcer right ankle Electronic Signature(s) Signed: 05/12/2021 3:14:36 PM By: Worthy Keeler PA-C Entered By: Worthy Keeler on 05/12/2021 15:14:36 Ryce, [name] ([id number]) -------------------------------------------------------------------------------- HPI Details Patient Name: [name], [name] Date of Service: 05/12/2021 3:15 PM Medical Record Number: [id number] Patient Account Number: [id number] Date of Birth/Sex: 1927-12-30 (87 y.o. F) Treating RN: Donnamarie Poag Primary Care Provider: Viviana Simpler Other Clinician: Referring Provider: Viviana Simpler Treating Provider/Extender: Skipper Cliche in Treatment: 29 History of Present Illness HPI Description: 10/21/2020 upon evaluation today patient presents for initial inspection here in our clinic concerning issues that she has been having quite some time in regard to her ankle and since she has been in the hospital with regard to the heel. The ankle in fact has been 5-6 years at least I am told. With that being said the heel ulcer occurred when she was in the hospital in December for hip surgery when she fractured her hip. She also was in the hospital Tuesday for altered mental status. Really there was nothing that was identified as the cause for this. She did have a. Fortunately there does not appear to be any signs of infection she tells me that she has had she believes arterial studies At [name] clinic I could not find Those  studies at this point. Nonetheless I will continue to look and see what I can find before Then. The patient also has a skin tear on her arm this occurred more recently when she bumped this at home. The patient does have a history of hypertension, coronary artery disease, and peripheral vascular disease stated. 10/28/2020 I was able to find the patient's chart currently which shows that she did have an arterial study performed in May 2019. This showed that she had a abnormal right toe brachial index and a normal left toe brachial index. She was noncompressible as far as ABIs were concerned. She did appear to have triphasic flow at that time. Unfortunately the wound that is commented on in the report that I printed off and read mentions the same wound on the ankle that we are still dealing with at this point. Unfortunately this has not healed. And its been quite sometime about 3 years now. Fortunately there does not appear to be any signs of active infection systemically at this point. I think that the patient has done well with the Santyl over the past week which is good news. Patient's caregiver which is her daughter-in-law is concerned about the fact that she really does not feel qualified to be able to change the dressings and take care of this issue. There does not appear to be any signs of anything untoward going on at this point. I think she is done a great job applying the [employer] and I think that has done a great job for the patient is for soften up some of the necrotic tissue. With that being said I think the Xeroform on the arm also has done excellent. In general I am very pleased with where we stand. And I told the patient's daughter-in- law as well that I also feel  like she has done a great job over the past week taking care of her mother. 11/11/2020 upon evaluation today patient appears to be doing well with regard to her wounds. She has been tolerating the dressing changes without  complication her daughters been applying the Santyl which has done a great job. Ho with that being said I think now that we have good arterial study showing we can go ahead and proceed with sharp debridement at this point. 11/25/2020 upon evaluation today patient appears to be doing well with regard to her wounds. The Santyl has really helped to clean things up and overall she is doing quite excellent at this point. Fortunately there does not appear to be any signs of infection which is great news and in general extremely pleased. I do believe some debridement is in order and hopefully will be able to get her into a collagen dressing after this. 12/23/2020 upon evaluation today patient appears to be doing well with regard to her wound all things considered. Unfortunately it does sound like her daughter decided to let this air out because she felt like it was getting so wet. It sounds like she got border gauze dressings instead of border foam therefore there is really Nothing to catch the excess drainage which I think is the problem they were worried about the smell. Fortunately there does not appear to be any signs of active infection which is great news. Nonetheless I do not think we want to leave this just open to air. 01/13/2021 upon evaluation today patient's wounds actually appear to be about as good as have seen since have been taking care of her. Fortunately there does not appear to be any evidence of infection which is great and overall the biggest issue I see is that of fluid buildup. I think we need to do something to try to help manage this. I think if we can control her edema we get the wounds to heal more effectively. 01/27/2021 upon evaluation today patient appears to be doing well in regard to the wounds on her right lateral malleolus as well as the left heel and the right ischial tuberosity is unfortunately a new area that has arisen since we last saw her. She tells me currently that that is  from sitting too much. With that being said this actually appears to be doing the worst of anything so far that I see today. Fortunately there does not appear to be any signs of infection this is at least good news. Compression wrap seem to be doing excellent for her as far as the legs are concerned. 02/03/2021 upon evaluation today patient appears to be doing well with regard to all of her wounds. She has been tolerating the dressing changes without complication. Fortunately there is no signs of active infection at this time. No fevers, chills, nausea, vomiting, or diarrhea. 02/10/2021 upon evaluation today patient appears to be doing well with regard to her wounds. With that being said there is some slight evidence of hypergranulation in regard to the right ankle in particular and a little bit in regard to the left heel. I think Hydrofera Blue might be a better option to go to at this point based on what I am seeing especially since both of these wounds in particular seem to be a little bit stalled. I discussed that with the patient and her daughter today. With regard to the hip this is doing great with the collagen I think will get very close to  complete closure I recommend we continue with the collagen. 02/17/2021 upon evaluation today patient appears to be doing excellent in regard to her heel ulcers. She has been tolerating the dressing changes without complication and overall I am extremely pleased with where things stand today. There does not appear to be any signs of active infection which is great news. Overall I may think that we are headed in the proper direction based on what I am seeing. 02/24/2021 upon evaluation today patient's wounds actually are showing signs of improvement in regard to her heels both are doing awesome. In regard to her hip location this is actually reopened after being closed last week and to be perfectly honest I am more concerned here about the fact that pressures  causing this issue I do not think it has anything to do with her not having the collagen in place again it was completely healed last week there was no reason for the collagen to be there. 03/03/2021 upon evaluation today patient appears to be doing well with regard to her wounds. Everything is showing signs of improvement and [name], [name]. ([id number]) doing some much better as far as the overall size of the wound. Fortunately I think we are headed in the right direction. 03/10/2021 upon evaluation today patient appears to be doing well with regard to her wound. She is tolerating the dressing changes without complication. The left heel pretty much appears to be almost completely healed although I think it is probably can be 1 more week before I can call this 100% slough. The right ankle is significantly improved with that being said its not completely closed. With regard to the right hip this is completely closed. Overall I am very pleased with where things stand I think were getting very close to complete resolution. I discussed all this with the patient and her daughter-in-law today. 03/17/2021 upon evaluation today patient appears to be doing well with regard to her wounds. Fortunately there does not appear to be any signs of active infection which is great news and overall very pleased with where the patient stands today. I am in general thankful that overall she is making great progress here. There is good to be a little bit of debridement here to clear away some of the necrotic debris today on both wound locations. 03/24/2021 upon evaluation today patient appears to be doing excellent in regard to her wound. She has been tolerating the dressing changes without complication. Fortunately there does not appear to be any evidence of infection the left foot is completely healed this is great news. On the right at the ankle region this is still open though I do not think the alginate did quite as well  as I was hoping as far as getting this dried out. I think that we may just want to switch back to the [facility] which has done well up to this point. I was just hopeful this would wrap things up completely for [place]. 03/31/2021 upon evaluation today patient appears to be doing well with regard to her wound. This is on the right side which appears to be doing better than last week I think is definitely measuring smaller and this is great news. Overall I am very pleased with where we stand today. I do believe the [facility] is doing better for her. 04/14/2021 upon evaluation today patient appears to be doing well with regard to her wound. I am very pleased with the way it stands I think  that she is headed in the right direction. Unfortunately she is having issues with the Tubigrip neither she nor her daughter-in-law who is the primary caregiver can get this on. Subsequently that means that they have been having a very difficult time with complying with the necessary things for this left leg. With that being said I am really not sure what to do to help in that regard. We can always try a bigger that is larger size Tubigrip but at the same time that means that she may not get as good compression and it may not keep things under control the only way to know is to try. 04/21/2021 upon evaluation today patient appears to be doing well in regard to her wound. She has been tolerating the dressing changes without complication and in fact the right ankle ulcer is actually showing signs of excellent improvement I am very pleased with where things stand today. No fevers, chills, nausea, vomiting, or diarrhea. 04/28/2021 upon evaluation today patient appears to be doing well with regard to her ankle ulcer. This is actually showing signs of good improvement which is great news and overall very pleased with where we stand today. No fevers, chills, nausea, vomiting, or diarrhea. 05/05/2021 upon evaluation today  patient's wound is actually showing signs of significant improvement and overall I am extremely pleased with where we stand today. There does not appear to be any signs of active infection which is great news and overall I am extremely pleased with where we stand at this point. No fevers, chills, nausea, vomiting, or diarrhea. 05/12/2021 upon evaluation today patient appears to be doing okay in regard to her wound is measuring a little bit smaller today that is good news. With that being said she still continues to have significant issues here with an open wound on the lateral aspect of her ankle. Fortunately there does not appear to be any signs of active infection systemically which is great news. No fevers, chills, nausea, vomiting, or diarrhea. Electronic Signature(s) Signed: 05/12/2021 4:21:19 PM By: Worthy Keeler PA-C Entered By: Worthy Keeler on 05/12/2021 16:21:18 [name] ([id number]) -------------------------------------------------------------------------------- Physical Exam Details Patient Name: [name], [name] Date of Service: 05/12/2021 3:15 PM Medical Record Number: [id number] Patient Account Number: [id number] Date of Birth/Sex: 1928/02/25 (87 y.o. F) Treating RN: Donnamarie Poag Primary Care Provider: Viviana Simpler Other Clinician: Referring Provider: Viviana Simpler Treating Provider/Extender: Skipper Cliche in Treatment: 74 Constitutional Well-nourished and well-hydrated in no acute distress. Respiratory normal breathing without difficulty. Psychiatric this patient is able to make decisions and demonstrates good insight into disease process. Alert and Oriented x 3. pleasant and cooperative. Notes Upon inspection patient's wound bed actually showed signs of good granulation and epithelization at this point. Fortunately there does not appear to be any evidence of active infection systemically which is great news and overall very pleased with where things stand  today. No fevers, chills, nausea, vomiting, or diarrhea. Electronic Signature(s) Signed: 05/12/2021 4:22:00 PM By: Worthy Keeler PA-C Entered By: Worthy Keeler on 05/12/2021 16:22:00 [name] ([id number]) -------------------------------------------------------------------------------- Physician Orders Details Patient Name: [name], [name] Date of Service: 05/12/2021 3:15 PM Medical Record Number: [id number] Patient Account Number: [id number] Date of Birth/Sex: [birth year] (87 y.o. F) Treating RN: Donnamarie Poag Primary Care Provider: Viviana Simpler Other Clinician: Referring Provider: Viviana Simpler Treating Provider/Extender: Skipper Cliche in Treatment: 44 Verbal / Phone Orders: No Diagnosis Coding ICD-10 Coding Code Description L89.513 Pressure ulcer of right ankle, stage 3  I10 Essential (primary) hypertension I25.10 Atherosclerotic heart disease of native coronary artery without angina pectoris I73.89 Other specified peripheral vascular diseases Follow-up Appointments o Return Appointment in 1 week. o Nurse Visit as needed Bathing/ Shower/ Hygiene o May shower with wound dressing protected with water repellent cover or cast protector. o No tub bath. Edema Control - Lymphedema / Segmental Compressive Device / Other Bilateral Lower Extremities o Tubigrip single layer applied. - Left leg-tubi grip D, cut about 50 cm long o Elevate legs to the level of the heart and pump ankles as often as possible o Elevate leg(s) parallel to the floor when sitting. o DO YOUR BEST to sleep in the bed at night. DO NOT sleep in your recliner. Long hours of sitting in a recliner leads to swelling of the legs and/or potential wounds on your backside. Additional Orders / Instructions o Follow Nutritious Diet and Increase Protein Intake Wound Treatment Wound #1 - Malleolus Wound Laterality: Right, Lateral Cleanser: Soap and Water 1 x Per Week/15 Days Discharge  Instructions: Gently cleanse wound with antibacterial soap, rinse and pat dry prior to dressing wounds Primary Dressing: Hydrofera Blue Ready Transfer Foam, 2.5x2.5 (in/in) 1 x Per Week/15 Days Discharge Instructions: Apply Hydrofera Blue Ready to wound bed as directed Secondary Dressing: Mepilex Border Flex, 4x4 (in/in) 1 x Per Week/15 Days Discharge Instructions: Apply to wound as directed. Do not cut. Add-Ons: Tubigrip D 1 x Per Week/15 Days Electronic Signature(s) Signed: 05/12/2021 3:44:08 PM By: Donnamarie Poag Signed: 05/12/2021 4:56:27 PM By: Worthy Keeler PA-C Entered By: Donnamarie Poag on 05/12/2021 15:39:49 Fecher, [name] ([id number]) -------------------------------------------------------------------------------- Problem List Details Patient Name: [name], [name] Date of Service: 05/12/2021 3:15 PM Medical Record Number: [id number] Patient Account Number: [id number] Date of Birth/Sex: [birth year] (87 y.o. F) Treating RN: Donnamarie Poag Primary Care Provider: Viviana Simpler Other Clinician: Referring Provider: Viviana Simpler Treating Provider/Extender: Skipper Cliche in Treatment: 29 Active Problems ICD-10 Encounter Code Description Active Date MDM Diagnosis L89.513 Pressure ulcer of right ankle, stage 3 10/21/2020 No Yes I10 Essential (primary) hypertension 10/21/2020 No Yes I25.10 Atherosclerotic heart disease of native coronary artery without angina 10/21/2020 No Yes pectoris I73.89 Other specified peripheral vascular diseases 10/21/2020 No Yes Inactive Problems Resolved Problems ICD-10 Code Description Active Date Resolved Date L89.623 Pressure ulcer of left heel, stage 3 10/21/2020 10/21/2020 S51.802A Unspecified open wound of left forearm, initial encounter 10/21/2020 10/21/2020 L89.312 Pressure ulcer of right buttock, stage 2 01/27/2021 01/27/2021 Electronic Signature(s) Signed: 05/12/2021 3:14:31 PM By: Worthy Keeler PA-C Entered By: Worthy Keeler on 05/12/2021  15:14:31 Mazzola, [name] ([id number]) -------------------------------------------------------------------------------- Progress Note Details Patient Name: [name] Date of Service: 05/12/2021 3:15 PM Medical Record Number: [id number] Patient Account Number: [id number] Date of Birth/Sex: Nov 16, 1927 (87 y.o. F) Treating RN: Donnamarie Poag Primary Care Provider: Viviana Simpler Other Clinician: Referring Provider: Viviana Simpler Treating Provider/Extender: Skipper Cliche in Treatment: 29 Subjective Chief Complaint Information obtained from Patient Pressure ulcer right ankle History of Present Illness (HPI) 10/21/2020 upon evaluation today patient presents for initial inspection here in our clinic concerning issues that she has been having quite some time in regard to her ankle and since she has been in the hospital with regard to the heel. The ankle in fact has been 5-6 years at least I am told. With that being said the heel ulcer occurred when she was in the hospital in December for hip surgery when she fractured her hip. She also was in the hospital Tuesday  for altered mental status. Really there was nothing that was identified as the cause for this. She did have a. Fortunately there does not appear to be any signs of infection she tells me that she has had she believes arterial studies At [facility] clinic I could not find Those studies at this point. Nonetheless I will continue to look and see what I can find before Then. The patient also has a skin tear on her arm this occurred more recently when she bumped this at home. The patient does have a history of hypertension, coronary artery disease, and peripheral vascular disease stated. 10/28/2020 I was able to find the patient's chart currently which shows that she did have an arterial study performed in May 2019. This showed that she had a abnormal right toe brachial index and a normal left toe brachial index. She was noncompressible as  far as ABIs were concerned. She did appear to have triphasic flow at that time. Unfortunately the wound that is commented on in the report that I printed off and read mentions the same wound on the ankle that we are still dealing with at this point. Unfortunately this has not healed. And its been quite sometime about 3 years now. Fortunately there does not appear to be any signs of active infection systemically at this point. I think that the patient has done well with the Santyl over the past week which is good news. Patient's caregiver which is her daughter-in-law is concerned about the fact that she really does not feel qualified to be able to change the dressings and take care of this issue. There does not appear to be any signs of anything untoward going on at this point. I think she is done a great job applying the [employer] and I think that has done a great job for the patient is for soften up some of the necrotic tissue. With that being said I think the Xeroform on the arm also has done excellent. In general I am very pleased with where we stand. And I told the patient's daughter-in- law as well that I also feel like she has done a great job over the past week taking care of her mother. 11/11/2020 upon evaluation today patient appears to be doing well with regard to her wounds. She has been tolerating the dressing changes without complication her daughters been applying the Santyl which has done a great job. Ho with that being said I think now that we have good arterial study showing we can go ahead and proceed with sharp debridement at this point. 11/25/2020 upon evaluation today patient appears to be doing well with regard to her wounds. The Santyl has really helped to clean things up and overall she is doing quite excellent at this point. Fortunately there does not appear to be any signs of infection which is great news and in general extremely pleased. I do believe some debridement is in order  and hopefully will be able to get her into a collagen dressing after this. 12/23/2020 upon evaluation today patient appears to be doing well with regard to her wound all things considered. Unfortunately it does sound like her daughter decided to let this air out because she felt like it was getting so wet. It sounds like she got border gauze dressings instead of border foam therefore there is really Nothing to catch the excess drainage which I think is the problem they were worried about the smell. Fortunately there does not appear to be  any signs of active infection which is great news. Nonetheless I do not think we want to leave this just open to air. 01/13/2021 upon evaluation today patient's wounds actually appear to be about as good as have seen since have been taking care of her. Fortunately there does not appear to be any evidence of infection which is great and overall the biggest issue I see is that of fluid buildup. I think we need to do something to try to help manage this. I think if we can control her edema we get the wounds to heal more effectively. 01/27/2021 upon evaluation today patient appears to be doing well in regard to the wounds on her right lateral malleolus as well as the left heel and the right ischial tuberosity is unfortunately a new area that has arisen since we last saw her. She tells me currently that that is from sitting too much. With that being said this actually appears to be doing the worst of anything so far that I see today. Fortunately there does not appear to be any signs of infection this is at least good news. Compression wrap seem to be doing excellent for her as far as the legs are concerned. 02/03/2021 upon evaluation today patient appears to be doing well with regard to all of her wounds. She has been tolerating the dressing changes without complication. Fortunately there is no signs of active infection at this time. No fevers, chills, nausea, vomiting, or  diarrhea. 02/10/2021 upon evaluation today patient appears to be doing well with regard to her wounds. With that being said there is some slight evidence of hypergranulation in regard to the right ankle in particular and a little bit in regard to the left heel. I think Hydrofera Blue might be a better option to go to at this point based on what I am seeing especially since both of these wounds in particular seem to be a little bit stalled. I discussed that with the patient and her daughter today. With regard to the hip this is doing great with the collagen I think will get very close to complete closure I recommend we continue with the collagen. 02/17/2021 upon evaluation today patient appears to be doing excellent in regard to her heel ulcers. She has been tolerating the dressing changes without complication and overall I am extremely pleased with where things stand today. There does not appear to be any signs of active infection which is great news. Overall I may think that we are headed in the proper direction based on what I am seeing. 02/24/2021 upon evaluation today patient's wounds actually are showing signs of improvement in regard to her heels both are doing awesome. In [place], [place] ([id number]) regard to her hip location this is actually reopened after being closed last week and to be perfectly honest I am more concerned here about the fact that pressures causing this issue I do not think it has anything to do with her not having the collagen in place again it was completely healed last week there was no reason for the collagen to be there. 03/03/2021 upon evaluation today patient appears to be doing well with regard to her wounds. Everything is showing signs of improvement and doing some much better as far as the overall size of the wound. Fortunately I think we are headed in the right direction. 03/10/2021 upon evaluation today patient appears to be doing well with regard to her wound. She is  tolerating the dressing changes  without complication. The left heel pretty much appears to be almost completely healed although I think it is probably can be 1 more week before I can call this 100% slough. The right ankle is significantly improved with that being said its not completely closed. With regard to the right hip this is completely closed. Overall I am very pleased with where things stand I think were getting very close to complete resolution. I discussed all this with the patient and her daughter-in-law today. 03/17/2021 upon evaluation today patient appears to be doing well with regard to her wounds. Fortunately there does not appear to be any signs of active infection which is great news and overall very pleased with where the patient stands today. I am in general thankful that overall she is making great progress here. There is good to be a little bit of debridement here to clear away some of the necrotic debris today on both wound locations. 03/24/2021 upon evaluation today patient appears to be doing excellent in regard to her wound. She has been tolerating the dressing changes without complication. Fortunately there does not appear to be any evidence of infection the left foot is completely healed this is great news. On the right at the ankle region this is still open though I do not think the alginate did quite as well as I was hoping as far as getting this dried out. I think that we may just want to switch back to the [facility] which has done well up to this point. I was just hopeful this would wrap things up completely for [place]. 03/31/2021 upon evaluation today patient appears to be doing well with regard to her wound. This is on the right side which appears to be doing better than last week I think is definitely measuring smaller and this is great news. Overall I am very pleased with where we stand today. I do believe the [facility] is doing better for her. 04/14/2021 upon  evaluation today patient appears to be doing well with regard to her wound. I am very pleased with the way it stands I think that she is headed in the right direction. Unfortunately she is having issues with the Tubigrip neither she nor her daughter-in-law who is the primary caregiver can get this on. Subsequently that means that they have been having a very difficult time with complying with the necessary things for this left leg. With that being said I am really not sure what to do to help in that regard. We can always try a bigger that is larger size Tubigrip but at the same time that means that she may not get as good compression and it may not keep things under control the only way to know is to try. 04/21/2021 upon evaluation today patient appears to be doing well in regard to her wound. She has been tolerating the dressing changes without complication and in fact the right ankle ulcer is actually showing signs of excellent improvement I am very pleased with where things stand today. No fevers, chills, nausea, vomiting, or diarrhea. 04/28/2021 upon evaluation today patient appears to be doing well with regard to her ankle ulcer. This is actually showing signs of good improvement which is great news and overall very pleased with where we stand today. No fevers, chills, nausea, vomiting, or diarrhea. 05/05/2021 upon evaluation today patient's wound is actually showing signs of significant improvement and overall I am extremely pleased with where we stand today. There does not appear  to be any signs of active infection which is great news and overall I am extremely pleased with where we stand at this point. No fevers, chills, nausea, vomiting, or diarrhea. 05/12/2021 upon evaluation today patient appears to be doing okay in regard to her wound is measuring a little bit smaller today that is good news. With that being said she still continues to have significant issues here with an open wound on the  lateral aspect of her ankle. Fortunately there does not appear to be any signs of active infection systemically which is great news. No fevers, chills, nausea, vomiting, or diarrhea. Objective Constitutional Well-nourished and well-hydrated in no acute distress. Vitals Time Taken: 3:05 PM, Height: 66 in, Weight: 76 lbs, BMI: 12.3, Temperature: 98.3 F, Pulse: 55 bpm, Respiratory Rate: 16 breaths/min, Blood Pressure: 135/62 mmHg. Respiratory normal breathing without difficulty. Psychiatric this patient is able to make decisions and demonstrates good insight into disease process. Alert and Oriented x 3. pleasant and cooperative. General Notes: Upon inspection patient's wound bed actually showed signs of good granulation and epithelization at this point. Fortunately there does not appear to be any evidence of active infection systemically which is great news and overall very pleased with where things stand today. No fevers, chills, nausea, vomiting, or diarrhea. Integumentary (Hair, Skin) [name], [name] ([id number]) Wound #1 status is Open. Original cause of wound was Gradually Appeared. The date acquired was: 10/22/2014. The wound has been in treatment 29 weeks. The wound is located on the Right,Lateral Malleolus. The wound measures 0.4cm length x 0.5cm width x 0.1cm depth; 0.157cm^2 area and 0.016cm^3 volume. There is Fat Layer (Subcutaneous Tissue) exposed. There is no tunneling or undermining noted. There is a medium amount of serous drainage noted. The wound margin is flat and intact. There is large (67-100%) red granulation within the wound bed. There is a small (1-33%) amount of necrotic tissue within the wound bed including Adherent Slough. Assessment Active Problems ICD-10 Pressure ulcer of right ankle, stage 3 Essential (primary) hypertension Atherosclerotic heart disease of native coronary artery without angina pectoris Other specified peripheral vascular diseases Plan Follow-up  Appointments: Return Appointment in 1 week. Nurse Visit as needed Bathing/ Shower/ Hygiene: May shower with wound dressing protected with water repellent cover or cast protector. No tub bath. Edema Control - Lymphedema / Segmental Compressive Device / Other: Tubigrip single layer applied. - Left leg-tubi grip D, cut about 50 cm long Elevate legs to the level of the heart and pump ankles as often as possible Elevate leg(s) parallel to the floor when sitting. DO YOUR BEST to sleep in the bed at night. DO NOT sleep in your recliner. Long hours of sitting in a recliner leads to swelling of the legs and/or potential wounds on your backside. Additional Orders / Instructions: Follow Nutritious Diet and Increase Protein Intake WOUND #1: - Malleolus Wound Laterality: Right, Lateral Cleanser: Soap and Water 1 x Per Week/15 Days Discharge Instructions: Gently cleanse wound with antibacterial soap, rinse and pat dry prior to dressing wounds Primary Dressing: Hydrofera Blue Ready Transfer Foam, 2.5x2.5 (in/in) 1 x Per Week/15 Days Discharge Instructions: Apply Hydrofera Blue Ready to wound bed as directed Secondary Dressing: Mepilex Border Flex, 4x4 (in/in) 1 x Per Week/15 Days Discharge Instructions: Apply to wound as directed. Do not cut. Add-Ons: Tubigrip D 1 x Per Week/15 Days 1. I would recommend currently that we going to continue with the wound care measures as before and the patient is in agreement with the plan.  This includes the use of the [facility]. 2. I am also can recommend that we have the patient continue with the border foam dressing to cover followed by the Tubigrip which is doing a good job for her. We will see patient back for reevaluation in 1 week here in the clinic. If anything worsens or changes patient will contact our office for additional recommendations. Electronic Signature(s) Signed: 05/12/2021 4:28:16 PM By: Worthy Keeler PA-C Entered By: Worthy Keeler on  05/12/2021 16:28:15 [name], [name] ([id number]) -------------------------------------------------------------------------------- SuperBill Details Patient Name: [name], [name] Date of Service: 05/12/2021 Medical Record Number: [id number] Patient Account Number: [id number] Date of Birth/Sex: [birth year] (87 y.o. F) Treating RN: Donnamarie Poag Primary Care Provider: Viviana Simpler Other Clinician: Referring Provider: Viviana Simpler Treating Provider/Extender: Skipper Cliche in Treatment: 29 Diagnosis Coding ICD-10 Codes Code Description L89.513 Pressure ulcer of right ankle, stage 3 I10 Essential (primary) hypertension I25.10 Atherosclerotic heart disease of native coronary artery without angina pectoris I73.89 Other specified peripheral vascular diseases Facility Procedures CPT4 Code: 42706237 Description: [phone] - WOUND CARE VISIT-LEV 2 EST PT Modifier: Quantity: 1 Physician Procedures CPT4 Code: [id number] Description: 99214 - WC PHYS LEVEL 4 - EST PT Modifier: Quantity: 1 CPT4 Code: Description: ICD-10 Diagnosis Description L89.513 Pressure ulcer of right ankle, stage 3 I10 Essential (primary) hypertension I25.10 Atherosclerotic heart disease of native coronary artery without angina I73.89 Other specified peripheral vascular  diseases Modifier: pectoris Quantity: Electronic Signature(s) Signed: 05/12/2021 4:39:05 PM By: Worthy Keeler PA-C Previous Signature: 05/12/2021 3:44:08 PM Version By: Donnamarie Poag Entered By: Worthy Keeler on 05/12/2021 16:39:04

## 2021-05-12 NOTE — Progress Notes (Signed)
[name], [name] ([id number]) Visit Report for 05/12/2021 Arrival Information Details Patient Name: [name], [name] Date of Service: 05/12/2021 3:15 PM Medical Record Number: [id number] Patient Account Number: [id number] Date of Birth/Sex: 1927/08/10 (87 y.o. F) Treating RN: Donnamarie Poag Primary Care Cristi Gwynn: Viviana Simpler Other Clinician: Referring Clancy Leiner: Viviana Simpler Treating Tremell Reimers/Extender: Skipper Cliche in Treatment: 29 Visit Information History Since Last Visit Added or deleted any medications: No Patient Arrived: [name] Had a fall or experienced change in No Arrival Time: 15:03 activities of daily living that may affect Accompanied By: daughter risk of falls: Transfer Assistance: None Hospitalized since last visit: No Patient Requires Transmission-Based Precautions: No Has Dressing in Place as Prescribed: Yes Patient Has Alerts: Yes Pain Present Now: No Patient Alerts: NOT DIABETIC TBI left .77 TBI right .76 Electronic Signature(s) Signed: 05/12/2021 3:44:08 PM By: Donnamarie Poag Entered By: Donnamarie Poag on 05/12/2021 15:04:57 [name] ([id number]) -------------------------------------------------------------------------------- Clinic Level of Care Assessment Details Patient Name: [name], [name] Date of Service: 05/12/2021 3:15 PM Medical Record Number: [id number] Patient Account Number: [id number] Date of Birth/Sex: 1927-10-08 (87 y.o. F) Treating RN: Donnamarie Poag Primary Care Shifa Brisbon: Viviana Simpler Other Clinician: Referring Chicquita Mendel: Viviana Simpler Treating Carlicia Leavens/Extender: Skipper Cliche in Treatment: 29 Clinic Level of Care Assessment Items TOOL 4 Quantity Score []  - Use when only an EandM is performed on FOLLOW-UP visit 0 ASSESSMENTS - Nursing Assessment / Reassessment []  - Reassessment of Co-morbidities (includes updates in patient status) 0 []  - 0 Reassessment of Adherence to Treatment Plan ASSESSMENTS - Wound and Skin Assessment /  Reassessment X - Simple Wound Assessment / Reassessment - one wound 1 5 []  - 0 Complex Wound Assessment / Reassessment - multiple wounds []  - 0 Dermatologic / Skin Assessment (not related to wound area) ASSESSMENTS - Focused Assessment []  - Circumferential Edema Measurements - multi extremities 0 []  - 0 Nutritional Assessment / Counseling / Intervention []  - 0 Lower Extremity Assessment (monofilament, tuning fork, pulses) []  - 0 Peripheral Arterial Disease Assessment (using hand held doppler) ASSESSMENTS - Ostomy and/or Continence Assessment and Care []  - Incontinence Assessment and Management 0 []  - 0 Ostomy Care Assessment and Management (repouching, etc.) PROCESS - Coordination of Care X - Simple Patient / Family Education for ongoing care 1 15 []  - 0 Complex (extensive) Patient / Family Education for ongoing care []  - 0 Staff obtains Programmer, systems, Records, Test Results / Process Orders []  - 0 Staff telephones HHA, Nursing Homes / Clarify orders / etc []  - 0 Routine Transfer to another Facility (non-emergent condition) []  - 0 Routine Hospital Admission (non-emergent condition) []  - 0 New Admissions / Biomedical engineer / Ordering NPWT, Apligraf, etc. []  - 0 Emergency Hospital Admission (emergent condition) X- 1 10 Simple Discharge Coordination []  - 0 Complex (extensive) Discharge Coordination PROCESS - Special Needs []  - Pediatric / Minor Patient Management 0 []  - 0 Isolation Patient Management []  - 0 Hearing / Language / Visual special needs []  - 0 Assessment of Community assistance (transportation, D/C planning, etc.) []  - 0 Additional assistance / Altered mentation []  - 0 Support Surface(s) Assessment (bed, cushion, seat, etc.) INTERVENTIONS - Wound Cleansing / Measurement KAYTON, RIPP ([id number]) X- 1 5 Simple Wound Cleansing - one wound []  - 0 Complex Wound Cleansing - multiple wounds X- 1 5 Wound Imaging (photographs - any number of wounds) []   - 0 Wound Tracing (instead of photographs) X- 1 5 Simple Wound Measurement - one wound []  - 0 Complex Wound Measurement -  multiple wounds INTERVENTIONS - Wound Dressings X - Small Wound Dressing one or multiple wounds 1 10 []  - 0 Medium Wound Dressing one or multiple wounds []  - 0 Large Wound Dressing one or multiple wounds X- 1 5 Application of Medications - topical []  - 0 Application of Medications - injection INTERVENTIONS - Miscellaneous []  - External ear exam 0 []  - 0 Specimen Collection (cultures, biopsies, blood, body fluids, etc.) []  - 0 Specimen(s) / Culture(s) sent or taken to Lab for analysis []  - 0 Patient Transfer (multiple staff / Civil Service fast streamer / Similar devices) []  - 0 Simple Staple / Suture removal (25 or less) []  - 0 Complex Staple / Suture removal (26 or more) []  - 0 Hypo / Hyperglycemic Management (close monitor of Blood Glucose) []  - 0 Ankle / Brachial Index (ABI) - do not check if billed separately X- 1 5 Vital Signs Has the patient been seen at the hospital within the last three years: Yes Total Score: 65 Level Of Care: New/Established - Level 2 Electronic Signature(s) Signed: 05/12/2021 3:44:08 PM By: Donnamarie Poag Entered By: Donnamarie Poag on 05/12/2021 15:41:34 Paddock, [name] ([id number]) -------------------------------------------------------------------------------- Encounter Discharge Information Details Patient Name: [name], [name] Date of Service: 05/12/2021 3:15 PM Medical Record Number: [id number] Patient Account Number: [id number] Date of Birth/Sex: [birth year] (87 y.o. F) Treating RN: Donnamarie Poag Primary Care Florian Chauca: Viviana Simpler Other Clinician: Referring Baudelio Karnes: Viviana Simpler Treating Emet Rafanan/Extender: Skipper Cliche in Treatment: 29 Encounter Discharge Information Items Discharge Condition: Stable Ambulatory Status: Walker Discharge Destination: Home Transportation: Private Auto Accompanied By: daughter Schedule  Follow-up Appointment: Yes Clinical Summary of Care: Electronic Signature(s) Signed: 05/12/2021 3:44:08 PM By: Donnamarie Poag Entered By: Donnamarie Poag on 05/12/2021 15:42:54 Harcum, [name] ([id number]) -------------------------------------------------------------------------------- Lower Extremity Assessment Details Patient Name: [name], [name] Date of Service: 05/12/2021 3:15 PM Medical Record Number: [id number] Patient Account Number: [id number] Date of Birth/Sex: 09-13-1927 (87 y.o. F) Treating RN: Donnamarie Poag Primary Care Wilmina Maxham: Viviana Simpler Other Clinician: Referring Wiktoria Hemrick: Viviana Simpler Treating Nicha Hemann/Extender: Skipper Cliche in Treatment: 29 Edema Assessment Assessed: [Left: No] Patrice Paradise: Yes] Edema: [Left: N] [Right: o] Calf Left: Right: Point of Measurement: 33 cm From Medial Instep 21 cm Ankle Left: Right: Point of Measurement: 9 cm From Medial Instep 19 cm Vascular Assessment Pulses: Dorsalis Pedis Palpable: [Right:Yes] Electronic Signature(s) Signed: 05/12/2021 3:44:08 PM By: Donnamarie Poag Entered By: Donnamarie Poag on 05/12/2021 15:10:41 Boehlke, [name] ([id number]) -------------------------------------------------------------------------------- Multi Wound Chart Details Patient Name: [name] Date of Service: 05/12/2021 3:15 PM Medical Record Number: [id number] Patient Account Number: [id number] Date of Birth/Sex: 08/23/1927 (87 y.o. F) Treating RN: Donnamarie Poag Primary Care Moiz Ryant: Viviana Simpler Other Clinician: Referring Aubreana Cornacchia: Viviana Simpler Treating Anesa Fronek/Extender: Skipper Cliche in Treatment: 29 Vital Signs Height(in): 66 Pulse(bpm): 55 Weight(lbs): 54 Blood Pressure(mmHg): 135/62 Body Mass Index(BMI): 12 Temperature(F): 98.3 Respiratory Rate(breaths/min): 16 Photos: [N/A:N/A] Wound Location: Right, Lateral Malleolus N/A N/A Wounding Event: Gradually Appeared N/A N/A Primary Etiology: Pressure Ulcer N/A N/A Comorbid  History: Arrhythmia, Coronary Artery N/A N/A Disease, Hypertension, Peripheral Arterial Disease, History of pressure wounds, Osteoarthritis Date Acquired: 10/22/2014 N/A N/A Weeks of Treatment: 29 N/A N/A Wound Status: Open N/A N/A Measurements L x W x D (cm) 0.4x0.5x0.1 N/A N/A Area (cm) : 0.157 N/A N/A Volume (cm) : 0.016 N/A N/A % Reduction in Area: 80.00% N/A N/A % Reduction in Volume: 79.70% N/A N/A Classification: Category/Stage III N/A N/A Exudate Amount: Medium N/A N/A Exudate Type: Serous N/A N/A Exudate Color:  amber N/A N/A Wound Margin: Flat and Intact N/A N/A Granulation Amount: Large (67-100%) N/A N/A Granulation Quality: Red N/A N/A Necrotic Amount: Small (1-33%) N/A N/A Exposed Structures: Fat Layer (Subcutaneous Tissue): N/A N/A Yes Fascia: No Tendon: No Muscle: No Joint: No Bone: No Epithelialization: Small (1-33%) N/A N/A Treatment Notes Electronic Signature(s) Signed: 05/12/2021 3:44:08 PM By: Donnamarie Poag Entered By: Donnamarie Poag on 05/12/2021 15:13:29 Sangha, [name] ([id number]) -------------------------------------------------------------------------------- [place] Details Patient Name: [name], [name] Date of Service: 05/12/2021 3:15 PM Medical Record Number: [id number] Patient Account Number: [id number] Date of Birth/Sex: 12/18/1927 (87 y.o. F) Treating RN: Donnamarie Poag Primary Care Devian Bartolomei: Viviana Simpler Other Clinician: Referring Libbey Duce: Viviana Simpler Treating Deanna Boehlke/Extender: Skipper Cliche in Treatment: 16 Active Inactive Electronic Signature(s) Signed: 05/12/2021 3:44:08 PM By: Donnamarie Poag Entered By: Donnamarie Poag on 05/12/2021 15:13:19 [name], [name] ([id number]) -------------------------------------------------------------------------------- Pain Assessment Details Patient Name: [name], [name] Date of Service: 05/12/2021 3:15 PM Medical Record Number: [id number] Patient Account Number: [id number] Date of  Birth/Sex: 22-Dec-1927 (87 y.o. F) Treating RN: Donnamarie Poag Primary Care Khoa Opdahl: Viviana Simpler Other Clinician: Referring Alexee Delsanto: Viviana Simpler Treating Admiral Marcucci/Extender: Skipper Cliche in Treatment: 29 Active Problems Location of Pain Severity and Description of Pain Patient Has Paino No Site Locations Rate the pain. Current Pain Level: 0 Pain Management and Medication Current Pain Management: Electronic Signature(s) Signed: 05/12/2021 3:44:08 PM By: Donnamarie Poag Entered By: Donnamarie Poag on 05/12/2021 15:08:05 [name] ([id number]) -------------------------------------------------------------------------------- Patient/Caregiver Education Details Patient Name: [name] Date of Service: 05/12/2021 3:15 PM Medical Record Number: [id number] Patient Account Number: [id number] Date of Birth/Gender: May 24, 1928 (87 y.o. F) Treating RN: Donnamarie Poag Primary Care Physician: Viviana Simpler Other Clinician: Referring Physician: Viviana Simpler Treating Physician/Extender: Skipper Cliche in Treatment: 38 Education Assessment Education Provided To: Patient Education Topics Provided Basic Hygiene: Wound/Skin Impairment: Engineer, maintenance) Signed: 05/12/2021 3:44:08 PM By: Donnamarie Poag Entered By: Donnamarie Poag on 05/12/2021 15:41:57 [name], [name] ([id number]) -------------------------------------------------------------------------------- Wound Assessment Details Patient Name: [name], [name] Date of Service: 05/12/2021 3:15 PM Medical Record Number: [id number] Patient Account Number: [id number] Date of Birth/Sex: 08-24-1927 (87 y.o. F) Treating RN: Donnamarie Poag Primary Care Mamye Bolds: Viviana Simpler Other Clinician: Referring Lorina Duffner: Viviana Simpler Treating Elynor Kallenberger/Extender: Skipper Cliche in Treatment: 29 Wound Status Wound Number: 1 Primary Pressure Ulcer Etiology: Wound Location: Right, Lateral Malleolus Wound Open Wounding Event: Gradually  Appeared Status: Date Acquired: 10/22/2014 Comorbid Arrhythmia, Coronary Artery Disease, Hypertension, Weeks Of Treatment: 29 History: Peripheral Arterial Disease, History of pressure wounds, Clustered Wound: No Osteoarthritis Photos Wound Measurements Length: (cm) 0.4 Width: (cm) 0.5 Depth: (cm) 0.1 Area: (cm) 0.157 Volume: (cm) 0.016 % Reduction in Area: 80% % Reduction in Volume: 79.7% Epithelialization: Small (1-33%) Tunneling: No Undermining: No Wound Description Classification: Category/Stage III Wound Margin: Flat and Intact Exudate Amount: Medium Exudate Type: Serous Exudate Color: amber Foul Odor After Cleansing: No Slough/Fibrino Yes Wound Bed Granulation Amount: Large (67-100%) Exposed Structure Granulation Quality: Red Fascia Exposed: No Necrotic Amount: Small (1-33%) Fat Layer (Subcutaneous Tissue) Exposed: Yes Necrotic Quality: Adherent Slough Tendon Exposed: No Muscle Exposed: No Joint Exposed: No Bone Exposed: No Treatment Notes Wound #1 (Malleolus) Wound Laterality: Right, Lateral Cleanser Soap and Water Discharge Instruction: Gently cleanse wound with antibacterial soap, rinse and pat dry prior to dressing wounds [name], [name] ([id number]) Peri-Wound Care Topical Primary Dressing Hydrofera Blue Ready Transfer Foam, 2.5x2.5 (in/in) Discharge Instruction: Apply Hydrofera Blue Ready to wound bed as directed Secondary Dressing Mepilex Border  Flex, 4x4 (in/in) Discharge Instruction: Apply to wound as directed. Do not cut. Secured With Compression Wrap Compression Stockings Add-Ons Tubigrip D Electronic Signature(s) Signed: 05/12/2021 3:44:08 PM By: Donnamarie Poag Entered By: Donnamarie Poag on 05/12/2021 15:09:52 [name], [name] ([id number]) -------------------------------------------------------------------------------- [place] Details Patient Name: [name] Date of Service: 05/12/2021 3:15 PM Medical Record Number: [id number] Patient  Account Number: [id number] Date of Birth/Sex: [birth year] (87 y.o. F) Treating RN: Donnamarie Poag Primary Care Stephanie Mcglone: Viviana Simpler Other Clinician: Referring Markanthony Gedney: Viviana Simpler Treating [place]/Extender: Skipper Cliche in Treatment: 29 Vital Signs Time Taken: 15:05 Temperature (F): 98.3 Height (in): 66 Pulse (bpm): 55 Weight (lbs): 76 Respiratory Rate (breaths/min): 16 Body Mass Index (BMI): 12.3 Blood Pressure (mmHg): 135/62 Reference Range: 80 - 120 mg / dl Electronic Signature(s) Signed: 05/12/2021 3:44:08 PM By: Donnamarie Poag Entered ByDonnamarie Poag on 05/12/2021 15:07:47

## 2021-05-18 ENCOUNTER — Telehealth: Payer: Self-pay

## 2021-05-18 NOTE — Telephone Encounter (Signed)
I have nothing tomorrow and it sounds like something might be going on. Starting at urgent care sounds like a good plan--they can direct her to ER if appropriate I will check note in the morning

## 2021-05-18 NOTE — Telephone Encounter (Signed)
[name] (DPR signed) said pt started with symptoms 2 days ago. [name] just got there 15 mins ago and pt said she was cold, having chills, chest and throat congestion,and difficulty breathing; cannot give more info about the difficulty breathing. Pt has prod cough with ? Clear phlegm. No wheezing and no CP but pt said she feels bad. [place] with [facility] said pt T 98.5, P 55, R 14, BP 106/68 and oxygen level 100 % and lungs sound pretty clear. Wants to know if pt can be seen at [facility] this afternoon. No available appts are available at [place] today or tomorrow.  Dr Silvio Pate said pt should probably go be evaluated to determine what is causing pt to feel so bad, URI or more or blood clot. [name] said the EMS had gone but then realized they were still there. Ronald and Dillion with EMS said he did not know how many hours they would be at ED; I asked him not to influence family in neg way and Dillion said was up to family if wanted pt to be transported to ED or not he cannot kidnap pt; I advised that I knew he could not kidnap a pt but not to be negative to influence their decision and Dillion said he was not doing that. I said OK and asked Stefanee Mckell what he was going to do. He came back to phone in few mins and said they were going to take pt now to [facility] [address] [facility] [place]. Sending note to Dr Silvio Pate and Larene Beach CMA.

## 2021-05-19 ENCOUNTER — Ambulatory Visit: Payer: PPO | Admitting: Physician Assistant

## 2021-05-19 NOTE — Telephone Encounter (Signed)
I spoke with [name] (DPR signed) pt did not go to UC; [name] said this morning the cough is not as bad and pt is up moving around. [name] asked pt if she wanted to come see Dr Silvio Pate this morning and pt said no. UC & ED precautions given and [name] voiced understanding and appreciative. Sending note to Dr :Silvio Pate and Larene Beach CMA.

## 2021-05-19 NOTE — Telephone Encounter (Signed)
Okay I am glad to hear that she is better

## 2021-05-20 ENCOUNTER — Ambulatory Visit: Admission: EM | Admit: 2021-05-20 | Discharge: 2021-05-20 | Payer: PPO

## 2021-05-20 ENCOUNTER — Emergency Department: Payer: PPO

## 2021-05-20 ENCOUNTER — Encounter: Payer: Self-pay | Admitting: Emergency Medicine

## 2021-05-20 ENCOUNTER — Other Ambulatory Visit: Payer: Self-pay

## 2021-05-20 DIAGNOSIS — I10 Essential (primary) hypertension: Secondary | ICD-10-CM | POA: Diagnosis not present

## 2021-05-20 DIAGNOSIS — R0902 Hypoxemia: Secondary | ICD-10-CM | POA: Diagnosis not present

## 2021-05-20 DIAGNOSIS — R531 Weakness: Secondary | ICD-10-CM | POA: Diagnosis not present

## 2021-05-20 DIAGNOSIS — U071 COVID-19: Secondary | ICD-10-CM | POA: Diagnosis not present

## 2021-05-20 DIAGNOSIS — Z87891 Personal history of nicotine dependence: Secondary | ICD-10-CM | POA: Diagnosis not present

## 2021-05-20 DIAGNOSIS — Z853 Personal history of malignant neoplasm of breast: Secondary | ICD-10-CM | POA: Diagnosis not present

## 2021-05-20 DIAGNOSIS — Z85828 Personal history of other malignant neoplasm of skin: Secondary | ICD-10-CM | POA: Diagnosis not present

## 2021-05-20 DIAGNOSIS — J90 Pleural effusion, not elsewhere classified: Secondary | ICD-10-CM | POA: Diagnosis not present

## 2021-05-20 DIAGNOSIS — Z79899 Other long term (current) drug therapy: Secondary | ICD-10-CM | POA: Diagnosis not present

## 2021-05-20 DIAGNOSIS — R059 Cough, unspecified: Secondary | ICD-10-CM | POA: Diagnosis not present

## 2021-05-20 DIAGNOSIS — E876 Hypokalemia: Secondary | ICD-10-CM | POA: Diagnosis not present

## 2021-05-20 LAB — BASIC METABOLIC PANEL
Anion gap: 6 (ref 5–15)
BUN: 17 mg/dL (ref 8–23)
CO2: 24 mmol/L (ref 22–32)
Calcium: 8.3 mg/dL — ABNORMAL LOW (ref 8.9–10.3)
Chloride: 109 mmol/L (ref 98–111)
Creatinine, Ser: 0.86 mg/dL (ref 0.44–1.00)
GFR, Estimated: >60 mL/min (ref >60)
Glucose, Bld: 120 mg/dL — ABNORMAL HIGH (ref 70–99)
Potassium: 3.1 mmol/L — ABNORMAL LOW (ref 3.5–5.1)
Sodium: 139 mmol/L (ref 135–145)

## 2021-05-20 LAB — CBC
HCT: 36.5 % (ref 36.0–46.0)
Hemoglobin: 11.4 g/dL — ABNORMAL LOW (ref 12.0–15.0)
MCH: 28.3 pg (ref 26.0–34.0)
MCHC: 31.2 g/dL (ref 30.0–36.0)
MCV: 90.6 fL (ref 80.0–100.0)
Platelets: 185 10*3/uL (ref 150–400)
RBC: 4.03 MIL/uL (ref 3.87–5.11)
RDW: 14.9 % (ref 11.5–15.5)
WBC: 4.7 10*3/uL (ref 4.0–10.5)
nRBC: 0 % (ref 0.0–0.2)

## 2021-05-20 LAB — RESP PANEL BY RT-PCR (FLU A&B, COVID) ARPGX2
Influenza A by PCR: NEGATIVE
Influenza B by PCR: NEGATIVE
SARS Coronavirus 2 by RT PCR: POSITIVE — AB

## 2021-05-20 NOTE — ED Triage Notes (Signed)
Pt endorses cough, lack of appetitive and not feeling well for a few days.

## 2021-05-20 NOTE — ED Triage Notes (Addendum)
Pt c/o chills.cough, EM was called to the house last night due to her sxs and was advised to go to the urgent care or ER for further evaluation. Pts son and her daughter in law brought her in the clinic.Upon entering the room the daughter in law was behind the and pt fell in the room trying to get in the chair and landed on her left side while trying to sit in the chair. She did not appear to hit her head.

## 2021-05-20 NOTE — ED Provider Notes (Signed)
Emergency Medicine Provider Triage Evaluation Note  [name], a 87 y.o. female  was evaluated in triage.  Pt complains of loss of appetite and malaise for the last several days.  Patient presents with her adult daughter for evaluation of several days worth of cough and low energy.  Review of Systems  Positive: Cough, weakness Negative: FCS  Physical Exam  BP (!) 171/78   Pulse 68   Temp 98.4 F (36.9 C) (Oral)   Resp 20   Ht 5\' 6"  (1.676 m)   Wt 37.6 kg   SpO2 92%   BMI 13.40 kg/m  Gen:   Awake, no distress  NAD Resp:  Normal effort CTA MSK:   Moves extremities without difficulty  Other:  CVS: RRR  Medical Decision Making  Medically screening exam initiated at 5:21 PM.  Appropriate orders placed.  [name] was informed that the remainder of the evaluation will be completed by another provider, this initial triage assessment does not replace that evaluation, and the importance of remaining in the ED until their evaluation is complete.  Geriatric patient with ED evaluation of several days of cough and weakness.   Melvenia Needles, PA-C 05/20/21 1730    Vladimir Crofts, MD 05/20/21 1757

## 2021-05-21 ENCOUNTER — Emergency Department
Admission: EM | Admit: 2021-05-21 | Discharge: 2021-05-21 | Disposition: A | Discharge status: Home / Self Care | Payer: PPO | Attending: Emergency Medicine | Admitting: Emergency Medicine

## 2021-05-21 DIAGNOSIS — U071 COVID-19: Secondary | ICD-10-CM

## 2021-05-21 DIAGNOSIS — R531 Weakness: Secondary | ICD-10-CM

## 2021-05-21 DIAGNOSIS — E876 Hypokalemia: Secondary | ICD-10-CM

## 2021-05-21 LAB — URINALYSIS, ROUTINE W REFLEX MICROSCOPIC
Bacteria, UA: NONE SEEN
Bilirubin Urine: NEGATIVE
Glucose, UA: NEGATIVE mg/dL
Ketones, ur: 15 mg/dL — AB
Leukocytes,Ua: NEGATIVE
Nitrite: NEGATIVE
Protein, ur: 100 mg/dL — AB
Specific Gravity, Urine: >1.03 — ABNORMAL HIGH (ref 1.005–1.030)
pH: 6 (ref 5.0–8.0)

## 2021-05-21 MED ORDER — NIRMATRELVIR/RITONAVIR (PAXLOVID)TABLET: 3.0000 | ORAL_TABLET | Freq: Two times a day (BID) | ORAL | 0 refills | Status: AC

## 2021-05-21 MED ORDER — POTASSIUM CHLORIDE CRYS ER 20 MEQ PO TBCR
40.0000 meq | EXTENDED_RELEASE_TABLET | Freq: Once | ORAL | Status: AC
  Administered 2021-05-21: 40 meq via ORAL
  Filled 2021-05-21: qty 2

## 2021-05-21 MED ORDER — POTASSIUM CHLORIDE CRYS ER 20 MEQ PO TBCR: 20.0000 meq | EXTENDED_RELEASE_TABLET | Freq: Every day | ORAL | 0 refills | Status: DC

## 2021-05-21 NOTE — ED Provider Notes (Signed)
[facility] Emergency Department Provider Note  ____________________________________________   Event Date/Time   First MD Initiated Contact with Patient 05/21/21 0515     (approximate)  I have reviewed the triage vital signs and the nursing notes.   HISTORY  Chief Complaint Weakness  Patient is elderly and substantial history is provided by son and daughter-in-law (daughter-in-law is a primary caregiver).  HPI [name] is a 87 y.o. female with medical history as listed below who presents for evaluation of several days of generalized weakness and malaise.  She has had a mild cough, not much appetite, but no other specific symptoms.  She said that sometimes she has a bit of a headache but is having no neck pain.  She denies sore throat, chest pain, shortness of breath, abdominal pain, nausea, and vomiting.  No dysuria.  Nothing in particular makes the symptoms better or worse.  Symptoms are moderate in severity.     Past Medical History:  Diagnosis Date   Arrhythmia    Arthritis    Atypical chest pain    Lexiscan myoview (5/11) with EF 84%, normal wall motion, small fixed apical  perfusion defect likely due to breast attenuation, no evidence for ischemia or infarction.  **Patient had an   Basal cell carcinoma 07/28/19 [facility]C   R pretibial   Breast cancer ([place])    Bilateral mastectomies 1986.   CVA (cerebral infarction) 10/12   right lacunar   Depression    Dyspnea    Echo (5/11) was a difficult study due to breast implants but showed normal LV and RV size and systolic function.       GERD (gastroesophageal reflux disease)    Headache    s/p shingles - right side of head   History of partial thyroidectomy    Hyperlipidemia    Hypertension    in past - no current meds/issues   Obstruction of intestine (HCC)    Partial small bowel obstruction ([place]) 7/14   no surgery   Superficial basal cell carcinoma 06/18/2019   R mid to distal pretibial    Wears dentures    full upper   Zoster     with Post-herpetic neuralgia    Patient Active Problem List   Diagnosis Date Noted   Decubitus ulcer of buttock, stage 2 ([place]) 01/16/2021   Protein-calorie malnutrition, severe 06/20/2020   Depression    Fall at home, initial encounter    Benzodiazepine dependence ([place]) 04/17/2018   PAD (peripheral artery disease) ([place]) 07/22/2017   Narcotic dependence ([place]) 01/03/2017   Thrombocytopenia ([place]) 09/20/2016   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   Postherpetic neuralgia 12/15/2015   Advance directive discussed with patient 10/27/2015   Allergic rhinitis due to pollen 05/20/2015   Routine general medical examination at a health care facility 02/22/2014   Protein-calorie malnutrition ([place]) 03/24/2012   Hypertension    B12 deficiency 05/03/2009   Neuropathy ([place]) 04/04/2009   Episodic mood disorder ([place]) 06/16/2008   GLAUCOMA NOS 11/14/2006   SLEEP DISORDER 11/14/2006   HYPERLIPIDEMIA 08/28/2006   GERD 08/28/2006   OSTEOARTHRITIS 08/28/2006   BREAST CANCER, HX OF 08/28/2006    Past Surgical History:  Procedure Laterality Date   ABDOMINAL HYSTERECTOMY     BREAST SURGERY Bilateral    cancer - mastectomy and implant insertion   INTRAMEDULLARY (IM) NAIL INTERTROCHANTERIC Left 06/19/2020   Procedure: INTRAMEDULLARY (IM) NAIL INTERTROCHANTRIC;  Surgeon: Earnestine Leys, MD;  Location: [facility] ORS;  Service: Orthopedics;  Laterality: Left;   MASTECTOMY  1980   bilateral   NECK SURGERY     TARSORRHAPHY Bilateral 08/16/2015   Procedure: MINOR TARSORRAPHY LATERAL PLACEMENT;  Surgeon: Karle Starch, MD;  Location: [place];  Service: Ophthalmology;  Laterality: Bilateral;   VAGINAL DELIVERY      Prior to Admission medications   Medication Sig Start Date End Date Taking? Authorizing Provider  nirmatrelvir/ritonavir EUA (PAXLOVID) 20 x 150 MG & 10 x 100MG  TABS Take 3 tablets by mouth 2 (two) times daily for 5 days. Patient GFR  is >60 mL/min. Take nirmatrelvir (150 mg) two tablets twice daily for 5 days and ritonavir (100 mg) one tablet twice daily for 5 days. 05/21/21 05/26/21 Yes Hinda Kehr, MD  potassium chloride SA (KLOR-CON M20) 20 MEQ tablet Take 1 tablet (20 mEq total) by mouth daily. 05/21/21  Yes Hinda Kehr, MD  acyclovir (ZOVIRAX) 400 MG tablet TAKE 1 TABLET BY MOUTH 4 TIMES DAILY 07/23/20   Venia Carbon, MD  azelastine (ASTELIN) 0.1 % nasal spray Place 2 sprays into both nostrils 2 (two) times daily. 05/20/20   [provider]  fexofenadine (ALLEGRA) 180 MG tablet Take 180 mg by mouth daily.    [provider]  fluticasone (FLONASE) 50 MCG/ACT nasal spray PLACE 2 SPRAYS INTO EACH NOSTRIL ONCE DAILY Patient taking differently: Place 2 sprays into both nostrils daily. 03/05/19   Venia Carbon, MD  hydrocortisone 2.5 % cream Apply topically 3 (three) times daily as needed. 11/16/19   Venia Carbon, MD  LORazepam (ATIVAN) 1 MG tablet TAKE 1/2 TO ONE TABLET BY MOUTH TWICE DAILY AS NEEDED FOR ANXIETY 04/03/21   Viviana Simpler I, MD  morphine (MSIR) 15 MG tablet TAKE 1 TABLET BY MOUTH 3 TIMES DAILY AS NEEDED FOR PAIN 05/09/21   Venia Carbon, MD  Multiple Vitamin (MULTIVITAMIN) tablet Take 1 tablet by mouth once daily    [provider]  pantoprazole (PROTONIX) 40 MG tablet TAKE 1 TABLET BY MOUTH TWICE A DAY 12/09/20   Viviana Simpler I, MD  polyethylene glycol (MIRALAX / GLYCOLAX) packet Take 17 g by mouth at bedtime.    [provider]  RESTASIS 0.05 % ophthalmic emulsion Place 1 drop into both eyes 2 (two) times daily. 02/24/18   [provider]  silver sulfADIAZINE (SILVADENE) 1 % cream Apply 1 application topically daily. On open area on buttock 01/16/21   Venia Carbon, MD  topiramate (TOPAMAX) 50 MG tablet Take 1 tablet (50 mg total) by mouth at bedtime. 09/29/20   Venia Carbon, MD    Allergies Duloxetine, Cymbalta [duloxetine hcl], Maxitrol  [neomycin-polymyxin-dexameth], Pregabalin, and Tobradex [tobramycin-dexamethasone]  Family History  Problem Relation Age of Onset   Heart disease Son        open heart surgery for a blockage   Heart Problems Son    Diabetes Son    Parkinson's disease Son     Social History Social History   Tobacco Use   Smoking status: Former   Smokeless tobacco: Never  Substance Use Topics   Alcohol use: No    Alcohol/week: 0.0 standard drinks   Drug use: No    Review of Systems Constitutional: Positive for general malaise.  No fever/chills Eyes: No visual changes. ENT: No sore throat. Cardiovascular: Denies chest pain. Respiratory: Denies shortness of breath. Gastrointestinal: No abdominal pain.  No nausea, no vomiting.  No diarrhea.  No constipation. Genitourinary: Negative for dysuria. Musculoskeletal: Negative for neck  pain.  Negative for back pain. Integumentary: Negative for rash. Neurological: Negative for headaches, focal weakness or numbness.   ____________________________________________   PHYSICAL EXAM:  VITAL SIGNS: ED Triage Vitals  Enc Vitals Group     BP 05/20/21 1638 (!) 171/78     Pulse Rate 05/20/21 1638 68     Resp 05/20/21 1638 20     Temp 05/20/21 1638 98.4 F (36.9 C)     Temp Source 05/20/21 1638 Oral     SpO2 05/20/21 1638 92 %     Weight 05/20/21 1639 37.6 kg (83 lb)     Height 05/20/21 1639 1.676 m (5\' 6" )     Head Circumference --      Peak Flow --      Pain Score 05/20/21 1639 0     Pain Loc --      Pain Edu? --      Excl. in [place]? --     Constitutional: Alert and oriented but with memory changes associated with old age.  No acute distress. Head: Atraumatic. Nose: No congestion/rhinnorhea. Mouth/Throat: Patient is wearing a mask. Neck: No stridor.  No meningeal signs.   Cardiovascular: Normal rate, regular rhythm. Good peripheral circulation. Respiratory: Normal respiratory effort.  No retractions.  No wheezing.  No cough during my time in  the room. Gastrointestinal: Cachectic at baseline.  Soft and nontender. No distention.  Musculoskeletal: No lower extremity tenderness nor edema. No gross deformities of extremities. Neurologic:  Normal speech and language. No gross focal neurologic deficits are appreciated.  Skin:  Skin is warm, dry and intact. Psychiatric: Mood and affect are normal. Speech and behavior are normal.  ____________________________________________   LABS (all labs ordered are listed, but only abnormal results are displayed)  Labs Reviewed  RESP PANEL BY RT-PCR (FLU A&B, COVID) ARPGX2 - Abnormal; Notable for the following components:      Result Value   SARS Coronavirus 2 by RT PCR POSITIVE (*)    All other components within normal limits  BASIC METABOLIC PANEL - Abnormal; Notable for the following components:   Potassium 3.1 (*)    Glucose, Bld 120 (*)    Calcium 8.3 (*)    All other components within normal limits  CBC - Abnormal; Notable for the following components:   Hemoglobin 11.4 (*)    All other components within normal limits  URINALYSIS, ROUTINE W REFLEX MICROSCOPIC - Abnormal; Notable for the following components:   Specific Gravity, Urine >1.030 (*)    Hgb urine dipstick TRACE (*)    Ketones, ur 15 (*)    Protein, ur 100 (*)    All other components within normal limits   ____________________________________________  EKG  ED ECG REPORT I, Hinda Kehr, the attending physician, personally viewed and interpreted this ECG.  Date: 05/20/2021 EKG Time: 16: 44 Rate: 69 Rhythm: sinus rhythm with occasional PVC and PAC QRS Axis: normal Intervals: normal ST/T Wave abnormalities: Non-specific ST segment / T-wave changes, but no clear evidence of acute ischemia. Narrative Interpretation: no definitive evidence of acute ischemia; does not meet STEMI criteria.  ____________________________________________  RADIOLOGY I, Hinda Kehr, personally viewed and evaluated these images (plain  radiographs) as part of my medical decision making, as well as reviewing the written report by the radiologist.  ED MD interpretation: Probable atelectasis, possible pneumonia  Official radiology report(s): DG Chest 2 View  Result Date: 05/20/2021 CLINICAL DATA:  Cough EXAM: CHEST - 2 VIEW COMPARISON:  Radiograph 12/19/2015 FINDINGS: Unchanged cardiomediastinal silhouette.  There is a small right pleural effusion with adjacent right basilar opacities. The left lung is clear. No visible pneumothorax. No acute osseous abnormality. There are chronic calcifications partially visualized overlying the abdomen. There is a chronic left proximal humerus fracture. No acute osseous abnormality. IMPRESSION: Small right pleural effusion with adjacent right basilar opacities, which could be atelectasis or pneumonia. Electronically Signed   By: Maurine Simmering M.D.   On: 05/20/2021 17:36    ____________________________________________   PROCEDURES   Procedure(s) performed (including Critical Care):  Procedures   ____________________________________________   INITIAL IMPRESSION / MDM / [place] / ED COURSE  As part of my medical decision making, I reviewed the following data within the [place] History obtained from family, Nursing notes reviewed and incorporated, Labs reviewed , EKG interpreted , Old chart reviewed, Radiograph reviewed , and Notes from prior ED visits   Differential diagnosis includes, but is not limited to, COVID-19, UTI, pneumonia, electrolyte or metabolic abnormality.  Vital signs are stable other than hypertension which increased over time as she waited a very long time in the emergency department (due to overwhelming ED and hospital patient volumes and limited staffing).  Lab work is generally reassuring; patient has no evidence of UTI and no urinary symptoms, normal basic metabolic panel other than mild hypokalemia at 3.1, and a normal CBC.  However she  is COVID-positive.  No evidence of ischemia on EKG and no chest pain.  I personally reviewed the patient's imaging and agree with the radiologist's interpretation that there may be some atelectasis or atypical infection but that is consistent with her respiratory viral panel results.  No hypoxemia, no dyspnea, cachectic at baseline with long-term issues with nutrition and not wanting to eat, but no evidence of acute illness at this time requiring hospitalization.  I had a long conversation with the patient and her son and daughter-in-law about the appropriate disposition.  I explained that I do not think that hospitalization would be beneficial to her and in fact could make her worse with the development of delirium while in the hospital.  I suggested discharged with care at home and a prescription for paxlovid with close follow-up with PCP.  They understand and agree with the plan.  I provided a first dose of potassium 40 mill equivalents in the ED, prescriptions as listed below, and I gave my usual and customary return precautions.  They all understand and agree with the plan.           ____________________________________________  FINAL CLINICAL IMPRESSION(S) / ED DIAGNOSES  Final diagnoses:  COVID-19  Generalized weakness  Hypokalemia     MEDICATIONS GIVEN DURING THIS VISIT:  Medications  potassium chloride SA (KLOR-CON) CR tablet 40 mEq (has no administration in time range)     ED Discharge Orders          Ordered    nirmatrelvir/ritonavir EUA (PAXLOVID) 20 x 150 MG & 10 x 100MG  TABS  2 times daily        05/21/21 0554    potassium chloride SA (KLOR-CON M20) 20 MEQ tablet  Daily        05/21/21 0554             Note:  This document was prepared using Dragon voice recognition software and may include unintentional dictation errors.   Hinda Kehr, MD 05/21/21 [phone]

## 2021-05-21 NOTE — Discharge Instructions (Signed)
As we discussed, [name] has COVID-19 which is causing her symptoms.  We discussed coming into the hospital versus trying treatment at home, and at this point we believe that treating at home is the best option for her.  Please fill the prescription for the medication prescribed (paxlovid) which can help limit the duration of her symptoms.  Encourage her to eat and drink in order to stay hydrated and provide some nutrition to help her fight the illness.  We also prescribed a potassium supplement because her potassium level was a little bit low, but if you can encourage her to eat including potassium rich foods, that will also help.  Follow-up with a phone call to her primary care doctor on Monday.  Return to the emergency department with new or worsening symptoms that concern you.

## 2021-05-22 ENCOUNTER — Telehealth: Payer: Self-pay

## 2021-05-22 NOTE — Telephone Encounter (Signed)
Called pt to see how she was doing after ER visit yesterday with Positive Covid result. If someone calls back, please note here how she is doing.

## 2021-05-26 ENCOUNTER — Ambulatory Visit: Payer: PPO | Admitting: Physician Assistant

## 2021-06-02 ENCOUNTER — Other Ambulatory Visit: Payer: Self-pay | Admitting: Internal Medicine

## 2021-06-05 ENCOUNTER — Encounter: Payer: PPO | Admitting: Internal Medicine

## 2021-06-05 NOTE — Telephone Encounter (Signed)
Caryl Pina with [facility] called stating that pt is out of medication and needs medication today.

## 2021-06-06 MED ORDER — LORAZEPAM 1 MG PO TABS: ORAL_TABLET | ORAL | 0 refills | Status: DC

## 2021-06-06 NOTE — Telephone Encounter (Signed)
Last OV - 07/112022 Next OV - 08/17/2021 Last Filled - 04/03/2021

## 2021-06-06 NOTE — Addendum Note (Signed)
Addended by: Dalia Heading R on: 06/06/2021 09:07 AM   Modules accepted: Orders

## 2021-06-06 NOTE — Addendum Note (Signed)
Addended by: Viviana Simpler I on: 06/06/2021 01:17 PM   Modules accepted: Orders

## 2021-06-12 ENCOUNTER — Other Ambulatory Visit: Payer: Self-pay | Admitting: Internal Medicine

## 2021-06-12 NOTE — Telephone Encounter (Signed)
Name of Medication: Morphine Sulfate Name of [facility]acy: [facility] or Written Date and Quantity: 05-09-21 #90 Last Office Visit and Type: 01-16-21-22 Next Office Visit and Type: 08-17-21 Last Controlled Substance Agreement Date: 09-29-20 Last UDS: 04-27-19

## 2021-06-13 ENCOUNTER — Encounter: Payer: PPO | Attending: Physician Assistant | Admitting: Physician Assistant

## 2021-06-13 ENCOUNTER — Other Ambulatory Visit: Payer: Self-pay

## 2021-06-13 DIAGNOSIS — L89513 Pressure ulcer of right ankle, stage 3: Secondary | ICD-10-CM | POA: Diagnosis not present

## 2021-06-13 DIAGNOSIS — I739 Peripheral vascular disease, unspecified: Secondary | ICD-10-CM | POA: Diagnosis not present

## 2021-06-13 DIAGNOSIS — I2510 Atherosclerotic heart disease of native coronary artery without angina pectoris: Secondary | ICD-10-CM | POA: Insufficient documentation

## 2021-06-13 DIAGNOSIS — L89623 Pressure ulcer of left heel, stage 3: Secondary | ICD-10-CM | POA: Insufficient documentation

## 2021-06-13 NOTE — Progress Notes (Addendum)
Elizabeth, [name] ([id number]) Visit Report for 06/13/2021 Arrival Information Details Patient Name: Elizabeth, Downs Date of Service: 06/13/2021 3:15 PM Medical Record Number: [id number] Patient Account Number: [id number] Date of Birth/Sex: 1928-01-11 (87 y.o. F) Treating RN: Donnamarie Poag Primary Care Jancarlos Thrun: Viviana Simpler Other Clinician: Referring Eltha Tingley: Viviana Simpler Treating Tonika Eden/Extender: Skipper Cliche in Treatment: 31 Visit Information History Since Last Visit Added or deleted any medications: No Patient Arrived: [name] Had a fall or experienced change in Yes Arrival Time: 15:14 activities of daily living that may affect Accompanied By: daughter in law risk of falls: Transfer Assistance: [employer] Patient Hospitalized since last visit: No Lift Has Dressing in Place as Prescribed: Yes Patient Identification Verified: Yes Pain Present Now: No Secondary Verification Process Completed: Yes Patient Requires Transmission-Based No Precautions: Patient Has Alerts: Yes Patient Alerts: NOT DIABETIC TBI left .77 TBI right .76 Electronic Signature(s) Signed: 06/13/2021 4:02:29 PM By: Donnamarie Poag Entered By: Donnamarie Poag on 06/13/2021 15:17:24 Blaker, [name] ([id number]) -------------------------------------------------------------------------------- Compression Therapy Details Patient Name: Elizabeth, Downs Date of Service: 06/13/2021 3:15 PM Medical Record Number: [id number] Patient Account Number: [id number] Date of Birth/Sex: 1927-12-09 (87 y.o. F) Treating RN: Donnamarie Poag Primary Care Belinda Schlichting: Viviana Simpler Other Clinician: Referring Rayden Dock: Viviana Simpler Treating Demetrice Combes/Extender: Skipper Cliche in Treatment: 33 Compression Therapy Performed for Wound Assessment: Wound #1 Right,Lateral Malleolus Performed By: Junius Argyle, RN Compression Type: Three Layer Post Procedure Diagnosis Same as Pre-procedure Electronic Signature(s) Signed:  06/13/2021 4:02:29 PM By: Donnamarie Poag Entered By: Donnamarie Poag on 06/13/2021 15:36:37 Jorstad, [name] ([id number]) -------------------------------------------------------------------------------- Compression Therapy Details Patient Name: Elizabeth, Downs Date of Service: 06/13/2021 3:15 PM Medical Record Number: [id number] Patient Account Number: [id number] Date of Birth/Sex: [birth year] (87 y.o. F) Treating RN: Donnamarie Poag Primary Care Kenzie Flakes: Viviana Simpler Other Clinician: Referring Graelyn Bihl: Viviana Simpler Treating Thor Nannini/Extender: Skipper Cliche in Treatment: 33 Compression Therapy Performed for Wound Assessment: Wound #5 Left Calcaneus Performed By: Clinician Donnamarie Poag, RN Compression Type: Three Layer Post Procedure Diagnosis Same as Pre-procedure Electronic Signature(s) Signed: 06/13/2021 4:02:29 PM By: Donnamarie Poag Entered By: Donnamarie Poag on 06/13/2021 15:36:37 Moren, [name] ([id number]) -------------------------------------------------------------------------------- Encounter Discharge Information Details Patient Name: Elizabeth, Downs Date of Service: 06/13/2021 3:15 PM Medical Record Number: [id number] Patient Account Number: [id number] Date of Birth/Sex: [birth year] (87 y.o. F) Treating RN: Donnamarie Poag Primary Care Hurman Ketelsen: Viviana Simpler Other Clinician: Referring Ambree Frances: Viviana Simpler Treating Rayelle Armor/Extender: Skipper Cliche in Treatment: 86 Encounter Discharge Information Items Post Procedure Vitals Discharge Condition: Stable Temperature (F): 98.0 Ambulatory Status: Walker Pulse (bpm): 59 Discharge Destination: Home Respiratory Rate (breaths/min): 16 Transportation: Private Auto Blood Pressure (mmHg): 146/81 Accompanied By: daughter in law Schedule Follow-up Appointment: Yes Clinical Summary of Care: Electronic Signature(s) Signed: 06/13/2021 4:02:29 PM By: Donnamarie Poag Entered By: Donnamarie Poag on 06/13/2021 15:54:58 Hammac, [name]  ([id number]) -------------------------------------------------------------------------------- Lower Extremity Assessment Details Patient Name: Elizabeth, Downs Date of Service: 06/13/2021 3:15 PM Medical Record Number: [id number] Patient Account Number: [id number] Date of Birth/Sex: 1928-04-17 (87 y.o. F) Treating RN: Donnamarie Poag Primary Care Tremel Setters: Viviana Simpler Other Clinician: Referring Miko Sirico: Viviana Simpler Treating Coraima Tibbs/Extender: Skipper Cliche in Treatment: 33 Edema Assessment Assessed: [Left: No] Patrice Paradise: Yes] Edema: [Left: N] [Right: o] Calf Left: Right: Point of Measurement: 33 cm From Medial Instep 21 cm Ankle Left: Right: Point of Measurement: 9 cm From Medial Instep 18.5 cm Vascular Assessment Pulses: Dorsalis Pedis Palpable: [Right:Yes] Electronic Signature(s) Signed: 06/13/2021 4:02:29 PM By: Donnamarie Poag Entered By:  Donnamarie Poag on 06/13/2021 15:26:28 Aracelys, Glade [name] ([id number]) -------------------------------------------------------------------------------- Multi Wound Chart Details Patient Name: Elizabeth, Downs. Date of Service: 06/13/2021 3:15 PM Medical Record Number: [id number] Patient Account Number: [id number] Date of Birth/Sex: 31-Oct-1927 (87 y.o. F) Treating RN: Donnamarie Poag Primary Care Flay Ghosh: Viviana Simpler Other Clinician: Referring Kingsten Enfield: Viviana Simpler Treating Amine Adelson/Extender: Skipper Cliche in Treatment: 58 Vital Signs Height(in): 66 Pulse(bpm): 59 Weight(lbs): 76 Blood Pressure(mmHg): 146/81 Body Mass Index(BMI): 12 Temperature(F): 98.0 Respiratory Rate(breaths/min): 16 Photos: [N/A:N/A] Wound Location: Right, Lateral Malleolus N/A N/A Wounding Event: Gradually Appeared N/A N/A Primary Etiology: Pressure Ulcer N/A N/A Comorbid History: Arrhythmia, Coronary Artery N/A N/A Disease, Hypertension, Peripheral Arterial Disease, History of pressure wounds, Osteoarthritis Date Acquired: 10/22/2014 N/A N/A Weeks of  Treatment: 33 N/A N/A Wound Status: Open N/A N/A Measurements L x W x D (cm) 1x1x0.2 N/A N/A Area (cm) : 0.785 N/A N/A Volume (cm) : 0.157 N/A N/A % Reduction in Area: 0.00% N/A N/A % Reduction in Volume: -98.70% N/A N/A Classification: Category/Stage III N/A N/A Exudate Amount: Medium N/A N/A Exudate Type: Serous N/A N/A Exudate Color: amber N/A N/A Wound Margin: Flat and Intact N/A N/A Granulation Amount: Large (67-100%) N/A N/A Granulation Quality: Red N/A N/A Necrotic Amount: Small (1-33%) N/A N/A Exposed Structures: Fat Layer (Subcutaneous Tissue): N/A N/A Yes Fascia: No Tendon: No Muscle: No Joint: No Bone: No Epithelialization: Small (1-33%) N/A N/A Treatment Notes Electronic Signature(s) Signed: 06/13/2021 4:02:29 PM By: Donnamarie Poag Entered By: Donnamarie Poag on 06/13/2021 15:30:25 Kuhlmann, [name] ([id number]) -------------------------------------------------------------------------------- [place] Details Patient Name: BECCI, BATTY Date of Service: 06/13/2021 3:15 PM Medical Record Number: [id number] Patient Account Number: [id number] Date of Birth/Sex: [birth year] (87 y.o. F) Treating RN: Donnamarie Poag Primary Care Evie Croston: Viviana Simpler Other Clinician: Referring Jrue Yambao: Viviana Simpler Treating Tanique Matney/Extender: Skipper Cliche in Treatment: 69 Active Inactive Electronic Signature(s) Signed: 06/13/2021 4:02:29 PM By: Donnamarie Poag Entered By: Donnamarie Poag on 06/13/2021 15:29:36 Dues, [name] ([id number]) -------------------------------------------------------------------------------- Pain Assessment Details Patient Name: CHI, WOODHAM Date of Service: 06/13/2021 3:15 PM Medical Record Number: [id number] Patient Account Number: [id number] Date of Birth/Sex: 1927/10/16 (87 y.o. F) Treating RN: Donnamarie Poag Primary Care Kailene Steinhart: Viviana Simpler Other Clinician: Referring Kiva Norland: Viviana Simpler Treating Beyounce Dickens/Extender: Skipper Cliche in Treatment: 24 Active Problems Location of Pain Severity and Description of Pain Patient Has Paino No Site Locations Rate the pain. Current Pain Level: 0 Pain Management and Medication Current Pain Management: Electronic Signature(s) Signed: 06/13/2021 4:02:29 PM By: Donnamarie Poag Entered By: Donnamarie Poag on 06/13/2021 15:21:28 [name] ([id number]) -------------------------------------------------------------------------------- Patient/Caregiver Education Details Patient Name: TIFFINIE, CAILLIER Date of Service: 06/13/2021 3:15 PM Medical Record Number: [id number] Patient Account Number: [id number] Date of Birth/Gender: 1927-10-28 (87 y.o. F) Treating RN: Donnamarie Poag Primary Care Physician: Viviana Simpler Other Clinician: Referring Physician: Viviana Simpler Treating Physician/Extender: Skipper Cliche in Treatment: 71 Education Assessment Education Provided To: Patient Education Topics Provided Wound Debridement: Wound/Skin Impairment: Electronic Signature(s) Signed: 06/13/2021 4:02:29 PM By: Donnamarie Poag Entered By: Donnamarie Poag on 06/13/2021 15:54:09 Shortridge, [name] ([id number]) -------------------------------------------------------------------------------- Wound Assessment Details Patient Name: YULANDA, DIGGS Date of Service: 06/13/2021 3:15 PM Medical Record Number: [id number] Patient Account Number: [id number] Date of Birth/Sex: 06-09-1928 (87 y.o. F) Treating RN: Donnamarie Poag Primary Care Jerek Meulemans: Viviana Simpler Other Clinician: Referring Maitland Lesiak: Viviana Simpler Treating Annalyse Langlais/Extender: Skipper Cliche in Treatment: 33 Wound Status Wound Number: 1 Primary Pressure Ulcer Etiology: Wound Location: Right, Lateral Malleolus Wound Open Wounding  Event: Gradually Appeared Status: Date Acquired: 10/22/2014 Comorbid Arrhythmia, Coronary Artery Disease, Hypertension, Weeks Of Treatment: 33 History: Peripheral Arterial Disease, History of  pressure wounds, Clustered Wound: No Osteoarthritis Photos Wound Measurements Length: (cm) 1 Width: (cm) 1 Depth: (cm) 0.2 Area: (cm) 0.785 Volume: (cm) 0.157 % Reduction in Area: 0% % Reduction in Volume: -98.7% Epithelialization: Small (1-33%) Tunneling: No Undermining: No Wound Description Classification: Category/Stage III Wound Margin: Flat and Intact Exudate Amount: Medium Exudate Type: Serous Exudate Color: amber Foul Odor After Cleansing: No Slough/Fibrino Yes Wound Bed Granulation Amount: Large (67-100%) Exposed Structure Granulation Quality: Red Fascia Exposed: No Necrotic Amount: Small (1-33%) Fat Layer (Subcutaneous Tissue) Exposed: Yes Necrotic Quality: Adherent Slough Tendon Exposed: No Muscle Exposed: No Joint Exposed: No Bone Exposed: No Treatment Notes Wound #1 (Malleolus) Wound Laterality: Right, Lateral Cleanser Soap and Water Discharge Instruction: Gently cleanse wound with antibacterial soap, rinse and pat dry prior to dressing wounds BROOKLEN, RUNQUIST ([id number]) Peri-Wound Care Topical Primary Dressing Prisma 4.34 (in) Discharge Instruction: Moisten w/normal saline or sterile water; Cover wound as directed. Do not remove from wound bed. Secondary Dressing ABD Pad 5x9 (in/in) Discharge Instruction: Cover with ABD pad Drawtex Hydroconductive Wound Dressing,o2x2 (in/in) Discharge Instruction: Apply to wound with drainage. Secured With Compression Wrap Profore Lite LF 3 Multilayer Compression Bandaging System Discharge Instruction: Apply 3 multi-layer wrap as prescribed. Compression Stockings Add-Ons Electronic Signature(s) Signed: 06/13/2021 4:02:29 PM By: Donnamarie Poag Entered By: Donnamarie Poag on 06/13/2021 15:25:03 [name] ([id number]) -------------------------------------------------------------------------------- Wound Assessment Details Patient Name: GRIZELDA, PISCOPO Date of Service: 06/13/2021 3:15 PM Medical Record Number:  [id number] Patient Account Number: [id number] Date of Birth/Sex: Jul 29, 1927 (87 y.o. F) Treating RN: Donnamarie Poag Primary Care Donelda Mailhot: Viviana Simpler Other Clinician: Referring Iver Miklas: Viviana Simpler Treating Lonnell Chaput/Extender: Skipper Cliche in Treatment: 33 Wound Status Wound Number: 5 Primary Pressure Ulcer Etiology: Wound Location: Left Calcaneus Wound Open Wounding Event: Gradually Appeared Status: Date Acquired: 06/13/2021 Comorbid Arrhythmia, Coronary Artery Disease, Hypertension, Weeks Of Treatment: 0 History: Peripheral Arterial Disease, History of pressure wounds, Clustered Wound: No Osteoarthritis Photos Wound Measurements Length: (cm) 0.5 Width: (cm) 0.4 Depth: (cm) 0.2 Area: (cm) 0.157 Volume: (cm) 0.031 % Reduction in Area: % Reduction in Volume: Tunneling: No Undermining: No Wound Description Classification: Category/Stage III Exudate Amount: Medium Exudate Type: Serosanguineous Exudate Color: red, brown Foul Odor After Cleansing: No Slough/Fibrino Yes Wound Bed Granulation Amount: Large (67-100%) Exposed Structure Granulation Quality: Red, Pink Fascia Exposed: No Necrotic Amount: Small (1-33%) Fat Layer (Subcutaneous Tissue) Exposed: Yes Necrotic Quality: Adherent Slough Tendon Exposed: No Muscle Exposed: No Joint Exposed: No Bone Exposed: No Treatment Notes Wound #5 (Calcaneus) Wound Laterality: Left Cleanser Soap and Water Discharge Instruction: Gently cleanse wound with antibacterial soap, rinse and pat dry prior to dressing wounds Peri-Wound Care QUETZAL, MEANY ([id number]) Topical Primary Dressing Prisma 4.34 (in) Discharge Instruction: Moisten w/normal saline or sterile water; Cover wound as directed. Do not remove from wound bed. Secondary Dressing ABD Pad 5x9 (in/in) Discharge Instruction: Cover with ABD pad Drawtex Hydroconductive Wound Dressing,o2x2 (in/in) Discharge Instruction: Apply to wound with drainage. Secured  With Compression Wrap Profore Lite LF 3 Multilayer Compression Bandaging System Discharge Instruction: Apply 3 multi-layer wrap as prescribed. Compression Stockings Add-Ons Electronic Signature(s) Signed: 06/13/2021 4:02:29 PM By: Donnamarie Poag Entered By: Donnamarie Poag on 06/13/2021 15:35:41 Montas, [name] ([id number]) -------------------------------------------------------------------------------- [place] Details Patient Name: [name] Date of Service: 06/13/2021 3:15 PM Medical Record Number: [id number] Patient Account Number: [id number] Date of Birth/Sex:  1928-02-18 (87 y.o. F) Treating RN: Donnamarie Poag Primary Care Quayshaun Hubbert: Viviana Simpler Other Clinician: Referring Alexei Ey: Viviana Simpler Treating Ford Peddie/Extender: Skipper Cliche in Treatment: 33 Vital Signs Time Taken: 15:20 Temperature (F): 98.0 Height (in): 66 Pulse (bpm): 59 Weight (lbs): 76 Respiratory Rate (breaths/min): 16 Body Mass Index (BMI): 12.3 Blood Pressure (mmHg): 146/81 Reference Range: 80 - 120 mg / dl Electronic Signature(s) Signed: 06/13/2021 4:02:29 PM By: Donnamarie Poag Entered ByDonnamarie Poag on 06/13/2021 15:21:03

## 2021-06-13 NOTE — Progress Notes (Addendum)
[name], [name] ([id number]) Visit Report for 06/13/2021 Chief Complaint Document Details Patient Name: [name], LADA. Date of Service: 06/13/2021 3:15 PM Medical Record Number: [id number] Patient Account Number: [id number] Date of Birth/Sex: 18-Oct-1927 (87 y.o. F) Treating RN: Donnamarie Poag Primary Care Provider: Viviana Simpler Other Clinician: Referring Provider: Viviana Simpler Treating Provider/Extender: Skipper Cliche in Treatment: 59 Information Obtained from: Patient Chief Complaint Pressure ulcer right ankle and left heel Electronic Signature(s) Signed: 06/13/2021 3:40:00 PM By: Worthy Keeler PA-C Previous Signature: 06/13/2021 3:04:04 PM Version By: Worthy Keeler PA-C Entered By: Worthy Keeler on 06/13/2021 15:39:59 [name], [name] ([id number]) -------------------------------------------------------------------------------- Debridement Details Patient Name: [name] Date of Service: 06/13/2021 3:15 PM Medical Record Number: [id number] Patient Account Number: [id number] Date of Birth/Sex: 12-12-1927 (87 y.o. F) Treating RN: Donnamarie Poag Primary Care Provider: Viviana Simpler Other Clinician: Referring Provider: Viviana Simpler Treating Provider/Extender: Skipper Cliche in Treatment: 33 Debridement Performed for Wound #5 Left Calcaneus Assessment: Performed By: Physician Tommie Sams., PA-C Debridement Type: Debridement Level of Consciousness (Pre- Awake and Alert procedure): Pre-procedure Verification/Time Out Yes - 15:34 Taken: Start Time: 15:35 Pain Control: Lidocaine Total Area Debrided (L x W): 0.5 (cm) x 0.4 (cm) = 0.2 (cm) Tissue and other material Viable, Non-Viable, Slough, Subcutaneous, Skin: Dermis , Slough debrided: Level: Skin/Subcutaneous Tissue Debridement Description: Excisional Instrument: Curette Bleeding: Minimum Hemostasis Achieved: Pressure Response to Treatment: Procedure was tolerated well Level of Consciousness  (Post- Awake and Alert procedure): Post Debridement Measurements of Total Wound Length: (cm) 0.5 Stage: Category/Stage III Width: (cm) 0.4 Depth: (cm) 0.2 Volume: (cm) 0.031 Character of Wound/Ulcer Post Debridement: Improved Post Procedure Diagnosis Same as Pre-procedure Electronic Signature(s) Signed: 06/13/2021 4:02:29 PM By: Donnamarie Poag Signed: 06/13/2021 4:19:41 PM By: Worthy Keeler PA-C Entered By: Donnamarie Poag on 06/13/2021 15:37:29 [name], [name] ([id number]) -------------------------------------------------------------------------------- HPI Details Patient Name: [name], [name] Date of Service: 06/13/2021 3:15 PM Medical Record Number: [id number] Patient Account Number: [id number] Date of Birth/Sex: 27-Nov-1927 (87 y.o. F) Treating RN: Donnamarie Poag Primary Care Provider: Viviana Simpler Other Clinician: Referring Provider: Viviana Simpler Treating Provider/Extender: Skipper Cliche in Treatment: 31 History of Present Illness HPI Description: 10/21/2020 upon evaluation today patient presents for initial inspection here in our clinic concerning issues that she has been having quite some time in regard to her ankle and since she has been in the hospital with regard to the heel. The ankle in fact has been 5-6 years at least I am told. With that being said the heel ulcer occurred when she was in the hospital in December for hip surgery when she fractured her hip. She also was in the hospital Tuesday for altered mental status. Really there was nothing that was identified as the cause for this. She did have a. Fortunately there does not appear to be any signs of infection she tells me that she has had she believes arterial studies At [facility] clinic I could not find Those studies at this point. Nonetheless I will continue to look and see what I can find before Then. The patient also has a skin tear on her arm this occurred more recently when she bumped this at home. The patient  does have a history of hypertension, coronary artery disease, and peripheral vascular disease stated. 10/28/2020 I was able to find the patient's chart currently which shows that she did have an arterial study performed in May 2019. This showed that she had a abnormal right toe brachial index and  a normal left toe brachial index. She was noncompressible as far as ABIs were concerned. She did appear to have triphasic flow at that time. Unfortunately the wound that is commented on in the report that I printed off and read mentions the same wound on the ankle that we are still dealing with at this point. Unfortunately this has not healed. And its been quite sometime about 3 years now. Fortunately there does not appear to be any signs of active infection systemically at this point. I think that the patient has done well with the Santyl over the past week which is good news. Patient's caregiver which is her daughter-in-law is concerned about the fact that she really does not feel qualified to be able to change the dressings and take care of this issue. There does not appear to be any signs of anything untoward going on at this point. I think she is done a great job applying the [employer] and I think that has done a great job for the patient is for soften up some of the necrotic tissue. With that being said I think the Xeroform on the arm also has done excellent. In general I am very pleased with where we stand. And I told the patient's daughter-in- law as well that I also feel like she has done a great job over the past week taking care of her mother. 11/11/2020 upon evaluation today patient appears to be doing well with regard to her wounds. She has been tolerating the dressing changes without complication her daughters been applying the Santyl which has done a great job. Ho with that being said I think now that we have good arterial study showing we can go ahead and proceed with sharp debridement at this  point. 11/25/2020 upon evaluation today patient appears to be doing well with regard to her wounds. The Santyl has really helped to clean things up and overall she is doing quite excellent at this point. Fortunately there does not appear to be any signs of infection which is great news and in general extremely pleased. I do believe some debridement is in order and hopefully will be able to get her into a collagen dressing after this. 12/23/2020 upon evaluation today patient appears to be doing well with regard to her wound all things considered. Unfortunately it does sound like her daughter decided to let this air out because she felt like it was getting so wet. It sounds like she got border gauze dressings instead of border foam therefore there is really Nothing to catch the excess drainage which I think is the problem they were worried about the smell. Fortunately there does not appear to be any signs of active infection which is great news. Nonetheless I do not think we want to leave this just open to air. 01/13/2021 upon evaluation today patient's wounds actually appear to be about as good as have seen since have been taking care of her. Fortunately there does not appear to be any evidence of infection which is great and overall the biggest issue I see is that of fluid buildup. I think we need to do something to try to help manage this. I think if we can control her edema we get the wounds to heal more effectively. 01/27/2021 upon evaluation today patient appears to be doing well in regard to the wounds on her right lateral malleolus as well as the left heel and the right ischial tuberosity is unfortunately a new area that has arisen  since we last saw her. She tells me currently that that is from sitting too much. With that being said this actually appears to be doing the worst of anything so far that I see today. Fortunately there does not appear to be any signs of infection this is at least good  news. Compression wrap seem to be doing excellent for her as far as the legs are concerned. 02/03/2021 upon evaluation today patient appears to be doing well with regard to all of her wounds. She has been tolerating the dressing changes without complication. Fortunately there is no signs of active infection at this time. No fevers, chills, nausea, vomiting, or diarrhea. 02/10/2021 upon evaluation today patient appears to be doing well with regard to her wounds. With that being said there is some slight evidence of hypergranulation in regard to the right ankle in particular and a little bit in regard to the left heel. I think Hydrofera Blue might be a better option to go to at this point based on what I am seeing especially since both of these wounds in particular seem to be a little bit stalled. I discussed that with the patient and her daughter today. With regard to the hip this is doing great with the collagen I think will get very close to complete closure I recommend we continue with the collagen. 02/17/2021 upon evaluation today patient appears to be doing excellent in regard to her heel ulcers. She has been tolerating the dressing changes without complication and overall I am extremely pleased with where things stand today. There does not appear to be any signs of active infection which is great news. Overall I may think that we are headed in the proper direction based on what I am seeing. 02/24/2021 upon evaluation today patient's wounds actually are showing signs of improvement in regard to her heels both are doing awesome. In regard to her hip location this is actually reopened after being closed last week and to be perfectly honest I am more concerned here about the fact that pressures causing this issue I do not think it has anything to do with her not having the collagen in place again it was completely healed last week there was no reason for the collagen to be there. 03/03/2021 upon evaluation  today patient appears to be doing well with regard to her wounds. Everything is showing signs of improvement and [name], YOUSUF. ([id number]) doing some much better as far as the overall size of the wound. Fortunately I think we are headed in the right direction. 03/10/2021 upon evaluation today patient appears to be doing well with regard to her wound. She is tolerating the dressing changes without complication. The left heel pretty much appears to be almost completely healed although I think it is probably can be 1 more week before I can call this 100% slough. The right ankle is significantly improved with that being said its not completely closed. With regard to the right hip this is completely closed. Overall I am very pleased with where things stand I think were getting very close to complete resolution. I discussed all this with the patient and her daughter-in-law today. 03/17/2021 upon evaluation today patient appears to be doing well with regard to her wounds. Fortunately there does not appear to be any signs of active infection which is great news and overall very pleased with where the patient stands today. I am in general thankful that overall she is making great progress here. There is  good to be a little bit of debridement here to clear away some of the necrotic debris today on both wound locations. 03/24/2021 upon evaluation today patient appears to be doing excellent in regard to her wound. She has been tolerating the dressing changes without complication. Fortunately there does not appear to be any evidence of infection the left foot is completely healed this is great news. On the right at the ankle region this is still open though I do not think the alginate did quite as well as I was hoping as far as getting this dried out. I think that we may just want to switch back to the [facility] which has done well up to this point. I was just hopeful this would wrap things up completely for  [place]. 03/31/2021 upon evaluation today patient appears to be doing well with regard to her wound. This is on the right side which appears to be doing better than last week I think is definitely measuring smaller and this is great news. Overall I am very pleased with where we stand today. I do believe the [facility] is doing better for her. 04/14/2021 upon evaluation today patient appears to be doing well with regard to her wound. I am very pleased with the way it stands I think that she is headed in the right direction. Unfortunately she is having issues with the Tubigrip neither she nor her daughter-in-law who is the primary caregiver can get this on. Subsequently that means that they have been having a very difficult time with complying with the necessary things for this left leg. With that being said I am really not sure what to do to help in that regard. We can always try a bigger that is larger size Tubigrip but at the same time that means that she may not get as good compression and it may not keep things under control the only way to know is to try. 04/21/2021 upon evaluation today patient appears to be doing well in regard to her wound. She has been tolerating the dressing changes without complication and in fact the right ankle ulcer is actually showing signs of excellent improvement I am very pleased with where things stand today. No fevers, chills, nausea, vomiting, or diarrhea. 04/28/2021 upon evaluation today patient appears to be doing well with regard to her ankle ulcer. This is actually showing signs of good improvement which is great news and overall very pleased with where we stand today. No fevers, chills, nausea, vomiting, or diarrhea. 05/05/2021 upon evaluation today patient's wound is actually showing signs of significant improvement and overall I am extremely pleased with where we stand today. There does not appear to be any signs of active infection which is great news and  overall I am extremely pleased with where we stand at this point. No fevers, chills, nausea, vomiting, or diarrhea. 05/12/2021 upon evaluation today patient appears to be doing okay in regard to her wound is measuring a little bit smaller today that is good news. With that being said she still continues to have significant issues here with an open wound on the lateral aspect of her ankle. Fortunately there does not appear to be any signs of active infection systemically which is great news. No fevers, chills, nausea, vomiting, or diarrhea. 06/13/2021 upon evaluation today patient unfortunately continues to have issues with her right leg. Specifically the ankle region where there is an open wound. Previously the wound on the left leg had completely closed. Nonetheless  this left heel has reopened as well. I am going to perform some debridement in regard to the heel ulcer appears to be a fluid-filled blister underneath this area. She has not been here for such a long time due to the fact that she was very sick and they were not able to bring her out. That is the reason we have not seen her in over the past month. Electronic Signature(s) Signed: 06/13/2021 3:41:10 PM By: Worthy Keeler PA-C Entered By: Worthy Keeler on 06/13/2021 15:41:10 Shimizu, [name] ([id number]) -------------------------------------------------------------------------------- Physical Exam Details Patient Name: [name], [name] Date of Service: 06/13/2021 3:15 PM Medical Record Number: [id number] Patient Account Number: [id number] Date of Birth/Sex: 1927/11/09 (87 y.o. F) Treating RN: Donnamarie Poag Primary Care Provider: Viviana Simpler Other Clinician: Referring Provider: Viviana Simpler Treating Provider/Extender: Skipper Cliche in Treatment: 9 Constitutional Well-nourished and well-hydrated in no acute distress. Respiratory normal breathing without difficulty. Psychiatric this patient is able to make decisions and  demonstrates good insight into disease process. Alert and Oriented x 3. pleasant and cooperative. Notes Patient's wound on the left heel did require some debridement to clear away some of the necrotic debris including slough and biofilm as well as callus around the edges of the wound. There is minimal bleeding noted. Postdebridement this appears to be doing better but unfortunately is now open. The right heel is bigger but did not require sharp debridement today it is also quite a bit deeper. Electronic Signature(s) Signed: 06/13/2021 3:41:40 PM By: Worthy Keeler PA-C Entered By: Worthy Keeler on 06/13/2021 15:41:40 Branscomb, [name] ([id number]) -------------------------------------------------------------------------------- Physician Orders Details Patient Name: [name], [name] Date of Service: 06/13/2021 3:15 PM Medical Record Number: [id number] Patient Account Number: [id number] Date of Birth/Sex: [birth year] (87 y.o. F) Treating RN: Donnamarie Poag Primary Care Provider: Viviana Simpler Other Clinician: Referring Provider: Viviana Simpler Treating Provider/Extender: Skipper Cliche in Treatment: 23 Verbal / Phone Orders: No Diagnosis Coding ICD-10 Coding Code Description L89.513 Pressure ulcer of right ankle, stage 3 I10 Essential (primary) hypertension I25.10 Atherosclerotic heart disease of native coronary artery without angina pectoris I73.89 Other specified peripheral vascular diseases Follow-up Appointments o Return Appointment in 1 week. o Nurse Visit as needed Bathing/ Shower/ Hygiene o May shower with wound dressing protected with water repellent cover or cast protector. o No tub bath. Anesthetic (Use 'Patient Medications' Section for Anesthetic Order Entry) o Lidocaine applied to wound bed Edema Control - Lymphedema / Segmental Compressive Device / Other Bilateral Lower Extremities o Optional: One layer of unna paste to top of compression wrap (to act as an  anchor). o Elevate legs to the level of the heart and pump ankles as often as possible o Elevate leg(s) parallel to the floor when sitting. o DO YOUR BEST to sleep in the bed at night. DO NOT sleep in your recliner. Long hours of sitting in a recliner leads to swelling of the legs and/or potential wounds on your backside. Additional Orders / Instructions o Follow Nutritious Diet and Increase Protein Intake Wound Treatment Wound #1 - Malleolus Wound Laterality: Right, Lateral Cleanser: Soap and Water 1 x Per Week/15 Days Discharge Instructions: Gently cleanse wound with antibacterial soap, rinse and pat dry prior to dressing wounds Primary Dressing: Prisma 4.34 (in) 1 x Per Week/15 Days Discharge Instructions: Moisten w/normal saline or sterile water; Cover wound as directed. Do not remove from wound bed. Secondary Dressing: ABD Pad 5x9 (in/in) 1 x Per Week/15 Days Discharge Instructions: Cover with  ABD pad Secondary Dressing: Drawtex Hydroconductive Wound Dressing,o2x2 (in/in) 1 x Per Week/15 Days Discharge Instructions: Apply to wound with drainage. Compression Wrap: Profore Lite LF 3 Multilayer Compression Bandaging System 1 x Per Week/15 Days Discharge Instructions: Apply 3 multi-layer wrap as prescribed. Wound #5 - Calcaneus Wound Laterality: Left Cleanser: Soap and Water 1 x Per Week/15 Days Discharge Instructions: Gently cleanse wound with antibacterial soap, rinse and pat dry prior to dressing wounds Primary Dressing: Prisma 4.34 (in) 1 x Per Week/15 Days Discharge Instructions: Moisten w/normal saline or sterile water; Cover wound as directed. Do not remove from wound bed. Secondary Dressing: ABD Pad 5x9 (in/in) 1 x Per Week/15 Days [name], [name] ([id number]) Discharge Instructions: Cover with ABD pad Secondary Dressing: Drawtex Hydroconductive Wound Dressing,o2x2 (in/in) 1 x Per Week/15 Days Discharge Instructions: Apply to wound with drainage. Compression Wrap:  Profore Lite LF 3 Multilayer Compression Bandaging System 1 x Per Week/15 Days Discharge Instructions: Apply 3 multi-layer wrap as prescribed. Electronic Signature(s) Signed: 06/13/2021 4:02:29 PM By: Donnamarie Poag Signed: 06/13/2021 4:19:41 PM By: Worthy Keeler PA-C Entered By: Donnamarie Poag on 06/13/2021 15:38:47 Labo, [name] ([id number]) -------------------------------------------------------------------------------- Problem List Details Patient Name: [name], [name] Date of Service: 06/13/2021 3:15 PM Medical Record Number: [id number] Patient Account Number: [id number] Date of Birth/Sex: [birth year] (87 y.o. F) Treating RN: Donnamarie Poag Primary Care Provider: Viviana Simpler Other Clinician: Referring Provider: Viviana Simpler Treating Provider/Extender: Skipper Cliche in Treatment: 42 Active Problems ICD-10 Encounter Code Description Active Date MDM Diagnosis L89.513 Pressure ulcer of right ankle, stage 3 10/21/2020 No Yes L89.623 Pressure ulcer of left heel, stage 3 10/21/2020 No Yes I10 Essential (primary) hypertension 10/21/2020 No Yes I25.10 Atherosclerotic heart disease of native coronary artery without angina 10/21/2020 No Yes pectoris I73.89 Other specified peripheral vascular diseases 10/21/2020 No Yes Inactive Problems Resolved Problems ICD-10 Code Description Active Date Resolved Date S51.802A Unspecified open wound of left forearm, initial encounter 10/21/2020 10/21/2020 L89.312 Pressure ulcer of right buttock, stage 2 01/27/2021 01/27/2021 Electronic Signature(s) Signed: 06/13/2021 3:39:44 PM By: Worthy Keeler PA-C Previous Signature: 06/13/2021 3:04:01 PM Version By: Worthy Keeler PA-C Entered By: Worthy Keeler on 06/13/2021 15:39:44 [name], [name] ([id number]) -------------------------------------------------------------------------------- Progress Note Details Patient Name: [name] Date of Service: 06/13/2021 3:15 PM Medical Record Number:  [id number] Patient Account Number: [id number] Date of Birth/Sex: 1927-07-21 (87 y.o. F) Treating RN: Donnamarie Poag Primary Care Provider: Viviana Simpler Other Clinician: Referring Provider: Viviana Simpler Treating Provider/Extender: Skipper Cliche in Treatment: 33 Subjective Chief Complaint Information obtained from Patient Pressure ulcer right ankle and left heel History of Present Illness (HPI) 10/21/2020 upon evaluation today patient presents for initial inspection here in our clinic concerning issues that she has been having quite some time in regard to her ankle and since she has been in the hospital with regard to the heel. The ankle in fact has been 5-6 years at least I am told. With that being said the heel ulcer occurred when she was in the hospital in December for hip surgery when she fractured her hip. She also was in the hospital Tuesday for altered mental status. Really there was nothing that was identified as the cause for this. She did have a. Fortunately there does not appear to be any signs of infection she tells me that she has had she believes arterial studies At [facility] clinic I could not find Those studies at this point. Nonetheless I will continue to look and see what I  can find before Then. The patient also has a skin tear on her arm this occurred more recently when she bumped this at home. The patient does have a history of hypertension, coronary artery disease, and peripheral vascular disease stated. 10/28/2020 I was able to find the patient's chart currently which shows that she did have an arterial study performed in May 2019. This showed that she had a abnormal right toe brachial index and a normal left toe brachial index. She was noncompressible as far as ABIs were concerned. She did appear to have triphasic flow at that time. Unfortunately the wound that is commented on in the report that I printed off and read mentions the same wound on the ankle that we are  still dealing with at this point. Unfortunately this has not healed. And its been quite sometime about 3 years now. Fortunately there does not appear to be any signs of active infection systemically at this point. I think that the patient has done well with the Santyl over the past week which is good news. Patient's caregiver which is her daughter-in-law is concerned about the fact that she really does not feel qualified to be able to change the dressings and take care of this issue. There does not appear to be any signs of anything untoward going on at this point. I think she is done a great job applying the [employer] and I think that has done a great job for the patient is for soften up some of the necrotic tissue. With that being said I think the Xeroform on the arm also has done excellent. In general I am very pleased with where we stand. And I told the patient's daughter-in- law as well that I also feel like she has done a great job over the past week taking care of her mother. 11/11/2020 upon evaluation today patient appears to be doing well with regard to her wounds. She has been tolerating the dressing changes without complication her daughters been applying the Santyl which has done a great job. Ho with that being said I think now that we have good arterial study showing we can go ahead and proceed with sharp debridement at this point. 11/25/2020 upon evaluation today patient appears to be doing well with regard to her wounds. The Santyl has really helped to clean things up and overall she is doing quite excellent at this point. Fortunately there does not appear to be any signs of infection which is great news and in general extremely pleased. I do believe some debridement is in order and hopefully will be able to get her into a collagen dressing after this. 12/23/2020 upon evaluation today patient appears to be doing well with regard to her wound all things considered. Unfortunately it does sound  like her daughter decided to let this air out because she felt like it was getting so wet. It sounds like she got border gauze dressings instead of border foam therefore there is really Nothing to catch the excess drainage which I think is the problem they were worried about the smell. Fortunately there does not appear to be any signs of active infection which is great news. Nonetheless I do not think we want to leave this just open to air. 01/13/2021 upon evaluation today patient's wounds actually appear to be about as good as have seen since have been taking care of her. Fortunately there does not appear to be any evidence of infection which is great and overall the biggest  issue I see is that of fluid buildup. I think we need to do something to try to help manage this. I think if we can control her edema we get the wounds to heal more effectively. 01/27/2021 upon evaluation today patient appears to be doing well in regard to the wounds on her right lateral malleolus as well as the left heel and the right ischial tuberosity is unfortunately a new area that has arisen since we last saw her. She tells me currently that that is from sitting too much. With that being said this actually appears to be doing the worst of anything so far that I see today. Fortunately there does not appear to be any signs of infection this is at least good news. Compression wrap seem to be doing excellent for her as far as the legs are concerned. 02/03/2021 upon evaluation today patient appears to be doing well with regard to all of her wounds. She has been tolerating the dressing changes without complication. Fortunately there is no signs of active infection at this time. No fevers, chills, nausea, vomiting, or diarrhea. 02/10/2021 upon evaluation today patient appears to be doing well with regard to her wounds. With that being said there is some slight evidence of hypergranulation in regard to the right ankle in particular and a  little bit in regard to the left heel. I think Hydrofera Blue might be a better option to go to at this point based on what I am seeing especially since both of these wounds in particular seem to be a little bit stalled. I discussed that with the patient and her daughter today. With regard to the hip this is doing great with the collagen I think will get very close to complete closure I recommend we continue with the collagen. 02/17/2021 upon evaluation today patient appears to be doing excellent in regard to her heel ulcers. She has been tolerating the dressing changes without complication and overall I am extremely pleased with where things stand today. There does not appear to be any signs of active infection which is great news. Overall I may think that we are headed in the proper direction based on what I am seeing. 02/24/2021 upon evaluation today patient's wounds actually are showing signs of improvement in regard to her heels both are doing awesome. In [place], [place] ([id number]) regard to her hip location this is actually reopened after being closed last week and to be perfectly honest I am more concerned here about the fact that pressures causing this issue I do not think it has anything to do with her not having the collagen in place again it was completely healed last week there was no reason for the collagen to be there. 03/03/2021 upon evaluation today patient appears to be doing well with regard to her wounds. Everything is showing signs of improvement and doing some much better as far as the overall size of the wound. Fortunately I think we are headed in the right direction. 03/10/2021 upon evaluation today patient appears to be doing well with regard to her wound. She is tolerating the dressing changes without complication. The left heel pretty much appears to be almost completely healed although I think it is probably can be 1 more week before I can call this 100% slough. The right  ankle is significantly improved with that being said its not completely closed. With regard to the right hip this is completely closed. Overall I am very pleased with where things stand  I think were getting very close to complete resolution. I discussed all this with the patient and her daughter-in-law today. 03/17/2021 upon evaluation today patient appears to be doing well with regard to her wounds. Fortunately there does not appear to be any signs of active infection which is great news and overall very pleased with where the patient stands today. I am in general thankful that overall she is making great progress here. There is good to be a little bit of debridement here to clear away some of the necrotic debris today on both wound locations. 03/24/2021 upon evaluation today patient appears to be doing excellent in regard to her wound. She has been tolerating the dressing changes without complication. Fortunately there does not appear to be any evidence of infection the left foot is completely healed this is great news. On the right at the ankle region this is still open though I do not think the alginate did quite as well as I was hoping as far as getting this dried out. I think that we may just want to switch back to the [facility] which has done well up to this point. I was just hopeful this would wrap things up completely for [place]. 03/31/2021 upon evaluation today patient appears to be doing well with regard to her wound. This is on the right side which appears to be doing better than last week I think is definitely measuring smaller and this is great news. Overall I am very pleased with where we stand today. I do believe the [facility] is doing better for her. 04/14/2021 upon evaluation today patient appears to be doing well with regard to her wound. I am very pleased with the way it stands I think that she is headed in the right direction. Unfortunately she is having issues with the Tubigrip  neither she nor her daughter-in-law who is the primary caregiver can get this on. Subsequently that means that they have been having a very difficult time with complying with the necessary things for this left leg. With that being said I am really not sure what to do to help in that regard. We can always try a bigger that is larger size Tubigrip but at the same time that means that she may not get as good compression and it may not keep things under control the only way to know is to try. 04/21/2021 upon evaluation today patient appears to be doing well in regard to her wound. She has been tolerating the dressing changes without complication and in fact the right ankle ulcer is actually showing signs of excellent improvement I am very pleased with where things stand today. No fevers, chills, nausea, vomiting, or diarrhea. 04/28/2021 upon evaluation today patient appears to be doing well with regard to her ankle ulcer. This is actually showing signs of good improvement which is great news and overall very pleased with where we stand today. No fevers, chills, nausea, vomiting, or diarrhea. 05/05/2021 upon evaluation today patient's wound is actually showing signs of significant improvement and overall I am extremely pleased with where we stand today. There does not appear to be any signs of active infection which is great news and overall I am extremely pleased with where we stand at this point. No fevers, chills, nausea, vomiting, or diarrhea. 05/12/2021 upon evaluation today patient appears to be doing okay in regard to her wound is measuring a little bit smaller today that is good news. With that being said she still continues  to have significant issues here with an open wound on the lateral aspect of her ankle. Fortunately there does not appear to be any signs of active infection systemically which is great news. No fevers, chills, nausea, vomiting, or diarrhea. 06/13/2021 upon evaluation today  patient unfortunately continues to have issues with her right leg. Specifically the ankle region where there is an open wound. Previously the wound on the left leg had completely closed. Nonetheless this left heel has reopened as well. I am going to perform some debridement in regard to the heel ulcer appears to be a fluid-filled blister underneath this area. She has not been here for such a long time due to the fact that she was very sick and they were not able to bring her out. That is the reason we have not seen her in over the past month. Objective Constitutional Well-nourished and well-hydrated in no acute distress. Vitals Time Taken: 3:20 PM, Height: 66 in, Weight: 76 lbs, BMI: 12.3, Temperature: 98.0 F, Pulse: 59 bpm, Respiratory Rate: 16 breaths/min, Blood Pressure: 146/81 mmHg. Respiratory normal breathing without difficulty. Psychiatric this patient is able to make decisions and demonstrates good insight into disease process. Alert and Oriented x 3. pleasant and cooperative. [name], [name] ([id number]) General Notes: Patient's wound on the left heel did require some debridement to clear away some of the necrotic debris including slough and biofilm as well as callus around the edges of the wound. There is minimal bleeding noted. Postdebridement this appears to be doing better but unfortunately is now open. The right heel is bigger but did not require sharp debridement today it is also quite a bit deeper. Integumentary (Hair, Skin) Wound #1 status is Open. Original cause of wound was Gradually Appeared. The date acquired was: 10/22/2014. The wound has been in treatment 33 weeks. The wound is located on the Right,Lateral Malleolus. The wound measures 1cm length x 1cm width x 0.2cm depth; 0.785cm^2 area and 0.157cm^3 volume. There is Fat Layer (Subcutaneous Tissue) exposed. There is no tunneling or undermining noted. There is a medium amount of serous drainage noted. The wound margin is  flat and intact. There is large (67-100%) red granulation within the wound bed. There is a small (1-33%) amount of necrotic tissue within the wound bed including Adherent Slough. Wound #5 status is Open. Original cause of wound was Gradually Appeared. The date acquired was: 06/13/2021. The wound is located on the Left Calcaneus. The wound measures 0.5cm length x 0.4cm width x 0.2cm depth; 0.157cm^2 area and 0.031cm^3 volume. There is Fat Layer (Subcutaneous Tissue) exposed. There is no tunneling or undermining noted. There is a medium amount of serosanguineous drainage noted. There is large (67-100%) red, pink granulation within the wound bed. There is a small (1-33%) amount of necrotic tissue within the wound bed including Adherent Slough. Assessment Active Problems ICD-10 Pressure ulcer of right ankle, stage 3 Pressure ulcer of left heel, stage 3 Essential (primary) hypertension Atherosclerotic heart disease of native coronary artery without angina pectoris Other specified peripheral vascular diseases Procedures Wound #5 Pre-procedure diagnosis of Wound #5 is a Pressure Ulcer located on the Left Calcaneus . There was a Excisional Skin/Subcutaneous Tissue Debridement with a total area of 0.2 sq cm performed by Tommie Sams., PA-C. With the following instrument(s): Curette to remove Viable and Non-Viable tissue/material. Material removed includes Subcutaneous Tissue, Slough, and Skin: Dermis after achieving pain control using Lidocaine. A time out was conducted at 15:34, prior to the start of the procedure.  A Minimum amount of bleeding was controlled with Pressure. The procedure was tolerated well. Post Debridement Measurements: 0.5cm length x 0.4cm width x 0.2cm depth; 0.031cm^3 volume. Post debridement Stage noted as Category/Stage III. Character of Wound/Ulcer Post Debridement is improved. Post procedure Diagnosis Wound #5: Same as Pre-Procedure Pre-procedure diagnosis of Wound #5 is a  Pressure Ulcer located on the Left Calcaneus . There was a Three Layer Compression Therapy Procedure by Donnamarie Poag, RN. Post procedure Diagnosis Wound #5: Same as Pre-Procedure Wound #1 Pre-procedure diagnosis of Wound #1 is a Pressure Ulcer located on the Right,Lateral Malleolus . There was a Three Layer Compression Therapy Procedure by Donnamarie Poag, RN. Post procedure Diagnosis Wound #1: Same as Pre-Procedure Plan Follow-up Appointments: Return Appointment in 1 week. Nurse Visit as needed Bathing/ Shower/ Hygiene: May shower with wound dressing protected with water repellent cover or cast protector. No tub bath. Anesthetic (Use 'Patient Medications' Section for Anesthetic Order Entry): Lidocaine applied to wound bed Edema Control - Lymphedema / Segmental Compressive Device / OtherLUVERNE, [name] ([id number]) Optional: One layer of unna paste to top of compression wrap (to act as an anchor). Elevate legs to the level of the heart and pump ankles as often as possible Elevate leg(s) parallel to the floor when sitting. DO YOUR BEST to sleep in the bed at night. DO NOT sleep in your recliner. Long hours of sitting in a recliner leads to swelling of the legs and/or potential wounds on your backside. Additional Orders / Instructions: Follow Nutritious Diet and Increase Protein Intake WOUND #1: - Malleolus Wound Laterality: Right, Lateral Cleanser: Soap and Water 1 x Per Week/15 Days Discharge Instructions: Gently cleanse wound with antibacterial soap, rinse and pat dry prior to dressing wounds Primary Dressing: Prisma 4.34 (in) 1 x Per Week/15 Days Discharge Instructions: Moisten w/normal saline or sterile water; Cover wound as directed. Do not remove from wound bed. Secondary Dressing: ABD Pad 5x9 (in/in) 1 x Per Week/15 Days Discharge Instructions: Cover with ABD pad Secondary Dressing: Drawtex Hydroconductive Wound Dressing, 2x2 (in/in) 1 x Per Week/15 Days Discharge Instructions:  Apply to wound with drainage. Compression Wrap: Profore Lite LF 3 Multilayer Compression Bandaging System 1 x Per Week/15 Days Discharge Instructions: Apply 3 multi-layer wrap as prescribed. WOUND #5: - Calcaneus Wound Laterality: Left Cleanser: Soap and Water 1 x Per Week/15 Days Discharge Instructions: Gently cleanse wound with antibacterial soap, rinse and pat dry prior to dressing wounds Primary Dressing: Prisma 4.34 (in) 1 x Per Week/15 Days Discharge Instructions: Moisten w/normal saline or sterile water; Cover wound as directed. Do not remove from wound bed. Secondary Dressing: ABD Pad 5x9 (in/in) 1 x Per Week/15 Days Discharge Instructions: Cover with ABD pad Secondary Dressing: Drawtex Hydroconductive Wound Dressing, 2x2 (in/in) 1 x Per Week/15 Days Discharge Instructions: Apply to wound with drainage. Compression Wrap: Profore Lite LF 3 Multilayer Compression Bandaging System 1 x Per Week/15 Days Discharge Instructions: Apply 3 multi-layer wrap as prescribed. 1. I would recommend that we go ahead and switch over to silver collagen as the dressing of choice currently I think this is going to be better for her than just sticking with the Hydrofera Blue. We had discussed switching it last time she was going to come in and but unfortunately she was sick and has not made again until just today. 2. I am also can recommend that we continue her rather restart the 3 layer compression wraps bilaterally to try to keep the edema control and help with  getting this to heal more appropriately. We will see patient back for reevaluation in 1 week here in the clinic. If anything worsens or changes patient will contact our office for additional recommendations. Electronic Signature(s) Signed: 06/13/2021 3:42:15 PM By: Worthy Keeler PA-C Entered By: Worthy Keeler on 06/13/2021 15:42:15 [name], [name]  ([id number]) -------------------------------------------------------------------------------- SuperBill Details Patient Name: ZOPHIA, MARRONE Date of Service: 06/13/2021 Medical Record Number: [id number] Patient Account Number: [id number] Date of Birth/Sex: July 19, 1927 (87 y.o. F) Treating RN: Donnamarie Poag Primary Care Provider: Viviana Simpler Other Clinician: Referring Provider: Viviana Simpler Treating Provider/Extender: Skipper Cliche in Treatment: 33 Diagnosis Coding ICD-10 Codes Code Description L89.513 Pressure ulcer of right ankle, stage 3 I73.89 Other specified peripheral vascular diseases L89.623 Pressure ulcer of left heel, stage 3 I10 Essential (primary) hypertension I25.10 Atherosclerotic heart disease of native coronary artery without angina pectoris Facility Procedures CPT4 Code: 85929244 Description: 62863 - DEB SUBQ TISSUE 20 SQ CM/< Modifier: Quantity: 1 CPT4 Code: Description: ICD-10 Diagnosis Description L89.623 Pressure ulcer of left heel, stage 3 Modifier: Quantity: Physician Procedures CPT4 Code: 8177116 Description: 57903 - WC PHYS LEVEL 4 - EST PT Modifier: 25 Quantity: 1 CPT4 Code: Description: ICD-10 Diagnosis Description L89.513 Pressure ulcer of right ankle, stage 3 I73.89 Other specified peripheral vascular diseases L89.623 Pressure ulcer of left heel, stage 3 I10 Essential (primary) hypertension Modifier: Quantity: CPT4 Code: 8333832 Description: 11042 - WC PHYS SUBQ TISS 20 SQ CM Modifier: Quantity: 1 CPT4 Code: Description: ICD-10 Diagnosis Description L89.623 Pressure ulcer of left heel, stage 3 Modifier: Quantity: Electronic Signature(s) Signed: 06/13/2021 3:42:36 PM By: Worthy Keeler PA-C Entered By: Worthy Keeler on 06/13/2021 15:42:36

## 2021-06-13 NOTE — Progress Notes (Signed)
[name], STRYKER ([id number]) Visit Report for 06/13/2021 Fall Risk Assessment Details Patient Name: [name], [name]. Date of Service: 06/13/2021 3:15 PM Medical Record Number: [id number] Patient Account Number: [id number] Date of Birth/Sex: 28-Jan-1928 (87 y.o. F) Treating RN: Donnamarie Poag Primary Care Sadie Pickar: Viviana Simpler Other Clinician: Referring Maureen Duesing: Viviana Simpler Treating Kehinde Bowdish/Extender: Skipper Cliche in Treatment: 24 Fall Risk Assessment Items Have you had 2 or more falls in the last 12 monthso 0 No Have you had any fall that resulted in injury in the last 12 monthso 0 No FALLS RISK SCREEN History of falling - immediate or within 3 months 25 Yes Secondary diagnosis (Do you have 2 or more medical diagnoseso) 15 Yes Ambulatory aid None/bed rest/wheelchair/nurse 0 No Crutches/cane/walker 15 Yes Furniture 0 No Intravenous therapy Access/Saline/Heparin Lock 0 No Gait/Transferring Normal/ bed rest/ wheelchair 0 Yes Weak (short steps with or without shuffle, stooped but able to lift head while walking, may 0 No seek support from furniture) Impaired (short steps with shuffle, may have difficulty arising from chair, head down, impaired 0 No balance) Mental Status Oriented to own ability 0 Yes Electronic Signature(s) Signed: 06/13/2021 4:02:29 PM By: Donnamarie Poag Entered ByDonnamarie Poag on 06/13/2021 15:29:26

## 2021-06-15 DIAGNOSIS — L89519 Pressure ulcer of right ankle, unspecified stage: Secondary | ICD-10-CM | POA: Diagnosis not present

## 2021-06-15 DIAGNOSIS — E46 Unspecified protein-calorie malnutrition: Secondary | ICD-10-CM | POA: Diagnosis not present

## 2021-06-15 DIAGNOSIS — L89520 Pressure ulcer of left ankle, unstageable: Secondary | ICD-10-CM | POA: Diagnosis not present

## 2021-06-15 DIAGNOSIS — Z681 Body mass index (BMI) 19 or less, adult: Secondary | ICD-10-CM | POA: Diagnosis not present

## 2021-06-15 DIAGNOSIS — I739 Peripheral vascular disease, unspecified: Secondary | ICD-10-CM | POA: Diagnosis not present

## 2021-06-15 DIAGNOSIS — D692 Other nonthrombocytopenic purpura: Secondary | ICD-10-CM | POA: Diagnosis not present

## 2021-06-15 DIAGNOSIS — L89529 Pressure ulcer of left ankle, unspecified stage: Secondary | ICD-10-CM | POA: Diagnosis not present

## 2021-06-15 DIAGNOSIS — R7303 Prediabetes: Secondary | ICD-10-CM | POA: Diagnosis not present

## 2021-06-15 DIAGNOSIS — Z515 Encounter for palliative care: Secondary | ICD-10-CM | POA: Diagnosis not present

## 2021-06-15 DIAGNOSIS — L89510 Pressure ulcer of right ankle, unstageable: Secondary | ICD-10-CM | POA: Diagnosis not present

## 2021-06-20 ENCOUNTER — Other Ambulatory Visit: Payer: Self-pay

## 2021-06-20 ENCOUNTER — Ambulatory Visit: Payer: PPO | Admitting: Physician Assistant

## 2021-06-20 ENCOUNTER — Ambulatory Visit: Payer: PPO | Admitting: Internal Medicine

## 2021-06-20 ENCOUNTER — Encounter: Payer: PPO | Admitting: Physician Assistant

## 2021-06-20 DIAGNOSIS — L97422 Non-pressure chronic ulcer of left heel and midfoot with fat layer exposed: Secondary | ICD-10-CM | POA: Diagnosis not present

## 2021-06-20 DIAGNOSIS — L89513 Pressure ulcer of right ankle, stage 3: Secondary | ICD-10-CM | POA: Diagnosis not present

## 2021-06-20 DIAGNOSIS — I7389 Other specified peripheral vascular diseases: Secondary | ICD-10-CM | POA: Diagnosis not present

## 2021-06-20 DIAGNOSIS — L89623 Pressure ulcer of left heel, stage 3: Secondary | ICD-10-CM | POA: Diagnosis not present

## 2021-06-20 DIAGNOSIS — I10 Essential (primary) hypertension: Secondary | ICD-10-CM | POA: Diagnosis not present

## 2021-06-20 NOTE — Progress Notes (Addendum)
Elizabeth, Downs ([id number]) Visit Report for 06/20/2021 Arrival Information Details Patient Name: [name], [name] Date of Service: 06/20/2021 3:00 PM Medical Record Number: [id number] Patient Account Number: [id number] Date of Birth/Sex: 1928/01/17 (87 y.o. F) Treating RN: Levora Dredge Primary Care Kamauri Denardo: Viviana Simpler Other Clinician: Referring Loralai Eisman: Viviana Simpler Treating Deejay Koppelman/Extender: Skipper Cliche in Treatment: 86 Visit Information History Since Last Visit Added or deleted any medications: No Patient Arrived: [name] Any new allergies or adverse reactions: No Arrival Time: 14:58 Had a fall or experienced change in No Accompanied By: daughter in law activities of daily living that may affect Transfer Assistance: None risk of falls: Patient Identification Verified: Yes Hospitalized since last visit: No Secondary Verification Process Completed: Yes Has Dressing in Place as Prescribed: Yes Patient Requires Transmission-Based Precautions: No Has Compression in Place as Prescribed: No Patient Has Alerts: Yes Has Footwear/Offloading in Place as Prescribed: No Patient Alerts: NOT DIABETIC Pain Present Now: No TBI left .77 TBI right .76 Electronic Signature(s) Signed: 06/21/2021 4:05:40 PM By: Levora Dredge Entered By: Levora Dredge on 06/20/2021 14:59:26 Boisselle, [name] ([id number]) -------------------------------------------------------------------------------- Clinic Level of Care Assessment Details Patient Name: [name], Downs Date of Service: 06/20/2021 3:00 PM Medical Record Number: [id number] Patient Account Number: [id number] Date of Birth/Sex: [birth year] (87 y.o. F) Treating RN: Levora Dredge Primary Care Geniya [name]: Viviana Simpler Other Clinician: Referring Aundre Hietala: Viviana Simpler Treating Rainey Rodger/Extender: Skipper Cliche in Treatment: 34 Clinic Level of Care Assessment Items TOOL 4 Quantity Score X - Use when only an EandM is  performed on FOLLOW-UP visit 1 0 ASSESSMENTS - Nursing Assessment / Reassessment []  - Reassessment of Co-morbidities (includes updates in patient status) 0 []  - 0 Reassessment of Adherence to Treatment Plan ASSESSMENTS - Wound and Skin Assessment / Reassessment X - Simple Wound Assessment / Reassessment - one wound 1 5 []  - 0 Complex Wound Assessment / Reassessment - multiple wounds []  - 0 Dermatologic / Skin Assessment (not related to wound area) ASSESSMENTS - Focused Assessment []  - Circumferential Edema Measurements - multi extremities 0 []  - 0 Nutritional Assessment / Counseling / Intervention []  - 0 Lower Extremity Assessment (monofilament, tuning fork, pulses) []  - 0 Peripheral Arterial Disease Assessment (using hand held doppler) ASSESSMENTS - Ostomy and/or Continence Assessment and Care []  - Incontinence Assessment and Management 0 []  - 0 Ostomy Care Assessment and Management (repouching, etc.) PROCESS - Coordination of Care X - Simple Patient / Family Education for ongoing care 1 15 []  - 0 Complex (extensive) Patient / Family Education for ongoing care []  - 0 Staff obtains Programmer, systems, Records, Test Results / Process Orders []  - 0 Staff telephones HHA, Nursing Homes / Clarify orders / etc []  - 0 Routine Transfer to another Facility (non-emergent condition) []  - 0 Routine Hospital Admission (non-emergent condition) []  - 0 New Admissions / Biomedical engineer / Ordering NPWT, Apligraf, etc. []  - 0 Emergency Hospital Admission (emergent condition) []  - 0 Simple Discharge Coordination []  - 0 Complex (extensive) Discharge Coordination PROCESS - Special Needs []  - Pediatric / Minor Patient Management 0 []  - 0 Isolation Patient Management []  - 0 Hearing / Language / Visual special needs []  - 0 Assessment of Community assistance (transportation, D/C planning, etc.) []  - 0 Additional assistance / Altered mentation []  - 0 Support Surface(s) Assessment (bed,  cushion, seat, etc.) INTERVENTIONS - Wound Cleansing / Measurement [name], [name] ([id number]) X- 1 5 Simple Wound Cleansing - one wound []  - 0 Complex Wound Cleansing - multiple wounds X-  1 5 Wound Imaging (photographs - any number of wounds) []  - 0 Wound Tracing (instead of photographs) X- 1 5 Simple Wound Measurement - one wound []  - 0 Complex Wound Measurement - multiple wounds INTERVENTIONS - Wound Dressings X - Small Wound Dressing one or multiple wounds 1 10 []  - 0 Medium Wound Dressing one or multiple wounds []  - 0 Large Wound Dressing one or multiple wounds []  - 0 Application of Medications - topical []  - 0 Application of Medications - injection INTERVENTIONS - Miscellaneous []  - External ear exam 0 []  - 0 Specimen Collection (cultures, biopsies, blood, body fluids, etc.) []  - 0 Specimen(s) / Culture(s) sent or taken to Lab for analysis []  - 0 Patient Transfer (multiple staff / Civil Service fast streamer / Similar devices) []  - 0 Simple Staple / Suture removal (25 or less) []  - 0 Complex Staple / Suture removal (26 or more) []  - 0 Hypo / Hyperglycemic Management (close monitor of Blood Glucose) []  - 0 Ankle / Brachial Index (ABI) - do not check if billed separately X- 1 5 Vital Signs Has the patient been seen at the hospital within the last three years: Yes Total Score: 50 Level Of Care: New/Established - Level 2 Electronic Signature(s) Signed: 06/21/2021 4:05:40 PM By: Levora Dredge Entered By: Levora Dredge on 06/20/2021 15:53:21 Beswick, [name] ([id number]) -------------------------------------------------------------------------------- Compression Therapy Details Patient Name: [name] Date of Service: 06/20/2021 3:00 PM Medical Record Number: [id number] Patient Account Number: [id number] Date of Birth/Sex: [birth year] (87 y.o. F) Treating RN: Levora Dredge Primary Care Louna Rothgeb: Viviana Simpler Other Clinician: Referring Sacred Roa: Viviana Simpler Treating Taji Barretto/Extender: Skipper Cliche in Treatment: 34 Compression Therapy Performed for Wound Assessment: Wound #1 Right,Lateral Malleolus Performed By: Annie Paras, RN Compression Type: Three Layer Post Procedure Diagnosis Same as Pre-procedure Electronic Signature(s) Signed: 06/21/2021 4:05:40 PM By: Levora Dredge Entered By: Levora Dredge on 06/20/2021 15:53:54 Lozoya, [name] ([id number]) -------------------------------------------------------------------------------- Compression Therapy Details Patient Name: BUNA, CUPPETT Date of Service: 06/20/2021 3:00 PM Medical Record Number: [id number] Patient Account Number: [id number] Date of Birth/Sex: 11/25/1927 (87 y.o. F) Treating RN: Levora Dredge Primary Care Alvan Culpepper: Viviana Simpler Other Clinician: Referring Crandall Harvel: Viviana Simpler Treating Monterrius Cardosa/Extender: Skipper Cliche in Treatment: 34 Compression Therapy Performed for Wound Assessment: Non-Wound Location Performed By: Clinician Levora Dredge, RN Compression Type: Three Layer Location: Lower Extremity, Left Post Procedure Diagnosis Same as Pre-procedure Electronic Signature(s) Signed: 06/21/2021 4:05:40 PM By: Levora Dredge Entered By: Levora Dredge on 06/20/2021 15:54:24 Licht, [name] ([id number]) -------------------------------------------------------------------------------- Encounter Discharge Information Details Patient Name: KALEEYAH, CUFFIE Date of Service: 06/20/2021 3:00 PM Medical Record Number: [id number] Patient Account Number: [id number] Date of Birth/Sex: 1927-10-02 (87 y.o. F) Treating RN: Levora Dredge Primary Care Oriyah Lamphear: Viviana Simpler Other Clinician: Referring Renato Spellman: Viviana Simpler Treating Laveyah Oriol/Extender: Skipper Cliche in Treatment: 72 Encounter Discharge Information Items Discharge Condition: Stable Ambulatory Status: Walker Discharge Destination: Home Transportation:  Private Auto Accompanied By: daughter in law Schedule Follow-up Appointment: Yes Clinical Summary of Care: Patient Declined Electronic Signature(s) Signed: 06/21/2021 4:05:40 PM By: Levora Dredge Entered By: Levora Dredge on 06/20/2021 15:56:14 Cooprider, [name] ([id number]) -------------------------------------------------------------------------------- Lower Extremity Assessment Details Patient Name: MARAH, PARK Date of Service: 06/20/2021 3:00 PM Medical Record Number: [id number] Patient Account Number: [id number] Date of Birth/Sex: 07/29/1927 (87 y.o. F) Treating RN: Levora Dredge Primary Care Brookes Craine: Viviana Simpler Other Clinician: Referring Austyn Seier: Viviana Simpler Treating Kesler Wickham/Extender: Skipper Cliche in Treatment: 34 Edema Assessment Assessed: [Left: No] [Right: No] Edema: [  Left: No] [Right: No] Calf Left: Right: Point of Measurement: 33 cm From Medial Instep 19.7 cm 20 cm Ankle Left: Right: Point of Measurement: 9 cm From Medial Instep 19.7 cm 1804 cm Vascular Assessment Pulses: Dorsalis Pedis Palpable: [Left:Yes] [Right:Yes] Electronic Signature(s) Signed: 06/21/2021 4:05:40 PM By: Levora Dredge Entered By: Levora Dredge on 06/20/2021 15:22:06 Massimino, [name] ([id number]) -------------------------------------------------------------------------------- Multi Wound Chart Details Patient Name: [name] Date of Service: 06/20/2021 3:00 PM Medical Record Number: [id number] Patient Account Number: [id number] Date of Birth/Sex: [birth year] (87 y.o. F) Treating RN: Levora Dredge Primary Care Dalbert Stillings: Viviana Simpler Other Clinician: Referring Ezrah Panning: Viviana Simpler Treating Rawn Quiroa/Extender: Skipper Cliche in Treatment: 34 Vital Signs Height(in): 43 Pulse(bpm): 75 Weight(lbs): 70 Blood Pressure(mmHg): 149/77 Body Mass Index(BMI): 12 Temperature(F): 97.4 Respiratory Rate(breaths/min): 18 Photos: [N/A:N/A] Wound Location:  Right, Lateral Malleolus Left Calcaneus N/A Wounding Event: Gradually Appeared Gradually Appeared N/A Primary Etiology: Pressure Ulcer Pressure Ulcer N/A Comorbid History: Arrhythmia, Coronary Artery Arrhythmia, Coronary Artery N/A Disease, Hypertension, Peripheral Disease, Hypertension, Peripheral Arterial Disease, History of pressure Arterial Disease, History of pressure wounds, Osteoarthritis wounds, Osteoarthritis Date Acquired: 10/22/2014 06/13/2021 N/A Weeks of Treatment: 34 1 N/A Wound Status: Open Open N/A Measurements L x W x D (cm) 1x1x0.2 0x0x0 N/A Area (cm) : 0.785 0 N/A Volume (cm) : 0.157 0 N/A % Reduction in Area: 0.00% 100.00% N/A % Reduction in Volume: -98.70% 100.00% N/A Classification: Category/Stage III Category/Stage III N/A Exudate Amount: Medium None Present N/A Exudate Type: Serous N/A N/A Exudate Color: amber N/A N/A Wound Margin: Flat and Intact N/A N/A Granulation Amount: Large (67-100%) None Present (0%) N/A Granulation Quality: Red N/A N/A Necrotic Amount: Small (1-33%) None Present (0%) N/A Exposed Structures: Fat Layer (Subcutaneous Tissue): Fat Layer (Subcutaneous Tissue): N/A Yes Yes Fascia: No Fascia: No Tendon: No Tendon: No Muscle: No Muscle: No Joint: No Joint: No Bone: No Bone: No Epithelialization: Small (1-33%) Large (67-100%) N/A Treatment Notes Electronic Signature(s) Signed: 06/21/2021 4:05:40 PM By: Levora Dredge Entered By: Levora Dredge on 06/20/2021 15:43:09 Hechler, [name] ([id number]) -------------------------------------------------------------------------------- [place] Details Patient Name: MARYIAH, OLVEY Date of Service: 06/20/2021 3:00 PM Medical Record Number: [id number] Patient Account Number: [id number] Date of Birth/Sex: [birth year] (87 y.o. F) Treating RN: Levora Dredge Primary Care Lunna Vogelgesang: Viviana Simpler Other Clinician: Referring Amrutha Avera: Viviana Simpler Treating  Anyelina Claycomb/Extender: Skipper Cliche in Treatment: 3 Active Inactive Electronic Signature(s) Signed: 06/21/2021 4:05:40 PM By: Levora Dredge Entered By: Levora Dredge on 06/20/2021 15:42:47 Morriss, [name] ([id number]) -------------------------------------------------------------------------------- Pain Assessment Details Patient Name: MARCIANA, UPLINGER Date of Service: 06/20/2021 3:00 PM Medical Record Number: [id number] Patient Account Number: [id number] Date of Birth/Sex: 31-Jan-1928 (87 y.o. F) Treating RN: Levora Dredge Primary Care Mayola Mcbain: Viviana Simpler Other Clinician: Referring Chestine Belknap: Viviana Simpler Treating Meleni Delahunt/Extender: Skipper Cliche in Treatment: 34 Active Problems Location of Pain Severity and Description of Pain Patient Has Paino No Site Locations Rate the pain. Current Pain Level: 0 Pain Management and Medication Current Pain Management: Electronic Signature(s) Signed: 06/21/2021 4:05:40 PM By: Levora Dredge Entered By: Levora Dredge on 06/20/2021 15:11:32 Mangano, [name] ([id number]) -------------------------------------------------------------------------------- Patient/Caregiver Education Details Patient Name: AMARILIS, BELFLOWER Date of Service: 06/20/2021 3:00 PM Medical Record Number: [id number] Patient Account Number: [id number] Date of Birth/Gender: 1928-03-16 (87 y.o. F) Treating RN: Levora Dredge Primary Care Physician: Viviana Simpler Other Clinician: Referring Physician: Viviana Simpler Treating Physician/Extender: Skipper Cliche in Treatment: 43 Education Assessment Education Provided To: Patient and Caregiver Education Topics Provided Wound Debridement: Handouts:  Wound Debridement Methods: Explain/Verbal Responses: State content correctly Wound/Skin Impairment: Handouts: Caring for Your Ulcer Methods: Explain/Verbal Responses: State content correctly Electronic Signature(s) Signed: 06/21/2021 4:05:40 PM By:  Levora Dredge Entered By: Levora Dredge on 06/20/2021 15:55:17 Biegel, [name] ([id number]) -------------------------------------------------------------------------------- Wound Assessment Details Patient Name: [name] Date of Service: 06/20/2021 3:00 PM Medical Record Number: [id number] Patient Account Number: [id number] Date of Birth/Sex: Jan 22, 1928 (87 y.o. F) Treating RN: Levora Dredge Primary Care Jaikob Borgwardt: Viviana Simpler Other Clinician: Referring Jacynda Brunke: Viviana Simpler Treating Aneeka Bowden/Extender: Skipper Cliche in Treatment: 34 Wound Status Wound Number: 1 Primary Pressure Ulcer Etiology: Wound Location: Right, Lateral Malleolus Wound Open Wounding Event: Gradually Appeared Status: Date Acquired: 10/22/2014 Comorbid Arrhythmia, Coronary Artery Disease, Hypertension, Weeks Of Treatment: 34 History: Peripheral Arterial Disease, History of pressure wounds, Clustered Wound: No Osteoarthritis Photos Wound Measurements Length: (cm) 1 Width: (cm) 1 Depth: (cm) 0.2 Area: (cm) 0.785 Volume: (cm) 0.157 % Reduction in Area: 0% % Reduction in Volume: -98.7% Epithelialization: Small (1-33%) Wound Description Classification: Category/Stage III Wound Margin: Flat and Intact Exudate Amount: Medium Exudate Type: Serous Exudate Color: amber Foul Odor After Cleansing: No Slough/Fibrino Yes Wound Bed Granulation Amount: Large (67-100%) Exposed Structure Granulation Quality: Red Fascia Exposed: No Necrotic Amount: Small (1-33%) Fat Layer (Subcutaneous Tissue) Exposed: Yes Necrotic Quality: Adherent Slough Tendon Exposed: No Muscle Exposed: No Joint Exposed: No Bone Exposed: No Treatment Notes Wound #1 (Malleolus) Wound Laterality: Right, Lateral Cleanser Soap and Water Discharge Instruction: Gently cleanse wound with antibacterial soap, rinse and pat dry prior to dressing wounds KASIDI, SHANKER ([id number]) Peri-Wound Care Topical Primary  Dressing Prisma 4.34 (in) Discharge Instruction: Moisten w/normal saline or sterile water; Cover wound as directed. Do not remove from wound bed. Secondary Dressing ABD Pad 5x9 (in/in) Discharge Instruction: Cover with ABD pad Secured With Compression Wrap Profore Lite LF 3 Multilayer Compression Bandaging System Discharge Instruction: Apply 3 multi-layer wrap as prescribed. Compression Stockings Add-Ons Electronic Signature(s) Signed: 06/21/2021 4:05:40 PM By: Levora Dredge Entered By: Levora Dredge on 06/20/2021 15:19:29 Ganson, [name] ([id number]) -------------------------------------------------------------------------------- Wound Assessment Details Patient Name: JAICE, LAGUE Date of Service: 06/20/2021 3:00 PM Medical Record Number: [id number] Patient Account Number: [id number] Date of Birth/Sex: 12/23/1927 (87 y.o. F) Treating RN: Levora Dredge Primary Care Tinia Oravec: Viviana Simpler Other Clinician: Referring Aynsley Fleet: Viviana Simpler Treating Dajuan Turnley/Extender: Skipper Cliche in Treatment: 34 Wound Status Wound Number: 5 Primary Pressure Ulcer Etiology: Wound Location: Left Calcaneus Wound Open Wounding Event: Gradually Appeared Status: Date Acquired: 06/13/2021 Comorbid Arrhythmia, Coronary Artery Disease, Hypertension, Weeks Of Treatment: 1 History: Peripheral Arterial Disease, History of pressure wounds, Clustered Wound: No Osteoarthritis Photos Wound Measurements Length: (cm) 0 Width: (cm) 0 Depth: (cm) 0 Area: (cm) 0 Volume: (cm) 0 % Reduction in Area: 100% % Reduction in Volume: 100% Epithelialization: Large (67-100%) Tunneling: No Undermining: No Wound Description Classification: Category/Stage III Exudate Amount: None Present Foul Odor After Cleansing: No Slough/Fibrino No Wound Bed Granulation Amount: None Present (0%) Exposed Structure Necrotic Amount: None Present (0%) Fascia Exposed: No Fat Layer (Subcutaneous Tissue)  Exposed: Yes Tendon Exposed: No Muscle Exposed: No Joint Exposed: No Bone Exposed: No Electronic Signature(s) Signed: 06/21/2021 4:05:40 PM By: Levora Dredge Entered By: Levora Dredge on 06/20/2021 15:42:31 Fesler, [name] ([id number]) -------------------------------------------------------------------------------- Vitals Details Patient Name: [name] Date of Service: 06/20/2021 3:00 PM Medical Record Number: [id number] Patient Account Number: [id number] Date of Birth/Sex: 1927-11-04 (87 y.o. F) Treating RN: Levora Dredge Primary Care Jamere Stidham: Viviana Simpler Other Clinician: Referring Sophia Sperry:  Viviana Simpler Treating Amoria Mclees/Extender: Jeri Cos Weeks in Treatment: 34 Vital Signs Time Taken: 14:59 Temperature (F): 97.4 Height (in): 66 Pulse (bpm): 67 Weight (lbs): 76 Respiratory Rate (breaths/min): 18 Body Mass Index (BMI): 12.3 Blood Pressure (mmHg): 149/77 Reference Range: 80 - 120 mg / dl Electronic Signature(s) Signed: 06/21/2021 4:05:40 PM By: Levora Dredge Entered By: Levora Dredge on 06/20/2021 15:10:47

## 2021-06-20 NOTE — Progress Notes (Addendum)
Elizabeth, Downs ([id number]) Visit Report for 06/20/2021 Chief Complaint Document Details Patient Name: [name], [name]. Date of Service: 06/20/2021 3:00 PM Medical Record Number: [id number] Patient Account Number: [id number] Date of Birth/Sex: [birth year] (87 y.o. F) Treating RN: Cornell Barman Primary Care Provider: Viviana Simpler Other Clinician: Referring Provider: Viviana Simpler Treating Provider/Extender: Skipper Cliche in Treatment: 22 Information Obtained from: Patient Chief Complaint Pressure ulcer right ankle and left heel Electronic Signature(s) Signed: 06/20/2021 2:55:29 PM By: Worthy Keeler PA-C Entered By: Worthy Keeler on 06/20/2021 14:55:28 Magana, [name] ([id number]) -------------------------------------------------------------------------------- HPI Details Patient Name: Elizabeth, Downs Date of Service: 06/20/2021 3:00 PM Medical Record Number: [id number] Patient Account Number: [id number] Date of Birth/Sex: 22-Aug-1927 (87 y.o. F) Treating RN: Cornell Barman Primary Care Provider: Viviana Simpler Other Clinician: Referring Provider: Viviana Simpler Treating Provider/Extender: Skipper Cliche in Treatment: 34 History of Present Illness HPI Description: 10/21/2020 upon evaluation today patient presents for initial inspection here in our clinic concerning issues that she has been having quite some time in regard to her ankle and since she has been in the hospital with regard to the heel. The ankle in fact has been 5-6 years at least I am told. With that being said the heel ulcer occurred when she was in the hospital in December for hip surgery when she fractured her hip. She also was in the hospital Tuesday for altered mental status. Really there was nothing that was identified as the cause for this. She did have a. Fortunately there does not appear to be any signs of infection she tells me that she has had she believes arterial studies At [facility] clinic I could not  find Those studies at this point. Nonetheless I will continue to look and see what I can find before Then. The patient also has a skin tear on her arm this occurred more recently when she bumped this at home. The patient does have a history of hypertension, coronary artery disease, and peripheral vascular disease stated. 10/28/2020 I was able to find the patient's chart currently which shows that she did have an arterial study performed in May 2019. This showed that she had a abnormal right toe brachial index and a normal left toe brachial index. She was noncompressible as far as ABIs were concerned. She did appear to have triphasic flow at that time. Unfortunately the wound that is commented on in the report that I printed off and read mentions the same wound on the ankle that we are still dealing with at this point. Unfortunately this has not healed. And its been quite sometime about 3 years now. Fortunately there does not appear to be any signs of active infection systemically at this point. I think that the patient has done well with the Santyl over the past week which is good news. Patient's caregiver which is her daughter-in-law is concerned about the fact that she really does not feel qualified to be able to change the dressings and take care of this issue. There does not appear to be any signs of anything untoward going on at this point. I think she is done a great job applying the [employer] and I think that has done a great job for the patient is for soften up some of the necrotic tissue. With that being said I think the Xeroform on the arm also has done excellent. In general I am very pleased with where we stand. And I told the patient's daughter-in- law as well that  I also feel like she has done a great job over the past week taking care of her mother. 11/11/2020 upon evaluation today patient appears to be doing well with regard to her wounds. She has been tolerating the dressing changes without  complication her daughters been applying the Santyl which has done a great job. Ho with that being said I think now that we have good arterial study showing we can go ahead and proceed with sharp debridement at this point. 11/25/2020 upon evaluation today patient appears to be doing well with regard to her wounds. The Santyl has really helped to clean things up and overall she is doing quite excellent at this point. Fortunately there does not appear to be any signs of infection which is great news and in general extremely pleased. I do believe some debridement is in order and hopefully will be able to get her into a collagen dressing after this. 12/23/2020 upon evaluation today patient appears to be doing well with regard to her wound all things considered. Unfortunately it does sound like her daughter decided to let this air out because she felt like it was getting so wet. It sounds like she got border gauze dressings instead of border foam therefore there is really Nothing to catch the excess drainage which I think is the problem they were worried about the smell. Fortunately there does not appear to be any signs of active infection which is great news. Nonetheless I do not think we want to leave this just open to air. 01/13/2021 upon evaluation today patient's wounds actually appear to be about as good as have seen since have been taking care of her. Fortunately there does not appear to be any evidence of infection which is great and overall the biggest issue I see is that of fluid buildup. I think we need to do something to try to help manage this. I think if we can control her edema we get the wounds to heal more effectively. 01/27/2021 upon evaluation today patient appears to be doing well in regard to the wounds on her right lateral malleolus as well as the left heel and the right ischial tuberosity is unfortunately a new area that has arisen since we last saw her. She tells me currently that that is  from sitting too much. With that being said this actually appears to be doing the worst of anything so far that I see today. Fortunately there does not appear to be any signs of infection this is at least good news. Compression wrap seem to be doing excellent for her as far as the legs are concerned. 02/03/2021 upon evaluation today patient appears to be doing well with regard to all of her wounds. She has been tolerating the dressing changes without complication. Fortunately there is no signs of active infection at this time. No fevers, chills, nausea, vomiting, or diarrhea. 02/10/2021 upon evaluation today patient appears to be doing well with regard to her wounds. With that being said there is some slight evidence of hypergranulation in regard to the right ankle in particular and a little bit in regard to the left heel. I think Hydrofera Blue might be a better option to go to at this point based on what I am seeing especially since both of these wounds in particular seem to be a little bit stalled. I discussed that with the patient and her daughter today. With regard to the hip this is doing great with the collagen I think will get  very close to complete closure I recommend we continue with the collagen. 02/17/2021 upon evaluation today patient appears to be doing excellent in regard to her heel ulcers. She has been tolerating the dressing changes without complication and overall I am extremely pleased with where things stand today. There does not appear to be any signs of active infection which is great news. Overall I may think that we are headed in the proper direction based on what I am seeing. 02/24/2021 upon evaluation today patient's wounds actually are showing signs of improvement in regard to her heels both are doing awesome. In regard to her hip location this is actually reopened after being closed last week and to be perfectly honest I am more concerned here about the fact that pressures  causing this issue I do not think it has anything to do with her not having the collagen in place again it was completely healed last week there was no reason for the collagen to be there. 03/03/2021 upon evaluation today patient appears to be doing well with regard to her wounds. Everything is showing signs of improvement and BERNARDINE, LANGWORTHY. ([id number]) doing some much better as far as the overall size of the wound. Fortunately I think we are headed in the right direction. 03/10/2021 upon evaluation today patient appears to be doing well with regard to her wound. She is tolerating the dressing changes without complication. The left heel pretty much appears to be almost completely healed although I think it is probably can be 1 more week before I can call this 100% slough. The right ankle is significantly improved with that being said its not completely closed. With regard to the right hip this is completely closed. Overall I am very pleased with where things stand I think were getting very close to complete resolution. I discussed all this with the patient and her daughter-in-law today. 03/17/2021 upon evaluation today patient appears to be doing well with regard to her wounds. Fortunately there does not appear to be any signs of active infection which is great news and overall very pleased with where the patient stands today. I am in general thankful that overall she is making great progress here. There is good to be a little bit of debridement here to clear away some of the necrotic debris today on both wound locations. 03/24/2021 upon evaluation today patient appears to be doing excellent in regard to her wound. She has been tolerating the dressing changes without complication. Fortunately there does not appear to be any evidence of infection the left foot is completely healed this is great news. On the right at the ankle region this is still open though I do not think the alginate did quite as well  as I was hoping as far as getting this dried out. I think that we may just want to switch back to the [facility] which has done well up to this point. I was just hopeful this would wrap things up completely for [place]. 03/31/2021 upon evaluation today patient appears to be doing well with regard to her wound. This is on the right side which appears to be doing better than last week I think is definitely measuring smaller and this is great news. Overall I am very pleased with where we stand today. I do believe the [facility] is doing better for her. 04/14/2021 upon evaluation today patient appears to be doing well with regard to her wound. I am very pleased with the way it  stands I think that she is headed in the right direction. Unfortunately she is having issues with the Tubigrip neither she nor her daughter-in-law who is the primary caregiver can get this on. Subsequently that means that they have been having a very difficult time with complying with the necessary things for this left leg. With that being said I am really not sure what to do to help in that regard. We can always try a bigger that is larger size Tubigrip but at the same time that means that she may not get as good compression and it may not keep things under control the only way to know is to try. 04/21/2021 upon evaluation today patient appears to be doing well in regard to her wound. She has been tolerating the dressing changes without complication and in fact the right ankle ulcer is actually showing signs of excellent improvement I am very pleased with where things stand today. No fevers, chills, nausea, vomiting, or diarrhea. 04/28/2021 upon evaluation today patient appears to be doing well with regard to her ankle ulcer. This is actually showing signs of good improvement which is great news and overall very pleased with where we stand today. No fevers, chills, nausea, vomiting, or diarrhea. 05/05/2021 upon evaluation today  patient's wound is actually showing signs of significant improvement and overall I am extremely pleased with where we stand today. There does not appear to be any signs of active infection which is great news and overall I am extremely pleased with where we stand at this point. No fevers, chills, nausea, vomiting, or diarrhea. 05/12/2021 upon evaluation today patient appears to be doing okay in regard to her wound is measuring a little bit smaller today that is good news. With that being said she still continues to have significant issues here with an open wound on the lateral aspect of her ankle. Fortunately there does not appear to be any signs of active infection systemically which is great news. No fevers, chills, nausea, vomiting, or diarrhea. 06/13/2021 upon evaluation today patient unfortunately continues to have issues with her right leg. Specifically the ankle region where there is an open wound. Previously the wound on the left leg had completely closed. Nonetheless this left heel has reopened as well. I am going to perform some debridement in regard to the heel ulcer appears to be a fluid-filled blister underneath this area. She has not been here for such a long time due to the fact that she was very sick and they were not able to bring her out. That is the reason we have not seen her in over the past month. 06/20/2021 upon evaluation patient's wounds currently are doing well on the left heel this appears to be healed. There does not appear to be any signs of drainage at this time which is great news. No fevers, chills, nausea, vomiting, or diarrhea. On the right heel there is still an open wound but I feel like we are showing some signs of improvement this is still good to take a bit of time. Electronic Signature(s) Signed: 06/20/2021 4:32:31 PM By: Worthy Keeler PA-C Entered By: Worthy Keeler on 06/20/2021 16:32:31 Cortopassi, [name]  ([id number]) -------------------------------------------------------------------------------- Physical Exam Details Patient Name: ALISAN, DOKES Date of Service: 06/20/2021 3:00 PM Medical Record Number: [id number] Patient Account Number: [id number] Date of Birth/Sex: Sep 10, 1927 (87 y.o. F) Treating RN: Cornell Barman Primary Care Provider: Viviana Simpler Other Clinician: Referring Provider: Viviana Simpler Treating Provider/Extender: Skipper Cliche in Treatment:  34 Constitutional Thin and well-hydrated in no acute distress. Respiratory normal breathing without difficulty. Psychiatric this patient is able to make decisions and demonstrates good insight into disease process. Alert and Oriented x 3. pleasant and cooperative. Notes Upon inspection patient's right heel again still show some signs of an open wound I think still a compression wrap is the right way to go. Fortunately I do not see any evidence of infection at this time. Electronic Signature(s) Signed: 06/20/2021 4:32:45 PM By: Worthy Keeler PA-C Entered By: Worthy Keeler on 06/20/2021 16:32:45 Gibbons, [name] ([id number]) -------------------------------------------------------------------------------- Physician Orders Details Patient Name: CHAVON, LUCARELLI Date of Service: 06/20/2021 3:00 PM Medical Record Number: [id number] Patient Account Number: [id number] Date of Birth/Sex: 13-May-1928 (87 y.o. F) Treating RN: Levora Dredge Primary Care Provider: Viviana Simpler Other Clinician: Referring Provider: Viviana Simpler Treating Provider/Extender: Skipper Cliche in Treatment: 54 Verbal / Phone Orders: No Diagnosis Coding ICD-10 Coding Code Description L89.513 Pressure ulcer of right ankle, stage 3 I73.89 Other specified peripheral vascular diseases L89.623 Pressure ulcer of left heel, stage 3 I10 Essential (primary) hypertension I25.10 Atherosclerotic heart disease of native coronary artery without angina  pectoris Follow-up Appointments o Return Appointment in 1 week. o Nurse Visit as needed Bathing/ Shower/ Hygiene o May shower with wound dressing protected with water repellent cover or cast protector. o No tub bath. Anesthetic (Use 'Patient Medications' Section for Anesthetic Order Entry) o Lidocaine applied to wound bed Edema Control - Lymphedema / Segmental Compressive Device / Other Bilateral Lower Extremities o Optional: One layer of unna paste to top of compression wrap (to act as an anchor). o 3 Layer Compression System for Lymphedema. - left side abd pad over calc o Elevate legs to the level of the heart and pump ankles as often as possible o Elevate leg(s) parallel to the floor when sitting. o DO YOUR BEST to sleep in the bed at night. DO NOT sleep in your recliner. Long hours of sitting in a recliner leads to swelling of the legs and/or potential wounds on your backside. Additional Orders / Instructions o Follow Nutritious Diet and Increase Protein Intake Wound Treatment Wound #1 - Malleolus Wound Laterality: Right, Lateral Cleanser: Soap and Water 1 x Per Week/15 Days Discharge Instructions: Gently cleanse wound with antibacterial soap, rinse and pat dry prior to dressing wounds Primary Dressing: Prisma 4.34 (in) 1 x Per Week/15 Days Discharge Instructions: Moisten w/normal saline or sterile water; Cover wound as directed. Do not remove from wound bed. Secondary Dressing: ABD Pad 5x9 (in/in) 1 x Per Week/15 Days Discharge Instructions: Cover with ABD pad Compression Wrap: Profore Lite LF 3 Multilayer Compression Bandaging System 1 x Per Week/15 Days Discharge Instructions: Apply 3 multi-layer wrap as prescribed. Electronic Signature(s) Signed: 06/20/2021 5:37:18 PM By: Worthy Keeler PA-C Signed: 06/21/2021 4:05:40 PM By: Levora Dredge Entered By: Levora Dredge on 06/20/2021 15:51:55 Emel, [name]  ([id number]) -------------------------------------------------------------------------------- Problem List Details Patient Name: CAMIE, HAUSS Date of Service: 06/20/2021 3:00 PM Medical Record Number: [id number] Patient Account Number: [id number] Date of Birth/Sex: Jul 01, 1928 (87 y.o. F) Treating RN: Cornell Barman Primary Care Provider: Viviana Simpler Other Clinician: Referring Provider: Viviana Simpler Treating Provider/Extender: Skipper Cliche in Treatment: 44 Active Problems ICD-10 Encounter Code Description Active Date MDM Diagnosis L89.513 Pressure ulcer of right ankle, stage 3 10/21/2020 No Yes I73.89 Other specified peripheral vascular diseases 10/21/2020 No Yes L89.623 Pressure ulcer of left heel, stage 3 10/21/2020 No Yes I10 Essential (primary) hypertension 10/21/2020 No  Yes I25.10 Atherosclerotic heart disease of native coronary artery without angina 10/21/2020 No Yes pectoris Inactive Problems Resolved Problems ICD-10 Code Description Active Date Resolved Date S51.802A Unspecified open wound of left forearm, initial encounter 10/21/2020 10/21/2020 L89.312 Pressure ulcer of right buttock, stage 2 01/27/2021 01/27/2021 Electronic Signature(s) Signed: 06/20/2021 2:55:13 PM By: Worthy Keeler PA-C Entered By: Worthy Keeler on 06/20/2021 14:55:13 Carol, [name] ([id number]) -------------------------------------------------------------------------------- Progress Note Details Patient Name: [name] Date of Service: 06/20/2021 3:00 PM Medical Record Number: [id number] Patient Account Number: [id number] Date of Birth/Sex: [birth year] (87 y.o. F) Treating RN: Cornell Barman Primary Care Provider: Viviana Simpler Other Clinician: Referring Provider: Viviana Simpler Treating Provider/Extender: Skipper Cliche in Treatment: 34 Subjective Chief Complaint Information obtained from Patient Pressure ulcer right ankle and left heel History of Present Illness  (HPI) 10/21/2020 upon evaluation today patient presents for initial inspection here in our clinic concerning issues that she has been having quite some time in regard to her ankle and since she has been in the hospital with regard to the heel. The ankle in fact has been 5-6 years at least I am told. With that being said the heel ulcer occurred when she was in the hospital in December for hip surgery when she fractured her hip. She also was in the hospital Tuesday for altered mental status. Really there was nothing that was identified as the cause for this. She did have a. Fortunately there does not appear to be any signs of infection she tells me that she has had she believes arterial studies At [facility] clinic I could not find Those studies at this point. Nonetheless I will continue to look and see what I can find before Then. The patient also has a skin tear on her arm this occurred more recently when she bumped this at home. The patient does have a history of hypertension, coronary artery disease, and peripheral vascular disease stated. 10/28/2020 I was able to find the patient's chart currently which shows that she did have an arterial study performed in May 2019. This showed that she had a abnormal right toe brachial index and a normal left toe brachial index. She was noncompressible as far as ABIs were concerned. She did appear to have triphasic flow at that time. Unfortunately the wound that is commented on in the report that I printed off and read mentions the same wound on the ankle that we are still dealing with at this point. Unfortunately this has not healed. And its been quite sometime about 3 years now. Fortunately there does not appear to be any signs of active infection systemically at this point. I think that the patient has done well with the Santyl over the past week which is good news. Patient's caregiver which is her daughter-in-law is concerned about the fact that she really does  not feel qualified to be able to change the dressings and take care of this issue. There does not appear to be any signs of anything untoward going on at this point. I think she is done a great job applying the [employer] and I think that has done a great job for the patient is for soften up some of the necrotic tissue. With that being said I think the Xeroform on the arm also has done excellent. In general I am very pleased with where we stand. And I told the patient's daughter-in- law as well that I also feel like she has done a great job over the  past week taking care of her mother. 11/11/2020 upon evaluation today patient appears to be doing well with regard to her wounds. She has been tolerating the dressing changes without complication her daughters been applying the Santyl which has done a great job. Ho with that being said I think now that we have good arterial study showing we can go ahead and proceed with sharp debridement at this point. 11/25/2020 upon evaluation today patient appears to be doing well with regard to her wounds. The Santyl has really helped to clean things up and overall she is doing quite excellent at this point. Fortunately there does not appear to be any signs of infection which is great news and in general extremely pleased. I do believe some debridement is in order and hopefully will be able to get her into a collagen dressing after this. 12/23/2020 upon evaluation today patient appears to be doing well with regard to her wound all things considered. Unfortunately it does sound like her daughter decided to let this air out because she felt like it was getting so wet. It sounds like she got border gauze dressings instead of border foam therefore there is really Nothing to catch the excess drainage which I think is the problem they were worried about the smell. Fortunately there does not appear to be any signs of active infection which is great news. Nonetheless I do not think we  want to leave this just open to air. 01/13/2021 upon evaluation today patient's wounds actually appear to be about as good as have seen since have been taking care of her. Fortunately there does not appear to be any evidence of infection which is great and overall the biggest issue I see is that of fluid buildup. I think we need to do something to try to help manage this. I think if we can control her edema we get the wounds to heal more effectively. 01/27/2021 upon evaluation today patient appears to be doing well in regard to the wounds on her right lateral malleolus as well as the left heel and the right ischial tuberosity is unfortunately a new area that has arisen since we last saw her. She tells me currently that that is from sitting too much. With that being said this actually appears to be doing the worst of anything so far that I see today. Fortunately there does not appear to be any signs of infection this is at least good news. Compression wrap seem to be doing excellent for her as far as the legs are concerned. 02/03/2021 upon evaluation today patient appears to be doing well with regard to all of her wounds. She has been tolerating the dressing changes without complication. Fortunately there is no signs of active infection at this time. No fevers, chills, nausea, vomiting, or diarrhea. 02/10/2021 upon evaluation today patient appears to be doing well with regard to her wounds. With that being said there is some slight evidence of hypergranulation in regard to the right ankle in particular and a little bit in regard to the left heel. I think Hydrofera Blue might be a better option to go to at this point based on what I am seeing especially since both of these wounds in particular seem to be a little bit stalled. I discussed that with the patient and her daughter today. With regard to the hip this is doing great with the collagen I think will get very close to complete closure I recommend we  continue with the collagen.  02/17/2021 upon evaluation today patient appears to be doing excellent in regard to her heel ulcers. She has been tolerating the dressing changes without complication and overall I am extremely pleased with where things stand today. There does not appear to be any signs of active infection which is great news. Overall I may think that we are headed in the proper direction based on what I am seeing. 02/24/2021 upon evaluation today patient's wounds actually are showing signs of improvement in regard to her heels both are doing awesome. In [place], [place] ([id number]) regard to her hip location this is actually reopened after being closed last week and to be perfectly honest I am more concerned here about the fact that pressures causing this issue I do not think it has anything to do with her not having the collagen in place again it was completely healed last week there was no reason for the collagen to be there. 03/03/2021 upon evaluation today patient appears to be doing well with regard to her wounds. Everything is showing signs of improvement and doing some much better as far as the overall size of the wound. Fortunately I think we are headed in the right direction. 03/10/2021 upon evaluation today patient appears to be doing well with regard to her wound. She is tolerating the dressing changes without complication. The left heel pretty much appears to be almost completely healed although I think it is probably can be 1 more week before I can call this 100% slough. The right ankle is significantly improved with that being said its not completely closed. With regard to the right hip this is completely closed. Overall I am very pleased with where things stand I think were getting very close to complete resolution. I discussed all this with the patient and her daughter-in-law today. 03/17/2021 upon evaluation today patient appears to be doing well with regard to her wounds.  Fortunately there does not appear to be any signs of active infection which is great news and overall very pleased with where the patient stands today. I am in general thankful that overall she is making great progress here. There is good to be a little bit of debridement here to clear away some of the necrotic debris today on both wound locations. 03/24/2021 upon evaluation today patient appears to be doing excellent in regard to her wound. She has been tolerating the dressing changes without complication. Fortunately there does not appear to be any evidence of infection the left foot is completely healed this is great news. On the right at the ankle region this is still open though I do not think the alginate did quite as well as I was hoping as far as getting this dried out. I think that we may just want to switch back to the [facility] which has done well up to this point. I was just hopeful this would wrap things up completely for [place]. 03/31/2021 upon evaluation today patient appears to be doing well with regard to her wound. This is on the right side which appears to be doing better than last week I think is definitely measuring smaller and this is great news. Overall I am very pleased with where we stand today. I do believe the [facility] is doing better for her. 04/14/2021 upon evaluation today patient appears to be doing well with regard to her wound. I am very pleased with the way it stands I think that she is headed in the right direction. Unfortunately she  is having issues with the Tubigrip neither she nor her daughter-in-law who is the primary caregiver can get this on. Subsequently that means that they have been having a very difficult time with complying with the necessary things for this left leg. With that being said I am really not sure what to do to help in that regard. We can always try a bigger that is larger size Tubigrip but at the same time that means that she may not get  as good compression and it may not keep things under control the only way to know is to try. 04/21/2021 upon evaluation today patient appears to be doing well in regard to her wound. She has been tolerating the dressing changes without complication and in fact the right ankle ulcer is actually showing signs of excellent improvement I am very pleased with where things stand today. No fevers, chills, nausea, vomiting, or diarrhea. 04/28/2021 upon evaluation today patient appears to be doing well with regard to her ankle ulcer. This is actually showing signs of good improvement which is great news and overall very pleased with where we stand today. No fevers, chills, nausea, vomiting, or diarrhea. 05/05/2021 upon evaluation today patient's wound is actually showing signs of significant improvement and overall I am extremely pleased with where we stand today. There does not appear to be any signs of active infection which is great news and overall I am extremely pleased with where we stand at this point. No fevers, chills, nausea, vomiting, or diarrhea. 05/12/2021 upon evaluation today patient appears to be doing okay in regard to her wound is measuring a little bit smaller today that is good news. With that being said she still continues to have significant issues here with an open wound on the lateral aspect of her ankle. Fortunately there does not appear to be any signs of active infection systemically which is great news. No fevers, chills, nausea, vomiting, or diarrhea. 06/13/2021 upon evaluation today patient unfortunately continues to have issues with her right leg. Specifically the ankle region where there is an open wound. Previously the wound on the left leg had completely closed. Nonetheless this left heel has reopened as well. I am going to perform some debridement in regard to the heel ulcer appears to be a fluid-filled blister underneath this area. She has not been here for such a long  time due to the fact that she was very sick and they were not able to bring her out. That is the reason we have not seen her in over the past month. 06/20/2021 upon evaluation patient's wounds currently are doing well on the left heel this appears to be healed. There does not appear to be any signs of drainage at this time which is great news. No fevers, chills, nausea, vomiting, or diarrhea. On the right heel there is still an open wound but I feel like we are showing some signs of improvement this is still good to take a bit of time. Objective Constitutional Thin and well-hydrated in no acute distress. Vitals Time Taken: 2:59 PM, Height: 66 in, Weight: 76 lbs, BMI: 12.3, Temperature: 97.4 F, Pulse: 67 bpm, Respiratory Rate: 18 breaths/min, Blood Pressure: 149/77 mmHg. Respiratory normal breathing without difficulty. EMARIE, PAUL ([id number]) Psychiatric this patient is able to make decisions and demonstrates good insight into disease process. Alert and Oriented x 3. pleasant and cooperative. General Notes: Upon inspection patient's right heel again still show some signs of an open wound I think still a  compression wrap is the right way to go. Fortunately I do not see any evidence of infection at this time. Integumentary (Hair, Skin) Wound #1 status is Open. Original cause of wound was Gradually Appeared. The date acquired was: 10/22/2014. The wound has been in treatment 34 weeks. The wound is located on the Right,Lateral Malleolus. The wound measures 1cm length x 1cm width x 0.2cm depth; 0.785cm^2 area and 0.157cm^3 volume. There is Fat Layer (Subcutaneous Tissue) exposed. There is a medium amount of serous drainage noted. The wound margin is flat and intact. There is large (67-100%) red granulation within the wound bed. There is a small (1-33%) amount of necrotic tissue within the wound bed including Adherent Slough. Wound #5 status is Open. Original cause of wound was Gradually  Appeared. The date acquired was: 06/13/2021. The wound has been in treatment 1 weeks. The wound is located on the Left Calcaneus. The wound measures 0cm length x 0cm width x 0cm depth; 0cm^2 area and 0cm^3 volume. There is Fat Layer (Subcutaneous Tissue) exposed. There is no tunneling or undermining noted. There is a none present amount of drainage noted. There is no granulation within the wound bed. There is no necrotic tissue within the wound bed. Assessment Active Problems ICD-10 Pressure ulcer of right ankle, stage 3 Other specified peripheral vascular diseases Pressure ulcer of left heel, stage 3 Essential (primary) hypertension Atherosclerotic heart disease of native coronary artery without angina pectoris Procedures Wound #1 Pre-procedure diagnosis of Wound #1 is a Pressure Ulcer located on the Right,Lateral Malleolus . There was a Three Layer Compression Therapy Procedure by Levora Dredge, RN. Post procedure Diagnosis Wound #1: Same as Pre-Procedure There was a Three Layer Compression Therapy Procedure by Levora Dredge, RN. Post procedure Diagnosis Wound #: Same as Pre-Procedure Plan Follow-up Appointments: Return Appointment in 1 week. Nurse Visit as needed Bathing/ Shower/ Hygiene: May shower with wound dressing protected with water repellent cover or cast protector. No tub bath. Anesthetic (Use 'Patient Medications' Section for Anesthetic Order Entry): Lidocaine applied to wound bed Edema Control - Lymphedema / Segmental Compressive Device / Other: Optional: One layer of unna paste to top of compression wrap (to act as an anchor). 3 Layer Compression System for Lymphedema. - left side abd pad over calc Elevate legs to the level of the heart and pump ankles as often as possible Elevate leg(s) parallel to the floor when sitting. DO YOUR BEST to sleep in the bed at night. DO NOT sleep in your recliner. Long hours of sitting in a recliner leads to swelling of the legs  and/or potential wounds on your backside. Additional Orders / Instructions: Follow Nutritious Diet and Increase Protein Intake WOUND #1: - Malleolus Wound Laterality: Right, Lateral Cleanser: Soap and Water 1 x Per Week/15 Days ASHEA, WINIARSKI ([id number]) Discharge Instructions: Gently cleanse wound with antibacterial soap, rinse and pat dry prior to dressing wounds Primary Dressing: Prisma 4.34 (in) 1 x Per Week/15 Days Discharge Instructions: Moisten w/normal saline or sterile water; Cover wound as directed. Do not remove from wound bed. Secondary Dressing: ABD Pad 5x9 (in/in) 1 x Per Week/15 Days Discharge Instructions: Cover with ABD pad Compression Wrap: Profore Lite LF 3 Multilayer Compression Bandaging System 1 x Per Week/15 Days Discharge Instructions: Apply 3 multi-layer wrap as prescribed. 1. I would recommend that we going to continue with the wound care measures as before and the patient is in agreement the plan. This includes the use of the silver collagen dressing which I think  is doing a good job. 2. I am also can recommend an ABD pad followed by 3 layer compression wrap to help with edema control. This is on the right heel. 3. In the left heel this appears to be healed although we will get a go ahead and close this out we will go ahead as well and use an ABD pad and a 3 layer compression wrap to keep the edema under control here. My hope is this will allow this to toughen up before we get her back into the Tubigrip. We will see patient back for reevaluation in 1 week here in the clinic. If anything worsens or changes patient will contact our office for additional recommendations. Electronic Signature(s) Signed: 06/20/2021 4:33:44 PM By: Worthy Keeler PA-C Entered By: Worthy Keeler on 06/20/2021 16:33:44 Pawelski, [name] ([id number]) -------------------------------------------------------------------------------- SuperBill Details Patient Name: DELILIAH, SPRANGER Date  of Service: 06/20/2021 Medical Record Number: [id number] Patient Account Number: [id number] Date of Birth/Sex: 05/15/1928 (87 y.o. F) Treating RN: Levora Dredge Primary Care Provider: Viviana Simpler Other Clinician: Referring Provider: Viviana Simpler Treating Provider/Extender: Skipper Cliche in Treatment: 34 Diagnosis Coding ICD-10 Codes Code Description [phone] Pressure ulcer of right ankle, stage 3 I73.89 Other specified peripheral vascular diseases L89.623 Pressure ulcer of left heel, stage 3 I10 Essential (primary) hypertension I25.10 Atherosclerotic heart disease of native coronary artery without angina pectoris Facility Procedures CPT4: Description Modifier Quantity Code [id number] - WOUND CARE VISIT-LEV 2 EST PT 1 CPT4: [id number] BILATERAL: Application of multi-layer venous compression system; leg (below knee), including 1 ankle and foot. Physician Procedures CPT4 Code: [id number] Description: 73532 - WC PHYS LEVEL 3 - EST PT Modifier: Quantity: 1 CPT4 Code: Description: ICD-10 Diagnosis Description L89.513 Pressure ulcer of right ankle, stage 3 I73.89 Other specified peripheral vascular diseases L89.623 Pressure ulcer of left heel, stage 3 I10 Essential (primary) hypertension Modifier: Quantity: Electronic Signature(s) Signed: 06/20/2021 4:34:08 PM By: Worthy Keeler PA-C Entered By: Worthy Keeler on 06/20/2021 16:34:07

## 2021-06-26 ENCOUNTER — Ambulatory Visit: Payer: PPO | Admitting: Internal Medicine

## 2021-06-27 ENCOUNTER — Other Ambulatory Visit: Payer: Self-pay

## 2021-06-27 ENCOUNTER — Encounter: Payer: PPO | Admitting: Physician Assistant

## 2021-06-27 ENCOUNTER — Ambulatory Visit: Payer: PPO | Admitting: Physician Assistant

## 2021-06-27 DIAGNOSIS — L89513 Pressure ulcer of right ankle, stage 3: Secondary | ICD-10-CM | POA: Diagnosis not present

## 2021-06-27 DIAGNOSIS — L89623 Pressure ulcer of left heel, stage 3: Secondary | ICD-10-CM | POA: Diagnosis not present

## 2021-06-27 NOTE — Progress Notes (Signed)
[name], [name] ([id number]) Visit Report for 06/27/2021 Arrival Information Details Patient Name: [name], [name] Date of Service: 06/27/2021 2:30 PM Medical Record Number: [id number] Patient Account Number: [id number] Date of Birth/Sex: [birth year] (87 y.o. F) Treating RN: Donnamarie Poag Primary Care Brinley Treanor: Viviana Simpler Other Clinician: Referring Byrdie Miyazaki: Viviana Simpler Treating Angline Schweigert/Extender: Skipper Cliche in Treatment: 69 Visit Information History Since Last Visit Added or deleted any medications: No Patient Arrived: [name] Had a fall or experienced change in No Arrival Time: 14:20 activities of daily living that may affect Accompanied By: daughter in law risk of falls: Transfer Assistance: None Hospitalized since last visit: No Patient Identification Verified: Yes Has Dressing in Place as Prescribed: Yes Secondary Verification Process Completed: Yes Has Compression in Place as Prescribed: Yes Patient Requires Transmission-Based Precautions: No Pain Present Now: No Patient Has Alerts: Yes Patient Alerts: NOT DIABETIC TBI left .77 TBI right .76 Electronic Signature(s) Signed: 06/27/2021 4:09:47 PM By: Donnamarie Poag Entered By: Donnamarie Poag on 06/27/2021 14:23:42 [name], [name] ([id number]) -------------------------------------------------------------------------------- Compression Therapy Details Patient Name: Elizabeth, Downs Date of Service: 06/27/2021 2:30 PM Medical Record Number: [id number] Patient Account Number: [id number] Date of Birth/Sex: 09-Mar-1928 (87 y.o. F) Treating RN: Donnamarie Poag Primary Care Jontrell Bushong: Viviana Simpler Other Clinician: Referring Jamell Laymon: Viviana Simpler Treating Ambry Dix/Extender: Skipper Cliche in Treatment: 35 Compression Therapy Performed for Wound Assessment: Wound #1 Right,Lateral Malleolus Performed By: Junius Argyle, RN Compression Type: Three Layer Post Procedure Diagnosis Same as  Pre-procedure Electronic Signature(s) Signed: 06/27/2021 4:09:47 PM By: Donnamarie Poag Entered By: Donnamarie Poag on 06/27/2021 14:50:48 [name], [name] ([id number]) -------------------------------------------------------------------------------- Compression Therapy Details Patient Name: Elizabeth, Downs Date of Service: 06/27/2021 2:30 PM Medical Record Number: [id number] Patient Account Number: [id number] Date of Birth/Sex: 12-12-1927 (87 y.o. F) Treating RN: Donnamarie Poag Primary Care Jackalyn Haith: Viviana Simpler Other Clinician: Referring Emon Miggins: Viviana Simpler Treating Erikka Follmer/Extender: Skipper Cliche in Treatment: 35 Compression Therapy Performed for Wound Assessment: Non-Wound Location Performed By: Clinician Donnamarie Poag, RN Compression Type: Three Layer Location: Lower Extremity, Left Post Procedure Diagnosis Same as Pre-procedure Electronic Signature(s) Signed: 06/27/2021 4:09:47 PM By: Donnamarie Poag Entered By: Donnamarie Poag on 06/27/2021 14:51:19 [name], [name] ([id number]) -------------------------------------------------------------------------------- Encounter Discharge Information Details Patient Name: Elizabeth, Downs Date of Service: 06/27/2021 2:30 PM Medical Record Number: [id number] Patient Account Number: [id number] Date of Birth/Sex: May 14, 1928 (87 y.o. F) Treating RN: Donnamarie Poag Primary Care Leontina Skidmore: Viviana Simpler Other Clinician: Referring Faven Watterson: Viviana Simpler Treating Jailon Schaible/Extender: Skipper Cliche in Treatment: 74 Encounter Discharge Information Items Post Procedure Vitals Discharge Condition: Stable Temperature (F): 98.2 Ambulatory Status: Walker Pulse (bpm): 65 Discharge Destination: Home Respiratory Rate (breaths/min): 16 Transportation: Private Auto Blood Pressure (mmHg): 156/79 Accompanied By: daughter in law Schedule Follow-up Appointment: Yes Clinical Summary of Care: Electronic Signature(s) Signed: 06/27/2021 4:09:47 PM By:  Donnamarie Poag Entered By: Donnamarie Poag on 06/27/2021 15:14:29 [name], [name] ([id number]) -------------------------------------------------------------------------------- Lower Extremity Assessment Details Patient Name: [name], [name] Date of Service: 06/27/2021 2:30 PM Medical Record Number: [id number] Patient Account Number: [id number] Date of Birth/Sex: 30-Mar-1928 (87 y.o. F) Treating RN: Donnamarie Poag Primary Care Dovid Bartko: Viviana Simpler Other Clinician: Referring Viyan Rosamond: Viviana Simpler Treating Reyli Schroth/Extender: Skipper Cliche in Treatment: 35 Edema Assessment Assessed: [Left: Yes] Patrice Paradise: Yes] [Left: Edema] [Right: :] Calf Left: Right: Point of Measurement: 33 cm From Medial Instep 18.5 cm 19 cm Ankle Left: Right: Point of Measurement: 9 cm From Medial Instep 17 cm 16 cm Vascular Assessment Pulses: Dorsalis Pedis Palpable: [Left:Yes] [Right:Yes]  Electronic Signature(s) Signed: 06/27/2021 4:09:47 PM By: Donnamarie Poag Entered By: Donnamarie Poag on 06/27/2021 14:35:20 [name], [name] ([id number]) -------------------------------------------------------------------------------- Multi Wound Chart Details Patient Name: Elizabeth, Downs Date of Service: 06/27/2021 2:30 PM Medical Record Number: [id number] Patient Account Number: [id number] Date of Birth/Sex: January 31, 1928 (87 y.o. F) Treating RN: Donnamarie Poag Primary Care Shaivi Rothschild: Viviana Simpler Other Clinician: Referring Marli Diego: Viviana Simpler Treating Murat Rideout/Extender: Skipper Cliche in Treatment: 35 Vital Signs Height(in): 66 Pulse(bpm): 65 Weight(lbs): 76 Blood Pressure(mmHg): 156/79 Body Mass Index(BMI): 12 Temperature(F): 98.2 Respiratory Rate(breaths/min): 16 Photos: [N/A:N/A] Wound Location: Right, Lateral Malleolus N/A N/A Wounding Event: Gradually Appeared N/A N/A Primary Etiology: Pressure Ulcer N/A N/A Comorbid History: Arrhythmia, Coronary Artery N/A N/A Disease, Hypertension,  Peripheral Arterial Disease, History of pressure wounds, Osteoarthritis Date Acquired: 10/22/2014 N/A N/A Weeks of Treatment: 35 N/A N/A Wound Status: Open N/A N/A Measurements L x W x D (cm) 0.9x1x0.2 N/A N/A Area (cm) : 0.707 N/A N/A Volume (cm) : 0.141 N/A N/A % Reduction in Area: 9.90% N/A N/A % Reduction in Volume: -78.50% N/A N/A Classification: Category/Stage III N/A N/A Exudate Amount: Medium N/A N/A Exudate Type: Serous N/A N/A Exudate Color: amber N/A N/A Wound Margin: Flat and Intact N/A N/A Granulation Amount: Medium (34-66%) N/A N/A Granulation Quality: Red N/A N/A Necrotic Amount: Medium (34-66%) N/A N/A Exposed Structures: Fat Layer (Subcutaneous Tissue): N/A N/A Yes Fascia: No Tendon: No Muscle: No Joint: No Bone: No Epithelialization: Small (1-33%) N/A N/A Treatment Notes Electronic Signature(s) Signed: 06/27/2021 4:09:47 PM By: Donnamarie Poag Entered By: Donnamarie Poag on 06/27/2021 14:49:55 [name], [name] ([id number]) -------------------------------------------------------------------------------- [place] Details Patient Name: GAILA, ENGEBRETSEN Date of Service: 06/27/2021 2:30 PM Medical Record Number: [id number] Patient Account Number: [id number] Date of Birth/Sex: 09-Nov-1927 (87 y.o. F) Treating RN: Donnamarie Poag Primary Care Nariyah Osias: Viviana Simpler Other Clinician: Referring Marquis Diles: Viviana Simpler Treating Larsen Dungan/Extender: Skipper Cliche in Treatment: 35 Active Inactive Electronic Signature(s) Signed: 06/27/2021 4:09:47 PM By: Donnamarie Poag Entered By: Donnamarie Poag on 06/27/2021 14:35:38 [name], [name] ([id number]) -------------------------------------------------------------------------------- Pain Assessment Details Patient Name: [name], [name] Date of Service: 06/27/2021 2:30 PM Medical Record Number: [id number] Patient Account Number: [id number] Date of Birth/Sex: 1927/10/12 (87 y.o. F) Treating RN: Donnamarie Poag Primary Care Briar Sword: Viviana Simpler Other Clinician: Referring Lafaye Mcelmurry: Viviana Simpler Treating Naly Schwanz/Extender: Skipper Cliche in Treatment: 35 Active Problems Location of Pain Severity and Description of Pain Patient Has Paino No Site Locations Rate the pain. Current Pain Level: 0 Pain Management and Medication Current Pain Management: Electronic Signature(s) Signed: 06/27/2021 4:09:47 PM By: Donnamarie Poag Entered By: Donnamarie Poag on 06/27/2021 14:31:19 [place], [name] ([id number]) -------------------------------------------------------------------------------- Patient/Caregiver Education Details Patient Name: [name] Date of Service: 06/27/2021 2:30 PM Medical Record Number: [id number] Patient Account Number: [id number] Date of Birth/Gender: 1928/04/27 (87 y.o. F) Treating RN: Donnamarie Poag Primary Care Physician: Viviana Simpler Other Clinician: Referring Physician: Viviana Simpler Treating Physician/Extender: Skipper Cliche in Treatment: 74 Education Assessment Education Provided To: Patient and Caregiver Education Topics Provided Wound/Skin Impairment: Engineer, maintenance) Signed: 06/27/2021 4:09:47 PM By: Donnamarie Poag Entered By: Donnamarie Poag on 06/27/2021 14:56:21 [name], [name] ([id number]) -------------------------------------------------------------------------------- Wound Assessment Details Patient Name: [name], [name] Date of Service: 06/27/2021 2:30 PM Medical Record Number: [id number] Patient Account Number: [id number] Date of Birth/Sex: [birth year] (87 y.o. F) Treating RN: Donnamarie Poag Primary Care Elianie Hubers: Viviana Simpler Other Clinician: Referring Deandrea Rion: Viviana Simpler Treating Lunabelle Oatley/Extender: Skipper Cliche in Treatment: 35 Wound Status Wound Number: 1 Primary  Pressure Ulcer Etiology: Wound Location: Right, Lateral Malleolus Wound Open Wounding Event: Gradually Appeared Status: Date Acquired: 10/22/2014 Comorbid  Arrhythmia, Coronary Artery Disease, Hypertension, Weeks Of Treatment: 35 History: Peripheral Arterial Disease, History of pressure wounds, Clustered Wound: No Osteoarthritis Photos Wound Measurements Length: (cm) 0.9 Width: (cm) 1 Depth: (cm) 0.2 Area: (cm) 0.707 Volume: (cm) 0.141 % Reduction in Area: 9.9% % Reduction in Volume: -78.5% Epithelialization: Small (1-33%) Tunneling: No Undermining: No Wound Description Classification: Category/Stage III Wound Margin: Flat and Intact Exudate Amount: Medium Exudate Type: Serous Exudate Color: amber Foul Odor After Cleansing: No Slough/Fibrino Yes Wound Bed Granulation Amount: Medium (34-66%) Exposed Structure Granulation Quality: Red Fascia Exposed: No Necrotic Amount: Medium (34-66%) Fat Layer (Subcutaneous Tissue) Exposed: Yes Necrotic Quality: Adherent Slough Tendon Exposed: No Muscle Exposed: No Joint Exposed: No Bone Exposed: No Treatment Notes Wound #1 (Malleolus) Wound Laterality: Right, Lateral Cleanser Soap and Water Discharge Instruction: Gently cleanse wound with antibacterial soap, rinse and pat dry prior to dressing wounds DENNISE, RAABE ([id number]) Peri-Wound Care Topical Primary Dressing Prisma 4.34 (in) Discharge Instruction: Moisten w/normal saline or sterile water; Cover wound as directed. Do not remove from wound bed. Secondary Dressing ABD Pad 5x9 (in/in) Discharge Instruction: Cover with ABD pad Secured With Compression Wrap Profore Lite LF 3 Multilayer Compression Bandaging System Discharge Instruction: Apply 3 multi-layer wrap as prescribed. Compression Stockings Add-Ons Electronic Signature(s) Signed: 06/27/2021 4:09:47 PM By: Donnamarie Poag Entered By: Donnamarie Poag on 06/27/2021 14:34:12 Joost, [name] ([id number]) -------------------------------------------------------------------------------- [place] Details Patient Name: [name] Date of Service: 06/27/2021 2:30  PM Medical Record Number: [id number] Patient Account Number: [id number] Date of Birth/Sex: 02/20/1928 (87 y.o. F) Treating RN: Donnamarie Poag Primary Care Martine Trageser: Viviana Simpler Other Clinician: Referring Jerrod Damiano: Viviana Simpler Treating Makensey Rego/Extender: Skipper Cliche in Treatment: 35 Vital Signs Time Taken: 14:22 Temperature (F): 98.2 Height (in): 66 Pulse (bpm): 65 Weight (lbs): 76 Respiratory Rate (breaths/min): 16 Body Mass Index (BMI): 12.3 Blood Pressure (mmHg): 156/79 Reference Range: 80 - 120 mg / dl Electronic Signature(s) Signed: 06/27/2021 4:09:47 PM By: Donnamarie Poag Entered ByDonnamarie Poag on 06/27/2021 14:24:33

## 2021-06-27 NOTE — Progress Notes (Addendum)
CANDISS, GALEANA ([id number]) Visit Report for 06/27/2021 Chief Complaint Document Details Patient Name: [name], [name]. Date of Service: 06/27/2021 2:30 PM Medical Record Number: [id number] Patient Account Number: [id number] Date of Birth/Sex: 1928-01-21 (87 y.o. F) Treating RN: Donnamarie Poag Primary Care Provider: Viviana Simpler Other Clinician: Referring Provider: Viviana Simpler Treating Provider/Extender: Skipper Cliche in Treatment: 35 Information Obtained from: Patient Chief Complaint Pressure ulcer right ankle and left heel Electronic Signature(s) Signed: 06/27/2021 2:24:35 PM By: Worthy Keeler PA-C Entered By: Worthy Keeler on 06/27/2021 14:24:34 Bonaparte, [name] ([id number]) -------------------------------------------------------------------------------- Debridement Details Patient Name: [name] Date of Service: 06/27/2021 2:30 PM Medical Record Number: [id number] Patient Account Number: [id number] Date of Birth/Sex: [birth year] (87 y.o. F) Treating RN: Donnamarie Poag Primary Care Provider: Viviana Simpler Other Clinician: Referring Provider: Viviana Simpler Treating Provider/Extender: Skipper Cliche in Treatment: 35 Debridement Performed for Wound #1 Right,Lateral Malleolus Assessment: Performed By: Physician Tommie Sams., PA-C Debridement Type: Debridement Level of Consciousness (Pre- Awake and Alert procedure): Pre-procedure Verification/Time Out Yes - 14:50 Taken: Start Time: 14:51 Pain Control: Lidocaine Total Area Debrided (L x W): 0.9 (cm) x 1 (cm) = 0.9 (cm) Tissue and other material Viable, Non-Viable, Slough, Subcutaneous, Slough debrided: Level: Skin/Subcutaneous Tissue Debridement Description: Excisional Instrument: Curette Bleeding: Minimum Hemostasis Achieved: Pressure Response to Treatment: Procedure was tolerated well Level of Consciousness (Post- Awake and Alert procedure): Post Debridement Measurements of Total  Wound Length: (cm) 0.9 Stage: Category/Stage III Width: (cm) 1 Depth: (cm) 0.2 Volume: (cm) 0.141 Character of Wound/Ulcer Post Debridement: Improved Post Procedure Diagnosis Same as Pre-procedure Electronic Signature(s) Signed: 06/27/2021 4:09:47 PM By: Donnamarie Poag Signed: 06/27/2021 4:40:54 PM By: Worthy Keeler PA-C Entered By: Donnamarie Poag on 06/27/2021 14:52:54 Carvey, [name] ([id number]) -------------------------------------------------------------------------------- HPI Details Patient Name: [name] Date of Service: 06/27/2021 2:30 PM Medical Record Number: [id number] Patient Account Number: [id number] Date of Birth/Sex: 23-Jan-1928 (87 y.o. F) Treating RN: Donnamarie Poag Primary Care Provider: Viviana Simpler Other Clinician: Referring Provider: Viviana Simpler Treating Provider/Extender: Skipper Cliche in Treatment: 35 History of Present Illness HPI Description: 10/21/2020 upon evaluation today patient presents for initial inspection here in our clinic concerning issues that she has been having quite some time in regard to her ankle and since she has been in the hospital with regard to the heel. The ankle in fact has been 5-6 years at least I am told. With that being said the heel ulcer occurred when she was in the hospital in December for hip surgery when she fractured her hip. She also was in the hospital Tuesday for altered mental status. Really there was nothing that was identified as the cause for this. She did have a. Fortunately there does not appear to be any signs of infection she tells me that she has had she believes arterial studies At [facility] clinic I could not find Those studies at this point. Nonetheless I will continue to look and see what I can find before Then. The patient also has a skin tear on her arm this occurred more recently when she bumped this at home. The patient does have a history of hypertension, coronary artery disease, and peripheral  vascular disease stated. 10/28/2020 I was able to find the patient's chart currently which shows that she did have an arterial study performed in May 2019. This showed that she had a abnormal right toe brachial index and a normal left toe brachial index. She was noncompressible as far as ABIs were  concerned. She did appear to have triphasic flow at that time. Unfortunately the wound that is commented on in the report that I printed off and read mentions the same wound on the ankle that we are still dealing with at this point. Unfortunately this has not healed. And its been quite sometime about 3 years now. Fortunately there does not appear to be any signs of active infection systemically at this point. I think that the patient has done well with the Santyl over the past week which is good news. Patient's caregiver which is her daughter-in-law is concerned about the fact that she really does not feel qualified to be able to change the dressings and take care of this issue. There does not appear to be any signs of anything untoward going on at this point. I think she is done a great job applying the [employer] and I think that has done a great job for the patient is for soften up some of the necrotic tissue. With that being said I think the Xeroform on the arm also has done excellent. In general I am very pleased with where we stand. And I told the patient's daughter-in- law as well that I also feel like she has done a great job over the past week taking care of her mother. 11/11/2020 upon evaluation today patient appears to be doing well with regard to her wounds. She has been tolerating the dressing changes without complication her daughters been applying the Santyl which has done a great job. Ho with that being said I think now that we have good arterial study showing we can go ahead and proceed with sharp debridement at this point. 11/25/2020 upon evaluation today patient appears to be doing well with  regard to her wounds. The Santyl has really helped to clean things up and overall she is doing quite excellent at this point. Fortunately there does not appear to be any signs of infection which is great news and in general extremely pleased. I do believe some debridement is in order and hopefully will be able to get her into a collagen dressing after this. 12/23/2020 upon evaluation today patient appears to be doing well with regard to her wound all things considered. Unfortunately it does sound like her daughter decided to let this air out because she felt like it was getting so wet. It sounds like she got border gauze dressings instead of border foam therefore there is really Nothing to catch the excess drainage which I think is the problem they were worried about the smell. Fortunately there does not appear to be any signs of active infection which is great news. Nonetheless I do not think we want to leave this just open to air. 01/13/2021 upon evaluation today patient's wounds actually appear to be about as good as have seen since have been taking care of her. Fortunately there does not appear to be any evidence of infection which is great and overall the biggest issue I see is that of fluid buildup. I think we need to do something to try to help manage this. I think if we can control her edema we get the wounds to heal more effectively. 01/27/2021 upon evaluation today patient appears to be doing well in regard to the wounds on her right lateral malleolus as well as the left heel and the right ischial tuberosity is unfortunately a new area that has arisen since we last saw her. She tells me currently that that is from sitting  too much. With that being said this actually appears to be doing the worst of anything so far that I see today. Fortunately there does not appear to be any signs of infection this is at least good news. Compression wrap seem to be doing excellent for her as far as the legs are  concerned. 02/03/2021 upon evaluation today patient appears to be doing well with regard to all of her wounds. She has been tolerating the dressing changes without complication. Fortunately there is no signs of active infection at this time. No fevers, chills, nausea, vomiting, or diarrhea. 02/10/2021 upon evaluation today patient appears to be doing well with regard to her wounds. With that being said there is some slight evidence of hypergranulation in regard to the right ankle in particular and a little bit in regard to the left heel. I think Hydrofera Blue might be a better option to go to at this point based on what I am seeing especially since both of these wounds in particular seem to be a little bit stalled. I discussed that with the patient and her daughter today. With regard to the hip this is doing great with the collagen I think will get very close to complete closure I recommend we continue with the collagen. 02/17/2021 upon evaluation today patient appears to be doing excellent in regard to her heel ulcers. She has been tolerating the dressing changes without complication and overall I am extremely pleased with where things stand today. There does not appear to be any signs of active infection which is great news. Overall I may think that we are headed in the proper direction based on what I am seeing. 02/24/2021 upon evaluation today patient's wounds actually are showing signs of improvement in regard to her heels both are doing awesome. In regard to her hip location this is actually reopened after being closed last week and to be perfectly honest I am more concerned here about the fact that pressures causing this issue I do not think it has anything to do with her not having the collagen in place again it was completely healed last week there was no reason for the collagen to be there. 03/03/2021 upon evaluation today patient appears to be doing well with regard to her wounds. Everything is  showing signs of improvement and SUE, FERNICOLA. ([id number]) doing some much better as far as the overall size of the wound. Fortunately I think we are headed in the right direction. 03/10/2021 upon evaluation today patient appears to be doing well with regard to her wound. She is tolerating the dressing changes without complication. The left heel pretty much appears to be almost completely healed although I think it is probably can be 1 more week before I can call this 100% slough. The right ankle is significantly improved with that being said its not completely closed. With regard to the right hip this is completely closed. Overall I am very pleased with where things stand I think were getting very close to complete resolution. I discussed all this with the patient and her daughter-in-law today. 03/17/2021 upon evaluation today patient appears to be doing well with regard to her wounds. Fortunately there does not appear to be any signs of active infection which is great news and overall very pleased with where the patient stands today. I am in general thankful that overall she is making great progress here. There is good to be a little bit of debridement here to clear away some of  the necrotic debris today on both wound locations. 03/24/2021 upon evaluation today patient appears to be doing excellent in regard to her wound. She has been tolerating the dressing changes without complication. Fortunately there does not appear to be any evidence of infection the left foot is completely healed this is great news. On the right at the ankle region this is still open though I do not think the alginate did quite as well as I was hoping as far as getting this dried out. I think that we may just want to switch back to the [facility] which has done well up to this point. I was just hopeful this would wrap things up completely for [place]. 03/31/2021 upon evaluation today patient appears to be doing well with regard  to her wound. This is on the right side which appears to be doing better than last week I think is definitely measuring smaller and this is great news. Overall I am very pleased with where we stand today. I do believe the [facility] is doing better for her. 04/14/2021 upon evaluation today patient appears to be doing well with regard to her wound. I am very pleased with the way it stands I think that she is headed in the right direction. Unfortunately she is having issues with the Tubigrip neither she nor her daughter-in-law who is the primary caregiver can get this on. Subsequently that means that they have been having a very difficult time with complying with the necessary things for this left leg. With that being said I am really not sure what to do to help in that regard. We can always try a bigger that is larger size Tubigrip but at the same time that means that she may not get as good compression and it may not keep things under control the only way to know is to try. 04/21/2021 upon evaluation today patient appears to be doing well in regard to her wound. She has been tolerating the dressing changes without complication and in fact the right ankle ulcer is actually showing signs of excellent improvement I am very pleased with where things stand today. No fevers, chills, nausea, vomiting, or diarrhea. 04/28/2021 upon evaluation today patient appears to be doing well with regard to her ankle ulcer. This is actually showing signs of good improvement which is great news and overall very pleased with where we stand today. No fevers, chills, nausea, vomiting, or diarrhea. 05/05/2021 upon evaluation today patient's wound is actually showing signs of significant improvement and overall I am extremely pleased with where we stand today. There does not appear to be any signs of active infection which is great news and overall I am extremely pleased with where we stand at this point. No fevers, chills,  nausea, vomiting, or diarrhea. 05/12/2021 upon evaluation today patient appears to be doing okay in regard to her wound is measuring a little bit smaller today that is good news. With that being said she still continues to have significant issues here with an open wound on the lateral aspect of her ankle. Fortunately there does not appear to be any signs of active infection systemically which is great news. No fevers, chills, nausea, vomiting, or diarrhea. 06/13/2021 upon evaluation today patient unfortunately continues to have issues with her right leg. Specifically the ankle region where there is an open wound. Previously the wound on the left leg had completely closed. Nonetheless this left heel has reopened as well. I am going to perform some debridement  in regard to the heel ulcer appears to be a fluid-filled blister underneath this area. She has not been here for such a long time due to the fact that she was very sick and they were not able to bring her out. That is the reason we have not seen her in over the past month. 06/20/2021 upon evaluation patient's wounds currently are doing well on the left heel this appears to be healed. There does not appear to be any signs of drainage at this time which is great news. No fevers, chills, nausea, vomiting, or diarrhea. On the right heel there is still an open wound but I feel like we are showing some signs of improvement this is still good to take a bit of time. 06/27/2021 upon evaluation today patient appears to be doing better in regard to her wound. This is actually showing signs of good improvement which is great news. Fortunately I do not see any signs of infection currently which is great and overall we are finally seeing some actual decrease in the size I think that the compression wraps are making a big difference here. Electronic Signature(s) Signed: 06/27/2021 3:02:19 PM By: Worthy Keeler PA-C Entered By: Worthy Keeler on 06/27/2021  15:02:18 Keil, [name] ([id number]) -------------------------------------------------------------------------------- Physical Exam Details Patient Name: RAILEIGH, SABATER Date of Service: 06/27/2021 2:30 PM Medical Record Number: [id number] Patient Account Number: [id number] Date of Birth/Sex: 12/09/1927 (87 y.o. F) Treating RN: Donnamarie Poag Primary Care Provider: Viviana Simpler Other Clinician: Referring Provider: Viviana Simpler Treating Provider/Extender: Skipper Cliche in Treatment: 5 Constitutional Well-nourished and well-hydrated in no acute distress. Respiratory normal breathing without difficulty. Psychiatric this patient is able to make decisions and demonstrates good insight into disease process. Alert and Oriented x 3. pleasant and cooperative. Notes Patient's wounds again showed signs of doing better her right heel is actually significantly smaller compared to last week which is great news and overall I think we are on the right track. I think the collagen is doing a good job here as well. Electronic Signature(s) Signed: 06/27/2021 3:02:37 PM By: Worthy Keeler PA-C Entered By: Worthy Keeler on 06/27/2021 15:02:37 Morais, [name] ([id number]) -------------------------------------------------------------------------------- Physician Orders Details Patient Name: ALLONA, GONDEK Date of Service: 06/27/2021 2:30 PM Medical Record Number: [id number] Patient Account Number: [id number] Date of Birth/Sex: 11/23/1927 (87 y.o. F) Treating RN: Donnamarie Poag Primary Care Provider: Viviana Simpler Other Clinician: Referring Provider: Viviana Simpler Treating Provider/Extender: Skipper Cliche in Treatment: 38 Verbal / Phone Orders: No Diagnosis Coding ICD-10 Coding Code Description L89.513 Pressure ulcer of right ankle, stage 3 I73.89 Other specified peripheral vascular diseases L89.623 Pressure ulcer of left heel, stage 3 I10 Essential (primary) hypertension I25.10  Atherosclerotic heart disease of native coronary artery without angina pectoris Follow-up Appointments o Return Appointment in 1 week. o Nurse Visit as needed Bathing/ Shower/ Hygiene o May shower with wound dressing protected with water repellent cover or cast protector. o No tub bath. Anesthetic (Use 'Patient Medications' Section for Anesthetic Order Entry) o Lidocaine applied to wound bed Edema Control - Lymphedema / Segmental Compressive Device / Other Bilateral Lower Extremities o Optional: One layer of unna paste to top of compression wrap (to act as an anchor). o 3 Layer Compression System for Lymphedema. - left side abd pad over calc o Elevate legs to the level of the heart and pump ankles as often as possible o Elevate leg(s) parallel to the floor when sitting. o DO YOUR BEST  to sleep in the bed at night. DO NOT sleep in your recliner. Long hours of sitting in a recliner leads to swelling of the legs and/or potential wounds on your backside. Non-Wound Condition o Additional non-wound orders/instructions: - ABD bilateral to pad shins anytime compression wrap is used Additional Orders / Instructions o Follow Nutritious Diet and Increase Protein Intake Wound Treatment Wound #1 - Malleolus Wound Laterality: Right, Lateral Cleanser: Soap and Water 1 x Per Week/15 Days Discharge Instructions: Gently cleanse wound with antibacterial soap, rinse and pat dry prior to dressing wounds Primary Dressing: Prisma 4.34 (in) 1 x Per Week/15 Days Discharge Instructions: Moisten w/normal saline or sterile water; Cover wound as directed. Do not remove from wound bed. Secondary Dressing: ABD Pad 5x9 (in/in) 1 x Per Week/15 Days Discharge Instructions: Cover with ABD pad Compression Wrap: Profore Lite LF 3 Multilayer Compression Bandaging System 1 x Per Week/15 Days Discharge Instructions: Apply 3 multi-layer wrap as prescribed. Electronic Signature(s) Signed: 06/27/2021  4:09:47 PM By: Donnamarie Poag Signed: 06/27/2021 4:40:54 PM By: Eyvonne Mechanic, [place] GMarland Kitchen ([id number]) Entered By: Donnamarie Poag on 06/27/2021 14:55:38 Boster, [name] ([id number]) -------------------------------------------------------------------------------- Problem List Details Patient Name: ADDELYN, ALLEMAN Date of Service: 06/27/2021 2:30 PM Medical Record Number: [id number] Patient Account Number: [id number] Date of Birth/Sex: November 01, 1927 (87 y.o. F) Treating RN: Donnamarie Poag Primary Care Provider: Viviana Simpler Other Clinician: Referring Provider: Viviana Simpler Treating Provider/Extender: Skipper Cliche in Treatment: 21 Active Problems ICD-10 Encounter Code Description Active Date MDM Diagnosis L89.513 Pressure ulcer of right ankle, stage 3 10/21/2020 No Yes I73.89 Other specified peripheral vascular diseases 10/21/2020 No Yes L89.623 Pressure ulcer of left heel, stage 3 10/21/2020 No Yes I10 Essential (primary) hypertension 10/21/2020 No Yes I25.10 Atherosclerotic heart disease of native coronary artery without angina 10/21/2020 No Yes pectoris Inactive Problems Resolved Problems ICD-10 Code Description Active Date Resolved Date S51.802A Unspecified open wound of left forearm, initial encounter 10/21/2020 10/21/2020 L89.312 Pressure ulcer of right buttock, stage 2 01/27/2021 01/27/2021 Electronic Signature(s) Signed: 06/27/2021 2:24:26 PM By: Worthy Keeler PA-C Entered By: Worthy Keeler on 06/27/2021 14:24:26 Steeber, [name] ([id number]) -------------------------------------------------------------------------------- Progress Note Details Patient Name: [name] Date of Service: 06/27/2021 2:30 PM Medical Record Number: [id number] Patient Account Number: [id number] Date of Birth/Sex: [birth year] (87 y.o. F) Treating RN: Donnamarie Poag Primary Care Provider: Viviana Simpler Other Clinician: Referring Provider: Viviana Simpler Treating Provider/Extender:  Skipper Cliche in Treatment: 35 Subjective Chief Complaint Information obtained from Patient Pressure ulcer right ankle and left heel History of Present Illness (HPI) 10/21/2020 upon evaluation today patient presents for initial inspection here in our clinic concerning issues that she has been having quite some time in regard to her ankle and since she has been in the hospital with regard to the heel. The ankle in fact has been 5-6 years at least I am told. With that being said the heel ulcer occurred when she was in the hospital in December for hip surgery when she fractured her hip. She also was in the hospital Tuesday for altered mental status. Really there was nothing that was identified as the cause for this. She did have a. Fortunately there does not appear to be any signs of infection she tells me that she has had she believes arterial studies At [facility] clinic I could not find Those studies at this point. Nonetheless I will continue to look and see what I can find before Then. The patient also has a skin  tear on her arm this occurred more recently when she bumped this at home. The patient does have a history of hypertension, coronary artery disease, and peripheral vascular disease stated. 10/28/2020 I was able to find the patient's chart currently which shows that she did have an arterial study performed in May 2019. This showed that she had a abnormal right toe brachial index and a normal left toe brachial index. She was noncompressible as far as ABIs were concerned. She did appear to have triphasic flow at that time. Unfortunately the wound that is commented on in the report that I printed off and read mentions the same wound on the ankle that we are still dealing with at this point. Unfortunately this has not healed. And its been quite sometime about 3 years now. Fortunately there does not appear to be any signs of active infection systemically at this point. I think that the patient  has done well with the Santyl over the past week which is good news. Patient's caregiver which is her daughter-in-law is concerned about the fact that she really does not feel qualified to be able to change the dressings and take care of this issue. There does not appear to be any signs of anything untoward going on at this point. I think she is done a great job applying the [employer] and I think that has done a great job for the patient is for soften up some of the necrotic tissue. With that being said I think the Xeroform on the arm also has done excellent. In general I am very pleased with where we stand. And I told the patient's daughter-in- law as well that I also feel like she has done a great job over the past week taking care of her mother. 11/11/2020 upon evaluation today patient appears to be doing well with regard to her wounds. She has been tolerating the dressing changes without complication her daughters been applying the Santyl which has done a great job. Ho with that being said I think now that we have good arterial study showing we can go ahead and proceed with sharp debridement at this point. 11/25/2020 upon evaluation today patient appears to be doing well with regard to her wounds. The Santyl has really helped to clean things up and overall she is doing quite excellent at this point. Fortunately there does not appear to be any signs of infection which is great news and in general extremely pleased. I do believe some debridement is in order and hopefully will be able to get her into a collagen dressing after this. 12/23/2020 upon evaluation today patient appears to be doing well with regard to her wound all things considered. Unfortunately it does sound like her daughter decided to let this air out because she felt like it was getting so wet. It sounds like she got border gauze dressings instead of border foam therefore there is really Nothing to catch the excess drainage which I think is  the problem they were worried about the smell. Fortunately there does not appear to be any signs of active infection which is great news. Nonetheless I do not think we want to leave this just open to air. 01/13/2021 upon evaluation today patient's wounds actually appear to be about as good as have seen since have been taking care of her. Fortunately there does not appear to be any evidence of infection which is great and overall the biggest issue I see is that of fluid buildup. I think  we need to do something to try to help manage this. I think if we can control her edema we get the wounds to heal more effectively. 01/27/2021 upon evaluation today patient appears to be doing well in regard to the wounds on her right lateral malleolus as well as the left heel and the right ischial tuberosity is unfortunately a new area that has arisen since we last saw her. She tells me currently that that is from sitting too much. With that being said this actually appears to be doing the worst of anything so far that I see today. Fortunately there does not appear to be any signs of infection this is at least good news. Compression wrap seem to be doing excellent for her as far as the legs are concerned. 02/03/2021 upon evaluation today patient appears to be doing well with regard to all of her wounds. She has been tolerating the dressing changes without complication. Fortunately there is no signs of active infection at this time. No fevers, chills, nausea, vomiting, or diarrhea. 02/10/2021 upon evaluation today patient appears to be doing well with regard to her wounds. With that being said there is some slight evidence of hypergranulation in regard to the right ankle in particular and a little bit in regard to the left heel. I think Hydrofera Blue might be a better option to go to at this point based on what I am seeing especially since both of these wounds in particular seem to be a little bit stalled. I discussed  that with the patient and her daughter today. With regard to the hip this is doing great with the collagen I think will get very close to complete closure I recommend we continue with the collagen. 02/17/2021 upon evaluation today patient appears to be doing excellent in regard to her heel ulcers. She has been tolerating the dressing changes without complication and overall I am extremely pleased with where things stand today. There does not appear to be any signs of active infection which is great news. Overall I may think that we are headed in the proper direction based on what I am seeing. 02/24/2021 upon evaluation today patient's wounds actually are showing signs of improvement in regard to her heels both are doing awesome. In [place], [place] ([id number]) regard to her hip location this is actually reopened after being closed last week and to be perfectly honest I am more concerned here about the fact that pressures causing this issue I do not think it has anything to do with her not having the collagen in place again it was completely healed last week there was no reason for the collagen to be there. 03/03/2021 upon evaluation today patient appears to be doing well with regard to her wounds. Everything is showing signs of improvement and doing some much better as far as the overall size of the wound. Fortunately I think we are headed in the right direction. 03/10/2021 upon evaluation today patient appears to be doing well with regard to her wound. She is tolerating the dressing changes without complication. The left heel pretty much appears to be almost completely healed although I think it is probably can be 1 more week before I can call this 100% slough. The right ankle is significantly improved with that being said its not completely closed. With regard to the right hip this is completely closed. Overall I am very pleased with where things stand I think were getting very close to complete  resolution. I  discussed all this with the patient and her daughter-in-law today. 03/17/2021 upon evaluation today patient appears to be doing well with regard to her wounds. Fortunately there does not appear to be any signs of active infection which is great news and overall very pleased with where the patient stands today. I am in general thankful that overall she is making great progress here. There is good to be a little bit of debridement here to clear away some of the necrotic debris today on both wound locations. 03/24/2021 upon evaluation today patient appears to be doing excellent in regard to her wound. She has been tolerating the dressing changes without complication. Fortunately there does not appear to be any evidence of infection the left foot is completely healed this is great news. On the right at the ankle region this is still open though I do not think the alginate did quite as well as I was hoping as far as getting this dried out. I think that we may just want to switch back to the [facility] which has done well up to this point. I was just hopeful this would wrap things up completely for [place]. 03/31/2021 upon evaluation today patient appears to be doing well with regard to her wound. This is on the right side which appears to be doing better than last week I think is definitely measuring smaller and this is great news. Overall I am very pleased with where we stand today. I do believe the [facility] is doing better for her. 04/14/2021 upon evaluation today patient appears to be doing well with regard to her wound. I am very pleased with the way it stands I think that she is headed in the right direction. Unfortunately she is having issues with the Tubigrip neither she nor her daughter-in-law who is the primary caregiver can get this on. Subsequently that means that they have been having a very difficult time with complying with the necessary things for this left leg. With that  being said I am really not sure what to do to help in that regard. We can always try a bigger that is larger size Tubigrip but at the same time that means that she may not get as good compression and it may not keep things under control the only way to know is to try. 04/21/2021 upon evaluation today patient appears to be doing well in regard to her wound. She has been tolerating the dressing changes without complication and in fact the right ankle ulcer is actually showing signs of excellent improvement I am very pleased with where things stand today. No fevers, chills, nausea, vomiting, or diarrhea. 04/28/2021 upon evaluation today patient appears to be doing well with regard to her ankle ulcer. This is actually showing signs of good improvement which is great news and overall very pleased with where we stand today. No fevers, chills, nausea, vomiting, or diarrhea. 05/05/2021 upon evaluation today patient's wound is actually showing signs of significant improvement and overall I am extremely pleased with where we stand today. There does not appear to be any signs of active infection which is great news and overall I am extremely pleased with where we stand at this point. No fevers, chills, nausea, vomiting, or diarrhea. 05/12/2021 upon evaluation today patient appears to be doing okay in regard to her wound is measuring a little bit smaller today that is good news. With that being said she still continues to have significant issues here with an open wound on  the lateral aspect of her ankle. Fortunately there does not appear to be any signs of active infection systemically which is great news. No fevers, chills, nausea, vomiting, or diarrhea. 06/13/2021 upon evaluation today patient unfortunately continues to have issues with her right leg. Specifically the ankle region where there is an open wound. Previously the wound on the left leg had completely closed. Nonetheless this left heel has reopened as  well. I am going to perform some debridement in regard to the heel ulcer appears to be a fluid-filled blister underneath this area. She has not been here for such a long time due to the fact that she was very sick and they were not able to bring her out. That is the reason we have not seen her in over the past month. 06/20/2021 upon evaluation patient's wounds currently are doing well on the left heel this appears to be healed. There does not appear to be any signs of drainage at this time which is great news. No fevers, chills, nausea, vomiting, or diarrhea. On the right heel there is still an open wound but I feel like we are showing some signs of improvement this is still good to take a bit of time. 06/27/2021 upon evaluation today patient appears to be doing better in regard to her wound. This is actually showing signs of good improvement which is great news. Fortunately I do not see any signs of infection currently which is great and overall we are finally seeing some actual decrease in the size I think that the compression wraps are making a big difference here. Objective Constitutional Well-nourished and well-hydrated in no acute distress. Vitals Time Taken: 2:22 PM, Height: 66 in, Weight: 76 lbs, BMI: 12.3, Temperature: 98.2 F, Pulse: 65 bpm, Respiratory Rate: 16 breaths/min, Blood Pressure: 156/79 mmHg. SHARLISA, HOLLIFIELD ([id number]) Respiratory normal breathing without difficulty. Psychiatric this patient is able to make decisions and demonstrates good insight into disease process. Alert and Oriented x 3. pleasant and cooperative. General Notes: Patient's wounds again showed signs of doing better her right heel is actually significantly smaller compared to last week which is great news and overall I think we are on the right track. I think the collagen is doing a good job here as well. Integumentary (Hair, Skin) Wound #1 status is Open. Original cause of wound was Gradually Appeared.  The date acquired was: 10/22/2014. The wound has been in treatment 35 weeks. The wound is located on the Right,Lateral Malleolus. The wound measures 0.9cm length x 1cm width x 0.2cm depth; 0.707cm^2 area and 0.141cm^3 volume. There is Fat Layer (Subcutaneous Tissue) exposed. There is no tunneling or undermining noted. There is a medium amount of serous drainage noted. The wound margin is flat and intact. There is medium (34-66%) red granulation within the wound bed. There is a medium (34-66%) amount of necrotic tissue within the wound bed including Adherent Slough. Assessment Active Problems ICD-10 Pressure ulcer of right ankle, stage 3 Other specified peripheral vascular diseases Pressure ulcer of left heel, stage 3 Essential (primary) hypertension Atherosclerotic heart disease of native coronary artery without angina pectoris Procedures Wound #1 Pre-procedure diagnosis of Wound #1 is a Pressure Ulcer located on the Right,Lateral Malleolus . There was a Excisional Skin/Subcutaneous Tissue Debridement with a total area of 0.9 sq cm performed by Tommie Sams., PA-C. With the following instrument(s): Curette to remove Viable and Non-Viable tissue/material. Material removed includes Subcutaneous Tissue and Slough and after achieving pain control using Lidocaine. A time  out was conducted at 14:50, prior to the start of the procedure. A Minimum amount of bleeding was controlled with Pressure. The procedure was tolerated well. Post Debridement Measurements: 0.9cm length x 1cm width x 0.2cm depth; 0.141cm^3 volume. Post debridement Stage noted as Category/Stage III. Character of Wound/Ulcer Post Debridement is improved. Post procedure Diagnosis Wound #1: Same as Pre-Procedure Pre-procedure diagnosis of Wound #1 is a Pressure Ulcer located on the Right,Lateral Malleolus . There was a Three Layer Compression Therapy Procedure by Donnamarie Poag, RN. Post procedure Diagnosis Wound #1: Same as  Pre-Procedure There was a Three Layer Compression Therapy Procedure by Donnamarie Poag, RN. Post procedure Diagnosis Wound #: Same as Pre-Procedure Plan Follow-up Appointments: Return Appointment in 1 week. Nurse Visit as needed Bathing/ Shower/ Hygiene: May shower with wound dressing protected with water repellent cover or cast protector. No tub bath. Anesthetic (Use 'Patient Medications' Section for Anesthetic Order Entry): Lidocaine applied to wound bed Edema Control - Lymphedema / Segmental Compressive Device / Other: Optional: One layer of unna paste to top of compression wrap (to act as an anchor). JAYLIANI, WANNER ([id number]) 3 Layer Compression System for Lymphedema. - left side abd pad over calc Elevate legs to the level of the heart and pump ankles as often as possible Elevate leg(s) parallel to the floor when sitting. DO YOUR BEST to sleep in the bed at night. DO NOT sleep in your recliner. Long hours of sitting in a recliner leads to swelling of the legs and/or potential wounds on your backside. Non-Wound Condition: Additional non-wound orders/instructions: - ABD bilateral to pad shins anytime compression wrap is used Additional Orders / Instructions: Follow Nutritious Diet and Increase Protein Intake WOUND #1: - Malleolus Wound Laterality: Right, Lateral Cleanser: Soap and Water 1 x Per Week/15 Days Discharge Instructions: Gently cleanse wound with antibacterial soap, rinse and pat dry prior to dressing wounds Primary Dressing: Prisma 4.34 (in) 1 x Per Week/15 Days Discharge Instructions: Moisten w/normal saline or sterile water; Cover wound as directed. Do not remove from wound bed. Secondary Dressing: ABD Pad 5x9 (in/in) 1 x Per Week/15 Days Discharge Instructions: Cover with ABD pad Compression Wrap: Profore Lite LF 3 Multilayer Compression Bandaging System 1 x Per Week/15 Days Discharge Instructions: Apply 3 multi-layer wrap as prescribed. 1. I would recommend that we  go ahead and continue with the wound care measures as before and the patient is in agreement with the plan. This includes the use of the silver collagen dressing followed by an ABD pad and then a 3 layer compression wrap. 2. I would recommend as well she continue to elevate her legs is much as possible to help with edema control. We will see patient back for reevaluation in 1 week here in the clinic. If anything worsens or changes patient will contact our office for additional recommendations. Electronic Signature(s) Signed: 06/27/2021 3:03:19 PM By: Worthy Keeler PA-C Entered By: Worthy Keeler on 06/27/2021 15:03:19 Rymer, [name] ([id number]) -------------------------------------------------------------------------------- SuperBill Details Patient Name: CANDELARIA, PIES Date of Service: 06/27/2021 Medical Record Number: [id number] Patient Account Number: [id number] Date of Birth/Sex: 01/14/1928 (87 y.o. F) Treating RN: Donnamarie Poag Primary Care Provider: Viviana Simpler Other Clinician: Referring Provider: Viviana Simpler Treating Provider/Extender: Skipper Cliche in Treatment: 35 Diagnosis Coding ICD-10 Codes Code Description [phone] Pressure ulcer of right ankle, stage 3 I73.89 Other specified peripheral vascular diseases L89.623 Pressure ulcer of left heel, stage 3 I10 Essential (primary) hypertension I25.10 Atherosclerotic heart disease of native coronary artery  without angina pectoris Facility Procedures CPT4 Code: [id number] Description: 46503 - DEB SUBQ TISSUE 20 SQ CM/< Modifier: Quantity: 1 CPT4 Code: Description: ICD-10 Diagnosis Description L89.513 Pressure ulcer of right ankle, stage 3 Modifier: Quantity: Physician Procedures CPT4 Code: [id number] Description: 27517 - WC PHYS SUBQ TISS 20 SQ CM Modifier: Quantity: 1 CPT4 Code: Description: ICD-10 Diagnosis Description L89.513 Pressure ulcer of right ankle, stage 3 Modifier: Quantity: Electronic  Signature(s) Signed: 06/27/2021 3:03:36 PM By: Worthy Keeler PA-C Entered By: Worthy Keeler on 06/27/2021 15:03:35

## 2021-06-30 ENCOUNTER — Other Ambulatory Visit: Payer: Self-pay | Admitting: Internal Medicine

## 2021-07-04 ENCOUNTER — Encounter: Payer: PPO | Admitting: Internal Medicine

## 2021-07-04 ENCOUNTER — Other Ambulatory Visit: Payer: Self-pay

## 2021-07-04 ENCOUNTER — Ambulatory Visit: Payer: PPO | Admitting: Internal Medicine

## 2021-07-04 DIAGNOSIS — L89623 Pressure ulcer of left heel, stage 3: Secondary | ICD-10-CM | POA: Diagnosis not present

## 2021-07-04 DIAGNOSIS — L89513 Pressure ulcer of right ankle, stage 3: Secondary | ICD-10-CM | POA: Diagnosis not present

## 2021-07-04 MED ORDER — FLUTICASONE PROPIONATE 50 MCG/ACT NA SUSP: NASAL | 11 refills | Status: DC

## 2021-07-04 NOTE — Progress Notes (Addendum)
SHATAVIA, SANTOR ([id number]) Visit Report for 07/04/2021 Arrival Information Details Patient Name: [name], [name] Date of Service: 07/04/2021 3:15 PM Medical Record Number: [id number] Patient Account Number: [id number] Date of Birth/Sex: Jan 07, 1928 (87 y.o. F) Treating RN: Donnamarie Poag Primary Care Tysheka Fanguy: Viviana Simpler Other Clinician: Referring Hollie Bartus: Viviana Simpler Treating Ulyana Pitones/Extender: Tito Dine in Treatment: 29 Visit Information History Since Last Visit Added or deleted any medications: No Patient Arrived: [name] Had a fall or experienced change in No Arrival Time: 15:06 activities of daily living that may affect Accompanied By: daughter risk of falls: Transfer Assistance: [employer] Patient Hospitalized since last visit: No Lift Has Dressing in Place as Prescribed: Yes Patient Requires Transmission-Based No Has Compression in Place as Prescribed: Yes Precautions: Pain Present Now: No Patient Has Alerts: Yes Patient Alerts: NOT DIABETIC TBI left .77 TBI right .76 Electronic Signature(s) Signed: 07/04/2021 3:24:26 PM By: Donnamarie Poag Entered By: Donnamarie Poag on 07/04/2021 15:06:21 [name] ([id number]) -------------------------------------------------------------------------------- Encounter Discharge Information Details Patient Name: [name], [name] Date of Service: 07/04/2021 3:15 PM Medical Record Number: [id number] Patient Account Number: [id number] Date of Birth/Sex: [birth year] (87 y.o. F) Treating RN: Donnamarie Poag Primary Care Jaydon Soroka: Viviana Simpler Other Clinician: Referring Mirabella Hilario: Viviana Simpler Treating Alucard Fearnow/Extender: Tito Dine in Treatment: 46 Encounter Discharge Information Items Post Procedure Vitals Discharge Condition: Stable Temperature (F): 98.6 Ambulatory Status: Walker Pulse (bpm): 61 Discharge Destination: Home Respiratory Rate (breaths/min): 16 Transportation: Private Auto Blood  Pressure (mmHg): 133/66 Accompanied By: daughter Schedule Follow-up Appointment: Yes Clinical Summary of Care: Electronic Signature(s) Signed: 07/05/2021 1:20:37 PM By: Donnamarie Poag Entered By: Donnamarie Poag on 07/04/2021 16:00:40 Cecena, [name] ([id number]) -------------------------------------------------------------------------------- Lower Extremity Assessment Details Patient Name: [name], [name] Date of Service: 07/04/2021 3:15 PM Medical Record Number: [id number] Patient Account Number: [id number] Date of Birth/Sex: 1928/06/29 (87 y.o. F) Treating RN: Donnamarie Poag Primary Care Piccola Arico: Viviana Simpler Other Clinician: Referring Norvil Martensen: Viviana Simpler Treating Rylee Nuzum/Extender: Tito Dine in Treatment: 36 Edema Assessment Assessed: [Left: Yes] [Right: Yes] Edema: [Left: No] [Right: No] Calf Left: Right: Point of Measurement: 33 cm From Medial Instep 18.5 cm 19 cm Ankle Left: Right: Point of Measurement: 9 cm From Medial Instep 17 cm 18 cm Vascular Assessment Pulses: Dorsalis Pedis Palpable: [Left:Yes] [Right:Yes] Electronic Signature(s) Signed: 07/04/2021 3:24:26 PM By: Donnamarie Poag Entered By: Donnamarie Poag on 07/04/2021 15:16:17 [place], [name] ([id number]) -------------------------------------------------------------------------------- Multi Wound Chart Details Patient Name: [name] Date of Service: 07/04/2021 3:15 PM Medical Record Number: [id number] Patient Account Number: [id number] Date of Birth/Sex: 1927/10/06 (87 y.o. F) Treating RN: Donnamarie Poag Primary Care Shadd Dunstan: Viviana Simpler Other Clinician: Referring Stormi Vandevelde: Viviana Simpler Treating Tyonna Talerico/Extender: Tito Dine in Treatment: 36 Vital Signs Height(in): 66 Pulse(bpm): 61 Weight(lbs): 15 Blood Pressure(mmHg): 133/66 Body Mass Index(BMI): 12 Temperature(F): 98 Respiratory Rate(breaths/min): 16 Photos: [N/A:N/A] Wound Location: Right, Lateral Malleolus N/A  N/A Wounding Event: Gradually Appeared N/A N/A Primary Etiology: Pressure Ulcer N/A N/A Comorbid History: Arrhythmia, Coronary Artery N/A N/A Disease, Hypertension, Peripheral Arterial Disease, History of pressure wounds, Osteoarthritis Date Acquired: 10/22/2014 N/A N/A Weeks of Treatment: 36 N/A N/A Wound Status: Open N/A N/A Measurements L x W x D (cm) 0.9x1x0.2 N/A N/A Area (cm) : 0.707 N/A N/A Volume (cm) : 0.141 N/A N/A % Reduction in Area: 9.90% N/A N/A % Reduction in Volume: -78.50% N/A N/A Classification: Category/Stage III N/A N/A Exudate Amount: Medium N/A N/A Exudate Type: Serous N/A N/A Exudate Color: amber N/A N/A Wound Margin:  Flat and Intact N/A N/A Granulation Amount: Medium (34-66%) N/A N/A Granulation Quality: Red N/A N/A Necrotic Amount: Medium (34-66%) N/A N/A Exposed Structures: Fat Layer (Subcutaneous Tissue): N/A N/A Yes Fascia: No Tendon: No Muscle: No Joint: No Bone: No Epithelialization: Small (1-33%) N/A N/A Treatment Notes Electronic Signature(s) Signed: 07/04/2021 3:24:26 PM By: Donnamarie Poag Entered By: Donnamarie Poag on 07/04/2021 15:16:32 Heater, [name] ([id number]) -------------------------------------------------------------------------------- [place] Details Patient Name: [name], [name] Date of Service: 07/04/2021 3:15 PM Medical Record Number: [id number] Patient Account Number: [id number] Date of Birth/Sex: October 03, 1927 (87 y.o. F) Treating RN: Donnamarie Poag Primary Care Adeyemi Hamad: Viviana Simpler Other Clinician: Referring Jimie Kuwahara: Viviana Simpler Treating Nalea Salce/Extender: Tito Dine in Treatment: 1 Active Inactive Electronic Signature(s) Signed: 07/04/2021 3:24:26 PM By: Donnamarie Poag Entered By: Donnamarie Poag on 07/04/2021 15:16:23 Kuennen, [name] ([id number]) -------------------------------------------------------------------------------- Pain Assessment Details Patient Name: [name], [name] Date of Service: 07/04/2021 3:15 PM Medical Record Number: [id number] Patient Account Number: [id number] Date of Birth/Sex: 1928-05-26 (87 y.o. F) Treating RN: Donnamarie Poag Primary Care Shanicqua Coldren: Viviana Simpler Other Clinician: Referring Noura Purpura: Viviana Simpler Treating Lorena Benham/Extender: Tito Dine in Treatment: 1 Active Problems Location of Pain Severity and Description of Pain Patient Has Paino No Site Locations Rate the pain. Current Pain Level: 0 Pain Management and Medication Current Pain Management: Electronic Signature(s) Signed: 07/04/2021 3:24:26 PM By: Donnamarie Poag Entered By: Donnamarie Poag on 07/04/2021 15:12:44 Hauschild, [name] ([id number]) -------------------------------------------------------------------------------- Patient/Caregiver Education Details Patient Name: [name] Date of Service: 07/04/2021 3:15 PM Medical Record Number: [id number] Patient Account Number: [id number] Date of Birth/Gender: [birth year] (87 y.o. F) Treating RN: Donnamarie Poag Primary Care Physician: Viviana Simpler Other Clinician: Referring Physician: Viviana Simpler Treating Physician/Extender: Tito Dine in Treatment: 31 Education Assessment Education Provided To: Patient and Caregiver Education Topics Provided Basic Hygiene: Pressure: Wound Debridement: Wound/Skin Impairment: Electronic Signature(s) Signed: 07/05/2021 1:20:37 PM By: Donnamarie Poag Entered By: Donnamarie Poag on 07/04/2021 15:58:03 Ricard, [name] ([id number]) -------------------------------------------------------------------------------- Wound Assessment Details Patient Name: [name], [name] Date of Service: 07/04/2021 3:15 PM Medical Record Number: [id number] Patient Account Number: [id number] Date of Birth/Sex: 06/21/1928 (87 y.o. F) Treating RN: Donnamarie Poag Primary Care Moniqua Engebretsen: Viviana Simpler Other Clinician: Referring Sydney Hasten: Viviana Simpler Treating Isabellah Sobocinski/Extender:  Tito Dine in Treatment: 36 Wound Status Wound Number: 1 Primary Pressure Ulcer Etiology: Wound Location: Right, Lateral Malleolus Wound Open Wounding Event: Gradually Appeared Status: Date Acquired: 10/22/2014 Comorbid Arrhythmia, Coronary Artery Disease, Hypertension, Weeks Of Treatment: 36 History: Peripheral Arterial Disease, History of pressure wounds, Clustered Wound: No Osteoarthritis Photos Wound Measurements Length: (cm) 0.9 Width: (cm) 1 Depth: (cm) 0.2 Area: (cm) 0.707 Volume: (cm) 0.141 % Reduction in Area: 9.9% % Reduction in Volume: -78.5% Epithelialization: Small (1-33%) Tunneling: No Undermining: No Wound Description Classification: Category/Stage III Wound Margin: Flat and Intact Exudate Amount: Medium Exudate Type: Serous Exudate Color: amber Foul Odor After Cleansing: No Slough/Fibrino Yes Wound Bed Granulation Amount: Medium (34-66%) Exposed Structure Granulation Quality: Red Fascia Exposed: No Necrotic Amount: Medium (34-66%) Fat Layer (Subcutaneous Tissue) Exposed: Yes Necrotic Quality: Adherent Slough Tendon Exposed: No Muscle Exposed: No Joint Exposed: No Bone Exposed: No Treatment Notes Wound #1 (Malleolus) Wound Laterality: Right, Lateral Cleanser Soap and Water Discharge Instruction: Gently cleanse wound with antibacterial soap, rinse and pat dry prior to dressing wounds [name], [name] ([id number]) Peri-Wound Care Topical Primary Dressing Prisma 4.34 (in) Discharge Instruction: Moisten w/normal saline or KY jelly--or sterile water; Cover wound as directed. Do  not remove from wound bed. Secondary Dressing Zetuvit Plus Silicone Border Dressing 4x4 (in/in) Secured With Tubigrip Size C, 2.75x10 (in/yd) Discharge Instruction: Apply 3 Tubigrip C 3-finger-widths below knee to base of toes to secure dressing and/or for swelling. Compression Wrap Compression Stockings Add-Ons Electronic Signature(s) Signed: 07/04/2021  3:24:26 PM By: Donnamarie Poag Entered By: Donnamarie Poag on 07/04/2021 15:14:51 [place], [name] ([id number]) -------------------------------------------------------------------------------- [place] Details Patient Name: [name] Date of Service: 07/04/2021 3:15 PM Medical Record Number: [id number] Patient Account Number: [id number] Date of Birth/Sex: 01-Nov-1927 (87 y.o. F) Treating RN: Donnamarie Poag Primary Care Paras Kreider: Viviana Simpler Other Clinician: Referring Kyrene Longan: Viviana Simpler Treating Bilal Manzer/Extender: Tito Dine in Treatment: 19 Vital Signs Time Taken: 15:09 Temperature (F): 98 Height (in): 66 Pulse (bpm): 61 Weight (lbs): 76 Respiratory Rate (breaths/min): 16 Body Mass Index (BMI): 12.3 Blood Pressure (mmHg): 133/66 Reference Range: 80 - 120 mg / dl Electronic Signature(s) Signed: 07/04/2021 3:24:26 PM By: Donnamarie Poag Entered ByDonnamarie Poag on 07/04/2021 15:09:26

## 2021-07-04 NOTE — Addendum Note (Signed)
Addended by: Carter Kitten on: 07/04/2021 04:47 PM   Modules accepted: Orders

## 2021-07-05 DIAGNOSIS — E11621 Type 2 diabetes mellitus with foot ulcer: Secondary | ICD-10-CM | POA: Diagnosis not present

## 2021-07-05 NOTE — Progress Notes (Signed)
TAEKO, SCHAFFER ([id number]) Visit Report for 07/04/2021 Debridement Details Patient Name: [name], [name]. Date of Service: 07/04/2021 3:15 PM Medical Record Number: [id number] Patient Account Number: [id number] Date of Birth/Sex: [birth year] (87 y.o. F) Treating RN: Donnamarie Poag Primary Care Provider: Viviana Simpler Other Clinician: Referring Provider: Viviana Simpler Treating Provider/Extender: Tito Dine in Treatment: 36 Debridement Performed for Wound #1 Right,Lateral Malleolus Assessment: Performed By: Physician Ricard Dillon, MD Debridement Type: Debridement Level of Consciousness (Pre- Awake and Alert procedure): Pre-procedure Verification/Time Out Yes - 15:45 Taken: Start Time: 15:45 Pain Control: Lidocaine Total Area Debrided (L x W): 0.9 (cm) x 1 (cm) = 0.9 (cm) Tissue and other material Viable, Non-Viable, Subcutaneous debrided: Level: Skin/Subcutaneous Tissue Debridement Description: Excisional Instrument: Curette Bleeding: Minimum Hemostasis Achieved: Pressure Response to Treatment: Procedure was tolerated well Level of Consciousness (Post- Awake and Alert procedure): Post Debridement Measurements of Total Wound Length: (cm) 0.9 Stage: Category/Stage III Width: (cm) 1 Depth: (cm) 0.2 Volume: (cm) 0.141 Character of Wound/Ulcer Post Debridement: Improved Post Procedure Diagnosis Same as Pre-procedure Electronic Signature(s) Signed: 07/04/2021 4:21:37 PM By: Linton Ham MD Signed: 07/05/2021 1:20:37 PM By: Donnamarie Poag Entered By: Linton Ham on 07/04/2021 16:20:13 Mastrogiovanni, [name] ([id number]) -------------------------------------------------------------------------------- HPI Details Patient Name: [name], [name] Date of Service: 07/04/2021 3:15 PM Medical Record Number: [id number] Patient Account Number: [id number] Date of Birth/Sex: 08-Jan-1928 (87 y.o. F) Treating RN: Donnamarie Poag Primary Care Provider: Viviana Simpler  Other Clinician: Referring Provider: Viviana Simpler Treating Provider/Extender: Tito Dine in Treatment: 48 History of Present Illness HPI Description: 10/21/2020 upon evaluation today patient presents for initial inspection here in our clinic concerning issues that she has been having quite some time in regard to her ankle and since she has been in the hospital with regard to the heel. The ankle in fact has been 5-6 years at least I am told. With that being said the heel ulcer occurred when she was in the hospital in December for hip surgery when she fractured her hip. She also was in the hospital Tuesday for altered mental status. Really there was nothing that was identified as the cause for this. She did have a. Fortunately there does not appear to be any signs of infection she tells me that she has had she believes arterial studies At [facility] clinic I could not find Those studies at this point. Nonetheless I will continue to look and see what I can find before Then. The patient also has a skin tear on her arm this occurred more recently when she bumped this at home. The patient does have a history of hypertension, coronary artery disease, and peripheral vascular disease stated. 10/28/2020 I was able to find the patient's chart currently which shows that she did have an arterial study performed in May 2019. This showed that she had a abnormal right toe brachial index and a normal left toe brachial index. She was noncompressible as far as ABIs were concerned. She did appear to have triphasic flow at that time. Unfortunately the wound that is commented on in the report that I printed off and read mentions the same wound on the ankle that we are still dealing with at this point. Unfortunately this has not healed. And its been quite sometime about 3 years now. Fortunately there does not appear to be any signs of active infection systemically at this point. I think that the patient  has done well with the Santyl over the past week which is good  news. Patient's caregiver which is her daughter-in-law is concerned about the fact that she really does not feel qualified to be able to change the dressings and take care of this issue. There does not appear to be any signs of anything untoward going on at this point. I think she is done a great job applying the [employer] and I think that has done a great job for the patient is for soften up some of the necrotic tissue. With that being said I think the Xeroform on the arm also has done excellent. In general I am very pleased with where we stand. And I told the patient's daughter-in- law as well that I also feel like she has done a great job over the past week taking care of her mother. 11/11/2020 upon evaluation today patient appears to be doing well with regard to her wounds. She has been tolerating the dressing changes without complication her daughters been applying the Santyl which has done a great job. Ho with that being said I think now that we have good arterial study showing we can go ahead and proceed with sharp debridement at this point. 11/25/2020 upon evaluation today patient appears to be doing well with regard to her wounds. The Santyl has really helped to clean things up and overall she is doing quite excellent at this point. Fortunately there does not appear to be any signs of infection which is great news and in general extremely pleased. I do believe some debridement is in order and hopefully will be able to get her into a collagen dressing after this. 12/23/2020 upon evaluation today patient appears to be doing well with regard to her wound all things considered. Unfortunately it does sound like her daughter decided to let this air out because she felt like it was getting so wet. It sounds like she got border gauze dressings instead of border foam therefore there is really Nothing to catch the excess drainage which I think is  the problem they were worried about the smell. Fortunately there does not appear to be any signs of active infection which is great news. Nonetheless I do not think we want to leave this just open to air. 01/13/2021 upon evaluation today patient's wounds actually appear to be about as good as have seen since have been taking care of her. Fortunately there does not appear to be any evidence of infection which is great and overall the biggest issue I see is that of fluid buildup. I think we need to do something to try to help manage this. I think if we can control her edema we get the wounds to heal more effectively. 01/27/2021 upon evaluation today patient appears to be doing well in regard to the wounds on her right lateral malleolus as well as the left heel and the right ischial tuberosity is unfortunately a new area that has arisen since we last saw her. She tells me currently that that is from sitting too much. With that being said this actually appears to be doing the worst of anything so far that I see today. Fortunately there does not appear to be any signs of infection this is at least good news. Compression wrap seem to be doing excellent for her as far as the legs are concerned. 02/03/2021 upon evaluation today patient appears to be doing well with regard to all of her wounds. She has been tolerating the dressing changes without complication. Fortunately there is no signs of active infection at this  time. No fevers, chills, nausea, vomiting, or diarrhea. 02/10/2021 upon evaluation today patient appears to be doing well with regard to her wounds. With that being said there is some slight evidence of hypergranulation in regard to the right ankle in particular and a little bit in regard to the left heel. I think Hydrofera Blue might be a better option to go to at this point based on what I am seeing especially since both of these wounds in particular seem to be a little bit stalled. I discussed  that with the patient and her daughter today. With regard to the hip this is doing great with the collagen I think will get very close to complete closure I recommend we continue with the collagen. 02/17/2021 upon evaluation today patient appears to be doing excellent in regard to her heel ulcers. She has been tolerating the dressing changes without complication and overall I am extremely pleased with where things stand today. There does not appear to be any signs of active infection which is great news. Overall I may think that we are headed in the proper direction based on what I am seeing. 02/24/2021 upon evaluation today patient's wounds actually are showing signs of improvement in regard to her heels both are doing awesome. In regard to her hip location this is actually reopened after being closed last week and to be perfectly honest I am more concerned here about the fact that pressures causing this issue I do not think it has anything to do with her not having the collagen in place again it was completely healed last week there was no reason for the collagen to be there. 03/03/2021 upon evaluation today patient appears to be doing well with regard to her wounds. Everything is showing signs of improvement and MONTRICE, MONTUORI. ([id number]) doing some much better as far as the overall size of the wound. Fortunately I think we are headed in the right direction. 03/10/2021 upon evaluation today patient appears to be doing well with regard to her wound. She is tolerating the dressing changes without complication. The left heel pretty much appears to be almost completely healed although I think it is probably can be 1 more week before I can call this 100% slough. The right ankle is significantly improved with that being said its not completely closed. With regard to the right hip this is completely closed. Overall I am very pleased with where things stand I think were getting very close to complete  resolution. I discussed all this with the patient and her daughter-in-law today. 03/17/2021 upon evaluation today patient appears to be doing well with regard to her wounds. Fortunately there does not appear to be any signs of active infection which is great news and overall very pleased with where the patient stands today. I am in general thankful that overall she is making great progress here. There is good to be a little bit of debridement here to clear away some of the necrotic debris today on both wound locations. 03/24/2021 upon evaluation today patient appears to be doing excellent in regard to her wound. She has been tolerating the dressing changes without complication. Fortunately there does not appear to be any evidence of infection the left foot is completely healed this is great news. On the right at the ankle region this is still open though I do not think the alginate did quite as well as I was hoping as far as getting this dried out. I think that we may  just want to switch back to the [facility] which has done well up to this point. I was just hopeful this would wrap things up completely for [place]. 03/31/2021 upon evaluation today patient appears to be doing well with regard to her wound. This is on the right side which appears to be doing better than last week I think is definitely measuring smaller and this is great news. Overall I am very pleased with where we stand today. I do believe the [facility] is doing better for her. 04/14/2021 upon evaluation today patient appears to be doing well with regard to her wound. I am very pleased with the way it stands I think that she is headed in the right direction. Unfortunately she is having issues with the Tubigrip neither she nor her daughter-in-law who is the primary caregiver can get this on. Subsequently that means that they have been having a very difficult time with complying with the necessary things for this left leg. With that  being said I am really not sure what to do to help in that regard. We can always try a bigger that is larger size Tubigrip but at the same time that means that she may not get as good compression and it may not keep things under control the only way to know is to try. 04/21/2021 upon evaluation today patient appears to be doing well in regard to her wound. She has been tolerating the dressing changes without complication and in fact the right ankle ulcer is actually showing signs of excellent improvement I am very pleased with where things stand today. No fevers, chills, nausea, vomiting, or diarrhea. 04/28/2021 upon evaluation today patient appears to be doing well with regard to her ankle ulcer. This is actually showing signs of good improvement which is great news and overall very pleased with where we stand today. No fevers, chills, nausea, vomiting, or diarrhea. 05/05/2021 upon evaluation today patient's wound is actually showing signs of significant improvement and overall I am extremely pleased with where we stand today. There does not appear to be any signs of active infection which is great news and overall I am extremely pleased with where we stand at this point. No fevers, chills, nausea, vomiting, or diarrhea. 05/12/2021 upon evaluation today patient appears to be doing okay in regard to her wound is measuring a little bit smaller today that is good news. With that being said she still continues to have significant issues here with an open wound on the lateral aspect of her ankle. Fortunately there does not appear to be any signs of active infection systemically which is great news. No fevers, chills, nausea, vomiting, or diarrhea. 06/13/2021 upon evaluation today patient unfortunately continues to have issues with her right leg. Specifically the ankle region where there is an open wound. Previously the wound on the left leg had completely closed. Nonetheless this left heel has reopened as  well. I am going to perform some debridement in regard to the heel ulcer appears to be a fluid-filled blister underneath this area. She has not been here for such a long time due to the fact that she was very sick and they were not able to bring her out. That is the reason we have not seen her in over the past month. 06/20/2021 upon evaluation patient's wounds currently are doing well on the left heel this appears to be healed. There does not appear to be any signs of drainage at this time which is great  news. No fevers, chills, nausea, vomiting, or diarrhea. On the right heel there is still an open wound but I feel like we are showing some signs of improvement this is still good to take a bit of time. 06/27/2021 upon evaluation today patient appears to be doing better in regard to her wound. This is actually showing signs of good improvement which is great news. Fortunately I do not see any signs of infection currently which is great and overall we are finally seeing some actual decrease in the size I think that the compression wraps are making a big difference here. 12/27 some debris on the surface. No evidence of surrounding infection we have been using collagen Electronic Signature(s) Signed: 07/04/2021 4:21:37 PM By: Linton Ham MD Entered By: Linton Ham on 07/04/2021 16:17:40 Legan, [name] ([id number]) -------------------------------------------------------------------------------- Physical Exam Details Patient Name: LESHA, JAGER Date of Service: 07/04/2021 3:15 PM Medical Record Number: [id number] Patient Account Number: [id number] Date of Birth/Sex: [birth year] (87 y.o. F) Treating RN: Donnamarie Poag Primary Care Provider: Viviana Simpler Other Clinician: Referring Provider: Viviana Simpler Treating Provider/Extender: Tito Dine in Treatment: 8 Constitutional Sitting or standing Blood Pressure is within target range for patient.. Pulse regular and within  target range for patient.[name] Respirations regular, non- labored and within target range.. Temperature is normal and within the target range for the patient.[name] appears in no distress. Notes Wound exam; right lateral malleolus. This required debridement with a #3 curette to remove stubborn fibrinous debris. Cleans up fairly nicely. No evidence of surrounding infection Electronic Signature(s) Signed: 07/04/2021 4:21:37 PM By: Linton Ham MD Entered By: Linton Ham on 07/04/2021 16:18:16 Govoni, [name] ([id number]) -------------------------------------------------------------------------------- Physician Orders Details Patient Name: LABERTA, WILBON Date of Service: 07/04/2021 3:15 PM Medical Record Number: [id number] Patient Account Number: [id number] Date of Birth/Sex: [birth year] (87 y.o. F) Treating RN: Donnamarie Poag Primary Care Provider: Viviana Simpler Other Clinician: Referring Provider: Viviana Simpler Treating Provider/Extender: Tito Dine in Treatment: 21 Verbal / Phone Orders: No Diagnosis Coding Follow-up Appointments o Return Appointment in 1 week. - as soon as able to make appt o Nurse Visit as needed Bathing/ Shower/ Hygiene o May shower with wound dressing protected with water repellent cover or cast protector. o No tub bath. Anesthetic (Use 'Patient Medications' Section for Anesthetic Order Entry) o Lidocaine applied to wound bed Edema Control - Lymphedema / Segmental Compressive Device / Other Bilateral Lower Extremities o Optional: One layer of unna paste to top of compression wrap (to act as an anchor). o 3 Layer Compression System for Lymphedema. - left side abd pad over calc o Elevate legs to the level of the heart and pump ankles as often as possible o Elevate leg(s) parallel to the floor when sitting. o DO YOUR BEST to sleep in the bed at night. DO NOT sleep in your recliner. Long hours of sitting in a recliner leads  to swelling of the legs and/or potential wounds on your backside. Non-Wound Condition o Additional non-wound orders/instructions: - ABD bilateral to pad shins anytime compression wrap is used Additional Orders / Instructions o Follow Nutritious Diet and Increase Protein Intake Wound Treatment Wound #1 - Malleolus Wound Laterality: Right, Lateral Cleanser: Soap and Water Every Other Day/15 Days Discharge Instructions: Gently cleanse wound with antibacterial soap, rinse and pat dry prior to dressing wounds Primary Dressing: Prisma 4.34 (in) Every Other Day/15 Days Discharge Instructions: Moisten w/normal saline or KY jelly--or sterile water; Cover wound as directed. Do not remove from  wound bed. Secondary Dressing: Zetuvit Plus Silicone Border Dressing 4x4 (in/in) Every Other Day/15 Days Secured With: Tubigrip Size C, 2.75x10 (in/yd) Every Other Day/15 Days Discharge Instructions: Apply 3 Tubigrip C 3-finger-widths below knee to base of toes to secure dressing and/or for swelling. Electronic Signature(s) Signed: 07/04/2021 4:21:37 PM By: Linton Ham MD Signed: 07/05/2021 1:20:37 PM By: Donnamarie Poag Entered By: Donnamarie Poag on 07/04/2021 15:50:52 Lizak, [name] ([id number]) -------------------------------------------------------------------------------- Problem List Details Patient Name: ALEINA, BURGIO Date of Service: 07/04/2021 3:15 PM Medical Record Number: [id number] Patient Account Number: [id number] Date of Birth/Sex: 1927-09-03 (87 y.o. F) Treating RN: Donnamarie Poag Primary Care Provider: Viviana Simpler Other Clinician: Referring Provider: Viviana Simpler Treating Provider/Extender: Tito Dine in Treatment: 53 Active Problems ICD-10 Encounter Code Description Active Date MDM Diagnosis L89.513 Pressure ulcer of right ankle, stage 3 10/21/2020 No Yes I73.89 Other specified peripheral vascular diseases 10/21/2020 No Yes L89.623 Pressure ulcer of left heel,  stage 3 10/21/2020 No Yes I10 Essential (primary) hypertension 10/21/2020 No Yes I25.10 Atherosclerotic heart disease of native coronary artery without angina 10/21/2020 No Yes pectoris Inactive Problems Resolved Problems ICD-10 Code Description Active Date Resolved Date S51.802A Unspecified open wound of left forearm, initial encounter 10/21/2020 10/21/2020 L89.312 Pressure ulcer of right buttock, stage 2 01/27/2021 01/27/2021 Electronic Signature(s) Signed: 07/04/2021 4:21:37 PM By: Linton Ham MD Entered By: Linton Ham on 07/04/2021 16:16:59 Koo, [name] ([id number]) -------------------------------------------------------------------------------- Progress Note Details Patient Name: [name] Date of Service: 07/04/2021 3:15 PM Medical Record Number: [id number] Patient Account Number: [id number] Date of Birth/Sex: 06/20/1928 (87 y.o. F) Treating RN: Donnamarie Poag Primary Care Provider: Viviana Simpler Other Clinician: Referring Provider: Viviana Simpler Treating Provider/Extender: Tito Dine in Treatment: 36 Subjective History of Present Illness (HPI) 10/21/2020 upon evaluation today patient presents for initial inspection here in our clinic concerning issues that she has been having quite some time in regard to her ankle and since she has been in the hospital with regard to the heel. The ankle in fact has been 5-6 years at least I am told. With that being said the heel ulcer occurred when she was in the hospital in December for hip surgery when she fractured her hip. She also was in the hospital Tuesday for altered mental status. Really there was nothing that was identified as the cause for this. She did have a. Fortunately there does not appear to be any signs of infection she tells me that she has had she believes arterial studies At [facility] clinic I could not find Those studies at this point. Nonetheless I will continue to look and see what I can find before  Then. The patient also has a skin tear on her arm this occurred more recently when she bumped this at home. The patient does have a history of hypertension, coronary artery disease, and peripheral vascular disease stated. 10/28/2020 I was able to find the patient's chart currently which shows that she did have an arterial study performed in May 2019. This showed that she had a abnormal right toe brachial index and a normal left toe brachial index. She was noncompressible as far as ABIs were concerned. She did appear to have triphasic flow at that time. Unfortunately the wound that is commented on in the report that I printed off and read mentions the same wound on the ankle that we are still dealing with at this point. Unfortunately this has not healed. And its been quite sometime about 3 years now. Fortunately  there does not appear to be any signs of active infection systemically at this point. I think that the patient has done well with the Santyl over the past week which is good news. Patient's caregiver which is her daughter-in-law is concerned about the fact that she really does not feel qualified to be able to change the dressings and take care of this issue. There does not appear to be any signs of anything untoward going on at this point. I think she is done a great job applying the [employer] and I think that has done a great job for the patient is for soften up some of the necrotic tissue. With that being said I think the Xeroform on the arm also has done excellent. In general I am very pleased with where we stand. And I told the patient's daughter-in- law as well that I also feel like she has done a great job over the past week taking care of her mother. 11/11/2020 upon evaluation today patient appears to be doing well with regard to her wounds. She has been tolerating the dressing changes without complication her daughters been applying the Santyl which has done a great job. Ho with that being  said I think now that we have good arterial study showing we can go ahead and proceed with sharp debridement at this point. 11/25/2020 upon evaluation today patient appears to be doing well with regard to her wounds. The Santyl has really helped to clean things up and overall she is doing quite excellent at this point. Fortunately there does not appear to be any signs of infection which is great news and in general extremely pleased. I do believe some debridement is in order and hopefully will be able to get her into a collagen dressing after this. 12/23/2020 upon evaluation today patient appears to be doing well with regard to her wound all things considered. Unfortunately it does sound like her daughter decided to let this air out because she felt like it was getting so wet. It sounds like she got border gauze dressings instead of border foam therefore there is really Nothing to catch the excess drainage which I think is the problem they were worried about the smell. Fortunately there does not appear to be any signs of active infection which is great news. Nonetheless I do not think we want to leave this just open to air. 01/13/2021 upon evaluation today patient's wounds actually appear to be about as good as have seen since have been taking care of her. Fortunately there does not appear to be any evidence of infection which is great and overall the biggest issue I see is that of fluid buildup. I think we need to do something to try to help manage this. I think if we can control her edema we get the wounds to heal more effectively. 01/27/2021 upon evaluation today patient appears to be doing well in regard to the wounds on her right lateral malleolus as well as the left heel and the right ischial tuberosity is unfortunately a new area that has arisen since we last saw her. She tells me currently that that is from sitting too much. With that being said this actually appears to be doing the worst of  anything so far that I see today. Fortunately there does not appear to be any signs of infection this is at least good news. Compression wrap seem to be doing excellent for her as far as the legs are concerned. 02/03/2021 upon  evaluation today patient appears to be doing well with regard to all of her wounds. She has been tolerating the dressing changes without complication. Fortunately there is no signs of active infection at this time. No fevers, chills, nausea, vomiting, or diarrhea. 02/10/2021 upon evaluation today patient appears to be doing well with regard to her wounds. With that being said there is some slight evidence of hypergranulation in regard to the right ankle in particular and a little bit in regard to the left heel. I think Hydrofera Blue might be a better option to go to at this point based on what I am seeing especially since both of these wounds in particular seem to be a little bit stalled. I discussed that with the patient and her daughter today. With regard to the hip this is doing great with the collagen I think will get very close to complete closure I recommend we continue with the collagen. 02/17/2021 upon evaluation today patient appears to be doing excellent in regard to her heel ulcers. She has been tolerating the dressing changes without complication and overall I am extremely pleased with where things stand today. There does not appear to be any signs of active infection which is great news. Overall I may think that we are headed in the proper direction based on what I am seeing. 02/24/2021 upon evaluation today patient's wounds actually are showing signs of improvement in regard to her heels both are doing awesome. In regard to her hip location this is actually reopened after being closed last week and to be perfectly honest I am more concerned here about the fact that pressures causing this issue I do not think it has anything to do with her not having the collagen in  place again it was completely healed last week there was no reason for the collagen to be there. 03/03/2021 upon evaluation today patient appears to be doing well with regard to her wounds. Everything is showing signs of improvement and IRACEMA, LANAGAN. ([id number]) doing some much better as far as the overall size of the wound. Fortunately I think we are headed in the right direction. 03/10/2021 upon evaluation today patient appears to be doing well with regard to her wound. She is tolerating the dressing changes without complication. The left heel pretty much appears to be almost completely healed although I think it is probably can be 1 more week before I can call this 100% slough. The right ankle is significantly improved with that being said its not completely closed. With regard to the right hip this is completely closed. Overall I am very pleased with where things stand I think were getting very close to complete resolution. I discussed all this with the patient and her daughter-in-law today. 03/17/2021 upon evaluation today patient appears to be doing well with regard to her wounds. Fortunately there does not appear to be any signs of active infection which is great news and overall very pleased with where the patient stands today. I am in general thankful that overall she is making great progress here. There is good to be a little bit of debridement here to clear away some of the necrotic debris today on both wound locations. 03/24/2021 upon evaluation today patient appears to be doing excellent in regard to her wound. She has been tolerating the dressing changes without complication. Fortunately there does not appear to be any evidence of infection the left foot is completely healed this is great news. On the right at the  ankle region this is still open though I do not think the alginate did quite as well as I was hoping as far as getting this dried out. I think that we may just want to switch  back to the [employer] which has done well up to this point. I was just hopeful this would wrap things up completely for [place]. 03/31/2021 upon evaluation today patient appears to be doing well with regard to her wound. This is on the right side which appears to be doing better than last week I think is definitely measuring smaller and this is great news. Overall I am very pleased with where we stand today. I do believe the [facility] is doing better for her. 04/14/2021 upon evaluation today patient appears to be doing well with regard to her wound. I am very pleased with the way it stands I think that she is headed in the right direction. Unfortunately she is having issues with the Tubigrip neither she nor her daughter-in-law who is the primary caregiver can get this on. Subsequently that means that they have been having a very difficult time with complying with the necessary things for this left leg. With that being said I am really not sure what to do to help in that regard. We can always try a bigger that is larger size Tubigrip but at the same time that means that she may not get as good compression and it may not keep things under control the only way to know is to try. 04/21/2021 upon evaluation today patient appears to be doing well in regard to her wound. She has been tolerating the dressing changes without complication and in fact the right ankle ulcer is actually showing signs of excellent improvement I am very pleased with where things stand today. No fevers, chills, nausea, vomiting, or diarrhea. 04/28/2021 upon evaluation today patient appears to be doing well with regard to her ankle ulcer. This is actually showing signs of good improvement which is great news and overall very pleased with where we stand today. No fevers, chills, nausea, vomiting, or diarrhea. 05/05/2021 upon evaluation today patient's wound is actually showing signs of significant improvement and overall I am  extremely pleased with where we stand today. There does not appear to be any signs of active infection which is great news and overall I am extremely pleased with where we stand at this point. No fevers, chills, nausea, vomiting, or diarrhea. 05/12/2021 upon evaluation today patient appears to be doing okay in regard to her wound is measuring a little bit smaller today that is good news. With that being said she still continues to have significant issues here with an open wound on the lateral aspect of her ankle. Fortunately there does not appear to be any signs of active infection systemically which is great news. No fevers, chills, nausea, vomiting, or diarrhea. 06/13/2021 upon evaluation today patient unfortunately continues to have issues with her right leg. Specifically the ankle region where there is an open wound. Previously the wound on the left leg had completely closed. Nonetheless this left heel has reopened as well. I am going to perform some debridement in regard to the heel ulcer appears to be a fluid-filled blister underneath this area. She has not been here for such a long time due to the fact that she was very sick and they were not able to bring her out. That is the reason we have not seen her in over the past month. 06/20/2021  upon evaluation patient's wounds currently are doing well on the left heel this appears to be healed. There does not appear to be any signs of drainage at this time which is great news. No fevers, chills, nausea, vomiting, or diarrhea. On the right heel there is still an open wound but I feel like we are showing some signs of improvement this is still good to take a bit of time. 06/27/2021 upon evaluation today patient appears to be doing better in regard to her wound. This is actually showing signs of good improvement which is great news. Fortunately I do not see any signs of infection currently which is great and overall we are finally seeing some actual  decrease in the size I think that the compression wraps are making a big difference here. 12/27 some debris on the surface. No evidence of surrounding infection we have been using collagen Objective Constitutional Sitting or standing Blood Pressure is within target range for patient.. Pulse regular and within target range for patient.[name] Respirations regular, non- labored and within target range.. Temperature is normal and within the target range for the patient.[name] appears in no distress. Vitals Time Taken: 3:09 PM, Height: 66 in, Weight: 76 lbs, BMI: 12.3, Temperature: 98 F, Pulse: 61 bpm, Respiratory Rate: 16 breaths/min, Blood Pressure: 133/66 mmHg. General Notes: Wound exam; right lateral malleolus. This required debridement with a #3 curette to remove stubborn fibrinous debris. Cleans up RUMAISA, SCHNETZER ([id number]) fairly nicely. No evidence of surrounding infection Integumentary (Hair, Skin) Wound #1 status is Open. Original cause of wound was Gradually Appeared. The date acquired was: 10/22/2014. The wound has been in treatment 36 weeks. The wound is located on the Right,Lateral Malleolus. The wound measures 0.9cm length x 1cm width x 0.2cm depth; 0.707cm^2 area and 0.141cm^3 volume. There is Fat Layer (Subcutaneous Tissue) exposed. There is no tunneling or undermining noted. There is a medium amount of serous drainage noted. The wound margin is flat and intact. There is medium (34-66%) red granulation within the wound bed. There is a medium (34-66%) amount of necrotic tissue within the wound bed including Adherent Slough. Assessment Active Problems ICD-10 Pressure ulcer of right ankle, stage 3 Other specified peripheral vascular diseases Pressure ulcer of left heel, stage 3 Essential (primary) hypertension Atherosclerotic heart disease of native coronary artery without angina pectoris Procedures Wound #1 Pre-procedure diagnosis of Wound #1 is a Pressure Ulcer located on the  Right,Lateral Malleolus . There was a Excisional Skin/Subcutaneous Tissue Debridement with a total area of 0.9 sq cm performed by Ricard Dillon, MD. With the following instrument(s): Curette to remove Viable and Non-Viable tissue/material. Material removed includes Subcutaneous Tissue after achieving pain control using Lidocaine. A time out was conducted at 15:45, prior to the start of the procedure. A Minimum amount of bleeding was controlled with Pressure. The procedure was tolerated well. Post Debridement Measurements: 0.9cm length x 1cm width x 0.2cm depth; 0.141cm^3 volume. Post debridement Stage noted as Category/Stage III. Character of Wound/Ulcer Post Debridement is improved. Post procedure Diagnosis Wound #1: Same as Pre-Procedure Plan Follow-up Appointments: Return Appointment in 1 week. - as soon as able to make appt Nurse Visit as needed Bathing/ Shower/ Hygiene: May shower with wound dressing protected with water repellent cover or cast protector. No tub bath. Anesthetic (Use 'Patient Medications' Section for Anesthetic Order Entry): Lidocaine applied to wound bed Edema Control - Lymphedema / Segmental Compressive Device / Other: Optional: One layer of unna paste to top of compression wrap (to  act as an anchor). 3 Layer Compression System for Lymphedema. - left side abd pad over calc Elevate legs to the level of the heart and pump ankles as often as possible Elevate leg(s) parallel to the floor when sitting. DO YOUR BEST to sleep in the bed at night. DO NOT sleep in your recliner. Long hours of sitting in a recliner leads to swelling of the legs and/or potential wounds on your backside. Non-Wound Condition: Additional non-wound orders/instructions: - ABD bilateral to pad shins anytime compression wrap is used Additional Orders / Instructions: Follow Nutritious Diet and Increase Protein Intake WOUND #1: - Malleolus Wound Laterality: Right, Lateral Cleanser: Soap and  Water Every Other Day/15 Days Discharge Instructions: Gently cleanse wound with antibacterial soap, rinse and pat dry prior to dressing wounds Primary Dressing: Prisma 4.34 (in) Every Other Day/15 Days Discharge Instructions: Moisten w/normal saline or KY jelly--or sterile water; Cover wound as directed. Do not remove from wound bed. Secondary Dressing: Zetuvit Plus Silicone Border Dressing 4x4 (in/in) Every Other Day/15 Days Secured With: Tubigrip Size C, 2.75x10 (in/yd) Every Other Day/15 Days Discharge Instructions: Apply 3 Tubigrip C 3-finger-widths below knee to base of toes to secure dressing and/or for swelling. BERNARDINE, LANGWORTHY ([id number]) 1. I continued the Prisma 2. Change every second day with K-Y jelly 3. The patient will not be able to return next week as her daughter-in-law cannot bring her. We therefore used Tubigrip. The daughter-in-law will need to change the dressing. She will not be back for 2 weeks Electronic Signature(s) Signed: 07/04/2021 4:21:37 PM By: Linton Ham MD Entered By: Linton Ham on 07/04/2021 16:19:31 Olesky, [name] ([id number]) -------------------------------------------------------------------------------- SuperBill Details Patient Name: ALEXZANDREA, NORMINGTON Date of Service: 07/04/2021 Medical Record Number: [id number] Patient Account Number: [id number] Date of Birth/Sex: Aug 15, 1927 (87 y.o. F) Treating RN: Donnamarie Poag Primary Care Provider: Viviana Simpler Other Clinician: Referring Provider: Viviana Simpler Treating Provider/Extender: Tito Dine in Treatment: 36 Diagnosis Coding ICD-10 Codes Code Description L89.513 Pressure ulcer of right ankle, stage 3 I73.89 Other specified peripheral vascular diseases L89.623 Pressure ulcer of left heel, stage 3 I10 Essential (primary) hypertension I25.10 Atherosclerotic heart disease of native coronary artery without angina pectoris Facility Procedures CPT4 Code: 08657846 Description:  96295 - DEB SUBQ TISSUE 20 SQ CM/< Modifier: Quantity: 1 CPT4 Code: Description: ICD-10 Diagnosis Description L89.513 Pressure ulcer of right ankle, stage 3 Modifier: Quantity: Physician Procedures CPT4 Code: 2841324 Description: 11042 - WC PHYS SUBQ TISS 20 SQ CM Modifier: Quantity: 1 CPT4 Code: Description: ICD-10 Diagnosis Description L89.513 Pressure ulcer of right ankle, stage 3 Modifier: Quantity: Electronic Signature(s) Signed: 07/04/2021 4:21:37 PM By: Linton Ham MD Entered By: Linton Ham on 07/04/2021 16:19:46

## 2021-07-10 ENCOUNTER — Other Ambulatory Visit: Payer: Self-pay | Admitting: Internal Medicine

## 2021-07-11 NOTE — Telephone Encounter (Signed)
Name of Medication: Morphine Sulfate Name of [facility]acy: [facility] or Written Date and Quantity: 05-09-21 #90 Last Office Visit and Type: 01-16-21-22 Next Office Visit and Type: 08-17-21 Last Controlled Substance Agreement Date: 09-29-20 Last UDS: 04-27-19

## 2021-07-12 DIAGNOSIS — Z515 Encounter for palliative care: Secondary | ICD-10-CM | POA: Diagnosis not present

## 2021-07-12 DIAGNOSIS — H5461 Unqualified visual loss, right eye, normal vision left eye: Secondary | ICD-10-CM | POA: Diagnosis not present

## 2021-07-12 DIAGNOSIS — E46 Unspecified protein-calorie malnutrition: Secondary | ICD-10-CM | POA: Diagnosis not present

## 2021-07-12 DIAGNOSIS — D692 Other nonthrombocytopenic purpura: Secondary | ICD-10-CM | POA: Diagnosis not present

## 2021-07-12 DIAGNOSIS — Z681 Body mass index (BMI) 19 or less, adult: Secondary | ICD-10-CM | POA: Diagnosis not present

## 2021-07-12 DIAGNOSIS — F339 Major depressive disorder, recurrent, unspecified: Secondary | ICD-10-CM | POA: Diagnosis not present

## 2021-07-12 DIAGNOSIS — G8929 Other chronic pain: Secondary | ICD-10-CM | POA: Diagnosis not present

## 2021-07-12 DIAGNOSIS — Z9889 Other specified postprocedural states: Secondary | ICD-10-CM | POA: Diagnosis not present

## 2021-07-12 DIAGNOSIS — Z8619 Personal history of other infectious and parasitic diseases: Secondary | ICD-10-CM | POA: Diagnosis not present

## 2021-07-12 DIAGNOSIS — L89510 Pressure ulcer of right ankle, unstageable: Secondary | ICD-10-CM | POA: Diagnosis not present

## 2021-07-21 ENCOUNTER — Other Ambulatory Visit: Payer: Self-pay

## 2021-07-21 ENCOUNTER — Encounter: Payer: PPO | Attending: Physician Assistant | Admitting: Physician Assistant

## 2021-07-21 DIAGNOSIS — I739 Peripheral vascular disease, unspecified: Secondary | ICD-10-CM | POA: Insufficient documentation

## 2021-07-21 DIAGNOSIS — L89513 Pressure ulcer of right ankle, stage 3: Secondary | ICD-10-CM | POA: Insufficient documentation

## 2021-07-21 DIAGNOSIS — I7389 Other specified peripheral vascular diseases: Secondary | ICD-10-CM | POA: Diagnosis not present

## 2021-07-21 DIAGNOSIS — I10 Essential (primary) hypertension: Secondary | ICD-10-CM | POA: Insufficient documentation

## 2021-07-21 DIAGNOSIS — I2510 Atherosclerotic heart disease of native coronary artery without angina pectoris: Secondary | ICD-10-CM | POA: Diagnosis not present

## 2021-07-21 DIAGNOSIS — L89623 Pressure ulcer of left heel, stage 3: Secondary | ICD-10-CM | POA: Diagnosis not present

## 2021-07-21 NOTE — Progress Notes (Signed)
LIN, HACKMANN ([id number]) Visit Report for 07/21/2021 Arrival Information Details Patient Name: [name], [name] Date of Service: 07/21/2021 3:15 PM Medical Record Number: [id number] Patient Account Number: [id number] Date of Birth/Sex: Aug 12, 1927 (88 y.o. F) Treating RN: Donnamarie Poag Primary Care Moritz Lever: Viviana Simpler Other Clinician: Referring Jamaya Sleeth: Viviana Simpler Treating Girtie Wiersma/Extender: Skipper Cliche in Treatment: 39 Visit Information History Since Last Visit Added or deleted any medications: No Patient Arrived: [name] Had a fall or experienced change in No Arrival Time: 15:16 activities of daily living that may affect Accompanied By: daughter risk of falls: Transfer Assistance: None Hospitalized since last visit: No Patient Identification Verified: Yes Has Dressing in Place as Prescribed: Yes Secondary Verification Process Completed: Yes Pain Present Now: No Patient Requires Transmission-Based Precautions: No Patient Has Alerts: Yes Patient Alerts: NOT DIABETIC TBI left .77 TBI right .76 Electronic Signature(s) Signed: 07/21/2021 4:02:12 PM By: Donnamarie Poag Entered By: Donnamarie Poag on 07/21/2021 15:17:28 Iwai, [name] ([id number]) -------------------------------------------------------------------------------- Clinic Level of Care Assessment Details Patient Name: [name], [name] Date of Service: 07/21/2021 3:15 PM Medical Record Number: [id number] Patient Account Number: [id number] Date of Birth/Sex: [birth year] (88 y.o. F) Treating RN: Donnamarie Poag Primary Care Greely Atiyeh: Viviana Simpler Other Clinician: Referring Marisabel Macpherson: Viviana Simpler Treating Yetta Marceaux/Extender: Skipper Cliche in Treatment: 39 Clinic Level of Care Assessment Items TOOL 4 Quantity Score []  - Use when only an EandM is performed on FOLLOW-UP visit 0 ASSESSMENTS - Nursing Assessment / Reassessment []  - Reassessment of Co-morbidities (includes updates in patient status) 0 []  -  0 Reassessment of Adherence to Treatment Plan ASSESSMENTS - Wound and Skin Assessment / Reassessment X - Simple Wound Assessment / Reassessment - one wound 1 5 []  - 0 Complex Wound Assessment / Reassessment - multiple wounds []  - 0 Dermatologic / Skin Assessment (not related to wound area) ASSESSMENTS - Focused Assessment []  - Circumferential Edema Measurements - multi extremities 0 []  - 0 Nutritional Assessment / Counseling / Intervention []  - 0 Lower Extremity Assessment (monofilament, tuning fork, pulses) []  - 0 Peripheral Arterial Disease Assessment (using hand held doppler) ASSESSMENTS - Ostomy and/or Continence Assessment and Care []  - Incontinence Assessment and Management 0 []  - 0 Ostomy Care Assessment and Management (repouching, etc.) PROCESS - Coordination of Care X - Simple Patient / Family Education for ongoing care 1 15 []  - 0 Complex (extensive) Patient / Family Education for ongoing care []  - 0 Staff obtains Programmer, systems, Records, Test Results / Process Orders []  - 0 Staff telephones HHA, Nursing Homes / Clarify orders / etc []  - 0 Routine Transfer to another Facility (non-emergent condition) []  - 0 Routine Hospital Admission (non-emergent condition) []  - 0 New Admissions / Biomedical engineer / Ordering NPWT, Apligraf, etc. []  - 0 Emergency Hospital Admission (emergent condition) X- 1 10 Simple Discharge Coordination []  - 0 Complex (extensive) Discharge Coordination PROCESS - Special Needs []  - Pediatric / Minor Patient Management 0 []  - 0 Isolation Patient Management []  - 0 Hearing / Language / Visual special needs []  - 0 Assessment of Community assistance (transportation, D/C planning, etc.) []  - 0 Additional assistance / Altered mentation []  - 0 Support Surface(s) Assessment (bed, cushion, seat, etc.) INTERVENTIONS - Wound Cleansing / Measurement ESTELLA, MALATESTA ([id number]) X- 1 5 Simple Wound Cleansing - one wound []  - 0 Complex Wound  Cleansing - multiple wounds X- 1 5 Wound Imaging (photographs - any number of wounds) []  - 0 Wound Tracing (instead of photographs) X- 1 5 Simple Wound Measurement -  one wound []  - 0 Complex Wound Measurement - multiple wounds INTERVENTIONS - Wound Dressings X - Small Wound Dressing one or multiple wounds 1 10 []  - 0 Medium Wound Dressing one or multiple wounds []  - 0 Large Wound Dressing one or multiple wounds X- 1 5 Application of Medications - topical []  - 0 Application of Medications - injection INTERVENTIONS - Miscellaneous []  - External ear exam 0 []  - 0 Specimen Collection (cultures, biopsies, blood, body fluids, etc.) []  - 0 Specimen(s) / Culture(s) sent or taken to Lab for analysis []  - 0 Patient Transfer (multiple staff / Civil Service fast streamer / Similar devices) []  - 0 Simple Staple / Suture removal (25 or less) []  - 0 Complex Staple / Suture removal (26 or more) []  - 0 Hypo / Hyperglycemic Management (close monitor of Blood Glucose) []  - 0 Ankle / Brachial Index (ABI) - do not check if billed separately X- 1 5 Vital Signs Has the patient been seen at the hospital within the last three years: Yes Total Score: 65 Level Of Care: New/Established - Level 2 Electronic Signature(s) Signed: 07/21/2021 4:02:12 PM By: Donnamarie Poag Entered By: Donnamarie Poag on 07/21/2021 15:41:19 Fye, [name] ([id number]) -------------------------------------------------------------------------------- Encounter Discharge Information Details Patient Name: [name], [name] Date of Service: 07/21/2021 3:15 PM Medical Record Number: [id number] Patient Account Number: [id number] Date of Birth/Sex: 12/17/1927 (88 y.o. F) Treating RN: Donnamarie Poag Primary Care Mathias Bogacki: Viviana Simpler Other Clinician: Referring Aladdin Kollmann: Viviana Simpler Treating Alizah Sills/Extender: Skipper Cliche in Treatment: 80 Encounter Discharge Information Items Discharge Condition: Stable Ambulatory Status:  Walker Discharge Destination: Home Transportation: Private Auto Accompanied By: daughter in law Schedule Follow-up Appointment: Yes Clinical Summary of Care: Electronic Signature(s) Signed: 07/21/2021 4:02:12 PM By: Donnamarie Poag Entered By: Donnamarie Poag on 07/21/2021 15:51:47 Mckeough, [name] ([id number]) -------------------------------------------------------------------------------- Lower Extremity Assessment Details Patient Name: [name], [name] Date of Service: 07/21/2021 3:15 PM Medical Record Number: [id number] Patient Account Number: [id number] Date of Birth/Sex: 12-Sep-1927 (88 y.o. F) Treating RN: Donnamarie Poag Primary Care Jerryl Holzhauer: Viviana Simpler Other Clinician: Referring Iam Lipson: Viviana Simpler Treating Payslie Mccaig/Extender: Skipper Cliche in Treatment: 39 Edema Assessment Assessed: [Left: No] Patrice Paradise: Yes] [Left: Edema] [Right: :] Calf Left: Right: Point of Measurement: 33 cm From Medial Instep 26 cm Ankle Left: Right: Point of Measurement: 9 cm From Medial Instep 19.5 cm Vascular Assessment Pulses: Dorsalis Pedis Palpable: [Right:Yes] Electronic Signature(s) Signed: 07/21/2021 4:02:12 PM By: Donnamarie Poag Entered By: Donnamarie Poag on 07/21/2021 15:24:45 Mcparland, [name] ([id number]) -------------------------------------------------------------------------------- Multi Wound Chart Details Patient Name: [name] Date of Service: 07/21/2021 3:15 PM Medical Record Number: [id number] Patient Account Number: [id number] Date of Birth/Sex: 1928/01/14 (88 y.o. F) Treating RN: Donnamarie Poag Primary Care Michala Deblanc: Viviana Simpler Other Clinician: Referring Nai Borromeo: Viviana Simpler Treating Kinsly Hild/Extender: Skipper Cliche in Treatment: 39 Vital Signs Height(in): 66 Pulse(bpm): 74 Weight(lbs): 55 Blood Pressure(mmHg): 175/91 Body Mass Index(BMI): 12 Temperature(F): 98.2 Respiratory Rate(breaths/min): 16 Photos: [N/A:N/A] Wound Location: Right, Lateral  Malleolus N/A N/A Wounding Event: Gradually Appeared N/A N/A Primary Etiology: Pressure Ulcer N/A N/A Comorbid History: Arrhythmia, Coronary Artery N/A N/A Disease, Hypertension, Peripheral Arterial Disease, History of pressure wounds, Osteoarthritis Date Acquired: 10/22/2014 N/A N/A Weeks of Treatment: 39 N/A N/A Wound Status: Open N/A N/A Measurements L x W x D (cm) 0.7x0.8x0.2 N/A N/A Area (cm) : 0.44 N/A N/A Volume (cm) : 0.088 N/A N/A % Reduction in Area: 43.90% N/A N/A % Reduction in Volume: -11.40% N/A N/A Classification: Category/Stage III N/A N/A Exudate Amount:  Medium N/A N/A Exudate Type: Serous N/A N/A Exudate Color: amber N/A N/A Wound Margin: Flat and Intact N/A N/A Granulation Amount: Medium (34-66%) N/A N/A Granulation Quality: Red, Hyper-granulation N/A N/A Necrotic Amount: Medium (34-66%) N/A N/A Exposed Structures: Fat Layer (Subcutaneous Tissue): N/A N/A Yes Fascia: No Tendon: No Muscle: No Joint: No Bone: No Epithelialization: Small (1-33%) N/A N/A Treatment Notes Electronic Signature(s) Signed: 07/21/2021 4:02:12 PM By: Donnamarie Poag Entered By: Donnamarie Poag on 07/21/2021 15:25:05 [name] ([id number]) -------------------------------------------------------------------------------- [place] Details Patient Name: [name], [name] Date of Service: 07/21/2021 3:15 PM Medical Record Number: [id number] Patient Account Number: [id number] Date of Birth/Sex: January 30, 1928 (88 y.o. F) Treating RN: Donnamarie Poag Primary Care Charmine Bockrath: Viviana Simpler Other Clinician: Referring Less Woolsey: Viviana Simpler Treating Erasmo Vertz/Extender: Skipper Cliche in Treatment: 76 Active Inactive Electronic Signature(s) Signed: 07/21/2021 4:02:12 PM By: Donnamarie Poag Entered By: Donnamarie Poag on 07/21/2021 15:24:54 Quam, [name] ([id number]) -------------------------------------------------------------------------------- Pain Assessment Details Patient  Name: LAWANDA, HOLZHEIMER Date of Service: 07/21/2021 3:15 PM Medical Record Number: [id number] Patient Account Number: [id number] Date of Birth/Sex: 1927-09-03 (88 y.o. F) Treating RN: Donnamarie Poag Primary Care Johnni Wunschel: Viviana Simpler Other Clinician: Referring Hildur Bayer: Viviana Simpler Treating Jaymian Bogart/Extender: Skipper Cliche in Treatment: 39 Active Problems Location of Pain Severity and Description of Pain Patient Has Paino No Site Locations Rate the pain. Current Pain Level: 0 Pain Management and Medication Current Pain Management: Electronic Signature(s) Signed: 07/21/2021 4:02:12 PM By: Donnamarie Poag Entered By: Donnamarie Poag on 07/21/2021 15:18:02 [name] ([id number]) -------------------------------------------------------------------------------- Patient/Caregiver Education Details Patient Name: [name] Date of Service: 07/21/2021 3:15 PM Medical Record Number: [id number] Patient Account Number: [id number] Date of Birth/Gender: 1928/03/17 (88 y.o. F) Treating RN: Donnamarie Poag Primary Care Physician: Viviana Simpler Other Clinician: Referring Physician: Viviana Simpler Treating Physician/Extender: Skipper Cliche in Treatment: 17 Education Assessment Education Provided To: Patient and Caregiver Education Topics Provided Basic Hygiene: Venous: Wound/Skin Impairment: Engineer, maintenance) Signed: 07/21/2021 4:02:12 PM By: Donnamarie Poag Entered By: Donnamarie Poag on 07/21/2021 15:39:23 Ran, [name] ([id number]) -------------------------------------------------------------------------------- Wound Assessment Details Patient Name: HULDA, [name] Date of Service: 07/21/2021 3:15 PM Medical Record Number: [id number] Patient Account Number: [id number] Date of Birth/Sex: 04-Jan-1928 (88 y.o. F) Treating RN: Donnamarie Poag Primary Care Lekisha Mcghee: Viviana Simpler Other Clinician: Referring Mariene Dickerman: Viviana Simpler Treating Yani Coventry/Extender: Skipper Cliche in  Treatment: 39 Wound Status Wound Number: 1 Primary Pressure Ulcer Etiology: Wound Location: Right, Lateral Malleolus Wound Open Wounding Event: Gradually Appeared Status: Date Acquired: 10/22/2014 Comorbid Arrhythmia, Coronary Artery Disease, Hypertension, Weeks Of Treatment: 39 History: Peripheral Arterial Disease, History of pressure wounds, Clustered Wound: No Osteoarthritis Photos Wound Measurements Length: (cm) 0.7 Width: (cm) 0.8 Depth: (cm) 0.2 Area: (cm) 0.44 Volume: (cm) 0.088 % Reduction in Area: 43.9% % Reduction in Volume: -11.4% Epithelialization: Small (1-33%) Tunneling: No Undermining: No Wound Description Classification: Category/Stage III Wound Margin: Flat and Intact Exudate Amount: Medium Exudate Type: Serous Exudate Color: amber Foul Odor After Cleansing: No Slough/Fibrino Yes Wound Bed Granulation Amount: Medium (34-66%) Exposed Structure Granulation Quality: Red, Hyper-granulation Fascia Exposed: No Necrotic Amount: Medium (34-66%) Fat Layer (Subcutaneous Tissue) Exposed: Yes Necrotic Quality: Adherent Slough Tendon Exposed: No Muscle Exposed: No Joint Exposed: No Bone Exposed: No Treatment Notes Wound #1 (Malleolus) Wound Laterality: Right, Lateral Cleanser Soap and Water Discharge Instruction: Gently cleanse wound with antibacterial soap, rinse and pat dry prior to dressing wounds SYRAI, GLADWIN ([id number]) Peri-Wound Care Topical Primary Dressing Prisma 4.34 (in) Discharge Instruction: Moisten w/normal  saline or KY jelly--or sterile water; Cover wound as directed. Do not remove from wound bed. Secondary Dressing Zetuvit Plus Silicone Border Dressing 4x4 (in/in) Secured With Tubigrip Size C, 2.75x10 (in/yd) Discharge Instruction: Apply 3 Tubigrip C 3-finger-widths below knee to base of toes to secure dressing and/or for swelling. Compression Wrap Compression Stockings Add-Ons Electronic Signature(s) Signed: 07/21/2021 4:02:12  PM By: Donnamarie Poag Entered By: Donnamarie Poag on 07/21/2021 15:22:12 [name] ([id number]) -------------------------------------------------------------------------------- Vitals Details Patient Name: [name] Date of Service: 07/21/2021 3:15 PM Medical Record Number: [id number] Patient Account Number: [id number] Date of Birth/Sex: March 18, 1928 (88 y.o. F) Treating RN: Donnamarie Poag Primary Care Katianna Mcclenney: Viviana Simpler Other Clinician: Referring Devanee Pomplun: Viviana Simpler Treating Arlena Marsan/Extender: Skipper Cliche in Treatment: 39 Vital Signs Time Taken: 15:17 Temperature (F): 98.2 Height (in): 66 Pulse (bpm): 74 Weight (lbs): 76 Respiratory Rate (breaths/min): 16 Body Mass Index (BMI): 12.3 Blood Pressure (mmHg): 175/91 Reference Range: 80 - 120 mg / dl Electronic Signature(s) Signed: 07/21/2021 4:02:12 PM By: Donnamarie Poag Entered ByDonnamarie Poag on 07/21/2021 15:17:54

## 2021-07-21 NOTE — Progress Notes (Addendum)
KARYSA, HEFT ([id number]) Visit Report for 07/21/2021 Chief Complaint Document Details Patient Name: [name], [name]. Date of Service: 07/21/2021 3:15 PM Medical Record Number: [id number] Patient Account Number: [id number] Date of Birth/Sex: [birth year] (88 y.o. F) Treating RN: Donnamarie Poag Primary Care Provider: Viviana Simpler Other Clinician: Referring Provider: Viviana Simpler Treating Provider/Extender: Skipper Cliche in Treatment: 30 Information Obtained from: Patient Chief Complaint Pressure ulcer right ankle and left heel Electronic Signature(s) Signed: 07/21/2021 3:37:18 PM By: Worthy Keeler PA-C Entered By: Worthy Keeler on 07/21/2021 15:37:18 Delo, [name] ([id number]) -------------------------------------------------------------------------------- HPI Details Patient Name: [name], [name] Date of Service: 07/21/2021 3:15 PM Medical Record Number: [id number] Patient Account Number: [id number] Date of Birth/Sex: [birth year] (88 y.o. F) Treating RN: Donnamarie Poag Primary Care Provider: Viviana Simpler Other Clinician: Referring Provider: Viviana Simpler Treating Provider/Extender: Skipper Cliche in Treatment: 68 History of Present Illness HPI Description: 10/21/2020 upon evaluation today patient presents for initial inspection here in our clinic concerning issues that she has been having quite some time in regard to her ankle and since she has been in the hospital with regard to the heel. The ankle in fact has been 5-6 years at least I am told. With that being said the heel ulcer occurred when she was in the hospital in December for hip surgery when she fractured her hip. She also was in the hospital Tuesday for altered mental status. Really there was nothing that was identified as the cause for this. She did have a. Fortunately there does not appear to be any signs of infection she tells me that she has had she believes arterial studies At [facility] clinic I could not  find Those studies at this point. Nonetheless I will continue to look and see what I can find before Then. The patient also has a skin tear on her arm this occurred more recently when she bumped this at home. The patient does have a history of hypertension, coronary artery disease, and peripheral vascular disease stated. 10/28/2020 I was able to find the patient's chart currently which shows that she did have an arterial study performed in May 2019. This showed that she had a abnormal right toe brachial index and a normal left toe brachial index. She was noncompressible as far as ABIs were concerned. She did appear to have triphasic flow at that time. Unfortunately the wound that is commented on in the report that I printed off and read mentions the same wound on the ankle that we are still dealing with at this point. Unfortunately this has not healed. And its been quite sometime about 3 years now. Fortunately there does not appear to be any signs of active infection systemically at this point. I think that the patient has done well with the Santyl over the past week which is good news. Patient's caregiver which is her daughter-in-law is concerned about the fact that she really does not feel qualified to be able to change the dressings and take care of this issue. There does not appear to be any signs of anything untoward going on at this point. I think she is done a great job applying the [employer] and I think that has done a great job for the patient is for soften up some of the necrotic tissue. With that being said I think the Xeroform on the arm also has done excellent. In general I am very pleased with where we stand. And I told the patient's daughter-in- law as well that  I also feel like she has done a great job over the past week taking care of her mother. 11/11/2020 upon evaluation today patient appears to be doing well with regard to her wounds. She has been tolerating the dressing changes without  complication her daughters been applying the Santyl which has done a great job. Ho with that being said I think now that we have good arterial study showing we can go ahead and proceed with sharp debridement at this point. 11/25/2020 upon evaluation today patient appears to be doing well with regard to her wounds. The Santyl has really helped to clean things up and overall she is doing quite excellent at this point. Fortunately there does not appear to be any signs of infection which is great news and in general extremely pleased. I do believe some debridement is in order and hopefully will be able to get her into a collagen dressing after this. 12/23/2020 upon evaluation today patient appears to be doing well with regard to her wound all things considered. Unfortunately it does sound like her daughter decided to let this air out because she felt like it was getting so wet. It sounds like she got border gauze dressings instead of border foam therefore there is really Nothing to catch the excess drainage which I think is the problem they were worried about the smell. Fortunately there does not appear to be any signs of active infection which is great news. Nonetheless I do not think we want to leave this just open to air. 01/13/2021 upon evaluation today patient's wounds actually appear to be about as good as have seen since have been taking care of her. Fortunately there does not appear to be any evidence of infection which is great and overall the biggest issue I see is that of fluid buildup. I think we need to do something to try to help manage this. I think if we can control her edema we get the wounds to heal more effectively. 01/27/2021 upon evaluation today patient appears to be doing well in regard to the wounds on her right lateral malleolus as well as the left heel and the right ischial tuberosity is unfortunately a new area that has arisen since we last saw her. She tells me currently that that is  from sitting too much. With that being said this actually appears to be doing the worst of anything so far that I see today. Fortunately there does not appear to be any signs of infection this is at least good news. Compression wrap seem to be doing excellent for her as far as the legs are concerned. 02/03/2021 upon evaluation today patient appears to be doing well with regard to all of her wounds. She has been tolerating the dressing changes without complication. Fortunately there is no signs of active infection at this time. No fevers, chills, nausea, vomiting, or diarrhea. 02/10/2021 upon evaluation today patient appears to be doing well with regard to her wounds. With that being said there is some slight evidence of hypergranulation in regard to the right ankle in particular and a little bit in regard to the left heel. I think Hydrofera Blue might be a better option to go to at this point based on what I am seeing especially since both of these wounds in particular seem to be a little bit stalled. I discussed that with the patient and her daughter today. With regard to the hip this is doing great with the collagen I think will get  very close to complete closure I recommend we continue with the collagen. 02/17/2021 upon evaluation today patient appears to be doing excellent in regard to her heel ulcers. She has been tolerating the dressing changes without complication and overall I am extremely pleased with where things stand today. There does not appear to be any signs of active infection which is great news. Overall I may think that we are headed in the proper direction based on what I am seeing. 02/24/2021 upon evaluation today patient's wounds actually are showing signs of improvement in regard to her heels both are doing awesome. In regard to her hip location this is actually reopened after being closed last week and to be perfectly honest I am more concerned here about the fact that pressures  causing this issue I do not think it has anything to do with her not having the collagen in place again it was completely healed last week there was no reason for the collagen to be there. 03/03/2021 upon evaluation today patient appears to be doing well with regard to her wounds. Everything is showing signs of improvement and [name], [name]. ([id number]) doing some much better as far as the overall size of the wound. Fortunately I think we are headed in the right direction. 03/10/2021 upon evaluation today patient appears to be doing well with regard to her wound. She is tolerating the dressing changes without complication. The left heel pretty much appears to be almost completely healed although I think it is probably can be 1 more week before I can call this 100% slough. The right ankle is significantly improved with that being said its not completely closed. With regard to the right hip this is completely closed. Overall I am very pleased with where things stand I think were getting very close to complete resolution. I discussed all this with the patient and her daughter-in-law today. 03/17/2021 upon evaluation today patient appears to be doing well with regard to her wounds. Fortunately there does not appear to be any signs of active infection which is great news and overall very pleased with where the patient stands today. I am in general thankful that overall she is making great progress here. There is good to be a little bit of debridement here to clear away some of the necrotic debris today on both wound locations. 03/24/2021 upon evaluation today patient appears to be doing excellent in regard to her wound. She has been tolerating the dressing changes without complication. Fortunately there does not appear to be any evidence of infection the left foot is completely healed this is great news. On the right at the ankle region this is still open though I do not think the alginate did quite as well  as I was hoping as far as getting this dried out. I think that we may just want to switch back to the [facility] which has done well up to this point. I was just hopeful this would wrap things up completely for [place]. 03/31/2021 upon evaluation today patient appears to be doing well with regard to her wound. This is on the right side which appears to be doing better than last week I think is definitely measuring smaller and this is great news. Overall I am very pleased with where we stand today. I do believe the [facility] is doing better for her. 04/14/2021 upon evaluation today patient appears to be doing well with regard to her wound. I am very pleased with the way it  stands I think that she is headed in the right direction. Unfortunately she is having issues with the Tubigrip neither she nor her daughter-in-law who is the primary caregiver can get this on. Subsequently that means that they have been having a very difficult time with complying with the necessary things for this left leg. With that being said I am really not sure what to do to help in that regard. We can always try a bigger that is larger size Tubigrip but at the same time that means that she may not get as good compression and it may not keep things under control the only way to know is to try. 04/21/2021 upon evaluation today patient appears to be doing well in regard to her wound. She has been tolerating the dressing changes without complication and in fact the right ankle ulcer is actually showing signs of excellent improvement I am very pleased with where things stand today. No fevers, chills, nausea, vomiting, or diarrhea. 04/28/2021 upon evaluation today patient appears to be doing well with regard to her ankle ulcer. This is actually showing signs of good improvement which is great news and overall very pleased with where we stand today. No fevers, chills, nausea, vomiting, or diarrhea. 05/05/2021 upon evaluation today  patient's wound is actually showing signs of significant improvement and overall I am extremely pleased with where we stand today. There does not appear to be any signs of active infection which is great news and overall I am extremely pleased with where we stand at this point. No fevers, chills, nausea, vomiting, or diarrhea. 05/12/2021 upon evaluation today patient appears to be doing okay in regard to her wound is measuring a little bit smaller today that is good news. With that being said she still continues to have significant issues here with an open wound on the lateral aspect of her ankle. Fortunately there does not appear to be any signs of active infection systemically which is great news. No fevers, chills, nausea, vomiting, or diarrhea. 06/13/2021 upon evaluation today patient unfortunately continues to have issues with her right leg. Specifically the ankle region where there is an open wound. Previously the wound on the left leg had completely closed. Nonetheless this left heel has reopened as well. I am going to perform some debridement in regard to the heel ulcer appears to be a fluid-filled blister underneath this area. She has not been here for such a long time due to the fact that she was very sick and they were not able to bring her out. That is the reason we have not seen her in over the past month. 06/20/2021 upon evaluation patient's wounds currently are doing well on the left heel this appears to be healed. There does not appear to be any signs of drainage at this time which is great news. No fevers, chills, nausea, vomiting, or diarrhea. On the right heel there is still an open wound but I feel like we are showing some signs of improvement this is still good to take a bit of time. 06/27/2021 upon evaluation today patient appears to be doing better in regard to her wound. This is actually showing signs of good improvement which is great news. Fortunately I do not see any signs of  infection currently which is great and overall we are finally seeing some actual decrease in the size I think that the compression wraps are making a big difference here. 12/27 some debris on the surface. No evidence of surrounding infection  we have been using collagen 07/21/2021 upon evaluation today patient appears to be doing well with regard to her wound. She has been tolerating the dressing changes without complication. Fortunately I do not see any evidence of active infection locally nor systemically at this time which is great news and overall I think the wound making excellent progress here. Electronic Signature(s) Signed: 07/21/2021 5:11:32 PM By: Worthy Keeler PA-C Entered By: Worthy Keeler on 07/21/2021 17:11:32 Mineo, [name] ([id number]) -------------------------------------------------------------------------------- Physical Exam Details Patient Name: JACQULYNN, SHAPPELL Date of Service: 07/21/2021 3:15 PM Medical Record Number: [id number] Patient Account Number: [id number] Date of Birth/Sex: Aug 25, 1927 (88 y.o. F) Treating RN: Donnamarie Poag Primary Care Provider: Viviana Simpler Other Clinician: Referring Provider: Viviana Simpler Treating Provider/Extender: Skipper Cliche in Treatment: 44 Constitutional Well-nourished and well-hydrated in no acute distress. Respiratory normal breathing without difficulty. Psychiatric this patient is able to make decisions and demonstrates good insight into disease process. Alert and Oriented x 3. pleasant and cooperative. Notes Patient's wound currently showed signs of good granulation and epithelization at this point. I think she is actually making excellent progress and I am very pleased. They have been changing this at home which is nice because it does not get to drive between dressing changes. Electronic Signature(s) Signed: 07/21/2021 5:13:06 PM By: Worthy Keeler PA-C Entered By: Worthy Keeler on 07/21/2021 17:13:06 Stlouis,  [name] ([id number]) -------------------------------------------------------------------------------- Physician Orders Details Patient Name: TYKESHA, KONICKI Date of Service: 07/21/2021 3:15 PM Medical Record Number: [id number] Patient Account Number: [id number] Date of Birth/Sex: 1928-01-26 (88 y.o. F) Treating RN: Donnamarie Poag Primary Care Provider: Viviana Simpler Other Clinician: Referring Provider: Viviana Simpler Treating Provider/Extender: Skipper Cliche in Treatment: 53 Verbal / Phone Orders: No Diagnosis Coding ICD-10 Coding Code Description L89.513 Pressure ulcer of right ankle, stage 3 I73.89 Other specified peripheral vascular diseases L89.623 Pressure ulcer of left heel, stage 3 I10 Essential (primary) hypertension I25.10 Atherosclerotic heart disease of native coronary artery without angina pectoris Follow-up Appointments o Return Appointment in 1 week. o Nurse Visit as needed Bathing/ Shower/ Hygiene o May shower with wound dressing protected with water repellent cover or cast protector. o No tub bath. Anesthetic (Use 'Patient Medications' Section for Anesthetic Order Entry) o Lidocaine applied to wound bed Edema Control - Lymphedema / Segmental Compressive Device / Other o Elevate legs to the level of the heart and pump ankles as often as possible o Elevate leg(s) parallel to the floor when sitting. o DO YOUR BEST to sleep in the bed at night. DO NOT sleep in your recliner. Long hours of sitting in a recliner leads to swelling of the legs and/or potential wounds on your backside. Non-Wound Condition o Additional non-wound orders/instructions: - ABD bilateral to pad shins anytime compression wrap is used Additional Orders / Instructions o Follow Nutritious Diet and Increase Protein Intake Wound Treatment Wound #1 - Malleolus Wound Laterality: Right, Lateral Cleanser: Soap and Water Every Other Day/15 Days Discharge Instructions: Gently  cleanse wound with antibacterial soap, rinse and pat dry prior to dressing wounds Primary Dressing: Prisma 4.34 (in) Every Other Day/15 Days Discharge Instructions: Moisten w/normal saline or KY jelly--or sterile water; Cover wound as directed. Do not remove from wound bed. Secondary Dressing: Zetuvit Plus Silicone Border Dressing 4x4 (in/in) Every Other Day/15 Days Secured With: Tubigrip Size C, 2.75x10 (in/yd) Every Other Day/15 Days Discharge Instructions: Apply 3 Tubigrip C 3-finger-widths below knee to base of toes to secure dressing and/or for swelling. Electronic  Signature(s) Signed: 07/21/2021 4:02:12 PM By: Donnamarie Poag Signed: 07/21/2021 5:59:23 PM By: Worthy Keeler PA-C Entered By: Donnamarie Poag on 07/21/2021 15:40:37 Sitar, [name] ([id number]) -------------------------------------------------------------------------------- Problem List Details Patient Name: ALEX, MCMANIGAL Date of Service: 07/21/2021 3:15 PM Medical Record Number: [id number] Patient Account Number: [id number] Date of Birth/Sex: [birth year] (88 y.o. F) Treating RN: Donnamarie Poag Primary Care Provider: Viviana Simpler Other Clinician: Referring Provider: Viviana Simpler Treating Provider/Extender: Skipper Cliche in Treatment: 17 Active Problems ICD-10 Encounter Code Description Active Date MDM Diagnosis L89.513 Pressure ulcer of right ankle, stage 3 10/21/2020 No Yes I73.89 Other specified peripheral vascular diseases 10/21/2020 No Yes L89.623 Pressure ulcer of left heel, stage 3 10/21/2020 No Yes I10 Essential (primary) hypertension 10/21/2020 No Yes I25.10 Atherosclerotic heart disease of native coronary artery without angina 10/21/2020 No Yes pectoris Inactive Problems Resolved Problems ICD-10 Code Description Active Date Resolved Date S51.802A Unspecified open wound of left forearm, initial encounter 10/21/2020 10/21/2020 L89.312 Pressure ulcer of right buttock, stage 2 01/27/2021 01/27/2021 Electronic  Signature(s) Signed: 07/21/2021 3:37:10 PM By: Worthy Keeler PA-C Entered By: Worthy Keeler on 07/21/2021 15:37:10 Cadman, [name] ([id number]) -------------------------------------------------------------------------------- Progress Note Details Patient Name: [name] Date of Service: 07/21/2021 3:15 PM Medical Record Number: [id number] Patient Account Number: [id number] Date of Birth/Sex: 07-Mar-1928 (88 y.o. F) Treating RN: Donnamarie Poag Primary Care Provider: Viviana Simpler Other Clinician: Referring Provider: Viviana Simpler Treating Provider/Extender: Skipper Cliche in Treatment: 39 Subjective Chief Complaint Information obtained from Patient Pressure ulcer right ankle and left heel History of Present Illness (HPI) 10/21/2020 upon evaluation today patient presents for initial inspection here in our clinic concerning issues that she has been having quite some time in regard to her ankle and since she has been in the hospital with regard to the heel. The ankle in fact has been 5-6 years at least I am told. With that being said the heel ulcer occurred when she was in the hospital in December for hip surgery when she fractured her hip. She also was in the hospital Tuesday for altered mental status. Really there was nothing that was identified as the cause for this. She did have a. Fortunately there does not appear to be any signs of infection she tells me that she has had she believes arterial studies At [facility] clinic I could not find Those studies at this point. Nonetheless I will continue to look and see what I can find before Then. The patient also has a skin tear on her arm this occurred more recently when she bumped this at home. The patient does have a history of hypertension, coronary artery disease, and peripheral vascular disease stated. 10/28/2020 I was able to find the patient's chart currently which shows that she did have an arterial study performed in May 2019.  This showed that she had a abnormal right toe brachial index and a normal left toe brachial index. She was noncompressible as far as ABIs were concerned. She did appear to have triphasic flow at that time. Unfortunately the wound that is commented on in the report that I printed off and read mentions the same wound on the ankle that we are still dealing with at this point. Unfortunately this has not healed. And its been quite sometime about 3 years now. Fortunately there does not appear to be any signs of active infection systemically at this point. I think that the patient has done well with the Santyl over the past week which is  good news. Patient's caregiver which is her daughter-in-law is concerned about the fact that she really does not feel qualified to be able to change the dressings and take care of this issue. There does not appear to be any signs of anything untoward going on at this point. I think she is done a great job applying the [employer] and I think that has done a great job for the patient is for soften up some of the necrotic tissue. With that being said I think the Xeroform on the arm also has done excellent. In general I am very pleased with where we stand. And I told the patient's daughter-in- law as well that I also feel like she has done a great job over the past week taking care of her mother. 11/11/2020 upon evaluation today patient appears to be doing well with regard to her wounds. She has been tolerating the dressing changes without complication her daughters been applying the Santyl which has done a great job. Ho with that being said I think now that we have good arterial study showing we can go ahead and proceed with sharp debridement at this point. 11/25/2020 upon evaluation today patient appears to be doing well with regard to her wounds. The Santyl has really helped to clean things up and overall she is doing quite excellent at this point. Fortunately there does not appear  to be any signs of infection which is great news and in general extremely pleased. I do believe some debridement is in order and hopefully will be able to get her into a collagen dressing after this. 12/23/2020 upon evaluation today patient appears to be doing well with regard to her wound all things considered. Unfortunately it does sound like her daughter decided to let this air out because she felt like it was getting so wet. It sounds like she got border gauze dressings instead of border foam therefore there is really Nothing to catch the excess drainage which I think is the problem they were worried about the smell. Fortunately there does not appear to be any signs of active infection which is great news. Nonetheless I do not think we want to leave this just open to air. 01/13/2021 upon evaluation today patient's wounds actually appear to be about as good as have seen since have been taking care of her. Fortunately there does not appear to be any evidence of infection which is great and overall the biggest issue I see is that of fluid buildup. I think we need to do something to try to help manage this. I think if we can control her edema we get the wounds to heal more effectively. 01/27/2021 upon evaluation today patient appears to be doing well in regard to the wounds on her right lateral malleolus as well as the left heel and the right ischial tuberosity is unfortunately a new area that has arisen since we last saw her. She tells me currently that that is from sitting too much. With that being said this actually appears to be doing the worst of anything so far that I see today. Fortunately there does not appear to be any signs of infection this is at least good news. Compression wrap seem to be doing excellent for her as far as the legs are concerned. 02/03/2021 upon evaluation today patient appears to be doing well with regard to all of her wounds. She has been tolerating the dressing  changes without complication. Fortunately there is no signs of active infection  at this time. No fevers, chills, nausea, vomiting, or diarrhea. 02/10/2021 upon evaluation today patient appears to be doing well with regard to her wounds. With that being said there is some slight evidence of hypergranulation in regard to the right ankle in particular and a little bit in regard to the left heel. I think Hydrofera Blue might be a better option to go to at this point based on what I am seeing especially since both of these wounds in particular seem to be a little bit stalled. I discussed that with the patient and her daughter today. With regard to the hip this is doing great with the collagen I think will get very close to complete closure I recommend we continue with the collagen. 02/17/2021 upon evaluation today patient appears to be doing excellent in regard to her heel ulcers. She has been tolerating the dressing changes without complication and overall I am extremely pleased with where things stand today. There does not appear to be any signs of active infection which is great news. Overall I may think that we are headed in the proper direction based on what I am seeing. 02/24/2021 upon evaluation today patient's wounds actually are showing signs of improvement in regard to her heels both are doing awesome. In [place], [place] ([id number]) regard to her hip location this is actually reopened after being closed last week and to be perfectly honest I am more concerned here about the fact that pressures causing this issue I do not think it has anything to do with her not having the collagen in place again it was completely healed last week there was no reason for the collagen to be there. 03/03/2021 upon evaluation today patient appears to be doing well with regard to her wounds. Everything is showing signs of improvement and doing some much better as far as the overall size of the wound. Fortunately I think  we are headed in the right direction. 03/10/2021 upon evaluation today patient appears to be doing well with regard to her wound. She is tolerating the dressing changes without complication. The left heel pretty much appears to be almost completely healed although I think it is probably can be 1 more week before I can call this 100% slough. The right ankle is significantly improved with that being said its not completely closed. With regard to the right hip this is completely closed. Overall I am very pleased with where things stand I think were getting very close to complete resolution. I discussed all this with the patient and her daughter-in-law today. 03/17/2021 upon evaluation today patient appears to be doing well with regard to her wounds. Fortunately there does not appear to be any signs of active infection which is great news and overall very pleased with where the patient stands today. I am in general thankful that overall she is making great progress here. There is good to be a little bit of debridement here to clear away some of the necrotic debris today on both wound locations. 03/24/2021 upon evaluation today patient appears to be doing excellent in regard to her wound. She has been tolerating the dressing changes without complication. Fortunately there does not appear to be any evidence of infection the left foot is completely healed this is great news. On the right at the ankle region this is still open though I do not think the alginate did quite as well as I was hoping as far as getting this dried out. I think that we  may just want to switch back to the [facility] which has done well up to this point. I was just hopeful this would wrap things up completely for [place]. 03/31/2021 upon evaluation today patient appears to be doing well with regard to her wound. This is on the right side which appears to be doing better than last week I think is definitely measuring smaller and this is great  news. Overall I am very pleased with where we stand today. I do believe the [facility] is doing better for her. 04/14/2021 upon evaluation today patient appears to be doing well with regard to her wound. I am very pleased with the way it stands I think that she is headed in the right direction. Unfortunately she is having issues with the Tubigrip neither she nor her daughter-in-law who is the primary caregiver can get this on. Subsequently that means that they have been having a very difficult time with complying with the necessary things for this left leg. With that being said I am really not sure what to do to help in that regard. We can always try a bigger that is larger size Tubigrip but at the same time that means that she may not get as good compression and it may not keep things under control the only way to know is to try. 04/21/2021 upon evaluation today patient appears to be doing well in regard to her wound. She has been tolerating the dressing changes without complication and in fact the right ankle ulcer is actually showing signs of excellent improvement I am very pleased with where things stand today. No fevers, chills, nausea, vomiting, or diarrhea. 04/28/2021 upon evaluation today patient appears to be doing well with regard to her ankle ulcer. This is actually showing signs of good improvement which is great news and overall very pleased with where we stand today. No fevers, chills, nausea, vomiting, or diarrhea. 05/05/2021 upon evaluation today patient's wound is actually showing signs of significant improvement and overall I am extremely pleased with where we stand today. There does not appear to be any signs of active infection which is great news and overall I am extremely pleased with where we stand at this point. No fevers, chills, nausea, vomiting, or diarrhea. 05/12/2021 upon evaluation today patient appears to be doing okay in regard to her wound is measuring a little bit  smaller today that is good news. With that being said she still continues to have significant issues here with an open wound on the lateral aspect of her ankle. Fortunately there does not appear to be any signs of active infection systemically which is great news. No fevers, chills, nausea, vomiting, or diarrhea. 06/13/2021 upon evaluation today patient unfortunately continues to have issues with her right leg. Specifically the ankle region where there is an open wound. Previously the wound on the left leg had completely closed. Nonetheless this left heel has reopened as well. I am going to perform some debridement in regard to the heel ulcer appears to be a fluid-filled blister underneath this area. She has not been here for such a long time due to the fact that she was very sick and they were not able to bring her out. That is the reason we have not seen her in over the past month. 06/20/2021 upon evaluation patient's wounds currently are doing well on the left heel this appears to be healed. There does not appear to be any signs of drainage at this time which is  great news. No fevers, chills, nausea, vomiting, or diarrhea. On the right heel there is still an open wound but I feel like we are showing some signs of improvement this is still good to take a bit of time. 06/27/2021 upon evaluation today patient appears to be doing better in regard to her wound. This is actually showing signs of good improvement which is great news. Fortunately I do not see any signs of infection currently which is great and overall we are finally seeing some actual decrease in the size I think that the compression wraps are making a big difference here. 12/27 some debris on the surface. No evidence of surrounding infection we have been using collagen 07/21/2021 upon evaluation today patient appears to be doing well with regard to her wound. She has been tolerating the dressing changes without complication. Fortunately I  do not see any evidence of active infection locally nor systemically at this time which is great news and overall I think the wound making excellent progress here. Objective Constitutional [name], [name] ([id number]) Well-nourished and well-hydrated in no acute distress. Vitals Time Taken: 3:17 PM, Height: 66 in, Weight: 76 lbs, BMI: 12.3, Temperature: 98.2 F, Pulse: 74 bpm, Respiratory Rate: 16 breaths/min, Blood Pressure: 175/91 mmHg. Respiratory normal breathing without difficulty. Psychiatric this patient is able to make decisions and demonstrates good insight into disease process. Alert and Oriented x 3. pleasant and cooperative. General Notes: Patient's wound currently showed signs of good granulation and epithelization at this point. I think she is actually making excellent progress and I am very pleased. They have been changing this at home which is nice because it does not get to drive between dressing changes. Integumentary (Hair, Skin) Wound #1 status is Open. Original cause of wound was Gradually Appeared. The date acquired was: 10/22/2014. The wound has been in treatment 39 weeks. The wound is located on the Right,Lateral Malleolus. The wound measures 0.7cm length x 0.8cm width x 0.2cm depth; 0.44cm^2 area and 0.088cm^3 volume. There is Fat Layer (Subcutaneous Tissue) exposed. There is no tunneling or undermining noted. There is a medium amount of serous drainage noted. The wound margin is flat and intact. There is medium (34-66%) red, hyper - granulation within the wound bed. There is a medium (34-66%) amount of necrotic tissue within the wound bed including Adherent Slough. Assessment Active Problems ICD-10 Pressure ulcer of right ankle, stage 3 Other specified peripheral vascular diseases Pressure ulcer of left heel, stage 3 Essential (primary) hypertension Atherosclerotic heart disease of native coronary artery without angina pectoris Plan Follow-up  Appointments: Return Appointment in 1 week. Nurse Visit as needed Bathing/ Shower/ Hygiene: May shower with wound dressing protected with water repellent cover or cast protector. No tub bath. Anesthetic (Use 'Patient Medications' Section for Anesthetic Order Entry): Lidocaine applied to wound bed Edema Control - Lymphedema / Segmental Compressive Device / Other: Elevate legs to the level of the heart and pump ankles as often as possible Elevate leg(s) parallel to the floor when sitting. DO YOUR BEST to sleep in the bed at night. DO NOT sleep in your recliner. Long hours of sitting in a recliner leads to swelling of the legs and/or potential wounds on your backside. Non-Wound Condition: Additional non-wound orders/instructions: - ABD bilateral to pad shins anytime compression wrap is used Additional Orders / Instructions: Follow Nutritious Diet and Increase Protein Intake WOUND #1: - Malleolus Wound Laterality: Right, Lateral Cleanser: Soap and Water Every Other Day/15 Days Discharge Instructions: Gently cleanse wound with  antibacterial soap, rinse and pat dry prior to dressing wounds Primary Dressing: Prisma 4.34 (in) Every Other Day/15 Days Discharge Instructions: Moisten w/normal saline or KY jelly--or sterile water; Cover wound as directed. Do not remove from wound bed. Secondary Dressing: Zetuvit Plus Silicone Border Dressing 4x4 (in/in) Every Other Day/15 Days Secured With: Tubigrip Size C, 2.75x10 (in/yd) Every Other Day/15 Days Discharge Instructions: Apply 3 Tubigrip C 3-finger-widths below knee to base of toes to secure dressing and/or for swelling. 1. I would recommend currently that we go ahead and continue with the wound care measures as before and the patient is in agreement with plan. This includes the use of the silver collagen dressing which I think is doing a good job. [name], [name] ([id number]) 2. I will also continue with the Zetuvit border foam dressing to  cover. 3. Also going to suggest that we continue with the Tubigrip size C which does well for her for compression. We will see patient back for reevaluation in 1 week here in the clinic. If anything worsens or changes patient will contact our office for additional recommendations. Electronic Signature(s) Signed: 07/21/2021 5:24:03 PM By: Worthy Keeler PA-C Entered By: Worthy Keeler on 07/21/2021 17:24:03 Colavito, [name] ([id number]) -------------------------------------------------------------------------------- SuperBill Details Patient Name: [name], [name] Date of Service: 07/21/2021 Medical Record Number: [id number] Patient Account Number: [id number] Date of Birth/Sex: 03/12/1928 (88 y.o. F) Treating RN: Donnamarie Poag Primary Care Provider: Viviana Simpler Other Clinician: Referring Provider: Viviana Simpler Treating Provider/Extender: Skipper Cliche in Treatment: 39 Diagnosis Coding ICD-10 Codes Code Description L89.513 Pressure ulcer of right ankle, stage 3 I73.89 Other specified peripheral vascular diseases L89.623 Pressure ulcer of left heel, stage 3 I10 Essential (primary) hypertension I25.10 Atherosclerotic heart disease of native coronary artery without angina pectoris Facility Procedures CPT4 Code: 37048889 Description: [phone] - WOUND CARE VISIT-LEV 2 EST PT Modifier: Quantity: 1 Physician Procedures CPT4 Code: 0388828 Description: 99213 - WC PHYS LEVEL 3 - EST PT Modifier: Quantity: 1 CPT4 Code: Description: ICD-10 Diagnosis Description L89.513 Pressure ulcer of right ankle, stage 3 I73.89 Other specified peripheral vascular diseases L89.623 Pressure ulcer of left heel, stage 3 I10 Essential (primary) hypertension Modifier: Quantity: Electronic Signature(s) Signed: 07/21/2021 5:24:26 PM By: Worthy Keeler PA-C Previous Signature: 07/21/2021 4:02:12 PM Version By: Donnamarie Poag Entered By: Worthy Keeler on 07/21/2021 17:24:25

## 2021-07-24 DIAGNOSIS — E11621 Type 2 diabetes mellitus with foot ulcer: Secondary | ICD-10-CM | POA: Diagnosis not present

## 2021-07-27 ENCOUNTER — Telehealth: Payer: Self-pay

## 2021-07-27 NOTE — Progress Notes (Addendum)
Chronic Care Management Pharmacy Assistant   Name: [name]  MRN: [id number] DOB: 05/02/1928  Reason for Encounter: CCM (General Adherence)   Recent office visits:  None since last CCM contact  Recent consult visits:  05/20/2021 - [facility] - Patient presented for cough and nasal congestion. No other information . See hospital visit below.  05/08/2021 - [facility] - Ophthalmology - Patient presented for Neurotrophic Keratoconjunctivitis, right eye. No other information.   Wound Care visits:  Patient was seen at [facility] for Pressure ulcer right ankle and left heel and right ischial/buttock pressure ulcer.  19 visits from 02/03/2021 - 07/21/2021   Hospital visits:  Medication Reconciliation was completed by comparing discharge summary, patients EMR and Pharmacy list, and upon discussion with patient.  Admitted to the hospital on 05/20/2021 due to generalized weakness and malaise. Discharge date was 05/21/2021. Discharged from [facility]    Final Diagnoses: Covid-19 Generalized Weakness Hypokalemia  New?Medications Started at [facility] Discharge:?? -started nirmatrelvir/ritonavir EUA (PAXLOVID) 20 x 150 MG & 10 x 100MG  TABS -started potassium chloride SA (KLOR-CON M20) 20 MEQ tablet  Medications that remain the same after Hospital Discharge:??  -All other medications will remain the same.    Medications: Outpatient Encounter Medications as of 07/27/2021  Medication Sig   acyclovir (ZOVIRAX) 400 MG tablet TAKE 1 TABLET BY MOUTH 4 TIMES DAILY   azelastine (ASTELIN) 0.1 % nasal spray Place 2 sprays into both nostrils 2 (two) times daily.   fexofenadine (ALLEGRA) 180 MG tablet Take 180 mg by mouth daily.   fluticasone (FLONASE) 50 MCG/ACT nasal spray PLACE 2 SPRAYS INTO EACH NOSTRIL ONCE DAILY   hydrocortisone 2.5 % cream Apply topically 3 (three) times daily as needed.   LORazepam (ATIVAN) 1 MG tablet TAKE  1/2 TO ONE TABLET BY MOUTH TWICE DAILY AS NEEDED FOR ANXIETY   morphine (MSIR) 15 MG tablet TAKE 1 TABLET BY MOUTH 3 TIMES DAILY AS NEEDED FOR PAIN   Multiple Vitamin (MULTIVITAMIN) tablet Take 1 tablet by mouth once daily   pantoprazole (PROTONIX) 40 MG tablet TAKE 1 TABLET BY MOUTH TWICE A DAY   polyethylene glycol (MIRALAX / GLYCOLAX) packet Take 17 g by mouth at bedtime.   potassium chloride SA (KLOR-CON M20) 20 MEQ tablet Take 1 tablet (20 mEq total) by mouth daily.   RESTASIS 0.05 % ophthalmic emulsion Place 1 drop into both eyes 2 (two) times daily.   silver sulfADIAZINE (SILVADENE) 1 % cream Apply 1 application topically daily. On open area on buttock   topiramate (TOPAMAX) 50 MG tablet Take 1 tablet (50 mg total) by mouth at bedtime.   No facility-administered encounter medications on file as of 07/27/2021.   Contacted [name] on 07/28/2021 for general disease state and medication adherence call. Spoke with patient's daughter-in-law ([name])>   Star Medications: Medication Name/mg Last Fill Days Supply No star rating mediations noted  What concerns do you have about your medications? Patient and her family does not have any concerns.   The patient denies side effects with their medications.   How often do you forget or accidentally miss a dose? Never  Do you use a pillbox? Yes  Are you having any problems getting your medications from your pharmacy? No  Has the cost of your medications been a concern? No  Since last visit with CPP, no interventions have been made.   The patient has had an ED visit since last contact.  The patient denies problems with their health.   Patient denies concerns or questions for [name], PharmD at this time.   Care Gaps: Annual wellness visit in last year? No Most Recent BP reading: 177/59 on 05/21/2021  Upcoming appointments: PCP appointment on 08/17/2021 for Physical CCM appointment on 10/09/2021 at 2:30 with [name].   [name], CPP notified  [name], [place] Clinical Pharmacy Assistant [phone]  I have reviewed the care management and care coordination activities outlined in this encounter and I am certifying that I agree with the content of this note. No further action required.  [name], PharmD Clinical Pharmacist [facility] [phone]

## 2021-07-28 ENCOUNTER — Other Ambulatory Visit: Payer: Self-pay

## 2021-07-28 DIAGNOSIS — L89513 Pressure ulcer of right ankle, stage 3: Secondary | ICD-10-CM | POA: Diagnosis not present

## 2021-07-31 NOTE — Progress Notes (Signed)
[name], [name] ([id number]) Visit Report for 07/28/2021 Arrival Information Details Patient Name: [name], [name] Date of Service: 07/28/2021 3:15 PM Medical Record Number: [id number] Patient Account Number: [id number] Date of Birth/Sex: 1927-12-09 (88 y.o. F) Treating RN: Donnamarie Poag Primary Care Ashliegh Parekh: Viviana Simpler Other Clinician: Referring Zayleigh Stroh: Viviana Simpler Treating Cesiah Westley/Extender: Tito Dine in Treatment: 32 Visit Information History Since Last Visit Added or deleted any medications: No Patient Arrived: [name] Had a fall or experienced change in No Arrival Time: 15:17 activities of daily living that may affect Accompanied By: daughter risk of falls: Transfer Assistance: [employer] Patient Hospitalized since last visit: No Lift Has Dressing in Place as Prescribed: Yes Patient Identification Verified: Yes Pain Present Now: No Secondary Verification Process Completed: Yes Patient Requires Transmission-Based No Precautions: Patient Has Alerts: Yes Patient Alerts: NOT DIABETIC TBI left .77 TBI right .76 Electronic Signature(s) Signed: 07/31/2021 4:33:32 PM By: Donnamarie Poag Entered By: Donnamarie Poag on 07/28/2021 15:22:33 [name] ([id number]) -------------------------------------------------------------------------------- Clinic Level of Care Assessment Details Patient Name: [name], [name] Date of Service: 07/28/2021 3:15 PM Medical Record Number: [id number] Patient Account Number: [id number] Date of Birth/Sex: 1928/06/04 (88 y.o. F) Treating RN: Donnamarie Poag Primary Care Nashton Belson: Viviana Simpler Other Clinician: Referring Azariah Latendresse: Viviana Simpler Treating Kailie Polus/Extender: Tito Dine in Treatment: 40 Clinic Level of Care Assessment Items TOOL 4 Quantity Score []  - Use when only an EandM is performed on FOLLOW-UP visit 0 ASSESSMENTS - Nursing Assessment / Reassessment []  - Reassessment of Co-morbidities (includes updates  in patient status) 0 []  - 0 Reassessment of Adherence to Treatment Plan ASSESSMENTS - Wound and Skin Assessment / Reassessment X - Simple Wound Assessment / Reassessment - one wound 1 5 []  - 0 Complex Wound Assessment / Reassessment - multiple wounds []  - 0 Dermatologic / Skin Assessment (not related to wound area) ASSESSMENTS - Focused Assessment []  - Circumferential Edema Measurements - multi extremities 0 []  - 0 Nutritional Assessment / Counseling / Intervention []  - 0 Lower Extremity Assessment (monofilament, tuning fork, pulses) []  - 0 Peripheral Arterial Disease Assessment (using hand held doppler) ASSESSMENTS - Ostomy and/or Continence Assessment and Care []  - Incontinence Assessment and Management 0 []  - 0 Ostomy Care Assessment and Management (repouching, etc.) PROCESS - Coordination of Care X - Simple Patient / Family Education for ongoing care 1 15 []  - 0 Complex (extensive) Patient / Family Education for ongoing care []  - 0 Staff obtains Programmer, systems, Records, Test Results / Process Orders []  - 0 Staff telephones HHA, Nursing Homes / Clarify orders / etc []  - 0 Routine Transfer to another Facility (non-emergent condition) []  - 0 Routine Hospital Admission (non-emergent condition) []  - 0 New Admissions / Biomedical engineer / Ordering NPWT, Apligraf, etc. []  - 0 Emergency Hospital Admission (emergent condition) X- 1 10 Simple Discharge Coordination []  - 0 Complex (extensive) Discharge Coordination PROCESS - Special Needs []  - Pediatric / Minor Patient Management 0 []  - 0 Isolation Patient Management []  - 0 Hearing / Language / Visual special needs []  - 0 Assessment of Community assistance (transportation, D/C planning, etc.) []  - 0 Additional assistance / Altered mentation []  - 0 Support Surface(s) Assessment (bed, cushion, seat, etc.) INTERVENTIONS - Wound Cleansing / Measurement [name], [name] ([id number]) X- 1 5 Simple Wound Cleansing - one  wound []  - 0 Complex Wound Cleansing - multiple wounds []  - 0 Wound Imaging (photographs - any number of wounds) []  - 0 Wound Tracing (instead of photographs) X- 1  5 Simple Wound Measurement - one wound []  - 0 Complex Wound Measurement - multiple wounds INTERVENTIONS - Wound Dressings X - Small Wound Dressing one or multiple wounds 1 10 []  - 0 Medium Wound Dressing one or multiple wounds []  - 0 Large Wound Dressing one or multiple wounds []  - 0 Application of Medications - topical []  - 0 Application of Medications - injection INTERVENTIONS - Miscellaneous []  - External ear exam 0 []  - 0 Specimen Collection (cultures, biopsies, blood, body fluids, etc.) []  - 0 Specimen(s) / Culture(s) sent or taken to Lab for analysis []  - 0 Patient Transfer (multiple staff / Civil Service fast streamer / Similar devices) []  - 0 Simple Staple / Suture removal (25 or less) []  - 0 Complex Staple / Suture removal (26 or more) []  - 0 Hypo / Hyperglycemic Management (close monitor of Blood Glucose) []  - 0 Ankle / Brachial Index (ABI) - do not check if billed separately []  - 0 Vital Signs Has the patient been seen at the hospital within the last three years: Yes Total Score: 50 Level Of Care: New/Established - Level 2 Electronic Signature(s) Signed: 07/31/2021 4:33:32 PM By: Donnamarie Poag Entered By: Donnamarie Poag on 07/28/2021 15:30:47 [name], [name] ([id number]) -------------------------------------------------------------------------------- Encounter Discharge Information Details Patient Name: [name], [name] Date of Service: 07/28/2021 3:15 PM Medical Record Number: [id number] Patient Account Number: [id number] Date of Birth/Sex: 01/10/1928 (88 y.o. F) Treating RN: Donnamarie Poag Primary Care Arliss Frisina: Viviana Simpler Other Clinician: Referring Tycho Cheramie: Viviana Simpler Treating Delona Clasby/Extender: Tito Dine in Treatment: 61 Encounter Discharge Information Items Discharge Condition:  Stable Ambulatory Status: Walker Discharge Destination: Home Transportation: Private Auto Accompanied By: daughter in law Schedule Follow-up Appointment: Yes Clinical Summary of Care: Electronic Signature(s) Signed: 07/31/2021 4:33:32 PM By: Donnamarie Poag Entered By: Donnamarie Poag on 07/28/2021 15:30:28 [name], [name] ([id number]) -------------------------------------------------------------------------------- Wound Assessment Details Patient Name: [name], [name] Date of Service: 07/28/2021 3:15 PM Medical Record Number: [id number] Patient Account Number: [id number] Date of Birth/Sex: 08/10/1927 (88 y.o. F) Treating RN: Donnamarie Poag Primary Care Nisreen Guise: Viviana Simpler Other Clinician: Referring [name] [name]: Viviana Simpler Treating Madylyn Insco/Extender: Tito Dine in Treatment: 40 Wound Status Wound Number: 1 Primary Pressure Ulcer Etiology: Wound Location: Right, Lateral Malleolus Wound Open Wounding Event: Gradually Appeared Status: Date Acquired: 10/22/2014 Comorbid Arrhythmia, Coronary Artery Disease, Hypertension, Weeks Of Treatment: 40 History: Peripheral Arterial Disease, History of pressure wounds, Clustered Wound: No Osteoarthritis Wound Measurements Length: (cm) 0.7 Width: (cm) 0.8 Depth: (cm) 0.2 Area: (cm) 0.44 Volume: (cm) 0.088 % Reduction in Area: 43.9% % Reduction in Volume: -11.4% Epithelialization: Small (1-33%) Wound Description Classification: Category/Stage III Wound Margin: Flat and Intact Exudate Amount: Medium Exudate Type: Serous Exudate Color: amber Foul Odor After Cleansing: No Slough/Fibrino Yes Wound Bed Granulation Amount: Medium (34-66%) Exposed Structure Granulation Quality: Red, Hyper-granulation Fascia Exposed: No Necrotic Amount: Medium (34-66%) Fat Layer (Subcutaneous Tissue) Exposed: Yes Necrotic Quality: Adherent Slough Tendon Exposed: No Muscle Exposed: No Joint Exposed: No Bone Exposed: No Treatment  Notes Wound #1 (Malleolus) Wound Laterality: Right, Lateral Cleanser Soap and Water Discharge Instruction: Gently cleanse wound with antibacterial soap, rinse and pat dry prior to dressing wounds Peri-Wound Care Topical Primary Dressing Prisma 4.34 (in) Discharge Instruction: Moisten w/normal saline or KY jelly--or sterile water; Cover wound as directed. Do not remove from wound bed. Secondary Dressing Zetuvit Plus Silicone Border Dressing 4x4 (in/in) Secured With Tubigrip Size C, 2.75x10 (in/yd) Discharge Instruction: Apply 3 Tubigrip C 3-finger-widths below knee to base of toes  to secure dressing and/or for swelling. [name], [name] ([id number]) Compression Wrap Compression Stockings Add-Ons Electronic Signature(s) Signed: 07/31/2021 4:33:32 PM By: Donnamarie Poag Entered ByDonnamarie Poag on 07/28/2021 15:22:53

## 2021-07-31 NOTE — Progress Notes (Signed)
KYRIELLE, URBANSKI ([id number]) Visit Report for 07/28/2021 Physician Orders Details Patient Name: [name], [name]. Date of Service: 07/28/2021 3:15 PM Medical Record Number: [id number] Patient Account Number: [id number] Date of Birth/Sex: 02/10/1928 (88 y.o. F) Treating RN: Donnamarie Poag Primary Care Provider: Viviana Simpler Other Clinician: Referring Provider: Viviana Simpler Treating Provider/Extender: Tito Dine in Treatment: 74 Verbal / Phone Orders: No Diagnosis Coding Follow-up Appointments o Return Appointment in 1 week. o Nurse Visit as needed Bathing/ Shower/ Hygiene o May shower with wound dressing protected with water repellent cover or cast protector. o No tub bath. Anesthetic (Use 'Patient Medications' Section for Anesthetic Order Entry) o Lidocaine applied to wound bed Edema Control - Lymphedema / Segmental Compressive Device / Other o Elevate legs to the level of the heart and pump ankles as often as possible o Elevate leg(s) parallel to the floor when sitting. o DO YOUR BEST to sleep in the bed at night. DO NOT sleep in your recliner. Long hours of sitting in a recliner leads to swelling of the legs and/or potential wounds on your backside. Non-Wound Condition o Additional non-wound orders/instructions: - ABD bilateral to pad shins anytime compression wrap is used Additional Orders / Instructions o Follow Nutritious Diet and Increase Protein Intake Wound Treatment Wound #1 - Malleolus Wound Laterality: Right, Lateral Cleanser: Soap and Water Every Other Day/15 Days Discharge Instructions: Gently cleanse wound with antibacterial soap, rinse and pat dry prior to dressing wounds Primary Dressing: Prisma 4.34 (in) Every Other Day/15 Days Discharge Instructions: Moisten w/normal saline or KY jelly--or sterile water; Cover wound as directed. Do not remove from wound bed. Secondary Dressing: Zetuvit Plus Silicone Border Dressing 4x4 (in/in)  Every Other Day/15 Days Secured With: Tubigrip Size C, 2.75x10 (in/yd) Every Other Day/15 Days Discharge Instructions: Apply 3 Tubigrip C 3-finger-widths below knee to base of toes to secure dressing and/or for swelling. Electronic Signature(s) Signed: 07/28/2021 3:34:05 PM By: Linton Ham MD Signed: 07/31/2021 4:33:32 PM By: Donnamarie Poag Entered By: Donnamarie Poag on 07/28/2021 15:23:29 Fredrick, [name] ([id number]) -------------------------------------------------------------------------------- Scott Details Patient Name: [name], [name] Date of Service: 07/28/2021 Medical Record Number: [id number] Patient Account Number: [id number] Date of Birth/Sex: Nov 20, 1927 (88 y.o. F) Treating RN: Donnamarie Poag Primary Care Provider: Viviana Simpler Other Clinician: Referring Provider: Viviana Simpler Treating Provider/Extender: Tito Dine in Treatment: 40 Diagnosis Coding ICD-10 Codes Code Description [phone] Pressure ulcer of right ankle, stage 3 I73.89 Other specified peripheral vascular diseases L89.623 Pressure ulcer of left heel, stage 3 I10 Essential (primary) hypertension I25.10 Atherosclerotic heart disease of native coronary artery without angina pectoris Facility Procedures CPT4 Code: [id number] Description: 24235 - WOUND CARE VISIT-LEV 2 EST PT Modifier: Quantity: 1 Electronic Signature(s) Signed: 07/28/2021 3:34:05 PM By: Linton Ham MD Signed: 07/31/2021 4:33:32 PM By: Donnamarie Poag Entered ByDonnamarie Poag on 07/28/2021 15:31:44

## 2021-08-01 ENCOUNTER — Telehealth: Payer: Self-pay | Admitting: Internal Medicine

## 2021-08-01 NOTE — Telephone Encounter (Signed)
[name] with [facility] called stating that pt son would like a new walker that's more narrow and has bigger wheels for pt. Please advise.

## 2021-08-02 NOTE — Telephone Encounter (Signed)
Spoke to [place]. She said the pt's son stated pt has a rolling walker with 2 front wheels and no wheels on the back. The one she has is wide and she keeps running in to walls. Needs rx for a more narrow rolling walker.

## 2021-08-02 NOTE — Telephone Encounter (Signed)
Left message to see what pt would like for [place] to do with the RX.

## 2021-08-03 ENCOUNTER — Ambulatory Visit: Payer: PPO | Admitting: Dermatology

## 2021-08-04 ENCOUNTER — Other Ambulatory Visit: Payer: Self-pay

## 2021-08-04 ENCOUNTER — Encounter: Payer: PPO | Admitting: Physician Assistant

## 2021-08-04 DIAGNOSIS — I10 Essential (primary) hypertension: Secondary | ICD-10-CM | POA: Diagnosis not present

## 2021-08-04 DIAGNOSIS — I7389 Other specified peripheral vascular diseases: Secondary | ICD-10-CM | POA: Diagnosis not present

## 2021-08-04 DIAGNOSIS — L89623 Pressure ulcer of left heel, stage 3: Secondary | ICD-10-CM | POA: Diagnosis not present

## 2021-08-04 DIAGNOSIS — L89513 Pressure ulcer of right ankle, stage 3: Secondary | ICD-10-CM | POA: Diagnosis not present

## 2021-08-04 NOTE — Progress Notes (Addendum)
[name], [name] ([id number]) Visit Report for 08/04/2021 Arrival Information Details Patient Name: [name], [name] Date of Service: 08/04/2021 3:15 PM Medical Record Number: [id number] Patient Account Number: [id number] Date of Birth/Sex: [birth year] (88 y.o. F) Treating RN: Donnamarie Poag Primary Care Donnie Gedeon: Viviana Simpler Other Clinician: Referring Avraham Benish: Viviana Simpler Treating Khrystyna Schwalm/Extender: Skipper Cliche in Treatment: 45 Visit Information History Since Last Visit Added or deleted any medications: No Patient Arrived: [name] Had a fall or experienced change in No Arrival Time: 15:30 activities of daily living that may affect Accompanied By: daughter risk of falls: Transfer Assistance: None Hospitalized since last visit: No Patient Identification Verified: Yes Has Dressing in Place as Prescribed: Yes Secondary Verification Process Completed: Yes Pain Present Now: No Patient Requires Transmission-Based Precautions: No Patient Has Alerts: Yes Patient Alerts: NOT DIABETIC TBI left .77 TBI right .76 Electronic Signature(s) Signed: 08/04/2021 4:05:14 PM By: Donnamarie Poag Entered By: Donnamarie Poag on 08/04/2021 15:31:54 Gongora, [name] ([id number]) -------------------------------------------------------------------------------- Clinic Level of Care Assessment Details Patient Name: [name] Date of Service: 08/04/2021 3:15 PM Medical Record Number: [id number] Patient Account Number: [id number] Date of Birth/Sex: September 17, 1927 (88 y.o. F) Treating RN: Donnamarie Poag Primary Care Marquis Diles: Viviana Simpler Other Clinician: Referring Nialah Saravia: Viviana Simpler Treating Per Beagley/Extender: Skipper Cliche in Treatment: 65 Clinic Level of Care Assessment Items TOOL 4 Quantity Score []  - Use when only an EandM is performed on FOLLOW-UP visit 0 ASSESSMENTS - Nursing Assessment / Reassessment []  - Reassessment of Co-morbidities (includes updates in patient status) 0 []  -  0 Reassessment of Adherence to Treatment Plan ASSESSMENTS - Wound and Skin Assessment / Reassessment X - Simple Wound Assessment / Reassessment - one wound 1 5 []  - 0 Complex Wound Assessment / Reassessment - multiple wounds []  - 0 Dermatologic / Skin Assessment (not related to wound area) ASSESSMENTS - Focused Assessment []  - Circumferential Edema Measurements - multi extremities 0 []  - 0 Nutritional Assessment / Counseling / Intervention []  - 0 Lower Extremity Assessment (monofilament, tuning fork, pulses) []  - 0 Peripheral Arterial Disease Assessment (using hand held doppler) ASSESSMENTS - Ostomy and/or Continence Assessment and Care []  - Incontinence Assessment and Management 0 []  - 0 Ostomy Care Assessment and Management (repouching, etc.) PROCESS - Coordination of Care X - Simple Patient / Family Education for ongoing care 1 15 []  - 0 Complex (extensive) Patient / Family Education for ongoing care []  - 0 Staff obtains Programmer, systems, Records, Test Results / Process Orders []  - 0 Staff telephones HHA, Nursing Homes / Clarify orders / etc []  - 0 Routine Transfer to another Facility (non-emergent condition) []  - 0 Routine Hospital Admission (non-emergent condition) []  - 0 New Admissions / Biomedical engineer / Ordering NPWT, Apligraf, etc. []  - 0 Emergency Hospital Admission (emergent condition) X- 1 10 Simple Discharge Coordination []  - 0 Complex (extensive) Discharge Coordination PROCESS - Special Needs []  - Pediatric / Minor Patient Management 0 []  - 0 Isolation Patient Management []  - 0 Hearing / Language / Visual special needs []  - 0 Assessment of Community assistance (transportation, D/C planning, etc.) []  - 0 Additional assistance / Altered mentation []  - 0 Support Surface(s) Assessment (bed, cushion, seat, etc.) INTERVENTIONS - Wound Cleansing / Measurement LAYKIN, RAINONE ([id number]) X- 1 5 Simple Wound Cleansing - one wound []  - 0 Complex Wound  Cleansing - multiple wounds X- 1 5 Wound Imaging (photographs - any number of wounds) []  - 0 Wound Tracing (instead of photographs) X- 1 5 Simple Wound Measurement -  one wound []  - 0 Complex Wound Measurement - multiple wounds INTERVENTIONS - Wound Dressings X - Small Wound Dressing one or multiple wounds 1 10 []  - 0 Medium Wound Dressing one or multiple wounds []  - 0 Large Wound Dressing one or multiple wounds X- 1 5 Application of Medications - topical []  - 0 Application of Medications - injection INTERVENTIONS - Miscellaneous []  - External ear exam 0 []  - 0 Specimen Collection (cultures, biopsies, blood, body fluids, etc.) []  - 0 Specimen(s) / Culture(s) sent or taken to Lab for analysis []  - 0 Patient Transfer (multiple staff / Civil Service fast streamer / Similar devices) []  - 0 Simple Staple / Suture removal (25 or less) []  - 0 Complex Staple / Suture removal (26 or more) []  - 0 Hypo / Hyperglycemic Management (close monitor of Blood Glucose) []  - 0 Ankle / Brachial Index (ABI) - do not check if billed separately X- 1 5 Vital Signs Has the patient been seen at the hospital within the last three years: Yes Total Score: 65 Level Of Care: New/Established - Level 2 Electronic Signature(s) Signed: 08/04/2021 4:30:15 PM By: Donnamarie Poag Entered By: Donnamarie Poag on 08/04/2021 16:18:09 Yu, [name] ([id number]) -------------------------------------------------------------------------------- Encounter Discharge Information Details Patient Name: [name] Date of Service: 08/04/2021 3:15 PM Medical Record Number: [id number] Patient Account Number: [id number] Date of Birth/Sex: 17-May-1928 (88 y.o. F) Treating RN: Donnamarie Poag Primary Care Annalia Metzger: Viviana Simpler Other Clinician: Referring Rolin Schult: Viviana Simpler Treating Lamya Lausch/Extender: Skipper Cliche in Treatment: 73 Encounter Discharge Information Items Discharge Condition: Stable Ambulatory Status:  Walker Discharge Destination: Home Transportation: Private Auto Accompanied By: daughter in law Schedule Follow-up Appointment: Yes Clinical Summary of Care: Electronic Signature(s) Signed: 08/04/2021 4:30:15 PM By: Donnamarie Poag Entered By: Donnamarie Poag on 08/04/2021 16:19:48 Tatlock, [name] ([id number]) -------------------------------------------------------------------------------- Lower Extremity Assessment Details Patient Name: OLIA, HINDERLITER Date of Service: 08/04/2021 3:15 PM Medical Record Number: [id number] Patient Account Number: [id number] Date of Birth/Sex: 1927/07/23 (88 y.o. F) Treating RN: Donnamarie Poag Primary Care Kwanza Cancelliere: Viviana Simpler Other Clinician: Referring Rylend Pietrzak: Viviana Simpler Treating Devrin Monforte/Extender: Skipper Cliche in Treatment: 41 Edema Assessment Assessed: [Left: No] Patrice Paradise: Yes] [Left: Edema] [Right: :] Calf Left: Right: Point of Measurement: 33 cm From Medial Instep 26 cm Ankle Left: Right: Point of Measurement: 9 cm From Medial Instep 19.5 cm Electronic Signature(s) Signed: 08/04/2021 4:05:14 PM By: Donnamarie Poag Entered By: Donnamarie Poag on 08/04/2021 15:41:31 Roat, [name] ([id number]) -------------------------------------------------------------------------------- Multi Wound Chart Details Patient Name: [name] Date of Service: 08/04/2021 3:15 PM Medical Record Number: [id number] Patient Account Number: [id number] Date of Birth/Sex: March 09, 1928 (88 y.o. F) Treating RN: Donnamarie Poag Primary Care Daphanie Oquendo: Viviana Simpler Other Clinician: Referring Clydia Nieves: Viviana Simpler Treating Jaquel Glassburn/Extender: Skipper Cliche in Treatment: 65 Vital Signs Height(in): 66 Pulse(bpm): 29 Weight(lbs): 3 Blood Pressure(mmHg): 158/82 Body Mass Index(BMI): 12.3 Temperature(F): 98.2 Respiratory Rate(breaths/min): 16 Photos: [N/A:N/A] Wound Location: Right, Lateral Malleolus N/A N/A Wounding Event: Gradually Appeared N/A N/A Primary  Etiology: Pressure Ulcer N/A N/A Comorbid History: Arrhythmia, Coronary Artery N/A N/A Disease, Hypertension, Peripheral Arterial Disease, History of pressure wounds, Osteoarthritis Date Acquired: 10/22/2014 N/A N/A Weeks of Treatment: 41 N/A N/A Wound Status: Open N/A N/A Wound Recurrence: No N/A N/A Measurements L x W x D (cm) 0.9x0.8x0.2 N/A N/A Area (cm) : 0.565 N/A N/A Volume (cm) : 0.113 N/A N/A % Reduction in Area: 28.00% N/A N/A % Reduction in Volume: -43.00% N/A N/A Classification: Category/Stage III N/A N/A Exudate Amount: Large N/A  N/A Exudate Type: Serosanguineous N/A N/A Exudate Color: red, brown N/A N/A Wound Margin: Flat and Intact N/A N/A Granulation Amount: Medium (34-66%) N/A N/A Granulation Quality: Red, Hyper-granulation N/A N/A Necrotic Amount: Medium (34-66%) N/A N/A Exposed Structures: Fat Layer (Subcutaneous Tissue): N/A N/A Yes Fascia: No Tendon: No Muscle: No Joint: No Bone: No Epithelialization: None N/A N/A Treatment Notes Electronic Signature(s) Signed: 08/04/2021 4:05:14 PM By: Donnamarie Poag Entered By: Donnamarie Poag on 08/04/2021 15:42:17 Opfer, [name] ([id number]) [name], [name] ([id number]) -------------------------------------------------------------------------------- [place] Details Patient Name: ROSALEE, TOLLEY Date of Service: 08/04/2021 3:15 PM Medical Record Number: [id number] Patient Account Number: [id number] Date of Birth/Sex: 1928-02-09 (88 y.o. F) Treating RN: Donnamarie Poag Primary Care Reagen Goates: Viviana Simpler Other Clinician: Referring Zaydon Kinser: Viviana Simpler Treating Lovinia Snare/Extender: Skipper Cliche in Treatment: 11 Active Inactive Electronic Signature(s) Signed: 08/04/2021 4:05:14 PM By: Donnamarie Poag Entered By: Donnamarie Poag on 08/04/2021 15:41:38 Ohanian, [name] ([id number]) -------------------------------------------------------------------------------- Pain Assessment Details Patient Name:  JOSIEPHINE, SIMAO Date of Service: 08/04/2021 3:15 PM Medical Record Number: [id number] Patient Account Number: [id number] Date of Birth/Sex: [birth year] (88 y.o. F) Treating RN: Donnamarie Poag Primary Care Ahnyla Mendel: Viviana Simpler Other Clinician: Referring Fergie Sherbert: Viviana Simpler Treating Mairi Stagliano/Extender: Skipper Cliche in Treatment: 2 Active Problems Location of Pain Severity and Description of Pain Patient Has Paino No Site Locations Rate the pain. Current Pain Level: 0 Pain Management and Medication Current Pain Management: Electronic Signature(s) Signed: 08/04/2021 4:05:14 PM By: Donnamarie Poag Entered By: Donnamarie Poag on 08/04/2021 15:34:10 Venuto, [name] ([id number]) -------------------------------------------------------------------------------- Patient/Caregiver Education Details Patient Name: [name] Date of Service: 08/04/2021 3:15 PM Medical Record Number: [id number] Patient Account Number: [id number] Date of Birth/Gender: [birth year] (88 y.o. F) Treating RN: Donnamarie Poag Primary Care Physician: Viviana Simpler Other Clinician: Referring Physician: Viviana Simpler Treating Physician/Extender: Skipper Cliche in Treatment: 51 Education Assessment Education Provided To: Patient Education Topics Provided Basic Hygiene: Venous: Wound/Skin Impairment: Engineer, maintenance) Signed: 08/04/2021 4:30:15 PM By: Donnamarie Poag Entered By: Donnamarie Poag on 08/04/2021 16:18:28 Leduc, [name] ([id number]) -------------------------------------------------------------------------------- Wound Assessment Details Patient Name: JAKERRIA, KINGBIRD Date of Service: 08/04/2021 3:15 PM Medical Record Number: [id number] Patient Account Number: [id number] Date of Birth/Sex: [birth year] (88 y.o. F) Treating RN: Donnamarie Poag Primary Care Natlie Asfour: Viviana Simpler Other Clinician: Referring Augustine Leverette: Viviana Simpler Treating Loudon Krakow/Extender: Skipper Cliche in Treatment: 41 Wound  Status Wound Number: 1 Primary Pressure Ulcer Etiology: Wound Location: Right, Lateral Malleolus Wound Open Wounding Event: Gradually Appeared Status: Date Acquired: 10/22/2014 Comorbid Arrhythmia, Coronary Artery Disease, Hypertension, Weeks Of Treatment: 41 History: Peripheral Arterial Disease, History of pressure wounds, Clustered Wound: No Osteoarthritis Photos Wound Measurements Length: (cm) 0.9 Width: (cm) 0.8 Depth: (cm) 0.2 Area: (cm) 0.565 Volume: (cm) 0.113 % Reduction in Area: 28% % Reduction in Volume: -43% Epithelialization: None Tunneling: No Undermining: No Wound Description Classification: Category/Stage III Wound Margin: Flat and Intact Exudate Amount: Large Exudate Type: Serosanguineous Exudate Color: red, brown Foul Odor After Cleansing: No Slough/Fibrino Yes Wound Bed Granulation Amount: Medium (34-66%) Exposed Structure Granulation Quality: Red, Hyper-granulation Fascia Exposed: No Necrotic Amount: Medium (34-66%) Fat Layer (Subcutaneous Tissue) Exposed: Yes Necrotic Quality: Adherent Slough Tendon Exposed: No Muscle Exposed: No Joint Exposed: No Bone Exposed: No Treatment Notes Wound #1 (Malleolus) Wound Laterality: Right, Lateral Cleanser Soap and Water Discharge Instruction: Gently cleanse wound with antibacterial soap, rinse and pat dry prior to dressing wounds [place], Greeno [name] ([id number]) Peri-Wound Care Topical Primary Dressing Silvercel Small 2x2 (in/in) Discharge Instruction: Apply  Silvercel Small 2x2 (in/in) as instructed Secondary Dressing Gauze Discharge Instruction: small dry gauze under BF Zetuvit Plus Silicone Border Dressing 4x4 (in/in) Secured With Tubigrip Size C, 2.75x10 (in/yd) Discharge Instruction: Apply 3 Tubigrip C 3-finger-widths below knee to base of toes to secure dressing and/or for swelling. Compression Wrap Compression Stockings Add-Ons Electronic Signature(s) Signed: 08/04/2021 4:05:14 PM By: Donnamarie Poag Entered By: Donnamarie Poag on 08/04/2021 15:40:14 Midgley, [name] ([id number]) -------------------------------------------------------------------------------- Vitals Details Patient Name: [name] Date of Service: 08/04/2021 3:15 PM Medical Record Number: [id number] Patient Account Number: [id number] Date of Birth/Sex: 1928-06-21 (88 y.o. F) Treating RN: Donnamarie Poag Primary Care Russell Engelstad: Viviana Simpler Other Clinician: Referring Jacqulyn Barresi: Viviana Simpler Treating Sibyl Mikula/Extender: Skipper Cliche in Treatment: 56 Vital Signs Time Taken: 15:32 Temperature (F): 98.2 Height (in): 66 Pulse (bpm): 64 Weight (lbs): 76 Respiratory Rate (breaths/min): 16 Body Mass Index (BMI): 12.3 Blood Pressure (mmHg): 158/82 Reference Range: 80 - 120 mg / dl Electronic Signature(s) Signed: 08/04/2021 4:05:14 PM By: Donnamarie Poag Entered ByDonnamarie Poag on 08/04/2021 15:33:49

## 2021-08-04 NOTE — Telephone Encounter (Signed)
Spoke to [employer]. She will come get the script. It is up front for pickup.

## 2021-08-04 NOTE — Progress Notes (Addendum)
[name] ([id number]) Visit Report for 08/04/2021 Chief Complaint Document Details Patient Name: [name], [name]. Date of Service: 08/04/2021 3:15 PM Medical Record Number: [id number] Patient Account Number: [id number] Date of Birth/Sex: 1928-03-02 (88 y.o. F) Treating RN: Donnamarie Poag Primary Care Provider: Viviana Simpler Other Clinician: Referring Provider: Viviana Simpler Treating Provider/Extender: Skipper Cliche in Treatment: 18 Information Obtained from: Patient Chief Complaint Pressure ulcer right ankle and left heel Electronic Signature(s) Signed: 08/04/2021 2:43:41 PM By: Worthy Keeler PA-C Entered By: Worthy Keeler on 08/04/2021 14:43:41 [name], [name] ([id number]) -------------------------------------------------------------------------------- HPI Details Patient Name: [name], [name] Date of Service: 08/04/2021 3:15 PM Medical Record Number: [id number] Patient Account Number: [id number] Date of Birth/Sex: 07-17-1927 (88 y.o. F) Treating RN: Donnamarie Poag Primary Care Provider: Viviana Simpler Other Clinician: Referring Provider: Viviana Simpler Treating Provider/Extender: Skipper Cliche in Treatment: 57 History of Present Illness HPI Description: 10/21/2020 upon evaluation today patient presents for initial inspection here in our clinic concerning issues that she has been having quite some time in regard to her ankle and since she has been in the hospital with regard to the heel. The ankle in fact has been 5-6 years at least I am told. With that being said the heel ulcer occurred when she was in the hospital in December for hip surgery when she fractured her hip. She also was in the hospital Tuesday for altered mental status. Really there was nothing that was identified as the cause for this. She did have a. Fortunately there does not appear to be any signs of infection she tells me that she has had she believes arterial studies At [facility] clinic I could not  find Those studies at this point. Nonetheless I will continue to look and see what I can find before Then. The patient also has a skin tear on her arm this occurred more recently when she bumped this at home. The patient does have a history of hypertension, coronary artery disease, and peripheral vascular disease stated. 10/28/2020 I was able to find the patient's chart currently which shows that she did have an arterial study performed in May 2019. This showed that she had a abnormal right toe brachial index and a normal left toe brachial index. She was noncompressible as far as ABIs were concerned. She did appear to have triphasic flow at that time. Unfortunately the wound that is commented on in the report that I printed off and read mentions the same wound on the ankle that we are still dealing with at this point. Unfortunately this has not healed. And its been quite sometime about 3 years now. Fortunately there does not appear to be any signs of active infection systemically at this point. I think that the patient has done well with the Santyl over the past week which is good news. Patient's caregiver which is her daughter-in-law is concerned about the fact that she really does not feel qualified to be able to change the dressings and take care of this issue. There does not appear to be any signs of anything untoward going on at this point. I think she is done a great job applying the [employer] and I think that has done a great job for the patient is for soften up some of the necrotic tissue. With that being said I think the Xeroform on the arm also has done excellent. In general I am very pleased with where we stand. And I told the patient's daughter-in- law as well that  I also feel like she has done a great job over the past week taking care of her mother. 11/11/2020 upon evaluation today patient appears to be doing well with regard to her wounds. She has been tolerating the dressing changes without  complication her daughters been applying the Santyl which has done a great job. Ho with that being said I think now that we have good arterial study showing we can go ahead and proceed with sharp debridement at this point. 11/25/2020 upon evaluation today patient appears to be doing well with regard to her wounds. The Santyl has really helped to clean things up and overall she is doing quite excellent at this point. Fortunately there does not appear to be any signs of infection which is great news and in general extremely pleased. I do believe some debridement is in order and hopefully will be able to get her into a collagen dressing after this. 12/23/2020 upon evaluation today patient appears to be doing well with regard to her wound all things considered. Unfortunately it does sound like her daughter decided to let this air out because she felt like it was getting so wet. It sounds like she got border gauze dressings instead of border foam therefore there is really Nothing to catch the excess drainage which I think is the problem they were worried about the smell. Fortunately there does not appear to be any signs of active infection which is great news. Nonetheless I do not think we want to leave this just open to air. 01/13/2021 upon evaluation today patient's wounds actually appear to be about as good as have seen since have been taking care of her. Fortunately there does not appear to be any evidence of infection which is great and overall the biggest issue I see is that of fluid buildup. I think we need to do something to try to help manage this. I think if we can control her edema we get the wounds to heal more effectively. 01/27/2021 upon evaluation today patient appears to be doing well in regard to the wounds on her right lateral malleolus as well as the left heel and the right ischial tuberosity is unfortunately a new area that has arisen since we last saw her. She tells me currently that that is  from sitting too much. With that being said this actually appears to be doing the worst of anything so far that I see today. Fortunately there does not appear to be any signs of infection this is at least good news. Compression wrap seem to be doing excellent for her as far as the legs are concerned. 02/03/2021 upon evaluation today patient appears to be doing well with regard to all of her wounds. She has been tolerating the dressing changes without complication. Fortunately there is no signs of active infection at this time. No fevers, chills, nausea, vomiting, or diarrhea. 02/10/2021 upon evaluation today patient appears to be doing well with regard to her wounds. With that being said there is some slight evidence of hypergranulation in regard to the right ankle in particular and a little bit in regard to the left heel. I think Hydrofera Blue might be a better option to go to at this point based on what I am seeing especially since both of these wounds in particular seem to be a little bit stalled. I discussed that with the patient and her daughter today. With regard to the hip this is doing great with the collagen I think will get  very close to complete closure I recommend we continue with the collagen. 02/17/2021 upon evaluation today patient appears to be doing excellent in regard to her heel ulcers. She has been tolerating the dressing changes without complication and overall I am extremely pleased with where things stand today. There does not appear to be any signs of active infection which is great news. Overall I may think that we are headed in the proper direction based on what I am seeing. 02/24/2021 upon evaluation today patient's wounds actually are showing signs of improvement in regard to her heels both are doing awesome. In regard to her hip location this is actually reopened after being closed last week and to be perfectly honest I am more concerned here about the fact that pressures  causing this issue I do not think it has anything to do with her not having the collagen in place again it was completely healed last week there was no reason for the collagen to be there. 03/03/2021 upon evaluation today patient appears to be doing well with regard to her wounds. Everything is showing signs of improvement and [name], [name]. ([id number]) doing some much better as far as the overall size of the wound. Fortunately I think we are headed in the right direction. 03/10/2021 upon evaluation today patient appears to be doing well with regard to her wound. She is tolerating the dressing changes without complication. The left heel pretty much appears to be almost completely healed although I think it is probably can be 1 more week before I can call this 100% slough. The right ankle is significantly improved with that being said its not completely closed. With regard to the right hip this is completely closed. Overall I am very pleased with where things stand I think were getting very close to complete resolution. I discussed all this with the patient and her daughter-in-law today. 03/17/2021 upon evaluation today patient appears to be doing well with regard to her wounds. Fortunately there does not appear to be any signs of active infection which is great news and overall very pleased with where the patient stands today. I am in general thankful that overall she is making great progress here. There is good to be a little bit of debridement here to clear away some of the necrotic debris today on both wound locations. 03/24/2021 upon evaluation today patient appears to be doing excellent in regard to her wound. She has been tolerating the dressing changes without complication. Fortunately there does not appear to be any evidence of infection the left foot is completely healed this is great news. On the right at the ankle region this is still open though I do not think the alginate did quite as well  as I was hoping as far as getting this dried out. I think that we may just want to switch back to the [facility] which has done well up to this point. I was just hopeful this would wrap things up completely for [place]. 03/31/2021 upon evaluation today patient appears to be doing well with regard to her wound. This is on the right side which appears to be doing better than last week I think is definitely measuring smaller and this is great news. Overall I am very pleased with where we stand today. I do believe the [facility] is doing better for her. 04/14/2021 upon evaluation today patient appears to be doing well with regard to her wound. I am very pleased with the way it  stands I think that she is headed in the right direction. Unfortunately she is having issues with the Tubigrip neither she nor her daughter-in-law who is the primary caregiver can get this on. Subsequently that means that they have been having a very difficult time with complying with the necessary things for this left leg. With that being said I am really not sure what to do to help in that regard. We can always try a bigger that is larger size Tubigrip but at the same time that means that she may not get as good compression and it may not keep things under control the only way to know is to try. 04/21/2021 upon evaluation today patient appears to be doing well in regard to her wound. She has been tolerating the dressing changes without complication and in fact the right ankle ulcer is actually showing signs of excellent improvement I am very pleased with where things stand today. No fevers, chills, nausea, vomiting, or diarrhea. 04/28/2021 upon evaluation today patient appears to be doing well with regard to her ankle ulcer. This is actually showing signs of good improvement which is great news and overall very pleased with where we stand today. No fevers, chills, nausea, vomiting, or diarrhea. 05/05/2021 upon evaluation today  patient's wound is actually showing signs of significant improvement and overall I am extremely pleased with where we stand today. There does not appear to be any signs of active infection which is great news and overall I am extremely pleased with where we stand at this point. No fevers, chills, nausea, vomiting, or diarrhea. 05/12/2021 upon evaluation today patient appears to be doing okay in regard to her wound is measuring a little bit smaller today that is good news. With that being said she still continues to have significant issues here with an open wound on the lateral aspect of her ankle. Fortunately there does not appear to be any signs of active infection systemically which is great news. No fevers, chills, nausea, vomiting, or diarrhea. 06/13/2021 upon evaluation today patient unfortunately continues to have issues with her right leg. Specifically the ankle region where there is an open wound. Previously the wound on the left leg had completely closed. Nonetheless this left heel has reopened as well. I am going to perform some debridement in regard to the heel ulcer appears to be a fluid-filled blister underneath this area. She has not been here for such a long time due to the fact that she was very sick and they were not able to bring her out. That is the reason we have not seen her in over the past month. 06/20/2021 upon evaluation patient's wounds currently are doing well on the left heel this appears to be healed. There does not appear to be any signs of drainage at this time which is great news. No fevers, chills, nausea, vomiting, or diarrhea. On the right heel there is still an open wound but I feel like we are showing some signs of improvement this is still good to take a bit of time. 06/27/2021 upon evaluation today patient appears to be doing better in regard to her wound. This is actually showing signs of good improvement which is great news. Fortunately I do not see any signs of  infection currently which is great and overall we are finally seeing some actual decrease in the size I think that the compression wraps are making a big difference here. 12/27 some debris on the surface. No evidence of surrounding infection  we have been using collagen 07/21/2021 upon evaluation today patient appears to be doing well with regard to her wound. She has been tolerating the dressing changes without complication. Fortunately I do not see any evidence of active infection locally nor systemically at this time which is great news and overall I think the wound making excellent progress here. 08/04/2021 upon evaluation today patient's wound is showing signs of being a little bit moist and really not significantly smaller. Fortunately I do not see any signs of active infection locally nor systemically at this time that is good news. Nonetheless I do feel like she is doing quite well otherwise and her left leg is still doing awesome. Electronic Signature(s) Signed: 08/04/2021 4:26:23 PM By: Worthy Keeler PA-C Entered By: Worthy Keeler on 08/04/2021 16:26:23 [name] ([id number]) -------------------------------------------------------------------------------- Physical Exam Details Patient Name: [name], [name] Date of Service: 08/04/2021 3:15 PM Medical Record Number: [id number] Patient Account Number: [id number] Date of Birth/Sex: June 08, 1928 (88 y.o. F) Treating RN: Donnamarie Poag Primary Care Provider: Viviana Simpler Other Clinician: Referring Provider: Viviana Simpler Treating Provider/Extender: Skipper Cliche in Treatment: 59 Constitutional Well-nourished and well-hydrated in no acute distress. Respiratory normal breathing without difficulty. Psychiatric this patient is able to make decisions and demonstrates good insight into disease process. Alert and Oriented x 3. pleasant and cooperative. Notes Upon inspection patient's wound did not show any signs of significant  slough buildup so that is good news. Nonetheless I do believe that she is having quite a bit of drainage and I think maybe switching back to silver alginate dressing may be best. Electronic Signature(s) Signed: 08/04/2021 4:27:04 PM By: Worthy Keeler PA-C Entered By: Worthy Keeler on 08/04/2021 16:27:04 [name] ([id number]) -------------------------------------------------------------------------------- Physician Orders Details Patient Name: [name], [name] Date of Service: 08/04/2021 3:15 PM Medical Record Number: [id number] Patient Account Number: [id number] Date of Birth/Sex: August 02, 1927 (88 y.o. F) Treating RN: Donnamarie Poag Primary Care Provider: Viviana Simpler Other Clinician: Referring Provider: Viviana Simpler Treating Provider/Extender: Skipper Cliche in Treatment: 52 Verbal / Phone Orders: No Diagnosis Coding ICD-10 Coding Code Description L89.513 Pressure ulcer of right ankle, stage 3 I73.89 Other specified peripheral vascular diseases L89.623 Pressure ulcer of left heel, stage 3 I10 Essential (primary) hypertension I25.10 Atherosclerotic heart disease of native coronary artery without angina pectoris Follow-up Appointments o Return Appointment in 1 week. o Nurse Visit as needed Bathing/ Shower/ Hygiene o May shower with wound dressing protected with water repellent cover or cast protector. o No tub bath. Anesthetic (Use 'Patient Medications' Section for Anesthetic Order Entry) o Lidocaine applied to wound bed Edema Control - Lymphedema / Segmental Compressive Device / Other o Elevate legs to the level of the heart and pump ankles as often as possible o Elevate leg(s) parallel to the floor when sitting. o DO YOUR BEST to sleep in the bed at night. DO NOT sleep in your recliner. Long hours of sitting in a recliner leads to swelling of the legs and/or potential wounds on your backside. Non-Wound Condition o Additional non-wound  orders/instructions: - ABD bilateral to pad shins anytime compression wrap is used Additional Orders / Instructions o Follow Nutritious Diet and Increase Protein Intake Wound Treatment Wound #1 - Malleolus Wound Laterality: Right, Lateral Cleanser: Soap and Water Every Other Day/15 Days Discharge Instructions: Gently cleanse wound with antibacterial soap, rinse and pat dry prior to dressing wounds Primary Dressing: Silvercel Small 2x2 (in/in) Every Other Day/15 Days Discharge Instructions: Apply Silvercel Small 2x2 (  in/in) as instructed Secondary Dressing: Gauze Every Other Day/15 Days Discharge Instructions: small dry gauze under BF Secondary Dressing: Zetuvit Plus Silicone Border Dressing 4x4 (in/in) Every Other Day/15 Days Secured With: Tubigrip Size C, 2.75x10 (in/yd) Every Other Day/15 Days Discharge Instructions: Apply 3 Tubigrip C 3-finger-widths below knee to base of toes to secure dressing and/or for swelling. Electronic Signature(s) Signed: 08/04/2021 4:30:05 PM By: Worthy Keeler PA-C Signed: 08/04/2021 4:30:15 PM By: Donnamarie Poag Entered By: Donnamarie Poag on 08/04/2021 16:17:30 [name], [name] ([id number]) [name], [name] ([id number]) -------------------------------------------------------------------------------- Problem List Details Patient Name: [name], [name] Date of Service: 08/04/2021 3:15 PM Medical Record Number: [id number] Patient Account Number: [id number] Date of Birth/Sex: 05-15-1928 (88 y.o. F) Treating RN: Donnamarie Poag Primary Care Provider: Viviana Simpler Other Clinician: Referring Provider: Viviana Simpler Treating Provider/Extender: Skipper Cliche in Treatment: 25 Active Problems ICD-10 Encounter Code Description Active Date MDM Diagnosis L89.513 Pressure ulcer of right ankle, stage 3 10/21/2020 No Yes I73.89 Other specified peripheral vascular diseases 10/21/2020 No Yes L89.623 Pressure ulcer of left heel, stage 3 10/21/2020 No Yes I10 Essential  (primary) hypertension 10/21/2020 No Yes I25.10 Atherosclerotic heart disease of native coronary artery without angina 10/21/2020 No Yes pectoris Inactive Problems Resolved Problems ICD-10 Code Description Active Date Resolved Date S51.802A Unspecified open wound of left forearm, initial encounter 10/21/2020 10/21/2020 L89.312 Pressure ulcer of right buttock, stage 2 01/27/2021 01/27/2021 Electronic Signature(s) Signed: 08/04/2021 2:43:17 PM By: Worthy Keeler PA-C Entered By: Worthy Keeler on 08/04/2021 14:43:16 [name], [name] ([id number]) -------------------------------------------------------------------------------- Progress Note Details Patient Name: [name] Date of Service: 08/04/2021 3:15 PM Medical Record Number: [id number] Patient Account Number: [id number] Date of Birth/Sex: 1927/09/08 (88 y.o. F) Treating RN: Donnamarie Poag Primary Care Provider: Viviana Simpler Other Clinician: Referring Provider: Viviana Simpler Treating Provider/Extender: Skipper Cliche in Treatment: 64 Subjective Chief Complaint Information obtained from Patient Pressure ulcer right ankle and left heel History of Present Illness (HPI) 10/21/2020 upon evaluation today patient presents for initial inspection here in our clinic concerning issues that she has been having quite some time in regard to her ankle and since she has been in the hospital with regard to the heel. The ankle in fact has been 5-6 years at least I am told. With that being said the heel ulcer occurred when she was in the hospital in December for hip surgery when she fractured her hip. She also was in the hospital Tuesday for altered mental status. Really there was nothing that was identified as the cause for this. She did have a. Fortunately there does not appear to be any signs of infection she tells me that she has had she believes arterial studies At [facility] clinic I could not find Those studies at this point. Nonetheless I will  continue to look and see what I can find before Then. The patient also has a skin tear on her arm this occurred more recently when she bumped this at home. The patient does have a history of hypertension, coronary artery disease, and peripheral vascular disease stated. 10/28/2020 I was able to find the patient's chart currently which shows that she did have an arterial study performed in May 2019. This showed that she had a abnormal right toe brachial index and a normal left toe brachial index. She was noncompressible as far as ABIs were concerned. She did appear to have triphasic flow at that time. Unfortunately the wound that is commented on in the report that I printed off  and read mentions the same wound on the ankle that we are still dealing with at this point. Unfortunately this has not healed. And its been quite sometime about 3 years now. Fortunately there does not appear to be any signs of active infection systemically at this point. I think that the patient has done well with the Santyl over the past week which is good news. Patient's caregiver which is her daughter-in-law is concerned about the fact that she really does not feel qualified to be able to change the dressings and take care of this issue. There does not appear to be any signs of anything untoward going on at this point. I think she is done a great job applying the [employer] and I think that has done a great job for the patient is for soften up some of the necrotic tissue. With that being said I think the Xeroform on the arm also has done excellent. In general I am very pleased with where we stand. And I told the patient's daughter-in- law as well that I also feel like she has done a great job over the past week taking care of her mother. 11/11/2020 upon evaluation today patient appears to be doing well with regard to her wounds. She has been tolerating the dressing changes without complication her daughters been applying the Santyl  which has done a great job. Ho with that being said I think now that we have good arterial study showing we can go ahead and proceed with sharp debridement at this point. 11/25/2020 upon evaluation today patient appears to be doing well with regard to her wounds. The Santyl has really helped to clean things up and overall she is doing quite excellent at this point. Fortunately there does not appear to be any signs of infection which is great news and in general extremely pleased. I do believe some debridement is in order and hopefully will be able to get her into a collagen dressing after this. 12/23/2020 upon evaluation today patient appears to be doing well with regard to her wound all things considered. Unfortunately it does sound like her daughter decided to let this air out because she felt like it was getting so wet. It sounds like she got border gauze dressings instead of border foam therefore there is really Nothing to catch the excess drainage which I think is the problem they were worried about the smell. Fortunately there does not appear to be any signs of active infection which is great news. Nonetheless I do not think we want to leave this just open to air. 01/13/2021 upon evaluation today patient's wounds actually appear to be about as good as have seen since have been taking care of her. Fortunately there does not appear to be any evidence of infection which is great and overall the biggest issue I see is that of fluid buildup. I think we need to do something to try to help manage this. I think if we can control her edema we get the wounds to heal more effectively. 01/27/2021 upon evaluation today patient appears to be doing well in regard to the wounds on her right lateral malleolus as well as the left heel and the right ischial tuberosity is unfortunately a new area that has arisen since we last saw her. She tells me currently that that is from sitting too much. With that being said this  actually appears to be doing the worst of anything so far that I see today. Fortunately there  does not appear to be any signs of infection this is at least good news. Compression wrap seem to be doing excellent for her as far as the legs are concerned. 02/03/2021 upon evaluation today patient appears to be doing well with regard to all of her wounds. She has been tolerating the dressing changes without complication. Fortunately there is no signs of active infection at this time. No fevers, chills, nausea, vomiting, or diarrhea. 02/10/2021 upon evaluation today patient appears to be doing well with regard to her wounds. With that being said there is some slight evidence of hypergranulation in regard to the right ankle in particular and a little bit in regard to the left heel. I think Hydrofera Blue might be a better option to go to at this point based on what I am seeing especially since both of these wounds in particular seem to be a little bit stalled. I discussed that with the patient and her daughter today. With regard to the hip this is doing great with the collagen I think will get very close to complete closure I recommend we continue with the collagen. 02/17/2021 upon evaluation today patient appears to be doing excellent in regard to her heel ulcers. She has been tolerating the dressing changes without complication and overall I am extremely pleased with where things stand today. There does not appear to be any signs of active infection which is great news. Overall I may think that we are headed in the proper direction based on what I am seeing. 02/24/2021 upon evaluation today patient's wounds actually are showing signs of improvement in regard to her heels both are doing awesome. In [place], [place] ([id number]) regard to her hip location this is actually reopened after being closed last week and to be perfectly honest I am more concerned here about the fact that pressures causing this issue I do  not think it has anything to do with her not having the collagen in place again it was completely healed last week there was no reason for the collagen to be there. 03/03/2021 upon evaluation today patient appears to be doing well with regard to her wounds. Everything is showing signs of improvement and doing some much better as far as the overall size of the wound. Fortunately I think we are headed in the right direction. 03/10/2021 upon evaluation today patient appears to be doing well with regard to her wound. She is tolerating the dressing changes without complication. The left heel pretty much appears to be almost completely healed although I think it is probably can be 1 more week before I can call this 100% slough. The right ankle is significantly improved with that being said its not completely closed. With regard to the right hip this is completely closed. Overall I am very pleased with where things stand I think were getting very close to complete resolution. I discussed all this with the patient and her daughter-in-law today. 03/17/2021 upon evaluation today patient appears to be doing well with regard to her wounds. Fortunately there does not appear to be any signs of active infection which is great news and overall very pleased with where the patient stands today. I am in general thankful that overall she is making great progress here. There is good to be a little bit of debridement here to clear away some of the necrotic debris today on both wound locations. 03/24/2021 upon evaluation today patient appears to be doing excellent in regard to her wound. She has  been tolerating the dressing changes without complication. Fortunately there does not appear to be any evidence of infection the left foot is completely healed this is great news. On the right at the ankle region this is still open though I do not think the alginate did quite as well as I was hoping as far as getting this dried out.  I think that we may just want to switch back to the [facility] which has done well up to this point. I was just hopeful this would wrap things up completely for [place]. 03/31/2021 upon evaluation today patient appears to be doing well with regard to her wound. This is on the right side which appears to be doing better than last week I think is definitely measuring smaller and this is great news. Overall I am very pleased with where we stand today. I do believe the [facility] is doing better for her. 04/14/2021 upon evaluation today patient appears to be doing well with regard to her wound. I am very pleased with the way it stands I think that she is headed in the right direction. Unfortunately she is having issues with the Tubigrip neither she nor her daughter-in-law who is the primary caregiver can get this on. Subsequently that means that they have been having a very difficult time with complying with the necessary things for this left leg. With that being said I am really not sure what to do to help in that regard. We can always try a bigger that is larger size Tubigrip but at the same time that means that she may not get as good compression and it may not keep things under control the only way to know is to try. 04/21/2021 upon evaluation today patient appears to be doing well in regard to her wound. She has been tolerating the dressing changes without complication and in fact the right ankle ulcer is actually showing signs of excellent improvement I am very pleased with where things stand today. No fevers, chills, nausea, vomiting, or diarrhea. 04/28/2021 upon evaluation today patient appears to be doing well with regard to her ankle ulcer. This is actually showing signs of good improvement which is great news and overall very pleased with where we stand today. No fevers, chills, nausea, vomiting, or diarrhea. 05/05/2021 upon evaluation today patient's wound is actually showing signs of  significant improvement and overall I am extremely pleased with where we stand today. There does not appear to be any signs of active infection which is great news and overall I am extremely pleased with where we stand at this point. No fevers, chills, nausea, vomiting, or diarrhea. 05/12/2021 upon evaluation today patient appears to be doing okay in regard to her wound is measuring a little bit smaller today that is good news. With that being said she still continues to have significant issues here with an open wound on the lateral aspect of her ankle. Fortunately there does not appear to be any signs of active infection systemically which is great news. No fevers, chills, nausea, vomiting, or diarrhea. 06/13/2021 upon evaluation today patient unfortunately continues to have issues with her right leg. Specifically the ankle region where there is an open wound. Previously the wound on the left leg had completely closed. Nonetheless this left heel has reopened as well. I am going to perform some debridement in regard to the heel ulcer appears to be a fluid-filled blister underneath this area. She has not been here for such a long time  due to the fact that she was very sick and they were not able to bring her out. That is the reason we have not seen her in over the past month. 06/20/2021 upon evaluation patient's wounds currently are doing well on the left heel this appears to be healed. There does not appear to be any signs of drainage at this time which is great news. No fevers, chills, nausea, vomiting, or diarrhea. On the right heel there is still an open wound but I feel like we are showing some signs of improvement this is still good to take a bit of time. 06/27/2021 upon evaluation today patient appears to be doing better in regard to her wound. This is actually showing signs of good improvement which is great news. Fortunately I do not see any signs of infection currently which is great and overall  we are finally seeing some actual decrease in the size I think that the compression wraps are making a big difference here. 12/27 some debris on the surface. No evidence of surrounding infection we have been using collagen 07/21/2021 upon evaluation today patient appears to be doing well with regard to her wound. She has been tolerating the dressing changes without complication. Fortunately I do not see any evidence of active infection locally nor systemically at this time which is great news and overall I think the wound making excellent progress here. 08/04/2021 upon evaluation today patient's wound is showing signs of being a little bit moist and really not significantly smaller. Fortunately I do not see any signs of active infection locally nor systemically at this time that is good news. Nonetheless I do feel like she is doing quite well otherwise and her left leg is still doing awesome. Objective [name], BLOWE ([id number]) Constitutional Well-nourished and well-hydrated in no acute distress. Vitals Time Taken: 3:32 PM, Height: 66 in, Weight: 76 lbs, BMI: 12.3, Temperature: 98.2 F, Pulse: 64 bpm, Respiratory Rate: 16 breaths/min, Blood Pressure: 158/82 mmHg. Respiratory normal breathing without difficulty. Psychiatric this patient is able to make decisions and demonstrates good insight into disease process. Alert and Oriented x 3. pleasant and cooperative. General Notes: Upon inspection patient's wound did not show any signs of significant slough buildup so that is good news. Nonetheless I do believe that she is having quite a bit of drainage and I think maybe switching back to silver alginate dressing may be best. Integumentary (Hair, Skin) Wound #1 status is Open. Original cause of wound was Gradually Appeared. The date acquired was: 10/22/2014. The wound has been in treatment 41 weeks. The wound is located on the Right,Lateral Malleolus. The wound measures 0.9cm length x 0.8cm width x  0.2cm depth; 0.565cm^2 area and 0.113cm^3 volume. There is Fat Layer (Subcutaneous Tissue) exposed. There is no tunneling or undermining noted. There is a large amount of serosanguineous drainage noted. The wound margin is flat and intact. There is medium (34-66%) red, hyper - granulation within the wound bed. There is a medium (34-66%) amount of necrotic tissue within the wound bed including Adherent Slough. Assessment Active Problems ICD-10 Pressure ulcer of right ankle, stage 3 Other specified peripheral vascular diseases Pressure ulcer of left heel, stage 3 Essential (primary) hypertension Atherosclerotic heart disease of native coronary artery without angina pectoris Plan Follow-up Appointments: Return Appointment in 1 week. Nurse Visit as needed Bathing/ Shower/ Hygiene: May shower with wound dressing protected with water repellent cover or cast protector. No tub bath. Anesthetic (Use 'Patient Medications' Section for Anesthetic Order Entry):  Lidocaine applied to wound bed Edema Control - Lymphedema / Segmental Compressive Device / Other: Elevate legs to the level of the heart and pump ankles as often as possible Elevate leg(s) parallel to the floor when sitting. DO YOUR BEST to sleep in the bed at night. DO NOT sleep in your recliner. Long hours of sitting in a recliner leads to swelling of the legs and/or potential wounds on your backside. Non-Wound Condition: Additional non-wound orders/instructions: - ABD bilateral to pad shins anytime compression wrap is used Additional Orders / Instructions: Follow Nutritious Diet and Increase Protein Intake WOUND #1: - Malleolus Wound Laterality: Right, Lateral Cleanser: Soap and Water Every Other Day/15 Days Discharge Instructions: Gently cleanse wound with antibacterial soap, rinse and pat dry prior to dressing wounds Primary Dressing: Silvercel Small 2x2 (in/in) Every Other Day/15 Days Discharge Instructions: Apply Silvercel Small  2x2 (in/in) as instructed Secondary Dressing: Gauze Every Other Day/15 Days Discharge Instructions: small dry gauze under BF Secondary Dressing: Zetuvit Plus Silicone Border Dressing 4x4 (in/in) Every Other Day/15 Days Secured With: Tubigrip Size C, 2.75x10 (in/yd) Every Other Day/15 Days Discharge Instructions: Apply 3 Tubigrip C 3-finger-widths below knee to base of toes to secure dressing and/or for swelling. YURIDIANA, FORMANEK ([id number]) 1. Would recommend currently that we going to continue with the wound care measures as before and the patient is in agreement with the plan. This includes the use of the border foam dressing to cover and were again actually just switched to primary dressing today. 2. We will switch from the collagen to a silver alginate dressing we will get a give that a shot and see how this does over the next week. 3. I am also can recommend that we continue with the Tubigrip which I think is helping with edema control. We will see patient back for reevaluation in 1 week here in the clinic. If anything worsens or changes patient will contact our office for additional recommendations. Electronic Signature(s) Signed: 08/04/2021 4:27:43 PM By: Worthy Keeler PA-C Entered By: Worthy Keeler on 08/04/2021 16:27:42 Gaul, [name] ([id number]) -------------------------------------------------------------------------------- SuperBill Details Patient Name: KOURTNEY, TERRIQUEZ Date of Service: 08/04/2021 Medical Record Number: [id number] Patient Account Number: [id number] Date of Birth/Sex: [birth year] (88 y.o. F) Treating RN: Donnamarie Poag Primary Care Provider: Viviana Simpler Other Clinician: Referring Provider: Viviana Simpler Treating Provider/Extender: Skipper Cliche in Treatment: 41 Diagnosis Coding ICD-10 Codes Code Description [phone] Pressure ulcer of right ankle, stage 3 I73.89 Other specified peripheral vascular diseases L89.623 Pressure ulcer of left heel, stage  3 I10 Essential (primary) hypertension I25.10 Atherosclerotic heart disease of native coronary artery without angina pectoris Facility Procedures CPT4 Code: [id number] Description: [phone] - WOUND CARE VISIT-LEV 2 EST PT Modifier: Quantity: 1 Physician Procedures CPT4 Code: [id number] Description: 99214 - WC PHYS LEVEL 4 - EST PT Modifier: Quantity: 1 CPT4 Code: Description: ICD-10 Diagnosis Description L89.513 Pressure ulcer of right ankle, stage 3 I73.89 Other specified peripheral vascular diseases L89.623 Pressure ulcer of left heel, stage 3 I10 Essential (primary) hypertension Modifier: Quantity: Electronic Signature(s) Signed: 08/04/2021 4:29:08 PM By: Worthy Keeler PA-C Entered By: Worthy Keeler on 08/04/2021 16:29:07

## 2021-08-11 ENCOUNTER — Encounter: Payer: PPO | Attending: Physician Assistant | Admitting: Physician Assistant

## 2021-08-11 ENCOUNTER — Other Ambulatory Visit: Payer: Self-pay

## 2021-08-11 DIAGNOSIS — L89623 Pressure ulcer of left heel, stage 3: Secondary | ICD-10-CM | POA: Insufficient documentation

## 2021-08-11 DIAGNOSIS — I70233 Atherosclerosis of native arteries of right leg with ulceration of ankle: Secondary | ICD-10-CM | POA: Diagnosis not present

## 2021-08-11 DIAGNOSIS — I10 Essential (primary) hypertension: Secondary | ICD-10-CM | POA: Diagnosis not present

## 2021-08-11 DIAGNOSIS — L89513 Pressure ulcer of right ankle, stage 3: Secondary | ICD-10-CM | POA: Diagnosis not present

## 2021-08-11 DIAGNOSIS — I7389 Other specified peripheral vascular diseases: Secondary | ICD-10-CM | POA: Diagnosis not present

## 2021-08-11 DIAGNOSIS — I70244 Atherosclerosis of native arteries of left leg with ulceration of heel and midfoot: Secondary | ICD-10-CM | POA: Diagnosis not present

## 2021-08-11 DIAGNOSIS — I2510 Atherosclerotic heart disease of native coronary artery without angina pectoris: Secondary | ICD-10-CM | POA: Diagnosis not present

## 2021-08-11 NOTE — Progress Notes (Addendum)
GREYDIS, STLOUIS ([id number]) Visit Report for 08/11/2021 Arrival Information Details Patient Name: [name], [name] Date of Service: 08/11/2021 3:15 PM Medical Record Number: [id number] Patient Account Number: [id number] Date of Birth/Sex: Nov 16, 1927 (88 y.o. F) Treating RN: Cornell Barman Primary Care Estela Vinal: Viviana Simpler Other Clinician: Referring Ayo Smoak: Viviana Simpler Treating Revella Shelton/Extender: Skipper Cliche in Treatment: 67 Visit Information History Since Last Visit Added or deleted any medications: No Patient Arrived: Walker Pain Present Now: No Arrival Time: 15:04 Accompanied By: daughter Transfer Assistance: None Patient Identification Verified: Yes Secondary Verification Process Completed: Yes Patient Requires Transmission-Based Precautions: No Patient Has Alerts: Yes Patient Alerts: NOT DIABETIC TBI left .77 TBI right .76 Electronic Signature(s) Signed: 08/15/2021 9:26:54 AM By: Gretta Cool, BSN, RN, CWS, Kim RN, BSN Entered By: Gretta Cool, BSN, RN, CWS, Kim on 08/11/2021 15:05:22 [name] ([id number]) -------------------------------------------------------------------------------- Clinic Level of Care Assessment Details Patient Name: [name] Date of Service: 08/11/2021 3:15 PM Medical Record Number: [id number] Patient Account Number: [id number] Date of Birth/Sex: Dec 23, 1927 (88 y.o. F) Treating RN: Cornell Barman Primary Care Stefan Karen: Viviana Simpler Other Clinician: Referring Lindamarie Maclachlan: Viviana Simpler Treating Clio Gerhart/Extender: Skipper Cliche in Treatment: 42 Clinic Level of Care Assessment Items TOOL 4 Quantity Score _0  - Use when only an EandM is performed on FOLLOW-UP visit 0 ASSESSMENTS - Nursing Assessment / Reassessment X - Reassessment of Co-morbidities (includes updates in patient status) 1 10 X- 1 5 Reassessment of Adherence to Treatment Plan ASSESSMENTS - Wound and Skin Assessment / Reassessment X - Simple Wound Assessment / Reassessment - one  wound 1 5 _1  - 0 Complex Wound Assessment / Reassessment - multiple wounds _2  - 0 Dermatologic / Skin Assessment (not related to wound area) ASSESSMENTS - Focused Assessment _3  - Circumferential Edema Measurements - multi extremities 0 _4  - 0 Nutritional Assessment / Counseling / Intervention _5  - 0 Lower Extremity Assessment (monofilament, tuning fork, pulses) _6  - 0 Peripheral Arterial Disease Assessment (using hand held doppler) ASSESSMENTS - Ostomy and/or Continence Assessment and Care _7  - Incontinence Assessment and Management 0 _8  - 0 Ostomy Care Assessment and Management (repouching, etc.) PROCESS - Coordination of Care X - Simple Patient / Family Education for ongoing care 1 15 _9  - 0 Complex (extensive) Patient / Family Education for ongoing care _10  - 0 Staff obtains Programmer, systems, Records, Test Results / Process Orders _11  - 0 Staff telephones HHA, Nursing Homes / Clarify orders / etc _12  - 0 Routine Transfer to another Facility (non-emergent condition) _13  - 0 Routine Hospital Admission (non-emergent condition) _14  - 0 New Admissions / Biomedical engineer / Ordering NPWT, Apligraf, etc. _15  - 0 Emergency Hospital Admission (emergent condition) X- 1 10 Simple Discharge Coordination _16  - 0 Complex (extensive) Discharge Coordination PROCESS - Special Needs _17  - Pediatric / Minor Patient Management 0 _18  - 0 Isolation Patient Management _19  - 0 Hearing / Language / Visual special needs _20  - 0 Assessment of Community assistance (transportation, D/C planning, etc.) _21  - 0 Additional assistance / Altered mentation _22  - 0 Support Surface(s) Assessment (bed, cushion, seat, etc.) INTERVENTIONS - Wound Cleansing / Measurement CLIO, GERHART ([id number]) X- 1 5 Simple Wound Cleansing - one wound _23  - 0 Complex Wound Cleansing - multiple wounds X- 1 5 Wound Imaging (photographs - any number of wounds) _24  - 0 Wound Tracing (instead of photographs) X- 1 5 Simple  Wound Measurement - one wound _25  - 0 Complex Wound Measurement - multiple wounds INTERVENTIONS - Wound Dressings _26  - Small Wound Dressing  one or multiple wounds 0 X- 1 15 Medium Wound Dressing one or multiple wounds _0  - 0 Large Wound Dressing one or multiple wounds <ZOXWRUEAVWUJWJXB>_1<\/YNWGNFAOZHYQMVHQ>_4  - 0 Application of Medications - topical <ONGEXBMWUXLKGMWN>_0<\/UVOZDGUYQIHKVQQV>_9  - 0 Application of Medications - injection INTERVENTIONS - Miscellaneous _3  - External ear exam 0 _4  - 0 Specimen Collection (cultures, biopsies, blood, body fluids, etc.) _5  - 0 Specimen(s) / Culture(s) sent or taken to Lab for analysis _6  - 0 Patient Transfer (multiple staff / Civil Service fast streamer / Similar devices) _7  - 0 Simple Staple / Suture removal (25 or less) _8  - 0 Complex Staple / Suture removal (26 or more) _9  - 0 Hypo / Hyperglycemic Management (close monitor of Blood Glucose) _10  - 0 Ankle / Brachial Index (ABI) - do not check if billed separately X- 1 5 Vital Signs Has the patient been seen at the hospital within the last three years: Yes Total Score: 80 Level Of Care: New/Established - Level 3 Electronic Signature(s) Signed: 08/15/2021 9:26:54 AM By: Gretta Cool, BSN, RN, CWS, Kim RN, BSN Entered By: Gretta Cool, BSN, RN, CWS, Kim on 08/11/2021 15:33:00 Deegan, [name] ([id number]) -------------------------------------------------------------------------------- Encounter Discharge Information Details Patient Name: HAYDON, KALMAR Date of Service: 08/11/2021 3:15 PM Medical Record Number: [id number] Patient Account Number: [id number] Date of Birth/Sex: September 12, 1927 (88 y.o. F) Treating RN: Cornell Barman Primary Care Aislin Onofre: Viviana Simpler Other Clinician: Referring Meleny Tregoning: Viviana Simpler Treating Jolee Critcher/Extender: Skipper Cliche in Treatment: 46 Encounter Discharge Information Items Discharge Condition: Stable Discharge Destination: Home Schedule Follow-up Appointment: Yes Clinical Summary of Care: Electronic Signature(s) Signed: 08/15/2021 9:25:30 AM By: Gretta Cool,  BSN, RN, CWS, Kim RN, BSN Entered By: Gretta Cool, BSN, RN, CWS, Kim on 08/15/2021 09:25:30 Tullier, [name] ([id number]) -------------------------------------------------------------------------------- Lower Extremity Assessment Details Patient Name: MIAMARIE, MOLL Date of Service: 08/11/2021 3:15 PM Medical Record Number: [id number] Patient Account Number: [id number] Date of Birth/Sex: 14-Aug-1927 (88 y.o. F) Treating RN: Cornell Barman Primary Care Graves Nipp: Viviana Simpler Other Clinician: Referring Deshonna Trnka: Viviana Simpler Treating Fallon Haecker/Extender: Skipper Cliche in Treatment: 42 Edema Assessment Assessed: [Left: No] [Right: No] [Left: Edema] [Right: :] Calf Left: Right: Point of Measurement: 33 cm From Medial Instep 22 cm Ankle Left: Right: Point of Measurement: 9 cm From Medial Instep 21.3 cm Vascular Assessment Pulses: Dorsalis Pedis Palpable: [Right:Yes] Electronic Signature(s) Signed: 08/15/2021 9:26:54 AM By: Gretta Cool, BSN, RN, CWS, Kim RN, BSN Entered By: Gretta Cool, BSN, RN, CWS, Kim on 08/11/2021 15:10:50 Thatch, [name] ([id number]) -------------------------------------------------------------------------------- Multi Wound Chart Details Patient Name: ADALEAH, FORGET Date of Service: 08/11/2021 3:15 PM Medical Record Number: [id number] Patient Account Number: [id number] Date of Birth/Sex: [birth year] (88 y.o. F) Treating RN: Cornell Barman Primary Care Zaydin Billey: Viviana Simpler Other Clinician: Referring Kolden Dupee: Viviana Simpler Treating Brittany Amirault/Extender: Skipper Cliche in Treatment: 42 Vital Signs Height(in): 66 Pulse(bpm): 31 Weight(lbs): 55 Blood Pressure(mmHg): 187/84 Body Mass Index(BMI): 12.3 Temperature(F): 98 Respiratory Rate(breaths/min): 16 Photos: [1:No Photos] [N/A:N/A] Wound Location: [1:Right, Lateral Malleolus] [N/A:N/A] Wounding Event: [1:Gradually Appeared] [N/A:N/A] Primary Etiology: [1:Pressure Ulcer] [N/A:N/A] Comorbid History: [1:Arrhythmia,  Coronary Artery Disease, Hypertension, Peripheral Arterial Disease, History of pressure wounds, Osteoarthritis] [N/A:N/A] Date Acquired: [1:10/22/2014] [N/A:N/A] Weeks of Treatment: [1:42] [N/A:N/A] Wound Status: [1:Open] [N/A:N/A] Wound Recurrence: [1:No] [N/A:N/A] Measurements L x W x D (cm) [1:1x1.2x0.2] [N/A:N/A] Area (cm) : [1:0.942] [N/A:N/A] Volume (cm) : [1:0.188] [N/A:N/A] % Reduction in Area: [1:-20.00%] [N/A:N/A] % Reduction in Volume: [1:-138.00%] [N/A:N/A] Classification: [1:Category/Stage III] [N/A:N/A] Exudate Amount: [1:Large] [N/A:N/A] Exudate Type: [1:Serosanguineous] [N/A:N/A] Exudate Color: [1:red, brown] [N/A:N/A] Wound Margin: [1:Flat and  Intact] [N/A:N/A] Granulation Amount: [1:Small (1-33%)] [N/A:N/A] Granulation Quality: [1:Red] [N/A:N/A] Necrotic Amount: [1:Large (67-100%)] [N/A:N/A] Exposed Structures: [1:Fat Layer (Subcutaneous Tissue): N/A Yes Fascia: No Tendon: No Muscle: No Joint: No Bone: No None] [N/A:N/A] Treatment Notes Electronic Signature(s) Signed: 08/15/2021 9:23:09 AM By: Gretta Cool, BSN, RN, CWS, Kim RN, BSN Entered By: Gretta Cool, BSN, RN, CWS, Kim on 08/15/2021 09:23:09 [name] ([id number]) -------------------------------------------------------------------------------- [place] Details Patient Name: GYSELLE, MATTHEW. Date of Service: 08/11/2021 3:15 PM Medical Record Number: [id number] Patient Account Number: [id number] Date of Birth/Sex: August 02, 1927 (88 y.o. F) Treating RN: Cornell Barman Primary Care Shontez Sermon: Viviana Simpler Other Clinician: Referring Immaculate Crutcher: Viviana Simpler Treating Whitley Strycharz/Extender: Skipper Cliche in Treatment: 33 Active Inactive Wound/Skin Impairment Nursing Diagnoses: Impaired tissue integrity Goals: Patient/caregiver will verbalize understanding of skin care regimen Date Initiated: 10/21/2020 Date Inactivated: 12/23/2020 Target Resolution Date: 12/21/2020 Goal Status: Met Ulcer/skin breakdown  will have a volume reduction of 30% by week 4 Date Initiated: 10/21/2020 Date Inactivated: 11/25/2020 Target Resolution Date: 11/20/2020 Goal Status: Met Ulcer/skin breakdown will have a volume reduction of 50% by week 8 Date Initiated: 10/21/2020 Date Inactivated: 12/23/2020 Target Resolution Date: 12/21/2020 Goal Status: Met Ulcer/skin breakdown will have a volume reduction of 80% by week 12 Date Initiated: 10/21/2020 Date Inactivated: 02/17/2021 Target Resolution Date: 01/20/2021 Goal Status: Met Ulcer/skin breakdown will heal within 14 weeks Date Initiated: 10/21/2020 Target Resolution Date: 09/05/2021 Goal Status: Active Interventions: Assess patient/caregiver ability to obtain necessary supplies Assess patient/caregiver ability to perform ulcer/skin care regimen upon admission and as needed Assess ulceration(s) every visit Notes: Electronic Signature(s) Signed: 08/15/2021 9:22:59 AM By: Gretta Cool, BSN, RN, CWS, Kim RN, BSN Entered By: Gretta Cool, BSN, RN, CWS, Kim on 08/15/2021 09:22:59 Eid, [name] ([id number]) -------------------------------------------------------------------------------- Pain Assessment Details Patient Name: GREGORY, DOWE Date of Service: 08/11/2021 3:15 PM Medical Record Number: [id number] Patient Account Number: [id number] Date of Birth/Sex: 1928/05/13 (88 y.o. F) Treating RN: Cornell Barman Primary Care Takera Rayl: Viviana Simpler Other Clinician: Referring Azlan Hanway: Viviana Simpler Treating Ellon Marasco/Extender: Skipper Cliche in Treatment: 42 Active Problems Location of Pain Severity and Description of Pain Patient Has Paino No Site Locations Pain Management and Medication Current Pain Management: Electronic Signature(s) Signed: 08/15/2021 9:26:54 AM By: Gretta Cool, BSN, RN, CWS, Kim RN, BSN Entered By: Gretta Cool, BSN, RN, CWS, Kim on 08/11/2021 15:06:02 [name]  ([id number]) -------------------------------------------------------------------------------- Patient/Caregiver Education Details Patient Name: HAILEI, BESSER Date of Service: 08/11/2021 3:15 PM Medical Record Number: [id number] Patient Account Number: [id number] Date of Birth/Gender: 1928-03-08 (88 y.o. F) Treating RN: Cornell Barman Primary Care Physician: Viviana Simpler Other Clinician: Referring Physician: Viviana Simpler Treating Physician/Extender: Skipper Cliche in Treatment: 76 Education Assessment Education Provided To: Patient and Caregiver Education Topics Provided Venous: Wound/Skin Impairment: Handouts: Caring for Your Ulcer Methods: Demonstration, Explain/Verbal Responses: State content correctly Electronic Signature(s) Signed: 08/15/2021 9:26:54 AM By: Gretta Cool, BSN, RN, CWS, Kim RN, BSN Entered By: Gretta Cool, BSN, RN, CWS, Kim on 08/15/2021 09:24:36 Brockmann, [name] ([id number]) -------------------------------------------------------------------------------- Wound Assessment Details Patient Name: YISEL, MEGILL Date of Service: 08/11/2021 3:15 PM Medical Record Number: [id number] Patient Account Number: [id number] Date of Birth/Sex: 1928/06/16 (88 y.o. F) Treating RN: Cornell Barman Primary Care Aleksandar Duve: Viviana Simpler Other Clinician: Referring Caylynn Minchew: Viviana Simpler Treating Jaryd Drew/Extender: Skipper Cliche in Treatment: 42 Wound Status Wound Number: 1 Primary Pressure Ulcer Etiology: Wound Location: Right, Lateral Malleolus Wound Open Wounding Event: Gradually Appeared Status: Date Acquired: 10/22/2014 Comorbid Arrhythmia, Coronary Artery Disease, Hypertension, Weeks Of Treatment: 42 History: Peripheral Arterial  Disease, History of pressure wounds, Clustered Wound: No Osteoarthritis Wound Measurements Length: (cm) 1 Width: (cm) 1.2 Depth: (cm) 0.2 Area: (cm) 0.942 Volume: (cm) 0.188 % Reduction in Area: -20% % Reduction in Volume:  -138% Epithelialization: None Wound Description Classification: Category/Stage III Wound Margin: Flat and Intact Exudate Amount: Large Exudate Type: Serosanguineous Exudate Color: red, brown Foul Odor After Cleansing: No Slough/Fibrino Yes Wound Bed Granulation Amount: Small (1-33%) Exposed Structure Granulation Quality: Red Fascia Exposed: No Necrotic Amount: Large (67-100%) Fat Layer (Subcutaneous Tissue) Exposed: Yes Necrotic Quality: Adherent Slough Tendon Exposed: No Muscle Exposed: No Joint Exposed: No Bone Exposed: No Treatment Notes Wound #1 (Malleolus) Wound Laterality: Right, Lateral Cleanser Soap and Water Discharge Instruction: Gently cleanse wound with antibacterial soap, rinse and pat dry prior to dressing wounds Peri-Wound Care Topical Primary Dressing Silvercel Small 2x2 (in/in) Discharge Instruction: Apply Silvercel Small 2x2 (in/in) as instructed Secondary Dressing Gauze Discharge Instruction: small dry gauze under BF Zetuvit Plus Silicone Border Dressing 4x4 (in/in) Secured With Tubigrip Size C, 2.75x10 (in/yd) Dockendorf, [name] ([id number]) Discharge Instruction: Apply 3 Tubigrip C 3-finger-widths below knee to base of toes to secure dressing and/or for swelling. Compression Wrap Compression Stockings Add-Ons Electronic Signature(s) Signed: 08/15/2021 9:26:54 AM By: Gretta Cool, BSN, RN, CWS, Kim RN, BSN Entered By: Gretta Cool, BSN, RN, CWS, Kim on 08/11/2021 15:09:43 Belongia, [name] ([id number]) -------------------------------------------------------------------------------- Vitals Details Patient Name: FLORIS, NEUHAUS Date of Service: 08/11/2021 3:15 PM Medical Record Number: [id number] Patient Account Number: [id number] Date of Birth/Sex: [birth year] (88 y.o. F) Treating RN: Cornell Barman Primary Care Darneisha Windhorst: Viviana Simpler Other Clinician: Referring Judee Hennick: Viviana Simpler Treating Edwardine Deschepper/Extender: Skipper Cliche in Treatment: 42 Vital Signs Time  Taken: 15:05 Temperature (F): 98 Height (in): 66 Pulse (bpm): 67 Weight (lbs): 76 Respiratory Rate (breaths/min): 16 Body Mass Index (BMI): 12.3 Blood Pressure (mmHg): 187/84 Reference Range: 80 - 120 mg / dl Electronic Signature(s) Signed: 08/15/2021 9:26:54 AM By: Gretta Cool, BSN, RN, CWS, Kim RN, BSN Entered By: Gretta Cool, BSN, RN, CWS, Kim on 08/11/2021 15:05:51

## 2021-08-11 NOTE — Progress Notes (Addendum)
Elizabeth, Downs ([id number]) Visit Report for 08/11/2021 Chief Complaint Document Details Patient Name: [name], [name]. Date of Service: 08/11/2021 3:15 PM Medical Record Number: [id number] Patient Account Number: [id number] Date of Birth/Sex: 1928/03/28 (88 y.o. F) Treating RN: Donnamarie Poag Primary Care Provider: Viviana Simpler Other Clinician: Referring Provider: Viviana Simpler Treating Provider/Extender: Skipper Cliche in Treatment: 60 Information Obtained from: Patient Chief Complaint Pressure ulcer right ankle and left heel Electronic Signature(s) Signed: 08/11/2021 2:35:00 PM By: Worthy Keeler PA-C Entered By: Worthy Keeler on 08/11/2021 14:35:00 Bai, [name] ([id number]) -------------------------------------------------------------------------------- HPI Details Patient Name: Elizabeth, Downs Date of Service: 08/11/2021 3:15 PM Medical Record Number: [id number] Patient Account Number: [id number] Date of Birth/Sex: 1928/02/02 (88 y.o. F) Treating RN: Donnamarie Poag Primary Care Provider: Viviana Simpler Other Clinician: Referring Provider: Viviana Simpler Treating Provider/Extender: Skipper Cliche in Treatment: 88 History of Present Illness HPI Description: 10/21/2020 upon evaluation today patient presents for initial inspection here in our clinic concerning issues that she has been having quite some time in regard to her ankle and since she has been in the hospital with regard to the heel. The ankle in fact has been 5-6 years at least I am told. With that being said the heel ulcer occurred when she was in the hospital in December for hip surgery when she fractured her hip. She also was in the hospital Tuesday for altered mental status. Really there was nothing that was identified as the cause for this. She did have a. Fortunately there does not appear to be any signs of infection she tells me that she has had she believes arterial studies At [facility] clinic I could not find  Those studies at this point. Nonetheless I will continue to look and see what I can find before Then. The patient also has a skin tear on her arm this occurred more recently when she bumped this at home. The patient does have a history of hypertension, coronary artery disease, and peripheral vascular disease stated. 10/28/2020 I was able to find the patient's chart currently which shows that she did have an arterial study performed in May 2019. This showed that she had a abnormal right toe brachial index and a normal left toe brachial index. She was noncompressible as far as ABIs were concerned. She did appear to have triphasic flow at that time. Unfortunately the wound that is commented on in the report that I printed off and read mentions the same wound on the ankle that we are still dealing with at this point. Unfortunately this has not healed. And its been quite sometime about 3 years now. Fortunately there does not appear to be any signs of active infection systemically at this point. I think that the patient has done well with the Santyl over the past week which is good news. Patient's caregiver which is her daughter-in-law is concerned about the fact that she really does not feel qualified to be able to change the dressings and take care of this issue. There does not appear to be any signs of anything untoward going on at this point. I think she is done a great job applying the [employer] and I think that has done a great job for the patient is for soften up some of the necrotic tissue. With that being said I think the Xeroform on the arm also has done excellent. In general I am very pleased with where we stand. And I told the patient's daughter-in- law as well that  I also feel like she has done a great job over the past week taking care of her mother. 11/11/2020 upon evaluation today patient appears to be doing well with regard to her wounds. She has been tolerating the dressing changes without  complication her daughters been applying the Santyl which has done a great job. Ho with that being said I think now that we have good arterial study showing we can go ahead and proceed with sharp debridement at this point. 11/25/2020 upon evaluation today patient appears to be doing well with regard to her wounds. The Santyl has really helped to clean things up and overall she is doing quite excellent at this point. Fortunately there does not appear to be any signs of infection which is great news and in general extremely pleased. I do believe some debridement is in order and hopefully will be able to get her into a collagen dressing after this. 12/23/2020 upon evaluation today patient appears to be doing well with regard to her wound all things considered. Unfortunately it does sound like her daughter decided to let this air out because she felt like it was getting so wet. It sounds like she got border gauze dressings instead of border foam therefore there is really Nothing to catch the excess drainage which I think is the problem they were worried about the smell. Fortunately there does not appear to be any signs of active infection which is great news. Nonetheless I do not think we want to leave this just open to air. 01/13/2021 upon evaluation today patient's wounds actually appear to be about as good as have seen since have been taking care of her. Fortunately there does not appear to be any evidence of infection which is great and overall the biggest issue I see is that of fluid buildup. I think we need to do something to try to help manage this. I think if we can control her edema we get the wounds to heal more effectively. 01/27/2021 upon evaluation today patient appears to be doing well in regard to the wounds on her right lateral malleolus as well as the left heel and the right ischial tuberosity is unfortunately a new area that has arisen since we last saw her. She tells me currently that that is  from sitting too much. With that being said this actually appears to be doing the worst of anything so far that I see today. Fortunately there does not appear to be any signs of infection this is at least good news. Compression wrap seem to be doing excellent for her as far as the legs are concerned. 02/03/2021 upon evaluation today patient appears to be doing well with regard to all of her wounds. She has been tolerating the dressing changes without complication. Fortunately there is no signs of active infection at this time. No fevers, chills, nausea, vomiting, or diarrhea. 02/10/2021 upon evaluation today patient appears to be doing well with regard to her wounds. With that being said there is some slight evidence of hypergranulation in regard to the right ankle in particular and a little bit in regard to the left heel. I think Hydrofera Blue might be a better option to go to at this point based on what I am seeing especially since both of these wounds in particular seem to be a little bit stalled. I discussed that with the patient and her daughter today. With regard to the hip this is doing great with the collagen I think will get  very close to complete closure I recommend we continue with the collagen. 02/17/2021 upon evaluation today patient appears to be doing excellent in regard to her heel ulcers. She has been tolerating the dressing changes without complication and overall I am extremely pleased with where things stand today. There does not appear to be any signs of active infection which is great news. Overall I may think that we are headed in the proper direction based on what I am seeing. 02/24/2021 upon evaluation today patient's wounds actually are showing signs of improvement in regard to her heels both are doing awesome. In regard to her hip location this is actually reopened after being closed last week and to be perfectly honest I am more concerned here about the fact that pressures  causing this issue I do not think it has anything to do with her not having the collagen in place again it was completely healed last week there was no reason for the collagen to be there. 03/03/2021 upon evaluation today patient appears to be doing well with regard to her wounds. Everything is showing signs of improvement and LUA, FENG. ([id number]) doing some much better as far as the overall size of the wound. Fortunately I think we are headed in the right direction. 03/10/2021 upon evaluation today patient appears to be doing well with regard to her wound. She is tolerating the dressing changes without complication. The left heel pretty much appears to be almost completely healed although I think it is probably can be 1 more week before I can call this 100% slough. The right ankle is significantly improved with that being said its not completely closed. With regard to the right hip this is completely closed. Overall I am very pleased with where things stand I think were getting very close to complete resolution. I discussed all this with the patient and her daughter-in-law today. 03/17/2021 upon evaluation today patient appears to be doing well with regard to her wounds. Fortunately there does not appear to be any signs of active infection which is great news and overall very pleased with where the patient stands today. I am in general thankful that overall she is making great progress here. There is good to be a little bit of debridement here to clear away some of the necrotic debris today on both wound locations. 03/24/2021 upon evaluation today patient appears to be doing excellent in regard to her wound. She has been tolerating the dressing changes without complication. Fortunately there does not appear to be any evidence of infection the left foot is completely healed this is great news. On the right at the ankle region this is still open though I do not think the alginate did quite as well  as I was hoping as far as getting this dried out. I think that we may just want to switch back to the [facility] which has done well up to this point. I was just hopeful this would wrap things up completely for [place]. 03/31/2021 upon evaluation today patient appears to be doing well with regard to her wound. This is on the right side which appears to be doing better than last week I think is definitely measuring smaller and this is great news. Overall I am very pleased with where we stand today. I do believe the [facility] is doing better for her. 04/14/2021 upon evaluation today patient appears to be doing well with regard to her wound. I am very pleased with the way it  stands I think that she is headed in the right direction. Unfortunately she is having issues with the Tubigrip neither she nor her daughter-in-law who is the primary caregiver can get this on. Subsequently that means that they have been having a very difficult time with complying with the necessary things for this left leg. With that being said I am really not sure what to do to help in that regard. We can always try a bigger that is larger size Tubigrip but at the same time that means that she may not get as good compression and it may not keep things under control the only way to know is to try. 04/21/2021 upon evaluation today patient appears to be doing well in regard to her wound. She has been tolerating the dressing changes without complication and in fact the right ankle ulcer is actually showing signs of excellent improvement I am very pleased with where things stand today. No fevers, chills, nausea, vomiting, or diarrhea. 04/28/2021 upon evaluation today patient appears to be doing well with regard to her ankle ulcer. This is actually showing signs of good improvement which is great news and overall very pleased with where we stand today. No fevers, chills, nausea, vomiting, or diarrhea. 05/05/2021 upon evaluation today  patient's wound is actually showing signs of significant improvement and overall I am extremely pleased with where we stand today. There does not appear to be any signs of active infection which is great news and overall I am extremely pleased with where we stand at this point. No fevers, chills, nausea, vomiting, or diarrhea. 05/12/2021 upon evaluation today patient appears to be doing okay in regard to her wound is measuring a little bit smaller today that is good news. With that being said she still continues to have significant issues here with an open wound on the lateral aspect of her ankle. Fortunately there does not appear to be any signs of active infection systemically which is great news. No fevers, chills, nausea, vomiting, or diarrhea. 06/13/2021 upon evaluation today patient unfortunately continues to have issues with her right leg. Specifically the ankle region where there is an open wound. Previously the wound on the left leg had completely closed. Nonetheless this left heel has reopened as well. I am going to perform some debridement in regard to the heel ulcer appears to be a fluid-filled blister underneath this area. She has not been here for such a long time due to the fact that she was very sick and they were not able to bring her out. That is the reason we have not seen her in over the past month. 06/20/2021 upon evaluation patient's wounds currently are doing well on the left heel this appears to be healed. There does not appear to be any signs of drainage at this time which is great news. No fevers, chills, nausea, vomiting, or diarrhea. On the right heel there is still an open wound but I feel like we are showing some signs of improvement this is still good to take a bit of time. 06/27/2021 upon evaluation today patient appears to be doing better in regard to her wound. This is actually showing signs of good improvement which is great news. Fortunately I do not see any signs of  infection currently which is great and overall we are finally seeing some actual decrease in the size I think that the compression wraps are making a big difference here. 12/27 some debris on the surface. No evidence of surrounding infection  we have been using collagen 07/21/2021 upon evaluation today patient appears to be doing well with regard to her wound. She has been tolerating the dressing changes without complication. Fortunately I do not see any evidence of active infection locally nor systemically at this time which is great news and overall I think the wound making excellent progress here. 08/04/2021 upon evaluation today patient's wound is showing signs of being a little bit moist and really not significantly smaller. Fortunately I do not see any signs of active infection locally nor systemically at this time that is good news. Nonetheless I do feel like she is doing quite well otherwise and her left leg is still doing awesome. 08/11/2021 upon evaluation today patient appears to be doing well with regard to her wound. She has been tolerating the dressing changes without complication. Fortunately there does not appear to be any signs of active infection at this time. The wound appears to be less macerated though I do believe she was moistening the alginate just a little bit I told her she does not need to do that when putting on a longer she voiced understanding and will not do so any longer. Electronic Signature(s) Signed: 08/11/2021 3:28:03 PM By: Worthy Keeler PA-C Entered By: Worthy Keeler on 08/11/2021 15:28:03 [name] ([id number]) -------------------------------------------------------------------------------- Physical Exam Details Patient Name: LAKEENA, DOWNIE Date of Service: 08/11/2021 3:15 PM Medical Record Number: [id number] Patient Account Number: [id number] Date of Birth/Sex: [birth year] (88 y.o. F) Treating RN: Donnamarie Poag Primary Care Provider: Viviana Simpler Other  Clinician: Referring Provider: Viviana Simpler Treating Provider/Extender: Skipper Cliche in Treatment: 27 Constitutional Well-nourished and well-hydrated in no acute distress. Respiratory normal breathing without difficulty. Psychiatric this patient is able to make decisions and demonstrates good insight into disease process. Alert and Oriented x 3. pleasant and cooperative. Notes Upon inspection patient's wound bed actually showed signs of good granulation and epithelization at this point. Fortunately I do not see any signs of infection locally or systemically which is great news and overall very pleased with where we stand currently. Electronic Signature(s) Signed: 08/11/2021 3:28:19 PM By: Worthy Keeler PA-C Entered By: Worthy Keeler on 08/11/2021 15:28:19 [name] ([id number]) -------------------------------------------------------------------------------- Physician Orders Details Patient Name: AYUSHI, PLA Date of Service: 08/11/2021 3:15 PM Medical Record Number: [id number] Patient Account Number: [id number] Date of Birth/Sex: [birth year] (88 y.o. F) Treating RN: Cornell Barman Primary Care Provider: Viviana Simpler Other Clinician: Referring Provider: Viviana Simpler Treating Provider/Extender: Skipper Cliche in Treatment: 59 Verbal / Phone Orders: No Diagnosis Coding ICD-10 Coding Code Description L89.513 Pressure ulcer of right ankle, stage 3 I73.89 Other specified peripheral vascular diseases L89.623 Pressure ulcer of left heel, stage 3 I10 Essential (primary) hypertension I25.10 Atherosclerotic heart disease of native coronary artery without angina pectoris Follow-up Appointments o Return Appointment in 1 week. o Nurse Visit as needed Bathing/ Shower/ Hygiene o May shower with wound dressing protected with water repellent cover or cast protector. o No tub bath. Anesthetic (Use 'Patient Medications' Section for Anesthetic Order Entry) o  Lidocaine applied to wound bed Edema Control - Lymphedema / Segmental Compressive Device / Other o Elevate legs to the level of the heart and pump ankles as often as possible o Elevate leg(s) parallel to the floor when sitting. o DO YOUR BEST to sleep in the bed at night. DO NOT sleep in your recliner. Long hours of sitting in a recliner leads to swelling of the legs and/or potential wounds on  your backside. Additional Orders / Instructions o Follow Nutritious Diet and Increase Protein Intake Wound Treatment Wound #1 - Malleolus Wound Laterality: Right, Lateral Cleanser: Soap and Water Every Other Day/15 Days Discharge Instructions: Gently cleanse wound with antibacterial soap, rinse and pat dry prior to dressing wounds Primary Dressing: Silvercel Small 2x2 (in/in) Every Other Day/15 Days Discharge Instructions: Apply Silvercel Small 2x2 (in/in) as instructed Secondary Dressing: Gauze Every Other Day/15 Days Discharge Instructions: small dry gauze under BF Secondary Dressing: Zetuvit Plus Silicone Border Dressing 4x4 (in/in) Every Other Day/15 Days Secured With: Tubigrip Size C, 2.75x10 (in/yd) Every Other Day/15 Days Discharge Instructions: Apply 3 Tubigrip C 3-finger-widths below knee to base of toes to secure dressing and/or for swelling. Electronic Signature(s) Signed: 08/11/2021 4:48:07 PM By: Worthy Keeler PA-C Signed: 08/15/2021 9:26:54 AM By: Gretta Cool, BSN, RN, CWS, Kim RN, BSN Entered By: Gretta Cool, BSN, RN, CWS, Kim on 08/11/2021 15:25:56 [place], [name] ([id number]) -------------------------------------------------------------------------------- Problem List Details Patient Name: SARAHANNE, NOVAKOWSKI Date of Service: 08/11/2021 3:15 PM Medical Record Number: [id number] Patient Account Number: [id number] Date of Birth/Sex: Sep 21, 1927 (88 y.o. F) Treating RN: Donnamarie Poag Primary Care Provider: Viviana Simpler Other Clinician: Referring Provider: Viviana Simpler Treating  Provider/Extender: Skipper Cliche in Treatment: 97 Active Problems ICD-10 Encounter Code Description Active Date MDM Diagnosis L89.513 Pressure ulcer of right ankle, stage 3 10/21/2020 No Yes I73.89 Other specified peripheral vascular diseases 10/21/2020 No Yes L89.623 Pressure ulcer of left heel, stage 3 10/21/2020 No Yes I10 Essential (primary) hypertension 10/21/2020 No Yes I25.10 Atherosclerotic heart disease of native coronary artery without angina 10/21/2020 No Yes pectoris Inactive Problems Resolved Problems ICD-10 Code Description Active Date Resolved Date S51.802A Unspecified open wound of left forearm, initial encounter 10/21/2020 10/21/2020 L89.312 Pressure ulcer of right buttock, stage 2 01/27/2021 01/27/2021 Electronic Signature(s) Signed: 08/11/2021 2:34:55 PM By: Worthy Keeler PA-C Entered By: Worthy Keeler on 08/11/2021 14:34:55 [place], [name] ([id number]) -------------------------------------------------------------------------------- Progress Note Details Patient Name: [name] Date of Service: 08/11/2021 3:15 PM Medical Record Number: [id number] Patient Account Number: [id number] Date of Birth/Sex: [birth year] (88 y.o. F) Treating RN: Donnamarie Poag Primary Care Provider: Viviana Simpler Other Clinician: Referring Provider: Viviana Simpler Treating Provider/Extender: Skipper Cliche in Treatment: 42 Subjective Chief Complaint Information obtained from Patient Pressure ulcer right ankle and left heel History of Present Illness (HPI) 10/21/2020 upon evaluation today patient presents for initial inspection here in our clinic concerning issues that she has been having quite some time in regard to her ankle and since she has been in the hospital with regard to the heel. The ankle in fact has been 5-6 years at least I am told. With that being said the heel ulcer occurred when she was in the hospital in December for hip surgery when she fractured her hip. She also  was in the hospital Tuesday for altered mental status. Really there was nothing that was identified as the cause for this. She did have a. Fortunately there does not appear to be any signs of infection she tells me that she has had she believes arterial studies At [facility] clinic I could not find Those studies at this point. Nonetheless I will continue to look and see what I can find before Then. The patient also has a skin tear on her arm this occurred more recently when she bumped this at home. The patient does have a history of hypertension, coronary artery disease, and peripheral vascular disease stated. 10/28/2020 I was able to find  the patient's chart currently which shows that she did have an arterial study performed in May 2019. This showed that she had a abnormal right toe brachial index and a normal left toe brachial index. She was noncompressible as far as ABIs were concerned. She did appear to have triphasic flow at that time. Unfortunately the wound that is commented on in the report that I printed off and read mentions the same wound on the ankle that we are still dealing with at this point. Unfortunately this has not healed. And its been quite sometime about 3 years now. Fortunately there does not appear to be any signs of active infection systemically at this point. I think that the patient has done well with the Santyl over the past week which is good news. Patient's caregiver which is her daughter-in-law is concerned about the fact that she really does not feel qualified to be able to change the dressings and take care of this issue. There does not appear to be any signs of anything untoward going on at this point. I think she is done a great job applying the [employer] and I think that has done a great job for the patient is for soften up some of the necrotic tissue. With that being said I think the Xeroform on the arm also has done excellent. In general I am very pleased with where we  stand. And I told the patient's daughter-in- law as well that I also feel like she has done a great job over the past week taking care of her mother. 11/11/2020 upon evaluation today patient appears to be doing well with regard to her wounds. She has been tolerating the dressing changes without complication her daughters been applying the Santyl which has done a great job. Ho with that being said I think now that we have good arterial study showing we can go ahead and proceed with sharp debridement at this point. 11/25/2020 upon evaluation today patient appears to be doing well with regard to her wounds. The Santyl has really helped to clean things up and overall she is doing quite excellent at this point. Fortunately there does not appear to be any signs of infection which is great news and in general extremely pleased. I do believe some debridement is in order and hopefully will be able to get her into a collagen dressing after this. 12/23/2020 upon evaluation today patient appears to be doing well with regard to her wound all things considered. Unfortunately it does sound like her daughter decided to let this air out because she felt like it was getting so wet. It sounds like she got border gauze dressings instead of border foam therefore there is really Nothing to catch the excess drainage which I think is the problem they were worried about the smell. Fortunately there does not appear to be any signs of active infection which is great news. Nonetheless I do not think we want to leave this just open to air. 01/13/2021 upon evaluation today patient's wounds actually appear to be about as good as have seen since have been taking care of her. Fortunately there does not appear to be any evidence of infection which is great and overall the biggest issue I see is that of fluid buildup. I think we need to do something to try to help manage this. I think if we can control her edema we get the wounds to heal  more effectively. 01/27/2021 upon evaluation today patient appears to be doing  well in regard to the wounds on her right lateral malleolus as well as the left heel and the right ischial tuberosity is unfortunately a new area that has arisen since we last saw her. She tells me currently that that is from sitting too much. With that being said this actually appears to be doing the worst of anything so far that I see today. Fortunately there does not appear to be any signs of infection this is at least good news. Compression wrap seem to be doing excellent for her as far as the legs are concerned. 02/03/2021 upon evaluation today patient appears to be doing well with regard to all of her wounds. She has been tolerating the dressing changes without complication. Fortunately there is no signs of active infection at this time. No fevers, chills, nausea, vomiting, or diarrhea. 02/10/2021 upon evaluation today patient appears to be doing well with regard to her wounds. With that being said there is some slight evidence of hypergranulation in regard to the right ankle in particular and a little bit in regard to the left heel. I think Hydrofera Blue might be a better option to go to at this point based on what I am seeing especially since both of these wounds in particular seem to be a little bit stalled. I discussed that with the patient and her daughter today. With regard to the hip this is doing great with the collagen I think will get very close to complete closure I recommend we continue with the collagen. 02/17/2021 upon evaluation today patient appears to be doing excellent in regard to her heel ulcers. She has been tolerating the dressing changes without complication and overall I am extremely pleased with where things stand today. There does not appear to be any signs of active infection which is great news. Overall I may think that we are headed in the proper direction based on what I am seeing. 02/24/2021  upon evaluation today patient's wounds actually are showing signs of improvement in regard to her heels both are doing awesome. In [place], [place] ([id number]) regard to her hip location this is actually reopened after being closed last week and to be perfectly honest I am more concerned here about the fact that pressures causing this issue I do not think it has anything to do with her not having the collagen in place again it was completely healed last week there was no reason for the collagen to be there. 03/03/2021 upon evaluation today patient appears to be doing well with regard to her wounds. Everything is showing signs of improvement and doing some much better as far as the overall size of the wound. Fortunately I think we are headed in the right direction. 03/10/2021 upon evaluation today patient appears to be doing well with regard to her wound. She is tolerating the dressing changes without complication. The left heel pretty much appears to be almost completely healed although I think it is probably can be 1 more week before I can call this 100% slough. The right ankle is significantly improved with that being said its not completely closed. With regard to the right hip this is completely closed. Overall I am very pleased with where things stand I think were getting very close to complete resolution. I discussed all this with the patient and her daughter-in-law today. 03/17/2021 upon evaluation today patient appears to be doing well with regard to her wounds. Fortunately there does not appear to be any signs of active infection  which is great news and overall very pleased with where the patient stands today. I am in general thankful that overall she is making great progress here. There is good to be a little bit of debridement here to clear away some of the necrotic debris today on both wound locations. 03/24/2021 upon evaluation today patient appears to be doing excellent in regard to her wound.  She has been tolerating the dressing changes without complication. Fortunately there does not appear to be any evidence of infection the left foot is completely healed this is great news. On the right at the ankle region this is still open though I do not think the alginate did quite as well as I was hoping as far as getting this dried out. I think that we may just want to switch back to the [facility] which has done well up to this point. I was just hopeful this would wrap things up completely for [place]. 03/31/2021 upon evaluation today patient appears to be doing well with regard to her wound. This is on the right side which appears to be doing better than last week I think is definitely measuring smaller and this is great news. Overall I am very pleased with where we stand today. I do believe the [facility] is doing better for her. 04/14/2021 upon evaluation today patient appears to be doing well with regard to her wound. I am very pleased with the way it stands I think that she is headed in the right direction. Unfortunately she is having issues with the Tubigrip neither she nor her daughter-in-law who is the primary caregiver can get this on. Subsequently that means that they have been having a very difficult time with complying with the necessary things for this left leg. With that being said I am really not sure what to do to help in that regard. We can always try a bigger that is larger size Tubigrip but at the same time that means that she may not get as good compression and it may not keep things under control the only way to know is to try. 04/21/2021 upon evaluation today patient appears to be doing well in regard to her wound. She has been tolerating the dressing changes without complication and in fact the right ankle ulcer is actually showing signs of excellent improvement I am very pleased with where things stand today. No fevers, chills, nausea, vomiting, or diarrhea. 04/28/2021  upon evaluation today patient appears to be doing well with regard to her ankle ulcer. This is actually showing signs of good improvement which is great news and overall very pleased with where we stand today. No fevers, chills, nausea, vomiting, or diarrhea. 05/05/2021 upon evaluation today patient's wound is actually showing signs of significant improvement and overall I am extremely pleased with where we stand today. There does not appear to be any signs of active infection which is great news and overall I am extremely pleased with where we stand at this point. No fevers, chills, nausea, vomiting, or diarrhea. 05/12/2021 upon evaluation today patient appears to be doing okay in regard to her wound is measuring a little bit smaller today that is good news. With that being said she still continues to have significant issues here with an open wound on the lateral aspect of her ankle. Fortunately there does not appear to be any signs of active infection systemically which is great news. No fevers, chills, nausea, vomiting, or diarrhea. 06/13/2021 upon evaluation today patient unfortunately  continues to have issues with her right leg. Specifically the ankle region where there is an open wound. Previously the wound on the left leg had completely closed. Nonetheless this left heel has reopened as well. I am going to perform some debridement in regard to the heel ulcer appears to be a fluid-filled blister underneath this area. She has not been here for such a long time due to the fact that she was very sick and they were not able to bring her out. That is the reason we have not seen her in over the past month. 06/20/2021 upon evaluation patient's wounds currently are doing well on the left heel this appears to be healed. There does not appear to be any signs of drainage at this time which is great news. No fevers, chills, nausea, vomiting, or diarrhea. On the right heel there is still an open wound but I  feel like we are showing some signs of improvement this is still good to take a bit of time. 06/27/2021 upon evaluation today patient appears to be doing better in regard to her wound. This is actually showing signs of good improvement which is great news. Fortunately I do not see any signs of infection currently which is great and overall we are finally seeing some actual decrease in the size I think that the compression wraps are making a big difference here. 12/27 some debris on the surface. No evidence of surrounding infection we have been using collagen 07/21/2021 upon evaluation today patient appears to be doing well with regard to her wound. She has been tolerating the dressing changes without complication. Fortunately I do not see any evidence of active infection locally nor systemically at this time which is great news and overall I think the wound making excellent progress here. 08/04/2021 upon evaluation today patient's wound is showing signs of being a little bit moist and really not significantly smaller. Fortunately I do not see any signs of active infection locally nor systemically at this time that is good news. Nonetheless I do feel like she is doing quite well otherwise and her left leg is still doing awesome. 08/11/2021 upon evaluation today patient appears to be doing well with regard to her wound. She has been tolerating the dressing changes without complication. Fortunately there does not appear to be any signs of active infection at this time. The wound appears to be less macerated though I do believe she was moistening the alginate just a little bit I told her she does not need to do that when putting on a longer she voiced understanding and will not do so any longer. SHALEEN, TALAMANTEZ ([id number]) Objective Constitutional Well-nourished and well-hydrated in no acute distress. Vitals Time Taken: 3:05 PM, Height: 66 in, Weight: 76 lbs, BMI: 12.3, Temperature: 98 F, Pulse: 67  bpm, Respiratory Rate: 16 breaths/min, Blood Pressure: 187/84 mmHg. Respiratory normal breathing without difficulty. Psychiatric this patient is able to make decisions and demonstrates good insight into disease process. Alert and Oriented x 3. pleasant and cooperative. General Notes: Upon inspection patient's wound bed actually showed signs of good granulation and epithelization at this point. Fortunately I do not see any signs of infection locally or systemically which is great news and overall very pleased with where we stand currently. Integumentary (Hair, Skin) Wound #1 status is Open. Original cause of wound was Gradually Appeared. The date acquired was: 10/22/2014. The wound has been in treatment 42 weeks. The wound is located on the Right,Lateral Malleolus. The  wound measures 1cm length x 1.2cm width x 0.2cm depth; 0.942cm^2 area and 0.188cm^3 volume. There is Fat Layer (Subcutaneous Tissue) exposed. There is a large amount of serosanguineous drainage noted. The wound margin is flat and intact. There is small (1-33%) red granulation within the wound bed. There is a large (67-100%) amount of necrotic tissue within the wound bed including Adherent Slough. Assessment Active Problems ICD-10 Pressure ulcer of right ankle, stage 3 Other specified peripheral vascular diseases Pressure ulcer of left heel, stage 3 Essential (primary) hypertension Atherosclerotic heart disease of native coronary artery without angina pectoris Plan Follow-up Appointments: Return Appointment in 1 week. Nurse Visit as needed Bathing/ Shower/ Hygiene: May shower with wound dressing protected with water repellent cover or cast protector. No tub bath. Anesthetic (Use 'Patient Medications' Section for Anesthetic Order Entry): Lidocaine applied to wound bed Edema Control - Lymphedema / Segmental Compressive Device / Other: Elevate legs to the level of the heart and pump ankles as often as possible Elevate leg(s)  parallel to the floor when sitting. DO YOUR BEST to sleep in the bed at night. DO NOT sleep in your recliner. Long hours of sitting in a recliner leads to swelling of the legs and/or potential wounds on your backside. Additional Orders / Instructions: Follow Nutritious Diet and Increase Protein Intake WOUND #1: - Malleolus Wound Laterality: Right, Lateral Cleanser: Soap and Water Every Other Day/15 Days Discharge Instructions: Gently cleanse wound with antibacterial soap, rinse and pat dry prior to dressing wounds Primary Dressing: Silvercel Small 2x2 (in/in) Every Other Day/15 Days Discharge Instructions: Apply Silvercel Small 2x2 (in/in) as instructed Secondary Dressing: Gauze Every Other Day/15 Days Discharge Instructions: small dry gauze under BF Secondary Dressing: Zetuvit Plus Silicone Border Dressing 4x4 (in/in) Every Other Day/15 Days Secured With: Tubigrip Size C, 2.75x10 (in/yd) Every Other Day/15 Days Discharge Instructions: Apply 3 Tubigrip C 3-finger-widths below knee to base of toes to secure dressing and/or for swelling. VIDHI, DELELLIS ([id number]) 1. Would recommend him going to continue with the wound care measures as before and the patient is in agreement the plan. This includes the use of the silver cell dressing which I think is doing well. 2. I am also can recommend that we continue with the Zetuvit border foam dressing which is also doing well. 3. I would also suggest that the patient continue to elevate her legs much as possible her daughter in law as well decided to look for some compression socks that might fit her although elastic therapy did not have anything small enough to fit her leg. We will see patient back for reevaluation in 1 week here in the clinic. If anything worsens or changes patient will contact our office for additional recommendations. Electronic Signature(s) Signed: 08/11/2021 3:28:57 PM By: Worthy Keeler PA-C Entered By: Worthy Keeler on  08/11/2021 15:28:57 [name] ([id number]) -------------------------------------------------------------------------------- SuperBill Details Patient Name: NAMINE, BEAHM Date of Service: 08/11/2021 Medical Record Number: [id number] Patient Account Number: [id number] Date of Birth/Sex: [birth year] (88 y.o. F) Treating RN: Donnamarie Poag Primary Care Provider: Viviana Simpler Other Clinician: Referring Provider: Viviana Simpler Treating Provider/Extender: Skipper Cliche in Treatment: 42 Diagnosis Coding ICD-10 Codes Code Description [phone] Pressure ulcer of right ankle, stage 3 I73.89 Other specified peripheral vascular diseases L89.623 Pressure ulcer of left heel, stage 3 I10 Essential (primary) hypertension I25.10 Atherosclerotic heart disease of native coronary artery without angina pectoris Facility Procedures CPT4 Code: [id number] Description: 99213 - WOUND CARE VISIT-LEV 3 EST PT Modifier: Quantity:  1 Physician Procedures CPT4 Code: [id number] Description: 47395 - WC PHYS LEVEL 3 - EST PT Modifier: Quantity: 1 CPT4 Code: Description: ICD-10 Diagnosis Description L89.513 Pressure ulcer of right ankle, stage 3 I73.89 Other specified peripheral vascular diseases L89.623 Pressure ulcer of left heel, stage 3 I10 Essential (primary) hypertension Modifier: Quantity: Electronic Signature(s) Signed: 08/15/2021 9:24:02 AM By: Gretta Cool, BSN, RN, CWS, Kim RN, BSN Previous Signature: 08/11/2021 3:29:09 PM Version By: Worthy Keeler PA-C Entered By: Gretta Cool, BSN, RN, CWS, Kim on 08/15/2021 09:24:02

## 2021-08-12 ENCOUNTER — Other Ambulatory Visit: Payer: Self-pay | Admitting: Internal Medicine

## 2021-08-14 ENCOUNTER — Other Ambulatory Visit: Payer: Self-pay | Admitting: Internal Medicine

## 2021-08-14 NOTE — Telephone Encounter (Signed)
Lorazepam last filled 06-06-21 #90  Name of Medication: [place] Name of Pharmacy: [place] or Written Date and Quantity: 07-11-21 #90 Last Office Visit and Type: 01-16-21 Next Office Visit and Type: 08-17-21 Last Controlled Substance Agreement Date: 09-29-21 Last UDS: 04-27-19

## 2021-08-17 ENCOUNTER — Ambulatory Visit (INDEPENDENT_AMBULATORY_CARE_PROVIDER_SITE_OTHER): Payer: PPO | Admitting: Internal Medicine

## 2021-08-17 ENCOUNTER — Encounter: Payer: Self-pay | Admitting: Internal Medicine

## 2021-08-17 ENCOUNTER — Other Ambulatory Visit: Payer: Self-pay

## 2021-08-17 VITALS — BP 122/78 | HR 78 | Temp 98.1°F | Ht 65.0 in | Wt 83.0 lb

## 2021-08-17 DIAGNOSIS — F1320 Sedative, hypnotic or anxiolytic dependence, uncomplicated: Secondary | ICD-10-CM

## 2021-08-17 DIAGNOSIS — F39 Unspecified mood [affective] disorder: Secondary | ICD-10-CM

## 2021-08-17 DIAGNOSIS — F1120 Opioid dependence, uncomplicated: Secondary | ICD-10-CM

## 2021-08-17 DIAGNOSIS — I739 Peripheral vascular disease, unspecified: Secondary | ICD-10-CM

## 2021-08-17 DIAGNOSIS — K2100 Gastro-esophageal reflux disease with esophagitis, without bleeding: Secondary | ICD-10-CM

## 2021-08-17 DIAGNOSIS — E441 Mild protein-calorie malnutrition: Secondary | ICD-10-CM | POA: Diagnosis not present

## 2021-08-17 DIAGNOSIS — Z0000 Encounter for general adult medical examination without abnormal findings: Secondary | ICD-10-CM | POA: Diagnosis not present

## 2021-08-17 DIAGNOSIS — B0229 Other postherpetic nervous system involvement: Secondary | ICD-10-CM | POA: Diagnosis not present

## 2021-08-17 DIAGNOSIS — G629 Polyneuropathy, unspecified: Secondary | ICD-10-CM

## 2021-08-17 DIAGNOSIS — I10 Essential (primary) hypertension: Secondary | ICD-10-CM

## 2021-08-17 MED ORDER — ACYCLOVIR 400 MG PO TABS: 400.0000 mg | ORAL_TABLET | Freq: Three times a day (TID) | ORAL | 11 refills | Status: DC

## 2021-08-17 NOTE — Assessment & Plan Note (Signed)
PDMP reviewed.

## 2021-08-17 NOTE — Assessment & Plan Note (Signed)
I have personally reviewed the Medicare Annual Wellness questionnaire and have noted 1. The patient's medical and social history 2. Their use of alcohol, tobacco or illicit drugs 3. Their current medications and supplements 4. The patient's functional ability including ADL's, fall risks, home safety risks and hearing or visual             impairment. 5. Diet and physical activities 6. Evidence for depression or mood disorders  The patients weight, height, BMI and visual acuity have been recorded in the chart I have made referrals, counseling and provided education to the patient based review of the above and I have provided the pt with a written personalized care plan for preventive services.  I have provided you with a copy of your personalized plan for preventive services. Please take the time to review along with your updated medication list.  Flu vaccine in the fall No cancer screening due to age Unable to really exercise

## 2021-08-17 NOTE — Assessment & Plan Note (Signed)
Controlled with protonix 40 bid

## 2021-08-17 NOTE — Assessment & Plan Note (Signed)
Maintaining weight now over 80#

## 2021-08-17 NOTE — Assessment & Plan Note (Signed)
Vascular ulcer on right --but no claudication No action on this

## 2021-08-17 NOTE — Assessment & Plan Note (Addendum)
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

## 2021-08-17 NOTE — Progress Notes (Signed)
Hearing Screening - Comments:: Did not pass whisper test Vision Screening - Comments:: Appt at end of month

## 2021-08-17 NOTE — Progress Notes (Signed)
Subjective:    Patient ID: [name], female    DOB: Nov 26, 1927, 88 y.o.   MRN: [id number]  HPI Here with DIL for Medicare wellness and follow up of chronic health conditions Reviewed advanced directives Reviewed other doctors--- [facility], Dr Maryellen Pile, Dr Dingledein--ophthal, Dr Lupita Leash, Dr Lester [place] No hospitalizations or surgery in the past year Had respiratory infection in NOvember--may have been COVID No alcohol or tobacco No falls--walks with walker No depression or anhedonia Vision in left eye is blurry--none on right Hearing is not great---no aides Son/DIL shop and do her instrumental ADLs. She does her own sponge bath. No incontinence Not able to exercise Mild memory issues  Doing okay Pain control okay with the prn morphine Some headache--topiramate is helpful (and for the neuropathy)  Ongoing right ankle ulcer Almost healed now---has gone back about a year  Ongoing anxiety --mostly related to pain Lorazepam helps her sleep  Legs do hurt with walking No recent vascular visits---had gone due to the ulcer  Appetite is pretty good Maintaining weight over 80#  Current Outpatient Medications on File Prior to Visit  Medication Sig Dispense Refill   acyclovir (ZOVIRAX) 400 MG tablet TAKE 1 TABLET BY MOUTH 4 TIMES DAILY (Patient taking differently: 400 mg 3 (three) times daily.) 120 tablet 11   azelastine (ASTELIN) 0.1 % nasal spray Place 2 sprays into both nostrils 2 (two) times daily.     fexofenadine (ALLEGRA) 180 MG tablet Take 180 mg by mouth daily.     LORazepam (ATIVAN) 1 MG tablet TAKE 1/2 TO 1 TABLET BY MOUTH TWICE DAILY AS NEEDED FOR ANXIETY (Patient taking differently: Take 1 mg by mouth at bedtime. TAKE 1/2 TO 1 TABLET BY MOUTH TWICE DAILY AS NEEDED FOR ANXIETY) 60 tablet 0   morphine (MSIR) 15 MG tablet TAKE 1 TABLET BY MOUTH 3 TIMES DAILY AS NEEDED FOR PAIN 90 tablet 0   Multiple Vitamin (MULTIVITAMIN) tablet Take 1  tablet by mouth once daily     pantoprazole (PROTONIX) 40 MG tablet TAKE 1 TABLET BY MOUTH TWICE A DAY 180 tablet 3   polyethylene glycol (MIRALAX / GLYCOLAX) packet Take 17 g by mouth at bedtime.     RESTASIS 0.05 % ophthalmic emulsion Place 1 drop into both eyes 2 (two) times daily.     topiramate (TOPAMAX) 50 MG tablet Take 1 tablet (50 mg total) by mouth at bedtime. 30 tablet 11   No current facility-administered medications on file prior to visit.    Allergies  Allergen Reactions   Duloxetine     REACTION: N/V Mental status change and trouble with balance   Cymbalta [Duloxetine Hcl]     Unsure of reaction   Maxitrol [Neomycin-Polymyxin-Dexameth]     Unsure of reaction   Pregabalin     REACTION: SOB, swollen lips   Tobradex [Tobramycin-Dexamethasone]     Unsure of reaction    Past Medical History:  Diagnosis Date   Arrhythmia    Arthritis    Atypical chest pain    Lexiscan myoview (5/11) with EF 84%, normal wall motion, small fixed apical  perfusion defect likely due to breast attenuation, no evidence for ischemia or infarction.  **Patient had an   Basal cell carcinoma 07/28/19 [facility]   R pretibial   Breast cancer ([place])    Bilateral mastectomies 1986.   CVA (cerebral infarction) 10/12   right lacunar   Depression    Dyspnea    Echo (5/11) was a difficult study due  to breast implants but showed normal LV and RV size and systolic function.       GERD (gastroesophageal reflux disease)    Headache    s/p shingles - right side of head   History of partial thyroidectomy    Hyperlipidemia    Hypertension    in past - no current meds/issues   Obstruction of intestine ([facility])    Partial small bowel obstruction ([place]) 7/14   no surgery   Superficial basal cell carcinoma 06/18/2019   R mid to distal pretibial   Wears dentures    full upper   Zoster     with Post-herpetic neuralgia    Past Surgical History:  Procedure Laterality Date   ABDOMINAL HYSTERECTOMY     BREAST  SURGERY Bilateral    cancer - mastectomy and implant insertion   INTRAMEDULLARY (IM) NAIL INTERTROCHANTERIC Left 06/19/2020   Procedure: INTRAMEDULLARY (IM) NAIL INTERTROCHANTRIC;  Surgeon: Earnestine Leys, MD;  Location: [facility];  Service: Orthopedics;  Laterality: Left;   MASTECTOMY  1980   bilateral   NECK SURGERY     TARSORRHAPHY Bilateral 08/16/2015   Procedure: MINOR TARSORRAPHY LATERAL PLACEMENT;  Surgeon: Karle Starch, MD;  Location: [facility];  Service: Ophthalmology;  Laterality: Bilateral;   VAGINAL DELIVERY      Family History  Problem Relation Age of Onset   Heart disease Son        open heart surgery for a blockage   Heart Problems Son    Diabetes Son    Parkinson's disease Son     Social History   Socioeconomic History   Marital status: Widowed    Spouse name: Not on file   Number of children: 2   Years of education: Not on file   Highest education level: Not on file  Occupational History   Occupation: Museum/gallery exhibitions officer: RETIRED  Tobacco Use   Smoking status: Former   Smokeless tobacco: Never  Substance and Sexual Activity   Alcohol use: No    Alcohol/week: 0.0 standard drinks   Drug use: No   Sexual activity: Not Currently  Other Topics Concern   Not on file  Social History Narrative   No living will   No health care POA but requests daughter-in-law [name] to do this   Would like attempts at resuscitation   No feeding tube if cognitively unaware   Social Determinants of Health   Financial Resource Strain: Not on file  Food Insecurity: Not on file  Transportation Needs: Not on file  Physical Activity: Not on file  Stress: Not on file  Social Connections: Not on file  Intimate Partner Violence: Not on file   Review of Systems Sleeps okay Wears seat belt No teeth---doesn't wear dentures No heartburn or dysphagia with bid pantoprazole Bowels move fine Some knee pain--no meds for this No suspicious skin lesions    Objective:    Physical Exam Constitutional:      Comments: Same mild wasting  HENT:     Mouth/Throat:     Comments: No lesions Eyes:     Comments: Right eye fused shut  Cardiovascular:     Rate and Rhythm: Normal rate and regular rhythm.     Heart sounds: No murmur heard.   No gallop.     Comments: No pulses Pulmonary:     Effort: Pulmonary effort is normal.     Breath sounds: Normal breath sounds. No wheezing or rales.  Abdominal:  Palpations: Abdomen is soft.     Tenderness: There is no abdominal tenderness.  Musculoskeletal:     Right lower leg: No edema.     Left lower leg: No edema.  Skin:    Findings: No rash.     Comments: Small ulcer right lateral malleolus  Neurological:     Mental Status: She is alert.     Comments: Mini cog okay----clock tough due to vision problems but okay. Memory 2+/3 (had trouble with initial memory)  Psychiatric:        Mood and Affect: Mood normal.        Behavior: Behavior normal.           Assessment & Plan:

## 2021-08-17 NOTE — Assessment & Plan Note (Signed)
PDMP reviewed No concerns 

## 2021-08-17 NOTE — Assessment & Plan Note (Signed)
Uses topiramate 50 at bedtime for this

## 2021-08-17 NOTE — Assessment & Plan Note (Signed)
No depression now On lorazepam for anxiety --for sleep (mostly related to chronic pain)

## 2021-08-17 NOTE — Assessment & Plan Note (Signed)
BP Readings from Last 3 Encounters:  08/17/21 122/78  05/21/21 (!) 177/59  05/20/21 (!) 176/92   Controlled without meds at this point

## 2021-08-18 ENCOUNTER — Encounter: Payer: PPO | Admitting: Physician Assistant

## 2021-08-18 DIAGNOSIS — L97312 Non-pressure chronic ulcer of right ankle with fat layer exposed: Secondary | ICD-10-CM | POA: Diagnosis not present

## 2021-08-18 DIAGNOSIS — L89513 Pressure ulcer of right ankle, stage 3: Secondary | ICD-10-CM | POA: Diagnosis not present

## 2021-08-18 DIAGNOSIS — I7389 Other specified peripheral vascular diseases: Secondary | ICD-10-CM | POA: Diagnosis not present

## 2021-08-18 DIAGNOSIS — L89623 Pressure ulcer of left heel, stage 3: Secondary | ICD-10-CM | POA: Diagnosis not present

## 2021-08-18 DIAGNOSIS — I70233 Atherosclerosis of native arteries of right leg with ulceration of ankle: Secondary | ICD-10-CM | POA: Diagnosis not present

## 2021-08-18 NOTE — Progress Notes (Addendum)
[name], [name] ([id number]) Visit Report for 08/18/2021 Arrival Information Details Patient Name: [name], [name] Date of Service: 08/18/2021 3:15 PM Medical Record Number: [id number] Patient Account Number: [id number] Date of Birth/Sex: [birth year] (88 y.o. F) Treating RN: [name] Primary Care [name]: [name] Other Clinician: Referring [name]: [name] Treating [name]/Extender: [name] in Treatment: 39 Visit Information History Since Last Visit Added or deleted any medications: No Patient Arrived: Walker Pain Present Now: Yes Arrival Time: 15:43 Accompanied By: self Transfer Assistance: None Patient Identification Verified: Yes Secondary Verification Process Completed: Yes Patient Requires Transmission-Based Precautions: No Patient Has Alerts: Yes Patient Alerts: NOT DIABETIC TBI left .77 TBI right .76 Electronic Signature(s) Signed: 08/18/2021 4:52:49 PM By: [name], BSN, RN, CWS, Kim RN, BSN Entered By: [name], BSN, RN, CWS, Kim on 08/18/2021 15:44:13 Downs, [name] ([id number]) -------------------------------------------------------------------------------- Clinic Level of Care Assessment Details Patient Name: [name], [name] Date of Service: 08/18/2021 3:15 PM Medical Record Number: [id number] Patient Account Number: [id number] Date of Birth/Sex: [birth year] (88 y.o. F) Treating RN: [name] Primary Care Jakyrie Totherow: [name] Other Clinician: Referring Briena Swingler: [name] Treating Donetta Isaza/Extender: [name] in Treatment: 27 Clinic Level of Care Assessment Items TOOL 4 Quantity Score '[]'  - Use when only an EandM is performed on FOLLOW-UP visit 0 ASSESSMENTS - Nursing Assessment / Reassessment X - Reassessment of Co-morbidities (includes updates in patient status) 1 10 X- 1 5 Reassessment of Adherence to Treatment Plan ASSESSMENTS - Wound and Skin Assessment / Reassessment X - Simple Wound Assessment / Reassessment -  one wound 1 5 '[]'  - 0 Complex Wound Assessment / Reassessment - multiple wounds '[]'  - 0 Dermatologic / Skin Assessment (not related to wound area) ASSESSMENTS - Focused Assessment '[]'  - Circumferential Edema Measurements - multi extremities 0 '[]'  - 0 Nutritional Assessment / Counseling / Intervention '[]'  - 0 Lower Extremity Assessment (monofilament, tuning fork, pulses) '[]'  - 0 Peripheral Arterial Disease Assessment (using hand held doppler) ASSESSMENTS - Ostomy and/or Continence Assessment and Care '[]'  - Incontinence Assessment and Management 0 '[]'  - 0 Ostomy Care Assessment and Management (repouching, etc.) PROCESS - Coordination of Care X - Simple Patient / Family Education for ongoing care 1 15 '[]'  - 0 Complex (extensive) Patient / Family Education for ongoing care X- 1 10 Staff obtains Consents, Records, Test Results / Process Orders '[]'  - 0 Staff telephones HHA, Nursing Homes / Clarify orders / etc '[]'  - 0 Routine Transfer to another Facility (non-emergent condition) '[]'  - 0 Routine Hospital Admission (non-emergent condition) '[]'  - 0 New Admissions / Biomedical engineer / Ordering NPWT, Apligraf, etc. '[]'  - 0 Emergency Hospital Admission (emergent condition) X- 1 10 Simple Discharge Coordination '[]'  - 0 Complex (extensive) Discharge Coordination PROCESS - Special Needs '[]'  - Pediatric / Minor Patient Management 0 '[]'  - 0 Isolation Patient Management '[]'  - 0 Hearing / Language / Visual special needs '[]'  - 0 Assessment of Community assistance (transportation, D/C planning, etc.) '[]'  - 0 Additional assistance / Altered mentation '[]'  - 0 Support Surface(s) Assessment (bed, cushion, seat, etc.) INTERVENTIONS - Wound Cleansing / Measurement Elizabeth, Downs ([id number]) X- 1 5 Simple Wound Cleansing - one wound '[]'  - 0 Complex Wound Cleansing - multiple wounds X- 1 5 Wound Imaging (photographs - any number of wounds) '[]'  - 0 Wound Tracing (instead of photographs) X- 1  5 Simple Wound Measurement - one wound '[]'  - 0 Complex Wound Measurement - multiple wounds INTERVENTIONS - Wound Dressings '[]'  - Small Wound Dressing  one or multiple wounds 0 X- 1 15 Medium Wound Dressing one or multiple wounds '[]'  - 0 Large Wound Dressing one or multiple wounds '[]'  - 0 Application of Medications - topical '[]'  - 0 Application of Medications - injection INTERVENTIONS - Miscellaneous '[]'  - External ear exam 0 '[]'  - 0 Specimen Collection (cultures, biopsies, blood, body fluids, etc.) '[]'  - 0 Specimen(s) / Culture(s) sent or taken to Lab for analysis '[]'  - 0 Patient Transfer (multiple staff / Civil Service fast streamer / Similar devices) '[]'  - 0 Simple Staple / Suture removal (25 or less) '[]'  - 0 Complex Staple / Suture removal (26 or more) '[]'  - 0 Hypo / Hyperglycemic Management (close monitor of Blood Glucose) '[]'  - 0 Ankle / Brachial Index (ABI) - do not check if billed separately X- 1 5 Vital Signs Has the patient been seen at the hospital within the last three years: Yes Total Score: 90 Level Of Care: New/Established - Level 3 Electronic Signature(s) Signed: 08/18/2021 4:52:49 PM By: [name], BSN, RN, CWS, Kim RN, BSN Entered By: [name], BSN, RN, CWS, Kim on 08/18/2021 16:49:27 [name], [name] ([id number]) -------------------------------------------------------------------------------- Encounter Discharge Information Details Patient Name: [name], [name] Date of Service: 08/18/2021 3:15 PM Medical Record Number: [id number] Patient Account Number: [id number] Date of Birth/Sex: 1928-05-25 (88 y.o. F) Treating RN: [name] Primary Care Jakayden Cancio: [name] Other Clinician: Referring Moyses Pavey: [name] Treating [name]/Extender: [name] in Treatment: 71 Encounter Discharge Information Items Discharge Condition: Stable Ambulatory Status: Walker Discharge Destination: Home Transportation: Private Auto Accompanied By: daughter Schedule Follow-up Appointment:  Yes Clinical Summary of Care: Electronic Signature(s) Signed: 08/18/2021 4:51:21 PM By: [name], BSN, RN, CWS, Kim RN, BSN Entered By: [name], BSN, RN, CWS, Kim on 08/18/2021 16:51:21 [name] ([id number]) -------------------------------------------------------------------------------- Lower Extremity Assessment Details Patient Name: [name], [name] Date of Service: 08/18/2021 3:15 PM Medical Record Number: [id number] Patient Account Number: [id number] Date of Birth/Sex: [birth year] (88 y.o. F) Treating RN: [name] Primary Care Shatera Rennert: [name] Other Clinician: Referring Neala Miggins: [name] Treating Shanyce Daris/Extender: [name] in Treatment: 43 Edema Assessment Assessed: [Left: No] [Right: No] Edema: [Left: Ye] [Right: s] Calf Left: Right: Point of Measurement: 33 cm From Medial Instep 24.5 cm Ankle Left: Right: Point of Measurement: 9 cm From Medial Instep 20 cm Vascular Assessment Pulses: Dorsalis Pedis Palpable: [Right:Yes] Electronic Signature(s) Signed: 08/18/2021 4:52:49 PM By: [name], BSN, RN, CWS, Kim RN, BSN Entered By: [name], BSN, RN, CWS, Kim on 08/18/2021 15:54:34 Boan, [name] ([id number]) -------------------------------------------------------------------------------- Multi Wound Chart Details Patient Name: [name], [name] Date of Service: 08/18/2021 3:15 PM Medical Record Number: [id number] Patient Account Number: [id number] Date of Birth/Sex: 1928/05/31 (88 y.o. F) Treating RN: [name] Primary Care Sharday Michl: [name] Other Clinician: Referring Benyamin Jeff: [name] Treating Riggs Dineen/Extender: [name] in Treatment: 27 Vital Signs Height(in): 66 Pulse(bpm): 2 Weight(lbs): 2 Blood Pressure(mmHg): 182/74 Body Mass Index(BMI): 12.3 Temperature(F): 98.2 Respiratory Rate(breaths/min): 16 Photos: [N/A:N/A] Wound Location: Right, Lateral Malleolus Left, Medial Calcaneus N/A Wounding Event: Gradually  Appeared Blister N/A Primary Etiology: Pressure Ulcer Abrasion N/A Comorbid History: Arrhythmia, Coronary Artery Arrhythmia, Coronary Artery N/A Disease, Hypertension, Peripheral Disease, Hypertension, Peripheral Arterial Disease, History of pressure Arterial Disease, History of pressure wounds, Osteoarthritis wounds, Osteoarthritis Date Acquired: 10/22/2014 08/18/2021 N/A Weeks of Treatment: 43 0 N/A Wound Status: Open Open N/A Wound Recurrence: No No N/A Measurements L x W x D (cm) 1.2x1.6x0.2 1.2x1.2x0.1 N/A Area (cm) : 1.508 1.131 N/A Volume (cm) : 0.302 0.113 N/A %  Reduction in Area: -92.10% 0.00% N/A % Reduction in Volume: -282.30% 0.00% N/A Classification: Category/Stage III Unclassifiable N/A Exudate Amount: Large N/A N/A Exudate Type: Serosanguineous N/A N/A Exudate Color: red, brown N/A N/A Wound Margin: Flat and Intact N/A N/A Granulation Amount: None Present (0%) N/A N/A Necrotic Amount: Large (67-100%) N/A N/A Exposed Structures: Fat Layer (Subcutaneous Tissue): Fascia: No N/A Yes Fat Layer (Subcutaneous Tissue): Fascia: No No Tendon: No Tendon: No Muscle: No Muscle: No Joint: No Joint: No Bone: No Bone: No Limited to Skin Breakdown Epithelialization: None N/A N/A Treatment Notes Electronic Signature(s) Signed: 08/18/2021 4:37:23 PM By: [name], BSN, RN, CWS, Kim RN, BSN Entered By: [name], BSN, RN, CWS, Kim on 08/18/2021 16:37:23 Gatchell, [name] ([id number]) -------------------------------------------------------------------------------- [place] Details Patient Name: [name], MUNCE. Date of Service: 08/18/2021 3:15 PM Medical Record Number: [id number] Patient Account Number: [id number] Date of Birth/Sex: Feb 03, 1928 (88 y.o. F) Treating RN: [name] Primary Care Patte Winkel: [name] Other Clinician: Referring Kurtis Anastasia: [name] Treating Dannell Raczkowski/Extender: [name] in Treatment: 5 Active Inactive Wound/Skin  Impairment Nursing Diagnoses: Impaired tissue integrity Goals: Patient/caregiver will verbalize understanding of skin care regimen Date Initiated: 10/21/2020 Date Inactivated: 12/23/2020 Target Resolution Date: 12/21/2020 Goal Status: Met Ulcer/skin breakdown will have a volume reduction of 30% by week 4 Date Initiated: 10/21/2020 Date Inactivated: 11/25/2020 Target Resolution Date: 11/20/2020 Goal Status: Met Ulcer/skin breakdown will have a volume reduction of 50% by week 8 Date Initiated: 10/21/2020 Date Inactivated: 12/23/2020 Target Resolution Date: 12/21/2020 Goal Status: Met Ulcer/skin breakdown will have a volume reduction of 80% by week 12 Date Initiated: 10/21/2020 Date Inactivated: 02/17/2021 Target Resolution Date: 01/20/2021 Goal Status: Met Ulcer/skin breakdown will heal within 14 weeks Date Initiated: 10/21/2020 Target Resolution Date: 09/05/2021 Goal Status: Active Interventions: Assess patient/caregiver ability to obtain necessary supplies Assess patient/caregiver ability to perform ulcer/skin care regimen upon admission and as needed Assess ulceration(s) every visit Notes: Electronic Signature(s) Signed: 08/18/2021 4:52:49 PM By: [name], BSN, RN, CWS, Kim RN, BSN Entered By: [name], BSN, RN, CWS, Kim on 08/18/2021 15:58:13 Veno, [name] ([id number]) -------------------------------------------------------------------------------- Pain Assessment Details Patient Name: [name], [name] Date of Service: 08/18/2021 3:15 PM Medical Record Number: [id number] Patient Account Number: [id number] Date of Birth/Sex: [birth year] (88 y.o. F) Treating RN: [name] Primary Care Najmah Carradine: [name] Other Clinician: Referring Elverda Wendel: [name] Treating Anne Sebring/Extender: [name] in Treatment: 10 Active Problems Location of Pain Severity and Description of Pain Patient Has Paino No Site Locations Pain Management and Medication Current Pain  Management: Electronic Signature(s) Signed: 08/18/2021 4:52:49 PM By: [name], BSN, RN, CWS, Kim RN, BSN Entered By: [name], BSN, RN, CWS, Kim on 08/18/2021 15:47:11 [name], [name] ([id number]) -------------------------------------------------------------------------------- Patient/Caregiver Education Details Patient Name: [name], [name] Date of Service: 08/18/2021 3:15 PM Medical Record Number: [id number] Patient Account Number: [id number] Date of Birth/Gender: 1928-02-14 (88 y.o. F) Treating RN: [name] Primary Care Physician: [name] Other Clinician: Referring Physician: Viviana Downs Treating Physician/Extender: [name] in Treatment: 44 Education Assessment Education Provided To: Patient Education Topics Provided Wound/Skin Impairment: Handouts: Caring for Your Ulcer, Other: continue wound care as prescribed. Methods: Demonstration, Explain/Verbal Responses: State content correctly Electronic Signature(s) Signed: 08/18/2021 4:52:49 PM By: [name], BSN, RN, CWS, Kim RN, BSN Entered By: [name], BSN, RN, CWS, Kim on 08/18/2021 16:50:25 Pajak, [name] ([id number]) -------------------------------------------------------------------------------- Wound Assessment Details Patient Name: [name], [name] Date of Service: 08/18/2021 3:15 PM Medical Record Number: [id number] Patient Account Number: [id number] Date of Birth/Sex: 1927/10/14 (88  y.o. F) Treating RN: [name] Primary Care Everton Bertha: [name] Other Clinician: Referring Teneil Shiller: [name] Treating Jack Bolio/Extender: [name] in Treatment: 43 Wound Status Wound Number: 1 Primary Pressure Ulcer Etiology: Wound Location: Right, Lateral Malleolus Wound Open Wounding Event: Gradually Appeared Status: Date Acquired: 10/22/2014 Comorbid Arrhythmia, Coronary Artery Disease, Hypertension, Weeks Of Treatment: 43 History: Peripheral Arterial Disease, History of pressure wounds, Clustered  Wound: No Osteoarthritis Photos Wound Measurements Length: (cm) 1.2 Width: (cm) 1.6 Depth: (cm) 0.2 Area: (cm) 1.508 Volume: (cm) 0.302 % Reduction in Area: -92.1% % Reduction in Volume: -282.3% Epithelialization: None Tunneling: No Undermining: No Wound Description Classification: Category/Stage III Wound Margin: Flat and Intact Exudate Amount: Large Exudate Type: Serosanguineous Exudate Color: red, brown Foul Odor After Cleansing: No Slough/Fibrino Yes Wound Bed Granulation Amount: None Present (0%) Exposed Structure Necrotic Amount: Large (67-100%) Fascia Exposed: No Necrotic Quality: Adherent Slough Fat Layer (Subcutaneous Tissue) Exposed: Yes Tendon Exposed: No Muscle Exposed: No Joint Exposed: No Bone Exposed: No Treatment Notes Wound #1 (Malleolus) Wound Laterality: Right, Lateral Cleanser Soap and Water Discharge Instruction: Gently cleanse wound with antibacterial soap, rinse and pat dry prior to dressing wounds [name], [name] ([id number]) Peri-Wound Care Topical Primary Dressing Cutimed Sorbact 1.5x 2.38 (in/in) Discharge Instruction: A bacteria- and fungi binding wound dressing, suitable for cavities and fistulas. It is suitable as a wound filler and allows the passage of wound exudate into a secondary dressing. The dressing helps reducing odor and pain and can improve healing. Secondary Dressing Zetuvit Plus Silicone Border Dressing 4x4 (in/in) Secured With Tubigrip Size C, 2.75x10 (in/yd) Discharge Instruction: Apply 3 Tubigrip C 3-finger-widths below knee to base of toes to secure dressing and/or for swelling. Compression Wrap Compression Stockings Add-Ons Electronic Signature(s) Signed: 08/18/2021 4:52:49 PM By: [name], BSN, RN, CWS, Kim RN, BSN Entered By: [name], BSN, RN, CWS, Kim on 08/18/2021 15:51:51 Putt, [name] ([id number]) -------------------------------------------------------------------------------- Wound Assessment  Details Patient Name: [name], [name] Date of Service: 08/18/2021 3:15 PM Medical Record Number: [id number] Patient Account Number: [id number] Date of Birth/Sex: 09/13/1927 (88 y.o. F) Treating RN: [name] Primary Care Magdalyn Arenivas: [name] Other Clinician: Referring Torrez Renfroe: [name] Treating Bernis Stecher/Extender: [name] in Treatment: 43 Wound Status Wound Number: 6 Primary Abrasion Etiology: Wound Location: Left, Medial Calcaneus Wound Open Wounding Event: Blister Status: Date Acquired: 08/18/2021 Comorbid Arrhythmia, Coronary Artery Disease, Hypertension, Weeks Of Treatment: 0 History: Peripheral Arterial Disease, History of pressure wounds, Clustered Wound: No Osteoarthritis Photos Wound Measurements Length: (cm) 1.2 % Re Width: (cm) 1.2 % Re Depth: (cm) 0.1 Area: (cm) 1.131 Volume: (cm) 0.113 duction in Area: 0% duction in Volume: 0% Wound Description Classification: Unclassifiable Wound Bed Exposed Structure Fascia Exposed: No Fat Layer (Subcutaneous Tissue) Exposed: No Tendon Exposed: No Muscle Exposed: No Joint Exposed: No Bone Exposed: No Limited to Skin Breakdown Treatment Notes Wound #6 (Calcaneus) Wound Laterality: Left, Medial Cleanser Peri-Wound Care Topical Primary Dressing Secondary Dressing [name], LOPER ([id number]) Secured With Compression Wrap Compression Stockings Add-Ons Electronic Signature(s) Signed: 08/18/2021 4:52:49 PM By: [name], BSN, RN, CWS, Kim RN, BSN Entered By: [name], BSN, RN, CWS, Kim on 08/18/2021 16:08:04 [name] ([id number]) -------------------------------------------------------------------------------- [place] Details Patient Name: [name] Date of Service: 08/18/2021 3:15 PM Medical Record Number: [id number] Patient Account Number: [id number] Date of Birth/Sex: 18-Jul-1927 (88 y.o. F) Treating RN: [name] Primary Care Mahnoor Mathisen: [name] Other Clinician: Referring  Dalton Mille: [name] Treating Brinson Tozzi/Extender: [name] in Treatment: 32 Vital Signs Time Taken: 15:44  Temperature (F): 98.2 Height (in): 66 Pulse (bpm): 66 Weight (lbs): 76 Respiratory Rate (breaths/min): 16 Body Mass Index (BMI): 12.3 Blood Pressure (mmHg): 182/74 Reference Range: 80 - 120 mg / dl Electronic Signature(s) Signed: 08/18/2021 4:52:49 PM By: [name], BSN, RN, CWS, Kim RN, BSN Entered By: [name], BSN, RN, CWS, Kim on 08/18/2021 15:45:30

## 2021-08-18 NOTE — Progress Notes (Addendum)
**Note [name]-Identified via Obfuscation** RITAMARIE, ARKIN ([id number]) Visit Report for 08/18/2021 Chief Complaint Document Details Patient Name: [name], [name]. Date of Service: 08/18/2021 3:15 PM Medical Record Number: [id number] Patient Account Number: [id number] Date of Birth/Sex: 22-Nov-1927 (88 y.o. F) Treating RN: Cornell Barman Primary Care Provider: Viviana Simpler Other Clinician: Referring Provider: Viviana Simpler Treating Provider/Extender: Skipper Cliche in Treatment: 5 Information Obtained from: Patient Chief Complaint Pressure ulcer right ankle and left heel Electronic Signature(s) Signed: 08/18/2021 3:21:11 PM By: Worthy Keeler PA-C Entered By: Worthy Keeler on 08/18/2021 15:21:11 [name], [name] ([id number]) -------------------------------------------------------------------------------- HPI Details Patient Name: [name], [name] Date of Service: 08/18/2021 3:15 PM Medical Record Number: [id number] Patient Account Number: [id number] Date of Birth/Sex: [birth year] (88 y.o. F) Treating RN: Cornell Barman Primary Care Provider: Viviana Simpler Other Clinician: Referring Provider: Viviana Simpler Treating Provider/Extender: Skipper Cliche in Treatment: 7 History of Present Illness HPI Description: 10/21/2020 upon evaluation today patient presents for initial inspection here in our clinic concerning issues that she has been having quite some time in regard to her ankle and since she has been in the hospital with regard to the heel. The ankle in fact has been 5-6 years at least I am told. With that being said the heel ulcer occurred when she was in the hospital in December for hip surgery when she fractured her hip. She also was in the hospital Tuesday for altered mental status. Really there was nothing that was identified as the cause for this. She did have a. Fortunately there does not appear to be any signs of infection she tells me that she has had she believes arterial studies At [facility] clinic I could not  find Those studies at this point. Nonetheless I will continue to look and see what I can find before Then. The patient also has a skin tear on her arm this occurred more recently when she bumped this at home. The patient does have a history of hypertension, coronary artery disease, and peripheral vascular disease stated. 10/28/2020 I was able to find the patient's chart currently which shows that she did have an arterial study performed in May 2019. This showed that she had a abnormal right toe brachial index and a normal left toe brachial index. She was noncompressible as far as ABIs were concerned. She did appear to have triphasic flow at that time. Unfortunately the wound that is commented on in the report that I printed off and read mentions the same wound on the ankle that we are still dealing with at this point. Unfortunately this has not healed. And its been quite sometime about 3 years now. Fortunately there does not appear to be any signs of active infection systemically at this point. I think that the patient has done well with the Santyl over the past week which is good news. Patient's caregiver which is her daughter-in-law is concerned about the fact that she really does not feel qualified to be able to change the dressings and take care of this issue. There does not appear to be any signs of anything untoward going on at this point. I think she is done a great job applying the [employer] and I think that has done a great job for the patient is for soften up some of the necrotic tissue. With that being said I think the Xeroform on the arm also has done excellent. In general I am very pleased with where we stand. And I told the patient's daughter-in- law as well that  I also feel like she has done a great job over the past week taking care of her mother. 11/11/2020 upon evaluation today patient appears to be doing well with regard to her wounds. She has been tolerating the dressing changes without  complication her daughters been applying the Santyl which has done a great job. Ho with that being said I think now that we have good arterial study showing we can go ahead and proceed with sharp debridement at this point. 11/25/2020 upon evaluation today patient appears to be doing well with regard to her wounds. The Santyl has really helped to clean things up and overall she is doing quite excellent at this point. Fortunately there does not appear to be any signs of infection which is great news and in general extremely pleased. I do believe some debridement is in order and hopefully will be able to get her into a collagen dressing after this. 12/23/2020 upon evaluation today patient appears to be doing well with regard to her wound all things considered. Unfortunately it does sound like her daughter decided to let this air out because she felt like it was getting so wet. It sounds like she got border gauze dressings instead of border foam therefore there is really Nothing to catch the excess drainage which I think is the problem they were worried about the smell. Fortunately there does not appear to be any signs of active infection which is great news. Nonetheless I do not think we want to leave this just open to air. 01/13/2021 upon evaluation today patient's wounds actually appear to be about as good as have seen since have been taking care of her. Fortunately there does not appear to be any evidence of infection which is great and overall the biggest issue I see is that of fluid buildup. I think we need to do something to try to help manage this. I think if we can control her edema we get the wounds to heal more effectively. 01/27/2021 upon evaluation today patient appears to be doing well in regard to the wounds on her right lateral malleolus as well as the left heel and the right ischial tuberosity is unfortunately a new area that has arisen since we last saw her. She tells me currently that that is  from sitting too much. With that being said this actually appears to be doing the worst of anything so far that I see today. Fortunately there does not appear to be any signs of infection this is at least good news. Compression wrap seem to be doing excellent for her as far as the legs are concerned. 02/03/2021 upon evaluation today patient appears to be doing well with regard to all of her wounds. She has been tolerating the dressing changes without complication. Fortunately there is no signs of active infection at this time. No fevers, chills, nausea, vomiting, or diarrhea. 02/10/2021 upon evaluation today patient appears to be doing well with regard to her wounds. With that being said there is some slight evidence of hypergranulation in regard to the right ankle in particular and a little bit in regard to the left heel. I think Hydrofera Blue might be a better option to go to at this point based on what I am seeing especially since both of these wounds in particular seem to be a little bit stalled. I discussed that with the patient and her daughter today. With regard to the hip this is doing great with the collagen I think will get  very close to complete closure I recommend we continue with the collagen. 02/17/2021 upon evaluation today patient appears to be doing excellent in regard to her heel ulcers. She has been tolerating the dressing changes without complication and overall I am extremely pleased with where things stand today. There does not appear to be any signs of active infection which is great news. Overall I may think that we are headed in the proper direction based on what I am seeing. 02/24/2021 upon evaluation today patient's wounds actually are showing signs of improvement in regard to her heels both are doing awesome. In regard to her hip location this is actually reopened after being closed last week and to be perfectly honest I am more concerned here about the fact that pressures  causing this issue I do not think it has anything to do with her not having the collagen in place again it was completely healed last week there was no reason for the collagen to be there. 03/03/2021 upon evaluation today patient appears to be doing well with regard to her wounds. Everything is showing signs of improvement and [name], [name]. ([id number]) doing some much better as far as the overall size of the wound. Fortunately I think we are headed in the right direction. 03/10/2021 upon evaluation today patient appears to be doing well with regard to her wound. She is tolerating the dressing changes without complication. The left heel pretty much appears to be almost completely healed although I think it is probably can be 1 more week before I can call this 100% slough. The right ankle is significantly improved with that being said its not completely closed. With regard to the right hip this is completely closed. Overall I am very pleased with where things stand I think were getting very close to complete resolution. I discussed all this with the patient and her daughter-in-law today. 03/17/2021 upon evaluation today patient appears to be doing well with regard to her wounds. Fortunately there does not appear to be any signs of active infection which is great news and overall very pleased with where the patient stands today. I am in general thankful that overall she is making great progress here. There is good to be a little bit of debridement here to clear away some of the necrotic debris today on both wound locations. 03/24/2021 upon evaluation today patient appears to be doing excellent in regard to her wound. She has been tolerating the dressing changes without complication. Fortunately there does not appear to be any evidence of infection the left foot is completely healed this is great news. On the right at the ankle region this is still open though I do not think the alginate did quite as well  as I was hoping as far as getting this dried out. I think that we may just want to switch back to the [facility] which has done well up to this point. I was just hopeful this would wrap things up completely for [place]. 03/31/2021 upon evaluation today patient appears to be doing well with regard to her wound. This is on the right side which appears to be doing better than last week I think is definitely measuring smaller and this is great news. Overall I am very pleased with where we stand today. I do believe the [facility] is doing better for her. 04/14/2021 upon evaluation today patient appears to be doing well with regard to her wound. I am very pleased with the way it  stands I think that she is headed in the right direction. Unfortunately she is having issues with the Tubigrip neither she nor her daughter-in-law who is the primary caregiver can get this on. Subsequently that means that they have been having a very difficult time with complying with the necessary things for this left leg. With that being said I am really not sure what to do to help in that regard. We can always try a bigger that is larger size Tubigrip but at the same time that means that she may not get as good compression and it may not keep things under control the only way to know is to try. 04/21/2021 upon evaluation today patient appears to be doing well in regard to her wound. She has been tolerating the dressing changes without complication and in fact the right ankle ulcer is actually showing signs of excellent improvement I am very pleased with where things stand today. No fevers, chills, nausea, vomiting, or diarrhea. 04/28/2021 upon evaluation today patient appears to be doing well with regard to her ankle ulcer. This is actually showing signs of good improvement which is great news and overall very pleased with where we stand today. No fevers, chills, nausea, vomiting, or diarrhea. 05/05/2021 upon evaluation today  patient's wound is actually showing signs of significant improvement and overall I am extremely pleased with where we stand today. There does not appear to be any signs of active infection which is great news and overall I am extremely pleased with where we stand at this point. No fevers, chills, nausea, vomiting, or diarrhea. 05/12/2021 upon evaluation today patient appears to be doing okay in regard to her wound is measuring a little bit smaller today that is good news. With that being said she still continues to have significant issues here with an open wound on the lateral aspect of her ankle. Fortunately there does not appear to be any signs of active infection systemically which is great news. No fevers, chills, nausea, vomiting, or diarrhea. 06/13/2021 upon evaluation today patient unfortunately continues to have issues with her right leg. Specifically the ankle region where there is an open wound. Previously the wound on the left leg had completely closed. Nonetheless this left heel has reopened as well. I am going to perform some debridement in regard to the heel ulcer appears to be a fluid-filled blister underneath this area. She has not been here for such a long time due to the fact that she was very sick and they were not able to bring her out. That is the reason we have not seen her in over the past month. 06/20/2021 upon evaluation patient's wounds currently are doing well on the left heel this appears to be healed. There does not appear to be any signs of drainage at this time which is great news. No fevers, chills, nausea, vomiting, or diarrhea. On the right heel there is still an open wound but I feel like we are showing some signs of improvement this is still good to take a bit of time. 06/27/2021 upon evaluation today patient appears to be doing better in regard to her wound. This is actually showing signs of good improvement which is great news. Fortunately I do not see any signs of  infection currently which is great and overall we are finally seeing some actual decrease in the size I think that the compression wraps are making a big difference here. 12/27 some debris on the surface. No evidence of surrounding infection  we have been using collagen 07/21/2021 upon evaluation today patient appears to be doing well with regard to her wound. She has been tolerating the dressing changes without complication. Fortunately I do not see any evidence of active infection locally nor systemically at this time which is great news and overall I think the wound making excellent progress here. 08/04/2021 upon evaluation today patient's wound is showing signs of being a little bit moist and really not significantly smaller. Fortunately I do not see any signs of active infection locally nor systemically at this time that is good news. Nonetheless I do feel like she is doing quite well otherwise and her left leg is still doing awesome. 08/11/2021 upon evaluation today patient appears to be doing well with regard to her wound. She has been tolerating the dressing changes without complication. Fortunately there does not appear to be any signs of active infection at this time. The wound appears to be less macerated though I do believe she was moistening the alginate just a little bit I told her she does not need to do that when putting on a longer she voiced understanding and will not do so any longer. 08/18/2021 upon evaluation today patient's wound appears to be showing signs of being slightly larger compared to previous. We will talk in millimeters but nonetheless this is not headed in the right direction. Subsequently I am not sure exactly what the best plan will be going forward. I think Sorbact could be a good option willing to give this a try over the next week and see how things go. Unfortunately she does have a new blister on the left heel which has arisen nothing is open and I am hoping it will  reabsorb but again that may not be the case. We will have to keep a close eye on this. Electronic Signature(s) [name], [name] ([id number]) Signed: 08/18/2021 4:47:13 PM By: Worthy Keeler PA-C Entered By: Worthy Keeler on 08/18/2021 16:47:13 [name], [name] ([id number]) -------------------------------------------------------------------------------- Physical Exam Details Patient Name: DALISA, FORRER Date of Service: 08/18/2021 3:15 PM Medical Record Number: [id number] Patient Account Number: [id number] Date of Birth/Sex: [birth year] (88 y.o. F) Treating RN: Cornell Barman Primary Care Provider: Viviana Simpler Other Clinician: Referring Provider: Viviana Simpler Treating Provider/Extender: Skipper Cliche in Treatment: 48 Constitutional Well-nourished and well-hydrated in no acute distress. Respiratory normal breathing without difficulty. Psychiatric this patient is able to make decisions and demonstrates good insight into disease process. Alert and Oriented x 3. pleasant and cooperative. Notes Upon inspection patient's wound bed actually showed signs of good granulation and epithelization at this point. Fortunately I do not see any evidence of active infection locally or systemically which is great news. No sharp debridement performed today as it appears to be fairly clean in regard to the wound surface. Electronic Signature(s) Signed: 08/18/2021 4:47:31 PM By: Worthy Keeler PA-C Entered By: Worthy Keeler on 08/18/2021 16:47:31 Yerkes, [name] ([id number]) -------------------------------------------------------------------------------- Physician Orders Details Patient Name: KALIANNA, VERBEKE Date of Service: 08/18/2021 3:15 PM Medical Record Number: [id number] Patient Account Number: [id number] Date of Birth/Sex: 09/09/1927 (88 y.o. F) Treating RN: Cornell Barman Primary Care Provider: Viviana Simpler Other Clinician: Referring Provider: Viviana Simpler Treating Provider/Extender:  Skipper Cliche in Treatment: 53 Verbal / Phone Orders: No Diagnosis Coding ICD-10 Coding Code Description L89.513 Pressure ulcer of right ankle, stage 3 I73.89 Other specified peripheral vascular diseases L89.623 Pressure ulcer of left heel, stage 3 I10 Essential (primary) hypertension I25.10 Atherosclerotic heart  disease of native coronary artery without angina pectoris Follow-up Appointments o Return Appointment in 1 week. o Nurse Visit as needed Bathing/ Shower/ Hygiene o May shower with wound dressing protected with water repellent cover or cast protector. o No tub bath. Anesthetic (Use 'Patient Medications' Section for Anesthetic Order Entry) o Lidocaine applied to wound bed Edema Control - Lymphedema / Segmental Compressive Device / Other o Elevate legs to the level of the heart and pump ankles as often as possible o Elevate leg(s) parallel to the floor when sitting. o DO YOUR BEST to sleep in the bed at night. DO NOT sleep in your recliner. Long hours of sitting in a recliner leads to swelling of the legs and/or potential wounds on your backside. Additional Orders / Instructions o Follow Nutritious Diet and Increase Protein Intake Wound Treatment Wound #1 - Malleolus Wound Laterality: Right, Lateral Cleanser: Soap and Water 3 x Per Week/30 Days Discharge Instructions: Gently cleanse wound with antibacterial soap, rinse and pat dry prior to dressing wounds Primary Dressing: Cutimed Sorbact 1.5x 2.38 (in/in) 3 x Per Week/30 Days Discharge Instructions: A bacteria- and fungi binding wound dressing, suitable for cavities and fistulas. It is suitable as a wound filler and allows the passage of wound exudate into a secondary dressing. The dressing helps reducing odor and pain and can improve healing. Secondary Dressing: Zetuvit Plus Silicone Border Dressing 4x4 (in/in) (DME) (Generic) 3 x Per Week/30 Days Secured With: Tubigrip Size C, 2.75x10 (in/yd) 3 x Per  Week/30 Days Discharge Instructions: Apply 3 Tubigrip C 3-finger-widths below knee to base of toes to secure dressing and/or for swelling. Electronic Signature(s) Signed: 08/18/2021 4:40:28 PM By: Gretta Cool, BSN, RN, CWS, Kim RN, BSN Signed: 08/18/2021 4:49:47 PM By: Worthy Keeler PA-C Entered By: Gretta Cool BSN, RN, CWS, Kim on 08/18/2021 16:40:27 [name], [name] ([id number]) -------------------------------------------------------------------------------- Problem List Details Patient Name: ALESSANDRIA, HENKEN Date of Service: 08/18/2021 3:15 PM Medical Record Number: [id number] Patient Account Number: [id number] Date of Birth/Sex: 03-25-1928 (88 y.o. F) Treating RN: Cornell Barman Primary Care Provider: Viviana Simpler Other Clinician: Referring Provider: Viviana Simpler Treating Provider/Extender: Skipper Cliche in Treatment: 38 Active Problems ICD-10 Encounter Code Description Active Date MDM Diagnosis L89.513 Pressure ulcer of right ankle, stage 3 10/21/2020 No Yes I73.89 Other specified peripheral vascular diseases 10/21/2020 No Yes L89.623 Pressure ulcer of left heel, stage 3 10/21/2020 No Yes I10 Essential (primary) hypertension 10/21/2020 No Yes I25.10 Atherosclerotic heart disease of native coronary artery without angina 10/21/2020 No Yes pectoris Inactive Problems Resolved Problems ICD-10 Code Description Active Date Resolved Date S51.802A Unspecified open wound of left forearm, initial encounter 10/21/2020 10/21/2020 L89.312 Pressure ulcer of right buttock, stage 2 01/27/2021 01/27/2021 Electronic Signature(s) Signed: 08/18/2021 3:21:05 PM By: Worthy Keeler PA-C Entered By: Worthy Keeler on 08/18/2021 15:21:04 [name] ([id number]) -------------------------------------------------------------------------------- Progress Note Details Patient Name: [name] Date of Service: 08/18/2021 3:15 PM Medical Record Number: [id number] Patient Account Number: [id number] Date of  Birth/Sex: [birth year] (88 y.o. F) Treating RN: Cornell Barman Primary Care Provider: Viviana Simpler Other Clinician: Referring Provider: Viviana Simpler Treating Provider/Extender: Skipper Cliche in Treatment: 102 Subjective Chief Complaint Information obtained from Patient Pressure ulcer right ankle and left heel History of Present Illness (HPI) 10/21/2020 upon evaluation today patient presents for initial inspection here in our clinic concerning issues that she has been having quite some time in regard to her ankle and since she has been in the hospital with regard to the heel. The  ankle in fact has been 5-6 years at least I am told. With that being said the heel ulcer occurred when she was in the hospital in December for hip surgery when she fractured her hip. She also was in the hospital Tuesday for altered mental status. Really there was nothing that was identified as the cause for this. She did have a. Fortunately there does not appear to be any signs of infection she tells me that she has had she believes arterial studies At [facility] clinic I could not find Those studies at this point. Nonetheless I will continue to look and see what I can find before Then. The patient also has a skin tear on her arm this occurred more recently when she bumped this at home. The patient does have a history of hypertension, coronary artery disease, and peripheral vascular disease stated. 10/28/2020 I was able to find the patient's chart currently which shows that she did have an arterial study performed in May 2019. This showed that she had a abnormal right toe brachial index and a normal left toe brachial index. She was noncompressible as far as ABIs were concerned. She did appear to have triphasic flow at that time. Unfortunately the wound that is commented on in the report that I printed off and read mentions the same wound on the ankle that we are still dealing with at this point. Unfortunately this has  not healed. And its been quite sometime about 3 years now. Fortunately there does not appear to be any signs of active infection systemically at this point. I think that the patient has done well with the Santyl over the past week which is good news. Patient's caregiver which is her daughter-in-law is concerned about the fact that she really does not feel qualified to be able to change the dressings and take care of this issue. There does not appear to be any signs of anything untoward going on at this point. I think she is done a great job applying the [employer] and I think that has done a great job for the patient is for soften up some of the necrotic tissue. With that being said I think the Xeroform on the arm also has done excellent. In general I am very pleased with where we stand. And I told the patient's daughter-in- law as well that I also feel like she has done a great job over the past week taking care of her mother. 11/11/2020 upon evaluation today patient appears to be doing well with regard to her wounds. She has been tolerating the dressing changes without complication her daughters been applying the Santyl which has done a great job. Ho with that being said I think now that we have good arterial study showing we can go ahead and proceed with sharp debridement at this point. 11/25/2020 upon evaluation today patient appears to be doing well with regard to her wounds. The Santyl has really helped to clean things up and overall she is doing quite excellent at this point. Fortunately there does not appear to be any signs of infection which is great news and in general extremely pleased. I do believe some debridement is in order and hopefully will be able to get her into a collagen dressing after this. 12/23/2020 upon evaluation today patient appears to be doing well with regard to her wound all things considered. Unfortunately it does sound like her daughter decided to let this air out because she  felt like it was getting  so wet. It sounds like she got border gauze dressings instead of border foam therefore there is really Nothing to catch the excess drainage which I think is the problem they were worried about the smell. Fortunately there does not appear to be any signs of active infection which is great news. Nonetheless I do not think we want to leave this just open to air. 01/13/2021 upon evaluation today patient's wounds actually appear to be about as good as have seen since have been taking care of her. Fortunately there does not appear to be any evidence of infection which is great and overall the biggest issue I see is that of fluid buildup. I think we need to do something to try to help manage this. I think if we can control her edema we get the wounds to heal more effectively. 01/27/2021 upon evaluation today patient appears to be doing well in regard to the wounds on her right lateral malleolus as well as the left heel and the right ischial tuberosity is unfortunately a new area that has arisen since we last saw her. She tells me currently that that is from sitting too much. With that being said this actually appears to be doing the worst of anything so far that I see today. Fortunately there does not appear to be any signs of infection this is at least good news. Compression wrap seem to be doing excellent for her as far as the legs are concerned. 02/03/2021 upon evaluation today patient appears to be doing well with regard to all of her wounds. She has been tolerating the dressing changes without complication. Fortunately there is no signs of active infection at this time. No fevers, chills, nausea, vomiting, or diarrhea. 02/10/2021 upon evaluation today patient appears to be doing well with regard to her wounds. With that being said there is some slight evidence of hypergranulation in regard to the right ankle in particular and a little bit in regard to the left heel. I think Hydrofera  Blue might be a better option to go to at this point based on what I am seeing especially since both of these wounds in particular seem to be a little bit stalled. I discussed that with the patient and her daughter today. With regard to the hip this is doing great with the collagen I think will get very close to complete closure I recommend we continue with the collagen. 02/17/2021 upon evaluation today patient appears to be doing excellent in regard to her heel ulcers. She has been tolerating the dressing changes without complication and overall I am extremely pleased with where things stand today. There does not appear to be any signs of active infection which is great news. Overall I may think that we are headed in the proper direction based on what I am seeing. 02/24/2021 upon evaluation today patient's wounds actually are showing signs of improvement in regard to her heels both are doing awesome. In [place], [place] ([id number]) regard to her hip location this is actually reopened after being closed last week and to be perfectly honest I am more concerned here about the fact that pressures causing this issue I do not think it has anything to do with her not having the collagen in place again it was completely healed last week there was no reason for the collagen to be there. 03/03/2021 upon evaluation today patient appears to be doing well with regard to her wounds. Everything is showing signs of improvement and doing some  much better as far as the overall size of the wound. Fortunately I think we are headed in the right direction. 03/10/2021 upon evaluation today patient appears to be doing well with regard to her wound. She is tolerating the dressing changes without complication. The left heel pretty much appears to be almost completely healed although I think it is probably can be 1 more week before I can call this 100% slough. The right ankle is significantly improved with that being said its not  completely closed. With regard to the right hip this is completely closed. Overall I am very pleased with where things stand I think were getting very close to complete resolution. I discussed all this with the patient and her daughter-in-law today. 03/17/2021 upon evaluation today patient appears to be doing well with regard to her wounds. Fortunately there does not appear to be any signs of active infection which is great news and overall very pleased with where the patient stands today. I am in general thankful that overall she is making great progress here. There is good to be a little bit of debridement here to clear away some of the necrotic debris today on both wound locations. 03/24/2021 upon evaluation today patient appears to be doing excellent in regard to her wound. She has been tolerating the dressing changes without complication. Fortunately there does not appear to be any evidence of infection the left foot is completely healed this is great news. On the right at the ankle region this is still open though I do not think the alginate did quite as well as I was hoping as far as getting this dried out. I think that we may just want to switch back to the [facility] which has done well up to this point. I was just hopeful this would wrap things up completely for [place]. 03/31/2021 upon evaluation today patient appears to be doing well with regard to her wound. This is on the right side which appears to be doing better than last week I think is definitely measuring smaller and this is great news. Overall I am very pleased with where we stand today. I do believe the [facility] is doing better for her. 04/14/2021 upon evaluation today patient appears to be doing well with regard to her wound. I am very pleased with the way it stands I think that she is headed in the right direction. Unfortunately she is having issues with the Tubigrip neither she nor her daughter-in-law who is the  primary caregiver can get this on. Subsequently that means that they have been having a very difficult time with complying with the necessary things for this left leg. With that being said I am really not sure what to do to help in that regard. We can always try a bigger that is larger size Tubigrip but at the same time that means that she may not get as good compression and it may not keep things under control the only way to know is to try. 04/21/2021 upon evaluation today patient appears to be doing well in regard to her wound. She has been tolerating the dressing changes without complication and in fact the right ankle ulcer is actually showing signs of excellent improvement I am very pleased with where things stand today. No fevers, chills, nausea, vomiting, or diarrhea. 04/28/2021 upon evaluation today patient appears to be doing well with regard to her ankle ulcer. This is actually showing signs of good improvement which is great news and  overall very pleased with where we stand today. No fevers, chills, nausea, vomiting, or diarrhea. 05/05/2021 upon evaluation today patient's wound is actually showing signs of significant improvement and overall I am extremely pleased with where we stand today. There does not appear to be any signs of active infection which is great news and overall I am extremely pleased with where we stand at this point. No fevers, chills, nausea, vomiting, or diarrhea. 05/12/2021 upon evaluation today patient appears to be doing okay in regard to her wound is measuring a little bit smaller today that is good news. With that being said she still continues to have significant issues here with an open wound on the lateral aspect of her ankle. Fortunately there does not appear to be any signs of active infection systemically which is great news. No fevers, chills, nausea, vomiting, or diarrhea. 06/13/2021 upon evaluation today patient unfortunately continues to have issues with  her right leg. Specifically the ankle region where there is an open wound. Previously the wound on the left leg had completely closed. Nonetheless this left heel has reopened as well. I am going to perform some debridement in regard to the heel ulcer appears to be a fluid-filled blister underneath this area. She has not been here for such a long time due to the fact that she was very sick and they were not able to bring her out. That is the reason we have not seen her in over the past month. 06/20/2021 upon evaluation patient's wounds currently are doing well on the left heel this appears to be healed. There does not appear to be any signs of drainage at this time which is great news. No fevers, chills, nausea, vomiting, or diarrhea. On the right heel there is still an open wound but I feel like we are showing some signs of improvement this is still good to take a bit of time. 06/27/2021 upon evaluation today patient appears to be doing better in regard to her wound. This is actually showing signs of good improvement which is great news. Fortunately I do not see any signs of infection currently which is great and overall we are finally seeing some actual decrease in the size I think that the compression wraps are making a big difference here. 12/27 some debris on the surface. No evidence of surrounding infection we have been using collagen 07/21/2021 upon evaluation today patient appears to be doing well with regard to her wound. She has been tolerating the dressing changes without complication. Fortunately I do not see any evidence of active infection locally nor systemically at this time which is great news and overall I think the wound making excellent progress here. 08/04/2021 upon evaluation today patient's wound is showing signs of being a little bit moist and really not significantly smaller. Fortunately I do not see any signs of active infection locally nor systemically at this time that is good  news. Nonetheless I do feel like she is doing quite well otherwise and her left leg is still doing awesome. 08/11/2021 upon evaluation today patient appears to be doing well with regard to her wound. She has been tolerating the dressing changes without complication. Fortunately there does not appear to be any signs of active infection at this time. The wound appears to be less macerated though I do believe she was moistening the alginate just a little bit I told her she does not need to do that when putting on a longer she voiced understanding and will  not do so any longer. 08/18/2021 upon evaluation today patient's wound appears to be showing signs of being slightly larger compared to previous. We will talk in millimeters but nonetheless this is not headed in the right direction. Subsequently I am not sure exactly what the best plan will be going forward. I think Sorbact could be a good option willing to give this a try over the next week and see how things go. Unfortunately she does have a new blister on the left heel which has arisen nothing is open and I am hoping it will reabsorb but again that may not be the case. We will have to keep [name], [name]. ([id number]) a close eye on this. Objective Constitutional Well-nourished and well-hydrated in no acute distress. Vitals Time Taken: 3:44 PM, Height: 66 in, Weight: 76 lbs, BMI: 12.3, Temperature: 98.2 F, Pulse: 66 bpm, Respiratory Rate: 16 breaths/min, Blood Pressure: 182/74 mmHg. Respiratory normal breathing without difficulty. Psychiatric this patient is able to make decisions and demonstrates good insight into disease process. Alert and Oriented x 3. pleasant and cooperative. General Notes: Upon inspection patient's wound bed actually showed signs of good granulation and epithelization at this point. Fortunately I do not see any evidence of active infection locally or systemically which is great news. No sharp debridement performed today  as it appears to be fairly clean in regard to the wound surface. Integumentary (Hair, Skin) Wound #1 status is Open. Original cause of wound was Gradually Appeared. The date acquired was: 10/22/2014. The wound has been in treatment 43 weeks. The wound is located on the Right,Lateral Malleolus. The wound measures 1.2cm length x 1.6cm width x 0.2cm depth; 1.508cm^2 area and 0.302cm^3 volume. There is Fat Layer (Subcutaneous Tissue) exposed. There is no tunneling or undermining noted. There is a large amount of serosanguineous drainage noted. The wound margin is flat and intact. There is no granulation within the wound bed. There is a large (67-100%) amount of necrotic tissue within the wound bed including Adherent Slough. Wound #6 status is Open. Original cause of wound was Blister. The date acquired was: 08/18/2021. The wound is located on the Left,Medial Calcaneus. The wound measures 1.2cm length x 1.2cm width x 0.1cm depth; 1.131cm^2 area and 0.113cm^3 volume. The wound is limited to skin breakdown. Assessment Active Problems ICD-10 Pressure ulcer of right ankle, stage 3 Other specified peripheral vascular diseases Pressure ulcer of left heel, stage 3 Essential (primary) hypertension Atherosclerotic heart disease of native coronary artery without angina pectoris Plan Follow-up Appointments: Return Appointment in 1 week. Nurse Visit as needed Bathing/ Shower/ Hygiene: May shower with wound dressing protected with water repellent cover or cast protector. No tub bath. Anesthetic (Use 'Patient Medications' Section for Anesthetic Order Entry): Lidocaine applied to wound bed Edema Control - Lymphedema / Segmental Compressive Device / Other: Elevate legs to the level of the heart and pump ankles as often as possible Elevate leg(s) parallel to the floor when sitting. DO YOUR BEST to sleep in the bed at night. DO NOT sleep in your recliner. Long hours of sitting in a recliner leads to swelling  of the legs and/or potential wounds on your backside. [name], [name] ([id number]) Additional Orders / Instructions: Follow Nutritious Diet and Increase Protein Intake WOUND #1: - Malleolus Wound Laterality: Right, Lateral Cleanser: Soap and Water 3 x Per Week/30 Days Discharge Instructions: Gently cleanse wound with antibacterial soap, rinse and pat dry prior to dressing wounds Primary Dressing: Cutimed Sorbact 1.5x 2.38 (in/in) 3 x Per  Week/30 Days Discharge Instructions: A bacteria- and fungi binding wound dressing, suitable for cavities and fistulas. It is suitable as a wound filler and allows the passage of wound exudate into a secondary dressing. The dressing helps reducing odor and pain and can improve healing. Secondary Dressing: Zetuvit Plus Silicone Border Dressing 4x4 (in/in) (DME) (Generic) 3 x Per Week/30 Days Secured With: Tubigrip Size C, 2.75x10 (in/yd) 3 x Per Week/30 Days Discharge Instructions: Apply 3 Tubigrip C 3-finger-widths below knee to base of toes to secure dressing and/or for swelling. 1. Would recommend that we going to continue with the wound care measures as before and the patient is in agreement with plan. This includes the use of the Tubigrip which I think has been beneficial. 2. I am also can recommend that we switch over to Sorbact followed by the border foam dressing which we have been using hopefully Sorbact will help keep this clean and from being too moist while at the same time promoting better healing than what we are seeing with the alginate. 3. I am also can recommend the patient continue to keep pressure off of her heels also think new shoes are warranted I did show them some of the [employer] Shoe's on the website today the patient is actually excited about these hopefully that can be beneficial as well. We will see patient back for reevaluation in 1 week here in the clinic. If anything worsens or changes patient will contact our office for  additional recommendations. Electronic Signature(s) Signed: 08/18/2021 4:48:20 PM By: Worthy Keeler PA-C Entered By: Worthy Keeler on 08/18/2021 16:48:19 [name], [name] ([id number]) -------------------------------------------------------------------------------- SuperBill Details Patient Name: [name], [name] Date of Service: 08/18/2021 Medical Record Number: [id number] Patient Account Number: [id number] Date of Birth/Sex: 19-Oct-1927 (88 y.o. F) Treating RN: Cornell Barman Primary Care Provider: Viviana Simpler Other Clinician: Referring Provider: Viviana Simpler Treating Provider/Extender: Skipper Cliche in Treatment: 43 Diagnosis Coding ICD-10 Codes Code Description [phone] Pressure ulcer of right ankle, stage 3 I73.89 Other specified peripheral vascular diseases L89.623 Pressure ulcer of left heel, stage 3 I10 Essential (primary) hypertension I25.10 Atherosclerotic heart disease of native coronary artery without angina pectoris Facility Procedures CPT4 Code: [id number] Description: 99213 - WOUND CARE VISIT-LEV 3 EST PT Modifier: Quantity: 1 Physician Procedures CPT4 Code: 2992426 Description: 99214 - WC PHYS LEVEL 4 - EST PT Modifier: Quantity: 1 CPT4 Code: Description: ICD-10 Diagnosis Description L89.513 Pressure ulcer of right ankle, stage 3 I73.89 Other specified peripheral vascular diseases L89.623 Pressure ulcer of left heel, stage 3 I10 Essential (primary) hypertension Modifier: Quantity: Electronic Signature(s) Signed: 08/18/2021 4:49:42 PM By: Gretta Cool, BSN, RN, CWS, Kim RN, BSN Signed: 08/18/2021 4:49:47 PM By: Worthy Keeler PA-C Previous Signature: 08/18/2021 4:49:02 PM Version By: Worthy Keeler PA-C Entered By: Gretta Cool, BSN, RN, CWS, Kim on 08/18/2021 16:49:41

## 2021-08-23 DIAGNOSIS — E11621 Type 2 diabetes mellitus with foot ulcer: Secondary | ICD-10-CM | POA: Diagnosis not present

## 2021-08-25 ENCOUNTER — Encounter: Payer: PPO | Admitting: Internal Medicine

## 2021-08-25 ENCOUNTER — Other Ambulatory Visit: Payer: Self-pay

## 2021-08-25 DIAGNOSIS — L97312 Non-pressure chronic ulcer of right ankle with fat layer exposed: Secondary | ICD-10-CM | POA: Diagnosis not present

## 2021-08-25 DIAGNOSIS — L89623 Pressure ulcer of left heel, stage 3: Secondary | ICD-10-CM | POA: Diagnosis not present

## 2021-08-25 DIAGNOSIS — L89513 Pressure ulcer of right ankle, stage 3: Secondary | ICD-10-CM | POA: Diagnosis not present

## 2021-08-25 DIAGNOSIS — L97321 Non-pressure chronic ulcer of left ankle limited to breakdown of skin: Secondary | ICD-10-CM | POA: Diagnosis not present

## 2021-08-25 DIAGNOSIS — I70233 Atherosclerosis of native arteries of right leg with ulceration of ankle: Secondary | ICD-10-CM | POA: Diagnosis not present

## 2021-08-25 NOTE — Progress Notes (Signed)
KRISTEN, FROMM ([id number]) Visit Report for 08/25/2021 Arrival Information Details Patient Name: [name], [name] Date of Service: 08/25/2021 3:15 PM Medical Record Number: [id number] Patient Account Number: [id number] Date of Birth/Sex: 06/05/1928 (88 y.o. F) Treating RN: Donnamarie Poag Primary Care Kajsa Butrum: Viviana Simpler Other Clinician: Referring Caio Devera: Viviana Simpler Treating Mehul Rudin/Extender: Tito Dine in Treatment: 22 Visit Information History Since Last Visit Added or deleted any medications: No Patient Arrived: [name] Had a fall or experienced change in No Arrival Time: 14:59 activities of daily living that may affect Accompanied By: daughter in law risk of falls: Transfer Assistance: None Hospitalized since last visit: No Patient Identification Verified: Yes Has Dressing in Place as Prescribed: Yes Secondary Verification Process Completed: Yes Pain Present Now: No Patient Requires Transmission-Based Precautions: No Patient Has Alerts: Yes Patient Alerts: NOT DIABETIC TBI left .77 TBI right .76 Electronic Signature(s) Signed: 08/25/2021 3:50:58 PM By: Donnamarie Poag Entered By: Donnamarie Poag on 08/25/2021 15:05:41 [name] ([id number]) -------------------------------------------------------------------------------- Clinic Level of Care Assessment Details Patient Name: [name], [name] Date of Service: 08/25/2021 3:15 PM Medical Record Number: [id number] Patient Account Number: [id number] Date of Birth/Sex: 1927-12-31 (88 y.o. F) Treating RN: Donnamarie Poag Primary Care Vince Ainsley: Viviana Simpler Other Clinician: Referring Alasia Enge: Viviana Simpler Treating Dayten Juba/Extender: Tito Dine in Treatment: 16 Clinic Level of Care Assessment Items TOOL 4 Quantity Score [] - Use when only an EandM is performed on FOLLOW-UP visit 0 ASSESSMENTS - Nursing Assessment / Reassessment [] - Reassessment of Co-morbidities (includes updates in patient  status) 0 [] - 0 Reassessment of Adherence to Treatment Plan ASSESSMENTS - Wound and Skin Assessment / Reassessment [] - Simple Wound Assessment / Reassessment - one wound 0 X- 2 5 Complex Wound Assessment / Reassessment - multiple wounds [] - 0 Dermatologic / Skin Assessment (not related to wound area) ASSESSMENTS - Focused Assessment [] - Circumferential Edema Measurements - multi extremities 0 [] - 0 Nutritional Assessment / Counseling / Intervention [] - 0 Lower Extremity Assessment (monofilament, tuning fork, pulses) [] - 0 Peripheral Arterial Disease Assessment (using hand held doppler) ASSESSMENTS - Ostomy and/or Continence Assessment and Care [] - Incontinence Assessment and Management 0 [] - 0 Ostomy Care Assessment and Management (repouching, etc.) PROCESS - Coordination of Care X - Simple Patient / Family Education for ongoing care 1 15 [] - 0 Complex (extensive) Patient / Family Education for ongoing care [] - 0 Staff obtains Programmer, systems, Records, Test Results / Process Orders [] - 0 Staff telephones HHA, Nursing Homes / Clarify orders / etc [] - 0 Routine Transfer to another Facility (non-emergent condition) [] - 0 Routine Hospital Admission (non-emergent condition) [] - 0 New Admissions / Biomedical engineer / Ordering NPWT, Apligraf, etc. [] - 0 Emergency Hospital Admission (emergent condition) X- 1 10 Simple Discharge Coordination [] - 0 Complex (extensive) Discharge Coordination PROCESS - Special Needs [] - Pediatric / Minor Patient Management 0 [] - 0 Isolation Patient Management [] - 0 Hearing / Language / Visual special needs [] - 0 Assessment of Community assistance (transportation, D/C planning, etc.) [] - 0 Additional assistance / Altered mentation [] - 0 Support Surface(s) Assessment (bed, cushion, seat, etc.) INTERVENTIONS - Wound Cleansing / Measurement TORIN, WHISNER ([id number]) [] - 0 Simple Wound Cleansing - one wound X- 2  5 Complex Wound Cleansing - multiple wounds X- 1 5 Wound Imaging (photographs - any number of wounds) [] - 0 Wound Tracing (instead of photographs) [] - 0  Simple Wound Measurement - one wound X- 2 5 Complex Wound Measurement - multiple wounds INTERVENTIONS - Wound Dressings X - Small Wound Dressing one or multiple wounds 2 10 [] - 0 Medium Wound Dressing one or multiple wounds [] - 0 Large Wound Dressing one or multiple wounds X- 1 5 Application of Medications - topical [] - 0 Application of Medications - injection INTERVENTIONS - Miscellaneous [] - External ear exam 0 [] - 0 Specimen Collection (cultures, biopsies, blood, body fluids, etc.) [] - 0 Specimen(s) / Culture(s) sent or taken to Lab for analysis [] - 0 Patient Transfer (multiple staff / Civil Service fast streamer / Similar devices) [] - 0 Simple Staple / Suture removal (25 or less) [] - 0 Complex Staple / Suture removal (26 or more) [] - 0 Hypo / Hyperglycemic Management (close monitor of Blood Glucose) [] - 0 Ankle / Brachial Index (ABI) - do not check if billed separately X- 1 5 Vital Signs Has the patient been seen at the hospital within the last three years: Yes Total Score: 90 Level Of Care: New/Established - Level 3 Electronic Signature(s) Signed: 08/25/2021 3:50:58 PM By: Donnamarie Poag Entered By: Donnamarie Poag on 08/25/2021 15:32:56 Candella, [name] ([id number]) -------------------------------------------------------------------------------- Encounter Discharge Information Details Patient Name: [name], [name] Date of Service: 08/25/2021 3:15 PM Medical Record Number: [id number] Patient Account Number: [id number] Date of Birth/Sex: [birth year] (88 y.o. F) Treating RN: Donnamarie Poag Primary Care Provider: Viviana Simpler Other Clinician: Referring Provider: Viviana Simpler Treating Provider/Extender: Tito Dine in Treatment: 37 Encounter Discharge Information Items Discharge Condition: Stable Ambulatory  Status: Walker Discharge Destination: Home Transportation: Private Auto Accompanied By: daughter in law Schedule Follow-up Appointment: Yes Clinical Summary of Care: Electronic Signature(s) Signed: 08/25/2021 3:50:58 PM By: Donnamarie Poag Entered By: Donnamarie Poag on 08/25/2021 15:39:13 Vankuren, [name] ([id number]) -------------------------------------------------------------------------------- Lower Extremity Assessment Details Patient Name: [name], [name] Date of Service: 08/25/2021 3:15 PM Medical Record Number: [id number] Patient Account Number: [id number] Date of Birth/Sex: [birth year] (88 y.o. F) Treating RN: Donnamarie Poag Primary Care Provider: Viviana Simpler Other Clinician: Referring Provider: Viviana Simpler Treating Provider/Extender: Tito Dine in Treatment: 44 Edema Assessment Assessed: [Left: No] [Right: No] Edema: [Left: Yes] [Right: Yes] Calf Left: Right: Point of Measurement: 33 cm From Medial Instep 24.5 cm Ankle Left: Right: Point of Measurement: 9 cm From Medial Instep 19 cm 20 cm Vascular Assessment Pulses: Dorsalis Pedis Palpable: [Left:Yes] [Right:Yes] Electronic Signature(s) Signed: 08/25/2021 3:50:58 PM By: Donnamarie Poag Entered By: Donnamarie Poag on 08/25/2021 15:13:48 Bakken, [name] ([id number]) -------------------------------------------------------------------------------- Multi Wound Chart Details Patient Name: [name] Date of Service: 08/25/2021 3:15 PM Medical Record Number: [id number] Patient Account Number: [id number] Date of Birth/Sex: 11/02/1927 (88 y.o. F) Treating RN: Donnamarie Poag Primary Care Provider: Viviana Simpler Other Clinician: Referring Provider: Viviana Simpler Treating Provider/Extender: Tito Dine in Treatment: 52 Vital Signs Height(in): 66 Pulse(bpm): 74 Weight(lbs): 81 Blood Pressure(mmHg): 155/85 Body Mass Index(BMI): 12.3 Temperature(F): 98.1 Respiratory Rate(breaths/min): 16 Photos:  [N/A:N/A] Wound Location: Right, Lateral Malleolus Left, Medial Calcaneus N/A Wounding Event: Gradually Appeared Blister N/A Primary Etiology: Pressure Ulcer Abrasion N/A Comorbid History: Arrhythmia, Coronary Artery Arrhythmia, Coronary Artery N/A Disease, Hypertension, Peripheral Disease, Hypertension, Peripheral Arterial Disease, History of pressure Arterial Disease, History of pressure wounds, Osteoarthritis wounds, Osteoarthritis Date Acquired: 10/22/2014 08/18/2021 N/A Weeks of Treatment: 44 1 N/A Wound Status: Open Open N/A Wound Recurrence: No No N/A Measurements L x W x D (cm) 0.8x0.8x0.2 2x2x0.1 N/A Area (cm) :  0.503 3.142 N/A Volume (cm) : 0.101 0.314 N/A % Reduction in Area: 35.90% -177.80% N/A % Reduction in Volume: -27.80% -177.90% N/A Classification: Category/Stage III Partial Thickness N/A Exudate Amount: Large Medium N/A Exudate Type: Serosanguineous Serous N/A Exudate Color: red, brown amber N/A Wound Margin: Flat and Intact Flat and Intact N/A Granulation Amount: Small (1-33%) Large (67-100%) N/A Granulation Quality: Pink Red, Pink N/A Necrotic Amount: Large (67-100%) None Present (0%) N/A Exposed Structures: Fat Layer (Subcutaneous Tissue): Fascia: No N/A Yes Fat Layer (Subcutaneous Tissue): Fascia: No No Tendon: No Tendon: No Muscle: No Muscle: No Joint: No Joint: No Bone: No Bone: No Limited to Skin Breakdown Epithelialization: None N/A N/A Treatment Notes Electronic Signature(s) Signed: 08/25/2021 3:50:58 PM By: Donnamarie Poag Entered By: Donnamarie Poag on 08/25/2021 15:15:18 Seoane, [name] ([id number]) [name], [name] ([id number]) -------------------------------------------------------------------------------- [employer] Details Patient Name: [name], [name] Date of Service: 08/25/2021 3:15 PM Medical Record Number: [id number] Patient Account Number: [id number] Date of Birth/Sex: 10-Apr-1928 (88 y.o. F) Treating RN: Donnamarie Poag Primary Care Provider: Viviana Simpler Other Clinician: Referring Provider: Viviana Simpler Treating Provider/Extender: Tito Dine in Treatment: 10 Active Inactive Wound/Skin Impairment Nursing Diagnoses: Impaired tissue integrity Goals: Patient/caregiver will verbalize understanding of skin care regimen Date Initiated: 10/21/2020 Date Inactivated: 12/23/2020 Target Resolution Date: 12/21/2020 Goal Status: Met Ulcer/skin breakdown will have a volume reduction of 30% by week 4 Date Initiated: 10/21/2020 Date Inactivated: 11/25/2020 Target Resolution Date: 11/20/2020 Goal Status: Met Ulcer/skin breakdown will have a volume reduction of 50% by week 8 Date Initiated: 10/21/2020 Date Inactivated: 12/23/2020 Target Resolution Date: 12/21/2020 Goal Status: Met Ulcer/skin breakdown will have a volume reduction of 80% by week 12 Date Initiated: 10/21/2020 Date Inactivated: 02/17/2021 Target Resolution Date: 01/20/2021 Goal Status: Met Ulcer/skin breakdown will heal within 14 weeks Date Initiated: 10/21/2020 Target Resolution Date: 09/05/2021 Goal Status: Active Interventions: Assess patient/caregiver ability to obtain necessary supplies Assess patient/caregiver ability to perform ulcer/skin care regimen upon admission and as needed Assess ulceration(s) every visit Notes: Electronic Signature(s) Signed: 08/25/2021 3:50:58 PM By: Donnamarie Poag Entered By: Donnamarie Poag on 08/25/2021 15:13:59 Pung, [name] ([id number]) -------------------------------------------------------------------------------- Non-Wound Condition Assessment Details Patient Name: ABIAGEAL, BLOWE Date of Service: 08/25/2021 3:15 PM Medical Record Number: [id number] Patient Account Number: [id number] Date of Birth/Sex: [birth year] (88 y.o. F) Treating RN: Donnamarie Poag Primary Care Provider: Viviana Simpler Other Clinician: Referring Provider: Viviana Simpler Treating Provider/Extender: Ricard Dillon Weeks  in Treatment: 40 Non-Wound Condition: Condition: Other Dermatologic Condition Location: Foot Side: Left Photos Electronic Signature(s) Signed: 08/25/2021 3:50:58 PM By: Donnamarie Poag Entered By: Donnamarie Poag on 08/25/2021 15:24:01 Lovecchio, [name] ([id number]) -------------------------------------------------------------------------------- Pain Assessment Details Patient Name: [name], [name] Date of Service: 08/25/2021 3:15 PM Medical Record Number: [id number] Patient Account Number: [id number] Date of Birth/Sex: July 15, 1927 (88 y.o. F) Treating RN: Donnamarie Poag Primary Care Provider: Viviana Simpler Other Clinician: Referring Provider: Viviana Simpler Treating Provider/Extender: Tito Dine in Treatment: 61 Active Problems Location of Pain Severity and Description of Pain Patient Has Paino No Site Locations Rate the pain. Current Pain Level: 0 Pain Management and Medication Current Pain Management: Electronic Signature(s) Signed: 08/25/2021 3:50:58 PM By: Donnamarie Poag Entered By: Donnamarie Poag on 08/25/2021 15:07:07 [name] ([id number]) -------------------------------------------------------------------------------- Patient/Caregiver Education Details Patient Name: [name] Date of Service: 08/25/2021 3:15 PM Medical Record Number: [id number] Patient Account Number: [id number] Date of Birth/Gender: Jun 27, 1928 (88 y.o. F) Treating RN: Donnamarie Poag Primary Care Physician: Viviana Simpler Other Clinician: Referring  Physician: Viviana Simpler Treating Physician/Extender: Tito Dine in Treatment: 2 Education Assessment Education Provided To: Patient and Caregiver Education Topics Provided Basic Hygiene: Wound/Skin Impairment: Engineer, maintenance) Signed: 08/25/2021 3:50:58 PM By: Donnamarie Poag Entered By: Donnamarie Poag on 08/25/2021 15:33:56 Morici, [name]  ([id number]) -------------------------------------------------------------------------------- Wound Assessment Details Patient Name: [name], [name] Date of Service: 08/25/2021 3:15 PM Medical Record Number: [id number] Patient Account Number: [id number] Date of Birth/Sex: 1928/06/15 (88 y.o. F) Treating RN: Donnamarie Poag Primary Care Jayke Caul: Viviana Simpler Other Clinician: Referring Ryeleigh Santore: Viviana Simpler Treating Jcion Buddenhagen/Extender: Tito Dine in Treatment: 44 Wound Status Wound Number: 1 Primary Pressure Ulcer Etiology: Wound Location: Right, Lateral Malleolus Wound Open Wounding Event: Gradually Appeared Status: Date Acquired: 10/22/2014 Comorbid Arrhythmia, Coronary Artery Disease, Hypertension, Weeks Of Treatment: 44 History: Peripheral Arterial Disease, History of pressure wounds, Clustered Wound: No Osteoarthritis Photos Wound Measurements Length: (cm) 0.8 Width: (cm) 0.8 Depth: (cm) 0.2 Area: (cm) 0.503 Volume: (cm) 0.101 % Reduction in Area: 35.9% % Reduction in Volume: -27.8% Epithelialization: None Tunneling: No Undermining: No Wound Description Classification: Category/Stage III Wound Margin: Flat and Intact Exudate Amount: Large Exudate Type: Serosanguineous Exudate Color: red, brown Foul Odor After Cleansing: No Slough/Fibrino Yes Wound Bed Granulation Amount: Small (1-33%) Exposed Structure Granulation Quality: Pink Fascia Exposed: No Necrotic Amount: Large (67-100%) Fat Layer (Subcutaneous Tissue) Exposed: Yes Necrotic Quality: Adherent Slough Tendon Exposed: No Muscle Exposed: No Joint Exposed: No Bone Exposed: No Treatment Notes Wound #1 (Malleolus) Wound Laterality: Right, Lateral Cleanser Soap and Water Discharge Instruction: Gently cleanse wound with antibacterial soap, rinse and pat dry prior to dressing wounds WANDRA, BABIN ([id number]) Peri-Wound Care Topical Primary Dressing Cutimed Sorbact 1.5x 2.38  (in/in) Discharge Instruction: A bacteria- and fungi binding wound dressing, suitable for cavities and fistulas. It is suitable as a wound filler and allows the passage of wound exudate into a secondary dressing. The dressing helps reducing odor and pain and can improve healing. Secondary Dressing Zetuvit Plus Silicone Border Dressing 4x4 (in/in) Secured With Tubigrip Size C, 2.75x10 (in/yd) Discharge Instruction: Apply 3 Tubigrip C 3-finger-widths below knee to base of toes to secure dressing and/or for swelling. Compression Wrap Compression Stockings Add-Ons Electronic Signature(s) Signed: 08/25/2021 3:50:58 PM By: Donnamarie Poag Entered By: Donnamarie Poag on 08/25/2021 15:11:06 Schellhase, [name] ([id number]) -------------------------------------------------------------------------------- Wound Assessment Details Patient Name: [name], [name] Date of Service: 08/25/2021 3:15 PM Medical Record Number: [id number] Patient Account Number: [id number] Date of Birth/Sex: 1928/01/25 (88 y.o. F) Treating RN: Donnamarie Poag Primary Care Chayton Murata: Viviana Simpler Other Clinician: Referring Sinclair Alligood: Viviana Simpler Treating Kendry Pfarr/Extender: Tito Dine in Treatment: 44 Wound Status Wound Number: 6 Primary Abrasion Etiology: Wound Location: Left, Medial Calcaneus Wound Open Wounding Event: Blister Status: Date Acquired: 08/18/2021 Comorbid Arrhythmia, Coronary Artery Disease, Hypertension, Weeks Of Treatment: 1 History: Peripheral Arterial Disease, History of pressure wounds, Clustered Wound: No Osteoarthritis Photos Wound Measurements Length: (cm) 2 Width: (cm) 2 Depth: (cm) 0.1 Area: (cm) 3.142 Volume: (cm) 0.314 % Reduction in Area: -177.8% % Reduction in Volume: -177.9% Tunneling: No Undermining: No Wound Description Classification: Partial Thickness Wound Margin: Flat and Intact Exudate Amount: Medium Exudate Type: Serous Exudate Color: amber Foul Odor After  Cleansing: No Slough/Fibrino No Wound Bed Granulation Amount: Large (67-100%) Exposed Structure Granulation Quality: Red, Pink Fascia Exposed: No Necrotic Amount: None Present (0%) Fat Layer (Subcutaneous Tissue) Exposed: No Tendon Exposed: No Muscle Exposed: No Joint Exposed: No Bone Exposed: No Limited to Skin Breakdown Treatment Notes Wound #6 (Calcaneus)  Wound Laterality: Left, Medial Cleanser Soap and Water Discharge Instruction: Gently cleanse wound with antibacterial soap, rinse and pat dry prior to dressing wounds JLEIGH, STRIPLIN ([id number]) Peri-Wound Care Topical Primary Dressing Cutimed Sorbact 1.5x 2.38 (in/in) Discharge Instruction: A bacteria- and fungi binding wound dressing, suitable for cavities and fistulas. It is suitable as a wound filler and allows the passage of wound exudate into a secondary dressing. The dressing helps reducing odor and pain and can improve healing. Secondary Dressing Zetuvit Plus Silicone Border Dressing 4x4 (in/in) Secured With Tubigrip Size C, 2.75x10 (in/yd) Discharge Instruction: Apply 3 Tubigrip C 3-finger-widths below knee to base of toes to secure dressing and/or for swelling. Compression Wrap Compression Stockings Add-Ons Electronic Signature(s) Signed: 08/25/2021 3:50:58 PM By: Donnamarie Poag Entered By: Donnamarie Poag on 08/25/2021 15:12:37 Saephanh, [name] ([id number]) -------------------------------------------------------------------------------- [place] Details Patient Name: [name] Date of Service: 08/25/2021 3:15 PM Medical Record Number: [id number] Patient Account Number: [id number] Date of Birth/Sex: [birth year] (88 y.o. F) Treating RN: Donnamarie Poag Primary Care Provider: Viviana Simpler Other Clinician: Referring Provider: Viviana Simpler Treating Provider/Extender: Tito Dine in Treatment: 41 Vital Signs Time Taken: 15:05 Temperature (F): 98.1 Height (in): 66 Pulse (bpm): 74 Weight (lbs):  76 Respiratory Rate (breaths/min): 16 Body Mass Index (BMI): 12.3 Blood Pressure (mmHg): 155/85 Reference Range: 80 - 120 mg / dl Electronic Signature(s) Signed: 08/25/2021 3:50:58 PM By: Donnamarie Poag Entered ByDonnamarie Poag on 08/25/2021 15:06:20

## 2021-08-25 NOTE — Progress Notes (Signed)
[name], [name] ([id number]) Visit Report for 08/25/2021 HPI Details Patient Name: [name], [name] Date of Service: 08/25/2021 3:15 PM Medical Record Number: [id number] Patient Account Number: [id number] Date of Birth/Sex: 1928-05-12 (88 y.o. F) Treating RN: Donnamarie Poag Primary Care Provider: Viviana Simpler Other Clinician: Referring Provider: Viviana Simpler Treating Provider/Extender: Tito Dine in Treatment: 20 History of Present Illness HPI Description: 10/21/2020 upon evaluation today patient presents for initial inspection here in our clinic concerning issues that she has been having quite some time in regard to her ankle and since she has been in the hospital with regard to the heel. The ankle in fact has been 5-6 years at least I am told. With that being said the heel ulcer occurred when she was in the hospital in December for hip surgery when she fractured her hip. She also was in the hospital Tuesday for altered mental status. Really there was nothing that was identified as the cause for this. She did have a. Fortunately there does not appear to be any signs of infection she tells me that she has had she believes arterial studies At [facility] clinic I could not find Those studies at this point. Nonetheless I will continue to look and see what I can find before Then. The patient also has a skin tear on her arm this occurred more recently when she bumped this at home. The patient does have a history of hypertension, coronary artery disease, and peripheral vascular disease stated. 10/28/2020 I was able to find the patient's chart currently which shows that she did have an arterial study performed in May 2019. This showed that she had a abnormal right toe brachial index and a normal left toe brachial index. She was noncompressible as far as ABIs were concerned. She did appear to have triphasic flow at that time. Unfortunately the wound that is commented on in the report that I  printed off and read mentions the same wound on the ankle that we are still dealing with at this point. Unfortunately this has not healed. And its been quite sometime about 3 years now. Fortunately there does not appear to be any signs of active infection systemically at this point. I think that the patient has done well with the Santyl over the past week which is good news. Patient's caregiver which is her daughter-in-law is concerned about the fact that she really does not feel qualified to be able to change the dressings and take care of this issue. There does not appear to be any signs of anything untoward going on at this point. I think she is done a great job applying the [employer] and I think that has done a great job for the patient is for soften up some of the necrotic tissue. With that being said I think the Xeroform on the arm also has done excellent. In general I am very pleased with where we stand. And I told the patient's daughter-in- law as well that I also feel like she has done a great job over the past week taking care of her mother. 11/11/2020 upon evaluation today patient appears to be doing well with regard to her wounds. She has been tolerating the dressing changes without complication her daughters been applying the Santyl which has done a great job. Ho with that being said I think now that we have good arterial study showing we can go ahead and proceed with sharp debridement at this point. 11/25/2020 upon evaluation today patient appears to  be doing well with regard to her wounds. The Santyl has really helped to clean things up and overall she is doing quite excellent at this point. Fortunately there does not appear to be any signs of infection which is great news and in general extremely pleased. I do believe some debridement is in order and hopefully will be able to get her into a collagen dressing after this. 12/23/2020 upon evaluation today patient appears to be doing well with  regard to her wound all things considered. Unfortunately it does sound like her daughter decided to let this air out because she felt like it was getting so wet. It sounds like she got border gauze dressings instead of border foam therefore there is really Nothing to catch the excess drainage which I think is the problem they were worried about the smell. Fortunately there does not appear to be any signs of active infection which is great news. Nonetheless I do not think we want to leave this just open to air. 01/13/2021 upon evaluation today patient's wounds actually appear to be about as good as have seen since have been taking care of her. Fortunately there does not appear to be any evidence of infection which is great and overall the biggest issue I see is that of fluid buildup. I think we need to do something to try to help manage this. I think if we can control her edema we get the wounds to heal more effectively. 01/27/2021 upon evaluation today patient appears to be doing well in regard to the wounds on her right lateral malleolus as well as the left heel and the right ischial tuberosity is unfortunately a new area that has arisen since we last saw her. She tells me currently that that is from sitting too much. With that being said this actually appears to be doing the worst of anything so far that I see today. Fortunately there does not appear to be any signs of infection this is at least good news. Compression wrap seem to be doing excellent for her as far as the legs are concerned. 02/03/2021 upon evaluation today patient appears to be doing well with regard to all of her wounds. She has been tolerating the dressing changes without complication. Fortunately there is no signs of active infection at this time. No fevers, chills, nausea, vomiting, or diarrhea. 02/10/2021 upon evaluation today patient appears to be doing well with regard to her wounds. With that being said there is some slight  evidence of hypergranulation in regard to the right ankle in particular and a little bit in regard to the left heel. I think Hydrofera Blue might be a better option to go to at this point based on what I am seeing especially since both of these wounds in particular seem to be a little bit stalled. I discussed that with the patient and her daughter today. With regard to the hip this is doing great with the collagen I think will get very close to complete closure I recommend we continue with the collagen. 02/17/2021 upon evaluation today patient appears to be doing excellent in regard to her heel ulcers. She has been tolerating the dressing changes without complication and overall I am extremely pleased with where things stand today. There does not appear to be any signs of active infection which is great news. Overall I may think that we are headed in the proper direction based on what I am seeing. 02/24/2021 upon evaluation today patient's wounds actually are  showing signs of improvement in regard to her heels both are doing awesome. In regard to her hip location this is actually reopened after being closed last week and to be perfectly honest I am more concerned here about the [name], [name]. ([id number]) fact that pressures causing this issue I do not think it has anything to do with her not having the collagen in place again it was completely healed last week there was no reason for the collagen to be there. 03/03/2021 upon evaluation today patient appears to be doing well with regard to her wounds. Everything is showing signs of improvement and doing some much better as far as the overall size of the wound. Fortunately I think we are headed in the right direction. 03/10/2021 upon evaluation today patient appears to be doing well with regard to her wound. She is tolerating the dressing changes without complication. The left heel pretty much appears to be almost completely healed although I think it is  probably can be 1 more week before I can call this 100% slough. The right ankle is significantly improved with that being said its not completely closed. With regard to the right hip this is completely closed. Overall I am very pleased with where things stand I think were getting very close to complete resolution. I discussed all this with the patient and her daughter-in-law today. 03/17/2021 upon evaluation today patient appears to be doing well with regard to her wounds. Fortunately there does not appear to be any signs of active infection which is great news and overall very pleased with where the patient stands today. I am in general thankful that overall she is making great progress here. There is good to be a little bit of debridement here to clear away some of the necrotic debris today on both wound locations. 03/24/2021 upon evaluation today patient appears to be doing excellent in regard to her wound. She has been tolerating the dressing changes without complication. Fortunately there does not appear to be any evidence of infection the left foot is completely healed this is great news. On the right at the ankle region this is still open though I do not think the alginate did quite as well as I was hoping as far as getting this dried out. I think that we may just want to switch back to the [facility] which has done well up to this point. I was just hopeful this would wrap things up completely for [place]. 03/31/2021 upon evaluation today patient appears to be doing well with regard to her wound. This is on the right side which appears to be doing better than last week I think is definitely measuring smaller and this is great news. Overall I am very pleased with where we stand today. I do believe the [facility] is doing better for her. 04/14/2021 upon evaluation today patient appears to be doing well with regard to her wound. I am very pleased with the way it stands I think that she is headed  in the right direction. Unfortunately she is having issues with the Tubigrip neither she nor her daughter-in-law who is the primary caregiver can get this on. Subsequently that means that they have been having a very difficult time with complying with the necessary things for this left leg. With that being said I am really not sure what to do to help in that regard. We can always try a bigger that is larger size Tubigrip but at the same time that  means that she may not get as good compression and it may not keep things under control the only way to know is to try. 04/21/2021 upon evaluation today patient appears to be doing well in regard to her wound. She has been tolerating the dressing changes without complication and in fact the right ankle ulcer is actually showing signs of excellent improvement I am very pleased with where things stand today. No fevers, chills, nausea, vomiting, or diarrhea. 04/28/2021 upon evaluation today patient appears to be doing well with regard to her ankle ulcer. This is actually showing signs of good improvement which is great news and overall very pleased with where we stand today. No fevers, chills, nausea, vomiting, or diarrhea. 05/05/2021 upon evaluation today patient's wound is actually showing signs of significant improvement and overall I am extremely pleased with where we stand today. There does not appear to be any signs of active infection which is great news and overall I am extremely pleased with where we stand at this point. No fevers, chills, nausea, vomiting, or diarrhea. 05/12/2021 upon evaluation today patient appears to be doing okay in regard to her wound is measuring a little bit smaller today that is good news. With that being said she still continues to have significant issues here with an open wound on the lateral aspect of her ankle. Fortunately there does not appear to be any signs of active infection systemically which is great news. No fevers,  chills, nausea, vomiting, or diarrhea. 06/13/2021 upon evaluation today patient unfortunately continues to have issues with her right leg. Specifically the ankle region where there is an open wound. Previously the wound on the left leg had completely closed. Nonetheless this left heel has reopened as well. I am going to perform some debridement in regard to the heel ulcer appears to be a fluid-filled blister underneath this area. She has not been here for such a long time due to the fact that she was very sick and they were not able to bring her out. That is the reason we have not seen her in over the past month. 06/20/2021 upon evaluation patient's wounds currently are doing well on the left heel this appears to be healed. There does not appear to be any signs of drainage at this time which is great news. No fevers, chills, nausea, vomiting, or diarrhea. On the right heel there is still an open wound but I feel like we are showing some signs of improvement this is still good to take a bit of time. 06/27/2021 upon evaluation today patient appears to be doing better in regard to her wound. This is actually showing signs of good improvement which is great news. Fortunately I do not see any signs of infection currently which is great and overall we are finally seeing some actual decrease in the size I think that the compression wraps are making a big difference here. 12/27 some debris on the surface. No evidence of surrounding infection we have been using collagen 07/21/2021 upon evaluation today patient appears to be doing well with regard to her wound. She has been tolerating the dressing changes without complication. Fortunately I do not see any evidence of active infection locally nor systemically at this time which is great news and overall I think the wound making excellent progress here. 08/04/2021 upon evaluation today patient's wound is showing signs of being a little bit moist and really not  significantly smaller. Fortunately I do not see any signs of active infection  locally nor systemically at this time that is good news. Nonetheless I do feel like she is doing quite well otherwise and her left leg is still doing awesome. 08/11/2021 upon evaluation today patient appears to be doing well with regard to her wound. She has been tolerating the dressing changes without complication. Fortunately there does not appear to be any signs of active infection at this time. The wound appears to be less macerated though I do believe she was moistening the alginate just a little bit I told her she does not need to do that when putting on a longer she voiced understanding and will not do so any longer. 08/18/2021 upon evaluation today patient's wound appears to be showing signs of being slightly larger compared to previous. We will talk in millimeters but nonetheless this is not headed in the right direction. Subsequently I am not sure exactly what the best plan will be going forward. I think Sorbact could be a good option willing to give this a try over the next week and see how things go. Unfortunately she does have a new blister on the left heel which has arisen nothing is open and I am hoping it will reabsorb but again that may not be the case. We will have to keep a close eye on this. [name], [name] ([id number]) 2/17; right lateral malleolus which has been a difficult wound seems to be doing well. Under illumination healthy tissue this is filling in nicely using Sorbact hydrogel and a border foam dressing. Apparently last week she developed a blister on her left medial calcaneus. Most of this is already epithelialized the use Sorbact on this as well. She is developing a threatened area on the medial aspect of her left first metatarsal head this is not open but looks like there is an irritation. They have ordered their adaptive foot wear which are arriving soon Electronic Signature(s) Signed:  08/25/2021 3:30:14 PM By: Linton Ham MD Entered By: Linton Ham on 08/25/2021 15:24:15 Borowski, [name] ([id number]) -------------------------------------------------------------------------------- Physical Exam Details Patient Name: [name], [name] Date of Service: 08/25/2021 3:15 PM Medical Record Number: [id number] Patient Account Number: [id number] Date of Birth/Sex: [birth year] (88 y.o. F) Treating RN: Donnamarie Poag Primary Care Provider: Viviana Simpler Other Clinician: Referring Provider: Viviana Simpler Treating Provider/Extender: Tito Dine in Treatment: 59 Constitutional Patient is hypertensive.. Pulse regular and within target range for patient.[name] Respirations regular, non-labored and within target range.. Temperature is normal and within the target range for the patient.[name] appears in no distress. Notes Wound exam; under illumination nice granulation in the right lateral malleolus wound this is obviously filled in nicely. No debridement is necessary. There is no surrounding infection. Blister on the left lateral foot was identified last week this is mostly epithelialized. I do not expect this to be much of a problem She has a threatened area on the left first metatarsal head medial aspect I have advised to keep this protected Electronic Signature(s) Signed: 08/25/2021 3:30:14 PM By: Linton Ham MD Entered By: Linton Ham on 08/25/2021 15:27:47 Littler, [name] ([id number]) -------------------------------------------------------------------------------- Physician Orders Details Patient Name: [name], [name] Date of Service: 08/25/2021 3:15 PM Medical Record Number: [id number] Patient Account Number: [id number] Date of Birth/Sex: Mar 15, 1928 (88 y.o. F) Treating RN: Donnamarie Poag Primary Care Provider: Viviana Simpler Other Clinician: Referring Provider: Viviana Simpler Treating Provider/Extender: Tito Dine in Treatment: 100 Verbal / Phone  Orders: No Diagnosis Coding Follow-up Appointments o Return Appointment in 1 week. o Nurse  Visit as needed Bathing/ Shower/ Hygiene o May shower with wound dressing protected with water repellent cover or cast protector. o No tub bath. Anesthetic (Use 'Patient Medications' Section for Anesthetic Order Entry) o Lidocaine applied to wound bed Edema Control - Lymphedema / Segmental Compressive Device / Other o Elevate legs to the level of the heart and pump ankles as often as possible o Elevate leg(s) parallel to the floor when sitting. o DO YOUR BEST to sleep in the bed at night. DO NOT sleep in your recliner. Long hours of sitting in a recliner leads to swelling of the legs and/or potential wounds on your backside. Off-Loading o Turn and reposition every 2 hours o Other: - float heel on pillow on bed or try BUNNY BOOT order on [place] Recommend to cover the pressure area on medial left foot with a padded dressing Additional Orders / Instructions o Follow Nutritious Diet and Increase Protein Intake Wound Treatment Wound #1 - Malleolus Wound Laterality: Right, Lateral Cleanser: Soap and Water 3 x Per Week/30 Days Discharge Instructions: Gently cleanse wound with antibacterial soap, rinse and pat dry prior to dressing wounds Primary Dressing: Cutimed Sorbact 1.5x 2.38 (in/in) 3 x Per Week/30 Days Discharge Instructions: A bacteria- and fungi binding wound dressing, suitable for cavities and fistulas. It is suitable as a wound filler and allows the passage of wound exudate into a secondary dressing. The dressing helps reducing odor and pain and can improve healing. Secondary Dressing: Zetuvit Plus Silicone Border Dressing 4x4 (in/in) (Generic) 3 x Per Week/30 Days Secured With: Tubigrip Size C, 2.75x10 (in/yd) 3 x Per Week/30 Days Discharge Instructions: Apply 3 Tubigrip C 3-finger-widths below knee to base of toes to secure dressing and/or for swelling. Wound #6 -  Calcaneus Wound Laterality: Left, Medial Cleanser: Soap and Water 3 x Per Week/30 Days Discharge Instructions: Gently cleanse wound with antibacterial soap, rinse and pat dry prior to dressing wounds Primary Dressing: Cutimed Sorbact 1.5x 2.38 (in/in) 3 x Per Week/30 Days Discharge Instructions: A bacteria- and fungi binding wound dressing, suitable for cavities and fistulas. It is suitable as a wound filler and allows the passage of wound exudate into a secondary dressing. The dressing helps reducing odor and pain and can improve healing. Secondary Dressing: Zetuvit Plus Silicone Border Dressing 4x4 (in/in) (Generic) 3 x Per Week/30 Days Secured With: Tubigrip Size C, 2.75x10 (in/yd) 3 x Per Week/30 Days Discharge Instructions: Apply 3 Tubigrip C 3-finger-widths below knee to base of toes to secure dressing and/or for swelling. [name], [name] ([id number]) Electronic Signature(s) Signed: 08/25/2021 3:30:14 PM By: Linton Ham MD Signed: 08/25/2021 3:50:58 PM By: Donnamarie Poag Entered By: Donnamarie Poag on 08/25/2021 15:23:02 [name] ([id number]) -------------------------------------------------------------------------------- Problem List Details Patient Name: [name], [name] Date of Service: 08/25/2021 3:15 PM Medical Record Number: [id number] Patient Account Number: [id number] Date of Birth/Sex: [birth year] (88 y.o. F) Treating RN: Donnamarie Poag Primary Care Provider: Viviana Simpler Other Clinician: Referring Provider: Viviana Simpler Treating Provider/Extender: Tito Dine in Treatment: 44 Active Problems ICD-10 Encounter Code Description Active Date MDM Diagnosis L89.513 Pressure ulcer of right ankle, stage 3 10/21/2020 No Yes I73.89 Other specified peripheral vascular diseases 10/21/2020 No Yes L89.623 Pressure ulcer of left heel, stage 3 10/21/2020 No Yes I10 Essential (primary) hypertension 10/21/2020 No Yes I25.10 Atherosclerotic heart disease of native coronary  artery without angina 10/21/2020 No Yes pectoris Inactive Problems Resolved Problems ICD-10 Code Description Active Date Resolved Date S51.802A Unspecified open wound of left forearm, initial encounter  10/21/2020 10/21/2020 L89.312 Pressure ulcer of right buttock, stage 2 01/27/2021 01/27/2021 Electronic Signature(s) Signed: 08/25/2021 3:30:14 PM By: Linton Ham MD Entered By: Linton Ham on 08/25/2021 15:22:38 Scavone, [name] ([id number]) -------------------------------------------------------------------------------- Progress Note Details Patient Name: [name], [name] Date of Service: 08/25/2021 3:15 PM Medical Record Number: [id number] Patient Account Number: [id number] Date of Birth/Sex: 1928-05-13 (88 y.o. F) Treating RN: Donnamarie Poag Primary Care Provider: Viviana Simpler Other Clinician: Referring Provider: Viviana Simpler Treating Provider/Extender: Tito Dine in Treatment: 35 Subjective History of Present Illness (HPI) 10/21/2020 upon evaluation today patient presents for initial inspection here in our clinic concerning issues that she has been having quite some time in regard to her ankle and since she has been in the hospital with regard to the heel. The ankle in fact has been 5-6 years at least I am told. With that being said the heel ulcer occurred when she was in the hospital in December for hip surgery when she fractured her hip. She also was in the hospital Tuesday for altered mental status. Really there was nothing that was identified as the cause for this. She did have a. Fortunately there does not appear to be any signs of infection she tells me that she has had she believes arterial studies At [facility] clinic I could not find Those studies at this point. Nonetheless I will continue to look and see what I can find before Then. The patient also has a skin tear on her arm this occurred more recently when she bumped this at home. The patient does have a  history of hypertension, coronary artery disease, and peripheral vascular disease stated. 10/28/2020 I was able to find the patient's chart currently which shows that she did have an arterial study performed in May 2019. This showed that she had a abnormal right toe brachial index and a normal left toe brachial index. She was noncompressible as far as ABIs were concerned. She did appear to have triphasic flow at that time. Unfortunately the wound that is commented on in the report that I printed off and read mentions the same wound on the ankle that we are still dealing with at this point. Unfortunately this has not healed. And its been quite sometime about 3 years now. Fortunately there does not appear to be any signs of active infection systemically at this point. I think that the patient has done well with the Santyl over the past week which is good news. Patient's caregiver which is her daughter-in-law is concerned about the fact that she really does not feel qualified to be able to change the dressings and take care of this issue. There does not appear to be any signs of anything untoward going on at this point. I think she is done a great job applying the [employer] and I think that has done a great job for the patient is for soften up some of the necrotic tissue. With that being said I think the Xeroform on the arm also has done excellent. In general I am very pleased with where we stand. And I told the patient's daughter-in- law as well that I also feel like she has done a great job over the past week taking care of her mother. 11/11/2020 upon evaluation today patient appears to be doing well with regard to her wounds. She has been tolerating the dressing changes without complication her daughters been applying the Santyl which has done a great job. Ho with that being said I think  now that we have good arterial study showing we can go ahead and proceed with sharp debridement at this point. 11/25/2020  upon evaluation today patient appears to be doing well with regard to her wounds. The Santyl has really helped to clean things up and overall she is doing quite excellent at this point. Fortunately there does not appear to be any signs of infection which is great news and in general extremely pleased. I do believe some debridement is in order and hopefully will be able to get her into a collagen dressing after this. 12/23/2020 upon evaluation today patient appears to be doing well with regard to her wound all things considered. Unfortunately it does sound like her daughter decided to let this air out because she felt like it was getting so wet. It sounds like she got border gauze dressings instead of border foam therefore there is really Nothing to catch the excess drainage which I think is the problem they were worried about the smell. Fortunately there does not appear to be any signs of active infection which is great news. Nonetheless I do not think we want to leave this just open to air. 01/13/2021 upon evaluation today patient's wounds actually appear to be about as good as have seen since have been taking care of her. Fortunately there does not appear to be any evidence of infection which is great and overall the biggest issue I see is that of fluid buildup. I think we need to do something to try to help manage this. I think if we can control her edema we get the wounds to heal more effectively. 01/27/2021 upon evaluation today patient appears to be doing well in regard to the wounds on her right lateral malleolus as well as the left heel and the right ischial tuberosity is unfortunately a new area that has arisen since we last saw her. She tells me currently that that is from sitting too much. With that being said this actually appears to be doing the worst of anything so far that I see today. Fortunately there does not appear to be any signs of infection this is at least good news. Compression wrap  seem to be doing excellent for her as far as the legs are concerned. 02/03/2021 upon evaluation today patient appears to be doing well with regard to all of her wounds. She has been tolerating the dressing changes without complication. Fortunately there is no signs of active infection at this time. No fevers, chills, nausea, vomiting, or diarrhea. 02/10/2021 upon evaluation today patient appears to be doing well with regard to her wounds. With that being said there is some slight evidence of hypergranulation in regard to the right ankle in particular and a little bit in regard to the left heel. I think Hydrofera Blue might be a better option to go to at this point based on what I am seeing especially since both of these wounds in particular seem to be a little bit stalled. I discussed that with the patient and her daughter today. With regard to the hip this is doing great with the collagen I think will get very close to complete closure I recommend we continue with the collagen. 02/17/2021 upon evaluation today patient appears to be doing excellent in regard to her heel ulcers. She has been tolerating the dressing changes without complication and overall I am extremely pleased with where things stand today. There does not appear to be any signs of active infection which is great  news. Overall I may think that we are headed in the proper direction based on what I am seeing. 02/24/2021 upon evaluation today patient's wounds actually are showing signs of improvement in regard to her heels both are doing awesome. In regard to her hip location this is actually reopened after being closed last week and to be perfectly honest I am more concerned here about the fact that pressures causing this issue I do not think it has anything to do with her not having the collagen in place again it was completely healed last week there was no reason for the collagen to be there. 03/03/2021 upon evaluation today patient appears  to be doing well with regard to her wounds. Everything is showing signs of improvement and [name], [name]. ([id number]) doing some much better as far as the overall size of the wound. Fortunately I think we are headed in the right direction. 03/10/2021 upon evaluation today patient appears to be doing well with regard to her wound. She is tolerating the dressing changes without complication. The left heel pretty much appears to be almost completely healed although I think it is probably can be 1 more week before I can call this 100% slough. The right ankle is significantly improved with that being said its not completely closed. With regard to the right hip this is completely closed. Overall I am very pleased with where things stand I think were getting very close to complete resolution. I discussed all this with the patient and her daughter-in-law today. 03/17/2021 upon evaluation today patient appears to be doing well with regard to her wounds. Fortunately there does not appear to be any signs of active infection which is great news and overall very pleased with where the patient stands today. I am in general thankful that overall she is making great progress here. There is good to be a little bit of debridement here to clear away some of the necrotic debris today on both wound locations. 03/24/2021 upon evaluation today patient appears to be doing excellent in regard to her wound. She has been tolerating the dressing changes without complication. Fortunately there does not appear to be any evidence of infection the left foot is completely healed this is great news. On the right at the ankle region this is still open though I do not think the alginate did quite as well as I was hoping as far as getting this dried out. I think that we may just want to switch back to the [facility] which has done well up to this point. I was just hopeful this would wrap things up completely for [place]. 03/31/2021 upon  evaluation today patient appears to be doing well with regard to her wound. This is on the right side which appears to be doing better than last week I think is definitely measuring smaller and this is great news. Overall I am very pleased with where we stand today. I do believe the [facility] is doing better for her. 04/14/2021 upon evaluation today patient appears to be doing well with regard to her wound. I am very pleased with the way it stands I think that she is headed in the right direction. Unfortunately she is having issues with the Tubigrip neither she nor her daughter-in-law who is the primary caregiver can get this on. Subsequently that means that they have been having a very difficult time with complying with the necessary things for this left leg. With that being said I am really  not sure what to do to help in that regard. We can always try a bigger that is larger size Tubigrip but at the same time that means that she may not get as good compression and it may not keep things under control the only way to know is to try. 04/21/2021 upon evaluation today patient appears to be doing well in regard to her wound. She has been tolerating the dressing changes without complication and in fact the right ankle ulcer is actually showing signs of excellent improvement I am very pleased with where things stand today. No fevers, chills, nausea, vomiting, or diarrhea. 04/28/2021 upon evaluation today patient appears to be doing well with regard to her ankle ulcer. This is actually showing signs of good improvement which is great news and overall very pleased with where we stand today. No fevers, chills, nausea, vomiting, or diarrhea. 05/05/2021 upon evaluation today patient's wound is actually showing signs of significant improvement and overall I am extremely pleased with where we stand today. There does not appear to be any signs of active infection which is great news and overall I am extremely  pleased with where we stand at this point. No fevers, chills, nausea, vomiting, or diarrhea. 05/12/2021 upon evaluation today patient appears to be doing okay in regard to her wound is measuring a little bit smaller today that is good news. With that being said she still continues to have significant issues here with an open wound on the lateral aspect of her ankle. Fortunately there does not appear to be any signs of active infection systemically which is great news. No fevers, chills, nausea, vomiting, or diarrhea. 06/13/2021 upon evaluation today patient unfortunately continues to have issues with her right leg. Specifically the ankle region where there is an open wound. Previously the wound on the left leg had completely closed. Nonetheless this left heel has reopened as well. I am going to perform some debridement in regard to the heel ulcer appears to be a fluid-filled blister underneath this area. She has not been here for such a long time due to the fact that she was very sick and they were not able to bring her out. That is the reason we have not seen her in over the past month. 06/20/2021 upon evaluation patient's wounds currently are doing well on the left heel this appears to be healed. There does not appear to be any signs of drainage at this time which is great news. No fevers, chills, nausea, vomiting, or diarrhea. On the right heel there is still an open wound but I feel like we are showing some signs of improvement this is still good to take a bit of time. 06/27/2021 upon evaluation today patient appears to be doing better in regard to her wound. This is actually showing signs of good improvement which is great news. Fortunately I do not see any signs of infection currently which is great and overall we are finally seeing some actual decrease in the size I think that the compression wraps are making a big difference here. 12/27 some debris on the surface. No evidence of surrounding  infection we have been using collagen 07/21/2021 upon evaluation today patient appears to be doing well with regard to her wound. She has been tolerating the dressing changes without complication. Fortunately I do not see any evidence of active infection locally nor systemically at this time which is great news and overall I think the wound making excellent progress here. 08/04/2021 upon evaluation  today patient's wound is showing signs of being a little bit moist and really not significantly smaller. Fortunately I do not see any signs of active infection locally nor systemically at this time that is good news. Nonetheless I do feel like she is doing quite well otherwise and her left leg is still doing awesome. 08/11/2021 upon evaluation today patient appears to be doing well with regard to her wound. She has been tolerating the dressing changes without complication. Fortunately there does not appear to be any signs of active infection at this time. The wound appears to be less macerated though I do believe she was moistening the alginate just a little bit I told her she does not need to do that when putting on a longer she voiced understanding and will not do so any longer. 08/18/2021 upon evaluation today patient's wound appears to be showing signs of being slightly larger compared to previous. We will talk in millimeters but nonetheless this is not headed in the right direction. Subsequently I am not sure exactly what the best plan will be going forward. I think Sorbact could be a good option willing to give this a try over the next week and see how things go. Unfortunately she does have a new blister on the left heel which has arisen nothing is open and I am hoping it will reabsorb but again that may not be the case. We will have to keep a close eye on this. 2/17; right lateral malleolus which has been a difficult wound seems to be doing well. Under illumination healthy tissue this is filling in  nicely using Sorbact hydrogel and a border foam dressing. [name] ([id number]) Apparently last week she developed a blister on her left medial calcaneus. Most of this is already epithelialized the use Sorbact on this as well. She is developing a threatened area on the medial aspect of her left first metatarsal head this is not open but looks like there is an irritation. They have ordered their adaptive foot wear which are arriving soon Objective Constitutional Patient is hypertensive.. Pulse regular and within target range for patient.[name] Respirations regular, non-labored and within target range.. Temperature is normal and within the target range for the patient.[name] appears in no distress. Vitals Time Taken: 3:05 PM, Height: 66 in, Weight: 76 lbs, BMI: 12.3, Temperature: 98.1 F, Pulse: 74 bpm, Respiratory Rate: 16 breaths/min, Blood Pressure: 155/85 mmHg. General Notes: Wound exam; under illumination nice granulation in the right lateral malleolus wound this is obviously filled in nicely. No debridement is necessary. There is no surrounding infection. Blister on the left lateral foot was identified last week this is mostly epithelialized. I do not expect this to be much of a problem She has a threatened area on the left first metatarsal head medial aspect I have advised to keep this protected Integumentary (Hair, Skin) Wound #1 status is Open. Original cause of wound was Gradually Appeared. The date acquired was: 10/22/2014. The wound has been in treatment 44 weeks. The wound is located on the Right,Lateral Malleolus. The wound measures 0.8cm length x 0.8cm width x 0.2cm depth; 0.503cm^2 area and 0.101cm^3 volume. There is Fat Layer (Subcutaneous Tissue) exposed. There is no tunneling or undermining noted. There is a large amount of serosanguineous drainage noted. The wound margin is flat and intact. There is small (1-33%) pink granulation within the wound bed. There is a large (67-100%)  amount of necrotic tissue within the wound bed including Adherent Slough. Wound #6 status  is Open. Original cause of wound was Blister. The date acquired was: 08/18/2021. The wound has been in treatment 1 weeks. The wound is located on the Left,Medial Calcaneus. The wound measures 2cm length x 2cm width x 0.1cm depth; 3.142cm^2 area and 0.314cm^3 volume. The wound is limited to skin breakdown. There is no tunneling or undermining noted. There is a medium amount of serous drainage noted. The wound margin is flat and intact. There is large (67-100%) red, pink granulation within the wound bed. There is no necrotic tissue within the wound bed. Assessment Active Problems ICD-10 Pressure ulcer of right ankle, stage 3 Other specified peripheral vascular diseases Pressure ulcer of left heel, stage 3 Essential (primary) hypertension Atherosclerotic heart disease of native coronary artery without angina pectoris Plan Follow-up Appointments: Return Appointment in 1 week. Nurse Visit as needed Bathing/ Shower/ Hygiene: May shower with wound dressing protected with water repellent cover or cast protector. No tub bath. Anesthetic (Use 'Patient Medications' Section for Anesthetic Order Entry): Lidocaine applied to wound bed Edema Control - Lymphedema / Segmental Compressive Device / Other: Elevate legs to the level of the heart and pump ankles as often as possible Elevate leg(s) parallel to the floor when sitting. DO YOUR BEST to sleep in the bed at night. DO NOT sleep in your recliner. Long hours of sitting in a recliner leads to swelling of the legs and/or [name], [name]. ([id number]) potential wounds on your backside. Off-Loading: Turn and reposition every 2 hours Other: - float heel on pillow on bed or try BUNNY BOOT order on [place] to cover the pressure area on medial left foot with a padded dressing Additional Orders / Instructions: Follow Nutritious Diet and Increase Protein  Intake WOUND #1: - Malleolus Wound Laterality: Right, Lateral Cleanser: Soap and Water 3 x Per Week/30 Days Discharge Instructions: Gently cleanse wound with antibacterial soap, rinse and pat dry prior to dressing wounds Primary Dressing: Cutimed Sorbact 1.5x 2.38 (in/in) 3 x Per Week/30 Days Discharge Instructions: A bacteria- and fungi binding wound dressing, suitable for cavities and fistulas. It is suitable as a wound filler and allows the passage of wound exudate into a secondary dressing. The dressing helps reducing odor and pain and can improve healing. Secondary Dressing: Zetuvit Plus Silicone Border Dressing 4x4 (in/in) (Generic) 3 x Per Week/30 Days Secured With: Tubigrip Size C, 2.75x10 (in/yd) 3 x Per Week/30 Days Discharge Instructions: Apply 3 Tubigrip C 3-finger-widths below knee to base of toes to secure dressing and/or for swelling. WOUND #6: - Calcaneus Wound Laterality: Left, Medial Cleanser: Soap and Water 3 x Per Week/30 Days Discharge Instructions: Gently cleanse wound with antibacterial soap, rinse and pat dry prior to dressing wounds Primary Dressing: Cutimed Sorbact 1.5x 2.38 (in/in) 3 x Per Week/30 Days Discharge Instructions: A bacteria- and fungi binding wound dressing, suitable for cavities and fistulas. It is suitable as a wound filler and allows the passage of wound exudate into a secondary dressing. The dressing helps reducing odor and pain and can improve healing. Secondary Dressing: Zetuvit Plus Silicone Border Dressing 4x4 (in/in) (Generic) 3 x Per Week/30 Days Secured With: Tubigrip Size C, 2.75x10 (in/yd) 3 x Per Week/30 Days Discharge Instructions: Apply 3 Tubigrip C 3-finger-widths below knee to base of toes to secure dressing and/or for swelling. 1. I have continued the Sorbact hydrogel with bordered foam. 2. I am not sure how she is managing to damage so many areas on her feet. They have ordered her adaptive footwear which is coming  soon. I have also  asked them to keep their feet protected at night may be on a pillow. Electronic Signature(s) Signed: 08/25/2021 3:30:14 PM By: Linton Ham MD Entered By: Linton Ham on 08/25/2021 15:28:54 [name] ([id number]) -------------------------------------------------------------------------------- SuperBill Details Patient Name: [name], [name] Date of Service: 08/25/2021 Medical Record Number: [id number] Patient Account Number: [id number] Date of Birth/Sex: 02-Mar-1928 (88 y.o. F) Treating RN: Donnamarie Poag Primary Care Provider: Viviana Simpler Other Clinician: Referring Provider: Viviana Simpler Treating Provider/Extender: Tito Dine in Treatment: 65 Diagnosis Coding ICD-10 Codes Code Description L89.513 Pressure ulcer of right ankle, stage 3 I73.89 Other specified peripheral vascular diseases L89.623 Pressure ulcer of left heel, stage 3 I10 Essential (primary) hypertension I25.10 Atherosclerotic heart disease of native coronary artery without angina pectoris Facility Procedures CPT4 Code: [id number] Description: 99213 - WOUND CARE VISIT-LEV 3 EST PT Modifier: Quantity: 1 Physician Procedures CPT4 Code: [id number] Description: 99213 - WC PHYS LEVEL 3 - EST PT Modifier: Quantity: 1 CPT4 Code: Description: ICD-10 Diagnosis Description L89.513 Pressure ulcer of right ankle, stage 3 L89.623 Pressure ulcer of left heel, stage 3 Modifier: Quantity: Electronic Signature(s) Signed: 08/25/2021 3:50:58 PM By: Donnamarie Poag Previous Signature: 08/25/2021 3:30:14 PM Version By: Linton Ham MD Entered By: Donnamarie Poag on 08/25/2021 15:33:46

## 2021-09-01 ENCOUNTER — Other Ambulatory Visit: Payer: Self-pay

## 2021-09-01 ENCOUNTER — Encounter: Payer: PPO | Admitting: Physician Assistant

## 2021-09-01 DIAGNOSIS — I70233 Atherosclerosis of native arteries of right leg with ulceration of ankle: Secondary | ICD-10-CM | POA: Diagnosis not present

## 2021-09-01 DIAGNOSIS — L89513 Pressure ulcer of right ankle, stage 3: Secondary | ICD-10-CM | POA: Diagnosis not present

## 2021-09-01 NOTE — Progress Notes (Addendum)
[name], [name] ([id number]) Visit Report for 09/01/2021 Chief Complaint Document Details Patient Name: [name], [name]. Date of Service: 09/01/2021 3:15 PM Medical Record Number: [id number] Patient Account Number: [id number] Date of Birth/Sex: 1928/06/17 (88 y.o. F) Treating RN: Donnamarie Poag Primary Care Provider: Viviana Simpler Other Clinician: Referring Provider: Viviana Simpler Treating Provider/Extender: Skipper Cliche in Treatment: 56 Information Obtained from: Patient Chief Complaint Pressure ulcer right ankle and left heel Electronic Signature(s) Signed: 09/01/2021 2:44:08 PM By: Worthy Keeler PA-C Entered By: Worthy Keeler on 09/01/2021 14:44:07 Loftus, [name] ([id number]) -------------------------------------------------------------------------------- Debridement Details Patient Name: [name] Date of Service: 09/01/2021 3:15 PM Medical Record Number: [id number] Patient Account Number: [id number] Date of Birth/Sex: 1928-02-11 (88 y.o. F) Treating RN: Donnamarie Poag Primary Care Provider: Viviana Simpler Other Clinician: Referring Provider: Viviana Simpler Treating Provider/Extender: Skipper Cliche in Treatment: 30 Debridement Performed for Wound #1 Right,Lateral Malleolus Assessment: Performed By: Physician Tommie Sams., PA-C Debridement Type: Debridement Level of Consciousness (Pre- Awake and Alert procedure): Pre-procedure Verification/Time Out Yes - 15:44 Taken: Start Time: 15:45 Pain Control: Lidocaine Total Area Debrided (L x W): 0.7 (cm) x 0.8 (cm) = 0.56 (cm) Tissue and other material Viable, Non-Viable, Slough, Subcutaneous, Slough debrided: Level: Skin/Subcutaneous Tissue Debridement Description: Excisional Instrument: Curette Bleeding: Minimum Hemostasis Achieved: Pressure Response to Treatment: Procedure was tolerated well Level of Consciousness (Post- Awake and Alert procedure): Post Debridement Measurements of Total  Wound Length: (cm) 0.7 Stage: Category/Stage III Width: (cm) 0.8 Depth: (cm) 0.2 Volume: (cm) 0.088 Character of Wound/Ulcer Post Debridement: Improved Post Procedure Diagnosis Same as Pre-procedure Electronic Signature(s) Signed: 09/01/2021 4:05:24 PM By: Worthy Keeler PA-C Signed: 09/01/2021 4:55:51 PM By: Donnamarie Poag Entered By: Donnamarie Poag on 09/01/2021 15:48:24 Schuessler, [name] ([id number]) -------------------------------------------------------------------------------- HPI Details Patient Name: [name], [name] Date of Service: 09/01/2021 3:15 PM Medical Record Number: [id number] Patient Account Number: [id number] Date of Birth/Sex: 08/22/1927 (88 y.o. F) Treating RN: Donnamarie Poag Primary Care Provider: Viviana Simpler Other Clinician: Referring Provider: Viviana Simpler Treating Provider/Extender: Skipper Cliche in Treatment: 45 History of Present Illness HPI Description: 10/21/2020 upon evaluation today patient presents for initial inspection here in our clinic concerning issues that she has been having quite some time in regard to her ankle and since she has been in the hospital with regard to the heel. The ankle in fact has been 5-6 years at least I am told. With that being said the heel ulcer occurred when she was in the hospital in December for hip surgery when she fractured her hip. She also was in the hospital Tuesday for altered mental status. Really there was nothing that was identified as the cause for this. She did have a. Fortunately there does not appear to be any signs of infection she tells me that she has had she believes arterial studies At [facility] clinic I could not find Those studies at this point. Nonetheless I will continue to look and see what I can find before Then. The patient also has a skin tear on her arm this occurred more recently when she bumped this at home. The patient does have a history of hypertension, coronary artery disease, and peripheral  vascular disease stated. 10/28/2020 I was able to find the patient's chart currently which shows that she did have an arterial study performed in May 2019. This showed that she had a abnormal right toe brachial index and a normal left toe brachial index. She was noncompressible as far as ABIs were  concerned. She did appear to have triphasic flow at that time. Unfortunately the wound that is commented on in the report that I printed off and read mentions the same wound on the ankle that we are still dealing with at this point. Unfortunately this has not healed. And its been quite sometime about 3 years now. Fortunately there does not appear to be any signs of active infection systemically at this point. I think that the patient has done well with the Santyl over the past week which is good news. Patient's caregiver which is her daughter-in-law is concerned about the fact that she really does not feel qualified to be able to change the dressings and take care of this issue. There does not appear to be any signs of anything untoward going on at this point. I think she is done a great job applying the [employer] and I think that has done a great job for the patient is for soften up some of the necrotic tissue. With that being said I think the Xeroform on the arm also has done excellent. In general I am very pleased with where we stand. And I told the patient's daughter-in- law as well that I also feel like she has done a great job over the past week taking care of her mother. 11/11/2020 upon evaluation today patient appears to be doing well with regard to her wounds. She has been tolerating the dressing changes without complication her daughters been applying the Santyl which has done a great job. Ho with that being said I think now that we have good arterial study showing we can go ahead and proceed with sharp debridement at this point. 11/25/2020 upon evaluation today patient appears to be doing well with  regard to her wounds. The Santyl has really helped to clean things up and overall she is doing quite excellent at this point. Fortunately there does not appear to be any signs of infection which is great news and in general extremely pleased. I do believe some debridement is in order and hopefully will be able to get her into a collagen dressing after this. 12/23/2020 upon evaluation today patient appears to be doing well with regard to her wound all things considered. Unfortunately it does sound like her daughter decided to let this air out because she felt like it was getting so wet. It sounds like she got border gauze dressings instead of border foam therefore there is really Nothing to catch the excess drainage which I think is the problem they were worried about the smell. Fortunately there does not appear to be any signs of active infection which is great news. Nonetheless I do not think we want to leave this just open to air. 01/13/2021 upon evaluation today patient's wounds actually appear to be about as good as have seen since have been taking care of her. Fortunately there does not appear to be any evidence of infection which is great and overall the biggest issue I see is that of fluid buildup. I think we need to do something to try to help manage this. I think if we can control her edema we get the wounds to heal more effectively. 01/27/2021 upon evaluation today patient appears to be doing well in regard to the wounds on her right lateral malleolus as well as the left heel and the right ischial tuberosity is unfortunately a new area that has arisen since we last saw her. She tells me currently that that is from sitting  too much. With that being said this actually appears to be doing the worst of anything so far that I see today. Fortunately there does not appear to be any signs of infection this is at least good news. Compression wrap seem to be doing excellent for her as far as the legs are  concerned. 02/03/2021 upon evaluation today patient appears to be doing well with regard to all of her wounds. She has been tolerating the dressing changes without complication. Fortunately there is no signs of active infection at this time. No fevers, chills, nausea, vomiting, or diarrhea. 02/10/2021 upon evaluation today patient appears to be doing well with regard to her wounds. With that being said there is some slight evidence of hypergranulation in regard to the right ankle in particular and a little bit in regard to the left heel. I think Hydrofera Blue might be a better option to go to at this point based on what I am seeing especially since both of these wounds in particular seem to be a little bit stalled. I discussed that with the patient and her daughter today. With regard to the hip this is doing great with the collagen I think will get very close to complete closure I recommend we continue with the collagen. 02/17/2021 upon evaluation today patient appears to be doing excellent in regard to her heel ulcers. She has been tolerating the dressing changes without complication and overall I am extremely pleased with where things stand today. There does not appear to be any signs of active infection which is great news. Overall I may think that we are headed in the proper direction based on what I am seeing. 02/24/2021 upon evaluation today patient's wounds actually are showing signs of improvement in regard to her heels both are doing awesome. In regard to her hip location this is actually reopened after being closed last week and to be perfectly honest I am more concerned here about the fact that pressures causing this issue I do not think it has anything to do with her not having the collagen in place again it was completely healed last week there was no reason for the collagen to be there. 03/03/2021 upon evaluation today patient appears to be doing well with regard to her wounds. Everything is  showing signs of improvement and [name], [name]. ([id number]) doing some much better as far as the overall size of the wound. Fortunately I think we are headed in the right direction. 03/10/2021 upon evaluation today patient appears to be doing well with regard to her wound. She is tolerating the dressing changes without complication. The left heel pretty much appears to be almost completely healed although I think it is probably can be 1 more week before I can call this 100% slough. The right ankle is significantly improved with that being said its not completely closed. With regard to the right hip this is completely closed. Overall I am very pleased with where things stand I think were getting very close to complete resolution. I discussed all this with the patient and her daughter-in-law today. 03/17/2021 upon evaluation today patient appears to be doing well with regard to her wounds. Fortunately there does not appear to be any signs of active infection which is great news and overall very pleased with where the patient stands today. I am in general thankful that overall she is making great progress here. There is good to be a little bit of debridement here to clear away some of  the necrotic debris today on both wound locations. 03/24/2021 upon evaluation today patient appears to be doing excellent in regard to her wound. She has been tolerating the dressing changes without complication. Fortunately there does not appear to be any evidence of infection the left foot is completely healed this is great news. On the right at the ankle region this is still open though I do not think the alginate did quite as well as I was hoping as far as getting this dried out. I think that we may just want to switch back to the [facility] which has done well up to this point. I was just hopeful this would wrap things up completely for [place]. 03/31/2021 upon evaluation today patient appears to be doing well with regard  to her wound. This is on the right side which appears to be doing better than last week I think is definitely measuring smaller and this is great news. Overall I am very pleased with where we stand today. I do believe the [facility] is doing better for her. 04/14/2021 upon evaluation today patient appears to be doing well with regard to her wound. I am very pleased with the way it stands I think that she is headed in the right direction. Unfortunately she is having issues with the Tubigrip neither she nor her daughter-in-law who is the primary caregiver can get this on. Subsequently that means that they have been having a very difficult time with complying with the necessary things for this left leg. With that being said I am really not sure what to do to help in that regard. We can always try a bigger that is larger size Tubigrip but at the same time that means that she may not get as good compression and it may not keep things under control the only way to know is to try. 04/21/2021 upon evaluation today patient appears to be doing well in regard to her wound. She has been tolerating the dressing changes without complication and in fact the right ankle ulcer is actually showing signs of excellent improvement I am very pleased with where things stand today. No fevers, chills, nausea, vomiting, or diarrhea. 04/28/2021 upon evaluation today patient appears to be doing well with regard to her ankle ulcer. This is actually showing signs of good improvement which is great news and overall very pleased with where we stand today. No fevers, chills, nausea, vomiting, or diarrhea. 05/05/2021 upon evaluation today patient's wound is actually showing signs of significant improvement and overall I am extremely pleased with where we stand today. There does not appear to be any signs of active infection which is great news and overall I am extremely pleased with where we stand at this point. No fevers, chills,  nausea, vomiting, or diarrhea. 05/12/2021 upon evaluation today patient appears to be doing okay in regard to her wound is measuring a little bit smaller today that is good news. With that being said she still continues to have significant issues here with an open wound on the lateral aspect of her ankle. Fortunately there does not appear to be any signs of active infection systemically which is great news. No fevers, chills, nausea, vomiting, or diarrhea. 06/13/2021 upon evaluation today patient unfortunately continues to have issues with her right leg. Specifically the ankle region where there is an open wound. Previously the wound on the left leg had completely closed. Nonetheless this left heel has reopened as well. I am going to perform some debridement  in regard to the heel ulcer appears to be a fluid-filled blister underneath this area. She has not been here for such a long time due to the fact that she was very sick and they were not able to bring her out. That is the reason we have not seen her in over the past month. 06/20/2021 upon evaluation patient's wounds currently are doing well on the left heel this appears to be healed. There does not appear to be any signs of drainage at this time which is great news. No fevers, chills, nausea, vomiting, or diarrhea. On the right heel there is still an open wound but I feel like we are showing some signs of improvement this is still good to take a bit of time. 06/27/2021 upon evaluation today patient appears to be doing better in regard to her wound. This is actually showing signs of good improvement which is great news. Fortunately I do not see any signs of infection currently which is great and overall we are finally seeing some actual decrease in the size I think that the compression wraps are making a big difference here. 12/27 some debris on the surface. No evidence of surrounding infection we have been using collagen 07/21/2021 upon evaluation  today patient appears to be doing well with regard to her wound. She has been tolerating the dressing changes without complication. Fortunately I do not see any evidence of active infection locally nor systemically at this time which is great news and overall I think the wound making excellent progress here. 08/04/2021 upon evaluation today patient's wound is showing signs of being a little bit moist and really not significantly smaller. Fortunately I do not see any signs of active infection locally nor systemically at this time that is good news. Nonetheless I do feel like she is doing quite well otherwise and her left leg is still doing awesome. 08/11/2021 upon evaluation today patient appears to be doing well with regard to her wound. She has been tolerating the dressing changes without complication. Fortunately there does not appear to be any signs of active infection at this time. The wound appears to be less macerated though I do believe she was moistening the alginate just a little bit I told her she does not need to do that when putting on a longer she voiced understanding and will not do so any longer. 08/18/2021 upon evaluation today patient's wound appears to be showing signs of being slightly larger compared to previous. We will talk in millimeters but nonetheless this is not headed in the right direction. Subsequently I am not sure exactly what the best plan will be going forward. I think Sorbact could be a good option willing to give this a try over the next week and see how things go. Unfortunately she does have a new blister on the left heel which has arisen nothing is open and I am hoping it will reabsorb but again that may not be the case. We will have to keep a close eye on this. 2/17; right lateral malleolus which has been a difficult wound seems to be doing well. Under illumination healthy tissue this is filling in nicely using Sorbact hydrogel and a border foam dressing. [name] ([id number]) Apparently last week she developed a blister on her left medial calcaneus. Most of this is already epithelialized the use Sorbact on this as well. She is developing a threatened area on the medial aspect of her left first metatarsal head this is  not open but looks like there is an irritation. They have ordered their adaptive foot wear which are arriving soon 09/01/2021 upon evaluation today patient appears to be doing very well in regard to her wounds in general. I do feel like that she is making excellent progress and overall I am extremely pleased with where we stand today. Fortunately there does not appear to be any evidence of active infection locally nor systemically at this time. Fortunately I do not see any evidence of active infection which is also. Electronic Signature(s) Signed: 09/01/2021 3:54:57 PM By: Worthy Keeler PA-C Entered By: Worthy Keeler on 09/01/2021 15:54:57 [name], [name] ([id number]) -------------------------------------------------------------------------------- Physical Exam Details Patient Name: [name], [name] Date of Service: 09/01/2021 3:15 PM Medical Record Number: [id number] Patient Account Number: [id number] Date of Birth/Sex: 09-19-1927 (88 y.o. F) Treating RN: Donnamarie Poag Primary Care Provider: Viviana Simpler Other Clinician: Referring Provider: Viviana Simpler Treating Provider/Extender: Skipper Cliche in Treatment: 56 Constitutional Well-nourished and well-hydrated in no acute distress. Respiratory normal breathing without difficulty. Psychiatric this patient is able to make decisions and demonstrates good insight into disease process. Alert and Oriented x 3. pleasant and cooperative. Notes Upon inspection patient's wound bed actually showed signs of good granulation and epithelization at this point. Fortunately I do not see any evidence of active infection locally or systemically which is great news and overall I am  extremely pleased with where we stand today. Electronic Signature(s) Signed: 09/01/2021 3:55:13 PM By: Worthy Keeler PA-C Entered By: Worthy Keeler on 09/01/2021 15:55:13 [name], [name] ([id number]) -------------------------------------------------------------------------------- Physician Orders Details Patient Name: [name], [name] Date of Service: 09/01/2021 3:15 PM Medical Record Number: [id number] Patient Account Number: [id number] Date of Birth/Sex: 11/21/1927 (88 y.o. F) Treating RN: Donnamarie Poag Primary Care Provider: Viviana Simpler Other Clinician: Referring Provider: Viviana Simpler Treating Provider/Extender: Skipper Cliche in Treatment: 49 Verbal / Phone Orders: No Diagnosis Coding ICD-10 Coding Code Description L89.513 Pressure ulcer of right ankle, stage 3 I73.89 Other specified peripheral vascular diseases L89.623 Pressure ulcer of left heel, stage 3 I10 Essential (primary) hypertension I25.10 Atherosclerotic heart disease of native coronary artery without angina pectoris Follow-up Appointments o Return Appointment in 1 week. o Nurse Visit as needed Bathing/ Shower/ Hygiene o May shower with wound dressing protected with water repellent cover or cast protector. o No tub bath. Anesthetic (Use 'Patient Medications' Section for Anesthetic Order Entry) o Lidocaine applied to wound bed Edema Control - Lymphedema / Segmental Compressive Device / Other o Elevate legs to the level of the heart and pump ankles as often as possible o Elevate leg(s) parallel to the floor when sitting. o DO YOUR BEST to sleep in the bed at night. DO NOT sleep in your recliner. Long hours of sitting in a recliner leads to swelling of the legs and/or potential wounds on your backside. Off-Loading o Turn and reposition every 2 hours o Other: - float heel on pillow on bed or try BUNNY BOOT order on [place] Recommend to cover the pressure area on medial left foot with a  padded dressing Additional Orders / Instructions o Follow Nutritious Diet and Increase Protein Intake Wound Treatment Wound #1 - Malleolus Wound Laterality: Right, Lateral Cleanser: Soap and Water 3 x Per Week/30 Days Discharge Instructions: Gently cleanse wound with antibacterial soap, rinse and pat dry prior to dressing wounds Primary Dressing: Cutimed Sorbact 1.5x 2.38 (in/in) 3 x Per Week/30 Days Discharge Instructions: A bacteria- and fungi binding wound dressing, suitable  for cavities and fistulas. It is suitable as a wound filler and allows the passage of wound exudate into a secondary dressing. The dressing helps reducing odor and pain and can improve healing. Secondary Dressing: Gauze 3 x Per Week/30 Days Discharge Instructions: dry gauze under BF Secondary Dressing: Zetuvit Plus Silicone Border Dressing 4x4 (in/in) (Generic) 3 x Per Week/30 Days Secured With: Tubigrip Size C, 2.75x10 (in/yd) 3 x Per Week/30 Days Discharge Instructions: Apply 3 Tubigrip C 3-finger-widths below knee to base of toes to secure dressing and/or for swelling. Wound #6 - Calcaneus Wound Laterality: Left, Medial Cleanser: Soap and Water 3 x Per Week/30 Days [name], [name] ([id number]) Discharge Instructions: Gently cleanse wound with antibacterial soap, rinse and pat dry prior to dressing wounds Primary Dressing: Cutimed Sorbact 1.5x 2.38 (in/in) 3 x Per Week/30 Days Discharge Instructions: A bacteria- and fungi binding wound dressing, suitable for cavities and fistulas. It is suitable as a wound filler and allows the passage of wound exudate into a secondary dressing. The dressing helps reducing odor and pain and can improve healing. Secondary Dressing: Gauze 3 x Per Week/30 Days Discharge Instructions: dry gauze under BF Secondary Dressing: Zetuvit Plus Silicone Border Dressing 4x4 (in/in) (Generic) 3 x Per Week/30 Days Secured With: Tubigrip Size C, 2.75x10 (in/yd) 3 x Per Week/30 Days Discharge  Instructions: Apply 3 Tubigrip C 3-finger-widths below knee to base of toes to secure dressing and/or for swelling. Electronic Signature(s) Signed: 09/01/2021 4:05:24 PM By: Worthy Keeler PA-C Signed: 09/01/2021 4:55:51 PM By: Donnamarie Poag Entered By: Donnamarie Poag on 09/01/2021 15:49:35 [name], [name] ([id number]) -------------------------------------------------------------------------------- Problem List Details Patient Name: [name], [name] Date of Service: 09/01/2021 3:15 PM Medical Record Number: [id number] Patient Account Number: [id number] Date of Birth/Sex: May 10, 1928 (88 y.o. F) Treating RN: Donnamarie Poag Primary Care Provider: Viviana Simpler Other Clinician: Referring Provider: Viviana Simpler Treating Provider/Extender: Skipper Cliche in Treatment: 53 Active Problems ICD-10 Encounter Code Description Active Date MDM Diagnosis L89.513 Pressure ulcer of right ankle, stage 3 10/21/2020 No Yes I73.89 Other specified peripheral vascular diseases 10/21/2020 No Yes L89.623 Pressure ulcer of left heel, stage 3 10/21/2020 No Yes I10 Essential (primary) hypertension 10/21/2020 No Yes I25.10 Atherosclerotic heart disease of native coronary artery without angina 10/21/2020 No Yes pectoris Inactive Problems Resolved Problems ICD-10 Code Description Active Date Resolved Date S51.802A Unspecified open wound of left forearm, initial encounter 10/21/2020 10/21/2020 L89.312 Pressure ulcer of right buttock, stage 2 01/27/2021 01/27/2021 Electronic Signature(s) Signed: 09/01/2021 2:44:02 PM By: Worthy Keeler PA-C Entered By: Worthy Keeler on 09/01/2021 14:44:02 [name], [name] ([id number]) -------------------------------------------------------------------------------- Progress Note Details Patient Name: [name] Date of Service: 09/01/2021 3:15 PM Medical Record Number: [id number] Patient Account Number: [id number] Date of Birth/Sex: 1928-01-31 (88 y.o. F) Treating RN: Donnamarie Poag Primary Care Provider: Viviana Simpler Other Clinician: Referring Provider: Viviana Simpler Treating Provider/Extender: Skipper Cliche in Treatment: 43 Subjective Chief Complaint Information obtained from Patient Pressure ulcer right ankle and left heel History of Present Illness (HPI) 10/21/2020 upon evaluation today patient presents for initial inspection here in our clinic concerning issues that she has been having quite some time in regard to her ankle and since she has been in the hospital with regard to the heel. The ankle in fact has been 5-6 years at least I am told. With that being said the heel ulcer occurred when she was in the hospital in December for hip surgery when she fractured her hip. She also was in  the hospital Tuesday for altered mental status. Really there was nothing that was identified as the cause for this. She did have a. Fortunately there does not appear to be any signs of infection she tells me that she has had she believes arterial studies At [facility] clinic I could not find Those studies at this point. Nonetheless I will continue to look and see what I can find before Then. The patient also has a skin tear on her arm this occurred more recently when she bumped this at home. The patient does have a history of hypertension, coronary artery disease, and peripheral vascular disease stated. 10/28/2020 I was able to find the patient's chart currently which shows that she did have an arterial study performed in May 2019. This showed that she had a abnormal right toe brachial index and a normal left toe brachial index. She was noncompressible as far as ABIs were concerned. She did appear to have triphasic flow at that time. Unfortunately the wound that is commented on in the report that I printed off and read mentions the same wound on the ankle that we are still dealing with at this point. Unfortunately this has not healed. And its been quite sometime about 3 years  now. Fortunately there does not appear to be any signs of active infection systemically at this point. I think that the patient has done well with the Santyl over the past week which is good news. Patient's caregiver which is her daughter-in-law is concerned about the fact that she really does not feel qualified to be able to change the dressings and take care of this issue. There does not appear to be any signs of anything untoward going on at this point. I think she is done a great job applying the [employer] and I think that has done a great job for the patient is for soften up some of the necrotic tissue. With that being said I think the Xeroform on the arm also has done excellent. In general I am very pleased with where we stand. And I told the patient's daughter-in- law as well that I also feel like she has done a great job over the past week taking care of her mother. 11/11/2020 upon evaluation today patient appears to be doing well with regard to her wounds. She has been tolerating the dressing changes without complication her daughters been applying the Santyl which has done a great job. Ho with that being said I think now that we have good arterial study showing we can go ahead and proceed with sharp debridement at this point. 11/25/2020 upon evaluation today patient appears to be doing well with regard to her wounds. The Santyl has really helped to clean things up and overall she is doing quite excellent at this point. Fortunately there does not appear to be any signs of infection which is great news and in general extremely pleased. I do believe some debridement is in order and hopefully will be able to get her into a collagen dressing after this. 12/23/2020 upon evaluation today patient appears to be doing well with regard to her wound all things considered. Unfortunately it does sound like her daughter decided to let this air out because she felt like it was getting so wet. It sounds like she got  border gauze dressings instead of border foam therefore there is really Nothing to catch the excess drainage which I think is the problem they were worried about the smell. Fortunately there does not  appear to be any signs of active infection which is great news. Nonetheless I do not think we want to leave this just open to air. 01/13/2021 upon evaluation today patient's wounds actually appear to be about as good as have seen since have been taking care of her. Fortunately there does not appear to be any evidence of infection which is great and overall the biggest issue I see is that of fluid buildup. I think we need to do something to try to help manage this. I think if we can control her edema we get the wounds to heal more effectively. 01/27/2021 upon evaluation today patient appears to be doing well in regard to the wounds on her right lateral malleolus as well as the left heel and the right ischial tuberosity is unfortunately a new area that has arisen since we last saw her. She tells me currently that that is from sitting too much. With that being said this actually appears to be doing the worst of anything so far that I see today. Fortunately there does not appear to be any signs of infection this is at least good news. Compression wrap seem to be doing excellent for her as far as the legs are concerned. 02/03/2021 upon evaluation today patient appears to be doing well with regard to all of her wounds. She has been tolerating the dressing changes without complication. Fortunately there is no signs of active infection at this time. No fevers, chills, nausea, vomiting, or diarrhea. 02/10/2021 upon evaluation today patient appears to be doing well with regard to her wounds. With that being said there is some slight evidence of hypergranulation in regard to the right ankle in particular and a little bit in regard to the left heel. I think Hydrofera Blue might be a better option to go to at this point  based on what I am seeing especially since both of these wounds in particular seem to be a little bit stalled. I discussed that with the patient and her daughter today. With regard to the hip this is doing great with the collagen I think will get very close to complete closure I recommend we continue with the collagen. 02/17/2021 upon evaluation today patient appears to be doing excellent in regard to her heel ulcers. She has been tolerating the dressing changes without complication and overall I am extremely pleased with where things stand today. There does not appear to be any signs of active infection which is great news. Overall I may think that we are headed in the proper direction based on what I am seeing. 02/24/2021 upon evaluation today patient's wounds actually are showing signs of improvement in regard to her heels both are doing awesome. In [place], [place] ([id number]) regard to her hip location this is actually reopened after being closed last week and to be perfectly honest I am more concerned here about the fact that pressures causing this issue I do not think it has anything to do with her not having the collagen in place again it was completely healed last week there was no reason for the collagen to be there. 03/03/2021 upon evaluation today patient appears to be doing well with regard to her wounds. Everything is showing signs of improvement and doing some much better as far as the overall size of the wound. Fortunately I think we are headed in the right direction. 03/10/2021 upon evaluation today patient appears to be doing well with regard to her wound. She is tolerating the  dressing changes without complication. The left heel pretty much appears to be almost completely healed although I think it is probably can be 1 more week before I can call this 100% slough. The right ankle is significantly improved with that being said its not completely closed. With regard to the right hip this  is completely closed. Overall I am very pleased with where things stand I think were getting very close to complete resolution. I discussed all this with the patient and her daughter-in-law today. 03/17/2021 upon evaluation today patient appears to be doing well with regard to her wounds. Fortunately there does not appear to be any signs of active infection which is great news and overall very pleased with where the patient stands today. I am in general thankful that overall she is making great progress here. There is good to be a little bit of debridement here to clear away some of the necrotic debris today on both wound locations. 03/24/2021 upon evaluation today patient appears to be doing excellent in regard to her wound. She has been tolerating the dressing changes without complication. Fortunately there does not appear to be any evidence of infection the left foot is completely healed this is great news. On the right at the ankle region this is still open though I do not think the alginate did quite as well as I was hoping as far as getting this dried out. I think that we may just want to switch back to the [facility] which has done well up to this point. I was just hopeful this would wrap things up completely for [place]. 03/31/2021 upon evaluation today patient appears to be doing well with regard to her wound. This is on the right side which appears to be doing better than last week I think is definitely measuring smaller and this is great news. Overall I am very pleased with where we stand today. I do believe the [facility] is doing better for her. 04/14/2021 upon evaluation today patient appears to be doing well with regard to her wound. I am very pleased with the way it stands I think that she is headed in the right direction. Unfortunately she is having issues with the Tubigrip neither she nor her daughter-in-law who is the primary caregiver can get this on. Subsequently that means that  they have been having a very difficult time with complying with the necessary things for this left leg. With that being said I am really not sure what to do to help in that regard. We can always try a bigger that is larger size Tubigrip but at the same time that means that she may not get as good compression and it may not keep things under control the only way to know is to try. 04/21/2021 upon evaluation today patient appears to be doing well in regard to her wound. She has been tolerating the dressing changes without complication and in fact the right ankle ulcer is actually showing signs of excellent improvement I am very pleased with where things stand today. No fevers, chills, nausea, vomiting, or diarrhea. 04/28/2021 upon evaluation today patient appears to be doing well with regard to her ankle ulcer. This is actually showing signs of good improvement which is great news and overall very pleased with where we stand today. No fevers, chills, nausea, vomiting, or diarrhea. 05/05/2021 upon evaluation today patient's wound is actually showing signs of significant improvement and overall I am extremely pleased with where we stand today. There  does not appear to be any signs of active infection which is great news and overall I am extremely pleased with where we stand at this point. No fevers, chills, nausea, vomiting, or diarrhea. 05/12/2021 upon evaluation today patient appears to be doing okay in regard to her wound is measuring a little bit smaller today that is good news. With that being said she still continues to have significant issues here with an open wound on the lateral aspect of her ankle. Fortunately there does not appear to be any signs of active infection systemically which is great news. No fevers, chills, nausea, vomiting, or diarrhea. 06/13/2021 upon evaluation today patient unfortunately continues to have issues with her right leg. Specifically the ankle region where there is  an open wound. Previously the wound on the left leg had completely closed. Nonetheless this left heel has reopened as well. I am going to perform some debridement in regard to the heel ulcer appears to be a fluid-filled blister underneath this area. She has not been here for such a long time due to the fact that she was very sick and they were not able to bring her out. That is the reason we have not seen her in over the past month. 06/20/2021 upon evaluation patient's wounds currently are doing well on the left heel this appears to be healed. There does not appear to be any signs of drainage at this time which is great news. No fevers, chills, nausea, vomiting, or diarrhea. On the right heel there is still an open wound but I feel like we are showing some signs of improvement this is still good to take a bit of time. 06/27/2021 upon evaluation today patient appears to be doing better in regard to her wound. This is actually showing signs of good improvement which is great news. Fortunately I do not see any signs of infection currently which is great and overall we are finally seeing some actual decrease in the size I think that the compression wraps are making a big difference here. 12/27 some debris on the surface. No evidence of surrounding infection we have been using collagen 07/21/2021 upon evaluation today patient appears to be doing well with regard to her wound. She has been tolerating the dressing changes without complication. Fortunately I do not see any evidence of active infection locally nor systemically at this time which is great news and overall I think the wound making excellent progress here. 08/04/2021 upon evaluation today patient's wound is showing signs of being a little bit moist and really not significantly smaller. Fortunately I do not see any signs of active infection locally nor systemically at this time that is good news. Nonetheless I do feel like she is doing quite  well otherwise and her left leg is still doing awesome. 08/11/2021 upon evaluation today patient appears to be doing well with regard to her wound. She has been tolerating the dressing changes without complication. Fortunately there does not appear to be any signs of active infection at this time. The wound appears to be less macerated though I do believe she was moistening the alginate just a little bit I told her she does not need to do that when putting on a longer she voiced understanding and will not do so any longer. 08/18/2021 upon evaluation today patient's wound appears to be showing signs of being slightly larger compared to previous. We will talk in millimeters but nonetheless this is not headed in the right direction. Subsequently I  am not sure exactly what the best plan will be going forward. I think Sorbact could be a good option willing to give this a try over the next week and see how things go. Unfortunately she does have a new blister on the left heel which has arisen nothing is open and I am hoping it will reabsorb but again that may not be the case. We will have to keep [name], [place]. ([id number]) a close eye on this. 2/17; right lateral malleolus which has been a difficult wound seems to be doing well. Under illumination healthy tissue this is filling in nicely using Sorbact hydrogel and a border foam dressing. Apparently last week she developed a blister on her left medial calcaneus. Most of this is already epithelialized the use Sorbact on this as well. She is developing a threatened area on the medial aspect of her left first metatarsal head this is not open but looks like there is an irritation. They have ordered their adaptive foot wear which are arriving soon 09/01/2021 upon evaluation today patient appears to be doing very well in regard to her wounds in general. I do feel like that she is making excellent progress and overall I am extremely pleased with where we stand  today. Fortunately there does not appear to be any evidence of active infection locally nor systemically at this time. Fortunately I do not see any evidence of active infection which is also. Objective Constitutional Well-nourished and well-hydrated in no acute distress. Vitals Time Taken: 3:33 PM, Height: 66 in, Weight: 76 lbs, BMI: 12.3, Temperature: 98.2 F, Pulse: 68 bpm, Respiratory Rate: 16 breaths/min, Blood Pressure: 153/74 mmHg. Respiratory normal breathing without difficulty. Psychiatric this patient is able to make decisions and demonstrates good insight into disease process. Alert and Oriented x 3. pleasant and cooperative. General Notes: Upon inspection patient's wound bed actually showed signs of good granulation and epithelization at this point. Fortunately I do not see any evidence of active infection locally or systemically which is great news and overall I am extremely pleased with where we stand today. Integumentary (Hair, Skin) Wound #1 status is Open. Original cause of wound was Gradually Appeared. The date acquired was: 10/22/2014. The wound has been in treatment 45 weeks. The wound is located on the Right,Lateral Malleolus. The wound measures 0.7cm length x 0.8cm width x 0.2cm depth; 0.44cm^2 area and 0.088cm^3 volume. There is Fat Layer (Subcutaneous Tissue) exposed. There is no tunneling or undermining noted. There is a large amount of serosanguineous drainage noted. The wound margin is flat and intact. There is small (1-33%) pink granulation within the wound bed. There is a large (67-100%) amount of necrotic tissue within the wound bed including Adherent Slough. Wound #6 status is Open. Original cause of wound was Blister. The date acquired was: 08/18/2021. The wound has been in treatment 2 weeks. The wound is located on the Left,Medial Calcaneus. The wound measures 0.2cm length x 0.2cm width x 0.1cm depth; 0.031cm^2 area and 0.003cm^3 volume. The wound is limited to  skin breakdown. There is no tunneling or undermining noted. There is a medium amount of serous drainage noted. The wound margin is flat and intact. There is large (67-100%) red, pink granulation within the wound bed. There is no necrotic tissue within the wound bed. Assessment Active Problems ICD-10 Pressure ulcer of right ankle, stage 3 Other specified peripheral vascular diseases Pressure ulcer of left heel, stage 3 Essential (primary) hypertension Atherosclerotic heart disease of native coronary artery without angina pectoris  Procedures AVILA, ALBRITTON ([id number]) Wound #1 Pre-procedure diagnosis of Wound #1 is a Pressure Ulcer located on the Right,Lateral Malleolus . There was a Excisional Skin/Subcutaneous Tissue Debridement with a total area of 0.56 sq cm performed by Tommie Sams., PA-C. With the following instrument(s): Curette to remove Viable and Non-Viable tissue/material. Material removed includes Subcutaneous Tissue and Slough and after achieving pain control using Lidocaine. A time out was conducted at 15:44, prior to the start of the procedure. A Minimum amount of bleeding was controlled with Pressure. The procedure was tolerated well. Post Debridement Measurements: 0.7cm length x 0.8cm width x 0.2cm depth; 0.088cm^3 volume. Post debridement Stage noted as Category/Stage III. Character of Wound/Ulcer Post Debridement is improved. Post procedure Diagnosis Wound #1: Same as Pre-Procedure Plan Follow-up Appointments: Return Appointment in 1 week. Nurse Visit as needed Bathing/ Shower/ Hygiene: May shower with wound dressing protected with water repellent cover or cast protector. No tub bath. Anesthetic (Use 'Patient Medications' Section for Anesthetic Order Entry): Lidocaine applied to wound bed Edema Control - Lymphedema / Segmental Compressive Device / Other: Elevate legs to the level of the heart and pump ankles as often as possible Elevate leg(s) parallel to the  floor when sitting. DO YOUR BEST to sleep in the bed at night. DO NOT sleep in your recliner. Long hours of sitting in a recliner leads to swelling of the legs and/or potential wounds on your backside. Off-Loading: Turn and reposition every 2 hours Other: - float heel on pillow on bed or try BUNNY BOOT order on [place] to cover the pressure area on medial left foot with a padded dressing Additional Orders / Instructions: Follow Nutritious Diet and Increase Protein Intake WOUND #1: - Malleolus Wound Laterality: Right, Lateral Cleanser: Soap and Water 3 x Per Week/30 Days Discharge Instructions: Gently cleanse wound with antibacterial soap, rinse and pat dry prior to dressing wounds Primary Dressing: Cutimed Sorbact 1.5x 2.38 (in/in) 3 x Per Week/30 Days Discharge Instructions: A bacteria- and fungi binding wound dressing, suitable for cavities and fistulas. It is suitable as a wound filler and allows the passage of wound exudate into a secondary dressing. The dressing helps reducing odor and pain and can improve healing. Secondary Dressing: Gauze 3 x Per Week/30 Days Discharge Instructions: dry gauze under BF Secondary Dressing: Zetuvit Plus Silicone Border Dressing 4x4 (in/in) (Generic) 3 x Per Week/30 Days Secured With: Tubigrip Size C, 2.75x10 (in/yd) 3 x Per Week/30 Days Discharge Instructions: Apply 3 Tubigrip C 3-finger-widths below knee to base of toes to secure dressing and/or for swelling. WOUND #6: - Calcaneus Wound Laterality: Left, Medial Cleanser: Soap and Water 3 x Per Week/30 Days Discharge Instructions: Gently cleanse wound with antibacterial soap, rinse and pat dry prior to dressing wounds Primary Dressing: Cutimed Sorbact 1.5x 2.38 (in/in) 3 x Per Week/30 Days Discharge Instructions: A bacteria- and fungi binding wound dressing, suitable for cavities and fistulas. It is suitable as a wound filler and allows the passage of wound exudate into a secondary dressing.  The dressing helps reducing odor and pain and can improve healing. Secondary Dressing: Gauze 3 x Per Week/30 Days Discharge Instructions: dry gauze under BF Secondary Dressing: Zetuvit Plus Silicone Border Dressing 4x4 (in/in) (Generic) 3 x Per Week/30 Days Secured With: Tubigrip Size C, 2.75x10 (in/yd) 3 x Per Week/30 Days Discharge Instructions: Apply 3 Tubigrip C 3-finger-widths below knee to base of toes to secure dressing and/or for swelling. 1. Would recommend that we going continue with the wound care  measures as before and the patient is in agreement with plan. This includes the use of the Sorbact which I think is doing an awesome job. 2. We will continue with the Zetuvit to cover. 3. We will also continue with the Tubigrip size C. We will see patient back for reevaluation in 1 week here in the clinic. If anything worsens or changes patient will contact our office for additional recommendations. Electronic Signature(s) Signed: 09/01/2021 3:55:51 PM By: Worthy Keeler PA-C Entered By: Worthy Keeler on 09/01/2021 15:55:51 [place], [name] ([id number]) [name], [name] ([id number]) -------------------------------------------------------------------------------- SuperBill Details Patient Name: EMALEE, KNIES Date of Service: 09/01/2021 Medical Record Number: [id number] Patient Account Number: [id number] Date of Birth/Sex: Aug 11, 1927 (88 y.o. F) Treating RN: Donnamarie Poag Primary Care Provider: Viviana Simpler Other Clinician: Referring Provider: Viviana Simpler Treating Provider/Extender: Skipper Cliche in Treatment: 47 Diagnosis Coding ICD-10 Codes Code Description [phone] Pressure ulcer of right ankle, stage 3 I73.89 Other specified peripheral vascular diseases L89.623 Pressure ulcer of left heel, stage 3 I10 Essential (primary) hypertension I25.10 Atherosclerotic heart disease of native coronary artery without angina pectoris Facility Procedures CPT4 Code:  34196222 Description: 97989 - DEB SUBQ TISSUE 20 SQ CM/< Modifier: Quantity: 1 CPT4 Code: Description: ICD-10 Diagnosis Description L89.513 Pressure ulcer of right ankle, stage 3 Modifier: Quantity: Physician Procedures CPT4 Code: 2119417 Description: 11042 - WC PHYS SUBQ TISS 20 SQ CM Modifier: Quantity: 1 CPT4 Code: Description: ICD-10 Diagnosis Description L89.513 Pressure ulcer of right ankle, stage 3 Modifier: Quantity: Electronic Signature(s) Signed: 09/01/2021 3:57:44 PM By: Worthy Keeler PA-C Entered By: Worthy Keeler on 09/01/2021 15:57:43

## 2021-09-01 NOTE — Progress Notes (Addendum)
CHERIS, TWETEN ([id number]) Visit Report for 09/01/2021 Arrival Information Details Patient Name: [name], [name] Date of Service: 09/01/2021 3:15 PM Medical Record Number: [id number] Patient Account Number: [id number] Date of Birth/Sex: 13-Feb-1928 (88 y.o. F) Treating RN: Donnamarie Poag Primary Care Vertie Dibbern: Viviana Simpler Other Clinician: Referring Maille Halliwell: Viviana Simpler Treating Nelva Hauk/Extender: Skipper Cliche in Treatment: 35 Visit Information History Since Last Visit Added or deleted any medications: No Patient Arrived: [name] Had a fall or experienced change in No Arrival Time: 15:30 activities of daily living that may affect Accompanied By: daughter risk of falls: Transfer Assistance: None Hospitalized since last visit: No Patient Identification Verified: Yes Has Dressing in Place as Prescribed: Yes Secondary Verification Process Completed: Yes Pain Present Now: No Patient Requires Transmission-Based Precautions: No Patient Has Alerts: Yes Patient Alerts: NOT DIABETIC TBI left .77 TBI right .76 Electronic Signature(s) Signed: 09/01/2021 4:55:51 PM By: Donnamarie Poag Entered By: Donnamarie Poag on 09/01/2021 15:32:00 [name] ([id number]) -------------------------------------------------------------------------------- Encounter Discharge Information Details Patient Name: [name], [name] Date of Service: 09/01/2021 3:15 PM Medical Record Number: [id number] Patient Account Number: [id number] Date of Birth/Sex: 07-27-1927 (88 y.o. F) Treating RN: Donnamarie Poag Primary Care Pearse Shiffler: Viviana Simpler Other Clinician: Referring Chakira Jachim: Viviana Simpler Treating Calah Gershman/Extender: Skipper Cliche in Treatment: 44 Encounter Discharge Information Items Post Procedure Vitals Discharge Condition: Stable Temperature (F): 98.2 Ambulatory Status: Walker Pulse (bpm): 68 Discharge Destination: Home Respiratory Rate (breaths/min): 16 Transportation: Private Auto Blood  Pressure (mmHg): 153/74 Accompanied By: daughter Schedule Follow-up Appointment: Yes Clinical Summary of Care: Electronic Signature(s) Signed: 09/01/2021 4:55:51 PM By: Donnamarie Poag Entered By: Donnamarie Poag on 09/01/2021 15:57:05 Broadnax, [name] ([id number]) -------------------------------------------------------------------------------- Lower Extremity Assessment Details Patient Name: [name], [name] Date of Service: 09/01/2021 3:15 PM Medical Record Number: [id number] Patient Account Number: [id number] Date of Birth/Sex: October 09, 1927 (88 y.o. F) Treating RN: Donnamarie Poag Primary Care Rochele Lueck: Viviana Simpler Other Clinician: Referring Andy Allende: Viviana Simpler Treating Angeleigh Chiasson/Extender: Skipper Cliche in Treatment: 45 Edema Assessment Assessed: [Left: Yes] Patrice Paradise: Yes] Edema: [Left: No] [Right: No] Calf Left: Right: Point of Measurement: 33 cm From Medial Instep Ankle Left: Right: Point of Measurement: 9 cm From Medial Instep 19.5 cm 20.5 cm Vascular Assessment Pulses: Dorsalis Pedis Palpable: [Left:Yes] [Right:Yes] Electronic Signature(s) Signed: 09/01/2021 4:55:51 PM By: Donnamarie Poag Entered By: Donnamarie Poag on 09/01/2021 15:41:25 Ballinas, [name] ([id number]) -------------------------------------------------------------------------------- Multi Wound Chart Details Patient Name: [name] Date of Service: 09/01/2021 3:15 PM Medical Record Number: [id number] Patient Account Number: [id number] Date of Birth/Sex: [birth year] (88 y.o. F) Treating RN: Donnamarie Poag Primary Care Shera Laubach: Viviana Simpler Other Clinician: Referring Jenalee Trevizo: Viviana Simpler Treating Cordelia Bessinger/Extender: Skipper Cliche in Treatment: 45 Vital Signs Height(in): 66 Pulse(bpm): 79 Weight(lbs): 82 Blood Pressure(mmHg): 153/74 Body Mass Index(BMI): 12.3 Temperature(F): 98.2 Respiratory Rate(breaths/min): 16 Photos: [N/A:N/A] Wound Location: Right, Lateral Malleolus Left, Medial Calcaneus  N/A Wounding Event: Gradually Appeared Blister N/A Primary Etiology: Pressure Ulcer Abrasion N/A Comorbid History: Arrhythmia, Coronary Artery Arrhythmia, Coronary Artery N/A Disease, Hypertension, Peripheral Disease, Hypertension, Peripheral Arterial Disease, History of pressure Arterial Disease, History of pressure wounds, Osteoarthritis wounds, Osteoarthritis Date Acquired: 10/22/2014 08/18/2021 N/A Weeks of Treatment: 45 2 N/A Wound Status: Open Open N/A Wound Recurrence: No No N/A Measurements L x W x D (cm) 0.7x0.8x0.2 0.2x0.2x0.1 N/A Area (cm) : 0.44 0.031 N/A Volume (cm) : 0.088 0.003 N/A % Reduction in Area: 43.90% 97.30% N/A % Reduction in Volume: -11.40% 97.30% N/A Classification: Category/Stage III Partial Thickness N/A Exudate Amount: Large Medium N/A  Exudate Type: Serosanguineous Serous N/A Exudate Color: red, brown amber N/A Wound Margin: Flat and Intact Flat and Intact N/A Granulation Amount: Small (1-33%) Large (67-100%) N/A Granulation Quality: Pink Red, Pink N/A Necrotic Amount: Large (67-100%) None Present (0%) N/A Exposed Structures: Fat Layer (Subcutaneous Tissue): Fascia: No N/A Yes Fat Layer (Subcutaneous Tissue): Fascia: No No Tendon: No Tendon: No Muscle: No Muscle: No Joint: No Joint: No Bone: No Bone: No Limited to Skin Breakdown Epithelialization: None N/A N/A Treatment Notes Electronic Signature(s) Signed: 09/01/2021 4:55:51 PM By: Donnamarie Poag Entered By: Donnamarie Poag on 09/01/2021 15:41:43 Drab, [name] ([id number]) [name], [name] ([id number]) -------------------------------------------------------------------------------- [place] Details Patient Name: [name] Date of Service: 09/01/2021 3:15 PM Medical Record Number: [id number] Patient Account Number: [id number] Date of Birth/Sex: 1928/03/06 (88 y.o. F) Treating RN: Donnamarie Poag Primary Care Lateefah Mallery: Viviana Simpler Other Clinician: Referring Gethsemane Fischler:  Viviana Simpler Treating Aileen Amore/Extender: Skipper Cliche in Treatment: 45 Active Inactive Wound/Skin Impairment Nursing Diagnoses: Impaired tissue integrity Goals: Patient/caregiver will verbalize understanding of skin care regimen Date Initiated: 10/21/2020 Date Inactivated: 12/23/2020 Target Resolution Date: 12/21/2020 Goal Status: Met Ulcer/skin breakdown will have a volume reduction of 30% by week 4 Date Initiated: 10/21/2020 Date Inactivated: 11/25/2020 Target Resolution Date: 11/20/2020 Goal Status: Met Ulcer/skin breakdown will have a volume reduction of 50% by week 8 Date Initiated: 10/21/2020 Date Inactivated: 12/23/2020 Target Resolution Date: 12/21/2020 Goal Status: Met Ulcer/skin breakdown will have a volume reduction of 80% by week 12 Date Initiated: 10/21/2020 Date Inactivated: 02/17/2021 Target Resolution Date: 01/20/2021 Goal Status: Met Ulcer/skin breakdown will heal within 14 weeks Date Initiated: 10/21/2020 Target Resolution Date: 09/05/2021 Goal Status: Active Interventions: Assess patient/caregiver ability to obtain necessary supplies Assess patient/caregiver ability to perform ulcer/skin care regimen upon admission and as needed Assess ulceration(s) every visit Notes: Electronic Signature(s) Signed: 09/01/2021 4:55:51 PM By: Donnamarie Poag Entered By: Donnamarie Poag on 09/01/2021 15:41:34 Venhuizen, [name] ([id number]) -------------------------------------------------------------------------------- Pain Assessment Details Patient Name: [name] Date of Service: 09/01/2021 3:15 PM Medical Record Number: [id number] Patient Account Number: [id number] Date of Birth/Sex: 1928-04-20 (88 y.o. F) Treating RN: Donnamarie Poag Primary Care Mairen Wallenstein: Viviana Simpler Other Clinician: Referring Kenneth Lax: Viviana Simpler Treating Ashland Wiseman/Extender: Skipper Cliche in Treatment: 56 Active Problems Location of Pain Severity and Description of Pain Patient Has Paino  No Site Locations Rate the pain. Current Pain Level: 0 Pain Management and Medication Current Pain Management: Electronic Signature(s) Signed: 09/01/2021 4:55:51 PM By: Donnamarie Poag Entered By: Donnamarie Poag on 09/01/2021 15:35:12 Kehl, [name] ([id number]) -------------------------------------------------------------------------------- Patient/Caregiver Education Details Patient Name: [name] Date of Service: 09/01/2021 3:15 PM Medical Record Number: [id number] Patient Account Number: [id number] Date of Birth/Gender: [birth year] (88 y.o. F) Treating RN: Donnamarie Poag Primary Care Physician: Viviana Simpler Other Clinician: Referring Physician: Viviana Simpler Treating Physician/Extender: Skipper Cliche in Treatment: 39 Education Assessment Education Provided To: Patient and Caregiver Education Topics Provided Basic Hygiene: Wound/Skin Impairment: Engineer, maintenance) Signed: 09/01/2021 4:55:51 PM By: Donnamarie Poag Entered By: Donnamarie Poag on 09/01/2021 15:49:59 Woehl, [name] ([id number]) -------------------------------------------------------------------------------- Wound Assessment Details Patient Name: [name], [name] Date of Service: 09/01/2021 3:15 PM Medical Record Number: [id number] Patient Account Number: [id number] Date of Birth/Sex: 1928/04/12 (88 y.o. F) Treating RN: Donnamarie Poag Primary Care Eliakim Tendler: Viviana Simpler Other Clinician: Referring Marg Macmaster: Viviana Simpler Treating Shamon Cothran/Extender: Skipper Cliche in Treatment: 45 Wound Status Wound Number: 1 Primary Pressure Ulcer Etiology: Wound Location: Right, Lateral Malleolus Wound Open Wounding Event: Gradually Appeared Status: Date Acquired:  10/22/2014 Comorbid Arrhythmia, Coronary Artery Disease, Hypertension, Weeks Of Treatment: 45 History: Peripheral Arterial Disease, History of pressure wounds, Clustered Wound: No Osteoarthritis Photos Wound Measurements Length: (cm) 0.7 Width: (cm)  0.8 Depth: (cm) 0.2 Area: (cm) 0.44 Volume: (cm) 0.088 % Reduction in Area: 43.9% % Reduction in Volume: -11.4% Epithelialization: None Tunneling: No Undermining: No Wound Description Classification: Category/Stage III Wound Margin: Flat and Intact Exudate Amount: Large Exudate Type: Serosanguineous Exudate Color: red, brown Foul Odor After Cleansing: No Slough/Fibrino Yes Wound Bed Granulation Amount: Small (1-33%) Exposed Structure Granulation Quality: Pink Fascia Exposed: No Necrotic Amount: Large (67-100%) Fat Layer (Subcutaneous Tissue) Exposed: Yes Necrotic Quality: Adherent Slough Tendon Exposed: No Muscle Exposed: No Joint Exposed: No Bone Exposed: No Treatment Notes Wound #1 (Malleolus) Wound Laterality: Right, Lateral Cleanser Soap and Water Discharge Instruction: Gently cleanse wound with antibacterial soap, rinse and pat dry prior to dressing wounds TIEASHA, LARSEN ([id number]) Peri-Wound Care Topical Primary Dressing Cutimed Sorbact 1.5x 2.38 (in/in) Discharge Instruction: A bacteria- and fungi binding wound dressing, suitable for cavities and fistulas. It is suitable as a wound filler and allows the passage of wound exudate into a secondary dressing. The dressing helps reducing odor and pain and can improve healing. Secondary Dressing Gauze Discharge Instruction: dry gauze under BF Zetuvit Plus Silicone Border Dressing 4x4 (in/in) Secured With Tubigrip Size C, 2.75x10 (in/yd) Discharge Instruction: Apply 3 Tubigrip C 3-finger-widths below knee to base of toes to secure dressing and/or for swelling. Compression Wrap Compression Stockings Add-Ons Electronic Signature(s) Signed: 09/01/2021 4:55:51 PM By: Donnamarie Poag Entered By: Donnamarie Poag on 09/01/2021 15:39:35 Auguste, [name] ([id number]) -------------------------------------------------------------------------------- Wound Assessment Details Patient Name: [name], [name] Date of Service:  09/01/2021 3:15 PM Medical Record Number: [id number] Patient Account Number: [id number] Date of Birth/Sex: 1928-05-17 (88 y.o. F) Treating RN: Donnamarie Poag Primary Care Josclyn Rosales: Viviana Simpler Other Clinician: Referring Tilda Samudio: Viviana Simpler Treating Jaquesha Boroff/Extender: Skipper Cliche in Treatment: 36 Wound Status Wound Number: 6 Primary Abrasion Etiology: Wound Location: Left, Medial Calcaneus Wound Open Wounding Event: Blister Status: Date Acquired: 08/18/2021 Comorbid Arrhythmia, Coronary Artery Disease, Hypertension, Weeks Of Treatment: 2 History: Peripheral Arterial Disease, History of pressure wounds, Clustered Wound: No Osteoarthritis Photos Wound Measurements Length: (cm) 0.2 Width: (cm) 0.2 Depth: (cm) 0.1 Area: (cm) 0.031 Volume: (cm) 0.003 % Reduction in Area: 97.3% % Reduction in Volume: 97.3% Tunneling: No Undermining: No Wound Description Classification: Partial Thickness Wound Margin: Flat and Intact Exudate Amount: Medium Exudate Type: Serous Exudate Color: amber Foul Odor After Cleansing: No Slough/Fibrino No Wound Bed Granulation Amount: Large (67-100%) Exposed Structure Granulation Quality: Red, Pink Fascia Exposed: No Necrotic Amount: None Present (0%) Fat Layer (Subcutaneous Tissue) Exposed: No Tendon Exposed: No Muscle Exposed: No Joint Exposed: No Bone Exposed: No Limited to Skin Breakdown Treatment Notes Wound #6 (Calcaneus) Wound Laterality: Left, Medial Cleanser Soap and Water Discharge Instruction: Gently cleanse wound with antibacterial soap, rinse and pat dry prior to dressing wounds [name], [name] ([id number]) Peri-Wound Care Topical Primary Dressing Cutimed Sorbact 1.5x 2.38 (in/in) Discharge Instruction: A bacteria- and fungi binding wound dressing, suitable for cavities and fistulas. It is suitable as a wound filler and allows the passage of wound exudate into a secondary dressing. The dressing helps reducing odor  and pain and can improve healing. Secondary Dressing Gauze Discharge Instruction: dry gauze under BF Zetuvit Plus Silicone Border Dressing 4x4 (in/in) Secured With Tubigrip Size C, 2.75x10 (in/yd) Discharge Instruction: Apply 3 Tubigrip C 3-finger-widths below knee to base of toes to  secure dressing and/or for swelling. Compression Wrap Compression Stockings Add-Ons Electronic Signature(s) Signed: 09/01/2021 4:55:51 PM By: Donnamarie Poag Entered By: Donnamarie Poag on 09/01/2021 15:40:26 Karn, [name] ([id number]) -------------------------------------------------------------------------------- [name] Details Patient Name: [name] Date of Service: 09/01/2021 3:15 PM Medical Record Number: [id number] Patient Account Number: [id number] Date of Birth/Sex: January 18, 1928 (88 y.o. F) Treating RN: Donnamarie Poag Primary Care Jersie Beel: Viviana Simpler Other Clinician: Referring Sakai Heinle: Viviana Simpler Treating Enis Riecke/Extender: Skipper Cliche in Treatment: 45 Vital Signs Time Taken: 15:33 Temperature (F): 98.2 Height (in): 66 Pulse (bpm): 68 Weight (lbs): 76 Respiratory Rate (breaths/min): 16 Body Mass Index (BMI): 12.3 Blood Pressure (mmHg): 153/74 Reference Range: 80 - 120 mg / dl Electronic Signature(s) Signed: 09/01/2021 4:55:51 PM By: Donnamarie Poag Entered ByDonnamarie Poag on 09/01/2021 15:34:35

## 2021-09-05 DIAGNOSIS — Z961 Presence of intraocular lens: Secondary | ICD-10-CM | POA: Diagnosis not present

## 2021-09-08 ENCOUNTER — Encounter: Payer: PPO | Attending: Physician Assistant | Admitting: Physician Assistant

## 2021-09-08 ENCOUNTER — Other Ambulatory Visit: Payer: Self-pay

## 2021-09-08 DIAGNOSIS — I70233 Atherosclerosis of native arteries of right leg with ulceration of ankle: Secondary | ICD-10-CM | POA: Insufficient documentation

## 2021-09-08 DIAGNOSIS — I2510 Atherosclerotic heart disease of native coronary artery without angina pectoris: Secondary | ICD-10-CM | POA: Diagnosis not present

## 2021-09-08 DIAGNOSIS — L97312 Non-pressure chronic ulcer of right ankle with fat layer exposed: Secondary | ICD-10-CM | POA: Diagnosis not present

## 2021-09-08 DIAGNOSIS — L89623 Pressure ulcer of left heel, stage 3: Secondary | ICD-10-CM | POA: Diagnosis not present

## 2021-09-08 DIAGNOSIS — I10 Essential (primary) hypertension: Secondary | ICD-10-CM | POA: Diagnosis not present

## 2021-09-08 DIAGNOSIS — L97428 Non-pressure chronic ulcer of left heel and midfoot with other specified severity: Secondary | ICD-10-CM | POA: Diagnosis not present

## 2021-09-08 DIAGNOSIS — I70244 Atherosclerosis of native arteries of left leg with ulceration of heel and midfoot: Secondary | ICD-10-CM | POA: Diagnosis not present

## 2021-09-08 DIAGNOSIS — L89513 Pressure ulcer of right ankle, stage 3: Secondary | ICD-10-CM | POA: Diagnosis not present

## 2021-09-08 NOTE — Progress Notes (Addendum)
Elizabeth, Downs ([id number]) Visit Report for 09/08/2021 Arrival Information Details Patient Name: [name], [name] Date of Service: 09/08/2021 3:15 PM Medical Record Number: [id number] Patient Account Number: [id number] Date of Birth/Sex: 19-Dec-1927 (88 y.o. F) Treating RN: Donnamarie Poag Primary Care Victor Langenbach: Viviana Simpler Other Clinician: Referring Gerrica Cygan: Viviana Simpler Treating Lelynd Poer/Extender: Skipper Cliche in Treatment: 23 Visit Information History Since Last Visit Added or deleted any medications: No Patient Arrived: [name] Had a fall or experienced change in No Arrival Time: 15:32 activities of daily living that may affect Accompanied By: daughter risk of falls: Transfer Assistance: None Hospitalized since last visit: No Patient Identification Verified: Yes Has Dressing in Place as Prescribed: Yes Secondary Verification Process Completed: Yes Pain Present Now: No Patient Requires Transmission-Based Precautions: No Patient Has Alerts: Yes Patient Alerts: NOT DIABETIC TBI left .77 TBI right .76 Electronic Signature(s) Signed: 09/08/2021 3:48:34 PM By: Donnamarie Poag Entered By: Donnamarie Poag on 09/08/2021 15:38:05 [name] ([id number]) -------------------------------------------------------------------------------- Clinic Level of Care Assessment Details Patient Name: Elizabeth, Downs Date of Service: 09/08/2021 3:15 PM Medical Record Number: [id number] Patient Account Number: [id number] Date of Birth/Sex: 11/22/1927 (88 y.o. F) Treating RN: Donnamarie Poag Primary Care Copelyn Widmer: Viviana Simpler Other Clinician: Referring Sheranda Seabrooks: Viviana Simpler Treating Naiyana Barbian/Extender: Skipper Cliche in Treatment: 56 Clinic Level of Care Assessment Items TOOL 4 Quantity Score [] - Use when only an EandM is performed on FOLLOW-UP visit 0 ASSESSMENTS - Nursing Assessment / Reassessment [] - Reassessment of Co-morbidities (includes updates in patient status) 0 [] -  0 Reassessment of Adherence to Treatment Plan ASSESSMENTS - Wound and Skin Assessment / Reassessment [] - Simple Wound Assessment / Reassessment - one wound 0 X- 2 5 Complex Wound Assessment / Reassessment - multiple wounds [] - 0 Dermatologic / Skin Assessment (not related to wound area) ASSESSMENTS - Focused Assessment [] - Circumferential Edema Measurements - multi extremities 0 [] - 0 Nutritional Assessment / Counseling / Intervention [] - 0 Lower Extremity Assessment (monofilament, tuning fork, pulses) [] - 0 Peripheral Arterial Disease Assessment (using hand held doppler) ASSESSMENTS - Ostomy and/or Continence Assessment and Care [] - Incontinence Assessment and Management 0 [] - 0 Ostomy Care Assessment and Management (repouching, etc.) PROCESS - Coordination of Care X - Simple Patient / Family Education for ongoing care 1 15 [] - 0 Complex (extensive) Patient / Family Education for ongoing care [] - 0 Staff obtains Programmer, systems, Records, Test Results / Process Orders [] - 0 Staff telephones HHA, Nursing Homes / Clarify orders / etc [] - 0 Routine Transfer to another Facility (non-emergent condition) [] - 0 Routine Hospital Admission (non-emergent condition) [] - 0 New Admissions / Biomedical engineer / Ordering NPWT, Apligraf, etc. [] - 0 Emergency Hospital Admission (emergent condition) X- 1 10 Simple Discharge Coordination [] - 0 Complex (extensive) Discharge Coordination PROCESS - Special Needs [] - Pediatric / Minor Patient Management 0 [] - 0 Isolation Patient Management [] - 0 Hearing / Language / Visual special needs [] - 0 Assessment of Community assistance (transportation, D/C planning, etc.) [] - 0 Additional assistance / Altered mentation [] - 0 Support Surface(s) Assessment (bed, cushion, seat, etc.) INTERVENTIONS - Wound Cleansing / Measurement TRISTON, LISANTI ([id number]) [] - 0 Simple Wound Cleansing - one wound X- 2 5 Complex Wound  Cleansing - multiple wounds X- 1 5 Wound Imaging (photographs - any number of wounds) [] - 0 Wound Tracing (instead of photographs) [] - 0 Simple Wound Measurement -  one wound °X- 2 5 °Complex Wound Measurement - multiple wounds °INTERVENTIONS - Wound Dressings °X - Small Wound Dressing one or multiple wounds 2 10 °[] - 0 °Medium Wound Dressing one or multiple wounds °[] - 0 °Large Wound Dressing one or multiple wounds °X- 1 5 °Application of Medications - topical °[] - 0 °Application of Medications - injection °INTERVENTIONS - Miscellaneous °[] - External ear exam 0 °[] - 0 °Specimen Collection (cultures, biopsies, blood, body fluids, etc.) °[] - 0 °Specimen(s) / Culture(s) sent or taken to Lab for analysis °[] - 0 °Patient Transfer (multiple staff / Hoyer Lift / Similar devices) °[] - 0 °Simple Staple / Suture removal (25 or less) °[] - 0 °Complex Staple / Suture removal (26 or more) °[] - 0 °Hypo / Hyperglycemic Management (close monitor of Blood Glucose) °[] - 0 °Ankle / Brachial Index (ABI) - do not check if billed separately °X- 1 5 °Vital Signs °Has the patient been seen at the hospital within the last three years: Yes °Total Score: 90 °Level Of Care: New/Established - Level °3 °Electronic Signature(s) °Signed: 09/08/2021 4:50:54 PM By: Bishop, Joy °Entered By: Bishop, Joy on 09/08/2021 15:55:33 °Wilbert, Dorisann G. ([id number]) °-------------------------------------------------------------------------------- °Encounter Discharge Information Details °Patient Name: Downs, Elizabeth G. °Date of Service: 09/08/2021 3:15 PM °Medical Record Number: [id number] °Patient Account Number: [id number] °Date of Birth/Sex: 01/26/1928 (88 y.o. F) °Treating RN: Bishop, Joy °Primary Care Provider: Letvak, Richard Other Clinician: °Referring Provider: Letvak, Richard °Treating Provider/Extender: Stone, Hoyt °Weeks in Treatment: 46 °Encounter Discharge Information Items °Discharge Condition: Stable °Ambulatory Status: Walker °Discharge  Destination: Home °Transportation: Private Auto °Accompanied By: daughter °Schedule Follow-up Appointment: Yes °Clinical Summary of Care: °Electronic Signature(s) °Signed: 09/08/2021 4:50:54 PM By: Bishop, Joy °Entered By: Bishop, Joy on 09/08/2021 16:06:33 °Wedig, Money G. ([id number]) °-------------------------------------------------------------------------------- °Lower Extremity Assessment Details °Patient Name: Mehra, Rayhana G. °Date of Service: 09/08/2021 3:15 PM °Medical Record Number: [id number] °Patient Account Number: [id number] °Date of Birth/Sex: 08/07/1927 (88 y.o. F) °Treating RN: Bishop, Joy °Primary Care Provider: Letvak, Richard Other Clinician: °Referring Provider: Letvak, Richard °Treating Provider/Extender: Stone, Hoyt °Weeks in Treatment: 46 °Edema Assessment °Assessed: [Left: Yes] [Right: Yes] °[Left: Edema] [Right: :] °Calf °Left: Right: °Point of Measurement: 33 cm From Medial Instep °Ankle °Left: Right: °Point of Measurement: 9 cm From Medial Instep 20 cm 21 cm °Vascular Assessment °Pulses: °Dorsalis Pedis °Palpable: [Left:Yes] [Right:Yes] °Electronic Signature(s) °Signed: 09/08/2021 3:48:34 PM By: Bishop, Joy °Entered By: Bishop, Joy on 09/08/2021 15:46:11 °Rimel, Jevon G. ([id number]) °-------------------------------------------------------------------------------- °Multi Wound Chart Details °Patient Name: Magda, Macala G. °Date of Service: 09/08/2021 3:15 PM °Medical Record Number: [id number] °Patient Account Number: [id number] °Date of Birth/Sex: 09/05/1927 (88 y.o. F) °Treating RN: Bishop, Joy °Primary Care Provider: Letvak, Richard Other Clinician: °Referring Provider: Letvak, Richard °Treating Provider/Extender: Stone, Hoyt °Weeks in Treatment: 46 °Vital Signs °Height(in): 66 °Pulse(bpm): 76 °Weight(lbs): 76 °Blood Pressure(mmHg): 173/76 °Body Mass Index(BMI): 12.3 °Temperature(Â°F): 98.2 °Respiratory Rate(breaths/min): 16 °Photos: [N/A:N/A] °Wound Location: Right, Lateral Malleolus Left, Medial  Calcaneus N/A °Wounding Event: Gradually Appeared Blister N/A °Primary Etiology: Pressure Ulcer Abrasion N/A °Comorbid History: Arrhythmia, Coronary Artery Arrhythmia, Coronary Artery N/A °Disease, Hypertension, Peripheral Disease, Hypertension, Peripheral °Arterial Disease, History of pressure Arterial Disease, History of pressure °wounds, Osteoarthritis wounds, Osteoarthritis °Date Acquired: 10/22/2014 08/18/2021 N/A °Weeks of Treatment: 46 3 N/A °Wound Status: Open Open N/A °Wound Recurrence: No No N/A °Measurements L x W x D (cm) 0.6x0.6x0.1 0.1x0.1x0.1 N/A °Area (cm²) : 0.283 0.008 N/A °Volume (cm³) : 0.028 0.001 N/A °% Reduction in   Area: 63.90% 99.30% N/A °% Reduction in Volume: 64.60% 99.10% N/A °Classification: Category/Stage III Partial Thickness N/A °Exudate Amount: Large Medium N/A °Exudate Type: Serosanguineous Serous N/A °Exudate Color: red, brown amber N/A °Wound Margin: Flat and Intact Flat and Intact N/A °Granulation Amount: Large (67-100%) Large (67-100%) N/A °Granulation Quality: Pink Red, Pink N/A °Necrotic Amount: Small (1-33%) None Present (0%) N/A °Exposed Structures: °Fat Layer (Subcutaneous Tissue): Fascia: No N/A °Yes Fat Layer (Subcutaneous Tissue): °Fascia: No No °Tendon: No °Tendon: No °Muscle: No °Muscle: No °Joint: No °Joint: No °Bone: No °Bone: No °Limited to Skin Breakdown °Epithelialization: None N/A N/A °Treatment Notes °Electronic Signature(s) °Signed: 09/08/2021 3:48:34 PM By: Bishop, Joy °Entered By: Bishop, Joy on 09/08/2021 15:46:39 °Leino, Maika G. ([id number]) °Cales, Akya G. ([id number]) °-------------------------------------------------------------------------------- °Multi-Disciplinary Care Plan Details °Patient Name: Bagent, Mishell G. °Date of Service: 09/08/2021 3:15 PM °Medical Record Number: [id number] °Patient Account Number: [id number] °Date of Birth/Sex: 08/11/1927 (88 y.o. F) °Treating RN: Bishop, Joy °Primary Care Provider: Letvak, Richard Other Clinician: °Referring  Provider: Letvak, Richard °Treating Provider/Extender: Stone, Hoyt °Weeks in Treatment: 46 °Active Inactive °Wound/Skin Impairment °Nursing Diagnoses: °Impaired tissue integrity °Goals: °Patient/caregiver will verbalize understanding of skin care regimen °Date Initiated: 10/21/2020 °Date Inactivated: 12/23/2020 °Target Resolution Date: 12/21/2020 °Goal Status: Met °Ulcer/skin breakdown will have a volume reduction of 30% by week 4 °Date Initiated: 10/21/2020 °Date Inactivated: 11/25/2020 °Target Resolution Date: 11/20/2020 °Goal Status: Met °Ulcer/skin breakdown will have a volume reduction of 50% by week 8 °Date Initiated: 10/21/2020 °Date Inactivated: 12/23/2020 °Target Resolution Date: 12/21/2020 °Goal Status: Met °Ulcer/skin breakdown will have a volume reduction of 80% by week 12 °Date Initiated: 10/21/2020 °Date Inactivated: 02/17/2021 °Target Resolution Date: 01/20/2021 °Goal Status: Met °Ulcer/skin breakdown will heal within 14 weeks °Date Initiated: 10/21/2020 °Target Resolution Date: 09/05/2021 °Goal Status: Active °Interventions: °Assess patient/caregiver ability to obtain necessary supplies °Assess patient/caregiver ability to perform ulcer/skin care regimen upon admission and as needed °Assess ulceration(s) every visit °Notes: °Electronic Signature(s) °Signed: 09/08/2021 3:48:34 PM By: Bishop, Joy °Entered By: Bishop, Joy on 09/08/2021 15:46:23 °Common, Cornella G. ([id number]) °-------------------------------------------------------------------------------- °Pain Assessment Details °Patient Name: Chenard, Miosotis G. °Date of Service: 09/08/2021 3:15 PM °Medical Record Number: [id number] °Patient Account Number: [id number] °Date of Birth/Sex: 10/21/1927 (88 y.o. F) °Treating RN: Bishop, Joy °Primary Care Provider: Letvak, Richard Other Clinician: °Referring Provider: Letvak, Richard °Treating Provider/Extender: Stone, Hoyt °Weeks in Treatment: 46 °Active Problems °Location of Pain Severity and Description of Pain °Patient Has  Paino No °Site Locations °Rate the pain. °Current Pain Level: 2 °Pain Management and Medication °Current Pain Management: °Electronic Signature(s) °Signed: 09/08/2021 3:48:34 PM By: Bishop, Joy °Entered By: Bishop, Joy on 09/08/2021 15:38:44 °Dungee, Riona G. ([id number]) °-------------------------------------------------------------------------------- °Patient/Caregiver Education Details °Patient Name: Bovard, Laruth G. °Date of Service: 09/08/2021 3:15 PM °Medical Record Number: [id number] °Patient Account Number: [id number] °Date of Birth/Gender: 02/25/1928 (88 y.o. F) °Treating RN: Bishop, Joy °Primary Care Physician: Letvak, Richard Other Clinician: °Referring Physician: Letvak, Richard °Treating Physician/Extender: Stone, Hoyt °Weeks in Treatment: 46 °Education Assessment °Education Provided To: °Patient and Caregiver °Education Topics Provided °Basic Hygiene: °Offloading: °Wound/Skin Impairment: °Electronic Signature(s) °Signed: 09/08/2021 4:50:54 PM By: Bishop, Joy °Entered By: Bishop, Joy on 09/08/2021 15:55:52 °Perrow, Fia G. ([id number]) °-------------------------------------------------------------------------------- °Wound Assessment Details °Patient Name: Mijangos, Kimla G. °Date of Service: 09/08/2021 3:15 PM °Medical Record Number: [id number] °Patient Account Number: [id number] °Date of Birth/Sex: 08/14/1927 (88 y.o. F) °Treating RN: Bishop, Joy °Primary Care Provider: Letvak, Richard Other Clinician: °Referring Provider: Letvak, Richard °Treating Provider/Extender: Stone, Hoyt °Weeks in Treatment: 46 °  Wound Status °Wound Number: 1 Primary Pressure Ulcer °Etiology: °Wound Location: Right, Lateral Malleolus °Wound Open °Wounding Event: Gradually Appeared °Status: °Date Acquired: 10/22/2014 °Comorbid Arrhythmia, Coronary Artery Disease, Hypertension, °Weeks Of Treatment: 46 °History: Peripheral Arterial Disease, History of pressure wounds, °Clustered Wound: No °Osteoarthritis °Photos °Wound Measurements °Length: (cm)  0.6 °Width: (cm) 0.6 °Depth: (cm) 0.1 °Area: (cm²) 0.283 °Volume: (cm³) 0.028 °% Reduction in Area: 63.9% °% Reduction in Volume: 64.6% °Epithelialization: None °Tunneling: No °Undermining: No °Wound Description °Classification: Category/Stage III °Wound Margin: Flat and Intact °Exudate Amount: Large °Exudate Type: Serosanguineous °Exudate Color: red, brown °Foul Odor After Cleansing: No °Slough/Fibrino Yes °Wound Bed °Granulation Amount: Large (67-100%) Exposed Structure °Granulation Quality: Pink °Fascia Exposed: No °Necrotic Amount: Small (1-33%) °Fat Layer (Subcutaneous Tissue) Exposed: Yes °Necrotic Quality: Adherent Slough °Tendon Exposed: No °Muscle Exposed: No °Joint Exposed: No °Bone Exposed: No °Treatment Notes °Wound #1 (Malleolus) Wound Laterality: Right, Lateral °Cleanser °Soap and Water °Discharge Instruction: Gently cleanse wound with antibacterial soap, rinse and pat dry prior to dressing wounds °Shimko, Baily G. ([id number]) °Peri-Wound Care °Topical °Primary Dressing °Cutimed Sorbact 1.5x 2.38 (in/in) °Discharge Instruction: A bacteria- and fungi binding wound dressing, suitable for cavities and fistulas. It is suitable as a wound filler °and allows the passage of wound exudate into a secondary dressing. The dressing helps reducing odor and pain and can improve °healing. °Secondary Dressing °Gauze °Discharge Instruction: dry gauze under BF °(SILICONE BORDER) Zetuvit Plus SILICONE BORDER Dressing 4x4 (in/in) °Secured With °Tubigrip Size C, 2.75x10 (in/yd) °Discharge Instruction: Apply 3 Tubigrip C 3-finger-widths below knee to base of toes to secure dressing and/or for swelling. °Compression Wrap °Compression Stockings °Add-Ons °Electronic Signature(s) °Signed: 09/08/2021 3:48:34 PM By: Bishop, Joy °Entered By: Bishop, Joy on 09/08/2021 15:43:02 °Nowak, Carroll G. ([id number]) °-------------------------------------------------------------------------------- °Wound Assessment Details °Patient Name:  Dome, Marytza G. °Date of Service: 09/08/2021 3:15 PM °Medical Record Number: [id number] °Patient Account Number: [id number] °Date of Birth/Sex: 11/13/1927 (88 y.o. F) °Treating RN: Bishop, Joy °Primary Care Roel Douthat: Letvak, Richard Other Clinician: °Referring Trixie Maclaren: Letvak, Richard °Treating Estephani Popper/Extender: Stone, Hoyt °Weeks in Treatment: 46 °Wound Status °Wound Number: 6 Primary Abrasion °Etiology: °Wound Location: Left, Medial Calcaneus °Wound Open °Wounding Event: Blister °Status: °Date Acquired: 08/18/2021 °Comorbid Arrhythmia, Coronary Artery Disease, Hypertension, °Weeks Of Treatment: 3 °History: Peripheral Arterial Disease, History of pressure wounds, °Clustered Wound: No °Osteoarthritis °Photos °Wound Measurements °Length: (cm) 0.1 °Width: (cm) 0.1 °Depth: (cm) 0.1 °Area: (cm²) 0.008 °Volume: (cm³) 0.001 °% Reduction in Area: 99.3% °% Reduction in Volume: 99.1% °Tunneling: No °Undermining: No °Wound Description °Classification: Partial Thickness °Wound Margin: Flat and Intact °Exudate Amount: Medium °Exudate Type: Serous °Exudate Color: amber °Foul Odor After Cleansing: No °Slough/Fibrino No °Wound Bed °Granulation Amount: Large (67-100%) Exposed Structure °Granulation Quality: Red, Pink °Fascia Exposed: No °Necrotic Amount: None Present (0%) °Fat Layer (Subcutaneous Tissue) Exposed: No °Tendon Exposed: No °Muscle Exposed: No °Joint Exposed: No °Bone Exposed: No °Limited to Skin Breakdown °Treatment Notes °Wound #6 (Calcaneus) Wound Laterality: Left, Medial °Cleanser °Soap and Water °Discharge Instruction: Gently cleanse wound with antibacterial soap, rinse and pat dry prior to dressing wounds °Carbary, Malashia G. ([id number]) °Peri-Wound Care °Topical °Primary Dressing °Cutimed Sorbact 1.5x 2.38 (in/in) °Discharge Instruction: A bacteria- and fungi binding wound dressing, suitable for cavities and fistulas. It is suitable as a wound filler °and allows the passage of wound exudate into a secondary dressing.  The dressing helps reducing odor and pain and can improve °healing. °Secondary Dressing °Gauze °Discharge Instruction: dry gauze under BF °(SILICONE BORDER) Zetuvit   Plus SILICONE BORDER Dressing 4x4 (in/in) Secured With Tubigrip Size C, 2.75x10 (in/yd) Discharge Instruction: Apply 3 Tubigrip C 3-finger-widths below knee to base of toes to secure dressing and/or for swelling. Compression Wrap Compression Stockings Add-Ons Electronic Signature(s) Signed: 09/08/2021 3:48:34 PM By: Donnamarie Poag Entered By: Donnamarie Poag on 09/08/2021 15:43:36 Shepler, [name] ([id number]) -------------------------------------------------------------------------------- Vitals Details Patient Name: [name] Date of Service: 09/08/2021 3:15 PM Medical Record Number: [id number] Patient Account Number: [id number] Date of Birth/Sex: 07-28-1927 (88 y.o. F) Treating RN: Donnamarie Poag Primary Care Azam Gervasi: Viviana Simpler Other Clinician: Referring Tanuj Mullens: Viviana Simpler Treating Todrick Siedschlag/Extender: Skipper Cliche in Treatment: 57 Vital Signs Time Taken: 15:38 Temperature (F): 98.2 Height (in): 66 Pulse (bpm): 76 Weight (lbs): 76 Respiratory Rate (breaths/min): 16 Body Mass Index (BMI): 12.3 Blood Pressure (mmHg): 173/76 Reference Range: 80 - 120 mg / dl Electronic Signature(s) Signed: 09/08/2021 3:48:34 PM By: Donnamarie Poag Entered ByDonnamarie Poag on 09/08/2021 15:38:29

## 2021-09-08 NOTE — Progress Notes (Addendum)
LAI, HENDRIKS ([id number]) Visit Report for 09/08/2021 Chief Complaint Document Details Patient Name: [name], [name]. Date of Service: 09/08/2021 3:15 PM Medical Record Number: [id number] Patient Account Number: [id number] Date of Birth/Sex: 11/23/1927 (88 y.o. F) Treating RN: Donnamarie Poag Primary Care Provider: Viviana Simpler Other Clinician: Referring Provider: Viviana Simpler Treating Provider/Extender: Skipper Cliche in Treatment: 48 Information Obtained from: Patient Chief Complaint Pressure ulcer right ankle and left heel Electronic Signature(s) Signed: 09/08/2021 3:18:13 PM By: Worthy Keeler PA-C Entered By: Worthy Keeler on 09/08/2021 15:18:13 [name] ([id number]) -------------------------------------------------------------------------------- HPI Details Patient Name: [name], [name] Date of Service: 09/08/2021 3:15 PM Medical Record Number: [id number] Patient Account Number: [id number] Date of Birth/Sex: 1927/09/23 (88 y.o. F) Treating RN: Donnamarie Poag Primary Care Provider: Viviana Simpler Other Clinician: Referring Provider: Viviana Simpler Treating Provider/Extender: Skipper Cliche in Treatment: 13 History of Present Illness HPI Description: 10/21/2020 upon evaluation today patient presents for initial inspection here in our clinic concerning issues that she has been having quite some time in regard to her ankle and since she has been in the hospital with regard to the heel. The ankle in fact has been 5-6 years at least I am told. With that being said the heel ulcer occurred when she was in the hospital in December for hip surgery when she fractured her hip. She also was in the hospital Tuesday for altered mental status. Really there was nothing that was identified as the cause for this. She did have a. Fortunately there does not appear to be any signs of infection she tells me that she has had she believes arterial studies At [facility] clinic I could not find  Those studies at this point. Nonetheless I will continue to look and see what I can find before Then. The patient also has a skin tear on her arm this occurred more recently when she bumped this at home. The patient does have a history of hypertension, coronary artery disease, and peripheral vascular disease stated. 10/28/2020 I was able to find the patient's chart currently which shows that she did have an arterial study performed in May 2019. This showed that she had a abnormal right toe brachial index and a normal left toe brachial index. She was noncompressible as far as ABIs were concerned. She did appear to have triphasic flow at that time. Unfortunately the wound that is commented on in the report that I printed off and read mentions the same wound on the ankle that we are still dealing with at this point. Unfortunately this has not healed. And its been quite sometime about 3 years now. Fortunately there does not appear to be any signs of active infection systemically at this point. I think that the patient has done well with the Santyl over the past week which is good news. Patient's caregiver which is her daughter-in-law is concerned about the fact that she really does not feel qualified to be able to change the dressings and take care of this issue. There does not appear to be any signs of anything untoward going on at this point. I think she is done a great job applying the [employer] and I think that has done a great job for the patient is for soften up some of the necrotic tissue. With that being said I think the Xeroform on the arm also has done excellent. In general I am very pleased with where we stand. And I told the patient's daughter-in- law as well that  I also feel like she has done a great job over the past week taking care of her mother. 11/11/2020 upon evaluation today patient appears to be doing well with regard to her wounds. She has been tolerating the dressing changes without  complication her daughters been applying the Santyl which has done a great job. Ho with that being said I think now that we have good arterial study showing we can go ahead and proceed with sharp debridement at this point. 11/25/2020 upon evaluation today patient appears to be doing well with regard to her wounds. The Santyl has really helped to clean things up and overall she is doing quite excellent at this point. Fortunately there does not appear to be any signs of infection which is great news and in general extremely pleased. I do believe some debridement is in order and hopefully will be able to get her into a collagen dressing after this. 12/23/2020 upon evaluation today patient appears to be doing well with regard to her wound all things considered. Unfortunately it does sound like her daughter decided to let this air out because she felt like it was getting so wet. It sounds like she got border gauze dressings instead of border foam therefore there is really Nothing to catch the excess drainage which I think is the problem they were worried about the smell. Fortunately there does not appear to be any signs of active infection which is great news. Nonetheless I do not think we want to leave this just open to air. 01/13/2021 upon evaluation today patient's wounds actually appear to be about as good as have seen since have been taking care of her. Fortunately there does not appear to be any evidence of infection which is great and overall the biggest issue I see is that of fluid buildup. I think we need to do something to try to help manage this. I think if we can control her edema we get the wounds to heal more effectively. 01/27/2021 upon evaluation today patient appears to be doing well in regard to the wounds on her right lateral malleolus as well as the left heel and the right ischial tuberosity is unfortunately a new area that has arisen since we last saw her. She tells me currently that that is  from sitting too much. With that being said this actually appears to be doing the worst of anything so far that I see today. Fortunately there does not appear to be any signs of infection this is at least good news. Compression wrap seem to be doing excellent for her as far as the legs are concerned. 02/03/2021 upon evaluation today patient appears to be doing well with regard to all of her wounds. She has been tolerating the dressing changes without complication. Fortunately there is no signs of active infection at this time. No fevers, chills, nausea, vomiting, or diarrhea. 02/10/2021 upon evaluation today patient appears to be doing well with regard to her wounds. With that being said there is some slight evidence of hypergranulation in regard to the right ankle in particular and a little bit in regard to the left heel. I think Hydrofera Blue might be a better option to go to at this point based on what I am seeing especially since both of these wounds in particular seem to be a little bit stalled. I discussed that with the patient and her daughter today. With regard to the hip this is doing great with the collagen I think will get  very close to complete closure I recommend we continue with the collagen. 02/17/2021 upon evaluation today patient appears to be doing excellent in regard to her heel ulcers. She has been tolerating the dressing changes without complication and overall I am extremely pleased with where things stand today. There does not appear to be any signs of active infection which is great news. Overall I may think that we are headed in the proper direction based on what I am seeing. 02/24/2021 upon evaluation today patient's wounds actually are showing signs of improvement in regard to her heels both are doing awesome. In regard to her hip location this is actually reopened after being closed last week and to be perfectly honest I am more concerned here about the fact that pressures  causing this issue I do not think it has anything to do with her not having the collagen in place again it was completely healed last week there was no reason for the collagen to be there. 03/03/2021 upon evaluation today patient appears to be doing well with regard to her wounds. Everything is showing signs of improvement and [name], FODGE. ([id number]) doing some much better as far as the overall size of the wound. Fortunately I think we are headed in the right direction. 03/10/2021 upon evaluation today patient appears to be doing well with regard to her wound. She is tolerating the dressing changes without complication. The left heel pretty much appears to be almost completely healed although I think it is probably can be 1 more week before I can call this 100% slough. The right ankle is significantly improved with that being said its not completely closed. With regard to the right hip this is completely closed. Overall I am very pleased with where things stand I think were getting very close to complete resolution. I discussed all this with the patient and her daughter-in-law today. 03/17/2021 upon evaluation today patient appears to be doing well with regard to her wounds. Fortunately there does not appear to be any signs of active infection which is great news and overall very pleased with where the patient stands today. I am in general thankful that overall she is making great progress here. There is good to be a little bit of debridement here to clear away some of the necrotic debris today on both wound locations. 03/24/2021 upon evaluation today patient appears to be doing excellent in regard to her wound. She has been tolerating the dressing changes without complication. Fortunately there does not appear to be any evidence of infection the left foot is completely healed this is great news. On the right at the ankle region this is still open though I do not think the alginate did quite as well  as I was hoping as far as getting this dried out. I think that we may just want to switch back to the [facility] which has done well up to this point. I was just hopeful this would wrap things up completely for [place]. 03/31/2021 upon evaluation today patient appears to be doing well with regard to her wound. This is on the right side which appears to be doing better than last week I think is definitely measuring smaller and this is great news. Overall I am very pleased with where we stand today. I do believe the [facility] is doing better for her. 04/14/2021 upon evaluation today patient appears to be doing well with regard to her wound. I am very pleased with the way it  stands I think that she is headed in the right direction. Unfortunately she is having issues with the Tubigrip neither she nor her daughter-in-law who is the primary caregiver can get this on. Subsequently that means that they have been having a very difficult time with complying with the necessary things for this left leg. With that being said I am really not sure what to do to help in that regard. We can always try a bigger that is larger size Tubigrip but at the same time that means that she may not get as good compression and it may not keep things under control the only way to know is to try. 04/21/2021 upon evaluation today patient appears to be doing well in regard to her wound. She has been tolerating the dressing changes without complication and in fact the right ankle ulcer is actually showing signs of excellent improvement I am very pleased with where things stand today. No fevers, chills, nausea, vomiting, or diarrhea. 04/28/2021 upon evaluation today patient appears to be doing well with regard to her ankle ulcer. This is actually showing signs of good improvement which is great news and overall very pleased with where we stand today. No fevers, chills, nausea, vomiting, or diarrhea. 05/05/2021 upon evaluation today  patient's wound is actually showing signs of significant improvement and overall I am extremely pleased with where we stand today. There does not appear to be any signs of active infection which is great news and overall I am extremely pleased with where we stand at this point. No fevers, chills, nausea, vomiting, or diarrhea. 05/12/2021 upon evaluation today patient appears to be doing okay in regard to her wound is measuring a little bit smaller today that is good news. With that being said she still continues to have significant issues here with an open wound on the lateral aspect of her ankle. Fortunately there does not appear to be any signs of active infection systemically which is great news. No fevers, chills, nausea, vomiting, or diarrhea. 06/13/2021 upon evaluation today patient unfortunately continues to have issues with her right leg. Specifically the ankle region where there is an open wound. Previously the wound on the left leg had completely closed. Nonetheless this left heel has reopened as well. I am going to perform some debridement in regard to the heel ulcer appears to be a fluid-filled blister underneath this area. She has not been here for such a long time due to the fact that she was very sick and they were not able to bring her out. That is the reason we have not seen her in over the past month. 06/20/2021 upon evaluation patient's wounds currently are doing well on the left heel this appears to be healed. There does not appear to be any signs of drainage at this time which is great news. No fevers, chills, nausea, vomiting, or diarrhea. On the right heel there is still an open wound but I feel like we are showing some signs of improvement this is still good to take a bit of time. 06/27/2021 upon evaluation today patient appears to be doing better in regard to her wound. This is actually showing signs of good improvement which is great news. Fortunately I do not see any signs of  infection currently which is great and overall we are finally seeing some actual decrease in the size I think that the compression wraps are making a big difference here. 12/27 some debris on the surface. No evidence of surrounding infection  we have been using collagen 07/21/2021 upon evaluation today patient appears to be doing well with regard to her wound. She has been tolerating the dressing changes without complication. Fortunately I do not see any evidence of active infection locally nor systemically at this time which is great news and overall I think the wound making excellent progress here. 08/04/2021 upon evaluation today patient's wound is showing signs of being a little bit moist and really not significantly smaller. Fortunately I do not see any signs of active infection locally nor systemically at this time that is good news. Nonetheless I do feel like she is doing quite well otherwise and her left leg is still doing awesome. 08/11/2021 upon evaluation today patient appears to be doing well with regard to her wound. She has been tolerating the dressing changes without complication. Fortunately there does not appear to be any signs of active infection at this time. The wound appears to be less macerated though I do believe she was moistening the alginate just a little bit I told her she does not need to do that when putting on a longer she voiced understanding and will not do so any longer. 08/18/2021 upon evaluation today patient's wound appears to be showing signs of being slightly larger compared to previous. We will talk in millimeters but nonetheless this is not headed in the right direction. Subsequently I am not sure exactly what the best plan will be going forward. I think Sorbact could be a good option willing to give this a try over the next week and see how things go. Unfortunately she does have a new blister on the left heel which has arisen nothing is open and I am hoping it will  reabsorb but again that may not be the case. We will have to keep a close eye on this. 2/17; right lateral malleolus which has been a difficult wound seems to be doing well. Under illumination healthy tissue this is filling in nicely using Sorbact hydrogel and a border foam dressing. [name] ([id number]) Apparently last week she developed a blister on her left medial calcaneus. Most of this is already epithelialized the use Sorbact on this as well. She is developing a threatened area on the medial aspect of her left first metatarsal head this is not open but looks like there is an irritation. They have ordered their adaptive foot wear which are arriving soon 09/01/2021 upon evaluation today patient appears to be doing very well in regard to her wounds in general. I do feel like that she is making excellent progress and overall I am extremely pleased with where we stand today. Fortunately there does not appear to be any evidence of active infection locally nor systemically at this time. Fortunately I do not see any evidence of active infection which is also. 09/08/2021 upon evaluation today patient appears to be doing well with regard to her wounds. She is tolerating the dressing changes without complication. Fortunately I do not see any evidence of active infection locally or systemically which is great news. Electronic Signature(s) Signed: 09/08/2021 4:49:38 PM By: Worthy Keeler PA-C Entered By: Worthy Keeler on 09/08/2021 16:49:37 Luffman, Audrie Downs ([id number]) -------------------------------------------------------------------------------- Physical Exam Details Patient Name: [name], [name] Date of Service: 09/08/2021 3:15 PM Medical Record Number: [id number] Patient Account Number: [id number] Date of Birth/Sex: 03/31/1928 (88 y.o. F) Treating RN: Donnamarie Poag Primary Care Provider: Viviana Simpler Other Clinician: Referring Provider: Viviana Simpler Treating Provider/Extender: Skipper Cliche in Treatment:  78 Constitutional Well-nourished and well-hydrated in no acute distress. Respiratory normal breathing without difficulty. Psychiatric this patient is able to make decisions and demonstrates good insight into disease process. Alert and Oriented x 3. pleasant and cooperative. Notes Upon inspection patient's wound bed showed signs of good granulation and epithelization at this point. Fortunately I do not see any evidence of active infection locally or systemically which is great and overall I think were very close to getting the left ankle region healed on the right this is better but still not completely back to where it needs to be. Electronic Signature(s) Signed: 09/08/2021 4:49:57 PM By: Worthy Keeler PA-C Entered By: Worthy Keeler on 09/08/2021 16:49:56 Shank, Audrie Downs ([id number]) -------------------------------------------------------------------------------- Physician Orders Details Patient Name: [name], [name] Date of Service: 09/08/2021 3:15 PM Medical Record Number: [id number] Patient Account Number: [id number] Date of Birth/Sex: 1927-07-13 (88 y.o. F) Treating RN: Donnamarie Poag Primary Care Provider: Viviana Simpler Other Clinician: Referring Provider: Viviana Simpler Treating Provider/Extender: Skipper Cliche in Treatment: 109 Verbal / Phone Orders: No Diagnosis Coding ICD-10 Coding Code Description L89.513 Pressure ulcer of right ankle, stage 3 I73.89 Other specified peripheral vascular diseases L89.623 Pressure ulcer of left heel, stage 3 I10 Essential (primary) hypertension I25.10 Atherosclerotic heart disease of native coronary artery without angina pectoris Follow-up Appointments o Return Appointment in 2 weeks. - at request o Nurse Visit as needed Bathing/ Shower/ Hygiene o May shower with wound dressing protected with water repellent cover or cast protector. o No tub bath. Anesthetic (Use 'Patient Medications' Section for  Anesthetic Order Entry) o Lidocaine applied to wound bed Edema Control - Lymphedema / Segmental Compressive Device / Other o Elevate legs to the level of the heart and pump ankles as often as possible o Elevate leg(s) parallel to the floor when sitting. o DO YOUR BEST to sleep in the bed at night. DO NOT sleep in your recliner. Long hours of sitting in a recliner leads to swelling of the legs and/or potential wounds on your backside. Off-Loading o Turn and reposition every 2 hours o Other: - float heel on pillow on bed or try BUNNY BOOT order on [place] Recommend to cover the pressure area on medial left foot with a padded dressing Recommend Silvert shoe Additional Orders / Instructions o Follow Nutritious Diet and Increase Protein Intake Wound Treatment Wound #1 - Malleolus Wound Laterality: Right, Lateral Cleanser: Soap and Water 3 x Per Week/30 Days Discharge Instructions: Gently cleanse wound with antibacterial soap, rinse and pat dry prior to dressing wounds Primary Dressing: Cutimed Sorbact 1.5x 2.38 (in/in) 3 x Per Week/30 Days Discharge Instructions: A bacteria- and fungi binding wound dressing, suitable for cavities and fistulas. It is suitable as a wound filler and allows the passage of wound exudate into a secondary dressing. The dressing helps reducing odor and pain and can improve healing. Secondary Dressing: Gauze 3 x Per Week/30 Days Discharge Instructions: dry gauze under BF Secondary Dressing: (SILICONE BORDER) Zetuvit Plus SILICONE BORDER Dressing 4x4 (in/in) (Generic) 3 x Per Week/30 Days Secured With: Tubigrip Size C, 2.75x10 (in/yd) 3 x Per Week/30 Days Discharge Instructions: Apply 3 Tubigrip C 3-finger-widths below knee to base of toes to secure dressing and/or for swelling. Wound #6 - Calcaneus Wound Laterality: Left, Medial Cleanser: Soap and Water 3 x Per Week/30 Days [name], [name] ([id number]) Discharge Instructions: Gently cleanse wound with  antibacterial soap, rinse and pat dry prior to dressing wounds Primary Dressing: Cutimed Sorbact 1.5x 2.38 (in/in) 3 x  Per Week/30 Days Discharge Instructions: A bacteria- and fungi binding wound dressing, suitable for cavities and fistulas. It is suitable as a wound filler and allows the passage of wound exudate into a secondary dressing. The dressing helps reducing odor and pain and can improve healing. Secondary Dressing: Gauze 3 x Per Week/30 Days Discharge Instructions: dry gauze under BF Secondary Dressing: (SILICONE BORDER) Zetuvit Plus SILICONE BORDER Dressing 4x4 (in/in) (Generic) 3 x Per Week/30 Days Secured With: Tubigrip Size C, 2.75x10 (in/yd) 3 x Per Week/30 Days Discharge Instructions: Apply 3 Tubigrip C 3-finger-widths below knee to base of toes to secure dressing and/or for swelling. Electronic Signature(s) Signed: 09/08/2021 4:50:54 PM By: Donnamarie Poag Signed: 09/08/2021 5:14:00 PM By: Worthy Keeler PA-C Entered By: Donnamarie Poag on 09/08/2021 15:54:52 Pinkard, Audrie Downs ([id number]) -------------------------------------------------------------------------------- Problem List Details Patient Name: [name], [name] Date of Service: 09/08/2021 3:15 PM Medical Record Number: [id number] Patient Account Number: [id number] Date of Birth/Sex: May 30, 1928 (88 y.o. F) Treating RN: Donnamarie Poag Primary Care Provider: Viviana Simpler Other Clinician: Referring Provider: Viviana Simpler Treating Provider/Extender: Skipper Cliche in Treatment: 73 Active Problems ICD-10 Encounter Code Description Active Date MDM Diagnosis L89.513 Pressure ulcer of right ankle, stage 3 10/21/2020 No Yes I73.89 Other specified peripheral vascular diseases 10/21/2020 No Yes L89.623 Pressure ulcer of left heel, stage 3 10/21/2020 No Yes I10 Essential (primary) hypertension 10/21/2020 No Yes I25.10 Atherosclerotic heart disease of native coronary artery without angina 10/21/2020 No Yes pectoris Inactive  Problems Resolved Problems ICD-10 Code Description Active Date Resolved Date S51.802A Unspecified open wound of left forearm, initial encounter 10/21/2020 10/21/2020 L89.312 Pressure ulcer of right buttock, stage 2 01/27/2021 01/27/2021 Electronic Signature(s) Signed: 09/08/2021 3:18:08 PM By: Worthy Keeler PA-C Entered By: Worthy Keeler on 09/08/2021 15:18:07 Shearman, Audrie Downs ([id number]) -------------------------------------------------------------------------------- Progress Note Details Patient Name: [name] Date of Service: 09/08/2021 3:15 PM Medical Record Number: [id number] Patient Account Number: [id number] Date of Birth/Sex: 04/29/1928 (88 y.o. F) Treating RN: Donnamarie Poag Primary Care Provider: Viviana Simpler Other Clinician: Referring Provider: Viviana Simpler Treating Provider/Extender: Skipper Cliche in Treatment: 43 Subjective Chief Complaint Information obtained from Patient Pressure ulcer right ankle and left heel History of Present Illness (HPI) 10/21/2020 upon evaluation today patient presents for initial inspection here in our clinic concerning issues that she has been having quite some time in regard to her ankle and since she has been in the hospital with regard to the heel. The ankle in fact has been 5-6 years at least I am told. With that being said the heel ulcer occurred when she was in the hospital in December for hip surgery when she fractured her hip. She also was in the hospital Tuesday for altered mental status. Really there was nothing that was identified as the cause for this. She did have a. Fortunately there does not appear to be any signs of infection she tells me that she has had she believes arterial studies At [facility] I could not find Those studies at this point. Nonetheless I will continue to look and see what I can find before Then. The patient also has a skin tear on her arm this occurred more recently when she bumped this at  home. The patient does have a history of hypertension, coronary artery disease, and peripheral vascular disease stated. 10/28/2020 I was able to find the patient's chart currently which shows that she did have an arterial study performed in May 2019. This showed that she had a abnormal  right toe brachial index and a normal left toe brachial index. She was noncompressible as far as ABIs were concerned. She did appear to have triphasic flow at that time. Unfortunately the wound that is commented on in the report that I printed off and read mentions the same wound on the ankle that we are still dealing with at this point. Unfortunately this has not healed. And its been quite sometime about 3 years now. Fortunately there does not appear to be any signs of active infection systemically at this point. I think that the patient has done well with the Santyl over the past week which is good news. Patient's caregiver which is her daughter-in-law is concerned about the fact that she really does not feel qualified to be able to change the dressings and take care of this issue. There does not appear to be any signs of anything untoward going on at this point. I think she is done a great job applying the [employer] and I think that has done a great job for the patient is for soften up some of the necrotic tissue. With that being said I think the Xeroform on the arm also has done excellent. In general I am very pleased with where we stand. And I told the patient's daughter-in- law as well that I also feel like she has done a great job over the past week taking care of her mother. 11/11/2020 upon evaluation today patient appears to be doing well with regard to her wounds. She has been tolerating the dressing changes without complication her daughters been applying the Santyl which has done a great job. Ho with that being said I think now that we have good arterial study showing we can go ahead and proceed with sharp  debridement at this point. 11/25/2020 upon evaluation today patient appears to be doing well with regard to her wounds. The Santyl has really helped to clean things up and overall she is doing quite excellent at this point. Fortunately there does not appear to be any signs of infection which is great news and in general extremely pleased. I do believe some debridement is in order and hopefully will be able to get her into a collagen dressing after this. 12/23/2020 upon evaluation today patient appears to be doing well with regard to her wound all things considered. Unfortunately it does sound like her daughter decided to let this air out because she felt like it was getting so wet. It sounds like she got border gauze dressings instead of border foam therefore there is really Nothing to catch the excess drainage which I think is the problem they were worried about the smell. Fortunately there does not appear to be any signs of active infection which is great news. Nonetheless I do not think we want to leave this just open to air. 01/13/2021 upon evaluation today patient's wounds actually appear to be about as good as have seen since have been taking care of her. Fortunately there does not appear to be any evidence of infection which is great and overall the biggest issue I see is that of fluid buildup. I think we need to do something to try to help manage this. I think if we can control her edema we get the wounds to heal more effectively. 01/27/2021 upon evaluation today patient appears to be doing well in regard to the wounds on her right lateral malleolus as well as the left heel and the right ischial tuberosity is unfortunately a  new area that has arisen since we last saw her. She tells me currently that that is from sitting too much. With that being said this actually appears to be doing the worst of anything so far that I see today. Fortunately there does not appear to be any signs of infection this  is at least good news. Compression wrap seem to be doing excellent for her as far as the legs are concerned. 02/03/2021 upon evaluation today patient appears to be doing well with regard to all of her wounds. She has been tolerating the dressing changes without complication. Fortunately there is no signs of active infection at this time. No fevers, chills, nausea, vomiting, or diarrhea. 02/10/2021 upon evaluation today patient appears to be doing well with regard to her wounds. With that being said there is some slight evidence of hypergranulation in regard to the right ankle in particular and a little bit in regard to the left heel. I think Hydrofera Blue might be a better option to go to at this point based on what I am seeing especially since both of these wounds in particular seem to be a little bit stalled. I discussed that with the patient and her daughter today. With regard to the hip this is doing great with the collagen I think will get very close to complete closure I recommend we continue with the collagen. 02/17/2021 upon evaluation today patient appears to be doing excellent in regard to her heel ulcers. She has been tolerating the dressing changes without complication and overall I am extremely pleased with where things stand today. There does not appear to be any signs of active infection which is great news. Overall I may think that we are headed in the proper direction based on what I am seeing. 02/24/2021 upon evaluation today patient's wounds actually are showing signs of improvement in regard to her heels both are doing awesome. In [place], [place] ([id number]) regard to her hip location this is actually reopened after being closed last week and to be perfectly honest I am more concerned here about the fact that pressures causing this issue I do not think it has anything to do with her not having the collagen in place again it was completely healed last week there was no reason for the  collagen to be there. 03/03/2021 upon evaluation today patient appears to be doing well with regard to her wounds. Everything is showing signs of improvement and doing some much better as far as the overall size of the wound. Fortunately I think we are headed in the right direction. 03/10/2021 upon evaluation today patient appears to be doing well with regard to her wound. She is tolerating the dressing changes without complication. The left heel pretty much appears to be almost completely healed although I think it is probably can be 1 more week before I can call this 100% slough. The right ankle is significantly improved with that being said its not completely closed. With regard to the right hip this is completely closed. Overall I am very pleased with where things stand I think were getting very close to complete resolution. I discussed all this with the patient and her daughter-in-law today. 03/17/2021 upon evaluation today patient appears to be doing well with regard to her wounds. Fortunately there does not appear to be any signs of active infection which is great news and overall very pleased with where the patient stands today. I am in general thankful that overall she is making  great progress here. There is good to be a little bit of debridement here to clear away some of the necrotic debris today on both wound locations. 03/24/2021 upon evaluation today patient appears to be doing excellent in regard to her wound. She has been tolerating the dressing changes without complication. Fortunately there does not appear to be any evidence of infection the left foot is completely healed this is great news. On the right at the ankle region this is still open though I do not think the alginate did quite as well as I was hoping as far as getting this dried out. I think that we may just want to switch back to the [facility] which has done well up to this point. I was just hopeful this would wrap  things up completely for [place]. 03/31/2021 upon evaluation today patient appears to be doing well with regard to her wound. This is on the right side which appears to be doing better than last week I think is definitely measuring smaller and this is great news. Overall I am very pleased with where we stand today. I do believe the [facility] is doing better for her. 04/14/2021 upon evaluation today patient appears to be doing well with regard to her wound. I am very pleased with the way it stands I think that she is headed in the right direction. Unfortunately she is having issues with the Tubigrip neither she nor her daughter-in-law who is the primary caregiver can get this on. Subsequently that means that they have been having a very difficult time with complying with the necessary things for this left leg. With that being said I am really not sure what to do to help in that regard. We can always try a bigger that is larger size Tubigrip but at the same time that means that she may not get as good compression and it may not keep things under control the only way to know is to try. 04/21/2021 upon evaluation today patient appears to be doing well in regard to her wound. She has been tolerating the dressing changes without complication and in fact the right ankle ulcer is actually showing signs of excellent improvement I am very pleased with where things stand today. No fevers, chills, nausea, vomiting, or diarrhea. 04/28/2021 upon evaluation today patient appears to be doing well with regard to her ankle ulcer. This is actually showing signs of good improvement which is great news and overall very pleased with where we stand today. No fevers, chills, nausea, vomiting, or diarrhea. 05/05/2021 upon evaluation today patient's wound is actually showing signs of significant improvement and overall I am extremely pleased with where we stand today. There does not appear to be any signs of active infection  which is great news and overall I am extremely pleased with where we stand at this point. No fevers, chills, nausea, vomiting, or diarrhea. 05/12/2021 upon evaluation today patient appears to be doing okay in regard to her wound is measuring a little bit smaller today that is good news. With that being said she still continues to have significant issues here with an open wound on the lateral aspect of her ankle. Fortunately there does not appear to be any signs of active infection systemically which is great news. No fevers, chills, nausea, vomiting, or diarrhea. 06/13/2021 upon evaluation today patient unfortunately continues to have issues with her right leg. Specifically the ankle region where there is an open wound. Previously the wound on the left  leg had completely closed. Nonetheless this left heel has reopened as well. I am going to perform some debridement in regard to the heel ulcer appears to be a fluid-filled blister underneath this area. She has not been here for such a long time due to the fact that she was very sick and they were not able to bring her out. That is the reason we have not seen her in over the past month. 06/20/2021 upon evaluation patient's wounds currently are doing well on the left heel this appears to be healed. There does not appear to be any signs of drainage at this time which is great news. No fevers, chills, nausea, vomiting, or diarrhea. On the right heel there is still an open wound but I feel like we are showing some signs of improvement this is still good to take a bit of time. 06/27/2021 upon evaluation today patient appears to be doing better in regard to her wound. This is actually showing signs of good improvement which is great news. Fortunately I do not see any signs of infection currently which is great and overall we are finally seeing some actual decrease in the size I think that the compression wraps are making a big difference here. 12/27 some debris  on the surface. No evidence of surrounding infection we have been using collagen 07/21/2021 upon evaluation today patient appears to be doing well with regard to her wound. She has been tolerating the dressing changes without complication. Fortunately I do not see any evidence of active infection locally nor systemically at this time which is great news and overall I think the wound making excellent progress here. 08/04/2021 upon evaluation today patient's wound is showing signs of being a little bit moist and really not significantly smaller. Fortunately I do not see any signs of active infection locally nor systemically at this time that is good news. Nonetheless I do feel like she is doing quite well otherwise and her left leg is still doing awesome. 08/11/2021 upon evaluation today patient appears to be doing well with regard to her wound. She has been tolerating the dressing changes without complication. Fortunately there does not appear to be any signs of active infection at this time. The wound appears to be less macerated though I do believe she was moistening the alginate just a little bit I told her she does not need to do that when putting on a longer she voiced understanding and will not do so any longer. 08/18/2021 upon evaluation today patient's wound appears to be showing signs of being slightly larger compared to previous. We will talk in millimeters but nonetheless this is not headed in the right direction. Subsequently I am not sure exactly what the best plan will be going forward. I think Sorbact could be a good option willing to give this a try over the next week and see how things go. Unfortunately she does have a new blister on the left heel which has arisen nothing is open and I am hoping it will reabsorb but again that may not be the case. We will have to keep [name], CHICOINE. ([id number]) a close eye on this. 2/17; right lateral malleolus which has been a difficult wound seems to  be doing well. Under illumination healthy tissue this is filling in nicely using Sorbact hydrogel and a border foam dressing. Apparently last week she developed a blister on her left medial calcaneus. Most of this is already epithelialized the use Sorbact on this as  well. She is developing a threatened area on the medial aspect of her left first metatarsal head this is not open but looks like there is an irritation. They have ordered their adaptive foot wear which are arriving soon 09/01/2021 upon evaluation today patient appears to be doing very well in regard to her wounds in general. I do feel like that she is making excellent progress and overall I am extremely pleased with where we stand today. Fortunately there does not appear to be any evidence of active infection locally nor systemically at this time. Fortunately I do not see any evidence of active infection which is also. 09/08/2021 upon evaluation today patient appears to be doing well with regard to her wounds. She is tolerating the dressing changes without complication. Fortunately I do not see any evidence of active infection locally or systemically which is great news. Objective Constitutional Well-nourished and well-hydrated in no acute distress. Vitals Time Taken: 3:38 PM, Height: 66 in, Weight: 76 lbs, BMI: 12.3, Temperature: 98.2 F, Pulse: 76 bpm, Respiratory Rate: 16 breaths/min, Blood Pressure: 173/76 mmHg. Respiratory normal breathing without difficulty. Psychiatric this patient is able to make decisions and demonstrates good insight into disease process. Alert and Oriented x 3. pleasant and cooperative. General Notes: Upon inspection patient's wound bed showed signs of good granulation and epithelization at this point. Fortunately I do not see any evidence of active infection locally or systemically which is great and overall I think were very close to getting the left ankle region healed on the right this is better but  still not completely back to where it needs to be. Integumentary (Hair, Skin) Wound #1 status is Open. Original cause of wound was Gradually Appeared. The date acquired was: 10/22/2014. The wound has been in treatment 46 weeks. The wound is located on the Right,Lateral Malleolus. The wound measures 0.6cm length x 0.6cm width x 0.1cm depth; 0.283cm^2 area and 0.028cm^3 volume. There is Fat Layer (Subcutaneous Tissue) exposed. There is no tunneling or undermining noted. There is a large amount of serosanguineous drainage noted. The wound margin is flat and intact. There is large (67-100%) pink granulation within the wound bed. There is a small (1-33%) amount of necrotic tissue within the wound bed including Adherent Slough. Wound #6 status is Open. Original cause of wound was Blister. The date acquired was: 08/18/2021. The wound has been in treatment 3 weeks. The wound is located on the Left,Medial Calcaneus. The wound measures 0.1cm length x 0.1cm width x 0.1cm depth; 0.008cm^2 area and 0.001cm^3 volume. The wound is limited to skin breakdown. There is no tunneling or undermining noted. There is a medium amount of serous drainage noted. The wound margin is flat and intact. There is large (67-100%) red, pink granulation within the wound bed. There is no necrotic tissue within the wound bed. Assessment Active Problems ICD-10 Pressure ulcer of right ankle, stage 3 Other specified peripheral vascular diseases Pressure ulcer of left heel, stage 3 Essential (primary) hypertension Atherosclerotic heart disease of native coronary artery without angina pectoris [name], [name] ([id number]) Plan Follow-up Appointments: Return Appointment in 2 weeks. - at request Nurse Visit as needed Bathing/ Shower/ Hygiene: May shower with wound dressing protected with water repellent cover or cast protector. No tub bath. Anesthetic (Use 'Patient Medications' Section for Anesthetic Order Entry): Lidocaine applied  to wound bed Edema Control - Lymphedema / Segmental Compressive Device / Other: Elevate legs to the level of the heart and pump ankles as often as possible Elevate leg(s)  parallel to the floor when sitting. DO YOUR BEST to sleep in the bed at night. DO NOT sleep in your recliner. Long hours of sitting in a recliner leads to swelling of the legs and/or potential wounds on your backside. Off-Loading: Turn and reposition every 2 hours Other: - float heel on pillow on bed or try BUNNY BOOT order on [place] to cover the pressure area on medial left foot with a padded dressing Recommend Silvert shoe Additional Orders / Instructions: Follow Nutritious Diet and Increase Protein Intake WOUND #1: - Malleolus Wound Laterality: Right, Lateral Cleanser: Soap and Water 3 x Per Week/30 Days Discharge Instructions: Gently cleanse wound with antibacterial soap, rinse and pat dry prior to dressing wounds Primary Dressing: Cutimed Sorbact 1.5x 2.38 (in/in) 3 x Per Week/30 Days Discharge Instructions: A bacteria- and fungi binding wound dressing, suitable for cavities and fistulas. It is suitable as a wound filler and allows the passage of wound exudate into a secondary dressing. The dressing helps reducing odor and pain and can improve healing. Secondary Dressing: Gauze 3 x Per Week/30 Days Discharge Instructions: dry gauze under BF Secondary Dressing: (SILICONE BORDER) Zetuvit Plus SILICONE BORDER Dressing 4x4 (in/in) (Generic) 3 x Per Week/30 Days Secured With: Tubigrip Size C, 2.75x10 (in/yd) 3 x Per Week/30 Days Discharge Instructions: Apply 3 Tubigrip C 3-finger-widths below knee to base of toes to secure dressing and/or for swelling. WOUND #6: - Calcaneus Wound Laterality: Left, Medial Cleanser: Soap and Water 3 x Per Week/30 Days Discharge Instructions: Gently cleanse wound with antibacterial soap, rinse and pat dry prior to dressing wounds Primary Dressing: Cutimed Sorbact 1.5x 2.38 (in/in)  3 x Per Week/30 Days Discharge Instructions: A bacteria- and fungi binding wound dressing, suitable for cavities and fistulas. It is suitable as a wound filler and allows the passage of wound exudate into a secondary dressing. The dressing helps reducing odor and pain and can improve healing. Secondary Dressing: Gauze 3 x Per Week/30 Days Discharge Instructions: dry gauze under BF Secondary Dressing: (SILICONE BORDER) Zetuvit Plus SILICONE BORDER Dressing 4x4 (in/in) (Generic) 3 x Per Week/30 Days Secured With: Tubigrip Size C, 2.75x10 (in/yd) 3 x Per Week/30 Days Discharge Instructions: Apply 3 Tubigrip C 3-finger-widths below knee to base of toes to secure dressing and/or for swelling. 1. Would recommend that we going continue with the wound care measures as before and the patient is in agreement with plan. This includes the use of the Sorbact which I think is doing a great job here. 2. I am also can recommend that we have the patient continue with the border foam dressing to cover which I think is also doing excellent. 3 we will also continue with the Tubigrip size C which is keeping her swelling under good control. We will see patient back for reevaluation in 1 week here in the clinic. If anything worsens or changes patient will contact our office for additional recommendations. Electronic Signature(s) Signed: 09/08/2021 4:50:44 PM By: Worthy Keeler PA-C Entered By: Worthy Keeler on 09/08/2021 16:50:44 Cargo, Audrie Downs ([id number]) -------------------------------------------------------------------------------- SuperBill Details Patient Name: [name], [name] Date of Service: 09/08/2021 Medical Record Number: [id number] Patient Account Number: [id number] Date of Birth/Sex: 02-May-1928 (88 y.o. F) Treating RN: Donnamarie Poag Primary Care Provider: Viviana Simpler Other Clinician: Referring Provider: Viviana Simpler Treating Provider/Extender: Skipper Cliche in Treatment: 46 Diagnosis  Coding ICD-10 Codes Code Description [phone] Pressure ulcer of right ankle, stage 3 I73.89 Other specified peripheral vascular diseases L89.623 Pressure ulcer of left  heel, stage 3 I10 Essential (primary) hypertension I25.10 Atherosclerotic heart disease of native coronary artery without angina pectoris Facility Procedures CPT4 Code: [id number] Description: 99213 - WOUND CARE VISIT-LEV 3 EST PT Modifier: Quantity: 1 Physician Procedures CPT4 Code: [id number] Description: 35391 - WC PHYS LEVEL 4 - EST PT Modifier: Quantity: 1 CPT4 Code: Description: ICD-10 Diagnosis Description L89.513 Pressure ulcer of right ankle, stage 3 I73.89 Other specified peripheral vascular diseases L89.623 Pressure ulcer of left heel, stage 3 I10 Essential (primary) hypertension Modifier: Quantity: Electronic Signature(s) Signed: 09/08/2021 4:51:06 PM By: Worthy Keeler PA-C Previous Signature: 09/08/2021 4:50:54 PM Version By: Donnamarie Poag Entered By: Worthy Keeler on 09/08/2021 16:51:05

## 2021-09-12 ENCOUNTER — Other Ambulatory Visit: Payer: Self-pay | Admitting: Internal Medicine

## 2021-09-12 NOTE — Telephone Encounter (Signed)
Name of Medication: MSIR ?Name of Pharmacy: [facility] ?Last Fill or Written Date and Quantity: 08-14-21 #90 ?Last Office Visit and Type: 08-17-21 ?Next Office Visit and Type: 11-20-21 ?Last Controlled Substance Agreement Date: 08-17-21 ?Last UDS: 04-27-19 ?

## 2021-09-14 ENCOUNTER — Telehealth: Payer: Self-pay

## 2021-09-14 NOTE — Progress Notes (Signed)
?  Transition CCM to Self Care ? ?Attempted contact with patient 3 times on 03/09, 03/10 and 03/13.  Unsuccessful outreach.  ? ?Patient contacted to inform they have achieved their CCM goals and no longer need to be contacted as frequently. Patient advised services will still be available to them if they would like to reach out or have any new health concerns. Verified patient had contact information to pharmacist and health concierge on hand. Patient made aware CCM services would be continued if desired. Patient consented to cancel future CCM appointments. ? ?Charlene Brooke, CPP notified ? ?Marijean Niemann, RMA ?Clinical Pharmacy Assistant ?[phone] ?

## 2021-09-15 ENCOUNTER — Encounter: Payer: PPO | Admitting: Physician Assistant

## 2021-09-18 DIAGNOSIS — I739 Peripheral vascular disease, unspecified: Secondary | ICD-10-CM | POA: Diagnosis not present

## 2021-09-18 DIAGNOSIS — F339 Major depressive disorder, recurrent, unspecified: Secondary | ICD-10-CM | POA: Diagnosis not present

## 2021-09-18 DIAGNOSIS — H5711 Ocular pain, right eye: Secondary | ICD-10-CM | POA: Diagnosis not present

## 2021-09-18 DIAGNOSIS — L89512 Pressure ulcer of right ankle, stage 2: Secondary | ICD-10-CM | POA: Diagnosis not present

## 2021-09-18 DIAGNOSIS — Z515 Encounter for palliative care: Secondary | ICD-10-CM | POA: Diagnosis not present

## 2021-09-18 DIAGNOSIS — H5461 Unqualified visual loss, right eye, normal vision left eye: Secondary | ICD-10-CM | POA: Diagnosis not present

## 2021-09-18 DIAGNOSIS — G8929 Other chronic pain: Secondary | ICD-10-CM | POA: Diagnosis not present

## 2021-09-18 DIAGNOSIS — Z8619 Personal history of other infectious and parasitic diseases: Secondary | ICD-10-CM | POA: Diagnosis not present

## 2021-09-22 ENCOUNTER — Encounter: Payer: PPO | Admitting: Physician Assistant

## 2021-09-22 ENCOUNTER — Other Ambulatory Visit: Payer: Self-pay

## 2021-09-22 DIAGNOSIS — L89513 Pressure ulcer of right ankle, stage 3: Secondary | ICD-10-CM | POA: Diagnosis not present

## 2021-09-22 DIAGNOSIS — I10 Essential (primary) hypertension: Secondary | ICD-10-CM | POA: Diagnosis not present

## 2021-09-22 DIAGNOSIS — I7389 Other specified peripheral vascular diseases: Secondary | ICD-10-CM | POA: Diagnosis not present

## 2021-09-22 DIAGNOSIS — I70233 Atherosclerosis of native arteries of right leg with ulceration of ankle: Secondary | ICD-10-CM | POA: Diagnosis not present

## 2021-09-22 DIAGNOSIS — L89623 Pressure ulcer of left heel, stage 3: Secondary | ICD-10-CM | POA: Diagnosis not present

## 2021-09-22 NOTE — Progress Notes (Addendum)
TILLEY, FAETH ([id number]) ?Visit Report for 09/22/2021 ?Chief Complaint Document Details ?Patient Name: [name], [name] ?Date of Service: 09/22/2021 3:15 PM ?Medical Record Number: [id number] ?Patient Account Number: [id number] ?Date of Birth/Sex: 16-Aug-1927 (88 y.o. F) ?Treating RN: Donnamarie Poag ?Primary Care Provider: Viviana Simpler Other Clinician: ?Referring Provider: Viviana Simpler ?Treating Provider/Extender: Jeri Cos ?Weeks in Treatment: 48 ?Information Obtained from: Patient ?Chief Complaint ?Pressure ulcer right ankle and left heel ?Electronic Signature(s) ?Signed: 09/22/2021 2:52:09 PM By: Worthy Keeler PA-C ?Entered By: Worthy Keeler on 09/22/2021 14:52:09 ?MALANIE, KOLOSKI ([id number]) ?-------------------------------------------------------------------------------- ?HPI Details ?Patient Name: [name], [name] ?Date of Service: 09/22/2021 3:15 PM ?Medical Record Number: [id number] ?Patient Account Number: [id number] ?Date of Birth/Sex: 19-Nov-1927 (88 y.o. F) ?Treating RN: Donnamarie Poag ?Primary Care Provider: Viviana Simpler Other Clinician: ?Referring Provider: Viviana Simpler ?Treating Provider/Extender: Jeri Cos ?Weeks in Treatment: 48 ?History of Present Illness ?HPI Description: 10/21/2020 upon evaluation today patient presents for initial inspection here in our clinic concerning issues that she has been ?having quite some time in regard to her ankle and since she has been in the hospital with regard to the heel. The ankle in fact has been 5-6 years ?at least I am told. With that being said the heel ulcer occurred when she was in the hospital in December for hip surgery when she fractured her ?hip. She also was in the hospital Tuesday for altered mental status. Really there was nothing that was identified as the cause for this. She did ?have a. Fortunately there does not appear to be any signs of infection she tells me that she has had she believes arterial studies At [facility] clinic ?I could not  find Those studies at this point. Nonetheless I will continue to look and see what I can find before Then. The patient also has a skin ?tear on her arm this occurred more recently when she bumped this at home. ?The patient does have a history of hypertension, coronary artery disease, and peripheral vascular disease stated. ?10/28/2020 I was able to find the patient's chart currently which shows that she did have an arterial study performed in May 2019. This showed ?that she had a abnormal right toe brachial index and a normal left toe brachial index. She was noncompressible as far as ABIs were concerned. ?She did appear to have triphasic flow at that time. Unfortunately the wound that is commented on in the report that I printed off and read ?mentions the same wound on the ankle that we are still dealing with at this point. Unfortunately this has not healed. And its been quite sometime ?about 3 years now. Fortunately there does not appear to be any signs of active infection systemically at this point. I think that the patient has ?done well with the Santyl over the past week which is good news. ?Patient's caregiver which is her daughter-in-law is concerned about the fact that she really does not feel qualified to be able to change the ?dressings and take care of this issue. There does not appear to be any signs of anything untoward going on at this point. I think she is done a ?great job applying the [employer] and I think that has done a great job for the patient is for soften up some of the necrotic tissue. With that being said ?I think the Xeroform on the arm also has done excellent. In general I am very pleased with where we stand. And I told the patient's daughter-in- ?law as well that  I also feel like she has done a great job over the past week taking care of her mother. ?11/11/2020 upon evaluation today patient appears to be doing well with regard to her wounds. She has been tolerating the dressing changes ?without  complication her daughters been applying the Santyl which has done a great job. Ho with that being said I think now that we have good ?arterial study showing we can go ahead and proceed with sharp debridement at this point. ?11/25/2020 upon evaluation today patient appears to be doing well with regard to her wounds. The Santyl has really helped to clean things up and ?overall she is doing quite excellent at this point. Fortunately there does not appear to be any signs of infection which is great news and in general ?extremely pleased. I do believe some debridement is in order and hopefully will be able to get her into a collagen dressing after this. ?12/23/2020 upon evaluation today patient appears to be doing well with regard to her wound all things considered. Unfortunately it does sound like ?her daughter decided to let this air out because she felt like it was getting so wet. It sounds like she got border gauze dressings instead of border ?foam therefore there is really ?Nothing to catch the excess drainage which I think is the problem they were worried about the smell. Fortunately there does not appear to be any ?signs of active infection which is great news. Nonetheless I do not think we want to leave this just open to air. ?01/13/2021 upon evaluation today patient's wounds actually appear to be about as good as have seen since have been taking care of her. ?Fortunately there does not appear to be any evidence of infection which is great and overall the biggest issue I see is that of fluid buildup. I think ?we need to do something to try to help manage this. I think if we can control her edema we get the wounds to heal more effectively. ?01/27/2021 upon evaluation today patient appears to be doing well in regard to the wounds on her right lateral malleolus as well as the left heel ?and the right ischial tuberosity is unfortunately a new area that has arisen since we last saw her. She tells me currently that that is  from sitting ?too much. With that being said this actually appears to be doing the worst of anything so far that I see today. Fortunately there does not appear ?to be any signs of infection this is at least good news. Compression wrap seem to be doing excellent for her as far as the legs are concerned. ?02/03/2021 upon evaluation today patient appears to be doing well with regard to all of her wounds. She has been tolerating the dressing changes ?without complication. Fortunately there is no signs of active infection at this time. No fevers, chills, nausea, vomiting, or diarrhea. ?02/10/2021 upon evaluation today patient appears to be doing well with regard to her wounds. With that being said there is some slight evidence of ?hypergranulation in regard to the right ankle in particular and a little bit in regard to the left heel. I think Hydrofera Blue might be a better option ?to go to at this point based on what I am seeing especially since both of these wounds in particular seem to be a little bit stalled. I discussed that ?with the patient and her daughter today. With regard to the hip this is doing great with the collagen I think will get  very close to complete closure ?I recommend we continue with the collagen. ?02/17/2021 upon evaluation today patient appears to be doing excellent in regard to her heel ulcers. She has been tolerating the dressing changes ?without complication and overall I am extremely pleased with where things stand today. There does not appear to be any signs of active infection ?which is great news. Overall I may think that we are headed in the proper direction based on what I am seeing. ?02/24/2021 upon evaluation today patient's wounds actually are showing signs of improvement in regard to her heels both are doing awesome. In ?regard to her hip location this is actually reopened after being closed last week and to be perfectly honest I am more concerned here about the ?fact that pressures  causing this issue I do not think it has anything to do with her not having the collagen in place again it was completely healed ?last week there was no reason for the collagen to be there. ?03/03/2021 upo

## 2021-09-22 NOTE — Progress Notes (Addendum)
Elizabeth, Downs ([id number]) ?Visit Report for 09/22/2021 ?Arrival Information Details ?Patient Name: [name], [name] ?Date of Service: 09/22/2021 3:15 PM ?Medical Record Number: [id number] ?Patient Account Number: [id number] ?Date of Birth/Sex: 13-Nov-1927 (88 y.o. F) ?Treating RN: Donnamarie Poag ?Primary Care Sian Joles: Viviana Simpler Other Clinician: ?Referring Amelda Hapke: Viviana Simpler ?Treating Maydelin Deming/Extender: Jeri Cos ?Weeks in Treatment: 48 ?Visit Information History Since Last Visit ?Added or deleted any medications: No ?Patient Arrived: [name] ?Had a fall or experienced change in No ?Arrival Time: 15:24 ?activities of daily living that may affect ?Accompanied By: daughter ?risk of falls: ?Transfer Assistance: [employer] Patient ?Hospitalized since last visit: No ?Lift ?Has Dressing in Place as Prescribed: Yes ?Patient Identification Verified: Yes ?Pain Present Now: No ?Secondary Verification Process Completed: Yes ?Patient Requires Transmission-Based No ?Precautions: ?Patient Has Alerts: Yes ?Patient Alerts: NOT DIABETIC ?TBI left .77 ?TBI right .76 ?Electronic Signature(s) ?Signed: 09/22/2021 4:01:57 PM By: Donnamarie Poag ?Entered ByDonnamarie Poag on 09/22/2021 32:95:18 ?KERSTON, LANDECK ([id number]) ?-------------------------------------------------------------------------------- ?Clinic Level of Care Assessment Details ?Patient Name: Elizabeth, Downs ?Date of Service: 09/22/2021 3:15 PM ?Medical Record Number: [id number] ?Patient Account Number: [id number] ?Date of Birth/Sex: 1928-06-22 (88 y.o. F) ?Treating RN: Donnamarie Poag ?Primary Care Niall Illes: Viviana Simpler Other Clinician: ?Referring Erika Hussar: Viviana Simpler ?Treating Jillyan Plitt/Extender: Jeri Cos ?Weeks in Treatment: 48 ?Clinic Level of Care Assessment Items ?TOOL 4 Quantity Score ?'[]'$  - Use when only an EandM is performed on FOLLOW-UP visit 0 ?ASSESSMENTS - Nursing Assessment / Reassessment ?'[]'$  - Reassessment of Co-morbidities (includes updates in patient  status) 0 ?'[]'$  - 0 ?Reassessment of Adherence to Treatment Plan ?ASSESSMENTS - Wound and Skin Assessment / Reassessment ?'[]'$  - Simple Wound Assessment / Reassessment - one wound 0 ?X- 2 5 ?Complex Wound Assessment / Reassessment - multiple wounds ?'[]'$  - 0 ?Dermatologic / Skin Assessment (not related to wound area) ?ASSESSMENTS - Focused Assessment ?'[]'$  - Circumferential Edema Measurements - multi extremities 0 ?'[]'$  - 0 ?Nutritional Assessment / Counseling / Intervention ?'[]'$  - 0 ?Lower Extremity Assessment (monofilament, tuning fork, pulses) ?'[]'$  - 0 ?Peripheral Arterial Disease Assessment (using hand held doppler) ?ASSESSMENTS - Ostomy and/or Continence Assessment and Care ?'[]'$  - Incontinence Assessment and Management 0 ?'[]'$  - 0 ?Ostomy Care Assessment and Management (repouching, etc.) ?PROCESS - Coordination of Care ?X - Simple Patient / Family Education for ongoing care 1 15 ?'[]'$  - 0 ?Complex (extensive) Patient / Family Education for ongoing care ?'[]'$  - 0 ?Staff obtains Consents, Records, Test Results / Process Orders ?'[]'$  - 0 ?Staff telephones HHA, Nursing Homes / Clarify orders / etc ?'[]'$  - 0 ?Routine Transfer to another Facility (non-emergent condition) ?'[]'$  - 0 ?Routine Hospital Admission (non-emergent condition) ?'[]'$  - 0 ?New Admissions / Biomedical engineer / Ordering NPWT, Apligraf, etc. ?'[]'$  - 0 ?Emergency Hospital Admission (emergent condition) ?X- 1 10 ?Simple Discharge Coordination ?'[]'$  - 0 ?Complex (extensive) Discharge Coordination ?PROCESS - Special Needs ?'[]'$  - Pediatric / Minor Patient Management 0 ?'[]'$  - 0 ?Isolation Patient Management ?'[]'$  - 0 ?Hearing / Language / Visual special needs ?'[]'$  - 0 ?Assessment of Community assistance (transportation, D/C planning, etc.) ?'[]'$  - 0 ?Additional assistance / Altered mentation ?'[]'$  - 0 ?Support Surface(s) Assessment (bed, cushion, seat, etc.) ?INTERVENTIONS - Wound Cleansing / Measurement ?JENESYS, CASSEUS ([id number]) ?'[]'$  - 0 ?Simple Wound Cleansing - one wound ?X- 2  5 ?Complex Wound Cleansing - multiple wounds ?X- 1 5 ?Wound Imaging (photographs - any number of wounds) ?'[]'$  - 0 ?Wound Tracing (instead of photographs) ?'[]'$  - 0 ?Simple Wound  Measurement - one wound ?X- 2 5 ?Complex Wound Measurement - multiple wounds ?INTERVENTIONS - Wound Dressings ?X - Small Wound Dressing one or multiple wounds 1 10 ?'[]'$  - 0 ?Medium Wound Dressing one or multiple wounds ?'[]'$  - 0 ?Large Wound Dressing one or multiple wounds ?X- 1 5 ?Application of Medications - topical ?'[]'$  - 0 ?Application of Medications - injection ?INTERVENTIONS - Miscellaneous ?'[]'$  - External ear exam 0 ?'[]'$  - 0 ?Specimen Collection (cultures, biopsies, blood, body fluids, etc.) ?'[]'$  - 0 ?Specimen(s) / Culture(s) sent or taken to Lab for analysis ?'[]'$  - 0 ?Patient Transfer (multiple staff / Civil Service fast streamer / Similar devices) ?'[]'$  - 0 ?Simple Staple / Suture removal (25 or less) ?'[]'$  - 0 ?Complex Staple / Suture removal (26 or more) ?'[]'$  - 0 ?Hypo / Hyperglycemic Management (close monitor of Blood Glucose) ?'[]'$  - 0 ?Ankle / Brachial Index (ABI) - do not check if billed separately ?X- 1 5 ?Vital Signs ?Has the patient been seen at the hospital within the last three years: Yes ?Total Score: 80 ?Level Of Care: New/Established - Level ?3 ?Electronic Signature(s) ?Signed: 09/22/2021 4:01:57 PM By: Donnamarie Poag ?Entered ByDonnamarie Poag on 09/22/2021 15:46:50 ?CLOVIS, WARWICK ([id number]) ?-------------------------------------------------------------------------------- ?Encounter Discharge Information Details ?Patient Name: Downs, Elizabeth ?Date of Service: 09/22/2021 3:15 PM ?Medical Record Number: [id number] ?Patient Account Number: [id number] ?Date of Birth/Sex: 1927/10/29 (88 y.o. F) ?Treating RN: Donnamarie Poag ?Primary Care Lois Ostrom: Viviana Simpler Other Clinician: ?Referring Breean Nannini: Viviana Simpler ?Treating Lavern Crimi/Extender: Jeri Cos ?Weeks in Treatment: 48 ?Encounter Discharge Information Items ?Discharge Condition: Stable ?Ambulatory  Status: [name] ?Discharge Destination: Home ?Transportation: Private Auto ?Accompanied By: daughter in law ?Schedule Follow-up Appointment: Yes ?Clinical Summary of Care: ?Electronic Signature(s) ?Signed: 09/22/2021 4:01:57 PM By: Donnamarie Poag ?Entered ByDonnamarie Poag on 09/22/2021 15:57:00 ?LANIQUE, GONZALO ([id number]) ?-------------------------------------------------------------------------------- ?Lower Extremity Assessment Details ?Patient Name: ORLINDA, SLOMSKI ?Date of Service: 09/22/2021 3:15 PM ?Medical Record Number: [id number] ?Patient Account Number: [id number] ?Date of Birth/Sex: [birth year] (88 y.o. F) ?Treating RN: Donnamarie Poag ?Primary Care Dontay Harm: Viviana Simpler Other Clinician: ?Referring Jaquis Picklesimer: Viviana Simpler ?Treating Malinda Mayden/Extender: Jeri Cos ?Weeks in Treatment: 48 ?Edema Assessment ?Assessed: [Left: Yes] [Right: Yes] ?[Left: Edema] [Right: :] ?Calf ?Left: Right: ?Point of Measurement: 33 cm From Medial Instep ?Ankle ?Left: Right: ?Point of Measurement: 9 cm From Medial Instep 20.5 cm 21 cm ?Electronic Signature(s) ?Signed: 09/22/2021 4:01:57 PM By: Donnamarie Poag ?Entered ByDonnamarie Poag on 09/22/2021 15:33:12 ?JULIE-ANN, VANMAANEN ([id number]) ?-------------------------------------------------------------------------------- ?Multi Wound Chart Details ?Patient Name: OLYVIA, GOPAL ?Date of Service: 09/22/2021 3:15 PM ?Medical Record Number: [id number] ?Patient Account Number: [id number] ?Date of Birth/Sex: 1927/08/23 (88 y.o. F) ?Treating RN: Donnamarie Poag ?Primary Care Zuhair Lariccia: Viviana Simpler Other Clinician: ?Referring Joandry Slagter: Viviana Simpler ?Treating Karilyn Wind/Extender: Jeri Cos ?Weeks in Treatment: 48 ?Vital Signs ?Height(in): 66 ?Pulse(bpm): 70 ?Weight(lbs): 76 ?Blood Pressure(mmHg): 132/56 ?Body Mass Index(BMI): 12.3 ?Temperature(??F): 98 ?Respiratory Rate(breaths/min): 16 ?Photos: [N/A:N/A] ?Wound Location: Right, Lateral Malleolus Left, Medial Calcaneus N/A ?Wounding Event: Gradually  Appeared Blister N/A ?Primary Etiology: Pressure Ulcer Abrasion N/A ?Comorbid History: Arrhythmia, Coronary Artery Arrhythmia, Coronary Artery N/A ?Disease, Hypertension, Peripheral Disease, Hypertension, Periphera

## 2021-09-29 ENCOUNTER — Encounter: Payer: PPO | Admitting: Physician Assistant

## 2021-10-01 ENCOUNTER — Encounter: Payer: Self-pay | Admitting: Emergency Medicine

## 2021-10-01 ENCOUNTER — Emergency Department: Payer: PPO

## 2021-10-01 ENCOUNTER — Other Ambulatory Visit: Payer: Self-pay

## 2021-10-01 ENCOUNTER — Inpatient Hospital Stay
Admission: EM | Admit: 2021-10-01 | Discharge: 2021-10-12 | DRG: 186 | Disposition: A | Discharge status: Skilled Nursing Facility | Payer: PPO | Attending: Internal Medicine | Admitting: Internal Medicine

## 2021-10-01 DIAGNOSIS — U071 COVID-19: Secondary | ICD-10-CM | POA: Diagnosis not present

## 2021-10-01 DIAGNOSIS — Z8673 Personal history of transient ischemic attack (TIA), and cerebral infarction without residual deficits: Secondary | ICD-10-CM | POA: Diagnosis not present

## 2021-10-01 DIAGNOSIS — Z888 Allergy status to other drugs, medicaments and biological substances status: Secondary | ICD-10-CM

## 2021-10-01 DIAGNOSIS — J90 Pleural effusion, not elsewhere classified: Secondary | ICD-10-CM | POA: Diagnosis not present

## 2021-10-01 DIAGNOSIS — R7989 Other specified abnormal findings of blood chemistry: Secondary | ICD-10-CM | POA: Diagnosis not present

## 2021-10-01 DIAGNOSIS — R54 Age-related physical debility: Secondary | ICD-10-CM | POA: Diagnosis not present

## 2021-10-01 DIAGNOSIS — R4182 Altered mental status, unspecified: Secondary | ICD-10-CM | POA: Diagnosis not present

## 2021-10-01 DIAGNOSIS — E876 Hypokalemia: Secondary | ICD-10-CM | POA: Diagnosis not present

## 2021-10-01 DIAGNOSIS — Z79891 Long term (current) use of opiate analgesic: Secondary | ICD-10-CM

## 2021-10-01 DIAGNOSIS — J9811 Atelectasis: Secondary | ICD-10-CM | POA: Diagnosis not present

## 2021-10-01 DIAGNOSIS — B0223 Postherpetic polyneuropathy: Secondary | ICD-10-CM | POA: Diagnosis not present

## 2021-10-01 DIAGNOSIS — E785 Hyperlipidemia, unspecified: Secondary | ICD-10-CM | POA: Diagnosis not present

## 2021-10-01 DIAGNOSIS — E46 Unspecified protein-calorie malnutrition: Secondary | ICD-10-CM | POA: Diagnosis present

## 2021-10-01 DIAGNOSIS — I110 Hypertensive heart disease with heart failure: Secondary | ICD-10-CM | POA: Diagnosis present

## 2021-10-01 DIAGNOSIS — F1120 Opioid dependence, uncomplicated: Secondary | ICD-10-CM | POA: Diagnosis present

## 2021-10-01 DIAGNOSIS — R404 Transient alteration of awareness: Secondary | ICD-10-CM | POA: Diagnosis not present

## 2021-10-01 DIAGNOSIS — W2201XA Walked into wall, initial encounter: Secondary | ICD-10-CM | POA: Diagnosis present

## 2021-10-01 DIAGNOSIS — Z66 Do not resuscitate: Secondary | ICD-10-CM | POA: Diagnosis present

## 2021-10-01 DIAGNOSIS — Z9882 Breast implant status: Secondary | ICD-10-CM

## 2021-10-01 DIAGNOSIS — Z853 Personal history of malignant neoplasm of breast: Secondary | ICD-10-CM

## 2021-10-01 DIAGNOSIS — G629 Polyneuropathy, unspecified: Secondary | ICD-10-CM

## 2021-10-01 DIAGNOSIS — E890 Postprocedural hypothyroidism: Secondary | ICD-10-CM | POA: Diagnosis present

## 2021-10-01 DIAGNOSIS — R531 Weakness: Secondary | ICD-10-CM | POA: Diagnosis not present

## 2021-10-01 DIAGNOSIS — Z8616 Personal history of COVID-19: Secondary | ICD-10-CM | POA: Diagnosis not present

## 2021-10-01 DIAGNOSIS — R911 Solitary pulmonary nodule: Secondary | ICD-10-CM | POA: Diagnosis present

## 2021-10-01 DIAGNOSIS — E43 Unspecified severe protein-calorie malnutrition: Secondary | ICD-10-CM | POA: Diagnosis present

## 2021-10-01 DIAGNOSIS — F1320 Sedative, hypnotic or anxiolytic dependence, uncomplicated: Secondary | ICD-10-CM | POA: Diagnosis not present

## 2021-10-01 DIAGNOSIS — I5032 Chronic diastolic (congestive) heart failure: Secondary | ICD-10-CM | POA: Diagnosis not present

## 2021-10-01 DIAGNOSIS — Z87891 Personal history of nicotine dependence: Secondary | ICD-10-CM | POA: Diagnosis not present

## 2021-10-01 DIAGNOSIS — R300 Dysuria: Secondary | ICD-10-CM | POA: Diagnosis not present

## 2021-10-01 DIAGNOSIS — J9601 Acute respiratory failure with hypoxia: Secondary | ICD-10-CM | POA: Diagnosis not present

## 2021-10-01 DIAGNOSIS — R64 Cachexia: Secondary | ICD-10-CM | POA: Diagnosis not present

## 2021-10-01 DIAGNOSIS — R823 Hemoglobinuria: Secondary | ICD-10-CM | POA: Diagnosis not present

## 2021-10-01 DIAGNOSIS — K219 Gastro-esophageal reflux disease without esophagitis: Secondary | ICD-10-CM | POA: Diagnosis present

## 2021-10-01 DIAGNOSIS — I739 Peripheral vascular disease, unspecified: Secondary | ICD-10-CM | POA: Diagnosis not present

## 2021-10-01 DIAGNOSIS — R0902 Hypoxemia: Secondary | ICD-10-CM | POA: Diagnosis present

## 2021-10-01 DIAGNOSIS — L97319 Non-pressure chronic ulcer of right ankle with unspecified severity: Secondary | ICD-10-CM | POA: Diagnosis not present

## 2021-10-01 DIAGNOSIS — Z9013 Acquired absence of bilateral breasts and nipples: Secondary | ICD-10-CM

## 2021-10-01 DIAGNOSIS — E875 Hyperkalemia: Secondary | ICD-10-CM | POA: Diagnosis not present

## 2021-10-01 DIAGNOSIS — F32A Depression, unspecified: Secondary | ICD-10-CM | POA: Diagnosis not present

## 2021-10-01 DIAGNOSIS — K5900 Constipation, unspecified: Secondary | ICD-10-CM | POA: Diagnosis not present

## 2021-10-01 DIAGNOSIS — Z681 Body mass index (BMI) 19 or less, adult: Secondary | ICD-10-CM | POA: Diagnosis not present

## 2021-10-01 DIAGNOSIS — G8929 Other chronic pain: Secondary | ICD-10-CM | POA: Diagnosis present

## 2021-10-01 DIAGNOSIS — Z85828 Personal history of other malignant neoplasm of skin: Secondary | ICD-10-CM

## 2021-10-01 DIAGNOSIS — R4781 Slurred speech: Secondary | ICD-10-CM | POA: Diagnosis not present

## 2021-10-01 DIAGNOSIS — B379 Candidiasis, unspecified: Secondary | ICD-10-CM | POA: Diagnosis not present

## 2021-10-01 DIAGNOSIS — I471 Supraventricular tachycardia, unspecified: Secondary | ICD-10-CM | POA: Diagnosis not present

## 2021-10-01 DIAGNOSIS — I959 Hypotension, unspecified: Secondary | ICD-10-CM | POA: Diagnosis not present

## 2021-10-01 DIAGNOSIS — F419 Anxiety disorder, unspecified: Secondary | ICD-10-CM | POA: Diagnosis not present

## 2021-10-01 DIAGNOSIS — B0229 Other postherpetic nervous system involvement: Secondary | ICD-10-CM | POA: Diagnosis present

## 2021-10-01 DIAGNOSIS — R3129 Other microscopic hematuria: Secondary | ICD-10-CM | POA: Diagnosis not present

## 2021-10-01 DIAGNOSIS — M1990 Unspecified osteoarthritis, unspecified site: Secondary | ICD-10-CM | POA: Diagnosis present

## 2021-10-01 DIAGNOSIS — Z90710 Acquired absence of both cervix and uterus: Secondary | ICD-10-CM

## 2021-10-01 DIAGNOSIS — Z7189 Other specified counseling: Secondary | ICD-10-CM | POA: Diagnosis not present

## 2021-10-01 DIAGNOSIS — R079 Chest pain, unspecified: Secondary | ICD-10-CM | POA: Diagnosis not present

## 2021-10-01 DIAGNOSIS — R410 Disorientation, unspecified: Secondary | ICD-10-CM | POA: Diagnosis present

## 2021-10-01 DIAGNOSIS — J984 Other disorders of lung: Secondary | ICD-10-CM | POA: Diagnosis not present

## 2021-10-01 DIAGNOSIS — Z7401 Bed confinement status: Secondary | ICD-10-CM | POA: Diagnosis not present

## 2021-10-01 DIAGNOSIS — L97329 Non-pressure chronic ulcer of left ankle with unspecified severity: Secondary | ICD-10-CM | POA: Diagnosis present

## 2021-10-01 DIAGNOSIS — I10 Essential (primary) hypertension: Secondary | ICD-10-CM | POA: Diagnosis present

## 2021-10-01 DIAGNOSIS — Z79899 Other long term (current) drug therapy: Secondary | ICD-10-CM

## 2021-10-01 DIAGNOSIS — R5381 Other malaise: Secondary | ICD-10-CM | POA: Diagnosis not present

## 2021-10-01 DIAGNOSIS — Z043 Encounter for examination and observation following other accident: Secondary | ICD-10-CM | POA: Diagnosis not present

## 2021-10-01 DIAGNOSIS — J309 Allergic rhinitis, unspecified: Secondary | ICD-10-CM | POA: Diagnosis not present

## 2021-10-01 LAB — COMPREHENSIVE METABOLIC PANEL
ALT: 10 U/L (ref 0–44)
AST: 19 U/L (ref 15–41)
Albumin: 3.5 g/dL (ref 3.5–5.0)
Alkaline Phosphatase: 78 U/L (ref 38–126)
Anion gap: 11 (ref 5–15)
BUN: 14 mg/dL (ref 8–23)
CO2: 24 mmol/L (ref 22–32)
Calcium: 8.3 mg/dL — ABNORMAL LOW (ref 8.9–10.3)
Chloride: 104 mmol/L (ref 98–111)
Creatinine, Ser: 0.64 mg/dL (ref 0.44–1.00)
GFR, Estimated: >60 mL/min (ref >60)
Glucose, Bld: 119 mg/dL — ABNORMAL HIGH (ref 70–99)
Potassium: 2.6 mmol/L — CL (ref 3.5–5.1)
Sodium: 139 mmol/L (ref 135–145)
Total Bilirubin: 0.9 mg/dL (ref 0.3–1.2)
Total Protein: 7.5 g/dL (ref 6.5–8.1)

## 2021-10-01 LAB — CBC WITH DIFFERENTIAL/PLATELET
Abs Immature Granulocytes: 0.07 10*3/uL (ref 0.00–0.07)
Basophils Absolute: 0 10*3/uL (ref 0.0–0.1)
Basophils Relative: 0 %
Eosinophils Absolute: 0 10*3/uL (ref 0.0–0.5)
Eosinophils Relative: 0 %
HCT: 37.6 % (ref 36.0–46.0)
Hemoglobin: 11.8 g/dL — ABNORMAL LOW (ref 12.0–15.0)
Immature Granulocytes: 1 %
Lymphocytes Relative: 7 %
Lymphs Abs: 0.9 10*3/uL (ref 0.7–4.0)
MCH: 28.2 pg (ref 26.0–34.0)
MCHC: 31.4 g/dL (ref 30.0–36.0)
MCV: 90 fL (ref 80.0–100.0)
Monocytes Absolute: 0.6 10*3/uL (ref 0.1–1.0)
Monocytes Relative: 5 %
Neutro Abs: 10.1 10*3/uL — ABNORMAL HIGH (ref 1.7–7.7)
Neutrophils Relative %: 87 %
Platelets: 191 10*3/uL (ref 150–400)
RBC: 4.18 MIL/uL (ref 3.87–5.11)
RDW: 13.5 % (ref 11.5–15.5)
WBC: 11.6 10*3/uL — ABNORMAL HIGH (ref 4.0–10.5)
nRBC: 0 % (ref 0.0–0.2)

## 2021-10-01 LAB — URINALYSIS, ROUTINE W REFLEX MICROSCOPIC
Bacteria, UA: NONE SEEN
Bilirubin Urine: NEGATIVE
Glucose, UA: NEGATIVE mg/dL
Ketones, ur: NEGATIVE mg/dL
Leukocytes,Ua: NEGATIVE
Nitrite: NEGATIVE
Protein, ur: 100 mg/dL — AB
Specific Gravity, Urine: 1.004 — ABNORMAL LOW (ref 1.005–1.030)
pH: 7 (ref 5.0–8.0)

## 2021-10-01 LAB — RESP PANEL BY RT-PCR (FLU A&B, COVID) ARPGX2
Influenza A by PCR: NEGATIVE
Influenza B by PCR: NEGATIVE
SARS Coronavirus 2 by RT PCR: POSITIVE — AB

## 2021-10-01 LAB — TSH: TSH: 3.384 u[IU]/mL (ref 0.350–4.500)

## 2021-10-01 MED ORDER — HYDRALAZINE HCL 10 MG PO TABS
10.0000 mg | ORAL_TABLET | Freq: Three times a day (TID) | ORAL | Status: DC | PRN
  Administered 2021-10-02: 10 mg via ORAL
  Filled 2021-10-01 (×3): qty 1

## 2021-10-01 MED ORDER — LORATADINE 10 MG PO TABS
10.0000 mg | ORAL_TABLET | Freq: Every day | ORAL | Status: DC
Start: 2021-10-01 — End: 2021-10-12
  Administered 2021-10-01 – 2021-10-12 (×12): 10 mg via ORAL
  Filled 2021-10-01 (×12): qty 1

## 2021-10-01 MED ORDER — ACYCLOVIR 400 MG PO TABS
400.0000 mg | ORAL_TABLET | Freq: Three times a day (TID) | ORAL | Status: DC
  Filled 2021-10-01: qty 1

## 2021-10-01 MED ORDER — POTASSIUM CHLORIDE 10 MEQ/100ML IV SOLN
10.0000 meq | INTRAVENOUS | Status: AC
  Administered 2021-10-01 (×4): 10 meq via INTRAVENOUS
  Filled 2021-10-01 (×4): qty 100

## 2021-10-01 MED ORDER — TOPIRAMATE 25 MG PO TABS
50.0000 mg | ORAL_TABLET | Freq: Every day | ORAL | Status: DC
  Administered 2021-10-01 – 2021-10-11 (×11): 50 mg via ORAL
  Filled 2021-10-01 (×12): qty 2

## 2021-10-01 MED ORDER — POLYETHYLENE GLYCOL 3350 17 G PO PACK
17.0000 g | PACK | Freq: Every day | ORAL | Status: DC
  Administered 2021-10-01 – 2021-10-11 (×7): 17 g via ORAL
  Filled 2021-10-01 (×11): qty 1

## 2021-10-01 MED ORDER — SODIUM CHLORIDE 0.9% FLUSH
3.0000 mL | Freq: Two times a day (BID) | INTRAVENOUS | Status: DC
  Administered 2021-10-01 – 2021-10-12 (×22): 3 mL via INTRAVENOUS

## 2021-10-01 MED ORDER — PANTOPRAZOLE SODIUM 40 MG PO TBEC
40.0000 mg | DELAYED_RELEASE_TABLET | Freq: Two times a day (BID) | ORAL | Status: DC
  Administered 2021-10-01 – 2021-10-12 (×22): 40 mg via ORAL
  Filled 2021-10-01 (×22): qty 1

## 2021-10-01 MED ORDER — LORAZEPAM 1 MG PO TABS
1.0000 mg | ORAL_TABLET | Freq: Every day | ORAL | Status: DC
  Administered 2021-10-01 – 2021-10-11 (×11): 1 mg via ORAL
  Filled 2021-10-01 (×11): qty 1

## 2021-10-01 MED ORDER — ZINC SULFATE 220 (50 ZN) MG PO CAPS
220.0000 mg | ORAL_CAPSULE | Freq: Every day | ORAL | Status: DC
  Administered 2021-10-01 – 2021-10-05 (×5): 220 mg via ORAL
  Filled 2021-10-01 (×5): qty 1

## 2021-10-01 MED ORDER — ASCORBIC ACID 500 MG PO TABS
500.0000 mg | ORAL_TABLET | Freq: Every day | ORAL | Status: DC
  Administered 2021-10-01 – 2021-10-05 (×5): 500 mg via ORAL
  Filled 2021-10-01 (×5): qty 1

## 2021-10-01 MED ORDER — AZELASTINE HCL 0.1 % NA SOLN
2.0000 | Freq: Two times a day (BID) | NASAL | Status: DC | PRN
  Filled 2021-10-01: qty 30

## 2021-10-01 MED ORDER — ACYCLOVIR 200 MG PO CAPS
400.0000 mg | ORAL_CAPSULE | Freq: Three times a day (TID) | ORAL | Status: DC
  Administered 2021-10-01 – 2021-10-11 (×31): 400 mg via ORAL
  Filled 2021-10-01 (×33): qty 2

## 2021-10-01 MED ORDER — CYCLOSPORINE 0.05 % OP EMUL
1.0000 [drp] | Freq: Two times a day (BID) | OPHTHALMIC | Status: DC
  Administered 2021-10-01 – 2021-10-12 (×17): 1 [drp] via OPHTHALMIC
  Filled 2021-10-01 (×22): qty 30

## 2021-10-01 MED ORDER — PROMETHAZINE HCL 25 MG PO TABS
12.5000 mg | ORAL_TABLET | Freq: Four times a day (QID) | ORAL | Status: DC | PRN
Start: 2021-10-01 — End: 2021-10-12
  Filled 2021-10-01: qty 1

## 2021-10-01 MED ORDER — AMLODIPINE BESYLATE 5 MG PO TABS
2.5000 mg | ORAL_TABLET | Freq: Every day | ORAL | Status: DC
  Administered 2021-10-01 – 2021-10-03 (×3): 2.5 mg via ORAL
  Filled 2021-10-01 (×4): qty 1

## 2021-10-01 MED ORDER — GUAIFENESIN-DM 100-10 MG/5ML PO SYRP
10.0000 mL | ORAL_SOLUTION | ORAL | Status: DC | PRN
  Administered 2021-10-10 (×2): 10 mL via ORAL
  Filled 2021-10-01 (×3): qty 10

## 2021-10-01 MED ORDER — HYDROCOD POLI-CHLORPHE POLI ER 10-8 MG/5ML PO SUER
5.0000 mL | Freq: Two times a day (BID) | ORAL | Status: DC | PRN
  Administered 2021-10-07 – 2021-10-11 (×3): 5 mL via ORAL
  Filled 2021-10-01 (×4): qty 5

## 2021-10-01 MED ORDER — ENOXAPARIN SODIUM 40 MG/0.4ML IJ SOSY
40.0000 mg | PREFILLED_SYRINGE | INTRAMUSCULAR | Status: DC
  Administered 2021-10-01: 40 mg via SUBCUTANEOUS
  Filled 2021-10-01 (×2): qty 0.4

## 2021-10-01 MED ORDER — MORPHINE SULFATE 15 MG PO TABS
15.0000 mg | ORAL_TABLET | Freq: Three times a day (TID) | ORAL | Status: DC | PRN
  Administered 2021-10-02 – 2021-10-12 (×23): 15 mg via ORAL
  Filled 2021-10-01 (×25): qty 1

## 2021-10-01 NOTE — Plan of Care (Signed)
?  Problem: Education: ?Goal: Knowledge of General Education information will improve ?Description: Including pain rating scale, medication(s)/side effects and non-pharmacologic comfort measures ?Outcome: Not Progressing ?  ?Problem: Clinical Measurements: ?Goal: Diagnostic test results will improve ?Outcome: Progressing ?  ?Problem: Activity: ?Goal: Risk for activity intolerance will decrease ?Outcome: Not Progressing ?  ?Problem: Nutrition: ?Goal: Adequate nutrition will be maintained ?Outcome: Not Progressing ?  ?Problem: Coping: ?Goal: Level of anxiety will decrease ?Outcome: Progressing ?  ?Problem: Elimination: ?Goal: Will not experience complications related to bowel motility ?Outcome: Progressing ?Goal: Will not experience complications related to urinary retention ?Outcome: Progressing ?  ?Problem: Pain Managment: ?Goal: General experience of comfort will improve ?Outcome: Progressing ?  ?Problem: Safety: ?Goal: Ability to remain free from injury will improve ?Outcome: Progressing ?  ?

## 2021-10-01 NOTE — ED Provider Notes (Addendum)
? ?[facility] ?Provider Note ? ? ? Event Date/Time  ? First MD Initiated Contact with Patient 10/01/21 1020   ?  (approximate) ? ? ?History  ? ?Weakness ? ? ?HPI ? ?[name] is a 88 y.o. female who presents to the ED for evaluation of Weakness ?  ?I reviewed PCP visit from 2/9.  History of anxiety, GERD.  Lives at home alone, but with frequent family assistance.  ?She presents with her son and daughter-in-law who provide all history.  They check in on her at least twice per day every day and live near her.  Patient otherwise lives by herself. ? ?They bring patient to the ED for evaluation of generalized weakness, cough and altered mentation.  Patient is unable to provide any relevant history due to her altered mentation and disorientation.  She reports feeling weak and unwell in a generalized fashion. ? ?Daughter-in-law provides majority of history as she mostly helps the patient.  She reports that for the past 3 days, she has had no appetite, no energy and has been frequently coughing.  No known fevers, falls or injuries.  No particular complaints for the patient, just a lot of sleeping and weakness. ? ?Daughter-in-law does report that patient slid and hit the wall with her left sided rib cage while upright yesterday.  No fall to the ground.  ? ?Physical Exam  ? ?Triage Vital Signs: ?ED Triage Vitals  ?Enc Vitals Group  ?   BP   ?   Pulse   ?   Resp   ?   Temp   ?   Temp src   ?   SpO2   ?   Weight   ?   Height   ?   Head Circumference   ?   Peak Flow   ?   Pain Score   ?   Pain Loc   ?   Pain Edu?   ?   Excl. in [place]?   ? ? ?Most recent vital signs: ?Vitals:  ? 10/01/21 1200 10/01/21 1300  ?BP: (!) 205/90 (!) 195/72  ?Pulse: 74 63  ?Resp: 18 18  ?Temp:    ?SpO2: 95% 94%  ? ? ?General: Frail and cachectic.  Lying on her side.  Occasionally coughing.  No distress ?CV:  Good peripheral perfusion. RRR ?Resp:  Normal effort.  Occasional coughing, sounds wet.  No distress or tachypnea.   Decreased breath sounds to the right base ?Abd:  No distention.  Soft and benign throughout ?MSK:  No deformity noted.  Tender to palpation to left lateral rib cage without overlying skin changes or signs of trauma. ?Neuro:  No focal deficits appreciated. ?Other:   ? ? ?ED Results / Procedures / Treatments  ? ?Labs ?(all labs ordered are listed, but only abnormal results are displayed) ?Labs Reviewed  ?RESP PANEL BY RT-PCR (FLU A&B, COVID) ARPGX2 - Abnormal; Notable for the following components:  ?    Result Value  ? SARS Coronavirus 2 by RT PCR POSITIVE (*)   ? All other components within normal limits  ?CBC WITH DIFFERENTIAL/PLATELET - Abnormal; Notable for the following components:  ? WBC 11.6 (*)   ? Hemoglobin 11.8 (*)   ? Neutro Abs 10.1 (*)   ? All other components within normal limits  ?URINALYSIS, ROUTINE W REFLEX MICROSCOPIC - Abnormal; Notable for the following components:  ? Color, Urine STRAW (*)   ? APPearance CLEAR (*)   ? Specific Gravity,  Urine 1.004 (*)   ? Hgb urine dipstick MODERATE (*)   ? Protein, ur 100 (*)   ? All other components within normal limits  ?COMPREHENSIVE METABOLIC PANEL - Abnormal; Notable for the following components:  ? Potassium 2.6 (*)   ? Glucose, Bld 119 (*)   ? Calcium 8.3 (*)   ? All other components within normal limits  ? ? ?EKG ?Poor quality EKG.  Sinus rhythm, rate of 80 bpm.  Normal axis.  Left bundle morphology.  No STEMI ? ? ?RADIOLOGY ?CXR reviewed by me without evidence of acute cardiopulmonary pathology. ?CT head reviewed by me without evidence of acute intracranial pathology. ?CT chest reviewed by me with evidence of right moderately large pleural effusion without obvious associated infiltrate or PTX ? ?Official radiology report(s): ?CT HEAD WO CONTRAST (5MM) ? ?Result Date: 10/01/2021 ?CLINICAL DATA:  Altered mental status EXAM: CT HEAD WITHOUT CONTRAST TECHNIQUE: Contiguous axial images were obtained from the base of the skull through the vertex without  intravenous contrast. RADIATION DOSE REDUCTION: This exam was performed according to the departmental dose-optimization program which includes automated exposure control, adjustment of the mA and/or kV according to patient size and/or use of iterative reconstruction technique. COMPARISON:  CT head 10/18/2020 FINDINGS: Brain: No acute intracranial hemorrhage, mass effect, or herniation. No extra-axial fluid collections. No evidence of acute territorial infarct. No hydrocephalus. Moderate cortical volume loss. Patchy hypodensities throughout the periventricular and subcortical white matter, likely secondary to chronic microvascular ischemic changes. Area of encephalomalacia in the right MCA territory secondary to old infarct. Vascular: Calcified plaques in the carotid siphons. Skull: Normal. Negative for fracture or focal lesion. Sinuses/Orbits: Mild-to-moderate chronic appearing mucosal thickening in the maxillary and ethmoid sinuses. Mild mucosal thickening in the sphenoid sinuses. Moderate opacification of the left mastoid air cells. Other: None. IMPRESSION: 1. No acute intracranial process identified. Chronic changes as described. 2. Moderate opacification in the left mastoid air cells. Electronically Signed   By: Ofilia Neas M.D.   On: 10/01/2021 13:41  ? ?CT Chest Wo Contrast ? ?Result Date: 10/01/2021 ?CLINICAL DATA:  Fall with left flank pain, concern for rib fracture. EXAM: CT CHEST WITHOUT CONTRAST TECHNIQUE: Multidetector CT imaging of the chest was performed following the standard protocol without IV contrast. RADIATION DOSE REDUCTION: This exam was performed according to the departmental dose-optimization program which includes automated exposure control, adjustment of the mA and/or kV according to patient size and/or use of iterative reconstruction technique. COMPARISON:  Chest radiograph dated 10/01/2021 and CT chest dated 12/09/2006. FINDINGS: Cardiovascular: Vascular calcifications are seen in  the coronary arteries and aortic arch. The heart is normal in size. No pericardial effusion. Mediastinum/Nodes: No enlarged mediastinal or axillary lymph nodes. 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 The trachea and esophagus demonstrate no significant findings. Lungs/Pleura: There is a moderate to large right and trace left pleural effusions with associated atelectasis. There is mild atelectasis/scarring in the lateral left upper lobe and right middle lobe. Biapical pleuroparenchymal scarring is noted. An 8 mm solid pulmonary nodule is seen in the superior aspect of the right lower lobe (series 3, image 59). Upper Abdomen: No acute abnormality. Musculoskeletal: A chronic compression fracture of L1 is redemonstrated with 4 mm retropulsion into the central canal. Degenerative changes are seen in the spine. There are subacute to chronic right third and fourth rib fractures. No acute rib fracture is identified. IMPRESSION: 1. No acute rib fracture is identified, however subacute to chronic right third and fourth rib fractures are noted.  2. Moderate to large right and trace left pleural effusions with associated atelectasis. 3. 8 mm right solid pulmonary nodule. Recommend a non-contrast Chest CT at 6-12 months. If patient is high risk for malignancy, recommend an additional non-contrast Chest CT at 18-24 months; if patient is low risk for malignancy a non-contrast Chest CT at 18-24 months is optional. These guidelines do not apply to immunocompromised patients and patients with cancer. Follow up in patients with significant comorbidities as clinically warranted. For lung cancer screening, adhere to Lung-RADS guidelines. Reference: Radiology. 2017; 284(1):228-43. Aortic Atherosclerosis (ICD10-I70.0). Electronically Signed   By: Zerita Boers M.D.   On: 10/01/2021 13:51  ? ?DG Chest Portable 1 View ? ?Result Date: 10/01/2021 ?CLINICAL DATA:  Pt to ED via [facility] from home for generalized weakness. Pt familly called due to decline in  pts status since yesterday. Family reports that she did have some changes in her speech 1 week ago, no other symptoms noted until today. History of arthritis and breast carcinoma. EXAM: PORTABLE CHEST [place]

## 2021-10-01 NOTE — H&P (Signed)
?History and Physical  ? ? ?[name] QQI:[id number] DOB: 1928-02-22 DOA: 10/01/2021 ? ?PCP: Venia Carbon, MD  ?Patient coming from: Home ? ?Chief Complaint: Cough and loss of balance ? ?HPI: [name] is a 88 y.o. female with medical history significant of arthritis, history of breast cancer, h/o BCC of the skin, partial thyroidectomy, GERD, HLD, h/o CVA, PAD with vascular ulcers on the left and right ankles, and recently h/o postherpetic neuralgia affecting head and right eye who presented with cough and balance issues.  Son and daughter in law are in room and provide much of the history.  Per report by the family and confirmed by the patient, [name] has been having a cough for about 2 weeks.  The cough will start out wet in the morning and progress to a dry cough.  Over the last week, she has been weaker and particularly having issues with balance.  She lost balance previously and slid against the wall on her left side and came to the hospital complaining of pain in the side and a left arm cut.  Further symptoms include generalized weakness and loss of bladder control, which they note is not like her.  She was noted to be mildly hypoxic on room air and also COVID +.  Family reports that she only goes out to the wound center (chronic arterial wound on the right lateral ankle) and to get her hair done.  No one in the family or in these sites are sick as far as they know.  Interestingly, she did test positive for COVID in November and was treated with a medication for 5 days.  At that time she was asymptomatic.  She has not tested in between then and now.  ? ?ED Course: She was noted to have a low K at 2.6.  She had a normal renal function.  WBC was 11.6 with neutrophil predominance.  UA showed moderate hemoglobinuria.  CT head showed no acute findings.  There was moderate opacification of left mastoid air cells.  She had a CT chest which showed mod to large right pleural effusion and trace left  pleural effusion along with an 12m solitary pulmonary nodule on the right.  She was admitted for the lethargy, COVID and further work up of pleural effusion.  On telemetry, she was noted to have an irregular rhythm.  Per chart review, she has a history of arrhythmia, however, this was placed in her chart in May of 2019.  The visit where this diagnosis originated does not mention it.  I think this may have been placed in the chart erroneously.  ? ?Review of Systems: As per HPI otherwise all other systems reviewed and are negative. ? ?Past Medical History:  ?Diagnosis Date  ? Arrhythmia   ? Arthritis   ? Atypical chest pain   ? Lexiscan myoview (5/11) with EF 84%, normal wall motion, small fixed apical  perfusion defect likely due to breast attenuation, no evidence for ischemia or infarction.  **Patient had an  ? Basal cell carcinoma 07/28/19 [facility]  ? R pretibial  ? [place]   ? Bilateral mastectomies 1986.  ? CVA (cerebral infarction) 10/12  ? right lacunar  ? Depression   ? Dyspnea   ? Echo (5/11) was a difficult study due to breast implants but showed normal LV and RV size and systolic function.      ? GERD (gastroesophageal reflux disease)   ? Headache   ? s/p  shingles - right side of head  ? History of partial thyroidectomy   ? Hyperlipidemia   ? Hypertension   ? in past - no current meds/issues  ? Obstruction of intestine (HCC)   ? Partial small bowel obstruction ([place]) 7/14  ? no surgery  ? Superficial basal cell carcinoma 06/18/2019  ? R mid to distal pretibial  ? Wears dentures   ? full upper  ? Zoster   ?  with Post-herpetic neuralgia  ? ? ?Past Surgical History:  ?Procedure Laterality Date  ? ABDOMINAL HYSTERECTOMY    ? BREAST SURGERY Bilateral   ? cancer - mastectomy and implant insertion  ? INTRAMEDULLARY (IM) NAIL INTERTROCHANTERIC Left 06/19/2020  ? Procedure: INTRAMEDULLARY (IM) NAIL INTERTROCHANTRIC;  Surgeon: Earnestine Leys, MD;  Location: [facility] ORS;  Service: Orthopedics;  Laterality: Left;  ?  MASTECTOMY  1980  ? bilateral  ? NECK SURGERY    ? TARSORRHAPHY Bilateral 08/16/2015  ? Procedure: MINOR TARSORRAPHY LATERAL PLACEMENT;  Surgeon: Karle Starch, MD;  Location: [place];  Service: Ophthalmology;  Laterality: Bilateral;  ? VAGINAL DELIVERY    ? ? ?Social History ? reports that she has quit smoking. She has never used smokeless tobacco. She reports that she does not drink alcohol and does not use drugs. ? ?Allergies  ?Allergen Reactions  ? Duloxetine   ?  REACTION: N/V ?Mental status change and trouble with balance  ? Cymbalta [Duloxetine Hcl]   ?  Unsure of reaction  ? Maxitrol [Neomycin-Polymyxin-Dexameth]   ?  Unsure of reaction  ? Pregabalin   ?  REACTION: SOB, swollen lips  ? Tobradex [Tobramycin-Dexamethasone]   ?  Unsure of reaction  ? ? ?Family History  ?Problem Relation Age of Onset  ? Heart disease Son   ?     open heart surgery for a blockage  ? Heart Problems Son   ? Diabetes Son   ? Parkinson's disease Son   ? ? ?Prior to Admission medications   ?Medication Sig Start Date End Date Taking? Authorizing Provider  ?acyclovir (ZOVIRAX) 400 MG tablet Take 1 tablet (400 mg total) by mouth 3 (three) times daily. 08/17/21  Yes Venia Carbon, MD  ?azelastine (ASTELIN) 0.1 % nasal spray Place 2 sprays into both nostrils 2 (two) times daily. 05/20/20  Yes [provider]  ?fexofenadine (ALLEGRA) 180 MG tablet Take 180 mg by mouth daily.   Yes [provider]  ?LORazepam (ATIVAN) 1 MG tablet TAKE 1/2 TO 1 TABLET BY MOUTH TWICE DAILY AS NEEDED FOR ANXIETY ?Patient taking differently: Take 1 mg by mouth at bedtime. TAKE 1/2 TO 1 TABLET BY MOUTH TWICE DAILY AS NEEDED FOR ANXIETY 08/14/21  Yes Viviana Simpler I, MD  ?morphine (MSIR) 15 MG tablet TAKE 1 TABLET BY MOUTH 3 TIMES DAILY AS NEEDED FOR PAIN 09/12/21  Yes Kennyth Arnold, FNP  ?Multiple Vitamin (MULTIVITAMIN) tablet Take 1 tablet by mouth once daily   Yes [provider]  ?pantoprazole (PROTONIX) 40 MG tablet TAKE  1 TABLET BY MOUTH TWICE A DAY 12/09/20  Yes Viviana Simpler I, MD  ?polyethylene glycol (MIRALAX / GLYCOLAX) packet Take 17 g by mouth at bedtime.   Yes [provider]  ?RESTASIS 0.05 % ophthalmic emulsion Place 1 drop into both eyes 2 (two) times daily. 02/24/18  Yes [provider]  ?topiramate (TOPAMAX) 50 MG tablet Take 1 tablet (50 mg total) by mouth at bedtime. 09/29/20  Yes Venia Carbon, MD  ? ? ?  Physical Exam: ?Vitals:  ? 10/01/21 1200 10/01/21 1300 10/01/21 1430 10/01/21 1730  ?BP: (!) 205/90 (!) 195/72 (!) 194/102 (!) 175/79  ?Pulse: 74 63 86 73  ?Resp: '18 18 16 13  '$ ?Temp:      ?TempSrc:      ?SpO2: 95% 94% 94% 95%  ?Weight:      ? ? ?Constitutional: NAD, appears very lethargic, sleepy ?Eyes: PERRL of the left eye.  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  She is able to move the left eye without issue ?ENMT: Mucous membranes are dry.  She has significant muscle wasting of the temporal muscles.  ?Neck: normal, supple, she has significant low muscle mass around clavicles ?Respiratory: She has decreased breath sounds to mid lung on the right and crackles at the mid lung.  She has some crackles at the left bases, no wheezing ?Cardiovascular: Somewhat irregular, normal rate.  No murmur noted, pulses intact in radial arteries.  She has a healing vascular wound which is covered on the right lateral ankle ?Abdomen: scaphoid, +BS, NT, ND ?Musculoskeletal: no clubbing / cyanosis. Decreased bulk for age.  Normal tone.  ?Skin: no rashes, lesions, ulcers on exposed skin. Senile purpura present.  She has right eye shut.  ?Neurologic: Moving all extremities without difficulty, no apparent deficit ?Psychiatric: alert, oriented.  Appears severely fatigued.  ? ? ?Labs on Admission: I have personally reviewed following labs and imaging studies ? ?CBC: ?Recent Labs  ?Lab 10/01/21 ?1040  ?WBC 11.6*  ?NEUTROABS 10.1*  ?HGB 11.8*  ?HCT 37.6  ?MCV 90.0  ?PLT 191   ? ? ?Basic Metabolic Panel: ?Recent Labs  ?Lab 10/01/21 ?1250  ?NA 139  ?K 2.6*  ?CL 104  ?CO2 24  ?GLUCOSE 119*  ?BUN 14  ?CREATININE 0.64  ?CALCIUM 8.3*  ? ? ?GFR: ?Estimated Creatinine Clearance: 27.3

## 2021-10-01 NOTE — Progress Notes (Signed)
Received call from [facility]. Patient with SVT 170's for approximately 1 minute while being turned and repositioned. Patient resumed to [place], NP notified via secure chat.  ?

## 2021-10-01 NOTE — ED Notes (Signed)
K+ 2.6, MD made aware ?

## 2021-10-01 NOTE — ED Triage Notes (Signed)
Pt to ED via [facility] from home for generalized weakness. Pt familly called due to decline in pts status since yesterday. Family reports that she did have some changes in her speech 1 week ago, no other symptoms noted until today. EMS reports pt had bilateral rhonchi. ? ?CBG 040 ?HR 70-110 ?BP 167/90 ?RR 14 ?20 G IV Right forearm  ?

## 2021-10-02 ENCOUNTER — Inpatient Hospital Stay: Payer: PPO | Admitting: Radiology

## 2021-10-02 ENCOUNTER — Inpatient Hospital Stay: Payer: PPO

## 2021-10-02 DIAGNOSIS — I5032 Chronic diastolic (congestive) heart failure: Secondary | ICD-10-CM | POA: Diagnosis present

## 2021-10-02 DIAGNOSIS — J9601 Acute respiratory failure with hypoxia: Secondary | ICD-10-CM | POA: Diagnosis not present

## 2021-10-02 DIAGNOSIS — J90 Pleural effusion, not elsewhere classified: Secondary | ICD-10-CM | POA: Diagnosis not present

## 2021-10-02 DIAGNOSIS — E876 Hypokalemia: Secondary | ICD-10-CM | POA: Diagnosis present

## 2021-10-02 DIAGNOSIS — I471 Supraventricular tachycardia: Secondary | ICD-10-CM | POA: Diagnosis not present

## 2021-10-02 DIAGNOSIS — U071 COVID-19: Secondary | ICD-10-CM | POA: Diagnosis not present

## 2021-10-02 DIAGNOSIS — F419 Anxiety disorder, unspecified: Secondary | ICD-10-CM | POA: Diagnosis present

## 2021-10-02 DIAGNOSIS — R823 Hemoglobinuria: Secondary | ICD-10-CM | POA: Diagnosis present

## 2021-10-02 HISTORY — PX: IR THORACENTESIS ASP PLEURAL SPACE W/IMG GUIDE: IMG5380

## 2021-10-02 LAB — CBC
HCT: 36.3 % (ref 36.0–46.0)
Hemoglobin: 11.9 g/dL — ABNORMAL LOW (ref 12.0–15.0)
MCH: 28.5 pg (ref 26.0–34.0)
MCHC: 32.8 g/dL (ref 30.0–36.0)
MCV: 87.1 fL (ref 80.0–100.0)
Platelets: 216 10*3/uL (ref 150–400)
RBC: 4.17 MIL/uL (ref 3.87–5.11)
RDW: 13.4 % (ref 11.5–15.5)
WBC: 10.5 10*3/uL (ref 4.0–10.5)
nRBC: 0 % (ref 0.0–0.2)

## 2021-10-02 LAB — BRAIN NATRIURETIC PEPTIDE: B Natriuretic Peptide: 458.1 pg/mL — ABNORMAL HIGH (ref 0.0–100.0)

## 2021-10-02 LAB — BODY FLUID CELL COUNT WITH DIFFERENTIAL
Eos, Fluid: 0 %
Lymphs, Fluid: 85 %
Monocyte-Macrophage-Serous Fluid: 9 %
Neutrophil Count, Fluid: 6 %
Total Nucleated Cell Count, Fluid: 2080 cu mm

## 2021-10-02 LAB — MAGNESIUM: Magnesium: 1.9 mg/dL (ref 1.7–2.4)

## 2021-10-02 LAB — BASIC METABOLIC PANEL
Anion gap: 12 (ref 5–15)
BUN: 16 mg/dL (ref 8–23)
CO2: 22 mmol/L (ref 22–32)
Calcium: 8.2 mg/dL — ABNORMAL LOW (ref 8.9–10.3)
Chloride: 106 mmol/L (ref 98–111)
Creatinine, Ser: 0.62 mg/dL (ref 0.44–1.00)
GFR, Estimated: >60 mL/min (ref >60)
Glucose, Bld: 94 mg/dL (ref 70–99)
Potassium: 2.9 mmol/L — ABNORMAL LOW (ref 3.5–5.1)
Sodium: 140 mmol/L (ref 135–145)

## 2021-10-02 LAB — PROTEIN, PLEURAL OR PERITONEAL FLUID: Total protein, fluid: 4.2 g/dL

## 2021-10-02 LAB — CK: Total CK: 62 U/L (ref 38–234)

## 2021-10-02 LAB — POTASSIUM: Potassium: 4.4 mmol/L (ref 3.5–5.1)

## 2021-10-02 LAB — AMYLASE: Amylase: 38 U/L (ref 28–100)

## 2021-10-02 LAB — GLUCOSE, PLEURAL OR PERITONEAL FLUID: Glucose, Fluid: 139 mg/dL

## 2021-10-02 MED ORDER — ADENOSINE 6 MG/2ML IV SOLN
3.0000 mg | Freq: Once | INTRAVENOUS | Status: DC
  Filled 2021-10-02: qty 1

## 2021-10-02 MED ORDER — LIDOCAINE HCL 1 % IJ SOLN
INTRAMUSCULAR | Status: AC
  Administered 2021-10-02: 6 mL
  Filled 2021-10-02: qty 20

## 2021-10-02 MED ORDER — ENSURE ENLIVE PO LIQD
237.0000 mL | Freq: Two times a day (BID) | ORAL | Status: DC
  Administered 2021-10-03 – 2021-10-09 (×13): 237 mL via ORAL

## 2021-10-02 MED ORDER — ENOXAPARIN SODIUM 30 MG/0.3ML IJ SOSY
30.0000 mg | PREFILLED_SYRINGE | INTRAMUSCULAR | Status: DC
Start: 2021-10-02 — End: 2021-10-03
  Administered 2021-10-02: 30 mg via SUBCUTANEOUS
  Filled 2021-10-02: qty 0.3

## 2021-10-02 MED ORDER — POTASSIUM CHLORIDE CRYS ER 20 MEQ PO TBCR
40.0000 meq | EXTENDED_RELEASE_TABLET | Freq: Two times a day (BID) | ORAL | Status: DC
Start: 2021-10-02 — End: 2021-10-04
  Administered 2021-10-02 – 2021-10-03 (×4): 40 meq via ORAL
  Filled 2021-10-02 (×4): qty 2

## 2021-10-02 MED ORDER — POTASSIUM CHLORIDE CRYS ER 20 MEQ PO TBCR
40.0000 meq | EXTENDED_RELEASE_TABLET | Freq: Once | ORAL | Status: AC
  Administered 2021-10-02: 40 meq via ORAL
  Filled 2021-10-02: qty 2

## 2021-10-02 MED ORDER — METOPROLOL TARTRATE 5 MG/5ML IV SOLN
5.0000 mg | Freq: Four times a day (QID) | INTRAVENOUS | Status: DC | PRN
Start: 2021-10-02 — End: 2021-10-12

## 2021-10-02 MED ORDER — METOPROLOL TARTRATE 5 MG/5ML IV SOLN
5.0000 mg | INTRAVENOUS | Status: AC
  Administered 2021-10-02: 5 mg via INTRAVENOUS
  Filled 2021-10-02: qty 5

## 2021-10-02 MED ORDER — ADULT MULTIVITAMIN W/MINERALS CH
1.0000 | ORAL_TABLET | Freq: Every day | ORAL | Status: DC
  Administered 2021-10-02 – 2021-10-12 (×11): 1 via ORAL
  Filled 2021-10-02 (×11): qty 1

## 2021-10-02 MED ORDER — FUROSEMIDE 10 MG/ML IJ SOLN
20.0000 mg | Freq: Once | INTRAMUSCULAR | Status: AC
  Administered 2021-10-02: 20 mg via INTRAVENOUS
  Filled 2021-10-02: qty 2

## 2021-10-02 MED ORDER — ACETAMINOPHEN 325 MG PO TABS
650.0000 mg | ORAL_TABLET | Freq: Four times a day (QID) | ORAL | Status: DC | PRN
  Administered 2021-10-02 – 2021-10-12 (×15): 650 mg via ORAL
  Filled 2021-10-02 (×16): qty 2

## 2021-10-02 NOTE — Consult Note (Signed)
WOC Nurse Consult Note: ?Patient receiving care in [facility]. Patient is Covid +. Primary RN, Gerald Stabs, removed the dressing to the right lateral ankle to facilitate my viewing of the wound. ?Reason for Consult: vascular wounds ?Wound type: partial thickness wound ?Pressure Injury POA: Yes ?Measurement: 1 cm x 1 cm x no measureable depth ?Wound bed: pink ?Drainage (amount, consistency, odor) none ?Periwound: thin skin, but intact ?Dressing procedure/placement/frequency: ?Use a foam dressing to cover the right lateral ankle wound. Change every 3 days and prn. ? ?Thank you for the consult.  Discussed plan of care with the bedside nurse.  [place] nurse will not follow at this time.  Please re-consult the [facility] team if needed. ? ?Val Riles, RN, MSN, CWOCN, CNS-BC, pager [phone]  ? ?  ?

## 2021-10-02 NOTE — Assessment & Plan Note (Addendum)
Continue wound care for vascular wounds.  ?Continue home pain regimen. ?

## 2021-10-02 NOTE — Evaluation (Signed)
Physical Therapy Evaluation ?Patient Details ?Name: [name] ?MRN: [id number] ?DOB: 11-09-1927 ?Today's Date: 10/02/2021 ? ?History of Present Illness ? Pt is a 88 y.o. female presenting to hospital 3/26 with c/o weakness, cough, and AMS.  Pt slid and hit wall with L sided rib cage while upright day before hospital presentation.  (+) COVID-19.  Pt admitted with pleural effusion, COVID-19 (+), hypokalemia, htn, and protein-calorie malnutrition.  PMH includes anxiety, GERD, breast CA s/p B mastectomies, BCC of skin, partial thyroidectomy, h/o CVA, PAD with vascular ulcers L and R ankles, recent h/o postherpetic neuralgia affecting head and R eye, L IMN, and neck sx.  ?Clinical Impression ? Prior to hospital admission, pt was modified independent with functional mobility (pt reports using a RW); lives alone in 1 level home with level entry; pt's daughter in law and son check on pt 2x's per day (assist with breakfast and dinner).  Pt almost oriented x4 except reported it was 23rd of March (although question some confusion during session).  Currently pt is min to mod assist to stand from recliner up to RW; min assist to ambulate a few feet recliner to bed with RW use; and min to mod assist to lay down in bed.  Pt unsteady with standing activities requiring assist to balance.  Pt reporting lightheadedness in standing but symptoms resolved laying in bed.  Pt's HR in 80's to 90's bpm and O2 sats 93% or greater on 2 L O2 via nasal cannula during sessions activities.  Generalized weakness and decreased activity tolerance noted.  Pt would benefit from skilled PT to address noted impairments and functional limitations (see below for any additional details).  Upon hospital discharge, pt would benefit from SNF.   ? ?Recommendations for follow up therapy are one component of a multi-disciplinary discharge planning process, led by the attending physician.  Recommendations may be updated based on patient status, additional  functional criteria and insurance authorization. ? ?Follow Up Recommendations Skilled nursing-short term rehab (<3 hours/day) ? ?  ?Assistance Recommended at Discharge Frequent or constant Supervision/Assistance  ?Patient can return home with the following ? A lot of help with walking and/or transfers;A lot of help with bathing/dressing/bathroom;Assistance with cooking/housework;Assist for transportation ? ?  ?Equipment Recommendations Rolling walker (2 wheels);BSC/3in1  ?Recommendations for Other Services ? OT consult  ?  ?Functional Status Assessment Patient has had a recent decline in their functional status and demonstrates the ability to make significant improvements in function in a reasonable and predictable amount of time.  ? ?  ?Precautions / Restrictions Precautions ?Precautions: Fall ?Precaution Comments: orthostatic ?Restrictions ?Weight Bearing Restrictions: No  ? ?  ? ?Mobility ? Bed Mobility ?Overal bed mobility: Needs Assistance ?Bed Mobility: Sit to Supine ?  ?  ?  ?Sit to supine: Min assist, Mod assist ?  ?General bed mobility comments: assist for B LE's ?  ? ?Transfers ?Overall transfer level: Needs assistance ?Equipment used: Rolling walker (2 wheels) ?Transfers: Sit to/from Stand, Bed to chair/wheelchair/BSC ?Sit to Stand: Min assist, Mod assist ?  ?  ?  ?  ?  ?General transfer comment: min to mod assist to stand from recliner up to RW ?  ? ?Ambulation/Gait ?Ambulation/Gait assistance: Min assist ?Gait Distance (Feet): 3 Feet (recliner to bed) ?Assistive device: Rolling walker (2 wheels) ?  ?Gait velocity: decreased ?  ?  ?General Gait Details: decreased B LE step length/foot clearance; assist to steady ? ?Stairs ?  ?  ?  ?  ?  ? ?  Wheelchair Mobility ?  ? ?Modified Rankin (Stroke Patients Only) ?  ? ?  ? ?Balance Overall balance assessment: Needs assistance ?Sitting-balance support: No upper extremity supported, Feet supported ?Sitting balance-Leahy Scale: Good ?Sitting balance - Comments:  steady sitting reaching within BOS ?  ?Standing balance support: Bilateral upper extremity supported, During functional activity ?Standing balance-Leahy Scale: Poor ?Standing balance comment: min assist for balance (B UE support on RW) ?  ?  ?  ?  ?  ?  ?  ?  ?  ?  ?  ?   ? ? ? ?Pertinent Vitals/Pain Pain Assessment ?Pain Assessment: No/denies pain  ? ? ?Home Living Family/patient expects to be discharged to:: Private residence ?Living Arrangements: Alone ?Available Help at Discharge: Family;Available PRN/intermittently ?Type of Home: Apartment ?Home Access: Level entry ?  ?  ?  ?Home Layout: One level ?Home Equipment: Conservation officer, nature (2 wheels);Shower seat - built in;Grab bars - toilet;Grab bars - tub/shower ?   ?  ?Prior Function Prior Level of Function : Independent/Modified Independent;Patient poor historian/Family not available ?  ?  ?  ?  ?  ?  ?Mobility Comments: Pt reports being modified independent ambulating with RW.  Pt also reports no recent falls. ?ADLs Comments: Pt reports she is independent with ADL's at baseline.  Pt's daughter in law and son come over to assist with breakfast and dinner (pt makes her own lunch). ?  ? ? ?Hand Dominance  ?   ? ?  ?Extremity/Trunk Assessment  ? Upper Extremity Assessment ?Upper Extremity Assessment: Generalized weakness ?  ? ?Lower Extremity Assessment ?Lower Extremity Assessment: Generalized weakness ?  ? ?Cervical / Trunk Assessment ?Cervical / Trunk Assessment: Normal  ?Communication  ? Communication: HOH  ?Cognition Arousal/Alertness: Awake/alert ?Behavior During Therapy: [facility] for tasks assessed/performed ?Overall Cognitive Status: No family/caregiver present to determine baseline cognitive functioning ?  ?  ?  ?  ?  ?  ?  ?  ?  ?  ?  ?  ?  ?  ?  ?  ?General Comments: Oriented to person, place, general situation, and month/year (pt reported it was the 23rd today). ?  ?  ? ?  ?General Comments  Nursing cleared pt for participation in physical therapy.  Pt agreeable to  PT session. ? ?  ?Exercises  Transfers  ? ?Assessment/Plan  ?  ?PT Assessment Patient needs continued PT services  ?PT Problem List Decreased strength;Decreased activity tolerance;Decreased balance;Decreased mobility;Decreased knowledge of use of DME;Decreased knowledge of precautions;Cardiopulmonary status limiting activity ? ?   ?  ?PT Treatment Interventions DME instruction;Gait training;Functional mobility training;Therapeutic activities;Therapeutic exercise;Balance training;Patient/family education   ? ?PT Goals (Current goals can be found in the Care Plan section)  ?Acute Rehab PT Goals ?Patient Stated Goal: to go home ?PT Goal Formulation: With patient ?Time For Goal Achievement: 10/16/21 ?Potential to Achieve Goals: Fair ? ?  ?Frequency Min 2X/week ?  ? ? ?Co-evaluation   ?  ?  ?  ?  ? ? ?  ?AM-PAC PT "6 Clicks" Mobility  ?Outcome Measure Help needed turning from your back to your side while in a flat bed without using bedrails?: None ?Help needed moving from lying on your back to sitting on the side of a flat bed without using bedrails?: A Lot ?Help needed moving to and from a bed to a chair (including a wheelchair)?: A Little ?Help needed standing up from a chair using your arms (e.g., wheelchair or bedside chair)?: A Lot ?Help needed to  walk in hospital room?: Total ?Help needed climbing 3-5 steps with a railing? : Total ?6 Click Score: 13 ? ?  ?End of Session Equipment Utilized During Treatment: Gait belt ?Activity Tolerance: Patient limited by fatigue ?Patient left: in bed;with call bell/phone within reach;with chair alarm set (bed alarm would not set d/t pt's low weight so chair alarm placed under pt instead) ?Nurse Communication: Mobility status;Precautions ?PT Visit Diagnosis: Unsteadiness on feet (R26.81);Other abnormalities of gait and mobility (R26.89);Muscle weakness (generalized) (M62.81);History of falling (Z91.81) ?  ? ?Time: 3419-3790 ?PT Time Calculation (min) (ACUTE ONLY): 20  min ? ? ?Charges:   PT Evaluation ?$PT Eval Low Complexity: 1 Low ?PT Treatments ?$Therapeutic Activity: 8-22 mins ?  ?   ? ?Leitha Bleak, PT ?10/02/21, 2:05 PM ? ? ?

## 2021-10-02 NOTE — Assessment & Plan Note (Addendum)
Resolved. -Continue to monitor and replete as needed 

## 2021-10-02 NOTE — Assessment & Plan Note (Addendum)
Grade 1 diastolic dysfunction seen on echocardiogram in December 2021.  Elevated BNP.  Patient did have a pleural effusion on admission. ?Clinically seems euvolemic. ?-Continue to monitor ?

## 2021-10-02 NOTE — Progress Notes (Signed)
PHARMACIST - PHYSICIAN COMMUNICATION ? ?CONCERNING:  Enoxaparin (Lovenox) for DVT Prophylaxis  ? ? ?RECOMMENDATION: ?Patient was prescribed enoxaprin '40mg'$  q24 hours for VTE prophylaxis.  ? ?Filed Weights  ? 10/01/21 1029  ?Weight: 38.6 kg (85 lb)  ? ? ?Body mass index is 14.14 kg/m?. ? ?Estimated Creatinine Clearance: 27.3 mL/min (by C-G formula based on SCr of 0.64 mg/dL). ? ?Patient is candidate for enoxaparin '30mg'$  every 24 hours based on CrCl <10m/min or Weight <45kg ? ?DESCRIPTION: ?[facility] ? ?Patient is now receiving enoxaparin 30 mg every 24 hours  ? ?NRenda Rolls PharmD, MBA ?10/02/2021 ?1:12 AM ? ?

## 2021-10-02 NOTE — Assessment & Plan Note (Addendum)
Mild.  Secondary more to pleural effusion than COVID.  Improved following thoracentesis.  We will try to wean off of oxygen. ?

## 2021-10-02 NOTE — Assessment & Plan Note (Signed)
Patient on chronic MSIR for her postherpetic neuralgia and arterial disease wounds.  Continue. ?

## 2021-10-02 NOTE — Assessment & Plan Note (Addendum)
Unclear etiology for cause.?  COVID.  Status post thoracentesis.  Repeat chest x-ray 3/29 looking for any reaccumulation.  Shortness of breath better following thoracentesis.  Weaned off of oxygen. ?-Continue to monitor. ?

## 2021-10-02 NOTE — Hospital Course (Addendum)
88 year old female with past medical history of severe protein calorie malnutrition, anxiety disorder and chronic pain from postherpetic neuralgia and peripheral arterial disease who goes to the wound care center who reportedly had COVID back in November and brought in on 3/26 for cough and loss of balance.  Patient found to be mildly hypoxic with a large right pleural effusion and COVID-positive.  Patient also found to be hypokalemic as well as hemoglobinuria.  Admitted to the hospitalist service. ? ?On morning of 3/27, patient had episode of persistent SVT requiring 1 dose of IV Lopressor which resolved the issue.  That afternoon, interventional radiology performed right-sided thoracentesis removing 450 cc of fluid.  On early morning of 3/28, patient complaining of some chest pain.  Troponins noted minimal elevation.  Cardiology reviewed case, but felt that given her advanced age and minimal change in troponin, no intervention recommended at this time.  Patient seen by palliative care, and plan is to proceed with hospice placement if she declines. ? ?Currently stable-we will proceed for SNF. ? ?Patient was tested positive for COVID-19 on 10/01/2021 but CT value was 42.8 which means it is a prior infection.  Symptoms for the past couple of weeks and a prior positive in November 2022. ?Clinically there is no need for isolation and she should be able to go to rehab earlier. ?Current plan is to send her to rehab on 10/12/2021 after completing 10 days of quarantine as required by the rehab. ? ?4/3: Patient was complaining of some dysuria.  UA and urine culture ordered. ? ?4/4: UA with mild microscopic hematuria and urine cultures with insignificant growth. ?Patient was complaining of headache and constipation. ? ?4/5: Patient was complaining of some pain around the lower rib cage, increased with deep breathing.  Chest x-ray was obtained which shows minimal bibasilar subsegmental atelectasis with small bilateral pleural  effusions.  Lidocaine patch was applied which resulted in improvement of pain.  Most likely secondary to staying in bed in some awkward position as pain appears more musculoskeletal.  Encourage patient to stay out of bed. ? ?4/6: Patient completed the quarantine.  2 days ago, remained stable.  She is being discharged to rehab for further management.  She was started on low-dose hydralazine and amlodipine for blood pressure control in the hospital and continue with those medications. ? ?She will continue current medications and follow-up with her primary care provider within a week for further recommendations. ?

## 2021-10-02 NOTE — Assessment & Plan Note (Signed)
Continue Topamax and MSIR ?

## 2021-10-02 NOTE — Care Management (Signed)
Attempted to see patient, care team at bedside for medical workup for elevated heart rate.  TOC will follow up ?

## 2021-10-02 NOTE — Assessment & Plan Note (Addendum)
Following admission, patient went into episodes of SVT with a heart rate as high as 160s.  Likely set off by hypokalemia and large pleural effusion.  Gave 1 dose of IV Lopressor which seem to have resolved situation.  IV Lopressor made as needed. ?-Remains stable ?

## 2021-10-02 NOTE — Consult Note (Signed)
PHARMACY CONSULT NOTE - FOLLOW UP ? ?Pharmacy Consult for Electrolyte Monitoring and Replacement  ? ?Recent Labs: ?Potassium (mmol/L)  ?Date Value  ?10/02/2021 2.9 (L)  ?01/22/2013 3.8  ? ?Magnesium (mg/dL)  ?Date Value  ?10/02/2021 1.9  ? ?Calcium (mg/dL)  ?Date Value  ?10/02/2021 8.2 (L)  ? ?Calcium, Total (mg/dL)  ?Date Value  ?01/19/2013 8.3 (L)  ? ?Albumin (g/dL)  ?Date Value  ?10/01/2021 3.5  ?01/17/2013 3.9  ? ?Phosphorus (mg/dL)  ?Date Value  ?06/21/2020 2.1 (L)  ? ?Sodium (mmol/L)  ?Date Value  ?10/02/2021 140  ?01/27/2018 141  ?01/19/2013 142  ? ? ? ?Assessment: ?88 y.o. female with medical history significant of arthritis, breast cancer, h/o BCC of the skin, partial thyroidectomy, GERD, HLD, h/o CVA, PAD with vascular ulcers on the left and right ankles, and recent h/o postherpetic neuralgia affecting head and right eye who presented with cough and balance issues. Pharmacy has been consulted for electrolyte management.  ? ?Goal of Therapy:  ?Electrolytes WNL  ? ?Plan:  ?K+ 2.9, potassium chloride 40 mEq ordered BID with an additional one time order of 40 mEq  ?F/u repeat potassium today at 1800 ?F/u electrolytes with AM labs  ? ?Darnelle Bos ,PharmD ?Clinical Pharmacist ?10/02/2021 1:39 PM ? ?

## 2021-10-02 NOTE — Procedures (Signed)
PROCEDURE SUMMARY: ? ?Successful US guided right thoracentesis. ?Yielded 450 mL of clear yellow fluid. ?Pt tolerated procedure well. ?No immediate complications. ? ?Specimen was sent for labs. ?CXR ordered. ? ?EBL < 1 mL ? ?Tsosie Billing D PA-C ?10/02/2021 ?4:09 PM ? ? ? ?

## 2021-10-02 NOTE — Evaluation (Signed)
Occupational Therapy Evaluation ?Patient Details ?Name: [name] ?MRN: [id number] ?DOB: 1928-03-31 ?Today's Date: 10/02/2021 ? ? ?History of Present Illness Pt is a 88 y.o. female presenting to hospital 3/26 with c/o weakness, cough, and AMS.  Pt slid and hit wall with L sided rib cage while upright day before hospital presentation.  (+) COVID-19.  Pt admitted with pleural effusion, COVID-19 (+), hypokalemia, htn, and protein-calorie malnutrition.  PMH includes anxiety, GERD, breast CA s/p B mastectomies, BCC of skin, partial thyroidectomy, h/o CVA, PAD with vascular ulcers L and R ankles, recent h/o postherpetic neuralgia affecting head and R eye, L IMN, and neck sx.  ? ?Clinical Impression ?  ?Pt seen for OT evaluation this date (resting HR 76bpm). Upon arrival to room, pt awake in bed, incontinent of urine, and reporting urge to have BM. Pt agreeable to assistance from this author. Pt reports that at baseline she lives alone and performs ADLs independently. Per chart review/previous hospital encounter, pt was living in an apartment with level entry alone and had family checking on her 2x/day. No family present confirm PLOF/home set-up. Pt currently requires MAX A for bed mobility, MAX A for stand pivot transfer to [facility], and MAX A for clothing management/peri-care d/t decreased strength and activity tolerance. ? ?Of note, pt (+) for orthostatic hypotension following supine>sit transfer (unable to obtain reading in standing d/t pt endorsing worsening dizziness during stand pivot transfer to/from [facility]). At end of session, pt left sitting upright in recliner, with all vitals returning to resting rates. RN & MD informed. Pt would benefit from additional skilled OT services to maximize return to PLOF and minimize risk of future falls, injury, caregiver burden, and readmission. Upon discharge, recommend SNF as pt requires significant more assist for ADLs/functional mobility compared to baseline.  ? ?Orthostatic VS for  the past 24 hrs (Last 3 readings): ? BP- Lying Pulse- Lying BP- Sitting Pulse- Sitting  ?10/02/21 1120 166/80 76 (!) 135/99 92  ?10/02/21 0110 -- -- 141/78 95  ?10/02/21 0108 153/81 79 -- --  ?  ?   ? ?Recommendations for follow up therapy are one component of a multi-disciplinary discharge planning process, led by the attending physician.  Recommendations may be updated based on patient status, additional functional criteria and insurance authorization.  ? ?Follow Up Recommendations ? Skilled nursing-short term rehab (<3 hours/day)  ?  ?Assistance Recommended at Discharge Frequent or constant Supervision/Assistance  ?Patient can return home with the following A lot of help with walking and/or transfers;A lot of help with bathing/dressing/bathroom;Assistance with cooking/housework;Direct supervision/assist for medications management ? ?  ?Functional Status Assessment ? Patient has had a recent decline in their functional status and demonstrates the ability to make significant improvements in function in a reasonable and predictable amount of time.  ?Equipment Recommendations ? Other (comment) (defer to next venue of care)  ?  ?   ?Precautions / Restrictions Precautions ?Precautions: Fall ?Precaution Comments: orthostatic ?Restrictions ?Weight Bearing Restrictions: No  ? ?  ? ?Mobility Bed Mobility ?Overal bed mobility: Needs Assistance ?Bed Mobility: Supine to Sit ?  ?  ?Supine to sit: Max assist ?  ?  ?General bed mobility comments: Requires assist for managing BLE and trunk ?  ? ?Transfers ?Overall transfer level: Needs assistance ?Equipment used: Rolling walker (2 wheels) ?Transfers: Sit to/from Stand, Bed to chair/wheelchair/BSC ?Sit to Stand: Max assist ?Stand pivot transfers: Max assist ?  ?  ?  ?  ?General transfer comment: Requires cues for safe UE placement  with RW use ?  ? ?  ?Balance Overall balance assessment: Needs assistance ?Sitting-balance support: Bilateral upper extremity supported, Feet  supported ?Sitting balance-Leahy Scale: Poor ?Sitting balance - Comments: Requires MIN A to maintain static sitting balance at EOB ?Postural control: Right lateral lean ?Standing balance support: Bilateral upper extremity supported, During functional activity ?Standing balance-Leahy Scale: Poor ?Standing balance comment: Requires MOD A for static standing balance ?  ?  ?  ?  ?  ?  ?  ?  ?  ?  ?  ?   ? ?ADL either performed or assessed with clinical judgement  ? ?ADL Overall ADL's : Needs assistance/impaired ?  ?  ?Grooming: Wash/dry hands;Supervision/safety;Sitting ?Grooming Details (indicate cue type and reason): to wash hands with washcloth ?  ?  ?  ?  ?  ?  ?Lower Body Dressing: Maximal assistance;Sit to/from stand ?Lower Body Dressing Details (indicate cue type and reason): to doff soiled underwear ?Toilet Transfer: Maximal assistance;Stand-pivot;BSC/3in1;Rolling walker (2 wheels) ?Toilet Transfer Details (indicate cue type and reason): Requires cues for safe UE placement with RW use ?Toileting- Clothing Manipulation and Hygiene: Maximal assistance;Sit to/from stand ?Toileting - Clothing Manipulation Details (indicate cue type and reason): Requires MAX A to for peri-care ?  ?  ?  ?   ? ? ? ?Vision Baseline Vision/History: 1 Wears glasses (Pt reports she is unable to see out of one eye) ?Ability to See in Adequate Light: 2 Moderately impaired ?Patient Visual Report: No change from baseline ?   ?   ?   ?   ? ?Pertinent Vitals/Pain Pain Assessment ?Pain Assessment: No/denies pain  ? ? ? ?   ?Extremity/Trunk Assessment Upper Extremity Assessment ?Upper Extremity Assessment: Generalized weakness ?  ?Lower Extremity Assessment ?Lower Extremity Assessment: Generalized weakness ?  ?Cervical / Trunk Assessment ?Cervical / Trunk Assessment: Normal ?  ?Communication Communication ?Communication: HOH ?  ?Cognition Arousal/Alertness: Awake/alert ?Behavior During Therapy: [facility] for tasks assessed/performed ?Overall Cognitive  Status: No family/caregiver present to determine baseline cognitive functioning ?  ?  ?  ?  ?  ?  ?  ?  ?  ?  ?  ?  ?  ?  ?  ?  ?  ?  ?  ?   ?   ?   ? ? ?Home Living Family/patient expects to be discharged to:: Private residence ?Living Arrangements: Alone ?Available Help at Discharge: Family;Available PRN/intermittently ?Type of Home: Apartment ?Home Access: Level entry ?  ?  ?Home Layout: One level ?  ?  ?Bathroom Shower/Tub: Walk-in shower ?  ?Bathroom Toilet: Handicapped height ?  ?  ?Home Equipment: Conservation officer, nature (2 wheels);Shower seat - built in;Grab bars - toilet;Grab bars - tub/shower ?  ?  ?  ? ?  ?Prior Functioning/Environment Prior Level of Function : Independent/Modified Independent;Patient poor historian/Family not available ?  ?  ?  ?  ?  ?  ?Mobility Comments: Pt reports being modified independent ambulating with RW.  Pt also reports no recent falls. ?ADLs Comments: Pt reports she is independent with ADL's at baseline.  Pt's daughter in law and son come over to assist with breakfast and dinner (pt makes her own lunch). ?  ? ?  ?  ?OT Problem List: Decreased strength;Decreased activity tolerance;Impaired balance (sitting and/or standing);Impaired vision/perception ?  ?   ?OT Treatment/Interventions: Self-care/ADL training;Therapeutic exercise;DME and/or AE instruction;Therapeutic activities;Patient/family education;Balance training  ?  ?OT Goals(Current goals can be found in the care plan section) Acute Rehab OT Goals ?Patient  Stated Goal: to rest ?OT Goal Formulation: With patient ?Time For Goal Achievement: 10/16/21 ?Potential to Achieve Goals: Fair ?ADL Goals ?Pt Will Perform Lower Body Dressing: with mod assist;sit to/from stand ?Pt Will Transfer to Toilet: with min assist;stand pivot transfer;bedside commode ?Pt Will Perform Toileting - Clothing Manipulation and hygiene: with min assist;sitting/lateral leans  ?OT Frequency: Min 2X/week ?  ? ?   ?AM-PAC OT "6 Clicks" Daily Activity     ?Outcome  Measure Help from another person eating meals?: A Little ?Help from another person taking care of personal grooming?: A Little ?Help from another person toileting, which includes using toliet, bedpan, or urinal?: T

## 2021-10-02 NOTE — Progress Notes (Signed)
[facility] Progress Note ? ?Patient: [name]    EYC:[id number]  DOA: 10/01/2021    ?Date of Service: the patient was seen and examined on 10/02/2021 ? ?Brief hospital course: ?88 year old female with past medical history of severe protein calorie malnutrition, anxiety disorder and chronic pain from postherpetic neuralgia and peripheral arterial disease who goes to the wound care center who reportedly had COVID back in November and brought in on 3/26 for cough and loss of balance.  Patient found to be mildly hypoxic with a large left pleural effusion and COVID-positive.  Patient also found to be hypokalemic as well as hemoglobinuria.  Admitted to the hospitalist service. ? ?Assessment and Plan: ?Assessment and Plan: ?* Pleural effusion ?Unclear etiology for cause.?  COVID.  Interventional radiology consulted for thoracentesis ? ?Acute respiratory failure with hypoxia ([place]) ?Mild.  Secondary more to pleural effusion than COVID.  Hopefully will improve after tap. ? ?SVT (supraventricular tachycardia) ([place]) ?Following admission, patient went into episodes of SVT with a heart rate as high as 160s.  Likely set off by hypokalemia and large pleural effusion.  Gave 1 dose of IV Lopressor which seem to have resolved situation.  IV Lopressor made as needed. ? ?Hypokalemia ?Not much improvement despite aggressive replacement yesterday.  Checking magnesium level.  Continue to replace. ? ?Chronic diastolic CHF (congestive heart failure) ([place]) ?Grade 1 diastolic dysfunction seen on echocardiogram in December 2021.  Elevated BNP.  We will try some IV Lasix although with her poor albumin, she will likely have some third spacing.  In addition, some of her elevated BNP may be due to pleural effusion.  This may account for patient's persistently elevated blood pressure. ? ?Protein-calorie malnutrition ([place]) ?Nutrition to see.  BMI only 14.  High mortality risk. ?Nutrition Status: ?Nutrition Problem: Increased nutrient  needs ?Etiology: acute illness ?Signs/Symptoms: meal completion < 25% ?Interventions: Ensure Enlive (each supplement provides 350kcal and 20 grams of protein), MVI, Magic cup ? ? ? ? ?Hemoglobinuria ?Noted hemoglobinuria without too many red cells.  Normal CK level.  Could be secondary to prolonged immobilization versus COVID.  Will treat with fluids. ? ?Hypertension ?Not on any blood pressure medications at home.  Blood pressure elevated here.  Started on hydralazine p.o.  Improved somewhat.  We will also try some Lasix. ? ?Anxiety disorder ?Continue benzos ? ?Narcotic dependence ([place]) ?Patient on chronic MSIR for her postherpetic neuralgia and arterial disease wounds.  Continue. ? ?Postherpetic neuralgia ?Continue Topamax and MSIR ? ?PAD (peripheral artery disease) ([place]) ?Continue wound care for vascular wounds.  Continue home pain regimen. ? ? ? ? ? ? ?Body mass index is 14.14 kg/m?[name]  ?Nutrition Problem: Increased nutrient needs ?Etiology: acute illness ?Pressure Injury Ankle Anterior;Right (Active)  ?   ?Location: Ankle  ?Location Orientation: Anterior;Right  ?Staging:   ?Wound Description (Comments):   ?Present on Admission:   ?  ? ?Consultants: ?Interventional radiology ?Palliative care ? ?Procedures: ?Thoracentesis planned ? ?Antimicrobials: ?None ? ?Code Status: Full code ? ? ?Subjective: Patient complains of hurting all over, hard to breathe ? ?Objective: ?Noted elevated blood pressures ?Vitals:  ? 10/02/21 1122 10/02/21 1126  ?BP: (!) 159/90   ?Pulse: 81 82  ?Resp:    ?Temp:    ?SpO2:    ? ? ?Intake/Output Summary (Last 24 hours) at 10/02/2021 1553 ?Last data filed at 10/01/2021 2200 ?Gross per 24 hour  ?Intake 223 ml  ?Output --  ?Net 223 ml  ? ?Filed Weights  ? 10/01/21 1029  ?Weight:  38.6 kg  ? ?Body mass index is 14.14 kg/m?. ? ?Exam: ? ?General: Oriented x2, mild distress secondary to pain ?HEENT: Emaciated, frontal bossing.  Mucous membranes are dry ?Cardiovascular: Regular rate and rhythm, borderline  tachycardia ?Respiratory: Significantly decreased breath sounds on left side ?Abdomen: Soft, nontender, nondistended, hypoactive bowel sounds ?Musculoskeletal: No clubbing or cyanosis or edema ?Skin: Noted peripheral ulcers ?Psychiatry: Appropriate, no evidence of psychoses ?Neurology: No focal deficits ? ?Data Reviewed: ?Today's labs noting BNP of 458 and potassium of 2.9 ? ?Disposition:  ?Status is: Inpatient ?Remains inpatient appropriate because: Further medical stabilization, need for thoracentesis ?  ? ?Anticipated discharge date: 3/31 ? ?Remaining issues to be resolved so that patient can be discharged: Thoracentesis, goals of care ? ? ?Family Communication: Updated son by phone ?DVT Prophylaxis: ?enoxaparin (LOVENOX) injection 30 mg Start: 10/02/21 2115 ? ? ? ?Author: ?Annita Brod ,MD ?10/02/2021 3:53 PM ? ?To reach On-call, see care teams to locate the attending and reach out via [email or web address]. ?Between 7PM-7AM, please contact night-coverage ?If you still have difficulty reaching the attending provider, please page the [facility] (Director on Call) for [facility] on amion for assistance. ? ?

## 2021-10-02 NOTE — Progress Notes (Signed)
Initial Nutrition Assessment ? ?DOCUMENTATION CODES:  ? ?Underweight ? ?INTERVENTION:  ?- Encourage PO intake ? ?- Ensure Enlive po BID, each supplement provides 350 kcal and 20 grams of protein. ? ?- Magic cup TID with meals, each supplement provides 290 kcal and 9 grams of protein ? ?- MVI with minerals daily ? ?NUTRITION DIAGNOSIS:  ? ?Increased nutrient needs related to acute illness as evidenced by meal completion < 25%. ? ?GOAL:  ? ?Patient will meet greater than or equal to 90% of their needs ? ?MONITOR:  ? ?PO intake, Supplement acceptance, Labs, Weight trends ? ?REASON FOR ASSESSMENT:  ? ?Consult ?Assessment of nutrition requirement/status ? ?ASSESSMENT:  ? ?Pt admitted with cough x 2 weeks and loss of balance secondary to pleural effusion. Noted to be hypoxic on room air and COVID+. PMH significant for arthritis, breast CA, BCC of the skin, partial thyroidectomy, GERD, HLD, CVA, PAD with vascular ulcers on L/R ankles and recent h/o postherpetic neuralgia affecting head and R eye.  ? ?Noted plans for possible IR guided thoracentesis today. ? ?Unsuccessful attempt to reach pt via phone.  ?1 documented meal completion of 5%. Will order nutrition supplements and follow up with pt as appropriate for more detailed nutrition and weight history. ? ?Reviewed weight history. Weight has remained stable. Current weight noted to be 85 lbs. ? ?Medications: vitamin c, protonix, miralax, KLOR-CON, zinc ? ?Labs: potassium 2.9, calcium 8.2 ? ?NUTRITION - FOCUSED PHYSICAL EXAM: ?RD working remotely- deferred to follow up ? ?Diet Order:   ?Diet Order   ? ?       ?  DIET DYS 3 Room service appropriate? Yes; Fluid consistency: Thin  Diet effective now       ?  ? ?  ?  ? ?  ? ?EDUCATION NEEDS:  ? ?No education needs have been identified at this time ? ?Skin:  Skin Assessment: Skin Integrity Issues: ?Skin Integrity Issues:: Other (Comment) ?Other: ulceration R medial; skin tear L elbow ? ?Last BM:  3/27 (type 4) ? ?Height:  ? ?Ht  Readings from Last 1 Encounters:  ?08/17/21 '5\' 5"'$  (1.651 m)  ? ? ?Weight:  ? ?Wt Readings from Last 1 Encounters:  ?10/01/21 38.6 kg  ? ?BMI:  Body mass index is 14.14 kg/m?. ? ?Estimated Nutritional Needs:  ? ?Kcal:  1200-1400 ? ?Protein:  60-75g ? ?Fluid:  >/=1.5L ? ?Clayborne Dana, RDN, LDN ?Clinical Nutrition ?

## 2021-10-02 NOTE — Progress Notes (Signed)
PT Cancellation Note ? ?Patient Details ?Name: [name] ?MRN: [id number] ?DOB: 15-Nov-1927 ? ? ?Cancelled Treatment:    Reason Eval/Treat Not Completed: Medical issues which prohibited therapy.  PT consult received.  Chart reviewed.  Pt's HR on telemetry in hallway noted to be 88 bpm up to 164 bpm (nurse attending to pt); most recent BP also noted to be elevated to 185/90.  Pt currently does not appear medically appropriate to participate in therapy.  Will hold PT at this time and re-attempt PT evaluation at a later date/time as medically appropriate. ? ?Leitha Bleak, PT ?10/02/21, 9:48 AM ? ?

## 2021-10-02 NOTE — Assessment & Plan Note (Addendum)
-   Continue amlodipine ?

## 2021-10-02 NOTE — Assessment & Plan Note (Signed)
Continue benzos ?

## 2021-10-02 NOTE — Assessment & Plan Note (Addendum)
Noted hemoglobinuria without too many red cells.  Normal CK level.  Could be secondary to prolonged immobilization versus COVID.  Patient received IV fluid which has been discontinued now. ?

## 2021-10-03 ENCOUNTER — Other Ambulatory Visit: Payer: Self-pay

## 2021-10-03 DIAGNOSIS — I5032 Chronic diastolic (congestive) heart failure: Secondary | ICD-10-CM | POA: Diagnosis not present

## 2021-10-03 DIAGNOSIS — E43 Unspecified severe protein-calorie malnutrition: Secondary | ICD-10-CM | POA: Diagnosis present

## 2021-10-03 DIAGNOSIS — J90 Pleural effusion, not elsewhere classified: Secondary | ICD-10-CM | POA: Diagnosis not present

## 2021-10-03 DIAGNOSIS — J9601 Acute respiratory failure with hypoxia: Secondary | ICD-10-CM | POA: Diagnosis not present

## 2021-10-03 DIAGNOSIS — U071 COVID-19: Secondary | ICD-10-CM | POA: Diagnosis not present

## 2021-10-03 LAB — RENAL FUNCTION PANEL
Albumin: 3.5 g/dL (ref 3.5–5.0)
Anion gap: 8 (ref 5–15)
BUN: 18 mg/dL (ref 8–23)
CO2: 23 mmol/L (ref 22–32)
Calcium: 8.3 mg/dL — ABNORMAL LOW (ref 8.9–10.3)
Chloride: 108 mmol/L (ref 98–111)
Creatinine, Ser: 0.82 mg/dL (ref 0.44–1.00)
GFR, Estimated: >60 mL/min (ref >60)
Glucose, Bld: 154 mg/dL — ABNORMAL HIGH (ref 70–99)
Phosphorus: 2.6 mg/dL (ref 2.5–4.6)
Potassium: 4.6 mmol/L (ref 3.5–5.1)
Sodium: 139 mmol/L (ref 135–145)

## 2021-10-03 LAB — TROPONIN I (HIGH SENSITIVITY)
Troponin I (High Sensitivity): 26 ng/L — ABNORMAL HIGH (ref <18)
Troponin I (High Sensitivity): 75 ng/L — ABNORMAL HIGH (ref <18)
Troponin I (High Sensitivity): 25 ng/L — ABNORMAL HIGH (ref <18)
Troponin I (High Sensitivity): 24 ng/L — ABNORMAL HIGH (ref <18)

## 2021-10-03 LAB — CBC
HCT: 39.7 % (ref 36.0–46.0)
Hemoglobin: 12.9 g/dL (ref 12.0–15.0)
MCH: 28.7 pg (ref 26.0–34.0)
MCHC: 32.5 g/dL (ref 30.0–36.0)
MCV: 88.2 fL (ref 80.0–100.0)
Platelets: 251 10*3/uL (ref 150–400)
RBC: 4.5 MIL/uL (ref 3.87–5.11)
RDW: 13.6 % (ref 11.5–15.5)
WBC: 16.9 10*3/uL — ABNORMAL HIGH (ref 4.0–10.5)
nRBC: 0 % (ref 0.0–0.2)

## 2021-10-03 LAB — PATHOLOGIST SMEAR REVIEW

## 2021-10-03 LAB — C-REACTIVE PROTEIN: CRP: 5.2 mg/dL — ABNORMAL HIGH (ref <1.0)

## 2021-10-03 LAB — MAGNESIUM: Magnesium: 1.9 mg/dL (ref 1.7–2.4)

## 2021-10-03 MED ORDER — ENOXAPARIN SODIUM 30 MG/0.3ML IJ SOSY
30.0000 mg | PREFILLED_SYRINGE | INTRAMUSCULAR | Status: DC
  Administered 2021-10-03 – 2021-10-11 (×9): 30 mg via SUBCUTANEOUS
  Filled 2021-10-03 (×9): qty 0.3

## 2021-10-03 MED ORDER — HEPARIN BOLUS VIA INFUSION
2400.0000 [IU] | Freq: Once | INTRAVENOUS | Status: AC
Start: 2021-10-03 — End: 2021-10-03
  Administered 2021-10-03: 2400 [IU] via INTRAVENOUS
  Filled 2021-10-03: qty 2400

## 2021-10-03 MED ORDER — HEPARIN (PORCINE) 25000 UT/250ML-% IV SOLN
500.0000 [IU]/h | INTRAVENOUS | Status: DC
  Administered 2021-10-03: 500 [IU]/h via INTRAVENOUS
  Filled 2021-10-03: qty 250

## 2021-10-03 MED ORDER — HYDRALAZINE HCL 10 MG PO TABS
10.0000 mg | ORAL_TABLET | Freq: Three times a day (TID) | ORAL | Status: DC
  Administered 2021-10-03 – 2021-10-12 (×28): 10 mg via ORAL
  Filled 2021-10-03 (×31): qty 1

## 2021-10-03 NOTE — Progress Notes (Addendum)
[facility] Progress Note ? ?Patient: [name]    EOF:[id number]  DOA: 10/01/2021    ?Date of Service: the patient was seen and examined on 10/03/2021 ? ?Brief hospital course: ?88 year old female with past medical history of severe protein calorie malnutrition, anxiety disorder and chronic pain from postherpetic neuralgia and peripheral arterial disease who goes to the wound care center who reportedly had COVID back in November and brought in on 3/26 for cough and loss of balance.  Patient found to be mildly hypoxic with a large right pleural effusion and COVID-positive.  Patient also found to be hypokalemic as well as hemoglobinuria.  Admitted to the hospitalist service. ? ?On morning of 3/27, patient had episode of persistent SVT requiring 1 dose of IV Lopressor which resolved the issue.  That afternoon, interventional radiology performed right-sided thoracentesis removing 450 cc of fluid.  On early morning of 3/28, patient complaining of some chest pain.  Troponins noted minimal elevation.  Cardiology reviewed case, but felt that given her advanced age and minimal change in troponin, no intervention recommended at this time.  Patient seen by palliative care, and plans are for family meeting. ? ?Assessment and Plan: ?Assessment and Plan: ?* Pleural effusion ?Unclear etiology for cause.?  COVID.  Status post thoracentesis.  Repeat chest x-ray 3/29 looking for any reaccumulation.  Shortness of breath better following thoracentesis.  Weaned off of oxygen. ? ?SVT (supraventricular tachycardia) ([place]) ?Following admission, patient went into episodes of SVT with a heart rate as high as 160s.  Likely set off by hypokalemia and large pleural effusion.  Gave 1 dose of IV Lopressor which seem to have resolved situation.  IV Lopressor made as needed. ? ?COVID-19 virus infection ?Patient reportedly has had symptoms for 2 weeks.  Also reportedly had COVID 4 months ago.  Will check CRP level to get an idea of  inflammatory state. ? ?Hypokalemia ?Not much improvement despite aggressive replacement yesterday.  Checking magnesium level.  Continue to replace. ? ?Protein-calorie malnutrition, severe ?BMI only 14.  High mortality risk. ?Nutrition Status: ?Nutrition Problem: Increased nutrient needs ?Etiology: acute illness ?Signs/Symptoms: meal completion < 25% ?Interventions: Ensure Enlive (each supplement provides 350kcal and 20 grams of protein), MVI, Magic cup ? ?Palliative care consulted. ? ?Chronic diastolic CHF (congestive heart failure) ([place]) ?Grade 1 diastolic dysfunction seen on echocardiogram in December 2021.  Elevated BNP.  We will try some IV Lasix although with her poor albumin, she will likely have some third spacing.  In addition, some of her elevated BNP may be due to pleural effusion.  This may account for patient's persistently elevated blood pressure. ? ?Hemoglobinuria ?Noted hemoglobinuria without too many red cells.  Normal CK level.  Could be secondary to prolonged immobilization versus COVID.  Will treat with fluids. ? ?PAD (peripheral artery disease) ([place]) ?Continue wound care for vascular wounds.  Continue home pain regimen. ? ?Hypertension ?Not on any blood pressure medications at home.  Blood pressure elevated here.  Started on hydralazine p.o.  Improved somewhat.  We will also try some Lasix. ? ?Anxiety disorder ?Continue benzos ? ?Narcotic dependence ([place]) ?Patient on chronic MSIR for her postherpetic neuralgia and arterial disease wounds.  Continue. ? ?Postherpetic neuralgia ?Continue Topamax and MSIR ? ? ? ? ? ? ?Body mass index is 14.14 kg/m?[name]  ?Nutrition Problem: Severe Malnutrition ?Etiology: chronic illness (COVID+, PAD with chronic vascular ulcers, advanced age) ?Pressure Injury Ankle Anterior;Right (Active)  ?   ?Location: Ankle  ?Location Orientation: Anterior;Right  ?Staging:   ?  Wound Description (Comments):   ?Present on Admission:   ?  ? ?Consultants: ?Interventional  radiology ?Palliative care ? ?Procedures: ?Status post thoracentesis of right lung done 3/27: 450 cc of fluid removed. ? ?Antimicrobials: ?None ? ?Code Status: Full code ? ? ?Subjective: Patient breathing a little easier today.  Feels very tired. ? ?Objective: ?Noted elevated blood pressures ?Vitals:  ? 10/03/21 1133 10/03/21 1641  ?BP: (!) 140/56 (!) 142/58  ?Pulse: 93 92  ?Resp: 16 17  ?Temp: 98.7 ?F (37.1 ?C) 99.4 ?F (37.4 ?C)  ?SpO2: 96% 93%  ? ? ?Intake/Output Summary (Last 24 hours) at 10/03/2021 1841 ?Last data filed at 10/03/2021 0410 ?Gross per 24 hour  ?Intake --  ?Output 700 ml  ?Net -700 ml  ? ?Filed Weights  ? 10/01/21 1029  ?Weight: 38.6 kg  ? ?Body mass index is 14.14 kg/m?. ? ?Exam: ? ?General: Oriented x2, mild distress secondary to pain ?HEENT: Emaciated, frontal bossing.  Mucous membranes are dry ?Cardiovascular: Regular rate and rhythm, borderline tachycardia ?Respiratory: Poor inspiratory effort.  Better breath sounds on right side. ?Abdomen: Soft, nontender, nondistended, hypoactive bowel sounds ?Musculoskeletal: No clubbing or cyanosis or edema ?Skin: Noted peripheral ulcers ?Psychiatry: Appropriate, no evidence of psychoses ?Neurology: No focal deficits ? ?Data Reviewed: ?Noted increase in white blood cell count.  CRP only at 5.2. ? ?Disposition:  ?Status is: Inpatient ?Remains inpatient appropriate because: Further medical stabilization, need for thoracentesis, determination of goals of care ?  ? ?Anticipated discharge date: 3/31 ? ?Remaining issues to be resolved so that patient can be discharged: Goals of care ? ? ?Family Communication: Updated son by phone ?DVT Prophylaxis: ?  Resume Lovenox once heparin infusion discontinued.  Patient on heparin for chest pain earlier. ? ? ? ?Author: ?Annita Brod ,MD ?10/03/2021 6:41 PM ? ?To reach On-call, see care teams to locate the attending and reach out via [email or web address]. ?Between 7PM-7AM, please contact night-coverage ?If you still have  difficulty reaching the attending provider, please page the [facility] (Director on Call) for [facility] on amion for assistance. ? ?

## 2021-10-03 NOTE — Assessment & Plan Note (Addendum)
BMI only 16.  High mortality risk. ?Nutrition Status: ?Nutrition Problem: Increased nutrient needs ?Etiology: acute illness ?Signs/Symptoms: meal completion < 25% ?Interventions: Ensure Enlive (each supplement provides 350kcal and 20 grams of protein), MVI, Magic cup ? ?Palliative care consulted. ?Plan is to proceed with hospice care if she declines and discharged to SNF if remains stable, currently stable. ?

## 2021-10-03 NOTE — Assessment & Plan Note (Addendum)
Patient reportedly has had symptoms for 2 weeks.  Also reportedly had COVID 4 months ago.  CRP checked on 3/28 was elevated at 5.2. ?CT value of positive COVID is 42.8 which makes it a prior infection. ?-No need for isolation anymore. ?-TOC to find out from facility if they can take her before, actual proposed date of discharge is 10/12/2021 ? ?

## 2021-10-03 NOTE — Progress Notes (Signed)
PHARMACIST - PHYSICIAN COMMUNICATION ? ?CONCERNING:  Enoxaparin (Lovenox) for DVT Prophylaxis  ? ? ?RECOMMENDATION: ? ?Filed Weights  ? 10/01/21 1029  ?Weight: 38.6 kg (85 lb)  ? ? ?Body mass index is 14.14 kg/m?. ? ?Estimated Creatinine Clearance: 26.7 mL/min (by C-G formula based on SCr of 0.82 mg/dL). ? ? ?Patient is candidate for enoxaparin '30mg'$  every 24 hours based on CrCl <62m/min or Weight <45kg ? ?DESCRIPTION: ?[facility] ? ?Patient is now receiving enoxaparin 30 mg every 24 hours  ? ?CWynelle Cleveland PharmD ?Pharmacy Resident  ?10/03/2021 ?6:40 PM ? ? ?

## 2021-10-03 NOTE — Progress Notes (Signed)
ANTICOAGULATION CONSULT NOTE - Initial Consult ? ?Pharmacy Consult for Heparin  ?Indication: chest pain/ACS ? ?Allergies  ?Allergen Reactions  ? Duloxetine   ?  REACTION: N/V ?Mental status change and trouble with balance  ? Cymbalta [Duloxetine Hcl]   ?  Unsure of reaction  ? Maxitrol [Neomycin-Polymyxin-Dexameth]   ?  Unsure of reaction  ? Pregabalin   ?  REACTION: SOB, swollen lips  ? Tobradex [Tobramycin-Dexamethasone]   ?  Unsure of reaction  ? ? ?Patient Measurements: ?Weight: 38.6 kg (85 lb) ?Heparin Dosing Weight:  61.46 kg (will use 38.6 kg due to low TBW) ? ?Vital Signs: ?Temp: 98.5 ?F (36.9 ?C) (03/28 0408) ?Temp Source: Oral (03/28 0408) ?BP: 148/81 (03/28 0408) ?Pulse Rate: 96 (03/28 0408) ? ?Labs: ?Recent Labs  ?  10/01/21 ?1040 10/01/21 ?1250 10/02/21 ?0627 10/03/21 ?0321 10/03/21 ?4562  ?HGB 11.8*  --  11.9* 12.9  --   ?HCT 37.6  --  36.3 39.7  --   ?PLT 191  --  216 251  --   ?CREATININE  --  0.64 0.62 0.82  --   ?CKTOTAL  --   --  62  --   --   ?TROPONINIHS  --   --   --  26* 75*  ? ? ?Estimated Creatinine Clearance: 26.7 mL/min (by C-G formula based on SCr of 0.82 mg/dL). ? ? ?Medical History: ?Past Medical History:  ?Diagnosis Date  ? Arrhythmia   ? Arthritis   ? Atypical chest pain   ? Lexiscan myoview (5/11) with EF 84%, normal wall motion, small fixed apical  perfusion defect likely due to breast attenuation, no evidence for ischemia or infarction.  **Patient had an  ? Basal cell carcinoma 07/28/19 ED[facility]  ? R pretibial  ? Breast cancer ([place])   ? Bilateral mastectomies 1986.  ? CVA (cerebral infarction) 10/12  ? right lacunar  ? Depression   ? Dyspnea   ? Echo (5/11) was a difficult study due to breast implants but showed normal LV and RV size and systolic function.      ? GERD (gastroesophageal reflux disease)   ? Headache   ? s/p shingles - right side of head  ? History of partial thyroidectomy   ? Hyperlipidemia   ? Hypertension   ? in past - no current meds/issues  ? Obstruction of intestine  ([facility])   ? Partial small bowel obstruction ([place]) 7/14  ? no surgery  ? Superficial basal cell carcinoma 06/18/2019  ? R mid to distal pretibial  ? Wears dentures   ? full upper  ? Zoster   ?  with Post-herpetic neuralgia  ? ? ?Medications:  ?Medications Prior to Admission  ?Medication Sig Dispense Refill Last Dose  ? acyclovir (ZOVIRAX) 400 MG tablet Take 1 tablet (400 mg total) by mouth 3 (three) times daily. 90 tablet 11 09/30/2021  ? azelastine (ASTELIN) 0.1 % nasal spray Place 2 sprays into both nostrils 2 (two) times daily.   09/30/2021  ? fexofenadine (ALLEGRA) 180 MG tablet Take 180 mg by mouth daily.   09/30/2021  ? LORazepam (ATIVAN) 1 MG tablet TAKE 1/2 TO 1 TABLET BY MOUTH TWICE DAILY AS NEEDED FOR ANXIETY (Patient taking differently: Take 1 mg by mouth at bedtime. TAKE 1/2 TO 1 TABLET BY MOUTH TWICE DAILY AS NEEDED FOR ANXIETY) 60 tablet 0 09/30/2021  ? morphine (MSIR) 15 MG tablet TAKE 1 TABLET BY MOUTH 3 TIMES DAILY AS NEEDED FOR PAIN 90 tablet 0 09/30/2021  ?  Multiple Vitamin (MULTIVITAMIN) tablet Take 1 tablet by mouth once daily   Past Month  ? pantoprazole (PROTONIX) 40 MG tablet TAKE 1 TABLET BY MOUTH TWICE A DAY 180 tablet 3 09/30/2021  ? polyethylene glycol (MIRALAX / GLYCOLAX) packet Take 17 g by mouth at bedtime.   Past Month  ? RESTASIS 0.05 % ophthalmic emulsion Place 1 drop into both eyes 2 (two) times daily.   09/30/2021  ? topiramate (TOPAMAX) 50 MG tablet Take 1 tablet (50 mg total) by mouth at bedtime. 30 tablet 11 09/30/2021  ? ? ?Assessment: ?Pharmacy consulted to dose heparin in this 88 year old female with ACS/NSTEMI.   Pt received lovenox 30 mg SQ on 3/27 @ 2128 for DVT prophylaxis.  ?CrCl = 26.7 ml/min.   ?No anticoag PTA noted.  ? ?Goal of Therapy:  ?Heparin level 0.3-0.7 units/ml ?Monitor platelets by anticoagulation protocol: Yes ?  ?Plan:  ?Give 2400 units bolus x 1 ?Start heparin infusion at 500 units/hr ?Check anti-Xa level in 8 hours and daily while on heparin ?Continue to monitor  H&H and platelets ? ?Madysin Crisp D ?10/03/2021,6:41 AM ? ? ?

## 2021-10-03 NOTE — Consult Note (Signed)
PHARMACY CONSULT NOTE - FOLLOW UP ? ?Pharmacy Consult for Electrolyte Monitoring and Replacement  ? ?Recent Labs: ?Potassium (mmol/L)  ?Date Value  ?10/03/2021 4.6  ?01/22/2013 3.8  ? ?Magnesium (mg/dL)  ?Date Value  ?10/03/2021 1.9  ? ?Calcium (mg/dL)  ?Date Value  ?10/03/2021 8.3 (L)  ? ?Calcium, Total (mg/dL)  ?Date Value  ?01/19/2013 8.3 (L)  ? ?Albumin (g/dL)  ?Date Value  ?10/03/2021 3.5  ?01/17/2013 3.9  ? ?Phosphorus (mg/dL)  ?Date Value  ?10/03/2021 2.6  ? ?Sodium (mmol/L)  ?Date Value  ?10/03/2021 139  ?01/27/2018 141  ?01/19/2013 142  ? ?Corrected Calcium: 8.7 mg/dL ? ? ?Assessment: ?88 y.o. female with medical history significant of arthritis, breast cancer, h/o BCC of the skin, partial thyroidectomy, GERD, HLD, h/o CVA, PAD with vascular ulcers on the left and right ankles, and recent h/o postherpetic neuralgia affecting head and right eye who presented with cough and balance issues. Pharmacy has been consulted for electrolyte management.  ? ?Goal of Therapy:  ?Electrolytes WNL  ? ?Plan:  ?Electrolytes are within normal limits. No repletion warranted at this time ?F/u electrolytes with AM labs  ? ? ?Thank you for allowing pharmacy to be a part of this patient?s care. ? ?Darrick Penna, PharmD, MS PGPM ?Clinical Pharmacist ?10/03/2021 ?8:55 AM ? ? ?

## 2021-10-03 NOTE — TOC Initial Note (Signed)
Transition of Care (TOC) - Initial/Assessment Note  ? ? ?Patient Details  ?Name: [name] ?MRN: [id number] ?Date of Birth: May 21, 1928 ? ?Transition of Care (TOC) CM/SW Contact:    ?Pete Pelt, RN ?Phone Number: ?10/03/2021, 11:18 AM ? ?Clinical Narrative:      Patient was receiving care and is only alert and oriented x 2.  RNCM spoke with family, who states that patient lives alone and they accept SNF recommendation.  SNF bed search started, will communicate bed offers to family when received.  TOC to follow.          ? ? ?Expected Discharge Plan: [place] ?Barriers to Discharge: Continued Medical Work up ? ? ?Patient Goals and CMS Choice ?  ?  ?Choice offered to / list presented to : NA ? ?Expected Discharge Plan and Services ?Expected Discharge Plan: [place] ?  ?Discharge Planning Services: CM Consult ?Post Acute Care Choice: [place] ?Living arrangements for the past 2 months: Apartment ?                ?  ?  ?  ?  ?  ?  ?  ?  ?  ?  ? ?Prior Living Arrangements/Services ?Living arrangements for the past 2 months: Apartment ?Lives with:: Self ?Patient language and need for interpreter reviewed::  (Patient receiving care;  spoke to family) ?Do you feel safe going back to the place where you live?: No   Patient requires rehabilitiation prior to discharging home  ?Need for Family Participation in Patient Care: Yes (Comment) ?Care giver support system in place?: Yes (comment) ?  ?Criminal Activity/Legal Involvement Pertinent to Current Situation/Hospitalization: No - Comment as needed ? ?Activities of Daily Living ?Home Assistive Devices/Equipment: [name] (specify type), Eyeglasses ?ADL Screening (condition at time of admission) ?Patient's cognitive ability adequate to safely complete daily activities?: Yes ?Is the patient deaf or have difficulty hearing?: No ?Does the patient have difficulty seeing, even when wearing glasses/contacts?: Yes ?Does the patient have  difficulty concentrating, remembering, or making decisions?: No ?Patient able to express need for assistance with ADLs?: Yes ?Does the patient have difficulty dressing or bathing?: Yes ?Independently performs ADLs?: No ?Communication: Needs assistance ?Dressing (OT): Needs assistance ?Grooming: Needs assistance ?Bathing: Independent ?In/Out Bed: Needs assistance ?Walks in Home: Needs assistance ?Is this a change from baseline?: Pre-admission baseline ?Weakness of Legs: Both ?Weakness of Arms/Hands: Both ? ?Permission Sought/Granted ?Permission sought to share information with : Case Freight forwarder, Customer service manager ?Permission granted to share information with : Yes, Verbal Permission Granted ?   ?   ?   ?   ? ?Emotional Assessment ?Appearance:: Appears stated age ?Attitude/Demeanor/Rapport:  (spoke with family due to patients cognitive status and patient receiving care) ?  ?Orientation: : Oriented to Self, Oriented to Place ?Alcohol / Substance Use: Not Applicable ?Psych Involvement: No (comment) ? ?Admission diagnosis:  Hypokalemia [E87.6] ?Pleural effusion [J90] ?Generalized weakness [R53.1] ?COVID-19 [U07.1] ?Patient Active Problem List  ? Diagnosis Date Noted  ? Acute respiratory failure with hypoxia ([place]) 10/02/2021  ? COVID-19 virus infection 10/02/2021  ? Anxiety disorder 10/02/2021  ? Hemoglobinuria 10/02/2021  ? Hypokalemia 10/02/2021  ? Chronic diastolic CHF (congestive heart failure) ([place]) 10/02/2021  ? SVT (supraventricular tachycardia) ([place]) 10/02/2021  ? Pleural effusion 10/01/2021  ? Benzodiazepine dependence ([place]) 04/17/2018  ? PAD (peripheral artery disease) ([place]) 07/22/2017  ? Narcotic dependence ([place]) 01/03/2017  ? 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 Postherpetic neuralgia 12/15/2015  ?  Advance directive discussed with patient 10/27/2015  ? Allergic rhinitis due to pollen 05/20/2015  ? Routine general medical examination at a health care facility 02/22/2014  ?  Protein-calorie malnutrition ([place]) 03/24/2012  ? Hypertension   ? B12 deficiency 05/03/2009  ? Neuropathy ([place]) 04/04/2009  ? Episodic mood disorder ([place]) 06/16/2008  ? GLAUCOMA NOS 11/14/2006  ? SLEEP DISORDER 11/14/2006  ? HYPERLIPIDEMIA 08/28/2006  ? GERD 08/28/2006  ? OSTEOARTHRITIS 08/28/2006  ? BREAST CANCER, HX OF 08/28/2006  ? ?PCP:  Venia Carbon, MD ?Pharmacy:   ?[facility] [address] ?[place] [address] ?Phone: [phone] Fax: [phone] ? ? ? ? ?Social Determinants of Health (SDOH) Interventions ?  ? ?Readmission Risk Interventions ?   ? View : No data to display.  ?  ?  ?  ? ? ? ?

## 2021-10-03 NOTE — Progress Notes (Signed)
Initial Nutrition Assessment ? ?DOCUMENTATION CODES:  ? ?Underweight, Severe malnutrition in context of chronic illness ? ?INTERVENTION:  ?- Encourage PO intake ?  ?- Ensure Enlive po BID, each supplement provides 350 kcal and 20 grams of protein. ?  ?- Magic cup TID with meals, each supplement provides 290 kcal and 9 grams of protein ?  ?- MVI with minerals daily ? ?NUTRITION DIAGNOSIS:  ? ?Severe Malnutrition related to chronic illness (COVID+, PAD with chronic vascular ulcers, advanced age) as evidenced by energy intake < or equal to 75% for > or equal to 1 month, severe fat depletion, severe muscle depletion. ? ?Ongoing ? ?GOAL:  ? ?Patient will meet greater than or equal to 90% of their needs ? ?Not being met- addressing needs via meals and nutrition supplements ? ?MONITOR:  ? ?PO intake, Supplement acceptance, Labs, Weight trends, Skin ? ?REASON FOR ASSESSMENT:  ? ?Consult ?Assessment of nutrition requirement/status ? ?ASSESSMENT:  ? ?Pt admitted with cough x 2 weeks and loss of balance secondary to pleural effusion. Noted to be hypoxic on room air and COVID+. PMH significant for arthritis, breast CA, BCC of the skin, partial thyroidectomy, GERD, HLD, CVA, PAD with vascular ulcers on L/R ankles and recent h/o postherpetic neuralgia affecting head and R eye. ? ?3/27- s/p thoracentesis - pending results ? ?Palliative following. Pending GOC discussion. ? ?Pt resting in bed with family at bedside. They report that pt is eating at her baseline. She typically eats very little. This morning she had some eggs for breakfast and had some ice cream. Pt had unopened Ensure on table from yesterday. Pt was requesting something to drink. Provided Cola with ice and a cold Ensure. ? ?Pt's family states that at her last appointment she weighed ~83 lbs. They deny recent weight loss. Reviewed weight history. Weights appear stable. Current weight noted to be 85 lbs. ? ?Medications: vitamin C, MVI, protonix, miralax, KLOR-CON,  zinc ? ?Labs: calcium 8.3 ? ?NUTRITION - FOCUSED PHYSICAL EXAM: ? ?Flowsheet Row Most Recent Value  ?Orbital Region Severe depletion  ?Upper Arm Region Severe depletion  ?Thoracic and Lumbar Region Severe depletion  ?Buccal Region Severe depletion  ?Temple Region Severe depletion  ?Clavicle Bone Region Severe depletion  ?Clavicle and Acromion Bone Region Severe depletion  ?Scapular Bone Region Severe depletion  ?Dorsal Hand Severe depletion  ?Patellar Region Severe depletion  ?Anterior Thigh Region Severe depletion  ?Posterior Calf Region Severe depletion  ?Edema (RD Assessment) None  ?Hair Reviewed  ?Eyes Reviewed  ?Mouth Other (Comment)  [poor dentition]  ?Skin Reviewed  ?Nails Reviewed  ? ?  ? ?Diet Order:   ?Diet Order   ? ?       ?  DIET DYS 3 Room service appropriate? Yes; Fluid consistency: Thin  Diet effective now       ?  ? ?  ?  ? ?  ? ? ?EDUCATION NEEDS:  ? ?No education needs have been identified at this time ? ?Skin:  Skin Assessment: Skin Integrity Issues: ?Skin Integrity Issues:: Other (Comment) ?Other: ulceration R medial; skin tear L elbow ? ?Last BM:  3/27 (type 4) ? ?Height:  ? ?Ht Readings from Last 1 Encounters:  ?08/17/21 '5\' 5"'  (1.651 m)  ? ? ?Weight:  ? ?Wt Readings from Last 1 Encounters:  ?10/01/21 38.6 kg  ? ?BMI:  Body mass index is 14.14 kg/m?. ? ?Estimated Nutritional Needs:  ? ?Kcal:  1200-1400 ? ?Protein:  60-75g ? ?Fluid:  >/=1.5L ? ?Clayborne Dana, RDN,  LDN ?Clinical Nutrition ?

## 2021-10-03 NOTE — Plan of Care (Signed)
PMT note: ? ?Patient is tired and frail. She states she lives alone, and that she has 2 children. She then tells me she does not feel like talking, and to talk with her children. Son and DIL are listed as contacts. Called son unsuccessfully. Will follow up tomorrow.  ?

## 2021-10-03 NOTE — Progress Notes (Addendum)
? ?      CROSS COVER NOTE ? ?NAME: [name] ?MRN: [id number] ?DOB : 03-11-1928 ? ? ?Secure chat received from nursing reporting chest pain. On my bedside evaluation patient is reporting 6/10 throbbing back pain that does not radiate and is worse with movement. She denies nausea, vomiting, dyspnea, or chest pain at this time. Equal radial, femoral, and pedal pulses bilaterally. No focal neurologic deficits noted. On chart review patient does have a history of atypical chest pain. ? ?Plan: ? ?- EKG ?- Trop 26--> 75, will continue to trend ?- Heparin per pharmacy ?- [facility] consulted, patient saw group outpatient in the past.  ? ?Neomia Glass MHA, MSN, FNP-BC ?Nurse Practitioner ?[facility] ?[place] ?Pager [phone] ? ? ?

## 2021-10-03 NOTE — [place] FL2 (Signed)
?[place] MEDICAID FL2 LEVEL OF CARE SCREENING TOOL  ?  ? ?IDENTIFICATION  ?Patient Name: ?[name] Birthdate: August 29, 1927 Sex: female Admission Date (Current Location): ?10/01/2021  ?[place] and [place] Number: ? [place] ?  Facility and Address:  ?[facility], [address], [place], [place] [address] ?     Provider Number: ?[id number]  ?Attending Physician Name and Address:  ?Annita Brod, MD ? Relative Name and Phone Number:  ?Alaiyah, Bollman (Son)   [phone] [facility]) ?   ?Current Level of Care: ?Hospital Recommended Level of Care: ?[place] Prior Approval Number: ?  ? ?Date Approved/Denied: ?  PASRR Number: ?[id number] A ? ?Discharge Plan: ?SNF ?  ? ?Current Diagnoses: ?Patient Active Problem List  ? Diagnosis Date Noted  ? Acute respiratory failure with hypoxia ([place]) 10/02/2021  ? COVID-19 virus infection 10/02/2021  ? Anxiety disorder 10/02/2021  ? Hemoglobinuria 10/02/2021  ? Hypokalemia 10/02/2021  ? Chronic diastolic CHF (congestive heart failure) ([place]) 10/02/2021  ? SVT (supraventricular tachycardia) ([place]) 10/02/2021  ? Pleural effusion 10/01/2021  ? Benzodiazepine dependence ([place]) 04/17/2018  ? PAD (peripheral artery disease) ([place]) 07/22/2017  ? Narcotic dependence ([place]) 01/03/2017  ? 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 Postherpetic neuralgia 12/15/2015  ? Advance directive discussed with patient 10/27/2015  ? Allergic rhinitis due to pollen 05/20/2015  ? Routine general medical examination at a health care facility 02/22/2014  ? Protein-calorie malnutrition ([place]) 03/24/2012  ? Hypertension   ? B12 deficiency 05/03/2009  ? Neuropathy ([place]) 04/04/2009  ? Episodic mood disorder ([place]) 06/16/2008  ? GLAUCOMA NOS 11/14/2006  ? SLEEP DISORDER 11/14/2006  ? HYPERLIPIDEMIA 08/28/2006  ? GERD 08/28/2006  ? OSTEOARTHRITIS 08/28/2006  ? BREAST CANCER, HX OF 08/28/2006  ? ? ?Orientation RESPIRATION BLADDER Height & Weight   ?  ?Self, Situation ?  Normal Incontinent Weight: 38.6 kg ?Height:     ?BEHAVIORAL SYMPTOMS/MOOD NEUROLOGICAL BOWEL NUTRITION STATUS  ?    Continent Diet (DYS 3)  ?AMBULATORY STATUS COMMUNICATION OF NEEDS Skin   ?Extensive Assist Verbally Other (Comment) (skin tear L elbow foam dressing) ?  ?  ?  ?    ?     ?     ? ? ?Personal Care Assistance Level of Assistance  ?Bathing, Feeding, Dressing Bathing Assistance: Maximum assistance (Max assist for toilenting and pericare;) ?Feeding assistance: Limited assistance ?Dressing Assistance: Maximum assistance ?   ? ?Functional Limitations Info  ?Sight, Hearing, Speech Sight Info: Impaired (Blind) ?Hearing Info: Adequate ?Speech Info: Impaired (Missing teeth)  ? ? ?SPECIAL CARE FACTORS FREQUENCY  ?PT (By licensed PT), OT (By licensed OT)   ?  ?PT Frequency: Min 5x weekly ?OT Frequency: Min 5x weekly ?  ?  ?  ?   ? ? ?Contractures Contractures Info: Not present  ? ? ?Additional Factors Info  ?Code Status, Allergies Code Status Info: FULL CODE ?Allergies Info: Duloxetine  Cymbalta ( Maxitrol (neomycin-polymyxin-dexameth)   Pregabalin   Tobradex (tobramycin-dexamethasone) ?  ?  ?  ?   ? ?Current Medications (10/03/2021):  This is the current hospital active medication list ?Current Facility-Administered Medications  ?Medication Dose Route Frequency Provider Last Rate Last Admin  ? acetaminophen (TYLENOL) tablet 650 mg  650 mg Oral Q6H PRN Foust, Katy L, NP   650 mg at 10/02/21 2236  ? acyclovir (ZOVIRAX) 200 MG capsule 400 mg  400 mg Oral TID Darrick Penna, RPH   400 mg at 10/03/21 0912  ? adenosine (ADENOCARD) 6 MG/2ML injection  3 mg  3 mg Intravenous Once Annita Brod, MD      ? amLODipine (NORVASC) tablet 2.5 mg  2.5 mg Oral Daily Gilles Chiquito B, MD   2.5 mg at 10/03/21 2707  ? ascorbic acid (VITAMIN C) tablet 500 mg  500 mg Oral Daily Gilles Chiquito B, MD   500 mg at 10/03/21 8675  ? azelastine (ASTELIN) 0.1 % nasal spray 2 spray  2 spray Each Nare BID PRN Sid Falcon, MD      ?  chlorpheniramine-HYDROcodone 10-8 MG/5ML suspension 5 mL  5 mL Oral Q12H PRN Sid Falcon, MD      ? cycloSPORINE (RESTASIS) 0.05 % ophthalmic emulsion 1 drop  1 drop Both Eyes BID Sid Falcon, MD   1 drop at 10/03/21 0910  ? feeding supplement (ENSURE ENLIVE / ENSURE PLUS) liquid 237 mL  237 mL Oral BID BM Annita Brod, MD   237 mL at 10/03/21 0913  ? guaiFENesin-dextromethorphan (ROBITUSSIN DM) 100-10 MG/5ML syrup 10 mL  10 mL Oral Q4H PRN Gilles Chiquito B, MD      ? heparin ADULT infusion 100 units/mL (25000 units/268m)  500 Units/hr Intravenous Continuous KAnnita Brod MD 5 mL/hr at 10/03/21 0703 500 Units/hr at 10/03/21 0703  ? hydrALAZINE (APRESOLINE) tablet 10 mg  10 mg Oral Q8H KAnnita Brod MD   10 mg at 10/03/21 0912  ? loratadine (CLARITIN) tablet 10 mg  10 mg Oral Daily MSid Falcon MD   10 mg at 10/03/21 0912  ? LORazepam (ATIVAN) tablet 1 mg  1 mg Oral QHS MGilles ChiquitoB, MD   1 mg at 10/02/21 2128  ? metoprolol tartrate (LOPRESSOR) injection 5 mg  5 mg Intravenous Q6H PRN KAnnita Brod MD      ? morphine (MSIR) tablet 15 mg  15 mg Oral TID PRN MSid Falcon MD   15 mg at 10/03/21 0444  ? multivitamin with minerals tablet 1 tablet  1 tablet Oral Daily KAnnita Brod MD   1 tablet at 10/03/21 0911  ? pantoprazole (PROTONIX) EC tablet 40 mg  40 mg Oral BID MSid Falcon MD   40 mg at 10/03/21 0913  ? polyethylene glycol (MIRALAX / GLYCOLAX) packet 17 g  17 g Oral QHS MSid Falcon MD   17 g at 10/02/21 2128  ? potassium chloride SA (KLOR-CON M) CR tablet 40 mEq  40 mEq Oral BID KAnnita Brod MD   40 mEq at 10/03/21 0911  ? promethazine (PHENERGAN) tablet 12.5 mg  12.5 mg Oral Q6H PRN MGilles ChiquitoB, MD      ? sodium chloride flush (NS) 0.9 % injection 3 mL  3 mL Intravenous Q12H MSid Falcon MD   3 mL at 10/03/21 0913  ? topiramate (TOPAMAX) tablet 50 mg  50 mg Oral QHS MSid Falcon MD   50 mg at 10/02/21 2128  ? zinc sulfate capsule 220 mg   220 mg Oral Daily MSid Falcon MD   220 mg at 10/03/21 04492 ? ? ? ?Discharge Medications: ?Please see discharge summary for a list of discharge medications. ? ?Relevant Imaging Results: ? ?Relevant Lab Results: ? ? ?Additional Information ?ss 2010 07 1219? ?EPete Pelt RN ? ? ? ? ?

## 2021-10-04 ENCOUNTER — Inpatient Hospital Stay: Payer: PPO

## 2021-10-04 DIAGNOSIS — Z7189 Other specified counseling: Secondary | ICD-10-CM

## 2021-10-04 DIAGNOSIS — J90 Pleural effusion, not elsewhere classified: Secondary | ICD-10-CM | POA: Diagnosis not present

## 2021-10-04 LAB — CBC
HCT: 37.3 % (ref 36.0–46.0)
Hemoglobin: 11.8 g/dL — ABNORMAL LOW (ref 12.0–15.0)
MCH: 28.3 pg (ref 26.0–34.0)
MCHC: 31.6 g/dL (ref 30.0–36.0)
MCV: 89.4 fL (ref 80.0–100.0)
Platelets: 206 10*3/uL (ref 150–400)
RBC: 4.17 MIL/uL (ref 3.87–5.11)
RDW: 14.1 % (ref 11.5–15.5)
WBC: 12.8 10*3/uL — ABNORMAL HIGH (ref 4.0–10.5)
nRBC: 0 % (ref 0.0–0.2)

## 2021-10-04 LAB — BASIC METABOLIC PANEL
Anion gap: 5 (ref 5–15)
Anion gap: 6 (ref 5–15)
BUN: 26 mg/dL — ABNORMAL HIGH (ref 8–23)
BUN: 30 mg/dL — ABNORMAL HIGH (ref 8–23)
CO2: 24 mmol/L (ref 22–32)
CO2: 24 mmol/L (ref 22–32)
Calcium: 8.6 mg/dL — ABNORMAL LOW (ref 8.9–10.3)
Calcium: 8.5 mg/dL — ABNORMAL LOW (ref 8.9–10.3)
Chloride: 106 mmol/L (ref 98–111)
Chloride: 108 mmol/L (ref 98–111)
Creatinine, Ser: 0.82 mg/dL (ref 0.44–1.00)
Creatinine, Ser: 0.85 mg/dL (ref 0.44–1.00)
GFR, Estimated: >60 mL/min (ref >60)
GFR, Estimated: >60 mL/min (ref >60)
Glucose, Bld: 116 mg/dL — ABNORMAL HIGH (ref 70–99)
Glucose, Bld: 174 mg/dL — ABNORMAL HIGH (ref 70–99)
Potassium: 5.4 mmol/L — ABNORMAL HIGH (ref 3.5–5.1)
Potassium: 4.9 mmol/L (ref 3.5–5.1)
Sodium: 135 mmol/L (ref 135–145)
Sodium: 138 mmol/L (ref 135–145)

## 2021-10-04 LAB — MAGNESIUM: Magnesium: 2 mg/dL (ref 1.7–2.4)

## 2021-10-04 LAB — PROCALCITONIN: Procalcitonin: 0.25 ng/mL

## 2021-10-04 LAB — PHOSPHORUS: Phosphorus: 2 mg/dL — ABNORMAL LOW (ref 2.5–4.6)

## 2021-10-04 LAB — ALBUMIN: Albumin: 3.1 g/dL — ABNORMAL LOW (ref 3.5–5.0)

## 2021-10-04 MED ORDER — K PHOS MONO-SOD PHOS DI & MONO 155-852-130 MG PO TABS
500.0000 mg | ORAL_TABLET | ORAL | Status: AC
  Administered 2021-10-04 (×4): 500 mg via ORAL
  Filled 2021-10-04 (×4): qty 2

## 2021-10-04 MED ORDER — AMLODIPINE BESYLATE 5 MG PO TABS
5.0000 mg | ORAL_TABLET | Freq: Every day | ORAL | Status: DC
  Administered 2021-10-04 – 2021-10-11 (×8): 5 mg via ORAL
  Filled 2021-10-04 (×7): qty 1

## 2021-10-04 NOTE — Progress Notes (Addendum)
[facility] Progress Note ? ?Patient: [name]    YQI:[id number]  DOA: 10/01/2021    ?Date of Service: the patient was seen and examined on 10/04/2021 ? ?Brief hospital course: ?88 year old female with past medical history of severe protein calorie malnutrition, anxiety disorder and chronic pain from postherpetic neuralgia and peripheral arterial disease who goes to the wound care center who reportedly had COVID back in November and brought in on 3/26 for cough and loss of balance.  Patient found to be mildly hypoxic with a large right pleural effusion and COVID-positive.  Patient also found to be hypokalemic as well as hemoglobinuria.  Admitted to the hospitalist service. ?  ?On morning of 3/27, patient had episode of persistent SVT requiring 1 dose of IV Lopressor which resolved the issue.  That afternoon, interventional radiology performed right-sided thoracentesis removing 450 cc of fluid.  On early morning of 3/28, patient complaining of some chest pain.  Troponins noted minimal elevation.  Cardiology reviewed case, but felt that given her advanced age and minimal change in troponin, no intervention recommended at this time.  Patient seen by palliative care, and plans are for family meeting. ? ?Assessment and Plan: ?Assessment and Plan: ?* Pleural effusion ?Unclear etiology for cause.?  COVID.  Status post thoracentesis 3/27. Exudative by protein (LDH not obtained). Culture negative. Clinically improved, weaned off o2. Will repeat CXR today to assess. If recurs would check cytology ? ?SVT (supraventricular tachycardia) ([place]) ?Following admission, patient went into episodes of SVT with a heart rate as high as 160s.  Likely set off by hypokalemia and large pleural effusion.  Gave 1 dose of IV Lopressor which seem to have resolved situation.  IV Lopressor made as needed. ? ?COVID-19 virus infection ?Patient reportedly has had symptoms for 2 weeks.  Also reportedly had COVID 4 months ago.  Crp remains  elevated. ? ?Hyperkalemia ?K 5.4 today, iatrogenic 2/2 repletion for hypokalemia. Potassium held, kidney function normal, will repeat k this PM ? ?Protein-calorie malnutrition, severe ?BMI only 14.  High mortality risk. ? ?Nutrition Status: ?Nutrition Problem: Increased nutrient needs ?Etiology: acute illness ?Signs/Symptoms: meal completion < 25% ?Interventions: Ensure Enlive (each supplement provides 350kcal and 20 grams of protein), MVI, Magic cup ? ?Debility ?Hospice eligible. Plan for SNF d/c, given covid earliest d/c date would be 4/6, to [facility] ? ?Chronic diastolic CHF (congestive heart failure) ([place]) ?Grade 1 diastolic dysfunction seen on echocardiogram in December 2021.  Elevated BNP.  Today does not appear fluid overloaded. Will monitor ? ?Hemoglobinuria ?Noted hemoglobinuria without too many red cells.  Normal CK level.  Could be secondary to prolonged immobilization versus COVID.  Treated w/ IVF which have been discontinued ? ?PAD (peripheral artery disease) ([place]) ?Continue wound care for vascular wounds.  Continue home pain regimen. ? ?Hypertension ?Not on any blood pressure medications at home.  Blood pressure elevated here.  Started on hydralazine p.o.  Improved somewhat.  We will also try some Lasix. ? ?Anxiety disorder ?Continue benzos ? ?Narcotic dependence ([place]) ?Patient on chronic MSIR for her postherpetic neuralgia and arterial disease wounds.  Continue. ? ?Postherpetic neuralgia ?Continue Topamax and MSIR ? ?HTN ?BP elevated ?- increase amlod to 5 ? ? ? ? ? ? ?Body mass index is 14.14 kg/m?[name]  ?Nutrition Problem: Severe Malnutrition ?Etiology: chronic illness (COVID+, PAD with chronic vascular ulcers, advanced age) ?Pressure Injury Ankle Anterior;Right (Active)  ?   ?Location: Ankle  ?Location Orientation: Anterior;Right  ?Staging:   ?Wound Description (Comments):   ?Present  on Admission:   ?  ? ?Consultants: ?Interventional radiology ?Palliative care ? ?Procedures: ?Status post  thoracentesis of right lung done 3/27: 450 cc of fluid removed. ? ?Antimicrobials: ?None ? ?Code Status: Full code ? ? ?Subjective: Patient breathing comfortably. Neuralgia bothering her.  ? ?Objective: ?Noted elevated blood pressures ?Vitals:  ? 10/04/21 0007 10/04/21 0552  ?BP: 139/65 (!) 161/64  ?Pulse: 93 96  ?Resp: 17 (!) 21  ?Temp: 98.7 ?F (37.1 ?C) 99.1 ?F (37.3 ?C)  ?SpO2: 94% 93%  ? ? ?Intake/Output Summary (Last 24 hours) at 10/04/2021 0910 ?Last data filed at 10/04/2021 0555 ?Gross per 24 hour  ?Intake 60 ml  ?Output 450 ml  ?Net -390 ml  ? ?Filed Weights  ? 10/01/21 1029  ?Weight: 38.6 kg  ? ?Body mass index is 14.14 kg/m?. ? ?Exam: ? ?General: Oriented x2, mild distress secondary to pain ?HEENT: Emaciated, frontal bossing.  Mucous membranes are dry ?Cardiovascular: Regular rate and rhythm, borderline tachycardia ?Respiratory: Poor inspiratory effort.  Better breath sounds on right side. ?Abdomen: Soft, nontender, nondistended, hypoactive bowel sounds ?Musculoskeletal: No clubbing or cyanosis or edema ?Skin: Noted peripheral ulcers ?Psychiatry: Appropriate, no evidence of psychoses ?Neurology: No focal deficits ? ?Data Reviewed: ?Noted increase in white blood cell count.  CRP only at 5.2. ? ?Disposition:  ?Status is: Inpatient ?Remains inpatient appropriate because: Further medical stabilization, need for thoracentesis, determination of goals of care ?  ? ?Anticipated discharge date: 3/31 ? ?Remaining issues to be resolved so that patient can be discharged: Goals of care ? ? ?Family Communication: Updated son by phone 3/29 ?DVT Prophylaxis: ?enoxaparin (LOVENOX) injection 30 mg Start: 10/03/21 2200  Resume Lovenox once heparin infusion discontinued.  Patient on heparin for chest pain earlier. ? ? ? ?Author: ?Desma Maxim ,MD ?10/04/2021 9:10 AM ? ?To reach On-call, see care teams to locate the attending and reach out via [email or web address]. ?Between 7PM-7AM, please contact night-coverage ?If you still have  difficulty reaching the attending provider, please page the [facility] (Director on Call) for [facility] on amion for assistance. ? ?

## 2021-10-04 NOTE — Progress Notes (Signed)
Occupational Therapy Treatment ?Patient Details ?Name: [name] ?MRN: [id number] ?DOB: July 03, 1928 ?Today's Date: 10/04/2021 ? ? ?History of present illness Pt is a 88 y.o. female presenting to hospital 3/26 with c/o weakness, cough, and AMS.  Pt slid and hit wall with L sided rib cage while upright day before hospital presentation.  (+) COVID-19.  Pt admitted with pleural effusion, COVID-19 (+), hypokalemia, htn, and protein-calorie malnutrition.  PMH includes anxiety, GERD, breast CA s/p B mastectomies, BCC of skin, partial thyroidectomy, h/o CVA, PAD with vascular ulcers L and R ankles, recent h/o postherpetic neuralgia affecting head and R eye, L IMN, and neck sx. ?  ?OT comments ? Pt seen for OT treatment on this date. Upon arrival to room, pt awake and seated upright in bed with son and daughter-in-law present. Pt reporting slight headache but agreeable to OT tx. Pt required MIN A for bed mobility. Once seated EOB, pt observed to be soiled in urine following purewick malfunction. Pt required SUPERVISION/SET-UP for seated UB dressing, MIN A for sit>stand transfers, and SUPERVISION for standing grooming tasks. Pt was able to perform x3 standing grooming tasks before requiring seated rest break. Following rest break, pt walked 81f with RW requiring MIN A. Pt continues to present with decreased strength and activity tolerance, and would benefit from additional skilled OT services to maximize return to PLOF and minimize risk of future falls, injury, caregiver burden, and readmission. Will continue to follow POC. Discharge recommendation remains appropriate.    ? ?Recommendations for follow up therapy are one component of a multi-disciplinary discharge planning process, led by the attending physician.  Recommendations may be updated based on patient status, additional functional criteria and insurance authorization. ?   ?Follow Up Recommendations ? Skilled nursing-short term rehab (<3 hours/day)  ?  ?Assistance  Recommended at Discharge Frequent or constant Supervision/Assistance  ?Patient can return home with the following ? A little help with walking and/or transfers;A little help with bathing/dressing/bathroom;Assistance with cooking/housework ?  ?Equipment Recommendations ? Other (comment) (defer to next venue of care)  ?  ?   ?Precautions / Restrictions Precautions ?Precautions: Fall ?Precaution Comments: orthostatic ?Restrictions ?Weight Bearing Restrictions: No  ? ? ?  ? ?Mobility Bed Mobility ?Overal bed mobility: Needs Assistance ?Bed Mobility: Sit to Supine ?  ?  ?Supine to sit: Min assist ?  ?  ?  ?  ? ?Transfers ?Overall transfer level: Needs assistance ?Equipment used: Rolling walker (2 wheels) ?Transfers: Sit to/from Stand ?Sit to Stand: Min assist ?  ?  ?  ?  ?  ?  ?  ?  ?Balance Overall balance assessment: Needs assistance ?Sitting-balance support: No upper extremity supported, Feet supported ?Sitting balance-Leahy Scale: Good ?Sitting balance - Comments: steady sitting reaching within BOS ?  ?Standing balance support: No upper extremity supported, During functional activity ?Standing balance-Leahy Scale: Fair ?Standing balance comment: Requires MIN GUARD for standing grooming tasks at sinkside ?  ?  ?  ?  ?  ?  ?  ?  ?  ?  ?  ?   ? ?ADL either performed or assessed with clinical judgement  ? ?ADL Overall ADL's : Needs assistance/impaired ?  ?  ?Grooming: Wash/dry hands;Wash/dry face;Brushing hair;Min guard;Standing ?  ?  ?  ?  ?  ?Upper Body Dressing : Supervision/safety;Sitting ?Upper Body Dressing Details (indicate cue type and reason): to doff/don hospital gown ?  ?  ?  ?  ?  ?  ?  ?  ?Functional mobility during  ADLs: Minimal assistance;Rolling walker (2 wheels) (to walk 51f, MIN A to navigate RW around environmental obstacles) ?  ?  ? ? ? ?Cognition Arousal/Alertness: Awake/alert ?Behavior During Therapy: [facility] tasks assessed/performed ?Overall Cognitive Status: Impaired/Different from baseline ?  ?   ?  ?  ?  ?  ?  ?  ?  ?  ?  ?  ?  ?  ?  ?  ?General Comments: Alert and oriented to self, place, and month/year. Fatigues quickly ?  ?  ?   ?   ?   ?General Comments SpO2 remained >90% on room air, HR: 100s-110s  ? ? ?Pertinent Vitals/ Pain       Pain Assessment ?Pain Assessment: Faces ?Faces Pain Scale: Hurts little more ?Pain Location: head ?Pain Descriptors / Indicators: Aching ?Pain Intervention(s): Limited activity within patient's tolerance, Monitored during session, Patient requesting pain meds-RN notified ? ?   ?   ? ?Frequency ? Min 2X/week  ? ? ? ? ?  ?Progress Toward Goals ? ?OT Goals(current goals can now be found in the care plan section) ? Progress towards OT goals: Progressing toward goals ? ?Acute Rehab OT Goals ?Patient Stated Goal: to get stronger ?OT Goal Formulation: With patient/family ?Time For Goal Achievement: 10/16/21 ?Potential to Achieve Goals: Fair  ?Plan Discharge plan remains appropriate;Frequency remains appropriate   ? ?   ?AM-PAC OT "6 Clicks" Daily Activity     ?Outcome Measure ? ? Help from another person eating meals?: None ?Help from another person taking care of personal grooming?: A Little ?Help from another person toileting, which includes using toliet, bedpan, or urinal?: A Lot ?Help from another person bathing (including washing, rinsing, drying)?: A Lot ?Help from another person to put on and taking off regular upper body clothing?: A Little ?Help from another person to put on and taking off regular lower body clothing?: A Lot ?6 Click Score: 16 ? ?  ?End of Session Equipment Utilized During Treatment: Rolling walker (2 wheels) ? ?OT Visit Diagnosis: Unsteadiness on feet (R26.81);History of falling (Z91.81);Muscle weakness (generalized) (M62.81) ?  ?Activity Tolerance Patient tolerated treatment well ?  ?Patient Left in chair;with call bell/phone within reach;with family/visitor present ?  ?Nurse Communication Mobility status ?  ? ?   ? ?Time: [address]?OT Time Calculation  (min): 26 min ? ?Charges: OT General Charges ?$OT Visit: 1 Visit ?OT Treatments ?$Self Care/Home Management : 23-37 mins ? ?LFredirick Maudlin OTR/L ?[place]? ?

## 2021-10-04 NOTE — Consult Note (Signed)
? ?                                                                                ?Consultation Note ?Date: 10/04/2021  ? ?Patient Name: [name]  ?DOB: 1928/04/06  MRN: [id number]  Age / Sex: 88 y.o., female  ?PCP: Venia Carbon, MD ?Referring Physician: Gwynne Edinger, MD ? ?Reason for Consultation: Establishing goals of care ? ?HPI/Patient Profile: 88 year old female with past medical history of severe protein calorie malnutrition, anxiety disorder and chronic pain from postherpetic neuralgia and peripheral arterial disease who goes to the wound care center who reportedly had COVID back in November and brought in on 3/26 for cough and loss of balance.  Patient found to be mildly hypoxic with a large right pleural effusion and COVID-positive.  Patient also found to be hypokalemic as well as hemoglobinuria.  Admitted to the hospitalist service. ? ?Clinical Assessment and Goals of Care: ?Patient is resting in bed. Per staff she has good PO intake, and is doing well otherwise. Patient appears thin, frail, and tired. She states she is tired. B[place] conversation and she tells me she wants her sons to make decisions for her. Clarified the gravity of he conversation and she states she is comfortable with them making all decisions including decisions on resuscitation.  ? ?Spoke with son Jori Moll. Jori Moll tells me patient lives alone, however he and his wife go several times per day to check on her and do things around the house. He states this has been a lot for he and his wife and she will not be able to return home. He states he has 1 sibling, Eddie. He provided number for Eddie. Spoke with [employer]. Discussed her status and he states he would be fine with whatever the patient and his brother Jori Moll determines is best whether it be continued aggressive care or comfort focused care. ? ?Spoke with Jori Moll again. He tells me she has a bed at a local SNF. He states he would like to do SNF, as she cannot come home.   ? ?He would like to see how she does but understands she is tired and likely will not do well overall. He would like to continue to evaluate for decline while she is waiting for her covid quarantine to end, and if patient declines he would want to shift to hospice with comfort care. He does not want IV fluids or a feeding tube to supplement intake. He does not want CPR or ventilator support.   ? ?I completed a MOST form today with son Jori Moll through [place] and the signed original was placed in the chart. A photocopy was placed in the chart to be scanned into EMR. The patient outlined their wishes for the following treatment decisions: ? ?Cardiopulmonary Resuscitation: Do Not Attempt Resuscitation (DNR/No CPR)  ?Medical Interventions: Limited Additional Interventions: Use medical treatment, IV fluids and cardiac monitoring as indicated, DO NOT USE intubation or mechanical ventilation. May consider use of less invasive airway support such as BiPAP or CPAP. Also provide comfort measures. Transfer to the hospital if indicated. Avoid intensive care.   ?Antibiotics: Antibiotics if indicated  ?IV Fluids: No IV fluids (  provide other measures to ensure comfort)  ?Feeding Tube: No feeding tube  ?  ?  ? ?SUMMARY OF RECOMMENDATIONS   ?Work towards SNF. If she declines while she waits for SNF admission, he would want to shift to hospice with comfort care.  ? ? ?  ? ?Primary Diagnoses: ?Present on Admission: ? Pleural effusion ? Hypertension ? Postherpetic neuralgia ? Narcotic dependence ([place]) ? PAD (peripheral artery disease) ([place]) ? Benzodiazepine dependence ([place]) ? COVID-19 virus infection ? Anxiety disorder ? Hemoglobinuria ? Hypokalemia ? Chronic diastolic CHF (congestive heart failure) ([place]) ? Protein-calorie malnutrition, severe ? ? ?I have reviewed the medical record, interviewed the patient and family, and examined the patient. The following aspects are pertinent. ? ?Past Medical History:  ?Diagnosis Date  ? Arrhythmia    ? Arthritis   ? Atypical chest pain   ? Lexiscan myoview (5/11) with EF 84%, normal wall motion, small fixed apical  perfusion defect likely due to breast attenuation, no evidence for ischemia or infarction.  **Patient had an  ? Basal cell carcinoma 07/28/19 [facility]  ? R pretibial  ? Breast cancer ([place])   ? Bilateral mastectomies 1986.  ? CVA (cerebral infarction) 10/12  ? right lacunar  ? Depression   ? Dyspnea   ? Echo (5/11) was a difficult study due to breast implants but showed normal LV and RV size and systolic function.      ? GERD (gastroesophageal reflux disease)   ? Headache   ? s/p shingles - right side of head  ? History of partial thyroidectomy   ? Hyperlipidemia   ? Hypertension   ? in past - no current meds/issues  ? Obstruction of intestine ([facility])   ? Partial small bowel obstruction ([place]) 7/14  ? no surgery  ? Superficial basal cell carcinoma 06/18/2019  ? R mid to distal pretibial  ? Wears dentures   ? full upper  ? Zoster   ?  with Post-herpetic neuralgia  ? ?Social History  ? ?Socioeconomic History  ? Marital status: Widowed  ?  Spouse name: Not on file  ? Number of children: 2  ? Years of education: Not on file  ? Highest education level: Not on file  ?Occupational History  ? Occupation: Special educational needs teacher  ?  Employer: RETIRED  ?Tobacco Use  ? Smoking status: Former  ? Smokeless tobacco: Never  ?Substance and Sexual Activity  ? Alcohol use: No  ?  Alcohol/week: 0.0 standard drinks  ? Drug use: No  ? Sexual activity: Not Currently  ?Other Topics Concern  ? Not on file  ?Social History Narrative  ? No living will  ? No health care POA but requests daughter-in-law [name] to do this  ? Would like attempts at resuscitation  ? No feeding tube   ?   ? MOST form done 08/17/21---she wants everything except tube feeds  ? ?Social Determinants of Health  ? ?Financial Resource Strain: Not on file  ?Food Insecurity: Not on file  ?Transportation Needs: Not on file  ?Physical Activity: Not on file  ?Stress: Not on file   ?Social Connections: Not on file  ? ?Family History  ?Problem Relation Age of Onset  ? Heart disease Son   ?     open heart surgery for a blockage  ? Heart Problems Son   ? Diabetes Son   ? Parkinson's disease Son   ? ?Scheduled Meds: ? acyclovir  400 mg Oral TID  ? amLODipine  5 mg Oral  Daily  ? vitamin C  500 mg Oral Daily  ? cycloSPORINE  1 drop Both Eyes BID  ? enoxaparin (LOVENOX) injection  30 mg Subcutaneous Q24H  ? feeding supplement  237 mL Oral BID BM  ? hydrALAZINE  10 mg Oral Q8H  ? loratadine  10 mg Oral Daily  ? LORazepam  1 mg Oral QHS  ? multivitamin with minerals  1 tablet Oral Daily  ? pantoprazole  40 mg Oral BID  ? phosphorus  500 mg Oral Q4H  ? polyethylene glycol  17 g Oral QHS  ? sodium chloride flush  3 mL Intravenous Q12H  ? topiramate  50 mg Oral QHS  ? zinc sulfate  220 mg Oral Daily  ? ?Continuous Infusions: ?PRN Meds:.acetaminophen, azelastine, chlorpheniramine-HYDROcodone, guaiFENesin-dextromethorphan, metoprolol tartrate, morphine, promethazine ?Medications Prior to Admission:  ?Prior to Admission medications   ?Medication Sig Start Date End Date Taking? Authorizing Provider  ?acyclovir (ZOVIRAX) 400 MG tablet Take 1 tablet (400 mg total) by mouth 3 (three) times daily. 08/17/21  Yes Venia Carbon, MD  ?azelastine (ASTELIN) 0.1 % nasal spray Place 2 sprays into both nostrils 2 (two) times daily. 05/20/20  Yes [provider]  ?fexofenadine (ALLEGRA) 180 MG tablet Take 180 mg by mouth daily.   Yes [provider]  ?LORazepam (ATIVAN) 1 MG tablet TAKE 1/2 TO 1 TABLET BY MOUTH TWICE DAILY AS NEEDED FOR ANXIETY ?Patient taking differently: Take 1 mg by mouth at bedtime. TAKE 1/2 TO 1 TABLET BY MOUTH TWICE DAILY AS NEEDED FOR ANXIETY 08/14/21  Yes Viviana Simpler I, MD  ?morphine (MSIR) 15 MG tablet TAKE 1 TABLET BY MOUTH 3 TIMES DAILY AS NEEDED FOR PAIN 09/12/21  Yes Kennyth Arnold, FNP  ?Multiple Vitamin (MULTIVITAMIN) tablet Take 1 tablet by mouth once daily   Yes  [provider]  ?pantoprazole (PROTONIX) 40 MG tablet TAKE 1 TABLET BY MOUTH TWICE A DAY 12/09/20  Yes Venia Carbon, MD  ?polyethylene glycol (MIRALAX / GLYCOLAX) packet Take 17 g by mouth at bed

## 2021-10-04 NOTE — Progress Notes (Signed)
Physical Therapy Treatment ?Patient Details ?Name: [name] ?MRN: [id number] ?DOB: June 27, 1928 ?Today's Date: 10/04/2021 ? ? ?History of Present Illness Pt is a 88 y.o. female presenting to hospital 3/26 with c/o weakness, cough, and AMS.  Pt slid and hit wall with L sided rib cage while upright day before hospital presentation.  (+) COVID-19.  Pt admitted with pleural effusion, COVID-19 (+), hypokalemia, htn, and protein-calorie malnutrition.  PMH includes anxiety, GERD, breast CA s/p B mastectomies, BCC of skin, partial thyroidectomy, h/o CVA, PAD with vascular ulcers L and R ankles, recent h/o postherpetic neuralgia affecting head and R eye, L IMN, and neck sx. ? ?  ?PT Comments  ? ? Patient received in supine with HOB slightly elevated and feeder present. No pain reported throughout session, however patient continues to fatigue easily. Patient tolerated session well and was agreeable to treatment. Patient continues to require Min A for supine to sit bed mobility, however was able to perform sit to supine at supervision. Patient completed therapeutic exercises sitting EOB for BLE strengthening (AP, LAQ, and seated marches.) Transfers from EOB continue to require Min A. Patient was able to tolerate ~25 feet to/from doorway. Increased fatigue noted at conclusion of ambulation. Patient is progressing towards her goals, and continue to require skilled physical therapy in order to optimize patient's return to PLOF. Continue to recommend STR upon discharge from acute hospitalization.  ?  ?Recommendations for follow up therapy are one component of a multi-disciplinary discharge planning process, led by the attending physician.  Recommendations may be updated based on patient status, additional functional criteria and insurance authorization. ? ?Follow Up Recommendations ? Skilled nursing-short term rehab (<3 hours/day) ?  ?  ?Assistance Recommended at Discharge Frequent or constant Supervision/Assistance  ?Patient can  return home with the following A lot of help with walking and/or transfers;A lot of help with bathing/dressing/bathroom;Assistance with cooking/housework;Assist for transportation ?  ?Equipment Recommendations ? Rolling walker (2 wheels);BSC/3in1  ?  ?Recommendations for Other Services   ? ? ?  ?Precautions / Restrictions Precautions ?Precautions: Fall ?Restrictions ?Weight Bearing Restrictions: No  ?  ? ?Mobility ? Bed Mobility ?Overal bed mobility: Needs Assistance ?Bed Mobility: Sit to Supine ?  ?  ?Supine to sit: Min assist ?Sit to supine: Supervision ?  ?General bed mobility comments: assit to sitting through R [facility]HA ?Patient Response: Cooperative ? ?Transfers ?Overall transfer level: Needs assistance ?Equipment used: Rolling walker (2 wheels) ?Transfers: Sit to/from Stand ?Sit to Stand: Min assist (SBA) ?  ?  ?  ?  ?  ?  ?  ? ?Ambulation/Gait ?Ambulation/Gait assistance: Min guard (SBA) ?Gait Distance (Feet): 25 Feet ?Assistive device: Rolling walker (2 wheels) ?Gait Pattern/deviations: Shuffle ?Gait velocity: decreased ?  ?  ?General Gait Details: decreased B LE step length/foot clearance ? ? ?Stairs ?  ?  ?  ?  ?  ? ? ?Wheelchair Mobility ?  ? ?Modified Rankin (Stroke Patients Only) ?  ? ? ?  ?Balance Overall balance assessment: Needs assistance ?Sitting-balance support: No upper extremity supported, Feet supported ?Sitting balance-Leahy Scale: Good ?Sitting balance - Comments: steady sitting reaching within BOS ?  ?Standing balance support: Bilateral upper extremity supported, During functional activity ?Standing balance-Leahy Scale: Fair ?  ?  ?  ?  ?  ?  ?  ?  ?  ?  ?  ?  ?  ? ?  ?Cognition Arousal/Alertness: Awake/alert ?Behavior During Therapy: [facility] for tasks assessed/performed ?Overall Cognitive Status: No family/caregiver present to determine baseline  cognitive functioning ?  ?  ?  ?  ?  ?  ?  ?  ?  ?  ?  ?  ?  ?  ?  ?  ?General Comments: pleasant to work with ?  ?  ? ?  ?Exercises General Exercises -  Lower Extremity ?Ankle Circles/Pumps: Seated, AROM, 20 reps, Both, Strengthening ?Long Arc Quad: Seated, AROM, 10 reps, Strengthening, Both ?Hip Flexion/Marching: Seated, AROM, 20 reps, Both, Strengthening ? ?  ?General Comments General comments (skin integrity, edema, etc.): SpO2 remained >90% on room air, HR: 96-99bpm ?  ?  ? ?Pertinent Vitals/Pain Pain Assessment ?Pain Assessment: No/denies pain  ? ? ?Home Living   ?  ?  ?  ?  ?  ?  ?  ?  ?  ?   ?  ?Prior Function    ?  ?  ?   ? ?PT Goals (current goals can now be found in the care plan section) Acute Rehab PT Goals ?Patient Stated Goal: to go home ?PT Goal Formulation: With patient ?Time For Goal Achievement: 10/16/21 ?Potential to Achieve Goals: Fair ?Progress towards PT goals: Progressing toward goals ? ?  ?Frequency ? ? ? Min 2X/week ? ? ? ?  ?PT Plan Current plan remains appropriate  ? ? ?Co-evaluation   ?  ?  ?  ?  ? ?  ?AM-PAC PT "6 Clicks" Mobility   ?Outcome Measure ?   ?Help needed moving from lying on your back to sitting on the side of a flat bed without using bedrails?: A Lot ?Help needed moving to and from a bed to a chair (including a wheelchair)?: A Little ?Help needed standing up from a chair using your arms (e.g., wheelchair or bedside chair)?: A Little ?Help needed to walk in hospital room?: A Little ?Help needed climbing 3-5 steps with a railing? : Total ?6 Click Score: 12 ? ?  ?End of Session Equipment Utilized During Treatment: Gait belt ?Activity Tolerance: Patient limited by fatigue ?Patient left: in bed;with call bell/phone within reach;with chair alarm set ?Nurse Communication: Mobility status;Precautions ?PT Visit Diagnosis: Unsteadiness on feet (R26.81);Other abnormalities of gait and mobility (R26.89);Muscle weakness (generalized) (M62.81);History of falling (Z91.81) ?  ? ? ?Time: 1443-1540 ?PT Time Calculation (min) (ACUTE ONLY): 18 min ? ?Charges:  $Therapeutic Exercise: 8-22 mins          ?          ? ?Iva Boop, PT   ?10/04/21. 2:02 PM ? ? ?

## 2021-10-04 NOTE — TOC Progression Note (Signed)
Transition of Care (TOC) - Progression Note  ? ? ?Patient Details  ?Name: [name] ?MRN: [id number] ?Date of Birth: [birth year] ? ?Transition of Care (TOC) CM/SW Contact  ?Pete Pelt, RN ?Phone Number: ?10/04/2021, 8:52 AM ? ?Clinical Narrative:   [facility] is only bed offer at this time.  Message left for son to discuss, awaiting response.  tOC to follow. ? ? ? ?Expected Discharge Plan: [place] ?Barriers to Discharge: Continued Medical Work up ? ?Expected Discharge Plan and Services ?Expected Discharge Plan: [place] ?  ?Discharge Planning Services: CM Consult ?Post Acute Care Choice: [place] ?Living arrangements for the past 2 months: Apartment ?                ?  ?  ?  ?  ?  ?  ?  ?  ?  ?  ? ? ?Social Determinants of Health (SDOH) Interventions ?  ? ?Readmission Risk Interventions ?   ? View : No data to display.  ?  ?  ?  ? ? ?

## 2021-10-04 NOTE — Consult Note (Signed)
PHARMACY CONSULT NOTE - FOLLOW UP ? ?Pharmacy Consult for Electrolyte Monitoring and Replacement  ? ?Recent Labs: ?Potassium (mmol/L)  ?Date Value  ?10/04/2021 5.4 (H)  ?01/22/2013 3.8  ? ?Magnesium (mg/dL)  ?Date Value  ?10/04/2021 2.0  ? ?Calcium (mg/dL)  ?Date Value  ?10/04/2021 8.6 (L)  ? ?Calcium, Total (mg/dL)  ?Date Value  ?01/19/2013 8.3 (L)  ? ?Albumin (g/dL)  ?Date Value  ?10/04/2021 3.1 (L)  ?01/17/2013 3.9  ? ?Phosphorus (mg/dL)  ?Date Value  ?10/04/2021 2.0 (L)  ? ?Sodium (mmol/L)  ?Date Value  ?10/04/2021 135  ?01/27/2018 141  ?01/19/2013 142  ? ?Corrected Calcium: 9.3 mg/dL ? ? ?Assessment: ?88 y.o. female with medical history significant of arthritis, breast cancer, h/o BCC of the skin, partial thyroidectomy, GERD, HLD, h/o CVA, PAD with vascular ulcers on the left and right ankles, and recent h/o postherpetic neuralgia affecting head and right eye who presented with cough and balance issues. Pharmacy has been consulted for electrolyte management.  ? ?Goal of Therapy:  ?Electrolytes WNL  ? ?Plan:  ?K 5.4 mEq/L. Hold scheduled potassium chloride 3mq PO BID given hyperkalemia  ?Phos 2.0 mg/dL. Give K-Phos Neutral tablet 2 tabs every 4 hours x 4 doses ?Other labs WNL. F/u electrolytes with AM labs  ? ? ?Thank you for allowing pharmacy to be a part of this patient?s care. ? ?ADarrick Penna PharmD, MS PGPM ?Clinical Pharmacist ?10/04/2021 ?8:48 AM ? ? ?

## 2021-10-04 NOTE — TOC Progression Note (Addendum)
Transition of Care (TOC) - Progression Note  ? ? ?Patient Details  ?Name: [name] ?MRN: [id number] ?Date of Birth: 12/07/1927 ? ?Transition of Care (TOC) CM/SW Contact  ?Pete Pelt, RN ?Phone Number: ?10/04/2021, 11:02 AM ? ?Clinical Narrative:   Spoke to son, in agreement with [facility].  Magda Paganini notified.  Health team advantage started. ? ?Addendum 17  Will start Health team advantage closer to discharge date, patient was COVID + on 03/26 eligible for transfer to facility 4/6 ? ?Expected Discharge Plan: [place] ?Barriers to Discharge: Continued Medical Work up ? ?Expected Discharge Plan and Services ?Expected Discharge Plan: [place] ?  ?Discharge Planning Services: CM Consult ?Post Acute Care Choice: [place] ?Living arrangements for the past 2 months: Apartment ?                ?  ?  ?  ?  ?  ?  ?  ?  ?  ?  ? ? ?Social Determinants of Health (SDOH) Interventions ?  ? ?Readmission Risk Interventions ?   ? View : No data to display.  ?  ?  ?  ? ? ?

## 2021-10-05 ENCOUNTER — Encounter: Payer: Self-pay | Admitting: Internal Medicine

## 2021-10-05 DIAGNOSIS — J90 Pleural effusion, not elsewhere classified: Secondary | ICD-10-CM | POA: Diagnosis not present

## 2021-10-05 LAB — BODY FLUID CULTURE W GRAM STAIN: Culture: NO GROWTH

## 2021-10-05 LAB — BASIC METABOLIC PANEL
Anion gap: 10 (ref 5–15)
BUN: 30 mg/dL — ABNORMAL HIGH (ref 8–23)
CO2: 24 mmol/L (ref 22–32)
Calcium: 7.7 mg/dL — ABNORMAL LOW (ref 8.9–10.3)
Chloride: 105 mmol/L (ref 98–111)
Creatinine, Ser: 0.77 mg/dL (ref 0.44–1.00)
GFR, Estimated: >60 mL/min (ref >60)
Glucose, Bld: 114 mg/dL — ABNORMAL HIGH (ref 70–99)
Potassium: 3.7 mmol/L (ref 3.5–5.1)
Sodium: 139 mmol/L (ref 135–145)

## 2021-10-05 LAB — CBC
HCT: 33.4 % — ABNORMAL LOW (ref 36.0–46.0)
Hemoglobin: 10.8 g/dL — ABNORMAL LOW (ref 12.0–15.0)
MCH: 28.7 pg (ref 26.0–34.0)
MCHC: 32.3 g/dL (ref 30.0–36.0)
MCV: 88.8 fL (ref 80.0–100.0)
Platelets: 182 10*3/uL (ref 150–400)
RBC: 3.76 MIL/uL — ABNORMAL LOW (ref 3.87–5.11)
RDW: 14.1 % (ref 11.5–15.5)
WBC: 11.1 10*3/uL — ABNORMAL HIGH (ref 4.0–10.5)
nRBC: 0 % (ref 0.0–0.2)

## 2021-10-05 LAB — ALBUMIN: Albumin: 2.8 g/dL — ABNORMAL LOW (ref 3.5–5.0)

## 2021-10-05 MED ORDER — MORPHINE SULFATE (PF) 2 MG/ML IV SOLN
2.0000 mg | Freq: Once | INTRAVENOUS | Status: AC
  Administered 2021-10-05: 2 mg via INTRAVENOUS
  Filled 2021-10-05: qty 1

## 2021-10-05 MED ORDER — SALINE SPRAY 0.65 % NA SOLN
1.0000 | NASAL | Status: DC | PRN
  Administered 2021-10-05: 1 via NASAL
  Filled 2021-10-05: qty 44

## 2021-10-05 MED ORDER — MAGIC MOUTHWASH
15.0000 mL | Freq: Four times a day (QID) | ORAL | Status: DC | PRN
  Administered 2021-10-05 – 2021-10-07 (×3): 15 mL via ORAL
  Filled 2021-10-05 (×3): qty 15

## 2021-10-05 NOTE — Plan of Care (Signed)

## 2021-10-05 NOTE — Plan of Care (Signed)
PMT note:  ? ?Per staff, patient is maintaining current status without decline. Continue plans for SNF. If patient declines, son would like to proceed with hospice placement.  ?

## 2021-10-05 NOTE — Progress Notes (Signed)
Physical Therapy Treatment ?Patient Details ?Name: [name] ?MRN: [id number] ?DOB: 05-01-1928 ?Today's Date: 10/05/2021 ? ? ?History of Present Illness Pt is a 88 y.o. female presenting to hospital 3/26 with c/o weakness, cough, and AMS.  Pt slid and hit wall with L sided rib cage while upright day before hospital presentation.  (+) COVID-19.  Pt admitted with pleural effusion, COVID-19 (+), hypokalemia, htn, and protein-calorie malnutrition.  PMH includes anxiety, GERD, breast CA s/p B mastectomies, BCC of skin, partial thyroidectomy, h/o CVA, PAD with vascular ulcers L and R ankles, recent h/o postherpetic neuralgia affecting head and R eye, L IMN, and neck sx. ? ?  ?PT Comments  ? ? Pt received in Semi-Fowler's position and agreeable to therapy.  Pt performed much better with bed mobility today, requiring only supervision at this time.  Pt then proceeded to perform seated exercises prior to standing and ambulating in the room.  Pt was able to increase distance of ambulation to the doorward and back to bed x2 this session before returning to bed.  All needs met and pt given warm blanket for comfort.  Current discharge plans to SNF remain appropriate at this time.  Pt will continue to benefit from skilled therapy in order to address deficits listed below. ?  ?Recommendations for follow up therapy are one component of a multi-disciplinary discharge planning process, led by the attending physician.  Recommendations may be updated based on patient status, additional functional criteria and insurance authorization. ? ?Follow Up Recommendations ? Skilled nursing-short term rehab (<3 hours/day) ?  ?  ?Assistance Recommended at Discharge Frequent or constant Supervision/Assistance  ?Patient can return home with the following A lot of help with walking and/or transfers;A lot of help with bathing/dressing/bathroom;Assistance with cooking/housework;Assist for transportation ?  ?Equipment Recommendations ? Rolling walker  (2 wheels);BSC/3in1  ?  ?Recommendations for Other Services   ? ? ?  ?Precautions / Restrictions Precautions ?Precautions: Fall ?Precaution Comments: orthostatic ?Restrictions ?Weight Bearing Restrictions: No  ?  ? ?Mobility ? Bed Mobility ?Overal bed mobility: Needs Assistance ?Bed Mobility: Sit to Supine ?  ?  ?Supine to sit: Supervision ?Sit to supine: Supervision ?  ?General bed mobility comments: performed supine to sit without assistance. ?  ? ?Transfers ?Overall transfer level: Needs assistance ?Equipment used: Rolling walker (2 wheels) ?Transfers: Sit to/from Stand ?Sit to Stand: Supervision ?  ?  ?  ?  ?  ?  ?  ? ?Ambulation/Gait ?Ambulation/Gait assistance: Min guard ?Gait Distance (Feet): 50 Feet ?Assistive device: Rolling walker (2 wheels) ?Gait Pattern/deviations: Shuffle ?Gait velocity: decreased ?  ?  ?  ? ? ?Stairs ?  ?  ?  ?  ?  ? ? ?Wheelchair Mobility ?  ? ?Modified Rankin (Stroke Patients Only) ?  ? ? ?  ?Balance Overall balance assessment: Needs assistance ?Sitting-balance support: No upper extremity supported, Feet supported ?Sitting balance-Leahy Scale: Good ?Sitting balance - Comments: steady sitting reaching within BOS ?  ?Standing balance support: No upper extremity supported, During functional activity ?Standing balance-Leahy Scale: Fair ?  ?  ?  ?  ?  ?  ?  ?  ?  ?  ?  ?  ?  ? ?  ?Cognition Arousal/Alertness: Awake/alert ?Behavior During Therapy: [facility] for tasks assessed/performed ?Overall Cognitive Status: Impaired/Different from baseline ?  ?  ?  ?  ?  ?  ?  ?  ?  ?  ?  ?  ?  ?  ?  ?  ?General  Comments: Alert and oriented to self, place. ?  ?  ? ?  ?Exercises General Exercises - Lower Extremity ?Ankle Circles/Pumps: AROM, Strengthening, Both, 20 reps, Seated ?Long Arc Quad: AROM, Strengthening, Both, 10 reps, Seated ?Hip Flexion/Marching: AROM, Strengthening, Both, 20 reps, Seated ? ?  ?General Comments General comments (skin integrity, edema, etc.): HR more elevated today to  110's-120's ?  ?  ? ?Pertinent Vitals/Pain Pain Assessment ?Pain Assessment: No/denies pain  ? ? ?Home Living   ?  ?  ?  ?  ?  ?  ?  ?  ?  ?   ?  ?Prior Function    ?  ?  ?   ? ?PT Goals (current goals can now be found in the care plan section) Acute Rehab PT Goals ?Patient Stated Goal: to go home ?PT Goal Formulation: With patient ?Time For Goal Achievement: 10/16/21 ?Potential to Achieve Goals: Fair ?Progress towards PT goals: Progressing toward goals ? ?  ?Frequency ? ? ? Min 2X/week ? ? ? ?  ?PT Plan Current plan remains appropriate  ? ? ?Co-evaluation   ?  ?  ?  ?  ? ?  ?AM-PAC PT "6 Clicks" Mobility   ?Outcome Measure ?   ?Help needed moving from lying on your back to sitting on the side of a flat bed without using bedrails?: A Lot ?Help needed moving to and from a bed to a chair (including a wheelchair)?: A Little ?Help needed standing up from a chair using your arms (e.g., wheelchair or bedside chair)?: A Little ?Help needed to walk in hospital room?: A Little ?Help needed climbing 3-5 steps with a railing? : A Lot ?6 Click Score: 13 ? ?  ?End of Session   ?Activity Tolerance: Patient limited by fatigue ?Patient left: in bed;with call bell/phone within reach;with chair alarm set ?Nurse Communication: Mobility status;Precautions ?PT Visit Diagnosis: Unsteadiness on feet (R26.81);Other abnormalities of gait and mobility (R26.89);Muscle weakness (generalized) (M62.81);History of falling (Z91.81) ?  ? ? ?Time: 2831-5176 ?PT Time Calculation (min) (ACUTE ONLY): 27 min ? ?Charges:  $Gait Training: 8-22 mins ?$Therapeutic Exercise: 8-22 mins          ?          ? ?[name], PT, DPT ?10/05/21, 5:05 PM ? ? ? ?[name] ?10/05/2021, 4:58 PM ? ?

## 2021-10-05 NOTE — Progress Notes (Signed)
[facility] Progress Note ? ?Patient: [name]    JME:[id number]  DOA: 10/01/2021    ?Date of Service: the patient was seen and examined on 10/05/2021 ? ?Brief hospital course: ?88 year old female with past medical history of severe protein calorie malnutrition, anxiety disorder and chronic pain from postherpetic neuralgia and peripheral arterial disease who goes to the wound care center who reportedly had COVID back in November and brought in on 3/26 for cough and loss of balance.  Patient found to be mildly hypoxic with a large right pleural effusion and COVID-positive.  Patient also found to be hypokalemic as well as hemoglobinuria.  Admitted to the hospitalist service. ?  ?On morning of 3/27, patient had episode of persistent SVT requiring 1 dose of IV Lopressor which resolved the issue.  That afternoon, interventional radiology performed right-sided thoracentesis removing 450 cc of fluid.  On early morning of 3/28, patient complaining of some chest pain.  Troponins noted minimal elevation.  Cardiology reviewed case, but felt that given her advanced age and minimal change in troponin, no intervention recommended at this time.  Patient seen by palliative care, and plans are for family meeting. ? ?Assessment and Plan: ?Assessment and Plan: ?Pleural effusion ?Unclear etiology for cause.?  COVID.  Status post thoracentesis 3/27. Exudative by protein (LDH not obtained). Culture negative. Clinically improved, weaned off o2. Will repeat CXR on 3/29 does not how reaccumulation. Will plan to monitor clinically. ? ?SVT (supraventricular tachycardia) ([place]) ?Following admission, patient went into episodes of SVT with a heart rate as high as 160s.  Likely set off by hypokalemia and large pleural effusion.  Gave 1 dose of IV Lopressor which seem to have resolved situation.  IV Lopressor made as needed. ? ?COVID-19 virus infection ?Patient reportedly has had symptoms for 2 weeks.  Also reportedly had COVID 4 months  ago.  Crp remains elevated. ? ?Hyperkalemia ?Iatrogenic, resolved with stopping potassium supplement ? ?Protein-calorie malnutrition, severe ?BMI only 14.  High mortality risk. ? ?Nutrition Status: ?Nutrition Problem: Increased nutrient needs ?Etiology: acute illness ?Signs/Symptoms: meal completion < 25% ?Interventions: Ensure Enlive (each supplement provides 350kcal and 20 grams of protein), MVI, Magic cup ? ?Debility ?Hospice eligible. Plan for SNF d/c, given covid earliest d/c date would be 4/6, to [facility] ? ?Chronic diastolic CHF (congestive heart failure) ([place]) ?Grade 1 diastolic dysfunction seen on echocardiogram in December 2021.  Elevated BNP.  Today does not appear fluid overloaded. Will monitor ? ?Hemoglobinuria ?Noted hemoglobinuria without too many red cells.  Normal CK level.  Could be secondary to prolonged immobilization versus COVID.  Treated w/ IVF which have been discontinued ? ?PAD (peripheral artery disease) ([place]) ?Continue wound care for vascular wounds.  Continue home pain regimen. ? ?Anxiety disorder ?Continue benzos ? ?Narcotic dependence ([place]) ?Patient on chronic MSIR for her postherpetic neuralgia and arterial disease wounds.  Continue. ? ?Postherpetic neuralgia ?Continue Topamax and MSIR. Is on daily acyclovir from PCP for what they described as "chronic hsv infection" ? ?HTN ?BP elevated ?- increase amlod to 5 ? ? ?Body mass index is 16.58 kg/m?[name]  ?Nutrition Problem: Severe Malnutrition ?Etiology: chronic illness (COVID+, PAD with chronic vascular ulcers, advanced age) ?Pressure Injury Ankle Anterior;Right (Active)  ?   ?Location: Ankle  ?Location Orientation: Anterior;Right  ?Staging:   ?Wound Description (Comments):   ?Present on Admission:   ?  ? ?Consultants: ?Interventional radiology ?Palliative care ? ?Procedures: ?Status post thoracentesis of right lung done 3/27: 450 cc of fluid removed. ? ?Antimicrobials: ?None ? ?  Code Status: Full code ? ? ?Subjective: Patient breathing  comfortably. Chronic foot pain present. Ate a small amount of lunch ? ?Objective: ?Noted elevated blood pressures ?Vitals:  ? 10/05/21 0732 10/05/21 1024  ?BP: 139/68 139/68  ?Pulse: 80 80  ?Resp: 18 18  ?Temp: 97.7 ?F (36.5 ?C) 97.7 ?F (36.5 ?C)  ?SpO2: 92%   ? ? ?Intake/Output Summary (Last 24 hours) at 10/05/2021 1321 ?Last data filed at 10/05/2021 0745 ?Gross per 24 hour  ?Intake 480 ml  ?Output 300 ml  ?Net 180 ml  ? ?Filed Weights  ? 10/01/21 1029 10/05/21 1024  ?Weight: 38.6 kg 38.5 kg  ? ?Body mass index is 16.58 kg/m?. ? ?Exam: ? ?General: Oriented x2, mild distress secondary to pain ?HEENT: Emaciated, frontal bossing.  Mucous membranes are dry ?Cardiovascular: Regular rate and rhythm, borderline tachycardia ?Respiratory: Poor inspiratory effort.  Better breath sounds on right side. ?Abdomen: Soft, nontender, nondistended, hypoactive bowel sounds ?Musculoskeletal: No clubbing or cyanosis or edema ?Skin: Noted peripheral ulcers ?Psychiatry: Appropriate, no evidence of psychoses ?Neurology: No focal deficits ? ?Data Reviewed: ?Noted increase in white blood cell count.  CRP only at 5.2. ? ?Disposition:  ?Status is: Inpatient ?Remains inpatient appropriate because: Further medical stabilization, need for thoracentesis, determination of goals of care ?  ? ?Anticipated discharge date: 3/31 ? ?Remaining issues to be resolved so that patient can be discharged: covid quarantine ? ? ?Family Communication: Updated son by phone 3/30 ?DVT Prophylaxis: ?enoxaparin (LOVENOX) injection 30 mg Start: 10/03/21 2200   ? ? ? ?Author: ?Desma Maxim ,MD ?10/05/2021 1:21 PM ? ?To reach On-call, see care teams to locate the attending and reach out via [email or web address]. ?Between 7PM-7AM, please contact night-coverage ?If you still have difficulty reaching the attending provider, please page the [facility] (Director on Call) for [facility] on amion for assistance. ? ?

## 2021-10-05 NOTE — Consult Note (Addendum)
PHARMACY CONSULT NOTE - FOLLOW UP ? ?Pharmacy Consult for Electrolyte Monitoring and Replacement  ? ?Recent Labs: ?Potassium (mmol/L)  ?Date Value  ?10/05/2021 3.7  ?01/22/2013 3.8  ? ?Magnesium (mg/dL)  ?Date Value  ?10/04/2021 2.0  ? ?Calcium (mg/dL)  ?Date Value  ?10/05/2021 7.7 (L)  ? ?Calcium, Total (mg/dL)  ?Date Value  ?01/19/2013 8.3 (L)  ? ?Albumin (g/dL)  ?Date Value  ?10/04/2021 3.1 (L)  ?01/17/2013 3.9  ? ?Phosphorus (mg/dL)  ?Date Value  ?10/04/2021 2.0 (L)  ? ?Sodium (mmol/L)  ?Date Value  ?10/05/2021 139  ?01/27/2018 141  ?01/19/2013 142  ? ?Corrected Calcium: 8.7 ? ? ?Assessment: ?88 y.o. female with medical history significant of arthritis, breast cancer, h/o BCC of the skin, partial thyroidectomy, GERD, HLD, h/o CVA, PAD with vascular ulcers on the left and right ankles, and recent h/o postherpetic neuralgia affecting head and right eye who presented with cough and balance issues. Pharmacy has been consulted for electrolyte management.  ? ?Goal of Therapy:  ?Electrolytes WNL  ? ?Plan:  ?Serum calcium 7.7 today. Ordered add-on albumin to measure corrected calcium for potential repletion ?Corrected Calcium 8.7. No repletion at this time ?Phos and Magnesium not drawn. Other labs WNL. ?Will f/up BMP, albumin, Mg, and Phos with AM labs tomorrow.  ? ? ?Thank you for allowing pharmacy to be a part of this patient?s care. ? ?Darrick Penna, PharmD, MS PGPM ?Clinical Pharmacist ?10/05/2021 ?9:11 AM ? ? ?

## 2021-10-06 ENCOUNTER — Encounter: Payer: PPO | Admitting: Physician Assistant

## 2021-10-06 DIAGNOSIS — J90 Pleural effusion, not elsewhere classified: Secondary | ICD-10-CM | POA: Diagnosis not present

## 2021-10-06 NOTE — Progress Notes (Signed)
Physical Therapy Treatment ?Patient Details ?Name: [name] ?MRN: [id number] ?DOB: 10/22/1927 ?Today's Date: 10/06/2021 ? ? ?History of Present Illness Pt is a 88 y.o. female presenting to hospital 3/26 with c/o weakness, cough, and AMS.  Pt slid and hit wall with L sided rib cage while upright day before hospital presentation.  (+) COVID-19.  Pt admitted with pleural effusion, COVID-19 (+), hypokalemia, htn, and protein-calorie malnutrition.  PMH includes anxiety, GERD, breast CA s/p B mastectomies, BCC of skin, partial thyroidectomy, h/o CVA, PAD with vascular ulcers L and R ankles, recent h/o postherpetic neuralgia affecting head and R eye, L IMN, and neck sx. ? ?  ?PT Comments  ? ? Physical Therapy Treatment completed this date. Patient tolerated session well and was agreeable to treatment. Upon arrival patient was supine with HOB slightly elevated complaining of back and leg pain at 5/10. Patient continues to require increased use of bed rails for bed mobility at supervision. EOB and dynamic standing therapeutic exercises were given for BLE strengthening (S2S, LAQ, standing marches) at supervision. Cueing required for proper hand placement when completing sit to stand transfers. Patient was able to walk to/from patient room door twice at CGA/SBA with RW with no LOB. Patient was left in bed with all needs met. Patient is progressing towards her goals and would continue to benefit from skilled physical therapy in order to optimize. Continue to recommend STR after acute hospitalization.   ?  ?Recommendations for follow up therapy are one component of a multi-disciplinary discharge planning process, led by the attending physician.  Recommendations may be updated based on patient status, additional functional criteria and insurance authorization. ? ?Follow Up Recommendations ? Skilled nursing-short term rehab (<3 hours/day) ?  ?  ?Assistance Recommended at Discharge Frequent or constant Supervision/Assistance   ?Patient can return home with the following A lot of help with walking and/or transfers;A lot of help with bathing/dressing/bathroom;Assistance with cooking/housework;Assist for transportation ?  ?Equipment Recommendations ? Rolling walker (2 wheels);BSC/3in1  ?  ?Recommendations for Other Services   ? ? ?  ?Precautions / Restrictions Precautions ?Precautions: Fall ?Restrictions ?Weight Bearing Restrictions: No  ?  ? ?Mobility ? Bed Mobility ?Overal bed mobility: Needs Assistance ?Bed Mobility: Sit to Supine, Supine to Sit ?  ?  ?Supine to sit: Supervision ?Sit to supine: Supervision ?  ?General bed mobility comments: performed supine<> sit without assistance. Required use of bed rail to complete ?Patient Response: Cooperative ? ?Transfers ?Overall transfer level: Needs assistance ?Equipment used: Rolling walker (2 wheels) ?Transfers: Sit to/from Stand ?Sit to Stand: Supervision ?  ?  ?  ?  ?  ?  ?  ? ?Ambulation/Gait ?Ambulation/Gait assistance: Min guard (SBA) ?Gait Distance (Feet): 40 Feet ?Assistive device: Rolling walker (2 wheels) ?Gait Pattern/deviations: Shuffle ?Gait velocity: decreased ?  ?  ?General Gait Details: decreased B LE step length/foot clearance ? ? ?Stairs ?  ?  ?  ?  ?  ? ? ?Wheelchair Mobility ?  ? ?Modified Rankin (Stroke Patients Only) ?  ? ? ?  ?Balance Overall balance assessment: Needs assistance ?Sitting-balance support: No upper extremity supported, Feet supported ?Sitting balance-Leahy Scale: Good ?Sitting balance - Comments: steady sitting reaching within BOS ?  ?Standing balance support: During functional activity, Bilateral upper extremity supported, Reliant on assistive device for balance ?Standing balance-Leahy Scale: Fair ?  ?  ?  ?  ?  ?  ?  ?  ?  ?  ?  ?  ?  ? ?  ?  Cognition Arousal/Alertness: Awake/alert ?Behavior During Therapy: [facility] for tasks assessed/performed ?Overall Cognitive Status: Impaired/Different from baseline ?  ?  ?  ?  ?  ?  ?  ?  ?  ?  ?  ?  ?  ?  ?  ?  ?General  Comments: Alert and oriented to self, place. ?  ?  ? ?  ?Exercises General Exercises - Lower Extremity ?Long Arc Quad: AROM, Strengthening, Both, 10 reps, Seated ?Hip Flexion/Marching: Standing, AROM, 10 reps (for strenghening and balance with RW) ?Other Exercises ?Other Exercises: x5 sit to stands from EOB with RW ? ?  ?General Comments General comments (skin integrity, edema, etc.): HR ranged from 75-125bpm throughout session, SpO2 remained >90% on room air ?  ?  ? ?Pertinent Vitals/Pain Pain Assessment ?Pain Assessment: 0-10 ?Pain Score: 4  ?Faces Pain Scale: Hurts little more ?Pain Location: back and legs ?Pain Descriptors / Indicators: Aching ?Pain Intervention(s): Monitored during session, Repositioned, Patient requesting pain meds-RN notified  ? ? ?Home Living   ?  ?  ?  ?  ?  ?  ?  ?  ?  ?   ?  ?Prior Function    ?  ?  ?   ? ?PT Goals (current goals can now be found in the care plan section) Acute Rehab PT Goals ?Patient Stated Goal: to go home ?PT Goal Formulation: With patient ?Time For Goal Achievement: 10/16/21 ?Potential to Achieve Goals: Fair ?Progress towards PT goals: Progressing toward goals ? ?  ?Frequency ? ? ? Min 2X/week ? ? ? ?  ?PT Plan Current plan remains appropriate  ? ? ?Co-evaluation   ?  ?  ?  ?  ? ?  ?AM-PAC PT "6 Clicks" Mobility   ?Outcome Measure ? Help needed turning from your back to your side while in a flat bed without using bedrails?: None ?Help needed moving from lying on your back to sitting on the side of a flat bed without using bedrails?: A Lot ?Help needed moving to and from a bed to a chair (including a wheelchair)?: A Little ?Help needed standing up from a chair using your arms (e.g., wheelchair or bedside chair)?: A Little ?Help needed to walk in hospital room?: A Little ?Help needed climbing 3-5 steps with a railing? : A Lot ?6 Click Score: 17 ? ?  ?End of Session Equipment Utilized During Treatment: Gait belt ?Activity Tolerance: Patient limited by fatigue ?Patient  left: in bed;with call bell/phone within reach;with chair alarm set;with bed alarm set ?Nurse Communication: Mobility status;Patient requests pain meds ?PT Visit Diagnosis: Unsteadiness on feet (R26.81);Other abnormalities of gait and mobility (R26.89);Muscle weakness (generalized) (M62.81);History of falling (Z91.81) ?  ? ? ?Time: 6295-2841 ?PT Time Calculation (min) (ACUTE ONLY): 20 min ? ?Charges:  $Gait Training: 8-22 mins          ?          ? ?Iva Boop, PT  ?10/06/21. 11:04 AM ? ? ?

## 2021-10-06 NOTE — Progress Notes (Signed)
OT Cancellation Note ? ?Patient Details ?Name: [name] ?MRN: [id number] ?DOB: Nov 17, 1927 ? ? ?Cancelled Treatment:    Reason Eval/Treat Not Completed: Patient declined, no reason specified;Pain limiting ability to participate. Pt received lying in bed, endorsing 7/10 headache and declines OT participation. Pt requesting OT notify RN. OT assisted pt in repositioning in bed for improved comfort and turned down shades to allow for pt to take a nap, per her request. RN notified of pain promptly after leaving pt's room. Will re-attempt OT tx at later date/time as schedule permits.  ? ?Ardeth Perfect., MPH, MS, OTR/L ?ascom [phone] ?10/06/21, 4:32 PM ?

## 2021-10-06 NOTE — Progress Notes (Signed)
[facility] Progress Note ? ?Patient: [name]    JOA:[id number]  DOA: 10/01/2021    ?Date of Service: the patient was seen and examined on 10/06/2021 ? ?Brief hospital course: ?88 year old female with past medical history of severe protein calorie malnutrition, anxiety disorder and chronic pain from postherpetic neuralgia and peripheral arterial disease who goes to the wound care center who reportedly had COVID back in November and brought in on 3/26 for cough and loss of balance.  Patient found to be mildly hypoxic with a large right pleural effusion and COVID-positive.  Patient also found to be hypokalemic as well as hemoglobinuria.  Admitted to the hospitalist service. ?  ?On morning of 3/27, patient had episode of persistent SVT requiring 1 dose of IV Lopressor which resolved the issue.  That afternoon, interventional radiology performed right-sided thoracentesis removing 450 cc of fluid.  On early morning of 3/28, patient complaining of some chest pain.  Troponins noted minimal elevation.  Cardiology reviewed case, but felt that given her advanced age and minimal change in troponin, no intervention recommended at this time.  Patient seen by palliative care, and plans are for family meeting. ? ?Assessment and Plan: ?Assessment and Plan: ?Pleural effusion ?Unclear etiology for cause.?  COVID.  Status post thoracentesis 3/27. Exudative by protein (LDH not obtained). Culture negative. Clinically improved, weaned off o2. Will repeat CXR on 3/29 does not how reaccumulation. Will plan to monitor clinically. ? ?SVT (supraventricular tachycardia) ([place]) ?Following admission, patient went into episodes of SVT with a heart rate as high as 160s.  Likely set off by hypokalemia and large pleural effusion.  Gave 1 dose of IV Lopressor which seem to have resolved situation.  IV Lopressor made as needed. ? ?COVID-19 virus infection ?Patient reportedly has had symptoms for 2 weeks.  Also reportedly had COVID 4 months  ago.  Crp remains elevated. ? ?Hyperkalemia ?Iatrogenic, resolved with stopping potassium supplement ? ?Protein-calorie malnutrition, severe ?BMI only 14.  High mortality risk. ? ?Nutrition Status: ?Nutrition Problem: Increased nutrient needs ?Etiology: acute illness ?Signs/Symptoms: meal completion < 25% ?Interventions: Ensure Enlive (each supplement provides 350kcal and 20 grams of protein), MVI, Magic cup ? ?Debility ?Hospice eligible. Plan for SNF d/c, given covid earliest d/c date would be 4/6, to [facility] ? ?Chronic diastolic CHF (congestive heart failure) ([place]) ?Grade 1 diastolic dysfunction seen on echocardiogram in December 2021.  Elevated BNP.  Does not appear fluid overloaded. Will monitor ? ?Hemoglobinuria ?Noted hemoglobinuria without too many red cells.  Normal CK level.  Could be secondary to prolonged immobilization versus COVID.  Treated w/ IVF which have been discontinued ? ?PAD (peripheral artery disease) ([place]) ?Continue wound care for vascular wounds.  Continue home pain regimen. ? ?Anxiety disorder ?Continue benzos ? ?Narcotic dependence ([place]) ?Patient on chronic MSIR for her postherpetic neuralgia and arterial disease wounds.  Continue. ? ?Postherpetic neuralgia ?Continue Topamax and MSIR. Is on daily acyclovir from PCP for what they described as "chronic hsv infection" ? ?HTN ?Wnl today ?- cont amlod ? ? ?Body mass index is 16.58 kg/m?[name]  ?Nutrition Problem: Severe Malnutrition ?Etiology: chronic illness (COVID+, PAD with chronic vascular ulcers, advanced age) ?Pressure Injury Ankle Anterior;Right (Active)  ?   ?Location: Ankle  ?Location Orientation: Anterior;Right  ?Staging:   ?Wound Description (Comments):   ?Present on Admission:   ?  ? ?Consultants: ?Interventional radiology ?Palliative care ? ?Procedures: ?Status post thoracentesis of right lung done 3/27: 450 cc of fluid removed. ? ?Antimicrobials: ?None ? ?Code Status:  Full code ? ? ?Subjective: occasional cough. Chronic foot  pain present. Ate a small amount of lunch ? ?Objective: ?Noted elevated blood pressures ?Vitals:  ? 10/06/21 0523 10/06/21 0815  ?BP: (!) 136/57 130/60  ?Pulse: 96 89  ?Resp: 16 20  ?Temp: 99 ?F (37.2 ?C) 97.9 ?F (36.6 ?C)  ?SpO2: 91% 93%  ? ? ?Intake/Output Summary (Last 24 hours) at 10/06/2021 1219 ?Last data filed at 10/06/2021 [phone] ?Gross per 24 hour  ?Intake --  ?Output 600 ml  ?Net -600 ml  ? ?Filed Weights  ? 10/01/21 1029 10/05/21 1024  ?Weight: 38.6 kg 38.5 kg  ? ?Body mass index is 16.58 kg/m?. ? ?Exam: ? ?General: Oriented x2, mild distress secondary to pain ?HEENT: Emaciated. Mucous membranes are dry ?Cardiovascular: Regular rate and rhythm, borderline tachycardia ?Respiratory: Poor inspiratory effort.  Scattered rhonchi ?Abdomen: Soft, nontender, nondistended, hypoactive bowel sounds ?Musculoskeletal: No clubbing or cyanosis or edema. Decreased tone throughout ?Skin: Noted peripheral ulcers LEs ?Psychiatry: calm ?Neurology: moving all 4 extremities ? ? ?Disposition:  ?Status is: Inpatient ?Remains inpatient appropriate because: covid quarantine ?  ? ?Anticipated discharge date: 4/6 ? ? ? ?Family Communication: Updated son by phone 3/30. No answer when called today ?DVT Prophylaxis: ?enoxaparin (LOVENOX) injection 30 mg Start: 10/03/21 2200   ? ? ? ?Author: ?Desma Maxim ,MD ?10/06/2021 12:19 PM ? ?To reach On-call, see care teams to locate the attending and reach out via [email or web address]. ?Between 7PM-7AM, please contact night-coverage ?If you still have difficulty reaching the attending provider, please page the [facility] (Director on Call) for [facility] on amion for assistance. ? ?

## 2021-10-06 NOTE — TOC Progression Note (Signed)
Transition of Care (TOC) - Progression Note  ? ? ?Patient Details  ?Name: [name] ?MRN: [id number] ?Date of Birth: Dec 05, 1927 ? ?Transition of Care (TOC) CM/SW Contact  ?Taniyah Ballow E Kimbly Eanes, LCSW ?Phone Number: ?10/06/2021, 12:31 PM ? ?Clinical Narrative:    ?Magda Paganini confirmed patient can come back to [facility] on 4/6.  ?Magda Paganini confirmed they do not do HTA auth. TOC to start auth around 4/4. ? ? ?Expected Discharge Plan: [place] ?Barriers to Discharge: Continued Medical Work up ? ?Expected Discharge Plan and Services ?Expected Discharge Plan: [place] ?  ?Discharge Planning Services: CM Consult ?Post Acute Care Choice: [place] ?Living arrangements for the past 2 months: Apartment ?                ?  ?  ?  ?  ?  ?  ?  ?  ?  ?  ? ? ?Social Determinants of Health (SDOH) Interventions ?  ? ?Readmission Risk Interventions ?   ? View : No data to display.  ?  ?  ?  ? ? ?

## 2021-10-06 NOTE — Plan of Care (Signed)

## 2021-10-07 DIAGNOSIS — F419 Anxiety disorder, unspecified: Secondary | ICD-10-CM

## 2021-10-07 DIAGNOSIS — F1320 Sedative, hypnotic or anxiolytic dependence, uncomplicated: Secondary | ICD-10-CM

## 2021-10-07 DIAGNOSIS — I5032 Chronic diastolic (congestive) heart failure: Secondary | ICD-10-CM | POA: Diagnosis not present

## 2021-10-07 DIAGNOSIS — I10 Essential (primary) hypertension: Secondary | ICD-10-CM

## 2021-10-07 DIAGNOSIS — J90 Pleural effusion, not elsewhere classified: Secondary | ICD-10-CM | POA: Diagnosis not present

## 2021-10-07 DIAGNOSIS — R823 Hemoglobinuria: Secondary | ICD-10-CM

## 2021-10-07 DIAGNOSIS — E43 Unspecified severe protein-calorie malnutrition: Secondary | ICD-10-CM

## 2021-10-07 DIAGNOSIS — I471 Supraventricular tachycardia: Secondary | ICD-10-CM

## 2021-10-07 DIAGNOSIS — I739 Peripheral vascular disease, unspecified: Secondary | ICD-10-CM

## 2021-10-07 NOTE — Plan of Care (Signed)

## 2021-10-07 NOTE — Progress Notes (Signed)
?PROGRESS NOTE ? ? ? ?[name]  ZOX:[id number] DOB: May 11, 1928 DOA: 10/01/2021 ?PCP: Venia Carbon, MD  ? ?Brief Narrative:  ?88 year old female with past medical history of severe protein calorie malnutrition, anxiety disorder and chronic pain from postherpetic neuralgia and peripheral arterial disease who goes to the wound care center who reportedly had COVID back in November and brought in on 3/26 for cough and loss of balance.  Patient found to be mildly hypoxic with a large right pleural effusion and COVID-positive.  Patient also found to be hypokalemic as well as hemoglobinuria.  Admitted to the hospitalist service. ?  ?On morning of 3/27, patient had episode of persistent SVT requiring 1 dose of IV Lopressor which resolved the issue.  That afternoon, interventional radiology performed right-sided thoracentesis removing 450 cc of fluid.  On early morning of 3/28, patient complaining of some chest pain.  Troponins noted minimal elevation.  Cardiology reviewed case, but felt that given her advanced age and minimal change in troponin, no intervention recommended at this time.  Patient seen by palliative care, and plans are for family meeting. ? ? ?Assessment & Plan: ?  ?Principal Problem: ?  Pleural effusion ?Active Problems: ?  COVID-19 virus infection ?  SVT (supraventricular tachycardia) ([place]) ?  Hypokalemia ?  Protein-calorie malnutrition, severe ?  Chronic diastolic CHF (congestive heart failure) ([place]) ?  Hemoglobinuria ?  Hypertension ?  PAD (peripheral artery disease) ([place]) ?  Benzodiazepine dependence ([place]) ?  Anxiety disorder ?  Postherpetic neuralgia ?  Narcotic dependence ([place]) ?  Neuropathy ([place]) ? ? ?Pleural effusion ?Unclear etiology for cause.?  COVID.  Status post thoracentesis 3/27. Exudative by protein (LDH not obtained). Culture negative. Clinically improved, weaned off o2. Will repeat CXR on 3/29 does not how reaccumulation. Will continue to monitor clinically. ?  ?SVT  (supraventricular tachycardia) ([place]) ?Following admission, patient went into episodes of SVT with a heart rate as high as 160s.  Likely set off by hypokalemia and large pleural effusion.  Has received 1 dose of IV Lopressor which resolved situation.  IV Lopressor made as needed. ?  ?COVID-19 virus infection ?Patient reportedly has had symptoms for 2 weeks.  Also reportedly had COVID 4 months ago.  Crp remains elevated. ?  ?Hyperkalemia ?Iatrogenic, resolved with stopping potassium supplement ?Recheck bmp in AM ?  ?Protein-calorie malnutrition, severe ?BMI only 16  High mortality risk. ?  ?Nutrition Status: ?Nutrition Problem: Increased nutrient needs ?Etiology: acute illness ?Signs/Symptoms: meal completion < 25% ?Interventions: Ensure Enlive (each supplement provides 350kcal and 20 grams of protein), MVI, Magic cup ?  ?Debility ?Hospice eligible. Plan for SNF d/c, given covid earliest d/c date would be 4/6, to [facility] ?  ?Chronic diastolic CHF (congestive heart failure) ([place]) ?Grade 1 diastolic dysfunction seen on echocardiogram in December 2021.  Elevated BNP.  Does not appear fluid overloaded. Will monitor ?  ?Hemoglobinuria ?Noted hemoglobinuria without too many red cells.  Normal CK level.  Could be secondary to prolonged immobilization versus COVID.  Treated w/ IVF which have been discontinued ?  ?PAD (peripheral artery disease) ([place]) ?Continue wound care for vascular wounds.  Continue home pain regimen. ?  ?Anxiety disorder ?Continue benzos ?  ?Narcotic dependence ([place]) ?Patient on chronic MSIR for her postherpetic neuralgia and arterial disease wounds.  Continue. ?  ?Postherpetic neuralgia ?Continue Topamax and MSIR. Is on daily acyclovir from PCP for what they described as "chronic hsv infection" ?  ?HTN ?Wnl today ?- cont amlod ?  ?  ?  Body mass index is 16.58 kg/m?[name]  ?Nutrition Problem: Severe Malnutrition ?Etiology: chronic illness (COVID+, PAD with chronic vascular ulcers, advanced age) ?Pressure  Injury Ankle Anterior;Right (Active)  ?   ?Location: Ankle  ?Location Orientation: Anterior;Right  ?Staging:   ?Wound Description (Comments):   ?Present on Admission:   ? ? ?DVT prophylaxis: Lovenox SQ  ?Code Status: DNR ? ?  ?Code Status Orders  ?(From admission, onward)  ?  ? ? ?  ? ?  Start     Ordered  ? 10/04/21 1140  Do not attempt resuscitation (DNR)  Continuous       ?Question Answer Comment  ?In the event of cardiac or respiratory ARREST Do not call a ?code blue?   ?In the event of cardiac or respiratory ARREST Do not perform Intubation, CPR, defibrillation or ACLS   ?In the event of cardiac or respiratory ARREST Use medication by any route, position, wound care, and other measures to relive pain and suffering. May use oxygen, suction and manual treatment of airway obstruction as needed for comfort.   ?Comments MOSt form in [place]   ?  ? 10/04/21 1139  ? ?  ?  ? ?  ? ?Code Status History   ? ? Date Active Date Inactive Code Status Order ID Comments User Context  ? 10/01/2021 2027 10/04/2021 1139 Full Code [id number]  Sid Falcon, MD Inpatient  ? 06/18/2020 1317 06/22/2020 2132 Full Code [id number]  Collier Bullock, MD ED  ? ?  ? ?Advance Directive Documentation   ? ?Flowsheet Row Most Recent Value  ?Type of Advance Directive Living will  ?Pre-existing out of facility DNR order (yellow form or pink MOST form) --  ?"MOST" Form in Place? --  ? ?  ? ?Family Communication: Tried calling son Jori Moll, no answer  ?Disposition Plan:   Remains inpatient appropriate because: covid quarantine ?Consults called:  none today ?Admission status: Inpatient ? ? ?Consultants:  ?IR, PALLIATIVE ? ?Procedures:  ?CT HEAD WO CONTRAST (5MM) ? ?Result Date: 10/01/2021 ?CLINICAL DATA:  Altered mental status EXAM: CT HEAD WITHOUT CONTRAST TECHNIQUE: Contiguous axial images were obtained from the base of the skull through the vertex without intravenous contrast. RADIATION DOSE REDUCTION: This exam was performed according to the  departmental dose-optimization program which includes automated exposure control, adjustment of the mA and/or kV according to patient size and/or use of iterative reconstruction technique. COMPARISON:  CT head 10/18/2020 FINDINGS: Brain: No acute intracranial hemorrhage, mass effect, or herniation. No extra-axial fluid collections. No evidence of acute territorial infarct. No hydrocephalus. Moderate cortical volume loss. Patchy hypodensities throughout the periventricular and subcortical white matter, likely secondary to chronic microvascular ischemic changes. Area of encephalomalacia in the right MCA territory secondary to old infarct. Vascular: Calcified plaques in the carotid siphons. Skull: Normal. Negative for fracture or focal lesion. Sinuses/Orbits: Mild-to-moderate chronic appearing mucosal thickening in the maxillary and ethmoid sinuses. Mild mucosal thickening in the sphenoid sinuses. Moderate opacification of the left mastoid air cells. Other: None. IMPRESSION: 1. No acute intracranial process identified. Chronic changes as described. 2. Moderate opacification in the left mastoid air cells. Electronically Signed   By: Ofilia Neas M.D.   On: 10/01/2021 13:41  ? ?CT Chest Wo Contrast ? ?Result Date: 10/01/2021 ?CLINICAL DATA:  Fall with left flank pain, concern for rib fracture. EXAM: CT CHEST WITHOUT CONTRAST TECHNIQUE: Multidetector CT imaging of the chest was performed following the standard protocol without IV contrast. RADIATION DOSE REDUCTION: This exam was performed according to the  departmental dose-optimization program which includes automated exposure control, adjustment of the mA and/or kV according to patient size and/or use of iterative reconstruction technique. COMPARISON:  Chest radiograph dated 10/01/2021 and CT chest dated 12/09/2006. FINDINGS: Cardiovascular: Vascular calcifications are seen in the coronary arteries and aortic arch. The heart is normal in size. No pericardial  effusion. Mediastinum/Nodes: No enlarged mediastinal or axillary lymph nodes. 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 The trachea and esophagus demonstrate no significant findings. Lungs/Pleura: There is a moderate to large right and trace left

## 2021-10-08 DIAGNOSIS — F419 Anxiety disorder, unspecified: Secondary | ICD-10-CM | POA: Diagnosis not present

## 2021-10-08 DIAGNOSIS — F1320 Sedative, hypnotic or anxiolytic dependence, uncomplicated: Secondary | ICD-10-CM | POA: Diagnosis not present

## 2021-10-08 DIAGNOSIS — I5032 Chronic diastolic (congestive) heart failure: Secondary | ICD-10-CM | POA: Diagnosis not present

## 2021-10-08 DIAGNOSIS — J90 Pleural effusion, not elsewhere classified: Secondary | ICD-10-CM | POA: Diagnosis not present

## 2021-10-08 NOTE — Progress Notes (Signed)
Occupational Therapy Treatment ?Patient Details ?Name: [name] ?MRN: [id number] ?DOB: 1927-11-13 ?Today's Date: 10/08/2021 ? ? ?History of present illness Pt is a 88 y.o. female presenting to hospital 3/26 with c/o weakness, cough, and AMS.  Pt slid and hit wall with L sided rib cage while upright day before hospital presentation.  (+) COVID-19.  Pt admitted with pleural effusion, COVID-19 (+), hypokalemia, htn, and protein-calorie malnutrition.  PMH includes anxiety, GERD, breast CA s/p B mastectomies, BCC of skin, partial thyroidectomy, h/o CVA, PAD with vascular ulcers L and R ankles, recent h/o postherpetic neuralgia affecting head and R eye, L IMN, and neck sx. ?  ?OT comments ? Chart reviewed, RN cleared pt for participation in OT tx session. Tx session targeted progressing functional mobility and independence during Adl task completion. Pt performed uspine<>sit with MIN A, STS 4x with MIN A with RW, sustained standing for up to 30 seconds, marching in place with RW in preparation for standing ADL tasks with CGA, UB dressing with MIN A in seated, grooming tasks with SET UP. Pt is making steady progress towards goals set fourth.  Pt declined further functional ambulation or transfer to chair despite education re: importance of OOB mobility. Pt is left as received, NAD, all needs met. Vitals stable throughout. OT will continue to follow acutely.   ? ?Recommendations for follow up therapy are one component of a multi-disciplinary discharge planning process, led by the attending physician.  Recommendations may be updated based on patient status, additional functional criteria and insurance authorization. ?   ?Follow Up Recommendations ? Skilled nursing-short term rehab (<3 hours/day)  ?  ?Assistance Recommended at Discharge Frequent or constant Supervision/Assistance  ?Patient can return home with the following ? A little help with walking and/or transfers;A little help with bathing/dressing/bathroom;Assistance  with cooking/housework ?  ?Equipment Recommendations ? Other (comment) (per next venue of care)  ?  ?Recommendations for Other Services   ? ?  ?Precautions / Restrictions Precautions ?Precautions: Fall ?Restrictions ?Weight Bearing Restrictions: No  ? ? ?  ? ? ?ADL either performed or assessed with clinical judgement  ? ?ADL Overall ADL's : Needs assistance/impaired ?  ?  ?  ?  ?  ?  ?  ?  ?  ?  ?  ?  ?  ?  ?  ?  ?  ?  ?  ?General ADL Comments: supine<>sit with MIN A, STS 4x with MIN A with RW, sustained standing for up to 30 seconds, marching in place with RW in preparation for standing ADL tasks with CGA, UB dressing with MIN A in seated, grooming tasks with SET UP ?  ? ?Extremity/Trunk Assessment   ?  ?  ?  ?  ?  ? ?Vision   ?  ?  ?Perception   ?  ?Praxis   ?  ? ?Cognition Arousal/Alertness: Awake/alert ?Behavior During Therapy: [facility] for tasks assessed/performed ?Overall Cognitive Status: No family/caregiver present to determine baseline cognitive functioning ?  ?  ?  ?  ?  ?  ?  ?  ?  ?  ?  ?  ?  ?  ?  ?  ?General Comments: oriented to self, place, and grossly to situation ?  ?  ?   ?Exercises Other Exercises ?Other Exercises: edu: role of OT, importance of mobility ? ?  ?Shoulder Instructions   ? ? ?  ?General Comments    ? ? ?Pertinent Vitals/ Pain       Pain Assessment ?  Pain Assessment: Faces ?Faces Pain Scale: Hurts a little bit ?Pain Location: L hip ?Pain Descriptors / Indicators: Aching ?Pain Intervention(s): Limited activity within patient's tolerance, Monitored during session, Repositioned ? ?Home Living   ?  ?  ?  ?  ?  ?  ?  ?  ?  ?  ?  ?  ?  ?  ?  ?  ?  ?  ? ?  ?Prior Functioning/Environment    ?  ?  ?  ?   ? ?Frequency ? Min 2X/week  ? ? ? ? ?  ?Progress Toward Goals ? ?OT Goals(current goals can now be found in the care plan section) ? Progress towards OT goals: Progressing toward goals ? ?Acute Rehab OT Goals ?Patient Stated Goal: get stronger ?OT Goal Formulation: With patient ?Time For Goal  Achievement: 10/22/21 ?Potential to Achieve Goals: Downs  ?Plan Discharge plan remains appropriate;Frequency remains appropriate   ? ?Co-evaluation ? ? ?   ?  ?  ?  ?  ? ?  ?AM-PAC OT "6 Clicks" Daily Activity     ?Outcome Measure ? ? Help from another person eating meals?: None ?Help from another person taking care of personal grooming?: A Little ?Help from another person toileting, which includes using toliet, bedpan, or urinal?: A Lot ?Help from another person bathing (including washing, rinsing, drying)?: A Lot ?Help from another person to put on and taking off regular upper body clothing?: A Little ?Help from another person to put on and taking off regular lower body clothing?: A Lot ?6 Click Score: 16 ? ?  ?End of Session Equipment Utilized During Treatment: Rolling walker (2 wheels);Gait belt ? ?OT Visit Diagnosis: Unsteadiness on feet (R26.81);History of falling (Z91.81);Muscle weakness (generalized) (M62.81) ?  ?Activity Tolerance Patient tolerated treatment well ?  ?Patient Left in bed;with call bell/phone within reach;with bed alarm set ?  ?Nurse Communication Mobility status ?  ? ?   ? ?Time: 7493-5521 ?OT Time Calculation (min): 20 min ? ?Charges: OT General Charges ?$OT Visit: 1 Visit ?OT Treatments ?$Self Care/Home Management : 8-22 mins ? ?Shanon Payor, OTD OTR/L  ?10/08/21, 5:16 PM  ?

## 2021-10-08 NOTE — Plan of Care (Signed)

## 2021-10-08 NOTE — Progress Notes (Signed)
?PROGRESS NOTE ? ? ? ?[name]  FMB:[id number] DOB: 1927/08/27 DOA: 10/01/2021 ?PCP: Venia Carbon, MD  ? ?Brief Narrative:  ?88 year old female with past medical history of severe protein calorie malnutrition, anxiety disorder and chronic pain from postherpetic neuralgia and peripheral arterial disease who goes to the wound care center who reportedly had COVID back in November and brought in on 3/26 for cough and loss of balance.  Patient found to be mildly hypoxic with a large right pleural effusion and COVID-positive.  Patient also found to be hypokalemic as well as hemoglobinuria.  Admitted to the hospitalist service. ?  ?On morning of 3/27, patient had episode of persistent SVT requiring 1 dose of IV Lopressor which resolved the issue.  That afternoon, interventional radiology performed right-sided thoracentesis removing 450 cc of fluid.  On early morning of 3/28, patient complaining of some chest pain.  Troponins noted minimal elevation.  Cardiology reviewed case, but felt that given her advanced age and minimal change in troponin, no intervention recommended at this time.  Patient seen by palliative care, with plans are for family meeting. ? ? ?Assessment & Plan: ?  ?Principal Problem: ?  Pleural effusion ?Active Problems: ?  COVID-19 virus infection ?  SVT (supraventricular tachycardia) ([place]) ?  Hypokalemia ?  Protein-calorie malnutrition, severe ?  Chronic diastolic CHF (congestive heart failure) ([place]) ?  Hemoglobinuria ?  Hypertension ?  PAD (peripheral artery disease) ([place]) ?  Benzodiazepine dependence ([place]) ?  Anxiety disorder ?  Postherpetic neuralgia ?  Narcotic dependence ([place]) ?  Neuropathy ([place]) ? ? ?Pleural effusion ?Unclear etiology for cause.?  COVID.  Status post thoracentesis 3/27. Exudative by protein (LDH not obtained). Culture negative. Clinically improved, weaned off o2. Repeat CXR on 3/29 does not show reaccumulation.  ?Will continue to monitor clinically. ?  ?SVT (supraventricular  tachycardia) ([place]) ?Following admission, patient went into episodes of SVT with a heart rate as high as 160s.  Likely set off by hypokalemia and large pleural effusion.  Has received 1 dose of IV Lopressor which resolved situation.  IV Lopressor made as needed. ?  ?COVID-19 virus infection ?Patient reportedly has had symptoms for 2 weeks.  Also reportedly had COVID 4 months ago.  ?  ?Hyperkalemia ?Iatrogenic, resolved with stopping potassium supplement ?Recheck bmp in AM ?  ?Protein-calorie malnutrition, severe ?BMI only 16  High mortality risk. ?  ?Nutrition Status: ?Nutrition Problem: Increased nutrient needs ?Etiology: acute illness ?Signs/Symptoms: meal completion < 25% ?Interventions: Ensure Enlive (each supplement provides 350kcal and 20 grams of protein), MVI, Magic cup ?  ?Debility ?Hospice eligible. Plan for SNF d/c, given covid earliest d/c date would be 4/6, to [facility] ?  ?Chronic diastolic CHF (congestive heart failure) ([place]) ?Grade 1 diastolic dysfunction seen on echocardiogram in December 2021.  Elevated BNP.  Does not appear fluid overloaded. Will monitor ?  ?Hemoglobinuria ?Noted hemoglobinuria without too many red cells.  Normal CK level.  Could be secondary to prolonged immobilization versus COVID.  Treated w/ IVF which have been discontinued ?  ?PAD (peripheral artery disease) ([place]) ?Continue wound care for vascular wounds.  Continue home pain regimen. ?  ?Anxiety disorder ?Continue benzos ?  ?Narcotic dependence ([place]) ?Patient on chronic MSIR for her postherpetic neuralgia and arterial disease wounds.  Continue. ?  ?Postherpetic neuralgia ?Continue Topamax and MSIR. Is on daily acyclovir from PCP for what they described as "chronic hsv infection" ?  ?HTN ?- cont amlod ?  ?  ?Body mass index is 16.58  kg/m?.  ?Nutrition Problem: Severe Malnutrition ?Etiology: chronic illness (COVID+, PAD with chronic vascular ulcers, advanced age) ?Pressure Injury Ankle Anterior;Right (Active)  ?    ?Location: Ankle  ?Location Orientation: Anterior;Right  ?Staging:   ?Wound Description (Comments):   ?Present on Admission:   ?  ?  ?DVT prophylaxis: Lovenox SQ  ?Code Status: DNR ? ? ?  ?Code Status Orders  ?(From admission, onward)  ?  ? ? ?  ? ?  Start     Ordered  ? 10/04/21 1140  Do not attempt resuscitation (DNR)  Continuous       ?Question Answer Comment  ?In the event of cardiac or respiratory ARREST Do not call a ?code blue?   ?In the event of cardiac or respiratory ARREST Do not perform Intubation, CPR, defibrillation or ACLS   ?In the event of cardiac or respiratory ARREST Use medication by any route, position, wound care, and other measures to relive pain and suffering. May use oxygen, suction and manual treatment of airway obstruction as needed for comfort.   ?Comments MOSt form in [place]   ?  ? 10/04/21 1139  ? ?  ?  ? ?  ? ?Code Status History   ? ? Date Active Date Inactive Code Status Order ID Comments User Context  ? 10/01/2021 2027 10/04/2021 1139 Full Code [id number]  Sid Falcon, MD Inpatient  ? 06/18/2020 1317 06/22/2020 2132 Full Code [id number]  Collier Bullock, MD ED  ? ?  ? ?Advance Directive Documentation   ? ?Flowsheet Row Most Recent Value  ?Type of Advance Directive Living will  ?Pre-existing out of facility DNR order (yellow form or pink MOST form) --  ?"MOST" Form in Place? --  ? ?  ? ?Family Communication: Discussed with son after he called back Saturday, no new info  ?Disposition Plan:   remains inpt appropriate because of covid quarantine ?Consults called: None ?Admission status: Inpatient ? ? ?Consultants:  ?Interventional radiology, palliative care ? ?Procedures:  ?CT HEAD WO CONTRAST (5MM) ? ?Result Date: 10/01/2021 ?CLINICAL DATA:  Altered mental status EXAM: CT HEAD WITHOUT CONTRAST TECHNIQUE: Contiguous axial images were obtained from the base of the skull through the vertex without intravenous contrast. RADIATION DOSE REDUCTION: This exam was performed according to the  departmental dose-optimization program which includes automated exposure control, adjustment of the mA and/or kV according to patient size and/or use of iterative reconstruction technique. COMPARISON:  CT head 10/18/2020 FINDINGS: Brain: No acute intracranial hemorrhage, mass effect, or herniation. No extra-axial fluid collections. No evidence of acute territorial infarct. No hydrocephalus. Moderate cortical volume loss. Patchy hypodensities throughout the periventricular and subcortical white matter, likely secondary to chronic microvascular ischemic changes. Area of encephalomalacia in the right MCA territory secondary to old infarct. Vascular: Calcified plaques in the carotid siphons. Skull: Normal. Negative for fracture or focal lesion. Sinuses/Orbits: Mild-to-moderate chronic appearing mucosal thickening in the maxillary and ethmoid sinuses. Mild mucosal thickening in the sphenoid sinuses. Moderate opacification of the left mastoid air cells. Other: None. IMPRESSION: 1. No acute intracranial process identified. Chronic changes as described. 2. Moderate opacification in the left mastoid air cells. Electronically Signed   By: Ofilia Neas M.D.   On: 10/01/2021 13:41  ? ?CT Chest Wo Contrast ? ?Result Date: 10/01/2021 ?CLINICAL DATA:  Fall with left flank pain, concern for rib fracture. EXAM: CT CHEST WITHOUT CONTRAST TECHNIQUE: Multidetector CT imaging of the chest was performed following the standard protocol without IV contrast. RADIATION DOSE REDUCTION: This exam was  performed according to the departmental dose-optimization program which includes automated exposure control, adjustment of the mA and/or kV according to patient size and/or use of iterative reconstruction technique. COMPARISON:  Chest radiograph dated 10/01/2021 and CT chest dated 12/09/2006. FINDINGS: Cardiovascular: Vascular calcifications are seen in the coronary arteries and aortic arch. The heart is normal in size. No pericardial  effusion. Mediastinum/Nodes: No enlarged mediastinal or axillary lymph nodes. 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 The trachea and esophagus demonstrate no significant findings. Lungs/Pleura: There is a moderate to large right a

## 2021-10-09 ENCOUNTER — Telehealth: Payer: PPO

## 2021-10-09 DIAGNOSIS — J90 Pleural effusion, not elsewhere classified: Secondary | ICD-10-CM | POA: Diagnosis not present

## 2021-10-09 LAB — BASIC METABOLIC PANEL
Anion gap: 5 (ref 5–15)
BUN: 29 mg/dL — ABNORMAL HIGH (ref 8–23)
CO2: 25 mmol/L (ref 22–32)
Calcium: 8.2 mg/dL — ABNORMAL LOW (ref 8.9–10.3)
Chloride: 103 mmol/L (ref 98–111)
Creatinine, Ser: 0.88 mg/dL (ref 0.44–1.00)
GFR, Estimated: >60 mL/min (ref >60)
Glucose, Bld: 99 mg/dL (ref 70–99)
Potassium: 4.3 mmol/L (ref 3.5–5.1)
Sodium: 133 mmol/L — ABNORMAL LOW (ref 135–145)

## 2021-10-09 LAB — URINALYSIS, COMPLETE (UACMP) WITH MICROSCOPIC
Bacteria, UA: NONE SEEN
Bilirubin Urine: NEGATIVE
Glucose, UA: NEGATIVE mg/dL
Hgb urine dipstick: NEGATIVE
Ketones, ur: NEGATIVE mg/dL
Leukocytes,Ua: NEGATIVE
Nitrite: NEGATIVE
Protein, ur: NEGATIVE mg/dL
Specific Gravity, Urine: 1.013 (ref 1.005–1.030)
pH: 7 (ref 5.0–8.0)

## 2021-10-09 MED ORDER — ENSURE ENLIVE PO LIQD
237.0000 mL | Freq: Three times a day (TID) | ORAL | Status: DC
  Administered 2021-10-09 – 2021-10-12 (×8): 237 mL via ORAL

## 2021-10-09 MED ORDER — SODIUM CHLORIDE 0.9 % IV SOLN: INTRAVENOUS | Status: AC

## 2021-10-09 NOTE — Progress Notes (Signed)
?Progress Note ? ? ?Patient: [name] XBW:[id number] DOB: 08-Jan-1928 DOA: 10/01/2021     8 ?DOS: the patient was seen and examined on 10/09/2021 ?  ?Brief hospital course: ?88 year old female with past medical history of severe protein calorie malnutrition, anxiety disorder and chronic pain from postherpetic neuralgia and peripheral arterial disease who goes to the wound care center who reportedly had COVID back in November and brought in on 3/26 for cough and loss of balance.  Patient found to be mildly hypoxic with a large right pleural effusion and COVID-positive.  Patient also found to be hypokalemic as well as hemoglobinuria.  Admitted to the hospitalist service. ? ?On morning of 3/27, patient had episode of persistent SVT requiring 1 dose of IV Lopressor which resolved the issue.  That afternoon, interventional radiology performed right-sided thoracentesis removing 450 cc of fluid.  On early morning of 3/28, patient complaining of some chest pain.  Troponins noted minimal elevation.  Cardiology reviewed case, but felt that given her advanced age and minimal change in troponin, no intervention recommended at this time.  Patient seen by palliative care, and plan is to proceed with hospice placement if she declines. ? ?Currently stable-we will proceed for SNF. ? ?Patient was tested positive for COVID-19 on 10/01/2021 but CT value was 42.8 which means it is a prior infection.  Symptoms for the past couple of weeks and a prior positive in November 2022. ?Clinically there is no need for isolation and she should be able to go to rehab earlier. ?Current plan is to send her to rehab on 10/12/2021 after completing 10 days of quarantine as required by the rehab. ? ?4/3: Patient was complaining of some dysuria.  UA and urine culture ordered. ? ? ?Assessment and Plan: ?* Pleural effusion ?Unclear etiology for cause.?  COVID.  Status post thoracentesis.  Repeat chest x-ray 3/29 looking for any reaccumulation.  Shortness of  breath better following thoracentesis.  Weaned off of oxygen. ?-Continue to monitor. ? ?SVT (supraventricular tachycardia) ([place]) ?Following admission, patient went into episodes of SVT with a heart rate as high as 160s.  Likely set off by hypokalemia and large pleural effusion.  Gave 1 dose of IV Lopressor which seem to have resolved situation.  IV Lopressor made as needed. ?-Remains stable ? ?COVID-19 virus infection ?Patient reportedly has had symptoms for 2 weeks.  Also reportedly had COVID 4 months ago.  CRP checked on 3/28 was elevated at 5.2. ?CT value of positive COVID is 42.8 which makes it a prior infection. ?-No need for isolation anymore. ?-TOC to find out from facility if they can take her before, actual proposed date of discharge is 10/12/2021 ? ? ?Hypokalemia ?Resolved. ?-Continue to monitor and replete as needed ? ?Protein-calorie malnutrition, severe ?BMI only 16.  High mortality risk. ?Nutrition Status: ?Nutrition Problem: Increased nutrient needs ?Etiology: acute illness ?Signs/Symptoms: meal completion < 25% ?Interventions: Ensure Enlive (each supplement provides 350kcal and 20 grams of protein), MVI, Magic cup ? ?Palliative care consulted. ?Plan is to proceed with hospice care if she declines and discharged to SNF if remains stable, currently stable. ? ?Chronic diastolic CHF (congestive heart failure) ([place]) ?Grade 1 diastolic dysfunction seen on echocardiogram in December 2021.  Elevated BNP.  Patient did have a pleural effusion on admission. ?Clinically seems euvolemic. ?-Continue to monitor ? ?Hemoglobinuria ?Noted hemoglobinuria without too many red cells.  Normal CK level.  Could be secondary to prolonged immobilization versus COVID.  Patient received IV fluid which has been discontinued  now. ? ?PAD (peripheral artery disease) ([place]) ?Continue wound care for vascular wounds.  ?Continue home pain regimen. ? ?Hypertension ?- Continue amlodipine ? ?Anxiety disorder ?Continue benzos ? ?Narcotic  dependence ([place]) ?Patient on chronic MSIR for her postherpetic neuralgia and arterial disease wounds.  Continue. ? ?Postherpetic neuralgia ?Continue Topamax and MSIR ? ?  ? ?Subjective: Patient was seen and examined today.  She was complaining about some headache and dysuria, stating that it burns whenever I pee. ? ?Physical Exam: ?Vitals:  ? 10/08/21 1939 10/08/21 2123 10/09/21 0357 10/09/21 0756  ?BP: (!) 120/59 (!) 147/61 (!) 147/81 139/62  ?Pulse: (!) 58 81 80 77  ?Resp: '15  16 16  '$ ?Temp: 98.5 ?F (36.9 ?C)  98.5 ?F (36.9 ?C) (!) 97.3 ?F (36.3 ?C)  ?TempSrc:   Oral Oral  ?SpO2: 93%  94% 95%  ?Weight:      ?Height:      ? ?General.  Frail and cachectic elderly lady, in no acute distress. ?Pulmonary.  Lungs clear bilaterally, normal respiratory effort. ?CV.  Regular rate and rhythm, no JVD, rub or murmur. ?Abdomen.  Soft, nontender, nondistended, BS positive. ?CNS.  Alert and oriented to self.  No focal neurologic deficit. ?Extremities.  No edema, no cyanosis, pulses intact and symmetrical. ?Psychiatry.  Appears to have some cognitive impairment. ? ?Data Reviewed: ?Prior notes and labs reviewed. ? ?Family Communication: Discussed with son on phone. ? ?Disposition: ?Status is: Inpatient ?Remains inpatient appropriate because: Medically stable, completing quarantine.  Before going to SNF. ? ? Planned Discharge Destination: Skilled nursing facility ? ?DVT prophylaxis.  Lovenox ? ?Time spent: 45 minutes ? ?This record has been created using Systems analyst. Errors have been sought and corrected,but may not always be located. Such creation errors do not reflect on the standard of care. ? ?Author: ?Lorella Nimrod, MD ?10/09/2021 12:25 PM ? ?For on call review [email or web address].  ?

## 2021-10-09 NOTE — Assessment & Plan Note (Signed)
Patient was complaining of dysuria this morning.  No Foley ?-Check UA and urine culture. ?-Encourage p.o. hydration ?-Giving 1 L of IV fluid ?

## 2021-10-09 NOTE — Progress Notes (Signed)
Nutrition Follow-up ? ?DOCUMENTATION CODES:  ? ?Underweight, Severe malnutrition in context of chronic illness ? ?INTERVENTION:  ? ?-Increase Ensure Enlive po to TID, each supplement provides 350 kcal and 20 grams of protein.  ?-Continue Magic cup TID with meals, each supplement provides 290 kcal and 9 grams of protein  ?-Continue MVI with minerals daily ? ?NUTRITION DIAGNOSIS:  ? ?Severe Malnutrition related to chronic illness (COVID+, PAD with chronic vascular ulcers, advanced age) as evidenced by energy intake < or equal to 75% for > or equal to 1 month, severe fat depletion, severe muscle depletion. ? ?Ongoing ? ?GOAL:  ? ?Patient will meet greater than or equal to 90% of their needs ? ?Unmet ? ?MONITOR:  ? ?PO intake, Supplement acceptance, Labs, Weight trends, Skin ? ?REASON FOR ASSESSMENT:  ? ?Consult ?Assessment of nutrition requirement/status ? ?ASSESSMENT:  ? ?Pt admitted with cough x 2 weeks and loss of balance secondary to pleural effusion. Noted to be hypoxic on room air and COVID+. PMH significant for arthritis, breast CA, BCC of the skin, partial thyroidectomy, GERD, HLD, CVA, PAD with vascular ulcers on L/R ankles and recent h/o postherpetic neuralgia affecting head and R eye. ? ?3/27- s/p rt thoracentesis (450 ml removed) ? ?Reviewed I/O's: -500 ml x 24 hours and -3.5 L since admission ? ?UOP: 500 ml x 24 hours  ? ?Pt unavailable at time of visit. Attempted to speak with pt via call to hospital room phone, however, unable to reach.  ? ?Pt with very poor oral intake. Noted meal completions 5-10%. Pt is drinking Ensure supplements.  ? ?Per palliative care notes, pt does not desire feeding tube of IV fluids to supplement oral intake.  ? ?Per TOC notes, plan to d/c to SNF on 10/12/21 once pt is off COVID precautions.  ? ?Medications reviewed and include miralax.  ? ?Labs reviewed: Na: 133.   ? ?Diet Order:   ?Diet Order   ? ?       ?  DIET DYS 3 Room service appropriate? Yes; Fluid consistency: Thin  Diet  effective now       ?  ? ?  ?  ? ?  ? ? ?EDUCATION NEEDS:  ? ?No education needs have been identified at this time ? ?Skin:  Skin Assessment: Skin Integrity Issues: ?Skin Integrity Issues:: Other (Comment) ?Other: ulceration R medial; skin tear L elbow ? ?Last BM:  10/08/21 ? ?Height:  ? ?Ht Readings from Last 1 Encounters:  ?10/05/21 5' (1.524 m)  ? ? ?Weight:  ? ?Wt Readings from Last 1 Encounters:  ?10/05/21 38.5 kg  ? ? ?BMI:  Body mass index is 16.58 kg/m?. ? ?Estimated Nutritional Needs:  ? ?Kcal:  1200-1400 ? ?Protein:  60-75g ? ?Fluid:  >/=1.5L ? ? ? ?Loistine Chance, RD, LDN, CDCES ?Registered Dietitian II ?Certified Diabetes Care and Education Specialist ?Please refer to [facility] for RD and/or RD on-call/weekend/after hours pager  ?

## 2021-10-09 NOTE — TOC Progression Note (Signed)
Transition of Care (TOC) - Progression Note  ? ? ?Patient Details  ?Name: [name] ?MRN: [id number] ?Date of Birth: Nov 13, 1927 ? ?Transition of Care (TOC) CM/SW Contact  ?Sarena Jezek A Lesa Vandall, LCSW ?Phone Number: ?10/09/2021, 3:53 PM ? ?Clinical Narrative:   CSW received auth for pt to admit to SNF. #[id number]- pt has 5 days to admit. CSW called [facility] back and spoke with Colletta [place] and explained pt would not dc until 6th and they state that is fine because it is within the 5 day period. Pt also received EMS auth [id number]. ? ? ? ?Expected Discharge Plan: [place] ?Barriers to Discharge: Continued Medical Work up ? ?Expected Discharge Plan and Services ?Expected Discharge Plan: [place] ?  ?Discharge Planning Services: CM Consult ?Post Acute Care Choice: [place] ?Living arrangements for the past 2 months: Apartment ?                ?  ?  ?  ?  ?  ?  ?  ?  ?  ?  ? ? ?Social Determinants of Health (SDOH) Interventions ?  ? ?Readmission Risk Interventions ?   ? View : No data to display.  ?  ?  ?  ? ? ?

## 2021-10-10 DIAGNOSIS — J90 Pleural effusion, not elsewhere classified: Secondary | ICD-10-CM | POA: Diagnosis not present

## 2021-10-10 LAB — URINE CULTURE: Culture: <10000 — AB

## 2021-10-10 MED ORDER — FLEET ENEMA 7-19 GM/118ML RE ENEM
1.0000 | ENEMA | Freq: Once | RECTAL | Status: AC
  Administered 2021-10-10: 1 via RECTAL

## 2021-10-10 NOTE — Progress Notes (Signed)
PT Cancellation Note ? ?Patient Details ?Name: [name] ?MRN: [id number] ?DOB: 09-May-1928 ? ? ?Cancelled Treatment:     Therapist in to see pt for continued PT. Nursing in to administer meds and enema.  Will try to return next available date/time.  ? ? ?Josie Dixon ?10/10/2021, 12:08 PM ?

## 2021-10-10 NOTE — Progress Notes (Signed)
?Progress Note ? ? ?Patient: [name] NLG:[id number] DOB: 1928-06-01 DOA: 10/01/2021     9 ?DOS: the patient was seen and examined on 10/10/2021 ?  ?Brief hospital course: ?88 year old female with past medical history of severe protein calorie malnutrition, anxiety disorder and chronic pain from postherpetic neuralgia and peripheral arterial disease who goes to the wound care center who reportedly had COVID back in November and brought in on 3/26 for cough and loss of balance.  Patient found to be mildly hypoxic with a large right pleural effusion and COVID-positive.  Patient also found to be hypokalemic as well as hemoglobinuria.  Admitted to the hospitalist service. ? ?On morning of 3/27, patient had episode of persistent SVT requiring 1 dose of IV Lopressor which resolved the issue.  That afternoon, interventional radiology performed right-sided thoracentesis removing 450 cc of fluid.  On early morning of 3/28, patient complaining of some chest pain.  Troponins noted minimal elevation.  Cardiology reviewed case, but felt that given her advanced age and minimal change in troponin, no intervention recommended at this time.  Patient seen by palliative care, and plan is to proceed with hospice placement if she declines. ? ?Currently stable-we will proceed for SNF. ? ?Patient was tested positive for COVID-19 on 10/01/2021 but CT value was 42.8 which means it is a prior infection.  Symptoms for the past couple of weeks and a prior positive in November 2022. ?Clinically there is no need for isolation and she should be able to go to rehab earlier. ?Current plan is to send her to rehab on 10/12/2021 after completing 10 days of quarantine as required by the rehab. ? ?4/3: Patient was complaining of some dysuria.  UA and urine culture ordered. ? ?4/4: UA with mild microscopic hematuria and urine cultures with insignificant growth. ?Patient was complaining of headache and constipation. ? ? ?Assessment and Plan: ?* Pleural  effusion ?Unclear etiology for cause.?  COVID.  Status post thoracentesis.  Repeat chest x-ray 3/29 looking for any reaccumulation.  Shortness of breath better following thoracentesis.  Weaned off of oxygen. ?-Continue to monitor. ? ?SVT (supraventricular tachycardia) ([place]) ?Following admission, patient went into episodes of SVT with a heart rate as high as 160s.  Likely set off by hypokalemia and large pleural effusion.  Gave 1 dose of IV Lopressor which seem to have resolved situation.  IV Lopressor made as needed. ?-Remains stable ? ?COVID-19 virus infection ?Patient reportedly has had symptoms for 2 weeks.  Also reportedly had COVID 4 months ago.  CRP checked on 3/28 was elevated at 5.2. ?CT value of positive COVID is 42.8 which makes it a prior infection. ?-No need for isolation anymore. ?-TOC to find out from facility if they can take her before, actual proposed date of discharge is 10/12/2021 ? ? ?Hypokalemia ?Resolved. ?-Continue to monitor and replete as needed ? ?Protein-calorie malnutrition, severe ?BMI only 16.  High mortality risk. ?Nutrition Status: ?Nutrition Problem: Increased nutrient needs ?Etiology: acute illness ?Signs/Symptoms: meal completion < 25% ?Interventions: Ensure Enlive (each supplement provides 350kcal and 20 grams of protein), MVI, Magic cup ? ?Palliative care consulted. ?Plan is to proceed with hospice care if she declines and discharged to SNF if remains stable, currently stable. ? ?Chronic diastolic CHF (congestive heart failure) ([place]) ?Grade 1 diastolic dysfunction seen on echocardiogram in December 2021.  Elevated BNP.  Patient did have a pleural effusion on admission. ?Clinically seems euvolemic. ?-Continue to monitor ? ?Hemoglobinuria ?Noted hemoglobinuria without too many red cells.  Normal  CK level.  Could be secondary to prolonged immobilization versus COVID.  Patient received IV fluid which has been discontinued now. ? ?PAD (peripheral artery disease) ([place]) ?Continue wound  care for vascular wounds.  ?Continue home pain regimen. ? ?Hypertension ?- Continue amlodipine ? ?Anxiety disorder ?Continue benzos ? ?Narcotic dependence ([place]) ?Patient on chronic MSIR for her postherpetic neuralgia and arterial disease wounds.  Continue. ? ?Postherpetic neuralgia ?Continue Topamax and MSIR ? ?Dysuria ?Patient was complaining of dysuria this morning.  No Foley ?UA with microscopic hematuria and urine cultures with insignificant growth. ?-Encourage p.o. hydration ? ? ? ?  ? ?Subjective: Patient was resting comfortably when seen today.  She was concerned about constipation and some headache.  No nausea or vomiting. ? ?Physical Exam: ?Vitals:  ? 10/09/21 2126 10/10/21 0409 10/10/21 0845 10/10/21 1543  ?BP: (!) 164/71 133/65 (!) 146/67 111/78  ?Pulse: 80 79 80 77  ?Resp: '15 18 17 16  '$ ?Temp: 98.3 ?F (36.8 ?C) 98.8 ?F (37.1 ?C) 97.7 ?F (36.5 ?C) 97.9 ?F (36.6 ?C)  ?TempSrc: Oral Oral Oral Oral  ?SpO2: 97% 95% 95% 95%  ?Weight:      ?Height:      ? ?General.  Very frail and cachectic elderly lady, in no acute distress. ?Pulmonary.  Lungs clear bilaterally, normal respiratory effort. ?CV.  Regular rate and rhythm, no JVD, rub or murmur. ?Abdomen.  Soft, nontender, nondistended, BS positive. ?CNS.  Alert and oriented .  No focal neurologic deficit. ?Extremities.  No edema, no cyanosis, pulses intact and symmetrical. ? ?Data Reviewed: ?Prior notes and labs reviewed. ? ?Family Communication:  ? ?Disposition: ?Status is: Inpatient ?Remains inpatient appropriate because: Severity of illness, completing quarantine.  Before going to rehab on 10/12/2021 ? ? Planned Discharge Destination: Skilled nursing facility ? ?DVT prophylaxis.  Lovenox ? ?Time spent: 40 minutes ? ?This record has been created using Systems analyst. Errors have been sought and corrected,but may not always be located. Such creation errors do not reflect on the standard of care. ? ?Author: ?Lorella Nimrod, MD ?10/10/2021 3:51  PM ? ?For on call review [email or web address].  ?

## 2021-10-10 NOTE — Assessment & Plan Note (Signed)
Patient was complaining of dysuria this morning.  No Foley ?UA with microscopic hematuria and urine cultures with insignificant growth. ?-Encourage p.o. hydration ? ?

## 2021-10-11 ENCOUNTER — Inpatient Hospital Stay: Payer: PPO

## 2021-10-11 MED ORDER — LIDOCAINE 5 % EX PTCH
1.0000 | MEDICATED_PATCH | CUTANEOUS | Status: DC
  Administered 2021-10-11 – 2021-10-12 (×2): 1 via TRANSDERMAL
  Filled 2021-10-11 (×2): qty 1

## 2021-10-11 NOTE — Progress Notes (Signed)
?Progress Note ? ? ?Patient: [name] ALP:[id number] DOB: [birth year] DOA: 10/01/2021     10 ?DOS: the patient was seen and examined on 10/11/2021 ?  ?Brief hospital course: ?88 year old female with past medical history of severe protein calorie malnutrition, anxiety disorder and chronic pain from postherpetic neuralgia and peripheral arterial disease who goes to the wound care center who reportedly had COVID back in November and brought in on 3/26 for cough and loss of balance.  Patient found to be mildly hypoxic with a large right pleural effusion and COVID-positive.  Patient also found to be hypokalemic as well as hemoglobinuria.  Admitted to the hospitalist service. ? ?On morning of 3/27, patient had episode of persistent SVT requiring 1 dose of IV Lopressor which resolved the issue.  That afternoon, interventional radiology performed right-sided thoracentesis removing 450 cc of fluid.  On early morning of 3/28, patient complaining of some chest pain.  Troponins noted minimal elevation.  Cardiology reviewed case, but felt that given her advanced age and minimal change in troponin, no intervention recommended at this time.  Patient seen by palliative care, and plan is to proceed with hospice placement if she declines. ? ?Currently stable-we will proceed for SNF. ? ?Patient was tested positive for COVID-19 on 10/01/2021 but CT value was 42.8 which means it is a prior infection.  Symptoms for the past couple of weeks and a prior positive in November 2022. ?Clinically there is no need for isolation and she should be able to go to rehab earlier. ?Current plan is to send her to rehab on 10/12/2021 after completing 10 days of quarantine as required by the rehab. ? ?4/3: Patient was complaining of some dysuria.  UA and urine culture ordered. ? ?4/4: UA with mild microscopic hematuria and urine cultures with insignificant growth. ?Patient was complaining of headache and constipation. ? ?4/5: Patient was complaining of some  pain around the lower rib cage, increased with deep breathing.  Chest x-ray was obtained which shows minimal bibasilar subsegmental atelectasis with small bilateral pleural effusions.  Lidocaine patch was applied which resulted in improvement of pain.  Most likely secondary to staying in bed in some awkward position as pain appears more musculoskeletal.  Encourage patient to stay out of bed. ?We will be discharging to rehab tomorrow after completing quarantine. ? ? ?Assessment and Plan: ?* Pleural effusion ?Unclear etiology for cause.?  COVID.  Status post thoracentesis.  Repeat chest x-ray 3/29 looking for any reaccumulation.  Shortness of breath better following thoracentesis.  Weaned off of oxygen. ?Repeat chest x-ray which was done today for some concern of pleuritic pain with small bilateral pleural effusions and bibasilar atelectasis. ?-Continue to monitor. ? ?SVT (supraventricular tachycardia) ([place]) ?Following admission, patient went into episodes of SVT with a heart rate as high as 160s.  Likely set off by hypokalemia and large pleural effusion.  Gave 1 dose of IV Lopressor which seem to have resolved situation.  IV Lopressor made as needed. ?-Remains stable ? ?COVID-19 virus infection ?Patient reportedly has had symptoms for 2 weeks.  Also reportedly had COVID 4 months ago.  CRP checked on 3/28 was elevated at 5.2. ?CT value of positive COVID is 42.8 which makes it a prior infection. ?-No need for isolation anymore. ?-TOC to find out from facility if they can take her before, actual proposed date of discharge is 10/12/2021 ? ? ?Hypokalemia ?Resolved. ?-Continue to monitor and replete as needed ? ?Protein-calorie malnutrition, severe ?BMI only 16.  High mortality risk. ?  Nutrition Status: ?Nutrition Problem: Increased nutrient needs ?Etiology: acute illness ?Signs/Symptoms: meal completion < 25% ?Interventions: Ensure Enlive (each supplement provides 350kcal and 20 grams of protein), MVI, Magic  cup ? ?Palliative care consulted. ?Plan is to proceed with hospice care if she declines and discharged to SNF if remains stable, currently stable. ? ?Chronic diastolic CHF (congestive heart failure) ([place]) ?Grade 1 diastolic dysfunction seen on echocardiogram in December 2021.  Elevated BNP.  Patient did have a pleural effusion on admission. ?Clinically seems euvolemic. ?-Continue to monitor ? ?Hemoglobinuria ?Noted hemoglobinuria without too many red cells.  Normal CK level.  Could be secondary to prolonged immobilization versus COVID.  Patient received IV fluid which has been discontinued now. ? ?PAD (peripheral artery disease) ([place]) ?Continue wound care for vascular wounds.  ?Continue home pain regimen. ? ?Hypertension ?- Continue amlodipine ? ?Anxiety disorder ?Continue benzos ? ?Narcotic dependence ([place]) ?Patient on chronic MSIR for her postherpetic neuralgia and arterial disease wounds.  Continue. ? ?Postherpetic neuralgia ?Continue Topamax and MSIR ? ?Dysuria ?Patient was complaining of dysuria this morning.  No Foley ?UA with microscopic hematuria and urine cultures with insignificant growth. ?-Encourage p.o. hydration ? ? ?  ? ?Subjective: Patient was seen and examined today.  Stating that lidocaine patch is really helping and pain has improved.  I encouraged her to get out of bed as this posture and staying in bed will cause worsening atelectasis and these type of musculoskeletal pains.  Patient seems understanding and promised me to get out of bed. ? ?Physical Exam: ?Vitals:  ? 10/10/21 1543 10/10/21 1938 10/11/21 0509 10/11/21 0730  ?BP: 111/78 (!) 156/69 (!) 145/64 132/63  ?Pulse: 77 73 76 65  ?Resp: '16 16 14 16  '$ ?Temp: 97.9 ?F (36.6 ?C) 98.1 ?F (36.7 ?C) 98.3 ?F (36.8 ?C) 97.7 ?F (36.5 ?C)  ?TempSrc: Oral Oral  Oral  ?SpO2: 95% 97% 96% 95%  ?Weight:      ?Height:      ? ?General.  Frail and cachectic elderly lady, in no acute distress. ?Pulmonary.  Lungs clear bilaterally, normal respiratory  effort. ?CV.  Regular rate and rhythm, no JVD, rub or murmur. ?Abdomen.  Soft, nontender, nondistended, BS positive. ?CNS.  Alert and oriented to self.  No focal neurologic deficit. ?Extremities.  No edema, no cyanosis, pulses intact and symmetrical. ?Psychiatry.  Judgment and insight appears impaired ? ?Data Reviewed: ?Prior notes, and images reviewed. ? ?Family Communication: Son on phone ? ?Disposition: ?Status is: Inpatient ?Remains inpatient appropriate because: Waiting to complete quarantine.  Before leaving for SNF. ?Will be discharged tomorrow ? ? Planned Discharge Destination: Skilled nursing facility ? ?DVT prophylaxis.  Lovenox ? ?Time spent: 40 minutes ? ?This record has been created using Systems analyst. Errors have been sought and corrected,but may not always be located. Such creation errors do not reflect on the standard of care. ? ?Author: ?Lorella Nimrod, MD ?10/11/2021 4:15 PM ? ?For on call review [email or web address].  ?

## 2021-10-11 NOTE — Progress Notes (Signed)
Mobility Specialist - Progress Note ? ? 10/11/21 1000  ?Mobility  ?Activity Refused mobility  ? ? ? ?Pt sleeping on arrival, awakened to voice. Pt declined mobility; reports headache and pain in LUE---states she was given pain medication earlier. Encouragement for participation, however pt continued to decline. Will attempt another date/time.  ? ? ?Kathee Delton ?Mobility Specialist ?10/11/21, 10:36 AM ? ? ? ? ?

## 2021-10-11 NOTE — Progress Notes (Signed)
Took over pt care at this time, pt alert, no complaints  ?

## 2021-10-11 NOTE — Care Management Important Message (Signed)
Important Message ? ?Patient Details  ?Name: [name] ?MRN: [id number] ?Date of Birth: [birth year] ? ? ?Medicare Important Message Given:  Yes ? ?I reviewed the Important Message from Medicare with her son, Jeniya Flannigan and he is in agreement with the discharge plan. I wished her well and thanked him for his time. ? ? ?Juliann Pulse A Weslie Pretlow ?10/11/2021, 2:08 PM ?

## 2021-10-11 NOTE — Assessment & Plan Note (Signed)
Unclear etiology for cause.?  COVID.  Status post thoracentesis.  Repeat chest x-ray 3/29 looking for any reaccumulation.  Shortness of breath better following thoracentesis.  Weaned off of oxygen. ?Repeat chest x-ray which was done today for some concern of pleuritic pain with small bilateral pleural effusions and bibasilar atelectasis. ?-Continue to monitor. ?

## 2021-10-11 NOTE — Progress Notes (Signed)
Physical Therapy Treatment ?Patient Details ?Name: [name] ?MRN: [id number] ?DOB: 09-Apr-1928 ?Today's Date: 10/11/2021 ? ? ?History of Present Illness Pt is a 88 y.o. female presenting to hospital 3/26 with c/o weakness, cough, and AMS.  Pt slid and hit wall with L sided rib cage while upright day before hospital presentation.  (+) COVID-19.  Pt admitted with pleural effusion, COVID-19 (+), hypokalemia, htn, and protein-calorie malnutrition.  PMH includes anxiety, GERD, breast CA s/p B mastectomies, BCC of skin, partial thyroidectomy, h/o CVA, PAD with vascular ulcers L and R ankles, recent h/o postherpetic neuralgia affecting head and R eye, L IMN, and neck sx. ? ?  ?PT Comments  ? ? Pt agreed to out of bed with encouragement. Pt able to complete bed mobility and transfers with CG/Supervision primarily due to visual impairments. Pt with good tolerance for out of bed to chair, no c/o dizziness, good tolerance for strengthening exercises. Pt continues to be a good candidate for SNF placement upon d/c. ?  ?Recommendations for follow up therapy are one component of a multi-disciplinary discharge planning process, led by the attending physician.  Recommendations may be updated based on patient status, additional functional criteria and insurance authorization. ? ?Follow Up Recommendations ? Skilled nursing-short term rehab (<3 hours/day) ?  ?  ?Assistance Recommended at Discharge Frequent or constant Supervision/Assistance  ?Patient can return home with the following A lot of help with bathing/dressing/bathroom;Assistance with cooking/housework;Assist for transportation;A little help with walking and/or transfers;Help with stairs or ramp for entrance ?  ?Equipment Recommendations ?    ?  ?Recommendations for Other Services   ? ? ?  ?Precautions / Restrictions Precautions ?Precautions: Fall ?Precaution Comments: orthostatic ?Restrictions ?Weight Bearing Restrictions: No  ?  ? ?Mobility ? Bed Mobility ?Overal bed  mobility: Needs Assistance ?Bed Mobility: Sit to Supine, Supine to Sit ?  ?  ?Supine to sit: Min guard ?Sit to supine: Supervision ?  ?General bed mobility comments:  (Pt required assist due to poor vision) ?  ? ?Transfers ?Overall transfer level: Needs assistance ?Equipment used: Rolling walker (2 wheels) ?Transfers: Sit to/from Stand ?Sit to Stand: Supervision ?  ?  ?  ?  ?  ?General transfer comment:  (increased need for help due to poor eyesight) ?  ? ?Ambulation/Gait ?Ambulation/Gait assistance: Min guard ?Gait Distance (Feet): 10 Feet ?Assistive device: Rolling walker (2 wheels) ?Gait Pattern/deviations: Shuffle ?Gait velocity: decreased ?  ?  ?General Gait Details: decreased B LE step length/foot clearance ? ? ?Stairs ?  ?  ?  ?  ?  ? ? ?Wheelchair Mobility ?  ? ?Modified Rankin (Stroke Patients Only) ?  ? ? ?  ?Balance   ?  ?  ?  ?  ?  ?  ?  ?  ?  ?  ?  ?  ?  ?  ?  ?  ?  ?  ?  ? ?  ?Cognition Arousal/Alertness: Awake/alert ?Behavior During Therapy: [facility] for tasks assessed/performed ?Overall Cognitive Status: No family/caregiver present to determine baseline cognitive functioning ?  ?  ?  ?  ?  ?  ?  ?  ?  ?  ?  ?  ?  ?  ?  ?  ?General Comments: oriented to self, place, and grossly to situation ?  ?  ? ?  ?Exercises General Exercises - Lower Extremity ?Ankle Circles/Pumps: AROM, Both, 10 reps ?Long Arc Quad: AROM, Both, 10 reps ?Hip ABduction/ADduction: AROM, Both, 10 reps ?Hip Flexion/Marching: AROM, Both,  10 reps ? ?  ?General Comments General comments (skin integrity, edema, etc.):  (Sent message to Nursing/MD to assess for change in diet consistency due to not having teeth.) ?  ?  ? ?Pertinent Vitals/Pain Pain Assessment ?Pain Assessment: No/denies pain  ? ? ?Home Living   ?  ?  ?  ?  ?  ?  ?  ?  ?  ?   ?  ?Prior Function    ?  ?  ?   ? ?PT Goals (current goals can now be found in the care plan section) Acute Rehab PT Goals ?Patient Stated Goal: to go home ?Progress towards PT goals: Progressing toward  goals ? ?  ?Frequency ? ? ? Min 2X/week ? ? ? ?  ?PT Plan Current plan remains appropriate  ? ? ?Co-evaluation   ?  ?  ?  ?  ? ?  ?AM-PAC PT "6 Clicks" Mobility   ?Outcome Measure ? Help needed turning from your back to your side while in a flat bed without using bedrails?: None ?Help needed moving from lying on your back to sitting on the side of a flat bed without using bedrails?: A Lot ?Help needed moving to and from a bed to a chair (including a wheelchair)?: A Little ?Help needed standing up from a chair using your arms (e.g., wheelchair or bedside chair)?: A Little ?Help needed to walk in hospital room?: A Little ?Help needed climbing 3-5 steps with a railing? : A Lot ?6 Click Score: 17 ? ?  ?End of Session Equipment Utilized During Treatment: Gait belt ?Activity Tolerance: Patient tolerated treatment well ?Patient left: in chair;with call bell/phone within reach ?Nurse Communication:  (Mobility, ? change of food consistency) ?PT Visit Diagnosis: Unsteadiness on feet (R26.81);Other abnormalities of gait and mobility (R26.89);Muscle weakness (generalized) (M62.81);History of falling (Z91.81) ?  ? ? ?Time: 8003-4917 ?PT Time Calculation (min) (ACUTE ONLY): 32 min ? ?Charges:  $Therapeutic Exercise: 8-22 mins ?$Therapeutic Activity: 8-22 mins          ?          ?Mikel Cella, PTA ? ? ? ?Josie Dixon ?10/11/2021, 1:51 PM ? ?

## 2021-10-12 DIAGNOSIS — F064 Anxiety disorder due to known physiological condition: Secondary | ICD-10-CM | POA: Diagnosis not present

## 2021-10-12 DIAGNOSIS — I739 Peripheral vascular disease, unspecified: Secondary | ICD-10-CM | POA: Diagnosis not present

## 2021-10-12 DIAGNOSIS — R5381 Other malaise: Secondary | ICD-10-CM | POA: Diagnosis not present

## 2021-10-12 DIAGNOSIS — Z8673 Personal history of transient ischemic attack (TIA), and cerebral infarction without residual deficits: Secondary | ICD-10-CM | POA: Diagnosis not present

## 2021-10-12 DIAGNOSIS — R823 Hemoglobinuria: Secondary | ICD-10-CM | POA: Diagnosis not present

## 2021-10-12 DIAGNOSIS — K219 Gastro-esophageal reflux disease without esophagitis: Secondary | ICD-10-CM | POA: Diagnosis not present

## 2021-10-12 DIAGNOSIS — F419 Anxiety disorder, unspecified: Secondary | ICD-10-CM | POA: Diagnosis not present

## 2021-10-12 DIAGNOSIS — I5040 Unspecified combined systolic (congestive) and diastolic (congestive) heart failure: Secondary | ICD-10-CM | POA: Diagnosis not present

## 2021-10-12 DIAGNOSIS — E640 Sequelae of protein-calorie malnutrition: Secondary | ICD-10-CM | POA: Diagnosis not present

## 2021-10-12 DIAGNOSIS — Z7401 Bed confinement status: Secondary | ICD-10-CM | POA: Diagnosis not present

## 2021-10-12 DIAGNOSIS — J302 Other seasonal allergic rhinitis: Secondary | ICD-10-CM | POA: Diagnosis not present

## 2021-10-12 DIAGNOSIS — Z87891 Personal history of nicotine dependence: Secondary | ICD-10-CM | POA: Diagnosis not present

## 2021-10-12 DIAGNOSIS — M1990 Unspecified osteoarthritis, unspecified site: Secondary | ICD-10-CM | POA: Diagnosis not present

## 2021-10-12 DIAGNOSIS — F32A Depression, unspecified: Secondary | ICD-10-CM | POA: Diagnosis not present

## 2021-10-12 DIAGNOSIS — E785 Hyperlipidemia, unspecified: Secondary | ICD-10-CM | POA: Diagnosis not present

## 2021-10-12 DIAGNOSIS — R911 Solitary pulmonary nodule: Secondary | ICD-10-CM | POA: Diagnosis not present

## 2021-10-12 DIAGNOSIS — L97329 Non-pressure chronic ulcer of left ankle with unspecified severity: Secondary | ICD-10-CM | POA: Diagnosis not present

## 2021-10-12 DIAGNOSIS — Z8616 Personal history of COVID-19: Secondary | ICD-10-CM | POA: Diagnosis not present

## 2021-10-12 DIAGNOSIS — J90 Pleural effusion, not elsewhere classified: Secondary | ICD-10-CM | POA: Diagnosis not present

## 2021-10-12 DIAGNOSIS — R404 Transient alteration of awareness: Secondary | ICD-10-CM | POA: Diagnosis not present

## 2021-10-12 DIAGNOSIS — J309 Allergic rhinitis, unspecified: Secondary | ICD-10-CM | POA: Diagnosis not present

## 2021-10-12 DIAGNOSIS — Z853 Personal history of malignant neoplasm of breast: Secondary | ICD-10-CM | POA: Diagnosis not present

## 2021-10-12 DIAGNOSIS — I498 Other specified cardiac arrhythmias: Secondary | ICD-10-CM | POA: Diagnosis not present

## 2021-10-12 DIAGNOSIS — I110 Hypertensive heart disease with heart failure: Secondary | ICD-10-CM | POA: Diagnosis not present

## 2021-10-12 DIAGNOSIS — L97319 Non-pressure chronic ulcer of right ankle with unspecified severity: Secondary | ICD-10-CM | POA: Diagnosis not present

## 2021-10-12 DIAGNOSIS — M6281 Muscle weakness (generalized): Secondary | ICD-10-CM | POA: Diagnosis not present

## 2021-10-12 DIAGNOSIS — I471 Supraventricular tachycardia: Secondary | ICD-10-CM | POA: Diagnosis not present

## 2021-10-12 DIAGNOSIS — E876 Hypokalemia: Secondary | ICD-10-CM | POA: Diagnosis not present

## 2021-10-12 DIAGNOSIS — G629 Polyneuropathy, unspecified: Secondary | ICD-10-CM | POA: Diagnosis not present

## 2021-10-12 DIAGNOSIS — F1120 Opioid dependence, uncomplicated: Secondary | ICD-10-CM | POA: Diagnosis not present

## 2021-10-12 DIAGNOSIS — I5032 Chronic diastolic (congestive) heart failure: Secondary | ICD-10-CM | POA: Diagnosis not present

## 2021-10-12 DIAGNOSIS — B379 Candidiasis, unspecified: Secondary | ICD-10-CM | POA: Diagnosis not present

## 2021-10-12 DIAGNOSIS — E43 Unspecified severe protein-calorie malnutrition: Secondary | ICD-10-CM | POA: Diagnosis not present

## 2021-10-12 DIAGNOSIS — B0223 Postherpetic polyneuropathy: Secondary | ICD-10-CM | POA: Diagnosis not present

## 2021-10-12 DIAGNOSIS — F1320 Sedative, hypnotic or anxiolytic dependence, uncomplicated: Secondary | ICD-10-CM | POA: Diagnosis not present

## 2021-10-12 DIAGNOSIS — I10 Essential (primary) hypertension: Secondary | ICD-10-CM | POA: Diagnosis not present

## 2021-10-12 MED ORDER — GUAIFENESIN-DM 100-10 MG/5ML PO SYRP: 10.0000 mL | ORAL_SOLUTION | ORAL | 0 refills | Status: DC | PRN

## 2021-10-12 MED ORDER — SALINE SPRAY 0.65 % NA SOLN: 1.0000 | NASAL | 0 refills | Status: DC | PRN

## 2021-10-12 MED ORDER — LIDOCAINE 5 % EX PTCH: 1.0000 | MEDICATED_PATCH | CUTANEOUS | 0 refills | Status: DC

## 2021-10-12 MED ORDER — AMLODIPINE BESYLATE 10 MG PO TABS
10.0000 mg | ORAL_TABLET | Freq: Every day | ORAL | Status: DC
  Administered 2021-10-12: 10 mg via ORAL
  Filled 2021-10-12: qty 1

## 2021-10-12 MED ORDER — AMLODIPINE BESYLATE 10 MG PO TABS: 10.0000 mg | ORAL_TABLET | Freq: Every day | ORAL | Status: DC

## 2021-10-12 MED ORDER — ENSURE ENLIVE PO LIQD
237.0000 mL | Freq: Three times a day (TID) | ORAL | 12 refills | Status: DC
Start: 2021-10-12 — End: 2024-03-10

## 2021-10-12 MED ORDER — ACYCLOVIR 200 MG PO CAPS
200.0000 mg | ORAL_CAPSULE | Freq: Two times a day (BID) | ORAL | Status: DC
  Administered 2021-10-12: 200 mg via ORAL
  Filled 2021-10-12: qty 1

## 2021-10-12 MED ORDER — MORPHINE SULFATE 15 MG PO TABS
15.0000 mg | ORAL_TABLET | Freq: Three times a day (TID) | ORAL | 0 refills | Status: DC | PRN
Start: 2021-10-12 — End: 2021-12-06

## 2021-10-12 MED ORDER — ACYCLOVIR 200 MG PO CAPS: 200.0000 mg | ORAL_CAPSULE | Freq: Two times a day (BID) | ORAL | Status: DC

## 2021-10-12 MED ORDER — MAGIC MOUTHWASH: 15.0000 mL | Freq: Four times a day (QID) | ORAL | 0 refills | Status: DC | PRN

## 2021-10-12 NOTE — Discharge Summary (Signed)
?Physician Discharge Summary ?  ?Patient: [name] MRN: [id number] DOB: 1927-08-01  ?Admit date:     10/01/2021  ?Discharge date: 10/12/21  ?Discharge Physician: Lorella Nimrod  ? ?PCP: Venia Carbon, MD  ? ?Recommendations at discharge:  ?Please obtain CBC and BMP in 1 week. ?Follow-up with primary care provider within a week ?Patient is very frail and malnourished, she also does not have any teeth so need minced/chopped diet and Ensure in between as a supplement. ?We increased the dose of amlodipine to 10 mg because of elevated blood pressure. ?We decreased the dose of home acyclovir based on age and weight. ? ?Discharge Diagnoses: ?Principal Problem: ?  Pleural effusion ?Active Problems: ?  COVID-19 virus infection ?  SVT (supraventricular tachycardia) ([place]) ?  Hypokalemia ?  Protein-calorie malnutrition, severe ?  Chronic diastolic CHF (congestive heart failure) ([place]) ?  Hemoglobinuria ?  Hypertension ?  PAD (peripheral artery disease) ([place]) ?  Benzodiazepine dependence ([place]) ?  Anxiety disorder ?  Postherpetic neuralgia ?  Narcotic dependence ([place]) ?  Neuropathy ([place]) ?  Dysuria ? ? ?Hospital Course: ?88 year old female with past medical history of severe protein calorie malnutrition, anxiety disorder and chronic pain from postherpetic neuralgia and peripheral arterial disease who goes to the wound care center who reportedly had COVID back in November and brought in on 3/26 for cough and loss of balance.  Patient found to be mildly hypoxic with a large right pleural effusion and COVID-positive.  Patient also found to be hypokalemic as well as hemoglobinuria.  Admitted to the hospitalist service. ? ?On morning of 3/27, patient had episode of persistent SVT requiring 1 dose of IV Lopressor which resolved the issue.  That afternoon, interventional radiology performed right-sided thoracentesis removing 450 cc of fluid.  On early morning of 3/28, patient complaining of some chest pain.  Troponins noted minimal  elevation.  Cardiology reviewed case, but felt that given her advanced age and minimal change in troponin, no intervention recommended at this time.  Patient seen by palliative care, and plan is to proceed with hospice placement if she declines. ? ?Currently stable-we will proceed for SNF. ? ?Patient was tested positive for COVID-19 on 10/01/2021 but CT value was 42.8 which means it is a prior infection.  Symptoms for the past couple of weeks and a prior positive in November 2022. ?Clinically there is no need for isolation and she should be able to go to rehab earlier. ?Current plan is to send her to rehab on 10/12/2021 after completing 10 days of quarantine as required by the rehab. ? ?4/3: Patient was complaining of some dysuria.  UA and urine culture ordered. ? ?4/4: UA with mild microscopic hematuria and urine cultures with insignificant growth. ?Patient was complaining of headache and constipation. ? ?4/5: Patient was complaining of some pain around the lower rib cage, increased with deep breathing.  Chest x-ray was obtained which shows minimal bibasilar subsegmental atelectasis with small bilateral pleural effusions.  Lidocaine patch was applied which resulted in improvement of pain.  Most likely secondary to staying in bed in some awkward position as pain appears more musculoskeletal.  Encourage patient to stay out of bed. ? ?4/6: Patient completed the quarantine.  2 days ago, remained stable.  She is being discharged to rehab for further management.  She was started on low-dose hydralazine and amlodipine for blood pressure control in the hospital and continue with those medications. ? ?She will continue current medications and follow-up with her primary care  provider within a week for further recommendations. ? ?Assessment and Plan: ?* Pleural effusion ?Unclear etiology for cause.?  COVID.  Status post thoracentesis.  Repeat chest x-ray 3/29 looking for any reaccumulation.  Shortness of breath better following  thoracentesis.  Weaned off of oxygen. ?Repeat chest x-ray which was done today for some concern of pleuritic pain with small bilateral pleural effusions and bibasilar atelectasis. ?-Continue to monitor. ? ?SVT (supraventricular tachycardia) ([place]) ?Following admission, patient went into episodes of SVT with a heart rate as high as 160s.  Likely set off by hypokalemia and large pleural effusion.  Gave 1 dose of IV Lopressor which seem to have resolved situation.  IV Lopressor made as needed. ?-Remains stable ? ?COVID-19 virus infection ?Patient reportedly has had symptoms for 2 weeks.  Also reportedly had COVID 4 months ago.  CRP checked on 3/28 was elevated at 5.2. ?CT value of positive COVID is 42.8 which makes it a prior infection. ?-No need for isolation anymore. ?-TOC to find out from facility if they can take her before, actual proposed date of discharge is 10/12/2021 ? ? ?Hypokalemia ?Resolved. ?-Continue to monitor and replete as needed ? ?Protein-calorie malnutrition, severe ?BMI only 16.  High mortality risk. ?Nutrition Status: ?Nutrition Problem: Increased nutrient needs ?Etiology: acute illness ?Signs/Symptoms: meal completion < 25% ?Interventions: Ensure Enlive (each supplement provides 350kcal and 20 grams of protein), MVI, Magic cup ? ?Palliative care consulted. ?Plan is to proceed with hospice care if she declines and discharged to SNF if remains stable, currently stable. ? ?Chronic diastolic CHF (congestive heart failure) ([place]) ?Grade 1 diastolic dysfunction seen on echocardiogram in December 2021.  Elevated BNP.  Patient did have a pleural effusion on admission. ?Clinically seems euvolemic. ?-Continue to monitor ? ?Hemoglobinuria ?Noted hemoglobinuria without too many red cells.  Normal CK level.  Could be secondary to prolonged immobilization versus COVID.  Patient received IV fluid which has been discontinued now. ? ?PAD (peripheral artery disease) ([place]) ?Continue wound care for vascular wounds.   ?Continue home pain regimen. ? ?Hypertension ?- Continue amlodipine ? ?Anxiety disorder ?Continue benzos ? ?Narcotic dependence ([place]) ?Patient on chronic MSIR for her postherpetic neuralgia and arterial disease wounds.  Continue. ? ?Postherpetic neuralgia ?Continue Topamax and MSIR ? ?Dysuria ?Patient was complaining of dysuria this morning.  No Foley ?UA with microscopic hematuria and urine cultures with insignificant growth. ?-Encourage p.o. hydration ? ? ? ? ? ?Pain control - [employer] Controlled Substance Reporting System database was reviewed. and patient was instructed, not to drive, operate heavy machinery, perform activities at heights, swimming or participation in water activities or provide baby-sitting services while on Pain, Sleep and Anxiety Medications; until their outpatient Physician has advised to do so again. Also recommended to not to take more than prescribed Pain, Sleep and Anxiety Medications.  ? ?Consultants: None ?Procedures performed: Right-sided thoracentesis ?Disposition: Skilled nursing facility ?Diet recommendation:  ?Cardiac diet ?DISCHARGE MEDICATION: ?Allergies as of 10/12/2021   ? ?   Reactions  ? Duloxetine   ? REACTION: N/V ?Mental status change and trouble with balance  ? Cymbalta [duloxetine Hcl]   ? Unsure of reaction  ? Maxitrol [neomycin-polymyxin-dexameth]   ? Unsure of reaction  ? Pregabalin   ? REACTION: SOB, swollen lips  ? Tobradex [tobramycin-dexamethasone]   ? Unsure of reaction  ? ?  ? ?  ?Medication List  ?  ? ?STOP taking these medications   ? ?acyclovir 400 MG tablet ?Commonly known as: ZOVIRAX ?Replaced by: acyclovir 200 MG  capsule ?  ? ?  ? ?TAKE these medications   ? ?acyclovir 200 MG capsule ?Commonly known as: ZOVIRAX ?Take 1 capsule (200 mg total) by mouth 2 (two) times daily. ?Replaces: acyclovir 400 MG tablet ?  ?amLODipine 10 MG tablet ?Commonly known as: NORVASC ?Take 1 tablet (10 mg total) by mouth daily. ?Start taking on: October 13, 2021 ?  ?azelastine  0.1 % nasal spray ?Commonly known as: ASTELIN ?Place 2 sprays into both nostrils 2 (two) times daily. ?  ?feeding supplement Liqd ?Take 237 mLs by mouth 3 (three) times daily between meals. ?  ?fexofena

## 2021-10-12 NOTE — TOC Progression Note (Signed)
Transition of Care (TOC) - Progression Note  ? ? ?Patient Details  ?Name: [name] ?MRN: [id number] ?Date of Birth: 1928-03-07 ? ?Transition of Care (TOC) CM/SW Contact  ?Pete Pelt, RN ?Phone Number: ?10/12/2021, 8:52 AM ? ?Clinical Narrative:   Patient will be discharged to [facility] today via [facility].  Magda Paganini at [facility] and patient and family aware. ? ? ? ?Expected Discharge Plan: [place] ?Barriers to Discharge: Continued Medical Work up ? ?Expected Discharge Plan and Services ?Expected Discharge Plan: [place] ?  ?Discharge Planning Services: CM Consult ?Post Acute Care Choice: [place] ?Living arrangements for the past 2 months: Apartment ?                ?  ?  ?  ?  ?  ?  ?  ?  ?  ?  ? ? ?Social Determinants of Health (SDOH) Interventions ?  ? ?Readmission Risk Interventions ?   ? View : No data to display.  ?  ?  ?  ? ? ?

## 2021-10-12 NOTE — Progress Notes (Signed)
Pt A/Ox3 at this time. Report given to RN at [facility]. Belongings collected. Gown changed. PIV removed. No further needs.  ?

## 2021-10-13 ENCOUNTER — Encounter: Payer: PPO | Admitting: Physician Assistant

## 2021-10-17 DIAGNOSIS — I10 Essential (primary) hypertension: Secondary | ICD-10-CM | POA: Diagnosis not present

## 2021-10-17 DIAGNOSIS — I498 Other specified cardiac arrhythmias: Secondary | ICD-10-CM | POA: Diagnosis not present

## 2021-10-17 DIAGNOSIS — I5040 Unspecified combined systolic (congestive) and diastolic (congestive) heart failure: Secondary | ICD-10-CM | POA: Diagnosis not present

## 2021-10-17 DIAGNOSIS — E876 Hypokalemia: Secondary | ICD-10-CM | POA: Diagnosis not present

## 2021-10-17 DIAGNOSIS — F32A Depression, unspecified: Secondary | ICD-10-CM | POA: Diagnosis not present

## 2021-10-17 DIAGNOSIS — R823 Hemoglobinuria: Secondary | ICD-10-CM | POA: Diagnosis not present

## 2021-10-17 DIAGNOSIS — I739 Peripheral vascular disease, unspecified: Secondary | ICD-10-CM | POA: Diagnosis not present

## 2021-10-17 DIAGNOSIS — F064 Anxiety disorder due to known physiological condition: Secondary | ICD-10-CM | POA: Diagnosis not present

## 2021-10-17 DIAGNOSIS — J302 Other seasonal allergic rhinitis: Secondary | ICD-10-CM | POA: Diagnosis not present

## 2021-10-17 DIAGNOSIS — J90 Pleural effusion, not elsewhere classified: Secondary | ICD-10-CM | POA: Diagnosis not present

## 2021-10-20 ENCOUNTER — Encounter: Payer: PPO | Admitting: Physician Assistant

## 2021-10-20 DIAGNOSIS — F064 Anxiety disorder due to known physiological condition: Secondary | ICD-10-CM | POA: Diagnosis not present

## 2021-10-20 DIAGNOSIS — J302 Other seasonal allergic rhinitis: Secondary | ICD-10-CM | POA: Diagnosis not present

## 2021-10-20 DIAGNOSIS — I5040 Unspecified combined systolic (congestive) and diastolic (congestive) heart failure: Secondary | ICD-10-CM | POA: Diagnosis not present

## 2021-10-20 DIAGNOSIS — I498 Other specified cardiac arrhythmias: Secondary | ICD-10-CM | POA: Diagnosis not present

## 2021-10-20 DIAGNOSIS — J90 Pleural effusion, not elsewhere classified: Secondary | ICD-10-CM | POA: Diagnosis not present

## 2021-10-20 DIAGNOSIS — I10 Essential (primary) hypertension: Secondary | ICD-10-CM | POA: Diagnosis not present

## 2021-10-20 DIAGNOSIS — E876 Hypokalemia: Secondary | ICD-10-CM | POA: Diagnosis not present

## 2021-10-20 DIAGNOSIS — F32A Depression, unspecified: Secondary | ICD-10-CM | POA: Diagnosis not present

## 2021-10-20 DIAGNOSIS — I739 Peripheral vascular disease, unspecified: Secondary | ICD-10-CM | POA: Diagnosis not present

## 2021-10-23 DIAGNOSIS — I498 Other specified cardiac arrhythmias: Secondary | ICD-10-CM | POA: Diagnosis not present

## 2021-10-23 DIAGNOSIS — J302 Other seasonal allergic rhinitis: Secondary | ICD-10-CM | POA: Diagnosis not present

## 2021-10-23 DIAGNOSIS — I10 Essential (primary) hypertension: Secondary | ICD-10-CM | POA: Diagnosis not present

## 2021-10-23 DIAGNOSIS — E876 Hypokalemia: Secondary | ICD-10-CM | POA: Diagnosis not present

## 2021-10-23 DIAGNOSIS — R823 Hemoglobinuria: Secondary | ICD-10-CM | POA: Diagnosis not present

## 2021-10-23 DIAGNOSIS — J90 Pleural effusion, not elsewhere classified: Secondary | ICD-10-CM | POA: Diagnosis not present

## 2021-10-23 DIAGNOSIS — I5040 Unspecified combined systolic (congestive) and diastolic (congestive) heart failure: Secondary | ICD-10-CM | POA: Diagnosis not present

## 2021-10-23 DIAGNOSIS — I739 Peripheral vascular disease, unspecified: Secondary | ICD-10-CM | POA: Diagnosis not present

## 2021-10-23 DIAGNOSIS — F064 Anxiety disorder due to known physiological condition: Secondary | ICD-10-CM | POA: Diagnosis not present

## 2021-10-23 DIAGNOSIS — F32A Depression, unspecified: Secondary | ICD-10-CM | POA: Diagnosis not present

## 2021-10-25 DIAGNOSIS — I498 Other specified cardiac arrhythmias: Secondary | ICD-10-CM | POA: Diagnosis not present

## 2021-10-25 DIAGNOSIS — M6281 Muscle weakness (generalized): Secondary | ICD-10-CM | POA: Diagnosis not present

## 2021-10-25 DIAGNOSIS — F064 Anxiety disorder due to known physiological condition: Secondary | ICD-10-CM | POA: Diagnosis not present

## 2021-10-25 DIAGNOSIS — F32A Depression, unspecified: Secondary | ICD-10-CM | POA: Diagnosis not present

## 2021-10-25 DIAGNOSIS — I739 Peripheral vascular disease, unspecified: Secondary | ICD-10-CM | POA: Diagnosis not present

## 2021-10-25 DIAGNOSIS — E640 Sequelae of protein-calorie malnutrition: Secondary | ICD-10-CM | POA: Diagnosis not present

## 2021-10-25 DIAGNOSIS — J302 Other seasonal allergic rhinitis: Secondary | ICD-10-CM | POA: Diagnosis not present

## 2021-10-25 DIAGNOSIS — E876 Hypokalemia: Secondary | ICD-10-CM | POA: Diagnosis not present

## 2021-10-25 DIAGNOSIS — Z8616 Personal history of COVID-19: Secondary | ICD-10-CM | POA: Diagnosis not present

## 2021-10-25 DIAGNOSIS — K219 Gastro-esophageal reflux disease without esophagitis: Secondary | ICD-10-CM | POA: Diagnosis not present

## 2021-10-25 DIAGNOSIS — R823 Hemoglobinuria: Secondary | ICD-10-CM | POA: Diagnosis not present

## 2021-10-25 DIAGNOSIS — J90 Pleural effusion, not elsewhere classified: Secondary | ICD-10-CM | POA: Diagnosis not present

## 2021-10-27 ENCOUNTER — Encounter: Payer: PPO | Admitting: Physician Assistant

## 2021-10-27 DIAGNOSIS — E876 Hypokalemia: Secondary | ICD-10-CM | POA: Diagnosis not present

## 2021-10-27 DIAGNOSIS — I10 Essential (primary) hypertension: Secondary | ICD-10-CM | POA: Diagnosis not present

## 2021-10-27 DIAGNOSIS — I5040 Unspecified combined systolic (congestive) and diastolic (congestive) heart failure: Secondary | ICD-10-CM | POA: Diagnosis not present

## 2021-10-27 DIAGNOSIS — I739 Peripheral vascular disease, unspecified: Secondary | ICD-10-CM | POA: Diagnosis not present

## 2021-10-27 DIAGNOSIS — I498 Other specified cardiac arrhythmias: Secondary | ICD-10-CM | POA: Diagnosis not present

## 2021-10-27 DIAGNOSIS — R823 Hemoglobinuria: Secondary | ICD-10-CM | POA: Diagnosis not present

## 2021-10-30 DIAGNOSIS — I498 Other specified cardiac arrhythmias: Secondary | ICD-10-CM | POA: Diagnosis not present

## 2021-10-30 DIAGNOSIS — J302 Other seasonal allergic rhinitis: Secondary | ICD-10-CM | POA: Diagnosis not present

## 2021-10-30 DIAGNOSIS — E876 Hypokalemia: Secondary | ICD-10-CM | POA: Diagnosis not present

## 2021-10-30 DIAGNOSIS — I739 Peripheral vascular disease, unspecified: Secondary | ICD-10-CM | POA: Diagnosis not present

## 2021-10-30 DIAGNOSIS — F32A Depression, unspecified: Secondary | ICD-10-CM | POA: Diagnosis not present

## 2021-10-30 DIAGNOSIS — I5040 Unspecified combined systolic (congestive) and diastolic (congestive) heart failure: Secondary | ICD-10-CM | POA: Diagnosis not present

## 2021-10-30 DIAGNOSIS — R823 Hemoglobinuria: Secondary | ICD-10-CM | POA: Diagnosis not present

## 2021-10-30 DIAGNOSIS — F064 Anxiety disorder due to known physiological condition: Secondary | ICD-10-CM | POA: Diagnosis not present

## 2021-10-30 DIAGNOSIS — J90 Pleural effusion, not elsewhere classified: Secondary | ICD-10-CM | POA: Diagnosis not present

## 2021-10-30 DIAGNOSIS — I10 Essential (primary) hypertension: Secondary | ICD-10-CM | POA: Diagnosis not present

## 2021-10-31 ENCOUNTER — Other Ambulatory Visit: Payer: Self-pay | Admitting: Internal Medicine

## 2021-11-01 NOTE — Telephone Encounter (Signed)
Last filled 08-14-21 #60 ?Last OV 08-17-21 ?Next OV 11-20-21 ?[place] ?

## 2021-11-03 ENCOUNTER — Encounter: Payer: PPO | Admitting: Physician Assistant

## 2021-11-03 DIAGNOSIS — Z8679 Personal history of other diseases of the circulatory system: Secondary | ICD-10-CM | POA: Diagnosis not present

## 2021-11-03 DIAGNOSIS — J90 Pleural effusion, not elsewhere classified: Secondary | ICD-10-CM | POA: Diagnosis not present

## 2021-11-03 DIAGNOSIS — Z9889 Other specified postprocedural states: Secondary | ICD-10-CM | POA: Diagnosis not present

## 2021-11-08 DIAGNOSIS — I739 Peripheral vascular disease, unspecified: Secondary | ICD-10-CM | POA: Diagnosis not present

## 2021-11-08 DIAGNOSIS — I471 Supraventricular tachycardia: Secondary | ICD-10-CM | POA: Diagnosis not present

## 2021-11-08 DIAGNOSIS — F32A Depression, unspecified: Secondary | ICD-10-CM | POA: Diagnosis not present

## 2021-11-08 DIAGNOSIS — Z79891 Long term (current) use of opiate analgesic: Secondary | ICD-10-CM | POA: Diagnosis not present

## 2021-11-08 DIAGNOSIS — Z8616 Personal history of COVID-19: Secondary | ICD-10-CM | POA: Diagnosis not present

## 2021-11-08 DIAGNOSIS — B0229 Other postherpetic nervous system involvement: Secondary | ICD-10-CM | POA: Diagnosis not present

## 2021-11-08 DIAGNOSIS — G629 Polyneuropathy, unspecified: Secondary | ICD-10-CM | POA: Diagnosis not present

## 2021-11-08 DIAGNOSIS — K219 Gastro-esophageal reflux disease without esophagitis: Secondary | ICD-10-CM | POA: Diagnosis not present

## 2021-11-08 DIAGNOSIS — E43 Unspecified severe protein-calorie malnutrition: Secondary | ICD-10-CM | POA: Diagnosis not present

## 2021-11-08 DIAGNOSIS — J302 Other seasonal allergic rhinitis: Secondary | ICD-10-CM | POA: Diagnosis not present

## 2021-11-08 DIAGNOSIS — Z90710 Acquired absence of both cervix and uterus: Secondary | ICD-10-CM | POA: Diagnosis not present

## 2021-11-08 DIAGNOSIS — I5020 Unspecified systolic (congestive) heart failure: Secondary | ICD-10-CM | POA: Diagnosis not present

## 2021-11-08 DIAGNOSIS — I110 Hypertensive heart disease with heart failure: Secondary | ICD-10-CM | POA: Diagnosis not present

## 2021-11-08 DIAGNOSIS — R32 Unspecified urinary incontinence: Secondary | ICD-10-CM | POA: Diagnosis not present

## 2021-11-08 DIAGNOSIS — I498 Other specified cardiac arrhythmias: Secondary | ICD-10-CM | POA: Diagnosis not present

## 2021-11-08 DIAGNOSIS — F064 Anxiety disorder due to known physiological condition: Secondary | ICD-10-CM | POA: Diagnosis not present

## 2021-11-08 DIAGNOSIS — Z9013 Acquired absence of bilateral breasts and nipples: Secondary | ICD-10-CM | POA: Diagnosis not present

## 2021-11-08 DIAGNOSIS — I5032 Chronic diastolic (congestive) heart failure: Secondary | ICD-10-CM | POA: Diagnosis not present

## 2021-11-10 ENCOUNTER — Telehealth: Payer: Self-pay | Admitting: Internal Medicine

## 2021-11-10 NOTE — Telephone Encounter (Signed)
HH ORDERS  ? ?Caller Name: Gennaro Africa ?Home Health Agency Name: [facility] ?Callback Phone #: [phone] ?Service Requested: PT ?(examples: OT/PT/Skilled Nursing/Social Work/Speech Therapy/Wound Care) ?Frequency of Visits: once a week for 9 weeks ? ? ?  ?

## 2021-11-13 NOTE — Telephone Encounter (Signed)
Verbal orders left on verified VM for [employer]. Called and spoke to [facility], DIL. She said Mena is back home. Says she is doing really well. They have scaled back on a lot of her meds. She was already scheduled 11-20-21 for her 3 month F/U. I added time to that appt to do rehab f/u, also. ?

## 2021-11-20 ENCOUNTER — Ambulatory Visit (INDEPENDENT_AMBULATORY_CARE_PROVIDER_SITE_OTHER): Payer: PPO | Admitting: Internal Medicine

## 2021-11-20 ENCOUNTER — Encounter: Payer: Self-pay | Admitting: Internal Medicine

## 2021-11-20 DIAGNOSIS — I739 Peripheral vascular disease, unspecified: Secondary | ICD-10-CM | POA: Diagnosis not present

## 2021-11-20 DIAGNOSIS — B0229 Other postherpetic nervous system involvement: Secondary | ICD-10-CM | POA: Diagnosis not present

## 2021-11-20 DIAGNOSIS — E440 Moderate protein-calorie malnutrition: Secondary | ICD-10-CM

## 2021-11-20 DIAGNOSIS — I471 Supraventricular tachycardia: Secondary | ICD-10-CM

## 2021-11-20 DIAGNOSIS — I5032 Chronic diastolic (congestive) heart failure: Secondary | ICD-10-CM | POA: Diagnosis not present

## 2021-11-20 MED ORDER — OMEPRAZOLE 20 MG PO CPDR: 20.0000 mg | DELAYED_RELEASE_CAPSULE | Freq: Every day | ORAL | 3 refills | Status: DC

## 2021-11-20 MED ORDER — AMLODIPINE BESYLATE 10 MG PO TABS: 10.0000 mg | ORAL_TABLET | Freq: Every day | ORAL | 3 refills | Status: DC

## 2021-11-20 MED ORDER — ACYCLOVIR 200 MG PO CAPS: 200.0000 mg | ORAL_CAPSULE | Freq: Two times a day (BID) | ORAL | 3 refills | Status: DC

## 2021-11-20 NOTE — Assessment & Plan Note (Signed)
1 brief spell ?No Rx for this ?

## 2021-11-20 NOTE — Assessment & Plan Note (Signed)
Not as bad ?Lidocaine patch, daily morphine only ?Will try to wean the topiramate---down to '25mg'$  and then off in a month if not worse ?

## 2021-11-20 NOTE — Patient Instructions (Signed)
Please cut the topiramate in half and give just '25mg'$  at bedtime. If the shingles pain gets worse, go back to the '50mg'$ . ?If the shingles pain is not worse, after a month, you can try stopping it altogether. ?

## 2021-11-20 NOTE — Assessment & Plan Note (Signed)
Had been severe but has gained back some of her weight again ?Uses the ensure ?

## 2021-11-20 NOTE — Assessment & Plan Note (Signed)
Diagnosed in hospital ?May have been cause of pleural effusion--culture was negative ?Compensated without Rx now ?

## 2021-11-20 NOTE — Assessment & Plan Note (Signed)
All the ulcers have healed now ?

## 2021-11-20 NOTE — Progress Notes (Signed)
? ?Subjective:  ? ? Patient ID: [name], female    DOB: May 23, 1928, 88 y.o.   MRN: [id number] ? ?HPI ?Here with DIL for follow up of hospitalization late March to early April--then rehab ? ?Admitted with cough and was sick ?Incontinent, couldn't really get OOB, etc---so rescue to ER ?Found to have large right side pleural effusion--eventually had 450cc drained ?Brief spell of SVT at 160 ?Diagnosed with diastolic CHF----no ongoing problems now ?Was on oxygen for some time--finally weaned off but used again at rehab during the therapy ? ?Was at [facility] for rehab ?This was good ?Tried the long term care side---but was unhappy there (stuck in wheelchair all day) ?So she went home---her own home ?RN, PT, OT and speech all coming now ?DIL helps with showers. ?She and son do shopping, cooking, laundry ?Dresses herself---and uses bathroom ?Remains continent ?Walks with 2 wheeled walker ? ?Shingles pain is not bad ?Only using one morphine a day ?Using lidocaine pain patch on back for 12 hours ?Uses tylenol prn ? ?No chest pain ?No SOB ?No edema ?No dizziness or syncope ? ?Current Outpatient Medications on File Prior to Visit  ?Medication Sig Dispense Refill  ? acyclovir (ZOVIRAX) 200 MG capsule Take 1 capsule (200 mg total) by mouth 2 (two) times daily.    ? amLODipine (NORVASC) 10 MG tablet Take 1 tablet (10 mg total) by mouth daily.    ? antiseptic oral rinse (BIOTENE) LIQD 15 mLs by Mouth Rinse route as needed for dry mouth.    ? azelastine (ASTELIN) 0.1 % nasal spray Place 2 sprays into both nostrils 2 (two) times daily.    ? feeding supplement (ENSURE ENLIVE / ENSURE PLUS) LIQD Take 237 mLs by mouth 3 (three) times daily between meals. 237 mL 12  ? fexofenadine (ALLEGRA) 180 MG tablet Take 180 mg by mouth daily.    ? guaiFENesin-dextromethorphan (ROBITUSSIN DM) 100-10 MG/5ML syrup Take 10 mLs by mouth every 4 (four) hours as needed for cough. 118 mL 0  ? lidocaine (LIDODERM) 5 % Place 1 patch onto the skin  daily. Remove & Discard patch within 12 hours or as directed by MD 30 patch 0  ? LORazepam (ATIVAN) 1 MG tablet TAKE 1/2 TO 1 TABLET BY MOUTH TWICE DAILY AS NEEDED FOR ANXIETY 60 tablet 0  ? morphine (MSIR) 15 MG tablet Take 1 tablet (15 mg total) by mouth 3 (three) times daily as needed for moderate pain. (Patient taking differently: Take 15 mg by mouth 3 (three) times daily as needed for moderate pain. Pt is only taking 1 a day because of the Lidocaine Patch 12 hours a day) 30 tablet 0  ? Multiple Vitamin (MULTIVITAMIN) tablet Take 1 tablet by mouth once daily    ? polyethylene glycol (MIRALAX / GLYCOLAX) packet Take 17 g by mouth at bedtime.    ? RESTASIS 0.05 % ophthalmic emulsion Place 1 drop into both eyes 2 (two) times daily.    ? sodium chloride (OCEAN) 0.65 % SOLN nasal spray Place 1 spray into both nostrils as needed for congestion.  0  ? topiramate (TOPAMAX) 50 MG tablet Take 1 tablet (50 mg total) by mouth at bedtime. 30 tablet 11  ? ?No current facility-administered medications on file prior to visit.  ? ? ?Allergies  ?Allergen Reactions  ? Duloxetine   ?  REACTION: N/V ?Mental status change and trouble with balance  ? Cymbalta [Duloxetine Hcl]   ?  Unsure of reaction  ? Maxitrol [  Neomycin-Polymyxin-Dexameth]   ?  Unsure of reaction  ? Pregabalin   ?  REACTION: SOB, swollen lips  ? Tobradex [Tobramycin-Dexamethasone]   ?  Unsure of reaction  ? ? ?Past Medical History:  ?Diagnosis Date  ? Arrhythmia   ? Arthritis   ? Atypical chest pain   ? Lexiscan myoview (5/11) with EF 84%, normal wall motion, small fixed apical  perfusion defect likely due to breast attenuation, no evidence for ischemia or infarction.  **Patient had an  ? Basal cell carcinoma 07/28/19 [facility]  ? R pretibial  ? Breast cancer ([place])   ? Bilateral mastectomies 1986.  ? CVA (cerebral infarction) 10/12  ? right lacunar  ? Depression   ? Dyspnea   ? Echo (5/11) was a difficult study due to breast implants but showed normal LV and RV size and  systolic function.      ? GERD (gastroesophageal reflux disease)   ? Headache   ? s/p shingles - right side of head  ? History of partial thyroidectomy   ? Hyperlipidemia   ? Hypertension   ? in past - no current meds/issues  ? Obstruction of intestine ([facility])   ? Partial small bowel obstruction ([place]) 7/14  ? no surgery  ? Superficial basal cell carcinoma 06/18/2019  ? R mid to distal pretibial  ? Wears dentures   ? full upper  ? Zoster   ?  with Post-herpetic neuralgia  ? ? ?Past Surgical History:  ?Procedure Laterality Date  ? ABDOMINAL HYSTERECTOMY    ? BREAST SURGERY Bilateral   ? cancer - mastectomy and implant insertion  ? INTRAMEDULLARY (IM) NAIL INTERTROCHANTERIC Left 06/19/2020  ? Procedure: INTRAMEDULLARY (IM) NAIL INTERTROCHANTRIC;  Surgeon: Earnestine Leys, MD;  Location: [facility] ORS;  Service: Orthopedics;  Laterality: Left;  ? IR THORACENTESIS ASP PLEURAL SPACE W/IMG GUIDE  10/02/2021  ? MASTECTOMY  1980  ? bilateral  ? NECK SURGERY    ? TARSORRHAPHY Bilateral 08/16/2015  ? Procedure: MINOR TARSORRAPHY LATERAL PLACEMENT;  Surgeon: Karle Starch, MD;  Location: [facility];  Service: Ophthalmology;  Laterality: Bilateral;  ? VAGINAL DELIVERY    ? ? ?Family History  ?Problem Relation Age of Onset  ? Heart disease Son   ?     open heart surgery for a blockage  ? Heart Problems Son   ? Diabetes Son   ? Parkinson's disease Son   ? ? ?Social History  ? ?Socioeconomic History  ? Marital status: Widowed  ?  Spouse name: Not on file  ? Number of children: 2  ? Years of education: Not on file  ? Highest education level: Not on file  ?Occupational History  ? Occupation: Special educational needs teacher  ?  Employer: RETIRED  ?Tobacco Use  ? Smoking status: Former  ? Smokeless tobacco: Never  ?Substance and Sexual Activity  ? Alcohol use: No  ?  Alcohol/week: 0.0 standard drinks  ? Drug use: No  ? Sexual activity: Not Currently  ?Other Topics Concern  ? Not on file  ?Social History Narrative  ? No living will  ? No health care POA but  requests daughter-in-law [name] to do this  ? Would like attempts at resuscitation  ? No feeding tube   ?   ? MOST form done 08/17/21---she wants everything except tube feeds  ? ?Social Determinants of Health  ? ?Financial Resource Strain: Not on file  ?Food Insecurity: Not on file  ?Transportation Needs: Not on file  ?Physical Activity: Not  on file  ?Stress: Not on file  ?Social Connections: Not on file  ?Intimate Partner Violence: Not on file  ? ? ?Review of Systems ?Had lost weight down to the 60's ?Now eating better and weight back up close to the past ?Drinking ensure 2 a day ?Sleeping okay--with the lorazepam ? ?   ?Objective:  ? Physical Exam ?Constitutional:   ?   Appearance: Normal appearance.  ?Cardiovascular:  ?   Rate and Rhythm: Normal rate and regular rhythm.  ?   Heart sounds: No murmur heard. ?  No gallop.  ?Pulmonary:  ?   Effort: Pulmonary effort is normal.  ?   Breath sounds: Normal breath sounds. No wheezing or rales.  ?Abdominal:  ?   Palpations: Abdomen is soft.  ?   Tenderness: There is no abdominal tenderness.  ?Musculoskeletal:  ?   Cervical back: Neck supple.  ?   Right lower leg: No edema.  ?   Left lower leg: No edema.  ?Lymphadenopathy:  ?   Cervical: No cervical adenopathy.  ?Skin: ?   Findings: No rash.  ?Neurological:  ?   Mental Status: She is alert.  ?  ? ? ? ? ?   ?Assessment & Plan:  ? ?

## 2021-11-22 DIAGNOSIS — Z853 Personal history of malignant neoplasm of breast: Secondary | ICD-10-CM | POA: Diagnosis not present

## 2021-11-22 DIAGNOSIS — Z87891 Personal history of nicotine dependence: Secondary | ICD-10-CM | POA: Diagnosis not present

## 2021-11-22 DIAGNOSIS — Z9013 Acquired absence of bilateral breasts and nipples: Secondary | ICD-10-CM | POA: Diagnosis not present

## 2021-11-22 DIAGNOSIS — B0229 Other postherpetic nervous system involvement: Secondary | ICD-10-CM | POA: Diagnosis not present

## 2021-11-22 DIAGNOSIS — Z79891 Long term (current) use of opiate analgesic: Secondary | ICD-10-CM | POA: Diagnosis not present

## 2021-11-22 DIAGNOSIS — K219 Gastro-esophageal reflux disease without esophagitis: Secondary | ICD-10-CM | POA: Diagnosis not present

## 2021-11-22 DIAGNOSIS — F32A Depression, unspecified: Secondary | ICD-10-CM | POA: Diagnosis not present

## 2021-11-22 DIAGNOSIS — Z90710 Acquired absence of both cervix and uterus: Secondary | ICD-10-CM | POA: Diagnosis not present

## 2021-11-22 DIAGNOSIS — I739 Peripheral vascular disease, unspecified: Secondary | ICD-10-CM | POA: Diagnosis not present

## 2021-11-22 DIAGNOSIS — E43 Unspecified severe protein-calorie malnutrition: Secondary | ICD-10-CM | POA: Diagnosis not present

## 2021-11-22 DIAGNOSIS — I471 Supraventricular tachycardia: Secondary | ICD-10-CM | POA: Diagnosis not present

## 2021-11-22 DIAGNOSIS — F064 Anxiety disorder due to known physiological condition: Secondary | ICD-10-CM | POA: Diagnosis not present

## 2021-11-22 DIAGNOSIS — G629 Polyneuropathy, unspecified: Secondary | ICD-10-CM | POA: Diagnosis not present

## 2021-11-22 DIAGNOSIS — I110 Hypertensive heart disease with heart failure: Secondary | ICD-10-CM | POA: Diagnosis not present

## 2021-11-22 DIAGNOSIS — J302 Other seasonal allergic rhinitis: Secondary | ICD-10-CM | POA: Diagnosis not present

## 2021-11-22 DIAGNOSIS — I498 Other specified cardiac arrhythmias: Secondary | ICD-10-CM | POA: Diagnosis not present

## 2021-11-22 DIAGNOSIS — Z8616 Personal history of COVID-19: Secondary | ICD-10-CM | POA: Diagnosis not present

## 2021-11-22 DIAGNOSIS — I5020 Unspecified systolic (congestive) heart failure: Secondary | ICD-10-CM | POA: Diagnosis not present

## 2021-11-22 DIAGNOSIS — I5032 Chronic diastolic (congestive) heart failure: Secondary | ICD-10-CM | POA: Diagnosis not present

## 2021-11-22 DIAGNOSIS — Z8673 Personal history of transient ischemic attack (TIA), and cerebral infarction without residual deficits: Secondary | ICD-10-CM | POA: Diagnosis not present

## 2021-11-22 DIAGNOSIS — R32 Unspecified urinary incontinence: Secondary | ICD-10-CM | POA: Diagnosis not present

## 2021-11-28 DIAGNOSIS — Z515 Encounter for palliative care: Secondary | ICD-10-CM | POA: Diagnosis not present

## 2021-11-28 DIAGNOSIS — Z681 Body mass index (BMI) 19 or less, adult: Secondary | ICD-10-CM | POA: Diagnosis not present

## 2021-11-28 DIAGNOSIS — I471 Supraventricular tachycardia: Secondary | ICD-10-CM | POA: Diagnosis not present

## 2021-11-28 DIAGNOSIS — I5032 Chronic diastolic (congestive) heart failure: Secondary | ICD-10-CM | POA: Diagnosis not present

## 2021-11-28 DIAGNOSIS — Z853 Personal history of malignant neoplasm of breast: Secondary | ICD-10-CM | POA: Diagnosis not present

## 2021-11-28 DIAGNOSIS — Z8673 Personal history of transient ischemic attack (TIA), and cerebral infarction without residual deficits: Secondary | ICD-10-CM | POA: Diagnosis not present

## 2021-11-28 DIAGNOSIS — Z9013 Acquired absence of bilateral breasts and nipples: Secondary | ICD-10-CM | POA: Diagnosis not present

## 2021-11-28 DIAGNOSIS — E43 Unspecified severe protein-calorie malnutrition: Secondary | ICD-10-CM | POA: Diagnosis not present

## 2021-11-28 DIAGNOSIS — F32A Depression, unspecified: Secondary | ICD-10-CM | POA: Diagnosis not present

## 2021-11-28 DIAGNOSIS — R32 Unspecified urinary incontinence: Secondary | ICD-10-CM | POA: Diagnosis not present

## 2021-11-28 DIAGNOSIS — I110 Hypertensive heart disease with heart failure: Secondary | ICD-10-CM | POA: Diagnosis not present

## 2021-11-28 DIAGNOSIS — Z90710 Acquired absence of both cervix and uterus: Secondary | ICD-10-CM | POA: Diagnosis not present

## 2021-11-28 DIAGNOSIS — G629 Polyneuropathy, unspecified: Secondary | ICD-10-CM | POA: Diagnosis not present

## 2021-11-28 DIAGNOSIS — J302 Other seasonal allergic rhinitis: Secondary | ICD-10-CM | POA: Diagnosis not present

## 2021-11-28 DIAGNOSIS — I739 Peripheral vascular disease, unspecified: Secondary | ICD-10-CM | POA: Diagnosis not present

## 2021-11-28 DIAGNOSIS — I5030 Unspecified diastolic (congestive) heart failure: Secondary | ICD-10-CM | POA: Diagnosis not present

## 2021-11-28 DIAGNOSIS — I498 Other specified cardiac arrhythmias: Secondary | ICD-10-CM | POA: Diagnosis not present

## 2021-11-28 DIAGNOSIS — F064 Anxiety disorder due to known physiological condition: Secondary | ICD-10-CM | POA: Diagnosis not present

## 2021-11-28 DIAGNOSIS — Z79891 Long term (current) use of opiate analgesic: Secondary | ICD-10-CM | POA: Diagnosis not present

## 2021-11-28 DIAGNOSIS — K219 Gastro-esophageal reflux disease without esophagitis: Secondary | ICD-10-CM | POA: Diagnosis not present

## 2021-11-28 DIAGNOSIS — F339 Major depressive disorder, recurrent, unspecified: Secondary | ICD-10-CM | POA: Diagnosis not present

## 2021-11-28 DIAGNOSIS — I5020 Unspecified systolic (congestive) heart failure: Secondary | ICD-10-CM | POA: Diagnosis not present

## 2021-11-28 DIAGNOSIS — Z8616 Personal history of COVID-19: Secondary | ICD-10-CM | POA: Diagnosis not present

## 2021-11-28 DIAGNOSIS — B0229 Other postherpetic nervous system involvement: Secondary | ICD-10-CM | POA: Diagnosis not present

## 2021-11-28 DIAGNOSIS — Z87891 Personal history of nicotine dependence: Secondary | ICD-10-CM | POA: Diagnosis not present

## 2021-11-29 DIAGNOSIS — Z79891 Long term (current) use of opiate analgesic: Secondary | ICD-10-CM

## 2021-11-29 DIAGNOSIS — I110 Hypertensive heart disease with heart failure: Secondary | ICD-10-CM | POA: Diagnosis not present

## 2021-11-29 DIAGNOSIS — F064 Anxiety disorder due to known physiological condition: Secondary | ICD-10-CM

## 2021-11-29 DIAGNOSIS — K219 Gastro-esophageal reflux disease without esophagitis: Secondary | ICD-10-CM

## 2021-11-29 DIAGNOSIS — Z8673 Personal history of transient ischemic attack (TIA), and cerebral infarction without residual deficits: Secondary | ICD-10-CM

## 2021-11-29 DIAGNOSIS — B0229 Other postherpetic nervous system involvement: Secondary | ICD-10-CM

## 2021-11-29 DIAGNOSIS — E43 Unspecified severe protein-calorie malnutrition: Secondary | ICD-10-CM

## 2021-11-29 DIAGNOSIS — R32 Unspecified urinary incontinence: Secondary | ICD-10-CM

## 2021-11-29 DIAGNOSIS — Z8616 Personal history of COVID-19: Secondary | ICD-10-CM

## 2021-11-29 DIAGNOSIS — I471 Supraventricular tachycardia: Secondary | ICD-10-CM | POA: Diagnosis not present

## 2021-11-29 DIAGNOSIS — I5032 Chronic diastolic (congestive) heart failure: Secondary | ICD-10-CM | POA: Diagnosis not present

## 2021-11-29 DIAGNOSIS — I498 Other specified cardiac arrhythmias: Secondary | ICD-10-CM

## 2021-11-29 DIAGNOSIS — Z87891 Personal history of nicotine dependence: Secondary | ICD-10-CM

## 2021-11-29 DIAGNOSIS — I5020 Unspecified systolic (congestive) heart failure: Secondary | ICD-10-CM | POA: Diagnosis not present

## 2021-11-29 DIAGNOSIS — J302 Other seasonal allergic rhinitis: Secondary | ICD-10-CM

## 2021-11-29 DIAGNOSIS — Z9013 Acquired absence of bilateral breasts and nipples: Secondary | ICD-10-CM

## 2021-11-29 DIAGNOSIS — F32A Depression, unspecified: Secondary | ICD-10-CM

## 2021-11-29 DIAGNOSIS — Z90710 Acquired absence of both cervix and uterus: Secondary | ICD-10-CM

## 2021-11-29 DIAGNOSIS — Z853 Personal history of malignant neoplasm of breast: Secondary | ICD-10-CM

## 2021-11-29 DIAGNOSIS — G629 Polyneuropathy, unspecified: Secondary | ICD-10-CM

## 2021-11-29 DIAGNOSIS — I739 Peripheral vascular disease, unspecified: Secondary | ICD-10-CM

## 2021-12-05 ENCOUNTER — Other Ambulatory Visit: Payer: Self-pay | Admitting: Family

## 2021-12-05 DIAGNOSIS — I498 Other specified cardiac arrhythmias: Secondary | ICD-10-CM | POA: Diagnosis not present

## 2021-12-05 DIAGNOSIS — I5032 Chronic diastolic (congestive) heart failure: Secondary | ICD-10-CM | POA: Diagnosis not present

## 2021-12-05 DIAGNOSIS — Z9013 Acquired absence of bilateral breasts and nipples: Secondary | ICD-10-CM | POA: Diagnosis not present

## 2021-12-05 DIAGNOSIS — E43 Unspecified severe protein-calorie malnutrition: Secondary | ICD-10-CM | POA: Diagnosis not present

## 2021-12-05 DIAGNOSIS — I5020 Unspecified systolic (congestive) heart failure: Secondary | ICD-10-CM | POA: Diagnosis not present

## 2021-12-05 DIAGNOSIS — I110 Hypertensive heart disease with heart failure: Secondary | ICD-10-CM | POA: Diagnosis not present

## 2021-12-05 DIAGNOSIS — I471 Supraventricular tachycardia: Secondary | ICD-10-CM | POA: Diagnosis not present

## 2021-12-05 DIAGNOSIS — J302 Other seasonal allergic rhinitis: Secondary | ICD-10-CM | POA: Diagnosis not present

## 2021-12-05 DIAGNOSIS — G629 Polyneuropathy, unspecified: Secondary | ICD-10-CM | POA: Diagnosis not present

## 2021-12-05 DIAGNOSIS — B0229 Other postherpetic nervous system involvement: Secondary | ICD-10-CM | POA: Diagnosis not present

## 2021-12-05 DIAGNOSIS — Z90710 Acquired absence of both cervix and uterus: Secondary | ICD-10-CM | POA: Diagnosis not present

## 2021-12-05 DIAGNOSIS — F064 Anxiety disorder due to known physiological condition: Secondary | ICD-10-CM | POA: Diagnosis not present

## 2021-12-05 DIAGNOSIS — R32 Unspecified urinary incontinence: Secondary | ICD-10-CM | POA: Diagnosis not present

## 2021-12-05 DIAGNOSIS — K219 Gastro-esophageal reflux disease without esophagitis: Secondary | ICD-10-CM | POA: Diagnosis not present

## 2021-12-05 DIAGNOSIS — Z79891 Long term (current) use of opiate analgesic: Secondary | ICD-10-CM | POA: Diagnosis not present

## 2021-12-05 DIAGNOSIS — Z87891 Personal history of nicotine dependence: Secondary | ICD-10-CM | POA: Diagnosis not present

## 2021-12-05 DIAGNOSIS — Z853 Personal history of malignant neoplasm of breast: Secondary | ICD-10-CM | POA: Diagnosis not present

## 2021-12-05 DIAGNOSIS — Z8616 Personal history of COVID-19: Secondary | ICD-10-CM | POA: Diagnosis not present

## 2021-12-05 DIAGNOSIS — F32A Depression, unspecified: Secondary | ICD-10-CM | POA: Diagnosis not present

## 2021-12-05 DIAGNOSIS — I739 Peripheral vascular disease, unspecified: Secondary | ICD-10-CM | POA: Diagnosis not present

## 2021-12-05 DIAGNOSIS — Z8673 Personal history of transient ischemic attack (TIA), and cerebral infarction without residual deficits: Secondary | ICD-10-CM | POA: Diagnosis not present

## 2021-12-06 NOTE — Telephone Encounter (Signed)
Last refill: 10/12/21 #30,0 Last OV: 11/20/21 dx. CHF

## 2021-12-07 ENCOUNTER — Other Ambulatory Visit: Payer: Self-pay | Admitting: Internal Medicine

## 2021-12-07 DIAGNOSIS — Z853 Personal history of malignant neoplasm of breast: Secondary | ICD-10-CM | POA: Diagnosis not present

## 2021-12-07 DIAGNOSIS — I110 Hypertensive heart disease with heart failure: Secondary | ICD-10-CM | POA: Diagnosis not present

## 2021-12-07 DIAGNOSIS — I739 Peripheral vascular disease, unspecified: Secondary | ICD-10-CM | POA: Diagnosis not present

## 2021-12-07 DIAGNOSIS — Z8616 Personal history of COVID-19: Secondary | ICD-10-CM | POA: Diagnosis not present

## 2021-12-07 DIAGNOSIS — F064 Anxiety disorder due to known physiological condition: Secondary | ICD-10-CM | POA: Diagnosis not present

## 2021-12-07 DIAGNOSIS — F32A Depression, unspecified: Secondary | ICD-10-CM | POA: Diagnosis not present

## 2021-12-07 DIAGNOSIS — E43 Unspecified severe protein-calorie malnutrition: Secondary | ICD-10-CM | POA: Diagnosis not present

## 2021-12-07 DIAGNOSIS — J302 Other seasonal allergic rhinitis: Secondary | ICD-10-CM | POA: Diagnosis not present

## 2021-12-07 DIAGNOSIS — I471 Supraventricular tachycardia: Secondary | ICD-10-CM | POA: Diagnosis not present

## 2021-12-07 DIAGNOSIS — Z87891 Personal history of nicotine dependence: Secondary | ICD-10-CM | POA: Diagnosis not present

## 2021-12-07 DIAGNOSIS — Z90710 Acquired absence of both cervix and uterus: Secondary | ICD-10-CM | POA: Diagnosis not present

## 2021-12-07 DIAGNOSIS — I5020 Unspecified systolic (congestive) heart failure: Secondary | ICD-10-CM | POA: Diagnosis not present

## 2021-12-07 DIAGNOSIS — R32 Unspecified urinary incontinence: Secondary | ICD-10-CM | POA: Diagnosis not present

## 2021-12-07 DIAGNOSIS — Z8673 Personal history of transient ischemic attack (TIA), and cerebral infarction without residual deficits: Secondary | ICD-10-CM | POA: Diagnosis not present

## 2021-12-07 DIAGNOSIS — Z79891 Long term (current) use of opiate analgesic: Secondary | ICD-10-CM | POA: Diagnosis not present

## 2021-12-07 DIAGNOSIS — G629 Polyneuropathy, unspecified: Secondary | ICD-10-CM | POA: Diagnosis not present

## 2021-12-07 DIAGNOSIS — K219 Gastro-esophageal reflux disease without esophagitis: Secondary | ICD-10-CM | POA: Diagnosis not present

## 2021-12-07 DIAGNOSIS — I498 Other specified cardiac arrhythmias: Secondary | ICD-10-CM | POA: Diagnosis not present

## 2021-12-07 DIAGNOSIS — I5032 Chronic diastolic (congestive) heart failure: Secondary | ICD-10-CM | POA: Diagnosis not present

## 2021-12-07 DIAGNOSIS — Z9013 Acquired absence of bilateral breasts and nipples: Secondary | ICD-10-CM | POA: Diagnosis not present

## 2021-12-07 DIAGNOSIS — B0229 Other postherpetic nervous system involvement: Secondary | ICD-10-CM | POA: Diagnosis not present

## 2021-12-26 DIAGNOSIS — Z681 Body mass index (BMI) 19 or less, adult: Secondary | ICD-10-CM | POA: Diagnosis not present

## 2021-12-26 DIAGNOSIS — I5030 Unspecified diastolic (congestive) heart failure: Secondary | ICD-10-CM | POA: Diagnosis not present

## 2021-12-26 DIAGNOSIS — Z515 Encounter for palliative care: Secondary | ICD-10-CM | POA: Diagnosis not present

## 2021-12-26 DIAGNOSIS — D692 Other nonthrombocytopenic purpura: Secondary | ICD-10-CM | POA: Diagnosis not present

## 2021-12-26 DIAGNOSIS — I110 Hypertensive heart disease with heart failure: Secondary | ICD-10-CM | POA: Diagnosis not present

## 2021-12-26 DIAGNOSIS — I739 Peripheral vascular disease, unspecified: Secondary | ICD-10-CM | POA: Diagnosis not present

## 2021-12-27 ENCOUNTER — Telehealth: Payer: Self-pay

## 2021-12-27 DIAGNOSIS — E43 Unspecified severe protein-calorie malnutrition: Secondary | ICD-10-CM | POA: Diagnosis not present

## 2021-12-27 DIAGNOSIS — I5032 Chronic diastolic (congestive) heart failure: Secondary | ICD-10-CM | POA: Diagnosis not present

## 2021-12-27 DIAGNOSIS — G629 Polyneuropathy, unspecified: Secondary | ICD-10-CM | POA: Diagnosis not present

## 2021-12-27 DIAGNOSIS — J302 Other seasonal allergic rhinitis: Secondary | ICD-10-CM | POA: Diagnosis not present

## 2021-12-27 DIAGNOSIS — I498 Other specified cardiac arrhythmias: Secondary | ICD-10-CM | POA: Diagnosis not present

## 2021-12-27 DIAGNOSIS — Z79891 Long term (current) use of opiate analgesic: Secondary | ICD-10-CM | POA: Diagnosis not present

## 2021-12-27 DIAGNOSIS — Z8673 Personal history of transient ischemic attack (TIA), and cerebral infarction without residual deficits: Secondary | ICD-10-CM | POA: Diagnosis not present

## 2021-12-27 DIAGNOSIS — Z9013 Acquired absence of bilateral breasts and nipples: Secondary | ICD-10-CM | POA: Diagnosis not present

## 2021-12-27 DIAGNOSIS — I739 Peripheral vascular disease, unspecified: Secondary | ICD-10-CM | POA: Diagnosis not present

## 2021-12-27 DIAGNOSIS — Z87891 Personal history of nicotine dependence: Secondary | ICD-10-CM | POA: Diagnosis not present

## 2021-12-27 DIAGNOSIS — Z8616 Personal history of COVID-19: Secondary | ICD-10-CM | POA: Diagnosis not present

## 2021-12-27 DIAGNOSIS — I471 Supraventricular tachycardia: Secondary | ICD-10-CM | POA: Diagnosis not present

## 2021-12-27 DIAGNOSIS — Z853 Personal history of malignant neoplasm of breast: Secondary | ICD-10-CM | POA: Diagnosis not present

## 2021-12-27 DIAGNOSIS — F064 Anxiety disorder due to known physiological condition: Secondary | ICD-10-CM | POA: Diagnosis not present

## 2021-12-27 DIAGNOSIS — I5020 Unspecified systolic (congestive) heart failure: Secondary | ICD-10-CM | POA: Diagnosis not present

## 2021-12-27 DIAGNOSIS — F32A Depression, unspecified: Secondary | ICD-10-CM | POA: Diagnosis not present

## 2021-12-27 DIAGNOSIS — R32 Unspecified urinary incontinence: Secondary | ICD-10-CM | POA: Diagnosis not present

## 2021-12-27 DIAGNOSIS — K219 Gastro-esophageal reflux disease without esophagitis: Secondary | ICD-10-CM | POA: Diagnosis not present

## 2021-12-27 DIAGNOSIS — B0229 Other postherpetic nervous system involvement: Secondary | ICD-10-CM | POA: Diagnosis not present

## 2021-12-27 DIAGNOSIS — Z90710 Acquired absence of both cervix and uterus: Secondary | ICD-10-CM | POA: Diagnosis not present

## 2021-12-27 DIAGNOSIS — I110 Hypertensive heart disease with heart failure: Secondary | ICD-10-CM | POA: Diagnosis not present

## 2021-12-27 NOTE — Telephone Encounter (Signed)
[name] from [facility]  [phone]  Would like order sent to any DME company for shower chair. For assistance during bathing.

## 2021-12-28 NOTE — Telephone Encounter (Signed)
Left a message on VM for Gennaro Africa, PT for info on where to fax the order.

## 2022-01-02 ENCOUNTER — Other Ambulatory Visit: Payer: Self-pay | Admitting: Internal Medicine

## 2022-01-04 DIAGNOSIS — I110 Hypertensive heart disease with heart failure: Secondary | ICD-10-CM | POA: Diagnosis not present

## 2022-01-04 DIAGNOSIS — Z8673 Personal history of transient ischemic attack (TIA), and cerebral infarction without residual deficits: Secondary | ICD-10-CM | POA: Diagnosis not present

## 2022-01-04 DIAGNOSIS — Z79891 Long term (current) use of opiate analgesic: Secondary | ICD-10-CM | POA: Diagnosis not present

## 2022-01-04 DIAGNOSIS — J302 Other seasonal allergic rhinitis: Secondary | ICD-10-CM | POA: Diagnosis not present

## 2022-01-04 DIAGNOSIS — G629 Polyneuropathy, unspecified: Secondary | ICD-10-CM | POA: Diagnosis not present

## 2022-01-04 DIAGNOSIS — Z90710 Acquired absence of both cervix and uterus: Secondary | ICD-10-CM | POA: Diagnosis not present

## 2022-01-04 DIAGNOSIS — E43 Unspecified severe protein-calorie malnutrition: Secondary | ICD-10-CM | POA: Diagnosis not present

## 2022-01-04 DIAGNOSIS — F064 Anxiety disorder due to known physiological condition: Secondary | ICD-10-CM | POA: Diagnosis not present

## 2022-01-04 DIAGNOSIS — Z9013 Acquired absence of bilateral breasts and nipples: Secondary | ICD-10-CM | POA: Diagnosis not present

## 2022-01-04 DIAGNOSIS — I5032 Chronic diastolic (congestive) heart failure: Secondary | ICD-10-CM | POA: Diagnosis not present

## 2022-01-04 DIAGNOSIS — I739 Peripheral vascular disease, unspecified: Secondary | ICD-10-CM | POA: Diagnosis not present

## 2022-01-04 DIAGNOSIS — I471 Supraventricular tachycardia: Secondary | ICD-10-CM | POA: Diagnosis not present

## 2022-01-04 DIAGNOSIS — R32 Unspecified urinary incontinence: Secondary | ICD-10-CM | POA: Diagnosis not present

## 2022-01-04 DIAGNOSIS — Z87891 Personal history of nicotine dependence: Secondary | ICD-10-CM | POA: Diagnosis not present

## 2022-01-04 DIAGNOSIS — Z8616 Personal history of COVID-19: Secondary | ICD-10-CM | POA: Diagnosis not present

## 2022-01-04 DIAGNOSIS — I498 Other specified cardiac arrhythmias: Secondary | ICD-10-CM | POA: Diagnosis not present

## 2022-01-04 DIAGNOSIS — I5020 Unspecified systolic (congestive) heart failure: Secondary | ICD-10-CM | POA: Diagnosis not present

## 2022-01-04 DIAGNOSIS — F32A Depression, unspecified: Secondary | ICD-10-CM | POA: Diagnosis not present

## 2022-01-04 DIAGNOSIS — B0229 Other postherpetic nervous system involvement: Secondary | ICD-10-CM | POA: Diagnosis not present

## 2022-01-04 DIAGNOSIS — K219 Gastro-esophageal reflux disease without esophagitis: Secondary | ICD-10-CM | POA: Diagnosis not present

## 2022-01-04 DIAGNOSIS — Z853 Personal history of malignant neoplasm of breast: Secondary | ICD-10-CM | POA: Diagnosis not present

## 2022-01-20 ENCOUNTER — Other Ambulatory Visit: Payer: Self-pay

## 2022-01-20 ENCOUNTER — Emergency Department
Admission: EM | Admit: 2022-01-20 | Discharge: 2022-01-21 | Disposition: A | Discharge status: Home / Self Care | Payer: PPO | Attending: Emergency Medicine | Admitting: Emergency Medicine

## 2022-01-20 DIAGNOSIS — Z23 Encounter for immunization: Secondary | ICD-10-CM | POA: Insufficient documentation

## 2022-01-20 DIAGNOSIS — Y9389 Activity, other specified: Secondary | ICD-10-CM | POA: Insufficient documentation

## 2022-01-20 DIAGNOSIS — S8992XA Unspecified injury of left lower leg, initial encounter: Secondary | ICD-10-CM | POA: Diagnosis present

## 2022-01-20 DIAGNOSIS — W268XXA Contact with other sharp object(s), not elsewhere classified, initial encounter: Secondary | ICD-10-CM | POA: Insufficient documentation

## 2022-01-20 DIAGNOSIS — S81812A Laceration without foreign body, left lower leg, initial encounter: Secondary | ICD-10-CM | POA: Insufficient documentation

## 2022-01-20 MED ORDER — BACITRACIN ZINC 500 UNIT/GM EX OINT
TOPICAL_OINTMENT | Freq: Once | CUTANEOUS | Status: AC
  Administered 2022-01-20: 1 via TOPICAL
  Filled 2022-01-20: qty 1.8

## 2022-01-20 MED ORDER — LIDOCAINE-EPINEPHRINE 2 %-1:100000 IJ SOLN
20.0000 mL | Freq: Once | INTRAMUSCULAR | Status: AC
  Administered 2022-01-20: 20 mL
  Filled 2022-01-20: qty 1

## 2022-01-20 MED ORDER — ACETAMINOPHEN 500 MG PO TABS
1000.0000 mg | ORAL_TABLET | Freq: Once | ORAL | Status: AC
Start: 2022-01-20 — End: 2022-01-20
  Administered 2022-01-20: 1000 mg via ORAL
  Filled 2022-01-20: qty 2

## 2022-01-20 MED ORDER — TETANUS-DIPHTH-ACELL PERTUSSIS 5-2.5-18.5 LF-MCG/0.5 IM SUSY
0.5000 mL | PREFILLED_SYRINGE | Freq: Once | INTRAMUSCULAR | Status: AC
Start: 2022-01-20 — End: 2022-01-20
  Administered 2022-01-20: 0.5 mL via INTRAMUSCULAR
  Filled 2022-01-20: qty 0.5

## 2022-01-20 NOTE — ED Triage Notes (Signed)
Pt presents to ER from home c/o left lower leg lac that happened around 2100 tonight.  Pt states she was getting to bed, using her walker, turned and her leg got caught up with her walker.  Pt has appx 5cm skin tear/gash to anterior of left lower leg.  Pt denies fall, or blood thinner use. Some lower extremity edema noted to BIL lower extremities.  Pt is A&O in NAD at this time.

## 2022-01-20 NOTE — ED Notes (Signed)
MD at bedside to clean wound and educate family on appropriate wound care.

## 2022-01-21 NOTE — Discharge Instructions (Addendum)
Use Tylenol for pain and fevers.  Up to 1000 mg per dose, up to 4 times per day.  Do not take more than 4000 mg of Tylenol/acetaminophen within 24 hours..  Gently wash the wound with soap and water.  It is okay to shower, but do not submerge in a bath or go swimming as it is healing.  Do not vigorously scrub.   Gently pat dry.   Once dry, then apply Neosporin or bacitracin or even Vaseline ointment to the area to act as a barrier to help prevent infection.  Use the nonadherent gauze provided (or you can buy over-the-counter Telfa), then gently wrap.

## 2022-01-21 NOTE — ED Provider Notes (Signed)
[facility] Provider Note    Event Date/Time   First MD Initiated Contact with Patient 01/20/22 2257     (approximate)   History   Extremity Laceration   HPI  [name] is a 88 y.o. female who presents to the ED for evaluation of Extremity Laceration   I reviewed medical DC summary from 4/6.  Admitted medically for a pleural effusion associated with COVID-19 requiring a thoracentesis.  Patient lives at home but has strong support with family and neighbors.  Daughter-in-law checks in on her and helps her every day.   Patient's presents from home with her son and daughter-in-law for evaluation of a left lower leg skin tear and laceration that occurred accidentally when she caught her leg on some furniture at home.  She did not fall or hit the ground.  Denies any recent illnesses or fevers since her recent admission a couple months ago.   Physical Exam   Triage Vital Signs: ED Triage Vitals  Enc Vitals Group     BP 01/20/22 2216 (!) 157/68     Pulse Rate 01/20/22 2216 79     Resp 01/20/22 2216 18     Temp 01/20/22 2216 97.7 F (36.5 C)     Temp Source 01/20/22 2216 Oral     SpO2 01/20/22 2216 98 %     Weight 01/20/22 2215 83 lb (37.6 kg)     Height 01/20/22 2215 '5\' 5"'$  (1.651 m)     Head Circumference --      Peak Flow --      Pain Score 01/20/22 2214 6     Pain Loc --      Pain Edu? --      Excl. in [place]? --     Most recent vital signs: Vitals:   01/20/22 2216  BP: (!) 157/68  Pulse: 79  Resp: 18  Temp: 97.7 F (36.5 C)  SpO2: 98%    General: Awake, no distress.  CV:  Good peripheral perfusion.  Resp:  Normal effort.  Abd:  No distention.  MSK:  Left anterior mid shin, as pictured below, with a skin tear that is hemostatic with direct pressure.  Appears generally clean without foreign body.  A few clots are present. Centrally and longitudinally, there is slight deepening or disruption of the most superficial muscle layer,  possibly the anterior tibialis muscle.  But this does reapproximate easily with direct pressure and suture. . Leg is distally neurovascularly intact. No other signs of trauma to the extremities Neuro:  No focal deficits appreciated. Cranial nerves II through XII intact 5/5 strength and sensation in all 4 extremities Other:       ED Results / Procedures / Treatments   Labs (all labs ordered are listed, but only abnormal results are displayed) Labs Reviewed - No data to display  EKG   RADIOLOGY   Official radiology report(s): No results found.  PROCEDURES and INTERVENTIONS:  .Marland KitchenLaceration Repair  Date/Time: 01/21/2022 12:22 AM  Performed by: Vladimir Crofts, MD Authorized by: Vladimir Crofts, MD   Consent:    Consent obtained:  Verbal   Consent given by:  Guardian and patient   Risks, benefits, and alternatives were discussed: yes     Risks discussed:  Infection, pain, retained foreign body, tendon damage, poor wound healing, poor cosmetic result, need for additional repair, nerve damage and vascular damage   Alternatives discussed: just wound care vs attempted suture closure. Anesthesia:    Anesthesia  method:  Local infiltration   Local anesthetic:  Lidocaine 2% WITH epi Laceration details:    Location:  Leg   Leg location:  L lower leg   Length (cm):  8 Exploration:    Hemostasis achieved with:  Direct pressure   Contaminated: no   Treatment:    Area cleansed with:  Povidone-iodine   Amount of cleaning:  Extensive   Irrigation solution:  Sterile water   Irrigation volume:  1L   Visualized foreign bodies/material removed: no     Debridement:  Minimal   Undermining:  None   Scar revision: no     Layers/structures repaired:  Muscle belly Muscle belly:    Suture size:  4-0   Suture material:  Monocryl   Suture technique:  Horizontal mattress   Number of sutures:  2 Approximation:    Approximation:  Loose Repair type:    Repair type:  Intermediate Post-procedure  details:    Dressing:  Antibiotic ointment and non-adherent dressing   Procedure completion:  Tolerated well, no immediate complications   Medications  acetaminophen (TYLENOL) tablet 1,000 mg (1,000 mg Oral Given 01/20/22 2339)  lidocaine-EPINEPHrine (XYLOCAINE W/EPI) 2 %-1:100000 (with pres) injection 20 mL (20 mLs Infiltration Given 01/20/22 2340)  Tdap (BOOSTRIX) injection 0.5 mL (0.5 mLs Intramuscular Given 01/20/22 2340)  bacitracin ointment (1 Application Topical Given 01/20/22 2339)     IMPRESSION / MDM / ASSESSMENT AND PLAN / ED COURSE  I reviewed the triage vital signs and the nursing notes.  Differential diagnosis includes, but is not limited to, laceration, skin tear, fracture, stroke, head trauma  88 year old woman presents from home after striking her leg on some furniture causing a skin tear and laceration suitable for bedside repair and return back home with the care of her children.  Looks systemically well and has no evidence of neurologic or vascular deficits.  No signs of trauma beyond the left shin, as pictured above.  Generally a large skin tear not suitable for bedside repair, but after cleaning unable to visualize a more central longitudinal muscle disruption that does require some deep sutures for reapproximation.  Extensive wound care and cleaning performed at the bedside and I discussed management at home with the children.  We discussed referral to wound care and following up with them in the clinic as well as patient's PCP.  We discussed dressing changes at home and we provided them with supplies to do so for the next week or so until he can go to the pharmacy and pick up more supplies.  Patient's Tdap was updated.  We discussed return precautions and she is suitable for outpatient management.  Clinical Course as of 01/21/22 0024  Sun Jan 21, 2022  0013 Laceration repair complete.  Extensive discussion and education with children at the bedside throughout this process to  discuss wound care and care at home. [DS]    Clinical Course User Index [DS] Vladimir Crofts, MD     FINAL CLINICAL IMPRESSION(S) / ED DIAGNOSES   Final diagnoses:  Noninfected skin tear of left leg, initial encounter     Rx / DC Orders   ED Discharge Orders     None        Note:  This document was prepared using Dragon voice recognition software and may include unintentional dictation errors.   Vladimir Crofts, MD 01/21/22 [phone]

## 2022-01-22 DIAGNOSIS — H16231 Neurotrophic keratoconjunctivitis, right eye: Secondary | ICD-10-CM | POA: Diagnosis not present

## 2022-01-22 DIAGNOSIS — H02051 Trichiasis without entropian right upper eyelid: Secondary | ICD-10-CM | POA: Diagnosis not present

## 2022-02-05 ENCOUNTER — Encounter: Payer: PPO | Attending: Physician Assistant | Admitting: Physician Assistant

## 2022-02-05 DIAGNOSIS — X58XXXA Exposure to other specified factors, initial encounter: Secondary | ICD-10-CM | POA: Diagnosis not present

## 2022-02-05 DIAGNOSIS — Z853 Personal history of malignant neoplasm of breast: Secondary | ICD-10-CM | POA: Insufficient documentation

## 2022-02-05 DIAGNOSIS — I2510 Atherosclerotic heart disease of native coronary artery without angina pectoris: Secondary | ICD-10-CM | POA: Insufficient documentation

## 2022-02-05 DIAGNOSIS — Z9013 Acquired absence of bilateral breasts and nipples: Secondary | ICD-10-CM | POA: Diagnosis not present

## 2022-02-05 DIAGNOSIS — I7389 Other specified peripheral vascular diseases: Secondary | ICD-10-CM | POA: Insufficient documentation

## 2022-02-05 DIAGNOSIS — I10 Essential (primary) hypertension: Secondary | ICD-10-CM | POA: Insufficient documentation

## 2022-02-05 DIAGNOSIS — S81812A Laceration without foreign body, left lower leg, initial encounter: Secondary | ICD-10-CM | POA: Diagnosis not present

## 2022-02-05 DIAGNOSIS — L97822 Non-pressure chronic ulcer of other part of left lower leg with fat layer exposed: Secondary | ICD-10-CM | POA: Diagnosis not present

## 2022-02-05 NOTE — Progress Notes (Signed)
AIANNA, FAHS ([id number]) Visit Report for 02/05/2022 Chief Complaint Document Details Patient Name: [name], [name]. Date of Service: 02/05/2022 10:00 AM Medical Record Number: [id number] Patient Account Number: [id number] Date of Birth/Sex: 09-Feb-1928 (88 y.o. F) Treating RN: Levora Dredge Primary Care Provider: Viviana Simpler Other Clinician: Referring Provider: Vladimir Crofts Treating Provider/Extender: Skipper Cliche in Treatment: 0 Information Obtained from: Patient Chief Complaint Left anterior LE Laceration Electronic Signature(s) Signed: 02/05/2022 10:23:04 AM By: Worthy Keeler PA-C Entered By: Worthy Keeler on 02/05/2022 10:23:04 [name] ([id number]) -------------------------------------------------------------------------------- HPI Details Patient Name: [name], [name] Date of Service: 02/05/2022 10:00 AM Medical Record Number: [id number] Patient Account Number: [id number] Date of Birth/Sex: [birth year] (88 y.o. F) Treating RN: Levora Dredge Primary Care Provider: Viviana Simpler Other Clinician: Referring Provider: Vladimir Crofts Treating Provider/Extender: Skipper Cliche in Treatment: 0 History of Present Illness HPI Description: 10/21/2020 upon evaluation today patient presents for initial inspection here in our clinic concerning issues that she has been having quite some time in regard to her ankle and since she has been in the hospital with regard to the heel. The ankle in fact has been 5-6 years at least I am told. With that being said the heel ulcer occurred when she was in the hospital in December for hip surgery when she fractured her hip. She also was in the hospital Tuesday for altered mental status. Really there was nothing that was identified as the cause for this. She did have a. Fortunately there does not appear to be any signs of infection she tells me that she has had she believes arterial studies At [facility] clinic I could not find Those  studies at this point. Nonetheless I will continue to look and see what I can find before Then. The patient also has a skin tear on her arm this occurred more recently when she bumped this at home. The patient does have a history of hypertension, coronary artery disease, and peripheral vascular disease stated. 10/28/2020 I was able to find the patient's chart currently which shows that she did have an arterial study performed in May 2019. This showed that she had a abnormal right toe brachial index and a normal left toe brachial index. She was noncompressible as far as ABIs were concerned. She did appear to have triphasic flow at that time. Unfortunately the wound that is commented on in the report that I printed off and read mentions the same wound on the ankle that we are still dealing with at this point. Unfortunately this has not healed. And its been quite sometime about 3 years now. Fortunately there does not appear to be any signs of active infection systemically at this point. I think that the patient has done well with the Santyl over the past week which is good news. Patient's caregiver which is her daughter-in-law is concerned about the fact that she really does not feel qualified to be able to change the dressings and take care of this issue. There does not appear to be any signs of anything untoward going on at this point. I think she is done a great job applying the [employer] and I think that has done a great job for the patient is for soften up some of the necrotic tissue. With that being said I think the Xeroform on the arm also has done excellent. In general I am very pleased with where we stand. And I told the patient's daughter-in- law as well that I also feel  like she has done a great job over the past week taking care of her mother. 11/11/2020 upon evaluation today patient appears to be doing well with regard to her wounds. She has been tolerating the dressing changes without  complication her daughters been applying the Santyl which has done a great job. Ho with that being said I think now that we have good arterial study showing we can go ahead and proceed with sharp debridement at this point. 11/25/2020 upon evaluation today patient appears to be doing well with regard to her wounds. The Santyl has really helped to clean things up and overall she is doing quite excellent at this point. Fortunately there does not appear to be any signs of infection which is great news and in general extremely pleased. I do believe some debridement is in order and hopefully will be able to get her into a collagen dressing after this. 12/23/2020 upon evaluation today patient appears to be doing well with regard to her wound all things considered. Unfortunately it does sound like her daughter decided to let this air out because she felt like it was getting so wet. It sounds like she got border gauze dressings instead of border foam therefore there is really Nothing to catch the excess drainage which I think is the problem they were worried about the smell. Fortunately there does not appear to be any signs of active infection which is great news. Nonetheless I do not think we want to leave this just open to air. 01/13/2021 upon evaluation today patient's wounds actually appear to be about as good as have seen since have been taking care of her. Fortunately there does not appear to be any evidence of infection which is great and overall the biggest issue I see is that of fluid buildup. I think we need to do something to try to help manage this. I think if we can control her edema we get the wounds to heal more effectively. 01/27/2021 upon evaluation today patient appears to be doing well in regard to the wounds on her right lateral malleolus as well as the left heel and the right ischial tuberosity is unfortunately a new area that has arisen since we last saw her. She tells me currently that that is  from sitting too much. With that being said this actually appears to be doing the worst of anything so far that I see today. Fortunately there does not appear to be any signs of infection this is at least good news. Compression wrap seem to be doing excellent for her as far as the legs are concerned. 02/03/2021 upon evaluation today patient appears to be doing well with regard to all of her wounds. She has been tolerating the dressing changes without complication. Fortunately there is no signs of active infection at this time. No fevers, chills, nausea, vomiting, or diarrhea. 02/10/2021 upon evaluation today patient appears to be doing well with regard to her wounds. With that being said there is some slight evidence of hypergranulation in regard to the right ankle in particular and a little bit in regard to the left heel. I think Hydrofera Blue might be a better option to go to at this point based on what I am seeing especially since both of these wounds in particular seem to be a little bit stalled. I discussed that with the patient and her daughter today. With regard to the hip this is doing great with the collagen I think will get very close to  complete closure I recommend we continue with the collagen. 02/17/2021 upon evaluation today patient appears to be doing excellent in regard to her heel ulcers. She has been tolerating the dressing changes without complication and overall I am extremely pleased with where things stand today. There does not appear to be any signs of active infection which is great news. Overall I may think that we are headed in the proper direction based on what I am seeing. 02/24/2021 upon evaluation today patient's wounds actually are showing signs of improvement in regard to her heels both are doing awesome. In regard to her hip location this is actually reopened after being closed last week and to be perfectly honest I am more concerned here about the fact that pressures  causing this issue I do not think it has anything to do with her not having the collagen in place again it was completely healed last week there was no reason for the collagen to be there. 03/03/2021 upon evaluation today patient appears to be doing well with regard to her wounds. Everything is showing signs of improvement and TRINETTA, ALEMU. ([id number]) doing some much better as far as the overall size of the wound. Fortunately I think we are headed in the right direction. 03/10/2021 upon evaluation today patient appears to be doing well with regard to her wound. She is tolerating the dressing changes without complication. The left heel pretty much appears to be almost completely healed although I think it is probably can be 1 more week before I can call this 100% slough. The right ankle is significantly improved with that being said its not completely closed. With regard to the right hip this is completely closed. Overall I am very pleased with where things stand I think were getting very close to complete resolution. I discussed all this with the patient and her daughter-in-law today. 03/17/2021 upon evaluation today patient appears to be doing well with regard to her wounds. Fortunately there does not appear to be any signs of active infection which is great news and overall very pleased with where the patient stands today. I am in general thankful that overall she is making great progress here. There is good to be a little bit of debridement here to clear away some of the necrotic debris today on both wound locations. 03/24/2021 upon evaluation today patient appears to be doing excellent in regard to her wound. She has been tolerating the dressing changes without complication. Fortunately there does not appear to be any evidence of infection the left foot is completely healed this is great news. On the right at the ankle region this is still open though I do not think the alginate did quite as well  as I was hoping as far as getting this dried out. I think that we may just want to switch back to the [facility] which has done well up to this point. I was just hopeful this would wrap things up completely for [place]. 03/31/2021 upon evaluation today patient appears to be doing well with regard to her wound. This is on the right side which appears to be doing better than last week I think is definitely measuring smaller and this is great news. Overall I am very pleased with where we stand today. I do believe the [facility] is doing better for her. 04/14/2021 upon evaluation today patient appears to be doing well with regard to her wound. I am very pleased with the way it stands I think  that she is headed in the right direction. Unfortunately she is having issues with the Tubigrip neither she nor her daughter-in-law who is the primary caregiver can get this on. Subsequently that means that they have been having a very difficult time with complying with the necessary things for this left leg. With that being said I am really not sure what to do to help in that regard. We can always try a bigger that is larger size Tubigrip but at the same time that means that she may not get as good compression and it may not keep things under control the only way to know is to try. 04/21/2021 upon evaluation today patient appears to be doing well in regard to her wound. She has been tolerating the dressing changes without complication and in fact the right ankle ulcer is actually showing signs of excellent improvement I am very pleased with where things stand today. No fevers, chills, nausea, vomiting, or diarrhea. 04/28/2021 upon evaluation today patient appears to be doing well with regard to her ankle ulcer. This is actually showing signs of good improvement which is great news and overall very pleased with where we stand today. No fevers, chills, nausea, vomiting, or diarrhea. 05/05/2021 upon evaluation today  patient's wound is actually showing signs of significant improvement and overall I am extremely pleased with where we stand today. There does not appear to be any signs of active infection which is great news and overall I am extremely pleased with where we stand at this point. No fevers, chills, nausea, vomiting, or diarrhea. 05/12/2021 upon evaluation today patient appears to be doing okay in regard to her wound is measuring a little bit smaller today that is good news. With that being said she still continues to have significant issues here with an open wound on the lateral aspect of her ankle. Fortunately there does not appear to be any signs of active infection systemically which is great news. No fevers, chills, nausea, vomiting, or diarrhea. 06/13/2021 upon evaluation today patient unfortunately continues to have issues with her right leg. Specifically the ankle region where there is an open wound. Previously the wound on the left leg had completely closed. Nonetheless this left heel has reopened as well. I am going to perform some debridement in regard to the heel ulcer appears to be a fluid-filled blister underneath this area. She has not been here for such a long time due to the fact that she was very sick and they were not able to bring her out. That is the reason we have not seen her in over the past month. 06/20/2021 upon evaluation patient's wounds currently are doing well on the left heel this appears to be healed. There does not appear to be any signs of drainage at this time which is great news. No fevers, chills, nausea, vomiting, or diarrhea. On the right heel there is still an open wound but I feel like we are showing some signs of improvement this is still good to take a bit of time. 06/27/2021 upon evaluation today patient appears to be doing better in regard to her wound. This is actually showing signs of good improvement which is great news. Fortunately I do not see any signs of  infection currently which is great and overall we are finally seeing some actual decrease in the size I think that the compression wraps are making a big difference here. 12/27 some debris on the surface. No evidence of surrounding infection we have been  using collagen 07/21/2021 upon evaluation today patient appears to be doing well with regard to her wound. She has been tolerating the dressing changes without complication. Fortunately I do not see any evidence of active infection locally nor systemically at this time which is great news and overall I think the wound making excellent progress here. 08/04/2021 upon evaluation today patient's wound is showing signs of being a little bit moist and really not significantly smaller. Fortunately I do not see any signs of active infection locally nor systemically at this time that is good news. Nonetheless I do feel like she is doing quite well otherwise and her left leg is still doing awesome. 08/11/2021 upon evaluation today patient appears to be doing well with regard to her wound. She has been tolerating the dressing changes without complication. Fortunately there does not appear to be any signs of active infection at this time. The wound appears to be less macerated though I do believe she was moistening the alginate just a little bit I told her she does not need to do that when putting on a longer she voiced understanding and will not do so any longer. 08/18/2021 upon evaluation today patient's wound appears to be showing signs of being slightly larger compared to previous. We will talk in millimeters but nonetheless this is not headed in the right direction. Subsequently I am not sure exactly what the best plan will be going forward. I think Sorbact could be a good option willing to give this a try over the next week and see how things go. Unfortunately she does have a new blister on the left heel which has arisen nothing is open and I am hoping it will  reabsorb but again that may not be the case. We will have to keep a close eye on this. 2/17; right lateral malleolus which has been a difficult wound seems to be doing well. Under illumination healthy tissue this is filling in nicely using Sorbact hydrogel and a border foam dressing. [name] ([id number]) Apparently last week she developed a blister on her left medial calcaneus. Most of this is already epithelialized the use Sorbact on this as well. She is developing a threatened area on the medial aspect of her left first metatarsal head this is not open but looks like there is an irritation. They have ordered their adaptive foot wear which are arriving soon 09/01/2021 upon evaluation today patient appears to be doing very well in regard to her wounds in general. I do feel like that she is making excellent progress and overall I am extremely pleased with where we stand today. Fortunately there does not appear to be any evidence of active infection locally nor systemically at this time. Fortunately I do not see any evidence of active infection which is also. 09/08/2021 upon evaluation today patient appears to be doing well with regard to her wounds. She is tolerating the dressing changes without complication. Fortunately I do not see any evidence of active infection locally or systemically which is great news. 09/23/2019 upon evaluation today patient appears to be doing well with regard to her wounds. On the left side this appears to be completely healed on the right side though not completely healed this does seem to be doing significantly better which is great news. No fever chills noted Readmission: 02-05-2022 upon evaluation today patient presents for reevaluation in the clinic although this is for completely separate issue compared to what I previously saw her for. She sustained a  skin tear to the left lower extremity anteriorly which occurred 3 weeks ago. She was seen in the ER where they  subsequently were able to put several sutures and to reattach the skin flap fortunately this has saved her a lot of healing time. Fortunately I do not see any evidence of active infection locally or systemically which is great news. No fevers, chills, nausea, vomiting, or diarrhea. Electronic Signature(s) Signed: 02/05/2022 5:34:35 PM By: Worthy Keeler PA-C Entered By: Worthy Keeler on 02/05/2022 17:34:34 [name], [name] ([id number]) -------------------------------------------------------------------------------- Physical Exam Details Patient Name: MIJA, EFFERTZ Date of Service: 02/05/2022 10:00 AM Medical Record Number: [id number] Patient Account Number: [id number] Date of Birth/Sex: [birth year] (88 y.o. F) Treating RN: Cornell Barman Primary Care Provider: Viviana Simpler Other Clinician: Referring Provider: Vladimir Crofts Treating Provider/Extender: Jeri Cos Weeks in Treatment: 0 Constitutional Well-nourished and well-hydrated in no acute distress. Respiratory normal breathing without difficulty. Psychiatric this patient is able to make decisions and demonstrates good insight into disease process. Alert and Oriented x 3. pleasant and cooperative. Notes Upon inspection patient's wound bed actually showed signs of good granulation and epithelization at this point. Fortunately I do not see any evidence of infection which is great news and overall I am extremely pleased with where things stand currently. Electronic Signature(s) Signed: 02/05/2022 5:34:50 PM By: Worthy Keeler PA-C Entered By: Worthy Keeler on 02/05/2022 17:34:50 [name], [name] ([id number]) -------------------------------------------------------------------------------- Physician Orders Details Patient Name: [name], [name] Date of Service: 02/05/2022 10:00 AM Medical Record Number: [id number] Patient Account Number: [id number] Date of Birth/Sex: 1927-11-07 (88 y.o. F) Treating RN: Cornell Barman Primary Care Provider:  Viviana Simpler Other Clinician: Referring Provider: Vladimir Crofts Treating Provider/Extender: Skipper Cliche in Treatment: 0 Verbal / Phone Orders: No Diagnosis Coding ICD-10 Coding Code Description [phone] Laceration without foreign body, left lower leg, initial encounter I73.89 Other specified peripheral vascular diseases I10 Essential (primary) hypertension I25.10 Atherosclerotic heart disease of native coronary artery without angina pectoris Follow-up Appointments Wound #7 Left Lower Leg o Return Appointment in 2 weeks. Bathing/ Shower/ Hygiene o Wash wounds with antibacterial soap and water. o May shower; gently cleanse wound with antibacterial soap, rinse and pat dry prior to dressing wounds Edema Control - Lymphedema / Segmental Compressive Device / Other o Elevate, Exercise Daily and Avoid Standing for Long Periods of Time. o Elevate legs to the level of the heart and pump ankles as often as possible o Elevate leg(s) parallel to the floor when sitting. Wound Treatment Wound #7 - Lower Leg Wound Laterality: Left Cleanser: Soap and Water Discharge Instructions: Gently cleanse wound with antibacterial soap, rinse and pat dry prior to dressing wounds Topical: triple antibiotic (Generic) Primary Dressing: Non-Adherent Pad 3x8 (in/in) Discharge Instructions: Add to wound bed to alleviate sticking. Secondary Dressing: Secured With: Coban Cohesive Bandage 4x5 (yds) Stretched (Generic) Discharge Instructions: Apply Coban as directed. Electronic Signature(s) Signed: 02/05/2022 5:09:07 PM By: Gretta Cool, BSN, RN, CWS, Kim RN, BSN Signed: 02/05/2022 5:40:09 PM By: Worthy Keeler PA-C Entered By: Gretta Cool BSN, RN, CWS, Kim on 02/05/2022 10:52:20 [name], [name] ([id number]) -------------------------------------------------------------------------------- Problem List Details Patient Name: DANAYSIA, RADER Date of Service: 02/05/2022 10:00 AM Medical Record Number:  [id number] Patient Account Number: [id number] Date of Birth/Sex: [birth year] (88 y.o. F) Treating RN: Levora Dredge Primary Care Provider: Viviana Simpler Other Clinician: Referring Provider: Vladimir Crofts Treating Provider/Extender: Skipper Cliche in Treatment: 0 Active Problems ICD-10 Encounter Code Description Active Date MDM Diagnosis S81.812A Laceration without foreign  body, left lower leg, initial encounter 02/05/2022 No Yes I73.89 Other specified peripheral vascular diseases 02/05/2022 No Yes I10 Essential (primary) hypertension 02/05/2022 No Yes I25.10 Atherosclerotic heart disease of native coronary artery without angina 02/05/2022 No Yes pectoris Inactive Problems Resolved Problems Electronic Signature(s) Signed: 02/05/2022 10:22:42 AM By: Worthy Keeler PA-C Entered By: Worthy Keeler on 02/05/2022 10:22:42 [name] ([id number]) -------------------------------------------------------------------------------- Progress Note Details Patient Name: [name] Date of Service: 02/05/2022 10:00 AM Medical Record Number: [id number] Patient Account Number: [id number] Date of Birth/Sex: 31-Jul-1927 (88 y.o. F) Treating RN: Cornell Barman Primary Care Provider: Viviana Simpler Other Clinician: Referring Provider: Vladimir Crofts Treating Provider/Extender: Skipper Cliche in Treatment: 0 Subjective Chief Complaint Information obtained from Patient Left anterior LE Laceration History of Present Illness (HPI) 10/21/2020 upon evaluation today patient presents for initial inspection here in our clinic concerning issues that she has been having quite some time in regard to her ankle and since she has been in the hospital with regard to the heel. The ankle in fact has been 5-6 years at least I am told. With that being said the heel ulcer occurred when she was in the hospital in December for hip surgery when she fractured her hip. She also was in the hospital Tuesday for altered  mental status. Really there was nothing that was identified as the cause for this. She did have a. Fortunately there does not appear to be any signs of infection she tells me that she has had she believes arterial studies At [facility] clinic I could not find Those studies at this point. Nonetheless I will continue to look and see what I can find before Then. The patient also has a skin tear on her arm this occurred more recently when she bumped this at home. The patient does have a history of hypertension, coronary artery disease, and peripheral vascular disease stated. 10/28/2020 I was able to find the patient's chart currently which shows that she did have an arterial study performed in May 2019. This showed that she had a abnormal right toe brachial index and a normal left toe brachial index. She was noncompressible as far as ABIs were concerned. She did appear to have triphasic flow at that time. Unfortunately the wound that is commented on in the report that I printed off and read mentions the same wound on the ankle that we are still dealing with at this point. Unfortunately this has not healed. And its been quite sometime about 3 years now. Fortunately there does not appear to be any signs of active infection systemically at this point. I think that the patient has done well with the Santyl over the past week which is good news. Patient's caregiver which is her daughter-in-law is concerned about the fact that she really does not feel qualified to be able to change the dressings and take care of this issue. There does not appear to be any signs of anything untoward going on at this point. I think she is done a great job applying the [employer] and I think that has done a great job for the patient is for soften up some of the necrotic tissue. With that being said I think the Xeroform on the arm also has done excellent. In general I am very pleased with where we stand. And I told the patient's  daughter-in- law as well that I also feel like she has done a great job over the past week taking care of her mother.  11/11/2020 upon evaluation today patient appears to be doing well with regard to her wounds. She has been tolerating the dressing changes without complication her daughters been applying the Santyl which has done a great job. Ho with that being said I think now that we have good arterial study showing we can go ahead and proceed with sharp debridement at this point. 11/25/2020 upon evaluation today patient appears to be doing well with regard to her wounds. The Santyl has really helped to clean things up and overall she is doing quite excellent at this point. Fortunately there does not appear to be any signs of infection which is great news and in general extremely pleased. I do believe some debridement is in order and hopefully will be able to get her into a collagen dressing after this. 12/23/2020 upon evaluation today patient appears to be doing well with regard to her wound all things considered. Unfortunately it does sound like her daughter decided to let this air out because she felt like it was getting so wet. It sounds like she got border gauze dressings instead of border foam therefore there is really Nothing to catch the excess drainage which I think is the problem they were worried about the smell. Fortunately there does not appear to be any signs of active infection which is great news. Nonetheless I do not think we want to leave this just open to air. 01/13/2021 upon evaluation today patient's wounds actually appear to be about as good as have seen since have been taking care of her. Fortunately there does not appear to be any evidence of infection which is great and overall the biggest issue I see is that of fluid buildup. I think we need to do something to try to help manage this. I think if we can control her edema we get the wounds to heal more effectively. 01/27/2021 upon  evaluation today patient appears to be doing well in regard to the wounds on her right lateral malleolus as well as the left heel and the right ischial tuberosity is unfortunately a new area that has arisen since we last saw her. She tells me currently that that is from sitting too much. With that being said this actually appears to be doing the worst of anything so far that I see today. Fortunately there does not appear to be any signs of infection this is at least good news. Compression wrap seem to be doing excellent for her as far as the legs are concerned. 02/03/2021 upon evaluation today patient appears to be doing well with regard to all of her wounds. She has been tolerating the dressing changes without complication. Fortunately there is no signs of active infection at this time. No fevers, chills, nausea, vomiting, or diarrhea. 02/10/2021 upon evaluation today patient appears to be doing well with regard to her wounds. With that being said there is some slight evidence of hypergranulation in regard to the right ankle in particular and a little bit in regard to the left heel. I think Hydrofera Blue might be a better option to go to at this point based on what I am seeing especially since both of these wounds in particular seem to be a little bit stalled. I discussed that with the patient and her daughter today. With regard to the hip this is doing great with the collagen I think will get very close to complete closure I recommend we continue with the collagen. 02/17/2021 upon evaluation today patient appears to be  doing excellent in regard to her heel ulcers. She has been tolerating the dressing changes without complication and overall I am extremely pleased with where things stand today. There does not appear to be any signs of active infection which is great news. Overall I may think that we are headed in the proper direction based on what I am seeing. 02/24/2021 upon evaluation today patient's  wounds actually are showing signs of improvement in regard to her heels both are doing awesome. In [place], [place] ([id number]) regard to her hip location this is actually reopened after being closed last week and to be perfectly honest I am more concerned here about the fact that pressures causing this issue I do not think it has anything to do with her not having the collagen in place again it was completely healed last week there was no reason for the collagen to be there. 03/03/2021 upon evaluation today patient appears to be doing well with regard to her wounds. Everything is showing signs of improvement and doing some much better as far as the overall size of the wound. Fortunately I think we are headed in the right direction. 03/10/2021 upon evaluation today patient appears to be doing well with regard to her wound. She is tolerating the dressing changes without complication. The left heel pretty much appears to be almost completely healed although I think it is probably can be 1 more week before I can call this 100% slough. The right ankle is significantly improved with that being said its not completely closed. With regard to the right hip this is completely closed. Overall I am very pleased with where things stand I think were getting very close to complete resolution. I discussed all this with the patient and her daughter-in-law today. 03/17/2021 upon evaluation today patient appears to be doing well with regard to her wounds. Fortunately there does not appear to be any signs of active infection which is great news and overall very pleased with where the patient stands today. I am in general thankful that overall she is making great progress here. There is good to be a little bit of debridement here to clear away some of the necrotic debris today on both wound locations. 03/24/2021 upon evaluation today patient appears to be doing excellent in regard to her wound. She has been tolerating the  dressing changes without complication. Fortunately there does not appear to be any evidence of infection the left foot is completely healed this is great news. On the right at the ankle region this is still open though I do not think the alginate did quite as well as I was hoping as far as getting this dried out. I think that we may just want to switch back to the [facility] which has done well up to this point. I was just hopeful this would wrap things up completely for [place]. 03/31/2021 upon evaluation today patient appears to be doing well with regard to her wound. This is on the right side which appears to be doing better than last week I think is definitely measuring smaller and this is great news. Overall I am very pleased with where we stand today. I do believe the [facility] is doing better for her. 04/14/2021 upon evaluation today patient appears to be doing well with regard to her wound. I am very pleased with the way it stands I think that she is headed in the right direction. Unfortunately she is having issues with the Tubigrip neither  she nor her daughter-in-law who is the primary caregiver can get this on. Subsequently that means that they have been having a very difficult time with complying with the necessary things for this left leg. With that being said I am really not sure what to do to help in that regard. We can always try a bigger that is larger size Tubigrip but at the same time that means that she may not get as good compression and it may not keep things under control the only way to know is to try. 04/21/2021 upon evaluation today patient appears to be doing well in regard to her wound. She has been tolerating the dressing changes without complication and in fact the right ankle ulcer is actually showing signs of excellent improvement I am very pleased with where things stand today. No fevers, chills, nausea, vomiting, or diarrhea. 04/28/2021 upon evaluation today patient  appears to be doing well with regard to her ankle ulcer. This is actually showing signs of good improvement which is great news and overall very pleased with where we stand today. No fevers, chills, nausea, vomiting, or diarrhea. 05/05/2021 upon evaluation today patient's wound is actually showing signs of significant improvement and overall I am extremely pleased with where we stand today. There does not appear to be any signs of active infection which is great news and overall I am extremely pleased with where we stand at this point. No fevers, chills, nausea, vomiting, or diarrhea. 05/12/2021 upon evaluation today patient appears to be doing okay in regard to her wound is measuring a little bit smaller today that is good news. With that being said she still continues to have significant issues here with an open wound on the lateral aspect of her ankle. Fortunately there does not appear to be any signs of active infection systemically which is great news. No fevers, chills, nausea, vomiting, or diarrhea. 06/13/2021 upon evaluation today patient unfortunately continues to have issues with her right leg. Specifically the ankle region where there is an open wound. Previously the wound on the left leg had completely closed. Nonetheless this left heel has reopened as well. I am going to perform some debridement in regard to the heel ulcer appears to be a fluid-filled blister underneath this area. She has not been here for such a long time due to the fact that she was very sick and they were not able to bring her out. That is the reason we have not seen her in over the past month. 06/20/2021 upon evaluation patient's wounds currently are doing well on the left heel this appears to be healed. There does not appear to be any signs of drainage at this time which is great news. No fevers, chills, nausea, vomiting, or diarrhea. On the right heel there is still an open wound but I feel like we are showing some  signs of improvement this is still good to take a bit of time. 06/27/2021 upon evaluation today patient appears to be doing better in regard to her wound. This is actually showing signs of good improvement which is great news. Fortunately I do not see any signs of infection currently which is great and overall we are finally seeing some actual decrease in the size I think that the compression wraps are making a big difference here. 12/27 some debris on the surface. No evidence of surrounding infection we have been using collagen 07/21/2021 upon evaluation today patient appears to be doing well with regard to her wound.  She has been tolerating the dressing changes without complication. Fortunately I do not see any evidence of active infection locally nor systemically at this time which is great news and overall I think the wound making excellent progress here. 08/04/2021 upon evaluation today patient's wound is showing signs of being a little bit moist and really not significantly smaller. Fortunately I do not see any signs of active infection locally nor systemically at this time that is good news. Nonetheless I do feel like she is doing quite well otherwise and her left leg is still doing awesome. 08/11/2021 upon evaluation today patient appears to be doing well with regard to her wound. She has been tolerating the dressing changes without complication. Fortunately there does not appear to be any signs of active infection at this time. The wound appears to be less macerated though I do believe she was moistening the alginate just a little bit I told her she does not need to do that when putting on a longer she voiced understanding and will not do so any longer. 08/18/2021 upon evaluation today patient's wound appears to be showing signs of being slightly larger compared to previous. We will talk in millimeters but nonetheless this is not headed in the right direction. Subsequently I am not sure exactly  what the best plan will be going forward. I think Sorbact could be a good option willing to give this a try over the next week and see how things go. Unfortunately she does have a new blister on the left heel which has arisen nothing is open and I am hoping it will reabsorb but again that may not be the case. We will have to keep [name], [name]. ([id number]) a close eye on this. 2/17; right lateral malleolus which has been a difficult wound seems to be doing well. Under illumination healthy tissue this is filling in nicely using Sorbact hydrogel and a border foam dressing. Apparently last week she developed a blister on her left medial calcaneus. Most of this is already epithelialized the use Sorbact on this as well. She is developing a threatened area on the medial aspect of her left first metatarsal head this is not open but looks like there is an irritation. They have ordered their adaptive foot wear which are arriving soon 09/01/2021 upon evaluation today patient appears to be doing very well in regard to her wounds in general. I do feel like that she is making excellent progress and overall I am extremely pleased with where we stand today. Fortunately there does not appear to be any evidence of active infection locally nor systemically at this time. Fortunately I do not see any evidence of active infection which is also. 09/08/2021 upon evaluation today patient appears to be doing well with regard to her wounds. She is tolerating the dressing changes without complication. Fortunately I do not see any evidence of active infection locally or systemically which is great news. 09/23/2019 upon evaluation today patient appears to be doing well with regard to her wounds. On the left side this appears to be completely healed on the right side though not completely healed this does seem to be doing significantly better which is great news. No fever chills noted Readmission: 02-05-2022 upon evaluation today  patient presents for reevaluation in the clinic although this is for completely separate issue compared to what I previously saw her for. She sustained a skin tear to the left lower extremity anteriorly which occurred 3 weeks ago. She was seen  in the ER where they subsequently were able to put several sutures and to reattach the skin flap fortunately this has saved her a lot of healing time. Fortunately I do not see any evidence of active infection locally or systemically which is great news. No fevers, chills, nausea, vomiting, or diarrhea. Patient History Information obtained from Patient. Allergies duloxetine, Maxitrol, pregabalin, TobraDex Social History Never smoker, Alcohol Use - Never, Drug Use - No History, Caffeine Use - Never. Medical History Eyes Denies history of Glaucoma Ear/Nose/Mouth/Throat Denies history of Chronic sinus problems/congestion, Middle ear problems Hematologic/Lymphatic Denies history of Anemia, Hemophilia, Human Immunodeficiency Virus, Lymphedema, Sickle Cell Disease Respiratory Denies history of Aspiration, Asthma, Chronic Obstructive Pulmonary Disease (COPD), Pneumothorax, Sleep Apnea, Tuberculosis Cardiovascular Patient has history of Arrhythmia, Coronary Artery Disease, Hypertension, Peripheral Arterial Disease Gastrointestinal Denies history of Cirrhosis , Colitis, Crohn s, Hepatitis A, Hepatitis B, Hepatitis C Endocrine Denies history of Type I Diabetes, Type II Diabetes Genitourinary Denies history of End Stage Renal Disease Immunological Denies history of Lupus Erythematosus, Raynaud s, Scleroderma Integumentary (Skin) Patient has history of History of pressure wounds Denies history of History of Burn Musculoskeletal Patient has history of Osteoarthritis Denies history of Gout, Rheumatoid Arthritis, Osteomyelitis Neurologic Denies history of Dementia, Quadriplegia, Paraplegia, Seizure Disorder Oncologic Denies history of Received  Chemotherapy, Received Radiation Psychiatric Denies history of Anorexia/bulimia, Confinement Anxiety Medical And Surgical History Notes Gastrointestinal GERD Oncologic breast cancer-double mastectomy-1980s [name], [name] ([id number]) Objective Constitutional Well-nourished and well-hydrated in no acute distress. Vitals Time Taken: 10:15 AM, Height: 66 in, Source: Stated, Weight: 85 lbs, Source: Stated, BMI: 13.7, Temperature: 97.8 F, Pulse: 86 bpm, Respiratory Rate: 18 breaths/min, Blood Pressure: 127/69 mmHg. Respiratory normal breathing without difficulty. Psychiatric this patient is able to make decisions and demonstrates good insight into disease process. Alert and Oriented x 3. pleasant and cooperative. General Notes: Upon inspection patient's wound bed actually showed signs of good granulation and epithelization at this point. Fortunately I do not see any evidence of infection which is great news and overall I am extremely pleased with where things stand currently. Integumentary (Hair, Skin) Wound #7 status is Open. Original cause of wound was Trauma. The date acquired was: 01/14/2022. The wound is located on the Left Lower Leg. The wound measures 3.8cm length x 2.5cm width x 0.1cm depth; 7.461cm^2 area and 0.746cm^3 volume. There is Fat Layer (Subcutaneous Tissue) exposed. There is no tunneling or undermining noted. There is a medium amount of serous drainage noted. There is medium (34-66%) pink granulation within the wound bed. There is a medium (34-66%) amount of necrotic tissue within the wound bed including Adherent Slough. Assessment Active Problems ICD-10 Laceration without foreign body, left lower leg, initial encounter Other specified peripheral vascular diseases Essential (primary) hypertension Atherosclerotic heart disease of native coronary artery without angina pectoris Plan Follow-up Appointments: Wound #7 Left Lower Leg: Return Appointment in 2  weeks. Bathing/ Shower/ Hygiene: Wash wounds with antibacterial soap and water. May shower; gently cleanse wound with antibacterial soap, rinse and pat dry prior to dressing wounds Edema Control - Lymphedema / Segmental Compressive Device / Other: Elevate, Exercise Daily and Avoid Standing for Long Periods of Time. Elevate legs to the level of the heart and pump ankles as often as possible Elevate leg(s) parallel to the floor when sitting. WOUND #7: - Lower Leg Wound Laterality: Left Cleanser: Soap and Water Discharge Instructions: Gently cleanse wound with antibacterial soap, rinse and pat dry prior to dressing wounds Topical: triple antibiotic (Generic) Primary  Dressing: Non-Adherent Pad 3x8 (in/in) Discharge Instructions: Add to wound bed to alleviate sticking. Secondary Dressing: Secured With: Coban Cohesive Bandage 4x5 (yds) Stretched (Generic) Discharge Instructions: Apply Coban as directed. [name], [name] ([id number]) 1. I am going to suggest that we go ahead and continue with the wound care measures as before and the patient is in agreement with the plan. This includes the use of the antibiotic ointment followed by nonstick pad that they have been utilizing this seems to be doing a great job. 2. I am also can recommend she continue with roll gauze to secure in place and they are using Coban lightly applied in order to keep the dressing in place which also seems to be doing extremely well for her. We will see patient back for reevaluation in 2 weeks here in the clinic. If anything worsens or changes patient will contact our office for additional recommendations. Electronic Signature(s) Signed: 02/05/2022 5:37:42 PM By: Worthy Keeler PA-C Entered By: Worthy Keeler on 02/05/2022 17:37:42 Ware, [name] ([id number]) -------------------------------------------------------------------------------- ROS/PFSH Details Patient Name: [name], CHEEK Date of Service: 02/05/2022 10:00  AM Medical Record Number: [id number] Patient Account Number: [id number] Date of Birth/Sex: March 30, 1928 (88 y.o. F) Treating RN: Carlene Coria Primary Care Provider: Viviana Simpler Other Clinician: Referring Provider: Vladimir Crofts Treating Provider/Extender: Skipper Cliche in Treatment: 0 Information Obtained From Patient Eyes Medical History: Negative for: Glaucoma Ear/Nose/Mouth/Throat Medical History: Negative for: Chronic sinus problems/congestion; Middle ear problems Hematologic/Lymphatic Medical History: Negative for: Anemia; Hemophilia; Human Immunodeficiency Virus; Lymphedema; Sickle Cell Disease Respiratory Medical History: Negative for: Aspiration; Asthma; Chronic Obstructive Pulmonary Disease (COPD); Pneumothorax; Sleep Apnea; Tuberculosis Cardiovascular Medical History: Positive for: Arrhythmia; Coronary Artery Disease; Hypertension; Peripheral Arterial Disease Gastrointestinal Medical History: Negative for: Cirrhosis ; Colitis; Crohnos; Hepatitis A; Hepatitis B; Hepatitis C Past Medical History Notes: GERD Endocrine Medical History: Negative for: Type I Diabetes; Type II Diabetes Genitourinary Medical History: Negative for: End Stage Renal Disease Immunological Medical History: Negative for: Lupus Erythematosus; Raynaudos; Scleroderma Integumentary (Skin) Medical History: Positive for: History of pressure wounds Negative for: History of Burn Musculoskeletal [name], [name] ([id number]) Medical History: Positive for: Osteoarthritis Negative for: Gout; Rheumatoid Arthritis; Osteomyelitis Neurologic Medical History: Negative for: Dementia; Quadriplegia; Paraplegia; Seizure Disorder Oncologic Medical History: Negative for: Received Chemotherapy; Received Radiation Past Medical History Notes: breast cancer-double mastectomy-1980s Psychiatric Medical History: Negative for: Anorexia/bulimia; Confinement Anxiety Immunizations Pneumococcal  Vaccine: Received Pneumococcal Vaccination: Yes Received Pneumococcal Vaccination On or After 60th Birthday: Yes Implantable Devices None Family and Social History Never smoker; Alcohol Use: Never; Drug Use: No History; Caffeine Use: Never Electronic Signature(s) Signed: 02/05/2022 4:47:26 PM By: Carlene Coria RN Signed: 02/05/2022 5:40:09 PM By: Worthy Keeler PA-C Entered By: Carlene Coria on 02/05/2022 10:17:06 Wessler, [name] ([id number]) -------------------------------------------------------------------------------- SuperBill Details Patient Name: AROHI, SALVATIERRA Date of Service: 02/05/2022 Medical Record Number: [id number] Patient Account Number: [id number] Date of Birth/Sex: Sep 12, 1927 (88 y.o. F) Treating RN: Cornell Barman Primary Care Provider: Viviana Simpler Other Clinician: Referring Provider: Vladimir Crofts Treating Provider/Extender: Jeri Cos Weeks in Treatment: 0 Diagnosis Coding ICD-10 Codes Code Description [phone] Laceration without foreign body, left lower leg, initial encounter I73.89 Other specified peripheral vascular diseases I10 Essential (primary) hypertension I25.10 Atherosclerotic heart disease of native coronary artery without angina pectoris Facility Procedures CPT4 Code: [id number] Description: 99213 - WOUND CARE VISIT-LEV 3 EST PT Modifier: Quantity: 1 Physician Procedures CPT4 Code: [id number] Description: 99214 - WC PHYS LEVEL 4 - EST PT Modifier: Quantity: 1 CPT4 Code: Description:  ICD-10 Diagnosis Description S81.812A Laceration without foreign body, left lower leg, initial encounter I73.89 Other specified peripheral vascular diseases I10 Essential (primary) hypertension 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

## 2022-02-05 NOTE — Progress Notes (Signed)
[name], [name] ([id number]) Visit Report for 02/05/2022 Allergy List Details Patient Name: [name], [name]. Date of Service: 02/05/2022 10:00 AM Medical Record Number: [id number] Patient Account Number: [id number] Date of Birth/Sex: February 13, 1928 (88 y.o. F) Treating RN: Carlene Coria Primary Care Marshell Rieger: Viviana Simpler Other Clinician: Referring Jaskiran Pata: Vladimir Crofts Treating Anara Cowman/Extender: Jeri Cos Weeks in Treatment: 0 Allergies Active Allergies duloxetine Maxitrol pregabalin TobraDex Allergy Notes Electronic Signature(s) Signed: 02/05/2022 4:47:26 PM By: Carlene Coria RN Entered By: Carlene Coria on 02/05/2022 10:16:48 [name] ([id number]) -------------------------------------------------------------------------------- Arrival Information Details Patient Name: [name], [name] Date of Service: 02/05/2022 10:00 AM Medical Record Number: [id number] Patient Account Number: [id number] Date of Birth/Sex: Nov 30, 1927 (88 y.o. F) Treating RN: Carlene Coria Primary Care Asuncion Shibata: Viviana Simpler Other Clinician: Referring Allisen Pidgeon: Vladimir Crofts Treating Jaysin Gayler/Extender: Skipper Cliche in Treatment: 0 Visit Information Patient Arrived: [name] Arrival Time: 10:06 Accompanied By: daughter Transfer Assistance: None Patient Identification Verified: Yes Secondary Verification Process Completed: Yes Patient Requires Transmission-Based Precautions: No Patient Has Alerts: No History Since Last Visit All ordered tests and consults were completed: No Added or deleted any medications: No Any new allergies or adverse reactions: No Had a fall or experienced change in activities of daily living that may affect risk of falls: No Signs or symptoms of abuse/neglect since last visito No Hospitalized since last visit: No Implantable device outside of the clinic excluding cellular tissue based products placed in the center since last visit: No Has Dressing in Place as Prescribed:  Yes Electronic Signature(s) Signed: 02/05/2022 4:47:26 PM By: Carlene Coria RN Entered By: Carlene Coria on 02/05/2022 10:15:20 [name] ([id number]) -------------------------------------------------------------------------------- Clinic Level of Care Assessment Details Patient Name: [name] Date of Service: 02/05/2022 10:00 AM Medical Record Number: [id number] Patient Account Number: [id number] Date of Birth/Sex: [birth year] (88 y.o. F) Treating RN: Cornell Barman Primary Care Elzada Pytel: Viviana Simpler Other Clinician: Referring Mandeep Ferch: Vladimir Crofts Treating Hodge Stachnik/Extender: Skipper Cliche in Treatment: 0 Clinic Level of Care Assessment Items TOOL 4 Quantity Score '[]'$  - Use when only an EandM is performed on FOLLOW-UP visit 0 ASSESSMENTS - Nursing Assessment / Reassessment X - Reassessment of Co-morbidities (includes updates in patient status) 1 10 X- 1 5 Reassessment of Adherence to Treatment Plan ASSESSMENTS - Wound and Skin Assessment / Reassessment X - Simple Wound Assessment / Reassessment - one wound 1 5 '[]'$  - 0 Complex Wound Assessment / Reassessment - multiple wounds '[]'$  - 0 Dermatologic / Skin Assessment (not related to wound area) ASSESSMENTS - Focused Assessment '[]'$  - Circumferential Edema Measurements - multi extremities 0 '[]'$  - 0 Nutritional Assessment / Counseling / Intervention '[]'$  - 0 Lower Extremity Assessment (monofilament, tuning fork, pulses) '[]'$  - 0 Peripheral Arterial Disease Assessment (using hand held doppler) ASSESSMENTS - Ostomy and/or Continence Assessment and Care '[]'$  - Incontinence Assessment and Management 0 '[]'$  - 0 Ostomy Care Assessment and Management (repouching, etc.) PROCESS - Coordination of Care X - Simple Patient / Family Education for ongoing care 1 15 '[]'$  - 0 Complex (extensive) Patient / Family Education for ongoing care X- 1 10 Staff obtains Programmer, systems, Records, Test Results / Process Orders '[]'$  - 0 Staff telephones HHA,  Nursing Homes / Clarify orders / etc '[]'$  - 0 Routine Transfer to another Facility (non-emergent condition) '[]'$  - 0 Routine Hospital Admission (non-emergent condition) X- 1 15 New Admissions / Biomedical engineer / Ordering NPWT, Apligraf, etc. '[]'$  - 0 Emergency Hospital Admission (emergent condition) '[]'$  - 0 Simple Discharge Coordination '[]'$  - 0  Complex (extensive) Discharge Coordination PROCESS - Special Needs '[]'$  - Pediatric / Minor Patient Management 0 '[]'$  - 0 Isolation Patient Management '[]'$  - 0 Hearing / Language / Visual special needs '[]'$  - 0 Assessment of Community assistance (transportation, D/C planning, etc.) '[]'$  - 0 Additional assistance / Altered mentation '[]'$  - 0 Support Surface(s) Assessment (bed, cushion, seat, etc.) INTERVENTIONS - Wound Cleansing / Measurement [name], [name] ([id number]) X- 1 5 Simple Wound Cleansing - one wound '[]'$  - 0 Complex Wound Cleansing - multiple wounds X- 1 5 Wound Imaging (photographs - any number of wounds) '[]'$  - 0 Wound Tracing (instead of photographs) X- 1 5 Simple Wound Measurement - one wound '[]'$  - 0 Complex Wound Measurement - multiple wounds INTERVENTIONS - Wound Dressings '[]'$  - Small Wound Dressing one or multiple wounds 0 X- 1 15 Medium Wound Dressing one or multiple wounds '[]'$  - 0 Large Wound Dressing one or multiple wounds '[]'$  - 0 Application of Medications - topical '[]'$  - 0 Application of Medications - injection INTERVENTIONS - Miscellaneous '[]'$  - External ear exam 0 '[]'$  - 0 Specimen Collection (cultures, biopsies, blood, body fluids, etc.) '[]'$  - 0 Specimen(s) / Culture(s) sent or taken to Lab for analysis '[]'$  - 0 Patient Transfer (multiple staff / Civil Service fast streamer / Similar devices) '[]'$  - 0 Simple Staple / Suture removal (25 or less) '[]'$  - 0 Complex Staple / Suture removal (26 or more) '[]'$  - 0 Hypo / Hyperglycemic Management (close monitor of Blood Glucose) X- 1 15 Ankle / Brachial Index (ABI) - do not check if billed  separately X- 1 5 Vital Signs Has the patient been seen at the hospital within the last three years: Yes Total Score: 110 Level Of Care: New/Established - Level 3 Electronic Signature(s) Signed: 02/05/2022 5:09:07 PM By: Gretta Cool, BSN, RN, CWS, Kim RN, BSN Entered By: Gretta Cool, BSN, RN, CWS, Kim on 02/05/2022 10:52:54 [name] ([id number]) -------------------------------------------------------------------------------- Encounter Discharge Information Details Patient Name: [name], [name] Date of Service: 02/05/2022 10:00 AM Medical Record Number: [id number] Patient Account Number: [id number] Date of Birth/Sex: 1928/06/21 (88 y.o. F) Treating RN: Cornell Barman Primary Care Monie Shere: Viviana Simpler Other Clinician: Referring Judith Campillo: Vladimir Crofts Treating Cobie Marcoux/Extender: Skipper Cliche in Treatment: 0 Encounter Discharge Information Items Discharge Condition: Stable Ambulatory Status: Walker Discharge Destination: Home Transportation: Private Auto Accompanied By: Daughter in law Schedule Follow-up Appointment: Yes Clinical Summary of Care: Electronic Signature(s) Signed: 02/05/2022 5:09:07 PM By: Gretta Cool, BSN, RN, CWS, Kim RN, BSN Entered By: Gretta Cool, BSN, RN, CWS, Kim on 02/05/2022 11:02:08 [name] ([id number]) -------------------------------------------------------------------------------- Lower Extremity Assessment Details Patient Name: [name], [name] Date of Service: 02/05/2022 10:00 AM Medical Record Number: [id number] Patient Account Number: [id number] Date of Birth/Sex: Nov 12, 1927 (88 y.o. F) Treating RN: Carlene Coria Primary Care Mikita Lesmeister: Viviana Simpler Other Clinician: Referring Deontray Hunnicutt: Vladimir Crofts Treating Jeane Cashatt/Extender: Jeri Cos Weeks in Treatment: 0 Vascular Assessment Pulses: Dorsalis Pedis Palpable: [Left:Yes] [Right:Yes] Doppler Audible: [Left:Yes] [Right:Yes] Posterior Tibial Palpable: [Left:Yes] [Right:Yes] Doppler Audible:  [Left:Yes] [Right:Yes] Blood Pressure: Brachial: [Left:125] Ankle: [Left:Dorsalis Pedis: 169 1.35] [Right:Dorsalis Pedis: 152 1.22] Electronic Signature(s) Signed: 02/05/2022 4:47:26 PM By: Carlene Coria RN Entered By: Carlene Coria on 02/05/2022 10:37:43 Hilbun, [name] ([id number]) -------------------------------------------------------------------------------- Multi Wound Chart Details Patient Name: [name] Date of Service: 02/05/2022 10:00 AM Medical Record Number: [id number] Patient Account Number: [id number] Date of Birth/Sex: [birth year] (88 y.o. F) Treating RN: Cornell Barman Primary Care Traves Majchrzak: Viviana Simpler Other Clinician: Referring Kierrah Kilbride: Vladimir Crofts Treating Wyeth Hoffer/Extender: Jeri Cos  Weeks in Treatment: 0 Vital Signs Height(in): 66 Pulse(bpm): 86 Weight(lbs): 85 Blood Pressure(mmHg): 127/69 Body Mass Index(BMI): 13.7 Temperature(F): 97.8 Respiratory Rate(breaths/min): 18 Photos: [N/A:N/A] Wound Location: Left Lower Leg N/A N/A Wounding Event: Trauma N/A N/A Primary Etiology: Trauma, Other N/A N/A Comorbid History: Arrhythmia, Coronary Artery N/A N/A Disease, Hypertension, Peripheral Arterial Disease, History of pressure wounds, Osteoarthritis Date Acquired: 01/14/2022 N/A N/A Weeks of Treatment: 0 N/A N/A Wound Status: Open N/A N/A Wound Recurrence: No N/A N/A Measurements L x W x D (cm) 3.8x2.5x0.1 N/A N/A Area (cm) : 7.461 N/A N/A Volume (cm) : 0.746 N/A N/A % Reduction in Area: 0.00% N/A N/A % Reduction in Volume: 0.00% N/A N/A Classification: Full Thickness Without Exposed N/A N/A Support Structures Exudate Amount: Medium N/A N/A Exudate Type: Serous N/A N/A Exudate Color: amber N/A N/A Granulation Amount: Medium (34-66%) N/A N/A Granulation Quality: Pink N/A N/A Necrotic Amount: Medium (34-66%) N/A N/A Exposed Structures: Fat Layer (Subcutaneous Tissue): N/A N/A Yes Fascia: No Tendon: No Muscle: No Joint: No Bone:  No Epithelialization: Medium (34-66%) N/A N/A Treatment Notes Electronic Signature(s) Signed: 02/05/2022 5:09:07 PM By: Gretta Cool, BSN, RN, CWS, Kim RN, BSN Entered By: Gretta Cool, BSN, RN, CWS, Kim on 02/05/2022 10:49:56 Keast, [name] ([id number]) -------------------------------------------------------------------------------- [place] Details Patient Name: [name], [name]. Date of Service: 02/05/2022 10:00 AM Medical Record Number: [id number] Patient Account Number: [id number] Date of Birth/Sex: [birth year] (88 y.o. F) Treating RN: Cornell Barman Primary Care Haruko Mersch: Viviana Simpler Other Clinician: Referring Dayelin Balducci: Vladimir Crofts Treating Romanda Turrubiates/Extender: Skipper Cliche in Treatment: 0 Active Inactive Necrotic Tissue Nursing Diagnoses: Impaired tissue integrity related to necrotic/devitalized tissue Knowledge deficit related to management of necrotic/devitalized tissue Goals: Necrotic/devitalized tissue will be minimized in the wound bed Date Initiated: 02/05/2022 Target Resolution Date: 02/05/2022 Goal Status: Active Patient/caregiver will verbalize understanding of reason and process for debridement of necrotic tissue Date Initiated: 02/05/2022 Target Resolution Date: 02/05/2022 Goal Status: Active Interventions: Assess patient pain level pre-, during and post procedure and prior to discharge Provide education on necrotic tissue and debridement process Treatment Activities: Excisional debridement : 02/05/2022 Notes: Orientation to the Wound Care Program Nursing Diagnoses: Knowledge deficit related to the wound healing center program Goals: Patient/caregiver will verbalize understanding of the [place] Date Initiated: 02/05/2022 Target Resolution Date: 02/05/2022 Goal Status: Active Interventions: Provide education on orientation to the wound center Notes: Soft Tissue Infection Nursing Diagnoses: Impaired tissue integrity Knowledge  deficit related to disease process and management Knowledge deficit related to home infection control: handwashing, handling of soiled dressings, supply storage Potential for infection: soft tissue Goals: Patient/caregiver will verbalize understanding of or measures to prevent infection and contamination in the home setting Date Initiated: 02/05/2022 Target Resolution Date: 02/05/2022 Goal Status: Active Patient's soft tissue infection will resolve Date Initiated: 02/05/2022 Target Resolution Date: 02/05/2022 Goal Status: Active Signs and symptoms of infection will be recognized early to allow for prompt treatment [name], [name] ([id number]) Date Initiated: 02/05/2022 Target Resolution Date: 02/05/2022 Goal Status: Active Interventions: Assess signs and symptoms of infection every visit Provide education on infection Notes: Wound/Skin Impairment Nursing Diagnoses: Impaired tissue integrity Goals: Patient/caregiver will verbalize understanding of skin care regimen Date Initiated: 02/05/2022 Target Resolution Date: 02/05/2022 Goal Status: Active Ulcer/skin breakdown will have a volume reduction of 30% by week 4 Date Initiated: 02/05/2022 Target Resolution Date: 03/05/2022 Goal Status: Active Interventions: Assess ulceration(s) every visit Treatment Activities: Skin care regimen initiated : 02/05/2022 Notes: Electronic Signature(s) Signed: 02/05/2022 5:09:07 PM By: Gretta Cool,  BSN, RN, CWS, Kim RN, BSN Entered By: Gretta Cool, BSN, RN, CWS, Kim on 02/05/2022 10:49:33 [name], [name] ([id number]) -------------------------------------------------------------------------------- Pain Assessment Details Patient Name: [name], [name] Date of Service: 02/05/2022 10:00 AM Medical Record Number: [id number] Patient Account Number: [id number] Date of Birth/Sex: Nov 30, 1927 (88 y.o. F) Treating RN: Carlene Coria Primary Care Providencia Hottenstein: Viviana Simpler Other Clinician: Referring Emerald Shor: Vladimir Crofts Treating Yanelli Zapanta/Extender: Skipper Cliche in Treatment: 0 Active Problems Location of Pain Severity and Description of Pain Patient Has Paino No Site Locations Pain Management and Medication Current Pain Management: Electronic Signature(s) Signed: 02/05/2022 4:47:26 PM By: Carlene Coria RN Entered By: Carlene Coria on 02/05/2022 10:15:48 [name] ([id number]) -------------------------------------------------------------------------------- Patient/Caregiver Education Details Patient Name: [name], [name] Date of Service: 02/05/2022 10:00 AM Medical Record Number: [id number] Patient Account Number: [id number] Date of Birth/Gender: Jul 20, 1927 (88 y.o. F) Treating RN: Cornell Barman Primary Care Physician: Viviana Simpler Other Clinician: Referring Physician: Vladimir Crofts Treating Physician/Extender: Skipper Cliche in Treatment: 0 Education Assessment Education Provided To: Patient Education Topics Provided Infection: Handouts: Infection Prevention and Management Methods: Demonstration, Explain/Verbal Responses: State content correctly Welcome To [facility] [place]: Handouts: Welcome To [facility] [place] Methods: Demonstration, Explain/Verbal Responses: State content correctly Electronic Signature(s) Signed: 02/05/2022 5:09:07 PM By: Gretta Cool, BSN, RN, CWS, Kim RN, BSN Entered By: Gretta Cool, BSN, RN, CWS, Kim on 02/05/2022 11:01:22 [name] ([id number]) -------------------------------------------------------------------------------- Wound Assessment Details Patient Name: AREYANNA, FIGEROA Date of Service: 02/05/2022 10:00 AM Medical Record Number: [id number] Patient Account Number: [id number] Date of Birth/Sex: 11/01/1927 (88 y.o. F) Treating RN: Carlene Coria Primary Care Levy Cedano: Viviana Simpler Other Clinician: Referring Marvena Tally: Vladimir Crofts Treating Holly Iannaccone/Extender: Jeri Cos Weeks in Treatment: 0 Wound Status Wound Number: 7 Primary Trauma,  Other Etiology: Wound Location: Left Lower Leg Wound Open Wounding Event: Trauma Status: Date Acquired: 01/14/2022 Comorbid Arrhythmia, Coronary Artery Disease, Hypertension, Weeks Of Treatment: 0 History: Peripheral Arterial Disease, History of pressure wounds, Clustered Wound: No Osteoarthritis Photos Wound Measurements Length: (cm) 3.8 Width: (cm) 2.5 Depth: (cm) 0.1 Area: (cm) 7.461 Volume: (cm) 0.746 % Reduction in Area: 0% % Reduction in Volume: 0% Epithelialization: Medium (34-66%) Tunneling: No Undermining: No Wound Description Classification: Full Thickness Without Exposed Support Structures Exudate Amount: Medium Exudate Type: Serous Exudate Color: amber Foul Odor After Cleansing: No Slough/Fibrino Yes Wound Bed Granulation Amount: Medium (34-66%) Exposed Structure Granulation Quality: Pink Fascia Exposed: No Necrotic Amount: Medium (34-66%) Fat Layer (Subcutaneous Tissue) Exposed: Yes Necrotic Quality: Adherent Slough Tendon Exposed: No Muscle Exposed: No Joint Exposed: No Bone Exposed: No Treatment Notes Wound #7 (Lower Leg) Wound Laterality: Left Cleanser Soap and Water Discharge Instruction: Gently cleanse wound with antibacterial soap, rinse and pat dry prior to dressing wounds Peri-Wound Care EARLA, CHARLIE ([id number]) Topical triple antibiotic Primary Dressing Non-Adherent Pad 3x8 (in/in) Discharge Instruction: Add to wound bed to alleviate sticking. Secondary Dressing Secured With Coban Cohesive Bandage 4x5 (yds) Stretched Discharge Instruction: Apply Coban as directed. Compression Wrap Compression Stockings Add-Ons Electronic Signature(s) Signed: 02/05/2022 4:47:26 PM By: Carlene Coria RN Entered By: Carlene Coria on 02/05/2022 10:29:55 [name] ([id number]) -------------------------------------------------------------------------------- Vitals Details Patient Name: [name] Date of Service: 02/05/2022 10:00  AM Medical Record Number: [id number] Patient Account Number: [id number] Date of Birth/Sex: 1927/10/09 (88 y.o. F) Treating RN: Carlene Coria Primary Care Remberto Lienhard: Viviana Simpler Other Clinician: Referring Carly Sabo: Vladimir Crofts Treating Kole Hilyard/Extender: Skipper Cliche in Treatment: 0 Vital Signs Time Taken: 10:15 Temperature (F): 97.8 Height (in): 66 Pulse (  bpm): 86 Source: Stated Respiratory Rate (breaths/min): 18 Weight (lbs): 85 Blood Pressure (mmHg): 127/69 Source: Stated Reference Range: 80 - 120 mg / dl Body Mass Index (BMI): 13.7 Electronic Signature(s) Signed: 02/05/2022 4:47:26 PM By: Carlene Coria RN Entered By: Carlene Coria on 02/05/2022 10:16:35

## 2022-02-05 NOTE — Progress Notes (Signed)
[name], [name] ([id number]) Visit Report for 02/05/2022 Abuse Risk Screen Details Patient Name: [name], [name]. Date of Service: 02/05/2022 10:00 AM Medical Record Number: [id number] Patient Account Number: [id number] Date of Birth/Sex: 05-26-1928 (88 y.o. F) Treating RN: [name] Primary Care [name]: [name] Other Clinician: Referring [name]: [name] Treating [name]/Extender: [name] in Treatment: 0 Abuse Risk Screen Items Answer ABUSE RISK SCREEN: Has anyone close to you tried to hurt or harm you recentlyo No Do you feel uncomfortable with anyone in your familyo No Has anyone forced you do things that you didnot want to doo No Electronic Signature(s) Signed: 02/05/2022 4:47:26 PM By: Elizabeth Coria RN Entered By: [name] on 02/05/2022 10:17:12 [name], [name] ([id number]) -------------------------------------------------------------------------------- Activities of Daily Living Details Patient Name: [name], [name] Date of Service: 02/05/2022 10:00 AM Medical Record Number: [id number] Patient Account Number: [id number] Date of Birth/Sex: [birth year] (88 y.o. F) Treating RN: [name] Primary Care [name]: [name] Other Clinician: Referring [name]: [name] Treating [name]/Extender: [name] in Treatment: 0 Activities of Daily Living Items Answer Activities of Daily Living (Please select one for each item) Drive Automobile Not Able Take Medications Need Assistance Use Telephone Need Assistance Care for Appearance Need Assistance Use Toilet Need Assistance Bath / Shower Need Assistance Dress Self Need Assistance Feed Self Completely Able Walk Need Assistance Get In / Out Bed Need Assistance Housework Not Able Prepare Meals Need Assistance Handle Money Not Able Shop for Self Need Assistance Electronic Signature(s) Signed: 02/05/2022 4:47:26 PM By: Elizabeth Coria RN Entered By: [name] on 02/05/2022  10:18:01 [name] ([id number]) -------------------------------------------------------------------------------- Education Screening Details Patient Name: [name], [name] Date of Service: 02/05/2022 10:00 AM Medical Record Number: [id number] Patient Account Number: [id number] Date of Birth/Sex: Jul 10, 1927 (88 y.o. F) Treating RN: [name] Primary Care [name]: [name] Other Clinician: Referring [name]: [name] Treating [name]/Extender: [name] in Treatment: 0 Primary Learner Assessed: Patient Learning Preferences/Education Level/Primary Language Learning Preference: Explanation Highest Education Level: High School Preferred Language: English Cognitive Barrier Language Barrier: No Translator Needed: No Memory Deficit: No Emotional Barrier: No Cultural/Religious Beliefs Affecting Medical Care: No Physical Barrier Impaired Vision: Yes Glasses Impaired Hearing: No Decreased Hand dexterity: No Knowledge/Comprehension Knowledge Level: Medium Comprehension Level: High Ability to understand written instructions: High Ability to understand verbal instructions: High Motivation Anxiety Level: Anxious Cooperation: Cooperative Education Importance: Acknowledges Need Interest in Health Problems: Asks Questions Perception: Coherent Willingness to Engage in Self-Management High Activities: Readiness to Engage in Self-Management High Activities: Electronic Signature(s) Signed: 02/05/2022 4:47:26 PM By: Elizabeth Coria RN Entered By: [name] on 02/05/2022 10:18:32 [name] ([id number]) -------------------------------------------------------------------------------- Fall Risk Assessment Details Patient Name: [name] Date of Service: 02/05/2022 10:00 AM Medical Record Number: [id number] Patient Account Number: [id number] Date of Birth/Sex: 01-16-1928 (88 y.o. F) Treating RN: [name] Primary Care [name]: [name] Other  Clinician: Referring [name]: [name] Treating [name]/Extender: [name] in Treatment: 0 Fall Risk Assessment Items Have you had 2 or more falls in the last 12 monthso 0 Yes Have you had any fall that resulted in injury in the last 12 monthso 0 Yes FALLS RISK SCREEN History of falling - immediate or within 3 months 25 Yes Secondary diagnosis (Do you have 2 or more medical diagnoseso) 0 No Ambulatory aid None/bed rest/wheelchair/nurse 0 No Crutches/cane/walker 0 No Furniture 0 No Intravenous therapy Access/Saline/Heparin Lock 0 No Gait/Transferring Normal/ bed rest/ wheelchair 0 No Weak (short steps with  or without shuffle, stooped but able to lift head while walking, may 0 No seek support from furniture) Impaired (short steps with shuffle, may have difficulty arising from chair, head down, impaired 0 No balance) Mental Status Oriented to own ability 0 No Electronic Signature(s) Signed: 02/05/2022 4:47:26 PM By: Elizabeth Coria RN Entered By: [name] on 02/05/2022 10:18:48 [name] ([id number]) -------------------------------------------------------------------------------- Foot Assessment Details Patient Name: [name], [name] Date of Service: 02/05/2022 10:00 AM Medical Record Number: [id number] Patient Account Number: [id number] Date of Birth/Sex: [birth year] (88 y.o. F) Treating RN: [name] Primary Care [name]: [name] Other Clinician: Referring [name]: [name] Treating [name]/Extender: [name] Weeks in Treatment: 0 Foot Assessment Items Site Locations + = Sensation present, - = Sensation absent, C = Callus, U = Ulcer R = Redness, W = Warmth, M = Maceration, PU = Pre-ulcerative lesion F = Fissure, S = Swelling, D = Dryness Assessment Right: Left: Other Deformity: No No Prior Foot Ulcer: No No Prior Amputation: No No Charcot Joint: No No Ambulatory Status: Ambulatory Without Help Gait: Steady Electronic  Signature(s) Signed: 02/05/2022 4:47:26 PM By: Elizabeth Coria RN Entered By: [name] on 02/05/2022 10:21:55 [name] ([id number]) -------------------------------------------------------------------------------- Nutrition Risk Screening Details Patient Name: [name] Date of Service: 02/05/2022 10:00 AM Medical Record Number: [id number] Patient Account Number: [id number] Date of Birth/Sex: 16-Apr-1928 (88 y.o. F) Treating RN: [name] Primary Care Niasia Lanphear: [name] Other Clinician: Referring Markcus Lazenby: [name] Treating Rik Wadel/Extender: [name] Weeks in Treatment: 0 Height (in): 66 Weight (lbs): 85 Body Mass Index (BMI): 13.7 Nutrition Risk Screening Items Score Screening NUTRITION RISK SCREEN: I have an illness or condition that made me change the kind and/or amount of food I eat 0 No I eat fewer than two meals per day 0 No I eat few fruits and vegetables, or milk products 0 No I have three or more drinks of beer, liquor or wine almost every day 0 No I have tooth or mouth problems that make it hard for me to eat 0 No I don't always have enough money to buy the food I need 0 No I eat alone most of the time 0 No I take three or more different prescribed or over-the-counter drugs a day 1 Yes Without wanting to, I have lost or gained 10 pounds in the last six months 0 No I am not always physically able to shop, cook and/or feed myself 2 Yes Nutrition Protocols Good Risk Protocol Moderate Risk Protocol 0 Provide education on nutrition High Risk Proctocol Risk Level: Moderate Risk Score: 3 Electronic Signature(s) Signed: 02/05/2022 4:47:26 PM By: Elizabeth Coria RN Entered By: [name] on 02/05/2022 10:18:58

## 2022-02-12 ENCOUNTER — Other Ambulatory Visit: Payer: Self-pay | Admitting: Internal Medicine

## 2022-02-12 NOTE — Telephone Encounter (Signed)
Name of Medication: MSIR Name of [facility]acy: [facility] or Written Date and Quantity: 12-06-21 390 Last Office Visit and Type: 11-20-21 Next Office Visit and Type: 02-26-22 Last Controlled Substance Agreement Date: 09-29-20 Last UDS: 04-27-19

## 2022-02-20 ENCOUNTER — Encounter: Payer: PPO | Attending: Physician Assistant | Admitting: Physician Assistant

## 2022-02-20 DIAGNOSIS — I7389 Other specified peripheral vascular diseases: Secondary | ICD-10-CM | POA: Insufficient documentation

## 2022-02-20 DIAGNOSIS — S81812A Laceration without foreign body, left lower leg, initial encounter: Secondary | ICD-10-CM | POA: Diagnosis not present

## 2022-02-20 DIAGNOSIS — X58XXXA Exposure to other specified factors, initial encounter: Secondary | ICD-10-CM | POA: Insufficient documentation

## 2022-02-20 DIAGNOSIS — L97822 Non-pressure chronic ulcer of other part of left lower leg with fat layer exposed: Secondary | ICD-10-CM | POA: Diagnosis not present

## 2022-02-20 DIAGNOSIS — I10 Essential (primary) hypertension: Secondary | ICD-10-CM | POA: Diagnosis not present

## 2022-02-20 DIAGNOSIS — I2510 Atherosclerotic heart disease of native coronary artery without angina pectoris: Secondary | ICD-10-CM | POA: Insufficient documentation

## 2022-02-22 NOTE — Progress Notes (Addendum)
LENNYN, GANGE ([id number]) Visit Report for 02/20/2022 Chief Complaint Document Details Patient Name: [name], [name]. Date of Service: 02/20/2022 3:00 PM Medical Record Number: [id number] Patient Account Number: [id number] Date of Birth/Sex: 07/01/1928 (88 y.o. F) Treating RN: Cornell Barman Primary Care Provider: Viviana Simpler Other Clinician: Massie Kluver Referring Provider: Viviana Simpler Treating Provider/Extender: Skipper Cliche in Treatment: 2 Information Obtained from: Patient Chief Complaint Left anterior LE Laceration Electronic Signature(s) Signed: 02/20/2022 3:11:49 PM By: Worthy Keeler PA-C Entered By: Worthy Keeler on 02/20/2022 15:11:49 Hoxworth, [name] ([id number]) -------------------------------------------------------------------------------- HPI Details Patient Name: [name], [name] Date of Service: 02/20/2022 3:00 PM Medical Record Number: [id number] Patient Account Number: [id number] Date of Birth/Sex: [birth year] (88 y.o. F) Treating RN: Cornell Barman Primary Care Provider: Viviana Simpler Other Clinician: Massie Kluver Referring Provider: Viviana Simpler Treating Provider/Extender: Skipper Cliche in Treatment: 2 History of Present Illness HPI Description: 10/21/2020 upon evaluation today patient presents for initial inspection here in our clinic concerning issues that she has been having quite some time in regard to her ankle and since she has been in the hospital with regard to the heel. The ankle in fact has been 5-6 years at least I am told. With that being said the heel ulcer occurred when she was in the hospital in December for hip surgery when she fractured her hip. She also was in the hospital Tuesday for altered mental status. Really there was nothing that was identified as the cause for this. She did have a. Fortunately there does not appear to be any signs of infection she tells me that she has had she believes arterial studies At [facility]  clinic I could not find Those studies at this point. Nonetheless I will continue to look and see what I can find before Then. The patient also has a skin tear on her arm this occurred more recently when she bumped this at home. The patient does have a history of hypertension, coronary artery disease, and peripheral vascular disease stated. 10/28/2020 I was able to find the patient's chart currently which shows that she did have an arterial study performed in May 2019. This showed that she had a abnormal right toe brachial index and a normal left toe brachial index. She was noncompressible as far as ABIs were concerned. 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 Unfortunately the wound that is commented on in the report that I printed off and read mentions the same wound on the ankle that we are still dealing with at this point. Unfortunately this has not healed. And its been quite sometime about 3 years now. Fortunately there does not appear to be any signs of active infection systemically at this point. I think that the patient has done well with the Santyl over the past week which is good news. Patient's caregiver which is her daughter-in-law is concerned about the fact that she really does not feel qualified to be able to change the dressings and take care of this issue. There does not appear to be any signs of anything untoward going on at this point. I think she is done a great job applying the [employer] and I think that has done a great job for the patient is for soften up some of the necrotic tissue. With that being said I think the Xeroform on the arm also has done excellent. In general I am very pleased with where we stand. And I told the patient's daughter-in- law as well  that I also feel like she has done a great job over the past week taking care of her mother. 11/11/2020 upon evaluation today patient appears to be doing well with regard to her wounds. She has been tolerating the  dressing changes without complication her daughters been applying the Santyl which has done a great job. Ho with that being said I think now that we have good arterial study showing we can go ahead and proceed with sharp debridement at this point. 11/25/2020 upon evaluation today patient appears to be doing well with regard to her wounds. The Santyl has really helped to clean things up and overall she is doing quite excellent at this point. Fortunately there does not appear to be any signs of infection which is great news and in general extremely pleased. I do believe some debridement is in order and hopefully will be able to get her into a collagen dressing after this. 12/23/2020 upon evaluation today patient appears to be doing well with regard to her wound all things considered. Unfortunately it does sound like her daughter decided to let this air out because she felt like it was getting so wet. It sounds like she got border gauze dressings instead of border foam therefore there is really Nothing to catch the excess drainage which I think is the problem they were worried about the smell. Fortunately there does not appear to be any signs of active infection which is great news. Nonetheless I do not think we want to leave this just open to air. 01/13/2021 upon evaluation today patient's wounds actually appear to be about as good as have seen since have been taking care of her. Fortunately there does not appear to be any evidence of infection which is great and overall the biggest issue I see is that of fluid buildup. I think we need to do something to try to help manage this. I think if we can control her edema we get the wounds to heal more effectively. 01/27/2021 upon evaluation today patient appears to be doing well in regard to the wounds on her right lateral malleolus as well as the left heel and the right ischial tuberosity is unfortunately a new area that has arisen since we last saw her. She tells  me currently that that is from sitting too much. With that being said this actually appears to be doing the worst of anything so far that I see today. Fortunately there does not appear to be any signs of infection this is at least good news. Compression wrap seem to be doing excellent for her as far as the legs are concerned. 02/03/2021 upon evaluation today patient appears to be doing well with regard to all of her wounds. She has been tolerating the dressing changes without complication. Fortunately there is no signs of active infection at this time. No fevers, chills, nausea, vomiting, or diarrhea. 02/10/2021 upon evaluation today patient appears to be doing well with regard to her wounds. With that being said there is some slight evidence of hypergranulation in regard to the right ankle in particular and a little bit in regard to the left heel. I think Hydrofera Blue might be a better option to go to at this point based on what I am seeing especially since both of these wounds in particular seem to be a little bit stalled. I discussed that with the patient and her daughter today. With regard to the hip this is doing great with the collagen I think will  get very close to complete closure I recommend we continue with the collagen. 02/17/2021 upon evaluation today patient appears to be doing excellent in regard to her heel ulcers. She has been tolerating the dressing changes without complication and overall I am extremely pleased with where things stand today. There does not appear to be any signs of active infection which is great news. Overall I may think that we are headed in the proper direction based on what I am seeing. 02/24/2021 upon evaluation today patient's wounds actually are showing signs of improvement in regard to her heels both are doing awesome. In regard to her hip location this is actually reopened after being closed last week and to be perfectly honest I am more concerned here about  the fact that pressures causing this issue I do not think it has anything to do with her not having the collagen in place again it was completely healed last week there was no reason for the collagen to be there. 03/03/2021 upon evaluation today patient appears to be doing well with regard to her wounds. Everything is showing signs of improvement and [name], [name]. ([id number]) doing some much better as far as the overall size of the wound. Fortunately I think we are headed in the right direction. 03/10/2021 upon evaluation today patient appears to be doing well with regard to her wound. She is tolerating the dressing changes without complication. The left heel pretty much appears to be almost completely healed although I think it is probably can be 1 more week before I can call this 100% slough. The right ankle is significantly improved with that being said its not completely closed. With regard to the right hip this is completely closed. Overall I am very pleased with where things stand I think were getting very close to complete resolution. I discussed all this with the patient and her daughter-in-law today. 03/17/2021 upon evaluation today patient appears to be doing well with regard to her wounds. Fortunately there does not appear to be any signs of active infection which is great news and overall very pleased with where the patient stands today. I am in general thankful that overall she is making great progress here. There is good to be a little bit of debridement here to clear away some of the necrotic debris today on both wound locations. 03/24/2021 upon evaluation today patient appears to be doing excellent in regard to her wound. She has been tolerating the dressing changes without complication. Fortunately there does not appear to be any evidence of infection the left foot is completely healed this is great news. On the right at the ankle region this is still open though I do not think the  alginate did quite as well as I was hoping as far as getting this dried out. I think that we may just want to switch back to the [facility] which has done well up to this point. I was just hopeful this would wrap things up completely for [place]. 03/31/2021 upon evaluation today patient appears to be doing well with regard to her wound. This is on the right side which appears to be doing better than last week I think is definitely measuring smaller and this is great news. Overall I am very pleased with where we stand today. I do believe the [facility] is doing better for her. 04/14/2021 upon evaluation today patient appears to be doing well with regard to her wound. I am very pleased with the way  it stands I think that she is headed in the right direction. Unfortunately she is having issues with the Tubigrip neither she nor her daughter-in-law who is the primary caregiver can get this on. Subsequently that means that they have been having a very difficult time with complying with the necessary things for this left leg. With that being said I am really not sure what to do to help in that regard. We can always try a bigger that is larger size Tubigrip but at the same time that means that she may not get as good compression and it may not keep things under control the only way to know is to try. 04/21/2021 upon evaluation today patient appears to be doing well in regard to her wound. She has been tolerating the dressing changes without complication and in fact the right ankle ulcer is actually showing signs of excellent improvement I am very pleased with where things stand today. No fevers, chills, nausea, vomiting, or diarrhea. 04/28/2021 upon evaluation today patient appears to be doing well with regard to her ankle ulcer. This is actually showing signs of good improvement which is great news and overall very pleased with where we stand today. No fevers, chills, nausea, vomiting, or  diarrhea. 05/05/2021 upon evaluation today patient's wound is actually showing signs of significant improvement and overall I am extremely pleased with where we stand today. There does not appear to be any signs of active infection which is great news and overall I am extremely pleased with where we stand at this point. No fevers, chills, nausea, vomiting, or diarrhea. 05/12/2021 upon evaluation today patient appears to be doing okay in regard to her wound is measuring a little bit smaller today that is good news. With that being said she still continues to have significant issues here with an open wound on the lateral aspect of her ankle. Fortunately there does not appear to be any signs of active infection systemically which is great news. No fevers, chills, nausea, vomiting, or diarrhea. 06/13/2021 upon evaluation today patient unfortunately continues to have issues with her right leg. Specifically the ankle region where there is an open wound. Previously the wound on the left leg had completely closed. Nonetheless this left heel has reopened as well. I am going to perform some debridement in regard to the heel ulcer appears to be a fluid-filled blister underneath this area. She has not been here for such a long time due to the fact that she was very sick and they were not able to bring her out. That is the reason we have not seen her in over the past month. 06/20/2021 upon evaluation patient's wounds currently are doing well on the left heel this appears to be healed. There does not appear to be any signs of drainage at this time which is great news. No fevers, chills, nausea, vomiting, or diarrhea. On the right heel there is still an open wound but I feel like we are showing some signs of improvement this is still good to take a bit of time. 06/27/2021 upon evaluation today patient appears to be doing better in regard to her wound. This is actually showing signs of good improvement which is great  news. Fortunately I do not see any signs of infection currently which is great and overall we are finally seeing some actual decrease in the size I think that the compression wraps are making a big difference here. 12/27 some debris on the surface. No evidence of surrounding  infection we have been using collagen 07/21/2021 upon evaluation today patient appears to be doing well with regard to her wound. She has been tolerating the dressing changes without complication. Fortunately I do not see any evidence of active infection locally nor systemically at this time which is great news and overall I think the wound making excellent progress here. 08/04/2021 upon evaluation today patient's wound is showing signs of being a little bit moist and really not significantly smaller. Fortunately I do not see any signs of active infection locally nor systemically at this time that is good news. Nonetheless I do feel like she is doing quite well otherwise and her left leg is still doing awesome. 08/11/2021 upon evaluation today patient appears to be doing well with regard to her wound. She has been tolerating the dressing changes without complication. Fortunately there does not appear to be any signs of active infection at this time. The wound appears to be less macerated though I do believe she was moistening the alginate just a little bit I told her she does not need to do that when putting on a longer she voiced understanding and will not do so any longer. 08/18/2021 upon evaluation today patient's wound appears to be showing signs of being slightly larger compared to previous. We will talk in millimeters but nonetheless this is not headed in the right direction. Subsequently I am not sure exactly what the best plan will be going forward. I think Sorbact could be a good option willing to give this a try over the next week and see how things go. Unfortunately she does have a new blister on the left heel which has  arisen nothing is open and I am hoping it will reabsorb but again that may not be the case. We will have to keep a close eye on this. 2/17; right lateral malleolus which has been a difficult wound seems to be doing well. Under illumination healthy tissue this is filling in nicely using Sorbact hydrogel and a border foam dressing. [name] ([id number]) Apparently last week she developed a blister on her left medial calcaneus. Most of this is already epithelialized the use Sorbact on this as well. She is developing a threatened area on the medial aspect of her left first metatarsal head this is not open but looks like there is an irritation. They have ordered their adaptive foot wear which are arriving soon 09/01/2021 upon evaluation today patient appears to be doing very well in regard to her wounds in general. I do feel like that she is making excellent progress and overall I am extremely pleased with where we stand today. Fortunately there does not appear to be any evidence of active infection locally nor systemically at this time. Fortunately I do not see any evidence of active infection which is also. 09/08/2021 upon evaluation today patient appears to be doing well with regard to her wounds. She is tolerating the dressing changes without complication. Fortunately I do not see any evidence of active infection locally or systemically which is great news. 09/23/2019 upon evaluation today patient appears to be doing well with regard to her wounds. On the left side this appears to be completely healed on the right side though not completely healed this does seem to be doing significantly better which is great news. No fever chills noted Readmission: 02-05-2022 upon evaluation today patient presents for reevaluation in the clinic although this is for completely separate issue compared to what I previously saw her  for. She sustained a skin tear to the left lower extremity anteriorly which occurred 3  weeks ago. She was seen in the ER where they subsequently were able to put several sutures and to reattach the skin flap fortunately this has saved her a lot of healing time. Fortunately I do not see any evidence of active infection locally or systemically which is great news. No fevers, chills, nausea, vomiting, or diarrhea. 02-20-2022 upon evaluation today patient appears to be doing well currently in regard to her wound. She is actually very close to complete resolution which is great news. Fortunately I do not see any signs of active infection locally or systemically which is great news as well. Electronic Signature(s) Signed: 02/20/2022 3:36:11 PM By: Worthy Keeler PA-C Entered By: Worthy Keeler on 02/20/2022 15:36:11 Diggs, [name] ([id number]) -------------------------------------------------------------------------------- Physical Exam Details Patient Name: [name], [name] Date of Service: 02/20/2022 3:00 PM Medical Record Number: [id number] Patient Account Number: [id number] Date of Birth/Sex: 1927/08/04 (88 y.o. F) Treating RN: Cornell Barman Primary Care Provider: Viviana Simpler Other Clinician: Massie Kluver Referring Provider: Viviana Simpler Treating Provider/Extender: Skipper Cliche in Treatment: 2 Constitutional Well-nourished and well-hydrated in no acute distress. Respiratory normal breathing without difficulty. Psychiatric this patient is able to make decisions and demonstrates good insight into disease process. Alert and Oriented x 3. pleasant and cooperative. Notes Upon inspection patient's wound bed actually showed signs of good granulation and epithelization at this point. Fortunately I do not see any evidence of infection locally or systemically which is great news as well and overall I am extremely pleased with where we stand currently. Electronic Signature(s) Signed: 02/20/2022 3:36:28 PM By: Worthy Keeler PA-C Entered By: Worthy Keeler on 02/20/2022  15:36:28 [name] ([id number]) -------------------------------------------------------------------------------- Physician Orders Details Patient Name: [name], [name] Date of Service: 02/20/2022 3:00 PM Medical Record Number: [id number] Patient Account Number: [id number] Date of Birth/Sex: 03-17-1928 (88 y.o. F) Treating RN: Alycia Rossetti Primary Care Provider: Viviana Simpler Other Clinician: Massie Kluver Referring Provider: Viviana Simpler Treating Provider/Extender: Skipper Cliche in Treatment: 2 Verbal / Phone Orders: No Diagnosis Coding ICD-10 Coding Code Description [phone] Laceration without foreign body, left lower leg, initial encounter I73.89 Other specified peripheral vascular diseases I10 Essential (primary) hypertension I25.10 Atherosclerotic heart disease of native coronary artery without angina pectoris Follow-up Appointments Wound #7 Left Lower Leg o Return Appointment in 2 weeks. Bathing/ Shower/ Hygiene o Wash wounds with antibacterial soap and water. o May shower; gently cleanse wound with antibacterial soap, rinse and pat dry prior to dressing wounds Edema Control - Lymphedema / Segmental Compressive Device / Other o Elevate, Exercise Daily and Avoid Standing for Long Periods of Time. o Elevate legs to the level of the heart and pump ankles as often as possible o Elevate leg(s) parallel to the floor when sitting. Wound Treatment Wound #7 - Lower Leg Wound Laterality: Left Cleanser: Soap and Water 1 x Per Day/15 Days Discharge Instructions: Gently cleanse wound with antibacterial soap, rinse and pat dry prior to dressing wounds Topical: triple antibiotic (Generic) 1 x Per Day/15 Days Primary Dressing: Non-Adherent Pad 3x8 (in/in) 1 x Per Day/15 Days Discharge Instructions: Add to wound bed to alleviate sticking. Secondary Dressing: 1 x Per Day/15 Days Secured With: Coban Cohesive Bandage 4x5 (yds) Stretched (Generic) 1 x Per QAS/34  Days Discharge Instructions: Apply Coban as directed. Electronic Signature(s) Signed: 02/20/2022 4:17:10 PM By: Alycia Rossetti Signed: 02/22/2022 5:21:19 PM By: Worthy Keeler PA-C Entered  By: Alycia Rossetti on 02/20/2022 15:29:28 [name] ([id number]) -------------------------------------------------------------------------------- Problem List Details Patient Name: [name], [name] Date of Service: 02/20/2022 3:00 PM Medical Record Number: [id number] Patient Account Number: [id number] Date of Birth/Sex: [birth year] (88 y.o. F) Treating RN: Cornell Barman Primary Care Provider: Viviana Simpler Other Clinician: Massie Kluver Referring Provider: Viviana Simpler Treating Provider/Extender: Skipper Cliche in Treatment: 2 Active Problems ICD-10 Encounter Code Description Active Date MDM Diagnosis [phone] Laceration without foreign body, left lower leg, initial encounter 02/05/2022 No Yes I73.89 Other specified peripheral vascular diseases 02/05/2022 No Yes I10 Essential (primary) hypertension 02/05/2022 No Yes I25.10 Atherosclerotic heart disease of native coronary artery without angina 02/05/2022 No Yes pectoris Inactive Problems Resolved Problems Electronic Signature(s) Signed: 02/20/2022 3:11:46 PM By: Worthy Keeler PA-C Entered By: Worthy Keeler on 02/20/2022 15:11:46 [name], [name] ([id number]) -------------------------------------------------------------------------------- Progress Note Details Patient Name: [name] Date of Service: 02/20/2022 3:00 PM Medical Record Number: [id number] Patient Account Number: [id number] Date of Birth/Sex: 1928/06/22 (88 y.o. F) Treating RN: Cornell Barman Primary Care Provider: Viviana Simpler Other Clinician: Massie Kluver Referring Provider: Viviana Simpler Treating Provider/Extender: Skipper Cliche in Treatment: 2 Subjective Chief Complaint Information obtained from Patient Left anterior LE Laceration History of Present  Illness (HPI) 10/21/2020 upon evaluation today patient presents for initial inspection here in our clinic concerning issues that she has been having quite some time in regard to her ankle and since she has been in the hospital with regard to the heel. The ankle in fact has been 5-6 years at least I am told. With that being said the heel ulcer occurred when she was in the hospital in December for hip surgery when she fractured her hip. She also was in the hospital Tuesday for altered mental status. Really there was nothing that was identified as the cause for this. She did have a. Fortunately there does not appear to be any signs of infection she tells me that she has had she believes arterial studies At [facility] clinic I could not find Those studies at this point. Nonetheless I will continue to look and see what I can find before Then. The patient also has a skin tear on her arm this occurred more recently when she bumped this at home. The patient does have a history of hypertension, coronary artery disease, and peripheral vascular disease stated. 10/28/2020 I was able to find the patient's chart currently which shows that she did have an arterial study performed in May 2019. This showed that she had a abnormal right toe brachial index and a normal left toe brachial index. She was noncompressible as far as ABIs were concerned. 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 Unfortunately the wound that is commented on in the report that I printed off and read mentions the same wound on the ankle that we are still dealing with at this point. Unfortunately this has not healed. And its been quite sometime about 3 years now. Fortunately there does not appear to be any signs of active infection systemically at this point. I think that the patient has done well with the Santyl over the past week which is good news. Patient's caregiver which is her daughter-in-law is concerned about the fact that she  really does not feel qualified to be able to change the dressings and take care of this issue. There does not appear to be any signs of anything untoward going on at this point. I think she is done  a great job applying the [employer] and I think that has done a great job for the patient is for soften up some of the necrotic tissue. With that being said I think the Xeroform on the arm also has done excellent. In general I am very pleased with where we stand. And I told the patient's daughter-in- law as well that I also feel like she has done a great job over the past week taking care of her mother. 11/11/2020 upon evaluation today patient appears to be doing well with regard to her wounds. She has been tolerating the dressing changes without complication her daughters been applying the Santyl which has done a great job. Ho with that being said I think now that we have good arterial study showing we can go ahead and proceed with sharp debridement at this point. 11/25/2020 upon evaluation today patient appears to be doing well with regard to her wounds. The Santyl has really helped to clean things up and overall she is doing quite excellent at this point. Fortunately there does not appear to be any signs of infection which is great news and in general extremely pleased. I do believe some debridement is in order and hopefully will be able to get her into a collagen dressing after this. 12/23/2020 upon evaluation today patient appears to be doing well with regard to her wound all things considered. Unfortunately it does sound like her daughter decided to let this air out because she felt like it was getting so wet. It sounds like she got border gauze dressings instead of border foam therefore there is really Nothing to catch the excess drainage which I think is the problem they were worried about the smell. Fortunately there does not appear to be any signs of active infection which is great news. Nonetheless I do  not think we want to leave this just open to air. 01/13/2021 upon evaluation today patient's wounds actually appear to be about as good as have seen since have been taking care of her. Fortunately there does not appear to be any evidence of infection which is great and overall the biggest issue I see is that of fluid buildup. I think we need to do something to try to help manage this. I think if we can control her edema we get the wounds to heal more effectively. 01/27/2021 upon evaluation today patient appears to be doing well in regard to the wounds on her right lateral malleolus as well as the left heel and the right ischial tuberosity is unfortunately a new area that has arisen since we last saw her. She tells me currently that that is from sitting too much. With that being said this actually appears to be doing the worst of anything so far that I see today. Fortunately there does not appear to be any signs of infection this is at least good news. Compression wrap seem to be doing excellent for her as far as the legs are concerned. 02/03/2021 upon evaluation today patient appears to be doing well with regard to all of her wounds. She has been tolerating the dressing changes without complication. Fortunately there is no signs of active infection at this time. No fevers, chills, nausea, vomiting, or diarrhea. 02/10/2021 upon evaluation today patient appears to be doing well with regard to her wounds. With that being said there is some slight evidence of hypergranulation in regard to the right ankle in particular and a little bit in regard to the left heel. I  think Hydrofera Blue might be a better option to go to at this point based on what I am seeing especially since both of these wounds in particular seem to be a little bit stalled. I discussed that with the patient and her daughter today. With regard to the hip this is doing great with the collagen I think will get very close to complete closure I  recommend we continue with the collagen. 02/17/2021 upon evaluation today patient appears to be doing excellent in regard to her heel ulcers. She has been tolerating the dressing changes without complication and overall I am extremely pleased with where things stand today. There does not appear to be any signs of active infection which is great news. Overall I may think that we are headed in the proper direction based on what I am seeing. 02/24/2021 upon evaluation today patient's wounds actually are showing signs of improvement in regard to her heels both are doing awesome. In [place], [place] ([id number]) regard to her hip location this is actually reopened after being closed last week and to be perfectly honest I am more concerned here about the fact that pressures causing this issue I do not think it has anything to do with her not having the collagen in place again it was completely healed last week there was no reason for the collagen to be there. 03/03/2021 upon evaluation today patient appears to be doing well with regard to her wounds. Everything is showing signs of improvement and doing some much better as far as the overall size of the wound. Fortunately I think we are headed in the right direction. 03/10/2021 upon evaluation today patient appears to be doing well with regard to her wound. She is tolerating the dressing changes without complication. The left heel pretty much appears to be almost completely healed although I think it is probably can be 1 more week before I can call this 100% slough. The right ankle is significantly improved with that being said its not completely closed. With regard to the right hip this is completely closed. Overall I am very pleased with where things stand I think were getting very close to complete resolution. I discussed all this with the patient and her daughter-in-law today. 03/17/2021 upon evaluation today patient appears to be doing well with regard to her  wounds. Fortunately there does not appear to be any signs of active infection which is great news and overall very pleased with where the patient stands today. I am in general thankful that overall she is making great progress here. There is good to be a little bit of debridement here to clear away some of the necrotic debris today on both wound locations. 03/24/2021 upon evaluation today patient appears to be doing excellent in regard to her wound. She has been tolerating the dressing changes without complication. Fortunately there does not appear to be any evidence of infection the left foot is completely healed this is great news. On the right at the ankle region this is still open though I do not think the alginate did quite as well as I was hoping as far as getting this dried out. I think that we may just want to switch back to the [facility] which has done well up to this point. I was just hopeful this would wrap things up completely for [place]. 03/31/2021 upon evaluation today patient appears to be doing well with regard to her wound. This is on the right side which appears to  be doing better than last week I think is definitely measuring smaller and this is great news. Overall I am very pleased with where we stand today. I do believe the [facility] is doing better for her. 04/14/2021 upon evaluation today patient appears to be doing well with regard to her wound. I am very pleased with the way it stands I think that she is headed in the right direction. Unfortunately she is having issues with the Tubigrip neither she nor her daughter-in-law who is the primary caregiver can get this on. Subsequently that means that they have been having a very difficult time with complying with the necessary things for this left leg. With that being said I am really not sure what to do to help in that regard. We can always try a bigger that is larger size Tubigrip but at the same time that means that she may  not get as good compression and it may not keep things under control the only way to know is to try. 04/21/2021 upon evaluation today patient appears to be doing well in regard to her wound. She has been tolerating the dressing changes without complication and in fact the right ankle ulcer is actually showing signs of excellent improvement I am very pleased with where things stand today. No fevers, chills, nausea, vomiting, or diarrhea. 04/28/2021 upon evaluation today patient appears to be doing well with regard to her ankle ulcer. This is actually showing signs of good improvement which is great news and overall very pleased with where we stand today. No fevers, chills, nausea, vomiting, or diarrhea. 05/05/2021 upon evaluation today patient's wound is actually showing signs of significant improvement and overall I am extremely pleased with where we stand today. There does not appear to be any signs of active infection which is great news and overall I am extremely pleased with where we stand at this point. No fevers, chills, nausea, vomiting, or diarrhea. 05/12/2021 upon evaluation today patient appears to be doing okay in regard to her wound is measuring a little bit smaller today that is good news. With that being said she still continues to have significant issues here with an open wound on the lateral aspect of her ankle. Fortunately there does not appear to be any signs of active infection systemically which is great news. No fevers, chills, nausea, vomiting, or diarrhea. 06/13/2021 upon evaluation today patient unfortunately continues to have issues with her right leg. Specifically the ankle region where there is an open wound. Previously the wound on the left leg had completely closed. Nonetheless this left heel has reopened as well. I am going to perform some debridement in regard to the heel ulcer appears to be a fluid-filled blister underneath this area. She has not been here for such a long  time due to the fact that she was very sick and they were not able to bring her out. That is the reason we have not seen her in over the past month. 06/20/2021 upon evaluation patient's wounds currently are doing well on the left heel this appears to be healed. There does not appear to be any signs of drainage at this time which is great news. No fevers, chills, nausea, vomiting, or diarrhea. On the right heel there is still an open wound but I feel like we are showing some signs of improvement this is still good to take a bit of time. 06/27/2021 upon evaluation today patient appears to be doing better in regard to her  wound. This is actually showing signs of good improvement which is great news. Fortunately I do not see any signs of infection currently which is great and overall we are finally seeing some actual decrease in the size I think that the compression wraps are making a big difference here. 12/27 some debris on the surface. No evidence of surrounding infection we have been using collagen 07/21/2021 upon evaluation today patient appears to be doing well with regard to her wound. She has been tolerating the dressing changes without complication. Fortunately I do not see any evidence of active infection locally nor systemically at this time which is great news and overall I think the wound making excellent progress here. 08/04/2021 upon evaluation today patient's wound is showing signs of being a little bit moist and really not significantly smaller. Fortunately I do not see any signs of active infection locally nor systemically at this time that is good news. Nonetheless I do feel like she is doing quite well otherwise and her left leg is still doing awesome. 08/11/2021 upon evaluation today patient appears to be doing well with regard to her wound. She has been tolerating the dressing changes without complication. Fortunately there does not appear to be any signs of active infection at this  time. The wound appears to be less macerated though I do believe she was moistening the alginate just a little bit I told her she does not need to do that when putting on a longer she voiced understanding and will not do so any longer. 08/18/2021 upon evaluation today patient's wound appears to be showing signs of being slightly larger compared to previous. We will talk in millimeters but nonetheless this is not headed in the right direction. Subsequently I am not sure exactly what the best plan will be going forward. I think Sorbact could be a good option willing to give this a try over the next week and see how things go. Unfortunately she does have a new blister on the left heel which has arisen nothing is open and I am hoping it will reabsorb but again that may not be the case. We will have to keep [name], [name]. ([id number]) a close eye on this. 2/17; right lateral malleolus which has been a difficult wound seems to be doing well. Under illumination healthy tissue this is filling in nicely using Sorbact hydrogel and a border foam dressing. Apparently last week she developed a blister on her left medial calcaneus. Most of this is already epithelialized the use Sorbact on this as well. She is developing a threatened area on the medial aspect of her left first metatarsal head this is not open but looks like there is an irritation. They have ordered their adaptive foot wear which are arriving soon 09/01/2021 upon evaluation today patient appears to be doing very well in regard to her wounds in general. I do feel like that she is making excellent progress and overall I am extremely pleased with where we stand today. Fortunately there does not appear to be any evidence of active infection locally nor systemically at this time. Fortunately I do not see any evidence of active infection which is also. 09/08/2021 upon evaluation today patient appears to be doing well with regard to her wounds. She is  tolerating the dressing changes without complication. Fortunately I do not see any evidence of active infection locally or systemically which is great news. 09/23/2019 upon evaluation today patient appears to be doing well with regard to her  wounds. On the left side this appears to be completely healed on the right side though not completely healed this does seem to be doing significantly better which is great news. No fever chills noted Readmission: 02-05-2022 upon evaluation today patient presents for reevaluation in the clinic although this is for completely separate issue compared to what I previously saw her for. She sustained a skin tear to the left lower extremity anteriorly which occurred 3 weeks ago. She was seen in the ER where they subsequently were able to put several sutures and to reattach the skin flap fortunately this has saved her a lot of healing time. Fortunately I do not see any evidence of active infection locally or systemically which is great news. No fevers, chills, nausea, vomiting, or diarrhea. 02-20-2022 upon evaluation today patient appears to be doing well currently in regard to her wound. She is actually very close to complete resolution which is great news. Fortunately I do not see any signs of active infection locally or systemically which is great news as well. Objective Constitutional Well-nourished and well-hydrated in no acute distress. Vitals Time Taken: 3:10 PM, Height: 66 in, Weight: 85 lbs, BMI: 13.7, Temperature: 97.8 F, Pulse: 83 bpm, Respiratory Rate: 16 breaths/min, Blood Pressure: 152/79 mmHg. Respiratory normal breathing without difficulty. Psychiatric this patient is able to make decisions and demonstrates good insight into disease process. Alert and Oriented x 3. pleasant and cooperative. General Notes: Upon inspection patient's wound bed actually showed signs of good granulation and epithelization at this point. Fortunately I do not see any  evidence of infection locally or systemically which is great news as well and overall I am extremely pleased with where we stand currently. Integumentary (Hair, Skin) Wound #7 status is Open. Original cause of wound was Trauma. The date acquired was: 01/14/2022. The wound has been in treatment 2 weeks. The wound is located on the Left Lower Leg. The wound measures 2cm length x 0.4cm width x 0.1cm depth; 0.628cm^2 area and 0.063cm^3 volume. There is Fat Layer (Subcutaneous Tissue) exposed. There is a medium amount of serous drainage noted. There is medium (34-66%) pink granulation within the wound bed. There is a medium (34-66%) amount of necrotic tissue within the wound bed including Adherent Slough. Assessment Active Problems ICD-10 Laceration without foreign body, left lower leg, initial encounter Other specified peripheral vascular diseases Essential (primary) hypertension Atherosclerotic heart disease of native coronary artery without angina pectoris [name], [name] ([id number]) Plan Follow-up Appointments: Wound #7 Left Lower Leg: Return Appointment in 2 weeks. Bathing/ Shower/ Hygiene: Wash wounds with antibacterial soap and water. May shower; gently cleanse wound with antibacterial soap, rinse and pat dry prior to dressing wounds Edema Control - Lymphedema / Segmental Compressive Device / Other: Elevate, Exercise Daily and Avoid Standing for Long Periods of Time. Elevate legs to the level of the heart and pump ankles as often as possible Elevate leg(s) parallel to the floor when sitting. WOUND #7: - Lower Leg Wound Laterality: Left Cleanser: Soap and Water 1 x Per Day/15 Days Discharge Instructions: Gently cleanse wound with antibacterial soap, rinse and pat dry prior to dressing wounds Topical: triple antibiotic (Generic) 1 x Per Day/15 Days Primary Dressing: Non-Adherent Pad 3x8 (in/in) 1 x Per Day/15 Days Discharge Instructions: Add to wound bed to alleviate  sticking. Secondary Dressing: 1 x Per Day/15 Days Secured With: Coban Cohesive Bandage 4x5 (yds) Stretched (Generic) 1 x Per WUJ/81 Days Discharge Instructions: Apply Coban as directed. 1. I am going to suggest  that we going continue with the wound care measures as before and the patient is in agreement with plan were very close to complete resolution which is great news. 2. I am also can recommend that the patient continue specifically with the triple antibiotic ointment, nonstick dressing, and then the Coban lightly applied to keep this from being too tight. 3. I would also recommend that she needs to continue to elevate her legs much as possible she does have a lot of pitting edema in the leg as well even though her legs are skinny. I will see the patient back for follow-up visit in 1 week to see where things stand. Electronic Signature(s) Signed: 02/20/2022 3:37:20 PM By: Worthy Keeler PA-C Entered By: Worthy Keeler on 02/20/2022 15:37:20 [name], [name] ([id number]) -------------------------------------------------------------------------------- SuperBill Details Patient Name: [name], [name] Date of Service: 02/20/2022 Medical Record Number: [id number] Patient Account Number: [id number] Date of Birth/Sex: 02/07/1928 (88 y.o. F) Treating RN: Alycia Rossetti Primary Care Provider: Viviana Simpler Other Clinician: Massie Kluver Referring Provider: Viviana Simpler Treating Provider/Extender: Skipper Cliche in Treatment: 2 Diagnosis Coding ICD-10 Codes Code Description [phone] Laceration without foreign body, left lower leg, initial encounter I73.89 Other specified peripheral vascular diseases I10 Essential (primary) hypertension I25.10 Atherosclerotic heart disease of native coronary artery without angina pectoris Facility Procedures CPT4 Code: [id number] Description: 99213 - WOUND CARE VISIT-LEV 3 EST PT Modifier: Quantity: 1 Physician Procedures CPT4 Code:  [id number] Description: 99213 - WC PHYS LEVEL 3 - EST PT Modifier: Quantity: 1 CPT4 Code: Description: ICD-10 Diagnosis Description S81.812A Laceration without foreign body, left lower leg, initial encounter I73.89 Other specified peripheral vascular diseases I10 Essential (primary) hypertension I25.10 Atherosclerotic heart disease of native  coronary artery without angina Modifier: pectoris Quantity: Electronic Signature(s) Signed: 02/20/2022 3:37:40 PM By: Worthy Keeler PA-C Entered By: Worthy Keeler on 02/20/2022 15:37:39

## 2022-02-22 NOTE — Progress Notes (Signed)
KIMERLY, ROWAND ([id number]) Visit Report for 02/20/2022 Arrival Information Details Patient Name: [name], [name] Date of Service: 02/20/2022 3:00 PM Medical Record Number: [id number] Patient Account Number: [id number] Date of Birth/Sex: 1927-08-28 (88 y.o. F) Treating RN: Alycia Rossetti Primary Care Kelyn Ponciano: Viviana Simpler Other Clinician: Massie Kluver Referring Lennard Capek: Viviana Simpler Treating Dolan Xia/Extender: Skipper Cliche in Treatment: 2 Visit Information History Since Last Visit Added or deleted any medications: No Patient Arrived: [name] Any new allergies or adverse reactions: No Arrival Time: 15:09 Had a fall or experienced change in No Transfer Assistance: None activities of daily living that may affect Patient Requires Transmission-Based Precautions: No risk of falls: Patient Has Alerts: No Signs or symptoms of abuse/neglect since last visito No Hospitalized since last visit: No Has Dressing in Place as Prescribed: Yes Pain Present Now: No Electronic Signature(s) Signed: 02/20/2022 4:17:10 PM By: Alycia Rossetti Entered By: Alycia Rossetti on 02/20/2022 15:10:31 Janeway, [name] ([id number]) -------------------------------------------------------------------------------- Clinic Level of Care Assessment Details Patient Name: [name] Date of Service: 02/20/2022 3:00 PM Medical Record Number: [id number] Patient Account Number: [id number] Date of Birth/Sex: [birth year] (88 y.o. F) Treating RN: Alycia Rossetti Primary Care Katrine Radich: Viviana Simpler Other Clinician: Massie Kluver Referring Millicent Blazejewski: Viviana Simpler Treating Abdoulaye Drum/Extender: Skipper Cliche in Treatment: 2 Clinic Level of Care Assessment Items TOOL 4 Quantity Score '[]'$  - Use when only an EandM is performed on FOLLOW-UP visit 0 ASSESSMENTS - Nursing Assessment / Reassessment X - Reassessment of Co-morbidities (includes updates in patient status) 1 10 X- 1 5 Reassessment of Adherence to  Treatment Plan ASSESSMENTS - Wound and Skin Assessment / Reassessment X - Simple Wound Assessment / Reassessment - one wound 1 5 '[]'$  - 0 Complex Wound Assessment / Reassessment - multiple wounds '[]'$  - 0 Dermatologic / Skin Assessment (not related to wound area) ASSESSMENTS - Focused Assessment '[]'$  - Circumferential Edema Measurements - multi extremities 0 '[]'$  - 0 Nutritional Assessment / Counseling / Intervention '[]'$  - 0 Lower Extremity Assessment (monofilament, tuning fork, pulses) '[]'$  - 0 Peripheral Arterial Disease Assessment (using hand held doppler) ASSESSMENTS - Ostomy and/or Continence Assessment and Care '[]'$  - Incontinence Assessment and Management 0 '[]'$  - 0 Ostomy Care Assessment and Management (repouching, etc.) PROCESS - Coordination of Care X - Simple Patient / Family Education for ongoing care 1 15 '[]'$  - 0 Complex (extensive) Patient / Family Education for ongoing care X- 1 10 Staff obtains Programmer, systems, Records, Test Results / Process Orders '[]'$  - 0 Staff telephones HHA, Nursing Homes / Clarify orders / etc '[]'$  - 0 Routine Transfer to another Facility (non-emergent condition) '[]'$  - 0 Routine Hospital Admission (non-emergent condition) '[]'$  - 0 New Admissions / Biomedical engineer / Ordering NPWT, Apligraf, etc. '[]'$  - 0 Emergency Hospital Admission (emergent condition) X- 1 10 Simple Discharge Coordination '[]'$  - 0 Complex (extensive) Discharge Coordination PROCESS - Special Needs '[]'$  - Pediatric / Minor Patient Management 0 '[]'$  - 0 Isolation Patient Management '[]'$  - 0 Hearing / Language / Visual special needs '[]'$  - 0 Assessment of Community assistance (transportation, D/C planning, etc.) '[]'$  - 0 Additional assistance / Altered mentation '[]'$  - 0 Support Surface(s) Assessment (bed, cushion, seat, etc.) INTERVENTIONS - Wound Cleansing / Measurement TARINA, VOLK ([id number]) '[]'$  - 0 Simple Wound Cleansing - one wound X- 1 5 Complex Wound Cleansing - multiple wounds X- 1  5 Wound Imaging (photographs - any number of wounds) '[]'$  - 0 Wound Tracing (instead of photographs) X- 1 5 Simple Wound Measurement -  one wound '[]'$  - 0 Complex Wound Measurement - multiple wounds INTERVENTIONS - Wound Dressings X - Small Wound Dressing one or multiple wounds 1 10 '[]'$  - 0 Medium Wound Dressing one or multiple wounds '[]'$  - 0 Large Wound Dressing one or multiple wounds '[]'$  - 0 Application of Medications - topical '[]'$  - 0 Application of Medications - injection INTERVENTIONS - Miscellaneous '[]'$  - External ear exam 0 '[]'$  - 0 Specimen Collection (cultures, biopsies, blood, body fluids, etc.) '[]'$  - 0 Specimen(s) / Culture(s) sent or taken to Lab for analysis '[]'$  - 0 Patient Transfer (multiple staff / Civil Service fast streamer / Similar devices) '[]'$  - 0 Simple Staple / Suture removal (25 or less) '[]'$  - 0 Complex Staple / Suture removal (26 or more) '[]'$  - 0 Hypo / Hyperglycemic Management (close monitor of Blood Glucose) '[]'$  - 0 Ankle / Brachial Index (ABI) - do not check if billed separately X- 1 5 Vital Signs Has the patient been seen at the hospital within the last three years: Yes Total Score: 85 Level Of Care: New/Established - Level 3 Electronic Signature(s) Signed: 02/20/2022 4:17:10 PM By: Alycia Rossetti Entered By: Alycia Rossetti on 02/20/2022 15:27:07 [name] ([id number]) -------------------------------------------------------------------------------- Encounter Discharge Information Details Patient Name: [name] Date of Service: 02/20/2022 3:00 PM Medical Record Number: [id number] Patient Account Number: [id number] Date of Birth/Sex: [birth year] (88 y.o. F) Treating RN: Alycia Rossetti Primary Care Jackie Littlejohn: Viviana Simpler Other Clinician: Massie Kluver Referring Perlie Stene: Viviana Simpler Treating Filippo Puls/Extender: Skipper Cliche in Treatment: 2 Encounter Discharge Information Items Discharge Condition: Stable Ambulatory Status: Walker Discharge Destination:  Home Transportation: Private Auto Accompanied By: son and daughter-in-law Schedule Follow-up Appointment: Yes Clinical Summary of Care: Electronic Signature(s) Signed: 02/20/2022 4:17:10 PM By: Alycia Rossetti Entered By: Alycia Rossetti on 02/20/2022 15:41:18 Pantano, [name] ([id number]) -------------------------------------------------------------------------------- Lower Extremity Assessment Details Patient Name: ALLANA, SHRESTHA Date of Service: 02/20/2022 3:00 PM Medical Record Number: [id number] Patient Account Number: [id number] Date of Birth/Sex: 01-27-1928 (88 y.o. F) Treating RN: Alycia Rossetti Primary Care Verdella Laidlaw: Viviana Simpler Other Clinician: Massie Kluver Referring Giann Obara: Viviana Simpler Treating Ezmeralda Stefanick/Extender: Skipper Cliche in Treatment: 2 Vascular Assessment Pulses: Dorsalis Pedis Palpable: [Left:Yes] Electronic Signature(s) Signed: 02/20/2022 4:17:10 PM By: Alycia Rossetti Entered By: Alycia Rossetti on 02/20/2022 15:19:51 Schurman, [name] ([id number]) -------------------------------------------------------------------------------- Multi Wound Chart Details Patient Name: HONESTY, MENTA Date of Service: 02/20/2022 3:00 PM Medical Record Number: [id number] Patient Account Number: [id number] Date of Birth/Sex: April 02, 1928 (88 y.o. F) Treating RN: Alycia Rossetti Primary Care Bronsyn Shappell: Viviana Simpler Other Clinician: Massie Kluver Referring Shaquon Gropp: Viviana Simpler Treating Arkie Tagliaferro/Extender: Skipper Cliche in Treatment: 2 Vital Signs Height(in): 66 Pulse(bpm): 83 Weight(lbs): 28 Blood Pressure(mmHg): 152/79 Body Mass Index(BMI): 13.7 Temperature(F): 97.8 Respiratory Rate(breaths/min): 16 Photos: [N/A:N/A] Wound Location: Left Lower Leg N/A N/A Wounding Event: Trauma N/A N/A Primary Etiology: Trauma, Other N/A N/A Comorbid History: Arrhythmia, Coronary Artery N/A N/A Disease, Hypertension, Peripheral Arterial Disease, History of pressure wounds,  Osteoarthritis Date Acquired: 01/14/2022 N/A N/A Weeks of Treatment: 2 N/A N/A Wound Status: Open N/A N/A Wound Recurrence: No N/A N/A Measurements L x W x D (cm) 2x0.4x0.1 N/A N/A Area (cm) : 0.628 N/A N/A Volume (cm) : 0.063 N/A N/A % Reduction in Area: 91.60% N/A N/A % Reduction in Volume: 91.60% N/A N/A Classification: Full Thickness Without Exposed N/A N/A Support Structures Exudate Amount: Medium N/A N/A Exudate Type: Serous N/A N/A Exudate Color: amber N/A N/A Granulation Amount: Medium (34-66%) N/A N/A Granulation Quality: Pink N/A  N/A Necrotic Amount: Medium (34-66%) N/A N/A Exposed Structures: Fat Layer (Subcutaneous Tissue): N/A N/A Yes Fascia: No Tendon: No Muscle: No Joint: No Bone: No Epithelialization: Medium (34-66%) N/A N/A Treatment Notes Electronic Signature(s) Signed: 02/20/2022 4:17:10 PM By: Alycia Rossetti Entered By: Alycia Rossetti on 02/20/2022 15:20:08 [name] ([id number]) -------------------------------------------------------------------------------- [place] Details Patient Name: ARTRICE, KRAKER Date of Service: 02/20/2022 3:00 PM Medical Record Number: [id number] Patient Account Number: [id number] Date of Birth/Sex: 1927-12-26 (88 y.o. F) Treating RN: Alycia Rossetti Primary Care Kayston Jodoin: Viviana Simpler Other Clinician: Massie Kluver Referring Akelia Husted: Viviana Simpler Treating Jai Steil/Extender: Skipper Cliche in Treatment: 2 Active Inactive Necrotic Tissue Nursing Diagnoses: Impaired tissue integrity related to necrotic/devitalized tissue Knowledge deficit related to management of necrotic/devitalized tissue Goals: Necrotic/devitalized tissue will be minimized in the wound bed Date Initiated: 02/05/2022 Target Resolution Date: 02/05/2022 Goal Status: Active Patient/caregiver will verbalize understanding of reason and process for debridement of necrotic tissue Date Initiated: 02/05/2022 Target Resolution Date:  02/05/2022 Goal Status: Active Interventions: Assess patient pain level pre-, during and post procedure and prior to discharge Provide education on necrotic tissue and debridement process Treatment Activities: Excisional debridement : 02/05/2022 Notes: Orientation to the Wound Care Program Nursing Diagnoses: Knowledge deficit related to the wound healing center program Goals: Patient/caregiver will verbalize understanding of the [facility] Date Initiated: 02/05/2022 Target Resolution Date: 02/05/2022 Goal Status: Active Interventions: Provide education on orientation to the wound center Notes: Soft Tissue Infection Nursing Diagnoses: Impaired tissue integrity Knowledge deficit related to disease process and management Knowledge deficit related to home infection control: handwashing, handling of soiled dressings, supply storage Potential for infection: soft tissue Goals: Patient/caregiver will verbalize understanding of or measures to prevent infection and contamination in the home setting Date Initiated: 02/05/2022 Target Resolution Date: 02/05/2022 Goal Status: Active Patient's soft tissue infection will resolve Date Initiated: 02/05/2022 Target Resolution Date: 02/05/2022 Goal Status: Active Signs and symptoms of infection will be recognized early to allow for prompt treatment KARIMA, CARRELL ([id number]) Date Initiated: 02/05/2022 Target Resolution Date: 02/05/2022 Goal Status: Active Interventions: Assess signs and symptoms of infection every visit Provide education on infection Treatment Activities: Education provided on Infection : 02/05/2022 Notes: Wound/Skin Impairment Nursing Diagnoses: Impaired tissue integrity Goals: Patient/caregiver will verbalize understanding of skin care regimen Date Initiated: 02/05/2022 Target Resolution Date: 02/05/2022 Goal Status: Active Ulcer/skin breakdown will have a volume reduction of 30% by week 4 Date Initiated:  02/05/2022 Target Resolution Date: 03/05/2022 Goal Status: Active Interventions: Assess ulceration(s) every visit Treatment Activities: Skin care regimen initiated : 02/05/2022 Notes: Electronic Signature(s) Signed: 02/20/2022 4:17:10 PM By: Alycia Rossetti Entered By: Alycia Rossetti on 02/20/2022 15:19:57 Furio, [name] ([id number]) -------------------------------------------------------------------------------- Pain Assessment Details Patient Name: [name] Date of Service: 02/20/2022 3:00 PM Medical Record Number: [id number] Patient Account Number: [id number] Date of Birth/Sex: [birth year] (88 y.o. F) Treating RN: Alycia Rossetti Primary Care Earlie Arciga: Viviana Simpler Other Clinician: Massie Kluver Referring Patty Lopezgarcia: Viviana Simpler Treating Maryjane Benedict/Extender: Skipper Cliche in Treatment: 2 Active Problems Location of Pain Severity and Description of Pain Patient Has Paino No Site Locations Pain Management and Medication Current Pain Management: Electronic Signature(s) Signed: 02/20/2022 4:17:10 PM By: Alycia Rossetti Entered By: Alycia Rossetti on 02/20/2022 15:11:21 Marcin, [name] ([id number]) -------------------------------------------------------------------------------- Patient/Caregiver Education Details Patient Name: BERNIS, STECHER Date of Service: 02/20/2022 3:00 PM Medical Record Number: [id number] Patient Account Number: [id number] Date of Birth/Gender: [birth year] (88 y.o. F) Treating RN: Alycia Rossetti Primary Care Physician: Viviana Simpler Other  Clinician: Massie Kluver Referring Physician: Viviana Simpler Treating Physician/Extender: Skipper Cliche in Treatment: 2 Education Assessment Education Provided To: Patient Education Topics Provided Wound/Skin Impairment: Handouts: Caring for Your Ulcer Methods: Explain/Verbal Responses: State content correctly Electronic Signature(s) Signed: 02/20/2022 4:17:10 PM By: Alycia Rossetti Entered By: Alycia Rossetti on  02/20/2022 15:27:41 Beasley, [name] ([id number]) -------------------------------------------------------------------------------- Wound Assessment Details Patient Name: IRISHA, GRANDMAISON Date of Service: 02/20/2022 3:00 PM Medical Record Number: [id number] Patient Account Number: [id number] Date of Birth/Sex: 1928-06-17 (88 y.o. F) Treating RN: Alycia Rossetti Primary Care Yohan Samons: Viviana Simpler Other Clinician: Massie Kluver Referring Honestee Revard: Viviana Simpler Treating Kieley Akter/Extender: Skipper Cliche in Treatment: 2 Wound Status Wound Number: 7 Primary Trauma, Other Etiology: Wound Location: Left Lower Leg Wound Open Wounding Event: Trauma Status: Date Acquired: 01/14/2022 Comorbid Arrhythmia, Coronary Artery Disease, Hypertension, Weeks Of Treatment: 2 History: Peripheral Arterial Disease, History of pressure wounds, Clustered Wound: No Osteoarthritis Photos Wound Measurements Length: (cm) 2 Width: (cm) 0.4 Depth: (cm) 0.1 Area: (cm) 0.628 Volume: (cm) 0.063 % Reduction in Area: 91.6% % Reduction in Volume: 91.6% Epithelialization: Medium (34-66%) Wound Description Classification: Full Thickness Without Exposed Support Structures Exudate Amount: Medium Exudate Type: Serous Exudate Color: amber Foul Odor After Cleansing: No Slough/Fibrino Yes Wound Bed Granulation Amount: Medium (34-66%) Exposed Structure Granulation Quality: Pink Fascia Exposed: No Necrotic Amount: Medium (34-66%) Fat Layer (Subcutaneous Tissue) Exposed: Yes Necrotic Quality: Adherent Slough Tendon Exposed: No Muscle Exposed: No Joint Exposed: No Bone Exposed: No Treatment Notes Wound #7 (Lower Leg) Wound Laterality: Left Cleanser Soap and Water Discharge Instruction: Gently cleanse wound with antibacterial soap, rinse and pat dry prior to dressing wounds Peri-Wound Care SULAMITA, LAFOUNTAIN ([id number]) Topical triple antibiotic Primary Dressing Non-Adherent Pad 3x8 (in/in) Discharge  Instruction: Add to wound bed to alleviate sticking. Secondary Dressing Secured With Coban Cohesive Bandage 4x5 (yds) Stretched Discharge Instruction: Apply Coban as directed. Compression Wrap Compression Stockings Add-Ons Electronic Signature(s) Signed: 02/20/2022 4:17:10 PM By: Alycia Rossetti Entered By: Alycia Rossetti on 02/20/2022 15:19:12 Krotzer, [name] ([id number]) -------------------------------------------------------------------------------- Vitals Details Patient Name: [name] Date of Service: 02/20/2022 3:00 PM Medical Record Number: [id number] Patient Account Number: [id number] Date of Birth/Sex: May 03, 1928 (88 y.o. F) Treating RN: Alycia Rossetti Primary Care Goldie Dimmer: Viviana Simpler Other Clinician: Massie Kluver Referring Dishawn Bhargava: Viviana Simpler Treating Deano Tomaszewski/Extender: Skipper Cliche in Treatment: 2 Vital Signs Time Taken: 15:10 Temperature (F): 97.8 Height (in): 66 Pulse (bpm): 83 Weight (lbs): 85 Respiratory Rate (breaths/min): 16 Body Mass Index (BMI): 13.7 Blood Pressure (mmHg): 152/79 Reference Range: 80 - 120 mg / dl Electronic Signature(s) Signed: 02/20/2022 4:17:10 PM By: Alycia Rossetti Entered By: Alycia Rossetti on 02/20/2022 15:11:15

## 2022-02-26 ENCOUNTER — Ambulatory Visit: Payer: PPO | Admitting: Internal Medicine

## 2022-03-05 ENCOUNTER — Other Ambulatory Visit: Payer: Self-pay | Admitting: Internal Medicine

## 2022-03-05 DIAGNOSIS — M3501 Sicca syndrome with keratoconjunctivitis: Secondary | ICD-10-CM | POA: Diagnosis not present

## 2022-03-05 NOTE — Telephone Encounter (Signed)
Lorazepam #60 Last OV 11-20-21 Next OV 04-02-22 [place]

## 2022-03-06 ENCOUNTER — Encounter: Payer: PPO | Admitting: Physician Assistant

## 2022-03-06 DIAGNOSIS — Z9889 Other specified postprocedural states: Secondary | ICD-10-CM | POA: Diagnosis not present

## 2022-03-06 DIAGNOSIS — I5030 Unspecified diastolic (congestive) heart failure: Secondary | ICD-10-CM | POA: Diagnosis not present

## 2022-03-06 DIAGNOSIS — S81812A Laceration without foreign body, left lower leg, initial encounter: Secondary | ICD-10-CM | POA: Diagnosis not present

## 2022-03-06 DIAGNOSIS — D692 Other nonthrombocytopenic purpura: Secondary | ICD-10-CM | POA: Diagnosis not present

## 2022-03-06 DIAGNOSIS — I110 Hypertensive heart disease with heart failure: Secondary | ICD-10-CM | POA: Diagnosis not present

## 2022-03-06 DIAGNOSIS — L97822 Non-pressure chronic ulcer of other part of left lower leg with fat layer exposed: Secondary | ICD-10-CM | POA: Diagnosis not present

## 2022-03-06 DIAGNOSIS — H5461 Unqualified visual loss, right eye, normal vision left eye: Secondary | ICD-10-CM | POA: Diagnosis not present

## 2022-03-06 DIAGNOSIS — Z681 Body mass index (BMI) 19 or less, adult: Secondary | ICD-10-CM | POA: Diagnosis not present

## 2022-03-06 DIAGNOSIS — Z515 Encounter for palliative care: Secondary | ICD-10-CM | POA: Diagnosis not present

## 2022-03-06 DIAGNOSIS — I739 Peripheral vascular disease, unspecified: Secondary | ICD-10-CM | POA: Diagnosis not present

## 2022-03-06 NOTE — Progress Notes (Addendum)
MAKIYA, JEUNE ([id number]) Visit Report for 03/06/2022 Chief Complaint Document Details Patient Name: [name], [name]. Date of Service: 03/06/2022 3:00 PM Medical Record Number: [id number] Patient Account Number: [id number] Date of Birth/Sex: Dec 03, 1927 (88 y.o. F) Treating RN: Cornell Barman Primary Care Provider: Viviana Simpler Other Clinician: Massie Kluver Referring Provider: Viviana Simpler Treating Provider/Extender: Skipper Cliche in Treatment: 4 Information Obtained from: Patient Chief Complaint Left anterior LE Laceration Electronic Signature(s) Signed: 03/06/2022 2:58:29 PM By: Worthy Keeler PA-C Entered By: Worthy Keeler on 03/06/2022 14:58:29 Watters, [name] ([id number]) -------------------------------------------------------------------------------- HPI Details Patient Name: [name], [name] Date of Service: 03/06/2022 3:00 PM Medical Record Number: [id number] Patient Account Number: [id number] Date of Birth/Sex: [birth year] (88 y.o. F) Treating RN: Cornell Barman Primary Care Provider: Viviana Simpler Other Clinician: Massie Kluver Referring Provider: Viviana Simpler Treating Provider/Extender: Skipper Cliche in Treatment: 4 History of Present Illness HPI Description: 10/21/2020 upon evaluation today patient presents for initial inspection here in our clinic concerning issues that she has been having quite some time in regard to her ankle and since she has been in the hospital with regard to the heel. The ankle in fact has been 5-6 years at least I am told. With that being said the heel ulcer occurred when she was in the hospital in December for hip surgery when she fractured her hip. She also was in the hospital Tuesday for altered mental status. Really there was nothing that was identified as the cause for this. She did have a. Fortunately there does not appear to be any signs of infection she tells me that she has had she believes arterial studies At [facility]  clinic I could not find Those studies at this point. Nonetheless I will continue to look and see what I can find before Then. The patient also has a skin tear on her arm this occurred more recently when she bumped this at home. The patient does have a history of hypertension, coronary artery disease, and peripheral vascular disease stated. 10/28/2020 I was able to find the patient's chart currently which shows that she did have an arterial study performed in May 2019. This showed that she had a abnormal right toe brachial index and a normal left toe brachial index. She was noncompressible as far as ABIs were concerned. She did appear to have triphasic flow at that time. Unfortunately the wound that is commented on in the report that I printed off and read mentions the same wound on the ankle that we are still dealing with at this point. Unfortunately this has not healed. And its been quite sometime about 3 years now. Fortunately there does not appear to be any signs of active infection systemically at this point. I think that the patient has done well with the Santyl over the past week which is good news. Patient's caregiver which is her daughter-in-law is concerned about the fact that she really does not feel qualified to be able to change the dressings and take care of this issue. There does not appear to be any signs of anything untoward going on at this point. I think she is done a great job applying the [employer] and I think that has done a great job for the patient is for soften up some of the necrotic tissue. With that being said I think the Xeroform on the arm also has done excellent. In general I am very pleased with where we stand. And I told the patient's daughter-in- law as well  that I also feel like she has done a great job over the past week taking care of her mother. 11/11/2020 upon evaluation today patient appears to be doing well with regard to her wounds. She has been tolerating the  dressing changes without complication her daughters been applying the Santyl which has done a great job. Ho with that being said I think now that we have good arterial study showing we can go ahead and proceed with sharp debridement at this point. 11/25/2020 upon evaluation today patient appears to be doing well with regard to her wounds. The Santyl has really helped to clean things up and overall she is doing quite excellent at this point. Fortunately there does not appear to be any signs of infection which is great news and in general extremely pleased. I do believe some debridement is in order and hopefully will be able to get her into a collagen dressing after this. 12/23/2020 upon evaluation today patient appears to be doing well with regard to her wound all things considered. Unfortunately it does sound like her daughter decided to let this air out because she felt like it was getting so wet. It sounds like she got border gauze dressings instead of border foam therefore there is really Nothing to catch the excess drainage which I think is the problem they were worried about the smell. Fortunately there does not appear to be any signs of active infection which is great news. Nonetheless I do not think we want to leave this just open to air. 01/13/2021 upon evaluation today patient's wounds actually appear to be about as good as have seen since have been taking care of her. Fortunately there does not appear to be any evidence of infection which is great and overall the biggest issue I see is that of fluid buildup. I think we need to do something to try to help manage this. I think if we can control her edema we get the wounds to heal more effectively. 01/27/2021 upon evaluation today patient appears to be doing well in regard to the wounds on her right lateral malleolus as well as the left heel and the right ischial tuberosity is unfortunately a new area that has arisen since we last saw her. She tells  me currently that that is from sitting too much. With that being said this actually appears to be doing the worst of anything so far that I see today. Fortunately there does not appear to be any signs of infection this is at least good news. Compression wrap seem to be doing excellent for her as far as the legs are concerned. 02/03/2021 upon evaluation today patient appears to be doing well with regard to all of her wounds. She has been tolerating the dressing changes without complication. Fortunately there is no signs of active infection at this time. No fevers, chills, nausea, vomiting, or diarrhea. 02/10/2021 upon evaluation today patient appears to be doing well with regard to her wounds. With that being said there is some slight evidence of hypergranulation in regard to the right ankle in particular and a little bit in regard to the left heel. I think Hydrofera Blue might be a better option to go to at this point based on what I am seeing especially since both of these wounds in particular seem to be a little bit stalled. I discussed that with the patient and her daughter today. With regard to the hip this is doing great with the collagen I think will  get very close to complete closure I recommend we continue with the collagen. 02/17/2021 upon evaluation today patient appears to be doing excellent in regard to her heel ulcers. She has been tolerating the dressing changes without complication and overall I am extremely pleased with where things stand today. There does not appear to be any signs of active infection which is great news. Overall I may think that we are headed in the proper direction based on what I am seeing. 02/24/2021 upon evaluation today patient's wounds actually are showing signs of improvement in regard to her heels both are doing awesome. In regard to her hip location this is actually reopened after being closed last week and to be perfectly honest I am more concerned here about  the fact that pressures causing this issue I do not think it has anything to do with her not having the collagen in place again it was completely healed last week there was no reason for the collagen to be there. 03/03/2021 upon evaluation today patient appears to be doing well with regard to her wounds. Everything is showing signs of improvement and AYLA, DUNIGAN. ([id number]) doing some much better as far as the overall size of the wound. Fortunately I think we are headed in the right direction. 03/10/2021 upon evaluation today patient appears to be doing well with regard to her wound. She is tolerating the dressing changes without complication. The left heel pretty much appears to be almost completely healed although I think it is probably can be 1 more week before I can call this 100% slough. The right ankle is significantly improved with that being said its not completely closed. With regard to the right hip this is completely closed. Overall I am very pleased with where things stand I think were getting very close to complete resolution. I discussed all this with the patient and her daughter-in-law today. 03/17/2021 upon evaluation today patient appears to be doing well with regard to her wounds. Fortunately there does not appear to be any signs of active infection which is great news and overall very pleased with where the patient stands today. I am in general thankful that overall she is making great progress here. There is good to be a little bit of debridement here to clear away some of the necrotic debris today on both wound locations. 03/24/2021 upon evaluation today patient appears to be doing excellent in regard to her wound. She has been tolerating the dressing changes without complication. Fortunately there does not appear to be any evidence of infection the left foot is completely healed this is great news. On the right at the ankle region this is still open though I do not think the  alginate did quite as well as I was hoping as far as getting this dried out. I think that we may just want to switch back to the [facility] which has done well up to this point. I was just hopeful this would wrap things up completely for [place]. 03/31/2021 upon evaluation today patient appears to be doing well with regard to her wound. This is on the right side which appears to be doing better than last week I think is definitely measuring smaller and this is great news. Overall I am very pleased with where we stand today. I do believe the [facility] is doing better for her. 04/14/2021 upon evaluation today patient appears to be doing well with regard to her wound. I am very pleased with the way  it stands I think that she is headed in the right direction. Unfortunately she is having issues with the Tubigrip neither she nor her daughter-in-law who is the primary caregiver can get this on. Subsequently that means that they have been having a very difficult time with complying with the necessary things for this left leg. With that being said I am really not sure what to do to help in that regard. We can always try a bigger that is larger size Tubigrip but at the same time that means that she may not get as good compression and it may not keep things under control the only way to know is to try. 04/21/2021 upon evaluation today patient appears to be doing well in regard to her wound. She has been tolerating the dressing changes without complication and in fact the right ankle ulcer is actually showing signs of excellent improvement I am very pleased with where things stand today. No fevers, chills, nausea, vomiting, or diarrhea. 04/28/2021 upon evaluation today patient appears to be doing well with regard to her ankle ulcer. This is actually showing signs of good improvement which is great news and overall very pleased with where we stand today. No fevers, chills, nausea, vomiting, or  diarrhea. 05/05/2021 upon evaluation today patient's wound is actually showing signs of significant improvement and overall I am extremely pleased with where we stand today. There does not appear to be any signs of active infection which is great news and overall I am extremely pleased with where we stand at this point. No fevers, chills, nausea, vomiting, or diarrhea. 05/12/2021 upon evaluation today patient appears to be doing okay in regard to her wound is measuring a little bit smaller today that is good news. With that being said she still continues to have significant issues here with an open wound on the lateral aspect of her ankle. Fortunately there does not appear to be any signs of active infection systemically which is great news. No fevers, chills, nausea, vomiting, or diarrhea. 06/13/2021 upon evaluation today patient unfortunately continues to have issues with her right leg. Specifically the ankle region where there is an open wound. Previously the wound on the left leg had completely closed. Nonetheless this left heel has reopened as well. I am going to perform some debridement in regard to the heel ulcer appears to be a fluid-filled blister underneath this area. She has not been here for such a long time due to the fact that she was very sick and they were not able to bring her out. That is the reason we have not seen her in over the past month. 06/20/2021 upon evaluation patient's wounds currently are doing well on the left heel this appears to be healed. There does not appear to be any signs of drainage at this time which is great news. No fevers, chills, nausea, vomiting, or diarrhea. On the right heel there is still an open wound but I feel like we are showing some signs of improvement this is still good to take a bit of time. 06/27/2021 upon evaluation today patient appears to be doing better in regard to her wound. This is actually showing signs of good improvement which is great  news. Fortunately I do not see any signs of infection currently which is great and overall we are finally seeing some actual decrease in the size I think that the compression wraps are making a big difference here. 12/27 some debris on the surface. No evidence of surrounding  infection we have been using collagen 07/21/2021 upon evaluation today patient appears to be doing well with regard to her wound. She has been tolerating the dressing changes without complication. Fortunately I do not see any evidence of active infection locally nor systemically at this time which is great news and overall I think the wound making excellent progress here. 08/04/2021 upon evaluation today patient's wound is showing signs of being a little bit moist and really not significantly smaller. Fortunately I do not see any signs of active infection locally nor systemically at this time that is good news. Nonetheless I do feel like she is doing quite well otherwise and her left leg is still doing awesome. 08/11/2021 upon evaluation today patient appears to be doing well with regard to her wound. She has been tolerating the dressing changes without complication. Fortunately there does not appear to be any signs of active infection at this time. The wound appears to be less macerated though I do believe she was moistening the alginate just a little bit I told her she does not need to do that when putting on a longer she voiced understanding and will not do so any longer. 08/18/2021 upon evaluation today patient's wound appears to be showing signs of being slightly larger compared to previous. We will talk in millimeters but nonetheless this is not headed in the right direction. Subsequently I am not sure exactly what the best plan will be going forward. I think Sorbact could be a good option willing to give this a try over the next week and see how things go. Unfortunately she does have a new blister on the left heel which has  arisen nothing is open and I am hoping it will reabsorb but again that may not be the case. We will have to keep a close eye on this. 2/17; right lateral malleolus which has been a difficult wound seems to be doing well. Under illumination healthy tissue this is filling in nicely using Sorbact hydrogel and a border foam dressing. [name] ([id number]) Apparently last week she developed a blister on her left medial calcaneus. Most of this is already epithelialized the use Sorbact on this as well. She is developing a threatened area on the medial aspect of her left first metatarsal head this is not open but looks like there is an irritation. They have ordered their adaptive foot wear which are arriving soon 09/01/2021 upon evaluation today patient appears to be doing very well in regard to her wounds in general. I do feel like that she is making excellent progress and overall I am extremely pleased with where we stand today. Fortunately there does not appear to be any evidence of active infection locally nor systemically at this time. Fortunately I do not see any evidence of active infection which is also. 09/08/2021 upon evaluation today patient appears to be doing well with regard to her wounds. She is tolerating the dressing changes without complication. Fortunately I do not see any evidence of active infection locally or systemically which is great news. 09/23/2019 upon evaluation today patient appears to be doing well with regard to her wounds. On the left side this appears to be completely healed on the right side though not completely healed this does seem to be doing significantly better which is great news. No fever chills noted Readmission: 02-05-2022 upon evaluation today patient presents for reevaluation in the clinic although this is for completely separate issue compared to what I previously saw her  for. She sustained a skin tear to the left lower extremity anteriorly which occurred 3  weeks ago. She was seen in the ER where they subsequently were able to put several sutures and to reattach the skin flap fortunately this has saved her a lot of healing time. Fortunately I do not see any evidence of active infection locally or systemically which is great news. No fevers, chills, nausea, vomiting, or diarrhea. 02-20-2022 upon evaluation today patient appears to be doing well currently in regard to her wound. She is actually very close to complete resolution which is great news. Fortunately I do not see any signs of active infection locally or systemically which is great news as well. 03-06-2022 upon evaluation today patient appears to be doing well currently in regard to her wound in fact this appears to be completely healed based on what I am seeing. Fortunately there is no signs of active infection at this time. Electronic Signature(s) Signed: 03/06/2022 4:09:35 PM By: Worthy Keeler PA-C Entered By: Worthy Keeler on 03/06/2022 16:09:35 Nuon, [name] ([id number]) -------------------------------------------------------------------------------- Physical Exam Details Patient Name: HENREITTA, SPITTLER Date of Service: 03/06/2022 3:00 PM Medical Record Number: [id number] Patient Account Number: [id number] Date of Birth/Sex: 1928-01-23 (88 y.o. F) Treating RN: Cornell Barman Primary Care Provider: Viviana Simpler Other Clinician: Massie Kluver Referring Provider: Viviana Simpler Treating Provider/Extender: Skipper Cliche in Treatment: 4 Constitutional Well-nourished and well-hydrated in no acute distress. Respiratory normal breathing without difficulty. Psychiatric this patient is able to make decisions and demonstrates good insight into disease process. Alert and Oriented x 3. pleasant and cooperative. Notes Upon inspection patient's wound bed actually showed signs of good granulation and epithelization at this point. Fortunately there does not appear to be any evidence of  infection locally or systemically which is great news and overall complete epithelization is great based on what I see. Electronic Signature(s) Signed: 03/06/2022 4:09:53 PM By: Worthy Keeler PA-C Entered By: Worthy Keeler on 03/06/2022 16:09:52 Landowski, [name] ([id number]) -------------------------------------------------------------------------------- Physician Orders Details Patient Name: DREANNA, KYLLO Date of Service: 03/06/2022 3:00 PM Medical Record Number: [id number] Patient Account Number: [id number] Date of Birth/Sex: 17-Jan-1928 (88 y.o. F) Treating RN: Cornell Barman Primary Care Provider: Viviana Simpler Other Clinician: Massie Kluver Referring Provider: Viviana Simpler Treating Provider/Extender: Skipper Cliche in Treatment: 4 Verbal / Phone Orders: No Diagnosis Coding ICD-10 Coding Code Description [phone] Laceration without foreign body, left lower leg, initial encounter I73.89 Other specified peripheral vascular diseases I10 Essential (primary) hypertension I25.10 Atherosclerotic heart disease of native coronary artery without angina pectoris Discharge From [facility] o Discharge from [facility] Complete - wound healed please call if any further issues arise Bathing/ Shower/ Hygiene o Wash wounds with antibacterial soap and water. - ok to shower, don't scrub healed area o May shower; gently cleanse wound with antibacterial soap, rinse and pat dry prior to dressing wounds Additional Orders / Instructions o Other: - keep area covered for protection and wear stretch netting over area for 1 week Take off at bedtime Electronic Signature(s) Signed: 03/06/2022 4:37:11 PM By: Worthy Keeler PA-C Signed: 03/08/2022 4:44:47 PM By: Massie Kluver Entered By: Massie Kluver on 03/06/2022 15:41:21 Kimmer, [name] ([id number]) -------------------------------------------------------------------------------- Problem List Details Patient Name:  [name], [name] Date of Service: 03/06/2022 3:00 PM Medical Record Number: [id number] Patient Account Number: [id number] Date of Birth/Sex: Jun 10, 1928 (88 y.o. F) Treating RN: Cornell Barman Primary Care Provider: Viviana Simpler Other Clinician: Massie Kluver Referring Provider:  Viviana Simpler Treating Provider/Extender: Jeri Cos Weeks in Treatment: 4 Active Problems ICD-10 Encounter Code Description Active Date MDM Diagnosis S81.812A Laceration without foreign body, left lower leg, initial encounter 02/05/2022 No Yes I73.89 Other specified peripheral vascular diseases 02/05/2022 No Yes I10 Essential (primary) hypertension 02/05/2022 No Yes I25.10 Atherosclerotic heart disease of native coronary artery without angina 02/05/2022 No Yes pectoris Inactive Problems Resolved Problems Electronic Signature(s) Signed: 03/06/2022 2:58:26 PM By: Worthy Keeler PA-C Entered By: Worthy Keeler on 03/06/2022 14:58:26 Russomanno, [name] ([id number]) -------------------------------------------------------------------------------- Progress Note Details Patient Name: [name] Date of Service: 03/06/2022 3:00 PM Medical Record Number: [id number] Patient Account Number: [id number] Date of Birth/Sex: December 16, 1927 (88 y.o. F) Treating RN: Cornell Barman Primary Care Provider: Viviana Simpler Other Clinician: Massie Kluver Referring Provider: Viviana Simpler Treating Provider/Extender: Skipper Cliche in Treatment: 4 Subjective Chief Complaint Information obtained from Patient Left anterior LE Laceration History of Present Illness (HPI) 10/21/2020 upon evaluation today patient presents for initial inspection here in our clinic concerning issues that she has been having quite some time in regard to her ankle and since she has been in the hospital with regard to the heel. The ankle in fact has been 5-6 years at least I am told. With that being said the heel ulcer occurred when she was in the hospital  in December for hip surgery when she fractured her hip. She also was in the hospital Tuesday for altered mental status. Really there was nothing that was identified as the cause for this. She did have a. Fortunately there does not appear to be any signs of infection she tells me that she has had she believes arterial studies At [facility] clinic I could not find Those studies at this point. Nonetheless I will continue to look and see what I can find before Then. The patient also has a skin tear on her arm this occurred more recently when she bumped this at home. The patient does have a history of hypertension, coronary artery disease, and peripheral vascular disease stated. 10/28/2020 I was able to find the patient's chart currently which shows that she did have an arterial study performed in May 2019. This showed that she had a abnormal right toe brachial index and a normal left toe brachial index. She was noncompressible as far as ABIs were concerned. She did appear to have triphasic flow at that time. Unfortunately the wound that is commented on in the report that I printed off and read mentions the same wound on the ankle that we are still dealing with at this point. Unfortunately this has not healed. And its been quite sometime about 3 years now. Fortunately there does not appear to be any signs of active infection systemically at this point. I think that the patient has done well with the Santyl over the past week which is good news. Patient's caregiver which is her daughter-in-law is concerned about the fact that she really does not feel qualified to be able to change the dressings and take care of this issue. There does not appear to be any signs of anything untoward going on at this point. I think she is done a great job applying the [employer] and I think that has done a great job for the patient is for soften up some of the necrotic tissue. With that being said I think the Xeroform on the arm also  has done excellent. In general I am very pleased with where we stand. And I told  the patient's daughter-in- law as well that I also feel like she has done a great job over the past week taking care of her mother. 11/11/2020 upon evaluation today patient appears to be doing well with regard to her wounds. She has been tolerating the dressing changes without complication her daughters been applying the Santyl which has done a great job. Ho with that being said I think now that we have good arterial study showing we can go ahead and proceed with sharp debridement at this point. 11/25/2020 upon evaluation today patient appears to be doing well with regard to her wounds. The Santyl has really helped to clean things up and overall she is doing quite excellent at this point. Fortunately there does not appear to be any signs of infection which is great news and in general extremely pleased. I do believe some debridement is in order and hopefully will be able to get her into a collagen dressing after this. 12/23/2020 upon evaluation today patient appears to be doing well with regard to her wound all things considered. Unfortunately it does sound like her daughter decided to let this air out because she felt like it was getting so wet. It sounds like she got border gauze dressings instead of border foam therefore there is really Nothing to catch the excess drainage which I think is the problem they were worried about the smell. Fortunately there does not appear to be any signs of active infection which is great news. Nonetheless I do not think we want to leave this just open to air. 01/13/2021 upon evaluation today patient's wounds actually appear to be about as good as have seen since have been taking care of her. Fortunately there does not appear to be any evidence of infection which is great and overall the biggest issue I see is that of fluid buildup. I think we need to do something to try to help manage this. I  think if we can control her edema we get the wounds to heal more effectively. 01/27/2021 upon evaluation today patient appears to be doing well in regard to the wounds on her right lateral malleolus as well as the left heel and the right ischial tuberosity is unfortunately a new area that has arisen since we last saw her. She tells me currently that that is from sitting too much. With that being said this actually appears to be doing the worst of anything so far that I see today. Fortunately there does not appear to be any signs of infection this is at least good news. Compression wrap seem to be doing excellent for her as far as the legs are concerned. 02/03/2021 upon evaluation today patient appears to be doing well with regard to all of her wounds. She has been tolerating the dressing changes without complication. Fortunately there is no signs of active infection at this time. No fevers, chills, nausea, vomiting, or diarrhea. 02/10/2021 upon evaluation today patient appears to be doing well with regard to her wounds. With that being said there is some slight evidence of hypergranulation in regard to the right ankle in particular and a little bit in regard to the left heel. I think Hydrofera Blue might be a better option to go to at this point based on what I am seeing especially since both of these wounds in particular seem to be a little bit stalled. I discussed that with the patient and her daughter today. With regard to the hip this is doing great with  the collagen I think will get very close to complete closure I recommend we continue with the collagen. 02/17/2021 upon evaluation today patient appears to be doing excellent in regard to her heel ulcers. She has been tolerating the dressing changes without complication and overall I am extremely pleased with where things stand today. There does not appear to be any signs of active infection which is great news. Overall I may think that we are headed in  the proper direction based on what I am seeing. 02/24/2021 upon evaluation today patient's wounds actually are showing signs of improvement in regard to her heels both are doing awesome. In [place], [place] ([id number]) regard to her hip location this is actually reopened after being closed last week and to be perfectly honest I am more concerned here about the fact that pressures causing this issue I do not think it has anything to do with her not having the collagen in place again it was completely healed last week there was no reason for the collagen to be there. 03/03/2021 upon evaluation today patient appears to be doing well with regard to her wounds. Everything is showing signs of improvement and doing some much better as far as the overall size of the wound. Fortunately I think we are headed in the right direction. 03/10/2021 upon evaluation today patient appears to be doing well with regard to her wound. She is tolerating the dressing changes without complication. The left heel pretty much appears to be almost completely healed although I think it is probably can be 1 more week before I can call this 100% slough. The right ankle is significantly improved with that being said its not completely closed. With regard to the right hip this is completely closed. Overall I am very pleased with where things stand I think were getting very close to complete resolution. I discussed all this with the patient and her daughter-in-law today. 03/17/2021 upon evaluation today patient appears to be doing well with regard to her wounds. Fortunately there does not appear to be any signs of active infection which is great news and overall very pleased with where the patient stands today. I am in general thankful that overall she is making great progress here. There is good to be a little bit of debridement here to clear away some of the necrotic debris today on both wound locations. 03/24/2021 upon evaluation today  patient appears to be doing excellent in regard to her wound. She has been tolerating the dressing changes without complication. Fortunately there does not appear to be any evidence of infection the left foot is completely healed this is great news. On the right at the ankle region this is still open though I do not think the alginate did quite as well as I was hoping as far as getting this dried out. I think that we may just want to switch back to the [facility] which has done well up to this point. I was just hopeful this would wrap things up completely for [place]. 03/31/2021 upon evaluation today patient appears to be doing well with regard to her wound. This is on the right side which appears to be doing better than last week I think is definitely measuring smaller and this is great news. Overall I am very pleased with where we stand today. I do believe the [facility] is doing better for her. 04/14/2021 upon evaluation today patient appears to be doing well with regard to her wound. I am  very pleased with the way it stands I think that she is headed in the right direction. Unfortunately she is having issues with the Tubigrip neither she nor her daughter-in-law who is the primary caregiver can get this on. Subsequently that means that they have been having a very difficult time with complying with the necessary things for this left leg. With that being said I am really not sure what to do to help in that regard. We can always try a bigger that is larger size Tubigrip but at the same time that means that she may not get as good compression and it may not keep things under control the only way to know is to try. 04/21/2021 upon evaluation today patient appears to be doing well in regard to her wound. She has been tolerating the dressing changes without complication and in fact the right ankle ulcer is actually showing signs of excellent improvement I am very pleased with where things stand  today. No fevers, chills, nausea, vomiting, or diarrhea. 04/28/2021 upon evaluation today patient appears to be doing well with regard to her ankle ulcer. This is actually showing signs of good improvement which is great news and overall very pleased with where we stand today. No fevers, chills, nausea, vomiting, or diarrhea. 05/05/2021 upon evaluation today patient's wound is actually showing signs of significant improvement and overall I am extremely pleased with where we stand today. There does not appear to be any signs of active infection which is great news and overall I am extremely pleased with where we stand at this point. No fevers, chills, nausea, vomiting, or diarrhea. 05/12/2021 upon evaluation today patient appears to be doing okay in regard to her wound is measuring a little bit smaller today that is good news. With that being said she still continues to have significant issues here with an open wound on the lateral aspect of her ankle. Fortunately there does not appear to be any signs of active infection systemically which is great news. No fevers, chills, nausea, vomiting, or diarrhea. 06/13/2021 upon evaluation today patient unfortunately continues to have issues with her right leg. Specifically the ankle region where there is an open wound. Previously the wound on the left leg had completely closed. Nonetheless this left heel has reopened as well. I am going to perform some debridement in regard to the heel ulcer appears to be a fluid-filled blister underneath this area. She has not been here for such a long time due to the fact that she was very sick and they were not able to bring her out. That is the reason we have not seen her in over the past month. 06/20/2021 upon evaluation patient's wounds currently are doing well on the left heel this appears to be healed. There does not appear to be any signs of drainage at this time which is great news. No fevers, chills, nausea, vomiting,  or diarrhea. On the right heel there is still an open wound but I feel like we are showing some signs of improvement this is still good to take a bit of time. 06/27/2021 upon evaluation today patient appears to be doing better in regard to her wound. This is actually showing signs of good improvement which is great news. Fortunately I do not see any signs of infection currently which is great and overall we are finally seeing some actual decrease in the size I think that the compression wraps are making a big difference here. 12/27 some debris on the  surface. No evidence of surrounding infection we have been using collagen 07/21/2021 upon evaluation today patient appears to be doing well with regard to her wound. She has been tolerating the dressing changes without complication. Fortunately I do not see any evidence of active infection locally nor systemically at this time which is great news and overall I think the wound making excellent progress here. 08/04/2021 upon evaluation today patient's wound is showing signs of being a little bit moist and really not significantly smaller. Fortunately I do not see any signs of active infection locally nor systemically at this time that is good news. Nonetheless I do feel like she is doing quite well otherwise and her left leg is still doing awesome. 08/11/2021 upon evaluation today patient appears to be doing well with regard to her wound. She has been tolerating the dressing changes without complication. Fortunately there does not appear to be any signs of active infection at this time. The wound appears to be less macerated though I do believe she was moistening the alginate just a little bit I told her she does not need to do that when putting on a longer she voiced understanding and will not do so any longer. 08/18/2021 upon evaluation today patient's wound appears to be showing signs of being slightly larger compared to previous. We will talk  in millimeters but nonetheless this is not headed in the right direction. Subsequently I am not sure exactly what the best plan will be going forward. I think Sorbact could be a good option willing to give this a try over the next week and see how things go. Unfortunately she does have a new blister on the left heel which has arisen nothing is open and I am hoping it will reabsorb but again that may not be the case. We will have to keep JESSIAH, WOJNAR. ([id number]) a close eye on this. 2/17; right lateral malleolus which has been a difficult wound seems to be doing well. Under illumination healthy tissue this is filling in nicely using Sorbact hydrogel and a border foam dressing. Apparently last week she developed a blister on her left medial calcaneus. Most of this is already epithelialized the use Sorbact on this as well. She is developing a threatened area on the medial aspect of her left first metatarsal head this is not open but looks like there is an irritation. They have ordered their adaptive foot wear which are arriving soon 09/01/2021 upon evaluation today patient appears to be doing very well in regard to her wounds in general. I do feel like that she is making excellent progress and overall I am extremely pleased with where we stand today. Fortunately there does not appear to be any evidence of active infection locally nor systemically at this time. Fortunately I do not see any evidence of active infection which is also. 09/08/2021 upon evaluation today patient appears to be doing well with regard to her wounds. She is tolerating the dressing changes without complication. Fortunately I do not see any evidence of active infection locally or systemically which is great news. 09/23/2019 upon evaluation today patient appears to be doing well with regard to her wounds. On the left side this appears to be completely healed on the right side though not completely healed this does seem to be doing  significantly better which is great news. No fever chills noted Readmission: 02-05-2022 upon evaluation today patient presents for reevaluation in the clinic although this is for completely separate issue compared  to what I previously saw her for. She sustained a skin tear to the left lower extremity anteriorly which occurred 3 weeks ago. She was seen in the ER where they subsequently were able to put several sutures and to reattach the skin flap fortunately this has saved her a lot of healing time. Fortunately I do not see any evidence of active infection locally or systemically which is great news. No fevers, chills, nausea, vomiting, or diarrhea. 02-20-2022 upon evaluation today patient appears to be doing well currently in regard to her wound. She is actually very close to complete resolution which is great news. Fortunately I do not see any signs of active infection locally or systemically which is great news as well. 03-06-2022 upon evaluation today patient appears to be doing well currently in regard to her wound in fact this appears to be completely healed based on what I am seeing. Fortunately there is no signs of active infection at this time. Objective Constitutional Well-nourished and well-hydrated in no acute distress. Vitals Time Taken: 3:28 PM, Height: 66 in, Weight: 85 lbs, BMI: 13.7, Temperature: 97.8 F, Pulse: 74 bpm, Respiratory Rate: 16 breaths/min, Blood Pressure: 155/72 mmHg. Respiratory normal breathing without difficulty. Psychiatric this patient is able to make decisions and demonstrates good insight into disease process. Alert and Oriented x 3. pleasant and cooperative. General Notes: Upon inspection patient's wound bed actually showed signs of good granulation and epithelization at this point. Fortunately there does not appear to be any evidence of infection locally or systemically which is great news and overall complete epithelization is great based on what I  see. Integumentary (Hair, Skin) Wound #7 status is Healed - Epithelialized. Original cause of wound was Trauma. The date acquired was: 01/14/2022. The wound has been in treatment 4 weeks. The wound is located on the Left Lower Leg. The wound measures 0cm length x 0cm width x 0cm depth; 0cm^2 area and 0cm^3 volume. There is Fat Layer (Subcutaneous Tissue) exposed. There is no tunneling or undermining noted. There is a none present amount of drainage noted. There is no granulation within the wound bed. There is no necrotic tissue within the wound bed. Assessment Active Problems ICD-10 Laceration without foreign body, left lower leg, initial encounter Other specified peripheral vascular diseases VICKIE, MELNIK ([id number]) Essential (primary) hypertension Atherosclerotic heart disease of native coronary artery without angina pectoris Plan Discharge From [facility] Services: Discharge from [place] Treatment Complete - wound healed please call if any further issues arise Bathing/ Shower/ Hygiene: Wash wounds with antibacterial soap and water. - ok to shower, don't scrub healed area May shower; gently cleanse wound with antibacterial soap, rinse and pat dry prior to dressing wounds Additional Orders / Instructions: Other: - keep area covered for protection and wear stretch netting over area for 1 week Take off at bedtime 1. I would recommend currently that we go ahead and continue with the recommendation for a protective dressing just for the next week and then she should be fine. We can use an ABD pad and stretch net to hold this in place. 2. I would recommend as well that she should continue to monitor for any signs of worsening if anything changes they should contact the office and let me know otherwise I think she is ready for discharge. We will see the patient back for follow-up visit as needed. Electronic Signature(s) Signed: 03/06/2022 4:12:56 PM By: Worthy Keeler PA-C Entered By:  Worthy Keeler on 03/06/2022 16:12:56 Gothard, Darryll Capers  Darnell Level ([id number]) -------------------------------------------------------------------------------- SuperBill Details Patient Name: CHEN, [name]. Date of Service: 03/06/2022 Medical Record Number: [id number] Patient Account Number: [id number] Date of Birth/Sex: 1927/11/17 (88 y.o. F) Treating RN: Cornell Barman Primary Care Provider: Viviana Simpler Other Clinician: Massie Kluver Referring Provider: Viviana Simpler Treating Provider/Extender: Skipper Cliche in Treatment: 4 Diagnosis Coding ICD-10 Codes Code Description [phone] Laceration without foreign body, left lower leg, initial encounter I73.89 Other specified peripheral vascular diseases I10 Essential (primary) hypertension I25.10 Atherosclerotic heart disease of native coronary artery without angina pectoris Facility Procedures CPT4 Code: 32023343 Description: [phone] - WOUND CARE VISIT-LEV 2 EST PT Modifier: Quantity: 1 Physician Procedures CPT4 Code: [id number] Description: 99213 - WC PHYS LEVEL 3 - EST PT Modifier: Quantity: 1 CPT4 Code: Description: ICD-10 Diagnosis Description S81.812A Laceration without foreign body, left lower leg, initial encounter I73.89 Other specified peripheral vascular diseases I10 Essential (primary) hypertension I25.10 Atherosclerotic heart disease of native  coronary artery without angina Modifier: pectoris Quantity: Electronic Signature(s) Signed: 03/06/2022 4:13:36 PM By: Worthy Keeler PA-C Previous Signature: 03/06/2022 4:13:25 PM Version By: Worthy Keeler PA-C Entered By: Worthy Keeler on 03/06/2022 16:13:36

## 2022-03-06 NOTE — Progress Notes (Addendum)
[name], [name] ([id number]) Visit Report for 03/06/2022 Arrival Information Details Patient Name: [name], [name] Date of Service: 03/06/2022 3:00 PM Medical Record Number: [id number] Patient Account Number: [id number] Date of Birth/Sex: 1928/02/11 (88 y.o. F) Treating RN: Cornell Barman Primary Care Aalaysia Liggins: Viviana Simpler Other Clinician: Massie Kluver Referring Brette Cast: Viviana Simpler Treating Basil Buffin/Extender: Skipper Cliche in Treatment: 4 Visit Information History Since Last Visit All ordered tests and consults were completed: No Patient Arrived: [name] Added or deleted any medications: No Arrival Time: 15:21 Any new allergies or adverse reactions: No Transfer Assistance: None Had a fall or experienced change in No Patient Requires Transmission-Based Precautions: No activities of daily living that may affect Patient Has Alerts: No risk of falls: Hospitalized since last visit: No Pain Present Now: No Electronic Signature(s) Signed: 03/08/2022 4:44:47 PM By: Massie Kluver Entered By: Massie Kluver on 03/06/2022 15:24:52 Toole, [name] ([id number]) -------------------------------------------------------------------------------- Clinic Level of Care Assessment Details Patient Name: [name] Date of Service: 03/06/2022 3:00 PM Medical Record Number: [id number] Patient Account Number: [id number] Date of Birth/Sex: Nov 14, 1927 (88 y.o. F) Treating RN: Cornell Barman Primary Care Tearah Saulsbury: Viviana Simpler Other Clinician: Massie Kluver Referring Evanthia Maund: Viviana Simpler Treating Kambria Grima/Extender: Skipper Cliche in Treatment: 4 Clinic Level of Care Assessment Items TOOL 4 Quantity Score '[]'$  - Use when only an EandM is performed on FOLLOW-UP visit 0 ASSESSMENTS - Nursing Assessment / Reassessment X - Reassessment of Co-morbidities (includes updates in patient status) 1 10 X- 1 5 Reassessment of Adherence to Treatment Plan ASSESSMENTS - Wound and Skin Assessment  / Reassessment X - Simple Wound Assessment / Reassessment - one wound 1 5 '[]'$  - 0 Complex Wound Assessment / Reassessment - multiple wounds '[]'$  - 0 Dermatologic / Skin Assessment (not related to wound area) ASSESSMENTS - Focused Assessment '[]'$  - Circumferential Edema Measurements - multi extremities 0 '[]'$  - 0 Nutritional Assessment / Counseling / Intervention '[]'$  - 0 Lower Extremity Assessment (monofilament, tuning fork, pulses) '[]'$  - 0 Peripheral Arterial Disease Assessment (using hand held doppler) ASSESSMENTS - Ostomy and/or Continence Assessment and Care '[]'$  - Incontinence Assessment and Management 0 '[]'$  - 0 Ostomy Care Assessment and Management (repouching, etc.) PROCESS - Coordination of Care X - Simple Patient / Family Education for ongoing care 1 15 '[]'$  - 0 Complex (extensive) Patient / Family Education for ongoing care '[]'$  - 0 Staff obtains Programmer, systems, Records, Test Results / Process Orders '[]'$  - 0 Staff telephones HHA, Nursing Homes / Clarify orders / etc '[]'$  - 0 Routine Transfer to another Facility (non-emergent condition) '[]'$  - 0 Routine Hospital Admission (non-emergent condition) '[]'$  - 0 New Admissions / Biomedical engineer / Ordering NPWT, Apligraf, etc. '[]'$  - 0 Emergency Hospital Admission (emergent condition) X- 1 10 Simple Discharge Coordination '[]'$  - 0 Complex (extensive) Discharge Coordination PROCESS - Special Needs '[]'$  - Pediatric / Minor Patient Management 0 '[]'$  - 0 Isolation Patient Management '[]'$  - 0 Hearing / Language / Visual special needs '[]'$  - 0 Assessment of Community assistance (transportation, D/C planning, etc.) '[]'$  - 0 Additional assistance / Altered mentation '[]'$  - 0 Support Surface(s) Assessment (bed, cushion, seat, etc.) INTERVENTIONS - Wound Cleansing / Measurement [name], [name] ([id number]) X- 1 5 Simple Wound Cleansing - one wound '[]'$  - 0 Complex Wound Cleansing - multiple wounds X- 1 5 Wound Imaging (photographs - any number of  wounds) '[]'$  - 0 Wound Tracing (instead of photographs) '[]'$  - 0 Simple Wound Measurement - one wound '[]'$  - 0 Complex Wound Measurement -  multiple wounds INTERVENTIONS - Wound Dressings X - Small Wound Dressing one or multiple wounds 1 10 '[]'$  - 0 Medium Wound Dressing one or multiple wounds '[]'$  - 0 Large Wound Dressing one or multiple wounds '[]'$  - 0 Application of Medications - topical '[]'$  - 0 Application of Medications - injection INTERVENTIONS - Miscellaneous '[]'$  - External ear exam 0 '[]'$  - 0 Specimen Collection (cultures, biopsies, blood, body fluids, etc.) '[]'$  - 0 Specimen(s) / Culture(s) sent or taken to Lab for analysis '[]'$  - 0 Patient Transfer (multiple staff / Civil Service fast streamer / Similar devices) '[]'$  - 0 Simple Staple / Suture removal (25 or less) '[]'$  - 0 Complex Staple / Suture removal (26 or more) '[]'$  - 0 Hypo / Hyperglycemic Management (close monitor of Blood Glucose) '[]'$  - 0 Ankle / Brachial Index (ABI) - do not check if billed separately X- 1 5 Vital Signs Has the patient been seen at the hospital within the last three years: Yes Total Score: 70 Level Of Care: New/Established - Level 2 Electronic Signature(s) Signed: 03/08/2022 4:44:47 PM By: Massie Kluver Entered By: Massie Kluver on 03/06/2022 15:42:13 Olivier, [name] ([id number]) -------------------------------------------------------------------------------- Encounter Discharge Information Details Patient Name: [name], [name] Date of Service: 03/06/2022 3:00 PM Medical Record Number: [id number] Patient Account Number: [id number] Date of Birth/Sex: [birth year] (88 y.o. F) Treating RN: Cornell Barman Primary Care Ande Therrell: Viviana Simpler Other Clinician: Massie Kluver Referring Gabbie Marzo: Viviana Simpler Treating Trayonna Bachmeier/Extender: Skipper Cliche in Treatment: 4 Encounter Discharge Information Items Discharge Condition: Stable Ambulatory Status: Walker Discharge Destination: Home Transportation: Private  Auto Accompanied By: daughter Schedule Follow-up Appointment: Yes Clinical Summary of Care: Electronic Signature(s) Signed: 03/08/2022 4:44:47 PM By: Massie Kluver Entered By: Massie Kluver on 03/06/2022 15:48:40 Ricklefs, [name] ([id number]) -------------------------------------------------------------------------------- Lower Extremity Assessment Details Patient Name: [name], [name] Date of Service: 03/06/2022 3:00 PM Medical Record Number: [id number] Patient Account Number: [id number] Date of Birth/Sex: 1927/10/25 (88 y.o. F) Treating RN: Cornell Barman Primary Care Nathan Stallworth: Viviana Simpler Other Clinician: Massie Kluver Referring Lavonia Eager: Viviana Simpler Treating Lezley Bedgood/Extender: Skipper Cliche in Treatment: 4 Electronic Signature(s) Signed: 03/08/2022 4:44:47 PM By: Massie Kluver Signed: 03/08/2022 6:20:35 PM By: Gretta Cool, BSN, RN, CWS, Kim RN, BSN Entered By: Massie Kluver on 03/06/2022 15:36:38 Biffle, [name] ([id number]) -------------------------------------------------------------------------------- Multi Wound Chart Details Patient Name: [name], [name] Date of Service: 03/06/2022 3:00 PM Medical Record Number: [id number] Patient Account Number: [id number] Date of Birth/Sex: [birth year] (88 y.o. F) Treating RN: Cornell Barman Primary Care Yzabella Crunk: Viviana Simpler Other Clinician: Massie Kluver Referring Ruchel Brandenburger: Viviana Simpler Treating Arien Benincasa/Extender: Skipper Cliche in Treatment: 4 Vital Signs Height(in): 66 Pulse(bpm): 74 Weight(lbs): 54 Blood Pressure(mmHg): 155/72 Body Mass Index(BMI): 13.7 Temperature(F): 97.8 Respiratory Rate(breaths/min): 16 Photos: [N/A:N/A] Wound Location: Left Lower Leg N/A N/A Wounding Event: Trauma N/A N/A Primary Etiology: Trauma, Other N/A N/A Comorbid History: Arrhythmia, Coronary Artery N/A N/A Disease, Hypertension, Peripheral Arterial Disease, History of pressure wounds, Osteoarthritis Date Acquired: 01/14/2022 N/A  N/A Weeks of Treatment: 4 N/A N/A Wound Status: Healed - Epithelialized N/A N/A Wound Recurrence: No N/A N/A Measurements L x W x D (cm) 0x0x0 N/A N/A Area (cm) : 0 N/A N/A Volume (cm) : 0 N/A N/A % Reduction in Area: 100.00% N/A N/A % Reduction in Volume: 100.00% N/A N/A Classification: Full Thickness Without Exposed N/A N/A Support Structures Exudate Amount: None Present N/A N/A Granulation Amount: None Present (0%) N/A N/A Necrotic Amount: None Present (0%) N/A N/A Exposed Structures: Fat Layer (Subcutaneous Tissue): N/A N/A Yes  Fascia: No Tendon: No Muscle: No Joint: No Bone: No Epithelialization: Large (67-100%) N/A N/A Treatment Notes Electronic Signature(s) Signed: 03/08/2022 4:44:47 PM By: Massie Kluver Entered By: Massie Kluver on 03/06/2022 15:38:41 Wainright, [name] ([id number]) -------------------------------------------------------------------------------- [place] Details Patient Name: [name], [name] Date of Service: 03/06/2022 3:00 PM Medical Record Number: [id number] Patient Account Number: [id number] Date of Birth/Sex: June 06, 1928 (88 y.o. F) Treating RN: Cornell Barman Primary Care Maritta Kief: Viviana Simpler Other Clinician: Massie Kluver Referring Anaika Santillano: Viviana Simpler Treating Tunya Held/Extender: Skipper Cliche in Treatment: 4 Active Inactive Electronic Signature(s) Signed: 03/08/2022 4:44:47 PM By: Massie Kluver Signed: 03/08/2022 6:20:35 PM By: Gretta Cool, BSN, RN, CWS, Kim RN, BSN Entered By: Massie Kluver on 03/06/2022 15:38:32 Bansal, [name] ([id number]) -------------------------------------------------------------------------------- Pain Assessment Details Patient Name: [name], [name] Date of Service: 03/06/2022 3:00 PM Medical Record Number: [id number] Patient Account Number: [id number] Date of Birth/Sex: [birth year] (88 y.o. F) Treating RN: Cornell Barman Primary Care Thanvi Blincoe: Viviana Simpler Other Clinician: Massie Kluver Referring Randee Huston: Viviana Simpler Treating Rani Idler/Extender: Skipper Cliche in Treatment: 4 Active Problems Location of Pain Severity and Description of Pain Patient Has Paino No Site Locations Pain Management and Medication Current Pain Management: Electronic Signature(s) Signed: 03/08/2022 4:44:47 PM By: Massie Kluver Signed: 03/08/2022 6:20:35 PM By: Gretta Cool, BSN, RN, CWS, Kim RN, BSN Entered By: Massie Kluver on 03/06/2022 15:28:59 [name] ([id number]) -------------------------------------------------------------------------------- Patient/Caregiver Education Details Patient Name: [name], [name] Date of Service: 03/06/2022 3:00 PM Medical Record Number: [id number] Patient Account Number: [id number] Date of Birth/Gender: [birth year] (88 y.o. F) Treating RN: Cornell Barman Primary Care Physician: Viviana Simpler Other Clinician: Massie Kluver Referring Physician: Viviana Simpler Treating Physician/Extender: Skipper Cliche in Treatment: 4 Education Assessment Education Provided To: Patient Education Topics Provided Wound/Skin Impairment: Handouts: Other: wound healed. Please call if any further issues arise Electronic Signature(s) Signed: 03/08/2022 4:44:47 PM By: Massie Kluver Entered By: Massie Kluver on 03/06/2022 15:42:47 Aldous, [name] ([id number]) -------------------------------------------------------------------------------- Wound Assessment Details Patient Name: [name], [name] Date of Service: 03/06/2022 3:00 PM Medical Record Number: [id number] Patient Account Number: [id number] Date of Birth/Sex: 25-Apr-1928 (88 y.o. F) Treating RN: Cornell Barman Primary Care Per Beagley: Viviana Simpler Other Clinician: Massie Kluver Referring Karsten Vaughn: Viviana Simpler Treating Indianna Boran/Extender: Skipper Cliche in Treatment: 4 Wound Status Wound Number: 7 Primary Trauma, Other Etiology: Wound Location: Left Lower Leg Wound Healed -  Epithelialized Wounding Event: Trauma Status: Date Acquired: 01/14/2022 Comorbid Arrhythmia, Coronary Artery Disease, Hypertension, Weeks Of Treatment: 4 History: Peripheral Arterial Disease, History of pressure wounds, Clustered Wound: No Osteoarthritis Photos Wound Measurements Length: (cm) 0 Width: (cm) 0 Depth: (cm) 0 Area: (cm) 0 Volume: (cm) 0 % Reduction in Area: 100% % Reduction in Volume: 100% Epithelialization: Large (67-100%) Tunneling: No Undermining: No Wound Description Classification: Full Thickness Without Exposed Support Structures Exudate Amount: None Present Foul Odor After Cleansing: No Slough/Fibrino No Wound Bed Granulation Amount: None Present (0%) Exposed Structure Necrotic Amount: None Present (0%) Fascia Exposed: No Fat Layer (Subcutaneous Tissue) Exposed: Yes Tendon Exposed: No Muscle Exposed: No Joint Exposed: No Bone Exposed: No Treatment Notes Wound #7 (Lower Leg) Wound Laterality: Left Cleanser Peri-Wound Care Topical Primary Dressing [name], [name] ([id number]) Secondary Dressing Secured With Compression Wrap Compression Stockings Add-Ons Electronic Signature(s) Signed: 03/08/2022 4:44:47 PM By: Massie Kluver Signed: 03/08/2022 6:20:35 PM By: Gretta Cool, BSN, RN, CWS, Kim RN, BSN Entered By: Massie Kluver on 03/06/2022 15:36:29 Menon, [name] ([id number]) -------------------------------------------------------------------------------- [place] Details Patient Name: THRESSA, SHIFFER Date of Service: 03/06/2022  3:00 PM Medical Record Number: [id number] Patient Account Number: [id number] Date of Birth/Sex: 1928/06/17 (88 y.o. F) Treating RN: Cornell Barman Primary Care Yariel Ferraris: Viviana Simpler Other Clinician: Massie Kluver Referring Jen Benedict: Viviana Simpler Treating Ayvin Lipinski/Extender: Skipper Cliche in Treatment: 4 Vital Signs Time Taken: 15:28 Temperature (F): 97.8 Height (in): 66 Pulse (bpm): 74 Weight (lbs):  85 Respiratory Rate (breaths/min): 16 Body Mass Index (BMI): 13.7 Blood Pressure (mmHg): 155/72 Reference Range: 80 - 120 mg / dl Electronic Signature(s) Signed: 03/08/2022 4:44:47 PM By: Massie Kluver Entered By: Massie Kluver on 03/06/2022 15:28:54

## 2022-04-02 ENCOUNTER — Ambulatory Visit: Payer: PPO | Admitting: Internal Medicine

## 2022-04-16 ENCOUNTER — Other Ambulatory Visit: Payer: Self-pay | Admitting: Internal Medicine

## 2022-04-17 NOTE — Telephone Encounter (Signed)
Lorazepam 03-05-22 #60 Last OV 11-20-21 Next OV 05-14-22 [place]

## 2022-04-23 ENCOUNTER — Other Ambulatory Visit: Payer: Self-pay | Admitting: Internal Medicine

## 2022-04-23 NOTE — Telephone Encounter (Signed)
Name of Medication: MSIR Name of [facility]acy: [facility] or Written Date and Quantity: 02-12-22 #90 Last Office Visit and Type: 11-20-21 Next Office Visit and Type: 05-14-22 Last Controlled Substance Agreement Date: 09-29-20 Last UDS: 04-27-19

## 2022-04-26 ENCOUNTER — Ambulatory Visit: Payer: PPO | Admitting: Internal Medicine

## 2022-04-27 DIAGNOSIS — E46 Unspecified protein-calorie malnutrition: Secondary | ICD-10-CM | POA: Diagnosis not present

## 2022-04-27 DIAGNOSIS — B0229 Other postherpetic nervous system involvement: Secondary | ICD-10-CM | POA: Diagnosis not present

## 2022-04-27 DIAGNOSIS — Z681 Body mass index (BMI) 19 or less, adult: Secondary | ICD-10-CM | POA: Diagnosis not present

## 2022-04-27 DIAGNOSIS — Z515 Encounter for palliative care: Secondary | ICD-10-CM | POA: Diagnosis not present

## 2022-05-07 ENCOUNTER — Encounter (INDEPENDENT_AMBULATORY_CARE_PROVIDER_SITE_OTHER): Payer: Self-pay

## 2022-05-14 ENCOUNTER — Encounter: Payer: Self-pay | Admitting: Internal Medicine

## 2022-05-14 ENCOUNTER — Ambulatory Visit (INDEPENDENT_AMBULATORY_CARE_PROVIDER_SITE_OTHER): Payer: PPO | Admitting: Internal Medicine

## 2022-05-14 VITALS — BP 120/78 | HR 87 | Temp 98.1°F | Ht 65.0 in | Wt 81.0 lb

## 2022-05-14 DIAGNOSIS — B0229 Other postherpetic nervous system involvement: Secondary | ICD-10-CM | POA: Diagnosis not present

## 2022-05-14 DIAGNOSIS — F419 Anxiety disorder, unspecified: Secondary | ICD-10-CM | POA: Diagnosis not present

## 2022-05-14 NOTE — Assessment & Plan Note (Signed)
Sleeps okay with the lorazepam at bedtime

## 2022-05-14 NOTE — Progress Notes (Signed)
Subjective:    Patient ID: [name], female    DOB: 1927/10/09, 88 y.o.   MRN: [id number]  HPI Here for follow up of chronic pain--with DIL as usual  "I'm doing like I'm doing" Dizzy every morning---"feels like inner ear" (after getting up) Recurs in evening at times Has pain and itching in ears--but it wears off Still with daily headache Still having a fair bit of right eye pain  Chronic pain is reasonably controlled--"I can handle it" Takes the morphine just once a day mostly--feels tylenol does help now  Current Outpatient Medications on File Prior to Visit  Medication Sig Dispense Refill   acyclovir (ZOVIRAX) 200 MG capsule Take 1 capsule (200 mg total) by mouth 2 (two) times daily. 180 capsule 3   amLODipine (NORVASC) 10 MG tablet Take 1 tablet (10 mg total) by mouth daily. 90 tablet 3   antiseptic oral rinse (BIOTENE) LIQD 15 mLs by Mouth Rinse route as needed for dry mouth.     azelastine (ASTELIN) 0.1 % nasal spray Place 2 sprays into both nostrils 2 (two) times daily.     feeding supplement (ENSURE ENLIVE / ENSURE PLUS) LIQD Take 237 mLs by mouth 3 (three) times daily between meals. 237 mL 12   fexofenadine (ALLEGRA) 180 MG tablet Take 180 mg by mouth daily.     LORazepam (ATIVAN) 1 MG tablet TAKE 1/2 TO 1 TABLET BY MOUTH TWICE DAILY AS NEEDED FOR ANXIETY 60 tablet 0   morphine (MSIR) 15 MG tablet TAKE 1 TABLET BY MOUTH 3 TIMES DAILY AS NEEDED FOR PAIN 90 tablet 0   Multiple Vitamin (MULTIVITAMIN) tablet Take 1 tablet by mouth once daily     omeprazole (PRILOSEC) 20 MG capsule Take 1 capsule (20 mg total) by mouth daily. 90 capsule 3   polyethylene glycol (MIRALAX / GLYCOLAX) packet Take 17 g by mouth at bedtime.     RESTASIS 0.05 % ophthalmic emulsion Place 1 drop into both eyes 2 (two) times daily.     topiramate (TOPAMAX) 50 MG tablet TAKE 1 TABLET BY MOUTH AT BEDTIME FOR COMPLICATION OF SHINGLES 30 tablet 11   No current facility-administered medications on file  prior to visit.    Allergies  Allergen Reactions   Duloxetine     REACTION: N/V Mental status change and trouble with balance   Cymbalta [Duloxetine Hcl]     Unsure of reaction   Maxitrol [Neomycin-Polymyxin-Dexameth]     Unsure of reaction   Pregabalin     REACTION: SOB, swollen lips   Tobradex [Tobramycin-Dexamethasone]     Unsure of reaction    Past Medical History:  Diagnosis Date   Arrhythmia    Arthritis    Atypical chest pain    Lexiscan myoview (5/11) with EF 84%, normal wall motion, small fixed apical  perfusion defect likely due to breast attenuation, no evidence for ischemia or infarction.  **Patient had an   Basal cell carcinoma 07/28/19 [facility]   R pretibial   Breast cancer ([name])    Bilateral mastectomies 1986.   CVA (cerebral infarction) 10/12   right lacunar   Depression    Dyspnea    Echo (5/11) was a difficult study due to breast implants but showed normal LV and RV size and systolic function.       GERD (gastroesophageal reflux disease)    Headache    s/p shingles - right side of head   History of partial thyroidectomy    Hyperlipidemia  Hypertension    in past - no current meds/issues   Obstruction of intestine ([facility])    Partial small bowel obstruction ([place]) 7/14   no surgery   Superficial basal cell carcinoma 06/18/2019   R mid to distal pretibial   Wears dentures    full upper   Zoster     with Post-herpetic neuralgia    Past Surgical History:  Procedure Laterality Date   ABDOMINAL HYSTERECTOMY     BREAST SURGERY Bilateral    cancer - mastectomy and implant insertion   INTRAMEDULLARY (IM) NAIL INTERTROCHANTERIC Left 06/19/2020   Procedure: INTRAMEDULLARY (IM) NAIL INTERTROCHANTRIC;  Surgeon: Earnestine Leys, MD;  Location: [facility];  Service: Orthopedics;  Laterality: Left;   IR THORACENTESIS ASP PLEURAL SPACE W/IMG GUIDE  10/02/2021   MASTECTOMY  1980   bilateral   NECK SURGERY     TARSORRHAPHY Bilateral 08/16/2015   Procedure: MINOR  TARSORRAPHY LATERAL PLACEMENT;  Surgeon: Karle Starch, MD;  Location: [place];  Service: Ophthalmology;  Laterality: Bilateral;   VAGINAL DELIVERY      Family History  Problem Relation Age of Onset   Heart disease Son        open heart surgery for a blockage   Heart Problems Son    Diabetes Son    Parkinson's disease Son     Social History   Socioeconomic History   Marital status: Widowed    Spouse name: Not on file   Number of children: 2   Years of education: Not on file   Highest education level: Not on file  Occupational History   Occupation: Museum/gallery exhibitions officer: RETIRED  Tobacco Use   Smoking status: Former    Passive exposure: Never   Smokeless tobacco: Never  Substance and Sexual Activity   Alcohol use: No    Alcohol/week: 0.0 standard drinks of alcohol   Drug use: No   Sexual activity: Not Currently  Other Topics Concern   Not on file  Social History Narrative   No living will   No health care POA but requests daughter-in-law [name] to do this   Would like attempts at resuscitation   No feeding tube       MOST form done 08/17/21---she wants everything except tube feeds   Social Determinants of Health   Financial Resource Strain: Not on file  Food Insecurity: Not on file  Transportation Needs: Not on file  Physical Activity: Not on file  Stress: Not on file  Social Connections: Not on file  Intimate Partner Violence: Not on file   Review of Systems Bowels moving okay Appetite is "not as good as it was"-----has some trouble swallowing and mouth is dry Is able to sleep okay    Objective:   Physical Exam Constitutional:      Appearance: Normal appearance.  HENT:     Head:     Comments: No vesicles on head    Right Ear: Tympanic membrane and ear canal normal.     Left Ear: Tympanic membrane and ear canal normal.  Eyes:     Comments: Right eye fused shut  Neurological:     Mental Status: She is alert.  Psychiatric:        Mood  and Affect: Mood normal.            Assessment & Plan:

## 2022-05-14 NOTE — Assessment & Plan Note (Signed)
From zoster---chronic Doing some better with the topiramate 50 at bedtime Morphine once a day plus the tylenol

## 2022-05-21 DIAGNOSIS — M3501 Sicca syndrome with keratoconjunctivitis: Secondary | ICD-10-CM | POA: Diagnosis not present

## 2022-06-29 ENCOUNTER — Other Ambulatory Visit: Payer: Self-pay | Admitting: Internal Medicine

## 2022-07-05 NOTE — Telephone Encounter (Signed)
Patient daughter-in-law called in to follow up on this request. She needs it by tomorrow afternoon. Thank you!

## 2022-07-12 ENCOUNTER — Other Ambulatory Visit: Payer: Self-pay | Admitting: Internal Medicine

## 2022-07-12 NOTE — Telephone Encounter (Signed)
Name of Medication: MSIR Name of [facility]acy: [facility] Written Date and Quantity: 04-23-22 #90 Last Office Visit and Type: 05-14-22 Next Office Visit and Type: 10-29-22 Last Controlled Substance Agreement Date: 09-29-20 Last UDS: 04-27-19

## 2022-07-27 ENCOUNTER — Other Ambulatory Visit: Payer: Self-pay | Admitting: Internal Medicine

## 2022-07-30 ENCOUNTER — Telehealth: Payer: Self-pay | Admitting: Internal Medicine

## 2022-07-30 MED ORDER — FLUTICASONE PROPIONATE 50 MCG/ACT NA SUSP: 2.0000 | Freq: Every day | NASAL | 5 refills | Status: DC | PRN

## 2022-07-30 NOTE — Telephone Encounter (Signed)
[name] called in to follow up on this refill request. She stated that she is out of the medication. Thank you!

## 2022-07-30 NOTE — Addendum Note (Signed)
Addended by: Pilar Grammes on: 07/30/2022 04:10 PM   Modules accepted: Orders

## 2022-07-30 NOTE — Telephone Encounter (Signed)
Rx sent electronically 

## 2022-08-13 ENCOUNTER — Other Ambulatory Visit: Payer: Self-pay | Admitting: Internal Medicine

## 2022-08-29 ENCOUNTER — Ambulatory Visit: Payer: PPO | Admitting: Dermatology

## 2022-09-03 ENCOUNTER — Other Ambulatory Visit: Payer: Self-pay | Admitting: Internal Medicine

## 2022-09-03 DIAGNOSIS — M3501 Sicca syndrome with keratoconjunctivitis: Secondary | ICD-10-CM | POA: Diagnosis not present

## 2022-09-03 MED ORDER — LORAZEPAM 1 MG PO TABS: 0.5000 mg | ORAL_TABLET | Freq: Two times a day (BID) | ORAL | 0 refills | Status: DC | PRN

## 2022-09-03 NOTE — Telephone Encounter (Signed)
Built to be called in once approved by Dr Silvio Pate.

## 2022-09-03 NOTE — Telephone Encounter (Signed)
Refill left on VM at pharmacy.

## 2022-09-03 NOTE — Telephone Encounter (Signed)
Prescription Request  09/03/2022  Is this a "Controlled Substance" medicine? No  LOV: 05/14/2022  What is the name of the medication or equipment? LORazepam (ATIVAN) 1 MG tablet   Have you contacted your pharmacy to request a refill? Yes  Pharmacy called to refill meds, requesting meds to be refilled verbally   Which pharmacy would you like this sent to?  [facility], [address] [place] [address] Phone: [phone] Fax: [phone]    Patient notified that their request is being sent to the clinical staff for review and that they should receive a response within 2 business days.   Please advise at [phone]

## 2022-10-01 ENCOUNTER — Other Ambulatory Visit: Payer: Self-pay | Admitting: Internal Medicine

## 2022-10-01 NOTE — Telephone Encounter (Signed)
Name of Medication: MSIR Name of [facility]acy: [facility] Date and Quantity: 07-12-22 #90 Last Office Visit and Type: 05-14-22 Next Office Visit and Type: 10-29-22 Last Controlled Substance Agreement Date: 09-29-20 Last UDS: 04-27-19

## 2022-10-29 ENCOUNTER — Other Ambulatory Visit: Payer: Self-pay | Admitting: Internal Medicine

## 2022-10-29 ENCOUNTER — Encounter: Payer: Self-pay | Admitting: Internal Medicine

## 2022-10-29 ENCOUNTER — Ambulatory Visit (INDEPENDENT_AMBULATORY_CARE_PROVIDER_SITE_OTHER): Payer: PPO | Admitting: Internal Medicine

## 2022-10-29 VITALS — BP 118/70 | HR 70 | Temp 97.6°F | Ht 63.0 in | Wt 85.0 lb

## 2022-10-29 DIAGNOSIS — E440 Moderate protein-calorie malnutrition: Secondary | ICD-10-CM | POA: Diagnosis not present

## 2022-10-29 DIAGNOSIS — B0229 Other postherpetic nervous system involvement: Secondary | ICD-10-CM

## 2022-10-29 DIAGNOSIS — F1320 Sedative, hypnotic or anxiolytic dependence, uncomplicated: Secondary | ICD-10-CM | POA: Diagnosis not present

## 2022-10-29 DIAGNOSIS — F39 Unspecified mood [affective] disorder: Secondary | ICD-10-CM | POA: Diagnosis not present

## 2022-10-29 DIAGNOSIS — I739 Peripheral vascular disease, unspecified: Secondary | ICD-10-CM

## 2022-10-29 DIAGNOSIS — F1120 Opioid dependence, uncomplicated: Secondary | ICD-10-CM

## 2022-10-29 DIAGNOSIS — Z0000 Encounter for general adult medical examination without abnormal findings: Secondary | ICD-10-CM

## 2022-10-29 DIAGNOSIS — I5032 Chronic diastolic (congestive) heart failure: Secondary | ICD-10-CM

## 2022-10-29 DIAGNOSIS — I10 Essential (primary) hypertension: Secondary | ICD-10-CM

## 2022-10-29 DIAGNOSIS — I471 Supraventricular tachycardia, unspecified: Secondary | ICD-10-CM | POA: Diagnosis not present

## 2022-10-29 MED ORDER — AMLODIPINE BESYLATE 5 MG PO TABS: 5.0000 mg | ORAL_TABLET | Freq: Every day | ORAL | 3 refills | Status: AC

## 2022-10-29 NOTE — Assessment & Plan Note (Signed)
Mostly anxiety with the pain Uses the lorazepam

## 2022-10-29 NOTE — Assessment & Plan Note (Signed)
Continues on acyclovir, topiramate and MSO4 for pain

## 2022-10-29 NOTE — Assessment & Plan Note (Signed)
I have personally reviewed the Medicare Annual Wellness questionnaire and have noted 1. The patient's medical and social history 2. Their use of alcohol, tobacco or illicit drugs 3. Their current medications and supplements 4. The patient's functional ability including ADL's, fall risks, home safety risks and hearing or visual             impairment. 5. Diet and physical activities 6. Evidence for depression or mood disorders  The patients weight, height, BMI and visual acuity have been recorded in the chart I have made referrals, counseling and provided education to the patient based review of the above and I have provided the pt with a written personalized care plan for preventive services.  I have provided you with a copy of your personalized plan for preventive services. Please take the time to review along with your updated medication list.  Done with cancer screening due to age Updated COVID vaccine soon Flu and RSV vaccines in the fall Not able to exercise

## 2022-10-29 NOTE — Assessment & Plan Note (Signed)
No symptomatic recurrence 

## 2022-10-29 NOTE — Progress Notes (Signed)
Subjective:    Patient ID: [name], female    DOB: 1927/07/22, 89 y.o.   MRN: [id number]  HPI Here with DIL [name] for Medicare wellness visit and follow up of chronic health conditions Reviewed advanced directives Reviewed other doctors---Dr Brasington--ophthal, Dr Sylvie Farrier, Dr Wellington Hampshire  No hospitalizations or surgery in the past year Vision is not good--and nothing in right due to the zoster Hearing is poor--doesn't want hearing aides No alcohol or tobacco Not able to exercise No falls No depression but has some anhedonia (satisfied) Memory is not great--DIL notes mild confusion   Shingles pain persists ---maybe not as bad in past few days Uses the morphine in the morning--but doesn' t use it later (unless really bad headache) Will use tylenol Still uses the lorazepam at night to help sleep---doesn't let her sleep all night anymore though. Usually 10PM-4AM now. No naps  Still in own home DIL/son bring her all food---DIL there in AM--gets food ready, housekeeping. Son goes in the evening Prepared food---microwave DIL helps with shower (every 2 weeks or so)---but she uses soap containing pads she uses for sponge bath  Having itching in ears--and in nose Did see ENT in past Is taking the antihistamines Tried some peroxide to clean also  Some trouble swallowing---even choked last week No heartburn on the omeprazole daily (but not necessarily fasting)  No longer has sore on foot No longer sees wound clinic She does swell in legs  No chest pain or SOB Some dizziness but no syncope  Current Outpatient Medications on File Prior to Visit  Medication Sig Dispense Refill   amLODipine (NORVASC) 10 MG tablet TAKE 1 TABLET BY MOUTH DAILY 90 tablet 3   antiseptic oral rinse (BIOTENE) LIQD 15 mLs by Mouth Rinse Downs as needed for dry mouth.     azelastine (ASTELIN) 0.1 % nasal spray Place 2 sprays into both nostrils 2 (two) times daily.     feeding supplement  (ENSURE ENLIVE / ENSURE PLUS) LIQD Take 237 mLs by mouth 3 (three) times daily between meals. 237 mL 12   fexofenadine (ALLEGRA) 180 MG tablet Take 180 mg by mouth daily.     fluticasone (FLONASE) 50 MCG/ACT nasal spray Place 2 sprays into both nostrils daily as needed. 16 g 5   LORazepam (ATIVAN) 1 MG tablet Take 0.5-1 tablets (0.5-1 mg total) by mouth 2 (two) times daily as needed. for anxiety 60 tablet 0   morphine (MSIR) 15 MG tablet TAKE ONE TABLET BY MOUTH THREE TIMES DAILY AS NEEDED FOR PAIN 90 tablet 0   Multiple Vitamin (MULTIVITAMIN) tablet Take 1 tablet by mouth once daily     polyethylene glycol (MIRALAX / GLYCOLAX) packet Take 17 g by mouth at bedtime.     RESTASIS 0.05 % ophthalmic emulsion Place 1 drop into both eyes 2 (two) times daily.     No current facility-administered medications on file prior to visit.    Allergies  Allergen Reactions   Duloxetine     REACTION: N/V Mental status change and trouble with balance   Cymbalta [Duloxetine Hcl]     Unsure of reaction   Maxitrol [Neomycin-Polymyxin-Dexameth]     Unsure of reaction   Pregabalin     REACTION: SOB, swollen lips   Tobradex [Tobramycin-Dexamethasone]     Unsure of reaction    Past Medical History:  Diagnosis Date   Arrhythmia    Arthritis    Atypical chest pain    Lexiscan myoview (5/11) with EF 84%,  normal wall motion, small fixed apical  perfusion defect likely due to breast attenuation, no evidence for ischemia or infarction.  **Patient had an   Basal cell carcinoma 07/28/19 [facility]   R pretibial   Breast cancer    Bilateral mastectomies 1986.   CVA (cerebral infarction) 10/12   right lacunar   Depression    Dyspnea    Echo (5/11) was a difficult study due to breast implants but showed normal LV and RV size and systolic function.       GERD (gastroesophageal reflux disease)    Headache    s/p shingles - right side of head   History of partial thyroidectomy    Hyperlipidemia    Hypertension    in  past - no current meds/issues   Obstruction of intestine    Partial small bowel obstruction 7/14   no surgery   Superficial basal cell carcinoma 06/18/2019   R mid to distal pretibial   Wears dentures    full upper   Zoster     with Post-herpetic neuralgia    Past Surgical History:  Procedure Laterality Date   ABDOMINAL HYSTERECTOMY     BREAST SURGERY Bilateral    cancer - mastectomy and implant insertion   INTRAMEDULLARY (IM) NAIL INTERTROCHANTERIC Left 06/19/2020   Procedure: INTRAMEDULLARY (IM) NAIL INTERTROCHANTRIC;  Surgeon: Deeann Saint, MD;  Location: [facility];  Service: Orthopedics;  Laterality: Left;   IR THORACENTESIS ASP PLEURAL SPACE W/IMG GUIDE  10/02/2021   MASTECTOMY  1980   bilateral   NECK SURGERY     TARSORRHAPHY Bilateral 08/16/2015   Procedure: MINOR TARSORRAPHY LATERAL PLACEMENT;  Surgeon: Imagene Riches, MD;  Location: [facility];  Service: Ophthalmology;  Laterality: Bilateral;   VAGINAL DELIVERY      Family History  Problem Relation Age of Onset   Heart disease Son        open heart surgery for a blockage   Heart Problems Son    Diabetes Son    Parkinson's disease Son     Social History   Socioeconomic History   Marital status: Widowed    Spouse name: Not on file   Number of children: 2   Years of education: Not on file   Highest education level: Not on file  Occupational History   Occupation: Psychologist, educational: RETIRED  Tobacco Use   Smoking status: Former    Passive exposure: Never   Smokeless tobacco: Never  Substance and Sexual Activity   Alcohol use: No    Alcohol/week: 0.0 standard drinks of alcohol   Drug use: No   Sexual activity: Not Currently  Other Topics Concern   Not on file  Social History Narrative   No living will   No health care POA but requests  son and daughter-in-law [name] to do this   Would like attempts at resuscitation   No feeding tube       MOST form done 08/17/21---she wants everything  except tube feeds   Social Determinants of Health   Financial Resource Strain: Not on file  Food Insecurity: Not on file  Transportation Needs: Not on file  Physical Activity: Not on file  Stress: Not on file  Social Connections: Not on file  Intimate Partner Violence: Not on file   Review of Systems Appetite is okay Has gained back a few pounds Wears seat belt Teeth aren't good---not fitting well on top. Own teeth on bottom Has bumps on legs to  be checked Bowels move okay--no blood Some pain in elbows and shoulders--uses tylenol     Objective:   Physical Exam Constitutional:      Comments: Mild wasting  HENT:     Right Ear: Tympanic membrane and ear canal normal.     Left Ear: Tympanic membrane and ear canal normal.     Mouth/Throat:     Pharynx: No oropharyngeal exudate or posterior oropharyngeal erythema.  Eyes:     Conjunctiva/sclera: Conjunctivae normal.     Comments: Ptosis right eyelid  Cardiovascular:     Rate and Rhythm: Normal rate and regular rhythm.     Heart sounds: No murmur heard.    No gallop.     Comments: Feet warm without pulses Pulmonary:     Effort: Pulmonary effort is normal.     Breath sounds: Normal breath sounds. No wheezing or rales.  Abdominal:     Palpations: Abdomen is soft.     Tenderness: There is no abdominal tenderness.  Musculoskeletal:     Cervical back: Neck supple.     Comments: Trace edema--reedy skin changes in calves  Lymphadenopathy:     Cervical: No cervical adenopathy.  Skin:    Findings: No rash.  Neurological:     General: No focal deficit present.     Mental Status: She is alert and oriented to person, place, and time.     Comments: Word naming 9/1 minute Recall 3/3  Psychiatric:        Mood and Affect: Mood normal.        Behavior: Behavior normal.            Assessment & Plan:

## 2022-10-29 NOTE — Assessment & Plan Note (Signed)
BP Readings from Last 3 Encounters:  10/29/22 118/70  05/14/22 120/78  01/21/22 (!) 149/71   BP low Will decrease amlodipine to  daily

## 2022-10-29 NOTE — Assessment & Plan Note (Signed)
Has gained back a few pounds Eats okay

## 2022-10-29 NOTE — Assessment & Plan Note (Signed)
Uses the lorazepam nightly to sleep

## 2022-10-29 NOTE — Assessment & Plan Note (Signed)
Ankle ulcer healed No claudication but very sedentary

## 2022-10-29 NOTE — Assessment & Plan Note (Signed)
PDMP reviewed No concerns 

## 2022-10-29 NOTE — Assessment & Plan Note (Signed)
Has not been evident since all her weight loss

## 2022-10-30 LAB — CBC
HCT: 30.2 % — ABNORMAL LOW (ref 36.0–46.0)
Hemoglobin: 9.6 g/dL — ABNORMAL LOW (ref 12.0–15.0)
MCHC: 32 g/dL (ref 30.0–36.0)
MCV: 80.8 fl (ref 78.0–100.0)
Platelets: 209 10*3/uL (ref 150.0–400.0)
RBC: 3.73 Mil/uL — ABNORMAL LOW (ref 3.87–5.11)
RDW: 18.1 % — ABNORMAL HIGH (ref 11.5–15.5)
WBC: 6.5 10*3/uL (ref 4.0–10.5)

## 2022-10-30 LAB — COMPREHENSIVE METABOLIC PANEL
ALT: 9 U/L (ref 0–35)
AST: 15 U/L (ref 0–37)
Albumin: 4.1 g/dL (ref 3.5–5.2)
Alkaline Phosphatase: 72 U/L (ref 39–117)
BUN: 19 mg/dL (ref 6–23)
CO2: 23 mEq/L (ref 19–32)
Calcium: 8.8 mg/dL (ref 8.4–10.5)
Chloride: 108 mEq/L (ref 96–112)
Creatinine, Ser: 0.9 mg/dL (ref 0.40–1.20)
GFR: 55.2 mL/min — ABNORMAL LOW (ref >60.00)
Glucose, Bld: 100 mg/dL — ABNORMAL HIGH (ref 70–99)
Potassium: 4.1 mEq/L (ref 3.5–5.1)
Sodium: 139 mEq/L (ref 135–145)
Total Bilirubin: 0.2 mg/dL (ref 0.2–1.2)
Total Protein: 7.1 g/dL (ref 6.0–8.3)

## 2022-10-30 LAB — TSH: TSH: 2.11 u[IU]/mL (ref 0.35–5.50)

## 2022-11-05 ENCOUNTER — Other Ambulatory Visit: Payer: Self-pay | Admitting: Internal Medicine

## 2022-11-10 IMAGING — CR DG HIP (WITH OR WITHOUT PELVIS) 2-3V*L*
1 series · 3 of 3 positions shown · non-contrast
Comparison: January 20, 2013.

CLINICAL DATA: Left leg pain after fall.

EXAM:
DG HIP (WITH OR WITHOUT PELVIS) 2-3V LEFT

[Series 1: dg hip unilat w or w/o pelvis 2-3 views  · non-contrast · 0.14mm/px · 3 of 3 slices shown]
[im 1/3]
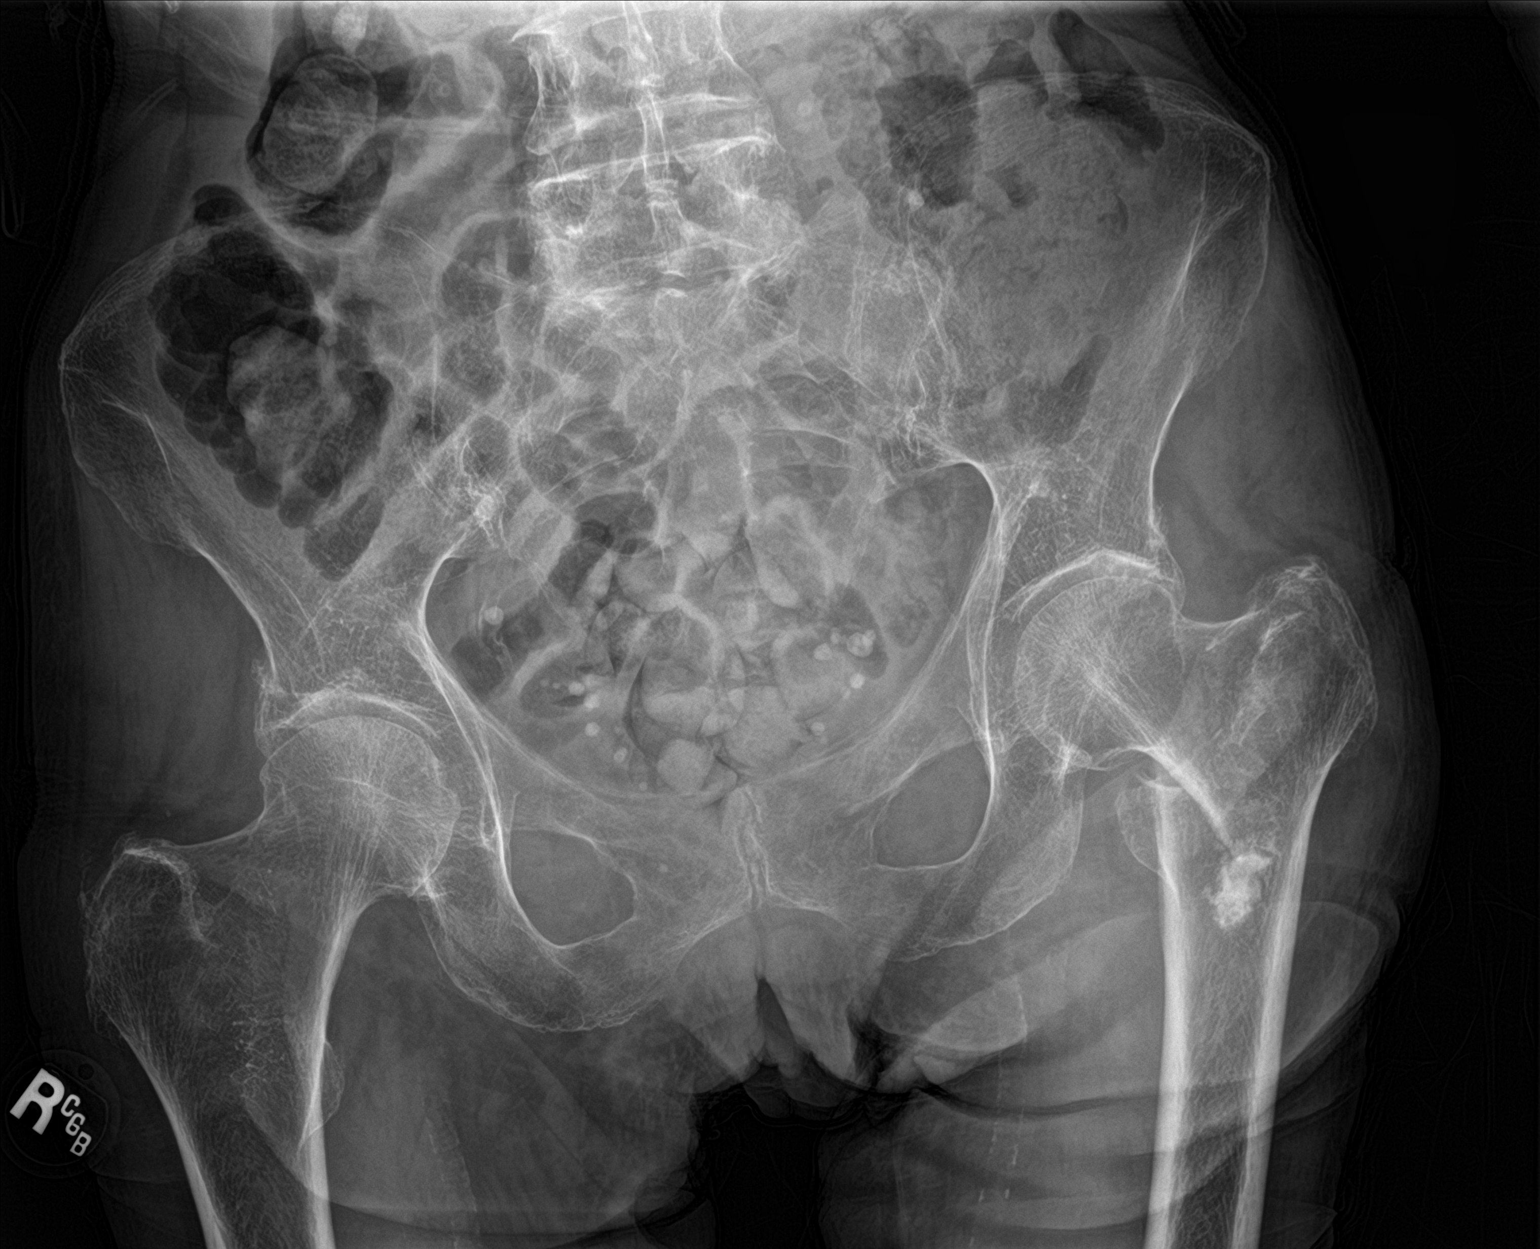
[im 2/3]
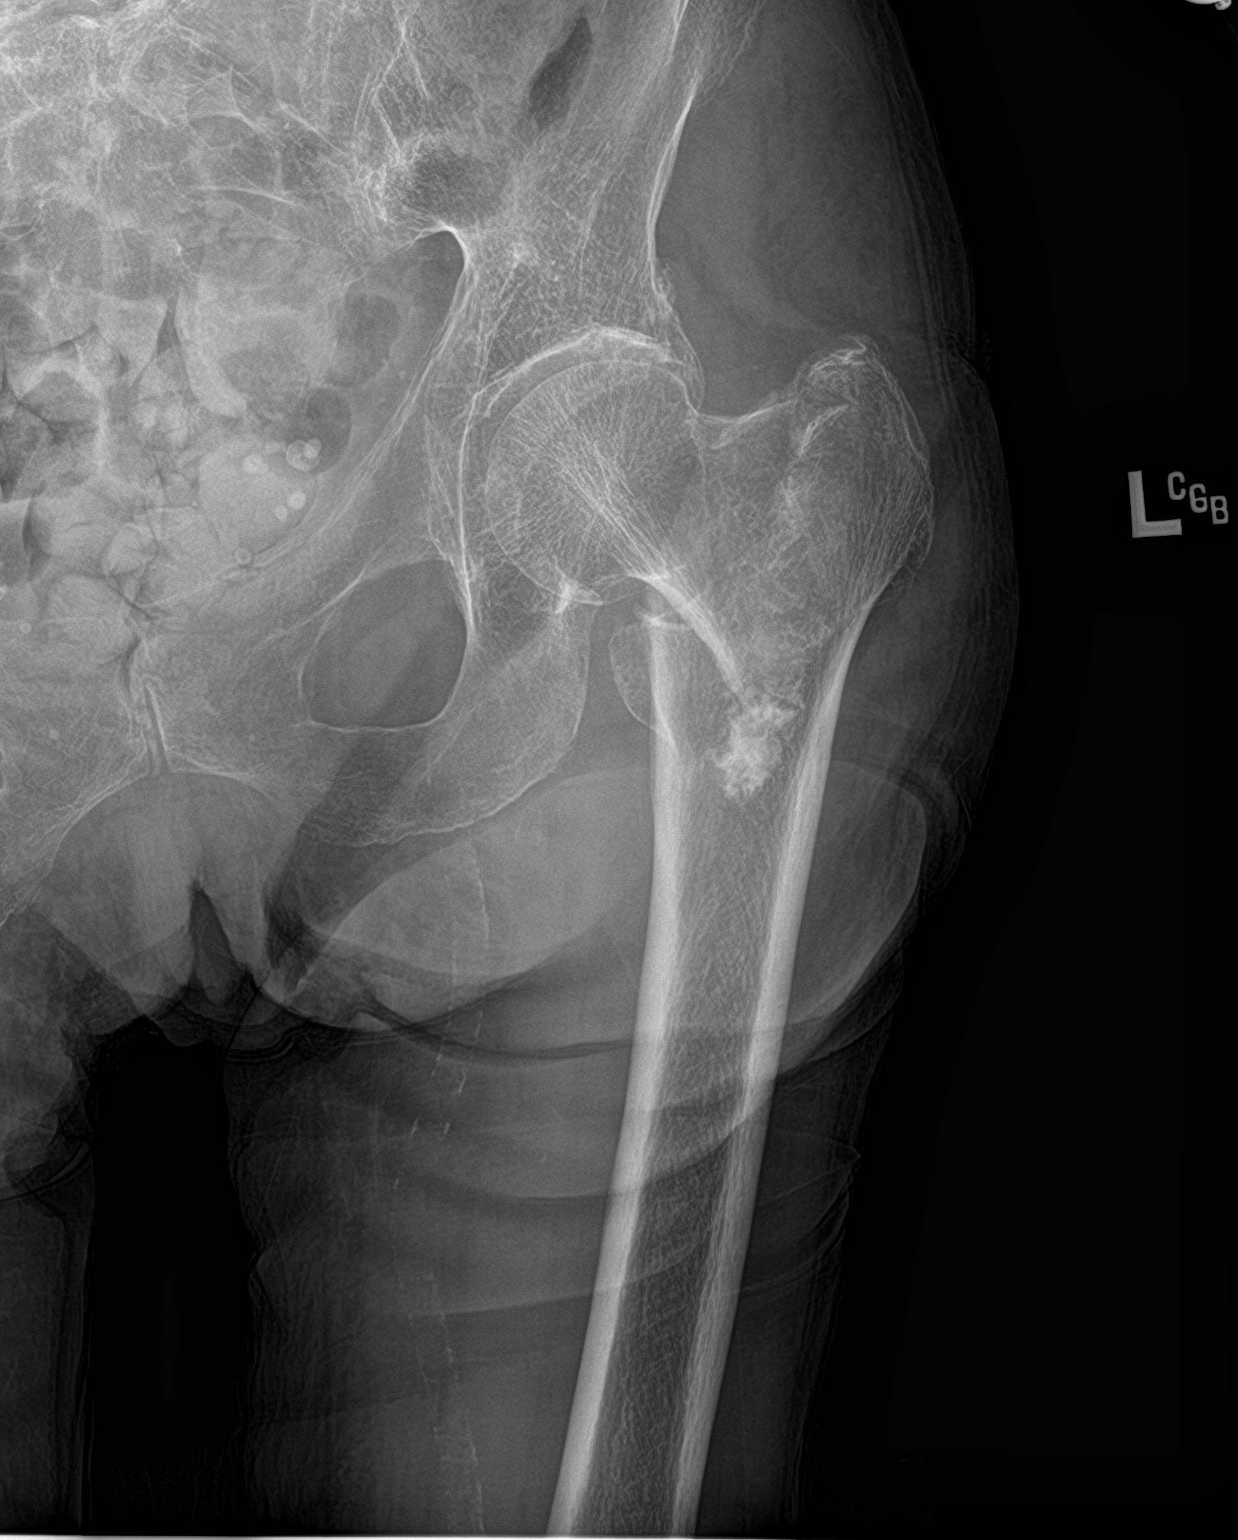
[im 3/3]
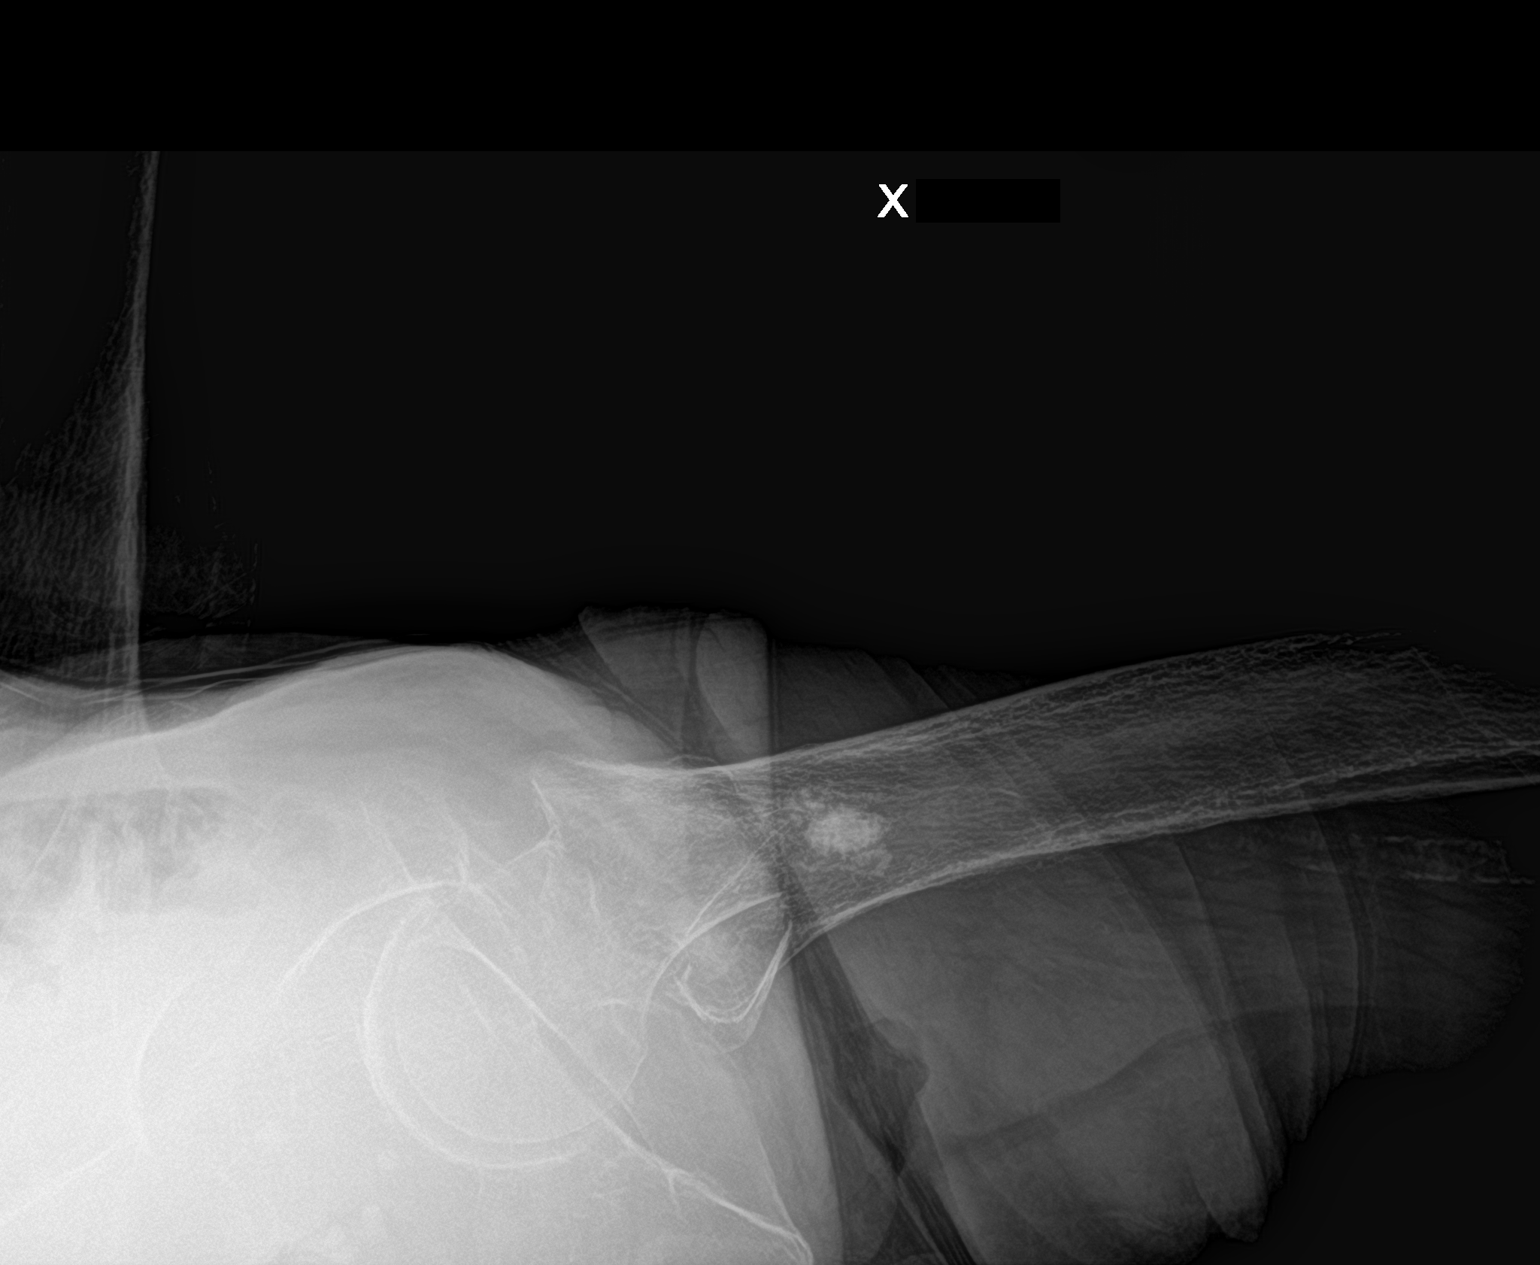

[3 of 3 positions shown; findings below may reference images not displayed]

FINDINGS: Moderately displaced and comminuted fracture is seen involving the
intertrochanteric region of the proximal left femur. Sclerotic focus
is noted in the proximal left femur which was present on prior exam
and is most consistent with benign enostosis.
IMPRESSION: Moderately displaced and comminuted intertrochanteric fracture of
proximal left femur.

## 2022-12-24 ENCOUNTER — Other Ambulatory Visit: Payer: Self-pay | Admitting: Internal Medicine

## 2022-12-28 DIAGNOSIS — D692 Other nonthrombocytopenic purpura: Secondary | ICD-10-CM | POA: Diagnosis not present

## 2022-12-28 DIAGNOSIS — Z515 Encounter for palliative care: Secondary | ICD-10-CM | POA: Diagnosis not present

## 2022-12-28 DIAGNOSIS — Z681 Body mass index (BMI) 19 or less, adult: Secondary | ICD-10-CM | POA: Diagnosis not present

## 2022-12-28 DIAGNOSIS — I10 Essential (primary) hypertension: Secondary | ICD-10-CM | POA: Diagnosis not present

## 2022-12-28 DIAGNOSIS — H5461 Unqualified visual loss, right eye, normal vision left eye: Secondary | ICD-10-CM | POA: Diagnosis not present

## 2022-12-31 ENCOUNTER — Other Ambulatory Visit: Payer: Self-pay | Admitting: Internal Medicine

## 2023-01-17 ENCOUNTER — Other Ambulatory Visit: Payer: Self-pay

## 2023-01-17 ENCOUNTER — Encounter: Payer: Self-pay | Admitting: Emergency Medicine

## 2023-01-17 ENCOUNTER — Emergency Department
Admission: EM | Admit: 2023-01-17 | Discharge: 2023-01-17 | Disposition: A | Discharge status: Home / Self Care | Payer: PPO | Attending: Emergency Medicine | Admitting: Emergency Medicine

## 2023-01-17 DIAGNOSIS — L03114 Cellulitis of left upper limb: Secondary | ICD-10-CM | POA: Insufficient documentation

## 2023-01-17 DIAGNOSIS — Z853 Personal history of malignant neoplasm of breast: Secondary | ICD-10-CM | POA: Insufficient documentation

## 2023-01-17 DIAGNOSIS — Z23 Encounter for immunization: Secondary | ICD-10-CM | POA: Diagnosis not present

## 2023-01-17 DIAGNOSIS — I10 Essential (primary) hypertension: Secondary | ICD-10-CM | POA: Insufficient documentation

## 2023-01-17 DIAGNOSIS — L089 Local infection of the skin and subcutaneous tissue, unspecified: Secondary | ICD-10-CM | POA: Diagnosis present

## 2023-01-17 LAB — CBC WITH DIFFERENTIAL/PLATELET
Abs Immature Granulocytes: 0.04 10*3/uL (ref 0.00–0.07)
Basophils Absolute: 0 10*3/uL (ref 0.0–0.1)
Basophils Relative: 1 %
Eosinophils Absolute: 0.1 10*3/uL (ref 0.0–0.5)
Eosinophils Relative: 1 %
HCT: 30 % — ABNORMAL LOW (ref 36.0–46.0)
Hemoglobin: 9.1 g/dL — ABNORMAL LOW (ref 12.0–15.0)
Immature Granulocytes: 1 %
Lymphocytes Relative: 26 %
Lymphs Abs: 1.8 10*3/uL (ref 0.7–4.0)
MCH: 25.8 pg — ABNORMAL LOW (ref 26.0–34.0)
MCHC: 30.3 g/dL (ref 30.0–36.0)
MCV: 85 fL (ref 80.0–100.0)
Monocytes Absolute: 0.8 10*3/uL (ref 0.1–1.0)
Monocytes Relative: 11 %
Neutro Abs: 4.4 10*3/uL (ref 1.7–7.7)
Neutrophils Relative %: 60 %
Platelets: 258 10*3/uL (ref 150–400)
RBC: 3.53 MIL/uL — ABNORMAL LOW (ref 3.87–5.11)
RDW: 17.4 % — ABNORMAL HIGH (ref 11.5–15.5)
WBC: 7.1 10*3/uL (ref 4.0–10.5)
nRBC: 0 % (ref 0.0–0.2)

## 2023-01-17 LAB — BASIC METABOLIC PANEL
Anion gap: 9 (ref 5–15)
BUN: 22 mg/dL (ref 8–23)
CO2: 21 mmol/L — ABNORMAL LOW (ref 22–32)
Calcium: 8.6 mg/dL — ABNORMAL LOW (ref 8.9–10.3)
Chloride: 110 mmol/L (ref 98–111)
Creatinine, Ser: 0.81 mg/dL (ref 0.44–1.00)
GFR, Estimated: >60 mL/min (ref >60)
Glucose, Bld: 122 mg/dL — ABNORMAL HIGH (ref 70–99)
Potassium: 3.6 mmol/L (ref 3.5–5.1)
Sodium: 140 mmol/L (ref 135–145)

## 2023-01-17 MED ORDER — TETANUS-DIPHTH-ACELL PERTUSSIS 5-2.5-18.5 LF-MCG/0.5 IM SUSY
0.5000 mL | PREFILLED_SYRINGE | Freq: Once | INTRAMUSCULAR | Status: AC
  Administered 2023-01-17: 0.5 mL via INTRAMUSCULAR
  Filled 2023-01-17: qty 0.5

## 2023-01-17 MED ORDER — CEPHALEXIN 500 MG PO CAPS
500.0000 mg | ORAL_CAPSULE | Freq: Once | ORAL | Status: AC
  Administered 2023-01-17: 500 mg via ORAL
  Filled 2023-01-17: qty 1

## 2023-01-17 MED ORDER — BACITRACIN ZINC 500 UNIT/GM EX OINT
TOPICAL_OINTMENT | Freq: Once | CUTANEOUS | Status: AC
Start: 2023-01-17 — End: 2023-01-17
  Filled 2023-01-17: qty 0.9

## 2023-01-17 MED ORDER — CEPHALEXIN 500 MG PO CAPS: 500.0000 mg | ORAL_CAPSULE | Freq: Four times a day (QID) | ORAL | 0 refills | Status: AC

## 2023-01-17 NOTE — ED Triage Notes (Signed)
Patient to ED via POV for wound to left hand. Pt states she hit it on a cabinet approx 2 weeks ago. Hand red with mal odor.

## 2023-01-17 NOTE — ED Provider Notes (Signed)
[facility] Provider Note    Event Date/Time   First MD Initiated Contact with Patient 01/17/23 1706     (approximate)   History   Wound Infection   HPI  [name] is a 89 y.o. female   Past medical history of remote back breast cancer, hypertension hyperlipidemia presents emerged department with wound infection to the left hand.  Injury sustained small skin tear about 2 weeks ago, wound has had difficulty healing and now with some red warmth surrounding the dorsum of the left hand.  She denies systemic symptoms like fevers or chills.  Unknown last tetanus.  Otherwise has been well, no other acute medical complaints.  Independent Historian contributed to assessment above: Her daughter-in-law is present to corroborate information given above and past medical history     Physical Exam   Triage Vital Signs: ED Triage Vitals [01/17/23 1628]  Encounter Vitals Group     BP (!) 140/68     Systolic BP Percentile      Diastolic BP Percentile      Pulse Rate 90     Resp 18     Temp 98.1 F (36.7 C)     Temp Source Oral     SpO2 100 %     Weight      Height      Head Circumference      Peak Flow      Pain Score 6     Pain Loc      Pain Education      Exclude from Growth Chart     Most recent vital signs: Vitals:   01/17/23 1628  BP: (!) 140/68  Pulse: 90  Resp: 18  Temp: 98.1 F (36.7 C)  SpO2: 100%    General: Awake, no distress.  CV:  Good peripheral perfusion.  Resp:  Normal effort.  Abd:  No distention.  Other:  Open wound to the dorsum of the left hand with surrounding cellulitic changes.  No crepitus or pain out of proportion.  Able to range at the wrist and all digits.   ED Results / Procedures / Treatments   Labs (all labs ordered are listed, but only abnormal results are displayed) Labs Reviewed  CBC WITH DIFFERENTIAL/PLATELET - Abnormal; Notable for the following components:      Result Value   RBC 3.53 (*)     Hemoglobin 9.1 (*)    HCT 30.0 (*)    MCH 25.8 (*)    RDW 17.4 (*)    All other components within normal limits  BASIC METABOLIC PANEL - Abnormal; Notable for the following components:   CO2 21 (*)    Glucose, Bld 122 (*)    Calcium 8.6 (*)    All other components within normal limits     I ordered and reviewed the above labs they are notable for white blood cell count is normal.  PROCEDURES:  Critical Care performed: No  Procedures   MEDICATIONS ORDERED IN ED: Medications  cephALEXin (KEFLEX) capsule 500 mg (has no administration in time range)  bacitracin ointment (has no administration in time range)  Tdap (BOOSTRIX) injection 0.5 mL (has no administration in time range)     IMPRESSION / MDM / ASSESSMENT AND PLAN / ED COURSE  I reviewed the triage vital signs and the nursing notes.  Patient's presentation is most consistent with acute complicated illness / injury requiring diagnostic workup.  Differential diagnosis includes, but is not limited to, cellulitis to the hand   The patient is on the cardiac monitor to evaluate for evidence of arrhythmia and/or significant heart rate changes.  MDM: Changes to hand with no systemic symptoms doubt sepsis.  No foreign body noted.  Doubt necrotizing fasciitis.  Keflex antibiotics, Tdap, dressing, follow-up with PMD.        FINAL CLINICAL IMPRESSION(S) / ED DIAGNOSES   Final diagnoses:  Cellulitis of left hand     Rx / DC Orders   ED Discharge Orders          Ordered    cephALEXin (KEFLEX) 500 MG capsule  4 times daily        01/17/23 1719             Note:  This document was prepared using Dragon voice recognition software and may include unintentional dictation errors.    Pilar Jarvis, MD 01/17/23 1723

## 2023-01-17 NOTE — Discharge Instructions (Addendum)
Take antibiotics as prescribed for the full 7-day course.  Please keep your wound clean by washing at least daily with soap and water. If you see any signs of worsening infection like spreading redness, pus coming from the wound, extreme pain, fevers, chills or any other worsening doctor right away or come back to the emergency department.  See your primary doctor within the next week to recheck the wound.  Thank you for choosing [place] for your health care today!  Please see your primary doctor this week for a follow up appointment.   If you have any new, worsening, or unexpected symptoms call your doctor right away or come back to the emergency department for reevaluation.  It was my pleasure to care for you today.   Daneil Dan Modesto Charon, MD

## 2023-01-22 ENCOUNTER — Telehealth: Payer: Self-pay

## 2023-01-22 NOTE — Telephone Encounter (Signed)
Transition Care Management Unsuccessful Follow-up Telephone Call  Date of discharge and from where:  01/17/2023 [facility]  Attempts:  1st Attempt  Reason for unsuccessful TCM follow-up call:  Left voice message  Shaelyn Decarli [facility]  [facility] Population Health Community Resource Care Guide   [email or web address]  ?? [phone]   Website: [email or web address]  [email or web address]

## 2023-01-23 ENCOUNTER — Telehealth: Payer: Self-pay

## 2023-01-23 NOTE — Telephone Encounter (Signed)
Transition Care Management Unsuccessful Follow-up Telephone Call  Date of discharge and from where:  01/17/2023 [facility]  Attempts:  2nd Attempt  Reason for unsuccessful TCM follow-up call:  Left voice message  Rashmi Tallent [facility]  [facility] Population Health Community Resource Care Guide   [email or web address]  ?? [phone]   Website: [email or web address]  [email or web address]

## 2023-02-18 ENCOUNTER — Other Ambulatory Visit: Payer: Self-pay | Admitting: Internal Medicine

## 2023-03-01 DIAGNOSIS — H6121 Impacted cerumen, right ear: Secondary | ICD-10-CM | POA: Diagnosis not present

## 2023-03-01 DIAGNOSIS — R42 Dizziness and giddiness: Secondary | ICD-10-CM | POA: Diagnosis not present

## 2023-03-01 DIAGNOSIS — R300 Dysuria: Secondary | ICD-10-CM | POA: Diagnosis not present

## 2023-03-05 DIAGNOSIS — N390 Urinary tract infection, site not specified: Secondary | ICD-10-CM | POA: Diagnosis not present

## 2023-03-06 DIAGNOSIS — N390 Urinary tract infection, site not specified: Secondary | ICD-10-CM | POA: Diagnosis not present

## 2023-03-06 DIAGNOSIS — H6120 Impacted cerumen, unspecified ear: Secondary | ICD-10-CM | POA: Diagnosis not present

## 2023-03-06 DIAGNOSIS — Z8679 Personal history of other diseases of the circulatory system: Secondary | ICD-10-CM | POA: Diagnosis not present

## 2023-03-07 DIAGNOSIS — R197 Diarrhea, unspecified: Secondary | ICD-10-CM | POA: Diagnosis not present

## 2023-03-07 DIAGNOSIS — R110 Nausea: Secondary | ICD-10-CM | POA: Diagnosis not present

## 2023-03-12 IMAGING — CT CT CERVICAL SPINE W/O CM
3 of 4 series · 12 of 33 positions shown, 14 images · non-contrast
Comparison: None.

CLINICAL DATA: Trauma, altered level of consciousness

EXAM:
CT CERVICAL SPINE WITHOUT CONTRAST
TECHNIQUE: Multidetector CT imaging of the cervical spine was performed without
intravenous contrast. Multiplanar CT image reconstructions were also
generated.

[Series 4: sagittal bone · sagittal · 0.22mm/px · 5 of 56 slices shown, 6 images]
[im 19/56  bone]
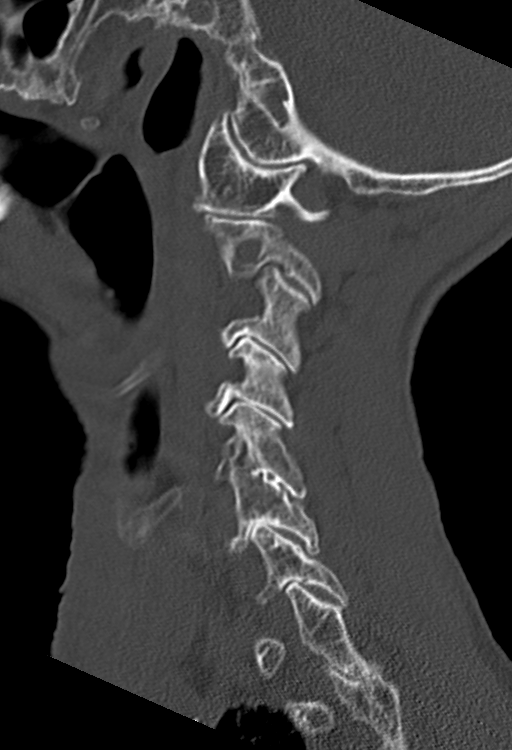
[im 23/56  bone]
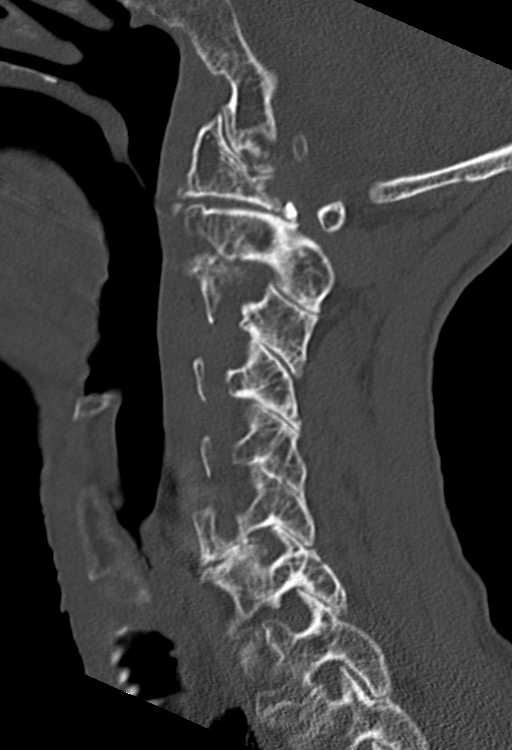
[im 28/56  soft-tissue]
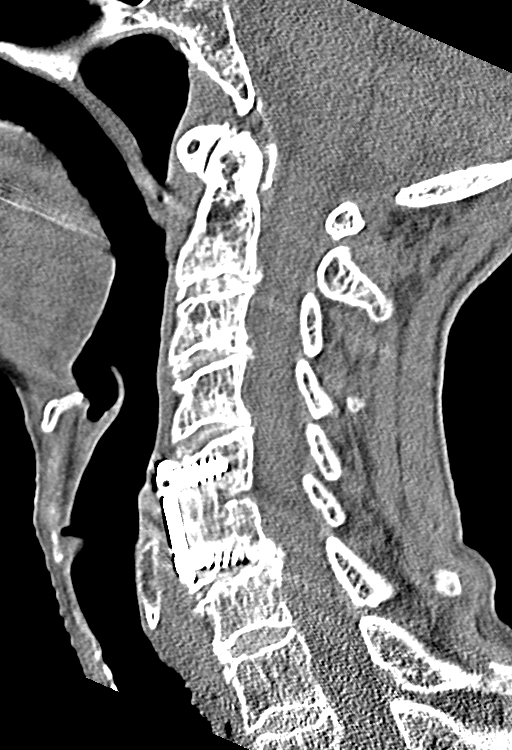
[im 28/56  bone]
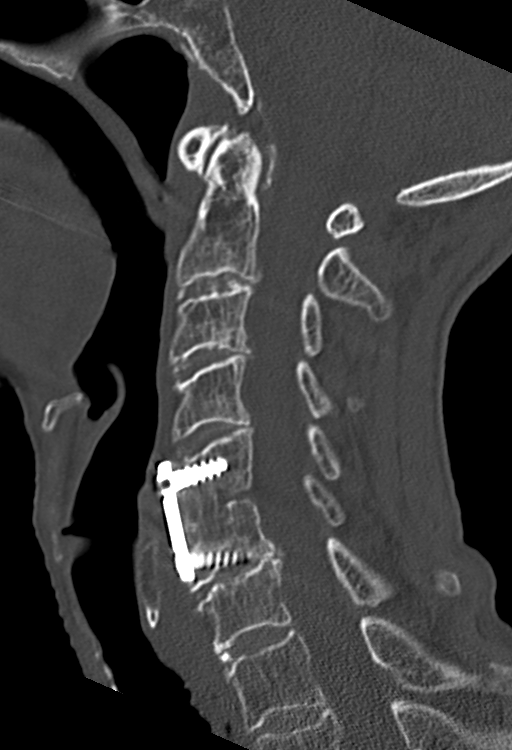
[im 33/56  bone]
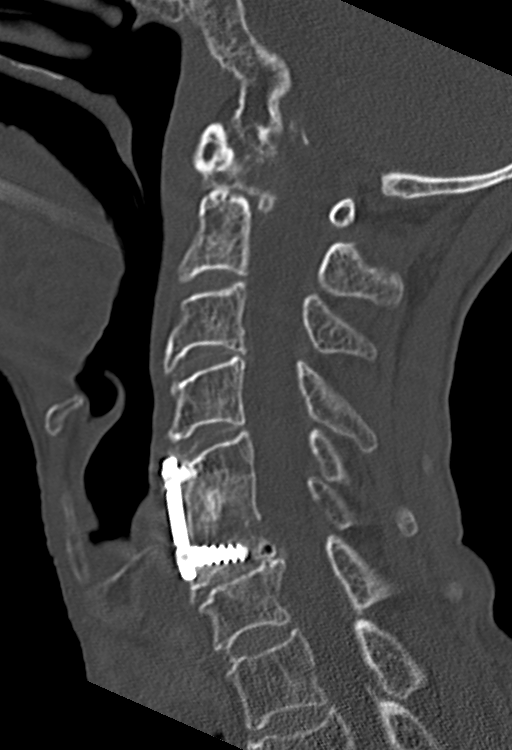
[im 37/56  bone]
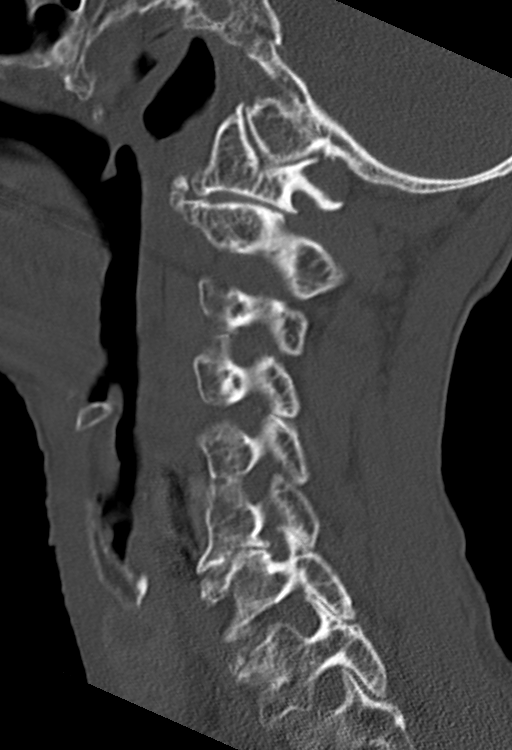

[Series 5: coronal bone · coronal · 0.21mm/px · 3 of 61 slices shown]
[im 13/61  bone]
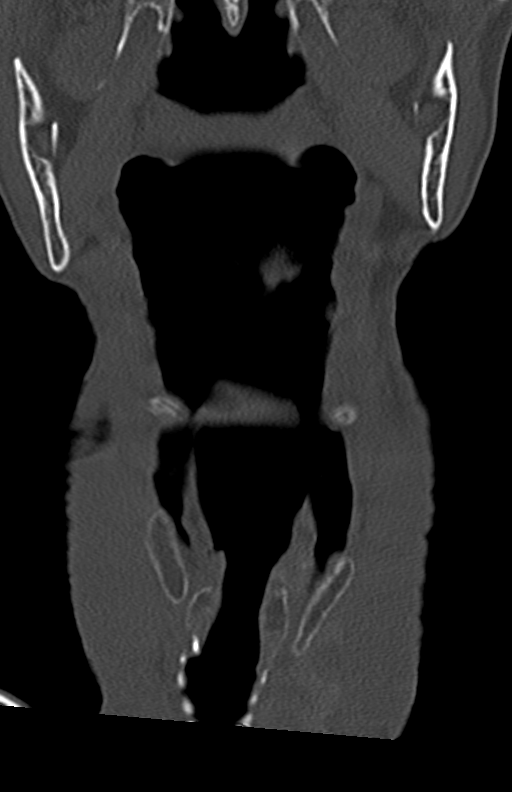
[im 25/61  bone]
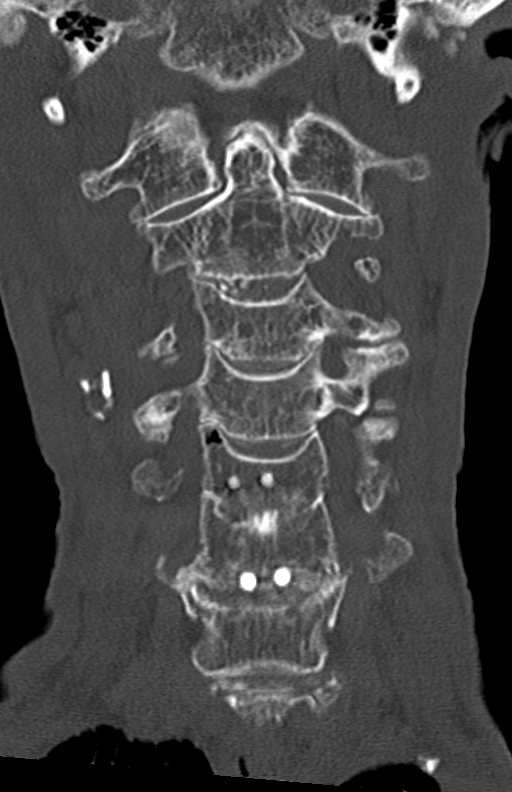
[im 37/61  bone]
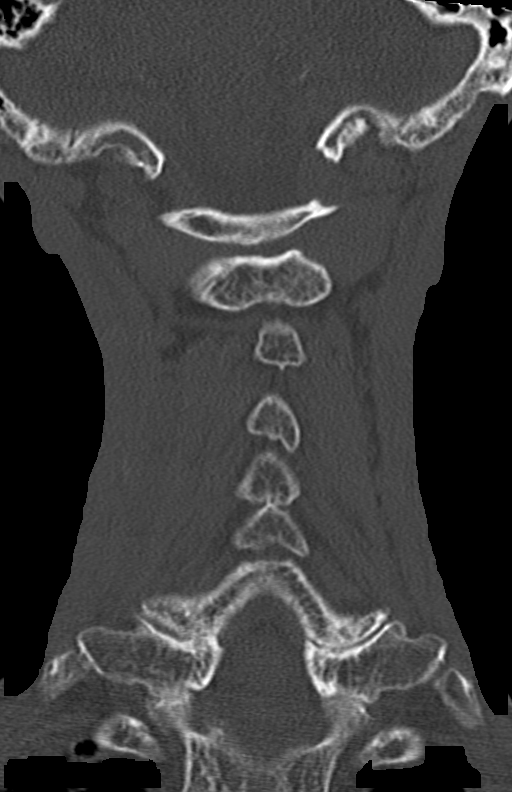

[Series 6: orthogonal bone · axial · 0.22mm/px · z∈[-286,-176]mm · 4 of 84 slices shown, 5 images]
[im 12/84  soft-tissue]
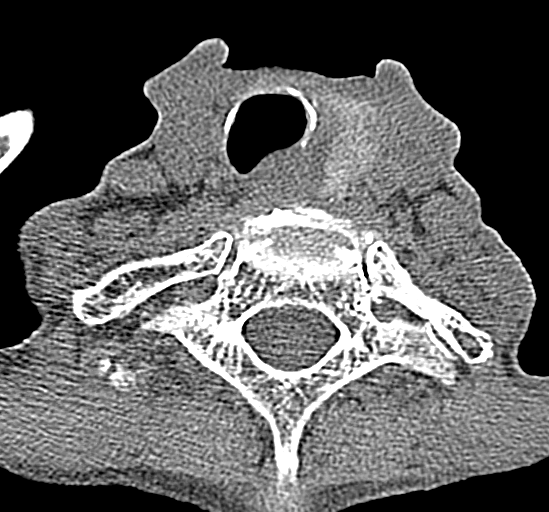
[im 12/84  bone]
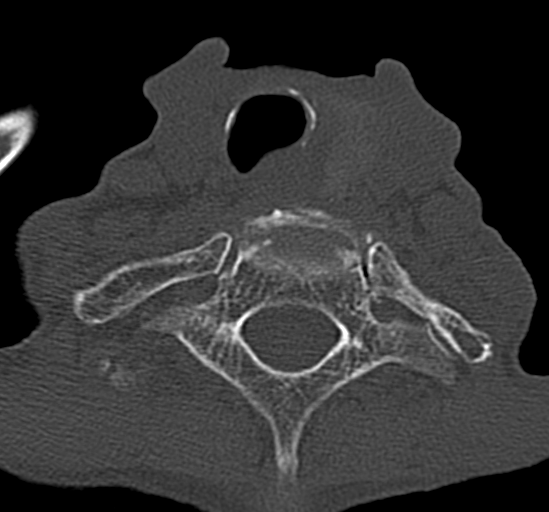
[im 36/84  bone]
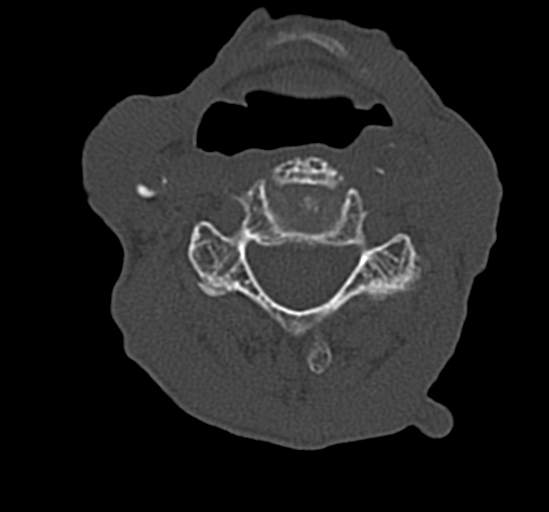
[im 48/84  bone]
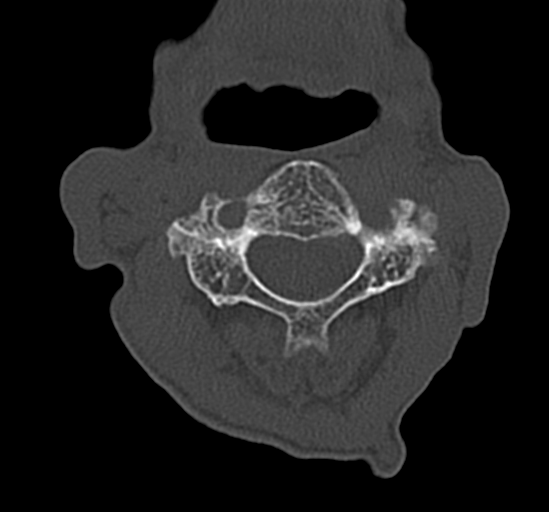
[im 72/84  bone]
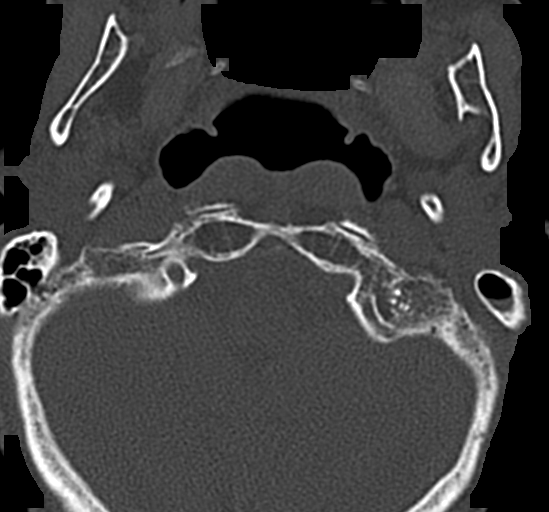

[12 of 33 positions shown; findings below may reference images not displayed]

FINDINGS: Alignment: Alignment is anatomic.

Skull base and vertebrae: No acute fracture. No primary bone lesion
or focal pathologic process.

Soft tissues and spinal canal: No prevertebral fluid or swelling. No
visible canal hematoma.

Disc levels: Postsurgical changes from previous C5-6 ACDF. Prominent
spondylosis at C6-7 with symmetrical neural foraminal encroachment.
Mild spondylosis at C2-3 and C3-4, with right predominant neural
foraminal encroachment at C2-3.

Upper chest: Airway is patent. Biapical pleural and parenchymal
scarring.

Other: Reconstructed images demonstrate no additional findings.
IMPRESSION: 1. No acute cervical spine fracture.
2. C5-6 ACDF.
3. Multilevel spondylosis greatest at C2-3 and C6-7.

## 2023-03-18 DIAGNOSIS — Z09 Encounter for follow-up examination after completed treatment for conditions other than malignant neoplasm: Secondary | ICD-10-CM | POA: Diagnosis not present

## 2023-03-18 DIAGNOSIS — Z8719 Personal history of other diseases of the digestive system: Secondary | ICD-10-CM | POA: Diagnosis not present

## 2023-03-18 DIAGNOSIS — Z87440 Personal history of urinary (tract) infections: Secondary | ICD-10-CM | POA: Diagnosis not present

## 2023-03-22 DIAGNOSIS — I509 Heart failure, unspecified: Secondary | ICD-10-CM | POA: Diagnosis not present

## 2023-03-22 DIAGNOSIS — Z681 Body mass index (BMI) 19 or less, adult: Secondary | ICD-10-CM | POA: Diagnosis not present

## 2023-03-22 DIAGNOSIS — D692 Other nonthrombocytopenic purpura: Secondary | ICD-10-CM | POA: Diagnosis not present

## 2023-03-22 DIAGNOSIS — Z515 Encounter for palliative care: Secondary | ICD-10-CM | POA: Diagnosis not present

## 2023-03-27 ENCOUNTER — Other Ambulatory Visit: Payer: Self-pay | Admitting: Internal Medicine

## 2023-03-28 NOTE — Telephone Encounter (Signed)
Name of Medication: MSIR Name of Pharmacy: [facility] or Written Date and Quantity: 12-31-22 #90 Last Office Visit and Type: 10-29-22 Next Office Visit and Type: 05-06-23 Last Controlled Substance Agreement Date: 09-29-20 Last UDS: 04-27-19

## 2023-04-22 ENCOUNTER — Other Ambulatory Visit: Payer: Self-pay | Admitting: Internal Medicine

## 2023-04-22 NOTE — Telephone Encounter (Signed)
Last filled 02-19-23 #60 Last OV 10/29/22 CPE Next OV 05/06/23 [facility]

## 2023-04-25 DIAGNOSIS — S52521A Torus fracture of lower end of right radius, initial encounter for closed fracture: Secondary | ICD-10-CM | POA: Diagnosis not present

## 2023-05-06 ENCOUNTER — Ambulatory Visit: Payer: PPO | Admitting: Internal Medicine

## 2023-05-07 ENCOUNTER — Encounter: Payer: Self-pay | Admitting: Internal Medicine

## 2023-06-10 ENCOUNTER — Other Ambulatory Visit: Payer: Self-pay | Admitting: Internal Medicine

## 2023-06-10 NOTE — Telephone Encounter (Signed)
Name of Medication: MSIR Name of Pharmacy: [facility] Written Date and Quantity: 03-28-23 #90 Last Office Visit and Type: 10-29-22 Next Office Visit and Type: 05-06-23 No Show Last Controlled Substance Agreement Date: 09-29-20 Last UDS: 04-27-19

## 2023-06-24 ENCOUNTER — Other Ambulatory Visit: Payer: Self-pay | Admitting: Internal Medicine

## 2023-06-24 NOTE — Telephone Encounter (Signed)
Last filled 04-22-23 #60 Last OV 10/29/22 CPE No Future OV [facility]

## 2023-07-04 ENCOUNTER — Inpatient Hospital Stay
Admission: EM | Admit: 2023-07-04 | Discharge: 2023-07-11 | DRG: 562 | Disposition: A | Discharge status: Skilled Nursing Facility | Payer: PPO | Attending: Student in an Organized Health Care Education/Training Program | Admitting: Student in an Organized Health Care Education/Training Program

## 2023-07-04 ENCOUNTER — Other Ambulatory Visit: Payer: Self-pay

## 2023-07-04 ENCOUNTER — Encounter: Payer: Self-pay | Admitting: Emergency Medicine

## 2023-07-04 ENCOUNTER — Emergency Department: Payer: PPO

## 2023-07-04 DIAGNOSIS — N200 Calculus of kidney: Secondary | ICD-10-CM | POA: Diagnosis present

## 2023-07-04 DIAGNOSIS — R300 Dysuria: Secondary | ICD-10-CM | POA: Diagnosis present

## 2023-07-04 DIAGNOSIS — E43 Unspecified severe protein-calorie malnutrition: Secondary | ICD-10-CM | POA: Diagnosis present

## 2023-07-04 DIAGNOSIS — S06330A Contusion and laceration of cerebrum, unspecified, without loss of consciousness, initial encounter: Secondary | ICD-10-CM | POA: Diagnosis not present

## 2023-07-04 DIAGNOSIS — Y92009 Unspecified place in unspecified non-institutional (private) residence as the place of occurrence of the external cause: Principal | ICD-10-CM

## 2023-07-04 DIAGNOSIS — Z66 Do not resuscitate: Secondary | ICD-10-CM | POA: Diagnosis present

## 2023-07-04 DIAGNOSIS — S199XXA Unspecified injury of neck, initial encounter: Secondary | ICD-10-CM | POA: Diagnosis not present

## 2023-07-04 DIAGNOSIS — M25711 Osteophyte, right shoulder: Secondary | ICD-10-CM | POA: Diagnosis not present

## 2023-07-04 DIAGNOSIS — W19XXXA Unspecified fall, initial encounter: Principal | ICD-10-CM

## 2023-07-04 DIAGNOSIS — G319 Degenerative disease of nervous system, unspecified: Secondary | ICD-10-CM | POA: Diagnosis not present

## 2023-07-04 DIAGNOSIS — M4854XA Collapsed vertebra, not elsewhere classified, thoracic region, initial encounter for fracture: Secondary | ICD-10-CM | POA: Diagnosis present

## 2023-07-04 DIAGNOSIS — Z85828 Personal history of other malignant neoplasm of skin: Secondary | ICD-10-CM

## 2023-07-04 DIAGNOSIS — M25511 Pain in right shoulder: Secondary | ICD-10-CM | POA: Diagnosis not present

## 2023-07-04 DIAGNOSIS — E785 Hyperlipidemia, unspecified: Secondary | ICD-10-CM | POA: Diagnosis present

## 2023-07-04 DIAGNOSIS — B964 Proteus (mirabilis) (morganii) as the cause of diseases classified elsewhere: Secondary | ICD-10-CM | POA: Diagnosis present

## 2023-07-04 DIAGNOSIS — K5900 Constipation, unspecified: Secondary | ICD-10-CM | POA: Diagnosis present

## 2023-07-04 DIAGNOSIS — Z820 Family history of epilepsy and other diseases of the nervous system: Secondary | ICD-10-CM

## 2023-07-04 DIAGNOSIS — Z90710 Acquired absence of both cervix and uterus: Secondary | ICD-10-CM

## 2023-07-04 DIAGNOSIS — Z1629 Resistance to other single specified antibiotic: Secondary | ICD-10-CM | POA: Diagnosis present

## 2023-07-04 DIAGNOSIS — R531 Weakness: Secondary | ICD-10-CM | POA: Diagnosis not present

## 2023-07-04 DIAGNOSIS — M19011 Primary osteoarthritis, right shoulder: Secondary | ICD-10-CM | POA: Diagnosis not present

## 2023-07-04 DIAGNOSIS — S52531A Colles' fracture of right radius, initial encounter for closed fracture: Secondary | ICD-10-CM | POA: Diagnosis present

## 2023-07-04 DIAGNOSIS — I700 Atherosclerosis of aorta: Secondary | ICD-10-CM | POA: Diagnosis not present

## 2023-07-04 DIAGNOSIS — S52501A Unspecified fracture of the lower end of right radius, initial encounter for closed fracture: Secondary | ICD-10-CM

## 2023-07-04 DIAGNOSIS — R609 Edema, unspecified: Secondary | ICD-10-CM | POA: Diagnosis not present

## 2023-07-04 DIAGNOSIS — M8580 Other specified disorders of bone density and structure, unspecified site: Secondary | ICD-10-CM | POA: Diagnosis not present

## 2023-07-04 DIAGNOSIS — Z8673 Personal history of transient ischemic attack (TIA), and cerebral infarction without residual deficits: Secondary | ICD-10-CM

## 2023-07-04 DIAGNOSIS — J449 Chronic obstructive pulmonary disease, unspecified: Secondary | ICD-10-CM | POA: Diagnosis present

## 2023-07-04 DIAGNOSIS — S42211A Unspecified displaced fracture of surgical neck of right humerus, initial encounter for closed fracture: Secondary | ICD-10-CM | POA: Diagnosis not present

## 2023-07-04 DIAGNOSIS — G8929 Other chronic pain: Secondary | ICD-10-CM | POA: Diagnosis present

## 2023-07-04 DIAGNOSIS — F32A Depression, unspecified: Secondary | ICD-10-CM | POA: Diagnosis present

## 2023-07-04 DIAGNOSIS — M84421A Pathological fracture, right humerus, initial encounter for fracture: Secondary | ICD-10-CM

## 2023-07-04 DIAGNOSIS — Z9882 Breast implant status: Secondary | ICD-10-CM

## 2023-07-04 DIAGNOSIS — B370 Candidal stomatitis: Secondary | ICD-10-CM | POA: Diagnosis present

## 2023-07-04 DIAGNOSIS — N390 Urinary tract infection, site not specified: Principal | ICD-10-CM | POA: Diagnosis present

## 2023-07-04 DIAGNOSIS — K219 Gastro-esophageal reflux disease without esophagitis: Secondary | ICD-10-CM | POA: Diagnosis present

## 2023-07-04 DIAGNOSIS — S42201A Unspecified fracture of upper end of right humerus, initial encounter for closed fracture: Secondary | ICD-10-CM | POA: Diagnosis not present

## 2023-07-04 DIAGNOSIS — M25531 Pain in right wrist: Secondary | ICD-10-CM | POA: Diagnosis not present

## 2023-07-04 DIAGNOSIS — J90 Pleural effusion, not elsewhere classified: Secondary | ICD-10-CM | POA: Diagnosis not present

## 2023-07-04 DIAGNOSIS — Z833 Family history of diabetes mellitus: Secondary | ICD-10-CM

## 2023-07-04 DIAGNOSIS — B0229 Other postherpetic nervous system involvement: Secondary | ICD-10-CM | POA: Diagnosis present

## 2023-07-04 DIAGNOSIS — Z853 Personal history of malignant neoplasm of breast: Secondary | ICD-10-CM

## 2023-07-04 DIAGNOSIS — Z79899 Other long term (current) drug therapy: Secondary | ICD-10-CM

## 2023-07-04 DIAGNOSIS — J439 Emphysema, unspecified: Secondary | ICD-10-CM | POA: Diagnosis not present

## 2023-07-04 DIAGNOSIS — I10 Essential (primary) hypertension: Secondary | ICD-10-CM | POA: Diagnosis present

## 2023-07-04 DIAGNOSIS — Z981 Arthrodesis status: Secondary | ICD-10-CM | POA: Diagnosis not present

## 2023-07-04 DIAGNOSIS — M1990 Unspecified osteoarthritis, unspecified site: Secondary | ICD-10-CM | POA: Diagnosis present

## 2023-07-04 DIAGNOSIS — D509 Iron deficiency anemia, unspecified: Secondary | ICD-10-CM | POA: Diagnosis present

## 2023-07-04 DIAGNOSIS — S42209A Unspecified fracture of upper end of unspecified humerus, initial encounter for closed fracture: Secondary | ICD-10-CM | POA: Diagnosis present

## 2023-07-04 DIAGNOSIS — R64 Cachexia: Secondary | ICD-10-CM | POA: Diagnosis present

## 2023-07-04 DIAGNOSIS — Z681 Body mass index (BMI) 19 or less, adult: Secondary | ICD-10-CM

## 2023-07-04 DIAGNOSIS — Z87891 Personal history of nicotine dependence: Secondary | ICD-10-CM

## 2023-07-04 DIAGNOSIS — F1120 Opioid dependence, uncomplicated: Secondary | ICD-10-CM | POA: Diagnosis present

## 2023-07-04 DIAGNOSIS — R54 Age-related physical debility: Secondary | ICD-10-CM | POA: Diagnosis present

## 2023-07-04 DIAGNOSIS — Z043 Encounter for examination and observation following other accident: Secondary | ICD-10-CM | POA: Diagnosis not present

## 2023-07-04 DIAGNOSIS — Z888 Allergy status to other drugs, medicaments and biological substances status: Secondary | ICD-10-CM

## 2023-07-04 LAB — CBC WITH DIFFERENTIAL/PLATELET
Abs Immature Granulocytes: 0.06 10*3/uL (ref 0.00–0.07)
Basophils Absolute: 0 10*3/uL (ref 0.0–0.1)
Basophils Relative: 0 %
Eosinophils Absolute: 0 10*3/uL (ref 0.0–0.5)
Eosinophils Relative: 0 %
HCT: 27.9 % — ABNORMAL LOW (ref 36.0–46.0)
Hemoglobin: 8.5 g/dL — ABNORMAL LOW (ref 12.0–15.0)
Immature Granulocytes: 1 %
Lymphocytes Relative: 19 %
Lymphs Abs: 2 10*3/uL (ref 0.7–4.0)
MCH: 22.6 pg — ABNORMAL LOW (ref 26.0–34.0)
MCHC: 30.5 g/dL (ref 30.0–36.0)
MCV: 74.2 fL — ABNORMAL LOW (ref 80.0–100.0)
Monocytes Absolute: 0.7 10*3/uL (ref 0.1–1.0)
Monocytes Relative: 7 %
Neutro Abs: 7.3 10*3/uL (ref 1.7–7.7)
Neutrophils Relative %: 73 %
Platelets: 209 10*3/uL (ref 150–400)
RBC: 3.76 MIL/uL — ABNORMAL LOW (ref 3.87–5.11)
RDW: 17.7 % — ABNORMAL HIGH (ref 11.5–15.5)
WBC: 10.1 10*3/uL (ref 4.0–10.5)
nRBC: 0 % (ref 0.0–0.2)

## 2023-07-04 LAB — BASIC METABOLIC PANEL
Anion gap: 9 (ref 5–15)
BUN: 18 mg/dL (ref 8–23)
CO2: 21 mmol/L — ABNORMAL LOW (ref 22–32)
Calcium: 8.1 mg/dL — ABNORMAL LOW (ref 8.9–10.3)
Chloride: 108 mmol/L (ref 98–111)
Creatinine, Ser: 0.62 mg/dL (ref 0.44–1.00)
GFR, Estimated: >60 mL/min (ref >60)
Glucose, Bld: 114 mg/dL — ABNORMAL HIGH (ref 70–99)
Potassium: 3.3 mmol/L — ABNORMAL LOW (ref 3.5–5.1)
Sodium: 138 mmol/L (ref 135–145)

## 2023-07-04 MED ORDER — ACETAMINOPHEN 325 MG PO TABS
650.0000 mg | ORAL_TABLET | Freq: Four times a day (QID) | ORAL | Status: DC | PRN
Start: 2023-07-04 — End: 2023-07-06
  Administered 2023-07-05 – 2023-07-06 (×5): 650 mg via ORAL
  Filled 2023-07-04 (×4): qty 2

## 2023-07-04 MED ORDER — DOCUSATE SODIUM 100 MG PO CAPS
100.0000 mg | ORAL_CAPSULE | Freq: Two times a day (BID) | ORAL | Status: DC
  Administered 2023-07-04 – 2023-07-10 (×12): 100 mg via ORAL
  Filled 2023-07-04 (×12): qty 1

## 2023-07-04 MED ORDER — PANTOPRAZOLE SODIUM 40 MG PO TBEC
40.0000 mg | DELAYED_RELEASE_TABLET | Freq: Every day | ORAL | Status: DC
  Administered 2023-07-05 – 2023-07-11 (×7): 40 mg via ORAL
  Filled 2023-07-04 (×7): qty 1

## 2023-07-04 MED ORDER — HYDRALAZINE HCL 20 MG/ML IJ SOLN
5.0000 mg | INTRAMUSCULAR | Status: DC | PRN
Start: 2023-07-04 — End: 2023-07-11

## 2023-07-04 MED ORDER — AMLODIPINE BESYLATE 5 MG PO TABS
5.0000 mg | ORAL_TABLET | Freq: Every day | ORAL | Status: DC
  Administered 2023-07-05 – 2023-07-11 (×7): 5 mg via ORAL
  Filled 2023-07-04 (×7): qty 1

## 2023-07-04 MED ORDER — FLUTICASONE PROPIONATE 50 MCG/ACT NA SUSP
2.0000 | Freq: Every day | NASAL | Status: DC
  Administered 2023-07-05 – 2023-07-11 (×7): 2 via NASAL
  Filled 2023-07-04: qty 16

## 2023-07-04 MED ORDER — MORPHINE SULFATE 15 MG PO TABS
15.0000 mg | ORAL_TABLET | Freq: Three times a day (TID) | ORAL | Status: DC | PRN
  Administered 2023-07-04 – 2023-07-10 (×14): 15 mg via ORAL
  Filled 2023-07-04 (×16): qty 1

## 2023-07-04 MED ORDER — LORATADINE 10 MG PO TABS
10.0000 mg | ORAL_TABLET | Freq: Every day | ORAL | Status: DC
  Administered 2023-07-05 – 2023-07-11 (×7): 10 mg via ORAL
  Filled 2023-07-04 (×7): qty 1

## 2023-07-04 MED ORDER — IOHEXOL 350 MG/ML SOLN
75.0000 mL | Freq: Once | INTRAVENOUS | Status: AC | PRN
  Administered 2023-07-04: 75 mL via INTRAVENOUS

## 2023-07-04 MED ORDER — MECLIZINE HCL 25 MG PO TABS
25.0000 mg | ORAL_TABLET | Freq: Three times a day (TID) | ORAL | Status: DC | PRN
  Filled 2023-07-04: qty 1

## 2023-07-04 MED ORDER — MORPHINE SULFATE (PF) 2 MG/ML IV SOLN
2.0000 mg | Freq: Once | INTRAVENOUS | Status: AC
  Administered 2023-07-04: 2 mg via INTRAVENOUS
  Filled 2023-07-04: qty 1

## 2023-07-04 MED ORDER — TOPIRAMATE 25 MG PO TABS
50.0000 mg | ORAL_TABLET | Freq: Every day | ORAL | Status: DC
  Administered 2023-07-04 – 2023-07-10 (×7): 50 mg via ORAL
  Filled 2023-07-04 (×7): qty 2

## 2023-07-04 MED ORDER — POLYETHYLENE GLYCOL 3350 17 G PO PACK
17.0000 g | PACK | Freq: Every day | ORAL | Status: DC | PRN
  Administered 2023-07-07: 17 g via ORAL
  Filled 2023-07-04 (×2): qty 1

## 2023-07-04 MED ORDER — ENOXAPARIN SODIUM 40 MG/0.4ML IJ SOSY
40.0000 mg | PREFILLED_SYRINGE | INTRAMUSCULAR | Status: DC
Start: 2023-07-04 — End: 2023-07-04

## 2023-07-04 MED ORDER — CYCLOSPORINE 0.05 % OP EMUL
1.0000 [drp] | Freq: Two times a day (BID) | OPHTHALMIC | Status: DC
  Administered 2023-07-04 – 2023-07-10 (×11): 1 [drp] via OPHTHALMIC
  Filled 2023-07-04 (×14): qty 30

## 2023-07-04 MED ORDER — POTASSIUM CHLORIDE CRYS ER 20 MEQ PO TBCR
40.0000 meq | EXTENDED_RELEASE_TABLET | Freq: Once | ORAL | Status: AC
  Administered 2023-07-04: 40 meq via ORAL
  Filled 2023-07-04: qty 2

## 2023-07-04 MED ORDER — ACETAMINOPHEN 650 MG RE SUPP: 650.0000 mg | Freq: Four times a day (QID) | RECTAL | Status: DC | PRN

## 2023-07-04 MED ORDER — ENOXAPARIN SODIUM 30 MG/0.3ML IJ SOSY
30.0000 mg | PREFILLED_SYRINGE | INTRAMUSCULAR | Status: DC
  Administered 2023-07-04 – 2023-07-10 (×7): 30 mg via SUBCUTANEOUS
  Filled 2023-07-04 (×7): qty 0.3

## 2023-07-04 MED ORDER — LORAZEPAM 0.5 MG PO TABS
0.5000 mg | ORAL_TABLET | Freq: Four times a day (QID) | ORAL | Status: DC | PRN
  Administered 2023-07-05 – 2023-07-10 (×9): 0.5 mg via ORAL
  Filled 2023-07-04 (×9): qty 1

## 2023-07-04 MED ORDER — POLYETHYLENE GLYCOL 3350 17 G PO PACK
17.0000 g | PACK | Freq: Every day | ORAL | Status: DC
  Administered 2023-07-05 – 2023-07-10 (×6): 17 g via ORAL
  Filled 2023-07-04 (×7): qty 1

## 2023-07-04 MED ORDER — AZELASTINE HCL 0.1 % NA SOLN
2.0000 | Freq: Two times a day (BID) | NASAL | Status: DC
  Administered 2023-07-05 – 2023-07-11 (×13): 2 via NASAL
  Filled 2023-07-04 (×2): qty 30

## 2023-07-04 MED ORDER — BISACODYL 5 MG PO TBEC
5.0000 mg | DELAYED_RELEASE_TABLET | Freq: Every day | ORAL | Status: DC | PRN
  Administered 2023-07-05 – 2023-07-07 (×2): 5 mg via ORAL
  Filled 2023-07-04 (×2): qty 1

## 2023-07-04 MED ORDER — ONDANSETRON HCL 4 MG/2ML IJ SOLN: 4.0000 mg | Freq: Four times a day (QID) | INTRAMUSCULAR | Status: DC | PRN

## 2023-07-04 MED ORDER — ONDANSETRON HCL 4 MG PO TABS
4.0000 mg | ORAL_TABLET | Freq: Four times a day (QID) | ORAL | Status: DC | PRN
  Administered 2023-07-05 (×2): 4 mg via ORAL
  Filled 2023-07-04: qty 1

## 2023-07-04 MED ORDER — ALBUTEROL SULFATE (2.5 MG/3ML) 0.083% IN NEBU: 2.5000 mg | INHALATION_SOLUTION | RESPIRATORY_TRACT | Status: DC | PRN

## 2023-07-04 MED ORDER — ACYCLOVIR 200 MG PO CAPS
200.0000 mg | ORAL_CAPSULE | Freq: Two times a day (BID) | ORAL | Status: DC
  Administered 2023-07-04 – 2023-07-11 (×14): 200 mg via ORAL
  Filled 2023-07-04 (×14): qty 1

## 2023-07-04 NOTE — ED Provider Notes (Signed)
[facility] Provider Note    Event Date/Time   First MD Initiated Contact with Patient 07/04/23 1114     (approximate)   History   Fall   HPI  [name] is a 89 y.o. female who was brought today by EMS, after unwitnessed fall.  Patient complains of right shoulder pain, right wrist pain.  Patient denies loss of consciousness, or head trauma.  Patient is here without a family.      Physical Exam   Triage Vital Signs: ED Triage Vitals [07/04/23 1059]  Encounter Vitals Group     BP (!) 159/62     Systolic BP Percentile      Diastolic BP Percentile      Pulse Rate 83     Resp 18     Temp 97.7 F (36.5 C)     Temp Source Oral     SpO2 99 %     Weight 85 lb (38.6 kg)     Height      Head Circumference      Peak Flow      Pain Score      Pain Loc      Pain Education      Exclude from Growth Chart     Most recent vital signs: Vitals:   07/04/23 1059  BP: (!) 159/62  Pulse: 83  Resp: 18  Temp: 97.7 F (36.5 C)  SpO2: 99%     Constitutional: Alert, moderate distress, underweight Eyes: Conjunctivae are normal.  Head: Skin is intact, no tenderness to palpation atraumatic.  No ecchymosis no hematoma Nose: No congestion/rhinnorhea. Mouth/Throat: Mucous membranes are moist.   Neck: C-collar in place. Cardiovascular:   Good peripheral circulation. Respiratory: Normal respiratory effort.  No retractions.  Gastrointestinal: Soft and nontender.  Musculoskeletal: Right shoulder: Deformity, ROM limited by pain.  Right wrist: Deformity, dorsal deviation of the radius.  Anterior hematoma ROM limited by pain.  Sensation is intact, strength 3/5.  Pulses present Neurologic:  MAE spontaneously. No gross focal neurologic deficits are appreciated.  Skin:  Skin is warm, dry and intact. No rash noted. Psychiatric: Mood and affect are normal. Speech and behavior are normal.    ED Results / Procedures / Treatments   Labs (all labs ordered are  listed, but only abnormal results are displayed) Labs Reviewed - No data to display   EKG See physician read    RADIOLOGY I independently reviewed and interpreted imaging and agree with radiologists findings.      PROCEDURES:  Critical Care performed:   Procedures   MEDICATIONS ORDERED IN ED: Medications  morphine (PF) 2 MG/ML injection 2 mg (has no administration in time range)     IMPRESSION / MDM / ASSESSMENT AND PLAN / ED COURSE  I reviewed the triage vital signs and the nursing notes.  Differential diagnosis includes, but is not limited to, right humerus fracture, shoulder dislocation, Colles' fracture, cervical fracture  Patient's presentation is most consistent with acute complicated illness / injury requiring diagnostic workup.  Patient's diagnosis is consistent with right proximal humeral fracture, T1 compression fracture,old right Colles' fracture. I independently reviewed and interpreted imaging and agree with radiologists findings. Labs are rea reassuring. I did review the patient's allergies and medications. Patient will be admitted.  Orthopedics will see her tomorrow Discussed plan of care with patient, answered all of patient's questions, Patient agreeable to plan of care. Advised patient to take medications according to the instructions on the label.  Discussed possible side effects of new medications. Patient verbalized understanding. Clinical Course as of 07/04/23 1735  Thu Jul 04, 2023  1322 DG Chest 1 View [AE]  1322 Small right-sided pleural effusion [AE]  1322 Right Colles' fracture [AE]  1322 Right proximal humeral fracture [AE]  1326 CT Head Wo Contrast [AE]  1326 CT Head Wo Contrast . Right frontoparietal and temporal scalp hematoma.  [AE]  1327 CT Head Wo Contrast  No evidence of an acute intracranial abnormality. [AE]  1327 . T1 superior endplate vertebral compression fracture  [AE]  1729 CT CHEST ABDOMEN PELVIS W CONTRAST  Acute comminuted  and displaced impacted right humeral head and neck fracture. 2. No acute intrathoracic, intra-abdominal, intrapelvic traumatic injury. 3. Interval development of vague superior endplate concavity of the T1 and T3 levels. C   [AE]  1729 CBC with Differential(!) Anemia [AE]    Clinical Course User Index [AE] Gladys Damme, PA-C     FINAL CLINICAL IMPRESSION(S) / ED DIAGNOSES   Final diagnoses:  None     Rx / DC Orders   ED Discharge Orders     None        Note:  This document was prepared using Dragon voice recognition software and may include unintentional dictation errors.   Gladys Damme, PA-C 07/04/23 Adair Patter, MD 07/04/23 2351

## 2023-07-04 NOTE — ED Provider Notes (Addendum)
-----------------------------------------   3:03 PM on 07/04/2023 -----------------------------------------  Independently evaluated patient.  89 year old female presenting today for unwitnessed fall.  Complaining of pain along the right sided upper extremity.  Unknown head injury.  Patient at her normal mental status baseline with no confusion noted.  Patient at baseline lives alone and ambulates with the help of a walker.  Vital signs otherwise stable.  Tenderness palpation over C and T-spine.  Initial x-ray imaging with PA shows right humerus fracture as well as right distal Colles' fracture.  CT imaging of head with no acute pathology.  CT C-spine shows concern for age-indeterminate T1 fracture.  Given these findings, recommended getting follow-up CT chest/abdomen/pelvis for further acute traumatic pathology.  I suspect patient will likely have to be admitted following this given that she uses a walker at baseline and now will not be able to use her right upper extremity given the humerus fracture.  That will need to be placed in a sling in the right wrist fracture will need to be splinted which has been discussed with the PA.  Family in the room is agreeable with this plan as patient does not have a safe disposition home at this time.  Discussed case with oncoming ED provider as well while final imaging pending.   Janith Lima, MD 07/04/23 [phone]

## 2023-07-04 NOTE — H&P (Signed)
History and Physical    Patient: [name] ZOX:[id number] DOB: [birth year] DOA: 07/04/2023 DOS: the patient was seen and examined on 07/04/2023 PCP: Karie Schwalbe, MD  Patient coming from: Home - lives alone; NOK: [place], [place], [phone]   Chief Complaint: fall  HPI: CHAWN RACKLIFF is a 89 y.o. female with medical history significant of breast cancer, CVA, depression, HTN, and HLD who presented on 12/26 with a fall.  She reports that she lives alone and uses a walker to get around.  She went to the bathroom and isn't sure what happened but she fell and hurt her arm.  No other injuries.    ER Course:  Fall at home.  R humerus fracture and Colles fracture.  T1 compression fracture.  Uses walker at baseline.  Orthopedics consulted, sling in place and will see tomorrow.     Review of Systems: As mentioned in the history of present illness. All other systems reviewed and are negative. Past Medical History:  Diagnosis Date   Arrhythmia    Arthritis    Atypical chest pain    Lexiscan myoview (5/11) with EF 84%, normal wall motion, small fixed apical  perfusion defect likely due to breast attenuation, no evidence for ischemia or infarction.  **Patient had an   Basal cell carcinoma 07/28/19 [facility]   R pretibial   Breast cancer (HCC)    Bilateral mastectomies 1986.   CVA (cerebral infarction) 10/12   right lacunar   Depression    Dyspnea    Echo (5/11) was a difficult study due to breast implants but showed normal LV and RV size and systolic function.       GERD (gastroesophageal reflux disease)    Headache    s/p shingles - right side of head   History of partial thyroidectomy    Hyperlipidemia    Hypertension    in past - no current meds/issues   Obstruction of intestine (HCC)    Partial small bowel obstruction (HCC) 7/14   no surgery   Superficial basal cell carcinoma 06/18/2019   R mid to distal pretibial   Wears dentures    full upper   Zoster     with  Post-herpetic neuralgia   Past Surgical History:  Procedure Laterality Date   ABDOMINAL HYSTERECTOMY     BREAST SURGERY Bilateral    cancer - mastectomy and implant insertion   INTRAMEDULLARY (IM) NAIL INTERTROCHANTERIC Left 06/19/2020   Procedure: INTRAMEDULLARY (IM) NAIL INTERTROCHANTRIC;  Surgeon: Deeann Saint, MD;  Location: [facility] ORS;  Service: Orthopedics;  Laterality: Left;   IR THORACENTESIS ASP PLEURAL SPACE W/IMG GUIDE  10/02/2021   MASTECTOMY  1980   bilateral   NECK SURGERY     TARSORRHAPHY Bilateral 08/16/2015   Procedure: MINOR TARSORRAPHY LATERAL PLACEMENT;  Surgeon: Imagene Riches, MD;  Location: [facility];  Service: Ophthalmology;  Laterality: Bilateral;   VAGINAL DELIVERY     Social History:  reports that she has quit smoking. She has never been exposed to tobacco smoke. She has never used smokeless tobacco. She reports that she does not drink alcohol and does not use drugs.  Allergies  Allergen Reactions   Duloxetine     REACTION: N/V Mental status change and trouble with balance   Cymbalta [Duloxetine Hcl]     Unsure of reaction   Maxitrol [Neomycin-Polymyxin-Dexameth]     Unsure of reaction   Pregabalin     REACTION: SOB, swollen lips   Tobradex [Tobramycin-Dexamethasone]  Unsure of reaction    Family History  Problem Relation Age of Onset   Heart disease Son        open heart surgery for a blockage   Heart Problems Son    Diabetes Son    Parkinson's disease Son     Prior to Admission medications   Medication Sig Start Date End Date Taking? Authorizing Provider  acyclovir (ZOVIRAX) 200 MG capsule TAKE ONE CAPSULE BY MOUTH TWO TIMES DAILY 10/29/22   Tillman Abide I, MD  amLODipine (NORVASC) 5 MG tablet Take 1 tablet (5 mg total) by mouth daily. 10/29/22   Karie Schwalbe, MD  antiseptic oral rinse (BIOTENE) LIQD 15 mLs by Mouth Rinse route as needed for dry mouth.    [provider]  azelastine (ASTELIN) 0.1 % nasal spray Place 2  sprays into both nostrils 2 (two) times daily. 05/20/20   [provider]  feeding supplement (ENSURE ENLIVE / ENSURE PLUS) LIQD Take 237 mLs by mouth 3 (three) times daily between meals. 10/12/21   Arnetha Courser, MD  fexofenadine (ALLEGRA) 180 MG tablet Take 180 mg by mouth daily.    [provider]  fluticasone (FLONASE) 50 MCG/ACT nasal spray PLACE TWO SPRAYS INTO BOTH NOSTRILS DAILY AS NEEDED 12/24/22   Tillman Abide I, MD  LORazepam (ATIVAN) 1 MG tablet TAKE ONE HALF (1/2) TO ONE TABLET BY MOUTH TWICE DAILY AS NEEDED FOR ANXIETY 06/24/23   Karie Schwalbe, MD  morphine (MSIR) 15 MG tablet TAKE ONE TABLET BY MOUTH THREE TIMES DAILY AS NEEDED FOR PAIN 06/10/23   Karie Schwalbe, MD  Multiple Vitamin (MULTIVITAMIN) tablet Take 1 tablet by mouth once daily    [provider]  omeprazole (PRILOSEC) 20 MG capsule TAKE ONE CAPSULE BY MOUTH DAILY 10/29/22   Tillman Abide I, MD  polyethylene glycol (MIRALAX / GLYCOLAX) packet Take 17 g by mouth at bedtime.    [provider]  RESTASIS 0.05 % ophthalmic emulsion Place 1 drop into both eyes 2 (two) times daily. 02/24/18   [provider]  topiramate (TOPAMAX) 50 MG tablet TAKE ONE TABLET BY MOUTH AT BEDTIME FOR COMPLICATION OF SHINGLES 10/29/22   Karie Schwalbe, MD    Physical Exam: Vitals:   07/04/23 1059 07/04/23 1158 07/04/23 1827  BP: (!) 159/62  (!) 146/64  Pulse: 83  74  Resp: 18  16  Temp: 97.7 F (36.5 C)  98.5 F (36.9 C)  TempSrc: Oral  Oral  SpO2: 99%  100%  Weight: 38.6 kg    Height:  5\' 3"  (1.6 m)    General:  Appears calm and comfortable and is in NAD, frail, cachectic Eyes:  normal lids, R eye with chronic blindness/closed throughout ENT:  grossly normal hearing, lips & tongue, mmm; poor/absent dentition Neck:  no LAD, masses or thyromegaly Cardiovascular:  RRR, no m/r/g. No LE edema.  Respiratory:   CTA bilaterally with no wheezes/rales/rhonchi.  Normal respiratory  effort. Abdomen:  soft, NT, ND Skin:  bruising of primarily R upper arm Musculoskeletal:  limited movement of RUE due to pain Psychiatric:  blunted mood and affect, speech fluent and appropriate, AOx3 Neurologic:  CN 2-12 grossly intact, moves all extremities in coordinated fashion other than RUE   Radiological Exams on Admission: Independently reviewed - see discussion in A/P where applicable  CT CHEST ABDOMEN PELVIS W CONTRAST Result Date: 07/04/2023 CLINICAL DATA:  Polytrauma, blunt.  Fall EXAM: CT CHEST, ABDOMEN, AND PELVIS WITH CONTRAST TECHNIQUE: Multidetector  CT imaging of the chest, abdomen and pelvis was performed following the standard protocol during bolus administration of intravenous contrast. RADIATION DOSE REDUCTION: This exam was performed according to the departmental dose-optimization program which includes automated exposure control, adjustment of the mA and/or kV according to patient size and/or use of iterative reconstruction technique. CONTRAST:  75mL OMNIPAQUE IOHEXOL 350 MG/ML SOLN COMPARISON:  CT chest 10/01/2021. FINDINGS: CHEST: Cardiovascular: No aortic injury. The thoracic aorta is normal in caliber. Moderate to severe atherosclerotic plaque. Mitral annular calcification. Coronary artery calcification. The heart is normal in size. No significant pericardial effusion. The main pulmonary artery is normal in caliber. No central or proximal segmental pulmonary embolus. Limited evaluation more distally due to timing of contrast and motion artifact. Mediastinum/Nodes: No pneumomediastinum. No mediastinal hematoma. The esophagus is unremarkable. Redemonstration of a heterogeneous left thyroid gland-no further follow-up indicated. Right thyroid gland not visualized-likely surgically removed. The central airways are patent. No mediastinal, hilar, or axillary lymphadenopathy. Lungs/Pleura: Biapical pleural/pulmonary scarring. Mild interlobular septal wall thickening. Mild  centrilobular emphysematous changes. Mild paraseptal emphysematous changes. No focal consolidation. No pulmonary nodule. No pulmonary mass. No pulmonary contusion or laceration. No pneumatocele formation. Moderate volume right pleural effusion. Trace volume left pleural effusion. Bilateral lower lobe passive atelectasis. No pneumothorax. No hemothorax. Musculoskeletal/Chest wall: No chest wall mass.  Bilateral breast implants. Acute comminuted and displaced impacted right humeral head and neck fracture. Likely right calcific tendinosis. Old healed left humeral proximal fracture. Old healed right distal radial and ulnar fractures. No acute rib or sternal fracture. No spinal fracture. Interval development of vague superior endplate concavity of the T1 and T3 levels. Cervical spine surgical hardware. ABDOMEN / PELVIS: Hepatobiliary: Not enlarged. No focal lesion. No laceration or subcapsular hematoma. The gallbladder is otherwise unremarkable with no radio-opaque gallstones. No biliary ductal dilatation. Pancreas: Normal pancreatic contour. No main pancreatic duct dilatation. Spleen: Not enlarged. No focal lesion. No laceration, subcapsular hematoma, or vascular injury. Adrenals/Urinary Tract: No nodularity bilaterally. Bilateral kidneys enhance symmetrically. Bilateral staghorn calculi with largest on the left measuring 3 x 1.7 cm and 2.2 x 1.3 cm on the right. Interval increase in size of a 1.7 x 1.1 cm (from 1.1 x 0.9 cm) right hypodense lesion that demonstrates a density of 33 Hounsfield units. No hydronephrosis. No contusion, laceration, or subcapsular hematoma. No injury to the vascular structures or collecting systems. No hydroureter. The urinary bladder is unremarkable. Stomach/Bowel: No small or large bowel wall thickening or dilatation. Stool throughout the colon. The appendix is unremarkable. Vasculature/Lymphatics: Severe atherosclerotic plaque. No abdominal aorta or iliac aneurysm. No active contrast  extravasation or pseudoaneurysm. No abdominal, pelvic, inguinal lymphadenopathy. Reproductive: Normal. Other: No simple free fluid ascites. No pneumoperitoneum. No hemoperitoneum. No mesenteric hematoma identified. No organized fluid collection. Musculoskeletal: No significant soft tissue hematoma. No acute pelvic fracture. No spinal fracture. Stable chronic L1 compression fracture. Diffusely decreased bone density. Partially visualized left femur intramedullary nail fixation. Ports and Devices: None. IMPRESSION: 1. Acute comminuted and displaced impacted right humeral head and neck fracture. 2. No acute intrathoracic, intra-abdominal, intrapelvic traumatic injury. 3. Interval development of vague superior endplate concavity of the T1 and T3 levels. Correlate with point tenderness to palpation to evaluate for an acute component. 4. Otherwise no definite acute fracture or traumatic malalignment of the thoracic or lumbar spine. 5. Mild pulmonary edema with moderate right and trace left pleural effusions. 6. Bilateral staghorn calculi with largest on the left measuring 3 x 1.7 cm and 2.2 x 1.3 cm  on the right. 7. Indeterminate 1.7 x 1.1 cm right hypodense lesion. 8. Aortic Atherosclerosis (ICD10-I70.0) and Emphysema (ICD10-J43.9). 9. Diffusely decreased bone density. Electronically Signed   By: Tish Frederickson M.D.   On: 07/04/2023 17:19   DG Chest 1 View Result Date: 07/04/2023 CLINICAL DATA:  Fall. EXAM: CHEST  1 VIEW COMPARISON:  10/11/2021. FINDINGS: Hyperinflated lungs consistent with COPD. Small right-sided pleural effusion. Unremarkable cardiac silhouette. No pneumothorax. No focal consolidation. Normal pulmonary vasculature. Calcified aorta. Osseous structures are grossly intact. IMPRESSION: COPD. Small right-sided pleural effusion. Electronically Signed   By: Layla Maw M.D.   On: 07/04/2023 12:31   DG Wrist Complete Right Result Date: 07/04/2023 CLINICAL DATA:  Fall. EXAM: RIGHT WRIST - COMPLETE  3 VIEW COMPARISON:  None Available. FINDINGS: Osteopenia. Transverse fracture with dorsal angulation distal radius. Soft tissue swelling at the fracture site. Osseous structures are otherwise intact. IMPRESSION: Distal radius Colles fracture. Electronically Signed   By: Layla Maw M.D.   On: 07/04/2023 12:29   DG Shoulder Right Result Date: 07/04/2023 CLINICAL DATA:  Fall. EXAM: RIGHT SHOULDER - 3 VIEW COMPARISON:  None Available. FINDINGS: Osseous structures are osteopenic. Acromioclavicular degenerative changes with osteophytes. Fracture of the proximal humerus surgical neck with a few mm displacement. There is a associated soft tissue swelling. IMPRESSION: Proximal humerus fracture. Electronically Signed   By: Layla Maw M.D.   On: 07/04/2023 12:29   CT Head Wo Contrast Result Date: 07/04/2023 CLINICAL DATA:  Provided history: Neck trauma. Head trauma, minor. Additional history provided: Fall. Right shoulder swelling. EXAM: CT HEAD WITHOUT CONTRAST CT CERVICAL SPINE WITHOUT CONTRAST TECHNIQUE: Multidetector CT imaging of the head and cervical spine was performed following the standard protocol without intravenous contrast. Multiplanar CT image reconstructions of the cervical spine were also generated. RADIATION DOSE REDUCTION: This exam was performed according to the departmental dose-optimization program which includes automated exposure control, adjustment of the mA and/or kV according to patient size and/or use of iterative reconstruction technique. COMPARISON:  Head CT 10/01/2021.  Cervical spine CT 10/18/2020. FINDINGS: CT HEAD FINDINGS Brain: Generalized cerebral atrophy. Chronic cortical/subcortical infarct again noted within the right frontal lobe/insula, right temporal lobe and right parietal lobe (MCA vascular territory). Background mild patchy and ill-defined hypoattenuation within the cerebral white matter, nonspecific but compatible with chronic small vessel disease. There is no  acute intracranial hemorrhage. No acute demarcated cortical infarct. No extra-axial fluid collection. No evidence of an intracranial mass. No midline shift. Vascular: No hyperdense vessel.  Atherosclerotic calcifications. Skull: No calvarial fracture or aggressive osseous lesion. Sinuses/Orbits: No mass or acute finding within the imaged orbits. Left scleral buckle. Prior left ocular lens replacement. Mild mucosal thickening within the bilateral maxillary sinuses at the imaged levels. Moderate mucosal thickening within the right sphenoid sinus. Minimal mucosal thickening scattered within bilateral ethmoid air cells. Other: Right frontoparietal and temporal scalp hematoma. CT CERVICAL SPINE FINDINGS Alignment: No significant spondylolisthesis. Skull base and vertebrae: The basion-dental and atlanto-dental intervals are maintained.No evidence of acute fracture to the cervical spine. Prior C5-C6 ACDF. Solid vertebral body ankylosis at this level. T1 superior endplate vertebral compression deformity (with 30-40% vertebral body height loss), new from the prior cervical spine CT of 10/18/2020 but otherwise age-indeterminate. Soft tissues and spinal canal: No prevertebral fluid or swelling. No visible canal hematoma. Prior right hemithyroidectomy. Disc levels: Cervical spondylosis with multilevel disc space narrowing, disc bulges/central disc protrusions and uncovertebral hypertrophy. Posterior disc osteophyte complex at C6-C7. Disc space narrowing is greatest at C6-C7 level (  advanced at this level). No appreciable high-grade spinal canal stenosis. Multilevel bony neural foraminal narrowing. Degenerative changes also present at the C1-C2 articulation. Upper chest: The right lung apex is excluded from the field of view. The left lung apex is only minimally included in the field of view. No visible pneumothorax. IMPRESSION: CT head: 1. No evidence of an acute intracranial abnormality. 2. Right frontoparietal and temporal  scalp hematoma. 3. Known chronic right MCA territory cortical/subcortical infarct. 4. Background mild cerebral white matter chronic small vessel ischemic disease. 5. Generalized cerebral atrophy. 6. Mild paranasal sinus disease at the imaged levels as described. CT cervical spine: 1. T1 superior endplate vertebral compression fracture (30-40% vertebral body height loss), new from the prior cervical spine CT of 10/18/2020 but otherwise age-indeterminate. 2. No evidence of an acute cervical spine fracture. 3. Prior C5-C6 ACDF. No evidence of acute hardware compromise. 4. Cervical spondylosis as described. Electronically Signed   By: Jackey Loge D.O.   On: 07/04/2023 12:28   CT Cervical Spine Wo Contrast Result Date: 07/04/2023 CLINICAL DATA:  Provided history: Neck trauma. Head trauma, minor. Additional history provided: Fall. Right shoulder swelling. EXAM: CT HEAD WITHOUT CONTRAST CT CERVICAL SPINE WITHOUT CONTRAST TECHNIQUE: Multidetector CT imaging of the head and cervical spine was performed following the standard protocol without intravenous contrast. Multiplanar CT image reconstructions of the cervical spine were also generated. RADIATION DOSE REDUCTION: This exam was performed according to the departmental dose-optimization program which includes automated exposure control, adjustment of the mA and/or kV according to patient size and/or use of iterative reconstruction technique. COMPARISON:  Head CT 10/01/2021.  Cervical spine CT 10/18/2020. FINDINGS: CT HEAD FINDINGS Brain: Generalized cerebral atrophy. Chronic cortical/subcortical infarct again noted within the right frontal lobe/insula, right temporal lobe and right parietal lobe (MCA vascular territory). Background mild patchy and ill-defined hypoattenuation within the cerebral white matter, nonspecific but compatible with chronic small vessel disease. There is no acute intracranial hemorrhage. No acute demarcated cortical infarct. No extra-axial fluid  collection. No evidence of an intracranial mass. No midline shift. Vascular: No hyperdense vessel.  Atherosclerotic calcifications. Skull: No calvarial fracture or aggressive osseous lesion. Sinuses/Orbits: No mass or acute finding within the imaged orbits. Left scleral buckle. Prior left ocular lens replacement. Mild mucosal thickening within the bilateral maxillary sinuses at the imaged levels. Moderate mucosal thickening within the right sphenoid sinus. Minimal mucosal thickening scattered within bilateral ethmoid air cells. Other: Right frontoparietal and temporal scalp hematoma. CT CERVICAL SPINE FINDINGS Alignment: No significant spondylolisthesis. Skull base and vertebrae: The basion-dental and atlanto-dental intervals are maintained.No evidence of acute fracture to the cervical spine. Prior C5-C6 ACDF. Solid vertebral body ankylosis at this level. T1 superior endplate vertebral compression deformity (with 30-40% vertebral body height loss), new from the prior cervical spine CT of 10/18/2020 but otherwise age-indeterminate. Soft tissues and spinal canal: No prevertebral fluid or swelling. No visible canal hematoma. Prior right hemithyroidectomy. Disc levels: Cervical spondylosis with multilevel disc space narrowing, disc bulges/central disc protrusions and uncovertebral hypertrophy. Posterior disc osteophyte complex at C6-C7. Disc space narrowing is greatest at C6-C7 level (advanced at this level). No appreciable high-grade spinal canal stenosis. Multilevel bony neural foraminal narrowing. Degenerative changes also present at the C1-C2 articulation. Upper chest: The right lung apex is excluded from the field of view. The left lung apex is only minimally included in the field of view. No visible pneumothorax. IMPRESSION: CT head: 1. No evidence of an acute intracranial abnormality. 2. Right frontoparietal and temporal scalp hematoma.  3. Known chronic right MCA territory cortical/subcortical infarct. 4.  Background mild cerebral white matter chronic small vessel ischemic disease. 5. Generalized cerebral atrophy. 6. Mild paranasal sinus disease at the imaged levels as described. CT cervical spine: 1. T1 superior endplate vertebral compression fracture (30-40% vertebral body height loss), new from the prior cervical spine CT of 10/18/2020 but otherwise age-indeterminate. 2. No evidence of an acute cervical spine fracture. 3. Prior C5-C6 ACDF. No evidence of acute hardware compromise. 4. Cervical spondylosis as described. Electronically Signed   By: Jackey Loge D.O.   On: 07/04/2023 12:28    EKG: Independently reviewed.  NSR with rate 74; nonspecific ST changes with no evidence of acute ischemia; +PVC   Labs on Admission: I have personally reviewed the available labs and imaging studies at the time of the admission.  Pertinent labs:    K+ 3.3 Glucose 114 WBC 10.1 Hgb 8.5, 9.1 in 01/2023   Assessment and Plan: Active Problems:   Hypertension   Postherpetic neuralgia   Narcotic dependence (HCC)   Dyslipidemia   GERD   BREAST CANCER, HX OF   Proximal humerus fracture   Fall at home, initial encounter   Staghorn calculus   DNR (do not resuscitate)    Fall Patient with fall at home - having dizzy spells RUE injuries including proximal humerus fracture and distal radius Colles fracture (this appears to be subacute, as she was seen for this issue in orthopedics on 05/05/23) Also with T1 compression fracture; if pain, consider IR consult for kyphoplasty (currently her arm is her primary source of pain) Sling for shoulder Orthopedics to see in AM PT/OT consults [facility] consult, as she appears likely to need SNF rehab given her dependence on a walker at baseline  Dizziness Noted with ambulation recently, per her son She is having this with movement Can't get in to ENT until January Will check orthostatics Will also trial meclizine  COPD Noted on imaging She does not appear to be taking  medications for this issue  HTN Continue amlodipine  HLD She does not appear to be taking medications for this issue at this time   H/o CVA Chronic infarct seen on imaging Not on ASA at home  H/o breast cancer S/p remote B mastectomies (1986) with reconstruction  B staghorn calculi Noted on imaging No current urinary complaints  Chronic pain I have reviewed this patient in the [place] Controlled Substances Reporting System.  He is receiving medications from only one provider and appears to be taking them as prescribed. She is at high risk of opioid misuse or diversion Will continue MSIR without escalation Continue nightly topiramate (h/o shingles)  GERD/constipation Continue pantoprazole and Miralax  DNR Verified from prior hospital documents, in [facility]      Advance Care Planning:   Code Status: Limited: Do not attempt resuscitation (DNR) -DNR-LIMITED -Do Not Intubate/DNI    Consults: Orthopedics; PT; OT; TOC team; nutrition  DVT Prophylaxis: Lovenox  Family Communication: None present; I spoke with her son by telephone  Severity of Illness: The appropriate patient status for this patient is OBSERVATION. Observation status is judged to be reasonable and necessary in order to provide the required intensity of service to ensure the patient's safety. The patient's presenting symptoms, physical exam findings, and initial radiographic and laboratory data in the context of their medical condition is felt to place them at decreased risk for further clinical deterioration. Furthermore, it is anticipated that the patient will be medically stable for discharge from  the hospital within 2 midnights of admission.   Author: Jonah Blue, MD 07/04/2023 7:21 PM  For on call review [email or web address].

## 2023-07-04 NOTE — ED Notes (Signed)
This tech heard patient yelling from the hall asking for help. When entering the room the patient stated that she could not breathe. Vitals show SPO2 levels at 97% on room air. Pt states that "laying at bottom of the bed, I can't see". Patient repositioned in bed x1 assist, procedure tolerated well. Call light in reach. Rn notified.

## 2023-07-04 NOTE — Progress Notes (Signed)
PHARMACIST - PHYSICIAN COMMUNICATION  CONCERNING:  Enoxaparin (Lovenox) for DVT Prophylaxis    RECOMMENDATION: Patient was prescribed enoxaprin 40mg  q24 hours for VTE prophylaxis.   Filed Weights   07/04/23 1059  Weight: 38.6 kg (85 lb)    Body mass index is 15.06 kg/m.  Estimated Creatinine Clearance: 26.8 mL/min (by C-G formula based on SCr of 0.62 mg/dL).   Patient is candidate for enoxaparin 30mg  every 24 hours based on CrCl <70ml/min or Weight <45kg  DESCRIPTION: Pharmacy has adjusted enoxaparin dose per [facility] policy.  Patient is now receiving enoxaparin 30 mg every 24 hours    Foye Deer, PharmD Clinical Pharmacist  07/04/2023 7:18 PM

## 2023-07-04 NOTE — ED Triage Notes (Addendum)
Pt fell this am at home, arrived by EMS. Unwitnessed fall found on right side, C collar in place by EMS. Pt with swelling noted to right shoulder, unsure of head injury.

## 2023-07-04 NOTE — ED Notes (Signed)
Cervical collar removed per provider.

## 2023-07-04 NOTE — ED Notes (Signed)
She is alert and oriented.  She was able to stand and bear weight ok on both legs during transfer to stretcher.

## 2023-07-05 DIAGNOSIS — S52531A Colles' fracture of right radius, initial encounter for closed fracture: Secondary | ICD-10-CM | POA: Diagnosis not present

## 2023-07-05 DIAGNOSIS — I10 Essential (primary) hypertension: Secondary | ICD-10-CM | POA: Diagnosis not present

## 2023-07-05 DIAGNOSIS — S42201A Unspecified fracture of upper end of right humerus, initial encounter for closed fracture: Secondary | ICD-10-CM | POA: Diagnosis not present

## 2023-07-05 DIAGNOSIS — K2100 Gastro-esophageal reflux disease with esophagitis, without bleeding: Secondary | ICD-10-CM | POA: Diagnosis not present

## 2023-07-05 DIAGNOSIS — S52501A Unspecified fracture of the lower end of right radius, initial encounter for closed fracture: Secondary | ICD-10-CM | POA: Diagnosis not present

## 2023-07-05 DIAGNOSIS — N200 Calculus of kidney: Secondary | ICD-10-CM | POA: Diagnosis not present

## 2023-07-05 DIAGNOSIS — R300 Dysuria: Secondary | ICD-10-CM | POA: Diagnosis not present

## 2023-07-05 DIAGNOSIS — E43 Unspecified severe protein-calorie malnutrition: Secondary | ICD-10-CM | POA: Diagnosis not present

## 2023-07-05 DIAGNOSIS — D509 Iron deficiency anemia, unspecified: Secondary | ICD-10-CM | POA: Diagnosis not present

## 2023-07-05 DIAGNOSIS — Z853 Personal history of malignant neoplasm of breast: Secondary | ICD-10-CM | POA: Diagnosis not present

## 2023-07-05 DIAGNOSIS — W19XXXA Unspecified fall, initial encounter: Secondary | ICD-10-CM | POA: Diagnosis not present

## 2023-07-05 DIAGNOSIS — Y92009 Unspecified place in unspecified non-institutional (private) residence as the place of occurrence of the external cause: Secondary | ICD-10-CM | POA: Diagnosis not present

## 2023-07-05 DIAGNOSIS — F1120 Opioid dependence, uncomplicated: Secondary | ICD-10-CM | POA: Diagnosis not present

## 2023-07-05 LAB — VITAMIN B12: Vitamin B-12: 124 pg/mL — ABNORMAL LOW (ref 180–914)

## 2023-07-05 LAB — BASIC METABOLIC PANEL
Anion gap: 7 (ref 5–15)
BUN: 17 mg/dL (ref 8–23)
CO2: 20 mmol/L — ABNORMAL LOW (ref 22–32)
Calcium: 7.9 mg/dL — ABNORMAL LOW (ref 8.9–10.3)
Chloride: 110 mmol/L (ref 98–111)
Creatinine, Ser: 0.65 mg/dL (ref 0.44–1.00)
GFR, Estimated: >60 mL/min (ref >60)
Glucose, Bld: 115 mg/dL — ABNORMAL HIGH (ref 70–99)
Potassium: 4.2 mmol/L (ref 3.5–5.1)
Sodium: 137 mmol/L (ref 135–145)

## 2023-07-05 LAB — URINALYSIS, COMPLETE (UACMP) WITH MICROSCOPIC
Bilirubin Urine: NEGATIVE
Glucose, UA: NEGATIVE mg/dL
Ketones, ur: NEGATIVE mg/dL
Nitrite: NEGATIVE
Protein, ur: 100 mg/dL — AB
Specific Gravity, Urine: 1.027 (ref 1.005–1.030)
WBC, UA: >50 WBC/hpf (ref 0–5)
pH: 7 (ref 5.0–8.0)

## 2023-07-05 LAB — RETICULOCYTES
Immature Retic Fract: 25.2 % — ABNORMAL HIGH (ref 2.3–15.9)
RBC.: 3.23 MIL/uL — ABNORMAL LOW (ref 3.87–5.11)
Retic Count, Absolute: 44.3 10*3/uL (ref 19.0–186.0)
Retic Ct Pct: 1.4 % (ref 0.4–3.1)

## 2023-07-05 LAB — CBC
HCT: 23.6 % — ABNORMAL LOW (ref 36.0–46.0)
Hemoglobin: 7.3 g/dL — ABNORMAL LOW (ref 12.0–15.0)
MCH: 22.7 pg — ABNORMAL LOW (ref 26.0–34.0)
MCHC: 30.9 g/dL (ref 30.0–36.0)
MCV: 73.5 fL — ABNORMAL LOW (ref 80.0–100.0)
Platelets: 190 10*3/uL (ref 150–400)
RBC: 3.21 MIL/uL — ABNORMAL LOW (ref 3.87–5.11)
RDW: 17.5 % — ABNORMAL HIGH (ref 11.5–15.5)
WBC: 6.4 10*3/uL (ref 4.0–10.5)
nRBC: 0 % (ref 0.0–0.2)

## 2023-07-05 LAB — IRON AND TIBC
Iron: 18 ug/dL — ABNORMAL LOW (ref 28–170)
Saturation Ratios: 5 % — ABNORMAL LOW (ref 10.4–31.8)
TIBC: 337 ug/dL (ref 250–450)
UIBC: 319 ug/dL

## 2023-07-05 LAB — FERRITIN: Ferritin: 4 ng/mL — ABNORMAL LOW (ref 11–307)

## 2023-07-05 LAB — FOLATE: Folate: 19.1 ng/mL (ref >5.9)

## 2023-07-05 MED ORDER — ADULT MULTIVITAMIN W/MINERALS CH
1.0000 | ORAL_TABLET | Freq: Every day | ORAL | Status: DC
  Administered 2023-07-06 – 2023-07-11 (×6): 1 via ORAL
  Filled 2023-07-05 (×6): qty 1

## 2023-07-05 MED ORDER — FE FUM-VIT C-VIT B12-FA 460-60-0.01-1 MG PO CAPS
1.0000 | ORAL_CAPSULE | Freq: Two times a day (BID) | ORAL | Status: DC
Start: 2023-07-05 — End: 2023-07-11
  Administered 2023-07-05 – 2023-07-11 (×13): 1 via ORAL
  Filled 2023-07-05 (×14): qty 1

## 2023-07-05 MED ORDER — ENSURE ENLIVE PO LIQD
237.0000 mL | Freq: Three times a day (TID) | ORAL | Status: DC
  Administered 2023-07-05 – 2023-07-11 (×14): 237 mL via ORAL

## 2023-07-05 MED ORDER — KETOROLAC TROMETHAMINE 15 MG/ML IJ SOLN
15.0000 mg | Freq: Once | INTRAMUSCULAR | Status: AC
  Administered 2023-07-05: 15 mg via INTRAVENOUS
  Filled 2023-07-05: qty 1

## 2023-07-05 NOTE — Consult Note (Signed)
ORTHOPAEDIC CONSULTATION  REQUESTING PHYSICIAN: Arnetha Courser, MD  Chief Complaint:   Right proximal humerus fracture, right distal radius fracture  History of Present Illness: [name] is a 89 y.o. female with medical history significant of breast cancer, CVA, depression, HTN, and HLD who presented on 12/26 after an unwitnessed fall at home.  Per report patient uses a walker at baseline.  Patient is able to tell me the year and that she is at a hospital but is unable to tell me what happened and that she hurt her right arm.  Patient reports she is right-hand dominant is unsure if she is injured her wrist in the past.  Primarily reports pain in her right shoulder.  Past Medical History:  Diagnosis Date   Arrhythmia    Arthritis    Atypical chest pain    Lexiscan myoview (5/11) with EF 84%, normal wall motion, small fixed apical  perfusion defect likely due to breast attenuation, no evidence for ischemia or infarction.  **Patient had an   Basal cell carcinoma 07/28/19 [facility]   R pretibial   Breast cancer (HCC)    Bilateral mastectomies 1986.   CVA (cerebral infarction) 10/12   right lacunar   Depression    Dyspnea    Echo (5/11) was a difficult study due to breast implants but showed normal LV and RV size and systolic function.       GERD (gastroesophageal reflux disease)    Headache    s/p shingles - right side of head   History of partial thyroidectomy    Hyperlipidemia    Hypertension    in past - no current meds/issues   Obstruction of intestine (HCC)    Partial small bowel obstruction (HCC) 7/14   no surgery   Superficial basal cell carcinoma 06/18/2019   R mid to distal pretibial   Wears dentures    full upper   Zoster     with Post-herpetic neuralgia   Past Surgical History:  Procedure Laterality Date   ABDOMINAL HYSTERECTOMY     BREAST SURGERY Bilateral    cancer - mastectomy and implant insertion    INTRAMEDULLARY (IM) NAIL INTERTROCHANTERIC Left 06/19/2020   Procedure: INTRAMEDULLARY (IM) NAIL INTERTROCHANTRIC;  Surgeon: Deeann Saint, MD;  Location: [facility] ORS;  Service: Orthopedics;  Laterality: Left;   IR THORACENTESIS ASP PLEURAL SPACE W/IMG GUIDE  10/02/2021   MASTECTOMY  1980   bilateral   NECK SURGERY     TARSORRHAPHY Bilateral 08/16/2015   Procedure: MINOR TARSORRAPHY LATERAL PLACEMENT;  Surgeon: Imagene Riches, MD;  Location: [facility];  Service: Ophthalmology;  Laterality: Bilateral;   VAGINAL DELIVERY     Social History   Socioeconomic History   Marital status: Widowed    Spouse name: Not on file   Number of children: 2   Years of education: Not on file   Highest education level: Not on file  Occupational History   Occupation: Psychologist, educational: RETIRED  Tobacco Use   Smoking status: Former    Passive exposure: Never   Smokeless tobacco: Never  Substance and Sexual Activity   Alcohol use: No    Alcohol/week: 0.0 standard drinks of alcohol   Drug use: No   Sexual activity: Not Currently  Other Topics Concern   Not on file  Social History Narrative   No living will   No health care POA but requests  son and daughter-in-law [name] to do this   Would like attempts  at resuscitation   No feeding tube       MOST form done 08/17/21---she wants everything except tube feeds   Social Drivers of Health   Financial Resource Strain: Not on file  Food Insecurity: Not on file  Transportation Needs: Not on file  Physical Activity: Not on file  Stress: Not on file  Social Connections: Not on file   Family History  Problem Relation Age of Onset   Heart disease Son        open heart surgery for a blockage   Heart Problems Son    Diabetes Son    Parkinson's disease Son    Allergies  Allergen Reactions   Duloxetine     REACTION: N/V Mental status change and trouble with balance   Cymbalta [Duloxetine Hcl]     Unsure of reaction   Maxitrol  [Neomycin-Polymyxin-Dexameth]     Unsure of reaction   Pregabalin     REACTION: SOB, swollen lips   Tobradex [Tobramycin-Dexamethasone]     Unsure of reaction   Prior to Admission medications   Medication Sig Start Date End Date Taking? Authorizing Provider  acyclovir (ZOVIRAX) 200 MG capsule TAKE ONE CAPSULE BY MOUTH TWO TIMES DAILY 10/29/22  Yes Tillman Abide I, MD  amLODipine (NORVASC) 5 MG tablet Take 1 tablet (5 mg total) by mouth daily. 10/29/22  Yes Karie Schwalbe, MD  antiseptic oral rinse (BIOTENE) LIQD 15 mLs by Mouth Rinse route as needed for dry mouth.   Yes [provider]  azelastine (ASTELIN) 0.1 % nasal spray Place 2 sprays into both nostrils 2 (two) times daily. 05/20/20  Yes [provider]  fexofenadine (ALLEGRA) 180 MG tablet Take 180 mg by mouth daily.   Yes [provider]  fluticasone (FLONASE) 50 MCG/ACT nasal spray PLACE TWO SPRAYS INTO BOTH NOSTRILS DAILY AS NEEDED 12/24/22  Yes Tillman Abide I, MD  LORazepam (ATIVAN) 1 MG tablet TAKE ONE HALF (1/2) TO ONE TABLET BY MOUTH TWICE DAILY AS NEEDED FOR ANXIETY 06/24/23  Yes Karie Schwalbe, MD  morphine (MSIR) 15 MG tablet TAKE ONE TABLET BY MOUTH THREE TIMES DAILY AS NEEDED FOR PAIN 06/10/23  Yes Karie Schwalbe, MD  Multiple Vitamin (MULTIVITAMIN) tablet Take 1 tablet by mouth once daily   Yes [provider]  omeprazole (PRILOSEC) 20 MG capsule TAKE ONE CAPSULE BY MOUTH DAILY 10/29/22  Yes Tillman Abide I, MD  polyethylene glycol (MIRALAX / GLYCOLAX) packet Take 17 g by mouth at bedtime.   Yes [provider]  RESTASIS 0.05 % ophthalmic emulsion Place 1 drop into both eyes 2 (two) times daily. 02/24/18  Yes [provider]  topiramate (TOPAMAX) 50 MG tablet TAKE ONE TABLET BY MOUTH AT BEDTIME FOR COMPLICATION OF SHINGLES 10/29/22  Yes Karie Schwalbe, MD  feeding supplement (ENSURE ENLIVE / ENSURE PLUS) LIQD Take 237 mLs by mouth 3 (three) times daily between  meals. 10/12/21   Arnetha Courser, MD   CT CHEST ABDOMEN PELVIS W CONTRAST Result Date: 07/04/2023 CLINICAL DATA:  Polytrauma, blunt.  Fall EXAM: CT CHEST, ABDOMEN, AND PELVIS WITH CONTRAST TECHNIQUE: Multidetector CT imaging of the chest, abdomen and pelvis was performed following the standard protocol during bolus administration of intravenous contrast. RADIATION DOSE REDUCTION: This exam was performed according to the departmental dose-optimization program which includes automated exposure control, adjustment of the mA and/or kV according to patient size and/or use of iterative reconstruction technique. CONTRAST:  75mL OMNIPAQUE IOHEXOL 350 MG/ML SOLN COMPARISON:  CT chest 10/01/2021. FINDINGS: CHEST: Cardiovascular: No aortic injury. The thoracic aorta is normal in caliber. Moderate to severe atherosclerotic plaque. Mitral annular calcification. Coronary artery calcification. The heart is normal in size. No significant pericardial effusion. The main pulmonary artery is normal in caliber. No central or proximal segmental pulmonary embolus. Limited evaluation more distally due to timing of contrast and motion artifact. Mediastinum/Nodes: No pneumomediastinum. No mediastinal hematoma. The esophagus is unremarkable. Redemonstration of a heterogeneous left thyroid gland-no further follow-up indicated. Right thyroid gland not visualized-likely surgically removed. The central airways are patent. No mediastinal, hilar, or axillary lymphadenopathy. Lungs/Pleura: Biapical pleural/pulmonary scarring. Mild interlobular septal wall thickening. Mild centrilobular emphysematous changes. Mild paraseptal emphysematous changes. No focal consolidation. No pulmonary nodule. No pulmonary mass. No pulmonary contusion or laceration. No pneumatocele formation. Moderate volume right pleural effusion. Trace volume left pleural effusion. Bilateral lower lobe passive atelectasis. No pneumothorax. No hemothorax. Musculoskeletal/Chest wall:  No chest wall mass.  Bilateral breast implants. Acute comminuted and displaced impacted right humeral head and neck fracture. Likely right calcific tendinosis. Old healed left humeral proximal fracture. Old healed right distal radial and ulnar fractures. No acute rib or sternal fracture. No spinal fracture. Interval development of vague superior endplate concavity of the T1 and T3 levels. Cervical spine surgical hardware. ABDOMEN / PELVIS: Hepatobiliary: Not enlarged. No focal lesion. No laceration or subcapsular hematoma. The gallbladder is otherwise unremarkable with no radio-opaque gallstones. No biliary ductal dilatation. Pancreas: Normal pancreatic contour. No main pancreatic duct dilatation. Spleen: Not enlarged. No focal lesion. No laceration, subcapsular hematoma, or vascular injury. Adrenals/Urinary Tract: No nodularity bilaterally. Bilateral kidneys enhance symmetrically. Bilateral staghorn calculi with largest on the left measuring 3 x 1.7 cm and 2.2 x 1.3 cm on the right. Interval increase in size of a 1.7 x 1.1 cm (from 1.1 x 0.9 cm) right hypodense lesion that demonstrates a density of 33 Hounsfield units. No hydronephrosis. No contusion, laceration, or subcapsular hematoma. No injury to the vascular structures or collecting systems. No hydroureter. The urinary bladder is unremarkable. Stomach/Bowel: No small or large bowel wall thickening or dilatation. Stool throughout the colon. The appendix is unremarkable. Vasculature/Lymphatics: Severe atherosclerotic plaque. No abdominal aorta or iliac aneurysm. No active contrast extravasation or pseudoaneurysm. No abdominal, pelvic, inguinal lymphadenopathy. Reproductive: Normal. Other: No simple free fluid ascites. No pneumoperitoneum. No hemoperitoneum. No mesenteric hematoma identified. No organized fluid collection. Musculoskeletal: No significant soft tissue hematoma. No acute pelvic fracture. No spinal fracture. Stable chronic L1 compression fracture.  Diffusely decreased bone density. Partially visualized left femur intramedullary nail fixation. Ports and Devices: None. IMPRESSION: 1. Acute comminuted and displaced impacted right humeral head and neck fracture. 2. No acute intrathoracic, intra-abdominal, intrapelvic traumatic injury. 3. Interval development of vague superior endplate concavity of the T1 and T3 levels. Correlate with point tenderness to palpation to evaluate for an acute component. 4. Otherwise no definite acute fracture or traumatic malalignment of the thoracic or lumbar spine. 5. Mild pulmonary edema with moderate right and trace left pleural effusions. 6. Bilateral staghorn calculi with largest on the left measuring 3 x 1.7 cm and 2.2 x 1.3 cm on the right. 7. Indeterminate 1.7 x 1.1 cm right hypodense lesion. 8. Aortic Atherosclerosis (ICD10-I70.0) and Emphysema (ICD10-J43.9). 9. Diffusely decreased bone density. Electronically Signed   By: Tish Frederickson M.D.   On: 07/04/2023 17:19   DG Chest 1 View Result Date: 07/04/2023 CLINICAL DATA:  Fall. EXAM: CHEST  1 VIEW COMPARISON:  10/11/2021. FINDINGS: Hyperinflated lungs consistent with  COPD. Small right-sided pleural effusion. Unremarkable cardiac silhouette. No pneumothorax. No focal consolidation. Normal pulmonary vasculature. Calcified aorta. Osseous structures are grossly intact. IMPRESSION: COPD. Small right-sided pleural effusion. Electronically Signed   By: Layla Maw M.D.   On: 07/04/2023 12:31   DG Wrist Complete Right Result Date: 07/04/2023 CLINICAL DATA:  Fall. EXAM: RIGHT WRIST - COMPLETE 3 VIEW COMPARISON:  None Available. FINDINGS: Osteopenia. Transverse fracture with dorsal angulation distal radius. Soft tissue swelling at the fracture site. Osseous structures are otherwise intact. IMPRESSION: Distal radius Colles fracture. Electronically Signed   By: Layla Maw M.D.   On: 07/04/2023 12:29   DG Shoulder Right Result Date: 07/04/2023 CLINICAL DATA:  Fall.  EXAM: RIGHT SHOULDER - 3 VIEW COMPARISON:  None Available. FINDINGS: Osseous structures are osteopenic. Acromioclavicular degenerative changes with osteophytes. Fracture of the proximal humerus surgical neck with a few mm displacement. There is a associated soft tissue swelling. IMPRESSION: Proximal humerus fracture. Electronically Signed   By: Layla Maw M.D.   On: 07/04/2023 12:29   CT Head Wo Contrast Result Date: 07/04/2023 CLINICAL DATA:  Provided history: Neck trauma. Head trauma, minor. Additional history provided: Fall. Right shoulder swelling. EXAM: CT HEAD WITHOUT CONTRAST CT CERVICAL SPINE WITHOUT CONTRAST TECHNIQUE: Multidetector CT imaging of the head and cervical spine was performed following the standard protocol without intravenous contrast. Multiplanar CT image reconstructions of the cervical spine were also generated. RADIATION DOSE REDUCTION: This exam was performed according to the departmental dose-optimization program which includes automated exposure control, adjustment of the mA and/or kV according to patient size and/or use of iterative reconstruction technique. COMPARISON:  Head CT 10/01/2021.  Cervical spine CT 10/18/2020. FINDINGS: CT HEAD FINDINGS Brain: Generalized cerebral atrophy. Chronic cortical/subcortical infarct again noted within the right frontal lobe/insula, right temporal lobe and right parietal lobe (MCA vascular territory). Background mild patchy and ill-defined hypoattenuation within the cerebral white matter, nonspecific but compatible with chronic small vessel disease. There is no acute intracranial hemorrhage. No acute demarcated cortical infarct. No extra-axial fluid collection. No evidence of an intracranial mass. No midline shift. Vascular: No hyperdense vessel.  Atherosclerotic calcifications. Skull: No calvarial fracture or aggressive osseous lesion. Sinuses/Orbits: No mass or acute finding within the imaged orbits. Left scleral buckle. Prior left ocular  lens replacement. Mild mucosal thickening within the bilateral maxillary sinuses at the imaged levels. Moderate mucosal thickening within the right sphenoid sinus. Minimal mucosal thickening scattered within bilateral ethmoid air cells. Other: Right frontoparietal and temporal scalp hematoma. CT CERVICAL SPINE FINDINGS Alignment: No significant spondylolisthesis. Skull base and vertebrae: The basion-dental and atlanto-dental intervals are maintained.No evidence of acute fracture to the cervical spine. Prior C5-C6 ACDF. Solid vertebral body ankylosis at this level. T1 superior endplate vertebral compression deformity (with 30-40% vertebral body height loss), new from the prior cervical spine CT of 10/18/2020 but otherwise age-indeterminate. Soft tissues and spinal canal: No prevertebral fluid or swelling. No visible canal hematoma. Prior right hemithyroidectomy. Disc levels: Cervical spondylosis with multilevel disc space narrowing, disc bulges/central disc protrusions and uncovertebral hypertrophy. Posterior disc osteophyte complex at C6-C7. Disc space narrowing is greatest at C6-C7 level (advanced at this level). No appreciable high-grade spinal canal stenosis. Multilevel bony neural foraminal narrowing. Degenerative changes also present at the C1-C2 articulation. Upper chest: The right lung apex is excluded from the field of view. The left lung apex is only minimally included in the field of view. No visible pneumothorax. IMPRESSION: CT head: 1. No evidence of an acute intracranial abnormality. 2.  Right frontoparietal and temporal scalp hematoma. 3. Known chronic right MCA territory cortical/subcortical infarct. 4. Background mild cerebral white matter chronic small vessel ischemic disease. 5. Generalized cerebral atrophy. 6. Mild paranasal sinus disease at the imaged levels as described. CT cervical spine: 1. T1 superior endplate vertebral compression fracture (30-40% vertebral body height loss), new from the  prior cervical spine CT of 10/18/2020 but otherwise age-indeterminate. 2. No evidence of an acute cervical spine fracture. 3. Prior C5-C6 ACDF. No evidence of acute hardware compromise. 4. Cervical spondylosis as described. Electronically Signed   By: Jackey Loge D.O.   On: 07/04/2023 12:28   CT Cervical Spine Wo Contrast Result Date: 07/04/2023 CLINICAL DATA:  Provided history: Neck trauma. Head trauma, minor. Additional history provided: Fall. Right shoulder swelling. EXAM: CT HEAD WITHOUT CONTRAST CT CERVICAL SPINE WITHOUT CONTRAST TECHNIQUE: Multidetector CT imaging of the head and cervical spine was performed following the standard protocol without intravenous contrast. Multiplanar CT image reconstructions of the cervical spine were also generated. RADIATION DOSE REDUCTION: This exam was performed according to the departmental dose-optimization program which includes automated exposure control, adjustment of the mA and/or kV according to patient size and/or use of iterative reconstruction technique. COMPARISON:  Head CT 10/01/2021.  Cervical spine CT 10/18/2020. FINDINGS: CT HEAD FINDINGS Brain: Generalized cerebral atrophy. Chronic cortical/subcortical infarct again noted within the right frontal lobe/insula, right temporal lobe and right parietal lobe (MCA vascular territory). Background mild patchy and ill-defined hypoattenuation within the cerebral white matter, nonspecific but compatible with chronic small vessel disease. There is no acute intracranial hemorrhage. No acute demarcated cortical infarct. No extra-axial fluid collection. No evidence of an intracranial mass. No midline shift. Vascular: No hyperdense vessel.  Atherosclerotic calcifications. Skull: No calvarial fracture or aggressive osseous lesion. Sinuses/Orbits: No mass or acute finding within the imaged orbits. Left scleral buckle. Prior left ocular lens replacement. Mild mucosal thickening within the bilateral maxillary sinuses at the  imaged levels. Moderate mucosal thickening within the right sphenoid sinus. Minimal mucosal thickening scattered within bilateral ethmoid air cells. Other: Right frontoparietal and temporal scalp hematoma. CT CERVICAL SPINE FINDINGS Alignment: No significant spondylolisthesis. Skull base and vertebrae: The basion-dental and atlanto-dental intervals are maintained.No evidence of acute fracture to the cervical spine. Prior C5-C6 ACDF. Solid vertebral body ankylosis at this level. T1 superior endplate vertebral compression deformity (with 30-40% vertebral body height loss), new from the prior cervical spine CT of 10/18/2020 but otherwise age-indeterminate. Soft tissues and spinal canal: No prevertebral fluid or swelling. No visible canal hematoma. Prior right hemithyroidectomy. Disc levels: Cervical spondylosis with multilevel disc space narrowing, disc bulges/central disc protrusions and uncovertebral hypertrophy. Posterior disc osteophyte complex at C6-C7. Disc space narrowing is greatest at C6-C7 level (advanced at this level). No appreciable high-grade spinal canal stenosis. Multilevel bony neural foraminal narrowing. Degenerative changes also present at the C1-C2 articulation. Upper chest: The right lung apex is excluded from the field of view. The left lung apex is only minimally included in the field of view. No visible pneumothorax. IMPRESSION: CT head: 1. No evidence of an acute intracranial abnormality. 2. Right frontoparietal and temporal scalp hematoma. 3. Known chronic right MCA territory cortical/subcortical infarct. 4. Background mild cerebral white matter chronic small vessel ischemic disease. 5. Generalized cerebral atrophy. 6. Mild paranasal sinus disease at the imaged levels as described. CT cervical spine: 1. T1 superior endplate vertebral compression fracture (30-40% vertebral body height loss), new from the prior cervical spine CT of 10/18/2020 but otherwise age-indeterminate. 2. No evidence  of an  acute cervical spine fracture. 3. Prior C5-C6 ACDF. No evidence of acute hardware compromise. 4. Cervical spondylosis as described. Electronically Signed   By: Jackey Loge D.O.   On: 07/04/2023 12:28    Positive ROS: All other systems have been reviewed and were otherwise negative with the exception of those mentioned in the HPI and as above.  Physical Exam: General:  Alert, no acute distress Psychiatric:  Patient is oriented to year and being at a hospital Cardiovascular:  No pedal edema Respiratory:  No wheezing, non-labored breathing GI:  Abdomen is soft and non-tender Skin:  No lesions in the area of chief complaint Neurologic:  Sensation intact distally Lymphatic:  No axillary or cervical lymphadenopathy  Orthopedic Exam:  Right upper extremity Ecchymosis and swelling noted over the right shoulder and to the midshaft of the humerus no significant swelling or ecchymosis over the forearm or wrist Mild extension deformity to the wrist Tender to palpation over the shoulder Tender to deep palpation over the wrist and end range of motion minimal pain with passive mid range of motion No palpable fracture motion at the wrist Neurovascular intact able to extend and flex the wrist and fingers with sensation intact to all fingertips chronic bony deformity to the fingers consistent with arthritis \\Compartments  all soft  X-rays:  X-ray of the right shoulder images and report reviewed by myself.  Patient has a right proximal humerus fracture impacted and neutral alignment with minimal displacement. X-rays of the right wrist images reviewed by myself show a distal radius fracture with extension softened bony edges unsure if acute versus subacute fracture on imaging.  Agree with radiologist interpretation of both image series  Assessment: Right proximal humerus fracture Subacute right distal radius fracture  Plan: I reviewed the clinical and radiographic findings with the patient and  discussed the treatment options for her proximal femurs fracture distal radius fracture.  On exam today her distal radius fracture appears more subacute in nature as there does not appear to be fracture motion on exam and she is much less tender there than she is over the shoulder.  Given the subacute nature of the fracture based on his physical exam and the patient's reasonable function of the hand at this time on exam discussed initial treatment and a splint and I placed her in a removable wrist splint.  With regards to the proximal humerus fracture the fracture is in appropriate alignment and we will proceed with a nonoperative course of management for that as well with a sling which I applied.  Patient tolerated the application of the removable wrist splint and the sling well without complications.  She will follow-up in [facility] in 2 weeks for repeat x-rays.  I did advise her on the risk of nonunion, malunion, delayed union, need for possible surgical intervention in both the wrist or the shoulder in the future.  All questions answered and patient agrees with the plan will follow-up in the office.  [facility] 2 weeks    Reinaldo Berber MD  Beeper #:  [phone]  07/05/2023 8:06 AM

## 2023-07-05 NOTE — Hospital Course (Addendum)
Taken from H&P.  [name] is a 89 y.o. female with medical history significant of breast cancer, CVA, depression, HTN, and HLD who presented on 12/26 with a fall.  She reports that she lives alone and uses a walker to get around.  She went to the bathroom and isn't sure what happened but she fell and hurt her arm.  No other injuries.   She was found to have right proximal humeral fracture and also seen a distal radial fracture which seems subacute but patient was unable to explain further injuries.  Orthopedic saw the patient this morning and recommending conservative management and her distal radius fracture looks like subacute to chronic.  They are recommending pain control, immobilization with sling and outpatient follow-up in 2 weeks.  Labs with worsening hemoglobin at 7.3 today, anemia panel consistent with iron deficiency, pending B12 levels.  Checking UA urine culture as she was complaining of dysuria.  PT is recommending SNF  12/28: Vital stable, hemoglobin decreased to 6.8-ordered 1 unit of PRBC, Anemia panel with B12 of 124, ferritin 4, iron deficiency and normal folate. Also starting IM B12 supplement for 3 days.  UA with large leukocytes and bacteria, urine cultures pending-starting on ceftriaxone.

## 2023-07-05 NOTE — Progress Notes (Signed)
Initial Nutrition Assessment  DOCUMENTATION CODES:   Underweight, Severe malnutrition in context of chronic illness  INTERVENTION:   -Obtain new wt -Downgrade diet to dysphagia 1 for ease of intake -Ensure Enlive po TID, each supplement provides 350 kcal and 20 grams of protein.  -MVI with minerals daily -Feeding assistance with meals  NUTRITION DIAGNOSIS:   Severe Malnutrition related to chronic illness (CVA, stricture) as evidenced by severe fat depletion, severe muscle depletion.  GOAL:   Patient will meet greater than or equal to 90% of their needs  MONITOR:   PO intake, Supplement acceptance  REASON FOR ASSESSMENT:   Consult Assessment of nutrition requirement/status, Hip fracture protocol  ASSESSMENT:   Pt with medical history significant of breast cancer, CVA, depression, HTN, and HLD who presented on 12/26 with a fall.  Pt admitted s/p fall and with rt proximal humerus fracture, distal radius colles fractures, and T1 compression fracture.   Per orthopedics notes, no plan for operative management. Pt with rt arm sling.   Case discussed with LPN, who reports pt is able to swallow PO's and medications with applesauce. She did not observe any difficulty with liquids.   Spoke with pt at bedside, who reports "feeling horrible" at time of visit. LPN shared pt had just arrived to the floor. Pt reports decreased oral intake PTA, however, she does consume 3 meals per day. Pt does not have teeth or use dentures per her report and consumes pureed diet at home for ease of chewing and swallowing. Pt also shares that she has history of esophageal stricture, which can make it difficult to eat solid foods, however, has not been offered treatment for this as "I can't get my esophagus stretched because I'm too old". Pt reports no difficulty tolerating puree consistencies or thin liquids. Pt also reports consuming 1-2 Boost at home daily.   RD will obtain new wt as unsure of documented  wt is a stated wt vs measured wt (wt is identical to reading on 10/29/22). RD will continue to follow for weight trends.   Discussed importance of good meal and supplement intake to promote healing. Pt amenable to continue supplements.   Medications reviewed and include colace, lovenox, protonix, and miralax.   Per TOC notes, plan for SNF at discharge.   Labs reviewed.    NUTRITION - FOCUSED PHYSICAL EXAM:  Flowsheet Row Most Recent Value  Orbital Region Severe depletion  Upper Arm Region Severe depletion  Thoracic and Lumbar Region Severe depletion  Buccal Region Severe depletion  Temple Region Severe depletion  Clavicle Bone Region Severe depletion  Clavicle and Acromion Bone Region Severe depletion  Scapular Bone Region Severe depletion  Dorsal Hand Severe depletion  Patellar Region Severe depletion  Anterior Thigh Region Severe depletion  Posterior Calf Region Severe depletion  Edema (RD Assessment) None  Hair Reviewed  Eyes Reviewed  Mouth Reviewed  Skin Reviewed  Nails Reviewed       Diet Order:   Diet Order             DIET - DYS 1 Room service appropriate? Yes; Fluid consistency: Thin  Diet effective now                   EDUCATION NEEDS:   Education needs have been addressed  Skin:  Skin Assessment: Reviewed RN Assessment  Last BM:  06/30/23  Height:   Ht Readings from Last 1 Encounters:  07/04/23 5\' 3"  (1.6 m)    Weight:  Wt Readings from Last 1 Encounters:  07/04/23 38.6 kg    Ideal Body Weight:  52.3 kg  BMI:  Body mass index is 15.06 kg/m.  Estimated Nutritional Needs:   Kcal:  1400-1600  Protein:  65-80 grams  Fluid:  > 1.4 L    Levada Schilling, RD, LDN, CDCES Registered Dietitian III Certified Diabetes Care and Education Specialist If unable to reach this RD, please use "RD Inpatient" group chat on secure chat between hours of 8am-4 pm daily

## 2023-07-05 NOTE — TOC Initial Note (Signed)
Transition of Care [facility]) - Initial/Assessment Note    Patient Details  Name: [name] MRN: [id number] Date of Birth: 1928/01/14  Transition of [facility]) CM/SW Contact:    Marlowe Sax, RN Phone Number: 07/05/2023, 11:18 AM  Clinical Narrative:                  Patient lives alone and walks with a RW at [facility], her family brings food and she warms it up, is is looking forward to getting back home but understands that she will need to go to STR first .Bedsearch sent, FL2 completed, PASSr obtained       Patient   Goals and CMS Choice            Expected Discharge Plan and Services                                              Prior Living Arrangements/Services                       Activities of Daily Living      Permission Sought/Granted                  Emotional Assessment              Admission diagnosis:  Fall at home, initial encounter [W19.Lorne Skeens, Y92.009] Patient Active Problem List   Diagnosis Date Noted   Closed fracture of right distal radius 07/05/2023   Fall at home, initial encounter 07/04/2023   Staghorn calculus 07/04/2023   DNR (do not resuscitate) 07/04/2023   Malnutrition of moderate degree (HCC) 11/20/2021   Anxiety disorder 10/02/2021   Chronic diastolic CHF (congestive heart failure) (HCC) 10/02/2021   SVT (supraventricular tachycardia) (HCC) 10/02/2021   Benzodiazepine dependence (HCC) 04/17/2018   PAD (peripheral artery disease) (HCC) 07/22/2017   Narcotic dependence (HCC) 01/03/2017   Proximal humerus fracture 12/20/2015   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 - [place], [place] - [address] [place] [address] Phone: [phone] Fax: [phone]     Social Drivers of Health (SDOH) Social History: SDOH Screenings   Depression (PHQ2-9): Low Risk  (10/29/2022)  Tobacco Use: Medium Risk (07/04/2023)   SDOH Interventions:     Readmission Risk Interventions     No data to display

## 2023-07-05 NOTE — Assessment & Plan Note (Signed)
-  Continue home amlodipine 

## 2023-07-05 NOTE — Assessment & Plan Note (Signed)
History of intermittent dizziness. Resulted in right proximal humerus fracture and a subacute distal radius fracture.  T1 compression fracture. Orthopedic was consulted and they are recommending conservative management with immobilization of both shoulder and wrist.  They will follow-up as outpatient in 2 weeks -PT and OT are recommending SNF -Continue with pain management and supportive care -[facility] consult for placement

## 2023-07-05 NOTE — Progress Notes (Signed)
Progress Note   Patient: [name] ION:[id number] DOB: [birth year] DOA: 07/04/2023     0 DOS: the patient was seen and examined on 07/05/2023   Brief hospital course: Taken from H&P.  [name] is a 89 y.o. female with medical history significant of breast cancer, CVA, depression, HTN, and HLD who presented on 12/26 with a fall.  She reports that she lives alone and uses a walker to get around.  She went to the bathroom and isn't sure what happened but she fell and hurt her arm.  No other injuries.   She was found to have right proximal humeral fracture and also seen a distal radial fracture which seems subacute but patient was unable to explain further injuries.  Orthopedic saw the patient this morning and recommending conservative management and her distal radius fracture looks like subacute to chronic.  They are recommending pain control, immobilization with sling and outpatient follow-up in 2 weeks.  Labs with worsening hemoglobin at 7.3 today, anemia panel consistent with iron deficiency, pending B12 levels.  Checking UA urine culture as she was complaining of dysuria.  PT is recommending SNF  Assessment and Plan: * Fall at home, initial encounter History of intermittent dizziness. Resulted in right proximal humerus fracture and a subacute distal radius fracture.  T1 compression fracture. Orthopedic was consulted and they are recommending conservative management with immobilization of both shoulder and wrist.  They will follow-up as outpatient in 2 weeks -PT and OT are recommending SNF -Continue with pain management and supportive care -[facility] consult for placement  Dysuria Patient was complaining of dysuria.  No leukocytosis or fever -UA and urine cultures ordered.  Hypertension -Continue home amlodipine  Narcotic dependence (HCC) History of chronic pain.  [place] controlled substance reporting system was reviewed by admitting provider. Might be causing dizziness resulted in  fall. Avoid escalating opioid therapy -Continue home meds  Microcytic anemia Hemoglobin at 7.3, decreased from 8.5 on admission.  No obvious bleeding, anemia panel with iron deficiency, B12 levels pending. -Started on supplement -Continue to monitor  Staghorn calculus Noted on imaging  No obvious obstruction at this time  BREAST CANCER, HX OF S/p remote B mastectomies (1986) with reconstruction   Protein-calorie malnutrition, severe Estimated body mass index is 15.06 kg/m as calculated from the following:   Height as of this encounter: 5\' 3"  (1.6 m).   Weight as of this encounter: 38.6 kg.   -Dietitian consult      Subjective: Patient was feeling cold and complaining of right shoulder pain when seen today.  She lives alone and family bring food for her which she just warm up.  Per patient she was walking with the help of walker at home.  Physical Exam: Vitals:   07/05/23 0215 07/05/23 0238 07/05/23 0945 07/05/23 1017  BP:  (!) 140/50 (!) 109/35 (!) 135/55  Pulse: 75 75 66 66  Resp:  15 15 15   Temp:  98.8 F (37.1 C) 98.5 F (36.9 C) (!) 97.5 F (36.4 C)  TempSrc:  Oral Axillary Oral  SpO2: 95% 99% 93% 95%  Weight:      Height:       General.  Frail and severely malnourished elderly lady, in no acute distress. Pulmonary.  Lungs clear bilaterally, normal respiratory effort. CV.  Regular rate and rhythm, no JVD, rub or murmur. Abdomen.  Soft, nontender, nondistended, BS positive. CNS.  Alert and oriented .  No focal neurologic deficit. Extremities.  No edema, pulses intact and  symmetrical. Right wrist and shoulder in sling  Data Reviewed: Prior data reviewed  Family Communication: Talked with son on phone.  Disposition: Status is: Observation The patient will require care spanning > 2 midnights and should be moved to inpatient because: Severity of illness  Planned Discharge Destination: Skilled nursing facility  DVT prophylaxis.  Lovenox Time spent: 45  minutes  This record has been created using Conservation officer, historic buildings. Errors have been sought and corrected,but may not always be located. Such creation errors do not reflect on the standard of care.   Author: Arnetha Courser, MD 07/05/2023 3:13 PM  For on call review [email or web address].

## 2023-07-05 NOTE — Evaluation (Signed)
Physical Therapy Evaluation Patient Details Name: [name] MRN: [id number] DOB: April 14, 1928 Today's Date: 07/05/2023  History of Present Illness  Pt is a 89 y.o. female with medical history significant of breast cancer, CVA, depression, HTN, COPD, chronic pain, and HLD who presented on 12/26 after an unwitnessed fall at home.  MD assessment includes: dizziness with fall with noted right proximal humerus fracture and subacute right distal radius fracture to be managed conservatively.  Of note, pt reports poor vision at baseline.   Clinical Impression  Pt lethargic but improved with verbal stimulus.  Pt required significant often +2 physical assistance with functional task assessment per below and presented with poor sitting as well as standing balance.  Pt somewhat confused with difficulty providing history and was able to follow 1-step commands only with extra time and cuing.  Pt reported no adverse symptoms during the session other than RUE pain in the upper arm, no pain noted in the wrist.  Pt is a very high risk for future falls and will benefit from continued PT services upon discharge to safely address deficits listed in patient problem list for decreased caregiver assistance and eventual return to PLOF.       If plan is discharge home, recommend the following: Two people to help with walking and/or transfers;A lot of help with bathing/dressing/bathroom;Assistance with cooking/housework;Assistance with feeding;Direct supervision/assist for medications management;Direct supervision/assist for financial management;Assist for transportation   Can travel by private vehicle   No    Equipment Recommendations Other (comment) (TBD at next venue of care)  Recommendations for Other Services       Functional Status Assessment Patient has had a recent decline in their functional status and/or demonstrates limited ability to make significant improvements in function in a reasonable and  predictable amount of time     Precautions / Restrictions Precautions Precautions: Fall Required Braces or Orthoses: Sling Restrictions Weight Bearing Restrictions Per Provider Order: Yes RUE Weight Bearing Per Provider Order: Non weight bearing Other Position/Activity Restrictions: Sling and wrist splint to RUE      Mobility  Bed Mobility Overal bed mobility: Needs Assistance Bed Mobility: Supine to Sit, Sit to Supine     Supine to sit: Max assist Sit to supine: Max assist   General bed mobility comments: Max A for BLE and trunk control, very poor static sitting balance at the EOB initially but improved somewhat during the session    Transfers Overall transfer level: Needs assistance Equipment used: 1 person hand held assist Transfers: Sit to/from Stand Sit to Stand: +2 physical assistance, Mod assist, Max assist           General transfer comment: Pt required heavy +2 assist to come to standing and to prevent LOB while standing    Ambulation/Gait Ambulation/Gait assistance: Mod assist, +2 physical assistance Gait Distance (Feet): 2 Feet Assistive device: 1 person hand held assist Gait Pattern/deviations: Step-to pattern, Trunk flexed, Shuffle Gait velocity: decreased     General Gait Details: Pt able to take several very small, effortful, shuffling steps at the EOB before fatiguing and needing to return to sitting  Stairs            Wheelchair Mobility     Tilt Bed    Modified Rankin (Stroke Patients Only)       Balance Overall balance assessment: Needs assistance Sitting-balance support: Single extremity supported Sitting balance-Leahy Scale: Poor Sitting balance - Comments: L lateral lean in sitting that improved as session progressed Postural control: Left  lateral lean Standing balance support: Single extremity supported, During functional activity Standing balance-Leahy Scale: Poor                               Pertinent  Vitals/Pain Pain Assessment Pain Assessment: 0-10 Pain Score: 8  Pain Location: R shoulder Pain Descriptors / Indicators: Aching, Sore Pain Intervention(s): Repositioned, Monitored during session, Patient requesting pain meds-RN notified, RN gave pain meds during session    Home Living Family/patient expects to be discharged to:: Private residence Living Arrangements: Alone Available Help at Discharge: Family;Available PRN/intermittently Type of Home: Apartment Home Access: Level entry       Home Layout: One level Home Equipment: Rolling Walker (2 wheels);Grab bars - toilet;Grab bars - tub/shower;Shower seat - built in Additional Comments: Pt is a poor historian, history obtain from a combination of patient report and chart review    Prior Function Prior Level of Function : Needs assist             Mobility Comments: Mod Ind amb in the home with a RW, unable to varify fall frequency ADLs Comments: Pt reports Ind with ADLs but that son and DIL assist with making her breakfast in the morning and with bringing groceries     Extremity/Trunk Assessment   Upper Extremity Assessment Upper Extremity Assessment: Generalized weakness;RUE deficits/detail RUE: Unable to fully assess due to pain;Unable to fully assess due to immobilization    Lower Extremity Assessment Lower Extremity Assessment: Generalized weakness       Communication   Communication Communication: Difficulty following commands/understanding Following commands: Follows one step commands inconsistently Cueing Techniques: Verbal cues;Tactile cues;Visual cues  Cognition Arousal: Lethargic Behavior During Therapy: Flat affect Overall Cognitive Status: No family/caregiver present to determine baseline cognitive functioning                                 General Comments: Able to follow most 1-step commands with extra time and cuing, difficulty providing history        General Comments       Exercises Other Exercises Other Exercises: R lateral weight shifting activities in sitting to address L lateral lean/instability   Assessment/Plan    PT Assessment Patient needs continued PT services  PT Problem List Decreased strength;Decreased range of motion;Decreased activity tolerance;Decreased safety awareness;Decreased mobility;Decreased balance;Decreased knowledge of use of DME;Decreased knowledge of precautions;Pain       PT Treatment Interventions DME instruction;Gait training;Functional mobility training;Therapeutic activities;Therapeutic exercise;Balance training;Patient/family education    PT Goals (Current goals can be found in the Care Plan section)  Acute Rehab PT Goals PT Goal Formulation: Patient unable to participate in goal setting Time For Goal Achievement: 07/18/23 Potential to Achieve Goals: Fair    Frequency Min 1X/week     Co-evaluation               AM-PAC PT "6 Clicks" Mobility  Outcome Measure Help needed turning from your back to your side while in a flat bed without using bedrails?: A Lot Help needed moving from lying on your back to sitting on the side of a flat bed without using bedrails?: Total Help needed moving to and from a bed to a chair (including a wheelchair)?: Total Help needed standing up from a chair using your arms (e.g., wheelchair or bedside chair)?: Total Help needed to walk in hospital room?: Total Help needed climbing  3-5 steps with a railing? : Total 6 Click Score: 7    End of Session Equipment Utilized During Treatment: Gait belt Activity Tolerance: Patient tolerated treatment well Patient left: in bed;with nursing/sitter in room (Pt in ED hallway by nursing station) Nurse Communication: Mobility status;Weight bearing status PT Visit Diagnosis: Unsteadiness on feet (R26.81);History of falling (Z91.81);Difficulty in walking, not elsewhere classified (R26.2);Muscle weakness (generalized) (M62.81);Pain Pain - Right/Left:  Right Pain - part of body: Arm    Time: 0911-0929 PT Time Calculation (min) (ACUTE ONLY): 18 min   Charges:   PT Evaluation $PT Eval Moderate Complexity: 1 Mod   PT General Charges $$ ACUTE PT VISIT: 1 Visit       D. Elly Modena PT, DPT 07/05/23, 10:39 AM

## 2023-07-05 NOTE — Evaluation (Addendum)
Occupational Therapy Evaluation Patient Details Name: [name] MRN: [id number] DOB: 09/05/1927 Today's Date: 07/05/2023   History of Present Illness Pt is a 89 y.o. female with medical history significant of breast cancer, CVA, depression, HTN, COPD, chronic pain, and HLD who presented on 12/26 after an unwitnessed fall at home.  MD assessment includes: dizziness with fall with noted right proximal humerus fracture and subacute right distal radius fracture to be managed conservatively.  Of note, pt reports poor vision at baseline.   Clinical Impression   Chart reviewed, pt greeted in bed, alert, oriented to self and place. Pt is a poor historian, but endorses she lives alone and family assists with IADLs, amb with a RW around her house. She reports baseline vision deficits, reports "I know the way around my house so I do ok". Will need to confirm PLOF. Pt presents with deficits in RUE function, strength, endurance, activity tolerance, balance, cognition, affecting safe and optimal ADL completion. MAX A required for bed mobility, STS with MIN-MOD A +1-2 (improved with multiple attempts), step pivot transfer to bsc with MIN A +1-2. MAX A required for toileting after continent of urine in bedside commode. MAX A required for LB dressing, adjust sling and off the shelf splint. Pt endorses dizziness with position changes. Vitals are as follows:   BP in sitting: 133/73 (MAP 88) 72 bpm  BP in standing: 130.83 (MAP 93) 83 bpm  Spo2 >90% on RA throughout   Pt will benefit from skilled OT to address deficits and to facilitate optimal ADL performance. Pt is left in bed with pancake call button (unable to access regular call button), all needs met. Nurse informed of pt status. OT will continue to follow acutely.         If plan is discharge home, recommend the following: A little help with walking and/or transfers;A lot of help with bathing/dressing/bathroom;Assistance with cooking/housework;Direct  supervision/assist for medications management;Assist for transportation;Supervision due to cognitive status;Direct supervision/assist for financial management;Help with stairs or ramp for entrance    Functional Status Assessment  Patient has had a recent decline in their functional status and demonstrates the ability to make significant improvements in function in a reasonable and predictable amount of time.  Equipment Recommendations  Other (comment) (defer to next venue of care)    Recommendations for Other Services       Precautions / Restrictions Precautions Precautions: Fall Required Braces or Orthoses: Sling Restrictions Weight Bearing Restrictions Per Provider Order: Yes RUE Weight Bearing Per Provider Order: Non weight bearing Other Position/Activity Restrictions: Sling and wrist splint to RUE      Mobility Bed Mobility Overal bed mobility: Needs Assistance Bed Mobility: Supine to Sit, Sit to Supine     Supine to sit: Max assist, HOB elevated Sit to supine: Max assist, HOB elevated        Transfers Overall transfer level: Needs assistance Equipment used: 1 person hand held assist, 2 person hand held assist Transfers: Sit to/from Stand Sit to Stand: Min assist, Mod assist                  Balance Overall balance assessment: Needs assistance Sitting-balance support: Single extremity supported Sitting balance-Leahy Scale: Good     Standing balance support: Single extremity supported, During functional activity Standing balance-Leahy Scale: Fair                             ADL either performed or  assessed with clinical judgement   ADL Overall ADL's : Needs assistance/impaired                 Upper Body Dressing : Maximal assistance;Sitting Upper Body Dressing Details (indicate cue type and reason): sling, splint Lower Body Dressing: Maximal assistance;Sitting/lateral leans Lower Body Dressing Details (indicate cue type and reason):  socks Toilet Transfer: Minimal assistance;Moderate assistance;Cueing for safety;Cueing for sequencing Toilet Transfer Details (indicate cue type and reason): +1-2 step pivot with frequent multi modal cues Toileting- Clothing Manipulation and Hygiene: Maximal assistance;Sit to/from stand Toileting - Clothing Manipulation Details (indicate cue type and reason): continent of urine on toilet             Vision Patient Visual Report: No change from baseline;Other (comment) (pt reports vision deficits at baseline, unable to visualize call button, reports no changes in vision)       Perception         Praxis         Pertinent Vitals/Pain Pain Assessment Pain Assessment: 0-10 Pain Score: 7  Pain Location: R shoulder Pain Descriptors / Indicators: Aching, Sore Pain Intervention(s): Limited activity within patient's tolerance, Monitored during session, Patient requesting pain meds-RN notified     Extremity/Trunk Assessment Upper Extremity Assessment Upper Extremity Assessment: Generalized weakness;RUE deficits/detail RUE: Unable to fully assess due to immobilization   Lower Extremity Assessment Lower Extremity Assessment: Generalized weakness       Communication Communication Communication: Difficulty following commands/understanding;Difficulty communicating thoughts/reduced clarity of speech (pt is dysarthric) Following commands: Follows one step commands with increased time Cueing Techniques: Verbal cues;Tactile cues;Visual cues   Cognition Arousal: Alert Behavior During Therapy: Flat affect Overall Cognitive Status: No family/caregiver present to determine baseline cognitive functioning Area of Impairment: Orientation, Attention, Memory, Following commands, Safety/judgement, Awareness                 Orientation Level: Disoriented to, Situation, Date Current Attention Level: Sustained Memory: Decreased short-term memory Following Commands: Follows one step  commands with increased time Safety/Judgement: Decreased awareness of deficits Awareness: Emergent         General Comments       Exercises Other Exercises Other Exercises: edu re: role of OT, role of rehab, importance of progressing mobility, NWBing status   Shoulder Instructions      Home Living Family/patient expects to be discharged to:: Private residence Living Arrangements: Alone Available Help at Discharge: Family;Available PRN/intermittently Type of Home: Apartment Home Access: Level entry     Home Layout: One level     Bathroom Shower/Tub: Producer, television/film/video: Handicapped height     Home Equipment: Agricultural consultant (2 wheels);Grab bars - toilet;Grab bars - tub/shower;Shower seat - built in   Additional Comments: pt is a poor historian, info from chart, pt does endorse she lives alone, daughter in law comes to cook breakfast per her report      Prior Functioning/Environment Prior Level of Function : Needs assist;History of Falls (last six months)             Mobility Comments: amb with RW per pt report household distances ADLs Comments: pt reports she performs dressing, grooming, feeding with MOD I, unable to gather bathing information;endorses assist from family with IADL; will need to confirm        OT Problem List: Decreased strength;Decreased activity tolerance;Decreased knowledge of use of DME or AE;Decreased safety awareness;Impaired balance (sitting and/or standing);Decreased cognition      OT Treatment/Interventions: Self-care/ADL training;Therapeutic exercise;Patient/family  education;Balance training;Energy conservation;Therapeutic activities;DME and/or AE instruction;Cognitive remediation/compensation    OT Goals(Current goals can be found in the care plan section) Acute Rehab OT Goals Patient Stated Goal: get stronger OT Goal Formulation: With patient Time For Goal Achievement: 07/19/23 Potential to Achieve Goals: Good ADL  Goals Pt Will Perform Grooming: with supervision;sitting Pt Will Perform Lower Body Dressing: with supervision;sitting/lateral leans Pt Will Transfer to Toilet: with supervision;ambulating Pt Will Perform Toileting - Clothing Manipulation and hygiene: with supervision;sitting/lateral leans  OT Frequency: Min 1X/week    Co-evaluation              AM-PAC OT "6 Clicks" Daily Activity     Outcome Measure Help from another person eating meals?: A Little Help from another person taking care of personal grooming?: A Lot Help from another person toileting, which includes using toliet, bedpan, or urinal?: A Lot Help from another person bathing (including washing, rinsing, drying)?: A Lot Help from another person to put on and taking off regular upper body clothing?: A Lot Help from another person to put on and taking off regular lower body clothing?: A Lot 6 Click Score: 13   End of Session Equipment Utilized During Treatment: Gait belt;Other (comment) (sling, wrist splint) Nurse Communication: Mobility status  Activity Tolerance: Patient tolerated treatment well Patient left: in bed;with call bell/phone within reach;with bed alarm set  OT Visit Diagnosis: Other abnormalities of gait and mobility (R26.89);Unsteadiness on feet (R26.81)                Time: 5638-7564 OT Time Calculation (min): 20 min Charges:  OT General Charges $OT Visit: 1 Visit OT Evaluation $OT Eval Moderate Complexity: 1 Mod  Oleta Mouse, OTD OTR/L  07/05/23, 12:57 PM

## 2023-07-05 NOTE — Assessment & Plan Note (Signed)
History of chronic pain.  [place] controlled substance reporting system was reviewed by admitting provider. Might be causing dizziness resulted in fall. Avoid escalating opioid therapy -Continue home meds

## 2023-07-05 NOTE — Assessment & Plan Note (Signed)
Hemoglobin at 7.3, decreased from 8.5 on admission.  No obvious bleeding, anemia panel with iron deficiency, B12 levels pending. -Started on supplement -Continue to monitor

## 2023-07-05 NOTE — Assessment & Plan Note (Signed)
Noted on imaging  No obvious obstruction at this time

## 2023-07-05 NOTE — Assessment & Plan Note (Signed)
Patient was complaining of dysuria.  No leukocytosis or fever -UA and urine cultures ordered.

## 2023-07-05 NOTE — Assessment & Plan Note (Signed)
Estimated body mass index is 15.06 kg/m as calculated from the following:   Height as of this encounter: 5\' 3"  (1.6 m).   Weight as of this encounter: 38.6 kg.   -Dietitian consult

## 2023-07-05 NOTE — NC FL2 (Signed)
MEDICAID FL2 LEVEL OF CARE FORM     IDENTIFICATION  Patient Name: [name] Birthdate: 11-18-1927 Sex: female Admission Date (Current Location): 07/04/2023  [facility] and [place] Number:  Chiropodist and Address:  [facility], [address] [place], [place] [address]      Provider Number: [id number]  Attending Physician Name and Address:  Arnetha Courser, MD  Relative Name and Phone Number:  Sharron Flory  Son  Emergency Contact  [phone]  [facility] Loney Laurence  Son  Emergency Contact  [phone]  [facility])    Current Level of Care: Hospital Recommended Level of Care: Skilled Nursing Facility Prior Approval Number:    Date Approved/Denied:   PASRR Number: [id number]  Discharge Plan: SNF    Current Diagnoses: Patient Active Problem List   Diagnosis Date Noted   Closed fracture of right distal radius 07/05/2023   Fall at home, initial encounter 07/04/2023   Staghorn calculus 07/04/2023   DNR (do not resuscitate) 07/04/2023   Malnutrition of moderate degree (HCC) 11/20/2021   Anxiety disorder 10/02/2021   Chronic diastolic CHF (congestive heart failure) (HCC) 10/02/2021   SVT (supraventricular tachycardia) (HCC) 10/02/2021   Benzodiazepine dependence (HCC) 04/17/2018   PAD (peripheral artery disease) (HCC) 07/22/2017   Narcotic dependence (HCC) 01/03/2017   Proximal humerus fracture 12/20/2015   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   Postherpetic neuralgia 12/15/2015   Advance directive discussed with patient 10/27/2015   Allergic rhinitis due to pollen 05/20/2015   Routine general medical examination at a health care facility 02/22/2014   Hypertension    B12 deficiency 05/03/2009   Neuropathy (HCC) 04/04/2009   Episodic mood disorder (HCC) 06/16/2008   Unspecified glaucoma 11/14/2006   Disturbance in sleep behavior 11/14/2006   Dyslipidemia 08/28/2006   GERD 08/28/2006    Osteoarthritis 08/28/2006   BREAST CANCER, HX OF 08/28/2006    Orientation RESPIRATION BLADDER Height & Weight     Self, Time, Situation, Place  Normal Incontinent Weight: 38.6 kg Height:  5\' 3"  (160 cm)  BEHAVIORAL SYMPTOMS/MOOD NEUROLOGICAL BOWEL NUTRITION STATUS      Continent Diet (see dc summary)  AMBULATORY STATUS COMMUNICATION OF NEEDS Skin   Extensive Assist Verbally Normal                       Personal Care Assistance Level of Assistance  Bathing, Feeding, Dressing Bathing Assistance: Limited assistance Feeding assistance: Limited assistance Dressing Assistance: Maximum assistance     Functional Limitations Info  Sight, Hearing, Speech Sight Info: Adequate Hearing Info: Adequate Speech Info: Adequate    SPECIAL CARE FACTORS FREQUENCY  PT (By licensed PT), OT (By licensed OT)     PT Frequency: 5 times per week OT Frequency: 5 times per week            Contractures Contractures Info: Not present    Additional Factors Info  Code Status, Allergies Code Status Info: DNR Allergies Info: Duloxetine, Cymbalta (Duloxetine Hcl), Maxitrol (Neomycin-polymyxin-dexameth), Pregabalin, Tobradex (Tobramycin-dexamethasone)           Current Medications (07/05/2023):  This is the current hospital active medication list Current Facility-Administered Medications  Medication Dose Route Frequency Provider Last Rate Last Admin   acetaminophen (TYLENOL) tablet 650 mg  650 mg Oral Q6H PRN Jonah Blue, MD   650 mg at 07/05/23 0445   Or   acetaminophen (TYLENOL) suppository 650 mg  650 mg Rectal Q6H PRN Jonah Blue, MD  acyclovir (ZOVIRAX) 200 MG capsule 200 mg  200 mg Oral BID Jonah Blue, MD   200 mg at 07/05/23 1032   albuterol (PROVENTIL) (2.5 MG/3ML) 0.083% nebulizer solution 2.5 mg  2.5 mg Nebulization Q2H PRN Jonah Blue, MD       amLODipine (NORVASC) tablet 5 mg  5 mg Oral Daily Jonah Blue, MD   5 mg at 07/05/23 1032   azelastine  (ASTELIN) 0.1 % nasal spray 2 spray  2 spray Each Nare BID Jonah Blue, MD       bisacodyl (DULCOLAX) EC tablet 5 mg  5 mg Oral Daily PRN Jonah Blue, MD       cycloSPORINE (RESTASIS) 0.05 % ophthalmic emulsion 1 drop  1 drop Both Eyes BID Jonah Blue, MD   1 drop at 07/05/23 1033   docusate sodium (COLACE) capsule 100 mg  100 mg Oral BID Jonah Blue, MD   100 mg at 07/05/23 1032   enoxaparin (LOVENOX) injection 30 mg  30 mg Subcutaneous Q24H Foye Deer, RPH   30 mg at 07/04/23 2148   Fe Fum-Vit C-Vit B12-FA (TRIGELS-F FORTE) capsule 1 capsule  1 capsule Oral BID Arnetha Courser, MD       fluticasone (FLONASE) 50 MCG/ACT nasal spray 2 spray  2 spray Each Nare Daily Jonah Blue, MD   2 spray at 07/05/23 1033   hydrALAZINE (APRESOLINE) injection 5 mg  5 mg Intravenous Q4H PRN Jonah Blue, MD       loratadine (CLARITIN) tablet 10 mg  10 mg Oral Daily Jonah Blue, MD   10 mg at 07/05/23 1032   LORazepam (ATIVAN) tablet 0.5 mg  0.5 mg Oral Q6H PRN Jonah Blue, MD   0.5 mg at 07/05/23 0426   meclizine (ANTIVERT) tablet 25 mg  25 mg Oral TID PRN Jonah Blue, MD       morphine (MSIR) tablet 15 mg  15 mg Oral TID PRN Jonah Blue, MD   15 mg at 07/05/23 0445   ondansetron (ZOFRAN) tablet 4 mg  4 mg Oral Q6H PRN Jonah Blue, MD   4 mg at 07/05/23 0446   Or   ondansetron (ZOFRAN) injection 4 mg  4 mg Intravenous Q6H PRN Jonah Blue, MD       pantoprazole (PROTONIX) EC tablet 40 mg  40 mg Oral Daily Jonah Blue, MD   40 mg at 07/05/23 1031   polyethylene glycol (MIRALAX / GLYCOLAX) packet 17 g  17 g Oral QHS Jonah Blue, MD       polyethylene glycol (MIRALAX / GLYCOLAX) packet 17 g  17 g Oral Daily PRN Jonah Blue, MD       topiramate (TOPAMAX) tablet 50 mg  50 mg Oral Noemi Chapel, MD   50 mg at 07/04/23 2305     Discharge Medications: Please see discharge summary for a list of discharge medications.  Relevant Imaging  Results:  Relevant Lab Results:   Additional Information ss [phone]  Zamari Vea Seward Carol, RN

## 2023-07-05 NOTE — ED Notes (Signed)
Report given to Tiffany, RN. 

## 2023-07-05 NOTE — Assessment & Plan Note (Signed)
S/p remote B mastectomies (1986) with reconstruction

## 2023-07-06 DIAGNOSIS — N200 Calculus of kidney: Secondary | ICD-10-CM | POA: Diagnosis not present

## 2023-07-06 DIAGNOSIS — S42201A Unspecified fracture of upper end of right humerus, initial encounter for closed fracture: Secondary | ICD-10-CM | POA: Diagnosis not present

## 2023-07-06 DIAGNOSIS — F1120 Opioid dependence, uncomplicated: Secondary | ICD-10-CM | POA: Diagnosis not present

## 2023-07-06 DIAGNOSIS — H409 Unspecified glaucoma: Secondary | ICD-10-CM | POA: Diagnosis not present

## 2023-07-06 DIAGNOSIS — Z9181 History of falling: Secondary | ICD-10-CM | POA: Diagnosis not present

## 2023-07-06 DIAGNOSIS — Z87891 Personal history of nicotine dependence: Secondary | ICD-10-CM | POA: Diagnosis not present

## 2023-07-06 DIAGNOSIS — K2100 Gastro-esophageal reflux disease with esophagitis, without bleeding: Secondary | ICD-10-CM | POA: Diagnosis not present

## 2023-07-06 DIAGNOSIS — F419 Anxiety disorder, unspecified: Secondary | ICD-10-CM | POA: Diagnosis not present

## 2023-07-06 DIAGNOSIS — K219 Gastro-esophageal reflux disease without esophagitis: Secondary | ICD-10-CM | POA: Diagnosis not present

## 2023-07-06 DIAGNOSIS — R300 Dysuria: Secondary | ICD-10-CM | POA: Diagnosis not present

## 2023-07-06 DIAGNOSIS — Y92009 Unspecified place in unspecified non-institutional (private) residence as the place of occurrence of the external cause: Secondary | ICD-10-CM | POA: Diagnosis not present

## 2023-07-06 DIAGNOSIS — E43 Unspecified severe protein-calorie malnutrition: Secondary | ICD-10-CM | POA: Diagnosis not present

## 2023-07-06 DIAGNOSIS — I739 Peripheral vascular disease, unspecified: Secondary | ICD-10-CM | POA: Diagnosis not present

## 2023-07-06 DIAGNOSIS — M6281 Muscle weakness (generalized): Secondary | ICD-10-CM | POA: Diagnosis not present

## 2023-07-06 DIAGNOSIS — Z85828 Personal history of other malignant neoplasm of skin: Secondary | ICD-10-CM | POA: Diagnosis not present

## 2023-07-06 DIAGNOSIS — I10 Essential (primary) hypertension: Secondary | ICD-10-CM | POA: Diagnosis not present

## 2023-07-06 DIAGNOSIS — E785 Hyperlipidemia, unspecified: Secondary | ICD-10-CM | POA: Diagnosis not present

## 2023-07-06 DIAGNOSIS — D649 Anemia, unspecified: Secondary | ICD-10-CM | POA: Diagnosis not present

## 2023-07-06 DIAGNOSIS — J449 Chronic obstructive pulmonary disease, unspecified: Secondary | ICD-10-CM | POA: Diagnosis not present

## 2023-07-06 DIAGNOSIS — Z1629 Resistance to other single specified antibiotic: Secondary | ICD-10-CM | POA: Diagnosis not present

## 2023-07-06 DIAGNOSIS — Z8673 Personal history of transient ischemic attack (TIA), and cerebral infarction without residual deficits: Secondary | ICD-10-CM | POA: Diagnosis not present

## 2023-07-06 DIAGNOSIS — B370 Candidal stomatitis: Secondary | ICD-10-CM | POA: Diagnosis not present

## 2023-07-06 DIAGNOSIS — S52501D Unspecified fracture of the lower end of right radius, subsequent encounter for closed fracture with routine healing: Secondary | ICD-10-CM | POA: Diagnosis not present

## 2023-07-06 DIAGNOSIS — R1312 Dysphagia, oropharyngeal phase: Secondary | ICD-10-CM | POA: Diagnosis not present

## 2023-07-06 DIAGNOSIS — Z681 Body mass index (BMI) 19 or less, adult: Secondary | ICD-10-CM | POA: Diagnosis not present

## 2023-07-06 DIAGNOSIS — R64 Cachexia: Secondary | ICD-10-CM | POA: Diagnosis not present

## 2023-07-06 DIAGNOSIS — M4854XA Collapsed vertebra, not elsewhere classified, thoracic region, initial encounter for fracture: Secondary | ICD-10-CM | POA: Diagnosis not present

## 2023-07-06 DIAGNOSIS — Z743 Need for continuous supervision: Secondary | ICD-10-CM | POA: Diagnosis not present

## 2023-07-06 DIAGNOSIS — D509 Iron deficiency anemia, unspecified: Secondary | ICD-10-CM

## 2023-07-06 DIAGNOSIS — W19XXXA Unspecified fall, initial encounter: Secondary | ICD-10-CM | POA: Diagnosis not present

## 2023-07-06 DIAGNOSIS — S52531A Colles' fracture of right radius, initial encounter for closed fracture: Secondary | ICD-10-CM

## 2023-07-06 DIAGNOSIS — R404 Transient alteration of awareness: Secondary | ICD-10-CM | POA: Diagnosis not present

## 2023-07-06 DIAGNOSIS — J309 Allergic rhinitis, unspecified: Secondary | ICD-10-CM | POA: Diagnosis not present

## 2023-07-06 DIAGNOSIS — G8929 Other chronic pain: Secondary | ICD-10-CM | POA: Diagnosis not present

## 2023-07-06 DIAGNOSIS — B029 Zoster without complications: Secondary | ICD-10-CM | POA: Diagnosis not present

## 2023-07-06 DIAGNOSIS — F32A Depression, unspecified: Secondary | ICD-10-CM | POA: Diagnosis not present

## 2023-07-06 DIAGNOSIS — S42201D Unspecified fracture of upper end of right humerus, subsequent encounter for fracture with routine healing: Secondary | ICD-10-CM | POA: Diagnosis not present

## 2023-07-06 DIAGNOSIS — Z853 Personal history of malignant neoplasm of breast: Secondary | ICD-10-CM | POA: Diagnosis not present

## 2023-07-06 DIAGNOSIS — I5032 Chronic diastolic (congestive) heart failure: Secondary | ICD-10-CM | POA: Diagnosis not present

## 2023-07-06 DIAGNOSIS — K5900 Constipation, unspecified: Secondary | ICD-10-CM | POA: Diagnosis not present

## 2023-07-06 DIAGNOSIS — Z66 Do not resuscitate: Secondary | ICD-10-CM | POA: Diagnosis not present

## 2023-07-06 DIAGNOSIS — N390 Urinary tract infection, site not specified: Secondary | ICD-10-CM | POA: Diagnosis not present

## 2023-07-06 DIAGNOSIS — B0229 Other postherpetic nervous system involvement: Secondary | ICD-10-CM | POA: Diagnosis not present

## 2023-07-06 DIAGNOSIS — G629 Polyneuropathy, unspecified: Secondary | ICD-10-CM | POA: Diagnosis not present

## 2023-07-06 LAB — CBC
HCT: 22.7 % — ABNORMAL LOW (ref 36.0–46.0)
Hemoglobin: 6.8 g/dL — ABNORMAL LOW (ref 12.0–15.0)
MCH: 21.9 pg — ABNORMAL LOW (ref 26.0–34.0)
MCHC: 30 g/dL (ref 30.0–36.0)
MCV: 73.2 fL — ABNORMAL LOW (ref 80.0–100.0)
Platelets: 182 10*3/uL (ref 150–400)
RBC: 3.1 MIL/uL — ABNORMAL LOW (ref 3.87–5.11)
RDW: 18.1 % — ABNORMAL HIGH (ref 11.5–15.5)
WBC: 9.1 10*3/uL (ref 4.0–10.5)
nRBC: 0 % (ref 0.0–0.2)

## 2023-07-06 LAB — BASIC METABOLIC PANEL
Anion gap: 6 (ref 5–15)
BUN: 27 mg/dL — ABNORMAL HIGH (ref 8–23)
CO2: 23 mmol/L (ref 22–32)
Calcium: 7.9 mg/dL — ABNORMAL LOW (ref 8.9–10.3)
Chloride: 111 mmol/L (ref 98–111)
Creatinine, Ser: 0.92 mg/dL (ref 0.44–1.00)
GFR, Estimated: 58 mL/min — ABNORMAL LOW (ref >60)
Glucose, Bld: 92 mg/dL (ref 70–99)
Potassium: 4.5 mmol/L (ref 3.5–5.1)
Sodium: 140 mmol/L (ref 135–145)

## 2023-07-06 LAB — HEMOGLOBIN AND HEMATOCRIT, BLOOD
HCT: 28.7 % — ABNORMAL LOW (ref 36.0–46.0)
Hemoglobin: 8.9 g/dL — ABNORMAL LOW (ref 12.0–15.0)

## 2023-07-06 LAB — PREPARE RBC (CROSSMATCH)

## 2023-07-06 MED ORDER — SODIUM CHLORIDE 0.9 % IV SOLN
2.0000 g | INTRAVENOUS | Status: DC
  Administered 2023-07-06 – 2023-07-10 (×5): 2 g via INTRAVENOUS
  Filled 2023-07-06 (×5): qty 20

## 2023-07-06 MED ORDER — CYANOCOBALAMIN 1000 MCG/ML IJ SOLN
1000.0000 ug | Freq: Every day | INTRAMUSCULAR | Status: AC
  Administered 2023-07-06 – 2023-07-08 (×3): 1000 ug via INTRAMUSCULAR
  Filled 2023-07-06 (×3): qty 1

## 2023-07-06 MED ORDER — ACETAMINOPHEN 650 MG RE SUPP: 650.0000 mg | Freq: Four times a day (QID) | RECTAL | Status: DC | PRN

## 2023-07-06 MED ORDER — ACETAMINOPHEN 325 MG PO TABS
650.0000 mg | ORAL_TABLET | Freq: Four times a day (QID) | ORAL | Status: DC | PRN
  Administered 2023-07-06 – 2023-07-08 (×2): 650 mg via ORAL
  Filled 2023-07-06: qty 2

## 2023-07-06 MED ORDER — SODIUM CHLORIDE 0.9% IV SOLUTION: Freq: Once | INTRAVENOUS | Status: DC

## 2023-07-06 NOTE — Progress Notes (Signed)
Transition of Care [facility]) - Inpatient Brief Assessment   Patient Details  Name: [name] MRN: [id number] Date of Birth: 1927/12/03  [facility] of [facility]) CM/SW Contact:    Bing Quarry, RN Phone Number: 07/06/2023, 10:10 AM   Clinical Narrative: 07/06/23:  On 07/04/23:  Patient was admitted from home where she lived alone in one level entry apartment with use of RW, to [facility] ED d/t a fall with dx of right proximal humerus fracture & subacute right distal radius fracture to be managed conservatively per clinical notes.   PMH significant in clinical notes for Htx of breast cancer, CVA, depression, HTN, COPD, chronic pain, and HLD. Patient has impaired vision. High Fall risk.   Per PT notes on 07/05/23 patient was a bit confused in providing history and only able to follow 1-step instructions and that was with extra time and cuing. RUE pain, with high risk for future falls.   RN CM spoke with son, [name], this am regarding MOON notification and was given verbal permission to e-sign notification, which a hard copy was provided to patient's room and via MyChart.   Son, did relay that per provider request he had contacted [employer] insurance and patient does have LTC insurance. Per son, he had not yet had the Medicare choice list made available to him and patient reported to be alert but confused and a poor historian per PT notes.   A broad bed search was initiated yesterday by prior RN CM. Per Ilda Foil patient was at [facility] in May 2023 and had [facility] via [facility] also in June 2023 post discharge from [facility], it apprears.   TOC to follow through final disposition.   Per DPR: DPR SIGNED 06-17-14.APPOINTED Durward Mallard Mani [phone]) & PAMELA A Straw (DAUGHTER-IN-LAW/[phone])  MAY L/M DETAILED AT THEIR #/MTH   Gabriel Cirri MSN RN CM  Care Management Department.  [place]  [facility] Direct Dial: [phone] Main Office Phone: [phone] Weekends Only      Transition  of Care Asessment: Insurance and Status: Insurance coverage has been reviewed Patient has primary care physician: Yes Home environment has been reviewed: Lives alone with use of a walker. Level apartment with level entry.;Grab bars - toilet;Grab bars, tub/shower;Shower seat: built in (Per PT evaluation notes). Prior level of function:: Living Arrangements: Lives alone with use of a walker. Has RW at home. Prior/Current Home Services: No current home services Social Drivers of Health Review: SDOH reviewed no interventions necessary Readmission risk has been reviewed: Yes Transition of care needs: transition of care needs identified, TOC will continue to follow (Per Ping patient was at [facility] in May 2023 and had HH via [facility] also in June 2023 post discharge from STR it apprears.)

## 2023-07-06 NOTE — Assessment & Plan Note (Signed)
Estimated body mass index is 15.78 kg/m as calculated from the following:   Height as of this encounter: 5' 2.99" (1.6 m).   Weight as of this encounter: 40.4 kg.   -Dietitian consult

## 2023-07-06 NOTE — TOC Progression Note (Addendum)
Transition of Care [facility]) - Progression Note    Patient Details  Name: [name] MRN: [id number] Date of Birth: [birth year]  [facility]) CM/SW Contact  Elizabeth Quarry, RN Phone Number: 07/06/2023, 1:19 PM  Clinical Narrative:   07/06/23: Patient was noted to be confused yesterday and today in clinical assessments/notes, requiring a good amount of verbal cuing per PT notes during session just to walk a few steps and was a poor historian, therefore patient representative became son, [name], for Medicare Observation notification letter.    MOON Documentation clarification: A MOON notification was explained to son, [name], as patient representative, with verbal permission obtained to e-sign the notification that form was explained per Medicare regulations of possible claim adjustment due to OBS status.  A copy was provided to patient's room and sent via MyChart from [facility] EMR. Representative, son, declined a hard copy being sent to him, and would retrieve from copy sent to patient' room or from MyChart.   Elizabeth Cirri MSN RN CM  Transitions of Care: Care Management Department.  [place]  [facility] Direct Dial: [phone] (Weekends Only) [facility] Main Office Phone: [phone] [facility] Fax: [phone]            Expected Discharge Plan and Services                                               Social Determinants of Health (SDOH) Interventions SDOH Screenings   Food Insecurity: Patient Unable To Answer (07/05/2023)  Housing: Patient Declined (07/05/2023)  Transportation Needs: Patient Unable To Answer (07/05/2023)  Utilities: Patient Unable To Answer (07/05/2023)  Depression (PHQ2-9): Low Risk  (10/29/2022)  Tobacco Use: Medium Risk (07/04/2023)    Readmission Risk Interventions     No data to display

## 2023-07-06 NOTE — Assessment & Plan Note (Signed)
Patient was complaining of dysuria.  No leukocytosis or fever -UA with large leukocytes and many bacteria, urine cultures pending. -Started on ceftriaxone

## 2023-07-06 NOTE — Progress Notes (Signed)
Progress Note   Patient: [name] ION:[id number] DOB: 02/14/1928 DOA: 07/04/2023     0 DOS: the patient was seen and examined on 07/06/2023   Brief hospital course: Taken from H&P.  [name] is a 89 y.o. female with medical history significant of breast cancer, CVA, depression, HTN, and HLD who presented on 12/26 with a fall.  She reports that she lives alone and uses a walker to get around.  She went to the bathroom and isn't sure what happened but she fell and hurt her arm.  No other injuries.   She was found to have right proximal humeral fracture and also seen a distal radial fracture which seems subacute but patient was unable to explain further injuries.  Orthopedic saw the patient this morning and recommending conservative management and her distal radius fracture looks like subacute to chronic.  They are recommending pain control, immobilization with sling and outpatient follow-up in 2 weeks.  Labs with worsening hemoglobin at 7.3 today, anemia panel consistent with iron deficiency, pending B12 levels.  Checking UA urine culture as she was complaining of dysuria.  PT is recommending SNF  Assessment and Plan: * Fall at home, initial encounter History of intermittent dizziness. Resulted in right proximal humerus fracture and a subacute distal radius fracture.  T1 compression fracture. Orthopedic was consulted and they are recommending conservative management with immobilization of both shoulder and wrist.  They will follow-up as outpatient in 2 weeks -PT and OT are recommending SNF -Continue with pain management and supportive care -[facility] consult for placement  Dysuria Patient was complaining of dysuria.  No leukocytosis or fever -UA with large leukocytes and many bacteria, urine cultures pending. -Started on ceftriaxone  Hypertension -Continue home amlodipine  Narcotic dependence (HCC) History of chronic pain.  [place] controlled substance reporting system was reviewed by  admitting provider. Might be causing dizziness resulted in fall. Avoid escalating opioid therapy -Continue home meds  Microcytic anemia Further decrease of hemoglobin to 6.8, no obvious bleeding, anemia panel with iron and B12 deficiency,  -Ordered 1 unit of PRBC -Started on supplement -Continue to monitor  Staghorn calculus Noted on imaging  No obvious obstruction at this time  BREAST CANCER, HX OF S/p remote B mastectomies (1986) with reconstruction   Protein-calorie malnutrition, severe Estimated body mass index is 15.78 kg/m as calculated from the following:   Height as of this encounter: 5' 2.99" (1.6 m).   Weight as of this encounter: 40.4 kg.   -Dietitian consult      Subjective: Patient was seen and examined today.  Pain seems well-controlled.  She was getting blood transfusion.  Physical Exam: Vitals:   07/06/23 1119 07/06/23 1120 07/06/23 1137 07/06/23 1341  BP: (!) 97/38 (!) 97/38 (!) 112/46 122/62  Pulse:  72 69 73  Resp: 16 16 16 16   Temp: 98.7 F (37.1 C) 98.7 F (37.1 C) 98.4 F (36.9 C) 97.7 F (36.5 C)  TempSrc: Oral Oral Oral Oral  SpO2:  93%  92%  Weight:      Height:       General.  Very frail and cachectic elderly lady, in no acute distress. Pulmonary.  Lungs clear bilaterally, normal respiratory effort. CV.  Regular rate and rhythm, no JVD, rub or murmur. Abdomen.  Soft, nontender, nondistended, BS positive. CNS.  Alert and oriented .  No focal neurologic deficit. Extremities.  No edema, no cyanosis, pulses intact and symmetrical.  RUE in sling   Data Reviewed: Prior data  reviewed  Family Communication: Talked with son on phone.  Disposition: Status is: Inpatient  inpatient because: Severity of illness  Planned Discharge Destination: Skilled nursing facility  DVT prophylaxis.  Lovenox Time spent: 44 minutes  This record has been created using Conservation officer, historic buildings. Errors have been sought and corrected,but may not  always be located. Such creation errors do not reflect on the standard of care.   Author: Arnetha Courser, MD 07/06/2023 2:25 PM  For on call review [email or web address].

## 2023-07-06 NOTE — Assessment & Plan Note (Signed)
Hemoglobin improved to 8.9 after getting 1 unit of blood yesterday, no obvious bleeding, anemia panel with iron and B12 deficiency,  -Started on supplement -Continue to monitor

## 2023-07-06 NOTE — Plan of Care (Addendum)
Patient alert and oriented to self and place at times situation. Pt given ativan at bedtime and tylenol for pain. BP soft no morphine given. Purewick placed during overnight per order.  Problem: Education: Goal: Knowledge of General Education information will improve Description: Including pain rating scale, medication(s)/side effects and non-pharmacologic comfort measures Outcome: Progressing   Problem: Health Behavior/Discharge Planning: Goal: Ability to manage health-related needs will improve Outcome: Progressing   Problem: Clinical Measurements: Goal: Ability to maintain clinical measurements within normal limits will improve Outcome: Progressing Goal: Will remain free from infection Outcome: Progressing Goal: Diagnostic test results will improve Outcome: Progressing Goal: Respiratory complications will improve Outcome: Progressing Goal: Cardiovascular complication will be avoided Outcome: Progressing   Problem: Activity: Goal: Risk for activity intolerance will decrease Outcome: Progressing   Problem: Nutrition: Goal: Adequate nutrition will be maintained Outcome: Progressing   Problem: Coping: Goal: Level of anxiety will decrease Outcome: Progressing   Problem: Elimination: Goal: Will not experience complications related to bowel motility Outcome: Progressing Goal: Will not experience complications related to urinary retention Outcome: Progressing   Problem: Pain Management: Goal: General experience of comfort will improve Outcome: Progressing   Problem: Safety: Goal: Ability to remain free from injury will improve Outcome: Progressing   Problem: Skin Integrity: Goal: Risk for impaired skin integrity will decrease Outcome: Progressing

## 2023-07-06 NOTE — Care Management Obs Status (Addendum)
MEDICARE OBSERVATION STATUS NOTIFICATION   Patient Details  Name: [name] MRN: [id number] Date of Birth: [birth year]   Medicare Observation Status Notification Given:  Yes  Patient was noted to be confused yesterday and today in clinical assessments/notes, requiring a good amount of verbal cuing per PT notes during session just to walk a few steps and was a poor historian, therefore patient representative became son, [name].  MOON notification was explained to son, [name], with verbal permission to sign notification that form was explained per Medicare regulations of possible claim adjustment.  A copy was provided to patient's room and via MyChart. Representative, son, declined a hard copy being sent to him, or would retrieve from copy sent to patient' room or from MyChart.   Bing Quarry, RN 07/06/2023, 9:55 AM

## 2023-07-07 DIAGNOSIS — R300 Dysuria: Secondary | ICD-10-CM | POA: Diagnosis not present

## 2023-07-07 DIAGNOSIS — S42201A Unspecified fracture of upper end of right humerus, initial encounter for closed fracture: Secondary | ICD-10-CM | POA: Diagnosis not present

## 2023-07-07 DIAGNOSIS — S52531A Colles' fracture of right radius, initial encounter for closed fracture: Secondary | ICD-10-CM | POA: Diagnosis not present

## 2023-07-07 DIAGNOSIS — W19XXXA Unspecified fall, initial encounter: Secondary | ICD-10-CM | POA: Diagnosis not present

## 2023-07-07 LAB — TYPE AND SCREEN
ABO/RH(D): A POS
Antibody Screen: NEGATIVE
Unit division: 0

## 2023-07-07 LAB — BPAM RBC
Blood Product Expiration Date: 202501282359
ISSUE DATE / TIME: 202412281115
Unit Type and Rh: 6200

## 2023-07-07 MED ORDER — LACTATED RINGERS IV SOLN: INTRAVENOUS | Status: AC

## 2023-07-07 MED ORDER — ORAL CARE MOUTH RINSE
15.0000 mL | OROMUCOSAL | Status: DC
  Administered 2023-07-07 – 2023-07-11 (×15): 15 mL via OROMUCOSAL

## 2023-07-07 MED ORDER — ORAL CARE MOUTH RINSE: 15.0000 mL | OROMUCOSAL | Status: DC | PRN

## 2023-07-07 MED ORDER — MAGIC MOUTHWASH W/LIDOCAINE
5.0000 mL | Freq: Once | ORAL | Status: AC
  Administered 2023-07-07: 5 mL via ORAL
  Filled 2023-07-07: qty 5

## 2023-07-07 NOTE — Plan of Care (Signed)
°  Problem: Education: Goal: Knowledge of General Education information will improve Description: Including pain rating scale, medication(s)/side effects and non-pharmacologic comfort measures 07/07/2023 1649 by Lidia Collum, RN Outcome: Progressing 07/07/2023 1456 by Lidia Collum, RN Outcome: Progressing   Problem: Health Behavior/Discharge Planning: Goal: Ability to manage health-related needs will improve 07/07/2023 1649 by Lidia Collum, RN Outcome: Progressing 07/07/2023 1456 by Lidia Collum, RN Outcome: Progressing   Problem: Clinical Measurements: Goal: Ability to maintain clinical measurements within normal limits will improve 07/07/2023 1649 by Lidia Collum, RN Outcome: Progressing 07/07/2023 1456 by Pia Mau D, RN Outcome: Progressing Goal: Will remain free from infection 07/07/2023 1649 by Lidia Collum, RN Outcome: Progressing 07/07/2023 1456 by Pia Mau D, RN Outcome: Progressing Goal: Diagnostic test results will improve 07/07/2023 1649 by Lidia Collum, RN Outcome: Progressing 07/07/2023 1456 by Pia Mau D, RN Outcome: Progressing Goal: Respiratory complications will improve 07/07/2023 1649 by Lidia Collum, RN Outcome: Progressing 07/07/2023 1456 by Pia Mau D, RN Outcome: Progressing Goal: Cardiovascular complication will be avoided 07/07/2023 1649 by Lidia Collum, RN Outcome: Progressing 07/07/2023 1456 by Lidia Collum, RN Outcome: Progressing   Problem: Activity: Goal: Risk for activity intolerance will decrease 07/07/2023 1649 by Lidia Collum, RN Outcome: Progressing 07/07/2023 1456 by Pia Mau D, RN Outcome: Progressing   Problem: Nutrition: Goal: Adequate nutrition will be maintained 07/07/2023 1649 by Lidia Collum, RN Outcome: Progressing 07/07/2023 1456 by Pia Mau D, RN Outcome: Progressing   Problem: Coping: Goal: Level of anxiety will  decrease 07/07/2023 1649 by Lidia Collum, RN Outcome: Progressing 07/07/2023 1456 by Pia Mau D, RN Outcome: Progressing   Problem: Elimination: Goal: Will not experience complications related to bowel motility 07/07/2023 1649 by Lidia Collum, RN Outcome: Progressing 07/07/2023 1456 by Lidia Collum, RN Outcome: Not Progressing Goal: Will not experience complications related to urinary retention Outcome: Progressing   Problem: Pain Management: Goal: General experience of comfort will improve 07/07/2023 1649 by Lidia Collum, RN Outcome: Progressing 07/07/2023 1456 by Pia Mau D, RN Outcome: Progressing   Problem: Safety: Goal: Ability to remain free from injury will improve 07/07/2023 1649 by Lidia Collum, RN Outcome: Progressing 07/07/2023 1456 by Pia Mau D, RN Outcome: Progressing   Problem: Skin Integrity: Goal: Risk for impaired skin integrity will decrease 07/07/2023 1649 by Lidia Collum, RN Outcome: Progressing 07/07/2023 1456 by Lidia Collum, RN Outcome: Progressing  Lidia Collum, RN

## 2023-07-07 NOTE — Plan of Care (Signed)

## 2023-07-07 NOTE — Plan of Care (Signed)
Patient ID: [name], female   DOB: 1928/04/30, 89 y.o.   MRN: [id number]  Problem: Education: Goal: Knowledge of General Education information will improve Description: Including pain rating scale, medication(s)/side effects and non-pharmacologic comfort measures Outcome: Progressing   Problem: Health Behavior/Discharge Planning: Goal: Ability to manage health-related needs will improve Outcome: Progressing   Problem: Clinical Measurements: Goal: Ability to maintain clinical measurements within normal limits will improve Outcome: Progressing Goal: Will remain free from infection Outcome: Progressing Goal: Diagnostic test results will improve Outcome: Progressing Goal: Respiratory complications will improve Outcome: Progressing Goal: Cardiovascular complication will be avoided Outcome: Progressing   Problem: Activity: Goal: Risk for activity intolerance will decrease Outcome: Progressing   Problem: Nutrition: Goal: Adequate nutrition will be maintained Outcome: Progressing   Problem: Coping: Goal: Level of anxiety will decrease Outcome: Progressing   Problem: Pain Management: Goal: General experience of comfort will improve Outcome: Progressing    Problem: Safety: Goal: Ability to remain free from injury will improve Outcome: Progressing   Problem: Skin Integrity: Goal: Risk for impaired skin integrity will decrease Outcome: Progressing  Problem: Elimination: Goal: Will not experience complications related to bowel motility Outcome: Not Progressing  Last recorded BM 12/22. Bowel sounds active upon auscultation. Miralax and bisacodyl given. Fluids and movement encouraged. Will continue with current plan of care.  Lidia Collum, RN

## 2023-07-07 NOTE — Progress Notes (Signed)
Progress Note   Patient: [name]:[id number] DOB: Aug 02, 1927 DOA: 07/04/2023     1 DOS: the patient was seen and examined on 07/07/2023   Brief hospital course: Taken from H&P.  TARIYAH FLITTER is a 89 y.o. female with medical history significant of breast cancer, CVA, depression, HTN, and HLD who presented on 12/26 with a fall.  She reports that she lives alone and uses a walker to get around.  She went to the bathroom and isn't sure what happened but she fell and hurt her arm.  No other injuries.   She was found to have right proximal humeral fracture and also seen a distal radial fracture which seems subacute but patient was unable to explain further injuries.  Orthopedic saw the patient this morning and recommending conservative management and her distal radius fracture looks like subacute to chronic.  They are recommending pain control, immobilization with sling and outpatient follow-up in 2 weeks.  Labs with worsening hemoglobin at 7.3 today, anemia panel consistent with iron deficiency, pending B12 levels.  Checking UA urine culture as she was complaining of dysuria.  PT is recommending SNF  12/28: Vital stable, hemoglobin decreased to 6.8-ordered 1 unit of PRBC, Anemia panel with B12 of 124, ferritin 4, iron deficiency and normal folate. Also starting IM B12 supplement for 3 days.  UA with large leukocytes and bacteria, urine cultures pending-starting on ceftriaxone.  12/29: Urine cultures with gram-negative rods.  Continuing ceftriaxone for now.  Pending SNF placement  Assessment and Plan: * Fall at home, initial encounter History of intermittent dizziness. Resulted in right proximal humerus fracture and a subacute distal radius fracture.  T1 compression fracture. Orthopedic was consulted and they are recommending conservative management with immobilization of both shoulder and wrist.  They will follow-up as outpatient in 2 weeks -PT and OT are recommending SNF -Continue  with pain management and supportive care -[facility] consult for placement  UTI (urinary tract infection) Patient was complaining of dysuria.  No leukocytosis or fever -UA with large leukocytes and many bacteria, urine cultures growing gram-negative rods. -Continue with ceftriaxone -Follow-up final culture results  Hypertension -Continue home amlodipine  Narcotic dependence (HCC) History of chronic pain.  [place] controlled substance reporting system was reviewed by admitting provider. Might be causing dizziness resulted in fall. Avoid escalating opioid therapy -Continue home meds  Microcytic anemia Hemoglobin improved to 8.9 after getting 1 unit of blood yesterday, no obvious bleeding, anemia panel with iron and B12 deficiency,  -Started on supplement -Continue to monitor  Staghorn calculus Noted on imaging  No obvious obstruction at this time  BREAST CANCER, HX OF S/p remote B mastectomies (1986) with reconstruction   Protein-calorie malnutrition, severe Estimated body mass index is 15.78 kg/m as calculated from the following:   Height as of this encounter: 5' 2.99" (1.6 m).   Weight as of this encounter: 40.4 kg.   -Dietitian consult      Subjective: Patient was seen and examined today.  No new concern.  Physical Exam: Vitals:   07/06/23 1341 07/06/23 1550 07/06/23 2114 07/07/23 0850  BP: 122/62 (!) 128/48 (!) 127/58 (!) 150/70  Pulse: 73 66 69 72  Resp: 16 15 17 16   Temp: 97.7 F (36.5 C) 98.4 F (36.9 C) 98.1 F (36.7 C) 99 F (37.2 C)  TempSrc: Oral     SpO2: 92% 93% 94% 91%  Weight:      Height:       General.  Frail and cachectic  elderly lady, in no acute distress. Pulmonary.  Lungs clear bilaterally, normal respiratory effort. CV.  Regular rate and rhythm, no JVD, rub or murmur. Abdomen.  Soft, nontender, nondistended, BS positive. CNS.  Alert and oriented .  No focal neurologic deficit. Extremities.  No edema, no cyanosis, pulses intact and symmetrical.   Right upper extremity in sling and right wrist with immobilizer  Data Reviewed: Prior data reviewed  Family Communication:   Disposition: Status is: Inpatient  inpatient because: Severity of illness  Planned Discharge Destination: Skilled nursing facility  DVT prophylaxis.  Lovenox Time spent: 42 minutes  This record has been created using Conservation officer, historic buildings. Errors have been sought and corrected,but may not always be located. Such creation errors do not reflect on the standard of care.   Author: Arnetha Courser, MD 07/07/2023 1:35 PM  For on call review [email or web address].

## 2023-07-08 DIAGNOSIS — S52531A Colles' fracture of right radius, initial encounter for closed fracture: Secondary | ICD-10-CM | POA: Diagnosis not present

## 2023-07-08 DIAGNOSIS — R300 Dysuria: Secondary | ICD-10-CM | POA: Diagnosis not present

## 2023-07-08 DIAGNOSIS — S42201A Unspecified fracture of upper end of right humerus, initial encounter for closed fracture: Secondary | ICD-10-CM | POA: Diagnosis not present

## 2023-07-08 DIAGNOSIS — W19XXXA Unspecified fall, initial encounter: Secondary | ICD-10-CM | POA: Diagnosis not present

## 2023-07-08 LAB — URINE CULTURE

## 2023-07-08 MED ORDER — TRAMADOL HCL 50 MG PO TABS
50.0000 mg | ORAL_TABLET | Freq: Four times a day (QID) | ORAL | Status: DC | PRN
  Administered 2023-07-08 – 2023-07-10 (×4): 50 mg via ORAL
  Filled 2023-07-08 (×4): qty 1

## 2023-07-08 MED ORDER — ACETAMINOPHEN 325 MG PO TABS
650.0000 mg | ORAL_TABLET | Freq: Four times a day (QID) | ORAL | Status: DC
  Administered 2023-07-08 – 2023-07-11 (×9): 650 mg via ORAL
  Filled 2023-07-08 (×12): qty 2

## 2023-07-08 MED ORDER — NYSTATIN 100000 UNIT/ML MT SUSP
5.0000 mL | Freq: Four times a day (QID) | OROMUCOSAL | Status: DC
  Administered 2023-07-08 – 2023-07-11 (×11): 500000 [IU] via ORAL
  Filled 2023-07-08 (×16): qty 5

## 2023-07-08 MED ORDER — PHENAZOPYRIDINE HCL 100 MG PO TABS
100.0000 mg | ORAL_TABLET | Freq: Three times a day (TID) | ORAL | Status: AC
  Administered 2023-07-08 – 2023-07-10 (×6): 100 mg via ORAL
  Filled 2023-07-08 (×6): qty 1

## 2023-07-08 NOTE — Consult Note (Signed)
[facility] Liaison Note  07/08/2023  [name] 1928-03-30 [id number]  Location: RN Hospital Liaison screened the patient remotely at [facility].  Insurance: [employer]   [name] is a 89 y.o. female who is a Primary Care Patient of Karie Schwalbe, MD [facility] with [facility]. The patient was screened for  readmission hospitalization with noted high risk score for unplanned readmission risk with 1 IP/1 ED in 6 months.  The patient was assessed for potential Care Management service needs for post hospital transition for care coordination. Review of patient's electronic medical record reveals patient was admitted ith Fall at home. Pt recommended for SNF level of care. Facility will continue to address pt's ongoing needs.  Plan: [facility] Liaison will continue to follow progress and disposition to asess for post hospital community care coordination/management needs.  Referral request for community care coordination: pending disposition.   [facility] Care Management/Population Health does not replace or interfere with any arrangements made by the Inpatient Transition of Care team.   For questions contact:   Elliot Cousin, RN, [facility] Liaison [place]   [facility], Population Health Office Hours MTWF  8:00 am-6:00 pm Direct Dial: [phone] mobile [phone] [Office toll free line] Office Hours are M-F 8:30 - 5 pm [email or web address]  '

## 2023-07-08 NOTE — TOC Progression Note (Signed)
Transition of Care [facility]) - Progression Note    Patient Details  Name: [name] MRN: [id number] Date of Birth: [birth year]  [facility]) CM/SW Contact  Marlowe Sax, RN Phone Number: 07/08/2023, 9:08 AM  Clinical Narrative:     Spoke with Son [name] explained the patient was made Inpatient status as expected.  I reviewed the bed choices and he chose [facility], I explained that Health [employer] advantage is medical Insurance and not long term care Ins, He had the understanding that [employer]TA covered the patient for Long term care Ins. I explaind the recommendation is for Short term rehab he agreed and said that he felt she would benefit from STR prior to returning to her apartment,    I called [employer] spoke with Tammy and provided the information asking for Ins authorization to go to [facility] also requesting authorization for EMS    Expected Discharge Plan and Services                                               Social Determinants of Health (SDOH) Interventions SDOH Screenings   Food Insecurity: Patient Unable To Answer (07/05/2023)  Housing: Patient Declined (07/05/2023)  Transportation Needs: Patient Unable To Answer (07/05/2023)  Utilities: Patient Unable To Answer (07/05/2023)  Depression (PHQ2-9): Low Risk  (10/29/2022)  Tobacco Use: Medium Risk (07/04/2023)    Readmission Risk Interventions     No data to display

## 2023-07-08 NOTE — Plan of Care (Signed)

## 2023-07-08 NOTE — Progress Notes (Signed)
Progress Note   Patient: [name] MVH:[id number] DOB: 16-Feb-1928 DOA: 07/04/2023     2 DOS: the patient was seen and examined on 07/08/2023   Brief hospital course: Taken from H&P.  [name] is a 89 y.o. female with medical history significant of breast cancer, CVA, depression, HTN, and HLD who presented on 12/26 with a fall.  She reports that she lives alone and uses a walker to get around.  She went to the bathroom and isn't sure what happened but she fell and hurt her arm.  No other injuries.   She was found to have right proximal humeral fracture and also seen a distal radial fracture which seems subacute but patient was unable to explain further injuries.  Orthopedic saw the patient this morning and recommending conservative management and her distal radius fracture looks like subacute to chronic.  They are recommending pain control, immobilization with sling and outpatient follow-up in 2 weeks.  Labs with worsening hemoglobin at 7.3 today, anemia panel consistent with iron deficiency, pending B12 levels.  Checking UA urine culture as she was complaining of dysuria.  PT is recommending SNF  12/28: Vital stable, hemoglobin decreased to 6.8-ordered 1 unit of PRBC, Anemia panel with B12 of 124, ferritin 4, iron deficiency and normal folate. Also starting IM B12 supplement for 3 days.  UA with large leukocytes and bacteria, urine cultures pending-starting on ceftriaxone.  12/29: Urine cultures with gram-negative rods.  Continuing ceftriaxone for now.  Pending SNF placement.  Hemoglobin improved to 8.9 after getting 1 unit of blood yesterday.  12/30: Urine cultures with Proteus mirabilis, resistant to nitrofurantoin, ampicillin and Augmentin.  Mild oral thrush so started on nystatin.  Still having burning micturition.  Assessment and Plan: * Fall at home, initial encounter History of intermittent dizziness. Resulted in right proximal humerus fracture and a subacute distal radius  fracture.  T1 compression fracture. Orthopedic was consulted and they are recommending conservative management with immobilization of both shoulder and wrist.  They will follow-up as outpatient in 2 weeks -PT and OT are recommending SNF -Continue with pain management and supportive care -[facility] consult for placement  UTI (urinary tract infection) Patient was complaining of dysuria.  No leukocytosis or fever -UA with large leukocytes and many bacteria, urine cultures with Proteus mirabilis, resistant to Augmentin, ampicillin and nitrofurantoin. -Continue with ceftriaxone-will switch to Keflex on discharge -Follow-up final culture results  Hypertension -Continue home amlodipine  Narcotic dependence (HCC) History of chronic pain.  [place] controlled substance reporting system was reviewed by admitting provider. Might be causing dizziness resulted in fall. Avoid escalating opioid therapy -Continue home meds  Microcytic anemia Hemoglobin improved to 8.9 after getting 1 unit of blood yesterday, no obvious bleeding, anemia panel with iron and B12 deficiency,  -Started on supplement -Continue to monitor  Staghorn calculus Noted on imaging  No obvious obstruction at this time  BREAST CANCER, HX OF S/p remote B mastectomies (1986) with reconstruction   Protein-calorie malnutrition, severe Estimated body mass index is 15.78 kg/m as calculated from the following:   Height as of this encounter: 5' 2.99" (1.6 m).   Weight as of this encounter: 40.4 kg.   -Dietitian consult      Subjective: Patient was resting comfortably when seen today.  Nursing concern of developing oral thrush as she was complaining of some pain in her mouth.  Still having urinary discomfort.  Physical Exam: Vitals:   07/07/23 1451 07/08/23 0007 07/08/23 0023 07/08/23 0800  BP: [name])  146/63 (!) 146/70 (!) 146/62 (!) 146/73  Pulse: 73 70 68 65  Resp: 16 16 20 17   Temp: 98 F (36.7 C) 98.3 F (36.8 C) 98.6 F (37 C)  97.9 F (36.6 C)  TempSrc:  Oral  Oral  SpO2: 92% 96% 94% 96%  Weight:      Height:       General.  Frail and severely malnourished elderly lady, in no acute distress. Pulmonary.  Lungs clear bilaterally, normal respiratory effort. CV.  Regular rate and rhythm, no JVD, rub or murmur. Abdomen.  Soft, nontender, nondistended, BS positive. CNS.  Alert and oriented to self only.  No focal neurologic deficit. Extremities.  No edema, no cyanosis, pulses intact and symmetrical.  RUE with sling and right wrist with immobilizer  Data Reviewed: Prior data reviewed  Family Communication:   Disposition: Status is: Inpatient  inpatient because: Severity of illness  Planned Discharge Destination: Skilled nursing facility  DVT prophylaxis.  Lovenox Time spent: 43 minutes  This record has been created using Conservation officer, historic buildings. Errors have been sought and corrected,but may not always be located. Such creation errors do not reflect on the standard of care.   Author: Arnetha Courser, MD 07/08/2023 2:46 PM  For on call review [email or web address].

## 2023-07-09 DIAGNOSIS — S42201A Unspecified fracture of upper end of right humerus, initial encounter for closed fracture: Secondary | ICD-10-CM | POA: Diagnosis not present

## 2023-07-09 DIAGNOSIS — R300 Dysuria: Secondary | ICD-10-CM | POA: Diagnosis not present

## 2023-07-09 DIAGNOSIS — W19XXXA Unspecified fall, initial encounter: Secondary | ICD-10-CM | POA: Diagnosis not present

## 2023-07-09 DIAGNOSIS — S52531A Colles' fracture of right radius, initial encounter for closed fracture: Secondary | ICD-10-CM | POA: Diagnosis not present

## 2023-07-09 MED ORDER — SALINE SPRAY 0.65 % NA SOLN
1.0000 | NASAL | Status: DC | PRN
  Administered 2023-07-09: 1 via NASAL
  Filled 2023-07-09: qty 44

## 2023-07-09 NOTE — Plan of Care (Signed)

## 2023-07-09 NOTE — Progress Notes (Signed)
 Progress Note   Patient: [name] FMW:[id number] DOB: [birth year] DOA: 07/04/2023     3 DOS: the patient was seen and examined on 07/09/2023   Brief hospital course: Taken from H&P.  LENNOX LEIKAM is a 89 y.o. female with medical history significant of breast cancer, CVA, depression, HTN, and HLD who presented on 12/26 with a fall.  She reports that she lives alone and uses a walker to get around.  She went to the bathroom and isn't sure what happened but she fell and hurt her arm.  No other injuries.   She was found to have right proximal humeral fracture and also seen a distal radial fracture which seems subacute but patient was unable to explain further injuries.  Orthopedic saw the patient this morning and recommending conservative management and her distal radius fracture looks like subacute to chronic.  They are recommending pain control, immobilization with sling and outpatient follow-up in 2 weeks.  Labs with worsening hemoglobin at 7.3 today, anemia panel consistent with iron deficiency, pending B12 levels.  Checking UA urine culture as she was complaining of dysuria.  PT is recommending SNF  12/28: Vital stable, hemoglobin decreased to 6.8-ordered 1 unit of PRBC, Anemia panel with B12 of 124, ferritin 4, iron deficiency and normal folate. Also starting IM B12 supplement for 3 days.  UA with large leukocytes and bacteria, urine cultures pending-starting on ceftriaxone .  12/29: Urine cultures with gram-negative rods.  Continuing ceftriaxone  for now.  Pending SNF placement.  Hemoglobin improved to 8.9 after getting 1 unit of blood yesterday.  12/30: Urine cultures with Proteus mirabilis, resistant to nitrofurantoin, ampicillin and Augmentin .  Mild oral thrush so started on nystatin .  Still having burning micturition.  12/31: Peer to peer was requested today which was done and was approved.  Patient is a very frail and malnourished elderly lady, not sure how she was managing  herself at home.  She was barely walking few feet with the help of PT. she need assistance to be fed as her right upper extremity is in sling.  Patient likely will need more assistance in the future.  Unsure how correct was her baseline which was told by family.  Assessment and Plan: * Fall at home, initial encounter History of intermittent dizziness. Resulted in right proximal humerus fracture and a subacute distal radius fracture.  T1 compression fracture. Orthopedic was consulted and they are recommending conservative management with immobilization of both shoulder and wrist.  They will follow-up as outpatient in 2 weeks -PT and OT are recommending SNF -Continue with pain management and supportive care -[facility] consult for placement  UTI (urinary tract infection) Patient was complaining of dysuria.  No leukocytosis or fever -UA with large leukocytes and many bacteria, urine cultures with Proteus mirabilis, resistant to Augmentin , ampicillin and nitrofurantoin. -Continue with ceftriaxone -will switch to Keflex  on discharge -Follow-up final culture results  Hypertension -Continue home amlodipine   Narcotic dependence (HCC) History of chronic pain.  [place] controlled substance reporting system was reviewed by admitting provider. Might be causing dizziness resulted in fall. Avoid escalating opioid therapy -Continue home meds  Microcytic anemia Hemoglobin improved to 8.9 after getting 1 unit of blood yesterday, no obvious bleeding, anemia panel with iron and B12 deficiency,  -Started on supplement -Continue to monitor  Staghorn calculus Noted on imaging  No obvious obstruction at this time  BREAST CANCER, HX OF S/p remote B mastectomies (1986) with reconstruction   Protein-calorie malnutrition, severe Estimated body mass index is 15.78  kg/m as calculated from the following:   Height as of this encounter: 5' 2.99 (1.6 m).   Weight as of this encounter: 40.4 kg.   -Dietitian  consult      Subjective: Patient was lying down when seen today.  She was requesting if someone can help her with food as she was unable to feed herself.  Physical Exam: Vitals:   07/08/23 1613 07/08/23 2350 07/09/23 0859 07/09/23 1537  BP: (!) 131/104 (!) 111/55 (!) 157/80 (!) 134/116  Pulse: 63 67 61 74  Resp: 16 17 18 12   Temp: 98.4 F (36.9 C) 97.8 F (36.6 C) 98 F (36.7 C) 97.8 F (36.6 C)  TempSrc:  Oral    SpO2: 94% 94% 94% 93%  Weight:      Height:       General.  Frail and severely malnourished elderly lady, in no acute distress. Pulmonary.  Lungs clear bilaterally, normal respiratory effort. CV.  Regular rate and rhythm, no JVD, rub or murmur. Abdomen.  Soft, nontender, nondistended, BS positive. CNS.  Alert and oriented .  No focal neurologic deficit. Extremities.  No edema, no cyanosis, pulses intact and symmetrical.  Right upper extremity in sling and right wrist in immobilizer   Data Reviewed: Prior data reviewed  Family Communication:   Disposition: Status is: Inpatient  inpatient because: Severity of illness  Planned Discharge Destination: Skilled nursing facility  DVT prophylaxis.  Lovenox  Time spent: 45 minutes  This record has been created using Conservation officer, historic buildings. Errors have been sought and corrected,but may not always be located. Such creation errors do not reflect on the standard of care.   Author: Amaryllis Dare, MD 07/09/2023 5:38 PM  For on call review [email or web address].

## 2023-07-09 NOTE — TOC Progression Note (Signed)
 Transition of Care [facility]) - Progression Note    Patient Details  Name: [name] MRN: [id number] Date of Birth: [birth year]  Transition of Care [facility] CM/SW Contact  Royanne JINNY Bernheim, RN Phone Number: 07/09/2023, 9:02 AM  Clinical Narrative:    [facility] called and stated they do not feel that she meets medical necessity and offering a PEER to peer, Physican to call Dr Janit at [phone] by end of day        Expected Discharge Plan and Services                                               Social Determinants of Health (SDOH) Interventions SDOH Screenings   Food Insecurity: Patient Unable To Answer (07/05/2023)  Housing: Patient Declined (07/05/2023)  Transportation Needs: Patient Unable To Answer (07/05/2023)  Utilities: Patient Unable To Answer (07/05/2023)  Depression (PHQ2-9): Low Risk  (10/29/2022)  Social Connections: Patient Unable To Answer (07/09/2023)  Tobacco Use: Medium Risk (07/04/2023)    Readmission Risk Interventions     No data to display

## 2023-07-09 NOTE — Plan of Care (Signed)
 Patient ID: [name], female   DOB: [birth year], 89 y.o.   MRN: [id number]  Problem: Education: Goal: Knowledge of General Education information will improve Description: Including pain rating scale, medication(s)/side effects and non-pharmacologic comfort measures Outcome: Progressing   Problem: Health Behavior/Discharge Planning: Goal: Ability to manage health-related needs will improve Outcome: Progressing   Problem: Clinical Measurements: Goal: Ability to maintain clinical measurements within normal limits will improve Outcome: Progressing Goal: Will remain free from infection Outcome: Progressing Goal: Diagnostic test results will improve Outcome: Progressing Goal: Respiratory complications will improve Outcome: Progressing Goal: Cardiovascular complication will be avoided Outcome: Progressing   Problem: Activity: Goal: Risk for activity intolerance will decrease Outcome: Progressing   Problem: Nutrition: Goal: Adequate nutrition will be maintained Outcome: Progressing   Problem: Coping: Goal: Level of anxiety will decrease Outcome: Progressing   Problem: Elimination: Goal: Will not experience complications related to bowel motility Outcome: Progressing Goal: Will not experience complications related to urinary retention Outcome: Progressing   Problem: Pain Management: Goal: General experience of comfort will improve Outcome: Progressing   Problem: Safety: Goal: Ability to remain free from injury will improve Outcome: Progressing   Problem: Skin Integrity: Goal: Risk for impaired skin integrity will decrease Outcome: Progressing    Verdie JONETTA Collier, RN

## 2023-07-09 NOTE — Assessment & Plan Note (Signed)
 History of intermittent dizziness. Resulted in right proximal humerus fracture and a subacute distal radius fracture.  T1 compression fracture. Orthopedic was consulted and they are recommending conservative management with immobilization of both shoulder and wrist.  They will follow-up as outpatient in 2 weeks -PT and OT are recommending SNF -Continue with pain management and supportive care -[facility] consult for placement

## 2023-07-09 NOTE — Progress Notes (Signed)
 Physical Therapy Treatment Patient Details Name: [name] MRN: [id number] DOB: [birth year] Today's Date: 07/09/2023   History of Present Illness Pt is a 89 y.o. female with medical history significant of breast cancer, CVA, depression, HTN, COPD, chronic pain, and HLD who presented on 12/26 after an unwitnessed fall at home.  MD assessment includes: dizziness with fall with noted right proximal humerus fracture and subacute right distal radius fracture to be managed conservatively.  Of note, pt reports poor vision at baseline.    PT Comments  Pt tolerated session well. She was hesitant initially to get out of bed but then was agreeable to transfer to bedside chair for breakfast. Patient was in bed with head of bed heavily elevated. RN had provided pain meds prior to session. She was instructed to transition to sit edge of bed, requiring min A for safety. Patient able to move legs well but does require minimal physical assistance for trunk management and balance. Once seated edge of bed she transferred sit to stand with min A with flexed posture but good weight acceptance in lower extremities. She was able to take a few steps to bedside chair x4 feet with min A (therapist holding under left upper extremity). Patient does demonstrate mild unsteadiness and is a high risk for falls. She was instructed in seated lower extremity exercise with good range of motion noted. Patient does report increased fatigue. While she was able to take a few steps to bedside chair, she declined additional ambulation reporting increased fatigue. Patient would benefit from additional skilled PT Intervention to improve strength, balance and mobility.     If plan is discharge home, recommend the following: A lot of help with walking and/or transfers;A lot of help with bathing/dressing/bathroom;Assistance with cooking/housework;Direct supervision/assist for medications management;Assist for transportation;Direct  supervision/assist for financial management   Can travel by private vehicle        Equipment Recommendations       Recommendations for Other Services       Precautions / Restrictions Precautions Precautions: Fall Required Braces or Orthoses: Sling Restrictions Weight Bearing Restrictions Per Provider Order: Yes RUE Weight Bearing Per Provider Order: Non weight bearing     Mobility  Bed Mobility Overal bed mobility: Needs Assistance Bed Mobility: Supine to Sit     Supine to sit: Min assist, HOB elevated, Used rails     General bed mobility comments: Pt able to slide legs off bed well, head of bed heavily elevated making it easier for patient to complete transition to sit.    Transfers Overall transfer level: Needs assistance Equipment used: 1 person hand held assist Transfers: Sit to/from Stand Sit to Stand: Min assist           General transfer comment: Pt transferred to stand with min A with therapist holding LUE.    Ambulation/Gait Ambulation/Gait assistance: Min assist Gait Distance (Feet): 4 Feet Assistive device: 1 person hand held assist Gait Pattern/deviations: Step-to pattern, Shuffle Gait velocity: decreased     General Gait Details: Pt able to take several small steps from bed to recliner (x4 feet) with good weight shift, slower speed; Requires min A for balance and steadying;   Stairs             Wheelchair Mobility     Tilt Bed    Modified Rankin (Stroke Patients Only)       Balance Overall balance assessment: Needs assistance Sitting-balance support: Single extremity supported Sitting balance-Leahy Scale: Good  Standing balance support: Single extremity supported, During functional activity Standing balance-Leahy Scale: Fair                              Cognition Arousal: Lethargic Behavior During Therapy: Flat affect Overall Cognitive Status: No family/caregiver present to determine baseline cognitive  functioning                                 General Comments: Able to follow most 1-step commands with extra time and cuing, difficulty providing history        Exercises Other Exercises Other Exercises: Instructed patient in LE strengthening exercise, seated in recliner: Hip flexion march x10 reps, LAQ x10 reps, heel raises x10, toe raises x10 Other Exercises: Pt required min VCs to increase AROM for better strengthening. Able to tolerate well (x8 min)    General Comments        Pertinent Vitals/Pain Pain Assessment Pain Score: 7  Pain Descriptors / Indicators: Aching, Sore Pain Intervention(s): Limited activity within patient's tolerance, Monitored during session, Premedicated before session    Home Living                          Prior Function            PT Goals (current goals can now be found in the care plan section) Acute Rehab PT Goals PT Goal Formulation: Patient unable to participate in goal setting Time For Goal Achievement: 07/18/23 Potential to Achieve Goals: Fair    Frequency    Min 1X/week      PT Plan      Co-evaluation              AM-PAC PT 6 Clicks Mobility   Outcome Measure  Help needed turning from your back to your side while in a flat bed without using bedrails?: A Lot Help needed moving from lying on your back to sitting on the side of a flat bed without using bedrails?: A Little Help needed moving to and from a bed to a chair (including a wheelchair)?: A Little Help needed standing up from a chair using your arms (e.g., wheelchair or bedside chair)?: A Little Help needed to walk in hospital room?: A Little Help needed climbing 3-5 steps with a railing? : A Lot 6 Click Score: 16    End of Session Equipment Utilized During Treatment: Gait belt Activity Tolerance: Patient tolerated treatment well Patient left: in chair;with chair alarm set (Pt was using pancake call bell, however RN prefers chair alarm  plugged in; RN aware that patient can't have both pancake call bell and chair alarm and has chosen chair alarm for sitting safety) Nurse Communication: Mobility status PT Visit Diagnosis: Unsteadiness on feet (R26.81);History of falling (Z91.81);Difficulty in walking, not elsewhere classified (R26.2);Muscle weakness (generalized) (M62.81);Pain Pain - Right/Left: Right Pain - part of body: Arm     Time: 0832-0902 PT Time Calculation (min) (ACUTE ONLY): 30 min  Charges:    $Therapeutic Exercise: 8-22 mins $Therapeutic Activity: 8-22 mins PT General Charges $$ ACUTE PT VISIT: 1 Visit                       Jamayia Croker PT, DPT 07/09/2023, 9:19 AM

## 2023-07-10 DIAGNOSIS — S42201A Unspecified fracture of upper end of right humerus, initial encounter for closed fracture: Secondary | ICD-10-CM | POA: Diagnosis not present

## 2023-07-10 DIAGNOSIS — W19XXXA Unspecified fall, initial encounter: Secondary | ICD-10-CM | POA: Diagnosis not present

## 2023-07-10 DIAGNOSIS — Y92009 Unspecified place in unspecified non-institutional (private) residence as the place of occurrence of the external cause: Secondary | ICD-10-CM | POA: Diagnosis not present

## 2023-07-10 MED ORDER — SODIUM CHLORIDE 0.9 % IV SOLN
1.0000 g | INTRAVENOUS | Status: AC
  Administered 2023-07-11: 1 g via INTRAVENOUS
  Filled 2023-07-10: qty 10

## 2023-07-10 NOTE — Plan of Care (Signed)

## 2023-07-10 NOTE — Progress Notes (Signed)
 Progress Note   Patient: [name] FMW:[id number] DOB: [birth year] DOA: 07/04/2023     4 DOS: the patient was seen and examined on 07/10/2023   Brief hospital course: [name] is a 89 y.o. female with medical history significant of breast cancer, CVA, depression, HTN, and HLD who presented on 12/26 with a fall.   She was found to have right proximal humeral fracture and also seen a distal radial fracture which seems subacute but patient was unable to explain further injuries.  Orthopedic saw the patient and recommending conservative management and her distal radius fracture looks like subacute to chronic.  They are recommending pain control, immobilization with sling and outpatient follow-up in 2 weeks. Also treated for UTI during hospitalization.  PT/OT recommended SNF at dc. She is currently awaiting placement after a peer-to-peer was completed.    Assessment and Plan: Fall at home, initial encounter Resulted in right proximal humerus fracture and a subacute distal radius fracture.  T1 compression fracture. Orthopedic was consulted and they are recommending conservative management with immobilization of both shoulder and wrist.  They will follow-up as outpatient in 2 weeks -PT and OT are recommending SNF -Continue with pain management and supportive care -[facility] consult for placement  UTI (urinary tract infection) Patient was complaining of dysuria.  No leukocytosis or fever -UA with large leukocytes and many bacteria, urine cultures with Proteus mirabilis, resistant to Augmentin , ampicillin and nitrofurantoin. -Continue with ceftriaxone  until 1/2  Hypertension -Continue home amlodipine   Narcotic dependence (HCC) History of chronic pain.  Might be causing dizziness resulted in fall. Avoid escalating opioid therapy -Continue home meds  Microcytic anemia- s/p 1 unit of blood. Significant ecchymosis on R arm. No other signs of bleeding -Started on supplement -Continue to  monitor  Staghorn calculus Noted on imaging  No obvious obstruction at this time  BREAST CANCER, HX OF S/p remote B mastectomies (1986) with reconstruction   Protein-calorie malnutrition, severe Estimated body mass index is 15.78 kg/m as calculated from the following:   Height as of this encounter: 5' 2.99 (1.6 m).   Weight as of this encounter: 40.4 kg.   -Dietitian consult     Subjective: Patient reports no complaints today. Pain is well controlled. Doing well overall. She is motivated to get back to her apartment after rehab.   Physical Exam: Vitals:   07/09/23 0859 07/09/23 1537 07/09/23 2308 07/10/23 0724  BP: (!) 157/80 (!) 134/116 (!) 140/60 136/66  Pulse: 61 74 64 67  Resp: 18 12 17 16   Temp: 98 F (36.7 C) 97.8 F (36.6 C) 98.6 F (37 C) 98.1 F (36.7 C)  TempSrc:      SpO2: 94% 93% 93% 94%  Weight:      Height:       General.  Frail, in no acute distress. Pulmonary.  Lungs clear bilaterally, normal respiratory effort. CV.  Regular rate and rhythm, no JVD, rub or murmur. Abdomen.  Soft, nontender, nondistended, BS positive. CNS.  Alert and oriented .  No focal neurologic deficit. Extremities.  No edema, no cyanosis, pulses intact and symmetrical.  Right upper extremity in sling and right wrist in immobilizer. Ecchymosis present. Non-tender to palpation   Data Reviewed: Prior data reviewed  Family Communication:   Disposition: Status is: Inpatient  inpatient because: Severity of illness  Planned Discharge Destination: Skilled nursing facility  DVT prophylaxis.  Lovenox  Time spent: 45 minutes   Author: Marien LITTIE Piety, MD 07/10/2023 7:42 AM  For on call  review [email or web address].

## 2023-07-10 NOTE — TOC Progression Note (Signed)
 Transition of Care [facility]) - Progression Note    Patient Details  Name: [name] MRN: [id number] Date of Birth: Nov 17, 1927  Transition of Care [facility]) CM/SW Contact  Ladene Lady, LCSW Phone Number: 07/10/2023, 11:48 AM  Clinical Narrative:   LICIA barrows approved for 7 days.   EMS: [id number] Snf: [id number]         Expected Discharge Plan and Services                                               Social Determinants of Health (SDOH) Interventions SDOH Screenings   Food Insecurity: Patient Unable To Answer (07/05/2023)  Housing: Patient Declined (07/05/2023)  Transportation Needs: Patient Unable To Answer (07/05/2023)  Utilities: Patient Unable To Answer (07/05/2023)  Depression (PHQ2-9): Low Risk  (10/29/2022)  Social Connections: Patient Unable To Answer (07/09/2023)  Tobacco Use: Medium Risk (07/04/2023)    Readmission Risk Interventions     No data to display

## 2023-07-11 DIAGNOSIS — R404 Transient alteration of awareness: Secondary | ICD-10-CM | POA: Diagnosis not present

## 2023-07-11 DIAGNOSIS — J309 Allergic rhinitis, unspecified: Secondary | ICD-10-CM | POA: Diagnosis not present

## 2023-07-11 DIAGNOSIS — F4322 Adjustment disorder with anxiety: Secondary | ICD-10-CM | POA: Diagnosis not present

## 2023-07-11 DIAGNOSIS — Y92009 Unspecified place in unspecified non-institutional (private) residence as the place of occurrence of the external cause: Secondary | ICD-10-CM | POA: Diagnosis not present

## 2023-07-11 DIAGNOSIS — E43 Unspecified severe protein-calorie malnutrition: Secondary | ICD-10-CM | POA: Diagnosis not present

## 2023-07-11 DIAGNOSIS — H409 Unspecified glaucoma: Secondary | ICD-10-CM | POA: Diagnosis not present

## 2023-07-11 DIAGNOSIS — S42291A Other displaced fracture of upper end of right humerus, initial encounter for closed fracture: Secondary | ICD-10-CM | POA: Diagnosis not present

## 2023-07-11 DIAGNOSIS — R1310 Dysphagia, unspecified: Secondary | ICD-10-CM | POA: Diagnosis not present

## 2023-07-11 DIAGNOSIS — Z8673 Personal history of transient ischemic attack (TIA), and cerebral infarction without residual deficits: Secondary | ICD-10-CM | POA: Diagnosis not present

## 2023-07-11 DIAGNOSIS — G4700 Insomnia, unspecified: Secondary | ICD-10-CM | POA: Diagnosis not present

## 2023-07-11 DIAGNOSIS — I10 Essential (primary) hypertension: Secondary | ICD-10-CM | POA: Diagnosis not present

## 2023-07-11 DIAGNOSIS — Z9181 History of falling: Secondary | ICD-10-CM | POA: Diagnosis not present

## 2023-07-11 DIAGNOSIS — N390 Urinary tract infection, site not specified: Secondary | ICD-10-CM | POA: Diagnosis not present

## 2023-07-11 DIAGNOSIS — H04123 Dry eye syndrome of bilateral lacrimal glands: Secondary | ICD-10-CM | POA: Diagnosis not present

## 2023-07-11 DIAGNOSIS — L89616 Pressure-induced deep tissue damage of right heel: Secondary | ICD-10-CM | POA: Diagnosis not present

## 2023-07-11 DIAGNOSIS — M6281 Muscle weakness (generalized): Secondary | ICD-10-CM | POA: Diagnosis not present

## 2023-07-11 DIAGNOSIS — M25531 Pain in right wrist: Secondary | ICD-10-CM | POA: Diagnosis not present

## 2023-07-11 DIAGNOSIS — R52 Pain, unspecified: Secondary | ICD-10-CM | POA: Diagnosis not present

## 2023-07-11 DIAGNOSIS — L89626 Pressure-induced deep tissue damage of left heel: Secondary | ICD-10-CM | POA: Diagnosis not present

## 2023-07-11 DIAGNOSIS — S42201A Unspecified fracture of upper end of right humerus, initial encounter for closed fracture: Secondary | ICD-10-CM | POA: Diagnosis not present

## 2023-07-11 DIAGNOSIS — S42201D Unspecified fracture of upper end of right humerus, subsequent encounter for fracture with routine healing: Secondary | ICD-10-CM | POA: Diagnosis not present

## 2023-07-11 DIAGNOSIS — W19XXXA Unspecified fall, initial encounter: Secondary | ICD-10-CM | POA: Diagnosis not present

## 2023-07-11 DIAGNOSIS — Z853 Personal history of malignant neoplasm of breast: Secondary | ICD-10-CM | POA: Diagnosis not present

## 2023-07-11 DIAGNOSIS — R1312 Dysphagia, oropharyngeal phase: Secondary | ICD-10-CM | POA: Diagnosis not present

## 2023-07-11 DIAGNOSIS — Z743 Need for continuous supervision: Secondary | ICD-10-CM | POA: Diagnosis not present

## 2023-07-11 DIAGNOSIS — B029 Zoster without complications: Secondary | ICD-10-CM | POA: Diagnosis not present

## 2023-07-11 DIAGNOSIS — I739 Peripheral vascular disease, unspecified: Secondary | ICD-10-CM | POA: Diagnosis not present

## 2023-07-11 DIAGNOSIS — I5032 Chronic diastolic (congestive) heart failure: Secondary | ICD-10-CM | POA: Diagnosis not present

## 2023-07-11 DIAGNOSIS — G629 Polyneuropathy, unspecified: Secondary | ICD-10-CM | POA: Diagnosis not present

## 2023-07-11 DIAGNOSIS — K5900 Constipation, unspecified: Secondary | ICD-10-CM | POA: Diagnosis not present

## 2023-07-11 DIAGNOSIS — S52501D Unspecified fracture of the lower end of right radius, subsequent encounter for closed fracture with routine healing: Secondary | ICD-10-CM | POA: Diagnosis not present

## 2023-07-11 DIAGNOSIS — R300 Dysuria: Secondary | ICD-10-CM | POA: Diagnosis not present

## 2023-07-11 DIAGNOSIS — L98491 Non-pressure chronic ulcer of skin of other sites limited to breakdown of skin: Secondary | ICD-10-CM | POA: Diagnosis not present

## 2023-07-11 DIAGNOSIS — D649 Anemia, unspecified: Secondary | ICD-10-CM | POA: Diagnosis not present

## 2023-07-11 DIAGNOSIS — F419 Anxiety disorder, unspecified: Secondary | ICD-10-CM | POA: Diagnosis not present

## 2023-07-11 DIAGNOSIS — K219 Gastro-esophageal reflux disease without esophagitis: Secondary | ICD-10-CM | POA: Diagnosis not present

## 2023-07-11 DIAGNOSIS — S52551A Other extraarticular fracture of lower end of right radius, initial encounter for closed fracture: Secondary | ICD-10-CM | POA: Diagnosis not present

## 2023-07-11 DIAGNOSIS — E785 Hyperlipidemia, unspecified: Secondary | ICD-10-CM | POA: Diagnosis not present

## 2023-07-11 LAB — CBC
HCT: 32.6 % — ABNORMAL LOW (ref 36.0–46.0)
Hemoglobin: 10 g/dL — ABNORMAL LOW (ref 12.0–15.0)
MCH: 24 pg — ABNORMAL LOW (ref 26.0–34.0)
MCHC: 30.7 g/dL (ref 30.0–36.0)
MCV: 78.4 fL — ABNORMAL LOW (ref 80.0–100.0)
Platelets: 242 10*3/uL (ref 150–400)
RBC: 4.16 MIL/uL (ref 3.87–5.11)
RDW: 21.8 % — ABNORMAL HIGH (ref 11.5–15.5)
WBC: 5.9 10*3/uL (ref 4.0–10.5)
nRBC: 0 % (ref 0.0–0.2)

## 2023-07-11 MED ORDER — ACETAMINOPHEN 325 MG PO TABS: 650.0000 mg | ORAL_TABLET | Freq: Four times a day (QID) | ORAL | Status: AC | PRN

## 2023-07-11 MED ORDER — LORAZEPAM 0.5 MG PO TABS: 0.2500 mg | ORAL_TABLET | Freq: Two times a day (BID) | ORAL | Status: DC | PRN

## 2023-07-11 MED ORDER — SIMETHICONE 40 MG/0.6ML PO SUSP
40.0000 mg | Freq: Once | ORAL | Status: DC
  Filled 2023-07-11: qty 0.6

## 2023-07-11 NOTE — Progress Notes (Signed)
 Approximately 1540-- Pt discharged to [facility] with medical transport. Full report provided earlier by this RN to Jon, CHARITY FUNDRAISER at [facility]. All questions answered at this time. All PIVs removed--sites WDL. AVS, discharge packet, and pt belongings provided to medical transport.

## 2023-07-11 NOTE — Discharge Summary (Signed)
 Physician Discharge Summary  Patient: [name] FMW:[id number] DOB: 1928-03-29   Code Status: Limited: Do not attempt resuscitation (DNR) -DNR-LIMITED -Do Not Intubate/DNI  Admit date: 07/04/2023 Discharge date: 05-Aug-2023 Disposition: Skilled nursing facility, PT, OT, SLP, nurse aid, RN, and wound care PCP: Jimmy Charlie FERNS, MD  Recommendations for Outpatient Follow-up:  Follow up with PCP within 1-2 weeks Regarding general hospital follow up and preventative care Recommend pain and anxiety medications causing sedation. Likely contributed to her multiple falls Follow up with ortho surgery 2 weeks post discharge  Discharge Diagnoses:  Principal Problem:   Fall at home, initial encounter Active Problems:   Proximal humerus fracture   Closed fracture of right distal radius   UTI (urinary tract infection)   Hypertension   Narcotic dependence (HCC)   Microcytic anemia   Staghorn calculus   BREAST CANCER, HX OF   Protein-calorie malnutrition, severe   GERD   DNR (do not resuscitate)  Brief Hospital Course Summary: [name] is a 89 y.o. female with medical history significant of breast cancer, CVA, depression, HTN, and HLD who presented on 12/26 with a fall.    She was found to have right proximal humeral fracture and also seen a distal radial fracture which seems subacute but patient was unable to explain further injuries.  Orthopedic saw the patient and recommending conservative management and her distal radius fracture looks like subacute to chronic.  They are recommending pain control, immobilization with sling and outpatient follow-up in 2 weeks. Also treated for UTI during hospitalization which was completed 1/1.  PT/OT recommended SNF at dc. She is currently awaiting placement after a peer-to-peer was completed.  Discharge Condition: Good, improved Recommended discharge diet: Regular healthy diet  Consultations: Ortho   Procedures/Studies: None   Allergies  as of 2023-08-05       Reactions   Duloxetine     REACTION: N/V Mental status change and trouble with balance   Cymbalta  [duloxetine  Hcl]    Unsure of reaction   Maxitrol [neomycin -polymyxin-dexameth]    Unsure of reaction   Pregabalin    REACTION: SOB, swollen lips   Tobradex [tobramycin-dexamethasone]    Unsure of reaction        Medication List     STOP taking these medications    LORazepam  1 MG tablet Commonly known as: ATIVAN    morphine  15 MG tablet Commonly known as: MSIR       TAKE these medications    acetaminophen  325 MG tablet Commonly known as: TYLENOL  Take 2 tablets (650 mg total) by mouth every 6 (six) hours as needed for mild pain (pain score 1-3), moderate pain (pain score 4-6), fever or headache (or Fever >/= 101).   acyclovir  200 MG capsule Commonly known as: ZOVIRAX  TAKE ONE CAPSULE BY MOUTH TWO TIMES DAILY   amLODipine  5 MG tablet Commonly known as: NORVASC  Take 1 tablet (5 mg total) by mouth daily.   antiseptic oral rinse Liqd 15 mLs by Mouth Rinse route as needed for dry mouth.   azelastine  0.1 % nasal spray Commonly known as: ASTELIN  Place 2 sprays into both nostrils 2 (two) times daily.   feeding supplement Liqd Take 237 mLs by mouth 3 (three) times daily between meals.   fexofenadine 180 MG tablet Commonly known as: ALLEGRA Take 180 mg by mouth daily.   fluticasone  50 MCG/ACT nasal spray Commonly known as: FLONASE  PLACE TWO SPRAYS INTO BOTH NOSTRILS DAILY AS NEEDED   multivitamin tablet Take 1 tablet by  mouth once daily   omeprazole  20 MG capsule Commonly known as: PRILOSEC TAKE ONE CAPSULE BY MOUTH DAILY   polyethylene glycol 17 g packet Commonly known as: MIRALAX  / GLYCOLAX  Take 17 g by mouth at bedtime.   Restasis  0.05 % ophthalmic emulsion Generic drug: cycloSPORINE  Place 1 drop into both eyes 2 (two) times daily.   topiramate  50 MG tablet Commonly known as: TOPAMAX  TAKE ONE TABLET BY MOUTH AT BEDTIME FOR  COMPLICATION OF SHINGLES        Contact information for follow-up providers     Kathlynn Sharper, MD. Schedule an appointment as soon as possible for a visit in 1 week.   Specialty: Orthopedic Surgery Contact information: [address] [place] [address] [phone]              Contact information for after-discharge care     Destination     [facility] .   Service: Skilled Nursing Contact information: [address] [place]  [address] [phone]                     Subjective   Pt reports doing well. Denies pain in her arm at rest. Denies dysuria.   All questions and concerns were addressed at time of discharge.  Objective  Blood pressure (!) 157/87, pulse 69, temperature 98.1 F (36.7 C), resp. rate 16, height 5' 2.99 (1.6 m), weight 40.4 kg, SpO2 97%.   General: Pt is alert, awake, not in acute distress Cardiovascular: RRR, S1/S2 +, no rubs, no gallops Respiratory: CTA bilaterally, no wheezing, no rhonchi Abdominal: Soft, NT, ND, bowel sounds + Extremities: significant ecchymosis of R upper arm. Non-tender to palpation   The results of significant diagnostics from this hospitalization (including imaging, microbiology, ancillary and laboratory) are listed below for reference.   Imaging studies: CT CHEST ABDOMEN PELVIS W CONTRAST Result Date: 07/04/2023 CLINICAL DATA:  Polytrauma, blunt.  Fall EXAM: CT CHEST, ABDOMEN, AND PELVIS WITH CONTRAST TECHNIQUE: Multidetector CT imaging of the chest, abdomen and pelvis was performed following the standard protocol during bolus administration of intravenous contrast. RADIATION DOSE REDUCTION: This exam was performed according to the departmental dose-optimization program which includes automated exposure control, adjustment of the mA and/or kV according to patient size and/or use of iterative reconstruction technique. CONTRAST:  75mL  OMNIPAQUE  IOHEXOL  350 MG/ML SOLN COMPARISON:  CT chest 10/01/2021. FINDINGS: CHEST: Cardiovascular: No aortic injury. The thoracic aorta is normal in caliber. Moderate to severe atherosclerotic plaque. Mitral annular calcification. Coronary artery calcification. The heart is normal in size. No significant pericardial effusion. The main pulmonary artery is normal in caliber. No central or proximal segmental pulmonary embolus. Limited evaluation more distally due to timing of contrast and motion artifact. Mediastinum/Nodes: No pneumomediastinum. No mediastinal hematoma. The esophagus is unremarkable. Redemonstration of a heterogeneous left thyroid  gland-no further follow-up indicated. Right thyroid  gland not visualized-likely surgically removed. The central airways are patent. No mediastinal, hilar, or axillary lymphadenopathy. Lungs/Pleura: Biapical pleural/pulmonary scarring. Mild interlobular septal wall thickening. Mild centrilobular emphysematous changes. Mild paraseptal emphysematous changes. No focal consolidation. No pulmonary nodule. No pulmonary mass. No pulmonary contusion or laceration. No pneumatocele formation. Moderate volume right pleural effusion. Trace volume left pleural effusion. Bilateral lower lobe passive atelectasis. No pneumothorax. No hemothorax. Musculoskeletal/Chest wall: No chest wall mass.  Bilateral breast implants. Acute comminuted and displaced impacted right humeral head and neck fracture. Likely right calcific tendinosis. Old healed left humeral proximal fracture. Old  healed right distal radial and ulnar fractures. No acute rib or sternal fracture. No spinal fracture. Interval development of vague superior endplate concavity of the T1 and T3 levels. Cervical spine surgical hardware. ABDOMEN / PELVIS: Hepatobiliary: Not enlarged. No focal lesion. No laceration or subcapsular hematoma. The gallbladder is otherwise unremarkable with no radio-opaque gallstones. No biliary ductal  dilatation. Pancreas: Normal pancreatic contour. No main pancreatic duct dilatation. Spleen: Not enlarged. No focal lesion. No laceration, subcapsular hematoma, or vascular injury. Adrenals/Urinary Tract: No nodularity bilaterally. Bilateral kidneys enhance symmetrically. Bilateral staghorn calculi with largest on the left measuring 3 x 1.7 cm and 2.2 x 1.3 cm on the right. Interval increase in size of a 1.7 x 1.1 cm (from 1.1 x 0.9 cm) right hypodense lesion that demonstrates a density of 33 Hounsfield units. No hydronephrosis. No contusion, laceration, or subcapsular hematoma. No injury to the vascular structures or collecting systems. No hydroureter. The urinary bladder is unremarkable. Stomach/Bowel: No small or large bowel wall thickening or dilatation. Stool throughout the colon. The appendix is unremarkable. Vasculature/Lymphatics: Severe atherosclerotic plaque. No abdominal aorta or iliac aneurysm. No active contrast extravasation or pseudoaneurysm. No abdominal, pelvic, inguinal lymphadenopathy. Reproductive: Normal. Other: No simple free fluid ascites. No pneumoperitoneum. No hemoperitoneum. No mesenteric hematoma identified. No organized fluid collection. Musculoskeletal: No significant soft tissue hematoma. No acute pelvic fracture. No spinal fracture. Stable chronic L1 compression fracture. Diffusely decreased bone density. Partially visualized left femur intramedullary nail fixation. Ports and Devices: None. IMPRESSION: 1. Acute comminuted and displaced impacted right humeral head and neck fracture. 2. No acute intrathoracic, intra-abdominal, intrapelvic traumatic injury. 3. Interval development of vague superior endplate concavity of the T1 and T3 levels. Correlate with point tenderness to palpation to evaluate for an acute component. 4. Otherwise no definite acute fracture or traumatic malalignment of the thoracic or lumbar spine. 5. Mild pulmonary edema with moderate right and trace left pleural  effusions. 6. Bilateral staghorn calculi with largest on the left measuring 3 x 1.7 cm and 2.2 x 1.3 cm on the right. 7. Indeterminate 1.7 x 1.1 cm right hypodense lesion. 8. Aortic Atherosclerosis (ICD10-I70.0) and Emphysema (ICD10-J43.9). 9. Diffusely decreased bone density. Electronically Signed   By: Morgane  Naveau M.D.   On: 07/04/2023 17:19   DG Chest 1 View Result Date: 07/04/2023 CLINICAL DATA:  Fall. EXAM: CHEST  1 VIEW COMPARISON:  10/11/2021. FINDINGS: Hyperinflated lungs consistent with COPD. Small right-sided pleural effusion. Unremarkable cardiac silhouette. No pneumothorax. No focal consolidation. Normal pulmonary vasculature. Calcified aorta. Osseous structures are grossly intact. IMPRESSION: COPD. Small right-sided pleural effusion. Electronically Signed   By: Fonda Field M.D.   On: 07/04/2023 12:31   DG Wrist Complete Right Result Date: 07/04/2023 CLINICAL DATA:  Fall. EXAM: RIGHT WRIST - COMPLETE 3 VIEW COMPARISON:  None Available. FINDINGS: Osteopenia. Transverse fracture with dorsal angulation distal radius. Soft tissue swelling at the fracture site. Osseous structures are otherwise intact. IMPRESSION: Distal radius Colles fracture. Electronically Signed   By: Fonda Field M.D.   On: 07/04/2023 12:29   DG Shoulder Right Result Date: 07/04/2023 CLINICAL DATA:  Fall. EXAM: RIGHT SHOULDER - 3 VIEW COMPARISON:  None Available. FINDINGS: Osseous structures are osteopenic. Acromioclavicular degenerative changes with osteophytes. Fracture of the proximal humerus surgical neck with a few mm displacement. There is a associated soft tissue swelling. IMPRESSION: Proximal humerus fracture. Electronically Signed   By: Fonda Field M.D.   On: 07/04/2023 12:29   CT Head Wo Contrast Result Date: 07/04/2023 CLINICAL DATA:  Provided history: Neck trauma. Head trauma, minor. Additional history provided: Fall. Right shoulder swelling. EXAM: CT HEAD WITHOUT CONTRAST CT CERVICAL SPINE  WITHOUT CONTRAST TECHNIQUE: Multidetector CT imaging of the head and cervical spine was performed following the standard protocol without intravenous contrast. Multiplanar CT image reconstructions of the cervical spine were also generated. RADIATION DOSE REDUCTION: This exam was performed according to the departmental dose-optimization program which includes automated exposure control, adjustment of the mA and/or kV according to patient size and/or use of iterative reconstruction technique. COMPARISON:  Head CT 10/01/2021.  Cervical spine CT 10/18/2020. FINDINGS: CT HEAD FINDINGS Brain: Generalized cerebral atrophy. Chronic cortical/subcortical infarct again noted within the right frontal lobe/insula, right temporal lobe and right parietal lobe (MCA vascular territory). Background mild patchy and ill-defined hypoattenuation within the cerebral white matter, nonspecific but compatible with chronic small vessel disease. There is no acute intracranial hemorrhage. No acute demarcated cortical infarct. No extra-axial fluid collection. No evidence of an intracranial mass. No midline shift. Vascular: No hyperdense vessel.  Atherosclerotic calcifications. Skull: No calvarial fracture or aggressive osseous lesion. Sinuses/Orbits: No mass or acute finding within the imaged orbits. Left scleral buckle. Prior left ocular lens replacement. Mild mucosal thickening within the bilateral maxillary sinuses at the imaged levels. Moderate mucosal thickening within the right sphenoid sinus. Minimal mucosal thickening scattered within bilateral ethmoid air cells. Other: Right frontoparietal and temporal scalp hematoma. CT CERVICAL SPINE FINDINGS Alignment: No significant spondylolisthesis. Skull base and vertebrae: The basion-dental and atlanto-dental intervals are maintained.No evidence of acute fracture to the cervical spine. Prior C5-C6 ACDF. Solid vertebral body ankylosis at this level. T1 superior endplate vertebral compression  deformity (with 30-40% vertebral body height loss), new from the prior cervical spine CT of 10/18/2020 but otherwise age-indeterminate. Soft tissues and spinal canal: No prevertebral fluid or swelling. No visible canal hematoma. Prior right hemithyroidectomy. Disc levels: Cervical spondylosis with multilevel disc space narrowing, disc bulges/central disc protrusions and uncovertebral hypertrophy. Posterior disc osteophyte complex at C6-C7. Disc space narrowing is greatest at C6-C7 level (advanced at this level). No appreciable high-grade spinal canal stenosis. Multilevel bony neural foraminal narrowing. Degenerative changes also present at the C1-C2 articulation. Upper chest: The right lung apex is excluded from the field of view. The left lung apex is only minimally included in the field of view. No visible pneumothorax. IMPRESSION: CT head: 1. No evidence of an acute intracranial abnormality. 2. Right frontoparietal and temporal scalp hematoma. 3. Known chronic right MCA territory cortical/subcortical infarct. 4. Background mild cerebral white matter chronic small vessel ischemic disease. 5. Generalized cerebral atrophy. 6. Mild paranasal sinus disease at the imaged levels as described. CT cervical spine: 1. T1 superior endplate vertebral compression fracture (30-40% vertebral body height loss), new from the prior cervical spine CT of 10/18/2020 but otherwise age-indeterminate. 2. No evidence of an acute cervical spine fracture. 3. Prior C5-C6 ACDF. No evidence of acute hardware compromise. 4. Cervical spondylosis as described. Electronically Signed   By: Rockey Childs D.O.   On: 07/04/2023 12:28   CT Cervical Spine Wo Contrast Result Date: 07/04/2023 CLINICAL DATA:  Provided history: Neck trauma. Head trauma, minor. Additional history provided: Fall. Right shoulder swelling. EXAM: CT HEAD WITHOUT CONTRAST CT CERVICAL SPINE WITHOUT CONTRAST TECHNIQUE: Multidetector CT imaging of the head and cervical spine was  performed following the standard protocol without intravenous contrast. Multiplanar CT image reconstructions of the cervical spine were also generated. RADIATION DOSE REDUCTION: This exam was performed according to the departmental dose-optimization program which includes  automated exposure control, adjustment of the mA and/or kV according to patient size and/or use of iterative reconstruction technique. COMPARISON:  Head CT 10/01/2021.  Cervical spine CT 10/18/2020. FINDINGS: CT HEAD FINDINGS Brain: Generalized cerebral atrophy. Chronic cortical/subcortical infarct again noted within the right frontal lobe/insula, right temporal lobe and right parietal lobe (MCA vascular territory). Background mild patchy and ill-defined hypoattenuation within the cerebral white matter, nonspecific but compatible with chronic small vessel disease. There is no acute intracranial hemorrhage. No acute demarcated cortical infarct. No extra-axial fluid collection. No evidence of an intracranial mass. No midline shift. Vascular: No hyperdense vessel.  Atherosclerotic calcifications. Skull: No calvarial fracture or aggressive osseous lesion. Sinuses/Orbits: No mass or acute finding within the imaged orbits. Left scleral buckle. Prior left ocular lens replacement. Mild mucosal thickening within the bilateral maxillary sinuses at the imaged levels. Moderate mucosal thickening within the right sphenoid sinus. Minimal mucosal thickening scattered within bilateral ethmoid air cells. Other: Right frontoparietal and temporal scalp hematoma. CT CERVICAL SPINE FINDINGS Alignment: No significant spondylolisthesis. Skull base and vertebrae: The basion-dental and atlanto-dental intervals are maintained.No evidence of acute fracture to the cervical spine. Prior C5-C6 ACDF. Solid vertebral body ankylosis at this level. T1 superior endplate vertebral compression deformity (with 30-40% vertebral body height loss), new from the prior cervical spine CT of  10/18/2020 but otherwise age-indeterminate. Soft tissues and spinal canal: No prevertebral fluid or swelling. No visible canal hematoma. Prior right hemithyroidectomy. Disc levels: Cervical spondylosis with multilevel disc space narrowing, disc bulges/central disc protrusions and uncovertebral hypertrophy. Posterior disc osteophyte complex at C6-C7. Disc space narrowing is greatest at C6-C7 level (advanced at this level). No appreciable high-grade spinal canal stenosis. Multilevel bony neural foraminal narrowing. Degenerative changes also present at the C1-C2 articulation. Upper chest: The right lung apex is excluded from the field of view. The left lung apex is only minimally included in the field of view. No visible pneumothorax. IMPRESSION: CT head: 1. No evidence of an acute intracranial abnormality. 2. Right frontoparietal and temporal scalp hematoma. 3. Known chronic right MCA territory cortical/subcortical infarct. 4. Background mild cerebral white matter chronic small vessel ischemic disease. 5. Generalized cerebral atrophy. 6. Mild paranasal sinus disease at the imaged levels as described. CT cervical spine: 1. T1 superior endplate vertebral compression fracture (30-40% vertebral body height loss), new from the prior cervical spine CT of 10/18/2020 but otherwise age-indeterminate. 2. No evidence of an acute cervical spine fracture. 3. Prior C5-C6 ACDF. No evidence of acute hardware compromise. 4. Cervical spondylosis as described. Electronically Signed   By: Rockey Childs D.O.   On: 07/04/2023 12:28    Labs: Basic Metabolic Panel: Recent Labs  Lab 07/04/23 1543 07/05/23 0348 07/06/23 0625  NA 138 137 140  K 3.3* 4.2 4.5  CL 108 110 111  CO2 21* 20* 23  GLUCOSE 114* 115* 92  BUN 18 17 27*  CREATININE 0.62 0.65 0.92  CALCIUM 8.1* 7.9* 7.9*   CBC: Recent Labs  Lab 07/04/23 1543 07/05/23 0348 07/06/23 0625 07/06/23 1535 07/11/23 0455  WBC 10.1 6.4 9.1  --  5.9  NEUTROABS 7.3  --   --    --   --   HGB 8.5* 7.3* 6.8* 8.9* 10.0*  HCT 27.9* 23.6* 22.7* 28.7* 32.6*  MCV 74.2* 73.5* 73.2*  --  78.4*  PLT 209 190 182  --  242   Microbiology: Results for orders placed or performed during the hospital encounter of 07/04/23  Urine Culture (for pregnant, neutropenic or  urologic patients or patients with an indwelling urinary catheter)     Status: Abnormal   Collection Time: 07/05/23  3:10 PM   Specimen: Urine, Clean Catch  Result Value Ref Range Status   Specimen Description   Final    URINE, CLEAN CATCH Performed at [facility], [address], [place], [place] [address]    Special Requests   Final    NONE Performed at [facility], [address] [place], [place] [address]    Culture >=100,000 COLONIES/mL PROTEUS MIRABILIS (A)  Final   Report Status 07/08/2023 FINAL  Final   Organism ID, Bacteria PROTEUS MIRABILIS (A)  Final      Susceptibility   Proteus mirabilis - MIC*    AMPICILLIN >=32 RESISTANT Resistant     CEFAZOLIN  8 SENSITIVE Sensitive     CEFEPIME <=0.12 SENSITIVE Sensitive     CEFTRIAXONE  <=0.25 SENSITIVE Sensitive     CIPROFLOXACIN  <=0.25 SENSITIVE Sensitive     GENTAMICIN  <=1 SENSITIVE Sensitive     IMIPENEM 1 SENSITIVE Sensitive     NITROFURANTOIN 128 RESISTANT Resistant     TRIMETH /SULFA  <=20 SENSITIVE Sensitive     AMPICILLIN/SULBACTAM 16 INTERMEDIATE Intermediate     PIP/TAZO <=4 SENSITIVE Sensitive ug/mL    * >=100,000 COLONIES/mL PROTEUS MIRABILIS   Time coordinating discharge: Over 30 minutes  Marien LITTIE Piety, MD  [facility] 07/11/2023, 12:28 PM

## 2023-07-11 NOTE — TOC Transition Note (Signed)
 Transition of Care [facility]) - Discharge Note   Patient Details  Name: HELIA HAESE MRN: [id number] Date of Birth: March 20, 1928  Transition of Care [facility]) CM/SW Contact:  Royanne JINNY Bernheim, RN Phone Number: 07/11/2023, 2:21 PM   Clinical Narrative:     Called son Tanda to notify that she will dc to [facility] room 3A today, left him a general VM asking for a call back. EMS called to transport she is 4th on list        Patient Goals and CMS Choice            Discharge Placement                       Discharge Plan and Services Additional resources added to the After Visit Summary for                                       Social Drivers of Health (SDOH) Interventions SDOH Screenings   Food Insecurity: Patient Unable To Answer (07/05/2023)  Housing: Patient Declined (07/05/2023)  Transportation Needs: Patient Unable To Answer (07/05/2023)  Utilities: Patient Unable To Answer (07/05/2023)  Depression (PHQ2-9): Low Risk  (10/29/2022)  Social Connections: Patient Unable To Answer (07/09/2023)  Tobacco Use: Medium Risk (07/04/2023)     Readmission Risk Interventions     No data to display

## 2023-07-11 NOTE — Plan of Care (Signed)
 Problem: Education: Goal: Knowledge of General Education information will improve Description: Including pain rating scale, medication(s)/side effects and non-pharmacologic comfort measures 07/11/2023 1305 by Joshua Kast, RN Outcome: Adequate for Discharge 07/11/2023 1305 by Joshua Kast, RN Outcome: Adequate for Discharge   Problem: Health Behavior/Discharge Planning: Goal: Ability to manage health-related needs will improve 07/11/2023 1305 by Joshua Kast, RN Outcome: Adequate for Discharge 07/11/2023 1305 by Joshua Kast, RN Outcome: Adequate for Discharge   Problem: Clinical Measurements: Goal: Ability to maintain clinical measurements within normal limits will improve 07/11/2023 1305 by Joshua Kast, RN Outcome: Adequate for Discharge 07/11/2023 1305 by Joshua Kast, RN Outcome: Adequate for Discharge Goal: Will remain free from infection 07/11/2023 1305 by Joshua Kast, RN Outcome: Adequate for Discharge 07/11/2023 1305 by Joshua Kast, RN Outcome: Adequate for Discharge Goal: Diagnostic test results will improve 07/11/2023 1305 by Joshua Kast, RN Outcome: Adequate for Discharge 07/11/2023 1305 by Joshua Kast, RN Outcome: Adequate for Discharge Goal: Respiratory complications will improve 07/11/2023 1305 by Joshua Kast, RN Outcome: Adequate for Discharge 07/11/2023 1305 by Joshua Kast, RN Outcome: Adequate for Discharge Goal: Cardiovascular complication will be avoided 07/11/2023 1305 by Joshua Kast, RN Outcome: Adequate for Discharge 07/11/2023 1305 by Joshua Kast, RN Outcome: Adequate for Discharge   Problem: Activity: Goal: Risk for activity intolerance will decrease 07/11/2023 1305 by Joshua Kast, RN Outcome: Adequate for Discharge 07/11/2023 1305 by Joshua Kast, RN Outcome: Adequate for Discharge   Problem: Nutrition: Goal: Adequate nutrition will be maintained 07/11/2023 1305 by Joshua Kast, RN Outcome:  Adequate for Discharge 07/11/2023 1305 by Joshua Kast, RN Outcome: Adequate for Discharge   Problem: Coping: Goal: Level of anxiety will decrease 07/11/2023 1305 by Joshua Kast, RN Outcome: Adequate for Discharge 07/11/2023 1305 by Joshua Kast, RN Outcome: Adequate for Discharge   Problem: Elimination: Goal: Will not experience complications related to bowel motility 07/11/2023 1305 by Joshua Kast, RN Outcome: Adequate for Discharge 07/11/2023 1305 by Joshua Kast, RN Outcome: Adequate for Discharge Goal: Will not experience complications related to urinary retention 07/11/2023 1305 by Joshua Kast, RN Outcome: Adequate for Discharge 07/11/2023 1305 by Joshua Kast, RN Outcome: Adequate for Discharge   Problem: Pain Management: Goal: General experience of comfort will improve 07/11/2023 1305 by Joshua Kast, RN Outcome: Adequate for Discharge 07/11/2023 1305 by Joshua Kast, RN Outcome: Adequate for Discharge   Problem: Safety: Goal: Ability to remain free from injury will improve 07/11/2023 1305 by Joshua Kast, RN Outcome: Adequate for Discharge 07/11/2023 1305 by Joshua Kast, RN Outcome: Adequate for Discharge   Problem: Skin Integrity: Goal: Risk for impaired skin integrity will decrease 07/11/2023 1305 by Joshua Kast, RN Outcome: Adequate for Discharge 07/11/2023 1305 by Joshua Kast, RN Outcome: Adequate for Discharge   Problem: Acute Rehab PT Goals(only PT should resolve) Goal: Pt Will Go Sit To Supine/Side 07/11/2023 1305 by Joshua Kast, RN Outcome: Adequate for Discharge 07/11/2023 1305 by Joshua Kast, RN Outcome: Adequate for Discharge Goal: Pt Will Transfer Bed To Chair/Chair To Bed 07/11/2023 1305 by Joshua Kast, RN Outcome: Adequate for Discharge 07/11/2023 1305 by Joshua Kast, RN Outcome: Adequate for Discharge Goal: Pt Will Ambulate 07/11/2023 1305 by Joshua Kast, RN Outcome: Adequate for  Discharge 07/11/2023 1305 by Joshua Kast, RN Outcome: Adequate for Discharge   Problem: Malnutrition  (NI-5.2) Goal: Food and/or nutrient delivery Description: Individualized approach for food/nutrient provision. 07/11/2023 1305 by Joshua Kast, RN Outcome: Adequate for Discharge 07/11/2023 1305 by Joshua Kast, RN Outcome: Adequate for Discharge   Problem: Acute Rehab OT  Goals (only OT should resolve) Goal: Pt. Will Perform Grooming 07/11/2023 1305 by Joshua Kast, RN Outcome: Adequate for Discharge 07/11/2023 1305 by Joshua Kast, RN Outcome: Adequate for Discharge Goal: Pt. Will Perform Lower Body Dressing 07/11/2023 1305 by Joshua Kast, RN Outcome: Adequate for Discharge 07/11/2023 1305 by Joshua Kast, RN Outcome: Adequate for Discharge Goal: Pt. Will Transfer To Toilet 07/11/2023 1305 by Joshua Kast, RN Outcome: Adequate for Discharge 07/11/2023 1305 by Joshua Kast, RN Outcome: Adequate for Discharge Goal: Pt. Will Perform Toileting-Clothing Manipulation 07/11/2023 1305 by Joshua Kast, RN Outcome: Adequate for Discharge 07/11/2023 1305 by Joshua Kast, RN Outcome: Adequate for Discharge

## 2023-07-11 NOTE — Plan of Care (Signed)
  Problem: Clinical Measurements: Goal: Ability to maintain clinical measurements within normal limits will improve Outcome: Progressing   Problem: Pain Management: Goal: General experience of comfort will improve Outcome: Progressing   Problem: Coping: Goal: Level of anxiety will decrease Outcome: Progressing   Problem: Safety: Goal: Ability to remain free from injury will improve Outcome: Progressing   Problem: Skin Integrity: Goal: Risk for impaired skin integrity will decrease Outcome: Progressing

## 2023-07-12 DIAGNOSIS — J309 Allergic rhinitis, unspecified: Secondary | ICD-10-CM | POA: Diagnosis not present

## 2023-07-12 DIAGNOSIS — F419 Anxiety disorder, unspecified: Secondary | ICD-10-CM | POA: Diagnosis not present

## 2023-07-12 DIAGNOSIS — I10 Essential (primary) hypertension: Secondary | ICD-10-CM | POA: Diagnosis not present

## 2023-07-12 DIAGNOSIS — K5900 Constipation, unspecified: Secondary | ICD-10-CM | POA: Diagnosis not present

## 2023-07-12 DIAGNOSIS — S42201D Unspecified fracture of upper end of right humerus, subsequent encounter for fracture with routine healing: Secondary | ICD-10-CM | POA: Diagnosis not present

## 2023-07-12 DIAGNOSIS — S52501D Unspecified fracture of the lower end of right radius, subsequent encounter for closed fracture with routine healing: Secondary | ICD-10-CM | POA: Diagnosis not present

## 2023-07-12 DIAGNOSIS — K219 Gastro-esophageal reflux disease without esophagitis: Secondary | ICD-10-CM | POA: Diagnosis not present

## 2023-07-12 DIAGNOSIS — R52 Pain, unspecified: Secondary | ICD-10-CM | POA: Diagnosis not present

## 2023-07-12 DIAGNOSIS — B029 Zoster without complications: Secondary | ICD-10-CM | POA: Diagnosis not present

## 2023-07-15 DIAGNOSIS — S52501D Unspecified fracture of the lower end of right radius, subsequent encounter for closed fracture with routine healing: Secondary | ICD-10-CM | POA: Diagnosis not present

## 2023-07-15 DIAGNOSIS — L89616 Pressure-induced deep tissue damage of right heel: Secondary | ICD-10-CM | POA: Diagnosis not present

## 2023-07-15 DIAGNOSIS — S42201D Unspecified fracture of upper end of right humerus, subsequent encounter for fracture with routine healing: Secondary | ICD-10-CM | POA: Diagnosis not present

## 2023-07-15 DIAGNOSIS — R52 Pain, unspecified: Secondary | ICD-10-CM | POA: Diagnosis not present

## 2023-07-15 DIAGNOSIS — E785 Hyperlipidemia, unspecified: Secondary | ICD-10-CM | POA: Diagnosis not present

## 2023-07-15 DIAGNOSIS — D649 Anemia, unspecified: Secondary | ICD-10-CM | POA: Diagnosis not present

## 2023-07-15 DIAGNOSIS — L89626 Pressure-induced deep tissue damage of left heel: Secondary | ICD-10-CM | POA: Diagnosis not present

## 2023-07-17 DIAGNOSIS — S42201D Unspecified fracture of upper end of right humerus, subsequent encounter for fracture with routine healing: Secondary | ICD-10-CM | POA: Diagnosis not present

## 2023-07-17 DIAGNOSIS — S52501D Unspecified fracture of the lower end of right radius, subsequent encounter for closed fracture with routine healing: Secondary | ICD-10-CM | POA: Diagnosis not present

## 2023-07-17 DIAGNOSIS — W19XXXA Unspecified fall, initial encounter: Secondary | ICD-10-CM | POA: Diagnosis not present

## 2023-07-17 DIAGNOSIS — I10 Essential (primary) hypertension: Secondary | ICD-10-CM | POA: Diagnosis not present

## 2023-07-17 DIAGNOSIS — K219 Gastro-esophageal reflux disease without esophagitis: Secondary | ICD-10-CM | POA: Diagnosis not present

## 2023-07-19 DIAGNOSIS — S42201D Unspecified fracture of upper end of right humerus, subsequent encounter for fracture with routine healing: Secondary | ICD-10-CM | POA: Diagnosis not present

## 2023-07-19 DIAGNOSIS — M6281 Muscle weakness (generalized): Secondary | ICD-10-CM | POA: Diagnosis not present

## 2023-07-19 DIAGNOSIS — S52501D Unspecified fracture of the lower end of right radius, subsequent encounter for closed fracture with routine healing: Secondary | ICD-10-CM | POA: Diagnosis not present

## 2023-07-19 DIAGNOSIS — R52 Pain, unspecified: Secondary | ICD-10-CM | POA: Diagnosis not present

## 2023-07-22 DIAGNOSIS — S42201D Unspecified fracture of upper end of right humerus, subsequent encounter for fracture with routine healing: Secondary | ICD-10-CM | POA: Diagnosis not present

## 2023-07-24 DIAGNOSIS — S42201D Unspecified fracture of upper end of right humerus, subsequent encounter for fracture with routine healing: Secondary | ICD-10-CM | POA: Diagnosis not present

## 2023-07-25 DIAGNOSIS — L89616 Pressure-induced deep tissue damage of right heel: Secondary | ICD-10-CM | POA: Diagnosis not present

## 2023-07-25 DIAGNOSIS — E785 Hyperlipidemia, unspecified: Secondary | ICD-10-CM | POA: Diagnosis not present

## 2023-07-25 DIAGNOSIS — L89626 Pressure-induced deep tissue damage of left heel: Secondary | ICD-10-CM | POA: Diagnosis not present

## 2023-07-25 DIAGNOSIS — D649 Anemia, unspecified: Secondary | ICD-10-CM | POA: Diagnosis not present

## 2023-07-29 DIAGNOSIS — S42201D Unspecified fracture of upper end of right humerus, subsequent encounter for fracture with routine healing: Secondary | ICD-10-CM | POA: Diagnosis not present

## 2023-07-31 DIAGNOSIS — S42201D Unspecified fracture of upper end of right humerus, subsequent encounter for fracture with routine healing: Secondary | ICD-10-CM | POA: Diagnosis not present

## 2023-07-31 DIAGNOSIS — M25531 Pain in right wrist: Secondary | ICD-10-CM | POA: Diagnosis not present

## 2023-07-31 DIAGNOSIS — S42291A Other displaced fracture of upper end of right humerus, initial encounter for closed fracture: Secondary | ICD-10-CM | POA: Diagnosis not present

## 2023-07-31 DIAGNOSIS — S52551A Other extraarticular fracture of lower end of right radius, initial encounter for closed fracture: Secondary | ICD-10-CM | POA: Diagnosis not present

## 2023-08-01 DIAGNOSIS — D649 Anemia, unspecified: Secondary | ICD-10-CM | POA: Diagnosis not present

## 2023-08-01 DIAGNOSIS — R300 Dysuria: Secondary | ICD-10-CM | POA: Diagnosis not present

## 2023-08-01 DIAGNOSIS — L89626 Pressure-induced deep tissue damage of left heel: Secondary | ICD-10-CM | POA: Diagnosis not present

## 2023-08-01 DIAGNOSIS — L89616 Pressure-induced deep tissue damage of right heel: Secondary | ICD-10-CM | POA: Diagnosis not present

## 2023-08-01 DIAGNOSIS — N390 Urinary tract infection, site not specified: Secondary | ICD-10-CM | POA: Diagnosis not present

## 2023-08-01 DIAGNOSIS — E785 Hyperlipidemia, unspecified: Secondary | ICD-10-CM | POA: Diagnosis not present

## 2023-08-01 DIAGNOSIS — H04123 Dry eye syndrome of bilateral lacrimal glands: Secondary | ICD-10-CM | POA: Diagnosis not present

## 2023-08-02 DIAGNOSIS — G4700 Insomnia, unspecified: Secondary | ICD-10-CM | POA: Diagnosis not present

## 2023-08-02 DIAGNOSIS — F4322 Adjustment disorder with anxiety: Secondary | ICD-10-CM | POA: Diagnosis not present

## 2023-08-02 DIAGNOSIS — R1310 Dysphagia, unspecified: Secondary | ICD-10-CM | POA: Diagnosis not present

## 2023-08-05 DIAGNOSIS — S42201D Unspecified fracture of upper end of right humerus, subsequent encounter for fracture with routine healing: Secondary | ICD-10-CM | POA: Diagnosis not present

## 2023-08-07 DIAGNOSIS — S42201D Unspecified fracture of upper end of right humerus, subsequent encounter for fracture with routine healing: Secondary | ICD-10-CM | POA: Diagnosis not present

## 2023-08-08 DIAGNOSIS — L89616 Pressure-induced deep tissue damage of right heel: Secondary | ICD-10-CM | POA: Diagnosis not present

## 2023-08-08 DIAGNOSIS — E785 Hyperlipidemia, unspecified: Secondary | ICD-10-CM | POA: Diagnosis not present

## 2023-08-08 DIAGNOSIS — L89626 Pressure-induced deep tissue damage of left heel: Secondary | ICD-10-CM | POA: Diagnosis not present

## 2023-08-08 DIAGNOSIS — D649 Anemia, unspecified: Secondary | ICD-10-CM | POA: Diagnosis not present

## 2023-08-11 DIAGNOSIS — R0602 Shortness of breath: Secondary | ICD-10-CM | POA: Diagnosis not present

## 2023-08-13 DIAGNOSIS — I6930 Unspecified sequelae of cerebral infarction: Secondary | ICD-10-CM | POA: Diagnosis not present

## 2023-08-13 DIAGNOSIS — Z515 Encounter for palliative care: Secondary | ICD-10-CM | POA: Diagnosis not present

## 2023-08-13 DIAGNOSIS — R54 Age-related physical debility: Secondary | ICD-10-CM | POA: Diagnosis not present

## 2023-08-13 DIAGNOSIS — S42291G Other displaced fracture of upper end of right humerus, subsequent encounter for fracture with delayed healing: Secondary | ICD-10-CM | POA: Diagnosis not present

## 2023-08-13 DIAGNOSIS — J101 Influenza due to other identified influenza virus with other respiratory manifestations: Secondary | ICD-10-CM | POA: Diagnosis not present

## 2023-08-13 DIAGNOSIS — I5032 Chronic diastolic (congestive) heart failure: Secondary | ICD-10-CM | POA: Diagnosis not present

## 2023-08-13 DIAGNOSIS — E640 Sequelae of protein-calorie malnutrition: Secondary | ICD-10-CM | POA: Diagnosis not present

## 2023-08-13 DIAGNOSIS — R630 Anorexia: Secondary | ICD-10-CM | POA: Diagnosis not present

## 2023-08-15 DIAGNOSIS — L89616 Pressure-induced deep tissue damage of right heel: Secondary | ICD-10-CM | POA: Diagnosis not present

## 2023-08-15 DIAGNOSIS — L89626 Pressure-induced deep tissue damage of left heel: Secondary | ICD-10-CM | POA: Diagnosis not present

## 2023-08-15 DIAGNOSIS — D649 Anemia, unspecified: Secondary | ICD-10-CM | POA: Diagnosis not present

## 2023-08-15 DIAGNOSIS — E785 Hyperlipidemia, unspecified: Secondary | ICD-10-CM | POA: Diagnosis not present

## 2023-08-19 DIAGNOSIS — L89626 Pressure-induced deep tissue damage of left heel: Secondary | ICD-10-CM | POA: Diagnosis not present

## 2023-08-19 DIAGNOSIS — D649 Anemia, unspecified: Secondary | ICD-10-CM | POA: Diagnosis not present

## 2023-08-19 DIAGNOSIS — E785 Hyperlipidemia, unspecified: Secondary | ICD-10-CM | POA: Diagnosis not present

## 2023-08-19 DIAGNOSIS — L89616 Pressure-induced deep tissue damage of right heel: Secondary | ICD-10-CM | POA: Diagnosis not present

## 2023-08-21 DIAGNOSIS — R54 Age-related physical debility: Secondary | ICD-10-CM | POA: Diagnosis not present

## 2023-08-21 DIAGNOSIS — R630 Anorexia: Secondary | ICD-10-CM | POA: Diagnosis not present

## 2023-08-21 DIAGNOSIS — Z515 Encounter for palliative care: Secondary | ICD-10-CM | POA: Diagnosis not present

## 2023-08-21 DIAGNOSIS — E640 Sequelae of protein-calorie malnutrition: Secondary | ICD-10-CM | POA: Diagnosis not present

## 2023-08-21 DIAGNOSIS — I6930 Unspecified sequelae of cerebral infarction: Secondary | ICD-10-CM | POA: Diagnosis not present

## 2023-08-21 DIAGNOSIS — I5032 Chronic diastolic (congestive) heart failure: Secondary | ICD-10-CM | POA: Diagnosis not present

## 2023-08-30 DIAGNOSIS — E785 Hyperlipidemia, unspecified: Secondary | ICD-10-CM | POA: Diagnosis not present

## 2023-08-30 DIAGNOSIS — L89616 Pressure-induced deep tissue damage of right heel: Secondary | ICD-10-CM | POA: Diagnosis not present

## 2023-08-30 DIAGNOSIS — D649 Anemia, unspecified: Secondary | ICD-10-CM | POA: Diagnosis not present

## 2023-08-30 DIAGNOSIS — L89626 Pressure-induced deep tissue damage of left heel: Secondary | ICD-10-CM | POA: Diagnosis not present

## 2023-08-31 DIAGNOSIS — R52 Pain, unspecified: Secondary | ICD-10-CM | POA: Diagnosis not present

## 2023-08-31 DIAGNOSIS — N390 Urinary tract infection, site not specified: Secondary | ICD-10-CM | POA: Diagnosis not present

## 2023-09-02 DIAGNOSIS — L89616 Pressure-induced deep tissue damage of right heel: Secondary | ICD-10-CM | POA: Diagnosis not present

## 2023-09-02 DIAGNOSIS — L89626 Pressure-induced deep tissue damage of left heel: Secondary | ICD-10-CM | POA: Diagnosis not present

## 2023-09-02 DIAGNOSIS — D649 Anemia, unspecified: Secondary | ICD-10-CM | POA: Diagnosis not present

## 2023-09-02 DIAGNOSIS — E785 Hyperlipidemia, unspecified: Secondary | ICD-10-CM | POA: Diagnosis not present

## 2023-09-03 DIAGNOSIS — G4700 Insomnia, unspecified: Secondary | ICD-10-CM | POA: Diagnosis not present

## 2023-09-03 DIAGNOSIS — R1310 Dysphagia, unspecified: Secondary | ICD-10-CM | POA: Diagnosis not present

## 2023-09-03 DIAGNOSIS — F4322 Adjustment disorder with anxiety: Secondary | ICD-10-CM | POA: Diagnosis not present

## 2023-09-04 DIAGNOSIS — R300 Dysuria: Secondary | ICD-10-CM | POA: Diagnosis not present

## 2023-09-09 DIAGNOSIS — M6281 Muscle weakness (generalized): Secondary | ICD-10-CM | POA: Diagnosis not present

## 2023-09-09 DIAGNOSIS — R296 Repeated falls: Secondary | ICD-10-CM | POA: Diagnosis not present

## 2023-09-09 DIAGNOSIS — J449 Chronic obstructive pulmonary disease, unspecified: Secondary | ICD-10-CM | POA: Diagnosis not present

## 2023-09-09 DIAGNOSIS — I10 Essential (primary) hypertension: Secondary | ICD-10-CM | POA: Diagnosis not present

## 2023-09-13 DIAGNOSIS — L89626 Pressure-induced deep tissue damage of left heel: Secondary | ICD-10-CM | POA: Diagnosis not present

## 2023-09-13 DIAGNOSIS — L89616 Pressure-induced deep tissue damage of right heel: Secondary | ICD-10-CM | POA: Diagnosis not present

## 2023-09-13 DIAGNOSIS — E785 Hyperlipidemia, unspecified: Secondary | ICD-10-CM | POA: Diagnosis not present

## 2023-09-13 DIAGNOSIS — D649 Anemia, unspecified: Secondary | ICD-10-CM | POA: Diagnosis not present

## 2023-09-16 DIAGNOSIS — L89616 Pressure-induced deep tissue damage of right heel: Secondary | ICD-10-CM | POA: Diagnosis not present

## 2023-09-16 DIAGNOSIS — L89626 Pressure-induced deep tissue damage of left heel: Secondary | ICD-10-CM | POA: Diagnosis not present

## 2023-09-16 DIAGNOSIS — E785 Hyperlipidemia, unspecified: Secondary | ICD-10-CM | POA: Diagnosis not present

## 2023-09-16 DIAGNOSIS — D649 Anemia, unspecified: Secondary | ICD-10-CM | POA: Diagnosis not present

## 2023-09-20 DIAGNOSIS — R197 Diarrhea, unspecified: Secondary | ICD-10-CM | POA: Diagnosis not present

## 2023-09-25 DIAGNOSIS — E640 Sequelae of protein-calorie malnutrition: Secondary | ICD-10-CM | POA: Diagnosis not present

## 2023-09-25 DIAGNOSIS — I5032 Chronic diastolic (congestive) heart failure: Secondary | ICD-10-CM | POA: Diagnosis not present

## 2023-09-25 DIAGNOSIS — G4700 Insomnia, unspecified: Secondary | ICD-10-CM | POA: Diagnosis not present

## 2023-09-25 DIAGNOSIS — I6930 Unspecified sequelae of cerebral infarction: Secondary | ICD-10-CM | POA: Diagnosis not present

## 2023-09-25 DIAGNOSIS — R1310 Dysphagia, unspecified: Secondary | ICD-10-CM | POA: Diagnosis not present

## 2023-09-25 DIAGNOSIS — R630 Anorexia: Secondary | ICD-10-CM | POA: Diagnosis not present

## 2023-09-25 DIAGNOSIS — Z515 Encounter for palliative care: Secondary | ICD-10-CM | POA: Diagnosis not present

## 2023-09-25 DIAGNOSIS — F4322 Adjustment disorder with anxiety: Secondary | ICD-10-CM | POA: Diagnosis not present

## 2023-09-25 DIAGNOSIS — R54 Age-related physical debility: Secondary | ICD-10-CM | POA: Diagnosis not present

## 2023-09-26 DIAGNOSIS — H9201 Otalgia, right ear: Secondary | ICD-10-CM | POA: Diagnosis not present

## 2023-09-26 DIAGNOSIS — H6121 Impacted cerumen, right ear: Secondary | ICD-10-CM | POA: Diagnosis not present

## 2023-09-26 DIAGNOSIS — E785 Hyperlipidemia, unspecified: Secondary | ICD-10-CM | POA: Diagnosis not present

## 2023-09-26 DIAGNOSIS — H6122 Impacted cerumen, left ear: Secondary | ICD-10-CM | POA: Diagnosis not present

## 2023-09-26 DIAGNOSIS — D649 Anemia, unspecified: Secondary | ICD-10-CM | POA: Diagnosis not present

## 2023-09-26 DIAGNOSIS — L89616 Pressure-induced deep tissue damage of right heel: Secondary | ICD-10-CM | POA: Diagnosis not present

## 2023-09-26 DIAGNOSIS — L89626 Pressure-induced deep tissue damage of left heel: Secondary | ICD-10-CM | POA: Diagnosis not present

## 2023-09-27 DIAGNOSIS — L97509 Non-pressure chronic ulcer of other part of unspecified foot with unspecified severity: Secondary | ICD-10-CM | POA: Diagnosis not present

## 2023-09-30 DIAGNOSIS — H6122 Impacted cerumen, left ear: Secondary | ICD-10-CM | POA: Diagnosis not present

## 2023-10-01 DIAGNOSIS — Z9181 History of falling: Secondary | ICD-10-CM | POA: Diagnosis not present

## 2023-10-01 DIAGNOSIS — H6122 Impacted cerumen, left ear: Secondary | ICD-10-CM | POA: Diagnosis not present

## 2023-10-01 DIAGNOSIS — H6121 Impacted cerumen, right ear: Secondary | ICD-10-CM | POA: Diagnosis not present

## 2023-10-01 DIAGNOSIS — M6281 Muscle weakness (generalized): Secondary | ICD-10-CM | POA: Diagnosis not present

## 2023-10-03 DIAGNOSIS — E785 Hyperlipidemia, unspecified: Secondary | ICD-10-CM | POA: Diagnosis not present

## 2023-10-03 DIAGNOSIS — L89616 Pressure-induced deep tissue damage of right heel: Secondary | ICD-10-CM | POA: Diagnosis not present

## 2023-10-03 DIAGNOSIS — L89626 Pressure-induced deep tissue damage of left heel: Secondary | ICD-10-CM | POA: Diagnosis not present

## 2023-10-03 DIAGNOSIS — D649 Anemia, unspecified: Secondary | ICD-10-CM | POA: Diagnosis not present

## 2023-10-04 DIAGNOSIS — Z515 Encounter for palliative care: Secondary | ICD-10-CM | POA: Diagnosis not present

## 2023-10-04 DIAGNOSIS — E640 Sequelae of protein-calorie malnutrition: Secondary | ICD-10-CM | POA: Diagnosis not present

## 2023-10-04 DIAGNOSIS — I5032 Chronic diastolic (congestive) heart failure: Secondary | ICD-10-CM | POA: Diagnosis not present

## 2023-10-04 DIAGNOSIS — R630 Anorexia: Secondary | ICD-10-CM | POA: Diagnosis not present

## 2023-10-04 DIAGNOSIS — I6930 Unspecified sequelae of cerebral infarction: Secondary | ICD-10-CM | POA: Diagnosis not present

## 2023-10-04 DIAGNOSIS — R54 Age-related physical debility: Secondary | ICD-10-CM | POA: Diagnosis not present

## 2023-10-09 DIAGNOSIS — R296 Repeated falls: Secondary | ICD-10-CM | POA: Diagnosis not present

## 2023-10-09 DIAGNOSIS — I10 Essential (primary) hypertension: Secondary | ICD-10-CM | POA: Diagnosis not present

## 2023-10-09 DIAGNOSIS — R531 Weakness: Secondary | ICD-10-CM | POA: Diagnosis not present

## 2023-10-09 DIAGNOSIS — K5900 Constipation, unspecified: Secondary | ICD-10-CM | POA: Diagnosis not present

## 2023-10-10 DIAGNOSIS — L89626 Pressure-induced deep tissue damage of left heel: Secondary | ICD-10-CM | POA: Diagnosis not present

## 2023-10-10 DIAGNOSIS — D649 Anemia, unspecified: Secondary | ICD-10-CM | POA: Diagnosis not present

## 2023-10-10 DIAGNOSIS — H938X1 Other specified disorders of right ear: Secondary | ICD-10-CM | POA: Diagnosis not present

## 2023-10-10 DIAGNOSIS — L89616 Pressure-induced deep tissue damage of right heel: Secondary | ICD-10-CM | POA: Diagnosis not present

## 2023-10-10 DIAGNOSIS — E785 Hyperlipidemia, unspecified: Secondary | ICD-10-CM | POA: Diagnosis not present

## 2023-10-15 DIAGNOSIS — H938X1 Other specified disorders of right ear: Secondary | ICD-10-CM | POA: Diagnosis not present

## 2023-10-15 DIAGNOSIS — H9191 Unspecified hearing loss, right ear: Secondary | ICD-10-CM | POA: Diagnosis not present

## 2023-10-15 DIAGNOSIS — Z8669 Personal history of other diseases of the nervous system and sense organs: Secondary | ICD-10-CM | POA: Diagnosis not present

## 2023-10-24 DIAGNOSIS — I6930 Unspecified sequelae of cerebral infarction: Secondary | ICD-10-CM | POA: Diagnosis not present

## 2023-10-24 DIAGNOSIS — Z515 Encounter for palliative care: Secondary | ICD-10-CM | POA: Diagnosis not present

## 2023-10-24 DIAGNOSIS — I5032 Chronic diastolic (congestive) heart failure: Secondary | ICD-10-CM | POA: Diagnosis not present

## 2023-10-24 DIAGNOSIS — E640 Sequelae of protein-calorie malnutrition: Secondary | ICD-10-CM | POA: Diagnosis not present

## 2023-10-24 DIAGNOSIS — R54 Age-related physical debility: Secondary | ICD-10-CM | POA: Diagnosis not present

## 2023-10-24 DIAGNOSIS — R630 Anorexia: Secondary | ICD-10-CM | POA: Diagnosis not present

## 2023-10-25 DIAGNOSIS — F5104 Psychophysiologic insomnia: Secondary | ICD-10-CM | POA: Diagnosis not present

## 2023-10-30 DIAGNOSIS — R531 Weakness: Secondary | ICD-10-CM | POA: Diagnosis not present

## 2023-10-30 DIAGNOSIS — J449 Chronic obstructive pulmonary disease, unspecified: Secondary | ICD-10-CM | POA: Diagnosis not present

## 2023-10-30 DIAGNOSIS — R296 Repeated falls: Secondary | ICD-10-CM | POA: Diagnosis not present

## 2023-10-30 DIAGNOSIS — I10 Essential (primary) hypertension: Secondary | ICD-10-CM | POA: Diagnosis not present

## 2023-10-31 DIAGNOSIS — H6502 Acute serous otitis media, left ear: Secondary | ICD-10-CM | POA: Diagnosis not present

## 2023-10-31 DIAGNOSIS — H90A32 Mixed conductive and sensorineural hearing loss, unilateral, left ear with restricted hearing on the contralateral side: Secondary | ICD-10-CM | POA: Diagnosis not present

## 2023-11-04 DIAGNOSIS — K649 Unspecified hemorrhoids: Secondary | ICD-10-CM | POA: Diagnosis not present

## 2023-11-04 DIAGNOSIS — R208 Other disturbances of skin sensation: Secondary | ICD-10-CM | POA: Diagnosis not present

## 2023-11-06 DIAGNOSIS — F419 Anxiety disorder, unspecified: Secondary | ICD-10-CM | POA: Diagnosis not present

## 2023-11-06 DIAGNOSIS — R296 Repeated falls: Secondary | ICD-10-CM | POA: Diagnosis not present

## 2023-11-06 DIAGNOSIS — J449 Chronic obstructive pulmonary disease, unspecified: Secondary | ICD-10-CM | POA: Diagnosis not present

## 2023-11-06 DIAGNOSIS — R531 Weakness: Secondary | ICD-10-CM | POA: Diagnosis not present

## 2023-11-06 DIAGNOSIS — I10 Essential (primary) hypertension: Secondary | ICD-10-CM | POA: Diagnosis not present

## 2023-11-11 DIAGNOSIS — M79662 Pain in left lower leg: Secondary | ICD-10-CM | POA: Diagnosis not present

## 2023-11-11 DIAGNOSIS — M79661 Pain in right lower leg: Secondary | ICD-10-CM | POA: Diagnosis not present

## 2023-11-11 DIAGNOSIS — R52 Pain, unspecified: Secondary | ICD-10-CM | POA: Diagnosis not present

## 2023-11-11 DIAGNOSIS — M1990 Unspecified osteoarthritis, unspecified site: Secondary | ICD-10-CM | POA: Diagnosis not present

## 2023-11-12 DIAGNOSIS — M79662 Pain in left lower leg: Secondary | ICD-10-CM | POA: Diagnosis not present

## 2023-11-12 DIAGNOSIS — M1990 Unspecified osteoarthritis, unspecified site: Secondary | ICD-10-CM | POA: Diagnosis not present

## 2023-11-12 DIAGNOSIS — M79661 Pain in right lower leg: Secondary | ICD-10-CM | POA: Diagnosis not present

## 2023-11-12 DIAGNOSIS — R52 Pain, unspecified: Secondary | ICD-10-CM | POA: Diagnosis not present

## 2023-11-13 DIAGNOSIS — R52 Pain, unspecified: Secondary | ICD-10-CM | POA: Diagnosis not present

## 2023-11-13 DIAGNOSIS — M79662 Pain in left lower leg: Secondary | ICD-10-CM | POA: Diagnosis not present

## 2023-11-13 DIAGNOSIS — M1990 Unspecified osteoarthritis, unspecified site: Secondary | ICD-10-CM | POA: Diagnosis not present

## 2023-11-13 DIAGNOSIS — M79661 Pain in right lower leg: Secondary | ICD-10-CM | POA: Diagnosis not present

## 2023-11-14 DIAGNOSIS — R630 Anorexia: Secondary | ICD-10-CM | POA: Diagnosis not present

## 2023-11-14 DIAGNOSIS — R52 Pain, unspecified: Secondary | ICD-10-CM | POA: Diagnosis not present

## 2023-11-14 DIAGNOSIS — E640 Sequelae of protein-calorie malnutrition: Secondary | ICD-10-CM | POA: Diagnosis not present

## 2023-11-14 DIAGNOSIS — I6930 Unspecified sequelae of cerebral infarction: Secondary | ICD-10-CM | POA: Diagnosis not present

## 2023-11-14 DIAGNOSIS — M79662 Pain in left lower leg: Secondary | ICD-10-CM | POA: Diagnosis not present

## 2023-11-14 DIAGNOSIS — M79661 Pain in right lower leg: Secondary | ICD-10-CM | POA: Diagnosis not present

## 2023-11-14 DIAGNOSIS — I5032 Chronic diastolic (congestive) heart failure: Secondary | ICD-10-CM | POA: Diagnosis not present

## 2023-11-14 DIAGNOSIS — M1990 Unspecified osteoarthritis, unspecified site: Secondary | ICD-10-CM | POA: Diagnosis not present

## 2023-11-14 DIAGNOSIS — R54 Age-related physical debility: Secondary | ICD-10-CM | POA: Diagnosis not present

## 2023-11-14 DIAGNOSIS — Z515 Encounter for palliative care: Secondary | ICD-10-CM | POA: Diagnosis not present

## 2023-11-18 ENCOUNTER — Emergency Department
Admission: EM | Admit: 2023-11-18 | Discharge: 2023-11-18 | Disposition: A | Discharge status: Home / Self Care | Attending: Emergency Medicine | Admitting: Emergency Medicine

## 2023-11-18 ENCOUNTER — Emergency Department

## 2023-11-18 ENCOUNTER — Other Ambulatory Visit: Payer: Self-pay

## 2023-11-18 DIAGNOSIS — K219 Gastro-esophageal reflux disease without esophagitis: Secondary | ICD-10-CM | POA: Diagnosis not present

## 2023-11-18 DIAGNOSIS — I110 Hypertensive heart disease with heart failure: Secondary | ICD-10-CM | POA: Diagnosis not present

## 2023-11-18 DIAGNOSIS — K529 Noninfective gastroenteritis and colitis, unspecified: Secondary | ICD-10-CM | POA: Diagnosis not present

## 2023-11-18 DIAGNOSIS — N281 Cyst of kidney, acquired: Secondary | ICD-10-CM | POA: Diagnosis not present

## 2023-11-18 DIAGNOSIS — R002 Palpitations: Secondary | ICD-10-CM | POA: Diagnosis not present

## 2023-11-18 DIAGNOSIS — F419 Anxiety disorder, unspecified: Secondary | ICD-10-CM | POA: Diagnosis not present

## 2023-11-18 DIAGNOSIS — R9431 Abnormal electrocardiogram [ECG] [EKG]: Secondary | ICD-10-CM | POA: Diagnosis not present

## 2023-11-18 DIAGNOSIS — R1084 Generalized abdominal pain: Secondary | ICD-10-CM | POA: Diagnosis not present

## 2023-11-18 DIAGNOSIS — R079 Chest pain, unspecified: Secondary | ICD-10-CM | POA: Diagnosis not present

## 2023-11-18 DIAGNOSIS — K8020 Calculus of gallbladder without cholecystitis without obstruction: Secondary | ICD-10-CM | POA: Diagnosis not present

## 2023-11-18 DIAGNOSIS — I5032 Chronic diastolic (congestive) heart failure: Secondary | ICD-10-CM | POA: Insufficient documentation

## 2023-11-18 DIAGNOSIS — K8070 Calculus of gallbladder and bile duct without cholecystitis without obstruction: Secondary | ICD-10-CM | POA: Diagnosis not present

## 2023-11-18 DIAGNOSIS — R918 Other nonspecific abnormal finding of lung field: Secondary | ICD-10-CM | POA: Diagnosis not present

## 2023-11-18 DIAGNOSIS — I5030 Unspecified diastolic (congestive) heart failure: Secondary | ICD-10-CM | POA: Insufficient documentation

## 2023-11-18 DIAGNOSIS — N3001 Acute cystitis with hematuria: Secondary | ICD-10-CM | POA: Insufficient documentation

## 2023-11-18 DIAGNOSIS — J90 Pleural effusion, not elsewhere classified: Secondary | ICD-10-CM | POA: Diagnosis not present

## 2023-11-18 DIAGNOSIS — N289 Disorder of kidney and ureter, unspecified: Secondary | ICD-10-CM | POA: Diagnosis not present

## 2023-11-18 DIAGNOSIS — F5104 Psychophysiologic insomnia: Secondary | ICD-10-CM | POA: Diagnosis not present

## 2023-11-18 DIAGNOSIS — R197 Diarrhea, unspecified: Secondary | ICD-10-CM | POA: Diagnosis present

## 2023-11-18 DIAGNOSIS — I10 Essential (primary) hypertension: Secondary | ICD-10-CM | POA: Diagnosis not present

## 2023-11-18 DIAGNOSIS — N200 Calculus of kidney: Secondary | ICD-10-CM | POA: Diagnosis not present

## 2023-11-18 DIAGNOSIS — K5989 Other specified functional intestinal disorders: Secondary | ICD-10-CM | POA: Diagnosis not present

## 2023-11-18 DIAGNOSIS — R0989 Other specified symptoms and signs involving the circulatory and respiratory systems: Secondary | ICD-10-CM | POA: Diagnosis not present

## 2023-11-18 DIAGNOSIS — I213 ST elevation (STEMI) myocardial infarction of unspecified site: Secondary | ICD-10-CM | POA: Diagnosis not present

## 2023-11-18 DIAGNOSIS — R000 Tachycardia, unspecified: Secondary | ICD-10-CM | POA: Diagnosis not present

## 2023-11-18 LAB — CBC WITH DIFFERENTIAL/PLATELET
Abs Immature Granulocytes: 0.06 10*3/uL (ref 0.00–0.07)
Basophils Absolute: 0.1 10*3/uL (ref 0.0–0.1)
Basophils Relative: 1 %
Eosinophils Absolute: 0 10*3/uL (ref 0.0–0.5)
Eosinophils Relative: 0 %
HCT: 37.3 % (ref 36.0–46.0)
Hemoglobin: 11.9 g/dL — ABNORMAL LOW (ref 12.0–15.0)
Immature Granulocytes: 1 %
Lymphocytes Relative: 22 %
Lymphs Abs: 1.9 10*3/uL (ref 0.7–4.0)
MCH: 32.4 pg (ref 26.0–34.0)
MCHC: 31.9 g/dL (ref 30.0–36.0)
MCV: 101.6 fL — ABNORMAL HIGH (ref 80.0–100.0)
Monocytes Absolute: 1 10*3/uL (ref 0.1–1.0)
Monocytes Relative: 12 %
Neutro Abs: 5.5 10*3/uL (ref 1.7–7.7)
Neutrophils Relative %: 64 %
Platelets: 292 10*3/uL (ref 150–400)
RBC: 3.67 MIL/uL — ABNORMAL LOW (ref 3.87–5.11)
RDW: 13 % (ref 11.5–15.5)
WBC: 8.5 10*3/uL (ref 4.0–10.5)
nRBC: 0 % (ref 0.0–0.2)

## 2023-11-18 LAB — COMPREHENSIVE METABOLIC PANEL WITH GFR
ALT: 19 U/L (ref 0–44)
AST: 21 U/L (ref 15–41)
Albumin: 3.7 g/dL (ref 3.5–5.0)
Alkaline Phosphatase: 82 U/L (ref 38–126)
Anion gap: 10 (ref 5–15)
BUN: 25 mg/dL — ABNORMAL HIGH (ref 8–23)
CO2: 22 mmol/L (ref 22–32)
Calcium: 8.5 mg/dL — ABNORMAL LOW (ref 8.9–10.3)
Chloride: 103 mmol/L (ref 98–111)
Creatinine, Ser: 0.87 mg/dL (ref 0.44–1.00)
GFR, Estimated: >60 mL/min (ref >60)
Glucose, Bld: 133 mg/dL — ABNORMAL HIGH (ref 70–99)
Potassium: 3.8 mmol/L (ref 3.5–5.1)
Sodium: 135 mmol/L (ref 135–145)
Total Bilirubin: 0.5 mg/dL (ref 0.0–1.2)
Total Protein: 7.3 g/dL (ref 6.5–8.1)

## 2023-11-18 LAB — URINALYSIS, ROUTINE W REFLEX MICROSCOPIC
Bilirubin Urine: NEGATIVE
Glucose, UA: NEGATIVE mg/dL
Hgb urine dipstick: NEGATIVE
Ketones, ur: NEGATIVE mg/dL
Nitrite: NEGATIVE
Protein, ur: 30 mg/dL — AB
Specific Gravity, Urine: 1.012 (ref 1.005–1.030)
pH: 5 (ref 5.0–8.0)

## 2023-11-18 LAB — RESP PANEL BY RT-PCR (RSV, FLU A&B, COVID)  RVPGX2
Influenza A by PCR: NEGATIVE
Influenza B by PCR: NEGATIVE
Resp Syncytial Virus by PCR: NEGATIVE
SARS Coronavirus 2 by RT PCR: NEGATIVE

## 2023-11-18 LAB — TROPONIN I (HIGH SENSITIVITY)
Troponin I (High Sensitivity): 14 ng/L (ref <18)
Troponin I (High Sensitivity): 17 ng/L (ref <18)

## 2023-11-18 LAB — LIPASE, BLOOD: Lipase: 49 U/L (ref 11–51)

## 2023-11-18 MED ORDER — ONDANSETRON HCL 4 MG/2ML IJ SOLN
4.0000 mg | Freq: Once | INTRAMUSCULAR | Status: AC
  Administered 2023-11-18: 4 mg via INTRAVENOUS
  Filled 2023-11-18: qty 2

## 2023-11-18 MED ORDER — FENTANYL CITRATE PF 50 MCG/ML IJ SOSY
50.0000 ug | PREFILLED_SYRINGE | Freq: Once | INTRAMUSCULAR | Status: AC
  Administered 2023-11-18: 50 ug via INTRAVENOUS
  Filled 2023-11-18: qty 1

## 2023-11-18 MED ORDER — KETOROLAC TROMETHAMINE 15 MG/ML IJ SOLN: 15.0000 mg | Freq: Once | INTRAMUSCULAR | Status: DC

## 2023-11-18 MED ORDER — LACTATED RINGERS IV BOLUS
1000.0000 mL | Freq: Once | INTRAVENOUS | Status: AC
  Administered 2023-11-18: 1000 mL via INTRAVENOUS

## 2023-11-18 MED ORDER — ALUM & MAG HYDROXIDE-SIMETH 200-200-20 MG/5ML PO SUSP
30.0000 mL | Freq: Once | ORAL | Status: AC
  Administered 2023-11-18: 30 mL via ORAL
  Filled 2023-11-18: qty 30

## 2023-11-18 MED ORDER — LIDOCAINE VISCOUS HCL 2 % MT SOLN
15.0000 mL | Freq: Once | OROMUCOSAL | Status: AC
  Administered 2023-11-18: 15 mL via OROMUCOSAL
  Filled 2023-11-18: qty 15

## 2023-11-18 MED ORDER — MORPHINE SULFATE (PF) 4 MG/ML IV SOLN
4.0000 mg | Freq: Once | INTRAVENOUS | Status: AC
  Administered 2023-11-18: 4 mg via INTRAVENOUS
  Filled 2023-11-18: qty 1

## 2023-11-18 MED ORDER — CEFUROXIME AXETIL 500 MG PO TABS
500.0000 mg | ORAL_TABLET | Freq: Two times a day (BID) | ORAL | 0 refills | Status: AC
Start: 2023-11-18 — End: 2023-11-23

## 2023-11-18 MED ORDER — ACETAMINOPHEN 325 MG PO TABS
650.0000 mg | ORAL_TABLET | Freq: Once | ORAL | Status: AC
  Administered 2023-11-18: 650 mg via ORAL
  Filled 2023-11-18: qty 2

## 2023-11-18 MED ORDER — IOHEXOL 300 MG/ML  SOLN
75.0000 mL | Freq: Once | INTRAMUSCULAR | Status: AC | PRN
  Administered 2023-11-18: 75 mL via INTRAVENOUS

## 2023-11-18 MED ORDER — SODIUM CHLORIDE 0.9 % IV SOLN
1.0000 g | Freq: Once | INTRAVENOUS | Status: AC
  Administered 2023-11-18: 1 g via INTRAVENOUS
  Filled 2023-11-18: qty 10

## 2023-11-18 NOTE — ED Triage Notes (Signed)
 Pt arrives via [facility] from [facility] with c/o CP that started last night with pain that is radiating to their back and down their left arm. EMS gave 324 asa en route and facility gave pt metoporol because pt HR was 160s.

## 2023-11-18 NOTE — ED Notes (Signed)
 Called to [facility] @934PM  Per RN Richard/Transport to [employer]

## 2023-11-18 NOTE — ED Notes (Signed)
 Called [facility] to add pt to their monitoring.

## 2023-11-18 NOTE — Discharge Instructions (Addendum)
 Her workup today showed evidence of mild colitis throughout her bowels as well as possible UTI.  I have sent antibiotics with a paper prescription for her to take to treat her symptoms.  Make sure she is staying hydrated.  Workup of her chest and heart otherwise is reassuring.  Please return for any severe worsening symptoms.  Have her follow-up with her primary care provider in a couple days for reassessment.

## 2023-11-18 NOTE — Progress Notes (Signed)
   11/18/23 1515  Spiritual Encounters  Type of Visit Initial  Care provided to: Patient  Conversation partners present during encounter Nurse  Referral source Code page  Reason for visit Code  OnCall Visit Yes  Interventions  Spiritual Care Interventions Made Established relationship of care and support;Compassionate presence  Intervention Outcomes  Outcomes Connection to spiritual care;Awareness around self/spiritual resourses  Spiritual Care Plan  Spiritual Care Issues Still Outstanding No further spiritual care needs at this time (see row info)   Chaplain stayed in room to comfort patient

## 2023-11-18 NOTE — ED Provider Notes (Signed)
 [facility] Provider Note    Event Date/Time   First MD Initiated Contact with Patient 11/18/23 1517     (approximate)   History   Code STEMI   HPI [name] is a 89 y.o. female with history of HFpEF, anxiety, HTN, GERD presenting today for abdominal pain.  Patient states not feeling well since last night.  She had abdominal pain/chest pain radiating to her back, uneasy feeling like nausea but no vomiting.  Also having diarrhea.  Admits to dysuria.  Otherwise denies fever, chills, cough, congestion, shortness of breath.  With EMS, they were concerned with her EKG in the field of a possible STEMI and called an alert.  STEMI alert was canceled on arrival.     Physical Exam   Triage Vital Signs: ED Triage Vitals  Encounter Vitals Group     BP 11/18/23 1516 (!) 143/72     Systolic BP Percentile --      Diastolic BP Percentile --      Pulse Rate 11/18/23 1516 84     Resp 11/18/23 1516 18     Temp 11/18/23 1516 98.7 F (37.1 C)     Temp Source 11/18/23 1516 Oral     SpO2 11/18/23 1516 99 %     Weight 11/18/23 1520 92 lb 6 oz (41.9 kg)     Height 11/18/23 1520 5\' 6"  (1.676 m)     Head Circumference --      Peak Flow --      Pain Score 11/18/23 1519 0     Pain Loc --      Pain Education --      Exclude from Growth Chart --     Most recent vital signs: Vitals:   11/18/23 1730 11/18/23 1930  BP: (!) 125/54 (!) 142/58  Pulse: (!) 54 (!) 29  Resp: 11 13  Temp:  98.6 F (37 C)  SpO2: 100% 98%   Physical Exam: I have reviewed the vital signs and nursing notes. General: Awake, alert, no acute distress.  Nontoxic appearing. Head:  Atraumatic, normocephalic.   ENT:  EOM intact, PERRL. Oral mucosa is pink and moist with no lesions. Neck: Neck is supple with full range of motion, No meningeal signs. Cardiovascular:  RRR, No murmurs. Peripheral pulses palpable and equal bilaterally. Respiratory:  Symmetrical chest wall expansion.  No rhonchi,  rales, or wheezes.  Good air movement throughout.  No use of accessory muscles.   Musculoskeletal:  No cyanosis or edema. Moving extremities with full ROM Abdomen:  Soft, generalized tenderness palpation throughout the abdomen but worse in the epigastric, right upper quadrant, right lower quadrant, nondistended. Neuro:  GCS 15, moving all four extremities, interacting appropriately. Speech clear. Psych:  Calm, appropriate.   Skin:  Warm, dry, no rash.    ED Results / Procedures / Treatments   Labs (all labs ordered are listed, but only abnormal results are displayed) Labs Reviewed  CBC WITH DIFFERENTIAL/PLATELET - Abnormal; Notable for the following components:      Result Value   RBC 3.67 (*)    Hemoglobin 11.9 (*)    MCV 101.6 (*)    All other components within normal limits  COMPREHENSIVE METABOLIC PANEL WITH GFR - Abnormal; Notable for the following components:   Glucose, Bld 133 (*)    BUN 25 (*)    Calcium 8.5 (*)    All other components within normal limits  URINALYSIS, ROUTINE W REFLEX MICROSCOPIC - Abnormal; Notable  for the following components:   Color, Urine YELLOW (*)    APPearance CLOUDY (*)    Protein, ur 30 (*)    Leukocytes,Ua SMALL (*)    Bacteria, UA MANY (*)    All other components within normal limits  RESP PANEL BY RT-PCR (RSV, FLU A&B, COVID)  RVPGX2  LIPASE, BLOOD  TROPONIN I (HIGH SENSITIVITY)  TROPONIN I (HIGH SENSITIVITY)     EKG My EKG interpretation: Rate of 77, normal sinus rhythm, normal axis, normal intervals.  Left bundle branch block.  No acute ST elevations or depressions   RADIOLOGY Independently interpreted chest x-ray with no acute findings.  Independently interpreted CT abdomen/pelvis with evidence of mild colitis.  Right upper quadrant ultrasound with no acute findings   PROCEDURES:  Critical Care performed: No  Procedures   MEDICATIONS ORDERED IN ED: Medications  morphine  (PF) 4 MG/ML injection 4 mg (4 mg Intravenous Given  11/18/23 1547)  ondansetron  (ZOFRAN ) injection 4 mg (4 mg Intravenous Given 11/18/23 1547)  cefTRIAXone  (ROCEPHIN ) 1 g in sodium chloride  0.9 % 100 mL IVPB (0 g Intravenous Stopped 11/18/23 1745)  iohexol  (OMNIPAQUE ) 300 MG/ML solution 75 mL (75 mLs Intravenous Contrast Given 11/18/23 1633)  alum & mag hydroxide-simeth (MAALOX/MYLANTA) 200-200-20 MG/5ML suspension 30 mL (30 mLs Oral Given 11/18/23 1831)  lidocaine  (XYLOCAINE ) 2 % viscous mouth solution 15 mL (15 mLs Mouth/Throat Given 11/18/23 1831)  lactated ringers  bolus 1,000 mL (1,000 mLs Intravenous New Bag/Given 11/18/23 1838)  morphine  (PF) 4 MG/ML injection 4 mg (4 mg Intravenous Given 11/18/23 1834)     IMPRESSION / MDM / ASSESSMENT AND PLAN / ED COURSE  I reviewed the triage vital signs and the nursing notes.                              Differential diagnosis includes, but is not limited to, pancreatitis, colitis, enteritis, viral GI infection, acute cystitis, pneumonia, viral URI, appendicitis, lower concern for ACS  Patient's presentation is most consistent with acute complicated illness / injury requiring diagnostic workup.  Patient is a 89 year old female presenting today for abdominal pain and chest pain over the past 24 hours.  With EMS was originally called a possible STEMI alert.  On arrival her EKG more consistent with a left bundle branch block and STEMI alert was canceled.  Pain symptoms seem to be more related to her abdomen with associated nausea and dysuria.  Will evaluate both from a cardiac perspective as well as additional abdominal workup.  Given morphine  and Zofran  for symptomatic management.  Troponin negative x 2 with no concern for ACS.  CT abdomen/pelvis shows evidence of mild colitis which would likely explain her abdominal pain and diarrhea.  Follow-up right upper quadrant ultrasound was ordered given possible findings within the common bile duct.  This was negative.  Chest x-ray otherwise unremarkable.  Patient was  given dose of ceftriaxone  here.  Negative for COVID, flu, RSV.  On reassessment she is feeling better and tolerating p.o.  Stable the entire time here with no altered mental status.  Do think she is safe for treatment of her UTI and colitis at her facility.  Spoke with son over the phone who is in agreement with this plan.  Will discharge back to facility with antibiotics.  The patient is on the cardiac monitor to evaluate for evidence of arrhythmia and/or significant heart rate changes. Clinical Course as of 11/18/23 2101  Mon Nov 18, 2023  1619 Urinalysis, Routine w reflex microscopic -Urine, Clean Catch(!) Leukocytes, bacteria, WBC and positive dysuria.  Will treat with ceftriaxone  until seeing results of CT [DW]  1813 CT ABDOMEN PELVIS W CONTRAST Patient's right upper quadrant is essentially the only place she does not have pain at.  Will get RUQ ultrasound to further evaluate but low concern to require MRCP. [DW]  2059 Patient is tolerating p.o.  No ongoing nausea or vomiting or pain.  Vital signs been stable the entire time.  No change in mental status.  Think she is safe for discharge back to her facility for colitis and UTI treatment. [DW]  2059 Spoke with patient's son who is understanding of this and agreeable with plan. [DW]    Clinical Course User Index [DW] Kandee Orion, MD     FINAL CLINICAL IMPRESSION(S) / ED DIAGNOSES   Final diagnoses:  Acute cystitis with hematuria  Colitis     Rx / DC Orders   ED Discharge Orders          Ordered    cefUROXime (CEFTIN) 500 MG tablet  2 times daily with meals        11/18/23 2100             Note:  This document was prepared using Dragon voice recognition software and may include unintentional dictation errors.   Kandee Orion, MD 11/18/23 2102

## 2023-11-19 ENCOUNTER — Telehealth: Payer: Self-pay

## 2023-11-19 DIAGNOSIS — N390 Urinary tract infection, site not specified: Secondary | ICD-10-CM | POA: Diagnosis not present

## 2023-11-19 DIAGNOSIS — R000 Tachycardia, unspecified: Secondary | ICD-10-CM | POA: Diagnosis not present

## 2023-11-19 DIAGNOSIS — R002 Palpitations: Secondary | ICD-10-CM | POA: Diagnosis not present

## 2023-11-19 DIAGNOSIS — R079 Chest pain, unspecified: Secondary | ICD-10-CM | POA: Diagnosis not present

## 2023-11-19 NOTE — Transitions of Care (Post Inpatient/ED Visit) (Signed)
 11/19/2023  Name: [name] MRN: [id number] DOB: 1927-09-02  Today's TOC FU Call Status: Today's TOC FU Call Status:: Successful TOC FU Call Completed TOC FU Call Complete Date: 11/18/23 Patient's Name and Date of Birth confirmed.  Transition Care Management Follow-up Telephone Call Date of Discharge: 11/18/23 Discharge Facility: [facility]) Type of Discharge: Emergency Department Reason for ED Visit: Other: (UTI) How have you been since you were released from the hospital?: Better Any questions or concerns?: No  Items Reviewed: Did you receive and understand the discharge instructions provided?: Yes Medications obtained,verified, and reconciled?: Yes (Medications Reviewed) Any new allergies since your discharge?: No Dietary orders reviewed?: NA Do you have support at home?: Yes People in Home [RPT]: child(ren), adult  Medications Reviewed Today: Medications Reviewed Today     Reviewed by Darrall Ellison, LPN (Licensed Practical Nurse) on 11/19/23 at 1559  Med List Status: <None>   Medication Order Taking? Sig Documenting Provider Last Dose Status Informant  acetaminophen  (TYLENOL ) 325 MG tablet [id number]  Take 2 tablets (650 mg total) by mouth every 6 (six) hours as needed for mild pain (pain score 1-3), moderate pain (pain score 4-6), fever or headache (or Fever >/= 101). Ree Candy, MD  Active   acyclovir  (ZOVIRAX ) 200 MG capsule [id number] No TAKE ONE CAPSULE BY MOUTH TWO TIMES DAILY Helaine Llanos, MD Taking Active Pharmacy Records, Other  amLODipine  (NORVASC ) 5 MG tablet [id number]  Take 1 tablet (5 mg total) by mouth daily. Helaine Llanos, MD  Active Pharmacy Records, Other  antiseptic oral rinse [facility]) LIQD [id number] No 15 mLs by Mouth Rinse route as needed for dry mouth. [provider] Taking Active Pharmacy Records, Other  azelastine  (ASTELIN ) 0.1 % nasal spray [id number] No Place 2 sprays into both nostrils 2  (two) times daily. [provider] Taking Active Pharmacy Records, Other  cefUROXime (CEFTIN) 500 MG tablet [id number]  Take 1 tablet (500 mg total) by mouth 2 (two) times daily with a meal for 5 days. Kandee Orion, MD  Active   feeding supplement (ENSURE ENLIVE / ENSURE PLUS) LIQD [id number] No Take 237 mLs by mouth 3 (three) times daily between meals. Luna Salinas, MD Taking Active Pharmacy Records, Other  fexofenadine [facility]) 180 MG tablet [id number] No Take 180 mg by mouth daily. [provider] Taking Active Pharmacy Records, Other  fluticasone  (FLONASE ) 50 MCG/ACT nasal spray [id number]  PLACE TWO SPRAYS INTO BOTH NOSTRILS DAILY AS NEEDED Helaine Llanos, MD  Active Pharmacy Records, Other  Multiple Vitamin (MULTIVITAMIN) tablet [id number] No Take 1 tablet by mouth once daily [provider] Taking Active Pharmacy Records, Other  omeprazole  (PRILOSEC) 20 MG capsule [id number] No TAKE ONE CAPSULE BY MOUTH DAILY Helaine Llanos, MD Taking Active Pharmacy Records, Other  polyethylene glycol (MIRALAX  / GLYCOLAX ) packet [id number] No Take 17 g by mouth at bedtime. [provider] Taking Active Pharmacy Records, Other  RESTASIS  0.05 % ophthalmic emulsion [id number] No Place 1 drop into both eyes 2 (two) times daily. [provider] Taking Active Pharmacy Records, Other  topiramate  (TOPAMAX ) 50 MG tablet [id number] No TAKE ONE TABLET BY MOUTH AT BEDTIME FOR COMPLICATION OF SHINGLES Letvak, Richard I, MD Taking Active Pharmacy Records, Other  Med List Note Diedre Fox, RN 12/15/15 1144): New UDS: 12/15/15            Home Care and Equipment/Supplies: Were Home Health Services Ordered?: NA Any new equipment or medical supplies ordered?: NA  Functional Questionnaire: Do you need assistance with bathing/showering or dressing?: No Do you need assistance with meal preparation?: No Do you need assistance with eating?: No Do you have difficulty  maintaining continence: No Do you need assistance with getting out of bed/getting out of a chair/moving?: No Do you have difficulty managing or taking your medications?: No  Follow up appointments reviewed: PCP Follow-up appointment confirmed?: No (declined) MD Provider Line Number:[phone] Given: No Specialist Hospital Follow-up appointment confirmed?: NA Do you need transportation to your follow-up appointment?: No Do you understand care options if your condition(s) worsen?: Yes-patient verbalized understanding    SIGNATURE Darrall Ellison, LPN [facility] Nurse Health Advisor Direct Dial [phone]

## 2023-11-20 DIAGNOSIS — R000 Tachycardia, unspecified: Secondary | ICD-10-CM | POA: Diagnosis not present

## 2023-11-20 DIAGNOSIS — R002 Palpitations: Secondary | ICD-10-CM | POA: Diagnosis not present

## 2023-11-20 DIAGNOSIS — R079 Chest pain, unspecified: Secondary | ICD-10-CM | POA: Diagnosis not present

## 2023-11-20 DIAGNOSIS — N390 Urinary tract infection, site not specified: Secondary | ICD-10-CM | POA: Diagnosis not present

## 2023-11-21 DIAGNOSIS — R002 Palpitations: Secondary | ICD-10-CM | POA: Diagnosis not present

## 2023-11-21 DIAGNOSIS — R079 Chest pain, unspecified: Secondary | ICD-10-CM | POA: Diagnosis not present

## 2023-11-21 DIAGNOSIS — R000 Tachycardia, unspecified: Secondary | ICD-10-CM | POA: Diagnosis not present

## 2023-11-21 DIAGNOSIS — N390 Urinary tract infection, site not specified: Secondary | ICD-10-CM | POA: Diagnosis not present

## 2023-11-22 DIAGNOSIS — N390 Urinary tract infection, site not specified: Secondary | ICD-10-CM | POA: Diagnosis not present

## 2023-11-22 DIAGNOSIS — R002 Palpitations: Secondary | ICD-10-CM | POA: Diagnosis not present

## 2023-11-22 DIAGNOSIS — R000 Tachycardia, unspecified: Secondary | ICD-10-CM | POA: Diagnosis not present

## 2023-11-22 DIAGNOSIS — R079 Chest pain, unspecified: Secondary | ICD-10-CM | POA: Diagnosis not present

## 2023-12-03 DIAGNOSIS — I5032 Chronic diastolic (congestive) heart failure: Secondary | ICD-10-CM | POA: Diagnosis not present

## 2023-12-03 DIAGNOSIS — I6930 Unspecified sequelae of cerebral infarction: Secondary | ICD-10-CM | POA: Diagnosis not present

## 2023-12-03 DIAGNOSIS — E640 Sequelae of protein-calorie malnutrition: Secondary | ICD-10-CM | POA: Diagnosis not present

## 2023-12-03 DIAGNOSIS — R54 Age-related physical debility: Secondary | ICD-10-CM | POA: Diagnosis not present

## 2023-12-03 DIAGNOSIS — Z515 Encounter for palliative care: Secondary | ICD-10-CM | POA: Diagnosis not present

## 2023-12-03 DIAGNOSIS — R630 Anorexia: Secondary | ICD-10-CM | POA: Diagnosis not present

## 2023-12-06 DIAGNOSIS — S52551D Other extraarticular fracture of lower end of right radius, subsequent encounter for closed fracture with routine healing: Secondary | ICD-10-CM | POA: Diagnosis not present

## 2023-12-06 DIAGNOSIS — S42291G Other displaced fracture of upper end of right humerus, subsequent encounter for fracture with delayed healing: Secondary | ICD-10-CM | POA: Diagnosis not present

## 2023-12-11 DIAGNOSIS — S42201D Unspecified fracture of upper end of right humerus, subsequent encounter for fracture with routine healing: Secondary | ICD-10-CM | POA: Diagnosis not present

## 2023-12-16 DIAGNOSIS — S42201D Unspecified fracture of upper end of right humerus, subsequent encounter for fracture with routine healing: Secondary | ICD-10-CM | POA: Diagnosis not present

## 2023-12-16 DIAGNOSIS — R296 Repeated falls: Secondary | ICD-10-CM | POA: Diagnosis not present

## 2023-12-16 DIAGNOSIS — J449 Chronic obstructive pulmonary disease, unspecified: Secondary | ICD-10-CM | POA: Diagnosis not present

## 2023-12-16 DIAGNOSIS — F5104 Psychophysiologic insomnia: Secondary | ICD-10-CM | POA: Diagnosis not present

## 2023-12-16 DIAGNOSIS — I10 Essential (primary) hypertension: Secondary | ICD-10-CM | POA: Diagnosis not present

## 2023-12-16 DIAGNOSIS — R52 Pain, unspecified: Secondary | ICD-10-CM | POA: Diagnosis not present

## 2023-12-16 DIAGNOSIS — M1990 Unspecified osteoarthritis, unspecified site: Secondary | ICD-10-CM | POA: Diagnosis not present

## 2023-12-18 DIAGNOSIS — L600 Ingrowing nail: Secondary | ICD-10-CM | POA: Diagnosis not present

## 2023-12-18 DIAGNOSIS — S42201D Unspecified fracture of upper end of right humerus, subsequent encounter for fracture with routine healing: Secondary | ICD-10-CM | POA: Diagnosis not present

## 2023-12-23 DIAGNOSIS — S42201D Unspecified fracture of upper end of right humerus, subsequent encounter for fracture with routine healing: Secondary | ICD-10-CM | POA: Diagnosis not present

## 2023-12-24 DIAGNOSIS — I10 Essential (primary) hypertension: Secondary | ICD-10-CM | POA: Diagnosis not present

## 2023-12-24 DIAGNOSIS — R296 Repeated falls: Secondary | ICD-10-CM | POA: Diagnosis not present

## 2023-12-24 DIAGNOSIS — J449 Chronic obstructive pulmonary disease, unspecified: Secondary | ICD-10-CM | POA: Diagnosis not present

## 2023-12-24 DIAGNOSIS — R52 Pain, unspecified: Secondary | ICD-10-CM | POA: Diagnosis not present

## 2023-12-25 DIAGNOSIS — S42201D Unspecified fracture of upper end of right humerus, subsequent encounter for fracture with routine healing: Secondary | ICD-10-CM | POA: Diagnosis not present

## 2023-12-26 DIAGNOSIS — R630 Anorexia: Secondary | ICD-10-CM | POA: Diagnosis not present

## 2023-12-26 DIAGNOSIS — M6281 Muscle weakness (generalized): Secondary | ICD-10-CM | POA: Diagnosis not present

## 2023-12-26 DIAGNOSIS — I6930 Unspecified sequelae of cerebral infarction: Secondary | ICD-10-CM | POA: Diagnosis not present

## 2023-12-26 DIAGNOSIS — R54 Age-related physical debility: Secondary | ICD-10-CM | POA: Diagnosis not present

## 2023-12-26 DIAGNOSIS — R1013 Epigastric pain: Secondary | ICD-10-CM | POA: Diagnosis not present

## 2023-12-26 DIAGNOSIS — R12 Heartburn: Secondary | ICD-10-CM | POA: Diagnosis not present

## 2023-12-26 DIAGNOSIS — I5032 Chronic diastolic (congestive) heart failure: Secondary | ICD-10-CM | POA: Diagnosis not present

## 2023-12-26 DIAGNOSIS — Z515 Encounter for palliative care: Secondary | ICD-10-CM | POA: Diagnosis not present

## 2023-12-26 DIAGNOSIS — J449 Chronic obstructive pulmonary disease, unspecified: Secondary | ICD-10-CM | POA: Diagnosis not present

## 2023-12-26 DIAGNOSIS — I10 Essential (primary) hypertension: Secondary | ICD-10-CM | POA: Diagnosis not present

## 2023-12-26 DIAGNOSIS — R112 Nausea with vomiting, unspecified: Secondary | ICD-10-CM | POA: Diagnosis not present

## 2023-12-26 DIAGNOSIS — E640 Sequelae of protein-calorie malnutrition: Secondary | ICD-10-CM | POA: Diagnosis not present

## 2023-12-26 DIAGNOSIS — Z79899 Other long term (current) drug therapy: Secondary | ICD-10-CM | POA: Diagnosis not present

## 2023-12-27 DIAGNOSIS — N390 Urinary tract infection, site not specified: Secondary | ICD-10-CM | POA: Diagnosis not present

## 2023-12-27 DIAGNOSIS — R12 Heartburn: Secondary | ICD-10-CM | POA: Diagnosis not present

## 2023-12-27 DIAGNOSIS — R112 Nausea with vomiting, unspecified: Secondary | ICD-10-CM | POA: Diagnosis not present

## 2023-12-27 DIAGNOSIS — Z79899 Other long term (current) drug therapy: Secondary | ICD-10-CM | POA: Diagnosis not present

## 2023-12-27 DIAGNOSIS — R1013 Epigastric pain: Secondary | ICD-10-CM | POA: Diagnosis not present

## 2023-12-30 DIAGNOSIS — R12 Heartburn: Secondary | ICD-10-CM | POA: Diagnosis not present

## 2023-12-30 DIAGNOSIS — R112 Nausea with vomiting, unspecified: Secondary | ICD-10-CM | POA: Diagnosis not present

## 2023-12-30 DIAGNOSIS — R1013 Epigastric pain: Secondary | ICD-10-CM | POA: Diagnosis not present

## 2023-12-30 DIAGNOSIS — Z79899 Other long term (current) drug therapy: Secondary | ICD-10-CM | POA: Diagnosis not present

## 2023-12-31 DIAGNOSIS — R12 Heartburn: Secondary | ICD-10-CM | POA: Diagnosis not present

## 2023-12-31 DIAGNOSIS — R112 Nausea with vomiting, unspecified: Secondary | ICD-10-CM | POA: Diagnosis not present

## 2023-12-31 DIAGNOSIS — R1013 Epigastric pain: Secondary | ICD-10-CM | POA: Diagnosis not present

## 2023-12-31 DIAGNOSIS — Z79899 Other long term (current) drug therapy: Secondary | ICD-10-CM | POA: Diagnosis not present

## 2024-01-06 DIAGNOSIS — S42201D Unspecified fracture of upper end of right humerus, subsequent encounter for fracture with routine healing: Secondary | ICD-10-CM | POA: Diagnosis not present

## 2024-01-07 DIAGNOSIS — M79675 Pain in left toe(s): Secondary | ICD-10-CM | POA: Diagnosis not present

## 2024-01-07 DIAGNOSIS — M79674 Pain in right toe(s): Secondary | ICD-10-CM | POA: Diagnosis not present

## 2024-01-07 DIAGNOSIS — M79671 Pain in right foot: Secondary | ICD-10-CM | POA: Diagnosis not present

## 2024-01-07 DIAGNOSIS — L600 Ingrowing nail: Secondary | ICD-10-CM | POA: Diagnosis not present

## 2024-01-07 DIAGNOSIS — H6123 Impacted cerumen, bilateral: Secondary | ICD-10-CM | POA: Diagnosis not present

## 2024-01-07 DIAGNOSIS — H90A32 Mixed conductive and sensorineural hearing loss, unilateral, left ear with restricted hearing on the contralateral side: Secondary | ICD-10-CM | POA: Diagnosis not present

## 2024-01-07 DIAGNOSIS — B351 Tinea unguium: Secondary | ICD-10-CM | POA: Diagnosis not present

## 2024-01-07 DIAGNOSIS — M79672 Pain in left foot: Secondary | ICD-10-CM | POA: Diagnosis not present

## 2024-01-08 DIAGNOSIS — S42201D Unspecified fracture of upper end of right humerus, subsequent encounter for fracture with routine healing: Secondary | ICD-10-CM | POA: Diagnosis not present

## 2024-01-13 DIAGNOSIS — R300 Dysuria: Secondary | ICD-10-CM | POA: Diagnosis not present

## 2024-01-13 DIAGNOSIS — Z87440 Personal history of urinary (tract) infections: Secondary | ICD-10-CM | POA: Diagnosis not present

## 2024-01-13 DIAGNOSIS — Z515 Encounter for palliative care: Secondary | ICD-10-CM | POA: Diagnosis not present

## 2024-01-13 DIAGNOSIS — F5104 Psychophysiologic insomnia: Secondary | ICD-10-CM | POA: Diagnosis not present

## 2024-01-13 DIAGNOSIS — M6281 Muscle weakness (generalized): Secondary | ICD-10-CM | POA: Diagnosis not present

## 2024-01-13 DIAGNOSIS — R630 Anorexia: Secondary | ICD-10-CM | POA: Diagnosis not present

## 2024-01-13 DIAGNOSIS — I6930 Unspecified sequelae of cerebral infarction: Secondary | ICD-10-CM | POA: Diagnosis not present

## 2024-01-13 DIAGNOSIS — R54 Age-related physical debility: Secondary | ICD-10-CM | POA: Diagnosis not present

## 2024-01-13 DIAGNOSIS — S42201D Unspecified fracture of upper end of right humerus, subsequent encounter for fracture with routine healing: Secondary | ICD-10-CM | POA: Diagnosis not present

## 2024-01-13 DIAGNOSIS — I5032 Chronic diastolic (congestive) heart failure: Secondary | ICD-10-CM | POA: Diagnosis not present

## 2024-01-13 DIAGNOSIS — E640 Sequelae of protein-calorie malnutrition: Secondary | ICD-10-CM | POA: Diagnosis not present

## 2024-01-14 DIAGNOSIS — J449 Chronic obstructive pulmonary disease, unspecified: Secondary | ICD-10-CM | POA: Diagnosis not present

## 2024-01-14 DIAGNOSIS — N390 Urinary tract infection, site not specified: Secondary | ICD-10-CM | POA: Diagnosis not present

## 2024-01-15 DIAGNOSIS — R296 Repeated falls: Secondary | ICD-10-CM | POA: Diagnosis not present

## 2024-01-15 DIAGNOSIS — M6281 Muscle weakness (generalized): Secondary | ICD-10-CM | POA: Diagnosis not present

## 2024-01-15 DIAGNOSIS — R531 Weakness: Secondary | ICD-10-CM | POA: Diagnosis not present

## 2024-01-15 DIAGNOSIS — N390 Urinary tract infection, site not specified: Secondary | ICD-10-CM | POA: Diagnosis not present

## 2024-01-16 DIAGNOSIS — I10 Essential (primary) hypertension: Secondary | ICD-10-CM | POA: Diagnosis not present

## 2024-01-16 DIAGNOSIS — R627 Adult failure to thrive: Secondary | ICD-10-CM | POA: Diagnosis not present

## 2024-01-16 DIAGNOSIS — K089 Disorder of teeth and supporting structures, unspecified: Secondary | ICD-10-CM | POA: Diagnosis not present

## 2024-01-16 DIAGNOSIS — Z8673 Personal history of transient ischemic attack (TIA), and cerebral infarction without residual deficits: Secondary | ICD-10-CM | POA: Diagnosis not present

## 2024-01-16 DIAGNOSIS — J449 Chronic obstructive pulmonary disease, unspecified: Secondary | ICD-10-CM | POA: Diagnosis not present

## 2024-01-22 DIAGNOSIS — I10 Essential (primary) hypertension: Secondary | ICD-10-CM | POA: Diagnosis not present

## 2024-01-22 DIAGNOSIS — R531 Weakness: Secondary | ICD-10-CM | POA: Diagnosis not present

## 2024-01-22 DIAGNOSIS — Z853 Personal history of malignant neoplasm of breast: Secondary | ICD-10-CM | POA: Diagnosis not present

## 2024-01-22 DIAGNOSIS — J449 Chronic obstructive pulmonary disease, unspecified: Secondary | ICD-10-CM | POA: Diagnosis not present

## 2024-01-27 DIAGNOSIS — M6281 Muscle weakness (generalized): Secondary | ICD-10-CM | POA: Diagnosis not present

## 2024-01-27 DIAGNOSIS — R52 Pain, unspecified: Secondary | ICD-10-CM | POA: Diagnosis not present

## 2024-01-27 DIAGNOSIS — F5104 Psychophysiologic insomnia: Secondary | ICD-10-CM | POA: Diagnosis not present

## 2024-01-27 DIAGNOSIS — I10 Essential (primary) hypertension: Secondary | ICD-10-CM | POA: Diagnosis not present

## 2024-01-31 DIAGNOSIS — I6930 Unspecified sequelae of cerebral infarction: Secondary | ICD-10-CM | POA: Diagnosis not present

## 2024-01-31 DIAGNOSIS — Z515 Encounter for palliative care: Secondary | ICD-10-CM | POA: Diagnosis not present

## 2024-01-31 DIAGNOSIS — E640 Sequelae of protein-calorie malnutrition: Secondary | ICD-10-CM | POA: Diagnosis not present

## 2024-01-31 DIAGNOSIS — R54 Age-related physical debility: Secondary | ICD-10-CM | POA: Diagnosis not present

## 2024-01-31 DIAGNOSIS — R630 Anorexia: Secondary | ICD-10-CM | POA: Diagnosis not present

## 2024-01-31 DIAGNOSIS — I5032 Chronic diastolic (congestive) heart failure: Secondary | ICD-10-CM | POA: Diagnosis not present

## 2024-02-04 DIAGNOSIS — R109 Unspecified abdominal pain: Secondary | ICD-10-CM | POA: Diagnosis not present

## 2024-02-05 DIAGNOSIS — R197 Diarrhea, unspecified: Secondary | ICD-10-CM | POA: Diagnosis not present

## 2024-02-05 DIAGNOSIS — R109 Unspecified abdominal pain: Secondary | ICD-10-CM | POA: Diagnosis not present

## 2024-02-05 DIAGNOSIS — K5900 Constipation, unspecified: Secondary | ICD-10-CM | POA: Diagnosis not present

## 2024-02-10 DIAGNOSIS — N200 Calculus of kidney: Secondary | ICD-10-CM | POA: Diagnosis not present

## 2024-02-10 DIAGNOSIS — R109 Unspecified abdominal pain: Secondary | ICD-10-CM | POA: Diagnosis not present

## 2024-02-10 DIAGNOSIS — R300 Dysuria: Secondary | ICD-10-CM | POA: Diagnosis not present

## 2024-02-10 DIAGNOSIS — K5900 Constipation, unspecified: Secondary | ICD-10-CM | POA: Diagnosis not present

## 2024-02-10 DIAGNOSIS — M545 Low back pain, unspecified: Secondary | ICD-10-CM | POA: Diagnosis not present

## 2024-02-10 DIAGNOSIS — R197 Diarrhea, unspecified: Secondary | ICD-10-CM | POA: Diagnosis not present

## 2024-02-10 DIAGNOSIS — G8929 Other chronic pain: Secondary | ICD-10-CM | POA: Diagnosis not present

## 2024-02-10 DIAGNOSIS — F5104 Psychophysiologic insomnia: Secondary | ICD-10-CM | POA: Diagnosis not present

## 2024-02-11 DIAGNOSIS — N390 Urinary tract infection, site not specified: Secondary | ICD-10-CM | POA: Diagnosis not present

## 2024-02-11 DIAGNOSIS — K5900 Constipation, unspecified: Secondary | ICD-10-CM | POA: Diagnosis not present

## 2024-02-12 DIAGNOSIS — N390 Urinary tract infection, site not specified: Secondary | ICD-10-CM | POA: Diagnosis not present

## 2024-02-12 DIAGNOSIS — G8929 Other chronic pain: Secondary | ICD-10-CM | POA: Diagnosis not present

## 2024-02-14 DIAGNOSIS — R197 Diarrhea, unspecified: Secondary | ICD-10-CM | POA: Diagnosis not present

## 2024-02-14 DIAGNOSIS — N390 Urinary tract infection, site not specified: Secondary | ICD-10-CM | POA: Diagnosis not present

## 2024-02-14 DIAGNOSIS — M6281 Muscle weakness (generalized): Secondary | ICD-10-CM | POA: Diagnosis not present

## 2024-02-17 DIAGNOSIS — N390 Urinary tract infection, site not specified: Secondary | ICD-10-CM | POA: Diagnosis not present

## 2024-02-17 DIAGNOSIS — G8929 Other chronic pain: Secondary | ICD-10-CM | POA: Diagnosis not present

## 2024-02-17 DIAGNOSIS — G4700 Insomnia, unspecified: Secondary | ICD-10-CM | POA: Diagnosis not present

## 2024-02-17 DIAGNOSIS — R110 Nausea: Secondary | ICD-10-CM | POA: Diagnosis not present

## 2024-02-19 DIAGNOSIS — Z853 Personal history of malignant neoplasm of breast: Secondary | ICD-10-CM | POA: Diagnosis not present

## 2024-02-19 DIAGNOSIS — D649 Anemia, unspecified: Secondary | ICD-10-CM | POA: Diagnosis not present

## 2024-02-19 DIAGNOSIS — K219 Gastro-esophageal reflux disease without esophagitis: Secondary | ICD-10-CM | POA: Diagnosis not present

## 2024-02-19 DIAGNOSIS — J449 Chronic obstructive pulmonary disease, unspecified: Secondary | ICD-10-CM | POA: Diagnosis not present

## 2024-02-19 DIAGNOSIS — Z79899 Other long term (current) drug therapy: Secondary | ICD-10-CM | POA: Diagnosis not present

## 2024-02-19 DIAGNOSIS — M6281 Muscle weakness (generalized): Secondary | ICD-10-CM | POA: Diagnosis not present

## 2024-02-19 DIAGNOSIS — I10 Essential (primary) hypertension: Secondary | ICD-10-CM | POA: Diagnosis not present

## 2024-02-24 DIAGNOSIS — G629 Polyneuropathy, unspecified: Secondary | ICD-10-CM | POA: Diagnosis not present

## 2024-02-24 DIAGNOSIS — R300 Dysuria: Secondary | ICD-10-CM | POA: Diagnosis not present

## 2024-02-24 DIAGNOSIS — G4700 Insomnia, unspecified: Secondary | ICD-10-CM | POA: Diagnosis not present

## 2024-02-24 DIAGNOSIS — R350 Frequency of micturition: Secondary | ICD-10-CM | POA: Diagnosis not present

## 2024-02-25 DIAGNOSIS — R350 Frequency of micturition: Secondary | ICD-10-CM | POA: Diagnosis not present

## 2024-02-25 DIAGNOSIS — R8290 Unspecified abnormal findings in urine: Secondary | ICD-10-CM | POA: Diagnosis not present

## 2024-02-26 DIAGNOSIS — R300 Dysuria: Secondary | ICD-10-CM | POA: Diagnosis not present

## 2024-02-26 DIAGNOSIS — N390 Urinary tract infection, site not specified: Secondary | ICD-10-CM | POA: Diagnosis not present

## 2024-02-26 DIAGNOSIS — R350 Frequency of micturition: Secondary | ICD-10-CM | POA: Diagnosis not present

## 2024-02-27 DIAGNOSIS — E640 Sequelae of protein-calorie malnutrition: Secondary | ICD-10-CM | POA: Diagnosis not present

## 2024-02-27 DIAGNOSIS — R54 Age-related physical debility: Secondary | ICD-10-CM | POA: Diagnosis not present

## 2024-02-27 DIAGNOSIS — R350 Frequency of micturition: Secondary | ICD-10-CM | POA: Diagnosis not present

## 2024-02-27 DIAGNOSIS — N390 Urinary tract infection, site not specified: Secondary | ICD-10-CM | POA: Diagnosis not present

## 2024-02-27 DIAGNOSIS — R300 Dysuria: Secondary | ICD-10-CM | POA: Diagnosis not present

## 2024-02-27 DIAGNOSIS — I6930 Unspecified sequelae of cerebral infarction: Secondary | ICD-10-CM | POA: Diagnosis not present

## 2024-02-27 DIAGNOSIS — I5032 Chronic diastolic (congestive) heart failure: Secondary | ICD-10-CM | POA: Diagnosis not present

## 2024-02-27 DIAGNOSIS — R630 Anorexia: Secondary | ICD-10-CM | POA: Diagnosis not present

## 2024-02-27 DIAGNOSIS — Z515 Encounter for palliative care: Secondary | ICD-10-CM | POA: Diagnosis not present

## 2024-03-02 DIAGNOSIS — J3489 Other specified disorders of nose and nasal sinuses: Secondary | ICD-10-CM | POA: Diagnosis not present

## 2024-03-02 DIAGNOSIS — J302 Other seasonal allergic rhinitis: Secondary | ICD-10-CM | POA: Diagnosis not present

## 2024-03-02 DIAGNOSIS — R110 Nausea: Secondary | ICD-10-CM | POA: Diagnosis not present

## 2024-03-02 DIAGNOSIS — N390 Urinary tract infection, site not specified: Secondary | ICD-10-CM | POA: Diagnosis not present

## 2024-03-02 DIAGNOSIS — K521 Toxic gastroenteritis and colitis: Secondary | ICD-10-CM | POA: Diagnosis not present

## 2024-03-06 DIAGNOSIS — N390 Urinary tract infection, site not specified: Secondary | ICD-10-CM | POA: Diagnosis not present

## 2024-03-06 DIAGNOSIS — K521 Toxic gastroenteritis and colitis: Secondary | ICD-10-CM | POA: Diagnosis not present

## 2024-03-08 ENCOUNTER — Other Ambulatory Visit: Payer: Self-pay

## 2024-03-08 ENCOUNTER — Inpatient Hospital Stay
Admission: EM | Admit: 2024-03-08 | Discharge: 2024-03-10 | DRG: 071 | Disposition: A | Discharge status: Skilled Nursing Facility | Source: Skilled Nursing Facility | Attending: Internal Medicine | Admitting: Internal Medicine

## 2024-03-08 ENCOUNTER — Emergency Department

## 2024-03-08 DIAGNOSIS — R0602 Shortness of breath: Secondary | ICD-10-CM | POA: Diagnosis not present

## 2024-03-08 DIAGNOSIS — I959 Hypotension, unspecified: Secondary | ICD-10-CM | POA: Diagnosis not present

## 2024-03-08 DIAGNOSIS — E785 Hyperlipidemia, unspecified: Secondary | ICD-10-CM | POA: Diagnosis present

## 2024-03-08 DIAGNOSIS — Z820 Family history of epilepsy and other diseases of the nervous system: Secondary | ICD-10-CM | POA: Diagnosis not present

## 2024-03-08 DIAGNOSIS — N200 Calculus of kidney: Secondary | ICD-10-CM | POA: Diagnosis present

## 2024-03-08 DIAGNOSIS — Z8249 Family history of ischemic heart disease and other diseases of the circulatory system: Secondary | ICD-10-CM

## 2024-03-08 DIAGNOSIS — K8020 Calculus of gallbladder without cholecystitis without obstruction: Secondary | ICD-10-CM | POA: Diagnosis not present

## 2024-03-08 DIAGNOSIS — N390 Urinary tract infection, site not specified: Secondary | ICD-10-CM

## 2024-03-08 DIAGNOSIS — N3000 Acute cystitis without hematuria: Secondary | ICD-10-CM | POA: Diagnosis not present

## 2024-03-08 DIAGNOSIS — Z7189 Other specified counseling: Secondary | ICD-10-CM | POA: Diagnosis not present

## 2024-03-08 DIAGNOSIS — R000 Tachycardia, unspecified: Secondary | ICD-10-CM | POA: Diagnosis not present

## 2024-03-08 DIAGNOSIS — Z9013 Acquired absence of bilateral breasts and nipples: Secondary | ICD-10-CM | POA: Diagnosis not present

## 2024-03-08 DIAGNOSIS — Z79899 Other long term (current) drug therapy: Secondary | ICD-10-CM | POA: Diagnosis not present

## 2024-03-08 DIAGNOSIS — Z888 Allergy status to other drugs, medicaments and biological substances status: Secondary | ICD-10-CM

## 2024-03-08 DIAGNOSIS — I739 Peripheral vascular disease, unspecified: Secondary | ICD-10-CM | POA: Diagnosis present

## 2024-03-08 DIAGNOSIS — Z87891 Personal history of nicotine dependence: Secondary | ICD-10-CM

## 2024-03-08 DIAGNOSIS — G9341 Metabolic encephalopathy: Principal | ICD-10-CM | POA: Diagnosis present

## 2024-03-08 DIAGNOSIS — Z66 Do not resuscitate: Secondary | ICD-10-CM | POA: Diagnosis present

## 2024-03-08 DIAGNOSIS — G4489 Other headache syndrome: Secondary | ICD-10-CM | POA: Diagnosis not present

## 2024-03-08 DIAGNOSIS — J90 Pleural effusion, not elsewhere classified: Secondary | ICD-10-CM | POA: Diagnosis not present

## 2024-03-08 DIAGNOSIS — Z9882 Breast implant status: Secondary | ICD-10-CM

## 2024-03-08 DIAGNOSIS — I10 Essential (primary) hypertension: Secondary | ICD-10-CM | POA: Diagnosis not present

## 2024-03-08 DIAGNOSIS — Z833 Family history of diabetes mellitus: Secondary | ICD-10-CM | POA: Diagnosis not present

## 2024-03-08 DIAGNOSIS — R932 Abnormal findings on diagnostic imaging of liver and biliary tract: Secondary | ICD-10-CM | POA: Diagnosis not present

## 2024-03-08 DIAGNOSIS — R4182 Altered mental status, unspecified: Secondary | ICD-10-CM | POA: Diagnosis not present

## 2024-03-08 DIAGNOSIS — Z8673 Personal history of transient ischemic attack (TIA), and cerebral infarction without residual deficits: Secondary | ICD-10-CM

## 2024-03-08 DIAGNOSIS — K529 Noninfective gastroenteritis and colitis, unspecified: Secondary | ICD-10-CM

## 2024-03-08 DIAGNOSIS — K219 Gastro-esophageal reflux disease without esophagitis: Secondary | ICD-10-CM | POA: Diagnosis present

## 2024-03-08 DIAGNOSIS — I5032 Chronic diastolic (congestive) heart failure: Secondary | ICD-10-CM | POA: Diagnosis present

## 2024-03-08 DIAGNOSIS — G629 Polyneuropathy, unspecified: Secondary | ICD-10-CM | POA: Diagnosis not present

## 2024-03-08 DIAGNOSIS — Z85828 Personal history of other malignant neoplasm of skin: Secondary | ICD-10-CM

## 2024-03-08 DIAGNOSIS — Z515 Encounter for palliative care: Secondary | ICD-10-CM | POA: Diagnosis not present

## 2024-03-08 DIAGNOSIS — Z853 Personal history of malignant neoplasm of breast: Secondary | ICD-10-CM

## 2024-03-08 DIAGNOSIS — R079 Chest pain, unspecified: Secondary | ICD-10-CM | POA: Diagnosis not present

## 2024-03-08 DIAGNOSIS — G4700 Insomnia, unspecified: Secondary | ICD-10-CM | POA: Diagnosis not present

## 2024-03-08 DIAGNOSIS — Z7401 Bed confinement status: Secondary | ICD-10-CM | POA: Diagnosis not present

## 2024-03-08 DIAGNOSIS — R109 Unspecified abdominal pain: Secondary | ICD-10-CM | POA: Diagnosis not present

## 2024-03-08 DIAGNOSIS — I110 Hypertensive heart disease with heart failure: Secondary | ICD-10-CM | POA: Diagnosis present

## 2024-03-08 DIAGNOSIS — I6782 Cerebral ischemia: Secondary | ICD-10-CM | POA: Diagnosis not present

## 2024-03-08 LAB — CBC WITH DIFFERENTIAL/PLATELET
Abs Immature Granulocytes: 0.05 K/uL (ref 0.00–0.07)
Basophils Absolute: 0 K/uL (ref 0.0–0.1)
Basophils Relative: 1 %
Eosinophils Absolute: 0.1 K/uL (ref 0.0–0.5)
Eosinophils Relative: 2 %
HCT: 40.4 % (ref 36.0–46.0)
Hemoglobin: 13 g/dL (ref 12.0–15.0)
Immature Granulocytes: 1 %
Lymphocytes Relative: 34 %
Lymphs Abs: 2.5 K/uL (ref 0.7–4.0)
MCH: 30.8 pg (ref 26.0–34.0)
MCHC: 32.2 g/dL (ref 30.0–36.0)
MCV: 95.7 fL (ref 80.0–100.0)
Monocytes Absolute: 0.5 K/uL (ref 0.1–1.0)
Monocytes Relative: 7 %
Neutro Abs: 4.2 K/uL (ref 1.7–7.7)
Neutrophils Relative %: 55 %
Platelets: 248 K/uL (ref 150–400)
RBC: 4.22 MIL/uL (ref 3.87–5.11)
RDW: 15 % (ref 11.5–15.5)
WBC: 7.5 K/uL (ref 4.0–10.5)
nRBC: 0 % (ref 0.0–0.2)

## 2024-03-08 LAB — CBC
HCT: 37.2 % (ref 36.0–46.0)
Hemoglobin: 12.1 g/dL (ref 12.0–15.0)
MCH: 30.8 pg (ref 26.0–34.0)
MCHC: 32.5 g/dL (ref 30.0–36.0)
MCV: 94.7 fL (ref 80.0–100.0)
Platelets: 215 K/uL (ref 150–400)
RBC: 3.93 MIL/uL (ref 3.87–5.11)
RDW: 14.9 % (ref 11.5–15.5)
WBC: 6.2 K/uL (ref 4.0–10.5)
nRBC: 0 % (ref 0.0–0.2)

## 2024-03-08 LAB — COMPREHENSIVE METABOLIC PANEL WITH GFR
ALT: 10 U/L (ref 0–44)
AST: 15 U/L (ref 15–41)
Albumin: 3.6 g/dL (ref 3.5–5.0)
Alkaline Phosphatase: 59 U/L (ref 38–126)
Anion gap: 11 (ref 5–15)
BUN: 15 mg/dL (ref 8–23)
CO2: 22 mmol/L (ref 22–32)
Calcium: 8.9 mg/dL (ref 8.9–10.3)
Chloride: 109 mmol/L (ref 98–111)
Creatinine, Ser: 0.92 mg/dL (ref 0.44–1.00)
GFR, Estimated: 58 mL/min — ABNORMAL LOW (ref >60)
Glucose, Bld: 101 mg/dL — ABNORMAL HIGH (ref 70–99)
Potassium: 3.5 mmol/L (ref 3.5–5.1)
Sodium: 142 mmol/L (ref 135–145)
Total Bilirubin: 0.3 mg/dL (ref 0.0–1.2)
Total Protein: 7.3 g/dL (ref 6.5–8.1)

## 2024-03-08 LAB — TROPONIN I (HIGH SENSITIVITY)
Troponin I (High Sensitivity): 13 ng/L (ref <18)
Troponin I (High Sensitivity): 11 ng/L (ref <18)

## 2024-03-08 LAB — URINALYSIS, ROUTINE W REFLEX MICROSCOPIC
Bilirubin Urine: NEGATIVE
Glucose, UA: NEGATIVE mg/dL
Ketones, ur: NEGATIVE mg/dL
Nitrite: NEGATIVE
Protein, ur: 30 mg/dL — AB
RBC / HPF: >50 RBC/hpf (ref 0–5)
Specific Gravity, Urine: 1.015 (ref 1.005–1.030)
pH: 6 (ref 5.0–8.0)

## 2024-03-08 LAB — CREATININE, SERUM
Creatinine, Ser: 0.73 mg/dL (ref 0.44–1.00)
GFR, Estimated: >60 mL/min (ref >60)

## 2024-03-08 LAB — LACTIC ACID, PLASMA: Lactic Acid, Venous: 1.3 mmol/L (ref 0.5–1.9)

## 2024-03-08 LAB — MAGNESIUM: Magnesium: 1.9 mg/dL (ref 1.7–2.4)

## 2024-03-08 MED ORDER — SODIUM CHLORIDE 0.9 % IV SOLN
100.0000 mg | Freq: Two times a day (BID) | INTRAVENOUS | Status: DC
  Administered 2024-03-09: 100 mg via INTRAVENOUS
  Filled 2024-03-08 (×2): qty 100

## 2024-03-08 MED ORDER — MAGNESIUM SULFATE 2 GM/50ML IV SOLN
2.0000 g | Freq: Once | INTRAVENOUS | Status: AC
  Administered 2024-03-08: 2 g via INTRAVENOUS
  Filled 2024-03-08: qty 50

## 2024-03-08 MED ORDER — AZELASTINE HCL 0.1 % NA SOLN
2.0000 | Freq: Two times a day (BID) | NASAL | Status: DC
  Administered 2024-03-09: 2 via NASAL
  Filled 2024-03-08: qty 30

## 2024-03-08 MED ORDER — SODIUM CHLORIDE 0.9 % IV SOLN
2.0000 g | INTRAVENOUS | Status: DC
  Filled 2024-03-08: qty 20

## 2024-03-08 MED ORDER — BIOTENE DRY MOUTH MT LIQD: 15.0000 mL | OROMUCOSAL | Status: DC | PRN

## 2024-03-08 MED ORDER — LACTATED RINGERS IV BOLUS
1000.0000 mL | Freq: Once | INTRAVENOUS | Status: AC
  Administered 2024-03-08: 1000 mL via INTRAVENOUS

## 2024-03-08 MED ORDER — CYCLOSPORINE 0.05 % OP EMUL
1.0000 [drp] | Freq: Two times a day (BID) | OPHTHALMIC | Status: DC
  Administered 2024-03-09: 1 [drp] via OPHTHALMIC
  Filled 2024-03-08 (×3): qty 30

## 2024-03-08 MED ORDER — TRAZODONE HCL 50 MG PO TABS
25.0000 mg | ORAL_TABLET | Freq: Every evening | ORAL | Status: DC | PRN
  Administered 2024-03-09: 25 mg via ORAL
  Filled 2024-03-08: qty 1

## 2024-03-08 MED ORDER — ENOXAPARIN SODIUM 30 MG/0.3ML IJ SOSY
30.0000 mg | PREFILLED_SYRINGE | Freq: Every day | INTRAMUSCULAR | Status: DC
  Administered 2024-03-08: 30 mg via SUBCUTANEOUS
  Filled 2024-03-08: qty 0.3

## 2024-03-08 MED ORDER — LIDOCAINE VISCOUS HCL 2 % MT SOLN
15.0000 mL | Freq: Once | OROMUCOSAL | Status: AC
  Administered 2024-03-08: 15 mL via ORAL
  Filled 2024-03-08: qty 15

## 2024-03-08 MED ORDER — AMLODIPINE BESYLATE 5 MG PO TABS
5.0000 mg | ORAL_TABLET | Freq: Every day | ORAL | Status: DC
  Administered 2024-03-09: 5 mg via ORAL
  Filled 2024-03-08: qty 1

## 2024-03-08 MED ORDER — POTASSIUM CHLORIDE CRYS ER 20 MEQ PO TBCR
40.0000 meq | EXTENDED_RELEASE_TABLET | Freq: Once | ORAL | Status: AC
  Administered 2024-03-08: 40 meq via ORAL
  Filled 2024-03-08: qty 2

## 2024-03-08 MED ORDER — METOPROLOL TARTRATE 5 MG/5ML IV SOLN
5.0000 mg | Freq: Once | INTRAVENOUS | Status: AC
  Administered 2024-03-08: 5 mg via INTRAVENOUS
  Filled 2024-03-08: qty 5

## 2024-03-08 MED ORDER — SODIUM CHLORIDE 0.9 % IV SOLN
100.0000 mg | Freq: Once | INTRAVENOUS | Status: AC
  Administered 2024-03-08: 100 mg via INTRAVENOUS
  Filled 2024-03-08: qty 100

## 2024-03-08 MED ORDER — ALUM & MAG HYDROXIDE-SIMETH 200-200-20 MG/5ML PO SUSP
30.0000 mL | Freq: Once | ORAL | Status: DC
  Filled 2024-03-08 (×2): qty 30

## 2024-03-08 MED ORDER — ACETAMINOPHEN 650 MG RE SUPP: 650.0000 mg | Freq: Four times a day (QID) | RECTAL | Status: DC | PRN

## 2024-03-08 MED ORDER — ONDANSETRON HCL 4 MG PO TABS: 4.0000 mg | ORAL_TABLET | Freq: Four times a day (QID) | ORAL | Status: DC | PRN

## 2024-03-08 MED ORDER — ADULT MULTIVITAMIN W/MINERALS CH
1.0000 | ORAL_TABLET | Freq: Every day | ORAL | Status: DC
  Administered 2024-03-09 – 2024-03-10 (×2): 1 via ORAL
  Filled 2024-03-08 (×2): qty 1

## 2024-03-08 MED ORDER — SODIUM CHLORIDE 0.9 % IV SOLN: INTRAVENOUS | Status: DC

## 2024-03-08 MED ORDER — SODIUM CHLORIDE 0.9 % IV SOLN
1.0000 g | Freq: Once | INTRAVENOUS | Status: AC
  Administered 2024-03-08: 1 g via INTRAVENOUS
  Filled 2024-03-08: qty 10

## 2024-03-08 MED ORDER — METRONIDAZOLE 500 MG/100ML IV SOLN
500.0000 mg | Freq: Two times a day (BID) | INTRAVENOUS | Status: DC
  Administered 2024-03-08 – 2024-03-09 (×2): 500 mg via INTRAVENOUS
  Filled 2024-03-08 (×3): qty 100

## 2024-03-08 MED ORDER — ONDANSETRON HCL 4 MG/2ML IJ SOLN: 4.0000 mg | Freq: Four times a day (QID) | INTRAMUSCULAR | Status: DC | PRN

## 2024-03-08 MED ORDER — FLUTICASONE PROPIONATE 50 MCG/ACT NA SUSP: 2.0000 | Freq: Every day | NASAL | Status: DC | PRN

## 2024-03-08 MED ORDER — PANTOPRAZOLE SODIUM 40 MG PO TBEC
40.0000 mg | DELAYED_RELEASE_TABLET | Freq: Every day | ORAL | Status: DC
  Administered 2024-03-09: 40 mg via ORAL
  Filled 2024-03-08: qty 1

## 2024-03-08 MED ORDER — ACETAMINOPHEN 325 MG PO TABS
650.0000 mg | ORAL_TABLET | Freq: Four times a day (QID) | ORAL | Status: DC | PRN
  Administered 2024-03-08 – 2024-03-09 (×2): 650 mg via ORAL
  Filled 2024-03-08 (×2): qty 2

## 2024-03-08 MED ORDER — IOHEXOL 300 MG/ML  SOLN
75.0000 mL | Freq: Once | INTRAMUSCULAR | Status: AC | PRN
  Administered 2024-03-08: 75 mL via INTRAVENOUS

## 2024-03-08 MED ORDER — LORATADINE 10 MG PO TABS
10.0000 mg | ORAL_TABLET | Freq: Every day | ORAL | Status: DC
  Administered 2024-03-09 – 2024-03-10 (×2): 10 mg via ORAL
  Filled 2024-03-08 (×2): qty 1

## 2024-03-08 MED ORDER — POTASSIUM CHLORIDE IN NACL 20-0.9 MEQ/L-% IV SOLN
INTRAVENOUS | Status: AC
  Filled 2024-03-08 (×4): qty 1000

## 2024-03-08 NOTE — ED Notes (Signed)
 ED provider notified pt is asking for something for pain.

## 2024-03-08 NOTE — ED Triage Notes (Signed)
 Pt arrives via [facility] from [facility] for not being at her baseline per pts family member. Pt had a UTI 2 weeks and was prescribed antibiotics which she has been taking. Per facility, pt is at her baseline.  EMS vitals: 151/70 BP 98% on RA 67 HR 14 RR 97.8 F

## 2024-03-08 NOTE — Assessment & Plan Note (Signed)
-   The patient will be placed on IV Rocephin. - Will follow urine culture and sensitivity.

## 2024-03-08 NOTE — H&P (Signed)
 [place]   PATIENT NAME: [name]    MR#:  [id number]  DATE OF BIRTH:  October 07, 1927  DATE OF ADMISSION:  03/08/2024  PRIMARY CARE PHYSICIAN: Jimmy Charlie FERNS, MD   Patient is coming from:  healthcare.  REQUESTING/REFERRING PHYSICIAN: Willo Dunnings, MD  CHIEF COMPLAINT:   Chief Complaint  Patient presents with   Altered Mental Status    HISTORY OF PRESENT ILLNESS:  [name] is a 89 y.o. female with medical history significant for essential hypertension, dyslipidemia, chronic diastolic CHF, PAD, CVA, peripheral neuropathy and osteoarthritis, who presented to the emergency room with acute onset of altered mental status.  The patient's family felt that she was not at her baseline and therefore EMS was called to [facility] where she resides.  She has been receiving IM ertapenem for UTI over the last couple weeks.  She was complaining of abdominal pain as well as nausea and diarrhea.  She has occasional sore throat and chest burning sensation.  No dyspnea or cough or wheezing.  No fever or chills.  ED Course: When the patient came to the ER, BP was 141/80 with temperature 97.5 with otherwise normal vital signs.  Labs revealed borderline potassium of 3.5 and otherwise unremarkable CMP.  High sensitive troponin I was 13 and later 11.  Lactic acid was 1.3.  CBC was normal.  UA was positive for UTI. EKG as reviewed by me : EKG showed normal sinus rhythm with a rate of 63 and multiple premature ventricular and supraventricular complexes and old left bundle branch block. Imaging: Portable chest x-ray showed small right pleural effusion and Stegmayer renal calculi bilaterally.  Noncontrasted head CT scan revealed atrophy and chronic microvascular ischemic changes and old right MCA with no acute intracranial abnormalities. Abdominal and pelvic CT scan with contrast revealed the following: 1. Diffuse circumferential wall thickening and submucosal enhancement of  the colon most significant in the sigmoid colon and rectum compatible with colitis. 2. Cholelithiasis. 3. Likely 7 mm filling defect in the distal common bile duct worrisome for choledocholithiasis. No biliary ductal dilatation. This can be further evaluated with ERCP or MRCP. 4. Moderate right and small left pleural effusions with bibasilar atelectasis. 5. Mild body wall edema. 6. Stable bilateral staghorn calculi. No hydronephrosis. 7. Aortic atherosclerosis.  The patient was given 40 mg p.o. potassium chloride  5 mg of IV Lopressor , 2 g of IV magnesium  sulfate, 1 L bolus of IV lactated ringer  1 g of IV Rocephin  and was ordered IV doxycycline .  The patient will be admitted to a medical telemetry bed for further evaluation and management.  PAST MEDICAL HISTORY:   Past Medical History:  Diagnosis Date   Arrhythmia    Arthritis    Atypical chest pain    Lexiscan myoview (5/11) with EF 84%, normal wall motion, small fixed apical  perfusion defect likely due to breast attenuation, no evidence for ischemia or infarction.  **Patient had an   Basal cell carcinoma 07/28/19 [facility]   R pretibial   Breast cancer (HCC)    Bilateral mastectomies 1986.   CVA (cerebral infarction) 10/12   right lacunar   Depression    Dyspnea    Echo (5/11) was a difficult study due to breast implants but showed normal LV and RV size and systolic function.       GERD (gastroesophageal reflux disease)    Headache    s/p shingles - right side of head   History of partial thyroidectomy  Hyperlipidemia    Hypertension    in past - no current meds/issues   Obstruction of intestine (HCC)    Partial small bowel obstruction (HCC) 7/14   no surgery   Superficial basal cell carcinoma 06/18/2019   R mid to distal pretibial   Wears dentures    full upper   Zoster     with Post-herpetic neuralgia    PAST SURGICAL HISTORY:   Past Surgical History:  Procedure Laterality Date   ABDOMINAL HYSTERECTOMY     BREAST  SURGERY Bilateral    cancer - mastectomy and implant insertion   INTRAMEDULLARY (IM) NAIL INTERTROCHANTERIC Left 06/19/2020   Procedure: INTRAMEDULLARY (IM) NAIL INTERTROCHANTRIC;  Surgeon: Cleotilde Barrio, MD;  Location: [facility];  Service: Orthopedics;  Laterality: Left;   IR THORACENTESIS ASP PLEURAL SPACE W/IMG GUIDE  10/02/2021   MASTECTOMY  07/09/1978   bilateral   NECK SURGERY     TARSORRHAPHY Bilateral 08/16/2015   Procedure: MINOR TARSORRAPHY LATERAL PLACEMENT;  Surgeon: Greig CHRISTELLA Gay, MD;  Location: [facility];  Service: Ophthalmology;  Laterality: Bilateral;   VAGINAL DELIVERY      SOCIAL HISTORY:   Social History   Tobacco Use   Smoking status: Former    Passive exposure: Never   Smokeless tobacco: Never  Substance Use Topics   Alcohol  use: No    Alcohol /week: 0.0 standard drinks of alcohol     FAMILY HISTORY:   Family History  Problem Relation Age of Onset   Heart disease Son        open heart surgery for a blockage   Heart Problems Son    Diabetes Son    Parkinson's disease Son     DRUG ALLERGIES:   Allergies  Allergen Reactions   Duloxetine      REACTION: N/V Mental status change and trouble with balance   Cymbalta  [Duloxetine  Hcl]     Unsure of reaction   Maxitrol [Neomycin -Polymyxin-Dexameth]     Unsure of reaction   Pregabalin     REACTION: SOB, swollen lips   Tobradex [Tobramycin-Dexamethasone]     Unsure of reaction    REVIEW OF SYSTEMS:   ROS As per history of present illness. All pertinent systems were reviewed above. Constitutional, HEENT, cardiovascular, respiratory, GI, GU, musculoskeletal, neuro, psychiatric, endocrine, integumentary and hematologic systems were reviewed and are otherwise negative/unremarkable except for positive findings mentioned above in the HPI.   MEDICATIONS AT HOME:   Prior to Admission medications   Medication Sig Start Date End Date Taking? Authorizing Provider  acetaminophen  (TYLENOL ) 325 MG  tablet Take 2 tablets (650 mg total) by mouth every 6 (six) hours as needed for mild pain (pain score 1-3), moderate pain (pain score 4-6), fever or headache (or Fever >/= 101). 07/11/23   Lenon Marien CROME, MD  acyclovir  (ZOVIRAX ) 200 MG capsule TAKE ONE CAPSULE BY MOUTH TWO TIMES DAILY 10/29/22   Letvak, Richard I, MD  amLODipine  (NORVASC ) 5 MG tablet Take 1 tablet (5 mg total) by mouth daily. 10/29/22   Letvak, Richard I, MD  antiseptic oral rinse (BIOTENE) LIQD 15 mLs by Mouth Rinse route as needed for dry mouth.    [provider]  azelastine  (ASTELIN ) 0.1 % nasal spray Place 2 sprays into both nostrils 2 (two) times daily. 05/20/20   [provider]  feeding supplement (ENSURE ENLIVE / ENSURE PLUS) LIQD Take 237 mLs by mouth 3 (three) times daily between meals. 10/12/21   Amin, Sumayya, MD  fexofenadine (ALLEGRA) 180 MG tablet  Take 180 mg by mouth daily.    [provider]  fluticasone  (FLONASE ) 50 MCG/ACT nasal spray PLACE TWO SPRAYS INTO BOTH NOSTRILS DAILY AS NEEDED 12/24/22   Jimmy Charlie FERNS, MD  Multiple Vitamin (MULTIVITAMIN) tablet Take 1 tablet by mouth once daily    [provider]  omeprazole  (PRILOSEC) 20 MG capsule TAKE ONE CAPSULE BY MOUTH DAILY 10/29/22   Letvak, Richard I, MD  polyethylene glycol (MIRALAX  / GLYCOLAX ) packet Take 17 g by mouth at bedtime.    [provider]  RESTASIS  0.05 % ophthalmic emulsion Place 1 drop into both eyes 2 (two) times daily. 02/24/18   [provider]  topiramate  (TOPAMAX ) 50 MG tablet TAKE ONE TABLET BY MOUTH AT BEDTIME FOR COMPLICATION OF SHINGLES 10/29/22   Jimmy Charlie FERNS, MD      VITAL SIGNS:  Blood pressure (!) 158/71, pulse (!) 58, temperature 98.1 F (36.7 C), resp. rate 14, weight 45.5 kg, SpO2 99%.  PHYSICAL EXAMINATION:  Physical Exam  GENERAL:  89 y.o.-year-old Caucasian female patient lying in the bed with no acute distress.  EYES: Pupils equal, round, reactive to light and  accommodation. No scleral icterus. Extraocular muscles intact.  HEENT: Head atraumatic, normocephalic. Oropharynx and nasopharynx clear.  NECK:  Supple, no jugular venous distention. No thyroid  enlargement, no tenderness.  LUNGS: Normal breath sounds bilaterally, no wheezing, rales,rhonchi or crepitation. No use of accessory muscles of respiration.  CARDIOVASCULAR: Regular rate and rhythm, S1, S2 normal. No murmurs, rubs, or gallops.  ABDOMEN: Soft, nondistended, nontender. Bowel sounds present. No organomegaly or mass.  EXTREMITIES: No pedal edema, cyanosis, or clubbing.  NEUROLOGIC: Cranial nerves II through XII are intact. Muscle strength 5/5 in all extremities. Sensation intact. Gait not checked.  PSYCHIATRIC: The patient is alert and oriented x 3.  Normal affect and good eye contact. SKIN: No obvious rash, lesion, or ulcer.   LABORATORY PANEL:   CBC Recent Labs  Lab 03/08/24 2102  WBC 6.2  HGB 12.1  HCT 37.2  PLT 215   ------------------------------------------------------------------------------------------------------------------  Chemistries  Recent Labs  Lab 03/08/24 1612 03/08/24 2102  NA 142  --   K 3.5  --   CL 109  --   CO2 22  --   GLUCOSE 101*  --   BUN 15  --   CREATININE 0.92 0.73  CALCIUM 8.9  --   MG 1.9  --   AST 15  --   ALT 10  --   ALKPHOS 59  --   BILITOT 0.3  --    ------------------------------------------------------------------------------------------------------------------  Cardiac Enzymes No results for input(s): TROPONINI in the last 168 hours. ------------------------------------------------------------------------------------------------------------------  RADIOLOGY:  CT ABDOMEN PELVIS W CONTRAST Result Date: 03/08/2024 CLINICAL DATA:  Acute abdominal pain EXAM: CT ABDOMEN AND PELVIS WITH CONTRAST TECHNIQUE: Multidetector CT imaging of the abdomen and pelvis was performed using the standard protocol following bolus administration  of intravenous contrast. RADIATION DOSE REDUCTION: This exam was performed according to the departmental dose-optimization program which includes automated exposure control, adjustment of the mA and/or kV according to patient size and/or use of iterative reconstruction technique. CONTRAST:  75mL OMNIPAQUE  IOHEXOL  300 MG/ML  SOLN COMPARISON:  CT abdomen and pelvis 11/18/2023. FINDINGS: Lower chest: Moderate right and small left pleural effusions are present with bibasilar atelectasis. Hepatobiliary: Gallstones are again seen. Likely small filling defect measuring 7 mm noted in the distal common bile duct on image 7/23. Common bile duct is nondilated. Rounded hypodensity in the right lobe of  the liver is unchanged and too small to characterize, likely a cyst or hemangioma. No new liver lesions are seen. Pancreas: Pancreas appears within normal limits. Spleen: Normal in size without focal abnormality. Adrenals/Urinary Tract: Bilateral staghorn calculi another separate bilateral renal calculi are again seen similar to prior. There is no hydronephrosis. Bilateral renal cortical cysts are present. The largest is in the inferior pole the right kidney measuring 3.2 cm. The adrenal glands and bladder are within normal limits. Stomach/Bowel: There is diffuse circumferential wall thickening and submucosal enhancement of the colon most significant in the sigmoid colon and rectum. The appendix is not seen. No dilated bowel loops are seen. Stomach and small bowel are within normal limits. No pneumatosis or free air. Vascular/Lymphatic: Aortic atherosclerosis. No enlarged abdominal or pelvic lymph nodes. Reproductive: Status post hysterectomy. No adnexal masses. Other: There is mild body wall edema. Bilateral breast implants are present. Musculoskeletal: The bones are diffusely osteopenic. Left-sided hip screw is present. There stable severe chronic compression deformity of L1. IMPRESSION: 1. Diffuse circumferential wall  thickening and submucosal enhancement of the colon most significant in the sigmoid colon and rectum compatible with colitis. 2. Cholelithiasis. 3. Likely 7 mm filling defect in the distal common bile duct worrisome for choledocholithiasis. No biliary ductal dilatation. This can be further evaluated with ERCP or MRCP. 4. Moderate right and small left pleural effusions with bibasilar atelectasis. 5. Mild body wall edema. 6. Stable bilateral staghorn calculi. No hydronephrosis. 7. Aortic atherosclerosis. Aortic Atherosclerosis (ICD10-I70.0).  No Electronically Signed   By: Greig Pique M.D.   On: 03/08/2024 17:39   CT Head Wo Contrast Result Date: 03/08/2024 CLINICAL DATA:  Mental status change, unknown cause EXAM: CT HEAD WITHOUT CONTRAST TECHNIQUE: Contiguous axial images were obtained from the base of the skull through the vertex without intravenous contrast. RADIATION DOSE REDUCTION: This exam was performed according to the departmental dose-optimization program which includes automated exposure control, adjustment of the mA and/or kV according to patient size and/or use of iterative reconstruction technique. COMPARISON:  07/04/2023. FINDINGS: Brain: There is periventricular white matter decreased attenuation consistent with small vessel ischemic changes. Ventricles, sulci and cisterns are prominent consistent with age related involutional changes. No acute intracranial hemorrhage, mass effect or shift. No hydrocephalus. Encephalomalacia in the right frontotemporal region consistent with chronic MCA CVA. Vascular: No hyperdense vessel or unexpected calcification. Skull: Normal. Negative for fracture or focal lesion. Sinuses/Orbits: No acute finding. IMPRESSION: 1. Atrophy, chronic microvascular ischemic changes and old right MCA CVA. 2. No acute intracranial process identified. Electronically Signed   By: Fonda Field M.D.   On: 03/08/2024 17:31   DG Chest Portable 1 View Result Date: 03/08/2024 CLINICAL  DATA:  Chest pain and shortness of breath. EXAM: PORTABLE CHEST 1 VIEW COMPARISON:  11/18/2023. FINDINGS: The heart size and mediastinal contours are within normal limits. There is atherosclerotic calcification of the aorta. Hyperinflation of the lungs is noted. Apical pleural scarring and interstitial prominence is noted bilaterally. There is a small right pleural effusion. No pneumothorax is seen. Cervical spinal fusion hardware is noted. There is stable bony deformity of the proximal right humerus. Staghorn calculi are present in the kidneys bilaterally. IMPRESSION: 1. Small right pleural effusion. 2. Staghorn renal calculi bilaterally. Electronically Signed   By: Leita Birmingham M.D.   On: 03/08/2024 16:27      IMPRESSION AND PLAN:  Assessment and Plan: * Acute metabolic encephalopathy - This is likely multifactorial due to acute lower UTI as well as acute  sigmoid colitis. - The patient will be admitted to a medical telemetry bed. - Will follow neurochecks every 4 hours for 24 hours. - Management otherwise as below  Acute lower UTI - The patient will be placed on IV Rocephin . - Will follow urine culture and sensitivity.  Acute colitis - The patient will be placed on IV Rocephin  and Flagyl . - Pain management will be provided.  Essential hypertension - Will continue antihypertensive therapy.  GERD without esophagitis Will continue PPI therapy.   DVT prophylaxis: Lovenox . Advanced Care Planning:  Code Status: The patient is DNR and DNI. Family Communication:  The plan of care was discussed in details with the patient (and family). I answered all questions. The patient agreed to proceed with the above mentioned plan. Further management will depend upon hospital course. Disposition Plan: Back to previous home environment Consults called: none. All the records are reviewed and case discussed with ED provider.  Status is: Inpatient  At the time of the admission, it appears that the  appropriate admission status for this patient is inpatient.  This is judged to be reasonable and necessary in order to provide the required intensity of service to ensure the patient's safety given the presenting symptoms, physical exam findings and initial radiographic and laboratory data in the context of comorbid conditions.  The patient requires inpatient status due to high intensity of service, high risk of further deterioration and high frequency of surveillance required.  I certify that at the time of admission, it is my clinical judgment that the patient will require inpatient hospital care extending more than 2 midnights.                            Dispo: The patient is from: Home              Anticipated d/c is to: Home              Patient currently is not medically stable to d/c.              Difficult to place patient: No  Madison DELENA Peaches M.D on 03/08/2024 at 10:43 PM  [facility]   From 7 PM-7 AM, contact night-coverage [email or web address]  CC: Primary care physician; Jimmy Charlie FERNS, MD

## 2024-03-08 NOTE — Assessment & Plan Note (Signed)
-   Will continue antihypertensive therapy.

## 2024-03-08 NOTE — Assessment & Plan Note (Signed)
-   The patient will be placed on IV Rocephin  and Flagyl . - Pain management will be provided.

## 2024-03-08 NOTE — ED Notes (Signed)
 Phlebotomy called to obtain labs on pt

## 2024-03-08 NOTE — Assessment & Plan Note (Signed)
-   This is likely multifactorial due to acute lower UTI as well as acute sigmoid colitis. - The patient will be admitted to a medical telemetry bed. - Will follow neurochecks every 4 hours for 24 hours. - Management otherwise as below

## 2024-03-08 NOTE — ED Notes (Signed)
 Pt brief changed by ED tech

## 2024-03-08 NOTE — Progress Notes (Signed)
 PHARMACIST - PHYSICIAN COMMUNICATION  CONCERNING:  Enoxaparin  (Lovenox ) for DVT Prophylaxis    RECOMMENDATION: Patient was prescribed enoxaprin 40mg  q24 hours for VTE prophylaxis.   Filed Weights   03/08/24 2010  Weight: 45.5 kg (100 lb 5 oz)    Body mass index is 16.19 kg/m.  Estimated Creatinine Clearance: 26.9 mL/min (by C-G formula based on SCr of 0.92 mg/dL).   Patient is candidate for enoxaparin  30mg  every 24 hours based on CrCl <74ml/min or Weight <45kg  DESCRIPTION: Pharmacy has adjusted enoxaparin  dose per [facility] policy.  Patient is now receiving enoxaparin  30 mg every 24 hours    Annabella LOISE Banks, PharmD Clinical Pharmacist  03/08/2024 8:23 PM

## 2024-03-08 NOTE — ED Notes (Signed)
Pt brief changed at this time

## 2024-03-08 NOTE — ED Notes (Signed)
 ED provider aware of elevated HR

## 2024-03-08 NOTE — ED Provider Notes (Addendum)
 [facility] Provider Note    Event Date/Time   First MD Initiated Contact with Patient 03/08/24 1553     (approximate)   History   Chief Complaint Altered Mental Status   HPI  [name] is a 89 y.o. female with past medical history of hypertension, stroke, SVT, HFpEF, and PAD who presents to the ED for altered mental status.  Patient arrives via EMS from [facility] after family member reported that they were concerned she was not at her baseline.  Staff at the facility had reported to EMS that she is at her baseline but she has apparently been receiving IM ertapenem for UTI for the past 2 weeks.  On arrival, patient complains of abdominal pain, diarrhea, and nausea.  She also reports occasional burning pain in her chest and throat, denies difficulty breathing.     Physical Exam   Triage Vital Signs: ED Triage Vitals [03/08/24 1546]  Encounter Vitals Group     BP (!) 141/80     Girls Systolic BP Percentile      Girls Diastolic BP Percentile      Boys Systolic BP Percentile      Boys Diastolic BP Percentile      Pulse Rate 72     Resp 16     Temp (!) 97.5 F (36.4 C)     Temp Source Oral     SpO2 99 %     Weight      Height      Head Circumference      Peak Flow      Pain Score      Pain Loc      Pain Education      Exclude from Growth Chart     Most recent vital signs: Vitals:   03/08/24 1745 03/08/24 1800  BP: 136/88 125/89  Pulse: 73 72  Resp: 13 20  Temp:    SpO2: 100% 98%    Constitutional: Alert and oriented to person, place, time, but not situation. Eyes: Conjunctivae are normal. Head: Atraumatic. Nose: No congestion/rhinnorhea. Mouth/Throat: Mucous membranes are moist.  Cardiovascular: Normal rate, regular rhythm. Grossly normal heart sounds.  2+ radial pulses bilaterally. Respiratory: Normal respiratory effort.  No retractions. Lungs CTAB. Gastrointestinal: Soft and diffusely tender to palpation with no  rebound or guarding. No distention. Musculoskeletal: No lower extremity tenderness nor edema.  Neurologic:  Normal speech and language. No gross focal neurologic deficits are appreciated.    ED Results / Procedures / Treatments   Labs (all labs ordered are listed, but only abnormal results are displayed) Labs Reviewed  COMPREHENSIVE METABOLIC PANEL WITH GFR - Abnormal; Notable for the following components:      Result Value   Glucose, Bld 101 (*)    GFR, Estimated 58 (*)    All other components within normal limits  URINALYSIS, ROUTINE W REFLEX MICROSCOPIC - Abnormal; Notable for the following components:   Color, Urine YELLOW (*)    APPearance HAZY (*)    Hgb urine dipstick LARGE (*)    Protein, ur 30 (*)    Leukocytes,Ua MODERATE (*)    Bacteria, UA RARE (*)    All other components within normal limits  CULTURE, BLOOD (ROUTINE X 2)  CULTURE, BLOOD (ROUTINE X 2)  URINE CULTURE  CBC WITH DIFFERENTIAL/PLATELET  MAGNESIUM   LACTIC ACID, PLASMA  TROPONIN I (HIGH SENSITIVITY)  TROPONIN I (HIGH SENSITIVITY)     EKG  ED ECG REPORT I, Carlin  Janeece Blok, the attending physician, personally viewed and interpreted this ECG.   Date: 03/08/2024  EKG Time: 16:28  Rate: 84  Rhythm: Sinus arrhythmia with frequent PVCs  Axis: Normal  Intervals:nonspecific intraventricular conduction delay  ST&T Change: None  RADIOLOGY CT head reviewed and interpreted by me with no hemorrhage or midline shift.  PROCEDURES:  Critical Care performed: No  Procedures   MEDICATIONS ORDERED IN ED: Medications  cefTRIAXone  (ROCEPHIN ) 1 g in sodium chloride  0.9 % 100 mL IVPB (1 g Intravenous New Bag/Given 03/08/24 1923)  doxycycline  (VIBRAMYCIN ) 100 mg in sodium chloride  0.9 % 250 mL IVPB (has no administration in time range)  alum & mag hydroxide-simeth (MAALOX/MYLANTA) 200-200-20 MG/5ML suspension 30 mL (has no administration in time range)    And  lidocaine  (XYLOCAINE ) 2 % viscous mouth solution  15 mL (15 mLs Oral Given 03/08/24 1917)  lactated ringers  bolus 1,000 mL (0 mLs Intravenous Stopped 03/08/24 1923)  potassium chloride  SA (KLOR-CON  M) CR tablet 40 mEq (40 mEq Oral Given 03/08/24 1740)  magnesium  sulfate IVPB 2 g 50 mL (0 g Intravenous Stopped 03/08/24 1920)  iohexol  (OMNIPAQUE ) 300 MG/ML solution 75 mL (75 mLs Intravenous Contrast Given 03/08/24 1706)  metoprolol  tartrate (LOPRESSOR ) injection 5 mg (5 mg Intravenous Given 03/08/24 1821)     IMPRESSION / MDM / ASSESSMENT AND PLAN / ED COURSE  I reviewed the triage vital signs and the nursing notes.                              89 y.o. female with past medical history of hypertension, stroke, HFpEF, PAD, and SVT who presents to the ED due to family's reported concern for change in mental status, has been receiving 2 weeks of IM antibiotics.  Patient's presentation is most consistent with acute presentation with potential threat to life or bodily function.  Differential diagnosis includes, but is not limited to, sepsis, UTI, pyelonephritis, kidney stone, stroke, anemia, electrolyte abnormality, AKI, ACS, arrhythmia.  Patient nontoxic-appearing and in no acute distress, vital signs initially unremarkable but patient suddenly became tachycardic to the 140s during my evaluation.  We were unable to capture an EKG but this appeared consistent with SVT versus atrial fibrillation with RVR.  Patient spontaneously converted back to a sinus rhythm with heart rate in the 80s, will hydrate with IV fluids and check for electrolyte abnormality.  We will check CT head, chest x-ray, and CT of her abdomen/pelvis.  Labs without significant anemia, leukocytosis, electrolyte abnormality, or AKI.  LFTs are unremarkable, 2 sets of troponin within normal limits, lactic acid also reassuring.  Urinalysis does appear concerning for ongoing UTI despite recent IM ertapenem, will send for culture.  CT head is negative for acute process, CT of abdomen/pelvis shows  colitis as well as bilateral staghorn calculi.  Case discussed with Dr. Sherrilee of urology and no surgical intervention needed for staghorn calculi with infection, does recommend IV doxycycline  for antibiotic coverage and we will also treat with IV Rocephin .  There is also possible choledocholithiasis without associated biliary dilatation or abnormal LFTs.  This finding was discussed with Dr. Unk from GI, who does not feel MRCP indicated given stone does not seem to be obstructing.  Episodes of tachycardia have also significantly decreased following dose of IV metoprolol .  She does recommend trending LFTs, case discussed with hospitalist for admission.      FINAL CLINICAL IMPRESSION(S) / ED DIAGNOSES   Final diagnoses:  Altered  mental status, unspecified altered mental status type  Acute cystitis without hematuria  Staghorn kidney stones  Colitis     Rx / DC Orders   ED Discharge Orders     None        Note:  This document was prepared using Dragon voice recognition software and may include unintentional dictation errors.   Willo Dunnings, MD 03/08/24 RANDALL    Willo Dunnings, MD 03/08/24 [phone]

## 2024-03-08 NOTE — Assessment & Plan Note (Signed)
-   Will continue PPI therapy.

## 2024-03-09 DIAGNOSIS — G9341 Metabolic encephalopathy: Secondary | ICD-10-CM | POA: Diagnosis not present

## 2024-03-09 DIAGNOSIS — R4182 Altered mental status, unspecified: Secondary | ICD-10-CM | POA: Diagnosis not present

## 2024-03-09 DIAGNOSIS — Z7189 Other specified counseling: Secondary | ICD-10-CM | POA: Diagnosis not present

## 2024-03-09 DIAGNOSIS — Z515 Encounter for palliative care: Secondary | ICD-10-CM

## 2024-03-09 DIAGNOSIS — I10 Essential (primary) hypertension: Secondary | ICD-10-CM | POA: Diagnosis not present

## 2024-03-09 DIAGNOSIS — N390 Urinary tract infection, site not specified: Secondary | ICD-10-CM | POA: Diagnosis not present

## 2024-03-09 DIAGNOSIS — K529 Noninfective gastroenteritis and colitis, unspecified: Secondary | ICD-10-CM | POA: Diagnosis not present

## 2024-03-09 LAB — BASIC METABOLIC PANEL WITH GFR
Anion gap: 10 (ref 5–15)
BUN: 11 mg/dL (ref 8–23)
CO2: 21 mmol/L — ABNORMAL LOW (ref 22–32)
Calcium: 8.2 mg/dL — ABNORMAL LOW (ref 8.9–10.3)
Chloride: 110 mmol/L (ref 98–111)
Creatinine, Ser: 0.53 mg/dL (ref 0.44–1.00)
GFR, Estimated: >60 mL/min (ref >60)
Glucose, Bld: 101 mg/dL — ABNORMAL HIGH (ref 70–99)
Potassium: 3.4 mmol/L — ABNORMAL LOW (ref 3.5–5.1)
Sodium: 141 mmol/L (ref 135–145)

## 2024-03-09 LAB — CBC
HCT: 38.4 % (ref 36.0–46.0)
Hemoglobin: 12.2 g/dL (ref 12.0–15.0)
MCH: 30.7 pg (ref 26.0–34.0)
MCHC: 31.8 g/dL (ref 30.0–36.0)
MCV: 96.7 fL (ref 80.0–100.0)
Platelets: 228 K/uL (ref 150–400)
RBC: 3.97 MIL/uL (ref 3.87–5.11)
RDW: 14.7 % (ref 11.5–15.5)
WBC: 6.1 K/uL (ref 4.0–10.5)
nRBC: 0 % (ref 0.0–0.2)

## 2024-03-09 MED ORDER — SODIUM CHLORIDE 0.9 % IV SOLN: INTRAVENOUS | Status: DC

## 2024-03-09 MED ORDER — ACETAMINOPHEN 650 MG RE SUPP: 650.0000 mg | Freq: Four times a day (QID) | RECTAL | Status: DC | PRN

## 2024-03-09 MED ORDER — MORPHINE SULFATE (PF) 2 MG/ML IV SOLN: 0.5000 mg | INTRAVENOUS | Status: DC | PRN

## 2024-03-09 MED ORDER — ACETAMINOPHEN 325 MG PO TABS
650.0000 mg | ORAL_TABLET | Freq: Four times a day (QID) | ORAL | Status: DC | PRN
  Administered 2024-03-09 – 2024-03-10 (×2): 650 mg via ORAL
  Filled 2024-03-09 (×2): qty 2

## 2024-03-09 MED ORDER — LORAZEPAM 2 MG/ML IJ SOLN: 0.5000 mg | INTRAMUSCULAR | Status: DC | PRN

## 2024-03-09 MED ORDER — POLYVINYL ALCOHOL 1.4 % OP SOLN: 1.0000 [drp] | Freq: Four times a day (QID) | OPHTHALMIC | Status: DC | PRN

## 2024-03-09 NOTE — Progress Notes (Signed)
 [facility] Room 106- [facility] Liaison Note  Received request from Transitions of Care Manger for family interest in the Hospice Home. Spoke with son to confirm interest and explain services. Patient is not IPU appropriate at this time.  Son agreeable to patient discharging back to [facility] tomorrow with Hospice follow up.  Referral submitted for hospice services after patient returns to the facility.  Hospital team aware.    Please call with any Hospice related questions or concerns.  Thank you for the opportunity to participate in this patient's  care.      Marinell Nova,  [facility] Liaison [phone]

## 2024-03-09 NOTE — Plan of Care (Signed)

## 2024-03-09 NOTE — IPAL (Signed)
  Interdisciplinary Goals of Care Family Meeting   Date carried out: 03/09/2024  Location of the meeting: Bedside  Member's involved: Physician and Family Member or next of kin  Durable Power of Attorney or Environmental health practitioner: son (DELAWARE)    Discussion: We discussed goals of care for [employer]   Code status:   Code Status: Do not attempt resuscitation (DNR) - Comfort care   Disposition: In-patient comfort care, AHS with Hospice vs Hospice Home   Time spent for the meeting: 35 mins    Cresencio Fairly, MD  03/09/2024, 12:30 PM

## 2024-03-09 NOTE — TOC Progression Note (Addendum)
 Transition of Care [facility]) - Progression Note    Patient Details  Name: [name] MRN: [id number] Date of Birth: 09/11/1927  [facility] CM/SW Contact  Marinda Cooks, RN Phone Number: 03/09/2024, 1:31 PM  Clinical Narrative:     This CM updated that pt's family wants to pursue Hospice services. This CM spoke with pt's Son [name] introduced role and discussed dc planning, recommendation for Hospice band provided choice. Pt's son verbalized understanding and expressed he would like to move forward with [facility]. This updated medical team of this information. TOC will cont to follow dc planning / care coordination and update as applicable.    16:08- This CM updated by Marinell Nova liaison with [facility] he spoke with pt's son and tentative dc plan for pt is  to return to [facility] tomorrow with Hospice follow up. Referral submitted. TOC will cont to follow dc planning / care coordination and update as applicable.                Expected Discharge Plan and Services    TBD    Social Drivers of Health (SDOH) Interventions SDOH Screenings   Food Insecurity: No Food Insecurity (03/08/2024)  Housing: Low Risk  (03/08/2024)  Transportation Needs: No Transportation Needs (03/08/2024)  Utilities: Not At Risk (03/08/2024)  Depression (PHQ2-9): Low Risk  (10/29/2022)  Social Connections: Patient Unable To Answer (07/09/2023)  Tobacco Use: Medium Risk (03/08/2024)    Readmission Risk Interventions     No data to display

## 2024-03-09 NOTE — Progress Notes (Signed)
Per Dr Sherryll Burger, dc tele monitoring

## 2024-03-09 NOTE — Progress Notes (Signed)
 PROGRESS NOTE    [name]  FMW:[id number] DOB: May 16, 1928 DOA: 03/08/2024 PCP: Jimmy Charlie FERNS, MD   Brief Narrative:   89 y.o. Caucasian female with medical history significant for essential hypertension, dyslipidemia, chronic diastolic CHF, PAD, CVA, peripheral neuropathy and osteoarthritis, who presented to the emergency room with acute onset of altered mental status   9/1: hospice eval   Assessment & Plan:   Principal Problem:   Acute metabolic encephalopathy Active Problems:   Acute lower UTI   Acute colitis   Essential hypertension   Goals of care, counseling/discussion   GERD without esophagitis   Altered mental status   * Acute metabolic encephalopathy UTI ruled out  Acute colitis ruled out  Essential hypertension GERD without esophagitis   Full comfort care only per family     DVT prophylaxis: None - comfort care      Code Status: DNR/Comfort care Family Communication: d/w patient's son/POA and wife at bedside Disposition Plan: back to [facility] tomorrow with Hospice     Subjective:  Minimally verbal and confused  Objective: Vitals:   03/09/24 0414 03/09/24 0740 03/09/24 1245 03/09/24 1633  BP: (!) 162/87 (!) 175/70 (!) 147/69 (!) 149/67  Pulse: 62 67 72 67  Resp: 18  16 16   Temp: 98.1 F (36.7 C) 97.6 F (36.4 C) 97.6 F (36.4 C) 98 F (36.7 C)  TempSrc:  Oral Oral Oral  SpO2: 97% 98% 98% 96%  Weight:        Intake/Output Summary (Last 24 hours) at 03/09/2024 1817 Last data filed at 03/09/2024 0900 Gross per 24 hour  Intake 894.32 ml  Output --  Net 894.32 ml   Filed Weights   03/08/24 2010 03/08/24 2139  Weight: 45.5 kg 44.3 kg    Examination:  General exam: Appears calm and comfortable  Respiratory system: Clear to auscultation. Respiratory effort normal. Cardiovascular system: S1 & S2 heard, RRR. No JVD, murmurs,  No pedal edema. Gastrointestinal system: Abdomen is soft, benign Central nervous system: Alert and awake,  pleasantly confused. No focal neurological deficits. Extremities: Symmetric 5 x 5 power. Skin: No rashes, lesions or ulcers    Data Reviewed: I have personally reviewed following labs and imaging studies  CBC: Recent Labs  Lab 03/08/24 1612 03/08/24 2102 03/09/24 0433  WBC 7.5 6.2 6.1  NEUTROABS 4.2  --   --   HGB 13.0 12.1 12.2  HCT 40.4 37.2 38.4  MCV 95.7 94.7 96.7  PLT 248 215 228   Basic Metabolic Panel: Recent Labs  Lab 03/08/24 1612 03/08/24 2102 03/09/24 0433  NA 142  --  141  K 3.5  --  3.4*  CL 109  --  110  CO2 22  --  21*  GLUCOSE 101*  --  101*  BUN 15  --  11  CREATININE 0.92 0.73 0.53  CALCIUM 8.9  --  8.2*  MG 1.9  --   --    GFR: Estimated Creatinine Clearance: 30.1 mL/min (by C-G formula based on SCr of 0.53 mg/dL). Liver Function Tests: Recent Labs  Lab 03/08/24 1612  AST 15  ALT 10  ALKPHOS 59  BILITOT 0.3  PROT 7.3  ALBUMIN 3.6    Sepsis Labs: Recent Labs  Lab 03/08/24 1612  LATICACIDVEN 1.3    Recent Results (from the past 240 hours)  Culture, blood (routine x 2)     Status: None (Preliminary result)   Collection Time: 03/08/24  4:12 PM   Specimen: BLOOD  Result Value Ref Range Status   Specimen Description BLOOD BLOOD LEFT ARM  Final   Special Requests   Final    BOTTLES DRAWN AEROBIC AND ANAEROBIC Blood Culture results may not be optimal due to an inadequate volume of blood received in culture bottles   Culture   Final    NO GROWTH < 24 HOURS Performed at [facility], [address], [place], [place] [address]    Report Status PENDING  Incomplete  Culture, blood (routine x 2)     Status: None (Preliminary result)   Collection Time: 03/08/24  4:44 PM   Specimen: BLOOD RIGHT HAND  Result Value Ref Range Status   Specimen Description BLOOD RIGHT HAND  Final   Special Requests   Final    BOTTLES DRAWN AEROBIC ONLY Blood Culture adequate volume   Culture   Final    NO GROWTH < 12 HOURS Performed at [facility], [address], [place], [place] [address]    Report Status PENDING  Incomplete         Radiology Studies: CT ABDOMEN PELVIS W CONTRAST Result Date: 03/08/2024 CLINICAL DATA:  Acute abdominal pain EXAM: CT ABDOMEN AND PELVIS WITH CONTRAST TECHNIQUE: Multidetector CT imaging of the abdomen and pelvis was performed using the standard protocol following bolus administration of intravenous contrast. RADIATION DOSE REDUCTION: This exam was performed according to the departmental dose-optimization program which includes automated exposure control, adjustment of the mA and/or kV according to patient size and/or use of iterative reconstruction technique. CONTRAST:  75mL OMNIPAQUE  IOHEXOL  300 MG/ML  SOLN COMPARISON:  CT abdomen and pelvis 11/18/2023. FINDINGS: Lower chest: Moderate right and small left pleural effusions are present with bibasilar atelectasis. Hepatobiliary: Gallstones are again seen. Likely small filling defect measuring 7 mm noted in the distal common bile duct on image 7/23. Common bile duct is nondilated. Rounded hypodensity in the right lobe of the liver is unchanged and too small to characterize, likely a cyst or hemangioma. No new liver lesions are seen. Pancreas: Pancreas appears within normal limits. Spleen: Normal in size without focal abnormality. Adrenals/Urinary Tract: Bilateral staghorn calculi another separate bilateral renal calculi are again seen similar to prior. There is no hydronephrosis. Bilateral renal cortical cysts are present. The largest is in the inferior pole the right kidney measuring 3.2 cm. The adrenal glands and bladder are within normal limits. Stomach/Bowel: There is diffuse circumferential wall thickening and submucosal enhancement of the colon most significant in the sigmoid colon and rectum. The appendix is not seen. No dilated bowel loops are seen. Stomach and small bowel are within normal limits. No pneumatosis or free air. Vascular/Lymphatic:  Aortic atherosclerosis. No enlarged abdominal or pelvic lymph nodes. Reproductive: Status post hysterectomy. No adnexal masses. Other: There is mild body wall edema. Bilateral breast implants are present. Musculoskeletal: The bones are diffusely osteopenic. Left-sided hip screw is present. There stable severe chronic compression deformity of L1. IMPRESSION: 1. Diffuse circumferential wall thickening and submucosal enhancement of the colon most significant in the sigmoid colon and rectum compatible with colitis. 2. Cholelithiasis. 3. Likely 7 mm filling defect in the distal common bile duct worrisome for choledocholithiasis. No biliary ductal dilatation. This can be further evaluated with ERCP or MRCP. 4. Moderate right and small left pleural effusions with bibasilar atelectasis. 5. Mild body wall edema. 6. Stable bilateral staghorn calculi. No hydronephrosis. 7. Aortic atherosclerosis. Aortic Atherosclerosis (ICD10-I70.0).  No Electronically Signed   By: Greig Pique M.D.   On:  03/08/2024 17:39   CT Head Wo Contrast Result Date: 03/08/2024 CLINICAL DATA:  Mental status change, unknown cause EXAM: CT HEAD WITHOUT CONTRAST TECHNIQUE: Contiguous axial images were obtained from the base of the skull through the vertex without intravenous contrast. RADIATION DOSE REDUCTION: This exam was performed according to the departmental dose-optimization program which includes automated exposure control, adjustment of the mA and/or kV according to patient size and/or use of iterative reconstruction technique. COMPARISON:  07/04/2023. FINDINGS: Brain: There is periventricular white matter decreased attenuation consistent with small vessel ischemic changes. Ventricles, sulci and cisterns are prominent consistent with age related involutional changes. No acute intracranial hemorrhage, mass effect or shift. No hydrocephalus. Encephalomalacia in the right frontotemporal region consistent with chronic MCA CVA. Vascular: No  hyperdense vessel or unexpected calcification. Skull: Normal. Negative for fracture or focal lesion. Sinuses/Orbits: No acute finding. IMPRESSION: 1. Atrophy, chronic microvascular ischemic changes and old right MCA CVA. 2. No acute intracranial process identified. Electronically Signed   By: Fonda Field M.D.   On: 03/08/2024 17:31   DG Chest Portable 1 View Result Date: 03/08/2024 CLINICAL DATA:  Chest pain and shortness of breath. EXAM: PORTABLE CHEST 1 VIEW COMPARISON:  11/18/2023. FINDINGS: The heart size and mediastinal contours are within normal limits. There is atherosclerotic calcification of the aorta. Hyperinflation of the lungs is noted. Apical pleural scarring and interstitial prominence is noted bilaterally. There is a small right pleural effusion. No pneumothorax is seen. Cervical spinal fusion hardware is noted. There is stable bony deformity of the proximal right humerus. Staghorn calculi are present in the kidneys bilaterally. IMPRESSION: 1. Small right pleural effusion. 2. Staghorn renal calculi bilaterally. Electronically Signed   By: Leita Birmingham M.D.   On: 03/08/2024 16:27        Scheduled Meds:  alum & mag hydroxide-simeth  30 mL Oral Once   azelastine   2 spray Each Nare BID   cycloSPORINE   1 drop Both Eyes BID   loratadine   10 mg Oral Daily   multivitamin with minerals  1 tablet Oral Daily   Continuous Infusions:  sodium chloride  20 mL/hr at 03/09/24 1305   0.9 % NaCl with KCl 20 mEq / L 100 mL/hr at 03/09/24 0415     LOS: 1 day    Time spent: 35 mins    Erico Stan Maree, MD [facility] Pager [phone]  If 7PM-7AM, please contact night-coverage [email or web address]  03/09/2024, 6:17 PM

## 2024-03-09 NOTE — Discharge Summary (Signed)
 Physician Discharge Summary   Patient: [name] MRN: [id number] DOB: 26-Nov-1927  Admit date:     03/08/2024  Discharge date: {dischdate:26783}  Discharge Physician: Cresencio Fairly   PCP: Jimmy Charlie FERNS, MD   Recommendations at discharge:  {Tip this will not be part of the note when signed- Example include specific recommendations for outpatient follow-up, pending tests to follow-up on. (Optional):26781}  Hospice   Discharge Diagnoses: Principal Problem:   Acute metabolic encephalopathy Active Problems:   Acute lower UTI   Acute colitis   Essential hypertension   Goals of care, counseling/discussion   GERD without esophagitis   Altered mental status   Hospice care patient   Hospital Course: Assessment and Plan:  89 y.o. Caucasian female with medical history significant for essential hypertension, dyslipidemia, chronic diastolic CHF, PAD, CVA, peripheral neuropathy and osteoarthritis, who presented to the emergency room with acute onset of altered mental status    9/1: hospice eval   Acute metabolic encephalopathy UTI ruled out  Acute colitis ruled out  Essential hypertension GERD without esophagitis     Going to [facility] with Hospice    {Tip this will not be part of the note when signed Body mass index is 15.76 kg/m. , ,  (Optional):26781}  {(NOTE) Pain control PDMP Statment (Optional):26782}  Disposition: Hospice care Diet recommendation:  Regular diet DISCHARGE MEDICATION: Allergies as of 03/09/2024       Reactions   Duloxetine     REACTION: N/V Mental status change and trouble with balance   Cymbalta  [duloxetine  Hcl]    Unsure of reaction   Maxitrol [neomycin -polymyxin-dexameth]    Unsure of reaction   Pregabalin    REACTION: SOB, swollen lips   Tobradex [tobramycin-dexamethasone]    Unsure of reaction     Med Rec must be completed prior to using this SMARTLINK***       Follow-up Information     Jimmy Charlie FERNS, MD Follow up.    Specialties: Internal Medicine, Pediatrics Why: hospital follow up Contact information: [address] [place] [address] [phone]                Discharge Exam: [name]   03/08/24 2010 03/08/24 2139  Weight: 45.5 kg 44.3 kg   General exam: Appears calm and comfortable  Respiratory system: Clear to auscultation. Respiratory effort normal. Cardiovascular system: S1 & S2 heard, RRR. No JVD, murmurs,  No pedal edema. Gastrointestinal system: Abdomen is soft, benign Central nervous system: Alert and awake, pleasantly confused. No focal neurological deficits. Extremities: Symmetric 5 x 5 power. Skin: No rashes, lesions or ulcers  Condition at discharge: poor  The results of significant diagnostics from this hospitalization (including imaging, microbiology, ancillary and laboratory) are listed below for reference.   Imaging Studies: CT ABDOMEN PELVIS W CONTRAST Result Date: 03/08/2024 CLINICAL DATA:  Acute abdominal pain EXAM: CT ABDOMEN AND PELVIS WITH CONTRAST TECHNIQUE: Multidetector CT imaging of the abdomen and pelvis was performed using the standard protocol following bolus administration of intravenous contrast. RADIATION DOSE REDUCTION: This exam was performed according to the departmental dose-optimization program which includes automated exposure control, adjustment of the mA and/or kV according to patient size and/or use of iterative reconstruction technique. CONTRAST:  75mL OMNIPAQUE  IOHEXOL  300 MG/ML  SOLN COMPARISON:  CT abdomen and pelvis 11/18/2023. FINDINGS: Lower chest: Moderate right and small left pleural effusions are present with bibasilar atelectasis. Hepatobiliary: Gallstones are again seen. Likely small filling defect measuring 7 mm noted in the distal common  bile duct on image 7/23. Common bile duct is nondilated. Rounded hypodensity in the right lobe of the liver is unchanged and too small to characterize, likely a cyst or hemangioma. No  new liver lesions are seen. Pancreas: Pancreas appears within normal limits. Spleen: Normal in size without focal abnormality. Adrenals/Urinary Tract: Bilateral staghorn calculi another separate bilateral renal calculi are again seen similar to prior. There is no hydronephrosis. Bilateral renal cortical cysts are present. The largest is in the inferior pole the right kidney measuring 3.2 cm. The adrenal glands and bladder are within normal limits. Stomach/Bowel: There is diffuse circumferential wall thickening and submucosal enhancement of the colon most significant in the sigmoid colon and rectum. The appendix is not seen. No dilated bowel loops are seen. Stomach and small bowel are within normal limits. No pneumatosis or free air. Vascular/Lymphatic: Aortic atherosclerosis. No enlarged abdominal or pelvic lymph nodes. Reproductive: Status post hysterectomy. No adnexal masses. Other: There is mild body wall edema. Bilateral breast implants are present. Musculoskeletal: The bones are diffusely osteopenic. Left-sided hip screw is present. There stable severe chronic compression deformity of L1. IMPRESSION: 1. Diffuse circumferential wall thickening and submucosal enhancement of the colon most significant in the sigmoid colon and rectum compatible with colitis. 2. Cholelithiasis. 3. Likely 7 mm filling defect in the distal common bile duct worrisome for choledocholithiasis. No biliary ductal dilatation. This can be further evaluated with ERCP or MRCP. 4. Moderate right and small left pleural effusions with bibasilar atelectasis. 5. Mild body wall edema. 6. Stable bilateral staghorn calculi. No hydronephrosis. 7. Aortic atherosclerosis. Aortic Atherosclerosis (ICD10-I70.0).  No Electronically Signed   By: Greig Pique M.D.   On: 03/08/2024 17:39   CT Head Wo Contrast Result Date: 03/08/2024 CLINICAL DATA:  Mental status change, unknown cause EXAM: CT HEAD WITHOUT CONTRAST TECHNIQUE: Contiguous axial images were  obtained from the base of the skull through the vertex without intravenous contrast. RADIATION DOSE REDUCTION: This exam was performed according to the departmental dose-optimization program which includes automated exposure control, adjustment of the mA and/or kV according to patient size and/or use of iterative reconstruction technique. COMPARISON:  07/04/2023. FINDINGS: Brain: There is periventricular white matter decreased attenuation consistent with small vessel ischemic changes. Ventricles, sulci and cisterns are prominent consistent with age related involutional changes. No acute intracranial hemorrhage, mass effect or shift. No hydrocephalus. Encephalomalacia in the right frontotemporal region consistent with chronic MCA CVA. Vascular: No hyperdense vessel or unexpected calcification. Skull: Normal. Negative for fracture or focal lesion. Sinuses/Orbits: No acute finding. IMPRESSION: 1. Atrophy, chronic microvascular ischemic changes and old right MCA CVA. 2. No acute intracranial process identified. Electronically Signed   By: Fonda Field M.D.   On: 03/08/2024 17:31   DG Chest Portable 1 View Result Date: 03/08/2024 CLINICAL DATA:  Chest pain and shortness of breath. EXAM: PORTABLE CHEST 1 VIEW COMPARISON:  11/18/2023. FINDINGS: The heart size and mediastinal contours are within normal limits. There is atherosclerotic calcification of the aorta. Hyperinflation of the lungs is noted. Apical pleural scarring and interstitial prominence is noted bilaterally. There is a small right pleural effusion. No pneumothorax is seen. Cervical spinal fusion hardware is noted. There is stable bony deformity of the proximal right humerus. Staghorn calculi are present in the kidneys bilaterally. IMPRESSION: 1. Small right pleural effusion. 2. Staghorn renal calculi bilaterally. Electronically Signed   By: Leita Birmingham M.D.   On: 03/08/2024 16:27    Microbiology: Results for orders placed or performed during the  hospital encounter of 03/08/24  Culture, blood (routine x 2)     Status: None (Preliminary result)   Collection Time: 03/08/24  4:12 PM   Specimen: BLOOD  Result Value Ref Range Status   Specimen Description BLOOD BLOOD LEFT ARM  Final   Special Requests   Final    BOTTLES DRAWN AEROBIC AND ANAEROBIC Blood Culture results may not be optimal due to an inadequate volume of blood received in culture bottles   Culture   Final    NO GROWTH < 24 HOURS Performed at [facility], [address] [place], [place] [address]    Report Status PENDING  Incomplete  Culture, blood (routine x 2)     Status: None (Preliminary result)   Collection Time: 03/08/24  4:44 PM   Specimen: BLOOD RIGHT HAND  Result Value Ref Range Status   Specimen Description BLOOD RIGHT HAND  Final   Special Requests   Final    BOTTLES DRAWN AEROBIC ONLY Blood Culture adequate volume   Culture   Final    NO GROWTH < 12 HOURS Performed at [facility], [address] [place], [place] [address]    Report Status PENDING  Incomplete    Labs: CBC: Recent Labs  Lab 03/08/24 1612 03/08/24 2102 03/09/24 0433  WBC 7.5 6.2 6.1  NEUTROABS 4.2  --   --   HGB 13.0 12.1 12.2  HCT 40.4 37.2 38.4  MCV 95.7 94.7 96.7  PLT 248 215 228   Basic Metabolic Panel: Recent Labs  Lab 03/08/24 1612 03/08/24 2102 03/09/24 0433  NA 142  --  141  K 3.5  --  3.4*  CL 109  --  110  CO2 22  --  21*  GLUCOSE 101*  --  101*  BUN 15  --  11  CREATININE 0.92 0.73 0.53  CALCIUM 8.9  --  8.2*  MG 1.9  --   --    Liver Function Tests: Recent Labs  Lab 03/08/24 1612  AST 15  ALT 10  ALKPHOS 59  BILITOT 0.3  PROT 7.3  ALBUMIN 3.6   CBG: No results for input(s): GLUCAP in the last 168 hours.  Discharge time spent: greater than 30 minutes.  Signed: Cresencio Fairly, MD [facility] 03/09/2024

## 2024-03-10 ENCOUNTER — Other Ambulatory Visit: Payer: Self-pay

## 2024-03-10 DIAGNOSIS — R109 Unspecified abdominal pain: Secondary | ICD-10-CM | POA: Diagnosis not present

## 2024-03-10 DIAGNOSIS — G9341 Metabolic encephalopathy: Secondary | ICD-10-CM | POA: Diagnosis not present

## 2024-03-10 DIAGNOSIS — Z7189 Other specified counseling: Secondary | ICD-10-CM | POA: Diagnosis not present

## 2024-03-10 DIAGNOSIS — I10 Essential (primary) hypertension: Secondary | ICD-10-CM | POA: Diagnosis not present

## 2024-03-10 DIAGNOSIS — K219 Gastro-esophageal reflux disease without esophagitis: Secondary | ICD-10-CM | POA: Diagnosis not present

## 2024-03-10 DIAGNOSIS — N390 Urinary tract infection, site not specified: Secondary | ICD-10-CM | POA: Diagnosis not present

## 2024-03-10 DIAGNOSIS — Z515 Encounter for palliative care: Secondary | ICD-10-CM

## 2024-03-10 DIAGNOSIS — G4700 Insomnia, unspecified: Secondary | ICD-10-CM | POA: Diagnosis not present

## 2024-03-10 DIAGNOSIS — Z79899 Other long term (current) drug therapy: Secondary | ICD-10-CM | POA: Diagnosis not present

## 2024-03-10 MED ORDER — MORPHINE SULFATE (CONCENTRATE) 20 MG/ML PO SOLN
5.0000 mg | ORAL | 0 refills | Status: AC | PRN
  Filled 2024-03-10: qty 30, 20d supply, fill #0

## 2024-03-10 MED ORDER — LORAZEPAM 0.5 MG PO TABS
0.5000 mg | ORAL_TABLET | ORAL | 0 refills | Status: AC | PRN
  Filled 2024-03-10: qty 18, 6d supply, fill #0

## 2024-03-10 NOTE — Plan of Care (Signed)

## 2024-03-10 NOTE — NC FL2 (Signed)
 [place]  MEDICAID FL2 LEVEL OF CARE FORM     IDENTIFICATION  Patient Name: [name] Birthdate: 1927/10/18 Sex: female Admission Date (Current Location): 03/08/2024  [facility] and [place] Number:  Chiropodist and Address:  [facility], [address] [place], [place] [address]      Provider Number: [id number]  Attending Physician Name and Address:  Maree Hue, MD  Relative Name and Phone Number:  [place], Heron)  [phone]    Current Level of Care: Hospital Recommended Level of Care: Skilled Nursing Facility Prior Approval Number:    Date Approved/Denied:   PASRR Number: [id number]  Discharge Plan: SNF    Current Diagnoses: Patient Active Problem List   Diagnosis Date Noted   Altered mental status 03/09/2024   Hospice care patient 03/09/2024   Acute metabolic encephalopathy 03/08/2024   Acute lower UTI 03/08/2024   Acute colitis 03/08/2024   Essential hypertension 03/08/2024   GERD without esophagitis 03/08/2024   Closed fracture of right distal radius 07/05/2023   Protein-calorie malnutrition, severe 07/05/2023   Fall at home, initial encounter 07/04/2023   Staghorn calculus 07/04/2023   DNR (do not resuscitate) 07/04/2023   Malnutrition of moderate degree (HCC) 11/20/2021   Anxiety disorder 10/02/2021   Chronic diastolic CHF (congestive heart failure) (HCC) 10/02/2021   SVT (supraventricular tachycardia) (HCC) 10/02/2021   Benzodiazepine dependence (HCC) 04/17/2018   PAD (peripheral artery disease) (HCC) 07/22/2017   Narcotic dependence (HCC) 01/03/2017   Proximal humerus fracture 12/20/2015   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   Postherpetic neuralgia 12/15/2015   Advance directive discussed with patient 10/27/2015   Allergic rhinitis due to pollen 05/20/2015   Goals of care, counseling/discussion 02/22/2014   Microcytic anemia 02/22/2014   Hypertension    UTI (urinary tract infection)  01/15/2011   B12 deficiency 05/03/2009   Neuropathy (HCC) 04/04/2009   Episodic mood disorder (HCC) 06/16/2008   Unspecified glaucoma 11/14/2006   Disturbance in sleep behavior 11/14/2006   Dyslipidemia 08/28/2006   GERD 08/28/2006   Osteoarthritis 08/28/2006   BREAST CANCER, HX OF 08/28/2006    Orientation RESPIRATION BLADDER Height & Weight     Self    Incontinent Weight: 44.3 kg Height:     BEHAVIORAL SYMPTOMS/MOOD NEUROLOGICAL BOWEL NUTRITION STATUS      Incontinent Diet (Regular)  AMBULATORY STATUS COMMUNICATION OF NEEDS Skin   Limited Assist Verbally                         Personal Care Assistance Level of Assistance  Bathing, Feeding, Dressing Bathing Assistance: Limited assistance Feeding assistance: Limited assistance Dressing Assistance: Limited assistance     Functional Limitations Info             SPECIAL CARE FACTORS FREQUENCY                       Contractures      Additional Factors Info  Code Status, Allergies Code Status Info: DNR Allergies Info: Duloxetine ,  Cymbalta , Maxitrol (neomycin -polymyxin-dexameth), Tobradex (tobramycin-dexamethasone) , Pregablin           Current Medications (03/10/2024):  This is the current hospital active medication list Current Facility-Administered Medications  Medication Dose Route Frequency Provider Last Rate Last Admin   0.9 %  sodium chloride  infusion   Intravenous Continuous Maree Hue, MD   Stopped at 03/10/24 0916   acetaminophen  (TYLENOL ) tablet 650 mg  650 mg Oral Q6H PRN Maree,  Vipul, MD   650 mg at 03/10/24 9379   Or   acetaminophen  (TYLENOL ) suppository 650 mg  650 mg Rectal Q6H PRN Maree Hue, MD       alum & mag hydroxide-simeth (MAALOX/MYLANTA) 200-200-20 MG/5ML suspension 30 mL  30 mL Oral Once Willo Dunnings, MD       antiseptic oral rinse (BIOTENE) solution 15 mL  15 mL Mouth Rinse PRN Mansy, Jan A, MD       artificial tears ophthalmic solution 1 drop  1 drop Both Eyes QID PRN  Maree Hue, MD       azelastine  (ASTELIN ) 0.1 % nasal spray 2 spray  2 spray Each Nare BID Mansy, Jan A, MD   2 spray at 03/09/24 2143   cycloSPORINE  (RESTASIS ) 0.05 % ophthalmic emulsion 1 drop  1 drop Both Eyes BID Mansy, Jan A, MD   1 drop at 03/09/24 2143   fluticasone  (FLONASE ) 50 MCG/ACT nasal spray 2 spray  2 spray Each Nare Daily PRN Mansy, Jan A, MD       loratadine  (CLARITIN ) tablet 10 mg  10 mg Oral Daily Mansy, Jan A, MD   10 mg at 03/10/24 9163   LORazepam  (ATIVAN ) injection 0.5 mg  0.5 mg Intravenous Q4H PRN Maree Hue, MD       morphine  (PF) 2 MG/ML injection 0.5 mg  0.5 mg Intravenous Q4H PRN Shah, Vipul, MD       multivitamin with minerals tablet 1 tablet  1 tablet Oral Daily Mansy, Jan A, MD   1 tablet at 03/10/24 9163   ondansetron  (ZOFRAN ) tablet 4 mg  4 mg Oral Q6H PRN Mansy, Jan A, MD       Or   ondansetron  (ZOFRAN ) injection 4 mg  4 mg Intravenous Q6H PRN Mansy, Jan A, MD       traZODone  (DESYREL ) tablet 25 mg  25 mg Oral QHS PRN Mansy, Jan A, MD   25 mg at 03/09/24 2155     Discharge Medications: Please see discharge summary for a list of discharge medications.  Relevant Imaging Results:  Relevant Lab Results:   Additional Information ss [phone]  Dalia GORMAN Fuse, RN

## 2024-03-11 DIAGNOSIS — D649 Anemia, unspecified: Secondary | ICD-10-CM | POA: Diagnosis not present

## 2024-03-11 DIAGNOSIS — Z853 Personal history of malignant neoplasm of breast: Secondary | ICD-10-CM | POA: Diagnosis not present

## 2024-03-11 DIAGNOSIS — G9341 Metabolic encephalopathy: Secondary | ICD-10-CM | POA: Diagnosis not present

## 2024-03-11 DIAGNOSIS — Z515 Encounter for palliative care: Secondary | ICD-10-CM | POA: Diagnosis not present

## 2024-03-11 DIAGNOSIS — F5104 Psychophysiologic insomnia: Secondary | ICD-10-CM | POA: Diagnosis not present

## 2024-03-11 DIAGNOSIS — I10 Essential (primary) hypertension: Secondary | ICD-10-CM | POA: Diagnosis not present

## 2024-03-11 DIAGNOSIS — E785 Hyperlipidemia, unspecified: Secondary | ICD-10-CM | POA: Diagnosis not present

## 2024-03-11 DIAGNOSIS — G629 Polyneuropathy, unspecified: Secondary | ICD-10-CM | POA: Diagnosis not present

## 2024-03-11 LAB — URINE CULTURE: Culture: >=100000 — AB

## 2024-03-13 DIAGNOSIS — G9341 Metabolic encephalopathy: Secondary | ICD-10-CM | POA: Diagnosis not present

## 2024-03-13 DIAGNOSIS — K219 Gastro-esophageal reflux disease without esophagitis: Secondary | ICD-10-CM | POA: Diagnosis not present

## 2024-03-13 DIAGNOSIS — J449 Chronic obstructive pulmonary disease, unspecified: Secondary | ICD-10-CM | POA: Diagnosis not present

## 2024-03-13 DIAGNOSIS — Z79899 Other long term (current) drug therapy: Secondary | ICD-10-CM | POA: Diagnosis not present

## 2024-03-13 DIAGNOSIS — I10 Essential (primary) hypertension: Secondary | ICD-10-CM | POA: Diagnosis not present

## 2024-03-13 DIAGNOSIS — N200 Calculus of kidney: Secondary | ICD-10-CM | POA: Diagnosis not present

## 2024-03-13 DIAGNOSIS — Z853 Personal history of malignant neoplasm of breast: Secondary | ICD-10-CM | POA: Diagnosis not present

## 2024-03-13 LAB — CULTURE, BLOOD (ROUTINE X 2)
Culture: NO GROWTH
Culture: NO GROWTH
Special Requests: ADEQUATE

## 2024-03-20 DIAGNOSIS — K219 Gastro-esophageal reflux disease without esophagitis: Secondary | ICD-10-CM | POA: Diagnosis not present

## 2024-03-20 DIAGNOSIS — G9341 Metabolic encephalopathy: Secondary | ICD-10-CM | POA: Diagnosis not present

## 2024-03-20 DIAGNOSIS — R1313 Dysphagia, pharyngeal phase: Secondary | ICD-10-CM | POA: Diagnosis not present

## 2024-03-20 DIAGNOSIS — I10 Essential (primary) hypertension: Secondary | ICD-10-CM | POA: Diagnosis not present

## 2024-03-26 DIAGNOSIS — G4700 Insomnia, unspecified: Secondary | ICD-10-CM | POA: Diagnosis not present

## 2024-03-26 DIAGNOSIS — I10 Essential (primary) hypertension: Secondary | ICD-10-CM | POA: Diagnosis not present

## 2024-03-26 DIAGNOSIS — K219 Gastro-esophageal reflux disease without esophagitis: Secondary | ICD-10-CM | POA: Diagnosis not present

## 2024-03-26 DIAGNOSIS — M6281 Muscle weakness (generalized): Secondary | ICD-10-CM | POA: Diagnosis not present

## 2024-04-03 DIAGNOSIS — K089 Disorder of teeth and supporting structures, unspecified: Secondary | ICD-10-CM | POA: Diagnosis not present

## 2024-04-03 DIAGNOSIS — Z8673 Personal history of transient ischemic attack (TIA), and cerebral infarction without residual deficits: Secondary | ICD-10-CM | POA: Diagnosis not present

## 2024-04-03 DIAGNOSIS — K219 Gastro-esophageal reflux disease without esophagitis: Secondary | ICD-10-CM | POA: Diagnosis not present

## 2024-04-03 DIAGNOSIS — R1313 Dysphagia, pharyngeal phase: Secondary | ICD-10-CM | POA: Diagnosis not present

## 2024-04-04 DIAGNOSIS — Z79899 Other long term (current) drug therapy: Secondary | ICD-10-CM | POA: Diagnosis not present

## 2024-04-04 DIAGNOSIS — R42 Dizziness and giddiness: Secondary | ICD-10-CM | POA: Diagnosis not present

## 2024-04-04 DIAGNOSIS — I10 Essential (primary) hypertension: Secondary | ICD-10-CM | POA: Diagnosis not present

## 2024-04-04 DIAGNOSIS — G9341 Metabolic encephalopathy: Secondary | ICD-10-CM | POA: Diagnosis not present

## 2024-04-09 DIAGNOSIS — J3089 Other allergic rhinitis: Secondary | ICD-10-CM | POA: Diagnosis not present

## 2024-04-09 DIAGNOSIS — R519 Headache, unspecified: Secondary | ICD-10-CM | POA: Diagnosis not present

## 2024-04-09 DIAGNOSIS — G629 Polyneuropathy, unspecified: Secondary | ICD-10-CM | POA: Diagnosis not present

## 2024-04-12 DIAGNOSIS — R519 Headache, unspecified: Secondary | ICD-10-CM | POA: Diagnosis not present

## 2024-04-12 DIAGNOSIS — J3089 Other allergic rhinitis: Secondary | ICD-10-CM | POA: Diagnosis not present

## 2024-04-12 DIAGNOSIS — R42 Dizziness and giddiness: Secondary | ICD-10-CM | POA: Diagnosis not present

## 2024-04-14 DIAGNOSIS — G9341 Metabolic encephalopathy: Secondary | ICD-10-CM | POA: Diagnosis not present

## 2024-04-14 DIAGNOSIS — I10 Essential (primary) hypertension: Secondary | ICD-10-CM | POA: Diagnosis not present

## 2024-04-15 DIAGNOSIS — I10 Essential (primary) hypertension: Secondary | ICD-10-CM | POA: Diagnosis not present

## 2024-04-15 DIAGNOSIS — Z8673 Personal history of transient ischemic attack (TIA), and cerebral infarction without residual deficits: Secondary | ICD-10-CM | POA: Diagnosis not present

## 2024-04-15 DIAGNOSIS — Z79899 Other long term (current) drug therapy: Secondary | ICD-10-CM | POA: Diagnosis not present

## 2024-04-15 DIAGNOSIS — R42 Dizziness and giddiness: Secondary | ICD-10-CM | POA: Diagnosis not present

## 2024-04-15 DIAGNOSIS — G629 Polyneuropathy, unspecified: Secondary | ICD-10-CM | POA: Diagnosis not present

## 2024-04-15 DIAGNOSIS — K219 Gastro-esophageal reflux disease without esophagitis: Secondary | ICD-10-CM | POA: Diagnosis not present

## 2024-04-15 DIAGNOSIS — R519 Headache, unspecified: Secondary | ICD-10-CM | POA: Diagnosis not present

## 2024-06-17 DIAGNOSIS — Z853 Personal history of malignant neoplasm of breast: Secondary | ICD-10-CM | POA: Diagnosis not present

## 2024-06-17 DIAGNOSIS — G9341 Metabolic encephalopathy: Secondary | ICD-10-CM | POA: Diagnosis not present

## 2024-06-17 DIAGNOSIS — E46 Unspecified protein-calorie malnutrition: Secondary | ICD-10-CM | POA: Diagnosis not present

## 2024-06-17 DIAGNOSIS — I10 Essential (primary) hypertension: Secondary | ICD-10-CM | POA: Diagnosis not present

## 2024-06-17 DIAGNOSIS — R4189 Other symptoms and signs involving cognitive functions and awareness: Secondary | ICD-10-CM | POA: Diagnosis not present
